# Patient Record
Sex: Male | Born: 1960
Health system: Southern US, Community
[De-identification: ages and names within clinical notes are randomized; demographics above are authoritative.]

## PROBLEM LIST (undated history)

## (undated) DIAGNOSIS — J449 Chronic obstructive pulmonary disease, unspecified: Secondary | ICD-10-CM

## (undated) DIAGNOSIS — I5032 Chronic diastolic (congestive) heart failure: Secondary | ICD-10-CM

## (undated) DIAGNOSIS — I219 Acute myocardial infarction, unspecified: Secondary | ICD-10-CM

## (undated) DIAGNOSIS — R519 Headache, unspecified: Secondary | ICD-10-CM

## (undated) DIAGNOSIS — R51 Headache: Secondary | ICD-10-CM

## (undated) DIAGNOSIS — I2510 Atherosclerotic heart disease of native coronary artery without angina pectoris: Secondary | ICD-10-CM

## (undated) DIAGNOSIS — M1990 Unspecified osteoarthritis, unspecified site: Secondary | ICD-10-CM

## (undated) DIAGNOSIS — R079 Chest pain, unspecified: Secondary | ICD-10-CM

## (undated) DIAGNOSIS — I82409 Acute embolism and thrombosis of unspecified deep veins of unspecified lower extremity: Secondary | ICD-10-CM

## (undated) DIAGNOSIS — C4492 Squamous cell carcinoma of skin, unspecified: Secondary | ICD-10-CM

## (undated) DIAGNOSIS — E7800 Pure hypercholesterolemia, unspecified: Secondary | ICD-10-CM

## (undated) DIAGNOSIS — B1920 Unspecified viral hepatitis C without hepatic coma: Secondary | ICD-10-CM

## (undated) DIAGNOSIS — F1410 Cocaine abuse, uncomplicated: Secondary | ICD-10-CM

## (undated) DIAGNOSIS — J189 Pneumonia, unspecified organism: Secondary | ICD-10-CM

## (undated) DIAGNOSIS — IMO0001 Reserved for inherently not codable concepts without codable children: Secondary | ICD-10-CM

## (undated) DIAGNOSIS — I10 Essential (primary) hypertension: Secondary | ICD-10-CM

## (undated) DIAGNOSIS — Z720 Tobacco use: Secondary | ICD-10-CM

## (undated) DIAGNOSIS — I2699 Other pulmonary embolism without acute cor pulmonale: Secondary | ICD-10-CM

## (undated) HISTORY — PX: VENA CAVA FILTER PLACEMENT: SUR1032

## (undated) HISTORY — DX: Squamous cell carcinoma of skin, unspecified: C44.92

## (undated) HISTORY — PX: FOOT SURGERY: SHX648

---

## 1999-10-18 ENCOUNTER — Emergency Department (HOSPITAL_COMMUNITY): Admission: EM | Admit: 1999-10-18 | Discharge: 1999-10-18 | Payer: Self-pay | Admitting: *Deleted

## 2000-07-03 ENCOUNTER — Inpatient Hospital Stay (HOSPITAL_COMMUNITY): Admission: EM | Admit: 2000-07-03 | Discharge: 2000-07-08 | Payer: Self-pay | Admitting: Emergency Medicine

## 2000-07-03 ENCOUNTER — Encounter (HOSPITAL_BASED_OUTPATIENT_CLINIC_OR_DEPARTMENT_OTHER): Payer: Self-pay | Admitting: Internal Medicine

## 2000-07-03 ENCOUNTER — Encounter: Payer: Self-pay | Admitting: Emergency Medicine

## 2000-07-04 ENCOUNTER — Encounter (HOSPITAL_BASED_OUTPATIENT_CLINIC_OR_DEPARTMENT_OTHER): Payer: Self-pay | Admitting: Internal Medicine

## 2000-10-04 ENCOUNTER — Encounter: Payer: Self-pay | Admitting: Internal Medicine

## 2000-10-04 ENCOUNTER — Inpatient Hospital Stay (HOSPITAL_COMMUNITY): Admission: EM | Admit: 2000-10-04 | Discharge: 2000-10-10 | Payer: Self-pay | Admitting: Internal Medicine

## 2000-10-04 ENCOUNTER — Encounter: Payer: Self-pay | Admitting: Emergency Medicine

## 2003-12-29 ENCOUNTER — Inpatient Hospital Stay (HOSPITAL_COMMUNITY): Admission: EM | Admit: 2003-12-29 | Discharge: 2003-12-31 | Payer: Self-pay | Admitting: Emergency Medicine

## 2004-06-23 ENCOUNTER — Emergency Department (HOSPITAL_COMMUNITY): Admission: EM | Admit: 2004-06-23 | Discharge: 2004-06-23 | Payer: Self-pay | Admitting: Emergency Medicine

## 2004-07-12 ENCOUNTER — Ambulatory Visit: Payer: Self-pay | Admitting: Internal Medicine

## 2004-07-12 ENCOUNTER — Ambulatory Visit: Payer: Self-pay | Admitting: Family Medicine

## 2004-07-13 ENCOUNTER — Ambulatory Visit: Payer: Self-pay | Admitting: *Deleted

## 2004-09-21 ENCOUNTER — Emergency Department (HOSPITAL_COMMUNITY): Admission: EM | Admit: 2004-09-21 | Discharge: 2004-09-21 | Payer: Self-pay | Admitting: Emergency Medicine

## 2004-09-28 ENCOUNTER — Ambulatory Visit: Payer: Self-pay | Admitting: Family Medicine

## 2004-09-28 ENCOUNTER — Inpatient Hospital Stay (HOSPITAL_COMMUNITY): Admission: EM | Admit: 2004-09-28 | Discharge: 2004-09-29 | Payer: Self-pay | Admitting: Emergency Medicine

## 2004-11-24 ENCOUNTER — Ambulatory Visit: Payer: Self-pay | Admitting: Family Medicine

## 2004-12-28 ENCOUNTER — Ambulatory Visit: Payer: Self-pay | Admitting: Family Medicine

## 2005-01-04 ENCOUNTER — Ambulatory Visit: Payer: Self-pay | Admitting: Family Medicine

## 2005-02-20 ENCOUNTER — Ambulatory Visit: Payer: Self-pay | Admitting: Family Medicine

## 2005-04-11 ENCOUNTER — Ambulatory Visit: Payer: Self-pay | Admitting: Internal Medicine

## 2005-08-18 ENCOUNTER — Ambulatory Visit: Payer: Self-pay | Admitting: Family Medicine

## 2006-02-07 ENCOUNTER — Ambulatory Visit: Payer: Self-pay | Admitting: Family Medicine

## 2006-05-28 ENCOUNTER — Ambulatory Visit: Payer: Self-pay | Admitting: Family Medicine

## 2006-09-30 ENCOUNTER — Emergency Department (HOSPITAL_COMMUNITY): Admission: EM | Admit: 2006-09-30 | Discharge: 2006-10-01 | Payer: Self-pay | Admitting: Emergency Medicine

## 2006-10-10 ENCOUNTER — Ambulatory Visit: Payer: Self-pay | Admitting: Family Medicine

## 2006-10-10 ENCOUNTER — Emergency Department (HOSPITAL_COMMUNITY): Admission: EM | Admit: 2006-10-10 | Discharge: 2006-10-11 | Payer: Self-pay | Admitting: Emergency Medicine

## 2006-10-15 ENCOUNTER — Ambulatory Visit: Payer: Self-pay | Admitting: Internal Medicine

## 2006-10-15 LAB — CONVERTED CEMR LAB
AST: 50 units/L — ABNORMAL HIGH (ref 0–37)
Albumin: 3.9 g/dL (ref 3.5–5.2)
Alkaline Phosphatase: 81 units/L (ref 39–117)
Basophils Absolute: 0 10*3/uL (ref 0.0–0.1)
CO2: 24 meq/L (ref 19–32)
Eosinophils Absolute: 0.4 10*3/uL (ref 0.0–0.7)
Eosinophils Relative: 8 % — ABNORMAL HIGH (ref 0–5)
Glucose, Bld: 80 mg/dL (ref 70–99)
Lymphocytes Relative: 28 % (ref 12–46)
Lymphs Abs: 1.4 10*3/uL (ref 0.7–3.3)
MCHC: 32.5 g/dL (ref 30.0–36.0)
Monocytes Absolute: 0.8 10*3/uL — ABNORMAL HIGH (ref 0.2–0.7)
Monocytes Relative: 17 % — ABNORMAL HIGH (ref 3–11)
Neutro Abs: 2.4 10*3/uL (ref 1.7–7.7)
Neutrophils Relative %: 48 % (ref 43–77)
Platelets: 152 10*3/uL (ref 150–400)
RDW: 14.8 % — ABNORMAL HIGH (ref 11.5–14.0)
Total Protein: 6.9 g/dL (ref 6.0–8.3)

## 2006-10-22 ENCOUNTER — Emergency Department (HOSPITAL_COMMUNITY): Admission: EM | Admit: 2006-10-22 | Discharge: 2006-10-22 | Payer: Self-pay | Admitting: Emergency Medicine

## 2006-10-22 ENCOUNTER — Ambulatory Visit: Payer: Self-pay | Admitting: Vascular Surgery

## 2006-10-29 ENCOUNTER — Emergency Department (HOSPITAL_COMMUNITY): Admission: EM | Admit: 2006-10-29 | Discharge: 2006-10-29 | Payer: Self-pay | Admitting: Emergency Medicine

## 2006-11-02 ENCOUNTER — Ambulatory Visit: Payer: Self-pay | Admitting: Family Medicine

## 2006-11-07 ENCOUNTER — Ambulatory Visit: Payer: Self-pay | Admitting: Internal Medicine

## 2006-11-13 ENCOUNTER — Ambulatory Visit: Payer: Self-pay | Admitting: Internal Medicine

## 2006-11-27 ENCOUNTER — Ambulatory Visit: Payer: Self-pay | Admitting: Internal Medicine

## 2006-11-28 ENCOUNTER — Encounter: Payer: Self-pay | Admitting: Internal Medicine

## 2006-11-28 ENCOUNTER — Ambulatory Visit: Payer: Self-pay | Admitting: Surgery

## 2006-11-28 ENCOUNTER — Emergency Department (HOSPITAL_COMMUNITY): Admission: EM | Admit: 2006-11-28 | Discharge: 2006-11-28 | Payer: Self-pay | Admitting: *Deleted

## 2006-11-28 ENCOUNTER — Ambulatory Visit (HOSPITAL_COMMUNITY): Admission: RE | Admit: 2006-11-28 | Discharge: 2006-11-28 | Payer: Self-pay | Admitting: Internal Medicine

## 2006-12-12 ENCOUNTER — Encounter (INDEPENDENT_AMBULATORY_CARE_PROVIDER_SITE_OTHER): Payer: Self-pay | Admitting: *Deleted

## 2007-01-10 ENCOUNTER — Ambulatory Visit: Payer: Self-pay | Admitting: Internal Medicine

## 2007-02-08 ENCOUNTER — Ambulatory Visit: Payer: Self-pay | Admitting: Internal Medicine

## 2007-04-04 ENCOUNTER — Encounter (INDEPENDENT_AMBULATORY_CARE_PROVIDER_SITE_OTHER): Payer: Self-pay | Admitting: Emergency Medicine

## 2007-04-04 ENCOUNTER — Ambulatory Visit: Payer: Self-pay | Admitting: *Deleted

## 2007-04-04 ENCOUNTER — Inpatient Hospital Stay (HOSPITAL_COMMUNITY): Admission: EM | Admit: 2007-04-04 | Discharge: 2007-04-09 | Payer: Self-pay | Admitting: Emergency Medicine

## 2007-04-30 ENCOUNTER — Ambulatory Visit: Payer: Self-pay | Admitting: Internal Medicine

## 2007-04-30 ENCOUNTER — Encounter: Payer: Self-pay | Admitting: Internal Medicine

## 2007-04-30 LAB — CONVERTED CEMR LAB
AST: 38 units/L — ABNORMAL HIGH (ref 0–37)
Albumin: 4 g/dL (ref 3.5–5.2)
BUN: 10 mg/dL (ref 6–23)
Basophils Absolute: 0 10*3/uL (ref 0.0–0.1)
Basophils Relative: 0 % (ref 0–1)
Calcium: 9.1 mg/dL (ref 8.4–10.5)
Chloride: 104 meq/L (ref 96–112)
Cholesterol: 242 mg/dL — ABNORMAL HIGH (ref 0–200)
Creatinine, Ser: 0.73 mg/dL (ref 0.40–1.50)
Eosinophils Relative: 6 % — ABNORMAL HIGH (ref 0–5)
Lymphocytes Relative: 24 % (ref 12–46)
MCV: 91.2 fL (ref 78.0–100.0)
Monocytes Absolute: 0.8 10*3/uL (ref 0.1–1.0)
Platelets: 151 10*3/uL (ref 150–400)
Total Bilirubin: 0.5 mg/dL (ref 0.3–1.2)
Total CHOL/HDL Ratio: 4.8
Triglycerides: 101 mg/dL (ref <150)
VLDL: 20 mg/dL (ref 0–40)
WBC: 3.3 10*3/uL — ABNORMAL LOW (ref 4.0–10.5)

## 2007-05-02 ENCOUNTER — Emergency Department (HOSPITAL_COMMUNITY): Admission: EM | Admit: 2007-05-02 | Discharge: 2007-05-02 | Payer: Self-pay | Admitting: Emergency Medicine

## 2007-06-04 ENCOUNTER — Emergency Department (HOSPITAL_COMMUNITY): Admission: EM | Admit: 2007-06-04 | Discharge: 2007-06-04 | Payer: Self-pay | Admitting: Cardiovascular Disease

## 2007-06-12 ENCOUNTER — Ambulatory Visit: Payer: Self-pay | Admitting: Family Medicine

## 2007-06-24 ENCOUNTER — Ambulatory Visit: Payer: Self-pay | Admitting: Internal Medicine

## 2007-07-01 ENCOUNTER — Ambulatory Visit: Payer: Self-pay | Admitting: Internal Medicine

## 2007-07-16 ENCOUNTER — Ambulatory Visit: Payer: Self-pay | Admitting: Internal Medicine

## 2007-08-12 ENCOUNTER — Ambulatory Visit: Payer: Self-pay | Admitting: Internal Medicine

## 2007-08-21 ENCOUNTER — Ambulatory Visit: Payer: Self-pay | Admitting: Internal Medicine

## 2007-08-24 ENCOUNTER — Emergency Department (HOSPITAL_COMMUNITY): Admission: EM | Admit: 2007-08-24 | Discharge: 2007-08-24 | Payer: Self-pay | Admitting: Emergency Medicine

## 2007-08-30 ENCOUNTER — Emergency Department (HOSPITAL_COMMUNITY): Admission: EM | Admit: 2007-08-30 | Discharge: 2007-08-31 | Payer: Self-pay | Admitting: Emergency Medicine

## 2007-10-20 ENCOUNTER — Emergency Department (HOSPITAL_COMMUNITY): Admission: EM | Admit: 2007-10-20 | Discharge: 2007-10-20 | Payer: Self-pay | Admitting: Emergency Medicine

## 2008-02-14 ENCOUNTER — Emergency Department (HOSPITAL_COMMUNITY): Admission: EM | Admit: 2008-02-14 | Discharge: 2008-02-14 | Payer: Self-pay | Admitting: Emergency Medicine

## 2008-03-08 ENCOUNTER — Emergency Department (HOSPITAL_COMMUNITY): Admission: EM | Admit: 2008-03-08 | Discharge: 2008-03-08 | Payer: Self-pay | Admitting: Emergency Medicine

## 2008-03-30 ENCOUNTER — Emergency Department (HOSPITAL_COMMUNITY): Admission: EM | Admit: 2008-03-30 | Discharge: 2008-03-30 | Payer: Self-pay | Admitting: Emergency Medicine

## 2008-04-27 ENCOUNTER — Emergency Department (HOSPITAL_COMMUNITY): Admission: EM | Admit: 2008-04-27 | Discharge: 2008-04-27 | Payer: Self-pay | Admitting: Emergency Medicine

## 2008-05-04 ENCOUNTER — Emergency Department (HOSPITAL_COMMUNITY): Admission: EM | Admit: 2008-05-04 | Discharge: 2008-05-04 | Payer: Self-pay | Admitting: Emergency Medicine

## 2008-05-10 ENCOUNTER — Emergency Department (HOSPITAL_COMMUNITY): Admission: EM | Admit: 2008-05-10 | Discharge: 2008-05-10 | Payer: Self-pay | Admitting: Emergency Medicine

## 2008-05-15 ENCOUNTER — Observation Stay (HOSPITAL_COMMUNITY): Admission: EM | Admit: 2008-05-15 | Discharge: 2008-05-15 | Payer: Self-pay | Admitting: Emergency Medicine

## 2008-07-12 ENCOUNTER — Observation Stay (HOSPITAL_COMMUNITY): Admission: EM | Admit: 2008-07-12 | Discharge: 2008-07-12 | Payer: Self-pay | Admitting: Emergency Medicine

## 2008-11-23 ENCOUNTER — Emergency Department (HOSPITAL_COMMUNITY): Admission: EM | Admit: 2008-11-23 | Discharge: 2008-11-23 | Payer: Self-pay | Admitting: Emergency Medicine

## 2008-12-03 ENCOUNTER — Inpatient Hospital Stay (HOSPITAL_COMMUNITY): Admission: EM | Admit: 2008-12-03 | Discharge: 2008-12-04 | Payer: Self-pay | Admitting: Emergency Medicine

## 2008-12-18 ENCOUNTER — Emergency Department (HOSPITAL_COMMUNITY): Admission: EM | Admit: 2008-12-18 | Discharge: 2008-12-18 | Payer: Self-pay | Admitting: Emergency Medicine

## 2008-12-27 ENCOUNTER — Emergency Department (HOSPITAL_COMMUNITY): Admission: EM | Admit: 2008-12-27 | Discharge: 2008-12-27 | Payer: Self-pay | Admitting: Emergency Medicine

## 2009-03-11 ENCOUNTER — Inpatient Hospital Stay (HOSPITAL_COMMUNITY): Admission: EM | Admit: 2009-03-11 | Discharge: 2009-03-15 | Payer: Self-pay | Admitting: Emergency Medicine

## 2009-05-20 ENCOUNTER — Emergency Department (HOSPITAL_COMMUNITY): Admission: EM | Admit: 2009-05-20 | Discharge: 2009-05-20 | Payer: Self-pay | Admitting: Emergency Medicine

## 2009-06-03 ENCOUNTER — Inpatient Hospital Stay (HOSPITAL_COMMUNITY): Admission: EM | Admit: 2009-06-03 | Discharge: 2009-06-09 | Payer: Self-pay | Admitting: Emergency Medicine

## 2009-07-05 ENCOUNTER — Inpatient Hospital Stay (HOSPITAL_COMMUNITY): Admission: EM | Admit: 2009-07-05 | Discharge: 2009-07-08 | Payer: Self-pay | Admitting: Emergency Medicine

## 2009-09-28 ENCOUNTER — Inpatient Hospital Stay (HOSPITAL_COMMUNITY): Admission: EM | Admit: 2009-09-28 | Discharge: 2009-10-02 | Payer: Self-pay | Admitting: Emergency Medicine

## 2009-12-15 ENCOUNTER — Ambulatory Visit: Payer: Self-pay | Admitting: Cardiology

## 2009-12-16 ENCOUNTER — Inpatient Hospital Stay (HOSPITAL_COMMUNITY): Admission: EM | Admit: 2009-12-16 | Discharge: 2009-12-19 | Payer: Self-pay | Admitting: Emergency Medicine

## 2010-01-10 ENCOUNTER — Emergency Department (HOSPITAL_COMMUNITY)
Admission: EM | Admit: 2010-01-10 | Discharge: 2010-01-10 | Payer: Self-pay | Source: Home / Self Care | Admitting: Emergency Medicine

## 2010-01-10 ENCOUNTER — Ambulatory Visit: Payer: Self-pay | Admitting: Vascular Surgery

## 2010-01-10 ENCOUNTER — Encounter (INDEPENDENT_AMBULATORY_CARE_PROVIDER_SITE_OTHER): Payer: Self-pay | Admitting: Emergency Medicine

## 2010-01-14 ENCOUNTER — Ambulatory Visit: Payer: Self-pay | Admitting: Internal Medicine

## 2010-01-17 ENCOUNTER — Encounter (INDEPENDENT_AMBULATORY_CARE_PROVIDER_SITE_OTHER): Payer: Self-pay | Admitting: Family Medicine

## 2010-01-17 LAB — CONVERTED CEMR LAB
INR: 1.22 (ref <1.50)
Prothrombin Time: 15.6 s — ABNORMAL HIGH (ref 11.6–15.2)

## 2010-02-05 ENCOUNTER — Emergency Department (HOSPITAL_COMMUNITY): Admission: EM | Admit: 2010-02-05 | Discharge: 2010-02-05 | Payer: Self-pay | Admitting: Emergency Medicine

## 2010-02-18 ENCOUNTER — Inpatient Hospital Stay (HOSPITAL_COMMUNITY): Admission: EM | Admit: 2010-02-18 | Discharge: 2010-02-20 | Payer: Self-pay | Admitting: Emergency Medicine

## 2010-03-24 ENCOUNTER — Inpatient Hospital Stay (HOSPITAL_COMMUNITY)
Admission: EM | Admit: 2010-03-24 | Discharge: 2010-03-29 | Payer: Self-pay | Source: Home / Self Care | Attending: Internal Medicine | Admitting: Internal Medicine

## 2010-03-24 ENCOUNTER — Encounter (INDEPENDENT_AMBULATORY_CARE_PROVIDER_SITE_OTHER): Payer: Self-pay | Admitting: Emergency Medicine

## 2010-04-24 ENCOUNTER — Inpatient Hospital Stay (HOSPITAL_COMMUNITY)
Admission: EM | Admit: 2010-04-24 | Discharge: 2010-04-28 | DRG: 192 | Disposition: A | Discharge status: Home / Self Care | Payer: Medicaid Other | Attending: Internal Medicine | Admitting: Internal Medicine

## 2010-04-24 DIAGNOSIS — R7309 Other abnormal glucose: Secondary | ICD-10-CM | POA: Diagnosis present

## 2010-04-24 DIAGNOSIS — Z86718 Personal history of other venous thrombosis and embolism: Secondary | ICD-10-CM

## 2010-04-24 DIAGNOSIS — F17200 Nicotine dependence, unspecified, uncomplicated: Secondary | ICD-10-CM | POA: Diagnosis present

## 2010-04-24 DIAGNOSIS — IMO0002 Reserved for concepts with insufficient information to code with codable children: Secondary | ICD-10-CM

## 2010-04-24 DIAGNOSIS — T380X5A Adverse effect of glucocorticoids and synthetic analogues, initial encounter: Secondary | ICD-10-CM | POA: Diagnosis present

## 2010-04-24 DIAGNOSIS — J471 Bronchiectasis with (acute) exacerbation: Principal | ICD-10-CM | POA: Diagnosis present

## 2010-04-24 DIAGNOSIS — Z79899 Other long term (current) drug therapy: Secondary | ICD-10-CM

## 2010-04-24 DIAGNOSIS — Y92009 Unspecified place in unspecified non-institutional (private) residence as the place of occurrence of the external cause: Secondary | ICD-10-CM

## 2010-04-24 DIAGNOSIS — Z7901 Long term (current) use of anticoagulants: Secondary | ICD-10-CM

## 2010-04-24 DIAGNOSIS — Z7982 Long term (current) use of aspirin: Secondary | ICD-10-CM

## 2010-04-24 DIAGNOSIS — Z9119 Patient's noncompliance with other medical treatment and regimen: Secondary | ICD-10-CM

## 2010-04-24 DIAGNOSIS — Z91199 Patient's noncompliance with other medical treatment and regimen due to unspecified reason: Secondary | ICD-10-CM

## 2010-04-24 LAB — BASIC METABOLIC PANEL
BUN: 9 mg/dL (ref 6–23)
CO2: 25 mEq/L (ref 19–32)
Calcium: 9.3 mg/dL (ref 8.4–10.5)
Chloride: 109 mEq/L (ref 96–112)
Creatinine, Ser: 1.09 mg/dL (ref 0.4–1.5)
GFR calc Af Amer: >60 mL/min (ref >60)
GFR calc non Af Amer: >60 mL/min (ref >60)
Glucose, Bld: 122 mg/dL — ABNORMAL HIGH (ref 70–99)
Potassium: 4 mEq/L (ref 3.5–5.1)
Sodium: 143 mEq/L (ref 135–145)

## 2010-04-24 LAB — CBC
HCT: 40.4 % (ref 39.0–52.0)
Platelets: 169 10*3/uL (ref 150–400)
RDW: 12.8 % (ref 11.5–15.5)
WBC: 4.3 10*3/uL (ref 4.0–10.5)

## 2010-04-24 LAB — APTT: aPTT: 25 seconds (ref 24–37)

## 2010-04-24 LAB — DIFFERENTIAL
Basophils Absolute: 0 10*3/uL (ref 0.0–0.1)
Eosinophils Relative: 10 % — ABNORMAL HIGH (ref 0–5)
Lymphocytes Relative: 36 % (ref 12–46)

## 2010-04-24 LAB — PROTIME-INR
INR: 0.94 (ref 0.00–1.49)
Prothrombin Time: 12.8 seconds (ref 11.6–15.2)

## 2010-04-25 LAB — COMPREHENSIVE METABOLIC PANEL
ALT: 33 U/L (ref 0–53)
AST: 46 U/L — ABNORMAL HIGH (ref 0–37)
Alkaline Phosphatase: 84 U/L (ref 39–117)
CO2: 20 mEq/L (ref 19–32)
Calcium: 9.1 mg/dL (ref 8.4–10.5)
Chloride: 106 mEq/L (ref 96–112)
GFR calc Af Amer: >60 mL/min (ref >60)
GFR calc non Af Amer: >60 mL/min (ref >60)
Potassium: 3.7 mEq/L (ref 3.5–5.1)
Sodium: 139 mEq/L (ref 135–145)

## 2010-04-25 LAB — BLOOD GAS, ARTERIAL
Allens test (pass/fail): POSITIVE — AB
Bicarbonate: 21.2 mEq/L (ref 20.0–24.0)
FIO2: 0.21 %
pH, Arterial: 7.397 (ref 7.350–7.450)
pO2, Arterial: 71.9 mmHg — ABNORMAL LOW (ref 80.0–100.0)

## 2010-04-25 LAB — CBC
Hemoglobin: 13.1 g/dL (ref 13.0–17.0)
MCHC: 33.4 g/dL (ref 30.0–36.0)
RBC: 4.37 MIL/uL (ref 4.22–5.81)
WBC: 3.3 10*3/uL — ABNORMAL LOW (ref 4.0–10.5)

## 2010-04-25 LAB — RAPID URINE DRUG SCREEN, HOSP PERFORMED: Benzodiazepines: NOT DETECTED

## 2010-04-26 LAB — PROTIME-INR
INR: 0.96 (ref 0.00–1.49)
Prothrombin Time: 13 seconds (ref 11.6–15.2)

## 2010-04-26 LAB — GLUCOSE, CAPILLARY: Glucose-Capillary: 231 mg/dL — ABNORMAL HIGH (ref 70–99)

## 2010-04-27 LAB — GLUCOSE, CAPILLARY
Glucose-Capillary: 214 mg/dL — ABNORMAL HIGH (ref 70–99)
Glucose-Capillary: 170 mg/dL — ABNORMAL HIGH (ref 70–99)

## 2010-04-28 LAB — GLUCOSE, CAPILLARY: Glucose-Capillary: 214 mg/dL — ABNORMAL HIGH (ref 70–99)

## 2010-04-28 LAB — PROTIME-INR: Prothrombin Time: 17.3 seconds — ABNORMAL HIGH (ref 11.6–15.2)

## 2010-04-30 NOTE — Discharge Summary (Signed)
[name], [name]             ACCOUNT NO.:  [id number]  MEDICAL RECORD NO.:  [id number]           PATIENT TYPE:  I  LOCATION:  5525                         FACILITY:  [facility]  PHYSICIAN:  Charlestine Massed, MDDATE OF BIRTH:  01/29/1961  DATE OF ADMISSION:  04/24/2010 DATE OF DISCHARGE:  04/28/2010                        DISCHARGE SUMMARY - REFERRING   PRIMARY CARE PHYSICIAN:  Dr. Clelia Croft at [facility].  CHIEF COMPLAINT:  Shortness of breath.  DISCHARGE DIAGNOSES: 1. Chronic obstructive pulmonary disease exacerbation - currently     stable. 2. History of recurrent DVT - totally noncompliant with Coumadin -     status post IVC filter. 3. Hyperglycemia due to steroid. 4. Bilateral basal bronchiectasis.  DISCHARGE MEDICATIONS: 1. Albuterol nebulizer 1 unit q.6 hourly p.r.n. along with Atrovent     nebulizer 1 unit q.6 hourly p.r.n. 2. Enteric-coated aspirin 81 mg p.o. daily. 3. Vantin 200 mg p.o. b.i.d. for 12 more days. 4. Ciprofloxacin 500 mg p.o. b.i.d. for 12 more days. 5. Lovenox 85 mg q.12 hourly for another 4 days with bridging with     Coumadin. 6. Coumadin 10 mg p.o. daily and to be taken until the dosage is     changed by the MD. 7. Mucinex 600 mg p.o. at b.i.d. 8. Prilosec 20 mg p.o. daily in a.m. 9. Prednisone tapering dose over the next few days to a basal dose of     10 mg per day. 10.Advair Diskus 250/50 one puff b.i.d. 11.Albuterol inhaler for outdoor use.  HOSPITAL COURSE:  [name] is a 50 year old gentleman who has been totally noncompliant with his Coumadin for his recurrent DVT.  He has been admitted for COPD exacerbation.  He has existing bilateral basal bronchiectasis.  Even though, he denies very much that he is not smoking anymore, but he has a significant past history of smoking too.  He was observed closely with nebulizers and steroids.  His condition has improved considerably.  He has been restarted on his medications  with steroids and nebulizers.  He has been started on Lovenox and Coumadin. He has agreed that he will take his medications regularly.  We have started him on ciprofloxacin for Pseudomonas coverage due to bronchiectasis and Vantin to be continued for a total of 14-day course to eliminate the infection due to the presence of bronchiectasis.  We have ensured that he will get home care with nebulizers supplied with treatment which will prevent further rehospitalization for the same issues.  He is currently doing a lot better and ambulating in the hallway without oxygen and he is found to be not desaturating and he is ready for discharge at this time.  DISPOSITION:  Discharged back home, followup.  OBJECTIVE:  GENERAL:  On exam today, not in any distress, feeling a lot better, afebrile.  The patient is awake and alert. VITAL SIGNS: Temperature 98.0, heart rate 69, respiration 20, blood pressure 149/97, O2 sat 98% on room air. HEAD AND NECK:  No JVD.  No bruit.  No thrush. CHEST:  Bilateral harsh breath sounds posteriorly and coarse rales on the right side, more on the left side.  No wheeze.  CARDIAC:  S1 and S2, regular.  No murmurs. ABDOMEN:  Soft, nontender.  No organomegaly. EXTREMITIES:  No pedal edema.  LABORATORY DATA:  INR today is 1.39.  Hemoglobin A1c is 5.9.  Urine drug screen at the time of admission negative for all drugs tested.  ASSESSMENT AND PLAN:  As dictated above.  A total of 40 minutes spent on the discharge.     Charlestine Massed, MD     UT/MEDQ  D:  04/28/2010  T:  04/28/2010  Job:  [id number]  cc:   Dr. Clelia Croft  Electronically Signed by Charlestine Massed MD on 04/30/2010 11:21:30 AM

## 2010-06-02 NOTE — H&P (Signed)
NAMEWILBUR, [name]             ACCOUNT NO.:  [id number]  MEDICAL RECORD NO.:  [id number]          PATIENT TYPE:  EMS  LOCATION:  MAJO                         FACILITY:  [facility]  PHYSICIAN:  Lonia Blood, M.D.      DATE OF BIRTH:  [birth year]  DATE OF ADMISSION:  04/24/2010 DATE OF DISCHARGE:                             HISTORY & PHYSICAL   PRIMARY CARE PHYSICIAN:  [name] goes to [facility]  PRESENTING COMPLAINT:  Shortness of breath.  HISTORY OF PRESENT ILLNESS:  The patient is a 50 year old gentleman with known history of asthma for the past 15 years who has had multiple hospitalizations almost every month.  His last hospitalization was from March 24, 2010, to March 30, 2010.  [name] came in today with worsening shortness of breath.  [name] apparently has been out of his medications.  [name] takes Coumadin also that [name] has been out of for 2 days.  The patient denied any recent cold.  Denied any fever.  [name] has cough associated withthe shortness of breath otherwise.  The patient also has had some dizziness or lightheadedness during his coughing spells.  No sick contacts.  PAST MEDICAL HISTORY:  Asthma and recurrent DVTs.  ALLERGIES:  No known drug allergies.  MEDICATIONS:  Coumadin which [name] ran out of, albuterol, and prednisone.  SOCIAL HISTORY:  The patient lives in [place].  [name] is disabled.  [name] denied tobacco.  Occasional alcohol.  Denied any IV drug use.  FAMILY HISTORY:  Denied any significant family history.  PAST SURGICAL HISTORY:  Placement of IVC filter.  REVIEW OF SYSTEMS:  All systems are reviewed and are negative except per HPI.  PHYSICAL EXAMINATION:  VITAL SIGNS:  Temperature 98.1, blood pressure 114/85 with a pulse of 86, respiratory rate 16, and sats 94% on 2 liters. GENERAL:  [name] is awake, alert, and oriented, in obvious respiratory distress, using extra muscle of respiration. HEENT:  PERRL.  EOMI.  No pallor.  No jaundice.  No rhinorrhea. NECK:  Supple.  No  JVD.  No lymphadenopathy. RESPIRATORY:  [name] has good air entry bilaterally.  No wheezes.  No rales. No crackles. CARDIOVASCULAR:  [name] has S1 and S2.  No audible murmur. ABDOMEN:  Soft, full, and nontender with positive bowel sounds. EXTREMITIES:  No edema, cyanosis, or clubbing. SKIN:  No rashes.  No ulcers. MUSCULOSKELETAL:  No joint swelling, tenderness, or deformity.  LABORATORY DATA:  Sodium of 143, potassium 4.0, chloride 109, CO2 of 25, glucose 122, BUN 9, creatinine 1.09, and calcium 9.3.  His PTT is 25, PT 12.8, and INR 0.94.  White count 4.3, hemoglobin 13.5, and platelet 169. His chest x-ray showed chronic bronchitic change without acute cardiopulmonary disease.  ASSESSMENT:  This is a 50 year old gentleman with known history of asthma presenting with what appears to be acute eczema exacerbation.  [name] is wheezing continuously despite 3 treatments with albuterol and Atrovent.  Our plan at this point will be: 1. Asthma exacerbation:  We will admit the patient and start him on IV     steroids and albuterol and Atrovent nebulizer.  Also put him on     some  Mucinex for cough suppression.  [name] will be on oxygen and     follow up closely.  For the first 24 hours, we will put him on tele     and subsequently regular.  The patient seems to be noncompliant     with his medication based on his history.  [name] will need to be     transitioned to oral steroids and also continued with Advair Diskus     which [name] has not been taking. 2. History of recurrent DVT:  Again, this is on and off.  [name] is off     his Coumadin and is subtherapeutic.  I will restart his Coumadin     and probably do some heparin as a backup for bridging.  Low-     molecular-weight heparin may be the best option here using Lovenox     and this is due to his multiple recurrent DVTs.  [name] has had at     least 5 episodes in the past. 3. Hyperglycemia, probably from prior steroid use: 4. History of bronchiectasis from prior CT  scan:  This may be     contributing to the patient's exacerbation.     Lonia Blood, M.D.     Verlin Grills  D:  04/25/2010  T:  04/25/2010  Job:  [id number]  Electronically Signed by Lonia Blood M.D. on 06/01/2010 04:15:48 PM

## 2010-06-06 LAB — GLUCOSE, CAPILLARY
Glucose-Capillary: 105 mg/dL — ABNORMAL HIGH (ref 70–99)
Glucose-Capillary: 105 mg/dL — ABNORMAL HIGH (ref 70–99)
Glucose-Capillary: 115 mg/dL — ABNORMAL HIGH (ref 70–99)
Glucose-Capillary: 119 mg/dL — ABNORMAL HIGH (ref 70–99)
Glucose-Capillary: 140 mg/dL — ABNORMAL HIGH (ref 70–99)
Glucose-Capillary: 158 mg/dL — ABNORMAL HIGH (ref 70–99)
Glucose-Capillary: 233 mg/dL — ABNORMAL HIGH (ref 70–99)
Glucose-Capillary: 91 mg/dL (ref 70–99)
Glucose-Capillary: 93 mg/dL (ref 70–99)
Glucose-Capillary: 94 mg/dL (ref 70–99)

## 2010-06-06 LAB — CBC
HCT: 40 % (ref 39.0–52.0)
MCH: 30.8 pg (ref 26.0–34.0)
MCHC: 33.3 g/dL (ref 30.0–36.0)
MCHC: 33.5 g/dL (ref 30.0–36.0)
MCV: 93.7 fL (ref 78.0–100.0)
Platelets: 186 10*3/uL (ref 150–400)
RDW: 13.9 % (ref 11.5–15.5)
RDW: 14.4 % (ref 11.5–15.5)

## 2010-06-06 LAB — COMPREHENSIVE METABOLIC PANEL
AST: 33 U/L (ref 0–37)
Albumin: 3.2 g/dL — ABNORMAL LOW (ref 3.5–5.2)
CO2: 23 mEq/L (ref 19–32)
Calcium: 9.3 mg/dL (ref 8.4–10.5)
Creatinine, Ser: 0.86 mg/dL (ref 0.4–1.5)
GFR calc Af Amer: >60 mL/min (ref >60)
GFR calc non Af Amer: >60 mL/min (ref >60)
Total Protein: 6.7 g/dL (ref 6.0–8.3)

## 2010-06-06 LAB — PROTIME-INR
INR: 0.95 (ref 0.00–1.49)
INR: 1.01 (ref 0.00–1.49)
INR: 1.14 (ref 0.00–1.49)
INR: 1.55 — ABNORMAL HIGH (ref 0.00–1.49)
Prothrombin Time: 14.8 seconds (ref 11.6–15.2)
Prothrombin Time: 20.2 seconds — ABNORMAL HIGH (ref 11.6–15.2)

## 2010-06-06 LAB — D-DIMER, QUANTITATIVE: D-Dimer, Quant: 1.74 ug/mL-FEU — ABNORMAL HIGH (ref 0.00–0.48)

## 2010-06-06 LAB — POCT I-STAT, CHEM 8
HCT: 48 % (ref 39.0–52.0)
Hemoglobin: 16.3 g/dL (ref 13.0–17.0)
Sodium: 139 mEq/L (ref 135–145)
TCO2: 25 mmol/L (ref 0–100)

## 2010-06-06 LAB — EXPECTORATED SPUTUM ASSESSMENT W GRAM STAIN, RFLX TO RESP C

## 2010-06-07 LAB — CBC
HCT: 38.3 % — ABNORMAL LOW (ref 39.0–52.0)
HCT: 41.7 % (ref 39.0–52.0)
HCT: 43.2 % (ref 39.0–52.0)
Hemoglobin: 12.6 g/dL — ABNORMAL LOW (ref 13.0–17.0)
Hemoglobin: 13.1 g/dL (ref 13.0–17.0)
Hemoglobin: 14.9 g/dL (ref 13.0–17.0)
MCH: 31.7 pg (ref 26.0–34.0)
MCH: 31.9 pg (ref 26.0–34.0)
MCH: 31.9 pg (ref 26.0–34.0)
MCHC: 34.2 g/dL (ref 30.0–36.0)
MCV: 92.3 fL (ref 78.0–100.0)
MCV: 93.1 fL (ref 78.0–100.0)
MCV: 93.5 fL (ref 78.0–100.0)
MCV: 94.1 fL (ref 78.0–100.0)
Platelets: 173 10*3/uL (ref 150–400)
Platelets: 196 10*3/uL (ref 150–400)
Platelets: 199 10*3/uL (ref 150–400)
RBC: 3.98 MIL/uL — ABNORMAL LOW (ref 4.22–5.81)
RBC: 4.46 MIL/uL (ref 4.22–5.81)
RBC: 4.68 MIL/uL (ref 4.22–5.81)
RDW: 13.9 % (ref 11.5–15.5)
RDW: 14.4 % (ref 11.5–15.5)
WBC: 3.9 10*3/uL — ABNORMAL LOW (ref 4.0–10.5)
WBC: 4.2 10*3/uL (ref 4.0–10.5)
WBC: 4.8 10*3/uL (ref 4.0–10.5)

## 2010-06-07 LAB — BASIC METABOLIC PANEL
BUN: 5 mg/dL — ABNORMAL LOW (ref 6–23)
CO2: 19 mEq/L (ref 19–32)
CO2: 23 mEq/L (ref 19–32)
CO2: 25 mEq/L (ref 19–32)
Chloride: 109 mEq/L (ref 96–112)
Chloride: 109 mEq/L (ref 96–112)
Chloride: 111 mEq/L (ref 96–112)
Creatinine, Ser: 0.98 mg/dL (ref 0.4–1.5)
Creatinine, Ser: 1.18 mg/dL (ref 0.4–1.5)
GFR calc Af Amer: >60 mL/min (ref >60)
GFR calc non Af Amer: >60 mL/min (ref >60)
Glucose, Bld: 211 mg/dL — ABNORMAL HIGH (ref 70–99)
Glucose, Bld: 93 mg/dL (ref 70–99)
Potassium: 4 mEq/L (ref 3.5–5.1)
Potassium: 4.4 mEq/L (ref 3.5–5.1)
Sodium: 140 mEq/L (ref 135–145)
Sodium: 142 mEq/L (ref 135–145)
Sodium: 143 mEq/L (ref 135–145)

## 2010-06-07 LAB — DIFFERENTIAL
Basophils Absolute: 0 10*3/uL (ref 0.0–0.1)
Eosinophils Absolute: 0.4 10*3/uL (ref 0.0–0.7)
Eosinophils Relative: 10 % — ABNORMAL HIGH (ref 0–5)
Eosinophils Relative: 19 % — ABNORMAL HIGH (ref 0–5)
Lymphocytes Relative: 21 % (ref 12–46)
Lymphocytes Relative: 30 % (ref 12–46)
Lymphs Abs: 1 10*3/uL (ref 0.7–4.0)
Lymphs Abs: 1.3 10*3/uL (ref 0.7–4.0)
Monocytes Relative: 17 % — ABNORMAL HIGH (ref 3–12)
Neutro Abs: 2.4 10*3/uL (ref 1.7–7.7)

## 2010-06-07 LAB — D-DIMER, QUANTITATIVE: D-Dimer, Quant: 0.66 ug/mL-FEU — ABNORMAL HIGH (ref 0.00–0.48)

## 2010-06-07 LAB — PROTEIN S, TOTAL: Protein S Ag, Total: 45 % — ABNORMAL LOW (ref 70–140)

## 2010-06-07 LAB — PROTIME-INR
INR: 1.1 (ref 0.00–1.49)
Prothrombin Time: 17 seconds — ABNORMAL HIGH (ref 11.6–15.2)

## 2010-06-07 LAB — POCT CARDIAC MARKERS: Myoglobin, poc: 219 ng/mL (ref 12–200)

## 2010-06-07 LAB — APC RESISTANCE: Activated Protein C Resistance: 4.5 ratio (ref >2.1)

## 2010-06-07 LAB — PROTHROMBIN GENE MUTATION

## 2010-06-08 LAB — BASIC METABOLIC PANEL
BUN: 12 mg/dL (ref 6–23)
CO2: 24 mEq/L (ref 19–32)
Chloride: 106 mEq/L (ref 96–112)
GFR calc Af Amer: >60 mL/min (ref >60)
Potassium: 4 mEq/L (ref 3.5–5.1)

## 2010-06-08 LAB — CBC
HCT: 40.3 % (ref 39.0–52.0)
MCH: 31 pg (ref 26.0–34.0)
MCV: 93.3 fL (ref 78.0–100.0)
RBC: 4.32 MIL/uL (ref 4.22–5.81)
WBC: 5.1 10*3/uL (ref 4.0–10.5)

## 2010-06-08 LAB — DIFFERENTIAL
Eosinophils Absolute: 0.2 10*3/uL (ref 0.0–0.7)
Eosinophils Relative: 4 % (ref 0–5)
Lymphocytes Relative: 30 % (ref 12–46)
Lymphs Abs: 1.5 10*3/uL (ref 0.7–4.0)
Monocytes Absolute: 0.7 10*3/uL (ref 0.1–1.0)

## 2010-06-09 LAB — D-DIMER, QUANTITATIVE: D-Dimer, Quant: 1.81 ug/mL-FEU — ABNORMAL HIGH (ref 0.00–0.48)

## 2010-06-09 LAB — DIFFERENTIAL
Basophils Absolute: 0 10*3/uL (ref 0.0–0.1)
Basophils Absolute: 0 10*3/uL (ref 0.0–0.1)
Lymphocytes Relative: 12 % (ref 12–46)
Lymphocytes Relative: 33 % (ref 12–46)
Monocytes Absolute: 0.6 10*3/uL (ref 0.1–1.0)
Monocytes Relative: 13 % — ABNORMAL HIGH (ref 3–12)
Neutro Abs: 1.8 10*3/uL (ref 1.7–7.7)
Neutro Abs: 2.6 10*3/uL (ref 1.7–7.7)
Neutrophils Relative %: 42 % — ABNORMAL LOW (ref 43–77)

## 2010-06-09 LAB — LIPID PANEL
HDL: 58 mg/dL (ref >39)
Total CHOL/HDL Ratio: 2.9 RATIO
VLDL: 8 mg/dL (ref 0–40)

## 2010-06-09 LAB — CBC
HCT: 38.4 % — ABNORMAL LOW (ref 39.0–52.0)
HCT: 44.2 % (ref 39.0–52.0)
Hemoglobin: 13.4 g/dL (ref 13.0–17.0)
MCH: 31.3 pg (ref 26.0–34.0)
MCH: 31.5 pg (ref 26.0–34.0)
MCHC: 33.7 g/dL (ref 30.0–36.0)
MCV: 92.7 fL (ref 78.0–100.0)
MCV: 93.1 fL (ref 78.0–100.0)
Platelets: 148 10*3/uL — ABNORMAL LOW (ref 150–400)
Platelets: 162 10*3/uL (ref 150–400)
RBC: 4.29 MIL/uL (ref 4.22–5.81)
RDW: 14.6 % (ref 11.5–15.5)
RDW: 14.8 % (ref 11.5–15.5)
WBC: 3 10*3/uL — ABNORMAL LOW (ref 4.0–10.5)

## 2010-06-09 LAB — TSH: TSH: 0.264 u[IU]/mL — ABNORMAL LOW (ref 0.350–4.500)

## 2010-06-09 LAB — CARDIAC PANEL(CRET KIN+CKTOT+MB+TROPI)
CK, MB: 11.3 ng/mL (ref 0.3–4.0)
CK, MB: 14.2 ng/mL (ref 0.3–4.0)
Relative Index: 2.8 — ABNORMAL HIGH (ref 0.0–2.5)
Total CK: 398 U/L — ABNORMAL HIGH (ref 7–232)
Total CK: 496 U/L — ABNORMAL HIGH (ref 7–232)
Troponin I: 0.01 ng/mL (ref 0.00–0.06)
Troponin I: 0.07 ng/mL — ABNORMAL HIGH (ref 0.00–0.06)

## 2010-06-09 LAB — COMPREHENSIVE METABOLIC PANEL
ALT: 43 U/L (ref 0–53)
AST: 57 U/L — ABNORMAL HIGH (ref 0–37)
Albumin: 3.4 g/dL — ABNORMAL LOW (ref 3.5–5.2)
Calcium: 9.3 mg/dL (ref 8.4–10.5)
GFR calc Af Amer: >60 mL/min (ref >60)
Sodium: 138 mEq/L (ref 135–145)
Total Protein: 7.1 g/dL (ref 6.0–8.3)

## 2010-06-09 LAB — BASIC METABOLIC PANEL
BUN: 10 mg/dL (ref 6–23)
CO2: 19 mEq/L (ref 19–32)
Chloride: 105 mEq/L (ref 96–112)
Creatinine, Ser: 0.83 mg/dL (ref 0.4–1.5)
Glucose, Bld: 74 mg/dL (ref 70–99)

## 2010-06-09 LAB — URINE DRUGS OF ABUSE SCREEN W ALC, ROUTINE (REF LAB)
Amphetamine Screen, Ur: NEGATIVE
Ethyl Alcohol: <10 mg/dL (ref <10)
Marijuana Metabolite: NEGATIVE
Propoxyphene: NEGATIVE

## 2010-06-09 LAB — COCAINE, URINE, CONFIRMATION: Benzoylecgonine GC/MS Conf: 7241 NG/ML — ABNORMAL HIGH

## 2010-06-09 LAB — HEPARIN LEVEL (UNFRACTIONATED): Heparin Unfractionated: 1.18 IU/mL — ABNORMAL HIGH (ref 0.30–0.70)

## 2010-06-09 LAB — HEMOGLOBIN A1C
Hgb A1c MFr Bld: 5.8 % — ABNORMAL HIGH (ref <5.7)
Mean Plasma Glucose: 120 mg/dL — ABNORMAL HIGH (ref <117)

## 2010-06-09 LAB — GLUCOSE, CAPILLARY
Glucose-Capillary: 143 mg/dL — ABNORMAL HIGH (ref 70–99)
Glucose-Capillary: 155 mg/dL — ABNORMAL HIGH (ref 70–99)
Glucose-Capillary: 159 mg/dL — ABNORMAL HIGH (ref 70–99)
Glucose-Capillary: 232 mg/dL — ABNORMAL HIGH (ref 70–99)

## 2010-06-12 LAB — DIFFERENTIAL
Basophils Absolute: 0 10*3/uL (ref 0.0–0.1)
Eosinophils Relative: 0 % (ref 0–5)
Lymphocytes Relative: 19 % (ref 12–46)
Lymphocytes Relative: 9 % — ABNORMAL LOW (ref 12–46)
Lymphs Abs: 0.4 10*3/uL — ABNORMAL LOW (ref 0.7–4.0)
Lymphs Abs: 0.8 10*3/uL (ref 0.7–4.0)
Monocytes Absolute: 0.2 10*3/uL (ref 0.1–1.0)
Monocytes Relative: 4 % (ref 3–12)
Neutrophils Relative %: 63 % (ref 43–77)

## 2010-06-12 LAB — CBC
HCT: 37.5 % — ABNORMAL LOW (ref 39.0–52.0)
Hemoglobin: 12.9 g/dL — ABNORMAL LOW (ref 13.0–17.0)
MCH: 31.3 pg (ref 26.0–34.0)
MCH: 31.6 pg (ref 26.0–34.0)
MCHC: 33.5 g/dL (ref 30.0–36.0)
MCV: 93.9 fL (ref 78.0–100.0)
MCV: 94.2 fL (ref 78.0–100.0)
MCV: 94.3 fL (ref 78.0–100.0)
Platelets: 165 10*3/uL (ref 150–400)
RBC: 4.13 MIL/uL — ABNORMAL LOW (ref 4.22–5.81)
RDW: 13.4 % (ref 11.5–15.5)
RDW: 13.6 % (ref 11.5–15.5)
WBC: 4.2 10*3/uL (ref 4.0–10.5)

## 2010-06-12 LAB — BASIC METABOLIC PANEL
BUN: 15 mg/dL (ref 6–23)
BUN: 15 mg/dL (ref 6–23)
CO2: 22 mEq/L (ref 19–32)
CO2: 29 mEq/L (ref 19–32)
Calcium: 9 mg/dL (ref 8.4–10.5)
Chloride: 105 mEq/L (ref 96–112)
Chloride: 108 mEq/L (ref 96–112)
Chloride: 111 mEq/L (ref 96–112)
Creatinine, Ser: 0.79 mg/dL (ref 0.4–1.5)
GFR calc Af Amer: >60 mL/min (ref >60)
Glucose, Bld: 150 mg/dL — ABNORMAL HIGH (ref 70–99)
Potassium: 3.6 mEq/L (ref 3.5–5.1)
Sodium: 139 mEq/L (ref 135–145)

## 2010-06-12 LAB — TROPONIN I
Troponin I: 0.01 ng/mL (ref 0.00–0.06)
Troponin I: 0.01 ng/mL (ref 0.00–0.06)
Troponin I: <0.01 ng/mL (ref 0.00–0.06)

## 2010-06-12 LAB — CULTURE, BLOOD (ROUTINE X 2)

## 2010-06-12 LAB — CK TOTAL AND CKMB (NOT AT ARMC)
Relative Index: 1.6 (ref 0.0–2.5)
Total CK: 401 U/L — ABNORMAL HIGH (ref 7–232)

## 2010-06-15 LAB — POCT I-STAT, CHEM 8
Calcium, Ion: 1.22 mmol/L (ref 1.12–1.32)
Glucose, Bld: 98 mg/dL (ref 70–99)
HCT: 42 % (ref 39.0–52.0)
TCO2: 26 mmol/L (ref 0–100)

## 2010-06-15 LAB — BASIC METABOLIC PANEL
BUN: 9 mg/dL (ref 6–23)
CO2: 24 mEq/L (ref 19–32)
Calcium: 9 mg/dL (ref 8.4–10.5)
Calcium: 9.7 mg/dL (ref 8.4–10.5)
Chloride: 108 mEq/L (ref 96–112)
Creatinine, Ser: 0.79 mg/dL (ref 0.4–1.5)
GFR calc Af Amer: >60 mL/min (ref >60)
GFR calc Af Amer: >60 mL/min (ref >60)
Glucose, Bld: 159 mg/dL — ABNORMAL HIGH (ref 70–99)
Potassium: 4.2 mEq/L (ref 3.5–5.1)
Sodium: 140 mEq/L (ref 135–145)

## 2010-06-15 LAB — CBC
HCT: 36 % — ABNORMAL LOW (ref 39.0–52.0)
Hemoglobin: 12.3 g/dL — ABNORMAL LOW (ref 13.0–17.0)
MCHC: 33.3 g/dL (ref 30.0–36.0)
MCHC: 34 g/dL (ref 30.0–36.0)
MCV: 93.3 fL (ref 78.0–100.0)
Platelets: 219 10*3/uL (ref 150–400)
RBC: 3.86 MIL/uL — ABNORMAL LOW (ref 4.22–5.81)
RBC: 4.19 MIL/uL — ABNORMAL LOW (ref 4.22–5.81)
RDW: 13.5 % (ref 11.5–15.5)
WBC: 5.3 10*3/uL (ref 4.0–10.5)

## 2010-06-15 LAB — DIFFERENTIAL
Basophils Absolute: 0 10*3/uL (ref 0.0–0.1)
Basophils Relative: 0 % (ref 0–1)
Eosinophils Absolute: 0 10*3/uL (ref 0.0–0.7)
Eosinophils Relative: 0 % (ref 0–5)
Monocytes Absolute: 0.2 10*3/uL (ref 0.1–1.0)
Monocytes Relative: 4 % (ref 3–12)
Neutro Abs: 4.1 10*3/uL (ref 1.7–7.7)

## 2010-06-15 LAB — OPIATE, QUANTITATIVE, URINE
Hydrocodone: 1647 NG/ML — ABNORMAL HIGH
Hydromorphone GC/MS Conf: NEGATIVE NG/ML
Morphine, Confirm: NEGATIVE NG/ML

## 2010-06-15 LAB — URINE DRUGS OF ABUSE SCREEN W ALC, ROUTINE (REF LAB)
Amphetamine Screen, Ur: NEGATIVE
Barbiturate Quant, Ur: NEGATIVE
Benzodiazepines.: NEGATIVE
Cocaine Metabolites: POSITIVE — AB
Marijuana Metabolite: NEGATIVE
Opiate Screen, Urine: POSITIVE — AB

## 2010-06-15 LAB — COCAINE, URINE, CONFIRMATION: Benzoylecgonine GC/MS Conf: 751 NG/ML — ABNORMAL HIGH

## 2010-06-19 LAB — DIFFERENTIAL
Basophils Absolute: 0 10*3/uL (ref 0.0–0.1)
Basophils Absolute: 0 10*3/uL (ref 0.0–0.1)
Basophils Absolute: 0 10*3/uL (ref 0.0–0.1)
Basophils Relative: 0 % (ref 0–1)
Basophils Relative: 1 % (ref 0–1)
Eosinophils Relative: 0 % (ref 0–5)
Lymphocytes Relative: 11 % — ABNORMAL LOW (ref 12–46)
Lymphocytes Relative: 13 % (ref 12–46)
Lymphocytes Relative: 26 % (ref 12–46)
Lymphs Abs: 0.6 10*3/uL — ABNORMAL LOW (ref 0.7–4.0)
Monocytes Absolute: 0.2 10*3/uL (ref 0.1–1.0)
Monocytes Absolute: 0.6 10*3/uL (ref 0.1–1.0)
Neutro Abs: 2.7 10*3/uL (ref 1.7–7.7)
Neutro Abs: 3.7 10*3/uL (ref 1.7–7.7)
Neutro Abs: 4.2 10*3/uL (ref 1.7–7.7)
Neutrophils Relative %: 85 % — ABNORMAL HIGH (ref 43–77)

## 2010-06-19 LAB — POCT I-STAT, CHEM 8
BUN: 13 mg/dL (ref 6–23)
Chloride: 114 mEq/L — ABNORMAL HIGH (ref 96–112)
Creatinine, Ser: 1 mg/dL (ref 0.4–1.5)
Potassium: 4 mEq/L (ref 3.5–5.1)
Sodium: 142 mEq/L (ref 135–145)
TCO2: 25 mmol/L (ref 0–100)

## 2010-06-19 LAB — COMPREHENSIVE METABOLIC PANEL
AST: 39 U/L — ABNORMAL HIGH (ref 0–37)
Albumin: 3.2 g/dL — ABNORMAL LOW (ref 3.5–5.2)
BUN: 11 mg/dL (ref 6–23)
CO2: 23 mEq/L (ref 19–32)
Calcium: 9.2 mg/dL (ref 8.4–10.5)
Creatinine, Ser: 0.85 mg/dL (ref 0.4–1.5)
GFR calc Af Amer: >60 mL/min (ref >60)
GFR calc non Af Amer: >60 mL/min (ref >60)
Total Bilirubin: 0.5 mg/dL (ref 0.3–1.2)

## 2010-06-19 LAB — CBC
Hemoglobin: 15.3 g/dL (ref 13.0–17.0)
MCHC: 33.7 g/dL (ref 30.0–36.0)
MCHC: 33.8 g/dL (ref 30.0–36.0)
Platelets: 143 10*3/uL — ABNORMAL LOW (ref 150–400)
Platelets: 152 10*3/uL (ref 150–400)
Platelets: 153 10*3/uL (ref 150–400)
RDW: 13.5 % (ref 11.5–15.5)
RDW: 13.8 % (ref 11.5–15.5)
RDW: 14.3 % (ref 11.5–15.5)
WBC: 4.3 10*3/uL (ref 4.0–10.5)

## 2010-06-19 LAB — CK TOTAL AND CKMB (NOT AT ARMC)
CK, MB: 5.6 ng/mL — ABNORMAL HIGH (ref 0.3–4.0)
CK, MB: 7.2 ng/mL (ref 0.3–4.0)
Relative Index: 1.8 (ref 0.0–2.5)
Relative Index: 2.2 (ref 0.0–2.5)
Total CK: 348 U/L — ABNORMAL HIGH (ref 7–232)
Total CK: 397 U/L — ABNORMAL HIGH (ref 7–232)
Total CK: 398 U/L — ABNORMAL HIGH (ref 7–232)

## 2010-06-19 LAB — BASIC METABOLIC PANEL
BUN: 15 mg/dL (ref 6–23)
CO2: 26 mEq/L (ref 19–32)
Calcium: 8.9 mg/dL (ref 8.4–10.5)
Creatinine, Ser: 0.67 mg/dL (ref 0.4–1.5)
GFR calc non Af Amer: >60 mL/min (ref >60)
Glucose, Bld: 137 mg/dL — ABNORMAL HIGH (ref 70–99)
Sodium: 138 mEq/L (ref 135–145)

## 2010-06-19 LAB — HEMOGLOBIN A1C
Hgb A1c MFr Bld: 5.4 % (ref 4.6–6.1)
Mean Plasma Glucose: 108 mg/dL

## 2010-06-19 LAB — LIPID PANEL
Total CHOL/HDL Ratio: 4.8 RATIO
VLDL: 26 mg/dL (ref 0–40)

## 2010-06-19 LAB — POCT CARDIAC MARKERS
CKMB, poc: 6.1 ng/mL (ref 1.0–8.0)
Myoglobin, poc: 478 ng/mL (ref 12–200)
Troponin i, poc: <0.05 ng/mL (ref 0.00–0.09)

## 2010-06-19 LAB — MAGNESIUM: Magnesium: 2 mg/dL (ref 1.5–2.5)

## 2010-06-19 LAB — TROPONIN I: Troponin I: 0.01 ng/mL (ref 0.00–0.06)

## 2010-06-27 LAB — POCT I-STAT 3, VENOUS BLOOD GAS (G3P V)
Acid-base deficit: 3 mmol/L — ABNORMAL HIGH (ref 0.0–2.0)
Bicarbonate: 23.3 mEq/L (ref 20.0–24.0)
TCO2: 25 mmol/L (ref 0–100)
pO2, Ven: 52 mmHg — ABNORMAL HIGH (ref 30.0–45.0)

## 2010-06-27 LAB — BASIC METABOLIC PANEL
BUN: 10 mg/dL (ref 6–23)
BUN: 12 mg/dL (ref 6–23)
CO2: 21 mEq/L (ref 19–32)
Calcium: 8.7 mg/dL (ref 8.4–10.5)
Chloride: 112 mEq/L (ref 96–112)
Creatinine, Ser: 0.6 mg/dL (ref 0.4–1.5)
Creatinine, Ser: 0.69 mg/dL (ref 0.4–1.5)
GFR calc non Af Amer: >60 mL/min (ref >60)

## 2010-06-27 LAB — DIFFERENTIAL
Basophils Absolute: 0 10*3/uL (ref 0.0–0.1)
Basophils Absolute: 0 10*3/uL (ref 0.0–0.1)
Basophils Relative: 0 % (ref 0–1)
Eosinophils Absolute: 0 10*3/uL (ref 0.0–0.7)
Eosinophils Relative: 0 % (ref 0–5)
Eosinophils Relative: 21 % — ABNORMAL HIGH (ref 0–5)
Lymphocytes Relative: 35 % (ref 12–46)
Lymphs Abs: 1.5 10*3/uL (ref 0.7–4.0)
Monocytes Absolute: 0.1 10*3/uL (ref 0.1–1.0)
Monocytes Absolute: 0.5 10*3/uL (ref 0.1–1.0)
Neutro Abs: 1.3 10*3/uL — ABNORMAL LOW (ref 1.7–7.7)

## 2010-06-27 LAB — CBC
HCT: 38.5 % — ABNORMAL LOW (ref 39.0–52.0)
HCT: 45.2 % (ref 39.0–52.0)
Hemoglobin: 12.9 g/dL — ABNORMAL LOW (ref 13.0–17.0)
Hemoglobin: 15.2 g/dL (ref 13.0–17.0)
MCHC: 33.5 g/dL (ref 30.0–36.0)
MCHC: 33.5 g/dL (ref 30.0–36.0)
MCHC: 33.9 g/dL (ref 30.0–36.0)
MCV: 93.1 fL (ref 78.0–100.0)
MCV: 93.5 fL (ref 78.0–100.0)
MCV: 93.7 fL (ref 78.0–100.0)
Platelets: 127 10*3/uL — ABNORMAL LOW (ref 150–400)
Platelets: 138 10*3/uL — ABNORMAL LOW (ref 150–400)
Platelets: 152 10*3/uL (ref 150–400)
RBC: 4.08 MIL/uL — ABNORMAL LOW (ref 4.22–5.81)
RBC: 4.85 MIL/uL (ref 4.22–5.81)
RDW: 13.8 % (ref 11.5–15.5)
RDW: 13.8 % (ref 11.5–15.5)
RDW: 14 % (ref 11.5–15.5)
WBC: 3 10*3/uL — ABNORMAL LOW (ref 4.0–10.5)
WBC: 3.5 10*3/uL — ABNORMAL LOW (ref 4.0–10.5)
WBC: 4.2 10*3/uL (ref 4.0–10.5)
WBC: 4.7 10*3/uL (ref 4.0–10.5)

## 2010-06-27 LAB — POCT I-STAT, CHEM 8
BUN: 14 mg/dL (ref 6–23)
Calcium, Ion: 1.09 mmol/L — ABNORMAL LOW (ref 1.12–1.32)
Chloride: 112 mEq/L (ref 96–112)
Glucose, Bld: 83 mg/dL (ref 70–99)
TCO2: 23 mmol/L (ref 0–100)

## 2010-06-27 LAB — PROTIME-INR
INR: 1.01 (ref 0.00–1.49)
INR: 1.21 (ref 0.00–1.49)
Prothrombin Time: 13.2 seconds (ref 11.6–15.2)
Prothrombin Time: 13.3 seconds (ref 11.6–15.2)
Prothrombin Time: 15.2 seconds (ref 11.6–15.2)

## 2010-06-29 ENCOUNTER — Encounter (HOSPITAL_COMMUNITY): Payer: Self-pay | Admitting: Radiology

## 2010-06-29 ENCOUNTER — Inpatient Hospital Stay (HOSPITAL_COMMUNITY)
Admission: EM | Admit: 2010-06-29 | Discharge: 2010-07-04 | DRG: 190 | Disposition: A | Discharge status: Home / Self Care | Payer: Medicaid Other | Attending: Internal Medicine | Admitting: Internal Medicine

## 2010-06-29 ENCOUNTER — Emergency Department (HOSPITAL_COMMUNITY): Payer: Medicaid Other

## 2010-06-29 DIAGNOSIS — R609 Edema, unspecified: Secondary | ICD-10-CM | POA: Diagnosis present

## 2010-06-29 DIAGNOSIS — J449 Chronic obstructive pulmonary disease, unspecified: Secondary | ICD-10-CM

## 2010-06-29 DIAGNOSIS — Z86718 Personal history of other venous thrombosis and embolism: Secondary | ICD-10-CM

## 2010-06-29 DIAGNOSIS — I10 Essential (primary) hypertension: Secondary | ICD-10-CM | POA: Diagnosis present

## 2010-06-29 DIAGNOSIS — I279 Pulmonary heart disease, unspecified: Secondary | ICD-10-CM | POA: Diagnosis present

## 2010-06-29 DIAGNOSIS — J441 Chronic obstructive pulmonary disease with (acute) exacerbation: Principal | ICD-10-CM | POA: Diagnosis present

## 2010-06-29 DIAGNOSIS — Z91199 Patient's noncompliance with other medical treatment and regimen due to unspecified reason: Secondary | ICD-10-CM

## 2010-06-29 DIAGNOSIS — R0902 Hypoxemia: Secondary | ICD-10-CM | POA: Diagnosis present

## 2010-06-29 DIAGNOSIS — Z9119 Patient's noncompliance with other medical treatment and regimen: Secondary | ICD-10-CM

## 2010-06-29 DIAGNOSIS — I2699 Other pulmonary embolism without acute cor pulmonale: Secondary | ICD-10-CM | POA: Diagnosis present

## 2010-06-29 HISTORY — DX: Acute embolism and thrombosis of unspecified deep veins of unspecified lower extremity: I82.409

## 2010-06-29 LAB — CBC
HCT: 42.5 % (ref 39.0–52.0)
Hemoglobin: 14.4 g/dL (ref 13.0–17.0)
MCH: 31.5 pg (ref 26.0–34.0)
MCHC: 33.9 g/dL (ref 30.0–36.0)
MCV: 93 fL (ref 78.0–100.0)
Platelets: 146 10*3/uL — ABNORMAL LOW (ref 150–400)
RBC: 4.57 MIL/uL (ref 4.22–5.81)
RDW: 14.5 % (ref 11.5–15.5)
WBC: 4.5 10*3/uL (ref 4.0–10.5)

## 2010-06-29 LAB — CARDIAC PANEL(CRET KIN+CKTOT+MB+TROPI)
CK, MB: 11.4 ng/mL (ref 0.3–4.0)
Relative Index: 1.9 (ref 0.0–2.5)
Relative Index: 2.1 (ref 0.0–2.5)
Troponin I: 0.01 ng/mL (ref 0.00–0.06)
Troponin I: 0.01 ng/mL (ref 0.00–0.06)

## 2010-06-29 LAB — DIFFERENTIAL
Basophils Absolute: 0 10*3/uL (ref 0.0–0.1)
Basophils Relative: 0 % (ref 0–1)
Eosinophils Absolute: 0.6 10*3/uL (ref 0.0–0.7)
Eosinophils Relative: 13 % — ABNORMAL HIGH (ref 0–5)
Lymphocytes Relative: 33 % (ref 12–46)
Lymphs Abs: 1.5 10*3/uL (ref 0.7–4.0)
Monocytes Absolute: 0.6 10*3/uL (ref 0.1–1.0)
Monocytes Relative: 13 % — ABNORMAL HIGH (ref 3–12)
Neutro Abs: 1.8 10*3/uL (ref 1.7–7.7)
Neutrophils Relative %: 40 % — ABNORMAL LOW (ref 43–77)

## 2010-06-29 LAB — BASIC METABOLIC PANEL
BUN: 4 mg/dL — ABNORMAL LOW (ref 6–23)
CO2: 22 mEq/L (ref 19–32)
Chloride: 109 mEq/L (ref 96–112)
Creatinine, Ser: 0.77 mg/dL (ref 0.4–1.5)
Potassium: 4 mEq/L (ref 3.5–5.1)

## 2010-06-29 LAB — GLUCOSE, CAPILLARY
Glucose-Capillary: 162 mg/dL — ABNORMAL HIGH (ref 70–99)
Glucose-Capillary: 180 mg/dL — ABNORMAL HIGH (ref 70–99)

## 2010-06-29 LAB — MRSA PCR SCREENING: MRSA by PCR: NEGATIVE

## 2010-06-29 LAB — POCT CARDIAC MARKERS
Myoglobin, poc: 332 ng/mL (ref 12–200)
Troponin i, poc: <0.05 ng/mL (ref 0.00–0.09)

## 2010-06-29 LAB — PROTIME-INR
INR: 1.04 (ref 0.00–1.49)
Prothrombin Time: 13.8 seconds (ref 11.6–15.2)

## 2010-06-29 LAB — HEPARIN LEVEL (UNFRACTIONATED): Heparin Unfractionated: 1.17 IU/mL — ABNORMAL HIGH (ref 0.30–0.70)

## 2010-06-29 MED ORDER — IOHEXOL 300 MG/ML  SOLN
100.0000 mL | Freq: Once | INTRAMUSCULAR | Status: AC | PRN
  Administered 2010-06-29: 100 mL via INTRAVENOUS

## 2010-06-29 NOTE — H&P (Signed)
[name], [name]             ACCOUNT NO.:  [id number]  MEDICAL RECORD NO.:  [id number]           PATIENT TYPE:  E  LOCATION:  WLED                         FACILITY:  [facility]  PHYSICIAN:  Talmage Nap, MD  DATE OF BIRTH:  [birth year]  DATE OF ADMISSION:  06/29/2010 DATE OF DISCHARGE:                             HISTORY & PHYSICAL   PRIMARY CARE PHYSICIAN:  Dr. Clelia Croft at [facility].  History obtainable from the patient.  CHIEF COMPLAINT:  Shortness of breath and cough of about three days' duration.  HISTORY:  The patient is a 50 year old African-American male with history of COPD and recurrent DVT status post IVC filter who was initially admitted to the hospital on April 25, 2010, and discharged on April 28, 2010.  The patient, however, represented today because for the past 2 months he has been persistently short of breath with progressive swelling of the lower extremities.  He also claimed that since discharge he has not been able to afford his medications and working very hard to get his Medicaid to come through.  However, three days prior to presenting to the emergency room, his shortness of breath got progressively worse with cough that is said to be productive of yellowish sputum with nonpleuritic chest pain.  He claimed he was mildly febrile, but denied any chills.  He denied any rigor.  He denied any history of nausea or vomiting.  He also denied any history of PND or orthopnea, but noticed that his swelling in his lower extremity was getting progressively worse.  Symptoms were said to have persisted, hence the patient presented to the hospital to be evaluated.  PAST MEDICAL HISTORY:  Positive for: 1. COPD. 2. History of recurrent DVT. 3. Chronic hypoxemia, not on home O2. 4. Noncompliant with medications because the patient could not afford     medications.  PAST SURGICAL HISTORY:  Recurrent DVT status post IVC filter.  MEDICATIONS:  Presently the  patient does not have any medications because he could not afford them, but discharge medications as of April 28, 2010, includes: 1. Warfarin 10 mg p.o. q.h.s. 2. Prilosec (omeprazole) 20 mg p.o. q.a.m. 3. Prednisone 10 mg four tablets daily with meals. 4. Atrovent inhaler one nebulizer q.6 hours p.r.n. 5. Guaifenesin 600 mg one p.o. b.i.d. 6. Aspirin 81 mg one p.o. daily. 7. Albuterol inhaler two puffs q.8 hours p.r.n. 8. Atrovent nebulizer one nebulizer q.6 p.r.n. 9. Advair Diskus (fluticasone/salmeterol) 250/50 one puff b.i.d.  ALLERGIES:  There are no known allergies.  SOCIAL HISTORY:  Negative for tobacco use.  Occasionally takes alcohol and is currently unemployed.  Trying to get his Medicaid.  FAMILY HISTORY:  Positive for coronary artery disease.  Father had a pacemaker for his cornonary artery disease.  REVIEW OF SYSTEMS:  The patient denies any history of headaches.  No blurred vision.  Complained of shortness of breath with cough that is productive of yellowish sputum with associated nonpleuritic chest pain. He had mild fever.  Denied any chills.  Denied any rigor.  No nausea or vomiting.  He also denied any history of PND or orthopnea.  No abdominal discomfort.  No  diarrhea or hematochezia.  No dysuria or hematuria.Patient has swelling of the lower extremity that is getting progressively worse. No intolerance to heat or cold and no neuropsychiatric disorder.  PHYSICAL EXAMINATION:  GENERAL:  A middle-aged man, very pleasant, in mild respiratory distress, evident by flaring of the ala nasi. VITAL SIGNS:  Blood pressure is 150/96, pulse is 109, respiratory rate is 30, temperature is 98.4. HEENT:  Mild pallor.  Pupils are reactive to light and extraocular muscles are intact. NECK:  No jugular venous distention.  No carotid bruit.  No lymphadenopathy. CHEST:  Showed inspiratory and expiratory rhonchi all over the lung fields.No rales CARDIOVASCULAR:  Heart sounds are  1 and 2, tachycardic. ABDOMEN:  Soft, nontender.  Liver, spleen and kidney are not palpable. Bowel sounds are positive. EXTREMITIES:  Show +1 pedal edema. NEUROLOGIC EXAMINATION:  Nonfocal. MUSCULOSKELETAL SYSTEM:  Unremarkable. SKIN:  Showed normal turgor.  LABORATORY DATA:  Initial complete blood count with differential showed WBC of 4.5, hemoglobin of 14.4, hematocrit of 42.5, MCV of 93.0 with a platelet count of 146, neutrophils 40%, lymphocyte 33% and absolute neutrophil count is 1.8.  Coagulation profile showed PT 30.8, INR 1.04. First set of cardiac markers troponin-I less than 0.05.  BNP less than 30.  Basic metabolic panel showed sodium of 139, potassium of 4.0, chloride of 109 with a bicarbonate of 22, glucose is unknown and BUN is 4, creatinine 0.77.  RADIOLOGIC:  EKG shows some sinus tachycardia with a rate of 117.  No acute ST-wave change noted.  Imaging studies done on the patient include CT angiogram with contrast and it showed tiny subsegmental chronic pulmonary embolism in the right lower lung posteriorly, but no evidence of any acute central emboli.  There is diffuse bronchiectasis with peribronchial wall thickening and increased mucus plugging throughout the lungs suggesting bronchitis.  Chest x-ray did not show any evidence of active cardiopulmonary disease.  ADMITTING IMPRESSION: 1. Chronic obstructive pulmonary disease exacerbation. 2. Pulmonary embolism right lung. 3. History of recurrent deep vein thrombosis status post inferior vena     cava filter. 4. Chronic hypoxemia.  Currently the patient is not on home oxygen. 5. Recurrent pedal edema, questionable secondary to cor pulmonale or     pulmonary hypertension. 6. Noncompliant with medications because the patient could not afford     them.  PLAN:  The plan is to admit the patient to step-down or telemetry.  The patient will be on O2 via nasal cannula 3 liters per minute.  He will be on heparin drip per  protocol to maintain aPTT two to three units normal i.e 70 to 80 seconds. 1. He will be on albuterol and Atrovent nebulizers q.4 hours. 2. Rocephin 1 gram IV q.24 followed by Zithromax 500 mg IV q.24.  The     patient will also be on Solu-Medrol 60 mg IV q.8 hours.  Since the     patient is on steroids, he will be on Accu-Cheks t.i.d. with     regular insulin sliding scale (moderate scale ) and he will also be on     Protonix 40 mg IV q.24 for GI prophylaxis.  Other medications to be     given to the patient will include Lasix 40 mg IV q.12, aspirin 325     mg p.o. daily, morphine 2 mg IV q.4 p.r.n. for chest pain,     Lopressor 50 mg p.o. b.i.d. and Diovan 160 mg p.o. daily.  Further     laboratories to  be ordered for the patient will include cardiac     enzymes q.6 x3, blood culture x2 before starting IV antibiotics, 2-     D echocardiogram, CBCD, CMP and magnesium repeated in the a.m.  The     patient will be followed and evaluated on a day-to-day basis.     Talmage Nap, MD     CN/MEDQ  D:  06/29/2010  T:  06/29/2010  Job:  [id number]  Electronically Signed by Talmage Nap  on 06/29/2010 03:01:36 PM

## 2010-06-30 LAB — DIFFERENTIAL
Lymphocytes Relative: 16 % (ref 12–46)
Lymphs Abs: 0.7 10*3/uL (ref 0.7–4.0)
Monocytes Absolute: 0.3 10*3/uL (ref 0.1–1.0)
Monocytes Relative: 6 % (ref 3–12)
Neutro Abs: 3.3 10*3/uL (ref 1.7–7.7)
Neutrophils Relative %: 78 % — ABNORMAL HIGH (ref 43–77)

## 2010-06-30 LAB — CBC
HCT: 43.8 % (ref 39.0–52.0)
Hemoglobin: 14.1 g/dL (ref 13.0–17.0)
MCH: 30.1 pg (ref 26.0–34.0)
MCHC: 32.2 g/dL (ref 30.0–36.0)
MCV: 93.6 fL (ref 78.0–100.0)
RBC: 4.68 MIL/uL (ref 4.22–5.81)

## 2010-06-30 LAB — COMPREHENSIVE METABOLIC PANEL
AST: 36 U/L (ref 0–37)
CO2: 25 mEq/L (ref 19–32)
Calcium: 9.2 mg/dL (ref 8.4–10.5)
Chloride: 102 mEq/L (ref 96–112)
Creatinine, Ser: 1.07 mg/dL (ref 0.4–1.5)
GFR calc Af Amer: >60 mL/min (ref >60)
GFR calc non Af Amer: >60 mL/min (ref >60)
Glucose, Bld: 202 mg/dL — ABNORMAL HIGH (ref 70–99)
Total Bilirubin: 0.6 mg/dL (ref 0.3–1.2)

## 2010-06-30 LAB — GLUCOSE, CAPILLARY: Glucose-Capillary: 213 mg/dL — ABNORMAL HIGH (ref 70–99)

## 2010-06-30 LAB — PROTIME-INR
INR: 1 (ref 0.00–1.49)
Prothrombin Time: 13.4 seconds (ref 11.6–15.2)

## 2010-06-30 LAB — MAGNESIUM: Magnesium: 2.4 mg/dL (ref 1.5–2.5)

## 2010-07-01 LAB — GLUCOSE, CAPILLARY
Glucose-Capillary: 147 mg/dL — ABNORMAL HIGH (ref 70–99)
Glucose-Capillary: 153 mg/dL — ABNORMAL HIGH (ref 70–99)
Glucose-Capillary: 157 mg/dL — ABNORMAL HIGH (ref 70–99)
Glucose-Capillary: 154 mg/dL — ABNORMAL HIGH (ref 70–99)
Glucose-Capillary: 196 mg/dL — ABNORMAL HIGH (ref 70–99)
Glucose-Capillary: 155 mg/dL — ABNORMAL HIGH (ref 70–99)

## 2010-07-01 LAB — DIFFERENTIAL
Basophils Absolute: 0 10*3/uL (ref 0.0–0.1)
Basophils Relative: 1 % (ref 0–1)
Eosinophils Absolute: 0 10*3/uL (ref 0.0–0.7)
Eosinophils Relative: 0 % (ref 0–5)
Lymphs Abs: 1.3 10*3/uL (ref 0.7–4.0)
Monocytes Relative: 19 % — ABNORMAL HIGH (ref 3–12)
Neutro Abs: 1.4 10*3/uL — ABNORMAL LOW (ref 1.7–7.7)
Neutrophils Relative %: 79 % — ABNORMAL HIGH (ref 43–77)
Neutrophils Relative %: 33 % — ABNORMAL LOW (ref 43–77)

## 2010-07-01 LAB — CULTURE, BLOOD (ROUTINE X 2): Culture: NO GROWTH

## 2010-07-01 LAB — BASIC METABOLIC PANEL
CO2: 24 mEq/L (ref 19–32)
CO2: 27 mEq/L (ref 19–32)
Chloride: 103 mEq/L (ref 96–112)
Chloride: 105 mEq/L (ref 96–112)
Creatinine, Ser: 0.89 mg/dL (ref 0.4–1.5)
Creatinine, Ser: 0.92 mg/dL (ref 0.4–1.5)
GFR calc Af Amer: >60 mL/min (ref >60)
GFR calc Af Amer: >60 mL/min (ref >60)
Potassium: 3.9 mEq/L (ref 3.5–5.1)

## 2010-07-01 LAB — CBC
HCT: 42 % (ref 39.0–52.0)
Hemoglobin: 14.2 g/dL (ref 13.0–17.0)
Hemoglobin: 16 g/dL (ref 13.0–17.0)
MCHC: 33.4 g/dL (ref 30.0–36.0)
MCHC: 33.7 g/dL (ref 30.0–36.0)
MCV: 95.2 fL (ref 78.0–100.0)
MCV: 95.5 fL (ref 78.0–100.0)
Platelets: 185 10*3/uL (ref 150–400)
RBC: 4.48 MIL/uL (ref 4.22–5.81)
RBC: 4.41 MIL/uL (ref 4.22–5.81)
RBC: 5.03 MIL/uL (ref 4.22–5.81)
RDW: 14.4 % (ref 11.5–15.5)
WBC: 3.2 10*3/uL — ABNORMAL LOW (ref 4.0–10.5)
WBC: 3.3 10*3/uL — ABNORMAL LOW (ref 4.0–10.5)

## 2010-07-01 LAB — COMPREHENSIVE METABOLIC PANEL
ALT: 28 U/L (ref 0–53)
AST: 25 U/L (ref 0–37)
Albumin: 3.1 g/dL — ABNORMAL LOW (ref 3.5–5.2)
Alkaline Phosphatase: 76 U/L (ref 39–117)
GFR calc Af Amer: >60 mL/min (ref >60)
Potassium: 3.8 mEq/L (ref 3.5–5.1)
Sodium: 138 mEq/L (ref 135–145)
Total Protein: 6.6 g/dL (ref 6.0–8.3)

## 2010-07-01 LAB — APTT: aPTT: 89 seconds — ABNORMAL HIGH (ref 24–37)

## 2010-07-01 LAB — TSH: TSH: 0.849 u[IU]/mL (ref 0.350–4.500)

## 2010-07-01 LAB — PROTIME-INR: Prothrombin Time: 16.3 seconds — ABNORMAL HIGH (ref 11.6–15.2)

## 2010-07-02 ENCOUNTER — Inpatient Hospital Stay (HOSPITAL_COMMUNITY): Payer: Medicaid Other

## 2010-07-02 LAB — DIFFERENTIAL
Eosinophils Absolute: 0 10*3/uL (ref 0.0–0.7)
Eosinophils Relative: 0 % (ref 0–5)
Lymphs Abs: 0.4 10*3/uL — ABNORMAL LOW (ref 0.7–4.0)
Monocytes Relative: 7 % (ref 3–12)
Neutrophils Relative %: 80 % — ABNORMAL HIGH (ref 43–77)

## 2010-07-02 LAB — GLUCOSE, CAPILLARY
Glucose-Capillary: 148 mg/dL — ABNORMAL HIGH (ref 70–99)
Glucose-Capillary: 210 mg/dL — ABNORMAL HIGH (ref 70–99)
Glucose-Capillary: 179 mg/dL — ABNORMAL HIGH (ref 70–99)

## 2010-07-02 LAB — COMPREHENSIVE METABOLIC PANEL
Albumin: 2.8 g/dL — ABNORMAL LOW (ref 3.5–5.2)
BUN: 18 mg/dL (ref 6–23)
Chloride: 108 mEq/L (ref 96–112)
Creatinine, Ser: 0.74 mg/dL (ref 0.4–1.5)
Total Bilirubin: 0.5 mg/dL (ref 0.3–1.2)
Total Protein: 6 g/dL (ref 6.0–8.3)

## 2010-07-02 LAB — HEPARIN LEVEL (UNFRACTIONATED): Heparin Unfractionated: 0.47 IU/mL (ref 0.30–0.70)

## 2010-07-02 LAB — CBC
MCH: 29.7 pg (ref 26.0–34.0)
MCV: 93.8 fL (ref 78.0–100.0)
Platelets: 158 10*3/uL (ref 150–400)
RDW: 14.2 % (ref 11.5–15.5)

## 2010-07-02 LAB — PROTIME-INR: Prothrombin Time: 20.9 seconds — ABNORMAL HIGH (ref 11.6–15.2)

## 2010-07-02 LAB — MAGNESIUM: Magnesium: 2.7 mg/dL — ABNORMAL HIGH (ref 1.5–2.5)

## 2010-07-03 LAB — MAGNESIUM: Magnesium: 3 mg/dL — ABNORMAL HIGH (ref 1.5–2.5)

## 2010-07-03 LAB — CBC
Platelets: 154 10*3/uL (ref 150–400)
RDW: 14.2 % (ref 11.5–15.5)
WBC: 2.5 10*3/uL — ABNORMAL LOW (ref 4.0–10.5)

## 2010-07-03 LAB — COMPREHENSIVE METABOLIC PANEL
Albumin: 2.8 g/dL — ABNORMAL LOW (ref 3.5–5.2)
BUN: 20 mg/dL (ref 6–23)
Creatinine, Ser: 0.91 mg/dL (ref 0.4–1.5)
Glucose, Bld: 143 mg/dL — ABNORMAL HIGH (ref 70–99)
Total Protein: 5.7 g/dL — ABNORMAL LOW (ref 6.0–8.3)

## 2010-07-03 LAB — PROTIME-INR
INR: 1.93 — ABNORMAL HIGH (ref 0.00–1.49)
Prothrombin Time: 22.2 seconds — ABNORMAL HIGH (ref 11.6–15.2)

## 2010-07-03 LAB — DIFFERENTIAL
Basophils Absolute: 0 10*3/uL (ref 0.0–0.1)
Basophils Relative: 0 % (ref 0–1)
Eosinophils Absolute: 0 10*3/uL (ref 0.0–0.7)
Eosinophils Relative: 0 % (ref 0–5)
Lymphocytes Relative: 11 % — ABNORMAL LOW (ref 12–46)

## 2010-07-03 LAB — HEPARIN LEVEL (UNFRACTIONATED): Heparin Unfractionated: <0.1 IU/mL — ABNORMAL LOW (ref 0.30–0.70)

## 2010-07-03 NOTE — Discharge Summary (Signed)
[name], [name]             ACCOUNT NO.:  [id number]  MEDICAL RECORD NO.:  [id number]           PATIENT TYPE:  I  LOCATION:  1414                         FACILITY[facility]  [facility]  PHYSICIAN:  Talmage Nap, MD  DATE OF BIRTH:  05-17-1960  DATE OF ADMISSION:  06/29/2010 DATE OF DISCHARGE:  07/04/2010                        DISCHARGE SUMMARY - REFERRING   DISCHARGE DIAGNOSES: 1. Chronic obstructive pulmonary disease exacerbation. 2. Acute pulmonary embolism, right lung. 3. History of recurrent deep venous thrombosis status post inferior     vena cava filter. 4. Hypertension. 5. Chronic hypoxemia.  The patient tolerating atmospheric oxygen.     Pulse ox in room air greater than 94%. 6. Recurrent pedal edema, resolved on discharge. 7. Noncompliant with medications - The patient could not afford     medications. 8.?Congestive Heart Failure(Cor-Pulmonale)  PRIMARY CARE PHYSICIAN:  Norberto Sorenson, M.D. At [facility].  The patient is a 50 year old African American male with history of COPD, recurrent DVT status post IVC filter, was admitted to the hospital on June 29, 2010 by Dr. Talmage Nap with 3 days' history of shortness of breath that was said to be getting progressively worse.  The cough was said to be productive of yellow sputum with associated nonpleuritic chest pain.  The patient claimed he was mildly febrile.  Denied any chills or rigor.  He denied any history of nausea or vomiting.  He denied any PND or orthopnea but noticed swelling in his lower extremity that was getting progressively worse.  The patient also denied any history of diarrhea or hematochezia and subsequently presented to the emergency room to be evaluated.  ADMISSION MEDICATIONS:  His admission meds from discharge in February include: 1. Warfarin 10 mg p.o. q.h.s. 2. Prilosec 20 mg p.o. q.a.m. 3. Prednisone 10 mg 4 tablets daily with meals. 4. Albuterol nebs q.6 h. p.r.n. 5. Guaifenesin 600 mg 1  p.o. b.i.d. 6. Aspirin 81 mg 1 p.o. daily. 7. Albuterol inhaler 2 puffs q.8 h. p.r.n. 8. Atrovent nebs q.6 h. p.r.n. 9. Advair Diskus (fluticasone and salmeterol) 250/50 one puff b.i.d.  ALLERGIES:  He has no known allergies.  PAST SURGICAL HISTORY:  Recurrent DVT status post IVC filter.  SOCIAL HISTORY:  Negative for tobacco use, occasionally takes alcohol and is currently unemployed.  Currently, the patient is trying to get his Medicaid approved.  FAMILY HISTORY:  Positive of coronary artery disease.  Father had pacemaker for his coronary artery disease.  REVIEW OF SYSTEMS:  Essentially documented in the initial history and physical.  PHYSICAL EXAMINATION:  GENERAL:  At the time the patient was seen by me, he was very pleasant, in mild respiratory distress evident by flaring of the ala nasi. VITAL SIGNS:  Blood pressure is 150/96, pulse 109, respiratory rate is 30, temperature is 98.4. HEENT:  Mild pallor.  Pupils are reactive to light and extraocular muscles are intact. NECK:  No jugular venous distention.  No carotid bruit.  No lymphadenopathy. Chest:  Showed inspiratory and expiratory rhonchi all over the lung fields.  No rales. CARDIAC:  Heart sounds are 1 and 2, tachycardic. ABDOMEN:  Soft and nontender.  Liver, spleen, kidney  not palpable. Bowel sounds are positive. EXTREMITIES:  Show +1 pedal edema. NEUROLOGIC:  Nonfocal. MUSCULOSKELETAL SYSTEM:  Unremarkable. SKIN:  Showed normal turgor.  LABORATORY DATA:  Initial complete blood count with differential showed WBC of 4.5, hemoglobin of 14.4, hematocrit of 42.5, MCV of 93.0 with a platelet count of 146, neutrophil 40% and absolute neutrophil count is 1.8.  Coagulation profile showed PT 13.8, INR 1.04.  Cardiac markers, troponin I less than 0.05, 0.01 and 0.01 respectively.  BNP less than 30.  Basic metabolic panel showed sodium of 139, potassium of 4.0, chloride of 109 with a bicarb of 22, glucose is 109, BUN is  4, creatinine 0.77.  Routine MRSA screening negative.  Repeat comprehensive metabolic panel done on June 30, 2010 showed sodium of 136, potassium of 4.1, chloride of 103, bicarb of 25, glucose is 202, BUN is 17, creatinine is 1.07.  LFT normal.  Magnesium level is 2.4 and a repeat complete blood count with differential showed WBC of 4.2, hemoglobin of 14.1, hematocrit of 43.8, MCV of 93.6 with a platelet count of 177, neutrophils 78%.  Repeat complete blood count with differential done on April 8 showed WBC of 2.5, hemoglobin of 13.4, hematocrit of 40.7, MCV of 94.2 with a platelet count of 154, neutrophils 83% and absolute neutrophil count is 2.1.  Coagulation profile showed heparin level less than 0.10, PT 22.2, INR 1.93.  Comprehensive metabolic panel showed sodium of 140, potassium of 4.1, chloride of 105 with a bicarb of 30, glucose is 143, BUN is 20, creatinine 0.91 and magnesium level 3.0. Imaging studies done include CT angiogram, showed tiny subsegmental chronic pulmonary embolism in the right lower segment posterior but there is no evidence of any acute central emboli.  There is diffuse bronchiectasis with peribronchial wall thickening and increasing mucus plugging throughout the lungs suggesting bronchitis.  Chest x-ray done on June 29, 2010 showed no evidence of acute cardiopulmonary disease and a repeat chest x-ray done on July 02, 2010 showed decreased interstitial prominence noted throughout both lungs.  No acute findings seen.  A 2-D echo done on April 4 showed left ventricular cavity to be normal.  There is increased pattern of mild LVH, EF of about 60% consistent with grade 1 diastolic dysfunction.  HOSPITAL COURSE:  The patient was admitted to step-down.  He was started on IV heparin drip protocol for his acute pulmonary embolism and dosing was done by pharmacy and at the same time was also started on Coumadin and again dosing was done by pharmacy.  The patient was  placed on O2 via nasal cannula 3 liters per minute and then was also placed on albuterol and Atrovent nebs q.4 hourly, and he was also placed on Solu-Medrol 60 mg IV q. 8 hours.  Since the patient was on steroids, he was started on Accu-Chek t.i.d. with Regular Insulin sliding scale.  He was also empirically started on Rocephin 1 gram IV q.24 h. and Zithromax 500 mg IV q.24 h. and Protonix 40 mg IV q.24 h. for GI prophylaxis; and since the patient was found to have edema in the lower extremities, he was started on Lasix 40 mg IV q. 12 hourly, given aspirin 325 mg p.o. daily and morphine 2 mg IV q.4 h. P.r.n.  He was also placed on Diovan 160 mg p.o. daily and Lopressor 50 mg p.o. b.i.d.  The patient was however stabilized in step-down and subsequently transferred to telemetry.  On tele, the patient was continued on anticoagulation.  Edema in the lower extremities showed remarkable resolution and subsequently Lasix was discontinued.  The patient was also given Robitussin with codeine 15 cc p.o. t.i.d.  During breathing treatment, the patient was given acapella which means chest tapping to enable patient to expectorate.  The patient however continued to experience shortness of breath and was wheezing and Lopressor was discontinued and subsequently the patient showed remarkable improvement.  He was however continued on albuterol and Atrovent nebs, and the dose of Solu-Medrol was increased to 125 mg IV daily.  The patient was followed and evaluated by me on daily basis, made remarkable improvement following the stoppage of Lopressor.  The patient was ambulated without oxygen and pulse ox on room air was more than 94%.  So far, he has remained clinically stable.  He was seen by me today, feels better.  Shortness of breath has improved remarkably and edema in the lower extremity has resolved.  His vital signs today, blood pressure is 154/98, temperature is 97.4, pulse 60, respiratory rate  16, medically stable.  Plan is for the patient to be discharged home on July 04, 2010 on activity as tolerated.  Low-sodium, low-cholesterol diet.Daily weigh and fluid restriction  He is to be followed up by his primary care physician in 1-2 weeks.  DISCHARGE MEDICATIONS:  Medications to be taken at home will include: 1. Albuterol nebulizer q.6 h. p.r.n. 2. Combivent inhaler 2 puffs q.4 h. p.r.n.  He also had nebulizer     machine. 3. Olmesartan 20 mg 1 p.o. daily. 4. Tapered doses of prednisone starting with 60 mg p.o. daily x1     followed by 50 mg p.o. daily x1, 40 mg p.o. daily x1, 30 mg p.o.     daily x1, 20 mg p.o. daily x1, 10 mg p.o. daily x1 and subsequently     discontinued. 5. He will also be on warfarin 8 mg 1 p.o. daily.  This I discussed     with the pharmacy on duty and 8 mg was her recommendation. 6. Advair Diskus 250/50 one puff b.i.d. 7. Aspirin 81 mg 1 p.o. daily. 8. Guaifenesin 600 mg 1 p.o. b.i.d. 9. Ipratropium nebs q.6 h. p.r.n. 10.Prilosec 20 mg 1 p.o. daily. 11.Lasix 40 mg p.o. daily.     Talmage Nap, MD     CN/MEDQ  D:  07/03/2010  T:  07/03/2010  Job:  [id number]  cc:   Norberto Sorenson, MD Fax: [phone]  Electronically Signed by Talmage Nap  on 07/03/2010 03:49:16 PM

## 2010-07-04 LAB — COMPREHENSIVE METABOLIC PANEL
Albumin: 2.7 g/dL — ABNORMAL LOW (ref 3.5–5.2)
BUN: 19 mg/dL (ref 6–23)
Chloride: 104 mEq/L (ref 96–112)
Creatinine, Ser: 0.72 mg/dL (ref 0.4–1.5)
GFR calc non Af Amer: >60 mL/min (ref >60)
Total Bilirubin: 0.5 mg/dL (ref 0.3–1.2)

## 2010-07-04 LAB — CBC
HCT: 40.2 % (ref 39.0–52.0)
Hemoglobin: 13.2 g/dL (ref 13.0–17.0)
MCH: 30 pg (ref 26.0–34.0)
MCHC: 32.8 g/dL (ref 30.0–36.0)
RBC: 4.4 MIL/uL (ref 4.22–5.81)

## 2010-07-04 LAB — DIFFERENTIAL
Lymphocytes Relative: 12 % (ref 12–46)
Monocytes Absolute: 0.1 10*3/uL (ref 0.1–1.0)
Monocytes Relative: 5 % (ref 3–12)
Neutro Abs: 2.4 10*3/uL (ref 1.7–7.7)
Neutrophils Relative %: 84 % — ABNORMAL HIGH (ref 43–77)

## 2010-07-04 LAB — GLUCOSE, CAPILLARY
Glucose-Capillary: 160 mg/dL — ABNORMAL HIGH (ref 70–99)
Glucose-Capillary: 218 mg/dL — ABNORMAL HIGH (ref 70–99)

## 2010-07-04 LAB — HEPARIN LEVEL (UNFRACTIONATED): Heparin Unfractionated: <0.1 IU/mL — ABNORMAL LOW (ref 0.30–0.70)

## 2010-07-04 LAB — PROTIME-INR: INR: 1.93 — ABNORMAL HIGH (ref 0.00–1.49)

## 2010-07-05 LAB — CULTURE, BLOOD (ROUTINE X 2)
Culture  Setup Time: 201204041314
Culture  Setup Time: 201204041314
Culture: NO GROWTH

## 2010-08-09 NOTE — Discharge Summary (Signed)
[name], [name]             ACCOUNT NO.:  [id number]   MEDICAL RECORD NO.:  [id number]          PATIENT TYPE:  INP   LOCATION:  1533                         FACILITY:  [facility]   PHYSICIAN:  Altha Harm, MDDATE OF BIRTH:  1961-02-28   DATE OF ADMISSION:  04/04/2007  DATE OF DISCHARGE:                               DISCHARGE SUMMARY   DISCHARGE DISPOSITION:  A shelter in [place]   FINAL DISCHARGE DIAGNOSES:  1. Recurrent deep vein thrombosis.  2. History of allergic rhinitis.  3. History of asthma.   DISCHARGE MEDICATIONS:  1. Coumadin 7.5 mg p.o. daily to be further titrated by outpatient      physician based upon INR.  2. Lovenox 130 mg subcu daily until Coumadin therapeutic.  3. AeroBid two puffs p.o. b.i.d.  4. Singulair 10 mg p.o. daily.   CONSULTANTS:  Interventional radiology.   PROCEDURES:  IVC filter placement.   DIAGNOSTIC STUDIES:  1. CT pulmonary angiogram which shows no evidence of pulmonary      embolus.  2. Doppler of the right lower extremity which showed DVT.   PRIMARY CARE PHYSICIAN:  [facility].   ALLERGIES:  No known drug allergies.   CHIEF COMPLAINT:  Swelling of the right lower extremity.   HISTORY OF PRESENT ILLNESS:  Please see the H&P dictated by Dr. Ashley Royalty  for details of the HPI.   HOSPITAL COURSE:  The patient was admitted and started on Lovenox.  Inpatient radiology was consulted after a discussion with the patient  and IVC filter was placed.  The patient has had seven DVTs in the past.  Has had a hypercoagulable workup in the past except for homocysteine,  which has not been done.  Hypercoagulable workup has been negative and  the homocysteine done at this time also within normal limits.   The patient was started on Coumadin which is not yet therapeutic and the  patient is being sent home on bridging therapy with Lovenox until  Coumadin is therapeutic.  The patient is being sent home on 7.5 mg to  bridge with  Lovenox 130 mg daily until therapeutic.  The patient is to  follow up with [facility] for monitoring of the INR.   The patient has demonstrated competence in administering Lovenox and is  well familiar with it.   DIETARY RESTRICTIONS:  The patient should be on a vitamin-K-deficient  diet.   PHYSICAL RESTRICTIONS:  None.   FOLLOWUP:  The patient should follow up with his primary care physician  in 3-5 days and certainly follow up tomorrow with the clinic for  checking his INR.      Altha Harm, MD  Electronically Signed     MAM/MEDQ  D:  04/08/2007  T:  04/08/2007  Job:  [id number]

## 2010-08-09 NOTE — H&P (Signed)
NAMEELMIN, WIEDERHOLT             ACCOUNT NO.:  [id number]   MEDICAL RECORD NO.:  [id number]          PATIENT TYPE:  EMS   LOCATION:  ED                           FACILITY[facility]  [facility]   PHYSICIAN:  Altha Harm, MDDATE OF BIRTH:  Aug 28, 1960   DATE OF ADMISSION:  04/04/2007  DATE OF DISCHARGE:                              HISTORY & PHYSICAL   CHIEF COMPLAINT:  Swelling of the right lower extremity and occasional  shortness of breath.   HISTORY OF PRESENT ILLNESS:  This is a 50 year old gentleman with  recurrent DVTs who presents to the emergency room with swelling of the  right lower extremity and occasional shortness of breath with  ambulation.   The patient states that he noticed the swelling approximately 1-2 weeks  ago, and also noted that he was having some shortness of breath at the  same time that the swelling started.  The patient has had multiple DVTs  in the past and has been placed on Coumadin.  The patient has been  noncompliant with his Coumadin since his last DVT and has not been  followed consistently for INR levels in the blood.  The patient feels  that despite having his Coumadin in the past, he still has had recurrent  DVTs.  This has not motivated him to be compliant with his Coumadin.  The patient denies any cough.  He denies any chest pain.  He denies any  fever or chills, any nausea, vomiting or diarrhea.   PAST MEDICAL HISTORY:  1. Recurrent DVTs.  2. Asthma.  3. Allergic rhinitis.   FAMILY HISTORY:  Noncontributory according to patient.   SOCIAL HISTORY:  Currently, the patient is unemployed.  He smokes a half  a pack per day x26 years.  He uses alcohol socially and very  occasionally, and there is no drug use.  The patient has declined to  disclose his living arrangements at this time.   CURRENT MEDICATIONS:  1. Singulair 10 mg p.o. daily.  2. AeroBid inhaler 4 puffs p.o. daily.  3. Coumadin 5 mg p.o. daily (the patient has been noncompliant  with      the use of Coumadin).   ALLERGIES:  No known drug allergies.   PRIMARY CARE Marelly Wehrman:  [facility].   REVIEW OF SYSTEMS:  The patient describes a rash on his left lower  extremity which causes itching.  Otherwise, systems are negative, except  as noted in the HPI.   STUDIES IN THE EMERGENCY ROOM:  Duplex ultrasound of the right lower  extremity reveals an acute DVT.  White blood cell count 4.1, hemoglobin  14.7, hematocrit 44, platelet count 130.  Sodium 143, potassium 4.0,  chloride 109, bicarbonate 29, BUN 6, creatinine 0.75, INR 0.9.   PHYSICAL EXAMINATION:  GENERAL:  The patient is laying in bed, resting  comfortably, in no acute distress.  VITAL SIGNS:  Temperature 98.3, blood pressure 134/87, heart rate 84,  respiratory rate 12, O2 saturations 99% on room air.  HEENT:  The patient is normocephalic and atraumatic.  Pupils equal,  round and reactive to light and accommodation.  Tympanic  membranes are  translucent with good landmarks.  Oropharynx is moist.  The patient has  patient poor dentition.  There is no acute erythema or lesions noted.  NECK:  Trachea is midline.  No masses, no thyromegaly, no JVD, no  carotid bruits.  RESPIRATORY:  The patient has normal respiratory effort.  No accessory  muscle use.  Good excursion bilaterally.  He is clear to auscultation.  No wheezing or rhonchi is noted.  CARDIOVASCULAR:  Normal S1 and S2.  No murmurs, rubs, or gallops.  PMI  is non-displaced.  No heaves or thrills on palpation.  ABDOMEN:  Obese, soft, nontender, nondistended.  No masses, no  hepatosplenomegaly noted.  SKIN:  The patient's skin is warm and dry.  On the left lower extremity,  the patient does have confluent dermatitic rash.  MUSCULOSKELETAL:  The patient has normal range of motion of the joints  of the upper and lower extremities.  No arthritic changes noted.  There  is no warmth, swelling or erythema around the joints.  NEUROLOGIC:  Cranial nerves  II-XII are grossly intact.  The patient has  no focal neurological deficits noted.  DTRs are 2+ bilaterally, upper  and lower extremities.  Sensation is intact to light touch, pinprick and  proprioception.  PSYCHIATRIC:  The patient is alert and oriented x2, has good insight and  cognition.  Good recent and remote recall.   ASSESSMENT AND PLAN:  This patient presents with recurrent deep vein  thrombosis.  The patient has had 7 deep vein thromboses in the past,  according to him.  He does not appear to have had a hypercoagulable work-  up; thus, one will be initiated.  However, given the fact that the  patient continues to have recurrent deep vein thromboses despite  compliance with Coumadin, the patient may be a candidate for an IVC  filter.  This issue has been raised with the patient, and he wants to  give it some thought before making a decision, in the meantime, will  start the patient on Lovenox, initiate a hypercoagulable work-up, and  make a decision regarding initiation of Coumadin based upon whether or  not the patient wants to have a [place] filter placed.  The patient  does have some shortness of breath without any evidence of any other  acute illness, and I will go ahead and rule the patient out for a  pulmonary embolism with a CT pulmonary angiogram.      Altha Harm, MD  Electronically Signed     MAM/MEDQ  D:  04/04/2007  T:  04/04/2007  Job:  [id number]

## 2010-08-12 NOTE — H&P (Signed)
[facility]  Patient:    [name], [name]                    MRN: [id number] Adm. Date:  [id number] Attending:  Garlan Fillers                         History and Physical  CHIEF COMPLAINT:  Left leg pain.  HISTORY OF PRESENT ILLNESS:  The patient is a 50 year old black male who complained of nine days of worsening aching pain in the left leg that is worse with walking.  By the day of admission, his pain was so severe, he was barely able to walk and had to crawl to the bathroom.  His symptoms are somewhat improved with elevating the left leg.  He denies symptoms of shortness of breath or chest discomfort.  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  He last worked on Friday, June 29, 2000.  There is no family history of deep venous thrombosis or pulmonary embolus or sudden, unexplained death.  PAST MEDICAL HISTORY:  Significant for asthma and seasonal allergic rhinitis.  MEDICATIONS:  Medications prior to admission included prednisone p.r.n. asthma flare, with none in the past year.  PAST SURGICAL HISTORY:  None.  ALLERGIES:  No known drug allergies.  FAMILY HISTORY:  Father is 63 years and alive and well.  Mother is age 67 years and has asthma.  He has three brothers and one sister, with no significant medical illnesses.  There is no family history of colon cancer, diabetes mellitus, or coronary artery disease.  SOCIAL HISTORY:  He is single and has no children.  He has ongoing tobacco use of one-half pack per day.  He has a small amount of alcohol use.  REVIEW OF SYSTEMS:  He has frequent symptoms of nasal congestion and complains of moderately severe left calf and leg pain, with some decreased appetite lately.  He has had no recent change in his weight and denies fever, nausea, constipation, diarrhea, hematochezia, dysuria, shortness of breath, or chest pain.  PHYSICAL EXAMINATION:  VITAL SIGNS:  Blood  pressure 113/86, pulse 97, respiratory rate 21, temperature 98.3.  Pulse oxygen saturation on room air 100%.  GENERAL:  He is a well-nourished, well-developed black male in no apparent distress, who had a dry cough.  He was alert and oriented x 4.  HEENT:  Within normal limits.  NECK:  Supple with a full range of motion.  There was no jugular venous distention or carotid bruit.  CHEST:  Clear to auscultation.  CARDIAC:  Regular rate and rhythm, S1, S2, without murmur, gallop, or rub.  ABDOMEN:  Normal bowel sounds, no hepatosplenomegaly or tenderness.  EXTREMITIES:  1+ left lower extremity edema.  There was no evidence of cyanosis or clubbing.  The calf circumferences at 5 cm below the tibial tuberosities were 35.5 cm on the left and 32.5 cm on the right.  RECTAL:  He refused a rectal or a prostate examination despite the description for the purpose of this test.  NEUROLOGIC:  Nonfocal.  SKIN:  His skin exam had multiple areas of hyperpigmentation, less than 1 cm in diameter on the back, of uncertain etiology.  LABORATORY DATA:  A bilateral lower extremity DVT ultrasound exam was normal on the right and on the left side had thrombus in the common femoral vein, superficial femoral vein, profunda femoris vein, and  popliteal veins.  White blood cell count was 4.7, hemoglobin 12.7, hematocrit 38.5, platelet count 219.  Serum sodium 136, potassium 4.2, chloride 102, CO2 29, BUN 8, creatinine 0.7, glucose 108.  Alkaline phosphatase 86, SGOT 26, SGPT 23, total protein 8.5, albumin 3.1, calcium 8.8.  PSA 0.68.  Urinalysis:  Specific gravity greater than 1.040, pH 7.0, blood negative, nitrite negative.  Chest x-ray showed no evidence of acute cardiopulmonary disease.  CT scan of the abdomen and pelvis was normal with the exception of nonocclusive thrombus in the left common femoral vein, superficial femoral vein, and profunda femoris veins. There was no extension of the thrombus to the  pelvic veins and no evidence of pelvic or retroperitoneal mass.  IMPRESSION AND PLAN: 1. Left lower extremity deep vein thrombosis.  This is most likely secondary    to stasis due to the type of work that he performs.  He has no evidence of    venous obstruction, and a hypercoagulable disorder is unlikely given that    he did have an occupation-related predisposing factor and there is no    family history of abnormal thrombosis.  He does not have any clinical    evidence of an underlying malignancy.  His case has been discussed with an    interventional radiologist, who felt that the risks of acute thrombolysis    of his large clot outweighed the possible benefit.     I plan to continue IV heparin with overlapping Coumadin treatment until the    INR is in the therapeutic range of 2-3.  We will check stool Hemoccults to    help exclude the unlikely possibility of an occult colonic malignancy.  2. Asthma.  Although he has a history of asthma, currently he is without    evidence of an acute flare.  I will discuss with him more optimal treatment    of asthma than periodic systemic corticosteroid treatment. 3. Rhinitis.  This has been a chronic issue for him and is likely due to    seasonal allergic rhinitis.  He will be given topical nasal steroids prior    to discharge. DD:  07/05/00 TD:  07/05/00 Job: [id number]

## 2010-08-12 NOTE — Discharge Summary (Signed)
NAMELYNTON, CRESCENZO             ACCOUNT NO.:  [id number]   MEDICAL RECORD NO.:  [id number]          PATIENT TYPE:  INP   LOCATION:  5033                         FACILITY:  [facility]   PHYSICIAN:  Leighton Roach McDiarmid, M.D.DATE OF BIRTH:  1960/06/23   DATE OF ADMISSION:  09/28/2004  DATE OF DISCHARGE:  09/29/2004                                 DISCHARGE SUMMARY   DISCHARGE DIAGNOSES:  1.  Acute asthma exacerbation.  2.  Tobacco abuse.   MEDICATIONS:  1.  Prednisone 40 mg p.o. daily x10 days.  2.  Albuterol metered dose inhaler 2 puffs 4 times a day, inhaled.  3.  Atrovent metered dose inhaler 2 puffs inhaled 4 times a day.  4.  Singulair 10 mg p.o. daily.  5.  Theophylline 125 mg p.o. twice a day.   PROCEDURES:  Chest x-ray on July 5 demonstrated mild COPD, no infiltrates or  pneumothorax.  Diffuse peribronchial thickening.   HOSPITAL COURSE:  Mr. Astorino was sent to the ER from [facility] with a  severe asthma exacerbation of wheezing and dyspnea that had escalated over  the past month.  This is due to the patient not having any of his asthma  medications for one whole month.  The patient's O2 saturation was 96% on 6  liters at the time of admission and work of breathing was significantly  increased.  He received IV Solu Medrol and continuous albuterol medications  and began improving much after that.  His oxygen was gradually weaned to  room air.  The following day, the patient had only slight wheezes and his  shortness of breath was very significantly improved and his oxygen  saturations were between 98 and 100%.  The patient was on p.o. steroids,  albuterol and Atrovent nebs, as well as Singulair.  Later that afternoon, on  day of discharge, a walking O2 test was done during which the patient  ambulated for 200-300 yards at a moderate walking pace and had no dyspnea on  exertion and had an oxygen saturation of 98-100% on room air the entire  period.  He was discharged home in  good condition and he was given albuterol  and Atrovent metered dose inhalers as well as a peak flow device to take  home with home with him and was instructed to go [facility] that day to fill  his prednisone and other prescriptions.  He was also instructed to measure  his peak flows on a daily basis and record these numbers and bring them to  [facility] in one week.  He will need to have his Theophylline level checked  at this time.  The patient was also strongly advised to stop smoking.  The  patient is still in the contemplative stage.   FOLLOW UP ITEMS/DISCHARGE INSTRUCTIONS:  1.  The patient is to take all medications as prescribed.  2.  The patient is to have his prescriptions filled at [facility] today.  3.  He is to make a followup appointment in one week at [facility].       KS/MEDQ  D:  09/29/2004  T:  09/29/2004  Job:  [id number]   cc:   Tomasa Rand

## 2010-08-12 NOTE — Discharge Summary (Signed)
[place]  Patient:    DONTRAE, MORINI Visit Number: [id number] MRN: [id number]          Service Type: MED Location: 3W [phone] Attending Physician:  Leilani Able D. Dictated by:   Leilani Able, M.D. Admit Date:  10/04/2000 Discharge Date: 10/10/2000                             Discharge Summary  DATE OF BIRTH:  1961-02-06  REASON FOR ADMISSION:  Left leg pain.  HISTORY OF PRESENT ILLNESS:  The patient is a 50 year old African-American male who was previously healthy, but had a recent history of left leg deep venous thrombosis as his admission on July 03, 2000, for left leg deep venous thrombosis.  He had finished a course of approximately six weeks of oral anticoagulants.  He presented again with a recent history of left leg pain and thigh pain.  He states that his pain was exacerbated by walking or bending his knee.  At the time, his pain was moderately severe.  LABORATORY DATA:  PT 13.9, INR 1.1, PTT 25.  His white count was 4.9.  Spiral CT of his chest was negative.  Venous Doppler did reveal a deep venous thrombosis in the popliteal vein with superficial venous profunda and CF veins.  HOSPITAL COURSE:  The patient was admitted for left leg deep venous thrombosis and anticoagulated with heparin IV.  That same evening he was started on 10 mg of Warfarin p.o.  The patient remained on heparin until his Coumadin kicked in with a responsive therapeutic INR.  The patient remained to have some left leg discomfort, however, that improved over the course of his hospitalization. His vital signs remained stable, as well as his clinical status.  He was discharged home on October 10, 2000.  DISCHARGE MEDICATIONS:  Coumadin 5 mg.  FOLLOWUP:  Dr. Pecola Leisure in two days at the [facility]. Dictated by:   Leilani Able, M.D. Attending Physician:  Leilani Able D. DD:  12/06/00 TD:  12/06/00 Job: [id number]

## 2010-08-12 NOTE — H&P (Signed)
[name], [name]             ACCOUNT NO.:  [id number]   MEDICAL RECORD NO.:  [id number]          PATIENT TYPE:  INP   LOCATION:  5033                         FACILITY:  [facility]   PHYSICIAN:  Leighton Roach McDiarmid, M.D.DATE OF BIRTH:  18-Nov-1960   DATE OF ADMISSION:  09/28/2004  DATE OF DISCHARGE:                                HISTORY & PHYSICAL   CHIEF COMPLAINT:  Asthma exacerbation.   HISTORY OF PRESENT ILLNESS:  The patient is a 50 year old male with  extensive asthma and tobacco history who ran out of his asthma meds one  month ago.  He reports he has been feeling ill for this entire month.  He  had a URI with cough about 2 weeks ago and also reports some fever back  then.  The symptoms became unbearable about 2 days ago with continued  wheezing, dyspnea, and cough.  He went to [facility] this morning and was  given albuterol neb x 1, Solu-Medrol IM, and then sent to the ER via  ambulance.  In the ED, he has been given continuous two-hour albuterol nebs  and is feeling better but still with severe cough and accessory muscle use,  and wheezing.  The patient reports asthma is under good control when he  takes and has his medicines regularly.  He has had no productive sputum and  no current fevers or sweats.   PAST MEDICAL HISTORY:  1.  Asthma.  He has never been intubated but has had multiple      hospitalizations.  2.  Seasonal allergies.  3.  The patient reports a history of blood clot in his legs x2 and states he      was on blood thinners for 3 weeks and then stopped taking them.   SURGICAL HISTORY:  None.   FAMILY HISTORY:  Noncontributory.   SOCIAL HISTORY:  The patient lives alone.  He works at a [employer].  He  obtains his medicines through [facility].  He has a positive tobacco  history of about 30 years at 5-10 cigarettes per day.  He drinks occasional  alcohol with his last beer yesterday.  He has never had any withdrawal,  shakes, tremors, or delirium  tremens.  He denies any current drug use but  has used marijuana in the past.   ALLERGIES:  None.   MEDICATIONS:  Mometasone inhaled, Singulair,  Allegra, albuterol as needed.   REVIEW OF SYSTEMS:  As above but are otherwise negative.   PHYSICAL EXAMINATION:  VITAL SIGNS:  Temperature 97.4, blood pressure  106/63, heart rate 79, respirations 23, oxygen saturation 96% on 6 liters  nasal cannula.  GENERAL:  He is awake, alert, and oriented x3, in mild distress, unable to  speak in full sentences due to coughing.  HEENT:  Pupils equally round and reactive to light.  Anicteric sclerae.  Non  injected conjunctivae.  Oropharynx is moist with no erythema.  NECK:  Supple. No thyromegaly.  No lymphadenopathy.  CHEST:  Bilateral coarse diffuse wheezes and accessory muscle use.  CARDIOVASCULAR:  Regular rate, tachy, normal S1 and S2.  No murmurs.  ABDOMEN:  Soft, nontender, nondistended.  Positive bowel sounds.  No  hepatosplenomegaly.  EXTREMITIES:  No edema.  Dorsalis pedis pulses +2 bilaterally.  NEURO:  Grossly intact.  Moves all four extremities equally.   IMAGING STUDIES:  Chest x-ray revealed diffuse peribronchial thickening,  stable mild COPD, and bronchitis.   LABS:  White blood cell count 3.8, hemoglobin 13.9, hematocrit 42, platelets  199, neutrophils 59%.  Sodium 138, potassium 3.6, chloride 111, bicarb 21,  BUN 6, creatinine 0.6, glucose 92, calcium 8.6.  AST 34, ALT 29, alk phos  75, total bilirubin 0.9, total protein 6.7, albumin 3.1.  Urinalysis:  Specific gravity 1.02, hemoglobin negative, nitrite negative, LE negative.   ASSESSMENT:  A 50 year old male with acute asthma exacerbation.   PLAN:  1.  Asthma.  He was given steroids today.  We will continue this as      prednisone p.o. starting tomorrow.  We will give albuterol q.4h. nebs      and then q.2h. as needed.  We will also start Atrovent as there is      likely a COPD component, given his long tobacco history.  Goal  is to      restart on home meds, when he has them.  He received them today at      [facility]  We do not plan to increase his therapy at this time, as      the exacerbation appears to be secondary to not having his medication.  2.  Leukopenia.  We will recheck a CBC in the morning.  This should increase      since he has had steroids, otherwise we may need to do a workup,      including HIV test.  3.  History of blood clots.  No current edema.  Will not check Doppler      studies, as he says they have been checked in the past and were      negative, but we will place on Lovenox for prophylaxis.   DISPOSITION:  Will likely have 2-3-day stay in hospital to control his  symptoms and restart his medicines       TV/MEDQ  D:  09/29/2004  T:  09/29/2004  Job:  [id number]

## 2010-08-12 NOTE — Discharge Summary (Signed)
[name], [name]             ACCOUNT NO.:  [id number]   MEDICAL RECORD NO.:  [id number]          PATIENT TYPE:  INP   LOCATION:  2017                         FACILITY:  [facility]   PHYSICIAN:  Elliot Cousin, M.D.    DATE OF BIRTH:  Mar 23, 1961   DATE OF ADMISSION:  12/29/2003  DATE OF DISCHARGE:  12/31/2003                                 DISCHARGE SUMMARY   DISCHARGE DIAGNOSES:  1.  Acute asthmatic bronchitis exacerbation.  2.  History of asthma diagnosed 10 years ago, on chronic prednisone therapy.  3.  Tobacco abuse.  4.  History of left lower extremity deep venous thrombosis diagnosed in      April of 2002, and July of 2002 (Coumadin started, but the patient was      admittedly noncompliant).  Left lower extremity venous Doppler study      during this admission was negative for deep venous thrombosis.  5.  History of alcohol abuse.  6.  Chronic rash consistent with possibly seborrheic dermatitis.   DISCHARGE MEDICATIONS:  1.  Azithromycin 25 mg p.o. daily for 5 more days.  2.  Prednisone dose taper, 10 mg 12-day course, take as directed.  When      complete, restart prednisone 10 mg daily.  3.  Combivent MDI 2 puffs every 4 hours as needed.  4.  Tussionex cough syrup one-half teaspoon every 4 hours as needed.  5.  Claritin 10 mg daily.  6.  Hydrocortisone cream or ointment, apply to rash twice daily.   DISCHARGE DISPOSITION:  The patient was discharged to home in improved and  stable condition on December 31, 2003.  The patient was advised to reapply for  [facility] in one week.   HISTORY OF PRESENT ILLNESS:  The patient is a 50 year old man with a past  medical history significant for asthma and left lower extremity DVT, who  presented to the emergency department with a one week history of progressive  shortness of breath and wheezing.  The patient does smoke; however, he had  been unable to smoke for approximately a week or two prior to hospital  admission.   He denied pleurisy.  He did have an associated cough with yellow  sputum.  He denied fever, chills, or night sweats.  The patient had been  using his Albuterol inhaler more frequently; however, he did not receive any  relief.  He had been on chronic prednisone therapy in the past; however, he  has been out of the prednisone for the past two weeks.  The patient has no  history of intubation or mechanical ventilation secondary to asthma  exacerbation.   HOSPITAL COURSE:  1.  ACUTE ASTHMATIC BRONCHITIS EXACERBATION:  The initial management and      treatment started in the emergency department when the patient was      placed on a continuous Albuterol nebulizer for approximately one hour.      He was given 125 mg of Solu-Medrol IV x1.  Treatment continued with Solu-      Medrol 80 mg IV every 6 hours, and Albuterol and Atrovent  nebulizers      q.4h.  The patient was treated symptomatically with Sondra Come and      Tussionex cough syrup.  Pepcid at 20 mg b.i.d. as well as Claritin 10 mg      daily were added to the treatment regimen because the patient appeared      to be atopic.  The chest x-ray on admission revealed bronchitic changes.      The patient was started on empiric treatment with Azithromycin and      Rocephin.  At the time of hospital admission, the patient was      oxygenating between 90 and 94%.  He did not appear to be in acute      respiratory distress at the time of my assessment in the emergency      department; however, he had been treated with nebulizers for      approximately one hour before my initial assessment.  Following      treatment over the hospital course, the wheezing subsided substantially.      He was much improved at the time of hospital discharge.  The patient's      white blood cell count remained within normal limits during the hospital      course.  He was completely afebrile during the hospital course as well.      The patient will be discharged to  home on a prednisone dose taper to be      completed as directed.  He was then advised to continue prednisone at 10      mg daily as previously directed by his primary care physician.  He was      also discharged to home on 5 more days of Azithromycin.  The patient did      receive help from the [facility] from [facility] for      medications until he is seen by his physician at [facility].  2.  HISTORY OF LEFT LOWER EXTREMITY DVT:  During the patient's history and      physical, he complained of intermittent left leg swelling.  He does have      a history of a DVT in 2002.  For further evaluation, a venous Doppler      study was ordered to rule out DVT during this admission.  The Doppler      study was negative.   DISCHARGE LABORATORIES:  Sodium 139, potassium 4.1, chloride 110, CO2 24,  glucose 150, BUN 16, creatinine 0.8, calcium 9.2.  WBC 4.5, hemoglobin 13.6,  hematocrit 39.8, platelets 201.       DF/MEDQ  D:  01/01/2004  T:  01/01/2004  Job:  [id number]   cc:   Cleophas Dunker [facility]
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
## 2010-08-12 NOTE — Discharge Summary (Signed)
[facility]  Patient:    [name], [name]                    MRN: [id number] Adm. Date:  [id number] Disc. Date: [id number] Attending:  Garlan Fillers                           Discharge Summary  PERTINENT FINDINGS:  Patient is a 50 year old black male with an uncomplicated past medical history who complained of nine days of worsening aching pain in the left leg.  The pain is worse with walking, and by the day of admission, his pain was so severe, he was barely able to walk and had to crawl to the bathroom.  His symptoms were somewhat improved with elevating the left leg. He denied symptoms of shortness of breath or chest discomfort.  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  He last worked on Friday, June 29, 2000.  There is no family history of deep venous thrombosis, pulmonary embolism, or sudden unexplained death.  PAST MEDICAL HISTORY:  Significant for asthma and seasonal allergic rhinitis.  MEDICATIONS PRIOR TO ADMISSION:  Prednisone as needed for asthma flares with none in the past year.  INITIAL PHYSICAL EXAMINATION:  VITAL SIGNS:  Blood pressure 113/86, pulse 97, respiratory rate 21, temperature 98.3, pulse oxygenation saturation on room air 100%.  GENERAL:  He is a well-nourished, well-developed black male in no apparent distress.  He had a dry cough.  He was alert and oriented x 4.  HEENT:  Exam was within normal limits.  NECK:  Supple with a full range of motion.  There was no jugular venous distention.  CHEST:  Clear to auscultation.  HEART:  Regular rate and rhythm, S1, S2, without murmur, gallop, or rub.  ABDOMEN:  Normal bowel sounds with no hepatosplenomegaly or tenderness.  RECTAL:  He refused a rectal exam.  EXTREMITIES:  He had 1+ left lower extremity edema with no evidence of cyanosis or clubbing.  The calf circumferences at 5 cm below the tibial tuberosities were 35.5 cm on the left and  32.5 cm on the right.  LABORATORY STUDIES:  White blood cell count 4.7, hemoglobin 12.7, hematocrit 38.5, platelet count 219.  Serum sodium 136, potassium 4.2, chloride 102, CO2 29, BUN 8, creatinine 0.7, glucose 108, alkaline phosphatase 86, SGOT 26, SGPT 23, total protein 8.5, albumin 3.1, calcium 8.8, PSA 0.68. Urinalysis:  Specific gravity greater than 1.040, pH 7.0, with no evidence of microhematuria.  A chest x-ray showed no evidence of acute cardiopulmonary disease.  A CT scan of the abdomen and pelvis showed nonocclusive thrombus in the left common femoral vein which did not extend into the pelvis.  There was no evidence of pelvic or a retroperitoneal mass.  A bilateral lower extremity DVT ultrasound exam was normal on the right, and on the left side showed extensive thrombus in the common femoral vein, superficial femoral vein, profunda femoris vein, and popliteal vein.  PROCEDURES:  Bilateral deep venous thrombosis ultrasound exam and computer tomography scan of the abdomen and pelvis.  COMPLICATIONS:  None.  CONDITION ON DISCHARGE:  The patients left leg pain is much improved and he is able to walk without significant difficulty.  He has had no dyspnea and no fever during his stay.  HOSPITAL COURSE:  Patient was admitted to a medical bed without telemetry.  He was started  on intravenous heparin followed by Coumadin with oversight by the hospital pharmacist.  He did well with gradual optimization of the INR.  The swelling in his left leg gradually decreased and he was able to walk with much less pain in the left calf.  DISCHARGE DIAGNOSES: 1. Left lower extremity deep venous thrombosis, presumed secondary to stasis. 2. History of asthma. 3. History of seasonal allergic rhinitis.  DISCHARGE MEDICATIONS: 1. Coumadin 10 mg p.o. once daily. 2. Percocet 5/325 one to two tablets p.o. q.6h. p.r.n. pain, #30. 3. OxyContin 10 mg p.o. q.12h., #20. 4. Miralax 17 g in beverage  of choice once daily. 5. Celebrex 200 mg p.o. once daily.  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  He was given verbal and written information requiring discussing vitamin K containing foods and advised to not make abrupt changes in his diet.  If he has any unusual bleeding, he was advised to call Dr. Eloise Harman at [phone].  He was advised to stop by our office on April 16 to have a INR test performed.  He will be seen in our office for a follow-up in approximately two to three weeks following discharge. DD:  07/18/00 TD:  07/18/00 Job: [id number]

## 2010-12-14 LAB — CBC
HCT: 42
HCT: 42.8
Hemoglobin: 14.3
Hemoglobin: 14.5
MCHC: 33.8
MCHC: 34.1
MCV: 90.9
MCV: 91.9
Platelets: 130 — ABNORMAL LOW
Platelets: 139 — ABNORMAL LOW
Platelets: 145 — ABNORMAL LOW
RBC: 4.63
RBC: 4.66
RDW: 13.5
RDW: 13.7
RDW: 14.1
WBC: 4
WBC: 4.2

## 2010-12-14 LAB — BETA-2-GLYCOPROTEIN I ABS, IGG/M/A: Beta-2-Glycoprotein I IgM: <4 U/mL (ref <10)

## 2010-12-14 LAB — PROTIME-INR
INR: 0.9
INR: 1
INR: 1.1
INR: 1.3
Prothrombin Time: 12.8
Prothrombin Time: 12.8
Prothrombin Time: 13.3
Prothrombin Time: 14.2
Prothrombin Time: 16.7 — ABNORMAL HIGH

## 2010-12-14 LAB — PROTEIN C, TOTAL: Protein C, Total: 73 % (ref 70–140)

## 2010-12-14 LAB — BASIC METABOLIC PANEL
BUN: 6
BUN: 6
CO2: 25
Calcium: 8.4
Calcium: 9
Chloride: 100
Creatinine, Ser: 0.74
GFR calc Af Amer: >60
GFR calc non Af Amer: >60
GFR calc non Af Amer: >60
Glucose, Bld: 63 — ABNORMAL LOW
Glucose, Bld: 93
Potassium: 4.2
Sodium: 139
Sodium: 143

## 2010-12-14 LAB — CARDIOLIPIN ANTIBODIES, IGG, IGM, IGA
Anticardiolipin IgG: <7 — ABNORMAL LOW (ref <11)
Anticardiolipin IgM: <7 — ABNORMAL LOW (ref <10)

## 2010-12-14 LAB — PSA: PSA: 0.42

## 2010-12-14 LAB — PROTHROMBIN GENE MUTATION

## 2010-12-14 LAB — DIFFERENTIAL
Basophils Absolute: 0
Lymphocytes Relative: 25
Neutro Abs: 2.2

## 2010-12-14 LAB — HOMOCYSTEINE: Homocysteine: 13.6

## 2010-12-14 LAB — LUPUS ANTICOAGULANT PANEL
DRVVT: 35.8 — ABNORMAL LOW (ref 36.1–47.0)
PTT Lupus Anticoagulant: 35.5 — ABNORMAL LOW (ref 36.3–48.8)

## 2010-12-16 LAB — PROTIME-INR
INR: 1.6 — ABNORMAL HIGH
Prothrombin Time: 19.6 — ABNORMAL HIGH

## 2010-12-22 LAB — PROTIME-INR: INR: 2.9 — ABNORMAL HIGH

## 2011-01-06 LAB — CBC
HCT: 41.2
Hemoglobin: 13.8
MCV: 93.3
RDW: 13.7

## 2011-01-06 LAB — I-STAT 8, (EC8 V) (CONVERTED LAB)
BUN: 13
Glucose, Bld: 101 — ABNORMAL HIGH
Hemoglobin: 15.3
Potassium: 4.3
Sodium: 141

## 2011-01-06 LAB — DIFFERENTIAL
Basophils Absolute: 0
Eosinophils Relative: 3
Lymphocytes Relative: 29
Monocytes Absolute: 0.5

## 2011-01-06 LAB — POCT I-STAT CREATININE: Creatinine, Ser: 1

## 2011-01-09 LAB — BASIC METABOLIC PANEL WITH GFR
CO2: 28
Chloride: 107
Creatinine, Ser: 0.85
GFR calc Af Amer: >60
Potassium: 4
Sodium: 142

## 2011-01-09 LAB — DIFFERENTIAL
Basophils Absolute: 0
Basophils Absolute: 0
Basophils Relative: 1
Basophils Relative: 0
Eosinophils Absolute: 0
Eosinophils Absolute: 0.2
Eosinophils Relative: 0
Eosinophils Relative: 6 — ABNORMAL HIGH
Lymphocytes Relative: 12
Lymphs Abs: 0.8
Monocytes Absolute: 0.4
Monocytes Absolute: 0.4
Monocytes Relative: 5
Neutro Abs: 5.4
Neutro Abs: 2.4
Neutrophils Relative %: 82 — ABNORMAL HIGH

## 2011-01-09 LAB — I-STAT 8, (EC8 V) (CONVERTED LAB)
BUN: 6
Bicarbonate: 22.5
Chloride: 108
Glucose, Bld: 120 — ABNORMAL HIGH
pCO2, Ven: 34.6 — ABNORMAL LOW
pH, Ven: 7.422 — ABNORMAL HIGH

## 2011-01-09 LAB — BASIC METABOLIC PANEL
BUN: 11
Calcium: 8.8
GFR calc non Af Amer: >60
Glucose, Bld: 179 — ABNORMAL HIGH

## 2011-01-09 LAB — CBC
HCT: 40.2
HCT: 39
Hemoglobin: 13.7
Hemoglobin: 13
MCHC: 34.1
MCHC: 33.4
MCV: 91.7
MCV: 92.4
Platelets: 163
RBC: 4.38
RDW: 14.4 — ABNORMAL HIGH
RDW: 14.7 — ABNORMAL HIGH
WBC: 6.6

## 2011-01-09 LAB — PROTIME-INR
INR: 1
Prothrombin Time: 16.4 — ABNORMAL HIGH
Prothrombin Time: 13

## 2011-01-09 LAB — APTT: aPTT: 23 — ABNORMAL LOW

## 2012-02-10 ENCOUNTER — Emergency Department (HOSPITAL_COMMUNITY): Payer: Medicaid Other

## 2012-02-10 ENCOUNTER — Inpatient Hospital Stay (HOSPITAL_COMMUNITY)
Admission: EM | Admit: 2012-02-10 | Discharge: 2012-02-14 | DRG: 192 | Disposition: A | Discharge status: Home / Self Care | Payer: Medicaid Other | Attending: Internal Medicine | Admitting: Internal Medicine

## 2012-02-10 ENCOUNTER — Encounter (HOSPITAL_COMMUNITY): Payer: Self-pay | Admitting: Emergency Medicine

## 2012-02-10 ENCOUNTER — Observation Stay (HOSPITAL_COMMUNITY): Payer: Medicaid Other

## 2012-02-10 DIAGNOSIS — J45901 Unspecified asthma with (acute) exacerbation: Secondary | ICD-10-CM | POA: Diagnosis present

## 2012-02-10 DIAGNOSIS — R0602 Shortness of breath: Secondary | ICD-10-CM

## 2012-02-10 DIAGNOSIS — F17200 Nicotine dependence, unspecified, uncomplicated: Secondary | ICD-10-CM | POA: Diagnosis present

## 2012-02-10 DIAGNOSIS — R109 Unspecified abdominal pain: Secondary | ICD-10-CM | POA: Diagnosis present

## 2012-02-10 DIAGNOSIS — R21 Rash and other nonspecific skin eruption: Secondary | ICD-10-CM | POA: Diagnosis present

## 2012-02-10 DIAGNOSIS — I10 Essential (primary) hypertension: Secondary | ICD-10-CM

## 2012-02-10 DIAGNOSIS — K219 Gastro-esophageal reflux disease without esophagitis: Secondary | ICD-10-CM | POA: Diagnosis present

## 2012-02-10 DIAGNOSIS — J310 Chronic rhinitis: Secondary | ICD-10-CM

## 2012-02-10 DIAGNOSIS — J441 Chronic obstructive pulmonary disease with (acute) exacerbation: Principal | ICD-10-CM | POA: Diagnosis present

## 2012-02-10 DIAGNOSIS — Z86711 Personal history of pulmonary embolism: Secondary | ICD-10-CM

## 2012-02-10 HISTORY — DX: Other pulmonary embolism without acute cor pulmonale: I26.99

## 2012-02-10 LAB — COMPREHENSIVE METABOLIC PANEL
ALT: 28 U/L (ref 0–53)
AST: 47 U/L — ABNORMAL HIGH (ref 0–37)
Alkaline Phosphatase: 83 U/L (ref 39–117)
CO2: 21 mEq/L (ref 19–32)
Calcium: 9.2 mg/dL (ref 8.4–10.5)
Chloride: 102 mEq/L (ref 96–112)
GFR calc Af Amer: >90 mL/min (ref >90)
GFR calc non Af Amer: >90 mL/min (ref >90)
Glucose, Bld: 190 mg/dL — ABNORMAL HIGH (ref 70–99)
Sodium: 135 mEq/L (ref 135–145)
Total Bilirubin: 0.3 mg/dL (ref 0.3–1.2)

## 2012-02-10 LAB — CBC WITH DIFFERENTIAL/PLATELET
Basophils Absolute: 0 10*3/uL (ref 0.0–0.1)
Eosinophils Absolute: 0 10*3/uL (ref 0.0–0.7)
Eosinophils Relative: 0 % (ref 0–5)
HCT: 40.5 % (ref 39.0–52.0)
MCH: 31.6 pg (ref 26.0–34.0)
MCHC: 35.1 g/dL (ref 30.0–36.0)
MCV: 90 fL (ref 78.0–100.0)
Monocytes Absolute: 0 10*3/uL — ABNORMAL LOW (ref 0.1–1.0)
Platelets: 154 10*3/uL (ref 150–400)
RDW: 12.6 % (ref 11.5–15.5)

## 2012-02-10 LAB — APTT: aPTT: 26 seconds (ref 24–37)

## 2012-02-10 LAB — POCT I-STAT TROPONIN I: Troponin i, poc: 0.01 ng/mL (ref 0.00–0.08)

## 2012-02-10 MED ORDER — PREDNISONE 20 MG PO TABS
60.0000 mg | ORAL_TABLET | Freq: Once | ORAL | Status: AC
  Administered 2012-02-10: 60 mg via ORAL
  Filled 2012-02-10: qty 3

## 2012-02-10 MED ORDER — ALBUTEROL SULFATE (5 MG/ML) 0.5% IN NEBU
2.5000 mg | INHALATION_SOLUTION | RESPIRATORY_TRACT | Status: DC | PRN
  Filled 2012-02-10 (×2): qty 0.5

## 2012-02-10 MED ORDER — IPRATROPIUM BROMIDE 0.02 % IN SOLN
0.5000 mg | RESPIRATORY_TRACT | Status: DC | PRN
  Filled 2012-02-10: qty 2.5

## 2012-02-10 MED ORDER — ACETAMINOPHEN 325 MG PO TABS: 650.0000 mg | ORAL_TABLET | Freq: Four times a day (QID) | ORAL | Status: DC | PRN

## 2012-02-10 MED ORDER — ALBUTEROL SULFATE (5 MG/ML) 0.5% IN NEBU
2.5000 mg | INHALATION_SOLUTION | RESPIRATORY_TRACT | Status: DC
  Administered 2012-02-10 – 2012-02-14 (×23): 2.5 mg via RESPIRATORY_TRACT
  Filled 2012-02-10 (×22): qty 0.5

## 2012-02-10 MED ORDER — ALBUTEROL (5 MG/ML) CONTINUOUS INHALATION SOLN
10.0000 mg/h | INHALATION_SOLUTION | RESPIRATORY_TRACT | Status: AC
  Administered 2012-02-10: 10 mg/h via RESPIRATORY_TRACT

## 2012-02-10 MED ORDER — METHYLPREDNISOLONE SODIUM SUCC 125 MG IJ SOLR
60.0000 mg | Freq: Two times a day (BID) | INTRAMUSCULAR | Status: DC
  Administered 2012-02-10 – 2012-02-12 (×4): 60 mg via INTRAVENOUS
  Filled 2012-02-10 (×6): qty 0.96

## 2012-02-10 MED ORDER — LEVOFLOXACIN IN D5W 500 MG/100ML IV SOLN
500.0000 mg | INTRAVENOUS | Status: DC
  Administered 2012-02-10 – 2012-02-11 (×2): 500 mg via INTRAVENOUS
  Filled 2012-02-10 (×3): qty 100

## 2012-02-10 MED ORDER — HYDROCODONE-ACETAMINOPHEN 5-325 MG PO TABS
1.0000 | ORAL_TABLET | ORAL | Status: DC | PRN
Start: 2012-02-10 — End: 2012-02-14
  Administered 2012-02-12 – 2012-02-13 (×3): 2 via ORAL
  Filled 2012-02-10 (×3): qty 2

## 2012-02-10 MED ORDER — SODIUM CHLORIDE 0.9 % IV SOLN
INTRAVENOUS | Status: AC
  Administered 2012-02-10: 21:00:00 via INTRAVENOUS

## 2012-02-10 MED ORDER — ACETAMINOPHEN 650 MG RE SUPP: 650.0000 mg | Freq: Four times a day (QID) | RECTAL | Status: DC | PRN

## 2012-02-10 MED ORDER — ENOXAPARIN SODIUM 40 MG/0.4ML ~~LOC~~ SOLN
40.0000 mg | SUBCUTANEOUS | Status: DC
  Administered 2012-02-10 – 2012-02-13 (×4): 40 mg via SUBCUTANEOUS
  Filled 2012-02-10 (×5): qty 0.4

## 2012-02-10 MED ORDER — ALBUTEROL SULFATE (5 MG/ML) 0.5% IN NEBU: 5.0000 mg | INHALATION_SOLUTION | Freq: Once | RESPIRATORY_TRACT | Status: DC

## 2012-02-10 MED ORDER — ALBUTEROL SULFATE (5 MG/ML) 0.5% IN NEBU
INHALATION_SOLUTION | RESPIRATORY_TRACT | Status: AC
  Filled 2012-02-10: qty 2

## 2012-02-10 MED ORDER — IPRATROPIUM BROMIDE 0.02 % IN SOLN
0.5000 mg | RESPIRATORY_TRACT | Status: DC
  Administered 2012-02-10 – 2012-02-14 (×23): 0.5 mg via RESPIRATORY_TRACT
  Filled 2012-02-10 (×23): qty 2.5

## 2012-02-10 MED ORDER — IOHEXOL 350 MG/ML SOLN
100.0000 mL | Freq: Once | INTRAVENOUS | Status: AC | PRN
  Administered 2012-02-10: 100 mL via INTRAVENOUS

## 2012-02-10 MED ORDER — SODIUM CHLORIDE 0.9 % IJ SOLN
3.0000 mL | Freq: Two times a day (BID) | INTRAMUSCULAR | Status: DC
  Administered 2012-02-11 – 2012-02-14 (×5): 3 mL via INTRAVENOUS

## 2012-02-10 MED ORDER — IPRATROPIUM BROMIDE 0.02 % IN SOLN
0.5000 mg | Freq: Once | RESPIRATORY_TRACT | Status: AC
  Administered 2012-02-10: 0.5 mg via RESPIRATORY_TRACT
  Filled 2012-02-10: qty 2.5

## 2012-02-10 MED ORDER — HYDROCOD POLST-CHLORPHEN POLST 10-8 MG/5ML PO LQCR
5.0000 mL | Freq: Once | ORAL | Status: AC
  Administered 2012-02-10: 5 mL via ORAL
  Filled 2012-02-10: qty 5

## 2012-02-10 NOTE — ED Provider Notes (Signed)
See prior note   Ward Givens, MD 02/10/12 1645

## 2012-02-10 NOTE — ED Notes (Signed)
Hx asthma- starting getting short of breath and wheezy last night. Was on prednisone until 8 months ago-- hx pulmonary embolism, DVTs- has had 6 "in each leg"

## 2012-02-10 NOTE — ED Provider Notes (Signed)
History     CSN: [id number]  Arrival date & time 02/10/12  1135   First MD Initiated Contact with Patient 02/10/12 1204      Chief Complaint  Patient presents with  . asthma attack     (Consider location/radiation/quality/duration/timing/severity/associated sxs/prior treatment) HPI Comments: 51 year old male with a history of asthma ,DVT and pulmonary embolism presents to the emergency department complaining of audible wheezing associated with productive cough rapidly worsening in the last 24 hours and unrelieved by albuterol and Combivent.   Patient is a 51 y.o. male presenting with wheezing. The history is provided by the patient.  Wheezing  The current episode started yesterday (1 mo ago, "but not bad"). Onset quality: gradual onset with sudden change. The problem occurs continuously. The problem has been gradually worsening. The problem is moderate. The symptoms are relieved by beta-agonist inhalers (only last 15-20 min, new inhaler used up in 3 days). The symptoms are aggravated by activity (smoking ). Associated symptoms include chest pressure (CP 4/10, hurts when cough, "like someone poking me", lasting seconds, no radiation), cough, shortness of breath and wheezing. Pertinent negatives include no orthopnea, no fever, no rhinorrhea, no sore throat and no stridor. The cough is productive and hacking. Color change with cough: yellow/green. Nothing relieves the cough. The cough is worsened by activity and smoke exposure. Steroid use: 1 year ago  He has had prior hospitalizations. He has had prior ICU admissions. He has had no prior intubations. There were no sick contacts. He has received no recent medical care.    Past Medical History  Diagnosis Date  . Asthma   . DVT (deep venous thrombosis)   . Pulmonary embolism     History reviewed. No pertinent past surgical history.  No family history on file.  History  Substance Use Topics  . Smoking status: Current Some Day Smoker --  0.2 packs/day  . Smokeless tobacco: Not on file  . Alcohol Use: Yes     Comment: occasionally      Review of Systems  Constitutional: Negative for fever.  HENT: Negative for sore throat and rhinorrhea.   Respiratory: Positive for cough, shortness of breath and wheezing. Negative for stridor.   Cardiovascular: Negative for orthopnea.    Allergies  Review of patient's allergies indicates no known allergies.  Home Medications   Current Outpatient Rx  Name  Route  Sig  Dispense  Refill  . ALBUTEROL SULFATE HFA 108 (90 BASE) MCG/ACT IN AERS   Inhalation   Inhale 2 puffs into the lungs every 6 (six) hours as needed. Shortness of breath         . IPRATROPIUM-ALBUTEROL 20-100 MCG/ACT IN AERS   Inhalation   Inhale 1 puff into the lungs every 6 (six) hours.           BP 114/91  Pulse 95  Temp 98.7 F (37.1 C) (Oral)  Resp 22  SpO2 100%  Physical Exam  Constitutional: He is oriented to person, place, and time. He appears well-developed and well-nourished. No distress.  HENT:  Head: Normocephalic and atraumatic.       Uvula midline, oropharynx clear and moist  Eyes: Conjunctivae normal and EOM are normal. Pupils are equal, round, and reactive to light.  Neck: Normal range of motion.       No stridor.  Neck supple with full range of motion.  Cardiovascular: Normal rate, regular rhythm, normal heart sounds and intact distal pulses.   Pulmonary/Chest: Effort normal. He has wheezes. He  exhibits tenderness.       Patient not in mild respiratory distress w audible wheezing. Mild accessory muscle use. No nasal flaring.  Patient able to speak in full sentences, but interrupted with coughing.  Airway intact.  Abdominal: Soft. There is no tenderness.  Musculoskeletal: Normal range of motion. He exhibits no edema and no tenderness.  Neurological: He is alert and oriented to person, place, and time.  Skin: Skin is warm and dry. No rash noted. He is not diaphoretic.  Psychiatric: He  has a normal mood and affect. His behavior is normal.    ED Course  Procedures (including critical care time)   Labs Reviewed  POCT I-STAT TROPONIN I   Dg Chest 2 View  02/10/2012  *RADIOLOGY REPORT*  Clinical Data: Cough, chest pain  CHEST - 2 VIEW  Comparison: 07/02/2010  Findings: Lungs are essentially clear.  No focal consolidation. No pleural effusion or pneumothorax.  Cardiomediastinal silhouette is within normal limits.  Degenerative changes of the visualized thoracolumbar spine.  IMPRESSION: No evidence of acute cardiopulmonary disease.   Original Report Authenticated By: Charline Bills, M.D.      No diagnosis found.  2:08 PM- receiving hour long neb   2:56 PM - neb completed, still audible wheezing. Will have nurse ambulate with pulse ox and try repeat neb. Dispositions pending re-assessment.   3:03 PM- pulse ox stayed > 95% w ambulation per nursing   4:19 PM- Pt still audibly wheezing   Date: 02/10/2012  Rate: 89  Rhythm: normal sinus rhythm  QRS Axis: normal  Intervals: borderline QT  ST/T Wave abnormalities: normal  Conduction Disutrbances:none  Narrative Interpretation:   Old EKG Reviewed: changes noted   MDM  51 yo M smoker w hx of asthma, DVT & PE presents to ER c/o worsening asthma over the last mo w rapid decline over the last 24 hours. Associated w productive cough. Pt denies any CP, hemoptysis, pleurisy or calf pain/swelling. Pt states that this feels like past asthma exacerbations and not like PE. Vitals, hx, & PE not consistent with pulmonary embolism. BP 112/88  Pulse 80  Temp 98.7 F (37.1 C) (Oral)  Resp 18  SpO2 99%. Will treat pt w nebulizer and re-evaluate. Case discussed with attending who agrees w plan.   Pt without significant improvement after hospital therapy. Would benefit from over night observation & continuous nebs.The patient appears reasonably stabilized for admission considering the current resources, flow, and capabilities  available in the ED at this time, and I doubt any other [facility] requiring further screening and/or treatment in the ED prior to admission.        Jaci Carrel, [place] 02/10/12 1625

## 2012-02-10 NOTE — Progress Notes (Signed)
VASCULAR LAB PRELIMINARY  PRELIMINARY  PRELIMINARY  PRELIMINARY  Bilateral lower extremity venous Dopplers completed.    Preliminary report:  There is no acute DVT or SVT noted in the bilateral lower extremities.  There appears to be chronic DVT in the femoral, popliteal and the posterior tibial veins in the left lower extremity.  [name], 02/10/2012, 7:23 PM

## 2012-02-10 NOTE — ED Notes (Signed)
RT called for treatment

## 2012-02-10 NOTE — H&P (Signed)
[facility] History and Physical  [name] WNU:[id number] DOB: 06/26/1960 DOA: 02/10/2012  Referring physician: ER physician PCP: No primary provider on file.   Chief Complaint: shortness of breath  HPI:  51 year old male with past medical history of asthma, COPD (active smoker), pulmonary embolism and DVT (was on anticoagulation but has stopped few months ago on his own) who now presents with worsening shortness of breath over past few days associated with cough productive of yellowish to greenish sputum. Patient reported subjective fevers at home only and occasional chills. Patient reports he experiences chest pain only when coughing otherwise no chest pain, no palpitations. No abdominal pain, no nausea or vomiting, no diarrhea or constipation. No blood in stool or urine.  Assessment and Plan:  Principal Problem:  *Asthma with COPD with exacerbation  COPD order set in place  We will continue bronchodilator treatments Q 2 hours PRN and Q 4 hours Scheduled  Start solumedrol 60 mg Q 12 hours  Start Levaquin for possible pneumonia  Oxygen support via nasal canula to keep O2 saturation above 90%  Smoking cessation counseling  Active Problems:  Personal history of pulmonary embolism  Patient has stopped anticoagulation on his own  We will repeat CT angio chest and DVT bilateral LE and assess the need for anticoagulation  Code Status: Full Family Communication: Pt at bedside Disposition Plan: Admit for further evaluation; PT evaluation; we need all the admission labs  Manson Passey, MD  [facility] Pager [phone]  If 7PM-7AM, please contact night-coverage [email or web address] Password [email or web address] 02/10/2012, 4:51 PM  Review of Systems:  Constitutional: Negative for fever, chills and malaise/fatigue. Negative for diaphoresis.  HENT: Negative for hearing loss, ear pain, nosebleeds, congestion, sore throat, neck pain, tinnitus and ear discharge.   Eyes: Negative for blurred  vision, double vision, photophobia, pain, discharge and redness.  Respiratory: per HPI.   Cardiovascular: Negative for chest pain, palpitations, orthopnea, claudication and leg swelling.  Gastrointestinal: Negative for nausea, vomiting and abdominal pain. Negative for heartburn, constipation, blood in stool and melena.  Genitourinary: Negative for dysuria, urgency, frequency, hematuria and flank pain.  Musculoskeletal: Negative for myalgias, back pain, joint pain and falls.  Skin: Negative for itching and rash.  Neurological: Negative for dizziness and weakness. Negative for tingling, tremors, sensory change, speech change, focal weakness, loss of consciousness and headaches.  Endo/Heme/Allergies: Negative for environmental allergies and polydipsia. Does not bruise/bleed easily.  Psychiatric/Behavioral: Negative for suicidal ideas. The patient is not nervous/anxious.      Past Medical History  Diagnosis Date  . Asthma   . DVT (deep venous thrombosis)   . Pulmonary embolism    History reviewed. No pertinent past surgical history. Social History:  reports that he has been smoking.  He does not have any smokeless tobacco history on file. He reports that he drinks alcohol. His drug history not on file.  No Known Allergies  Family History: heart disease in parents  Prior to Admission medications   Medication Sig Start Date End Date Taking? Authorizing Provider  albuterol (PROVENTIL HFA;VENTOLIN HFA) 108 (90 BASE) MCG/ACT inhaler Inhale 2 puffs into the lungs every 6 (six) hours as needed. Shortness of breath   Yes Historical Provider, MD  Ipratropium-Albuterol (COMBIVENT RESPIMAT) 20-100 MCG/ACT AERS respimat Inhale 1 puff into the lungs every 6 (six) hours.   Yes Historical Provider, MD   Physical Exam: Filed Vitals:   02/10/12 1144 02/10/12 1156 02/10/12 1419  BP: 130/107 114/91 112/88  Pulse: 100 95 80  Temp:  98.7 F (37.1 C)    TempSrc: Oral    Resp: 24 22 18   SpO2: 95% 100% 99%     Physical Exam  Constitutional: Appears well-developed and well-nourished. No distress.  HENT: Normocephalic. External right and left ear normal. Oropharynx is clear and moist.  Eyes: Conjunctivae and EOM are normal. PERRLA, no scleral icterus.  Neck: Normal ROM. Neck supple. No JVD. No tracheal deviation. No thyromegaly.  CVS: RRR, S1/S2 +, no murmurs, no gallops, no carotid bruit.  Pulmonary: wheezing in upper and mid lung lobes; basilar crackles.  Abdominal: Soft. BS +,  no distension, tenderness, rebound or guarding.  Musculoskeletal: Normal range of motion. No edema and no tenderness.  Lymphadenopathy: No lymphadenopathy noted, cervical, inguinal. Neuro: Alert. Normal reflexes, muscle tone coordination. No cranial nerve deficit. Skin: Skin is warm and dry. No rash noted. Not diaphoretic. No erythema. No pallor.  Psychiatric: Normal mood and affect. Behavior, judgment, thought content normal.   Labs on Admission:  Basic Metabolic Panel: No results found for this basename: NA:5,K:5,CL:5,CO2:5,GLUCOSE:5,BUN:5,CREATININE:5,CALCIUM:5,MG:5,PHOS:5 in the last 168 hours Liver Function Tests: No results found for this basename: AST:5,ALT:5,ALKPHOS:5,BILITOT:5,PROT:5,ALBUMIN:5 in the last 168 hours No results found for this basename: LIPASE:5,AMYLASE:5 in the last 168 hours No results found for this basename: AMMONIA:5 in the last 168 hours CBC: No results found for this basename: WBC:5,NEUTROABS:5,HGB:5,HCT:5,MCV:5,PLT:5 in the last 168 hours Cardiac Enzymes: No results found for this basename: CKTOTAL:5,CKMB:5,CKMBINDEX:5,TROPONINI:5 in the last 168 hours BNP: No components found with this basename: POCBNP:5 CBG: No results found for this basename: GLUCAP:5 in the last 168 hours  Radiological Exams on Admission: Dg Chest 2 View 02/10/2012  *  IMPRESSION: No evidence of acute cardiopulmonary disease.   Original Report Authenticated By: Charline Bills, M.D.     EKG: Normal sinus  rhythm, no ST/T wave changes  Time spent: 75 minutes

## 2012-02-10 NOTE — ED Notes (Signed)
Paz, PA at bedside.  

## 2012-02-10 NOTE — ED Notes (Signed)
Pt trasported to CT via stretcher with tech

## 2012-02-10 NOTE — ED Notes (Signed)
HQI:ON62<XB> Expected date:<BR> Expected time:<BR> Means of arrival:<BR> Comments:<BR> Triage pt w asthma

## 2012-02-10 NOTE — ED Notes (Signed)
Milks, RT at bedside

## 2012-02-10 NOTE — ED Notes (Signed)
RT at bedside.

## 2012-02-10 NOTE — ED Notes (Signed)
Pt. Walk well around the emergency room and his oxygen level was 96.

## 2012-02-10 NOTE — ED Provider Notes (Signed)
Pt has hx of RAD, states his breathing has been worse for the past month but got acutely a lot worse 2 days ago. Pt has hx of ICU admissions in past.  PT examined after a continuous nebulizer and he still has diffuse wheezing. Has mild retractions.    Medical screening examination/treatment/procedure(s) were conducted as a shared visit with non-physician practitioner(s) and myself.  I personally evaluated the patient during the encounter  Devoria Albe, MD, Franz Dell, MD 02/10/12 [phone]

## 2012-02-11 DIAGNOSIS — R0602 Shortness of breath: Secondary | ICD-10-CM

## 2012-02-11 DIAGNOSIS — J45901 Unspecified asthma with (acute) exacerbation: Secondary | ICD-10-CM

## 2012-02-11 LAB — COMPREHENSIVE METABOLIC PANEL
AST: 30 U/L (ref 0–37)
Albumin: 2.7 g/dL — ABNORMAL LOW (ref 3.5–5.2)
Calcium: 9.8 mg/dL (ref 8.4–10.5)
Chloride: 104 mEq/L (ref 96–112)
Creatinine, Ser: 0.58 mg/dL (ref 0.50–1.35)
Total Bilirubin: 0.2 mg/dL — ABNORMAL LOW (ref 0.3–1.2)
Total Protein: 6.4 g/dL (ref 6.0–8.3)

## 2012-02-11 LAB — TSH: TSH: 0.323 u[IU]/mL — ABNORMAL LOW (ref 0.350–4.500)

## 2012-02-11 LAB — CBC
HCT: 39.8 % (ref 39.0–52.0)
Hemoglobin: 13.6 g/dL (ref 13.0–17.0)
MCH: 31.2 pg (ref 26.0–34.0)
MCV: 91.3 fL (ref 78.0–100.0)
RBC: 4.36 MIL/uL (ref 4.22–5.81)

## 2012-02-11 LAB — HEMOGLOBIN A1C
Hgb A1c MFr Bld: 5.6 % (ref <5.7)
Mean Plasma Glucose: 114 mg/dL (ref <117)

## 2012-02-11 MED ORDER — INFLUENZA VIRUS VACC SPLIT PF IM SUSP
0.5000 mL | INTRAMUSCULAR | Status: AC
  Administered 2012-02-11: 0.5 mL via INTRAMUSCULAR
  Filled 2012-02-11: qty 0.5

## 2012-02-11 NOTE — Progress Notes (Signed)
*  PRELIMINARY RESULTS* Echocardiogram 2D Echocardiogram has been performed.  Hasan Douse 02/11/2012, 11:33 AM

## 2012-02-11 NOTE — Progress Notes (Signed)
UR Completed.  [name] Jane [phone] 02/11/2012  

## 2012-02-11 NOTE — Progress Notes (Signed)
PT Cancellation Note  Patient Details Name: [name] MRN: [id number] DOB: 03-29-1960   Cancelled Treatment:    Reason Eval/Treat Not Completed: Other (comment) (No need for PT evaluation; mobility at baseline)   WYNN,CYNDI 02/11/2012, 11:40 AM

## 2012-02-11 NOTE — Progress Notes (Signed)
[facility] PROGRESS NOTE  Alias [name]:[id number] DOB: 12/05/1960 DOA: 02/10/2012 PCP: No primary provider on file.  Assessment/Plan: Asthma with COPD with exacerbation  COPD order set in place  We will continue bronchodilator treatments Q 2 hours PRN and Q 4 hours Scheduled  Start solumedrol 60 mg Q 12 hours  Start Levaquin for possible pneumonia  Oxygen support via nasal canula to keep O2 saturation above 90%  Smoking cessation counseling  Code Status: full code Family Communication none at bedside Disposition Plan: pending.     Antibiotics:  levaquin 11/16>>>  HPI/Subjective: Comfortable.   Objective: Filed Vitals:   02/11/12 0526 02/11/12 0800 02/11/12 1402 02/11/12 1605  BP: 137/85  147/94   Pulse: 65  73   Temp: 97.5 F (36.4 C)  98 F (36.7 C)   TempSrc: Oral  Oral   Resp: 18  20   Height:      Weight: 80.2 kg (176 lb 12.9 oz)     SpO2: 99% 94% 99% 98%    Intake/Output Summary (Last 24 hours) at 02/11/12 1856 Last data filed at 02/11/12 1404  Gross per 24 hour  Intake 1244.5 ml  Output      0 ml  Net 1244.5 ml   Filed Weights   02/10/12 2023 02/11/12 0526  Weight: 79.6 kg (175 lb 7.8 oz) 80.2 kg (176 lb 12.9 oz)    Exam: Constitutional: Appears well-developed and well-nourished. No distress.  CVS: RRR, S1/S2 +, no murmurs, no gallops, no carotid bruit.  Pulmonary: wheezing in upper and mid lung lobes; basilar crackles.  Abdominal: Soft. BS +, no distension, tenderness, rebound or guarding.  Musculoskeletal: Normal range of motion. No edema and no tenderness.    Data Reviewed: Basic Metabolic Panel:  Lab 02/11/12 4401 02/10/12 1909  NA 137 135  K 4.1 3.8  CL 104 102  CO2 24 21  GLUCOSE 134* 190*  BUN 10 8  CREATININE 0.58 0.63  CALCIUM 9.8 9.2  MG -- 1.7  PHOS -- 3.5   Liver Function Tests:  Lab 02/11/12 0525 02/10/12 1909  AST 30 47*  ALT 23 28  ALKPHOS 78 83  BILITOT 0.2* 0.3  PROT 6.4 7.1  ALBUMIN 2.7* 3.1*    No results found for this basename: LIPASE:5,AMYLASE:5 in the last 168 hours No results found for this basename: AMMONIA:5 in the last 168 hours CBC:  Lab 02/11/12 0525 02/10/12 1909  WBC 3.5* 2.9*  NEUTROABS -- 2.5  HGB 13.6 14.2  HCT 39.8 40.5  MCV 91.3 90.0  PLT 146* 154   Cardiac Enzymes: No results found for this basename: CKTOTAL:5,CKMB:5,CKMBINDEX:5,TROPONINI:5 in the last 168 hours BNP (last 3 results) No results found for this basename: PROBNP:3 in the last 8760 hours CBG:  Lab 02/11/12 0711  GLUCAP 131*    No results found for this or any previous visit (from the past 240 hour(s)).   Studies: Dg Chest 2 View  02/10/2012  *RADIOLOGY REPORT*  Clinical Data: Cough, chest pain  CHEST - 2 VIEW  Comparison: 07/02/2010  Findings: Lungs are essentially clear.  No focal consolidation. No pleural effusion or pneumothorax.  Cardiomediastinal silhouette is within normal limits.  Degenerative changes of the visualized thoracolumbar spine.  IMPRESSION: No evidence of acute cardiopulmonary disease.   Original Report Authenticated By: Charline Bills, M.D.    Ct Angio Chest Pe W/cm &/or Wo Cm  02/10/2012  *RADIOLOGY REPORT*  Clinical Data: Cough and shortness of breath.  History of pulmonary embolism.  CT ANGIOGRAPHY CHEST  Technique:  Multidetector CT imaging of the chest using the standard protocol during bolus administration of intravenous contrast. Multiplanar reconstructed images including MIPs were obtained and reviewed to evaluate the vascular anatomy.  Contrast: OMNIPAQUE IOHEXOL 350 MG/ML SOLN  Comparison: CT of the thorax 06/29/2010.  Findings:  Mediastinum: There are no filling defects within the pulmonary arterial tree to suggest underlying pulmonary embolism. Heart size is normal. There is no significant pericardial fluid, thickening or pericardial calcification. There is atherosclerosis of the thoracic aorta, the great vessels of the mediastinum and the coronary  arteries, including calcified atherosclerotic plaque in the left anterior descending and ramus intermedius coronary arteries. No pathologically enlarged mediastinal or hilar lymph nodes. Numerous prominent but nonpathologically enlarged mediastinal lymph nodes are conspicuous in number rather than size.  Esophagus is unremarkable in appearance.  Lungs/Pleura: Mild diffuse bronchial wall thickening.  There are scattered areas of mucoid impaction within bronchi, most pronounced in the upper lobes of the lungs bilaterally (left greater than right).  No acute consolidative airspace disease.  No pleural effusions.  Upper Abdomen: Unremarkable.  Musculoskeletal: There are no aggressive appearing lytic or blastic lesions noted in the visualized portions of the skeleton.  IMPRESSION: 1.  No pulmonary embolism. 2.  Mild diffuse bronchial wall thickening with scattered areas of mucoid impaction.  This may suggest very mild bronchitis.  No signs to suggest bronchopneumonia at this time.   Original Report Authenticated By: Trudie Reed, M.D.     Scheduled Meds:   . [COMPLETED] sodium chloride   Intravenous STAT  . albuterol  2.5 mg Nebulization Q4H  . enoxaparin (LOVENOX) injection  40 mg Subcutaneous Q24H  . [COMPLETED] influenza  inactive virus vaccine  0.5 mL Intramuscular Tomorrow-1000  . ipratropium  0.5 mg Nebulization Q4H  . levofloxacin (LEVAQUIN) IV  500 mg Intravenous Q24H  . methylPREDNISolone (SOLU-MEDROL) injection  60 mg Intravenous Q12H  . sodium chloride  3 mL Intravenous Q12H   Continuous Infusions:   Principal Problem:  *Asthma with COPD with exacerbation Active Problems:  Personal history of pulmonary embolism        [name]  [facility] Pager [phone]. If 8PM-8AM, please contact night-coverage at [email or web address], password [facility] 02/11/2012, 6:56 PM  LOS: 1 day

## 2012-02-12 LAB — GLUCOSE, CAPILLARY

## 2012-02-12 MED ORDER — PREDNISONE 50 MG PO TABS
60.0000 mg | ORAL_TABLET | Freq: Every day | ORAL | Status: DC
  Administered 2012-02-13 – 2012-02-14 (×2): 60 mg via ORAL
  Filled 2012-02-12 (×3): qty 1

## 2012-02-12 MED ORDER — LEVOFLOXACIN 750 MG PO TABS
750.0000 mg | ORAL_TABLET | ORAL | Status: DC
  Administered 2012-02-12 – 2012-02-13 (×2): 750 mg via ORAL
  Filled 2012-02-12 (×3): qty 1

## 2012-02-12 MED ORDER — HYDROCORTISONE 1 % EX CREA
TOPICAL_CREAM | Freq: Two times a day (BID) | CUTANEOUS | Status: DC
  Administered 2012-02-13 – 2012-02-14 (×3): via TOPICAL
  Filled 2012-02-12: qty 28

## 2012-02-12 NOTE — Progress Notes (Signed)
[facility] PROGRESS NOTE  [name] ZOX:[id number] DOB: September 09, 1960 DOA: 02/10/2012 PCP: No primary provider on file.  Assessment/Plan: Asthma with COPD with exacerbation  COPD order set in place  We will continue bronchodilator treatments Q 2 hours PRN and Q 4 hours Scheduled  Started solumedrol 60 mg Q 12 hours, and he is on prednisone taper. Start Levaquin for possible pneumonia  Oxygen support via nasal canula to keep O2 saturation above 90%  Smoking cessation counseling  Rash on the back: since many months, associated with itching. Cortisone cream. Code Status: full code Family Communication none at bedside Disposition Plan: pending.     Antibiotics:  levaquin 11/16>>>  HPI/Subjective: Comfortable.   Objective: Filed Vitals:   02/12/12 0816 02/12/12 1445 02/12/12 1957 02/12/12 2021  BP:  158/90  150/87  Pulse:  65  63  Temp:  97.8 F (36.6 C)  98.2 F (36.8 C)  TempSrc:  Oral  Oral  Resp:  16  18  Height:      Weight:      SpO2: 95% 99% 98% 97%    Intake/Output Summary (Last 24 hours) at 02/12/12 2028 Last data filed at 02/12/12 1500  Gross per 24 hour  Intake    940 ml  Output      0 ml  Net    940 ml   Filed Weights   02/10/12 2023 02/11/12 0526  Weight: 79.6 kg (175 lb 7.8 oz) 80.2 kg (176 lb 12.9 oz)    Exam: Constitutional: Appears well-developed and well-nourished. No distress.  CVS: RRR, S1/S2 +, no murmurs, no gallops, no carotid bruit.  Pulmonary: wheezing improved.  Abdominal: Soft. BS +, no distension, tenderness, rebound or guarding.  Musculoskeletal: Normal range of motion. No edema and no tenderness.    Data Reviewed: Basic Metabolic Panel:  Lab 02/11/12 8119 02/10/12 1909  NA 137 135  K 4.1 3.8  CL 104 102  CO2 24 21  GLUCOSE 134* 190*  BUN 10 8  CREATININE 0.58 0.63  CALCIUM 9.8 9.2  MG -- 1.7  PHOS -- 3.5   Liver Function Tests:  Lab 02/11/12 0525 02/10/12 1909  AST 30 47*  ALT 23 28  ALKPHOS 78 83    BILITOT 0.2* 0.3  PROT 6.4 7.1  ALBUMIN 2.7* 3.1*   No results found for this basename: LIPASE:5,AMYLASE:5 in the last 168 hours No results found for this basename: AMMONIA:5 in the last 168 hours CBC:  Lab 02/11/12 0525 02/10/12 1909  WBC 3.5* 2.9*  NEUTROABS -- 2.5  HGB 13.6 14.2  HCT 39.8 40.5  MCV 91.3 90.0  PLT 146* 154   Cardiac Enzymes: No results found for this basename: CKTOTAL:5,CKMB:5,CKMBINDEX:5,TROPONINI:5 in the last 168 hours BNP (last 3 results) No results found for this basename: PROBNP:3 in the last 8760 hours CBG:  Lab 02/12/12 0725 02/11/12 0711  GLUCAP 162* 131*    No results found for this or any previous visit (from the past 240 hour(s)).   Studies: No results found.  Scheduled Meds:    . albuterol  2.5 mg Nebulization Q4H  . enoxaparin (LOVENOX) injection  40 mg Subcutaneous Q24H  . ipratropium  0.5 mg Nebulization Q4H  . levofloxacin  750 mg Oral Q24H  . predniSONE  60 mg Oral QAC breakfast  . sodium chloride  3 mL Intravenous Q12H  . [DISCONTINUED] levofloxacin (LEVAQUIN) IV  500 mg Intravenous Q24H  . [DISCONTINUED] methylPREDNISolone (SOLU-MEDROL) injection  60 mg Intravenous Q12H   Continuous  Infusions:   Principal Problem:  *Asthma with COPD with exacerbation Active Problems:  Personal history of pulmonary embolism        [name]  [facility] Pager [phone]. If 8PM-8AM, please contact night-coverage at [email or web address], password [facility] 02/12/2012, 8:28 PM  LOS: 2 days

## 2012-02-12 NOTE — Progress Notes (Signed)
OT Screen Note  Patient Details Name: Laiken Silos MRN: [id number] DOB: 1961-01-09   Cancelled Treatment:    Reason Eval/Treat Not Completed: Other (comment) (OT screen) Pt states he feels he is at baseline with ADL and doesn't feel he needs acute OT. States girlfriend is nearby for PRN assist also. Will sign off per pt request.  Lennox Laity [phone] 02/12/2012, 12:22 PM

## 2012-02-12 NOTE — Progress Notes (Signed)
Nutrition Brief Note  Patient identified on the Malnutrition Screening Tool (MST) Report and received consult.   Body mass index is 22.70 kg/(m^2). Pt meets criteria for normal healthy weight based on current BMI.   Current diet order is heart healthy, patient is consuming approximately 100% of meals at this time. Labs and medications reviewed. Pt reports eating 3 meals/day PTA with stable weight. Pt reports having excellent appetite.   No nutrition interventions warranted at this time. If nutrition issues arise, please consult RD.   Levon Hedger MS, RD, LDN [phone] Pager [phone] After Hours Pager

## 2012-02-12 NOTE — Care Management Note (Addendum)
    Page 1 of 1   02/14/2012     4:19:13 PM   CARE MANAGEMENT NOTE 02/14/2012  Patient:  [name],[name]   Account Number:  [id number]  Date Initiated:  02/12/2012  Documentation initiated by:  Lanier Clam  Subjective/Objective Assessment:   ADMITTED W/SOB.     Action/Plan:   FROM HOME W/SUPPORT.HAS PCP,PHARMACY.HAS HOME 02-[facility].   Anticipated DC Date:  02/14/2012   Anticipated DC Plan:  HOME/SELF CARE      DC Planning Services  CM consult      Choice offered to / List presented to:             Status of service:  Completed, signed off Medicare Important Message given?   (If response is "NO", the following Medicare IM given date fields will be blank) Date Medicare IM given:   Date Additional Medicare IM given:    Discharge Disposition:  HOME/SELF CARE  Per UR Regulation:  Reviewed for med. necessity/level of care/duration of stay  If discussed at Long Length of Stay Meetings, dates discussed:    Comments:  02/14/12 Abhijot Straughter RN,BSN NCM [phone] PATIENT ASKED ABOUT INHALER,& SINUS MED TO BE GIVEN SINCE UNABLE TO AFFORD $3 CO-PAY,& CHECK WILL BE GIVEN NEXT MONTH.PER MD PATIENT WAS NOT ON INHALER,& SINUS MED FOR 2 DAYS,BUT WILL WRITE SCRIPT.INDIGENT FUND ONLY AVAILABLE FOR PATIENT'S W/NO INSURANCE.PATIENT COMPREHENDED.ASKED IF HE COULD TALK TO FAMILY/FRIENDS FOR ASSISTANCE W/CO-PAY,HE WILL.  02/12/12 Anaisha Mago RN,BSN NCM [phone] PT/OT-NA.NO ANTICIPATED D/C NEEDS.

## 2012-02-12 NOTE — Progress Notes (Signed)
This patient is receiving the antibiotic(s) Levaquin by the intravenous route. Based on criteria approved by the Pharmacy and Therapeutics Committee, and the Infectious Disease Division, the antibiotic(s) is / are being converted to equivalent oral dose form(s). These criteria include: . Patient being treated for a respiratory tract infection, urinary tract infection, cellulitis, or Clostridium Difficile Associated Diarrhea . The patient is not neutropenic and does not exhibit a GI malabsorption state . The patient is eating (either orally or per tube) and/or has been taking other orally administered medications for at least 24 hours. . The patient is improving clinically (physician assessment and a 24-hour Tmax of < 100.5 F).  If you have questions about this conversion, please contact the pharmacy department. Thank you.  Clance Boll, PharmD, BCPS Pager: [phone] 02/12/2012 1:40 PM

## 2012-02-13 ENCOUNTER — Inpatient Hospital Stay (HOSPITAL_COMMUNITY): Payer: Medicaid Other

## 2012-02-13 DIAGNOSIS — R109 Unspecified abdominal pain: Secondary | ICD-10-CM | POA: Diagnosis present

## 2012-02-13 DIAGNOSIS — R21 Rash and other nonspecific skin eruption: Secondary | ICD-10-CM | POA: Diagnosis present

## 2012-02-13 LAB — BASIC METABOLIC PANEL
CO2: 26 mEq/L (ref 19–32)
Calcium: 9.5 mg/dL (ref 8.4–10.5)
Chloride: 101 mEq/L (ref 96–112)
GFR calc Af Amer: >90 mL/min (ref >90)
Sodium: 136 mEq/L (ref 135–145)

## 2012-02-13 LAB — CBC
Hemoglobin: 14.2 g/dL (ref 13.0–17.0)
MCHC: 34.6 g/dL (ref 30.0–36.0)
Platelets: 169 10*3/uL (ref 150–400)
RBC: 4.49 MIL/uL (ref 4.22–5.81)

## 2012-02-13 LAB — LIPASE, BLOOD: Lipase: 23 U/L (ref 11–59)

## 2012-02-13 MED ORDER — GI COCKTAIL ~~LOC~~
30.0000 mL | Freq: Once | ORAL | Status: AC
  Administered 2012-02-13: 30 mL via ORAL
  Filled 2012-02-13: qty 30

## 2012-02-13 MED ORDER — PANTOPRAZOLE SODIUM 40 MG PO TBEC
40.0000 mg | DELAYED_RELEASE_TABLET | Freq: Every day | ORAL | Status: DC
  Administered 2012-02-13 – 2012-02-14 (×2): 40 mg via ORAL
  Filled 2012-02-13 (×2): qty 1

## 2012-02-13 NOTE — Progress Notes (Signed)
[facility] PROGRESS NOTE  [name] WUJ:[id number] DOB: 03-01-1961 DOA: 02/10/2012 PCP: No primary provider on file.  Assessment/Plan: Asthma with COPD with exacerbation  COPD order set in place  We will continue bronchodilator treatments Q 2 hours PRN and Q 4 hours Scheduled  Started solumedrol 60 mg Q 12 hours, and he is on prednisone taper. His breathing has improved.  Start Levaquin for possible pneumonia  Oxygen support via nasal canula to keep O2 saturation above 90%  Smoking cessation counseling  Rash on the back: since many months, associated with itching. Cortisone cream.  Abdominal pain : abd films ordered, if negative for any acute issues, can be discharged home in am.   Code Status: full code Family Communication none at bedside Disposition Plan: pending.     Antibiotics:  levaquin 11/16>>>  HPI/Subjective: Comfortable.   Objective: Filed Vitals:   02/13/12 0809 02/13/12 1148 02/13/12 1408 02/13/12 1546  BP:   141/81   Pulse:   63   Temp:   98.2 F (36.8 C)   TempSrc:   Oral   Resp:   20   Height:      Weight:      SpO2: 99% 97% 98% 97%    Intake/Output Summary (Last 24 hours) at 02/13/12 1843 Last data filed at 02/13/12 1409  Gross per 24 hour  Intake    483 ml  Output      0 ml  Net    483 ml   Filed Weights   02/10/12 2023 02/11/12 0526 02/13/12 0517  Weight: 79.6 kg (175 lb 7.8 oz) 80.2 kg (176 lb 12.9 oz) 85.2 kg (187 lb 13.3 oz)    Exam: Constitutional: Appears well-developed and well-nourished. No distress.  CVS: RRR, S1/S2 +, no murmurs, no gallops, no carotid bruit.  Pulmonary: wheezing improved.  Abdominal: Soft. BS +, no distension, tenderness, rebound or guarding.  Musculoskeletal: Normal range of motion. No edema and no tenderness.    Data Reviewed: Basic Metabolic Panel:  Lab 02/13/12 9562 02/11/12 0525 02/10/12 1909  NA 136 137 135  K 4.4 4.1 3.8  CL 101 104 102  CO2 26 24 21   GLUCOSE 152* 134* 190*  BUN  19 10 8   CREATININE 0.81 0.58 0.63  CALCIUM 9.5 9.8 9.2  MG -- -- 1.7  PHOS -- -- 3.5   Liver Function Tests:  Lab 02/11/12 0525 02/10/12 1909  AST 30 47*  ALT 23 28  ALKPHOS 78 83  BILITOT 0.2* 0.3  PROT 6.4 7.1  ALBUMIN 2.7* 3.1*    Lab 02/13/12 1730  LIPASE 23  AMYLASE --   No results found for this basename: AMMONIA:5 in the last 168 hours CBC:  Lab 02/13/12 1730 02/11/12 0525 02/10/12 1909  WBC 4.1 3.5* 2.9*  NEUTROABS -- -- 2.5  HGB 14.2 13.6 14.2  HCT 41.0 39.8 40.5  MCV 91.3 91.3 90.0  PLT 169 146* 154   Cardiac Enzymes: No results found for this basename: CKTOTAL:5,CKMB:5,CKMBINDEX:5,TROPONINI:5 in the last 168 hours BNP (last 3 results) No results found for this basename: PROBNP:3 in the last 8760 hours CBG:  Lab 02/12/12 0725 02/11/12 0711  GLUCAP 162* 131*    No results found for this or any previous visit (from the past 240 hour(s)).   Studies: No results found.  Scheduled Meds:    . albuterol  2.5 mg Nebulization Q4H  . enoxaparin (LOVENOX) injection  40 mg Subcutaneous Q24H  . gi cocktail  30 mL Oral Once  .  hydrocortisone cream   Topical BID  . ipratropium  0.5 mg Nebulization Q4H  . levofloxacin  750 mg Oral Q24H  . pantoprazole  40 mg Oral Daily  . predniSONE  60 mg Oral QAC breakfast  . sodium chloride  3 mL Intravenous Q12H   Continuous Infusions:   Principal Problem:  *Asthma with COPD with exacerbation Active Problems:  Personal history of pulmonary embolism        [name]  [facility] Pager [phone]. If 8PM-8AM, please contact night-coverage at [email or web address], password [facility] 02/13/2012, 6:43 PM  LOS: 3 days

## 2012-02-14 DIAGNOSIS — J310 Chronic rhinitis: Secondary | ICD-10-CM

## 2012-02-14 LAB — GLUCOSE, CAPILLARY: Glucose-Capillary: 96 mg/dL (ref 70–99)

## 2012-02-14 LAB — T4, FREE: Free T4: 1.05 ng/dL (ref 0.80–1.80)

## 2012-02-14 MED ORDER — LORATADINE 10 MG PO TABS: 10.0000 mg | ORAL_TABLET | Freq: Every day | ORAL | Status: DC

## 2012-02-14 MED ORDER — PREDNISONE 20 MG PO TABS: ORAL_TABLET | ORAL | Status: DC

## 2012-02-14 MED ORDER — IPRATROPIUM-ALBUTEROL 20-100 MCG/ACT IN AERS: 1.0000 | INHALATION_SPRAY | Freq: Four times a day (QID) | RESPIRATORY_TRACT | Status: DC | PRN

## 2012-02-14 MED ORDER — PANTOPRAZOLE SODIUM 40 MG PO TBEC: 40.0000 mg | DELAYED_RELEASE_TABLET | Freq: Every day | ORAL | Status: DC

## 2012-02-14 MED ORDER — LEVOFLOXACIN 750 MG PO TABS: 750.0000 mg | ORAL_TABLET | ORAL | Status: AC

## 2012-02-14 MED ORDER — HYDROCORTISONE 1 % EX CREA: TOPICAL_CREAM | Freq: Two times a day (BID) | CUTANEOUS | Status: DC

## 2012-02-14 MED ORDER — FLUTICASONE-SALMETEROL 250-50 MCG/DOSE IN AEPB: 1.0000 | INHALATION_SPRAY | Freq: Two times a day (BID) | RESPIRATORY_TRACT | Status: DC

## 2012-02-14 NOTE — Discharge Summary (Signed)
Physician Discharge Summary  Lord Lancour ZOX:[id number] DOB: 1960-05-28 DOA: 02/10/2012  PCP: No primary provider on file.  Admit date: 02/10/2012 Discharge date: 02/14/2012  Time spent: >30 minutes  Recommendations for Outpatient Follow-up:  1. Follow up with PCP in 2 weeks 2. Stop smoking 3. Take medications as prescribed 4. Follow with pulmonology service for further evaluation and treatment of your COPD  Discharge Diagnoses:  Asthma with COPD with exacerbation Tobacco abuse Abdominal  Pain 2/2 GERD Rash of back   Discharge Condition: stable and improved; patient will start advair BID and use combivent as needed. Will follow with PCP and also with pulmonologist at discharge.  Diet recommendation: regular diet  Filed Weights   02/11/12 0526 02/13/12 0517 02/14/12 0519  Weight: 80.2 kg (176 lb 12.9 oz) 85.2 kg (187 lb 13.3 oz) 84.3 kg (185 lb 13.6 oz)    History of present illness:  51 year old male with past medical history of asthma, COPD (active smoker), pulmonary embolism and DVT (was on anticoagulation but has stopped few months ago on his own) who now presents with worsening shortness of breath over past few days associated with cough productive of yellowish to greenish sputum. Patient reported subjective fevers at home only and occasional chills. Patient reports he experiences chest pain only when coughing otherwise no chest pain, no palpitations. No abdominal pain, no nausea or vomiting, no diarrhea or constipation. No blood in stool or urine.   Hospital Course:  1-SOB due to asthma/COPD exacerbation: Improved and stable now to go home and finish antibiotic therapy and steroids tapering as an outpatient. Patient will follow with PCP and also with pulmonology service after discharge. He will also start using advair BID and will use combivent PRN for better control of his symptoms.  2-Tobacco abuse: cessation counseling provided and nicotine patch used while inside the  hospital.  3-GERD: started on protonix daily  4-Rhinitis: will start him on claritin daily  5-Back rash: improved with ongoing prednisone for his COPD and also the use of hydrocortisone cream; will follow with PCP to arrange visit with dermatologist.  Procedures:  See below  Consultations:  none  Discharge Exam: Filed Vitals:   02/13/12 2133 02/14/12 0014 02/14/12 0431 02/14/12 0519  BP: 131/84   129/88  Pulse: 63   61  Temp: 97.9 F (36.6 C)   97.8 F (36.6 C)  TempSrc: Oral   Oral  Resp: 18   18  Height:      Weight:    84.3 kg (185 lb 13.6 oz)  SpO2: 98% 99% 97% 98%    General: Afebrile, NAD; no wheezing. Cardiovascular: S1 and S2; no rubs or gallops Respiratory: good air movement, no wheezing and no crackles Abdomen: soft, NT, ND and positive BS Extremities: no cyanosis and no edema Neuro: non focal  Discharge Instructions  Discharge Orders    Future Orders Please Complete By Expires   Discharge instructions      Comments:   -Take medications as prescribed -Follow with PCP in 2 weeks       Medication List     As of 02/14/2012  1:11 PM    STOP taking these medications         albuterol 108 (90 BASE) MCG/ACT inhaler   Commonly known as: PROVENTIL HFA;VENTOLIN HFA      TAKE these medications         Fluticasone-Salmeterol 250-50 MCG/DOSE Aepb   Commonly known as: ADVAIR   Inhale 1 puff into the  lungs 2 (two) times daily.      hydrocortisone cream 1 %   Apply topically 2 (two) times daily. Back itching      Ipratropium-Albuterol 20-100 MCG/ACT Aers respimat   Commonly known as: COMBIVENT   Inhale 1 puff into the lungs every 6 (six) hours as needed for wheezing or shortness of breath.      levofloxacin 750 MG tablet   Commonly known as: LEVAQUIN   Take 1 tablet (750 mg total) by mouth daily.      loratadine 10 MG tablet   Commonly known as: CLARITIN   Take 1 tablet (10 mg total) by mouth daily.      pantoprazole 40 MG tablet   Commonly  known as: PROTONIX   Take 1 tablet (40 mg total) by mouth daily.      predniSONE 20 MG tablet   Commonly known as: DELTASONE   Take 3 tabs by mouth daily X 1 day; then take 2 tabs by mouth daily X 2 days; then 1 tab by mouth daily X 2 days; then 1/2 tablet by mouth daily X 2 days and stop prednisone.          The results of significant diagnostics from this hospitalization (including imaging, microbiology, ancillary and laboratory) are listed below for reference.    Significant Diagnostic Studies: Dg Chest 2 View  02/10/2012  *RADIOLOGY REPORT*  Clinical Data: Cough, chest pain  CHEST - 2 VIEW  Comparison: 07/02/2010  Findings: Lungs are essentially clear.  No focal consolidation. No pleural effusion or pneumothorax.  Cardiomediastinal silhouette is within normal limits.  Degenerative changes of the visualized thoracolumbar spine.  IMPRESSION: No evidence of acute cardiopulmonary disease.   Original Report Authenticated By: Charline Bills, M.D.    Ct Angio Chest Pe W/cm &/or Wo Cm  02/10/2012  *RADIOLOGY REPORT*  Clinical Data: Cough and shortness of breath.  History of pulmonary embolism.  CT ANGIOGRAPHY CHEST  Technique:  Multidetector CT imaging of the chest using the standard protocol during bolus administration of intravenous contrast. Multiplanar reconstructed images including MIPs were obtained and reviewed to evaluate the vascular anatomy.  Contrast: OMNIPAQUE IOHEXOL 350 MG/ML SOLN  Comparison: CT of the thorax 06/29/2010.  Findings:  Mediastinum: There are no filling defects within the pulmonary arterial tree to suggest underlying pulmonary embolism. Heart size is normal. There is no significant pericardial fluid, thickening or pericardial calcification. There is atherosclerosis of the thoracic aorta, the great vessels of the mediastinum and the coronary arteries, including calcified atherosclerotic plaque in the left anterior descending and ramus intermedius coronary arteries.  No pathologically enlarged mediastinal or hilar lymph nodes. Numerous prominent but nonpathologically enlarged mediastinal lymph nodes are conspicuous in number rather than size.  Esophagus is unremarkable in appearance.  Lungs/Pleura: Mild diffuse bronchial wall thickening.  There are scattered areas of mucoid impaction within bronchi, most pronounced in the upper lobes of the lungs bilaterally (left greater than right).  No acute consolidative airspace disease.  No pleural effusions.  Upper Abdomen: Unremarkable.  Musculoskeletal: There are no aggressive appearing lytic or blastic lesions noted in the visualized portions of the skeleton.  IMPRESSION: 1.  No pulmonary embolism. 2.  Mild diffuse bronchial wall thickening with scattered areas of mucoid impaction.  This may suggest very mild bronchitis.  No signs to suggest bronchopneumonia at this time.   Original Report Authenticated By: Trudie Reed, M.D.    Dg Abd 2 Views  02/13/2012  *RADIOLOGY REPORT*  Clinical Data: Abdominal  pain  ABDOMEN - 2 VIEW  Comparison: None.  Findings: IVC filter in place at the L3 level.  Retained stool is present in the colon of a mild to moderate degree.  No large or small bowel dilatation.  No free air.  Mild degenerative changes in the lumbar spine.  No kidney stones.  IMPRESSION: Mild constipation without bowel obstruction.   Original Report Authenticated By: Janeece Riggers, M.D.    Labs: Basic Metabolic Panel:  Lab 02/13/12 1610 02/11/12 0525 02/10/12 1909  NA 136 137 135  K 4.4 4.1 3.8  CL 101 104 102  CO2 26 24 21   GLUCOSE 152* 134* 190*  BUN 19 10 8   CREATININE 0.81 0.58 0.63  CALCIUM 9.5 9.8 9.2  MG -- -- 1.7  PHOS -- -- 3.5   Liver Function Tests:  Lab 02/11/12 0525 02/10/12 1909  AST 30 47*  ALT 23 28  ALKPHOS 78 83  BILITOT 0.2* 0.3  PROT 6.4 7.1  ALBUMIN 2.7* 3.1*    Lab 02/13/12 1730  LIPASE 23  AMYLASE --   CBC:  Lab 02/13/12 1730 02/11/12 0525 02/10/12 1909  WBC 4.1 3.5* 2.9*    NEUTROABS -- -- 2.5  HGB 14.2 13.6 14.2  HCT 41.0 39.8 40.5  MCV 91.3 91.3 90.0  PLT 169 146* 154   CBG:  Lab 02/14/12 0718 02/12/12 0725 02/11/12 0711  GLUCAP 96 162* 131*       Signed:  Khair Chasteen  [facility] 02/14/2012, 1:11 PM

## 2012-03-22 ENCOUNTER — Inpatient Hospital Stay (HOSPITAL_COMMUNITY)
Admission: EM | Admit: 2012-03-22 | Discharge: 2012-04-02 | DRG: 191 | Disposition: A | Discharge status: Home / Self Care | Payer: Medicaid Other | Attending: Internal Medicine | Admitting: Internal Medicine

## 2012-03-22 ENCOUNTER — Encounter (HOSPITAL_COMMUNITY): Payer: Self-pay

## 2012-03-22 ENCOUNTER — Emergency Department (HOSPITAL_COMMUNITY): Payer: Medicaid Other

## 2012-03-22 DIAGNOSIS — I824Y9 Acute embolism and thrombosis of unspecified deep veins of unspecified proximal lower extremity: Secondary | ICD-10-CM | POA: Diagnosis not present

## 2012-03-22 DIAGNOSIS — Z86711 Personal history of pulmonary embolism: Secondary | ICD-10-CM

## 2012-03-22 DIAGNOSIS — J479 Bronchiectasis, uncomplicated: Secondary | ICD-10-CM | POA: Diagnosis present

## 2012-03-22 DIAGNOSIS — R109 Unspecified abdominal pain: Secondary | ICD-10-CM

## 2012-03-22 DIAGNOSIS — R21 Rash and other nonspecific skin eruption: Secondary | ICD-10-CM

## 2012-03-22 DIAGNOSIS — I82409 Acute embolism and thrombosis of unspecified deep veins of unspecified lower extremity: Secondary | ICD-10-CM

## 2012-03-22 DIAGNOSIS — Z79899 Other long term (current) drug therapy: Secondary | ICD-10-CM

## 2012-03-22 DIAGNOSIS — J111 Influenza due to unidentified influenza virus with other respiratory manifestations: Secondary | ICD-10-CM | POA: Diagnosis present

## 2012-03-22 DIAGNOSIS — Z9119 Patient's noncompliance with other medical treatment and regimen: Secondary | ICD-10-CM

## 2012-03-22 DIAGNOSIS — J45901 Unspecified asthma with (acute) exacerbation: Secondary | ICD-10-CM

## 2012-03-22 DIAGNOSIS — D696 Thrombocytopenia, unspecified: Secondary | ICD-10-CM | POA: Diagnosis present

## 2012-03-22 DIAGNOSIS — F17200 Nicotine dependence, unspecified, uncomplicated: Secondary | ICD-10-CM | POA: Diagnosis present

## 2012-03-22 DIAGNOSIS — J101 Influenza due to other identified influenza virus with other respiratory manifestations: Secondary | ICD-10-CM

## 2012-03-22 DIAGNOSIS — J441 Chronic obstructive pulmonary disease with (acute) exacerbation: Principal | ICD-10-CM

## 2012-03-22 DIAGNOSIS — D72819 Decreased white blood cell count, unspecified: Secondary | ICD-10-CM

## 2012-03-22 DIAGNOSIS — L209 Atopic dermatitis, unspecified: Secondary | ICD-10-CM

## 2012-03-22 DIAGNOSIS — Z91199 Patient's noncompliance with other medical treatment and regimen due to unspecified reason: Secondary | ICD-10-CM

## 2012-03-22 LAB — CBC WITH DIFFERENTIAL/PLATELET
Basophils Absolute: 0 10*3/uL (ref 0.0–0.1)
Basophils Relative: 0 % (ref 0–1)
Eosinophils Absolute: 0 10*3/uL (ref 0.0–0.7)
Hemoglobin: 13.7 g/dL (ref 13.0–17.0)
Lymphocytes Relative: 13 % (ref 12–46)
MCH: 30.7 pg (ref 26.0–34.0)
MCHC: 34 g/dL (ref 30.0–36.0)
Monocytes Absolute: 0.5 10*3/uL (ref 0.1–1.0)
Neutrophils Relative %: 73 % (ref 43–77)
Platelets: UNDETERMINED 10*3/uL (ref 150–400)

## 2012-03-22 LAB — CBC
HCT: 37 % — ABNORMAL LOW (ref 39.0–52.0)
MCH: 31.1 pg (ref 26.0–34.0)
MCHC: 34.3 g/dL (ref 30.0–36.0)
MCV: 90.5 fL (ref 78.0–100.0)
RDW: 13 % (ref 11.5–15.5)

## 2012-03-22 LAB — BASIC METABOLIC PANEL
Calcium: 9.2 mg/dL (ref 8.4–10.5)
GFR calc Af Amer: >90 mL/min (ref >90)
GFR calc non Af Amer: >90 mL/min (ref >90)
Glucose, Bld: 109 mg/dL — ABNORMAL HIGH (ref 70–99)
Potassium: 3.8 mEq/L (ref 3.5–5.1)
Sodium: 135 mEq/L (ref 135–145)

## 2012-03-22 MED ORDER — OSELTAMIVIR PHOSPHATE 75 MG PO CAPS
75.0000 mg | ORAL_CAPSULE | Freq: Two times a day (BID) | ORAL | Status: AC
  Administered 2012-03-23 – 2012-03-27 (×9): 75 mg via ORAL
  Filled 2012-03-22 (×10): qty 1

## 2012-03-22 MED ORDER — HEPARIN SODIUM (PORCINE) 5000 UNIT/ML IJ SOLN
5000.0000 [IU] | Freq: Three times a day (TID) | INTRAMUSCULAR | Status: DC
  Administered 2012-03-22 – 2012-03-30 (×25): 5000 [IU] via SUBCUTANEOUS
  Filled 2012-03-22 (×29): qty 1

## 2012-03-22 MED ORDER — IPRATROPIUM BROMIDE 0.02 % IN SOLN
0.5000 mg | RESPIRATORY_TRACT | Status: DC
  Administered 2012-03-22 – 2012-03-24 (×8): 0.5 mg via RESPIRATORY_TRACT
  Filled 2012-03-22 (×8): qty 2.5

## 2012-03-22 MED ORDER — LORATADINE 10 MG PO TABS
10.0000 mg | ORAL_TABLET | Freq: Every day | ORAL | Status: DC
  Administered 2012-03-23 – 2012-04-02 (×11): 10 mg via ORAL
  Filled 2012-03-22 (×11): qty 1

## 2012-03-22 MED ORDER — PANTOPRAZOLE SODIUM 40 MG PO TBEC
40.0000 mg | DELAYED_RELEASE_TABLET | Freq: Every day | ORAL | Status: DC
  Administered 2012-03-23 – 2012-03-31 (×9): 40 mg via ORAL
  Filled 2012-03-22 (×7): qty 1

## 2012-03-22 MED ORDER — MAGNESIUM SULFATE 40 MG/ML IJ SOLN
2.0000 g | Freq: Once | INTRAMUSCULAR | Status: AC
  Administered 2012-03-22: 2 g via INTRAVENOUS
  Filled 2012-03-22: qty 50

## 2012-03-22 MED ORDER — METHYLPREDNISOLONE SODIUM SUCC 125 MG IJ SOLR
60.0000 mg | Freq: Four times a day (QID) | INTRAMUSCULAR | Status: DC
  Administered 2012-03-22: 23:00:00 via INTRAVENOUS
  Administered 2012-03-23 – 2012-03-25 (×11): 60 mg via INTRAVENOUS
  Filled 2012-03-22: qty 2
  Filled 2012-03-22 (×13): qty 0.96

## 2012-03-22 MED ORDER — MOMETASONE FURO-FORMOTEROL FUM 100-5 MCG/ACT IN AERO
2.0000 | INHALATION_SPRAY | Freq: Two times a day (BID) | RESPIRATORY_TRACT | Status: DC
  Administered 2012-03-23 – 2012-03-31 (×16): 2 via RESPIRATORY_TRACT
  Filled 2012-03-22 (×2): qty 8.8

## 2012-03-22 MED ORDER — ACETAMINOPHEN 325 MG PO TABS
650.0000 mg | ORAL_TABLET | Freq: Four times a day (QID) | ORAL | Status: DC | PRN
  Administered 2012-03-22 – 2012-03-31 (×3): 650 mg via ORAL
  Filled 2012-03-22 (×2): qty 2

## 2012-03-22 MED ORDER — ALBUTEROL SULFATE (5 MG/ML) 0.5% IN NEBU: 2.5000 mg | INHALATION_SOLUTION | RESPIRATORY_TRACT | Status: DC | PRN

## 2012-03-22 MED ORDER — OSELTAMIVIR PHOSPHATE 75 MG PO CAPS
75.0000 mg | ORAL_CAPSULE | Freq: Once | ORAL | Status: AC
  Administered 2012-03-22: 75 mg via ORAL
  Filled 2012-03-22: qty 1

## 2012-03-22 MED ORDER — ALBUTEROL SULFATE (5 MG/ML) 0.5% IN NEBU
2.5000 mg | INHALATION_SOLUTION | RESPIRATORY_TRACT | Status: DC
  Administered 2012-03-22 – 2012-03-24 (×11): 2.5 mg via RESPIRATORY_TRACT
  Filled 2012-03-22: qty 20
  Filled 2012-03-22: qty 0.5
  Filled 2012-03-22: qty 20
  Filled 2012-03-22: qty 0.5
  Filled 2012-03-22 (×2): qty 20
  Filled 2012-03-22 (×5): qty 0.5

## 2012-03-22 MED ORDER — OSELTAMIVIR PHOSPHATE 75 MG PO CAPS: 75.0000 mg | ORAL_CAPSULE | Freq: Once | ORAL | Status: DC

## 2012-03-22 MED ORDER — METHYLPREDNISOLONE SODIUM SUCC 125 MG IJ SOLR
125.0000 mg | Freq: Once | INTRAMUSCULAR | Status: AC
  Administered 2012-03-22: 125 mg via INTRAVENOUS
  Filled 2012-03-22: qty 2

## 2012-03-22 NOTE — ED Notes (Signed)
Admitting physician at bedside

## 2012-03-22 NOTE — ED Notes (Signed)
EMS reports pt with SOB since yesterday, used all inhaler yesterday

## 2012-03-22 NOTE — ED Notes (Signed)
RT at bedside.

## 2012-03-22 NOTE — H&P (Addendum)
[facility] History and Physical  Corbin Falck ZOX:[id number] DOB: [birth year] DOA: 03/22/2012  Referring physician: ED PCP: No primary provider on file.  Specialists: None  Chief Complaint: Asthma exacerbation  HPI: [name] is a 51 y.o. male who presents with c/o asthma attack.  Current episode onset yesterday with continuous symptoms.  It is unrelieved by his rescue inhaler despite taking numerous puffs of it (was well over 100 left on meter when he started and now down to 70).  As a result he presented to the ED for his asthma exacerbation.  In the ED he was initially treated empirically with tamiflu, work up however revealed no PNA despite his fever (and he also had gotten the flu shot earlier this year as well), he was treated with albuterol and 2gm magnesium, hospital ist has been asked to admit.  The patient confesses he hasnt been taking all of his prescribed preventative medications.  Specifically he starts by handing me 2 scripts (for prednisone and combivent) that were given to him at discharge last time that he hadnt gotten filled.  He also hasnt gotten the Advair prescription filled either (and hasnt been taking this).  Review of Systems: Positive for: fever, he has had fever in the past with asthma exacerbations, negative for: muscle aches, URI symptoms, headache, sore throat, abd pain, N/V/D, 12 systems reviewed and otherwise negative.  Past Medical History  Diagnosis Date  . Asthma   . DVT (deep venous thrombosis)   . Pulmonary embolism    History reviewed. No pertinent past surgical history. Social History:  reports that he has been smoking.  He does not have any smokeless tobacco history on file. He reports that he drinks alcohol. His drug history not on file. Has had flu shot earlier this year in October.  No Known Allergies  History reviewed. No pertinent family history.   Prior to Admission medications   Medication Sig Start Date End Date Taking?  Authorizing Provider  Fluticasone-Salmeterol (ADVAIR DISKUS) 250-50 MCG/DOSE AEPB Inhale 1 puff into the lungs 2 (two) times daily. 02/14/12  Yes Vassie Loll, MD  hydrocortisone cream 1 % Apply topically 2 (two) times daily. Back itching 02/14/12  Yes Vassie Loll, MD  Ipratropium-Albuterol (COMBIVENT RESPIMAT) 20-100 MCG/ACT AERS respimat Inhale 1 puff into the lungs every 6 (six) hours as needed for wheezing or shortness of breath. 02/14/12  Yes Vassie Loll, MD  loratadine (CLARITIN) 10 MG tablet Take 1 tablet (10 mg total) by mouth daily. 02/14/12  Yes Vassie Loll, MD  pantoprazole (PROTONIX) 40 MG tablet Take 1 tablet (40 mg total) by mouth daily. 02/14/12  Yes Vassie Loll, MD   Physical Exam: Filed Vitals:   03/22/12 2023 03/22/12 2050 03/22/12 2108 03/22/12 2124  BP:      Pulse:      Temp:    98.9 F (37.2 C)  TempSrc:    Oral  Resp:      SpO2: 98% 99% 98%     General:  NAD, resting comfortably in bed Eyes: PEERLA EOMI ENT: mucous membranes moist Neck: supple w/o JVD Cardiovascular: RRR w/o MRG Respiratory: very wheezy with decreased air movement bilaterally Abdomen: soft, nt, nd, bs+ Skin: no rash nor lesion Musculoskeletal: MAE, full ROM all 4 extremities Psychiatric: normal tone and affect Neurologic: AAOx3, grossly non-focal  Labs on Admission:  Basic Metabolic Panel:  Lab 03/22/12 8119  NA 135  K 3.8  CL 100  CO2 23  GLUCOSE 109*  BUN 8  CREATININE 0.75  CALCIUM 9.2  MG --  PHOS --   Liver Function Tests: No results found for this basename: AST:5,ALT:5,ALKPHOS:5,BILITOT:5,PROT:5,ALBUMIN:5 in the last 168 hours No results found for this basename: LIPASE:5,AMYLASE:5 in the last 168 hours No results found for this basename: AMMONIA:5 in the last 168 hours CBC:  Lab 03/22/12 1858  WBC 3.6*  NEUTROABS 2.6  HGB 13.7  HCT 40.3  MCV 90.4  PLT PLATELET CLUMPS NOTED ON SMEAR, UNABLE TO ESTIMATE   Cardiac Enzymes: No results found for this  basename: CKTOTAL:5,CKMB:5,CKMBINDEX:5,TROPONINI:5 in the last 168 hours  BNP (last 3 results) No results found for this basename: PROBNP:3 in the last 8760 hours CBG: No results found for this basename: GLUCAP:5 in the last 168 hours  Radiological Exams on Admission: Dg Chest 2 View  03/22/2012  *RADIOLOGY REPORT*  Clinical Data: Short of breath  CHEST - 2 VIEW  Comparison: 02/10/2012  Findings: Heart size is normal.  No pleural effusion or edema.  No airspace consolidation.  Chronic bronchitic changes are noted bilaterally.  Visualized osseous structures are unremarkable.  IMPRESSION:  1.  Chronic bronchitic changes. 2.  No pneumonia.   Original Report Authenticated By: Signa Kell, M.D.     EKG: Independently reviewed.  Assessment/Plan Active Problems:  Asthma with COPD with exacerbation   1. Asthma with COPD with exacerbation - Most likely due to non-compliance with preventative medications, he did not fill his prednisone nor combivent scripts provided to him after last visit, he has not been taking any Advair at home despite this being prescribed.  Had a long discussion regarding the use of and need for a daily preventative med.  Will continue breathing treatments as well as IV solumedrol 60q6 for now. 2. Fever - felt to be due to asthma exacerbation at this time, patient has history of fever including high fever to 103 he says with prior asthma exacerbations in the past though he does not always get fever with his exacerbations.  Got 1 dose of tamiflu in ED and will go ahead and continue this for now just in case, but doubtful that this is influenza given the vaccine he got and the fact that the current H1N1 flu going around this season and in the local [place] area appears thus far to be fairly well covered in the vaccine as well as the fact that he clinically lacks many of the typical signs and symptoms of flu other than fever and SOB.  No evidence to suggest bacterial PNA on  CXR.  No consults called  Code Status: Full Code (must indicate code status--if unknown or must be presumed, indicate so) Family Communication: no family in room (indicate person spoken with, if applicable, with phone number if by telephone) Disposition Plan: Admit to obs (indicate anticipated LOS)  Time spent: 50 min  GARDNER, JARED M. [facility] Pager [phone]  If 7PM-7AM, please contact night-coverage [email or web address] Password [facility] 03/22/2012, 10:25 PM

## 2012-03-22 NOTE — Progress Notes (Signed)
PT PLACED ON WHEEZE PROTOCOL FOR ADULTS.  ON FIRST PEAK FLOW PT BLEW 100/125//200. GAVE PT HIS FIRST TX OF ATROVENT/ALBUTEROL. AFTER TX PT STILL HAD AUDIBLE INSPIR AND EXPIR WHEEZES.  STARTED ON SECOND ALBUTEROL TX, PEAK FLOW 140/150/160.  SECOND ALBUTEROL TX STARTED, PEAK FLOW 180/190/220. THIRD TX STARTED, PEAK FKLOWS 200/190/200. PTS BS SOUND LESS AUDIBLE AND MORE OF EXPIR WHEEZES NOW.

## 2012-03-22 NOTE — ED Provider Notes (Signed)
History     CSN: [id number]  Arrival date & time 03/22/12  1408   First MD Initiated Contact with Patient 03/22/12 1829      Chief Complaint  Patient presents with  . Shortness of Breath    (Consider location/radiation/quality/duration/timing/severity/associated sxs/prior treatment) Patient is a 51 y.o. male presenting with general illness. The history is provided by the patient. No language interpreter was used.  Illness  The current episode started yesterday. The problem occurs continuously. The problem is severe. Nothing relieves the symptoms. Nothing aggravates the symptoms. Associated symptoms include a fever, cough and wheezing. Pertinent negatives include no abdominal pain, no constipation, no diarrhea, no nausea, no vomiting, no congestion, no headaches, no sore throat and no rash.    Past Medical History  Diagnosis Date  . Asthma   . DVT (deep venous thrombosis)   . Pulmonary embolism     History reviewed. No pertinent past surgical history.  History reviewed. No pertinent family history.  History  Substance Use Topics  . Smoking status: Current Some Day Smoker -- 0.2 packs/day  . Smokeless tobacco: Not on file  . Alcohol Use: Yes     Comment: occasionally      Review of Systems  Constitutional: Positive for fever. Negative for chills.  HENT: Negative for congestion and sore throat.   Respiratory: Positive for cough, shortness of breath and wheezing.   Cardiovascular: Negative for chest pain and leg swelling.  Gastrointestinal: Negative for nausea, vomiting, abdominal pain, diarrhea and constipation.  Genitourinary: Negative for dysuria and frequency.  Skin: Negative for color change and rash.  Neurological: Negative for dizziness and headaches.  Psychiatric/Behavioral: Negative for confusion and agitation.  All other systems reviewed and are negative.    Allergies  Review of patient's allergies indicates no known allergies.  Home Medications    Current Outpatient Rx  Name  Route  Sig  Dispense  Refill  . FLUTICASONE-SALMETEROL 250-50 MCG/DOSE IN AEPB   Inhalation   Inhale 1 puff into the lungs 2 (two) times daily.   60 each   1   . HYDROCORTISONE 1 % EX CREA   Topical   Apply topically 2 (two) times daily. Back itching   30 g   0   . IPRATROPIUM-ALBUTEROL 20-100 MCG/ACT IN AERS   Inhalation   Inhale 1 puff into the lungs every 6 (six) hours as needed for wheezing or shortness of breath.   1 Inhaler   1   . LORATADINE 10 MG PO TABS   Oral   Take 1 tablet (10 mg total) by mouth daily.   30 tablet   1   . PANTOPRAZOLE SODIUM 40 MG PO TBEC   Oral   Take 1 tablet (40 mg total) by mouth daily.   30 tablet   0     BP 126/88  Pulse 115  Temp 103.1 F (39.5 C) (Oral)  Resp 23  SpO2 99%  Physical Exam  Constitutional: He is oriented to person, place, and time. He appears well-developed and well-nourished. No distress.  HENT:  Head: Normocephalic and atraumatic.  Eyes: EOM are normal. Pupils are equal, round, and reactive to light.  Neck: Normal range of motion. Neck supple.  Cardiovascular: Regular rhythm.  Tachycardia present.   Pulmonary/Chest: Tachypnea noted. No respiratory distress. He has decreased breath sounds. He has wheezes. He has no rhonchi. He has no rales.  Abdominal: Soft. He exhibits no distension.  Musculoskeletal: Normal range of motion. He exhibits no edema.  Neurological: He is alert and oriented to person, place, and time.  Skin: Skin is warm and dry.  Psychiatric: He has a normal mood and affect. His behavior is normal.    ED Course  Procedures (including critical care time) Results for orders placed during the hospital encounter of 03/22/12  CBC WITH DIFFERENTIAL      Component Value Range   WBC 3.6 (*) 4.0 - 10.5 K/uL   RBC 4.46  4.22 - 5.81 MIL/uL   Hemoglobin 13.7  13.0 - 17.0 g/dL   HCT 16.1  09.6 - 04.5 %   MCV 90.4  78.0 - 100.0 fL   MCH 30.7  26.0 - 34.0 pg   MCHC  34.0  30.0 - 36.0 g/dL   RDW 40.9  81.1 - 91.4 %   Platelets PLATELET CLUMPS NOTED ON SMEAR, UNABLE TO ESTIMATE  150 - 400 K/uL   Neutrophils Relative 73  43 - 77 %   Lymphocytes Relative 13  12 - 46 %   Monocytes Relative 14 (*) 3 - 12 %   Eosinophils Relative 0  0 - 5 %   Basophils Relative 0  0 - 1 %   Neutro Abs 2.6  1.7 - 7.7 K/uL   Lymphs Abs 0.5 (*) 0.7 - 4.0 K/uL   Monocytes Absolute 0.5  0.1 - 1.0 K/uL   Eosinophils Absolute 0.0  0.0 - 0.7 K/uL   Basophils Absolute 0.0  0.0 - 0.1 K/uL   Smear Review FIBRIN STRANDS NOTED    BASIC METABOLIC PANEL      Component Value Range   Sodium 135  135 - 145 mEq/L   Potassium 3.8  3.5 - 5.1 mEq/L   Chloride 100  96 - 112 mEq/L   CO2 23  19 - 32 mEq/L   Glucose, Bld 109 (*) 70 - 99 mg/dL   BUN 8  6 - 23 mg/dL   Creatinine, Ser 7.82  0.50 - 1.35 mg/dL   Calcium 9.2  8.4 - 95.6 mg/dL   GFR calc non Af Amer >90  >90 mL/min   GFR calc Af Amer >90  >90 mL/min  CBC      Component Value Range   WBC 3.5 (*) 4.0 - 10.5 K/uL   RBC 4.09 (*) 4.22 - 5.81 MIL/uL   Hemoglobin 12.7 (*) 13.0 - 17.0 g/dL   HCT 21.3 (*) 08.6 - 57.8 %   MCV 90.5  78.0 - 100.0 fL   MCH 31.1  26.0 - 34.0 pg   MCHC 34.3  30.0 - 36.0 g/dL   RDW 46.9  62.9 - 52.8 %   Platelets 110 (*) 150 - 400 K/uL  CREATININE, SERUM      Component Value Range   Creatinine, Ser 0.65  0.50 - 1.35 mg/dL   GFR calc non Af Amer >90  >90 mL/min   GFR calc Af Amer >90  >90 mL/min   DG Chest 2 View (Final result)   Result time:03/22/12 1940    Final result by Rad Results In Interface (03/22/12 19:40:54)    Narrative:   *RADIOLOGY REPORT*  Clinical Data: Short of breath  CHEST - 2 VIEW  Comparison: 02/10/2012  Findings: Heart size is normal. No pleural effusion or edema. No airspace consolidation. Chronic bronchitic changes are noted bilaterally.  Visualized osseous structures are unremarkable.  IMPRESSION:  1. Chronic bronchitic changes. 2. No pneumonia.   Original  Report Authenticated By: Signa Kell, M.D.     Labs  Reviewed - No data to display No results found.   No diagnosis found.    MDM  Pt w/ PMHx of Asthma, DVT/PE now w/ 1 day hx of productive cough - white sputum, wheezing and fever. Has been using inhaler multiple times w/out relief. Denies recent travel or sick contacts. Denies chest pain. Received flu vaccine. On exam pt febrile to 102, tachycardic and tachypneic - not hypoxic, in moderate resp distress. Diffuse exp wheeze globally.  Concern for viral resp illness, asthma exacerbation. Doubt PE. Will give duoneb, tylenol and prednisone. Will check CXR, cbc, bmp.  Course: CXR - NACPF, labs unremarkable. Received duoneb and albuterol inhaler x 2 - total of 3 nebs w/out relief of sx. Given mag 2g and admitted to medicine. Given tamiflu.   1. Asthma exacerbation   2. Asthma with COPD with exacerbation           Audelia Hives, MD 03/23/12 [phone]

## 2012-03-23 DIAGNOSIS — R21 Rash and other nonspecific skin eruption: Secondary | ICD-10-CM

## 2012-03-23 LAB — INFLUENZA PANEL BY PCR (TYPE A & B)
H1N1 flu by pcr: DETECTED — AB
Influenza A By PCR: POSITIVE — AB
Influenza B By PCR: NEGATIVE

## 2012-03-23 MED ORDER — PNEUMOCOCCAL VAC POLYVALENT 25 MCG/0.5ML IJ INJ
0.5000 mL | INJECTION | INTRAMUSCULAR | Status: AC
  Administered 2012-03-24: 0.5 mL via INTRAMUSCULAR
  Filled 2012-03-23: qty 0.5

## 2012-03-23 MED ORDER — GUAIFENESIN-DM 100-10 MG/5ML PO SYRP
5.0000 mL | ORAL_SOLUTION | ORAL | Status: DC | PRN
  Administered 2012-03-23 – 2012-03-26 (×7): 5 mL via ORAL
  Filled 2012-03-23 (×7): qty 5

## 2012-03-23 NOTE — Progress Notes (Signed)
[facility] PROGRESS NOTE  [name] WUJ:[id number] DOB: August 27, 1960 DOA: 03/22/2012 PCP: No primary provider on file.  Assessment/Plan: 1. Asthma with COPD with exacerbation - Continue nebs, mometasone-formoterol, Solumedrol at 60 mg IV q 6 hours. At this juncture given his continued wheezes and cough will not plan on decreasing his solumedrol as patient is still having wheezes and cough.  With improvement next am will consider decreasing his current regimen. - Influenza panel pending, Continue tamiflu at this point.  If negative most likely cause of exacerbation likely due to coronaviridae - Chest x ray reported no pneumonia - No hemoptysis, tachycardia resolved in problem list patient has listed a personal history of PE.  Given wheezes and improvement on current regimen at this point index of suspicion more congruent with Asthma with COPD with exacerbation vs pe. - Patient initially placed on clear liquid diet.  Not sure why. He is requesting regular diet and given his improvement in condition will advance his diet today.  2. Rash on back - Patient reports having this rash for years.  Has appearance of rash associated with scabies but given that patient has had this for years not likely. - Would recommend dermatologist f/u once transitioned out of the hospital.     Code Status: full Family Communication: Spoke with patient at bedside, no family present Disposition Plan: Pending continued improvement in condition still with much wheeze and cough today 03/23/12   Consultants:  none  Procedures:  none  Antibiotics:  None  tamiflu started on admission  HPI/Subjective: Patient mentions that he feels better than when he first came in.  He is still having cough and wheeze.  No new complaints other than he would like his diet advanced.  Objective: Filed Vitals:   03/23/12 0003 03/23/12 0043 03/23/12 0419 03/23/12 0520  BP:  135/78 149/97   Pulse:  84 77   Temp: 99.2  F (37.3 C) 97.3 F (36.3 C) 97.4 F (36.3 C)   TempSrc: Oral Oral Oral   Resp:   20   Height:  6\' 2"  (1.88 m)    Weight:  79.425 kg (175 lb 1.6 oz)    SpO2:  100% 98% 96%    Intake/Output Summary (Last 24 hours) at 03/23/12 0955 Last data filed at 03/23/12 0005  Gross per 24 hour  Intake      0 ml  Output    600 ml  Net   -600 ml   Filed Weights   03/23/12 0043  Weight: 79.425 kg (175 lb 1.6 oz)    Exam:   General:  Pt in NAD, Alert and Oriented  Cardiovascular: RRR, No MRG  Respiratory: Coarse breath sounds diffusely, expiratory wheezes diffusely, speaking in full sentences  Abdomen: soft, NT, ND  Data Reviewed: Basic Metabolic Panel:  Lab 03/22/12 9562 03/22/12 1858  NA -- 135  K -- 3.8  CL -- 100  CO2 -- 23  GLUCOSE -- 109*  BUN -- 8  CREATININE 0.65 0.75  CALCIUM -- 9.2  MG -- --  PHOS -- --   Liver Function Tests: No results found for this basename: AST:5,ALT:5,ALKPHOS:5,BILITOT:5,PROT:5,ALBUMIN:5 in the last 168 hours No results found for this basename: LIPASE:5,AMYLASE:5 in the last 168 hours No results found for this basename: AMMONIA:5 in the last 168 hours CBC:  Lab 03/22/12 2229 03/22/12 1858  WBC 3.5* 3.6*  NEUTROABS -- 2.6  HGB 12.7* 13.7  HCT 37.0* 40.3  MCV 90.5 90.4  PLT 110* PLATELET CLUMPS NOTED ON  SMEAR, UNABLE TO ESTIMATE   Cardiac Enzymes: No results found for this basename: CKTOTAL:5,CKMB:5,CKMBINDEX:5,TROPONINI:5 in the last 168 hours BNP (last 3 results) No results found for this basename: PROBNP:3 in the last 8760 hours CBG: No results found for this basename: GLUCAP:5 in the last 168 hours  No results found for this or any previous visit (from the past 240 hour(s)).   Studies: Dg Chest 2 View  03/22/2012  *RADIOLOGY REPORT*  Clinical Data: Short of breath  CHEST - 2 VIEW  Comparison: 02/10/2012  Findings: Heart size is normal.  No pleural effusion or edema.  No airspace consolidation.  Chronic bronchitic changes are  noted bilaterally.  Visualized osseous structures are unremarkable.  IMPRESSION:  1.  Chronic bronchitic changes. 2.  No pneumonia.   Original Report Authenticated By: Signa Kell, M.D.     Scheduled Meds:   . ipratropium  0.5 mg Nebulization Q4H   And  . albuterol  2.5 mg Nebulization Q4H  . heparin  5,000 Units Subcutaneous Q8H  . loratadine  10 mg Oral Daily  . methylPREDNISolone (SOLU-MEDROL) injection  60 mg Intravenous Q6H  . mometasone-formoterol  2 puff Inhalation BID  . oseltamivir  75 mg Oral BID  . pantoprazole  40 mg Oral Daily  . pneumococcal 23 valent vaccine  0.5 mL Intramuscular Tomorrow-1000   Continuous Infusions:   Active Problems:  Asthma with COPD with exacerbation    Time spent: > 35 minutes    Penny Pia  [facility] Pager [phone] If 8PM-8AM, please contact night-coverage at [email or web address], password [facility] 03/23/2012, 9:55 AM  LOS: 1 day

## 2012-03-23 NOTE — Progress Notes (Signed)
Pt arrived to the floor via stretcher accompanied by ED staff. Transferred to the bed and admission assessment and history completed. Bed lowered to lowest position, wheels locked, and bed alarm turned on. Pt has no complaints of pain or shortness of breath. Will continue to assess pt periodically.   

## 2012-03-23 NOTE — ED Provider Notes (Signed)
I saw and evaluated the patient, reviewed the resident's note and I agree with the findings and plan.  CRITICAL CARE Performed by: Ethelda Chick   Total critical care time: 35  Critical care time was exclusive of separately billable procedures and treating other patients.  Critical care was necessary to treat or prevent imminent or life-threatening deterioration.  Critical care was time spent personally by me on the following activities: development of treatment plan with patient and/or surrogate as well as nursing, discussions with consultants, evaluation of patient's response to treatment, examination of patient, obtaining history from patient or surrogate, ordering and performing treatments and interventions, ordering and review of laboratory studies, ordering and review of radiographic studies, pulse oximetry and re-evaluation of patient's condition.  Pt presenting with diffuse expiratory wheezing despite multiple puffs of albuterol inhaler.  Also febrile.  No pneumonia on CXR.  Pt with continued wheezing after multiple nebs.  He has received steroids.  Will also give magnesium.  Check influenza and start tamiflu.  Pt to be admitted to medicine service for further management  Ethelda Chick, MD 03/23/12 1531

## 2012-03-24 DIAGNOSIS — J101 Influenza due to other identified influenza virus with other respiratory manifestations: Secondary | ICD-10-CM | POA: Diagnosis present

## 2012-03-24 DIAGNOSIS — L209 Atopic dermatitis, unspecified: Secondary | ICD-10-CM | POA: Diagnosis present

## 2012-03-24 DIAGNOSIS — L2089 Other atopic dermatitis: Secondary | ICD-10-CM

## 2012-03-24 DIAGNOSIS — Z9119 Patient's noncompliance with other medical treatment and regimen: Secondary | ICD-10-CM

## 2012-03-24 DIAGNOSIS — Z91199 Patient's noncompliance with other medical treatment and regimen due to unspecified reason: Secondary | ICD-10-CM

## 2012-03-24 DIAGNOSIS — J111 Influenza due to unidentified influenza virus with other respiratory manifestations: Secondary | ICD-10-CM

## 2012-03-24 LAB — CBC
HCT: 35.4 % — ABNORMAL LOW (ref 39.0–52.0)
Hemoglobin: 12.2 g/dL — ABNORMAL LOW (ref 13.0–17.0)
MCH: 31.4 pg (ref 26.0–34.0)
MCHC: 34.5 g/dL (ref 30.0–36.0)

## 2012-03-24 MED ORDER — CAMPHOR-MENTHOL 0.5-0.5 % EX LOTN: TOPICAL_LOTION | CUTANEOUS | Status: DC | PRN

## 2012-03-24 MED ORDER — IPRATROPIUM BROMIDE 0.02 % IN SOLN
0.5000 mg | Freq: Four times a day (QID) | RESPIRATORY_TRACT | Status: DC
  Administered 2012-03-24 – 2012-03-25 (×5): 0.5 mg via RESPIRATORY_TRACT
  Filled 2012-03-24 (×6): qty 2.5

## 2012-03-24 MED ORDER — ALBUTEROL SULFATE (5 MG/ML) 0.5% IN NEBU
2.5000 mg | INHALATION_SOLUTION | Freq: Four times a day (QID) | RESPIRATORY_TRACT | Status: DC
  Administered 2012-03-24 – 2012-03-25 (×5): 2.5 mg via RESPIRATORY_TRACT
  Filled 2012-03-24 (×6): qty 0.5

## 2012-03-24 NOTE — Progress Notes (Signed)
Utilization review completed.  

## 2012-03-24 NOTE — Progress Notes (Signed)
[facility] PROGRESS NOTE  [name] ZOX:[id number] DOB: 1960-09-17 DOA: 03/22/2012 PCP: No primary provider on file.  Assessment/Plan: COPD exacerbation -Continue intravenous steroids -Patient continues to have mild wheezing with cough- -expect slow recovery do to concomitant influenza -Continue albuterol and Atrovent nebulizer and Dulera Acute influenza -continue oseltamivir -Expect prolonged viral shedding do to concomitant steroids   tobacco abuse -Tobacco cessation discussed Atopic dermatitis -Sarna when necessary Leukopenia/thrombocytopenia -Appears to be chronic -Continue to monitor     Family Communication:   Pt at beside Disposition Plan:   Home when medically stable    Antibiotics:  Oseltamivir 03/22/2012>>>    Procedures/Studies: Dg Chest 2 View  03/22/2012  *RADIOLOGY REPORT*  Clinical Data: Short of breath  CHEST - 2 VIEW  Comparison: 02/10/2012  Findings: Heart size is normal.  No pleural effusion or edema.  No airspace consolidation.  Chronic bronchitic changes are noted bilaterally.  Visualized osseous structures are unremarkable.  IMPRESSION:  1.  Chronic bronchitic changes. 2.  No pneumonia.   Original Report Authenticated By: Signa Kell, M.D.          Subjective:  patient states that he is breathing 50% better, but continues to have dyspnea on exertion. Denies fevers, chills, chest pain, nausea, vomiting, diarrhea, abdominal pain, dysuria, hematuria.  Objective: Filed Vitals:   03/23/12 2144 03/24/12 0506 03/24/12 0912 03/24/12 1253  BP: 144/82 139/88 154/98 145/86  Pulse: 90 74 68 81  Temp: 98.1 F (36.7 C) 98.2 F (36.8 C) 97.8 F (36.6 C) 97.6 F (36.4 C)  TempSrc: Oral Oral Oral Oral  Resp: 18 18 18 18   Height: 6\' 2"  (1.88 m)     Weight: 81.149 kg (178 lb 14.4 oz)     SpO2: 97% 96% 97% 95%    Intake/Output Summary (Last 24 hours) at 03/24/12 1628 Last data filed at 03/24/12 0506  Gross per 24 hour  Intake     118 ml  Output      0 ml  Net    118 ml   Weight change: 1.724 kg (3 lb 12.8 oz) Exam:   General:  Pt is alert, follows commands appropriately, not in acute distress  HEENT: No icterus, No thrush, [place]/AT  Cardiovascular: RRR, S1/S2, no rubs, no gallops  Respiratory: bilateral scattered wheezing. Left basal crackles. No wheezes or rhonchi. Good air movement.   Abdomen: Soft/+BS, non tender, non distended, no guarding  Extremities: trace edema, No lymphangitis, No petechiae, No rashes, no synovitis  Data Reviewed: Basic Metabolic Panel:  Lab 03/22/12 8119 03/22/12 1858  NA -- 135  K -- 3.8  CL -- 100  CO2 -- 23  GLUCOSE -- 109*  BUN -- 8  CREATININE 0.65 0.75  CALCIUM -- 9.2  MG -- --  PHOS -- --   Liver Function Tests: No results found for this basename: AST:5,ALT:5,ALKPHOS:5,BILITOT:5,PROT:5,ALBUMIN:5 in the last 168 hours No results found for this basename: LIPASE:5,AMYLASE:5 in the last 168 hours No results found for this basename: AMMONIA:5 in the last 168 hours CBC:  Lab 03/24/12 1047 03/22/12 2229 03/22/12 1858  WBC 3.1* 3.5* 3.6*  NEUTROABS -- -- 2.6  HGB 12.2* 12.7* 13.7  HCT 35.4* 37.0* 40.3  MCV 91.0 90.5 90.4  PLT 113* 110* PLATELET CLUMPS NOTED ON SMEAR, UNABLE TO ESTIMATE   Cardiac Enzymes: No results found for this basename: CKTOTAL:5,CKMB:5,CKMBINDEX:5,TROPONINI:5 in the last 168 hours BNP: No components found with this basename: POCBNP:5 CBG: No results found for this basename: GLUCAP:5 in the last 168  hours  No results found for this or any previous visit (from the past 240 hour(s)).   Scheduled Meds:   . ipratropium  0.5 mg Nebulization Q6H   And  . albuterol  2.5 mg Nebulization Q6H  . heparin  5,000 Units Subcutaneous Q8H  . loratadine  10 mg Oral Daily  . methylPREDNISolone (SOLU-MEDROL) injection  60 mg Intravenous Q6H  . mometasone-formoterol  2 puff Inhalation BID  . oseltamivir  75 mg Oral BID  . pantoprazole  40 mg Oral Daily    Continuous Infusions:    Erlinda Solinger, DO  [facility] Pager [phone]  If 7PM-7AM, please contact night-coverage [email or web address] Password [email or web address] 03/24/2012, 4:28 PM   LOS: 2 days

## 2012-03-25 DIAGNOSIS — D72819 Decreased white blood cell count, unspecified: Secondary | ICD-10-CM

## 2012-03-25 LAB — CBC
Hemoglobin: 12.5 g/dL — ABNORMAL LOW (ref 13.0–17.0)
MCH: 30.9 pg (ref 26.0–34.0)
MCHC: 33.9 g/dL (ref 30.0–36.0)
MCV: 91.1 fL (ref 78.0–100.0)
Platelets: 130 10*3/uL — ABNORMAL LOW (ref 150–400)

## 2012-03-25 LAB — PHOSPHORUS: Phosphorus: 2.2 mg/dL — ABNORMAL LOW (ref 2.3–4.6)

## 2012-03-25 LAB — BASIC METABOLIC PANEL
BUN: 15 mg/dL (ref 6–23)
Calcium: 9.1 mg/dL (ref 8.4–10.5)
GFR calc non Af Amer: >90 mL/min (ref >90)
Glucose, Bld: 175 mg/dL — ABNORMAL HIGH (ref 70–99)

## 2012-03-25 LAB — TROPONIN I: Troponin I: <0.3 ng/mL (ref <0.30)

## 2012-03-25 MED ORDER — METHYLPREDNISOLONE SODIUM SUCC 125 MG IJ SOLR
125.0000 mg | Freq: Four times a day (QID) | INTRAMUSCULAR | Status: DC
  Administered 2012-03-26 – 2012-03-28 (×10): 125 mg via INTRAVENOUS
  Filled 2012-03-25 (×14): qty 2

## 2012-03-25 MED ORDER — ALBUTEROL SULFATE (5 MG/ML) 0.5% IN NEBU
2.5000 mg | INHALATION_SOLUTION | Freq: Four times a day (QID) | RESPIRATORY_TRACT | Status: DC
  Administered 2012-03-26 – 2012-03-29 (×15): 2.5 mg via RESPIRATORY_TRACT
  Filled 2012-03-25 (×13): qty 0.5

## 2012-03-25 MED ORDER — IPRATROPIUM BROMIDE 0.02 % IN SOLN
0.5000 mg | RESPIRATORY_TRACT | Status: DC
  Administered 2012-03-25: 0.5 mg via RESPIRATORY_TRACT
  Filled 2012-03-25: qty 2.5

## 2012-03-25 MED ORDER — ALBUTEROL SULFATE (5 MG/ML) 0.5% IN NEBU
2.5000 mg | INHALATION_SOLUTION | RESPIRATORY_TRACT | Status: DC
  Administered 2012-03-25: 2.5 mg via RESPIRATORY_TRACT
  Filled 2012-03-25: qty 0.5

## 2012-03-25 MED ORDER — IPRATROPIUM BROMIDE 0.02 % IN SOLN
0.5000 mg | Freq: Four times a day (QID) | RESPIRATORY_TRACT | Status: DC
  Administered 2012-03-26 – 2012-03-29 (×15): 0.5 mg via RESPIRATORY_TRACT
  Filled 2012-03-25 (×13): qty 2.5

## 2012-03-25 MED ORDER — K PHOS MONO-SOD PHOS DI & MONO 155-852-130 MG PO TABS
500.0000 mg | ORAL_TABLET | Freq: Two times a day (BID) | ORAL | Status: AC
  Administered 2012-03-25 – 2012-03-27 (×4): 500 mg via ORAL
  Filled 2012-03-25 (×4): qty 2

## 2012-03-25 NOTE — Progress Notes (Signed)
[facility] PROGRESS NOTE  Mayes Sangiovanni ZOX:[id number] DOB: 1960-12-19 DOA: 03/22/2012 PCP: No primary provider on file.  Assessment/Plan: COPD exacerbation  --Very slow improvement -Continue intravenous steroids, increased this to 125 mg every 6 hours -Patient continues to have mild wheezing with cough-  -expect slow recovery do to concomitant influenza  -Continue albuterol and Atrovent nebulizer and Dulera  -Increase nebulizers to every 4 hours Acute influenza  -continue oseltamivir  -Expect prolonged viral shedding do to concomitant steroids  tobacco abuse  -Tobacco cessation discussed  Atopic dermatitis  -Sarna when necessary  Leukopenia/thrombocytopenia  -Appears to be chronic  -Continue to monitor Hypophosphatemia -Replete Chest discomfort -Cycle troponins, check EKG     Disposition Plan:   Home when medically stable      Procedures/Studies: Dg Chest 2 View  03/22/2012  *RADIOLOGY REPORT*  Clinical Data: Short of breath  CHEST - 2 VIEW  Comparison: 02/10/2012  Findings: Heart size is normal.  No pleural effusion or edema.  No airspace consolidation.  Chronic bronchitic changes are noted bilaterally.  Visualized osseous structures are unremarkable.  IMPRESSION:  1.  Chronic bronchitic changes. 2.  No pneumonia.   Original Report Authenticated By: Signa Kell, M.D.          Subjective: Patient feels only subtly better than yesterday. Denies any dizziness, nausea, vomiting, diarrhea, abdominal pain. Has intermittent chest discomfort after meals. Denies fevers, chills, dysuria, hematuria   Patient  Objective: Filed Vitals:   03/24/12 2309 03/25/12 0428 03/25/12 1009 03/25/12 1523  BP: 147/89 151/99 163/97 159/85  Pulse: 73 69 71 71  Temp: 97.8 F (36.6 C) 97.9 F (36.6 C) 98 F (36.7 C) 97.9 F (36.6 C)  TempSrc: Oral Oral    Resp: 20 20 20 20   Height: 6\' 2"  (1.88 m)     Weight: 83.008 kg (183 lb)     SpO2: 93% 95% 96% 98%     Intake/Output Summary (Last 24 hours) at 03/25/12 1910 Last data filed at 03/25/12 1500  Gross per 24 hour  Intake    480 ml  Output      0 ml  Net    480 ml   Weight change: 1.86 kg (4 lb 1.6 oz) Exam:   General:  Pt is alert, follows commands appropriately, not in acute distress  HEENT: No icterus, No thrush, [place]/AT  Cardiovascular: RRR, S1/S2, no rubs, no gallops  Respiratory: Bilateral expiratory wheeze. No rhonchi. Good air movement.   Abdomen: Soft/+BS, non tender, non distended, no guarding  Extremities: No edema, No lymphangitis, No petechiae, No rashes, no synovitis  Data Reviewed: Basic Metabolic Panel:  Lab 03/25/12 8119 03/22/12 2229 03/22/12 1858  NA 141 -- 135  K 3.7 -- 3.8  CL 107 -- 100  CO2 20 -- 23  GLUCOSE 175* -- 109*  BUN 15 -- 8  CREATININE 0.60 0.65 0.75  CALCIUM 9.1 -- 9.2  MG -- -- --  PHOS 2.2* -- --   Liver Function Tests: No results found for this basename: AST:5,ALT:5,ALKPHOS:5,BILITOT:5,PROT:5,ALBUMIN:5 in the last 168 hours No results found for this basename: LIPASE:5,AMYLASE:5 in the last 168 hours No results found for this basename: AMMONIA:5 in the last 168 hours CBC:  Lab 03/25/12 0805 03/24/12 1047 03/22/12 2229 03/22/12 1858  WBC 2.9* 3.1* 3.5* 3.6*  NEUTROABS -- -- -- 2.6  HGB 12.5* 12.2* 12.7* 13.7  HCT 36.9* 35.4* 37.0* 40.3  MCV 91.1 91.0 90.5 90.4  PLT 130* 113* 110* PLATELET CLUMPS NOTED ON SMEAR, UNABLE  TO ESTIMATE   Cardiac Enzymes: No results found for this basename: CKTOTAL:5,CKMB:5,CKMBINDEX:5,TROPONINI:5 in the last 168 hours BNP: No components found with this basename: POCBNP:5 CBG: No results found for this basename: GLUCAP:5 in the last 168 hours  No results found for this or any previous visit (from the past 240 hour(s)).   Scheduled Meds:   . ipratropium  0.5 mg Nebulization Q4H   And  . albuterol  2.5 mg Nebulization Q4H  . heparin  5,000 Units Subcutaneous Q8H  . loratadine  10 mg Oral  Daily  . methylPREDNISolone (SOLU-MEDROL) injection  125 mg Intravenous Q6H  . mometasone-formoterol  2 puff Inhalation BID  . oseltamivir  75 mg Oral BID  . pantoprazole  40 mg Oral Daily  . phosphorus  500 mg Oral BID   Continuous Infusions:    Aundrea Higginbotham, DO  [facility] Pager [phone]  If 7PM-7AM, please contact night-coverage [email or web address] Password [email or web address] 03/25/2012, 7:10 PM   LOS: 3 days

## 2012-03-26 LAB — TROPONIN I: Troponin I: <0.3 ng/mL (ref <0.30)

## 2012-03-26 LAB — CBC
MCHC: 34.4 g/dL (ref 30.0–36.0)
MCV: 90.7 fL (ref 78.0–100.0)
Platelets: 111 10*3/uL — ABNORMAL LOW (ref 150–400)
RDW: 13 % (ref 11.5–15.5)
WBC: 2.7 10*3/uL — ABNORMAL LOW (ref 4.0–10.5)

## 2012-03-26 LAB — BASIC METABOLIC PANEL
BUN: 13 mg/dL (ref 6–23)
CO2: 25 mEq/L (ref 19–32)
Calcium: 8.8 mg/dL (ref 8.4–10.5)
Creatinine, Ser: 0.62 mg/dL (ref 0.50–1.35)

## 2012-03-26 NOTE — Progress Notes (Signed)
03/26/2012 patient c/o pain in mid chest at 1300,  which pain was a 7. Patient was given tylenol at 1307.Patient patient pain was reassess and it was a 4. MD was notified and orders were given for 12 Lead ekg and labs. Patient vitals at 1436 was 152/84, 97.3, 88, 18, 94%RA. Continue to monitor patient . Keisean Skowron Rn.

## 2012-03-26 NOTE — Progress Notes (Signed)
[facility] PROGRESS NOTE  [name] ZOX:[id number] DOB: May 12, 1960 DOA: 03/22/2012 PCP: No primary provider on file.  Assessment/Plan: COPD exacerbation  --Very slow improvement  -Continue intravenous steroids, increased this to 125 mg every 6 hours  -still wheezing but moving better air with increased steroid -Patient continues to have mild wheezing with cough-  -expect slow recovery do to concomitant influenza  -Continue albuterol and Atrovent nebulizer and Dulera  -Increased nebulizers to every 4 hours  Acute influenza  -continue oseltamivir  -Expect prolonged viral shedding do to concomitant steroids  tobacco abuse  -Tobacco cessation discussed  Atopic dermatitis  -Sarna when necessary  Leukopenia/thrombocytopenia  -Appears to be chronic  -Continue to monitor  Hypophosphatemia  -Replete  Chest discomfort  -Cycle troponins, check EKG--negative for ACS           Procedures/Studies: Dg Chest 2 View  03/22/2012  *RADIOLOGY REPORT*  Clinical Data: Short of breath  CHEST - 2 VIEW  Comparison: 02/10/2012  Findings: Heart size is normal.  No pleural effusion or edema.  No airspace consolidation.  Chronic bronchitic changes are noted bilaterally.  Visualized osseous structures are unremarkable.  IMPRESSION:  1.  Chronic bronchitic changes. 2.  No pneumonia.   Original Report Authenticated By: Signa Kell, M.D.          Subjective: Patient is breathing a little better than yesterday but continues to have dyspnea on exertion. Denies fevers, chills, chest pain, nausea, vomiting, diarrhea, abdominal pain, dysuria, hematuria. He does have some epigastric pain about 10 minutes after he eats. No odynophagia, no dysphagia  Objective: Filed Vitals:   03/26/12 0935 03/26/12 1306 03/26/12 1436 03/26/12 1648  BP: 142/81 143/84 152/84 165/89  Pulse: 72 87 88 73  Temp: 97.2 F (36.2 C) 97.9 F (36.6 C) 97.3 F (36.3 C) 97.8 F (36.6 C)  TempSrc:   Oral     Resp: 19 18 18 18   Height:      Weight:      SpO2: 98% 97% 94% 97%    Intake/Output Summary (Last 24 hours) at 03/26/12 1804 Last data filed at 03/26/12 1648  Gross per 24 hour  Intake    360 ml  Output    575 ml  Net   -215 ml   Weight change: 0.192 kg (6.8 oz) Exam:   General:  Pt is alert, follows commands appropriately, not in acute distress  HEENT: No icterus, No thrush, /AT  Cardiovascular: RRR, S1/S2, no rubs, no gallops  Respiratory: Bilateral expiratory wheeze. No rhonchi. Good air movement.  Abdomen: Soft/+BS, non tender, non distended, no guarding  Extremities: No edema, No lymphangitis, No petechiae, No rashes, no synovitis  Data Reviewed: Basic Metabolic Panel:  Lab 03/26/12 8119 03/25/12 0805 03/22/12 2229 03/22/12 1858  NA 139 141 -- 135  K 3.5 3.7 -- 3.8  CL 106 107 -- 100  CO2 25 20 -- 23  GLUCOSE 165* 175* -- 109*  BUN 13 15 -- 8  CREATININE 0.62 0.60 0.65 0.75  CALCIUM 8.8 9.1 -- 9.2  MG -- -- -- --  PHOS 2.2* 2.2* -- --   Liver Function Tests: No results found for this basename: AST:5,ALT:5,ALKPHOS:5,BILITOT:5,PROT:5,ALBUMIN:5 in the last 168 hours No results found for this basename: LIPASE:5,AMYLASE:5 in the last 168 hours No results found for this basename: AMMONIA:5 in the last 168 hours CBC:  Lab 03/26/12 0120 03/25/12 0805 03/24/12 1047 03/22/12 2229 03/22/12 1858  WBC 2.7* 2.9* 3.1* 3.5* 3.6*  NEUTROABS -- -- -- --  2.6  HGB 11.7* 12.5* 12.2* 12.7* 13.7  HCT 34.0* 36.9* 35.4* 37.0* 40.3  MCV 90.7 91.1 91.0 90.5 90.4  PLT 111* 130* 113* 110* PLATELET CLUMPS NOTED ON SMEAR, UNABLE TO ESTIMATE   Cardiac Enzymes:  Lab 03/26/12 1519 03/26/12 0120 03/25/12 2014  CKTOTAL -- -- --  CKMB -- -- --  CKMBINDEX -- -- --  TROPONINI <0.30 <0.30 <0.30   BNP: No components found with this basename: POCBNP:5 CBG: No results found for this basename: GLUCAP:5 in the last 168 hours  No results found for this or any previous visit (from  the past 240 hour(s)).   Scheduled Meds:   . ipratropium  0.5 mg Nebulization Q6H   And  . albuterol  2.5 mg Nebulization Q6H  . heparin  5,000 Units Subcutaneous Q8H  . loratadine  10 mg Oral Daily  . methylPREDNISolone (SOLU-MEDROL) injection  125 mg Intravenous Q6H  . mometasone-formoterol  2 puff Inhalation BID  . oseltamivir  75 mg Oral BID  . pantoprazole  40 mg Oral Daily  . phosphorus  500 mg Oral BID   Continuous Infusions:    Anisa Leanos, DO  [facility] Pager [phone]  If 7PM-7AM, please contact night-coverage [email or web address] Password [email or web address] 03/26/2012, 6:04 PM   LOS: 4 days

## 2012-03-27 LAB — BASIC METABOLIC PANEL
BUN: 16 mg/dL (ref 6–23)
CO2: 28 mEq/L (ref 19–32)
Chloride: 106 mEq/L (ref 96–112)
Creatinine, Ser: 0.52 mg/dL (ref 0.50–1.35)
Glucose, Bld: 179 mg/dL — ABNORMAL HIGH (ref 70–99)

## 2012-03-27 LAB — PHOSPHORUS: Phosphorus: 3 mg/dL (ref 2.3–4.6)

## 2012-03-27 MED ORDER — HYDRALAZINE HCL 20 MG/ML IJ SOLN
5.0000 mg | Freq: Once | INTRAMUSCULAR | Status: AC
  Administered 2012-03-27: 5 mg via INTRAVENOUS
  Filled 2012-03-27: qty 0.25

## 2012-03-27 MED ORDER — HYDROCODONE-ACETAMINOPHEN 5-325 MG PO TABS
1.0000 | ORAL_TABLET | ORAL | Status: DC | PRN
  Administered 2012-03-27 – 2012-03-28 (×5): 1 via ORAL
  Filled 2012-03-27 (×5): qty 1

## 2012-03-27 NOTE — Progress Notes (Signed)
[facility] PROGRESS NOTE  [name] ZOX:[id number] DOB: 03/30/1960 DOA: 03/22/2012 PCP: No primary provider on file.  Assessment/Plan: COPD exacerbation  --Very slow improvement--minimal wheeze today for first time  -Continue intravenous steroids, increased this to 125 mg every 6 hours  -still wheezing but moving better air with increased steroid  -Patient continues to have mild wheezing with cough-  -expect slow recovery do to concomitant influenza  -Continue albuterol and Atrovent nebulizer and Dulera  -Increased nebulizers to every 4 hours  -cut IV steroids by 1/2 today Acute influenza  -continue oseltamivir--finished Tamiflu yesterday -Expect prolonged viral shedding do to concomitant steroids  tobacco abuse  -Tobacco cessation discussed  Atopic dermatitis  -Sarna when necessary  Leukopenia/thrombocytopenia  -Appears to be chronic  -Continue to monitor  Hypophosphatemia  -Repleted Chest discomfort  -Cycle troponins, check EKG--negative for ACS      Family Communication:   Pt at beside Disposition Plan:   Home when medically stable     Procedures/Studies: Dg Chest 2 View  03/22/2012  *RADIOLOGY REPORT*  Clinical Data: Short of breath  CHEST - 2 VIEW  Comparison: 02/10/2012  Findings: Heart size is normal.  No pleural effusion or edema.  No airspace consolidation.  Chronic bronchitic changes are noted bilaterally.  Visualized osseous structures are unremarkable.  IMPRESSION:  1.  Chronic bronchitic changes. 2.  No pneumonia.   Original Report Authenticated By: Signa Kell, M.D.          Subjective: [name] much better today. Much less wheezing. Denies any chest pain, shortness breath, nausea, vomiting, diarrhea, abdominal pain. Still has some dyspnea on exertion. No rashes. No dysuria. Denies fevers or chills.  Objective: Filed Vitals:   03/27/12 0436 03/27/12 0947 03/27/12 1052 03/27/12 1329  BP: 164/87  155/92 161/92  Pulse: 63  77  68  Temp: 98.1 F (36.7 C)  98.1 F (36.7 C) 98.2 F (36.8 C)  TempSrc: Oral  Oral Oral  Resp: 16  16 16   Height:      Weight:      SpO2: 94% 98% 98% 98%    Intake/Output Summary (Last 24 hours) at 03/27/12 1902 Last data filed at 03/27/12 0437  Gross per 24 hour  Intake      0 ml  Output    450 ml  Net   -450 ml   Weight change:  Exam:   General:  Pt is alert, follows commands appropriately, not in acute distress  HEENT: No icterus, No thrush,  [place]/AT  Cardiovascular: RRR, S1/S2, no rubs, no gallops  Respiratory: Minimal basilar wheezing. Good air movement.  Abdomen: Soft/+BS, non tender, non distended, no guarding  Extremities: trace edema, No lymphangitis, No petechiae, No rashes, no synovitis  Data Reviewed: Basic Metabolic Panel:  Lab 03/27/12 8119 03/26/12 0120 03/25/12 0805 03/22/12 2229 03/22/12 1858  NA 142 139 141 -- 135  K 3.4* 3.5 3.7 -- 3.8  CL 106 106 107 -- 100  CO2 28 25 20  -- 23  GLUCOSE 179* 165* 175* -- 109*  BUN 16 13 15  -- 8  CREATININE 0.52 0.62 0.60 0.65 0.75  CALCIUM 8.6 8.8 9.1 -- 9.2  MG -- -- -- -- --  PHOS 3.0 2.2* 2.2* -- --   Liver Function Tests: No results found for this basename: AST:5,ALT:5,ALKPHOS:5,BILITOT:5,PROT:5,ALBUMIN:5 in the last 168 hours No results found for this basename: LIPASE:5,AMYLASE:5 in the last 168 hours No results found for this basename: AMMONIA:5 in the last 168 hours CBC:  Lab 03/26/12  0120 03/25/12 0805 03/24/12 1047 03/22/12 2229 03/22/12 1858  WBC 2.7* 2.9* 3.1* 3.5* 3.6*  NEUTROABS -- -- -- -- 2.6  HGB 11.7* 12.5* 12.2* 12.7* 13.7  HCT 34.0* 36.9* 35.4* 37.0* 40.3  MCV 90.7 91.1 91.0 90.5 90.4  PLT 111* 130* 113* 110* PLATELET CLUMPS NOTED ON SMEAR, UNABLE TO ESTIMATE   Cardiac Enzymes:  Lab 03/26/12 2042 03/26/12 1519 03/26/12 0120 03/25/12 2014  CKTOTAL -- -- -- --  CKMB -- -- -- --  CKMBINDEX -- -- -- --  TROPONINI <0.30 <0.30 <0.30 <0.30   BNP: No components found with this  basename: POCBNP:5 CBG: No results found for this basename: GLUCAP:5 in the last 168 hours  No results found for this or any previous visit (from the past 240 hour(s)).   Scheduled Meds:   . ipratropium  0.5 mg Nebulization Q6H   And  . albuterol  2.5 mg Nebulization Q6H  . heparin  5,000 Units Subcutaneous Q8H  . loratadine  10 mg Oral Daily  . methylPREDNISolone (SOLU-MEDROL) injection  125 mg Intravenous Q6H  . mometasone-formoterol  2 puff Inhalation BID  . pantoprazole  40 mg Oral Daily   Continuous Infusions:    Clorissa Gruenberg, DO  [facility] Pager [phone]  If 7PM-7AM, please contact night-coverage [email or web address] Password [email or web address] 03/27/2012, 7:02 PM   LOS: 5 days

## 2012-03-28 DIAGNOSIS — Z86711 Personal history of pulmonary embolism: Secondary | ICD-10-CM

## 2012-03-28 LAB — URINALYSIS, ROUTINE W REFLEX MICROSCOPIC
Bilirubin Urine: NEGATIVE
Glucose, UA: >1000 mg/dL — AB
Nitrite: NEGATIVE
Specific Gravity, Urine: 1.035 — ABNORMAL HIGH (ref 1.005–1.030)
pH: 6 (ref 5.0–8.0)

## 2012-03-28 LAB — GLUCOSE, CAPILLARY: Glucose-Capillary: 151 mg/dL — ABNORMAL HIGH (ref 70–99)

## 2012-03-28 LAB — URINE MICROSCOPIC-ADD ON

## 2012-03-28 MED ORDER — METHYLPREDNISOLONE SODIUM SUCC 125 MG IJ SOLR
60.0000 mg | Freq: Four times a day (QID) | INTRAMUSCULAR | Status: AC
  Administered 2012-03-28 (×2): 60 mg via INTRAVENOUS
  Filled 2012-03-28 (×2): qty 0.96

## 2012-03-28 MED ORDER — PREDNISONE 20 MG PO TABS
40.0000 mg | ORAL_TABLET | Freq: Every day | ORAL | Status: DC
  Administered 2012-03-29 – 2012-03-30 (×2): 40 mg via ORAL
  Filled 2012-03-28 (×3): qty 2

## 2012-03-28 MED ORDER — LEVOFLOXACIN 750 MG PO TABS
750.0000 mg | ORAL_TABLET | Freq: Every day | ORAL | Status: AC
  Administered 2012-03-28 – 2012-04-01 (×5): 750 mg via ORAL
  Filled 2012-03-28 (×5): qty 1

## 2012-03-28 NOTE — Progress Notes (Signed)
[facility] PROGRESS NOTE  [name] WUJ:[id number] DOB: Dec 30, 1960 DOA: 03/22/2012 PCP: No primary provider on file.  Assessment/Plan: COPD exacerbation  Very slow improvement, still having wheezing. Transition IV steroids to oral prednisone 40 mg today from IV solumedrol 125 mg q6h. Expect slow recovery do to concomitant influenza. Completed course of antiviral therapy. Continue albuterol and Atrovent nebulizer and Dulera. Continue nebulizers to every 4 hours. Start 5 day course of antibiotics, which may help with COPD exacerbation.  Acute influenza  Continue oseltamivir--finished Tamiflu on 03/27/2012. Expect prolonged viral shedding do to concomitant steroids.  Tobacco abuse  Tobacco cessation discussed again with the patient today.  Atopic dermatitis  Sarna when necessary   Leukopenia/thrombocytopenia  Appears to be chronic. Stable.   Hypophosphatemia  Repleted  Chest discomfort  Cycle troponins, check EKG--negative for ACS. Likely due to COPD exacerbation.  Family Communication:   Pt at beside Disposition Plan:   Home when medically stable. DC in 1-2 days depending on respiratory status.  Procedures/Studies: Dg Chest 2 View 03/22/2012  *RADIOLOGY REPORT*  Clinical Data: Short of breath  CHEST - 2 VIEW  Comparison: 02/10/2012  Findings: Heart size is normal.  No pleural effusion or edema.  No airspace consolidation.  Chronic bronchitic changes are noted bilaterally.  Visualized osseous structures are unremarkable.  IMPRESSION:  1.  Chronic bronchitic changes. 2.  No pneumonia.   Original Report Authenticated By: Signa Kell, M.D.      Subjective: Breathing better tdoay. No other specific concerns.   Objective: Filed Vitals:   03/28/12 0240 03/28/12 0529 03/28/12 0820 03/28/12 0944  BP:  160/89 174/90   Pulse:  66 62   Temp:  98.6 F (37 C) 98.1 F (36.7 C)   TempSrc:  Oral Oral   Resp:  18 18   Height:      Weight:      SpO2: 97% 97% 98% 95%     Intake/Output Summary (Last 24 hours) at 03/28/12 0959 Last data filed at 03/28/12 0700  Gross per 24 hour  Intake    960 ml  Output    425 ml  Net    535 ml   Weight change:  Exam: Physical Exam: General: Awake, Oriented, No acute distress. HEENT: EOMI. Neck: Supple CV: S1 and S2 Lungs: Expiratory wheezing, good air movement and respiratory effort. Abdomen: Soft, Nontender, Nondistended, +bowel sounds. Ext: Good pulses. Trace edema.  Data Reviewed: Basic Metabolic Panel:  Lab 03/27/12 9562 03/26/12 0120 03/25/12 0805 03/22/12 2229 03/22/12 1858  NA 142 139 141 -- 135  K 3.4* 3.5 3.7 -- 3.8  CL 106 106 107 -- 100  CO2 28 25 20  -- 23  GLUCOSE 179* 165* 175* -- 109*  BUN 16 13 15  -- 8  CREATININE 0.52 0.62 0.60 0.65 0.75  CALCIUM 8.6 8.8 9.1 -- 9.2  MG -- -- -- -- --  PHOS 3.0 2.2* 2.2* -- --   Liver Function Tests: No results found for this basename: AST:5,ALT:5,ALKPHOS:5,BILITOT:5,PROT:5,ALBUMIN:5 in the last 168 hours No results found for this basename: LIPASE:5,AMYLASE:5 in the last 168 hours No results found for this basename: AMMONIA:5 in the last 168 hours CBC:  Lab 03/26/12 0120 03/25/12 0805 03/24/12 1047 03/22/12 2229 03/22/12 1858  WBC 2.7* 2.9* 3.1* 3.5* 3.6*  NEUTROABS -- -- -- -- 2.6  HGB 11.7* 12.5* 12.2* 12.7* 13.7  HCT 34.0* 36.9* 35.4* 37.0* 40.3  MCV 90.7 91.1 91.0 90.5 90.4  PLT 111* 130* 113* 110* PLATELET CLUMPS NOTED ON SMEAR,  UNABLE TO ESTIMATE   Cardiac Enzymes:  Lab 03/26/12 2042 03/26/12 1519 03/26/12 0120 03/25/12 2014  CKTOTAL -- -- -- --  CKMB -- -- -- --  CKMBINDEX -- -- -- --  TROPONINI <0.30 <0.30 <0.30 <0.30   BNP: No components found with this basename: POCBNP:5 CBG:  Lab 03/28/12 0533  GLUCAP 151*    No results found for this or any previous visit (from the past 240 hour(s)).   Scheduled Meds:    . ipratropium  0.5 mg Nebulization Q6H   And  . albuterol  2.5 mg Nebulization Q6H  . heparin  5,000 Units  Subcutaneous Q8H  . levofloxacin  750 mg Oral Daily  . loratadine  10 mg Oral Daily  . methylPREDNISolone (SOLU-MEDROL) injection  60 mg Intravenous Q6H  . mometasone-formoterol  2 puff Inhalation BID  . pantoprazole  40 mg Oral Daily  . predniSONE  40 mg Oral Q breakfast   Continuous Infusions:    Teonna Coonan A, MD [facility] Pager [phone]  If 7PM-7AM, please contact night-coverage [email or web address] Password [email or web address] 03/28/2012, 9:59 AM   LOS: 6 days

## 2012-03-28 NOTE — Progress Notes (Signed)
At 2145, patient's B/P was 173/94.  He was complaining of headache and lower mid abdominal pain - rating as 7/10.  He complained of increasing urinary frequency and urgency and only being able to urinate a little bit at a time.  He does not feel like his bladder is being emptied.  Abdomen is tender to touch.  Called and made [facility] aware.  Received order for 5 mg IV Hydralazine x 1 which was given at 2330.  At 0030, B/P was 154/90.  UA was obtained and showed glucose greater than 1000.  CBG was checked at 0533 and was 151.  One Vicodin was given at 2329 for headache and abdominal pain.  He did feel some relief.  Another Vicodin was given at [phone] for abdominal pain - pain rated at 4/10.  Bladder scan done this morning and shows around 18 cc urine in bladder.  Urinary urgency and frequency persist.  [facility] aware.  Will continue to monitor patient.  Gracelynn Bircher RN, Justine Null

## 2012-03-29 LAB — BASIC METABOLIC PANEL
Calcium: 8.3 mg/dL — ABNORMAL LOW (ref 8.4–10.5)
GFR calc non Af Amer: >90 mL/min (ref >90)
Sodium: 138 mEq/L (ref 135–145)

## 2012-03-29 LAB — CBC
MCH: 30.2 pg (ref 26.0–34.0)
MCHC: 33.9 g/dL (ref 30.0–36.0)
Platelets: 124 10*3/uL — ABNORMAL LOW (ref 150–400)
RBC: 3.88 MIL/uL — ABNORMAL LOW (ref 4.22–5.81)

## 2012-03-29 MED ORDER — OXYCODONE HCL 5 MG PO TABS
5.0000 mg | ORAL_TABLET | ORAL | Status: DC | PRN
  Administered 2012-03-31: 5 mg via ORAL
  Filled 2012-03-29: qty 1

## 2012-03-29 MED ORDER — IPRATROPIUM BROMIDE 0.02 % IN SOLN
0.5000 mg | Freq: Four times a day (QID) | RESPIRATORY_TRACT | Status: DC
  Administered 2012-03-30 (×2): 0.5 mg via RESPIRATORY_TRACT
  Filled 2012-03-29 (×2): qty 2.5

## 2012-03-29 MED ORDER — ALBUTEROL SULFATE (5 MG/ML) 0.5% IN NEBU
2.5000 mg | INHALATION_SOLUTION | Freq: Four times a day (QID) | RESPIRATORY_TRACT | Status: DC
  Administered 2012-03-30 (×2): 2.5 mg via RESPIRATORY_TRACT
  Filled 2012-03-29 (×2): qty 0.5

## 2012-03-29 NOTE — Progress Notes (Signed)
Patient ID: [name]  male  ZOX:[id number]    DOB: [birth year]    DOA: 03/22/2012  PCP: No primary provider on file.  Assessment/Plan:  COPD exacerbation with influenza A+ - Slow improvement but progressing, still wheezy  - Will continue current management for now, hopefully stable enough for DC tomorrow.  - Expected slow recovery do to concomitant influenza. Completed course of antiviral therapy. Continue albuterol and Atrovent nebulizer and Dulera.  - Continue nebulizers to every 6 hours, recurrent   Acute influenza  Continue oseltamivir--finished Tamiflu on 03/27/2012. Expect prolonged viral shedding do to concomitant steroids.   Tobacco abuse  Counseled the patient for Tobacco cessation   Left leg pain: Ordered Doppler ultrasound of the lower extremity (patient states he has a history of PE and DVT) -Continue heparin subcutaneous  Atopic dermatitis  Sarna when necessary   Leukopenia/thrombocytopenia  Appears to be chronic. Stable.   Hypophosphatemia  Repleted   DVT Prophylaxis: Heparin subcutaneous  Code Status:  Disposition: Home  tomorrow    Subjective: Patient states that it hurts in his lower activities when he ambulates, still wheezy.  Objective: Weight change: 2 kg (4 lb 6.6 oz)  Intake/Output Summary (Last 24 hours) at 03/29/12 1501 Last data filed at 03/29/12 0900  Gross per 24 hour  Intake    480 ml  Output   1025 ml  Net   -545 ml   Blood pressure 134/75, pulse 74, temperature 97.8 F (36.6 C), temperature source Oral, resp. rate 18, height 6\' 2"  (1.88 m), weight 89.5 kg (197 lb 5 oz), SpO2 96.00%.  Physical Exam: General: Alert and awake, oriented x3, not in any acute distress. HEENT: anicteric sclera, pupils reactive to light and accommodation, EOMI CVS: S1-S2 clear, no murmur rubs or gallops Chest: Bilateral wheezing  Abdomen: soft nontender, nondistended, normal bowel sounds, no organomegaly Extremities: no cyanosis, clubbing. Trace  edema noted bilaterally   Lab Results: Basic Metabolic Panel:  Lab 03/29/12 8119 03/27/12 0530  NA 138 142  K 4.0 3.4*  CL 100 106  CO2 26 28  GLUCOSE 166* 179*  BUN 17 16  CREATININE 0.59 0.52  CALCIUM 8.3* 8.6  MG -- --  PHOS -- 3.0     Lab 03/29/12 0515 03/26/12 0120 03/22/12 1858  WBC 3.6* 2.7* --  NEUTROABS -- -- 2.6  HGB 11.7* 11.7* --  HCT 34.5* 34.0* --  MCV 88.9 90.7 --  PLT 124* 111* --   Cardiac Enzymes:  Lab 03/26/12 2042 03/26/12 1519 03/26/12 0120  CKTOTAL -- -- --  CKMB -- -- --  CKMBINDEX -- -- --  TROPONINI <0.30 <0.30 <0.30   BNP: No components found with this basename: POCBNP:2 CBG:  Lab 03/28/12 0533  GLUCAP 151*     Micro Results: No results found for this or any previous visit (from the past 240 hour(s)).  Studies/Results: Dg Chest 2 View  03/22/2012  *RADIOLOGY REPORT*  Clinical Data: Short of breath  CHEST - 2 VIEW  Comparison: 02/10/2012  Findings: Heart size is normal.  No pleural effusion or edema.  No airspace consolidation.  Chronic bronchitic changes are noted bilaterally.  Visualized osseous structures are unremarkable.  IMPRESSION:  1.  Chronic bronchitic changes. 2.  No pneumonia.   Original Report Authenticated By: Signa Kell, M.D.     Medications: Scheduled Meds:   . ipratropium  0.5 mg Nebulization Q6H   And  . albuterol  2.5 mg Nebulization Q6H  . heparin  5,000 Units Subcutaneous Q8H  .  levofloxacin  750 mg Oral Daily  . loratadine  10 mg Oral Daily  . mometasone-formoterol  2 puff Inhalation BID  . pantoprazole  40 mg Oral Daily  . predniSONE  40 mg Oral Q breakfast      LOS: 7 days   Gorman Safi M.D. [facility] 03/29/2012, 3:01 PM Pager: [phone]  If 7PM-7AM, please contact night-coverage [email or web address] Password [email or web address]

## 2012-03-30 DIAGNOSIS — M79609 Pain in unspecified limb: Secondary | ICD-10-CM

## 2012-03-30 MED ORDER — IPRATROPIUM BROMIDE 0.02 % IN SOLN
0.5000 mg | RESPIRATORY_TRACT | Status: DC
  Administered 2012-03-30 – 2012-03-31 (×4): 0.5 mg via RESPIRATORY_TRACT
  Filled 2012-03-30 (×4): qty 2.5

## 2012-03-30 MED ORDER — GUAIFENESIN ER 600 MG PO TB12
1200.0000 mg | ORAL_TABLET | Freq: Two times a day (BID) | ORAL | Status: DC
  Administered 2012-03-30 – 2012-03-31 (×3): 1200 mg via ORAL
  Filled 2012-03-30 (×5): qty 2

## 2012-03-30 MED ORDER — METHYLPREDNISOLONE SODIUM SUCC 125 MG IJ SOLR
60.0000 mg | Freq: Four times a day (QID) | INTRAMUSCULAR | Status: DC
  Administered 2012-03-30 – 2012-03-31 (×4): 60 mg via INTRAVENOUS
  Filled 2012-03-30 (×8): qty 0.96

## 2012-03-30 MED ORDER — ALBUTEROL SULFATE (5 MG/ML) 0.5% IN NEBU
2.5000 mg | INHALATION_SOLUTION | RESPIRATORY_TRACT | Status: DC
  Administered 2012-03-30 – 2012-03-31 (×4): 2.5 mg via RESPIRATORY_TRACT
  Filled 2012-03-30 (×4): qty 0.5

## 2012-03-30 NOTE — Progress Notes (Signed)
INITIAL NUTRITION ASSESSMENT  DOCUMENTATION CODES Per approved criteria  -Not Applicable   INTERVENTION: None needed at this time.  NUTRITION DIAGNOSIS: No nutrition diagnosis at this time.   Goal: Intake to meet >90% of estimated nutrition needs, met.  Monitor:  PO intake, weight trend.  Reason for Assessment: MD Consult  52 y.o. male  Admitting Dx: Asthma exacerbation  ASSESSMENT: Patient reports that he is eating very well, consuming 100% of meals.  Has not lost any weight.  Patient says that he has been eating well and gaining weight due to steroids.  Height: Ht Readings from Last 1 Encounters:  03/24/12 6\' 2"  (1.88 m)    Weight: Wt Readings from Last 1 Encounters:  03/29/12 197 lb 5 oz (89.5 kg)    Ideal Body Weight: 86.4 kg  % Ideal Body Weight: 104%  Wt Readings from Last 10 Encounters:  03/29/12 197 lb 5 oz (89.5 kg)  02/14/12 185 lb 13.6 oz (84.3 kg)    Usual Body Weight: 185 lb  % Usual Body Weight: 106%  BMI:  Body mass index is 25.33 kg/(m^2).  Estimated Nutritional Needs: Kcal: 2150-2350 Protein: 105-120 gm Fluid: 2.3 L  Skin: No problems noted  Diet Order: General  EDUCATION NEEDS: -No education needs identified at this time   Intake/Output Summary (Last 24 hours) at 03/30/12 1542 Last data filed at 03/29/12 2059  Gross per 24 hour  Intake    240 ml  Output    100 ml  Net    140 ml    Last BM: 1/2   Labs:   Lab 03/29/12 0515 03/27/12 0530 03/26/12 0120 03/25/12 0805  NA 138 142 139 --  K 4.0 3.4* 3.5 --  CL 100 106 106 --  CO2 26 28 25  --  BUN 17 16 13  --  CREATININE 0.59 0.52 0.62 --  CALCIUM 8.3* 8.6 8.8 --  MG -- -- -- --  PHOS -- 3.0 2.2* 2.2*  GLUCOSE 166* 179* 165* --    CBG (last 3)   Basename 03/28/12 0533  GLUCAP 151*    Scheduled Meds:   . ipratropium  0.5 mg Nebulization Q4H   And  . albuterol  2.5 mg Nebulization Q4H  . guaiFENesin  1,200 mg Oral BID  . heparin  5,000 Units Subcutaneous  Q8H  . levofloxacin  750 mg Oral Daily  . loratadine  10 mg Oral Daily  . methylPREDNISolone (SOLU-MEDROL) injection  60 mg Intravenous Q6H  . mometasone-formoterol  2 puff Inhalation BID  . pantoprazole  40 mg Oral Daily    Continuous Infusions:   Past Medical History  Diagnosis Date  . Asthma   . DVT (deep venous thrombosis)   . Pulmonary embolism     History reviewed. No pertinent past surgical history.  Joaquin Courts, RD, LDN, CNSC Pager# [phone] After Hours Pager# [phone]

## 2012-03-30 NOTE — Progress Notes (Addendum)
*  Preliminary Results* Bilateral lower extremity venous duplex completed. There is evidence of subacute deep vein thrombosis involving the left saphenofemoral junction and bilateral popliteal veins. There is evidence of chronic deep vein thrombosis involving bilateral femoral veins. Unable to visualize all segments of bilateral posterior tibial and peroneal veins, therefore unable to evaluate these veins for deep vein thrombosis. Preliminary results discussed with Fannie Knee, RN.  03/30/2012 2:15 PM Gertie Fey, RDMS, RDCS

## 2012-03-30 NOTE — Progress Notes (Signed)
Patient ID: [name]  male  ZOX:[id number]    DOB: [birth year]    DOA: 03/22/2012  PCP: No primary provider on file.  Assessment/Plan:  COPD exacerbation with influenza A+:   still very wheezy  - Inc nebs to q4hours, cont dulera, IV steroids today, pulmonary toiletting, Flutter valve, IS - placed on PPI and mucinex.  - Expected slow recovery due to concomitant influenza. Completed course of antiviral therapy.  - initiate COPD gold protocol  Acute influenza  Continue oseltamivir--finished Tamiflu on 03/27/2012. Expect prolonged viral shedding do to concomitant steroids.   Tobacco abuse  Counseled the patient for Tobacco cessation   Left leg pain: Ordered Doppler ultrasound of the lower extremity (patient states he has a history of PE and DVT) -Continue heparin subcutaneous  Atopic dermatitis  Sarna when necessary   Leukopenia/thrombocytopenia  Appears to be chronic. Stable.   DVT Prophylaxis: Heparin subcutaneous  Code Status:  Disposition: if no improvement tomorrow, will consult pulmonology  Subjective: Still very wheezy and coughing   Objective: Weight change: 0 kg (0 oz)  Intake/Output Summary (Last 24 hours) at 03/30/12 1404 Last data filed at 03/29/12 2059  Gross per 24 hour  Intake    240 ml  Output    100 ml  Net    140 ml   Blood pressure 126/78, pulse 70, temperature 97.2 F (36.2 C), temperature source Oral, resp. rate 18, height 6\' 2"  (1.88 m), weight 89.5 kg (197 lb 5 oz), SpO2 98.00%.  Physical Exam: General: Alert and awake, oriented x3, not in any acute distress. HEENT: anicteric sclera, pupils reactive to light and accommodation, EOMI CVS: S1-S2 clear, no murmur rubs or gallops Chest: Bilateral wheezing, no sig improvement from yesterday  Abdomen: soft nontender, nondistended, normal bowel sounds,  Extremities: no cyanosis, clubbing. Trace edema noted bilaterally   Lab Results: Basic Metabolic Panel:  Lab 03/29/12 8119 03/27/12 0530  NA  138 142  K 4.0 3.4*  CL 100 106  CO2 26 28  GLUCOSE 166* 179*  BUN 17 16  CREATININE 0.59 0.52  CALCIUM 8.3* 8.6  MG -- --  PHOS -- 3.0     Lab 03/29/12 0515 03/26/12 0120  WBC 3.6* 2.7*  NEUTROABS -- --  HGB 11.7* 11.7*  HCT 34.5* 34.0*  MCV 88.9 90.7  PLT 124* 111*   Cardiac Enzymes:  Lab 03/26/12 2042 03/26/12 1519 03/26/12 0120  CKTOTAL -- -- --  CKMB -- -- --  CKMBINDEX -- -- --  TROPONINI <0.30 <0.30 <0.30   BNP: No components found with this basename: POCBNP:2 CBG:  Lab 03/28/12 0533  GLUCAP 151*     Micro Results: No results found for this or any previous visit (from the past 240 hour(s)).  Studies/Results: Dg Chest 2 View  03/22/2012  *RADIOLOGY REPORT*  Clinical Data: Short of breath  CHEST - 2 VIEW  Comparison: 02/10/2012  Findings: Heart size is normal.  No pleural effusion or edema.  No airspace consolidation.  Chronic bronchitic changes are noted bilaterally.  Visualized osseous structures are unremarkable.  IMPRESSION:  1.  Chronic bronchitic changes. 2.  No pneumonia.   Original Report Authenticated By: Signa Kell, M.D.     Medications: Scheduled Meds:    . ipratropium  0.5 mg Nebulization Q4H   And  . albuterol  2.5 mg Nebulization Q4H  . heparin  5,000 Units Subcutaneous Q8H  . levofloxacin  750 mg Oral Daily  . loratadine  10 mg Oral Daily  . methylPREDNISolone (  SOLU-MEDROL) injection  60 mg Intravenous Q6H  . mometasone-formoterol  2 puff Inhalation BID  . pantoprazole  40 mg Oral Daily      LOS: 8 days   Amayra Kiedrowski M.D. [facility] 03/30/2012, 2:04 PM Pager: [phone]  If 7PM-7AM, please contact night-coverage [email or web address] Password [email or web address]

## 2012-03-31 ENCOUNTER — Inpatient Hospital Stay (HOSPITAL_COMMUNITY): Payer: Medicaid Other

## 2012-03-31 LAB — PROTIME-INR
INR: 0.85 (ref 0.00–1.49)
Prothrombin Time: 11.6 seconds (ref 11.6–15.2)

## 2012-03-31 MED ORDER — COUMADIN BOOK
Freq: Once | Status: DC
  Filled 2012-03-31: qty 1

## 2012-03-31 MED ORDER — IPRATROPIUM BROMIDE 0.02 % IN SOLN: 0.5000 mg | RESPIRATORY_TRACT | Status: DC

## 2012-03-31 MED ORDER — ALBUTEROL SULFATE (5 MG/ML) 0.5% IN NEBU
2.5000 mg | INHALATION_SOLUTION | Freq: Three times a day (TID) | RESPIRATORY_TRACT | Status: DC
  Administered 2012-03-31: 2.5 mg via RESPIRATORY_TRACT
  Filled 2012-03-31: qty 0.5

## 2012-03-31 MED ORDER — DM-GUAIFENESIN ER 30-600 MG PO TB12
2.0000 | ORAL_TABLET | Freq: Two times a day (BID) | ORAL | Status: DC
  Administered 2012-03-31 – 2012-04-02 (×4): 2 via ORAL
  Filled 2012-03-31 (×5): qty 2

## 2012-03-31 MED ORDER — WARFARIN VIDEO: Freq: Once | Status: DC

## 2012-03-31 MED ORDER — ENOXAPARIN SODIUM 100 MG/ML ~~LOC~~ SOLN
90.0000 mg | Freq: Two times a day (BID) | SUBCUTANEOUS | Status: DC
  Administered 2012-03-31: 90 mg via SUBCUTANEOUS
  Administered 2012-04-01: 06:00:00 via SUBCUTANEOUS
  Filled 2012-03-31 (×5): qty 1

## 2012-03-31 MED ORDER — BUDESONIDE-FORMOTEROL FUMARATE 160-4.5 MCG/ACT IN AERO
2.0000 | INHALATION_SPRAY | Freq: Two times a day (BID) | RESPIRATORY_TRACT | Status: DC
  Administered 2012-03-31 – 2012-04-02 (×4): 2 via RESPIRATORY_TRACT
  Filled 2012-03-31: qty 6

## 2012-03-31 MED ORDER — WARFARIN SODIUM 10 MG PO TABS
10.0000 mg | ORAL_TABLET | Freq: Once | ORAL | Status: AC
  Administered 2012-03-31: 10 mg via ORAL
  Filled 2012-03-31: qty 1

## 2012-03-31 MED ORDER — IPRATROPIUM BROMIDE 0.02 % IN SOLN
0.5000 mg | Freq: Three times a day (TID) | RESPIRATORY_TRACT | Status: DC
  Administered 2012-03-31: 0.5 mg via RESPIRATORY_TRACT
  Filled 2012-03-31: qty 2.5

## 2012-03-31 MED ORDER — ALBUTEROL SULFATE (5 MG/ML) 0.5% IN NEBU: 2.5000 mg | INHALATION_SOLUTION | Freq: Three times a day (TID) | RESPIRATORY_TRACT | Status: DC

## 2012-03-31 MED ORDER — ALBUTEROL SULFATE (5 MG/ML) 0.5% IN NEBU: 2.5000 mg | INHALATION_SOLUTION | RESPIRATORY_TRACT | Status: DC | PRN

## 2012-03-31 MED ORDER — PANTOPRAZOLE SODIUM 40 MG PO TBEC
40.0000 mg | DELAYED_RELEASE_TABLET | Freq: Two times a day (BID) | ORAL | Status: DC
  Administered 2012-03-31 – 2012-04-02 (×4): 40 mg via ORAL
  Filled 2012-03-31 (×2): qty 1
  Filled 2012-03-31: qty 2

## 2012-03-31 MED ORDER — WARFARIN - PHARMACIST DOSING INPATIENT: Freq: Every day | Status: DC

## 2012-03-31 MED ORDER — METHYLPREDNISOLONE SODIUM SUCC 125 MG IJ SOLR
80.0000 mg | Freq: Three times a day (TID) | INTRAMUSCULAR | Status: DC
  Administered 2012-03-31 – 2012-04-01 (×2): 80 mg via INTRAVENOUS
  Filled 2012-03-31 (×5): qty 1.28

## 2012-03-31 NOTE — Progress Notes (Signed)
ANTICOAGULATION CONSULT NOTE - Initial Consult  Pharmacy Consult for Coumadin/Lovenox Indication: VTE treatment  No Known Allergies  Patient Measurements: Height: 6\' 2"  (188 cm) Weight: 199 lb (90.266 kg) IBW/kg (Calculated) : 82.2   Vital Signs: Temp: 98.5 F (36.9 C) (01/05 1353) Temp src: Oral (01/05 1353) BP: 133/87 mmHg (01/05 1353) Pulse Rate: 64  (01/05 1353)  Labs:  Basename 03/29/12 0515  HGB 11.7*  HCT 34.5*  PLT 124*  APTT --  LABPROT --  INR --  HEPARINUNFRC --  CREATININE 0.59  CKTOTAL --  CKMB --  TROPONINI --    Estimated Creatinine Clearance: 127 ml/min (by C-G formula based on Cr of 0.59).   Medical History: Past Medical History  Diagnosis Date  . Asthma   . DVT (deep venous thrombosis)   . Pulmonary embolism     Medications:  Scheduled:    . ipratropium  0.5 mg Nebulization TID   And  . albuterol  2.5 mg Nebulization TID  . coumadin book   Does not apply Once  . guaiFENesin  1,200 mg Oral BID  . levofloxacin  750 mg Oral Daily  . loratadine  10 mg Oral Daily  . methylPREDNISolone (SOLU-MEDROL) injection  60 mg Intravenous Q6H  . mometasone-formoterol  2 puff Inhalation BID  . pantoprazole  40 mg Oral Daily  . warfarin  10 mg Oral ONCE-1800  . warfarin   Does not apply Once  . Warfarin - Pharmacist Dosing Inpatient   Does not apply q1800  . [DISCONTINUED] albuterol  2.5 mg Nebulization Q4H  . [DISCONTINUED] albuterol  2.5 mg Nebulization TID  . [DISCONTINUED] heparin  5,000 Units Subcutaneous Q8H  . [DISCONTINUED] ipratropium  0.5 mg Nebulization Q4H  . [DISCONTINUED] ipratropium  0.5 mg Nebulization Q4H    Assessment: 52 year old male with history of DVT/PE (on no anticoagulants PTA) now with subacute DVT to begin Coumadin and Lovenox. Patient has been receiving prophylactic heparin 5000 units every 8 hours during this admission with the last dose charted 03/30/12 around 2100. CBC is slightly low but stable with H/H of 11.7/34.5  and platelets of 124. STAT INR pending, INR on 02/10/12 was 0.94.   Patient's weight 90.3 kg. Creatinine 0.59; creatinine clearance >100 ml/min.   Goal of Therapy:  INR 2-3 Anti-Xa level 0.6-1.2 units/ml 4hrs after LMWH dose given Monitor platelets by anticoagulation protocol: Yes   Plan:  1. Lovenox 90 mg SQ every 12 hours 2. Coumadin 10mg  x1 tonight at 1800 3. F/u daily PT/INR 4. CBC q 3 days 5. Coumadin book/video ordered 6. Patient education  Lillia Pauls, PharmD Clinical Pharmacist Pager: [phone] Phone: [phone] 03/31/2012 3:02 PM

## 2012-03-31 NOTE — Consult Note (Signed)
Name: [name] MRN: [id number] DOB: 23-Jul-1960    LOS: 9  Referring Provider:  Dr Isidoro Donning, [facility] Reason for Referral:  Refractory asthma    PULMONARY / CRITICAL CARE MEDICINE  HPI:  42 yobm active smoker "fine for months" s rx until Fall 2013 with refractory cough and wheeze since then admit 12/27 with another flare in setting of Pos Influenza A by PCR while on advair though compliance is questionable at best.  Even as inpt however has not improved to point ready for discharge with sob across the room and congested cough on exp. No purulent sputum.  At baseline Sleeping ok without nocturnal  or early am exacerbation  of respiratory  c/o's or need for noct saba. Also denies any obvious fluctuation of symptoms with weather or environmental changes or other aggravating or alleviating factors except as outlined above   ROS  The following are not active complaints unless bolded sore throat, dysphagia, dental problems, itching, sneezing,  nasal congestion or excess/ purulent secretions, ear ache,   fever, chills, sweats, unintended wt loss, pleuritic or exertional cp, hemoptysis,  orthopnea pnd or leg swelling, presyncope, palpitations, heartburn, abdominal pain, anorexia, nausea, vomiting, diarrhea  or change in bowel or urinary habits, change in stools or urine, dysuria,hematuria,  rash, arthralgias, visual complaints, headache, numbness weakness or ataxia or problems with walking or coordination,  change in mood/affect or memory.      Past Medical History  Diagnosis Date  . Asthma   . DVT (deep venous thrombosis)   . Pulmonary embolism    History reviewed. No pertinent past surgical history. Prior to Admission medications   Medication Sig Start Date End Date Taking? Authorizing Provider  Fluticasone-Salmeterol (ADVAIR DISKUS) 250-50 MCG/DOSE AEPB Inhale 1 puff into the lungs 2 (two) times daily. 02/14/12  Yes Vassie Loll, MD  hydrocortisone cream 1 % Apply topically 2 (two) times  daily. Back itching 02/14/12  Yes Vassie Loll, MD  Ipratropium-Albuterol (COMBIVENT RESPIMAT) 20-100 MCG/ACT AERS respimat Inhale 1 puff into the lungs every 6 (six) hours as needed for wheezing or shortness of breath. 02/14/12  Yes Vassie Loll, MD  loratadine (CLARITIN) 10 MG tablet Take 1 tablet (10 mg total) by mouth daily. 02/14/12  Yes Vassie Loll, MD  pantoprazole (PROTONIX) 40 MG tablet Take 1 tablet (40 mg total) by mouth daily. 02/14/12  Yes Vassie Loll, MD   Allergies No Known Allergies  Family History History reviewed. No pertinent family history. Social History  reports that he has been smoking.  He does not have any smokeless tobacco history on file. He reports that he drinks alcohol. His drug history not on file.     Vital Signs: Temp:  [97.4 F (36.3 C)-98.5 F (36.9 C)] 98.5 F (36.9 C) (01/05 1353) Pulse Rate:  [64-86] 64  (01/05 1353) Resp:  [18] 18  (01/05 1353) BP: (133-159)/(75-91) 133/87 mmHg (01/05 1353) SpO2:  [95 %-100 %] 100 % (01/05 1428) Weight:  [199 lb (90.266 kg)] 199 lb (90.266 kg) (01/04 2123) FIO 2  RA  Physical Examination: General:  Friendly but anxious amb bm nad somewhat tremulous HEENT mild turbinate edema.  Oropharynx no thrush or excess pnd or cobblestoning.  No JVD or cervical adenopathy. Mild accessory muscle hypertrophy. Trachea midline, nl thryroid. Chest was hyperinflated by percussion with diminished breath sounds and moderate increased exp time with mid exp wheeze. Hoover sign positive at mid inspiration. Regular rate and rhythm without murmur gallop or rub or increase P2 or edema.  Abd: no hsm, nl excursion. Ext warm without cyanosis or clubbing.    Active Problems:  Asthma with COPD with exacerbation  Rash of back  Influenza A  History of noncompliance with medical treatment  Atopic dermatitis  Hypophosphatemia  Leukopenia    DDX of  difficult airways managment all start with A and  include Adherence, Ace Inhibitors,  Acid Reflux, Active Sinus Disease, Alpha 1 Antitripsin deficiency, Anxiety masquerading as Airways dz,  ABPA,  allergy(esp in young), Aspiration (esp in elderly), Adverse effects of DPI,  Active smokers, plus two Bs  = Bronchiectasis and Beta blocker use..and one C= CHF   Adherence is always the initial "prime suspect" and is a multilayered concern that requires a "trust but verify" approach in every patient - starting with knowing how to use medications, especially inhalers, correctly, keeping up with refills and understanding the fundamental difference between maintenance and prns vs those medications only taken for a very short course and then stopped and not refilled. The proper method of use, as well as anticipated side effects, of a metered-dose inhaler are discussed and demonstrated to the patient. Improved effectiveness after extensive coaching during this visit to a level of approximately  90% so should be ok with symbicort 160 2bid and tudorza or spiriva one daily plus tapering course of prednisone and f/u by me in office in 1week  ? Acid reflux > max rx needed on ppi bid ac  ? Active sinus dz >  Ordered  Active smoking > discussed with pt.  Allergies/ abpa > will check IgE at f/u ov off prednisone.  Bronchiectasis rulesd out by ct chest 02/10/12  ? CHF > check bnp to be complete   No need for further f/u as inpt by pulmonary but please call if questions    Sandrea Hughs, MD Pulmonary and Critical Care Medicine [facility] Cell [phone] After 5:30 PM or weekends, call [phone]      Sandrea Hughs, M.D. Pulmonary and Critical Care Medicine [facility] Pager: [phone]  03/31/2012, 3:42 PM

## 2012-03-31 NOTE — Progress Notes (Signed)
Physical Therapy Evaluation Patient Details Name: [name] MRN: [id number] DOB: Oct 08, 1960 Today's Date: 03/31/2012 Time: 4098-1191 PT Time Calculation (min): 9 min  PT Assessment / Plan / Recommendation Clinical Impression  52 yo male admitted with COPD exacerbation in setting of medical noncompliance: Presents to PT moving and ambulating independently; No PT needs identified at this time    PT Assessment  Patent does not need any further PT services    Follow Up Recommendations  No PT follow up;Supervision - Intermittent    Does the patient have the potential to tolerate intense rehabilitation      Barriers to Discharge        Equipment Recommendations  None recommended by PT    Recommendations for Other Services     Frequency      Precautions / Restrictions Precautions Precautions: None Precaution Comments: Cued pt to self-monitor for activity tol   Pertinent Vitals/Pain Session conducted on Room Air; O2 sats 98-99%, HR 107      Mobility  Bed Mobility Bed Mobility: Supine to Sit Supine to Sit: 7: Independent Transfers Transfers: Sit to Stand;Stand to Sit Sit to Stand: 7: Independent Stand to Sit: 7: Independent Details for Transfer Assistance: Smooth transition Ambulation/Gait Ambulation/Gait Assistance: 7: Independent Ambulation Distance (Feet): 200 Feet Assistive device: None Ambulation/Gait Assistance Details: Gait grossly WNL, without noted loss of balance; Occasional cough; Pt was able to self-monitor for activity tolerance Gait Pattern: Within Functional Limits Gait velocity: WFL Stairs: Yes Stairs Assistance: 5: Supervision Stairs Assistance Details (indicate cue type and reason): Mostly cues simply to self-monitor for activity tol; managed 3 steps to enter well Stair Management Technique: One rail Right Number of Stairs: 3     Shoulder Instructions     Exercises     PT Diagnosis:    PT Problem List:   PT Treatment Interventions:      PT Goals    Visit Information  Last PT Received On: 03/31/12 Assistance Needed: +1    Subjective Data  Subjective: Agreeable to amb Patient Stated Goal: feel better   Prior Functioning  Home Living Lives With: Significant other Available Help at Discharge: Available PRN/intermittently Type of Home: House Home Access: Stairs to enter Secretary/administrator of Steps: 3 Entrance Stairs-Rails: None Home Layout: One level Prior Function Level of Independence: Independent Able to Take Stairs?: Yes Driving: Yes Communication Communication: No difficulties    Cognition  Overall Cognitive Status: Appears within functional limits for tasks assessed/performed Arousal/Alertness: Awake/alert Orientation Level: Appears intact for tasks assessed Behavior During Session: [facility] for tasks performed    Extremity/Trunk Assessment Right Upper Extremity Assessment RUE ROM/Strength/Tone: Within functional levels Left Upper Extremity Assessment LUE ROM/Strength/Tone: Within functional levels Right Lower Extremity Assessment RLE ROM/Strength/Tone: Within functional levels Left Lower Extremity Assessment LLE ROM/Strength/Tone: Within functional levels Trunk Assessment Trunk Assessment: Normal   Balance    End of Session PT - End of Session Activity Tolerance: Patient tolerated treatment well Patient left: Other (comment) (moving independently in room) Nurse Communication: Mobility status  GP     [name] [facility], [place] [phone]  03/31/2012, 11:19 AM

## 2012-03-31 NOTE — Progress Notes (Signed)
Patient ID: [name]  male  UJW:[id number]    DOB: 1961/01/28    DOA: 03/22/2012  PCP: No primary provider on file.  Assessment/Plan:  Bronchiectasis with COPD exacerbation with influenza A+:  still very wheezy, no significant improvement despite maximizing therapy  - On nebs q4hours, cont dulera, IV steroids, pulmonary toiletting, Flutter valve, IS - placed on PPI and mucinex.  - Expected slow recovery due to concomitant influenza. Completed course of antiviral therapy.  - initiate COPD gold protocol. Will call pulmonology consult for recommendations, d/w Dr Bary Richard   Acute influenza  finished Tamiflu on 03/27/2012. Expect prolonged viral shedding do to concomitant steroids.   Tobacco abuse  Counseled the patient for Tobacco cessation   Left leg pain with B/L subacute DVT:   - place on full dose lovenox  - pharmacy consult for coumadin, (patient states he has a history of PE and DVT)  Atopic dermatitis  Sarna when necessary   Leukopenia/thrombocytopenia  Appears to be chronic. Stable.   Code Status: FC  Disposition:   Subjective: Still very wheezy   Objective: Weight change: 0.765 kg (1 lb 11 oz)  Intake/Output Summary (Last 24 hours) at 03/31/12 1309 Last data filed at 03/31/12 0300  Gross per 24 hour  Intake      0 ml  Output    950 ml  Net   -950 ml   Blood pressure 159/91, pulse 65, temperature 97.4 F (36.3 C), temperature source Oral, resp. rate 18, height 6\' 2"  (1.88 m), weight 90.266 kg (199 lb), SpO2 96.00%.  Physical Exam: General: Alert and awake, oriented x3, not in any acute distress. HEENT: anicteric sclera, pupils reactive to light and accommodation, EOMI CVS: S1-S2 clear, no murmur rubs or gallops Chest: Bilateral wheezing, no sig improvement from yesterday  Abdomen: soft NT, ND, NBS  Extremities: no cyanosis, clubbing. Trace edema noted bilaterally   Lab Results: Basic Metabolic Panel:  Lab 03/29/12 5621 03/27/12 0530  NA 138 142  K  4.0 3.4*  CL 100 106  CO2 26 28  GLUCOSE 166* 179*  BUN 17 16  CREATININE 0.59 0.52  CALCIUM 8.3* 8.6  MG -- --  PHOS -- 3.0     Lab 03/29/12 0515 03/26/12 0120  WBC 3.6* 2.7*  NEUTROABS -- --  HGB 11.7* 11.7*  HCT 34.5* 34.0*  MCV 88.9 90.7  PLT 124* 111*   Cardiac Enzymes:  Lab 03/26/12 2042 03/26/12 1519 03/26/12 0120  CKTOTAL -- -- --  CKMB -- -- --  CKMBINDEX -- -- --  TROPONINI <0.30 <0.30 <0.30   BNP: No components found with this basename: POCBNP:2 CBG:  Lab 03/28/12 0533  GLUCAP 151*     Micro Results: No results found for this or any previous visit (from the past 240 hour(s)).  Studies/Results: Dg Chest 2 View  03/22/2012  *RADIOLOGY REPORT*  Clinical Data: Short of breath  CHEST - 2 VIEW  Comparison: 02/10/2012  Findings: Heart size is normal.  No pleural effusion or edema.  No airspace consolidation.  Chronic bronchitic changes are noted bilaterally.  Visualized osseous structures are unremarkable.  IMPRESSION:  1.  Chronic bronchitic changes. 2.  No pneumonia.   Original Report Authenticated By: Signa Kell, M.D.     Medications: Scheduled Meds:    . ipratropium  0.5 mg Nebulization TID   And  . albuterol  2.5 mg Nebulization TID  . guaiFENesin  1,200 mg Oral BID  . heparin  5,000 Units Subcutaneous Q8H  .  levofloxacin  750 mg Oral Daily  . loratadine  10 mg Oral Daily  . methylPREDNISolone (SOLU-MEDROL) injection  60 mg Intravenous Q6H  . mometasone-formoterol  2 puff Inhalation BID  . pantoprazole  40 mg Oral Daily      LOS: 9 days   Dwaine Pringle M.D. [facility] 03/31/2012, 1:08 PM Pager: [phone]  If 7PM-7AM, please contact night-coverage [email or web address] Password [email or web address]

## 2012-04-01 ENCOUNTER — Inpatient Hospital Stay (HOSPITAL_COMMUNITY): Payer: Medicaid Other

## 2012-04-01 DIAGNOSIS — R109 Unspecified abdominal pain: Secondary | ICD-10-CM

## 2012-04-01 LAB — CBC
HCT: 35.5 % — ABNORMAL LOW (ref 39.0–52.0)
MCHC: 35.2 g/dL (ref 30.0–36.0)
Platelets: DECREASED 10*3/uL (ref 150–400)
RDW: 13.6 % (ref 11.5–15.5)
WBC: 4.5 10*3/uL (ref 4.0–10.5)

## 2012-04-01 LAB — PROTIME-INR
INR: 0.91 (ref 0.00–1.49)
Prothrombin Time: 12.2 seconds (ref 11.6–15.2)

## 2012-04-01 LAB — PRO B NATRIURETIC PEPTIDE: Pro B Natriuretic peptide (BNP): 337.3 pg/mL — ABNORMAL HIGH (ref 0–125)

## 2012-04-01 MED ORDER — METHYLPREDNISOLONE SODIUM SUCC 125 MG IJ SOLR
60.0000 mg | Freq: Three times a day (TID) | INTRAMUSCULAR | Status: DC
  Administered 2012-04-01 – 2012-04-02 (×3): 60 mg via INTRAVENOUS
  Filled 2012-04-01 (×6): qty 0.96

## 2012-04-01 MED ORDER — RIVAROXABAN 15 MG PO TABS
15.0000 mg | ORAL_TABLET | Freq: Two times a day (BID) | ORAL | Status: DC
  Administered 2012-04-01 – 2012-04-02 (×2): 15 mg via ORAL
  Filled 2012-04-01 (×4): qty 1

## 2012-04-01 MED ORDER — RIVAROXABAN 20 MG PO TABS: 20.0000 mg | ORAL_TABLET | Freq: Every day | ORAL | Status: DC

## 2012-04-01 MED ORDER — TIOTROPIUM BROMIDE MONOHYDRATE 18 MCG IN CAPS
18.0000 ug | ORAL_CAPSULE | Freq: Every day | RESPIRATORY_TRACT | Status: DC
  Administered 2012-04-01 – 2012-04-02 (×2): 18 ug via RESPIRATORY_TRACT
  Filled 2012-04-01: qty 5

## 2012-04-01 MED ORDER — ALBUTEROL SULFATE (5 MG/ML) 0.5% IN NEBU
2.5000 mg | INHALATION_SOLUTION | Freq: Three times a day (TID) | RESPIRATORY_TRACT | Status: DC
  Administered 2012-04-01 – 2012-04-02 (×3): 2.5 mg via RESPIRATORY_TRACT
  Filled 2012-04-01 (×3): qty 0.5

## 2012-04-01 NOTE — Care Management Note (Signed)
   CARE MANAGEMENT NOTE 04/01/2012  Patient:  [name]   Account Number:  [id number]  Date Initiated:  03/25/2012  Documentation initiated by:  [name]  Subjective/Objective Assessment:   04/01/2012 Order for "Gold Protocol" for this pt.     Action/Plan:   Per CM dept assist director, [name] this is not in effect at [facility], however an order for disease management would be appropriate. Met with pt who declined all HH for disease management or support.   Anticipated DC Date:  04/01/2012   Anticipated DC Plan:  HOME/SELF CARE         Choice offered to / List presented to:             Status of service:  Completed, signed off Medicare Important Message given?   (If response is "NO", the following Medicare IM given date fields will be blank) Date Medicare IM given:   Date Additional Medicare IM given:    Discharge Disposition:  HOME/SELF CARE  Per UR Regulation:    If discussed at Long Length of Stay Meetings, dates discussed:    Comments:

## 2012-04-01 NOTE — Progress Notes (Signed)
OT Cancellation Note  Patient Details Name: [name] MRN: [id number] DOB: July 20, 1960   Cancelled Treatment:    Reason Eval/Treat Not Completed: Other (comment) (Pt with no OT needs per his report.) Per PT eval pt was independent with mobility.  Went in and discussed current performance for basic selfcare tasks and he reports other than getting a little tired he has not OT needs.  Will sign off.  Harjas Biggins OTR/L Pager number [phone] 04/01/2012, 8:57 AM

## 2012-04-01 NOTE — Progress Notes (Signed)
Patient ID: [name]  male  WUJ:[id number]    DOB: 19-Jun-1960    DOA: 03/22/2012  PCP: No primary provider on file.  Assessment/Plan:  Bronchiectasis with COPD exacerbation with influenza A+:  ; somewhat improved from yesterday - Appreciate Pulmonology recommendations, started spiriva, alb Nebs, symbicort 160 2BID - Tapered IV steroids to 60mg  IVQ8, transition to oral prednisone with taper tomorrow - cont pulmonary toiletting, Flutter valve, IS - placed on PPI BID and mucinex. He does have sinusitis on CT, on claritin  - Expected slow recovery due to concomitant influenza. Completed course of antiviral therapy.   Acute influenza  finished Tamiflu on 03/27/2012. Expect prolonged viral shedding do to concomitant steroids.   Tobacco abuse  Counseled the patient for Tobacco cessation   Left leg pain with B/L subacute DVT involving the left saphenofemoral junction and bilateral popliteal veins: Recurrent, per patient had prior DVT and PE - placed on full dose lovenox, coumadin - Asked case management for xarelto approval. He will likely need long-tern anticoagulation or life long with recurrent hx    Atopic dermatitis  Sarna when necessary   Leukopenia/thrombocytopenia  Appears to be chronic. Stable.   Code Status: FC  Disposition: tomorrow, will need F/U appt with Dr Sherene Sires in 1 week  Subjective: Somewhat improved today, SOB and wheezing improving   Objective: Weight change: 0.454 kg (1 lb)  Intake/Output Summary (Last 24 hours) at 04/01/12 1359 Last data filed at 04/01/12 0900  Gross per 24 hour  Intake    240 ml  Output      0 ml  Net    240 ml   Blood pressure 127/84, pulse 92, temperature 98.4 F (36.9 C), temperature source Oral, resp. rate 18, height 6\' 2"  (1.88 m), weight 90.719 kg (200 lb), SpO2 95.00%.  Physical Exam: General: Alert and awake, oriented x3, not in any acute distress. HEENT: anicteric sclera, pupils reactive to light and accommodation,  EOMI CVS: S1-S2 clear, no murmur rubs or gallops Chest: mild scattered wheezing improving  Abdomen: soft NT, ND, NBS  Extremities: no cyanosis, clubbing. Trace edema noted bilaterally   Lab Results: Basic Metabolic Panel:  Lab 03/29/12 9562 03/27/12 0530  NA 138 142  K 4.0 3.4*  CL 100 106  CO2 26 28  GLUCOSE 166* 179*  BUN 17 16  CREATININE 0.59 0.52  CALCIUM 8.3* 8.6  MG -- --  PHOS -- 3.0     Lab 04/01/12 0853 03/29/12 0515  WBC 4.5 3.6*  NEUTROABS -- --  HGB 12.5* 11.7*  HCT 35.5* 34.5*  MCV 90.1 88.9  PLT PLATELET CLUMPS NOTED ON SMEAR, COUNT APPEARS DECREASED 124*   Cardiac Enzymes:  Lab 03/26/12 2042 03/26/12 1519 03/26/12 0120  CKTOTAL -- -- --  CKMB -- -- --  CKMBINDEX -- -- --  TROPONINI <0.30 <0.30 <0.30   BNP: No components found with this basename: POCBNP:2 CBG:  Lab 03/28/12 0533  GLUCAP 151*     Micro Results: No results found for this or any previous visit (from the past 240 hour(s)).  Studies/Results: Dg Chest 2 View  03/22/2012  *RADIOLOGY REPORT*  Clinical Data: Short of breath  CHEST - 2 VIEW  Comparison: 02/10/2012  Findings: Heart size is normal.  No pleural effusion or edema.  No airspace consolidation.  Chronic bronchitic changes are noted bilaterally.  Visualized osseous structures are unremarkable.  IMPRESSION:  1.  Chronic bronchitic changes. 2.  No pneumonia.   Original Report Authenticated By: Signa Kell, M.D.  Medications: Scheduled Meds:    . budesonide-formoterol  2 puff Inhalation BID  . coumadin book   Does not apply Once  . dextromethorphan-guaiFENesin  2 tablet Oral BID  . enoxaparin (LOVENOX) injection  90 mg Subcutaneous Q12H  . loratadine  10 mg Oral Daily  . methylPREDNISolone (SOLU-MEDROL) injection  60 mg Intravenous Q8H  . pantoprazole  40 mg Oral BID AC  . warfarin   Does not apply Once  . Warfarin - Pharmacist Dosing Inpatient   Does not apply q1800      LOS: 10 days   Amahd Morino  M.D. [facility] 04/01/2012, 1:59 PM Pager: [phone]  If 7PM-7AM, please contact night-coverage [email or web address] Password [email or web address]

## 2012-04-02 DIAGNOSIS — I82409 Acute embolism and thrombosis of unspecified deep veins of unspecified lower extremity: Secondary | ICD-10-CM

## 2012-04-02 MED ORDER — IPRATROPIUM-ALBUTEROL 18-103 MCG/ACT IN AERO: 2.0000 | INHALATION_SPRAY | Freq: Four times a day (QID) | RESPIRATORY_TRACT | Status: DC | PRN

## 2012-04-02 MED ORDER — PANTOPRAZOLE SODIUM 40 MG PO TBEC: 40.0000 mg | DELAYED_RELEASE_TABLET | Freq: Two times a day (BID) | ORAL | Status: DC

## 2012-04-02 MED ORDER — RIVAROXABAN 20 MG PO TABS: 20.0000 mg | ORAL_TABLET | Freq: Every day | ORAL | Status: DC

## 2012-04-02 MED ORDER — PREDNISONE 10 MG PO TABS: ORAL_TABLET | ORAL | Status: DC

## 2012-04-02 MED ORDER — RIVAROXABAN 15 MG PO TABS: 15.0000 mg | ORAL_TABLET | Freq: Two times a day (BID) | ORAL | Status: DC

## 2012-04-02 MED ORDER — TIOTROPIUM BROMIDE MONOHYDRATE 18 MCG IN CAPS: 18.0000 ug | ORAL_CAPSULE | Freq: Every day | RESPIRATORY_TRACT | Status: DC

## 2012-04-02 MED ORDER — BUDESONIDE-FORMOTEROL FUMARATE 160-4.5 MCG/ACT IN AERO: 2.0000 | INHALATION_SPRAY | Freq: Two times a day (BID) | RESPIRATORY_TRACT | Status: DC

## 2012-04-02 NOTE — Discharge Summary (Signed)
Physician Discharge Summary  Patient ID: [name] MRN: [id number] DOB/AGE: [birth year] 52 y.o.  Admit date: 03/22/2012 Discharge date: 04/02/2012  Discharge Diagnoses:  Active Problems:  Acute DVT (deep venous thrombosis)  Asthma with COPD with exacerbation  Rash of back  Influenza A  History of noncompliance with medical treatment  Atopic dermatitis  Hypophosphatemia  Leukopenia     Medication List     As of 04/02/2012 11:46 AM    TAKE these medications         budesonide-formoterol 160-4.5 MCG/ACT inhaler   Commonly known as: SYMBICORT   Inhale 2 puffs into the lungs 2 (two) times daily.      Fluticasone-Salmeterol 250-50 MCG/DOSE Aepb   Commonly known as: ADVAIR   Inhale 1 puff into the lungs 2 (two) times daily.      hydrocortisone cream 1 %   Apply topically 2 (two) times daily. Back itching      Ipratropium-Albuterol 20-100 MCG/ACT Aers respimat   Commonly known as: COMBIVENT   Inhale 1 puff into the lungs every 6 (six) hours as needed for wheezing or shortness of breath.      loratadine 10 MG tablet   Commonly known as: CLARITIN   Take 1 tablet (10 mg total) by mouth daily.      pantoprazole 40 MG tablet   Commonly known as: PROTONIX   Take 1 tablet (40 mg total) by mouth 2 (two) times daily.      predniSONE 10 MG tablet   Commonly known as: DELTASONE   4 tablets daily for 2 days, then decrease by 1 tablet every 2 days until off      Rivaroxaban 20 MG Tabs   Commonly known as: XARELTO   Take 1 tablet (20 mg total) by mouth daily with supper. Start after completion of 15 mg tablets (in 3 weeks)      Rivaroxaban 15 MG Tabs tablet   Commonly known as: XARELTO   Take 1 tablet (15 mg total) by mouth 2 (two) times daily with a meal. Until gone, then start 20 mg daily      tiotropium 18 MCG inhalation capsule   Commonly known as: SPIRIVA   Place 1 capsule (18 mcg total) into inhaler and inhale daily.            Discharge Orders    Future  Appointments: Provider: Department: Dept Phone: Center:   04/09/2012 9:00 AM Julio Sicks, NP [facility] [phone] None     Future Orders Please Complete By Expires   Diet general      Increase activity slowly      Discharge instructions      Comments:   Quit smoking      Follow-up Information    Follow up with Sandrea Hughs, MD. On 04/09/2012. (9:00 am (hospital follow-up with Bronwen Betters, [facility]))    Contact information:   [address] [place] [address] [phone]          Disposition: 01-Home or Self Care  Discharged Condition: stable  Consults:  none  Labs:   Results for orders placed during the hospital encounter of 03/22/12 (from the past 48 hour(s))  PROTIME-INR     Status: Normal   Collection Time   03/31/12  2:25 PM      Component Value Range Comment   Prothrombin Time 11.6  11.6 - 15.2 seconds    INR 0.85  0.00 - 1.49  PROTIME-INR     Status: Normal   Collection Time   04/01/12  8:53 AM      Component Value Range Comment   Prothrombin Time 12.2  11.6 - 15.2 seconds    INR 0.91  0.00 - 1.49   CBC     Status: Abnormal   Collection Time   04/01/12  8:53 AM      Component Value Range Comment   WBC 4.5  4.0 - 10.5 K/uL    RBC 3.94 (*) 4.22 - 5.81 MIL/uL    Hemoglobin 12.5 (*) 13.0 - 17.0 g/dL    HCT 96.0 (*) 45.4 - 52.0 %    MCV 90.1  78.0 - 100.0 fL    MCH 31.7  26.0 - 34.0 pg    MCHC 35.2  30.0 - 36.0 g/dL    RDW 09.8  11.9 - 14.7 %    Platelets    150 - 400 K/uL    Value: PLATELET CLUMPS NOTED ON SMEAR, COUNT APPEARS DECREASED  PRO B NATRIURETIC PEPTIDE     Status: Abnormal   Collection Time   04/01/12  8:53 AM      Component Value Range Comment   Pro B Natriuretic peptide (BNP) 337.3 (*) 0 - 125 pg/mL     Diagnostics:  Dg Chest 2 View  04/01/2012  *RADIOLOGY REPORT*  Clinical Data: Cough and dyspnea.  CHEST - 2 VIEW  Comparison: 03/22/2012  Findings: Minimal scarring/atelectasis present at both lung  bases. No evidence of edema, consolidation, nodule or pleural fluid. Cardiac and mediastinal contours are within normal limits.  The bony thorax shows no significant abnormalities.  IMPRESSION: Mild bibasilar scarring/atelectasis.  No active disease.   Original Report Authenticated By: Irish Lack, M.D.    Dg Chest 2 View  03/22/2012  *RADIOLOGY REPORT*  Clinical Data: Short of breath  CHEST - 2 VIEW  Comparison: 02/10/2012  Findings: Heart size is normal.  No pleural effusion or edema.  No airspace consolidation.  Chronic bronchitic changes are noted bilaterally.  Visualized osseous structures are unremarkable.  IMPRESSION:  1.  Chronic bronchitic changes. 2.  No pneumonia.   Original Report Authenticated By: Signa Kell, M.D.    Ct Maxillofacial  Ltd Wo Cm  03/31/2012  *RADIOLOGY REPORT*  Clinical Data:  Cough.  CT PARANASAL SINUS LIMITED WITHOUT CONTRAST  Technique:  Multidetector CT images of the paranasal sinuses were obtained in a single plane without contrast.  Comparison:  06/06/P 1009  Findings:  Mild polypoid mucoperiosteal thickening noted in the left maxillary sinus.  Mild chronic right maxillary mucoperiosteal thickening and minimal mucosal thickening in the ethmoid air cells. Bilateral middle turbinate concha bullosa.  Mild chronic sphenoid sinusitis is observed.  Mastoid air cells clear.  Middle ears clear.  IMPRESSION:  1.  Mild chronic maxillary, ethmoid, and sphenoid sinusitis.   Original Report Authenticated By: Gaylyn Rong, M.D.     Hospital Course: see h&p for complete admission details.  The patient is a 52 y.o. Male who presented with fever, cough, wheezing.  He has a history of asthma.  CXR showed no infiltrate.  He was found to have influenza A, H1N1 with COPD exacerbation.  He was admitted, started on tamiflu, bronchodilators, steroids.  He was slow to improve.  Also diagnosed with DVT.  Started on Xarelto.  Encouraged to quit smoking.  Discharge Exam:  Blood pressure  143/84, pulse 79, temperature 98.5 F (36.9 C), temperature source Oral, resp. rate 18, height 6\' 2"  (1.88  m), weight 90.901 kg (200 lb 6.4 oz), SpO2 95.00%.  LUNGS CTA CV RRR ABD S, NT, ND EXT NO CCE  Signed: Janaia Kozel L 04/02/2012, 11:46 AM

## 2012-04-02 NOTE — Progress Notes (Signed)
Patient was discharged home with discharge instructions and prescriptions. Patient was told to call doctor with questions and concerns or go to the hospital in case of an emergency. Patient was stable upon discharge.

## 2012-04-09 ENCOUNTER — Inpatient Hospital Stay: Payer: Medicaid Other | Admitting: Adult Health

## 2012-05-27 ENCOUNTER — Emergency Department (HOSPITAL_COMMUNITY): Payer: Medicaid Other

## 2012-05-27 ENCOUNTER — Encounter (HOSPITAL_COMMUNITY): Payer: Self-pay | Admitting: Radiology

## 2012-05-27 ENCOUNTER — Emergency Department (HOSPITAL_COMMUNITY)
Admission: EM | Admit: 2012-05-27 | Discharge: 2012-05-27 | Disposition: A | Discharge status: Home / Self Care | Payer: Medicaid Other | Attending: Emergency Medicine | Admitting: Emergency Medicine

## 2012-05-27 DIAGNOSIS — R55 Syncope and collapse: Secondary | ICD-10-CM | POA: Insufficient documentation

## 2012-05-27 DIAGNOSIS — Z86718 Personal history of other venous thrombosis and embolism: Secondary | ICD-10-CM | POA: Insufficient documentation

## 2012-05-27 DIAGNOSIS — Z86711 Personal history of pulmonary embolism: Secondary | ICD-10-CM | POA: Insufficient documentation

## 2012-05-27 DIAGNOSIS — F17200 Nicotine dependence, unspecified, uncomplicated: Secondary | ICD-10-CM | POA: Insufficient documentation

## 2012-05-27 DIAGNOSIS — J45909 Unspecified asthma, uncomplicated: Secondary | ICD-10-CM | POA: Insufficient documentation

## 2012-05-27 DIAGNOSIS — Z79899 Other long term (current) drug therapy: Secondary | ICD-10-CM | POA: Insufficient documentation

## 2012-05-27 LAB — CBC WITH DIFFERENTIAL/PLATELET
Basophils Absolute: 0 10*3/uL (ref 0.0–0.1)
Basophils Relative: 0 % (ref 0–1)
Eosinophils Absolute: 0.2 10*3/uL (ref 0.0–0.7)
Eosinophils Relative: 4 % (ref 0–5)
HCT: 39.2 % (ref 39.0–52.0)
Hemoglobin: 13.7 g/dL (ref 13.0–17.0)
Lymphocytes Relative: 27 % (ref 12–46)
Lymphs Abs: 1.2 10*3/uL (ref 0.7–4.0)
MCH: 31.6 pg (ref 26.0–34.0)
MCHC: 34.9 g/dL (ref 30.0–36.0)
MCV: 90.3 fL (ref 78.0–100.0)
Monocytes Absolute: 0.6 10*3/uL (ref 0.1–1.0)
Monocytes Relative: 13 % — ABNORMAL HIGH (ref 3–12)
Neutro Abs: 2.5 10*3/uL (ref 1.7–7.7)
Neutrophils Relative %: 56 % (ref 43–77)
Platelets: 127 10*3/uL — ABNORMAL LOW (ref 150–400)
RBC: 4.34 MIL/uL (ref 4.22–5.81)
RDW: 13.8 % (ref 11.5–15.5)
WBC: 4.5 10*3/uL (ref 4.0–10.5)

## 2012-05-27 LAB — BASIC METABOLIC PANEL
BUN: 11 mg/dL (ref 6–23)
CO2: 22 mEq/L (ref 19–32)
Calcium: 9.6 mg/dL (ref 8.4–10.5)
Chloride: 106 mEq/L (ref 96–112)
Creatinine, Ser: 0.68 mg/dL (ref 0.50–1.35)
GFR calc Af Amer: >90 mL/min (ref >90)
GFR calc non Af Amer: >90 mL/min (ref >90)
Glucose, Bld: 94 mg/dL (ref 70–99)
Potassium: 3.7 mEq/L (ref 3.5–5.1)
Sodium: 141 mEq/L (ref 135–145)

## 2012-05-27 LAB — POCT I-STAT TROPONIN I: Troponin i, poc: 0.01 ng/mL (ref 0.00–0.08)

## 2012-05-27 MED ORDER — SODIUM CHLORIDE 0.9 % IV BOLUS (SEPSIS)
1000.0000 mL | Freq: Once | INTRAVENOUS | Status: AC
  Administered 2012-05-27: 1000 mL via INTRAVENOUS

## 2012-05-27 MED ORDER — ALBUTEROL SULFATE HFA 108 (90 BASE) MCG/ACT IN AERS
2.0000 | INHALATION_SPRAY | Freq: Once | RESPIRATORY_TRACT | Status: DC
  Filled 2012-05-27: qty 6.7

## 2012-05-27 MED ORDER — AZITHROMYCIN 250 MG PO TABS: 250.0000 mg | ORAL_TABLET | Freq: Every day | ORAL | Status: DC

## 2012-05-27 MED ORDER — IOHEXOL 350 MG/ML SOLN
100.0000 mL | Freq: Once | INTRAVENOUS | Status: AC | PRN
  Administered 2012-05-27: 60 mL via INTRAVENOUS

## 2012-05-27 MED ORDER — PREDNISONE 20 MG PO TABS: 40.0000 mg | ORAL_TABLET | Freq: Every day | ORAL | Status: DC

## 2012-05-27 NOTE — ED Notes (Signed)
Patient arrived via [facility] post syncopal episode. Patient had complaints of chest pain enroute to Hospital 6/10. Patient received NTG SL x1 and Asprin 324mg  per EMS. EKG NSR. Patient states he thinks he hit his head nothing showing.

## 2012-05-27 NOTE — ED Provider Notes (Signed)
Pt care assumed at shift change from C. Lawyer, PA-C. Patient's vitals have remained stable and he is in NAD, well appearing, and nontoxic. CT angio chest significant for focal mucoid impaction in the lobe of the left upper lung. No evidence of PE. All other imaging negative. Findings consistent with possible bronchitis. Patient to d/c with azithromycin, prednisone, and an albuterol inhaler as well as PCP and pulmonology follow up. Patient verbalizes comfort and understanding with this plan. Patient discussed with Dr. Effie Shy who is in agreement.  Results for orders placed during the hospital encounter of 05/27/12  BASIC METABOLIC PANEL      Result Value Range   Sodium 141  135 - 145 mEq/L   Potassium 3.7  3.5 - 5.1 mEq/L   Chloride 106  96 - 112 mEq/L   CO2 22  19 - 32 mEq/L   Glucose, Bld 94  70 - 99 mg/dL   BUN 11  6 - 23 mg/dL   Creatinine, Ser 9.81  0.50 - 1.35 mg/dL   Calcium 9.6  8.4 - 19.1 mg/dL   GFR calc non Af Amer >90  >90 mL/min   GFR calc Af Amer >90  >90 mL/min  CBC WITH DIFFERENTIAL      Result Value Range   WBC 4.5  4.0 - 10.5 K/uL   RBC 4.34  4.22 - 5.81 MIL/uL   Hemoglobin 13.7  13.0 - 17.0 g/dL   HCT 47.8  29.5 - 62.1 %   MCV 90.3  78.0 - 100.0 fL   MCH 31.6  26.0 - 34.0 pg   MCHC 34.9  30.0 - 36.0 g/dL   RDW 30.8  65.7 - 84.6 %   Platelets 127 (*) 150 - 400 K/uL   Neutrophils Relative 56  43 - 77 %   Neutro Abs 2.5  1.7 - 7.7 K/uL   Lymphocytes Relative 27  12 - 46 %   Lymphs Abs 1.2  0.7 - 4.0 K/uL   Monocytes Relative 13 (*) 3 - 12 %   Monocytes Absolute 0.6  0.1 - 1.0 K/uL   Eosinophils Relative 4  0 - 5 %   Eosinophils Absolute 0.2  0.0 - 0.7 K/uL   Basophils Relative 0  0 - 1 %   Basophils Absolute 0.0  0.0 - 0.1 K/uL  POCT I-STAT TROPONIN I      Result Value Range   Troponin i, poc 0.01  0.00 - 0.08 ng/mL   Comment 3            Ct Head Wo Contrast  05/27/2012  *RADIOLOGY REPORT*  Clinical Data:  Larey Seat and hit head.  Headache.  CT HEAD WITHOUT CONTRAST  CT CERVICAL SPINE WITHOUT CONTRAST  Technique:  Multidetector CT imaging of the head and cervical spine was performed following the standard protocol without intravenous contrast.  Multiplanar CT image reconstructions of the cervical spine were also generated.  Comparison:  CT head 08/31/2007  CT HEAD  Findings: Ventricle size is normal.  Negative for intracranial hemorrhage.  Mild chronic microvascular ischemia in the white matter.  No acute infarct or mass lesion.  No shift of the midline structures.  Negative for skull fracture.  IMPRESSION: No acute intracranial abnormality.  CT CERVICAL SPINE  Findings: Normal alignment and no fracture.  No focal bony mass lesion.  Central disc protrusions at C3-4 and C4-5 causing mild spinal stenosis.  IMPRESSION: Negative for fracture.   Original Report Authenticated By: Janeece Riggers, M.D.  Ct Angio Chest Pe W/cm &/or Wo Cm  05/28/2012  **ADDENDUM** CREATED: 05/28/2012 08:44:12  The impression should read the following,  "No evidence of acute pulmonary thromboembolism.  Mucoid impaction in the left upper lobe as described."  **END ADDENDUM** SIGNED BY: Marlowe Aschoff. Hoss, M.D.   05/27/2012  *RADIOLOGY REPORT*  Clinical Data: Syncopal episode  CT ANGIOGRAPHY CHEST  Technique:  Multidetector CT imaging of the chest using the standard protocol during bolus administration of intravenous contrast. Multiplanar reconstructed images including MIPs were obtained and reviewed to evaluate the vascular anatomy.  Contrast: 60mL OMNIPAQUE IOHEXOL 350 MG/ML SOLN  Comparison: 02/10/2012  Findings: There are no filling defects in the pulmonary arterial tree to suggest acute pulmonary thromboembolism.  Minimal retained secretions are present in the posterior trachea at the thoracic inlet.  No obvious aortic dissection.  Atherosclerotic calcifications are present in the right coronary artery, circumflex coronary artery, and left anterior descending coronary.  No pneumothorax.  No pleural effusion.   Dependent atelectasis bilaterally is present.  There is mucoid impaction within the segmental airways of the apico-posterior segment of the left upper lobe.  Mild bronchial wall thickening is seen throughout the lungs.  No destructive bone lesion.  Upper abdomen is benign.  IMPRESSION: No evidence of acute pulmonary thromboembolism.  Fecal impaction in the left upper lobe as described.   Original Report Authenticated By: Jolaine Click, M.D.    Ct Cervical Spine Wo Contrast  05/27/2012  *RADIOLOGY REPORT*  Clinical Data:  Larey Seat and hit head.  Headache.  CT HEAD WITHOUT CONTRAST CT CERVICAL SPINE WITHOUT CONTRAST  Technique:  Multidetector CT imaging of the head and cervical spine was performed following the standard protocol without intravenous contrast.  Multiplanar CT image reconstructions of the cervical spine were also generated.  Comparison:  CT head 08/31/2007  CT HEAD  Findings: Ventricle size is normal.  Negative for intracranial hemorrhage.  Mild chronic microvascular ischemia in the white matter.  No acute infarct or mass lesion.  No shift of the midline structures.  Negative for skull fracture.  IMPRESSION: No acute intracranial abnormality.  CT CERVICAL SPINE  Findings: Normal alignment and no fracture.  No focal bony mass lesion.  Central disc protrusions at C3-4 and C4-5 causing mild spinal stenosis.  IMPRESSION: Negative for fracture.   Original Report Authenticated By: Janeece Riggers, M.D.       Antony Madura, PA-C 05/28/12 [phone]

## 2012-05-27 NOTE — ED Provider Notes (Signed)
History     CSN: [id number]  Arrival date & time 05/27/12  1157   First MD Initiated Contact with Patient 05/27/12 1208      Chief Complaint  Patient presents with  . Loss of Consciousness    (Consider location/radiation/quality/duration/timing/severity/associated sxs/prior treatment) HPI  patient presents to the emergency department following a syncopal event.patient states, that it happened, just prior to arrival. patient states, that he was standing in line at the bank and  was very long. Patient states she's had syncope in the past following this type of issue. patient states he's had chest pain, chronically for quite a while. patient states, that he does not have any shortness of breath, nausea, vomiting, weakness, headache, blurred vision, fever, cough, back pain, or numbness. patient states, that he has had this in the past as well. Past Medical History  Diagnosis Date  . Asthma   . DVT (deep venous thrombosis)   . Pulmonary embolism     No past surgical history on file.  No family history on file.  History  Substance Use Topics  . Smoking status: Current Some Day Smoker -- 0.20 packs/day  . Smokeless tobacco: Not on file  . Alcohol Use: Yes     Comment: occasionally      Review of Systems All other systems negative except as documented in the HPI. All pertinent positives and negatives as reviewed in the HPI.  Allergies  Review of patient's allergies indicates no known allergies.  Home Medications   Current Outpatient Rx  Name  Route  Sig  Dispense  Refill  . budesonide-formoterol (SYMBICORT) 160-4.5 MCG/ACT inhaler   Inhalation   Inhale 2 puffs into the lungs 2 (two) times daily.   1 Inhaler   0   . Fluticasone-Salmeterol (ADVAIR DISKUS) 250-50 MCG/DOSE AEPB   Inhalation   Inhale 1 puff into the lungs 2 (two) times daily.   60 each   1   . Ipratropium-Albuterol (COMBIVENT RESPIMAT) 20-100 MCG/ACT AERS respimat   Inhalation   Inhale 1 puff into the  lungs every 6 (six) hours as needed for wheezing or shortness of breath.   1 Inhaler   1   . tiotropium (SPIRIVA) 18 MCG inhalation capsule   Inhalation   Place 1 capsule (18 mcg total) into inhaler and inhale daily.   30 capsule   0     BP 126/86  Pulse 73  Temp(Src) 98.2 F (36.8 C) (Oral)  Resp 13  SpO2 98%  Physical Exam  Nursing note and vitals reviewed. Constitutional: He is oriented to person, place, and time. He appears well-developed and well-nourished. No distress.  HENT:  Head: Normocephalic.  Mouth/Throat: Oropharynx is clear and moist. No oropharyngeal exudate.  Eyes: Pupils are equal, round, and reactive to light.  Neck: Normal range of motion. Neck supple.  Cardiovascular: Normal rate, regular rhythm and normal heart sounds.  Exam reveals no gallop and no friction rub.   No murmur heard. Pulmonary/Chest: Effort normal and breath sounds normal. No respiratory distress.  Abdominal: Soft. Bowel sounds are normal. There is no tenderness.  Neurological: He is alert and oriented to person, place, and time. He exhibits normal muscle tone. Coordination normal.  Skin: Skin is warm and dry. No rash noted.    ED Course  Procedures (including critical care time)  Labs Reviewed  CBC WITH DIFFERENTIAL - Abnormal; Notable for the following:    Platelets 127 (*)    Monocytes Relative 13 (*)    All  other components within normal limits  BASIC METABOLIC PANEL  URINALYSIS, ROUTINE W REFLEX MICROSCOPIC  POCT I-STAT TROPONIN I  with a history of the patient having syncope in the past.  We will assess for PE and other causes, but this seems less likely to be the case at this point due to his previous history   MDM  MDM Reviewed: vitals and nursing note Interpretation: labs, ECG and CT scan   Date: 05/27/2012  Rate: 99  Rhythm: normal sinus rhythm  QRS Axis: normal  Intervals: normal  ST/T Wave abnormalities: normal  Conduction Disutrbances:none  Narrative  Interpretation:   Old EKG Reviewed: unchanged            Carlyle Dolly, PA-C 05/27/12 1614

## 2012-05-27 NOTE — ED Notes (Signed)
Pt stated he can not urinate at this time for urinalysis

## 2012-05-28 ENCOUNTER — Ambulatory Visit (INDEPENDENT_AMBULATORY_CARE_PROVIDER_SITE_OTHER): Payer: Medicaid Other | Admitting: Pulmonary Disease

## 2012-05-28 ENCOUNTER — Encounter: Payer: Self-pay | Admitting: Pulmonary Disease

## 2012-05-28 VITALS — BP 118/80 | HR 112 | Temp 96.8°F | Ht 74.0 in | Wt 185.2 lb

## 2012-05-28 DIAGNOSIS — IMO0002 Reserved for concepts with insufficient information to code with codable children: Secondary | ICD-10-CM

## 2012-05-28 DIAGNOSIS — J449 Chronic obstructive pulmonary disease, unspecified: Secondary | ICD-10-CM

## 2012-05-28 NOTE — Patient Instructions (Signed)
You have to quit smoking Complete course of prednisone Stay on blood thinner  XARELTO - pl call us back & confirm medication & dose

## 2012-05-28 NOTE — Progress Notes (Signed)
  Subjective:    Patient ID: [name], male    DOB: 13-Jul-1960, 52 y.o.   MRN: [id number]  HPI PCP - Lerry Liner  52 y.o. Smoker referred from the ER for dyspnea. He reports a history of asthma for many yrs. He reports chronic bilateral DVTs x 20 yrs on & off anticoagulation. He smokes about a pack every 2-4 days x 30 yrs.  He was admitted 11/13 for 'COPD flare' & again in  1/14 for influenza A, H1N1 with COPD exacerbation. He was treated with tamiflu, bronchodilators, steroids. Duplex showed subacute DVT involving the right popliteal vein, left popliteal vein and left saphenofemoral junction. &chronic deep vein thrombosis involving the right femoral vein and left femoral vein. Dopplers in 11/13 & as far back as 9/08 also show DVTs. Started on Xarelto _ he has been on anticoagulation on & off.  Echo 11/13 showed nml LVEF & no valvular issues, RVSP nml. 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 Asthma   . DVT (deep venous thrombosis)   . Pulmonary embolism     No past surgical history on file.  No Known Allergies History   Social History  . Marital Status: Single    Spouse Name: N/A    Number of Children: N/A  . Years of Education: N/A   Occupational History  . unemployed    Social History Main Topics  . Smoking status: Current Some Day Smoker -- 0.20 packs/day for 30 years  . Smokeless tobacco: Not on file  . Alcohol Use: Yes     Comment: occasionally on football days  . Drug Use: No  . Sexually Active: Not on file   Other Topics Concern  . Not on file   Social History Narrative  . No narrative on file     Review of Systems  Constitutional: Positive for unexpected weight change. Negative for appetite change.   HENT: Positive for sneezing. Negative for ear pain, congestion, sore throat, trouble swallowing and dental problem.   Respiratory: Positive for cough and shortness of breath.   Cardiovascular: Positive for chest pain. Negative for palpitations and leg swelling.  Gastrointestinal: Negative for abdominal pain.  Musculoskeletal: Negative for joint swelling.  Skin: Negative for rash.  Neurological: Positive for headaches.  Psychiatric/Behavioral: Negative for dysphoric mood. The patient is not nervous/anxious.        Objective:   Physical Exam  Gen. Pleasant, well-nourished, in no distress, normal affect ENT - no lesions, no post nasal drip Neck: No JVD, no thyromegaly, no carotid bruits Lungs: no use of accessory muscles, no dullness to percussion, decreased without rales or rhonchi  Cardiovascular: Rhythm regular, heart sounds  normal, no murmurs or gallops, no peripheral edema Abdomen: soft and non-tender, no hepatosplenomegaly, BS normal. Musculoskeletal: No deformities, no cyanosis or clubbing Neuro:  alert, non focal       Assessment & Plan:

## 2012-05-28 NOTE — Assessment & Plan Note (Signed)
Unclear etio, out of proportion to airway obstruction No acute PE on CT angio, no PH on echo Obtian bubble study to r/o PFO/ ASD

## 2012-05-28 NOTE — ED Provider Notes (Signed)
Medical screening examination/treatment/procedure(s) were performed by non-physician practitioner and as supervising physician I was immediately available for consultation/collaboration.   Anaaya Fuster L Jodeci Rini, MD 05/28/12 2256 

## 2012-05-28 NOTE — Assessment & Plan Note (Addendum)
Ct symbicort Dc spiriva - no COPD Smoking cessation paramount Short course of prednisone ok

## 2012-05-28 NOTE — Assessment & Plan Note (Signed)
Ct xarelto prob lifelong

## 2012-05-30 NOTE — ED Provider Notes (Signed)
Medical screening examination/treatment/procedure(s) were performed by non-physician practitioner and as supervising physician I was immediately available for consultation/collaboration.   David H Yao, MD 05/30/12 0859 

## 2012-06-05 ENCOUNTER — Other Ambulatory Visit (HOSPITAL_COMMUNITY): Payer: Medicaid Other

## 2012-08-08 ENCOUNTER — Inpatient Hospital Stay (HOSPITAL_COMMUNITY)
Admission: EM | Admit: 2012-08-08 | Discharge: 2012-08-11 | DRG: 301 | Disposition: A | Discharge status: Home / Self Care | Payer: Medicaid Other | Attending: Internal Medicine | Admitting: Internal Medicine

## 2012-08-08 ENCOUNTER — Emergency Department (HOSPITAL_COMMUNITY): Payer: Medicaid Other

## 2012-08-08 ENCOUNTER — Encounter (HOSPITAL_COMMUNITY): Payer: Self-pay | Admitting: Emergency Medicine

## 2012-08-08 DIAGNOSIS — I2510 Atherosclerotic heart disease of native coronary artery without angina pectoris: Secondary | ICD-10-CM | POA: Diagnosis present

## 2012-08-08 DIAGNOSIS — Z86711 Personal history of pulmonary embolism: Secondary | ICD-10-CM

## 2012-08-08 DIAGNOSIS — I509 Heart failure, unspecified: Secondary | ICD-10-CM

## 2012-08-08 DIAGNOSIS — Z79899 Other long term (current) drug therapy: Secondary | ICD-10-CM

## 2012-08-08 DIAGNOSIS — I82401 Acute embolism and thrombosis of unspecified deep veins of right lower extremity: Secondary | ICD-10-CM

## 2012-08-08 DIAGNOSIS — R109 Unspecified abdominal pain: Secondary | ICD-10-CM

## 2012-08-08 DIAGNOSIS — I82509 Chronic embolism and thrombosis of unspecified deep veins of unspecified lower extremity: Secondary | ICD-10-CM | POA: Diagnosis present

## 2012-08-08 DIAGNOSIS — R0902 Hypoxemia: Secondary | ICD-10-CM

## 2012-08-08 DIAGNOSIS — J4489 Other specified chronic obstructive pulmonary disease: Secondary | ICD-10-CM | POA: Diagnosis present

## 2012-08-08 DIAGNOSIS — D696 Thrombocytopenia, unspecified: Secondary | ICD-10-CM | POA: Diagnosis present

## 2012-08-08 DIAGNOSIS — R0602 Shortness of breath: Secondary | ICD-10-CM

## 2012-08-08 DIAGNOSIS — L209 Atopic dermatitis, unspecified: Secondary | ICD-10-CM

## 2012-08-08 DIAGNOSIS — IMO0002 Reserved for concepts with insufficient information to code with codable children: Secondary | ICD-10-CM

## 2012-08-08 DIAGNOSIS — Z91199 Patient's noncompliance with other medical treatment and regimen due to unspecified reason: Secondary | ICD-10-CM

## 2012-08-08 DIAGNOSIS — I82409 Acute embolism and thrombosis of unspecified deep veins of unspecified lower extremity: Principal | ICD-10-CM | POA: Diagnosis present

## 2012-08-08 DIAGNOSIS — J449 Chronic obstructive pulmonary disease, unspecified: Secondary | ICD-10-CM | POA: Diagnosis present

## 2012-08-08 DIAGNOSIS — I82403 Acute embolism and thrombosis of unspecified deep veins of lower extremity, bilateral: Secondary | ICD-10-CM

## 2012-08-08 DIAGNOSIS — J45901 Unspecified asthma with (acute) exacerbation: Secondary | ICD-10-CM | POA: Diagnosis present

## 2012-08-08 DIAGNOSIS — D72819 Decreased white blood cell count, unspecified: Secondary | ICD-10-CM

## 2012-08-08 DIAGNOSIS — F17200 Nicotine dependence, unspecified, uncomplicated: Secondary | ICD-10-CM | POA: Diagnosis present

## 2012-08-08 DIAGNOSIS — Z9119 Patient's noncompliance with other medical treatment and regimen: Secondary | ICD-10-CM

## 2012-08-08 DIAGNOSIS — Z720 Tobacco use: Secondary | ICD-10-CM | POA: Diagnosis present

## 2012-08-08 DIAGNOSIS — R21 Rash and other nonspecific skin eruption: Secondary | ICD-10-CM

## 2012-08-08 LAB — CBC WITH DIFFERENTIAL/PLATELET
Basophils Absolute: 0 10*3/uL (ref 0.0–0.1)
HCT: 37.5 % — ABNORMAL LOW (ref 39.0–52.0)
Hemoglobin: 12.8 g/dL — ABNORMAL LOW (ref 13.0–17.0)
Lymphocytes Relative: 20 % (ref 12–46)
Lymphs Abs: 0.9 10*3/uL (ref 0.7–4.0)
MCV: 91.2 fL (ref 78.0–100.0)
Monocytes Absolute: 0.7 10*3/uL (ref 0.1–1.0)
Monocytes Relative: 16 % — ABNORMAL HIGH (ref 3–12)
Neutro Abs: 2.8 10*3/uL (ref 1.7–7.7)
RBC: 4.11 MIL/uL — ABNORMAL LOW (ref 4.22–5.81)
WBC: 4.7 10*3/uL (ref 4.0–10.5)

## 2012-08-08 LAB — BASIC METABOLIC PANEL
BUN: 14 mg/dL (ref 6–23)
CO2: 24 mEq/L (ref 19–32)
Chloride: 102 mEq/L (ref 96–112)
Creatinine, Ser: 0.77 mg/dL (ref 0.50–1.35)
Glucose, Bld: 88 mg/dL (ref 70–99)

## 2012-08-08 LAB — PROTIME-INR: Prothrombin Time: 13.6 seconds (ref 11.6–15.2)

## 2012-08-08 MED ORDER — SODIUM CHLORIDE 0.9 % IJ SOLN
3.0000 mL | INTRAMUSCULAR | Status: DC | PRN
  Administered 2012-08-11: 3 mL via INTRAVENOUS

## 2012-08-08 MED ORDER — ONDANSETRON HCL 4 MG/2ML IJ SOLN
4.0000 mg | Freq: Once | INTRAMUSCULAR | Status: AC
  Administered 2012-08-08: 4 mg via INTRAVENOUS
  Filled 2012-08-08: qty 2

## 2012-08-08 MED ORDER — HYDROMORPHONE HCL PF 1 MG/ML IJ SOLN
1.0000 mg | Freq: Once | INTRAMUSCULAR | Status: AC
  Administered 2012-08-08: 1 mg via INTRAVENOUS
  Filled 2012-08-08: qty 1

## 2012-08-08 MED ORDER — MOMETASONE FURO-FORMOTEROL FUM 100-5 MCG/ACT IN AERO
2.0000 | INHALATION_SPRAY | Freq: Two times a day (BID) | RESPIRATORY_TRACT | Status: DC
  Filled 2012-08-08: qty 8.8

## 2012-08-08 MED ORDER — IOHEXOL 350 MG/ML SOLN
100.0000 mL | Freq: Once | INTRAVENOUS | Status: AC | PRN
  Administered 2012-08-08: 100 mL via INTRAVENOUS

## 2012-08-08 MED ORDER — SODIUM CHLORIDE 0.9 % IJ SOLN
3.0000 mL | Freq: Two times a day (BID) | INTRAMUSCULAR | Status: DC
  Administered 2012-08-10: 3 mL via INTRAVENOUS

## 2012-08-08 MED ORDER — IPRATROPIUM-ALBUTEROL 20-100 MCG/ACT IN AERS
1.0000 | INHALATION_SPRAY | Freq: Four times a day (QID) | RESPIRATORY_TRACT | Status: DC | PRN
Start: 2012-08-08 — End: 2012-08-11
  Filled 2012-08-08: qty 4

## 2012-08-08 MED ORDER — ONDANSETRON HCL 4 MG PO TABS: 4.0000 mg | ORAL_TABLET | Freq: Four times a day (QID) | ORAL | Status: DC | PRN

## 2012-08-08 MED ORDER — MOMETASONE FURO-FORMOTEROL FUM 100-5 MCG/ACT IN AERO
2.0000 | INHALATION_SPRAY | Freq: Two times a day (BID) | RESPIRATORY_TRACT | Status: DC
  Administered 2012-08-08 – 2012-08-11 (×6): 2 via RESPIRATORY_TRACT
  Filled 2012-08-08: qty 8.8

## 2012-08-08 MED ORDER — ALBUTEROL SULFATE (5 MG/ML) 0.5% IN NEBU: 2.5000 mg | INHALATION_SOLUTION | RESPIRATORY_TRACT | Status: DC | PRN

## 2012-08-08 MED ORDER — SODIUM CHLORIDE 0.9 % IV SOLN
INTRAVENOUS | Status: DC
  Administered 2012-08-08: 10 mL/h via INTRAVENOUS

## 2012-08-08 MED ORDER — HYDROCODONE-ACETAMINOPHEN 5-325 MG PO TABS
1.0000 | ORAL_TABLET | ORAL | Status: DC | PRN
  Administered 2012-08-08 – 2012-08-11 (×11): 2 via ORAL
  Filled 2012-08-08 (×11): qty 2

## 2012-08-08 MED ORDER — ENOXAPARIN SODIUM 80 MG/0.8ML ~~LOC~~ SOLN
1.0000 mg/kg | Freq: Two times a day (BID) | SUBCUTANEOUS | Status: DC
  Administered 2012-08-09: 80 mg via SUBCUTANEOUS
  Filled 2012-08-08 (×3): qty 0.8

## 2012-08-08 MED ORDER — ASPIRIN 325 MG PO TABS
325.0000 mg | ORAL_TABLET | ORAL | Status: DC
  Administered 2012-08-08 – 2012-08-10 (×2): 325 mg via ORAL
  Filled 2012-08-08 (×2): qty 1

## 2012-08-08 MED ORDER — HYDROMORPHONE HCL PF 1 MG/ML IJ SOLN
1.0000 mg | INTRAMUSCULAR | Status: DC | PRN
  Administered 2012-08-08 – 2012-08-10 (×10): 2 mg via INTRAVENOUS
  Filled 2012-08-08 (×10): qty 2

## 2012-08-08 MED ORDER — ENOXAPARIN SODIUM 80 MG/0.8ML ~~LOC~~ SOLN
1.0000 mg/kg | Freq: Once | SUBCUTANEOUS | Status: AC
  Administered 2012-08-08: 80 mg via SUBCUTANEOUS
  Filled 2012-08-08: qty 0.8

## 2012-08-08 MED ORDER — SODIUM CHLORIDE 0.9 % IV SOLN: 250.0000 mL | INTRAVENOUS | Status: DC | PRN

## 2012-08-08 MED ORDER — ONDANSETRON HCL 4 MG/2ML IJ SOLN: 4.0000 mg | Freq: Four times a day (QID) | INTRAMUSCULAR | Status: DC | PRN

## 2012-08-08 MED ORDER — BUDESONIDE-FORMOTEROL FUMARATE 160-4.5 MCG/ACT IN AERO
2.0000 | INHALATION_SPRAY | Freq: Every day | RESPIRATORY_TRACT | Status: DC | PRN
Start: 2012-08-08 — End: 2012-08-08

## 2012-08-08 NOTE — ED Notes (Signed)
Pt c/o right groin pain with swelling for the past week and SOB with exertion for the past two weeks.  Pt has hx of 15 blood clots.  Stopped taking Coumadin one month ago. Denies chest pain.

## 2012-08-08 NOTE — ED Notes (Signed)
[id number]> Expected date:<BR> Expected time:<BR> Means of arrival:<BR> Comments:<BR> ems- elderly, possibly blood clot, hx of

## 2012-08-08 NOTE — ED Provider Notes (Signed)
History     CSN: [id number]  Arrival date & time 08/08/12  4098   First MD Initiated Contact with Patient 08/08/12 [phone]      Chief Complaint  Patient presents with  . Groin Pain  . Shortness of Breath    (Consider location/radiation/quality/duration/timing/severity/associated sxs/prior treatment) Patient is a 52 y.o. male presenting with groin pain and shortness of breath. The history is provided by the patient.  Groin Pain Associated symptoms include shortness of breath.  Shortness of Breath pt here with sob x 1 week and groin pain worse with exertion--no fever or chills, denies pleuritic chest pain or cough--h/o dvts and PEs and should be on blood thinners but has been non-compliant--RLE pain is described as sharp and constant--no tx used for this pta--was coumadin but stopped taking it--denies syncope  Past Medical History  Diagnosis Date  . Asthma   . DVT (deep venous thrombosis)   . Pulmonary embolism     History reviewed. No pertinent past surgical history.  Family History  Problem Relation Age of Onset  . Asthma Mother   . Allergies Mother   . Allergies Sister   . Allergies Brother     History  Substance Use Topics  . Smoking status: Current Some Day Smoker -- 0.20 packs/day for 30 years  . Smokeless tobacco: Not on file  . Alcohol Use: Yes     Comment: occasionally on football days      Review of Systems  Respiratory: Positive for shortness of breath.   All other systems reviewed and are negative.    Allergies  Review of patient's allergies indicates no known allergies.  Home Medications   Current Outpatient Rx  Name  Route  Sig  Dispense  Refill  . budesonide-formoterol (SYMBICORT) 160-4.5 MCG/ACT inhaler   Inhalation   Inhale 2 puffs into the lungs 2 (two) times daily.   1 Inhaler   0   . Fluticasone-Salmeterol (ADVAIR DISKUS) 250-50 MCG/DOSE AEPB   Inhalation   Inhale 1 puff into the lungs 2 (two) times daily.   60 each   1   .  Ipratropium-Albuterol (COMBIVENT RESPIMAT) 20-100 MCG/ACT AERS respimat   Inhalation   Inhale 1 puff into the lungs every 6 (six) hours as needed for wheezing or shortness of breath.   1 Inhaler   1   . predniSONE (DELTASONE) 20 MG tablet   Oral   Take 10 mg by mouth daily.         Marland Kitchen tiotropium (SPIRIVA) 18 MCG inhalation capsule   Inhalation   Place 1 capsule (18 mcg total) into inhaler and inhale daily.   30 capsule   0     BP 134/90  Pulse 87  Temp(Src) 99.1 F (37.3 C) (Oral)  Resp 18  Ht 6\' 2"  (1.88 m)  Wt 180 lb (81.647 kg)  BMI 23.1 kg/m2  SpO2 100%  Physical Exam  Nursing note and vitals reviewed. Constitutional: He is oriented to person, place, and time. He appears well-developed and well-nourished.  Non-toxic appearance. No distress.  HENT:  Head: Normocephalic and atraumatic.  Eyes: Conjunctivae, EOM and lids are normal. Pupils are equal, round, and reactive to light.  Neck: Normal range of motion. Neck supple. No tracheal deviation present. No mass present.  Cardiovascular: Normal rate, regular rhythm and normal heart sounds.  Exam reveals no gallop.   No murmur heard. Pulmonary/Chest: Effort normal and breath sounds normal. No stridor. No respiratory distress. He has no decreased breath sounds. He  has no wheezes. He has no rhonchi. He has no rales.  Abdominal: Soft. Normal appearance and bowel sounds are normal. He exhibits no distension. There is no tenderness. There is no rebound and no CVA tenderness.  Musculoskeletal: Normal range of motion. He exhibits no edema and no tenderness.       Right hip: He exhibits tenderness. He exhibits normal range of motion.  1 plus RLE edema  Neurological: He is alert and oriented to person, place, and time. He has normal strength. No cranial nerve deficit or sensory deficit. GCS eye subscore is 4. GCS verbal subscore is 5. GCS motor subscore is 6.  Skin: Skin is warm and dry. No abrasion and no rash noted.  Psychiatric:  He has a normal mood and affect. His speech is normal and behavior is normal.    ED Course  Procedures (including critical care time)  Labs Reviewed  CBC WITH DIFFERENTIAL  BASIC METABOLIC PANEL  PROTIME-INR  APTT   No results found.   No diagnosis found.    MDM  Pt to be treated for DVT and admitted        Toy Baker, MD 08/08/12 1128

## 2012-08-08 NOTE — Care Management Note (Addendum)
    Page 1 of 2   08/11/2012     10:52:33 AM   CARE MANAGEMENT NOTE 08/11/2012  Patient:  [name],[name]   Account Number:  [id number]  Date Initiated:  08/08/2012  Documentation initiated by:  Lanier Clam  Subjective/Objective Assessment:   ADMITTED W/SOB.ACUTE PE.HX:DVT'S,PE,CHRONIC L DVT.NOTED NON-COMPLIANCE W/MED REGIMEN.     Action/Plan:   FROM HOME.HAS PCP,PHARMACY.   Anticipated DC Date:  08/11/2012   Anticipated DC Plan:  HOME/SELF CARE      DC Planning Services  CM consult      Choice offered to / List presented to:             Status of service:  Completed, signed off Medicare Important Message given?   (If response is "NO", the following Medicare IM given date fields will be blank) Date Medicare IM given:   Date Additional Medicare IM given:    Discharge Disposition:  HOME/SELF CARE  Per UR Regulation:  Reviewed for med. necessity/level of care/duration of stay  If discussed at Long Length of Stay Meetings, dates discussed:    Comments:  08/11/12 Hasina Kreager RN,BSN NCM [phone] MD WANTED CONFIRMATION ON OTPT PHARMACY WHETHER XARELTO NEED PRIOR AUTH.TC [facility] [address] TEL#[phone] SPOKE TO MEGHAN(PHARMACIST) ABOUT PRIOR AUTH.SHE HAD RECEIVED FAXED SCRIPT OF XARELTO 15MG  PO,& WHEN SHE PUT IT THROUGH THERE WAS NO PRIOR AUTH NEEDED.ALSO INFORMED HER TO PER MD,PLEASE IGNORE THIS SCRIPT.TC TO PATIENT TEL#[phone],& INFORMED HIM THAT NO PRIOR AUTH NEEDED,& HE CAN TAKE SCRIPTS TO THE PHARMACY.HE SAID HE WILL GET THEM FILLED.PRIOR TO THIS OUTCOME: PATIENT HAD STATED THAT HE WAS NOT GOING TO FILL HIS SCRIPTS UNTIL TOMORROW,& WAS ANXIOUS TO DC BEFORE I COULD CALL THE PHARMACY WHICH DIDN'T OPEN UNTIL 10AM.MD/NURSE UPDATED.   08/08/12 Namiko Pritts RN,BSN NCM [phone]

## 2012-08-08 NOTE — H&P (Signed)
[facility] History and Physical  Kelyn Koskela MWU:[id number] DOB: 1960/04/21 DOA: 08/08/2012  Referring physician: ED physician PCP: Jearld Lesch, MD   Chief Complaint: right lower extremity pain   HPI:  52 year old male with history of DVT and PE, was supposed to be on blood thinners but explains he has not been taking any medicines, now presenting to [facility] emergency department with main concern of right lower extremity dull pain, constant, 7/10 in severity, worse with ambulation and with no specific alleviating factors, denies similar events in the past. Patient explains the pain is radiating towards his right knee. He denies fevers and chills, no specific neurological symptoms, no numbness or tingling, no chest pain or shortness of breath, no specific abdominal or urinary concerns.  In emergency department, preliminary findings of vascular ultrasound of the right lower extremity consistent with acute DVT involving the common femoral, femoral, profunda femoral, popliteal, posterior tibial, and peroneal veins. Left = Chronic DVT involving the common femoral, femoral, and posterior tibial veins. Subacute to chronic DVT involving the popliteal vein. [facility] asked to admit for further management of DVT.  Assessment and Plan:  Acute right lower extremity DVT, chronic left lower extremity DVT - Secondary to medical noncompliance, will admit patient to telemetry unit for further management - Will start patient on Lovenox for now, we'll plan on bridging to Coumadin - Medical compliance discussed in detail and I've explained to patient that if Coumadin is considered he also has to demonstrate compliance with outpatient visit -  Provide supportive care with analgesia as needed  COPD/Emphysema  - this appears to be clinically stable medical issue - Continue inhalers per home medical regimen - Patient currently maintaining oxygen saturations at target range, no wheezing on exam noted,  provide oxygen if needed Thrombocytopenia  - Review of records indicate this is chronic in etiology, no active signs of bleeding - Continue Lovenox per pharmacy and monitor CBC daily   Code Status: Full Family Communication: Pt at bedside Disposition Plan: Admit to telemetry floor  Review of Systems:  Constitutional: Negative for fever, chills and malaise/fatigue. Negative for diaphoresis.  HENT: Negative for hearing loss, ear pain, nosebleeds, congestion, sore throat, neck pain, tinnitus and ear discharge.   Eyes: Negative for blurred vision, double vision, photophobia, pain, discharge and redness.  Respiratory: Negative for cough, hemoptysis, sputum production, shortness of breath, wheezing and stridor.   Cardiovascular: Negative for chest pain, palpitations, orthopnea, claudication and leg swelling.  Gastrointestinal: Negative for nausea, vomiting and abdominal pain. Negative for heartburn, constipation, blood in stool and melena.  Genitourinary: Negative for dysuria, urgency, frequency, hematuria and flank pain.  Musculoskeletal: Negative for myalgias, back pain, joint pain and falls.  Skin: Negative for itching and rash.  Neurological: Negative for dizziness and weakness. Negative for tingling, tremors, sensory change, speech change, focal weakness, loss of consciousness and headaches.  Endo/Heme/Allergies: Negative for environmental allergies and polydipsia. Does not bruise/bleed easily.  Psychiatric/Behavioral: Negative for suicidal ideas. The patient is not nervous/anxious.      Past Medical History  Diagnosis Date  . Asthma   . DVT (deep venous thrombosis)   . Pulmonary embolism     History reviewed. No pertinent past surgical history.  Social History:  reports that he has been smoking.  He does not have any smokeless tobacco history on file. He reports that  drinks alcohol. He reports that he does not use illicit drugs.  No Known Allergies  Family History  Problem  Relation Age of Onset  .  Asthma Mother   . Allergies Mother   . Allergies Sister   . Allergies Brother     Prior to Admission medications   Medication Sig Start Date End Date Taking? Authorizing Provider  aspirin 325 MG tablet Take 325 mg by mouth every other day.   Yes Historical Provider, MD  budesonide-formoterol (SYMBICORT) 160-4.5 MCG/ACT inhaler Inhale 2 puffs into the lungs daily as needed. 04/02/12  Yes Christiane Ha, MD  Fluticasone-Salmeterol (ADVAIR) 250-50 MCG/DOSE AEPB Inhale 1 puff into the lungs 2 (two) times daily as needed. 02/14/12  Yes Vassie Loll, MD  Ipratropium-Albuterol (COMBIVENT RESPIMAT) 20-100 MCG/ACT AERS respimat Inhale 1 puff into the lungs every 6 (six) hours as needed for wheezing or shortness of breath. 02/14/12  Yes Vassie Loll, MD    Physical Exam: Filed Vitals:   08/08/12 0849 08/08/12 0927 08/08/12 1057  BP: 134/90 120/96 133/81  Pulse: 87 83 73  Temp: 99.1 F (37.3 C)    TempSrc: Oral    Resp: 18 14   Height: 6\' 2"  (1.88 m)    Weight: 81.647 kg (180 lb)    SpO2: 100% 98% 100%    Physical Exam  Constitutional: Appears well-developed and well-nourished. No distress.  HENT: Normocephalic. External right and left ear normal. Oropharynx is clear and moist.  Eyes: Conjunctivae and EOM are normal. PERRLA, no scleral icterus.  Neck: Normal ROM. Neck supple. No JVD. No tracheal deviation. No thyromegaly.  CVS: RRR, S1/S2 +, no murmurs, no gallops, no carotid bruit.  Pulmonary: Effort and breath sounds normal, no stridor, rhonchi, wheezes, rales.  Abdominal: Soft. BS +,  no distension, tenderness, rebound or guarding.  Musculoskeletal: Normal range of motion. No edema and no tenderness.  Lymphadenopathy: No lymphadenopathy noted, cervical, inguinal. Neuro: Alert. Normal reflexes, muscle tone coordination. No cranial nerve deficit. Skin: Skin is warm and dry. No rash noted. Not diaphoretic. No erythema. No pallor.  Psychiatric: Normal mood  and affect. Behavior, judgment, thought content normal.   Labs on Admission:  Basic Metabolic Panel:  Recent Labs Lab 08/08/12 1025  NA 136  K 3.8  CL 102  CO2 24  GLUCOSE 88  BUN 14  CREATININE 0.77  CALCIUM 9.1   CBC:  Recent Labs Lab 08/08/12 1025  WBC 4.7  NEUTROABS 2.8  HGB 12.8*  HCT 37.5*  MCV 91.2  PLT 112*   Radiological Exams on Admission: Ct Angio Chest Pe W/cm &/or Wo Cm 08/08/2012    1.  Smooth airway thickening, favoring bronchitis or reactive airways disease, with chronic mild left upper lobe airway plugging - most likely from chronic mucoid impaction rather than tumor.  2.  No embolus observed.  3.  Coronary artery atherosclerosis.  4.  Emphysema.    EKG: Normal sinus rhythm, no ST/T wave changes  Debbora Presto, MD  [facility] Pager [phone]  If 7PM-7AM, please contact night-coverage [email or web address] Password [facility] 08/08/2012, 11:26 AM

## 2012-08-08 NOTE — Progress Notes (Signed)
ANTICOAGULATION CONSULT NOTE - Initial Consult  Pharmacy Consult for Lovenox Indication: Acute Right DVT, Chronic Left DVT  No Known Allergies  Patient Measurements: Height: 6\' 2"  (188 cm) Weight: 180 lb (81.647 kg) IBW/kg (Calculated) : 82.2  Vital Signs: Temp: 98.2 F (36.8 C) (05/15 1258) Temp src: Oral (05/15 1258) BP: 127/85 mmHg (05/15 1258) Pulse Rate: 95 (05/15 1258)  Labs:  Recent Labs  08/08/12 1025  HGB 12.8*  HCT 37.5*  PLT 112*  APTT 25  LABPROT 13.6  INR 1.05  CREATININE 0.77    Estimated Creatinine Clearance: 124.7 ml/min (by C-G formula based on Cr of 0.77).   Medical History: Past Medical History  Diagnosis Date  . Asthma   . DVT (deep venous thrombosis)   . Pulmonary embolism     Medications:  Scheduled:  . aspirin  325 mg Oral QODAY  . mometasone-formoterol  2 puff Inhalation BID  . sodium chloride  3 mL Intravenous Q12H  . sodium chloride  3 mL Intravenous Q12H    Assessment: 52 yo M with hx of DVTs and PEs, noncompliant with blood thinners as an outpatient, who is admitted 08/08/12 with new RLE DVT. Doppler also shows chronic LLE DVT. Chest CT today was (-) for PE. Baseline PT/INR and aPTT wnl. MD aware plt=112 at baseline. Plan is to start full-dose Lovenox. Wt=81.6kg, CrCl >147ml/min.  Goal of Therapy:  Anti-Xa level 0.6-1.2 units/ml 4hrs after LMWH dose given Monitor platelets by anticoagulation protocol: Yes   Plan:  1) Lovenox 1mg /kg sq q12h 2) F/U long term anticoag plans vs compliance.  Darrol Angel, PharmD Pager: [phone] 08/08/2012,1:13 PM

## 2012-08-08 NOTE — Progress Notes (Signed)
*  PRELIMINARY RESULTS* Vascular Ultrasound Lower extremity venous duplex has been completed.  Preliminary findings: Right = Acute DVT involving the common femoral, femoral, profunda femoral, popliteal, posterior tibial, and peroneal veins. Left = Chronic DVT involving the common femoral, femoral, and posterior tibial veins. Subacute to chronic DVT involving the popliteal vein.  Farrel Demark, RDMS, RVT  08/08/2012, 9:38 AM

## 2012-08-09 DIAGNOSIS — J45909 Unspecified asthma, uncomplicated: Secondary | ICD-10-CM

## 2012-08-09 DIAGNOSIS — F17200 Nicotine dependence, unspecified, uncomplicated: Secondary | ICD-10-CM

## 2012-08-09 DIAGNOSIS — Z720 Tobacco use: Secondary | ICD-10-CM | POA: Diagnosis present

## 2012-08-09 DIAGNOSIS — D696 Thrombocytopenia, unspecified: Secondary | ICD-10-CM

## 2012-08-09 LAB — CBC
HCT: 36.9 % — ABNORMAL LOW (ref 39.0–52.0)
RDW: 13.7 % (ref 11.5–15.5)
WBC: 5.4 10*3/uL (ref 4.0–10.5)

## 2012-08-09 LAB — BASIC METABOLIC PANEL
Chloride: 98 mEq/L (ref 96–112)
Creatinine, Ser: 0.62 mg/dL (ref 0.50–1.35)
GFR calc Af Amer: >90 mL/min (ref >90)
GFR calc non Af Amer: >90 mL/min (ref >90)
Potassium: 4.3 mEq/L (ref 3.5–5.1)

## 2012-08-09 MED ORDER — RIVAROXABAN 15 MG PO TABS
15.0000 mg | ORAL_TABLET | Freq: Two times a day (BID) | ORAL | Status: DC
  Administered 2012-08-09 – 2012-08-11 (×4): 15 mg via ORAL
  Filled 2012-08-09 (×6): qty 1

## 2012-08-09 NOTE — Progress Notes (Signed)
  Pharmacy Note (Brief) - Lovenox --> Xarelto Indication: Acute Right DVT, Chronic Left DVT  Patient told MD he was previous on Xarelto and would be willing to take it again. Per v.o. Dr. Waymon Amato, will transition patient from Lovenox 1mg /kg q12h to Xarelto 15 mg PO twice daily with food for 21 days, followed by 20 mg PO once daily with food. SCr = 0.62, CrCl>112ml/min. Pateint has received 2 doses of Lovenox so far. Xarelto will start 0-2 hours before the time of the next Lovenox dose would have been due.  Plan: 1) Xarelto 15mg  PO BID with food x21 days (start at 5pm tonight), then 2) Xarelto 20mg  PO daily with food (will let MD write this order at discharge) 3) Pharmacy will sign off.  Darrol Angel, PharmD Pager: [phone] 08/09/2012 10:48 AM

## 2012-08-09 NOTE — Progress Notes (Signed)
[facility] PROGRESS NOTE  [name] ZOX:[id number] DOB: November 17, 1960 DOA: 08/08/2012 PCP: Jearld Lesch, MD  Brief narrative 52 year old male patient with history of recurrent VTE-20 year history, on and off anticoagulants, asthma (no COPD), tobacco abuse, stopped taking Xarelto a month ago for no particular reason ("I don't know") and started himself on aspirin. He now presented to the hospital on 08/08/12 with pain and swelling of right lower extremity. Lower extremity venous Dopplers confirmed extensive acute right lower extremity DVT along with chronic left lower extremity DVT. CTA chest negative for PE. He was admitted for further management.  Assessment/Plan: 1. Recurrent DVT (acute right lower extremity DVT and chronic left lower extremity DVT): Patient seems to have history of noncompliance. Counseled extensively regarding compliance with medications otherwise risk for deterioration and even death. He verbalizes understanding. He is willing to resume Xarelto. Patient would benefit from hematology consultation as outpatient-claims to not have seen once ever. 2. Asthma: Stable. Outpatient followup with pulmonology. 3. Thrombocytopenia, chronic: Stable 4. Tobacco abuse: Cessation counseled. 5. ? BPH: May consider Flomax if persisting symptoms. 6. Medication noncompliance  Code Status: Full Family Communication: Discussed with patient Disposition Plan: Home? 5/17   Consultants:  None  Procedures:  None  Antibiotics:  None   HPI/Subjective: Intermittent right lower extremity pain. No chest pain or dyspnea. Generally feels tired. Urinary difficulty-problems initiating stream this morning.  Objective: Filed Vitals:   08/09/12 0941 08/09/12 0942 08/09/12 0943 08/09/12 1450  BP: 117/76 114/78 98/68 126/89  Pulse: 80 86 101 75  Temp:    98.2 F (36.8 C)  TempSrc:    Oral  Resp:    18  Height:      Weight:      SpO2:    100%    Intake/Output Summary (Last  24 hours) at 08/09/12 1802 Last data filed at 08/09/12 1617  Gross per 24 hour  Intake    360 ml  Output    375 ml  Net    -15 ml   Filed Weights   08/08/12 0849 08/08/12 1258  Weight: 81.647 kg (180 lb) 81.647 kg (180 lb)    Exam:   General exam: Comfortable. Frail.  Respiratory system: Clear. No increased work of breathing.  Cardiovascular system: S1 & S2 heard, RRR. No JVD, murmurs, gallops, clicks or pedal edema.  Gastrointestinal system: Abdomen is nondistended, soft and nontender. Normal bowel sounds heard.  Central nervous system: Alert and oriented. No focal neurological deficits.  Extremities: Symmetric 5 x 5 power. Right lower extremity diffusely swollen from toes to groin but no other acute findings.   Data Reviewed: Basic Metabolic Panel:  Recent Labs Lab 08/08/12 1025 08/09/12 0423  NA 136 134*  K 3.8 4.3  CL 102 98  CO2 24 23  GLUCOSE 88 119*  BUN 14 13  CREATININE 0.77 0.62  CALCIUM 9.1 8.7   Liver Function Tests: No results found for this basename: AST, ALT, ALKPHOS, BILITOT, PROT, ALBUMIN,  in the last 168 hours No results found for this basename: LIPASE, AMYLASE,  in the last 168 hours No results found for this basename: AMMONIA,  in the last 168 hours CBC:  Recent Labs Lab 08/08/12 1025 08/09/12 0423  WBC 4.7 5.4  NEUTROABS 2.8  --   HGB 12.8* 12.4*  HCT 37.5* 36.9*  MCV 91.2 91.1  PLT 112* 118*   Cardiac Enzymes: No results found for this basename: CKTOTAL, CKMB, CKMBINDEX, TROPONINI,  in the last 168 hours BNP (last 3  results)  Recent Labs  04/01/12 0853  PROBNP 337.3*   CBG: No results found for this basename: GLUCAP,  in the last 168 hours  No results found for this or any previous visit (from the past 240 hour(s)).   Studies: Ct Angio Chest Pe W/cm &/or Wo Cm  08/08/2012   *RADIOLOGY REPORT*  Clinical Data: Shortness of breath.  Right groin pain.  History pulmonary emboli and deep vein thrombosis.  CT ANGIOGRAPHY  CHEST  Technique:  Multidetector CT imaging of the chest using the standard protocol during bolus administration of intravenous contrast. Multiplanar reconstructed images including MIPs were obtained and reviewed to evaluate the vascular anatomy.  Contrast: OMNIPAQUE IOHEXOL 350 MG/ML SOLN  Comparison: 05/27/2012  Findings: No filling defect is identified in the pulmonary arterial tree to suggest pulmonary embolus.  Coronary artery atherosclerosis noted.  No interventricular septal reversal.  Bilateral smooth airway thickening noted.  Airway plugging observed in the left upper lobe.  Emphysema noted.  Thoracic spondylosis observed. No additional significant findings.  IMPRESSION:  1.  Smooth airway thickening, favoring bronchitis or reactive airways disease, with chronic mild left upper lobe airway plugging - most likely from chronic mucoid impaction rather than tumor. 2.  No embolus observed. 3.  Coronary artery atherosclerosis. 4.  Emphysema.   Original Report Authenticated By: Gaylyn Rong, M.D.     Additional labs:   Scheduled Meds: . aspirin  325 mg Oral QODAY  . mometasone-formoterol  2 puff Inhalation BID  . rivaroxaban  15 mg Oral BID WC  . sodium chloride  3 mL Intravenous Q12H  . sodium chloride  3 mL Intravenous Q12H   Continuous Infusions:   Principal Problem:   Acute DVT (deep venous thrombosis) Active Problems:   Asthma, persistent   Thrombocytopenia, unspecified   Tobacco abuse    Time spent: 30 minutes.    [facility]  [facility] Pager [phone].   If 8PM-8AM, please contact night-coverage at [email or web address], password [facility] 08/09/2012, 6:02 PM  LOS: 1 day

## 2012-08-10 MED ORDER — ACETAMINOPHEN 325 MG PO TABS: 650.0000 mg | ORAL_TABLET | ORAL | Status: DC | PRN

## 2012-08-10 MED ORDER — HYDROMORPHONE HCL PF 1 MG/ML IJ SOLN: 1.0000 mg | INTRAMUSCULAR | Status: DC | PRN

## 2012-08-10 MED ORDER — RIVAROXABAN 20 MG PO TABS: 20.0000 mg | ORAL_TABLET | Freq: Every day | ORAL | Status: DC

## 2012-08-10 NOTE — Progress Notes (Signed)
[facility] PROGRESS NOTE  [name] ZOX:[id number] DOB: [birth year] DOA: 08/08/2012 PCP: Jearld Lesch, MD  Brief narrative 52 year old male patient with history of recurrent VTE-20 year history, on and off anticoagulants, asthma (no COPD), tobacco abuse, stopped taking Xarelto a month ago for no particular reason ("I don't know") and started himself on aspirin. He now presented to the hospital on 08/08/12 with pain and swelling of right lower extremity. Lower extremity venous Dopplers confirmed extensive acute right lower extremity DVT along with chronic left lower extremity DVT. CTA chest negative for PE. He was admitted for further management.  Assessment/Plan: 1. Recurrent DVT (acute right lower extremity DVT and chronic left lower extremity DVT): Patient seems to have history of noncompliance. Counseled extensively regarding compliance with medications otherwise risk for deterioration and even death. He verbalizes understanding. Resumed Xarelto. Patient would benefit from hematology consultation as outpatient-claims to not have seen once ever. Complains of right lower extremity pain on movement of standing. Adjust pain medications. PT evaluation. 2. Asthma: Stable. Outpatient followup with pulmonology. 3. Thrombocytopenia, chronic: Stable 4. Tobacco abuse: Cessation counseled. 5. ? BPH: May consider Flomax if persisting symptoms. 6. Medication noncompliance  Code Status: Full Family Communication: Discussed with patient Disposition Plan: Home? 5/18   Consultants:  None  Procedures:  None  Antibiotics:  None   HPI/Subjective: Right lower extremity pain, worse with movement or standing. No bleeding. Does not complain of urinary symptoms today.  Objective: Filed Vitals:   08/09/12 2041 08/09/12 2113 08/10/12 0441 08/10/12 0849  BP:  117/81 114/69   Pulse:  75 82   Temp:  98.8 F (37.1 C) 98.3 F (36.8 C)   TempSrc:  Oral Oral   Resp:  18 18   Height:       Weight:      SpO2: 99% 100% 99% 99%    Intake/Output Summary (Last 24 hours) at 08/10/12 1231 Last data filed at 08/10/12 1004  Gross per 24 hour  Intake    480 ml  Output    500 ml  Net    -20 ml   Filed Weights   08/08/12 0849 08/08/12 1258  Weight: 81.647 kg (180 lb) 81.647 kg (180 lb)    Exam:   General exam: Comfortable. Frail.  Respiratory system: Clear. No increased work of breathing.  Cardiovascular system: S1 & S2 heard, RRR. No JVD, murmurs, gallops, clicks. 2+ pitting right lower extremity edema. Telemetry: Sinus rhythm.  Gastrointestinal system: Abdomen is nondistended, soft and nontender. Normal bowel sounds heard.  Central nervous system: Alert and oriented. No focal neurological deficits.  Extremities: Symmetric 5 x 5 power. Right lower extremity diffusely swollen from toes to groin but no other acute findings. Peripheral pulses symmetrically felt.   Data Reviewed: Basic Metabolic Panel:  Recent Labs Lab 08/08/12 1025 08/09/12 0423  NA 136 134*  K 3.8 4.3  CL 102 98  CO2 24 23  GLUCOSE 88 119*  BUN 14 13  CREATININE 0.77 0.62  CALCIUM 9.1 8.7   Liver Function Tests: No results found for this basename: AST, ALT, ALKPHOS, BILITOT, PROT, ALBUMIN,  in the last 168 hours No results found for this basename: LIPASE, AMYLASE,  in the last 168 hours No results found for this basename: AMMONIA,  in the last 168 hours CBC:  Recent Labs Lab 08/08/12 1025 08/09/12 0423  WBC 4.7 5.4  NEUTROABS 2.8  --   HGB 12.8* 12.4*  HCT 37.5* 36.9*  MCV 91.2 91.1  PLT 112* 118*  Cardiac Enzymes: No results found for this basename: CKTOTAL, CKMB, CKMBINDEX, TROPONINI,  in the last 168 hours BNP (last 3 results)  Recent Labs  04/01/12 0853  PROBNP 337.3*   CBG: No results found for this basename: GLUCAP,  in the last 168 hours  No results found for this or any previous visit (from the past 240 hour(s)).   Studies: No results found.   Additional  labs:   Scheduled Meds: . aspirin  325 mg Oral QODAY  . mometasone-formoterol  2 puff Inhalation BID  . [START ON 08/31/2012] rivaroxaban  20 mg Oral Q breakfast  . rivaroxaban  15 mg Oral BID WC  . sodium chloride  3 mL Intravenous Q12H  . sodium chloride  3 mL Intravenous Q12H   Continuous Infusions:   Principal Problem:   Acute DVT (deep venous thrombosis) Active Problems:   Asthma, persistent   Thrombocytopenia, unspecified   Tobacco abuse    Time spent: 30 minutes.    [facility]  [facility] Pager [phone].   If 8PM-8AM, please contact night-coverage at [email or web address], password [facility] 08/10/2012, 12:31 PM  LOS: 2 days

## 2012-08-11 MED ORDER — RIVAROXABAN 15 MG PO TABS: 15.0000 mg | ORAL_TABLET | Freq: Two times a day (BID) | ORAL | Status: DC

## 2012-08-11 MED ORDER — HYDROCODONE-ACETAMINOPHEN 5-325 MG PO TABS: 1.0000 | ORAL_TABLET | Freq: Four times a day (QID) | ORAL | Status: DC | PRN

## 2012-08-11 MED ORDER — RIVAROXABAN 20 MG PO TABS: 20.0000 mg | ORAL_TABLET | Freq: Every day | ORAL | Status: DC

## 2012-08-11 MED ORDER — SODIUM CHLORIDE 0.9 % IJ SOLN: 3.0000 mL | Freq: Two times a day (BID) | INTRAMUSCULAR | Status: DC

## 2012-08-11 NOTE — Progress Notes (Signed)
Spoke with [name], per MD request regarding preauth for Xarelto. She states pt will need to take his Rx to the pharmacy and the pharmacist will call Dr. Waymon Amato if necessary; pt verbalizes understanding of discharge instructions.

## 2012-08-11 NOTE — Discharge Summary (Addendum)
Physician Discharge Summary  [name] HYQ:[id number] DOB: [birth year] DOA: 08/08/2012  PCP: Jearld Lesch, MD  Admit date: 08/08/2012 Discharge date: 08/11/2012  Time spent: Less than 30 minutes  Recommendations for Outpatient Follow-up:  1. Dr. Lerry Liner, PCP in 3 days. 2. Consider OP Hematology consultation to evaluate for recurrent VTE.  Discharge Diagnoses:  Principal Problem:   Acute DVT (deep venous thrombosis) Active Problems:   Asthma, persistent   Thrombocytopenia, unspecified   Tobacco abuse   Discharge Condition: Improved & Stable  Diet recommendation: Heart Healthy diet.  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 He now presented to the hospital on 08/08/12 with pain and swelling of right lower extremity. Lower extremity venous Dopplers confirmed extensive acute right lower extremity DVT along with chronic left lower extremity DVT. CTA chest negative for PE. He was admitted for further management.  Hospital Course:  1. Recurrent DVT (acute right lower extremity DVT and chronic left lower extremity DVT): Patient seems to have history of noncompliance. Counseled extensively regarding compliance with medications otherwise risk for deterioration and even death. He verbalized understanding. Resumed Xarelto. Patient would benefit from hematology consultation as outpatient-claims to not have seen once ever. Patient continued to complain of significant pain on day of discharge, however he did not appear to be in pain or discomfort even with ambulation. He claimed that the pain medications that were being given were not helping him. When he was advised  that the pain and swelling may gradually come down and that he was already on pain medications the doses of which would not be increased, he abruptly demanded discharge and did not want to wait for PT evaluation or checking with his pharmacy if his medications required pre authorization. He was counseled to pick up his medications from the pharmacy and importance of compliance and possible dangerous consequences to his health if he did not.  2. Asthma: Stable. Outpatient followup with pulmonology. 3. Thrombocytopenia, chronic: Stable 4. Tobacco abuse: Cessation counseled. 5. ? BPH: Complained of difficulty initiating urination on first day and then did not complain. 6. Medication noncompliance: as above.  Procedures:  None   Consultations:  None  Discharge Exam:  Complaints: RLE swelling better, but still complains of pain. Seen ambulating in room without significant discomfort. Feels tired- chronic symptom  Filed Vitals:   08/10/12 1936 08/10/12 2101 08/11/12 0536 08/11/12 0839  BP:  120/77 137/80   Pulse:  78 66   Temp:  98.4 F (36.9 C) 97.6 F (36.4 C)   TempSrc:  Oral Oral   Resp:  18 18   Height:      Weight:      SpO2: 98% 99% 100% 96%    General exam: Comfortable. Frail.  Respiratory system: Clear. No increased work of breathing.  Cardiovascular system: S1 & S2 heard, RRR. No JVD, murmurs, gallops, clicks. 2+ pitting right lower extremity edema. Telemetry: Sinus rhythm.  Gastrointestinal system: Abdomen is nondistended, soft and nontender. Normal bowel sounds heard.  Central nervous system: Alert and oriented. No focal neurological deficits.  Extremities: Symmetric 5 x 5 power. Right lower extremity diffusely swollen from toes to groin(decreasing) but no other acute findings.  Peripheral pulses symmetrically felt.   Discharge Instructions      Discharge Orders   Future Orders Complete By Expires     Call MD for:  difficulty breathing, headache or visual  disturbances  As directed     Call MD for:  redness, tenderness, or signs of infection (pain, swelling, redness, odor or green/yellow discharge around incision site)  As directed     Call MD for:  severe uncontrolled pain  As directed     Call MD for:  temperature >100.4  As directed     Diet - low sodium heart healthy  As directed     Increase activity slowly  As directed         Medication List    TAKE these medications       aspirin 325 MG tablet  Take 325 mg by mouth every other day.     budesonide-formoterol 160-4.5 MCG/ACT inhaler  Commonly known as:  SYMBICORT  Inhale 2 puffs into the lungs daily as needed.     Fluticasone-Salmeterol 250-50 MCG/DOSE Aepb  Commonly known as:  ADVAIR  Inhale 1 puff into the lungs 2 (two) times daily as needed.     HYDROcodone-acetaminophen 5-325 MG per tablet  Commonly known as:  NORCO/VICODIN  Take 1 tablet by mouth every 6 (six) hours as needed for pain.     Ipratropium-Albuterol 20-100 MCG/ACT Aers respimat  Commonly known as:  COMBIVENT RESPIMAT  Inhale 1 puff into the lungs every 6 (six) hours as needed for wheezing or shortness of breath.     Rivaroxaban 15 MG Tabs tablet  Commonly known as:  XARELTO  Take 1 tablet (15 mg total) by mouth 2 (two) times daily with a meal. Stop after 08/30/12 doses.     Rivaroxaban 20 MG Tabs  Commonly known as:  XARELTO  Take 1 tablet (20 mg total) by mouth daily with breakfast. First dose on Sat 08/31/12 at 0800  Start taking on:  08/31/2012       Follow-up Information   Follow up with Jearld Lesch, MD. Schedule an appointment as soon as possible for a visit in 3 days.   Contact information:   [address] [place] [address] [phone]        The results of significant diagnostics from this hospitalization (including imaging, microbiology, ancillary and laboratory) are listed below for reference.    Significant Diagnostic Studies: Ct Angio Chest Pe W/cm &/or Wo  Cm  08/08/2012   *RADIOLOGY REPORT*  Clinical Data: Shortness of breath.  Right groin pain.  History pulmonary emboli and deep vein thrombosis.  CT ANGIOGRAPHY CHEST  Technique:  Multidetector CT imaging of the chest using the standard protocol during bolus administration of intravenous contrast. Multiplanar reconstructed images including MIPs were obtained and reviewed to evaluate the vascular anatomy.  Contrast: OMNIPAQUE IOHEXOL 350 MG/ML SOLN  Comparison: 05/27/2012  Findings: No filling defect is identified in the pulmonary arterial tree to suggest pulmonary embolus.  Coronary artery atherosclerosis noted.  No interventricular septal reversal.  Bilateral smooth airway thickening noted.  Airway plugging observed in the left upper lobe.  Emphysema noted.  Thoracic spondylosis observed. No additional significant findings.  IMPRESSION:  1.  Smooth airway thickening, favoring bronchitis or reactive airways disease, with chronic mild left upper lobe airway plugging - most likely from chronic mucoid impaction rather than tumor. 2.  No embolus observed. 3.  Coronary artery atherosclerosis. 4.  Emphysema.   Original Report Authenticated By:  Gaylyn Rong, M.D.   Bilateral Lower extremity Venous doppler Summary:  - Findings consistent with acute deep vein thrombosis involving the right common femoral vein, right profunda femoris vein, right femoral vein, right popliteal vein, right posterial tibial vein, and right peroneal vein. - Findings consistent with subacute versus chronic deep vein thrombosis involving the left common femoral vein, left profunda femoris vein, left femoral vein, left popliteal vein, left posterial tibial vein, and left peroneal vein. - Very minimal venousflow noted in the rightside that appears to be continuous, as well as the venous flow of the left is continuous. This is indicative of a proximal obstruction, possibly in the iliac veins or IVC.    Microbiology: No results  found for this or any previous visit (from the past 240 hour(s)).   Labs: Basic Metabolic Panel:  Recent Labs Lab 08/08/12 1025 08/09/12 0423  NA 136 134*  K 3.8 4.3  CL 102 98  CO2 24 23  GLUCOSE 88 119*  BUN 14 13  CREATININE 0.77 0.62  CALCIUM 9.1 8.7   Liver Function Tests: No results found for this basename: AST, ALT, ALKPHOS, BILITOT, PROT, ALBUMIN,  in the last 168 hours No results found for this basename: LIPASE, AMYLASE,  in the last 168 hours No results found for this basename: AMMONIA,  in the last 168 hours CBC:  Recent Labs Lab 08/08/12 1025 08/09/12 0423  WBC 4.7 5.4  NEUTROABS 2.8  --   HGB 12.8* 12.4*  HCT 37.5* 36.9*  MCV 91.2 91.1  PLT 112* 118*   Cardiac Enzymes: No results found for this basename: CKTOTAL, CKMB, CKMBINDEX, TROPONINI,  in the last 168 hours BNP: BNP (last 3 results)  Recent Labs  04/01/12 0853  PROBNP 337.3*   CBG: No results found for this basename: GLUCAP,  in the last 168 hours  Additional labs:    Signed:  HONGALGI,ANAND  [facility] 08/11/2012, 10:39 PM

## 2013-02-03 ENCOUNTER — Emergency Department (HOSPITAL_COMMUNITY)
Admission: EM | Admit: 2013-02-03 | Discharge: 2013-02-04 | Disposition: A | Discharge status: Home / Self Care | Payer: Medicare Other | Attending: Emergency Medicine | Admitting: Emergency Medicine

## 2013-02-03 ENCOUNTER — Emergency Department (HOSPITAL_COMMUNITY): Payer: Medicare Other

## 2013-02-03 ENCOUNTER — Encounter (HOSPITAL_COMMUNITY): Payer: Self-pay | Admitting: Emergency Medicine

## 2013-02-03 DIAGNOSIS — M549 Dorsalgia, unspecified: Secondary | ICD-10-CM | POA: Insufficient documentation

## 2013-02-03 DIAGNOSIS — Y939 Activity, unspecified: Secondary | ICD-10-CM | POA: Insufficient documentation

## 2013-02-03 DIAGNOSIS — F919 Conduct disorder, unspecified: Secondary | ICD-10-CM | POA: Insufficient documentation

## 2013-02-03 DIAGNOSIS — W2203XA Walked into furniture, initial encounter: Secondary | ICD-10-CM | POA: Insufficient documentation

## 2013-02-03 DIAGNOSIS — J45909 Unspecified asthma, uncomplicated: Secondary | ICD-10-CM | POA: Insufficient documentation

## 2013-02-03 DIAGNOSIS — S0990XA Unspecified injury of head, initial encounter: Secondary | ICD-10-CM | POA: Insufficient documentation

## 2013-02-03 DIAGNOSIS — F17200 Nicotine dependence, unspecified, uncomplicated: Secondary | ICD-10-CM | POA: Insufficient documentation

## 2013-02-03 DIAGNOSIS — Z79899 Other long term (current) drug therapy: Secondary | ICD-10-CM | POA: Insufficient documentation

## 2013-02-03 DIAGNOSIS — Z86718 Personal history of other venous thrombosis and embolism: Secondary | ICD-10-CM | POA: Insufficient documentation

## 2013-02-03 DIAGNOSIS — Y929 Unspecified place or not applicable: Secondary | ICD-10-CM | POA: Insufficient documentation

## 2013-02-03 DIAGNOSIS — F1010 Alcohol abuse, uncomplicated: Secondary | ICD-10-CM | POA: Insufficient documentation

## 2013-02-03 DIAGNOSIS — Z7901 Long term (current) use of anticoagulants: Secondary | ICD-10-CM | POA: Insufficient documentation

## 2013-02-03 DIAGNOSIS — F10929 Alcohol use, unspecified with intoxication, unspecified: Secondary | ICD-10-CM

## 2013-02-03 DIAGNOSIS — R55 Syncope and collapse: Secondary | ICD-10-CM

## 2013-02-03 DIAGNOSIS — M25569 Pain in unspecified knee: Secondary | ICD-10-CM | POA: Insufficient documentation

## 2013-02-03 DIAGNOSIS — W19XXXA Unspecified fall, initial encounter: Secondary | ICD-10-CM | POA: Insufficient documentation

## 2013-02-03 DIAGNOSIS — G8929 Other chronic pain: Secondary | ICD-10-CM | POA: Insufficient documentation

## 2013-02-03 DIAGNOSIS — Z86711 Personal history of pulmonary embolism: Secondary | ICD-10-CM | POA: Insufficient documentation

## 2013-02-03 DIAGNOSIS — R51 Headache: Secondary | ICD-10-CM | POA: Insufficient documentation

## 2013-02-03 LAB — COMPREHENSIVE METABOLIC PANEL
Albumin: 3.3 g/dL — ABNORMAL LOW (ref 3.5–5.2)
Alkaline Phosphatase: 89 U/L (ref 39–117)
BUN: 13 mg/dL (ref 6–23)
Calcium: 9.3 mg/dL (ref 8.4–10.5)
GFR calc Af Amer: >90 mL/min (ref >90)
Glucose, Bld: 119 mg/dL — ABNORMAL HIGH (ref 70–99)
Potassium: 3.6 mEq/L (ref 3.5–5.1)
Total Protein: 7.6 g/dL (ref 6.0–8.3)

## 2013-02-03 LAB — RAPID URINE DRUG SCREEN, HOSP PERFORMED
Amphetamines: NOT DETECTED
Benzodiazepines: NOT DETECTED
Cocaine: NOT DETECTED
Opiates: NOT DETECTED

## 2013-02-03 LAB — CBC WITH DIFFERENTIAL/PLATELET
Basophils Relative: 0 % (ref 0–1)
Eosinophils Absolute: 0.2 10*3/uL (ref 0.0–0.7)
Eosinophils Relative: 6 % — ABNORMAL HIGH (ref 0–5)
Lymphs Abs: 1.8 10*3/uL (ref 0.7–4.0)
MCH: 32.6 pg (ref 26.0–34.0)
MCHC: 35.6 g/dL (ref 30.0–36.0)
MCV: 91.6 fL (ref 78.0–100.0)
Monocytes Relative: 13 % — ABNORMAL HIGH (ref 3–12)
Neutrophils Relative %: 30 % — ABNORMAL LOW (ref 43–77)
Platelets: 139 10*3/uL — ABNORMAL LOW (ref 150–400)

## 2013-02-03 LAB — ETHANOL: Alcohol, Ethyl (B): 252 mg/dL — ABNORMAL HIGH (ref 0–11)

## 2013-02-03 MED ORDER — MORPHINE SULFATE 4 MG/ML IJ SOLN
4.0000 mg | Freq: Once | INTRAMUSCULAR | Status: AC
  Administered 2013-02-03: 4 mg via INTRAVENOUS
  Filled 2013-02-03: qty 1

## 2013-02-03 NOTE — ED Provider Notes (Signed)
CSN: [id number]     Arrival date & time 02/03/13  1844 History   First MD Initiated Contact with Patient 02/03/13 1847    Level V caveat due to intoxication Chief Complaint  Patient presents with  . Loss of Consciousness   (Consider location/radiation/quality/duration/timing/severity/associated sxs/prior Treatment) Patient is a 52 y.o. male presenting with syncope.  Loss of Consciousness Associated symptoms: no chest pain, no shortness of breath and no vomiting    patient presents after syncope and fall. He states his been drinking but has not drunk. He states he does not know exactly what happened. He fell and hit his head on a table. He states that the table was not supposed to be there. Complaining of mild headache now. He was reportedly somewhat belligerent and tried to get out of the ambulance. He is on Xarelto  he is also complaining of pain in his right knee and back. He states he always has pain here. CT is chronic pain in his back and both legs due to his DVTs. for PE and DVT.  Past Medical History  Diagnosis Date  . Asthma   . DVT (deep venous thrombosis)   . Pulmonary embolism    History reviewed. No pertinent past surgical history. Family History  Problem Relation Age of Onset  . Asthma Mother   . Allergies Mother   . Allergies Sister   . Allergies Brother    History  Substance Use Topics  . Smoking status: Current Some Day Smoker -- 0.20 packs/day for 30 years    Types: Cigarettes  . Smokeless tobacco: Never Used  . Alcohol Use: Yes     Comment: occasionally on football days    Review of Systems  Unable to perform ROS Eyes: Negative for pain.  Respiratory: Negative for chest tightness and shortness of breath.   Cardiovascular: Positive for syncope. Negative for chest pain and leg swelling.  Gastrointestinal: Negative for vomiting.  Genitourinary: Negative for flank pain.  Musculoskeletal: Positive for back pain. Negative for neck pain and neck stiffness.   Psychiatric/Behavioral: Positive for behavioral problems.    Allergies  Review of patient's allergies indicates no known allergies.  Home Medications   Current Outpatient Rx  Name  Route  Sig  Dispense  Refill  . budesonide-formoterol (SYMBICORT) 160-4.5 MCG/ACT inhaler   Inhalation   Inhale 2 puffs into the lungs daily as needed.         . traMADol (ULTRAM) 50 MG tablet   Oral   Take 1 tablet (50 mg total) by mouth every 6 (six) hours as needed.   15 tablet   0    BP 107/69  Pulse 82  Temp(Src) 97.5 F (36.4 C) (Oral)  Resp 16  SpO2 98% Physical Exam  Nursing note and vitals reviewed. Constitutional: He is oriented to person, place, and time. He appears well-developed and well-nourished.  HENT:  Head: Normocephalic and atraumatic.  Eyes: EOM are normal. Pupils are equal, round, and reactive to light.  Neck: Normal range of motion. Neck supple.  Cardiovascular: Normal rate, regular rhythm and normal heart sounds.   No murmur heard. Pulmonary/Chest: Effort normal and breath sounds normal.  Abdominal: Soft. Bowel sounds are normal. He exhibits no distension and no mass. There is no tenderness. There is no rebound and no guarding.  Musculoskeletal: Normal range of motion. He exhibits tenderness. He exhibits no edema.  Mild tenderness to right knee. ROM intact.  Neurological: He is alert and oriented to person, place, and time.  No cranial nerve deficit.  Patient appears intoxicated.  Skin: Skin is warm and dry.  Psychiatric: He has a normal mood and affect.    ED Course  Procedures (including critical care time) Labs Review Labs Reviewed  CBC WITH DIFFERENTIAL - Abnormal; Notable for the following:    WBC 3.6 (*)    Platelets 139 (*)    Neutrophils Relative % 30 (*)    Neutro Abs 1.1 (*)    Lymphocytes Relative 51 (*)    Monocytes Relative 13 (*)    Eosinophils Relative 6 (*)    All other components within normal limits  COMPREHENSIVE METABOLIC PANEL -  Abnormal; Notable for the following:    Glucose, Bld 119 (*)    Albumin 3.3 (*)    AST 45 (*)    All other components within normal limits  ETHANOL - Abnormal; Notable for the following:    Alcohol, Ethyl (B) 252 (*)    All other components within normal limits  URINE RAPID DRUG SCREEN (HOSP PERFORMED)  POCT I-STAT TROPONIN I   Imaging Review Ct Head Wo Contrast  02/03/2013   CLINICAL DATA:  Fall. Trauma to the head and neck.  EXAM: CT HEAD WITHOUT CONTRAST  CT CERVICAL SPINE WITHOUT CONTRAST  TECHNIQUE: Multidetector CT imaging of the head and cervical spine was performed following the standard protocol without intravenous contrast. Multiplanar CT image reconstructions of the cervical spine were also generated.  COMPARISON:  05/27/2012  FINDINGS: CT HEAD FINDINGS  No sign of acute infarction, mass lesion, hemorrhage, hydrocephalus or extra-axial collection. A right frontal white matter low density is unchanged since previous studies. The calvarium is unremarkable. There are inflammatory changes of the maxillary and ethmoid sinuses.  CT CERVICAL SPINE FINDINGS  Alignment is normal. No fracture. No significant degenerative changes. Soft tissues of the neck appear unremarkable.  IMPRESSION: Normal CT scan of the cervical spine.  No acute intracranial finding. Chronic white matter infarction in the right frontal lobe.   Electronically Signed   By: Paulina Fusi M.D.   On: 02/03/2013 20:28   Ct Cervical Spine Wo Contrast  02/03/2013   CLINICAL DATA:  Fall. Trauma to the head and neck.  EXAM: CT HEAD WITHOUT CONTRAST  CT CERVICAL SPINE WITHOUT CONTRAST  TECHNIQUE: Multidetector CT imaging of the head and cervical spine was performed following the standard protocol without intravenous contrast. Multiplanar CT image reconstructions of the cervical spine were also generated.  COMPARISON:  05/27/2012  FINDINGS: CT HEAD FINDINGS  No sign of acute infarction, mass lesion, hemorrhage, hydrocephalus or  extra-axial collection. A right frontal white matter low density is unchanged since previous studies. The calvarium is unremarkable. There are inflammatory changes of the maxillary and ethmoid sinuses.  CT CERVICAL SPINE FINDINGS  Alignment is normal. No fracture. No significant degenerative changes. Soft tissues of the neck appear unremarkable.  IMPRESSION: Normal CT scan of the cervical spine.  No acute intracranial finding. Chronic white matter infarction in the right frontal lobe.   Electronically Signed   By: Paulina Fusi M.D.   On: 02/03/2013 20:28    EKG Interpretation   None      Date: 02/03/2013  Rate: 77  Rhythm: normal sinus rhythm  QRS Axis: normal  Intervals: normal  ST/T Wave abnormalities: early repolarization  Conduction Disutrbances:none  Narrative Interpretation:   Old EKG Reviewed: unchanged    MDM   1. Syncope   2. Alcohol intoxication   3. Minor head injury, initial encounter  Patient with syncopal episode and hit his head. Likely related to his alcohol. No arrhythmia here. EKG reassuring. Head CT does not show intracranial hemorrhage. He has a history of DVT pulmonary embolism, however I doubt that is the cause of this. Patient's had previous syncope workup had will discharge home    Juliet Rude. Rubin Payor, MD 02/04/13 [phone]

## 2013-02-03 NOTE — ED Notes (Signed)
[facility] presents with a 52 yo male from home with a witnessed syncopal episode.  Girlfriend/friend witnessed pt fall and hit hit head on kitchen table.  Pt is alert and slightly confused possibly due to ETOH.  Pt admits to drinking earlier.  Pt has been acting erratic including trying to jump out of ambulance. Pt c/o lightheadedness and headache.

## 2013-02-04 MED ORDER — TRAMADOL HCL 50 MG PO TABS: 50.0000 mg | ORAL_TABLET | Freq: Four times a day (QID) | ORAL | Status: DC | PRN

## 2013-02-21 ENCOUNTER — Inpatient Hospital Stay (HOSPITAL_COMMUNITY)
Admission: EM | Admit: 2013-02-21 | Discharge: 2013-02-24 | DRG: 192 | Disposition: A | Discharge status: Home / Self Care | Payer: Medicare Other | Attending: Family Medicine | Admitting: Family Medicine

## 2013-02-21 ENCOUNTER — Emergency Department (HOSPITAL_COMMUNITY): Payer: Medicare Other

## 2013-02-21 ENCOUNTER — Encounter (HOSPITAL_COMMUNITY): Payer: Self-pay | Admitting: Emergency Medicine

## 2013-02-21 DIAGNOSIS — IMO0002 Reserved for concepts with insufficient information to code with codable children: Secondary | ICD-10-CM

## 2013-02-21 DIAGNOSIS — D72819 Decreased white blood cell count, unspecified: Secondary | ICD-10-CM

## 2013-02-21 DIAGNOSIS — Z86711 Personal history of pulmonary embolism: Secondary | ICD-10-CM

## 2013-02-21 DIAGNOSIS — R0902 Hypoxemia: Secondary | ICD-10-CM | POA: Diagnosis present

## 2013-02-21 DIAGNOSIS — Z825 Family history of asthma and other chronic lower respiratory diseases: Secondary | ICD-10-CM

## 2013-02-21 DIAGNOSIS — F17200 Nicotine dependence, unspecified, uncomplicated: Secondary | ICD-10-CM | POA: Diagnosis present

## 2013-02-21 DIAGNOSIS — R109 Unspecified abdominal pain: Secondary | ICD-10-CM

## 2013-02-21 DIAGNOSIS — J45901 Unspecified asthma with (acute) exacerbation: Secondary | ICD-10-CM | POA: Diagnosis present

## 2013-02-21 DIAGNOSIS — J441 Chronic obstructive pulmonary disease with (acute) exacerbation: Principal | ICD-10-CM | POA: Diagnosis present

## 2013-02-21 DIAGNOSIS — Z720 Tobacco use: Secondary | ICD-10-CM | POA: Diagnosis present

## 2013-02-21 DIAGNOSIS — E876 Hypokalemia: Secondary | ICD-10-CM

## 2013-02-21 DIAGNOSIS — I82409 Acute embolism and thrombosis of unspecified deep veins of unspecified lower extremity: Secondary | ICD-10-CM

## 2013-02-21 DIAGNOSIS — I82403 Acute embolism and thrombosis of unspecified deep veins of lower extremity, bilateral: Secondary | ICD-10-CM

## 2013-02-21 DIAGNOSIS — Z86718 Personal history of other venous thrombosis and embolism: Secondary | ICD-10-CM | POA: Diagnosis present

## 2013-02-21 LAB — POCT I-STAT, CHEM 8
BUN: 7 mg/dL (ref 6–23)
Calcium, Ion: 1.21 mmol/L (ref 1.12–1.23)
Chloride: 105 meq/L (ref 96–112)
Creatinine, Ser: 0.8 mg/dL (ref 0.50–1.35)
Glucose, Bld: 156 mg/dL — ABNORMAL HIGH (ref 70–99)
HCT: 48 % (ref 39.0–52.0)
Hemoglobin: 16.3 g/dL (ref 13.0–17.0)
Potassium: 3.2 meq/L — ABNORMAL LOW (ref 3.5–5.1)
Sodium: 143 meq/L (ref 135–145)
TCO2: 20 mmol/L (ref 0–100)

## 2013-02-21 LAB — CBC WITH DIFFERENTIAL/PLATELET
Basophils Relative: 0 % (ref 0–1)
Eosinophils Absolute: 0 10*3/uL (ref 0.0–0.7)
Eosinophils Relative: 1 % (ref 0–5)
HCT: 40.6 % (ref 39.0–52.0)
Hemoglobin: 13.8 g/dL (ref 13.0–17.0)
Lymphs Abs: 0.5 10*3/uL — ABNORMAL LOW (ref 0.7–4.0)
MCH: 31.9 pg (ref 26.0–34.0)
MCHC: 34 g/dL (ref 30.0–36.0)
MCV: 93.8 fL (ref 78.0–100.0)
Monocytes Absolute: 0.1 10*3/uL (ref 0.1–1.0)
Neutro Abs: 2.5 10*3/uL (ref 1.7–7.7)
Neutrophils Relative %: 80 % — ABNORMAL HIGH (ref 43–77)
RBC: 4.33 MIL/uL (ref 4.22–5.81)

## 2013-02-21 LAB — POCT I-STAT TROPONIN I: Troponin i, poc: 0 ng/mL (ref 0.00–0.08)

## 2013-02-21 MED ORDER — IPRATROPIUM BROMIDE 0.02 % IN SOLN
0.5000 mg | RESPIRATORY_TRACT | Status: DC | PRN
  Administered 2013-02-21: 0.5 mg via RESPIRATORY_TRACT
  Filled 2013-02-21: qty 2.5

## 2013-02-21 MED ORDER — ACETAMINOPHEN 325 MG PO TABS: 650.0000 mg | ORAL_TABLET | Freq: Four times a day (QID) | ORAL | Status: DC | PRN

## 2013-02-21 MED ORDER — ALBUTEROL SULFATE (5 MG/ML) 0.5% IN NEBU
2.5000 mg | INHALATION_SOLUTION | Freq: Four times a day (QID) | RESPIRATORY_TRACT | Status: DC
  Administered 2013-02-22 (×4): 2.5 mg via RESPIRATORY_TRACT
  Filled 2013-02-21 (×4): qty 0.5

## 2013-02-21 MED ORDER — POTASSIUM CHLORIDE CRYS ER 20 MEQ PO TBCR
40.0000 meq | EXTENDED_RELEASE_TABLET | Freq: Once | ORAL | Status: AC
  Administered 2013-02-21: 40 meq via ORAL
  Filled 2013-02-21: qty 2

## 2013-02-21 MED ORDER — ACETAMINOPHEN 650 MG RE SUPP: 650.0000 mg | Freq: Four times a day (QID) | RECTAL | Status: DC | PRN

## 2013-02-21 MED ORDER — ALBUTEROL SULFATE (5 MG/ML) 0.5% IN NEBU
2.5000 mg | INHALATION_SOLUTION | RESPIRATORY_TRACT | Status: DC | PRN
  Administered 2013-02-21: 2.5 mg via RESPIRATORY_TRACT
  Filled 2013-02-21: qty 0.5

## 2013-02-21 MED ORDER — ALBUTEROL SULFATE (5 MG/ML) 0.5% IN NEBU
10.0000 mg | INHALATION_SOLUTION | Freq: Once | RESPIRATORY_TRACT | Status: AC
  Administered 2013-02-21: 10 mg via RESPIRATORY_TRACT
  Filled 2013-02-21: qty 2

## 2013-02-21 MED ORDER — ONDANSETRON HCL 4 MG/2ML IJ SOLN: 4.0000 mg | Freq: Four times a day (QID) | INTRAMUSCULAR | Status: DC | PRN

## 2013-02-21 MED ORDER — ALBUTEROL SULFATE (5 MG/ML) 0.5% IN NEBU: 5.0000 mg | INHALATION_SOLUTION | RESPIRATORY_TRACT | Status: DC | PRN

## 2013-02-21 MED ORDER — PREDNISONE 20 MG PO TABS
60.0000 mg | ORAL_TABLET | Freq: Once | ORAL | Status: AC
  Administered 2013-02-21: 60 mg via ORAL
  Filled 2013-02-21: qty 3

## 2013-02-21 MED ORDER — LEVOFLOXACIN IN D5W 750 MG/150ML IV SOLN
750.0000 mg | INTRAVENOUS | Status: DC
  Administered 2013-02-21 – 2013-02-22 (×2): 750 mg via INTRAVENOUS
  Filled 2013-02-21 (×3): qty 150

## 2013-02-21 MED ORDER — METHYLPREDNISOLONE SODIUM SUCC 125 MG IJ SOLR
60.0000 mg | Freq: Two times a day (BID) | INTRAMUSCULAR | Status: DC
  Administered 2013-02-21 – 2013-02-23 (×4): 60 mg via INTRAVENOUS
  Filled 2013-02-21 (×6): qty 0.96

## 2013-02-21 MED ORDER — IPRATROPIUM BROMIDE 0.02 % IN SOLN
0.5000 mg | Freq: Once | RESPIRATORY_TRACT | Status: AC
  Administered 2013-02-21: 0.5 mg via RESPIRATORY_TRACT
  Filled 2013-02-21: qty 2.5

## 2013-02-21 MED ORDER — TRAMADOL HCL 50 MG PO TABS: 50.0000 mg | ORAL_TABLET | Freq: Four times a day (QID) | ORAL | Status: DC | PRN

## 2013-02-21 MED ORDER — ONDANSETRON HCL 4 MG PO TABS: 4.0000 mg | ORAL_TABLET | Freq: Four times a day (QID) | ORAL | Status: DC | PRN

## 2013-02-21 MED ORDER — IPRATROPIUM BROMIDE 0.02 % IN SOLN
0.5000 mg | Freq: Four times a day (QID) | RESPIRATORY_TRACT | Status: DC
  Administered 2013-02-22 (×4): 0.5 mg via RESPIRATORY_TRACT
  Filled 2013-02-21 (×4): qty 2.5

## 2013-02-21 MED ORDER — ALBUTEROL SULFATE (5 MG/ML) 0.5% IN NEBU: 2.5000 mg | INHALATION_SOLUTION | RESPIRATORY_TRACT | Status: DC | PRN

## 2013-02-21 MED ORDER — RIVAROXABAN 15 MG PO TABS
15.0000 mg | ORAL_TABLET | Freq: Two times a day (BID) | ORAL | Status: DC
  Administered 2013-02-21 – 2013-02-24 (×6): 15 mg via ORAL
  Filled 2013-02-21 (×8): qty 1

## 2013-02-21 MED ORDER — PNEUMOCOCCAL VAC POLYVALENT 25 MCG/0.5ML IJ INJ
0.5000 mL | INJECTION | INTRAMUSCULAR | Status: DC
  Filled 2013-02-21 (×2): qty 0.5

## 2013-02-21 NOTE — ED Notes (Signed)
Pt states he has been sob with ambulation for past week. HX asthma, had flare-up same time last year. Pt NAD. Pt has productive cough with white sputum. Also has nasal congestion. Pt ambulatory to room.

## 2013-02-21 NOTE — ED Provider Notes (Signed)
Patient was continued dyspnea. Will admit to internal medicine.  [name]. Rubin Payor, MD 02/21/13 [phone]

## 2013-02-21 NOTE — H&P (Signed)
[facility] History and Physical  Rector Devonshire WGN:[id number] DOB: 1961/01/05 DOA: 02/21/2013  Referring physician:  PCP: Jearld Lesch, MD  Specialists:   Chief Complaint: SOB, cough   HPI: [name] is a 52 y.o. male with PMH of asthma/copd, h/o DVTs presented with worsening SOB, productive cough for few days; denies chest pain, no fever, no nausea, vomiting or diarrhea; no focal neuro symptoms   Review of Systems: The patient denies anorexia, fever, weight loss,, vision loss, decreased hearing, hoarseness, chest pain, syncope, , balance deficits, hemoptysis, abdominal pain, melena, hematochezia, severe indigestion/heartburn, hematuria, incontinence, genital sores, muscle weakness, suspicious skin lesions, transient blindness, difficulty walking, depression, unusual weight change, abnormal bleeding, enlarged lymph nodes, angioedema, and breast masses.    Past Medical History  Diagnosis Date  . Asthma   . DVT (deep venous thrombosis)   . Pulmonary embolism    History reviewed. No pertinent past surgical history. Social History:  reports that he has been smoking Cigarettes.  He has a 6 pack-year smoking history. He has never used smokeless tobacco. He reports that he drinks alcohol. He reports that he does not use illicit drugs. Home: where does patient live--home, ALF, SNF? and with whom if at home? Yes:  Can patient participate in ADLs?  No Known Allergies  Family History  Problem Relation Age of Onset  . Asthma Mother   . Allergies Mother   . Allergies Sister   . Allergies Brother     (be sure to complete)  Prior to Admission medications   Medication Sig Start Date End Date Taking? Authorizing Provider  budesonide-formoterol (SYMBICORT) 160-4.5 MCG/ACT inhaler Inhale 2 puffs into the lungs daily as needed. 04/02/12  Yes Christiane Ha, MD  traMADol (ULTRAM) 50 MG tablet Take 1 tablet (50 mg total) by mouth every 6 (six) hours as needed. 02/04/13   Juliet Rude. Rubin Payor, MD   Physical Exam: Filed Vitals:   02/21/13 1639  BP: 135/77  Pulse: 95  Temp: 98.7 F (37.1 C)  Resp: 21     General:  alert  Eyes: eom-i  ENT: no oral ulcers  Neck: supple   Cardiovascular: s1,s2 rrr  Respiratory: few rhonchi ll   Abdomen: soft, nt, nd   Skin: some petechia   Musculoskeletal: no edema   Psychiatric: no hallucinations   Neurologic: CN 2-12 intact   Labs on Admission:  Basic Metabolic Panel:  Recent Labs Lab 02/21/13 1616  NA 143  K 3.2*  CL 105  GLUCOSE 156*  BUN 7  CREATININE 0.80   Liver Function Tests: No results found for this basename: AST, ALT, ALKPHOS, BILITOT, PROT, ALBUMIN,  in the last 168 hours No results found for this basename: LIPASE, AMYLASE,  in the last 168 hours No results found for this basename: AMMONIA,  in the last 168 hours CBC:  Recent Labs Lab 02/21/13 1605 02/21/13 1616  WBC 3.1*  --   NEUTROABS 2.5  --   HGB 13.8 16.3  HCT 40.6 48.0  MCV 93.8  --   PLT 111*  --    Cardiac Enzymes: No results found for this basename: CKTOTAL, CKMB, CKMBINDEX, TROPONINI,  in the last 168 hours  BNP (last 3 results)  Recent Labs  04/01/12 0853  PROBNP 337.3*   CBG: No results found for this basename: GLUCAP,  in the last 168 hours  Radiological Exams on Admission: Dg Chest 2 View  02/21/2013   CLINICAL DATA:  Shortness of breath.  Chest pain.  EXAM: CHEST  2 VIEW  COMPARISON:  Chest CT 08/08/2012.  FINDINGS: Minimal interstitial prominence noted in both lung fields. This is slightly more prominent than noted on prior chest CT. Mild pneumonitis cannot be excluded. Mediastinum and hilar structures normal. Heart size normal. No pleural effusion. No acute osseous abnormality.  IMPRESSION: Mild interstitial prominence bilaterally. Mild pneumonitis cannot be excluded.   Electronically Signed   By: Maisie Fus  Register   On: 02/21/2013 15:58    EKG: Independently reviewed. NSR, no acute st/t changes    Assessment/Plan Principal Problem:   Asthma exacerbation Active Problems:   DVT (deep venous thrombosis)  52 y.o. male with PMH of asthma/copd, h/o DVTs presented with worsening SOB, productive cough is admitted with asthma exacerbation   1. Asthma/copd exacerbation; active smoker; CXR: no clear infiltrates; patient is not taking any meds at home  -started IV steroids, atx, bronchodilators, oxygen; d/w: stop smoking  2. H/o recurrent DVT/PE; patient intermittently takes meds;  -restart xarelto; reemphasized medication compliance  3. Hypo K; replace    None:  if consultant consulted, please document name and whether formally or informally consulted  Code Status: full (must indicate code status--if unknown or must be presumed, indicate so) Family Communication: none at the bedside (indicate person spoken with, if applicable, with phone number if by telephone) Disposition Plan: home 24-48 hours (indicate anticipated LOS)  Time spent: >35 minutes  Esperanza Sheets [facility] Pager [phone]  If 7PM-7AM, please contact night-coverage [email or web address] Password [facility] 02/21/2013, 6:41 PM

## 2013-02-21 NOTE — ED Provider Notes (Signed)
CSN: [id number]     Arrival date & time 02/21/13  1113 History   First MD Initiated Contact with Patient 02/21/13 1117     Chief Complaint  Patient presents with  . Shortness of Breath   (Consider location/radiation/quality/duration/timing/severity/associated sxs/prior Treatment) HPI This 52 year old male has a history of asthma as well as continued smoking (he was told to discontinue smoking), for last 3-4 days he has cough shortness breath wheezing, used his albuterol inhaler which helped somewhat but then he ran out of his inhaler, his breathing is gradually getting worse over last few days, he is no fever no confusion no chest pain no abdominal pain no vomiting no other concerns, he is taking Xarelto for DVT/PE. He is moderately severe shortness of breath and would like to be discharged if possible. Past Medical History  Diagnosis Date  . Asthma   . DVT (deep venous thrombosis)   . Pulmonary embolism    History reviewed. No pertinent past surgical history. Family History  Problem Relation Age of Onset  . Asthma Mother   . Allergies Mother   . Allergies Sister   . Allergies Brother    History  Substance Use Topics  . Smoking status: Current Some Day Smoker -- 0.20 packs/day for 30 years    Types: Cigarettes  . Smokeless tobacco: Never Used  . Alcohol Use: Yes     Comment: occasionally on football days    Review of Systems 10 Systems reviewed and are negative for acute change except as noted in the HPI. Allergies  Review of patient's allergies indicates no known allergies.  Home Medications   No current outpatient prescriptions on file. BP 144/87  Pulse 96  Temp(Src) 98.3 F (36.8 C) (Oral)  Resp 18  SpO2 98% Physical Exam  Nursing note and vitals reviewed. Constitutional:  Awake, alert, nontoxic appearance.  HENT:  Head: Atraumatic.  Eyes: Right eye exhibits no discharge. Left eye exhibits no discharge.  Neck: Neck supple.  Cardiovascular: Normal rate and  regular rhythm.   No murmur heard. Pulmonary/Chest: He is in respiratory distress. He has wheezes. He has no rales. He exhibits no tenderness.  Moderate respiratory distress, speaks short sentences, diffuse wheezes, mild retractions mild accessory muscle usage, pulse oximetry normal room air 97%  Abdominal: Soft. Bowel sounds are normal. He exhibits no distension and no mass. There is no tenderness. There is no rebound and no guarding.  Musculoskeletal: He exhibits no edema and no tenderness.  Baseline ROM, no obvious new focal weakness.  Neurological:  Mental status and motor strength appears baseline for patient and situation.  Skin: No rash noted.  Psychiatric: He has a normal mood and affect.    ED Course  Procedures (including critical care time) Still diffuse wheezing and dyspnea during 2nd continuous neb, so CXR and labs ordered will endorse care to Dr. Rubin Payor anticipate admit when CXR/labs back. 1550 Labs Review Labs Reviewed  CBC WITH DIFFERENTIAL - Abnormal; Notable for the following:    WBC 3.1 (*)    Platelets 111 (*)    Neutrophils Relative % 80 (*)    Monocytes Relative 2 (*)    Lymphs Abs 0.5 (*)    All other components within normal limits  POCT I-STAT, CHEM 8 - Abnormal; Notable for the following:    Potassium 3.2 (*)    Glucose, Bld 156 (*)    All other components within normal limits  BASIC METABOLIC PANEL  POCT I-STAT TROPONIN I   Imaging Review Dg  Chest 2 View  02/21/2013   CLINICAL DATA:  Shortness of breath.  Chest pain.  EXAM: CHEST  2 VIEW  COMPARISON:  Chest CT 08/08/2012.  FINDINGS: Minimal interstitial prominence noted in both lung fields. This is slightly more prominent than noted on prior chest CT. Mild pneumonitis cannot be excluded. Mediastinum and hilar structures normal. Heart size normal. No pleural effusion. No acute osseous abnormality.  IMPRESSION: Mild interstitial prominence bilaterally. Mild pneumonitis cannot be excluded.    Electronically Signed   By: Maisie Fus  Register   On: 02/21/2013 15:58    EKG Interpretation    Date/Time:  Friday February 21 2013 16:38:12 EST Ventricular Rate:  96 PR Interval:  186 QRS Duration: 95 QT Interval:  411 QTC Calculation: 519 R Axis:     Text Interpretation:  Sinus rhythm Biatrial enlargement Prolonged QT interval Confirmed by Rubin Payor  MD, NATHAN ([id number]) on 02/21/2013 5:30:52 PM            MDM   1. Asthma exacerbation   2. DVT (deep venous thrombosis), bilateral   3. Tobacco abuse   4. Hypokalemia    Anticipate admit.    Hurman Horn, MD 02/21/13 2017

## 2013-02-21 NOTE — Progress Notes (Signed)
   CARE MANAGEMENT ED NOTE 02/21/2013  Patient:  [name],[name]   Account Number:  [id number]  Date Initiated:  02/21/2013  Documentation initiated by:  Edd Arbour  Subjective/Objective Assessment:   52 yr old male medicare parts A & B, pt sob with ambulation for past week. HX asthma, had flare-up same time last year. Pt NAD. Pt has productive cough with white sputum, nasal congestion. Pt ambulatory to room.     Subjective/Objective Assessment Detail:   Consult from ED RN, Fleet Contras for pt questions about medicaid pcp dwight williams [facility]SS given duoneb tx and prednisone 60 mg po in [facility] ED  Pt voiced interest in finding a medicare accepting dentist or getting medicaid so he can see a medicaid dentist  all dental offices closed until 02/24/13 per automated voice messages pt updated     Action/Plan:   CM spoke with ED RN, Reviewed EPIC notes & spoke with pt Discussed with pt the differences between medicare parts A, B, D & medicaid, the need to have DSS assist him if he is interested in filing for medicaid   Action/Plan Detail:   [facility] financial counselors assist only if admitted to [facility] but still refer pts to DSS Pt given written list of medicare oral surgery & Maxillofacial Surgery providers within a 50 mile radius of his zip code [address] CM attempted to call officesx3   Anticipated DC Date:  02/21/2013     Status Recommendation to Physician:   Result of Recommendation:    Other ED Services  Consult Working Plan    DC Planning Services  Other  Outpatient Services - Pt will follow up    Choice offered to / List presented to:            Status of service:  Completed, signed off  ED Comments:  Provided pt with a list of medicaid [place] pcps, DSS contact number & address Pt voiced understanding of resources provided  ED Comments Detail:

## 2013-02-22 DIAGNOSIS — R109 Unspecified abdominal pain: Secondary | ICD-10-CM

## 2013-02-22 LAB — BASIC METABOLIC PANEL
BUN: 13 mg/dL (ref 6–23)
CO2: 22 mEq/L (ref 19–32)
Chloride: 104 mEq/L (ref 96–112)
Glucose, Bld: 123 mg/dL — ABNORMAL HIGH (ref 70–99)
Potassium: 4.3 mEq/L (ref 3.5–5.1)
Sodium: 136 mEq/L (ref 135–145)

## 2013-02-22 MED ORDER — LORATADINE 10 MG PO TABS
10.0000 mg | ORAL_TABLET | Freq: Every day | ORAL | Status: DC | PRN
  Administered 2013-02-22 – 2013-02-23 (×2): 10 mg via ORAL
  Filled 2013-02-22 (×2): qty 1

## 2013-02-22 MED ORDER — RIVAROXABAN (XARELTO) EDUCATION KIT FOR DVT/PE PATIENTS
PACK | Freq: Once | Status: AC
  Administered 2013-02-22: 11:00:00
  Filled 2013-02-22: qty 1

## 2013-02-22 MED ORDER — INFLUENZA VAC SPLIT QUAD 0.5 ML IM SUSP
0.5000 mL | INTRAMUSCULAR | Status: AC
  Administered 2013-02-22: 0.5 mL via INTRAMUSCULAR
  Filled 2013-02-22: qty 0.5

## 2013-02-22 NOTE — Progress Notes (Signed)
Utilization Review completed.  

## 2013-02-22 NOTE — Progress Notes (Signed)
[facility] PROGRESS NOTE  [name] ZOX:[id number] DOB: Oct 22, 1960 DOA: 02/21/2013 PCP: Jearld Lesch, MD  Assessment/Plan: 1. Asthma exacerbation - Patient improving on IV Solu-Medrol will continue at current dose - Continue Levaquin given history of COPD - Continue bronchodilators  2. hypoxia -At this point resolved and patient saturating well on room air - Continue above medications  3. history of DVT - Stable currently, we'll continue xarelto - We'll plan on reinforcing significance of compliance with this medication  4. tobacco abuse -Have reinforced the importance of tobacco cessation. Patient verbalizes understanding and agreement   Code Status: Full Family Communication: No family at bedside  Disposition Plan: Discharge with improvement in respiratory status, most likely tomorrow 02/23/2013   Consultants:  None  Procedures:  None  Antibiotics:  As listed above  HPI/Subjective: No new complaints. Patient still wheezing with cough  Objective: Filed Vitals:   02/22/13 0620  BP: 143/78  Pulse: 84  Temp: 98.2 F (36.8 C)  Resp: 18    Intake/Output Summary (Last 24 hours) at 02/22/13 1207 Last data filed at 02/22/13 8119  Gross per 24 hour  Intake      0 ml  Output    500 ml  Net   -500 ml   Filed Weights   02/22/13 0620  Weight: 72.4 kg (159 lb 9.8 oz)    Exam:   General:   Patient in no acute distress, alert and awake  Cardiovascular: Regular rate and rhythm, no murmurs rubs or gallops  Respiratory: Rales auscultated diffuse, expiratory wheezes, breathing comfortably on room air  Abdomen: Soft, nontender, nondistended  Musculoskeletal: No cyanosis, no clubbing  Data Reviewed: Basic Metabolic Panel:  Recent Labs Lab 02/21/13 1616 02/22/13 0455  NA 143 136  K 3.2* 4.3  CL 105 104  CO2  --  22  GLUCOSE 156* 123*  BUN 7 13  CREATININE 0.80 0.71  CALCIUM  --  9.2   Liver Function Tests: No results found for  this basename: AST, ALT, ALKPHOS, BILITOT, PROT, ALBUMIN,  in the last 168 hours No results found for this basename: LIPASE, AMYLASE,  in the last 168 hours No results found for this basename: AMMONIA,  in the last 168 hours CBC:  Recent Labs Lab 02/21/13 1605 02/21/13 1616  WBC 3.1*  --   NEUTROABS 2.5  --   HGB 13.8 16.3  HCT 40.6 48.0  MCV 93.8  --   PLT 111*  --    Cardiac Enzymes: No results found for this basename: CKTOTAL, CKMB, CKMBINDEX, TROPONINI,  in the last 168 hours BNP (last 3 results)  Recent Labs  04/01/12 0853  PROBNP 337.3*   CBG: No results found for this basename: GLUCAP,  in the last 168 hours  No results found for this or any previous visit (from the past 240 hour(s)).   Studies: Dg Chest 2 View  02/21/2013   CLINICAL DATA:  Shortness of breath.  Chest pain.  EXAM: CHEST  2 VIEW  COMPARISON:  Chest CT 08/08/2012.  FINDINGS: Minimal interstitial prominence noted in both lung fields. This is slightly more prominent than noted on prior chest CT. Mild pneumonitis cannot be excluded. Mediastinum and hilar structures normal. Heart size normal. No pleural effusion. No acute osseous abnormality.  IMPRESSION: Mild interstitial prominence bilaterally. Mild pneumonitis cannot be excluded.   Electronically Signed   By: Maisie Fus  Register   On: 02/21/2013 15:58    Scheduled Meds: . albuterol  2.5 mg Nebulization Q6H  .  ipratropium  0.5 mg Nebulization Q6H  . levofloxacin (LEVAQUIN) IV  750 mg Intravenous Q24H  . methylPREDNISolone (SOLU-MEDROL) injection  60 mg Intravenous Q12H  . pneumococcal 23 valent vaccine  0.5 mL Intramuscular Tomorrow-1000  . Rivaroxaban  15 mg Oral BID WC   Continuous Infusions:   Principal Problem:   Asthma exacerbation Active Problems:   DVT (deep venous thrombosis)    Time spent: > 35 minutes    Penny Pia  [facility] Pager [phone] If 7PM-7AM, please contact night-coverage at [email or web address], password  [facility] 02/22/2013, 12:07 PM  LOS: 1 day

## 2013-02-23 MED ORDER — IPRATROPIUM BROMIDE 0.02 % IN SOLN
0.5000 mg | Freq: Four times a day (QID) | RESPIRATORY_TRACT | Status: DC
  Administered 2013-02-23 – 2013-02-24 (×6): 0.5 mg via RESPIRATORY_TRACT
  Filled 2013-02-23 (×6): qty 2.5

## 2013-02-23 MED ORDER — ALBUTEROL SULFATE (5 MG/ML) 0.5% IN NEBU: 2.5000 mg | INHALATION_SOLUTION | RESPIRATORY_TRACT | Status: DC | PRN

## 2013-02-23 MED ORDER — ZOLPIDEM TARTRATE 5 MG PO TABS
5.0000 mg | ORAL_TABLET | Freq: Every day | ORAL | Status: DC
  Administered 2013-02-23: 5 mg via ORAL
  Filled 2013-02-23: qty 1

## 2013-02-23 MED ORDER — ALBUTEROL SULFATE (5 MG/ML) 0.5% IN NEBU
2.5000 mg | INHALATION_SOLUTION | Freq: Four times a day (QID) | RESPIRATORY_TRACT | Status: DC
  Administered 2013-02-23 – 2013-02-24 (×6): 2.5 mg via RESPIRATORY_TRACT
  Filled 2013-02-23 (×6): qty 0.5

## 2013-02-23 MED ORDER — OXYCODONE HCL 5 MG PO TABS: 5.0000 mg | ORAL_TABLET | Freq: Four times a day (QID) | ORAL | Status: DC | PRN

## 2013-02-23 MED ORDER — GUAIFENESIN-DM 100-10 MG/5ML PO SYRP: 5.0000 mL | ORAL_SOLUTION | ORAL | Status: DC | PRN

## 2013-02-23 MED ORDER — LEVOFLOXACIN 750 MG PO TABS
750.0000 mg | ORAL_TABLET | Freq: Every day | ORAL | Status: DC
  Administered 2013-02-23 – 2013-02-24 (×2): 750 mg via ORAL
  Filled 2013-02-23 (×2): qty 1

## 2013-02-23 MED ORDER — METHYLPREDNISOLONE SODIUM SUCC 125 MG IJ SOLR
60.0000 mg | Freq: Three times a day (TID) | INTRAMUSCULAR | Status: DC
  Administered 2013-02-23 – 2013-02-24 (×3): 60 mg via INTRAVENOUS
  Filled 2013-02-23 (×6): qty 0.96

## 2013-02-23 NOTE — Progress Notes (Signed)
[facility] PROGRESS NOTE  [name] ZOX:[id number] DOB: [birth year] DOA: 02/21/2013 PCP: Jearld Lesch, MD  Assessment/Plan: 1. Asthma exacerbation Not much difference from prior, will increase Solu-Medrol to every 8 hours - Continue Levaquin given history of COPD - Continue bronchodilators  2. hypoxia -At this point resolved and patient saturating well on room air - Continue above medications  3. history of DVT - Stable currently, we'll continue xarelto - We'll plan on reinforcing significance of compliance with this medication  4. tobacco abuse -Have reinforced the importance of tobacco cessation. Patient verbalizes understanding and agreement   Code Status: Full Family Communication: Message left with family Disposition Plan: Hopefully if improvement, discharged the next day or 2   Consultants:  None  Procedures:  None  Antibiotics:  Levaquin 11/28- present  HPI/Subjective: Patient still having significant amount of wheezing with nonproductive cough, mild shortness of breath. No improvement from yesterday  Objective: Filed Vitals:   02/23/13 1504  BP: 142/84  Pulse: 77  Temp: 97.9 F (36.6 C)  Resp: 18    Intake/Output Summary (Last 24 hours) at 02/23/13 1557 Last data filed at 02/23/13 8119  Gross per 24 hour  Intake    240 ml  Output    100 ml  Net    140 ml   Filed Weights   02/22/13 0620  Weight: 72.4 kg (159 lb 9.8 oz)    Exam:   General:   Patient in no acute distress, alert and awake  Cardiovascular: Regular rate and rhythm, no murmurs rubs or gallops  Respiratory: Bilateral expiratory wheezing  Abdomen: Soft, nontender, nondistended  Musculoskeletal: No cyanosis, no clubbing  Data Reviewed: Basic Metabolic Panel:  Recent Labs Lab 02/21/13 1616 02/22/13 0455  NA 143 136  K 3.2* 4.3  CL 105 104  CO2  --  22  GLUCOSE 156* 123*  BUN 7 13  CREATININE 0.80 0.71  CALCIUM  --  9.2   Liver Function  Tests: No results found for this basename: AST, ALT, ALKPHOS, BILITOT, PROT, ALBUMIN,  in the last 168 hours No results found for this basename: LIPASE, AMYLASE,  in the last 168 hours No results found for this basename: AMMONIA,  in the last 168 hours CBC:  Recent Labs Lab 02/21/13 1605 02/21/13 1616  WBC 3.1*  --   NEUTROABS 2.5  --   HGB 13.8 16.3  HCT 40.6 48.0  MCV 93.8  --   PLT 111*  --    Cardiac Enzymes: No results found for this basename: CKTOTAL, CKMB, CKMBINDEX, TROPONINI,  in the last 168 hours BNP (last 3 results)  Recent Labs  04/01/12 0853  PROBNP 337.3*   CBG: No results found for this basename: GLUCAP,  in the last 168 hours  No results found for this or any previous visit (from the past 240 hour(s)).   Studies: No results found.  Scheduled Meds: . albuterol  2.5 mg Nebulization Q6H WA  . ipratropium  0.5 mg Nebulization Q6H WA  . levofloxacin  750 mg Oral Daily  . methylPREDNISolone (SOLU-MEDROL) injection  60 mg Intravenous Q8H  . pneumococcal 23 valent vaccine  0.5 mL Intramuscular Tomorrow-1000  . Rivaroxaban  15 mg Oral BID WC  . zolpidem  5 mg Oral QHS   Continuous Infusions:   Principal Problem:   Asthma exacerbation Active Problems:   Hypoxia   Tobacco abuse   DVT (deep venous thrombosis)    Time spent: 25 minutes    Gentry Pilson K  [facility] Pager [phone] If 7PM-7AM, please contact night-coverage at [email or web address], password [facility] 02/23/2013, 3:57 PM  LOS: 2 days

## 2013-02-23 NOTE — Progress Notes (Signed)
This patient is receiving IV Levaquin Based on criteria approved by the Pharmacy and Therapeutics Committee, this medication is being converted to the equivalent oral dose form. These criteria include:   . The patient is eating (either orally or per tube) and/or has been taking other orally administered medications for at least 24 hours.  . This patient has no evidence of active gastrointestinal bleeding or impaired GI absorption (gastrectomy, short bowel, patient on TNA or NPO).  >24 hours OR taking other scheduled oral medications for >24 hours).  Expected plan for continued treatment for at least 1 day.  Has documented clinical improvement; the patient must demonstrate:  24-hour maximum temperature of ?< 100.5? F  MD/patient assessment of improvement   If you have questions about this conversion, please contact the pharmacy department  Malaak Stach, Loma Messing PharmD Pager #: [phone] 12:54 PM 02/23/2013    .

## 2013-02-24 DIAGNOSIS — R0902 Hypoxemia: Secondary | ICD-10-CM

## 2013-02-24 DIAGNOSIS — D72819 Decreased white blood cell count, unspecified: Secondary | ICD-10-CM

## 2013-02-24 DIAGNOSIS — J45909 Unspecified asthma, uncomplicated: Secondary | ICD-10-CM

## 2013-02-24 LAB — CBC
MCH: 31.8 pg (ref 26.0–34.0)
MCHC: 33.4 g/dL (ref 30.0–36.0)
MCV: 95.1 fL (ref 78.0–100.0)
Platelets: 122 10*3/uL — ABNORMAL LOW (ref 150–400)
RBC: 3.65 MIL/uL — ABNORMAL LOW (ref 4.22–5.81)
RDW: 14.7 % (ref 11.5–15.5)

## 2013-02-24 LAB — BASIC METABOLIC PANEL
CO2: 23 mEq/L (ref 19–32)
Calcium: 8.8 mg/dL (ref 8.4–10.5)
Chloride: 106 mEq/L (ref 96–112)
Creatinine, Ser: 0.71 mg/dL (ref 0.50–1.35)
GFR calc Af Amer: >90 mL/min (ref >90)
GFR calc non Af Amer: >90 mL/min (ref >90)
Sodium: 138 mEq/L (ref 135–145)

## 2013-02-24 MED ORDER — ALBUTEROL SULFATE HFA 108 (90 BASE) MCG/ACT IN AERS: 2.0000 | INHALATION_SPRAY | Freq: Four times a day (QID) | RESPIRATORY_TRACT | Status: DC | PRN

## 2013-02-24 MED ORDER — LEVOFLOXACIN 750 MG PO TABS: 750.0000 mg | ORAL_TABLET | Freq: Every day | ORAL | Status: DC

## 2013-02-24 MED ORDER — PREDNISONE 50 MG PO TABS: 50.0000 mg | ORAL_TABLET | Freq: Every day | ORAL | Status: DC

## 2013-02-24 MED ORDER — RIVAROXABAN 15 MG PO TABS: 15.0000 mg | ORAL_TABLET | Freq: Two times a day (BID) | ORAL | Status: DC

## 2013-02-24 NOTE — Discharge Summary (Signed)
Physician Discharge Summary  [name] WGN:[id number] DOB: 08/29/1960 DOA: 02/21/2013  PCP: Jearld Lesch, MD  Admit date: 02/21/2013 Discharge date: 02/24/2013  Time spent: > 35 minutes  Recommendations for Outpatient Follow-up:  1. Please continue to encourage tobacco cessation 2. Repeat H&H as outpatient 3. Patient restarted back on xarelto, continue to reinforce significance of being on anticoagulant given his history of DVT  Discharge Diagnoses:  Principal Problem:   Asthma exacerbation Active Problems:   Hypoxia   Tobacco abuse   DVT (deep venous thrombosis)   Discharge Condition: Stable  Diet recommendation: Regular  Filed Weights   02/22/13 0620  Weight: 72.4 kg (159 lb 9.8 oz)    History of present illness:  Patient is a 52 year old African American male with history of COPD who presented to the ED complaining of shortness of breath.  Hospital Course:   1. Asthma exacerbation Not much difference from prior, will increase Solu-Medrol to every 8 hours  - Continue Levaquin for 2 more days - Will discharge with prescription for albuterol, a shunt to continue home dose of Symbicort  2. hypoxia  -At this point resolved and patient saturating well on room air  - was secondary to #1  3. history of DVT  - Stable currently, we'll continue xarelto  - Will have patient followup with primary care physician for further evaluation and recommendations  4. tobacco abuse  -Have reinforced the importance of tobacco cessation. Patient verbalizes understanding and agreement      Procedures:  None  Consultations:  None  Discharge Exam: Filed Vitals:   02/24/13 0656  BP: 153/88  Pulse: 64  Temp: 97.6 F (36.4 C)  Resp: 18    General: Pt in NAD, alert and awake Cardiovascular: RRR, no MRG Respiratory: CTA BL, no wheezes, Breathing comfortably on room air.  Discharge Instructions     Medication List         albuterol 108 (90 BASE) MCG/ACT  inhaler  Commonly known as:  PROVENTIL HFA;VENTOLIN HFA  Inhale 2 puffs into the lungs every 6 (six) hours as needed for wheezing or shortness of breath.     budesonide-formoterol 160-4.5 MCG/ACT inhaler  Commonly known as:  SYMBICORT  Inhale 2 puffs into the lungs daily as needed.     levofloxacin 750 MG tablet  Commonly known as:  LEVAQUIN  Take 1 tablet (750 mg total) by mouth daily.     predniSONE 50 MG tablet  Commonly known as:  DELTASONE  Take 1 tablet (50 mg total) by mouth daily with breakfast.     Rivaroxaban 15 MG Tabs tablet  Commonly known as:  XARELTO  Take 1 tablet (15 mg total) by mouth 2 (two) times daily with a meal.     traMADol 50 MG tablet  Commonly known as:  ULTRAM  Take 1 tablet (50 mg total) by mouth every 6 (six) hours as needed.       No Known Allergies    The results of significant diagnostics from this hospitalization (including imaging, microbiology, ancillary and laboratory) are listed below for reference.    Significant Diagnostic Studies: Dg Chest 2 View  02/21/2013   CLINICAL DATA:  Shortness of breath.  Chest pain.  EXAM: CHEST  2 VIEW  COMPARISON:  Chest CT 08/08/2012.  FINDINGS: Minimal interstitial prominence noted in both lung fields. This is slightly more prominent than noted on prior chest CT. Mild pneumonitis cannot be excluded. Mediastinum and hilar structures normal. Heart size normal. No pleural effusion.  No acute osseous abnormality.  IMPRESSION: Mild interstitial prominence bilaterally. Mild pneumonitis cannot be excluded.   Electronically Signed   By: Maisie Fus  Register   On: 02/21/2013 15:58   Ct Head Wo Contrast  02/03/2013   CLINICAL DATA:  Fall. Trauma to the head and neck.  EXAM: CT HEAD WITHOUT CONTRAST  CT CERVICAL SPINE WITHOUT CONTRAST  TECHNIQUE: Multidetector CT imaging of the head and cervical spine was performed following the standard protocol without intravenous contrast. Multiplanar CT image reconstructions of the  cervical spine were also generated.  COMPARISON:  05/27/2012  FINDINGS: CT HEAD FINDINGS  No sign of acute infarction, mass lesion, hemorrhage, hydrocephalus or extra-axial collection. A right frontal white matter low density is unchanged since previous studies. The calvarium is unremarkable. There are inflammatory changes of the maxillary and ethmoid sinuses.  CT CERVICAL SPINE FINDINGS  Alignment is normal. No fracture. No significant degenerative changes. Soft tissues of the neck appear unremarkable.  IMPRESSION: Normal CT scan of the cervical spine.  No acute intracranial finding. Chronic white matter infarction in the right frontal lobe.   Electronically Signed   By: Paulina Fusi M.D.   On: 02/03/2013 20:28   Ct Cervical Spine Wo Contrast  02/03/2013   CLINICAL DATA:  Fall. Trauma to the head and neck.  EXAM: CT HEAD WITHOUT CONTRAST  CT CERVICAL SPINE WITHOUT CONTRAST  TECHNIQUE: Multidetector CT imaging of the head and cervical spine was performed following the standard protocol without intravenous contrast. Multiplanar CT image reconstructions of the cervical spine were also generated.  COMPARISON:  05/27/2012  FINDINGS: CT HEAD FINDINGS  No sign of acute infarction, mass lesion, hemorrhage, hydrocephalus or extra-axial collection. A right frontal white matter low density is unchanged since previous studies. The calvarium is unremarkable. There are inflammatory changes of the maxillary and ethmoid sinuses.  CT CERVICAL SPINE FINDINGS  Alignment is normal. No fracture. No significant degenerative changes. Soft tissues of the neck appear unremarkable.  IMPRESSION: Normal CT scan of the cervical spine.  No acute intracranial finding. Chronic white matter infarction in the right frontal lobe.   Electronically Signed   By: Paulina Fusi M.D.   On: 02/03/2013 20:28    Microbiology: No results found for this or any previous visit (from the past 240 hour(s)).   Labs: Basic Metabolic Panel:  Recent  Labs Lab 02/21/13 1616 02/22/13 0455 02/24/13 0505  NA 143 136 138  K 3.2* 4.3 4.1  CL 105 104 106  CO2  --  22 23  GLUCOSE 156* 123* 178*  BUN 7 13 21   CREATININE 0.80 0.71 0.71  CALCIUM  --  9.2 8.8   Liver Function Tests: No results found for this basename: AST, ALT, ALKPHOS, BILITOT, PROT, ALBUMIN,  in the last 168 hours No results found for this basename: LIPASE, AMYLASE,  in the last 168 hours No results found for this basename: AMMONIA,  in the last 168 hours CBC:  Recent Labs Lab 02/21/13 1605 02/21/13 1616 02/24/13 0505  WBC 3.1*  --  3.2*  NEUTROABS 2.5  --   --   HGB 13.8 16.3 11.6*  HCT 40.6 48.0 34.7*  MCV 93.8  --  95.1  PLT 111*  --  122*   Cardiac Enzymes: No results found for this basename: CKTOTAL, CKMB, CKMBINDEX, TROPONINI,  in the last 168 hours BNP: BNP (last 3 results)  Recent Labs  04/01/12 0853  PROBNP 337.3*   CBG: No results found for this basename: GLUCAP,  in the last 168 hours     Signed:  Penny Pia  [facility] 02/24/2013, 11:37 AM

## 2013-02-24 NOTE — Progress Notes (Signed)
Pt refused being set up on [email or web address]

## 2013-03-25 ENCOUNTER — Inpatient Hospital Stay (HOSPITAL_COMMUNITY)
Admission: EM | Admit: 2013-03-25 | Discharge: 2013-03-28 | DRG: 190 | Disposition: A | Discharge status: Home / Self Care | Payer: Medicare Other | Attending: Internal Medicine | Admitting: Internal Medicine

## 2013-03-25 ENCOUNTER — Encounter (HOSPITAL_COMMUNITY): Payer: Self-pay | Admitting: Emergency Medicine

## 2013-03-25 ENCOUNTER — Emergency Department (HOSPITAL_COMMUNITY): Payer: Medicare Other

## 2013-03-25 DIAGNOSIS — D696 Thrombocytopenia, unspecified: Secondary | ICD-10-CM | POA: Diagnosis present

## 2013-03-25 DIAGNOSIS — Z91199 Patient's noncompliance with other medical treatment and regimen due to unspecified reason: Secondary | ICD-10-CM

## 2013-03-25 DIAGNOSIS — Z79899 Other long term (current) drug therapy: Secondary | ICD-10-CM

## 2013-03-25 DIAGNOSIS — Z825 Family history of asthma and other chronic lower respiratory diseases: Secondary | ICD-10-CM

## 2013-03-25 DIAGNOSIS — F1020 Alcohol dependence, uncomplicated: Secondary | ICD-10-CM | POA: Diagnosis present

## 2013-03-25 DIAGNOSIS — I82409 Acute embolism and thrombosis of unspecified deep veins of unspecified lower extremity: Secondary | ICD-10-CM | POA: Diagnosis present

## 2013-03-25 DIAGNOSIS — Z9119 Patient's noncompliance with other medical treatment and regimen: Secondary | ICD-10-CM

## 2013-03-25 DIAGNOSIS — J441 Chronic obstructive pulmonary disease with (acute) exacerbation: Principal | ICD-10-CM | POA: Diagnosis present

## 2013-03-25 DIAGNOSIS — Z86711 Personal history of pulmonary embolism: Secondary | ICD-10-CM

## 2013-03-25 DIAGNOSIS — I82401 Acute embolism and thrombosis of unspecified deep veins of right lower extremity: Secondary | ICD-10-CM

## 2013-03-25 DIAGNOSIS — Z86718 Personal history of other venous thrombosis and embolism: Secondary | ICD-10-CM | POA: Diagnosis present

## 2013-03-25 DIAGNOSIS — J45909 Unspecified asthma, uncomplicated: Secondary | ICD-10-CM

## 2013-03-25 DIAGNOSIS — J9600 Acute respiratory failure, unspecified whether with hypoxia or hypercapnia: Secondary | ICD-10-CM

## 2013-03-25 DIAGNOSIS — R0902 Hypoxemia: Secondary | ICD-10-CM

## 2013-03-25 DIAGNOSIS — F17200 Nicotine dependence, unspecified, uncomplicated: Secondary | ICD-10-CM | POA: Diagnosis present

## 2013-03-25 DIAGNOSIS — Z720 Tobacco use: Secondary | ICD-10-CM | POA: Diagnosis present

## 2013-03-25 DIAGNOSIS — D72819 Decreased white blood cell count, unspecified: Secondary | ICD-10-CM | POA: Diagnosis present

## 2013-03-25 DIAGNOSIS — R0789 Other chest pain: Secondary | ICD-10-CM | POA: Diagnosis present

## 2013-03-25 LAB — BASIC METABOLIC PANEL
BUN: 9 mg/dL (ref 6–23)
CO2: 19 mEq/L (ref 19–32)
Calcium: 9.8 mg/dL (ref 8.4–10.5)
Creatinine, Ser: 0.7 mg/dL (ref 0.50–1.35)
GFR calc Af Amer: >90 mL/min (ref >90)
GFR calc non Af Amer: >90 mL/min (ref >90)
Glucose, Bld: 102 mg/dL — ABNORMAL HIGH (ref 70–99)

## 2013-03-25 LAB — TROPONIN I: Troponin I: <0.3 ng/mL (ref <0.30)

## 2013-03-25 LAB — CBC WITH DIFFERENTIAL/PLATELET
Basophils Absolute: 0 10*3/uL (ref 0.0–0.1)
Basophils Relative: 0 % (ref 0–1)
Eosinophils Absolute: 0.2 10*3/uL (ref 0.0–0.7)
HCT: 42.9 % (ref 39.0–52.0)
Hemoglobin: 14.6 g/dL (ref 13.0–17.0)
Lymphs Abs: 1.6 10*3/uL (ref 0.7–4.0)
MCH: 31.4 pg (ref 26.0–34.0)
MCHC: 34 g/dL (ref 30.0–36.0)
MCV: 92.3 fL (ref 78.0–100.0)
Monocytes Absolute: 0.4 10*3/uL (ref 0.1–1.0)
Neutro Abs: 0.8 10*3/uL — ABNORMAL LOW (ref 1.7–7.7)
RDW: 12.9 % (ref 11.5–15.5)

## 2013-03-25 LAB — PROTIME-INR
INR: 0.94 (ref 0.00–1.49)
Prothrombin Time: 12.4 seconds (ref 11.6–15.2)

## 2013-03-25 LAB — MAGNESIUM: Magnesium: 1.9 mg/dL (ref 1.5–2.5)

## 2013-03-25 MED ORDER — ACETAMINOPHEN 325 MG PO TABS: 650.0000 mg | ORAL_TABLET | Freq: Four times a day (QID) | ORAL | Status: DC | PRN

## 2013-03-25 MED ORDER — METHYLPREDNISOLONE SODIUM SUCC 125 MG IJ SOLR
60.0000 mg | Freq: Four times a day (QID) | INTRAMUSCULAR | Status: AC
  Administered 2013-03-25 – 2013-03-27 (×8): 60 mg via INTRAVENOUS
  Administered 2013-03-27: 18:00:00 via INTRAVENOUS
  Filled 2013-03-25 (×11): qty 0.96

## 2013-03-25 MED ORDER — ONDANSETRON HCL 4 MG PO TABS: 4.0000 mg | ORAL_TABLET | Freq: Four times a day (QID) | ORAL | Status: DC | PRN

## 2013-03-25 MED ORDER — ONDANSETRON HCL 4 MG/2ML IJ SOLN: 4.0000 mg | Freq: Four times a day (QID) | INTRAMUSCULAR | Status: DC | PRN

## 2013-03-25 MED ORDER — METHYLPREDNISOLONE SODIUM SUCC 125 MG IJ SOLR
125.0000 mg | Freq: Once | INTRAMUSCULAR | Status: AC
  Administered 2013-03-25: 125 mg via INTRAVENOUS
  Filled 2013-03-25: qty 2

## 2013-03-25 MED ORDER — THIAMINE HCL 100 MG/ML IJ SOLN
100.0000 mg | Freq: Every day | INTRAMUSCULAR | Status: DC
Start: 2013-03-25 — End: 2013-03-28
  Filled 2013-03-25 (×4): qty 1

## 2013-03-25 MED ORDER — ADULT MULTIVITAMIN W/MINERALS CH
1.0000 | ORAL_TABLET | Freq: Every day | ORAL | Status: DC
Start: 2013-03-25 — End: 2013-03-28
  Administered 2013-03-25 – 2013-03-27 (×3): 1 via ORAL
  Filled 2013-03-25 (×4): qty 1

## 2013-03-25 MED ORDER — FOLIC ACID 1 MG PO TABS
1.0000 mg | ORAL_TABLET | Freq: Every day | ORAL | Status: DC
  Administered 2013-03-25 – 2013-03-27 (×3): 1 mg via ORAL
  Filled 2013-03-25 (×4): qty 1

## 2013-03-25 MED ORDER — IPRATROPIUM-ALBUTEROL 0.5-2.5 (3) MG/3ML IN SOLN
3.0000 mL | RESPIRATORY_TRACT | Status: DC
  Administered 2013-03-25 – 2013-03-27 (×12): 3 mL via RESPIRATORY_TRACT
  Filled 2013-03-25 (×54): qty 3

## 2013-03-25 MED ORDER — HYDROCODONE-ACETAMINOPHEN 5-325 MG PO TABS
2.0000 | ORAL_TABLET | Freq: Once | ORAL | Status: AC
  Administered 2013-03-25: 2 via ORAL
  Filled 2013-03-25: qty 2

## 2013-03-25 MED ORDER — ALBUTEROL SULFATE (5 MG/ML) 0.5% IN NEBU
5.0000 mg | INHALATION_SOLUTION | Freq: Once | RESPIRATORY_TRACT | Status: AC
  Administered 2013-03-25: 5 mg via RESPIRATORY_TRACT
  Filled 2013-03-25: qty 6

## 2013-03-25 MED ORDER — ACETAMINOPHEN 650 MG RE SUPP: 650.0000 mg | Freq: Four times a day (QID) | RECTAL | Status: DC | PRN

## 2013-03-25 MED ORDER — VITAMIN B-1 100 MG PO TABS
100.0000 mg | ORAL_TABLET | Freq: Every day | ORAL | Status: DC
  Administered 2013-03-25 – 2013-03-27 (×3): 100 mg via ORAL
  Filled 2013-03-25 (×4): qty 1

## 2013-03-25 MED ORDER — LEVOFLOXACIN 750 MG PO TABS
750.0000 mg | ORAL_TABLET | Freq: Every day | ORAL | Status: DC
  Administered 2013-03-25 – 2013-03-27 (×3): 750 mg via ORAL
  Filled 2013-03-25 (×4): qty 1

## 2013-03-25 MED ORDER — LORAZEPAM 2 MG/ML IJ SOLN
1.0000 mg | Freq: Four times a day (QID) | INTRAMUSCULAR | Status: DC | PRN
  Administered 2013-03-25 – 2013-03-26 (×2): 1 mg via INTRAVENOUS
  Filled 2013-03-25 (×2): qty 1

## 2013-03-25 MED ORDER — HYDROCODONE-ACETAMINOPHEN 5-325 MG PO TABS
1.0000 | ORAL_TABLET | ORAL | Status: DC | PRN
  Administered 2013-03-25 – 2013-03-27 (×5): 1 via ORAL
  Filled 2013-03-25 (×5): qty 1

## 2013-03-25 MED ORDER — SODIUM CHLORIDE 0.9 % IJ SOLN
3.0000 mL | Freq: Two times a day (BID) | INTRAMUSCULAR | Status: DC
  Administered 2013-03-25 – 2013-03-27 (×4): 3 mL via INTRAVENOUS

## 2013-03-25 MED ORDER — RIVAROXABAN 20 MG PO TABS
20.0000 mg | ORAL_TABLET | Freq: Every day | ORAL | Status: DC
  Administered 2013-03-25 – 2013-03-27 (×3): 20 mg via ORAL
  Filled 2013-03-25 (×4): qty 1

## 2013-03-25 MED ORDER — LORAZEPAM 1 MG PO TABS
1.0000 mg | ORAL_TABLET | Freq: Four times a day (QID) | ORAL | Status: DC | PRN
  Administered 2013-03-26 – 2013-03-27 (×6): 1 mg via ORAL
  Filled 2013-03-25 (×6): qty 1

## 2013-03-25 MED ORDER — ALBUTEROL (5 MG/ML) CONTINUOUS INHALATION SOLN
10.0000 mg/h | INHALATION_SOLUTION | RESPIRATORY_TRACT | Status: AC
  Administered 2013-03-25: 10 mg/h via RESPIRATORY_TRACT

## 2013-03-25 NOTE — ED Provider Notes (Signed)
Medical screening examination/treatment/procedure(s) were performed by non-physician practitioner and as supervising physician I was immediately available for consultation/collaboration.  EKG Interpretation    Date/Time:    Ventricular Rate:    PR Interval:    QRS Duration:   QT Interval:    QTC Calculation:   R Axis:     Text Interpretation:                Layla Maw Paije Goodhart, DO 03/25/13 1625

## 2013-03-25 NOTE — ED Notes (Signed)
Pt from home reports asthma with cough x2 weeks. Pt has been using albuterol inhaler at home w/o relief. Pt denies N/V/D. Pt adds that he has bilateral leg pain from hx of blood clots. Pt takes Xarelto daily. Pt is A&O and in NAD.

## 2013-03-25 NOTE — H&P (Signed)
[facility] History and Physical  [name] WUJ:[id number] DOB: 12/04/1960 DOA: 03/25/2013   PCP: Jearld Lesch, MD   Chief Complaint: Shortness of breath and coughing  HPI:  52 year old male with a history of recurrent DVT, PE with IVC filter on xarelto, COPD with continued tobacco use and alcohol use presents with one-week history of worsening shortness of breath. The patient tried to use multiple doses of his albuterol MDI which did not help his shortness of breath. X-rays are taken to the ED for further evaluation. He complains of increasing cough with yellow sputum. He denies any fevers, chills, hemoptysis, nausea, vomiting, diarrhea, abdominal pain, dysuria, hematuria, hematochezia. He complains of chest discomfort with coughing. The patient was discharged from the hospital on 02/24/2013 after an admission for COPD exacerbation. The patient never picked up his prescription for prednisone or levofloxacin. The patient endorses compliance with rivaroxaban. In the ED, the patient received intravenous IV solu-Medrol and continuous nebulizers. However the patient remained hypoxemic into the 70s with ambulation. As a result admission was advised. Assessment/Plan: Acute respiratory failure -Secondary to COPD exacerbation COPD exacerbation -Continue intravenous steroids -Levofloxacin 750 mg daily - Aerosolized albuterol and Atrovent Tobacco and alcohol dependence and use -Tobacco cessation discussed -Placed on alcohol withdrawal protocol -Last drink was 2 days ago -States that he drinks 1 pint of liquor weekly and 2x40ounces beer/wk Atypical chest pain -Likely related to the patient's coughing and COPD exacerbation -Cycle troponins -Check EKG History of recurrent DVT and PE -Patient endorses compliance with rivaroxaban -Continue rivaroxaban Thrombocytopenia -Hemoglobin is stable -Appears to be chronic -Continue to monitor      Past Medical History  Diagnosis Date   . Asthma   . DVT (deep venous thrombosis)   . Pulmonary embolism    Past Surgical History  Procedure Laterality Date  . Ivc filter     Social History:  reports that he has been smoking Cigarettes.  He has a 6 pack-year smoking history. He has never used smokeless tobacco. He reports that he drinks alcohol. He reports that he does not use illicit drugs.   Family History  Problem Relation Age of Onset  . Asthma Mother   . Allergies Mother   . Allergies Sister   . Allergies Brother      No Known Allergies    Prior to Admission medications   Medication Sig Start Date End Date Taking? Authorizing Provider  Rivaroxaban (XARELTO) 15 MG TABS tablet Take 1 tablet (15 mg total) by mouth 2 (two) times daily with a meal. 02/24/13  Yes Penny Pia, MD  albuterol (PROVENTIL HFA;VENTOLIN HFA) 108 (90 BASE) MCG/ACT inhaler Inhale 2 puffs into the lungs every 6 (six) hours as needed for wheezing or shortness of breath. 02/24/13   Penny Pia, MD  levofloxacin (LEVAQUIN) 750 MG tablet Take 1 tablet (750 mg total) by mouth daily. 02/24/13   Penny Pia, MD  predniSONE (DELTASONE) 50 MG tablet Take 1 tablet (50 mg total) by mouth daily with breakfast. 02/24/13   Penny Pia, MD    Review of Systems:  Constitutional:  No weight loss, night sweats, Fevers, chills, fatigue.  Head&Eyes: No headache.  No vision loss.  No eye pain or scotoma ENT:  No Difficulty swallowing,Tooth/dental problems,Sore throat,    Cardio-vascular:  NoOrthopnea, PND, swelling in lower extremities,  dizziness, palpitations  GI:  No  abdominal pain, nausea, vomiting, diarrhea, loss of appetite, hematochezia, melena, heartburn, indigestion, Resp:  Complains of coughing, wheezing, sputum production. Skin:  Multiple boils on  his back GU:  no dysuria, change in color of urine, no urgency or frequency. No flank pain.  Musculoskeletal:  No joint pain or swelling. No decreased range of motion. No back pain.  Psych:  No  change in mood or affect. No depression or anxiety. Neurologic: No headache, no dysesthesia, no focal weakness, no vision loss. No syncope  Physical Exam: Filed Vitals:   03/25/13 0957 03/25/13 1416  BP: 122/74   Pulse: 115   Temp: 98.3 F (36.8 C)   TempSrc: Oral   Resp: 18   SpO2: 99% 77%   General:  A&O x 3, NAD, nontoxic, pleasant/cooperative Head/Eye: No conjunctival hemorrhage, no icterus, [place]/AT, No nystagmus ENT:  No icterus,  No thrush, edentulous, no pharyngeal exudate Neck:  No masses, no lymphadenpathy, no bruits CV:  RRR, no rub, no gallop, no S3 Lung:  Bilateral scattered rhonchi and wheezing. Abdomen: soft/NT, +BS, nondistended, no peritoneal signs Ext: No cyanosis, No rashes, No petechiae, No lymphangitis, 1+LE edema   Labs on Admission:  Basic Metabolic Panel:  Recent Labs Lab 03/25/13 1155  NA 142  K 4.4  CL 102  CO2 19  GLUCOSE 102*  BUN 9  CREATININE 0.70  CALCIUM 9.8   Liver Function Tests: No results found for this basename: AST, ALT, ALKPHOS, BILITOT, PROT, ALBUMIN,  in the last 168 hours No results found for this basename: LIPASE, AMYLASE,  in the last 168 hours No results found for this basename: AMMONIA,  in the last 168 hours CBC:  Recent Labs Lab 03/25/13 1155  WBC 3.0*  NEUTROABS 0.8*  HGB 14.6  HCT 42.9  MCV 92.3  PLT 114*   Cardiac Enzymes: No results found for this basename: CKTOTAL, CKMB, CKMBINDEX, TROPONINI,  in the last 168 hours BNP: No components found with this basename: POCBNP,  CBG: No results found for this basename: GLUCAP,  in the last 168 hours  Radiological Exams on Admission: Dg Chest 2 View (if Patient Has Fever And/or Copd)  03/25/2013   CLINICAL DATA:  Chest pain  EXAM: CHEST  2 VIEW  COMPARISON:  02/21/2013  FINDINGS: Cardiomediastinal silhouette is stable. No acute infiltrate or pleural effusion. Mild perihilar interstitial prominence without pulmonary edema.  IMPRESSION: No acute infiltrate or  pleural effusion. Mild perihilar interstitial prominence without pulmonary edema.   Electronically Signed   By: Natasha Mead M.D.   On: 03/25/2013 10:37    EKG: Independently reviewed. pending    Time spent:50 minutes Code Status:   FULL Family Communication:   No Family at bedside   Pattiann Solanki, DO  [facility] Pager [phone]  If 7PM-7AM, please contact night-coverage [email or web address] Password [email or web address] 03/25/2013, 2:48 PM

## 2013-03-25 NOTE — ED Provider Notes (Signed)
CSN: [id number]     Arrival date & time 03/25/13  4098 History   First MD Initiated Contact with Patient 03/25/13 1000     Chief Complaint  Patient presents with  . Asthma   (Consider location/radiation/quality/duration/timing/severity/associated sxs/prior Treatment) HPI Comments: Patient presents to the emergency department with chief complaint of asthma and cough x2 weeks. He states that his symptoms have been progressively worsening. He is been trying to use his inhaler with some relief. States that he has not come in earlier, because he did not want to be admitted or Christmas. He states that he has a history of DVT and PE. He is taking Xeralto daily.  He denies fever, chills, hemoptosis, n/v/d.  The history is provided by the patient. No language interpreter was used.    Past Medical History  Diagnosis Date  . Asthma   . DVT (deep venous thrombosis)   . Pulmonary embolism    History reviewed. No pertinent past surgical history. Family History  Problem Relation Age of Onset  . Asthma Mother   . Allergies Mother   . Allergies Sister   . Allergies Brother    History  Substance Use Topics  . Smoking status: Current Some Day Smoker -- 0.20 packs/day for 30 years    Types: Cigarettes  . Smokeless tobacco: Never Used  . Alcohol Use: Yes     Comment: occasionally on football days    Review of Systems  All other systems reviewed and are negative.    Allergies  Review of patient's allergies indicates no known allergies.  Home Medications   Current Outpatient Rx  Name  Route  Sig  Dispense  Refill  . albuterol (PROVENTIL HFA;VENTOLIN HFA) 108 (90 BASE) MCG/ACT inhaler   Inhalation   Inhale 2 puffs into the lungs every 6 (six) hours as needed for wheezing or shortness of breath.   1 Inhaler   0   . budesonide-formoterol (SYMBICORT) 160-4.5 MCG/ACT inhaler   Inhalation   Inhale 2 puffs into the lungs daily as needed.         Marland Kitchen levofloxacin (LEVAQUIN) 750 MG  tablet   Oral   Take 1 tablet (750 mg total) by mouth daily.   2 tablet   0   . predniSONE (DELTASONE) 50 MG tablet   Oral   Take 1 tablet (50 mg total) by mouth daily with breakfast.   2 tablet   0   . Rivaroxaban (XARELTO) 15 MG TABS tablet   Oral   Take 1 tablet (15 mg total) by mouth 2 (two) times daily with a meal.   30 tablet   0   . traMADol (ULTRAM) 50 MG tablet   Oral   Take 1 tablet (50 mg total) by mouth every 6 (six) hours as needed.   15 tablet   0    BP 122/74  Pulse 115  Temp(Src) 98.3 F (36.8 C) (Oral)  Resp 18  SpO2 99% Physical Exam  Nursing note and vitals reviewed. Constitutional: He is oriented to person, place, and time. He appears well-developed and well-nourished.  HENT:  Head: Normocephalic and atraumatic.  Eyes: Conjunctivae and EOM are normal. Pupils are equal, round, and reactive to light. Right eye exhibits no discharge. Left eye exhibits no discharge. No scleral icterus.  Neck: Normal range of motion. Neck supple. No JVD present.  Cardiovascular: Normal rate, regular rhythm, normal heart sounds and intact distal pulses.  Exam reveals no gallop and no friction rub.  No murmur heard. Pulmonary/Chest: Effort normal. No respiratory distress. He has wheezes. He has no rales. He exhibits no tenderness.  Bilateral wheezes  Abdominal: Soft. He exhibits no distension. There is no tenderness.  Musculoskeletal: Normal range of motion. He exhibits no edema and no tenderness.  Neurological: He is alert and oriented to person, place, and time. He has normal reflexes.  CN 3-12 intact  Skin: Skin is warm and dry.  Psychiatric: He has a normal mood and affect. His behavior is normal. Judgment and thought content normal.    ED Course  Procedures (including critical care time) Labs Review Labs Reviewed - No data to display Imaging Review Dg Chest 2 View (if Patient Has Fever And/or Copd)  03/25/2013   CLINICAL DATA:  Chest pain  EXAM: CHEST  2  VIEW  COMPARISON:  02/21/2013  FINDINGS: Cardiomediastinal silhouette is stable. No acute infiltrate or pleural effusion. Mild perihilar interstitial prominence without pulmonary edema.  IMPRESSION: No acute infiltrate or pleural effusion. Mild perihilar interstitial prominence without pulmonary edema.   Electronically Signed   By: Natasha Mead M.D.   On: 03/25/2013 10:37    EKG Interpretation   None       MDM   1. Asthma    Patient ambulated in ED with O2 saturations never increased above 80%, still has moderate increased work of breathing.  Solumedrol in the ED with 2 nebs and continuous neb. Will admit for asthma exacerbation.    Patient is taking xeralto for bilateral DVTs in LEs and PE.  He states that he has been 100% compliant.    Discussed the patient with Dr. Arbutus Leas, who will admit the patient.    Roxy Horseman, PA-C 03/25/13 [phone]

## 2013-03-25 NOTE — Progress Notes (Signed)
Utilization Review completed.  Teiana Hajduk RN CM  

## 2013-03-25 NOTE — Progress Notes (Signed)
   CARE MANAGEMENT ED NOTE 03/25/2013  Patient:  [name],[name]   Account Number:  [id number]  Date Initiated:  03/25/2013  Documentation initiated by:  Radford Pax  Subjective/Objective Assessment:   Patient presents to Ed with worsening breath for one week chest discomfort while coughing.     Subjective/Objective Assessment Detail:   Patient with pmhx of COPD.     Action/Plan:   Continuous nebs and solumedrol IV given to patient in ED   Action/Plan Detail:   Anticipated DC Date:       Status Recommendation to Physician:   Result of Recommendation:    Other ED Services  Consult Working Plan    DC Planning Services  CM consult  Other  Medication Assistance    Choice offered to / List presented to:            Status of service:  Completed, signed off  ED Comments:   ED Comments Detail:  [facility] consulted for medication needs.  Patient reports he does not have difficulty paying for his medications.  He reports, "The Medicare insurance is taking a hundred dollars out of my disbility check." Patient reports he is currently in the process of canceling his Medicare insurance.  He reports he applied for Medicaid and is waiting for his card.  Patient buys his medications from [facility].  EDCM explained to patient that he can have Medicare and Medicaid insurance but patient stated, "No! They are taking money out of my disability check!"  Patient reports that he does not have his inhaler.  "If You can give me an inhaler, that will hold me until I get my Medicaid card."  [facility] provided patient with a list of financial assistance in the coomunity, and websites needymeds.org and Good https://figueroa.info/ for medicaiton assistance. Patient may benefit from using nebulizer at home.  EDCM asked patient's RN to consult financial counselor per patient's request.  No further CM needs at this time.

## 2013-03-26 DIAGNOSIS — R0902 Hypoxemia: Secondary | ICD-10-CM

## 2013-03-26 LAB — TROPONIN I: Troponin I: <0.3 ng/mL (ref <0.30)

## 2013-03-26 LAB — BASIC METABOLIC PANEL
BUN: 10 mg/dL (ref 6–23)
CO2: 22 mEq/L (ref 19–32)
Calcium: 9.4 mg/dL (ref 8.4–10.5)
Creatinine, Ser: 0.67 mg/dL (ref 0.50–1.35)
GFR calc non Af Amer: >90 mL/min (ref >90)
Glucose, Bld: 280 mg/dL — ABNORMAL HIGH (ref 70–99)

## 2013-03-26 NOTE — Care Management Note (Addendum)
    Page 1 of 2   03/28/2013     10:20:36 AM   CARE MANAGEMENT NOTE 03/28/2013  Patient:  [name],[name]   Account Number:  [id number]  Date Initiated:  03/26/2013  Documentation initiated by:  Lanier Clam  Subjective/Objective Assessment:   52 Y/O M ADMITTED W/COPD EXAC.     Action/Plan:   FROM HOME.HAS PCP,PHARMACY-[facility].   Anticipated DC Date:  03/28/2013   Anticipated DC Plan:  HOME/SELF CARE      DC Planning Services  CM consult  MATCH Program      Choice offered to / List presented to:             Status of service:  Completed, signed off Medicare Important Message given?   (If response is "NO", the following Medicare IM given date fields will be blank) Date Medicare IM given:   Date Additional Medicare IM given:    Discharge Disposition:  HOME/SELF CARE  Per UR Regulation:  Reviewed for med. necessity/level of care/duration of stay  If discussed at Long Length of Stay Meetings, dates discussed:    Comments:  03/28/13 Norleen Xie RN,BSN NCM [phone] PATIENT HAD ALREADY D/C PRIOR TO AM ROUNDS. CM UNABLE TO SET UP ANY ASSISTANCE FOR MEDS.MD AWARE.  03/26/13 Lanier Clam RN BSN NCM [phone] AFTER THE RESEARCH INTO PATIENT 'S INSURANCE.HE DOES NOT HAVE MEDICARE PART D SCRIPT COVERAGE,& MEDICAID NOT EFFECTIVE.CURRENTLY WILL QUALIFY FOR MATCH PROGRAM IF D/C BY 03/27/13.WILL CHECK INSURANCE ON 03/28/13.PATIENT STATES WILL HAVE CO PAY $3 FOR EACH SCRIPT.POLICY:1XUSE/WITHIN 1 CALENDAR YEAR/NO NARCOTICS/SELECT PHARMACIES.PATIENT VOICED UNDERSTANDING. PATIENT HAS CONCERNS ABOUT HIS MEDICARE,MEDICAID, & SCRIPT COVERAGE.PATIENT STATES HIS CO PAY IS $3.I HAVE ASKED ADMITTING TO LET ME KNOW IF PATIENT HAS MEDICAID SINCE NOT SHOWING UP UNDER CURRENT INSURANCE.UNABLE TO GATHER ENOUGH INFO FOR SCRIPT COVERAGE UNDER MEDICARE BENEFIT PER CMA.TC PATIENT'S PHARMACY [facility][phone],SPOKE TO [place] PATIENT HAS NOT USED THEIR PHARMACY SINCE MAY 2014,BUT IS  UNDER HIS MEDICAID BENEFIT.THEY WOULD HAVE TO SWIPE HIS INSURANCE CARD TO CONFIRM IF ACTIVE.PATIENT DOES NOT HAVE ANY INSURANCE CARDS HERE.[facility] STATES THEY STILL CHECK AS LONG AS HE KNOWS HIS SS#, & SINCE HE IS A CURRENT PATIENT OF THEIRS.STATES HE IS WAITING FOR HIS CARDS IN THE MAIL.EXPLAINED THAT HE SHOULD RETURN TO [facility] @ D/C W/SCRIPTS TO BE FILLED SINCE THEY ARE FAMILAR WITH HIM.PATIENT VOICED UNDERSTANDING.

## 2013-03-26 NOTE — Progress Notes (Signed)
[facility] PROGRESS NOTE  [name] ZOX:[id number] DOB: [birth year] DOA: 03/25/2013 PCP: Jearld Lesch, MD  Assessment/Plan: Acute respiratory failure  -Secondary to COPD exacerbation  COPD exacerbation  -some improvement today -Continue intravenous steroids  -Levofloxacin 750 mg daily  - Aerosolized albuterol and Atrovent  Tobacco and alcohol dependence and use  -Tobacco cessation discussed  -Placed on alcohol withdrawal protocol  -Last drink was 2 prior to admission -States that he drinks 1 pint of liquor weekly and 2x40ounces beer/wk  Atypical chest pain  -Likely related to the patient's coughing and COPD exacerbation  -troponins-negative x3 -Check EKG  History of recurrent DVT and PE  -Patient endorses compliance with rivaroxaban  -Continue rivaroxaban  Thrombocytopenia  -Hemoglobin is stable  -Appears to be chronic  -Continue to monitor Leukopenia -appears chronic  Family Communication:   Solely patient Disposition Plan:   Home when medically stable  Antibiotics:  Levofloxacin 03/25/2013>>>       Procedures/Studies: Dg Chest 2 View (if Patient Has Fever And/or Copd)  03/25/2013   CLINICAL DATA:  Chest pain  EXAM: CHEST  2 VIEW  COMPARISON:  02/21/2013  FINDINGS: Cardiomediastinal silhouette is stable. No acute infiltrate or pleural effusion. Mild perihilar interstitial prominence without pulmonary edema.  IMPRESSION: No acute infiltrate or pleural effusion. Mild perihilar interstitial prominence without pulmonary edema.   Electronically Signed   By: Natasha Mead M.D.   On: 03/25/2013 10:37         Subjective: Patient denies fevers, chills, chest discomfort. His breathing is about 50% better. Denies any nausea, vomiting, diarrhea, abdominal pain.   Objective: Filed Vitals:   03/26/13 0811 03/26/13 1139 03/26/13 1427 03/26/13 1554  BP:  152/90 135/82   Pulse:  98 87   Temp:   98.1 F (36.7 C)   TempSrc:   Oral   Resp:   20   Height:       Weight:      SpO2: 97%  97% 97%    Intake/Output Summary (Last 24 hours) at 03/26/13 1654 Last data filed at 03/26/13 1427  Gross per 24 hour  Intake    480 ml  Output      0 ml  Net    480 ml   Weight change:  Exam:   General:  Pt is alert, follows commands appropriately, not in acute distress  HEENT: No icterus, No thrush,  [place]/AT  Cardiovascular: RRR, S1/S2, no rubs, no gallops  Respiratory: Bilateral expiratory wheeze. Scattered bibasilar rales. Good air movement.   Abdomen: Soft/+BS, non tender, non distended, no guarding  Extremities: 1+ edema R>L leg, No lymphangitis, No petechiae, No rashes, no synovitis  Data Reviewed: Basic Metabolic Panel:  Recent Labs Lab 03/25/13 1155 03/25/13 1750 03/26/13 0520  NA 142  --  138  K 4.4  --  3.6*  CL 102  --  100  CO2 19  --  22  GLUCOSE 102*  --  280*  BUN 9  --  10  CREATININE 0.70  --  0.67  CALCIUM 9.8  --  9.4  MG  --  1.9  --    Liver Function Tests: No results found for this basename: AST, ALT, ALKPHOS, BILITOT, PROT, ALBUMIN,  in the last 168 hours No results found for this basename: LIPASE, AMYLASE,  in the last 168 hours No results found for this basename: AMMONIA,  in the last 168 hours CBC:  Recent Labs Lab 03/25/13 1155  WBC 3.0*  NEUTROABS 0.8*  HGB  14.6  HCT 42.9  MCV 92.3  PLT 114*   Cardiac Enzymes:  Recent Labs Lab 03/25/13 1750 03/26/13 03/26/13 0520  TROPONINI <0.30 <0.30 <0.30   BNP: No components found with this basename: POCBNP,  CBG: No results found for this basename: GLUCAP,  in the last 168 hours  No results found for this or any previous visit (from the past 240 hour(s)).   Scheduled Meds: . folic acid  1 mg Oral Daily  . ipratropium-albuterol  3 mL Nebulization Q4H  . levofloxacin  750 mg Oral Daily  . methylPREDNISolone (SOLU-MEDROL) injection  60 mg Intravenous Q6H  . multivitamin with minerals  1 tablet Oral Daily  . rivaroxaban  20 mg Oral Q supper  .  sodium chloride  3 mL Intravenous Q12H  . thiamine  100 mg Oral Daily   Or  . thiamine  100 mg Intravenous Daily   Continuous Infusions:    Jasmene Goswami, DO  [facility] Pager [phone]  If 7PM-7AM, please contact night-coverage [email or web address] Password [email or web address] 03/26/2013, 4:54 PM   LOS: 1 day

## 2013-03-26 NOTE — Progress Notes (Signed)
Inpatient Diabetes Program Recommendations  AACE/ADA: New Consensus Statement on Inpatient Glycemic Control (2013)  Target Ranges:  Prepandial:   less than 140 mg/dL      Peak postprandial:   less than 180 mg/dL (1-2 hours)      Critically ill patients:  140 - 180 mg/dL     Results for GREYDEN, BESECKER (MRN [id number]) as of 03/26/2013 09:28  Ref. Range 03/26/2013 05:20  Glucose Latest Range: 70-99 mg/dL 409 (H)    **Lab glucose elevated this morning.  Noted patient receiving IV steroids (Solumedrol 60 mg Q6 hours).   **MD- Please consider order for CBG checks tid ac + HS while patient on IV steroids.  If elevated, please consider Novolog Moderate SSI.    Will follow. Ambrose Finland RN, MSN, CDE Diabetes Coordinator Inpatient Diabetes Program Team Pager: [phone] (8a-10p)

## 2013-03-27 DIAGNOSIS — D696 Thrombocytopenia, unspecified: Secondary | ICD-10-CM

## 2013-03-27 DIAGNOSIS — Z91199 Patient's noncompliance with other medical treatment and regimen due to unspecified reason: Secondary | ICD-10-CM | POA: Diagnosis not present

## 2013-03-27 DIAGNOSIS — J9600 Acute respiratory failure, unspecified whether with hypoxia or hypercapnia: Secondary | ICD-10-CM | POA: Diagnosis not present

## 2013-03-27 DIAGNOSIS — Z9119 Patient's noncompliance with other medical treatment and regimen: Secondary | ICD-10-CM | POA: Diagnosis not present

## 2013-03-27 DIAGNOSIS — J441 Chronic obstructive pulmonary disease with (acute) exacerbation: Secondary | ICD-10-CM | POA: Diagnosis not present

## 2013-03-27 LAB — BASIC METABOLIC PANEL
BUN: 15 mg/dL (ref 6–23)
CO2: 18 mEq/L — ABNORMAL LOW (ref 19–32)
Calcium: 9.7 mg/dL (ref 8.4–10.5)
Chloride: 106 mEq/L (ref 96–112)
Creatinine, Ser: 0.7 mg/dL (ref 0.50–1.35)
GFR calc Af Amer: >90 mL/min (ref >90)
GFR calc non Af Amer: >90 mL/min (ref >90)
Glucose, Bld: 189 mg/dL — ABNORMAL HIGH (ref 70–99)
Potassium: 5.5 mEq/L — ABNORMAL HIGH (ref 3.7–5.3)
Sodium: 143 mEq/L (ref 137–147)

## 2013-03-27 LAB — CBC
HCT: 36.7 % — ABNORMAL LOW (ref 39.0–52.0)
Hemoglobin: 12.5 g/dL — ABNORMAL LOW (ref 13.0–17.0)
MCH: 31.6 pg (ref 26.0–34.0)
MCHC: 34.1 g/dL (ref 30.0–36.0)
MCV: 92.7 fL (ref 78.0–100.0)
Platelets: 107 10*3/uL — ABNORMAL LOW (ref 150–400)
RBC: 3.96 MIL/uL — ABNORMAL LOW (ref 4.22–5.81)
RDW: 13 % (ref 11.5–15.5)
WBC: 5.3 10*3/uL (ref 4.0–10.5)

## 2013-03-27 MED ORDER — IPRATROPIUM-ALBUTEROL 0.5-2.5 (3) MG/3ML IN SOLN
3.0000 mL | Freq: Three times a day (TID) | RESPIRATORY_TRACT | Status: DC
  Administered 2013-03-27 – 2013-03-28 (×2): 3 mL via RESPIRATORY_TRACT
  Filled 2013-03-27 (×2): qty 3

## 2013-03-27 MED ORDER — SODIUM CHLORIDE 0.9 % IV SOLN
INTRAVENOUS | Status: DC
  Administered 2013-03-27: 19:00:00 1000 mL via INTRAVENOUS
  Administered 2013-03-28: 05:00:00 via INTRAVENOUS

## 2013-03-27 MED ORDER — PREDNISONE 50 MG PO TABS
60.0000 mg | ORAL_TABLET | Freq: Every day | ORAL | Status: DC
  Administered 2013-03-28: 60 mg via ORAL
  Filled 2013-03-27 (×2): qty 1

## 2013-03-27 NOTE — Progress Notes (Signed)
[facility] PROGRESS NOTE  [name] FAO:[id number] DOB: 1960/09/16 DOA: 03/25/2013 PCP: [name], MD  Assessment/Plan: Acute respiratory failure  -Secondary to COPD exacerbation  COPD exacerbation  -some improvement today  -Continue intravenous steroids  -switch to po steroids am 03/28/13 -Levofloxacin 750 mg daily  - Aerosolized albuterol and Atrovent  Tobacco and alcohol dependence and use  -Tobacco cessation discussed  -Placed on alcohol withdrawal protocol  -Last drink was 2 prior to admission  -States that he drinks 1 pint of liquor weekly and 2x40ounces beer/wk  Atypical chest pain  -Likely related to the patient's coughing and COPD exacerbation  -troponins-negative x3  -Check EKG  History of recurrent DVT and PE  -Patient endorses compliance with rivaroxaban  -Continue rivaroxaban  Thrombocytopenia  -Hemoglobin is stable  -Appears to be chronic  -Continue to monitor  Leukopenia  -appears chronic   Family Communication: Solely patient  Disposition Plan: Home 03/28/13 Antibiotics:  Levofloxacin 03/25/2013>>>      Procedures/Studies: Dg Chest 2 View (if Patient Has Fever And/or Copd)  03/25/2013   CLINICAL DATA:  Chest pain  EXAM: CHEST  2 VIEW  COMPARISON:  02/21/2013  FINDINGS: Cardiomediastinal silhouette is stable. No acute infiltrate or pleural effusion. Mild perihilar interstitial prominence without pulmonary edema.  IMPRESSION: No acute infiltrate or pleural effusion. Mild perihilar interstitial prominence without pulmonary edema.   Electronically Signed   By: Lahoma Crocker M.D.   On: 03/25/2013 10:37         Subjective:   Objective: Filed Vitals:   03/27/13 0542 03/27/13 0803 03/27/13 1258 03/27/13 1449  BP: 140/86   143/82  Pulse: 83   82  Temp: 98.2 F (36.8 C)   98.1 F (36.7 C)  TempSrc: Oral   Oral  Resp: 18   20  Height:      Weight:      SpO2:  99% 100% 98%    Intake/Output Summary (Last 24 hours) at 03/27/13  1847 Last data filed at 03/27/13 1449  Gross per 24 hour  Intake    723 ml  Output      0 ml  Net    723 ml   Weight change:  Exam:   General:  Pt is alert, follows commands appropriately, not in acute distress  HEENT: No icterus, No thrush,, [place]/AT  Cardiovascular: RRR, S1/S2, no rubs, no gallops  Respiratory: Bibasilar rales. Minimal basilar wheezing. Good air movement.  Abdomen: Soft/+BS, non tender, non distended, no guarding  Extremities: trace LE edema, No lymphangitis, No petechiae, No rashes, no synovitis  Data Reviewed: Basic Metabolic Panel:  Recent Labs Lab 03/25/13 1155 03/25/13 1750 03/26/13 0520 03/27/13 0607  NA 142  --  138 143  K 4.4  --  3.6* 5.5*  CL 102  --  100 106  CO2 19  --  22 18*  GLUCOSE 102*  --  280* 189*  BUN 9  --  10 15  CREATININE 0.70  --  0.67 0.70  CALCIUM 9.8  --  9.4 9.7  MG  --  1.9  --   --    Liver Function Tests: No results found for this basename: AST, ALT, ALKPHOS, BILITOT, PROT, ALBUMIN,  in the last 168 hours No results found for this basename: LIPASE, AMYLASE,  in the last 168 hours No results found for this basename: AMMONIA,  in the last 168 hours CBC:  Recent Labs Lab 03/25/13 1155 03/27/13 0607  WBC 3.0* 5.3  NEUTROABS 0.8*  --  HGB 14.6 12.5*  HCT 42.9 36.7*  MCV 92.3 92.7  PLT 114* 107*   Cardiac Enzymes:  Recent Labs Lab 03/25/13 1750 03/26/13 03/26/13 0520  TROPONINI <0.30 <0.30 <0.30   BNP: No components found with this basename: POCBNP,  CBG: No results found for this basename: GLUCAP,  in the last 168 hours  No results found for this or any previous visit (from the past 240 hour(s)).   Scheduled Meds: . folic acid  1 mg Oral Daily  . ipratropium-albuterol  3 mL Nebulization TID  . levofloxacin  750 mg Oral Daily  . methylPREDNISolone (SOLU-MEDROL) injection  60 mg Intravenous Q6H  . multivitamin with minerals  1 tablet Oral Daily  . rivaroxaban  20 mg Oral Q supper  . sodium  chloride  3 mL Intravenous Q12H  . thiamine  100 mg Oral Daily   Or  . thiamine  100 mg Intravenous Daily   Continuous Infusions:    Glendora Clouatre, DO  [facility] Pager [phone]  If 7PM-7AM, please contact night-coverage [email or web address] Password [facility] 03/27/2013, 6:47 PM   LOS: 2 days

## 2013-03-28 DIAGNOSIS — Z91199 Patient's noncompliance with other medical treatment and regimen due to unspecified reason: Secondary | ICD-10-CM | POA: Diagnosis not present

## 2013-03-28 DIAGNOSIS — Z9119 Patient's noncompliance with other medical treatment and regimen: Secondary | ICD-10-CM | POA: Diagnosis not present

## 2013-03-28 DIAGNOSIS — J9600 Acute respiratory failure, unspecified whether with hypoxia or hypercapnia: Secondary | ICD-10-CM | POA: Diagnosis not present

## 2013-03-28 DIAGNOSIS — I82409 Acute embolism and thrombosis of unspecified deep veins of unspecified lower extremity: Secondary | ICD-10-CM | POA: Diagnosis not present

## 2013-03-28 DIAGNOSIS — D72819 Decreased white blood cell count, unspecified: Secondary | ICD-10-CM | POA: Diagnosis not present

## 2013-03-28 LAB — BASIC METABOLIC PANEL
BUN: 18 mg/dL (ref 6–23)
CHLORIDE: 108 meq/L (ref 96–112)
CO2: 23 mEq/L (ref 19–32)
Calcium: 8.8 mg/dL (ref 8.4–10.5)
Creatinine, Ser: 0.67 mg/dL (ref 0.50–1.35)
GFR calc Af Amer: >90 mL/min (ref >90)
GFR calc non Af Amer: >90 mL/min (ref >90)
Glucose, Bld: 140 mg/dL — ABNORMAL HIGH (ref 70–99)
POTASSIUM: 4.5 meq/L (ref 3.7–5.3)
Sodium: 141 mEq/L (ref 137–147)

## 2013-03-28 MED ORDER — PREDNISONE 10 MG PO TABS: 50.0000 mg | ORAL_TABLET | Freq: Every day | ORAL | Status: DC

## 2013-03-28 MED ORDER — ALBUTEROL SULFATE HFA 108 (90 BASE) MCG/ACT IN AERS: 2.0000 | INHALATION_SPRAY | Freq: Four times a day (QID) | RESPIRATORY_TRACT | Status: DC | PRN

## 2013-03-28 MED ORDER — LEVOFLOXACIN 750 MG PO TABS: 750.0000 mg | ORAL_TABLET | Freq: Every day | ORAL | Status: DC

## 2013-03-28 MED ORDER — RIVAROXABAN 20 MG PO TABS: 20.0000 mg | ORAL_TABLET | Freq: Every day | ORAL | Status: DC

## 2013-03-28 MED ORDER — MOMETASONE FURO-FORMOTEROL FUM 200-5 MCG/ACT IN AERO: 2.0000 | INHALATION_SPRAY | Freq: Two times a day (BID) | RESPIRATORY_TRACT | Status: DC

## 2013-03-28 NOTE — Discharge Summary (Signed)
Physician Discharge Summary  [name] WRU:[id number] DOB: [birth year] DOA: 03/25/2013  PCP: [name], MD  Admit date: 03/25/2013 Discharge date: 03/28/2013  Recommendations for Outpatient Follow-up:  1. Pt will need to follow up with PCP in 2 weeks post discharge 2. Please obtain BMP to evaluate electrolytes and kidney function 3. Please also check CBC to evaluate Hg and Hct levels   Discharge Diagnoses:  Active Problems:   History of noncompliance with medical treatment   Leukopenia   Tobacco abuse   DVT (deep venous thrombosis)   COPD exacerbation   Acute respiratory failure Acute respiratory failure  -Secondary to COPD exacerbation  COPD exacerbation  -some improvement today  -started on intravenous steroids  -switch to po steroids am 03/28/13--prednisone 60 mg, decrease by 10 mg daily  -Levofloxacin 750 mg daily x 3 additional days, completing 7 days of therapy - Aerosolized albuterol and Atrovent  -albuterol MDI Rx, #1, given at d/c; Dulera MDI Rx, #1 given at D/C Tobacco and alcohol dependence and use  -Tobacco cessation discussed  -Placed on alcohol withdrawal protocol  -Last drink was 2 prior to admission  -States that he drinks 1 pint of liquor weekly and 2x40ounces beer/wk  -CIWA score trended down, 2--on day of d/c Atypical chest pain  -Likely related to the patient's coughing and COPD exacerbation  -troponins-negative x3  History of recurrent DVT and PE  -Patient endorses compliance with rivaroxaban  -Continue rivaroxaban 20 mg daily Thrombocytopenia  -Hemoglobin is stable  -Appears to be chronic  -Continue to monitor  Leukopenia  -appears chronic   Discharge Condition: Stable  Disposition:  home  Diet: Regular Wt Readings from Last 3 Encounters:  03/25/13 77.111 kg (170 lb)  02/22/13 72.4 kg (159 lb 9.8 oz)  08/08/12 81.647 kg (180 lb)    History of present illness:  53 year old male with a history of recurrent DVT, PE with IVC  filter on xarelto, COPD with continued tobacco use and alcohol use presents with one-week history of worsening shortness of breath. The patient tried to use multiple doses of his albuterol MDI which did not help his shortness of breath. X-rays are taken to the ED for further evaluation. He complains of increasing cough with yellow sputum. He denies any fevers, chills, hemoptysis, nausea, vomiting, diarrhea, abdominal pain, dysuria, hematuria, hematochezia. He complains of chest discomfort with coughing. The patient was discharged from the hospital on 02/24/2013 after an admission for COPD exacerbation. The patient never picked up his prescription for prednisone or levofloxacin. The patient endorses compliance with rivaroxaban. In the ED, the patient received intravenous IV solu-Medrol and continuous nebulizers. However the patient remained hypoxemic into the 70s with ambulation.     Discharge Exam: Filed Vitals:   03/28/13 0530  BP: 147/87  Pulse: 75  Temp: 98.2 F (36.8 C)  Resp: 18   Filed Vitals:   03/27/13 1449 03/27/13 1941 03/27/13 2036 03/28/13 0530  BP: 143/82  135/79 147/87  Pulse: 82  99 75  Temp: 98.1 F (36.7 C)  98.1 F (36.7 C) 98.2 F (36.8 C)  TempSrc: Oral  Oral Oral  Resp: 20  19 18   Height:      Weight:      SpO2: 98% 94% 97% 97%   General: A&O x 3, NAD, pleasant, cooperative Cardiovascular: RRR, no rub, no gallop, no S3 Respiratory: Scattered bibasilar rales. No wheezing. Good air movement. Abdomen:soft, nontender, nondistended, positive bowel sounds Extremities: No edema, No lymphangitis, no petechiae  Discharge Instructions  Medication List         albuterol 108 (90 BASE) MCG/ACT inhaler  Commonly known as:  PROVENTIL HFA;VENTOLIN HFA  Inhale 2 puffs into the lungs every 6 (six) hours as needed for wheezing or shortness of breath.     levofloxacin 750 MG tablet  Commonly known as:  LEVAQUIN  Take 1 tablet (750 mg total) by mouth daily.      mometasone-formoterol 200-5 MCG/ACT Aero  Commonly known as:  DULERA  Inhale 2 puffs into the lungs 2 (two) times daily.     predniSONE 10 MG tablet  Commonly known as:  DELTASONE  Take 5 tablets (50 mg total) by mouth daily with breakfast. Starting 03/29/13.  Decrease by one tablet daily until gone     Rivaroxaban 20 MG Tabs tablet  Commonly known as:  XARELTO  Take 1 tablet (20 mg total) by mouth daily with supper.         The results of significant diagnostics from this hospitalization (including imaging, microbiology, ancillary and laboratory) are listed below for reference.    Significant Diagnostic Studies: Dg Chest 2 View (if Patient Has Fever And/or Copd)  03/25/2013   CLINICAL DATA:  Chest pain  EXAM: CHEST  2 VIEW  COMPARISON:  02/21/2013  FINDINGS: Cardiomediastinal silhouette is stable. No acute infiltrate or pleural effusion. Mild perihilar interstitial prominence without pulmonary edema.  IMPRESSION: No acute infiltrate or pleural effusion. Mild perihilar interstitial prominence without pulmonary edema.   Electronically Signed   By: Lahoma Crocker M.D.   On: 03/25/2013 10:37     Microbiology: No results found for this or any previous visit (from the past 240 hour(s)).   Labs: Basic Metabolic Panel:  Recent Labs Lab 03/25/13 1155 03/25/13 1750 03/26/13 0520 03/27/13 0607 03/28/13 0520  NA 142  --  138 143 141  K 4.4  --  3.6* 5.5* 4.5  CL 102  --  100 106 108  CO2 19  --  22 18* 23  GLUCOSE 102*  --  280* 189* 140*  BUN 9  --  10 15 18   CREATININE 0.70  --  0.67 0.70 0.67  CALCIUM 9.8  --  9.4 9.7 8.8  MG  --  1.9  --   --   --    Liver Function Tests: No results found for this basename: AST, ALT, ALKPHOS, BILITOT, PROT, ALBUMIN,  in the last 168 hours No results found for this basename: LIPASE, AMYLASE,  in the last 168 hours No results found for this basename: AMMONIA,  in the last 168 hours CBC:  Recent Labs Lab 03/25/13 1155 03/27/13 0607  WBC 3.0*  5.3  NEUTROABS 0.8*  --   HGB 14.6 12.5*  HCT 42.9 36.7*  MCV 92.3 92.7  PLT 114* 107*   Cardiac Enzymes:  Recent Labs Lab 03/25/13 1750 03/26/13 03/26/13 0520  TROPONINI <0.30 <0.30 <0.30   BNP: No components found with this basename: POCBNP,  CBG: No results found for this basename: GLUCAP,  in the last 168 hours  Time coordinating discharge:  Greater than 30 minutes  Signed:  Jonnell Hentges, DO [facility] Pager: [phone] 03/28/2013, 7:44 AM

## 2013-06-03 ENCOUNTER — Emergency Department (HOSPITAL_COMMUNITY): Payer: Medicare Other

## 2013-06-03 ENCOUNTER — Emergency Department (HOSPITAL_COMMUNITY)
Admission: EM | Admit: 2013-06-03 | Discharge: 2013-06-03 | Disposition: A | Discharge status: Home / Self Care | Payer: Medicare Other | Attending: Emergency Medicine | Admitting: Emergency Medicine

## 2013-06-03 ENCOUNTER — Encounter (HOSPITAL_COMMUNITY): Payer: Self-pay | Admitting: Emergency Medicine

## 2013-06-03 DIAGNOSIS — Z86711 Personal history of pulmonary embolism: Secondary | ICD-10-CM | POA: Diagnosis not present

## 2013-06-03 DIAGNOSIS — Z86718 Personal history of other venous thrombosis and embolism: Secondary | ICD-10-CM | POA: Insufficient documentation

## 2013-06-03 DIAGNOSIS — Z79899 Other long term (current) drug therapy: Secondary | ICD-10-CM | POA: Insufficient documentation

## 2013-06-03 DIAGNOSIS — R0602 Shortness of breath: Secondary | ICD-10-CM | POA: Diagnosis not present

## 2013-06-03 DIAGNOSIS — J441 Chronic obstructive pulmonary disease with (acute) exacerbation: Secondary | ICD-10-CM | POA: Insufficient documentation

## 2013-06-03 DIAGNOSIS — IMO0002 Reserved for concepts with insufficient information to code with codable children: Secondary | ICD-10-CM | POA: Diagnosis not present

## 2013-06-03 DIAGNOSIS — F17200 Nicotine dependence, unspecified, uncomplicated: Secondary | ICD-10-CM | POA: Diagnosis not present

## 2013-06-03 DIAGNOSIS — J45901 Unspecified asthma with (acute) exacerbation: Principal | ICD-10-CM

## 2013-06-03 LAB — BASIC METABOLIC PANEL
BUN: 11 mg/dL (ref 6–23)
CALCIUM: 9.3 mg/dL (ref 8.4–10.5)
CHLORIDE: 103 meq/L (ref 96–112)
CO2: 26 meq/L (ref 19–32)
Creatinine, Ser: 0.79 mg/dL (ref 0.50–1.35)
GFR calc Af Amer: >90 mL/min (ref >90)
GFR calc non Af Amer: >90 mL/min (ref >90)
GLUCOSE: 145 mg/dL — AB (ref 70–99)
POTASSIUM: 3.1 meq/L — AB (ref 3.7–5.3)
SODIUM: 144 meq/L (ref 137–147)

## 2013-06-03 LAB — CBC
HEMATOCRIT: 39.3 % (ref 39.0–52.0)
Hemoglobin: 13.5 g/dL (ref 13.0–17.0)
MCH: 32.3 pg (ref 26.0–34.0)
MCHC: 34.4 g/dL (ref 30.0–36.0)
MCV: 94 fL (ref 78.0–100.0)
Platelets: 145 10*3/uL — ABNORMAL LOW (ref 150–400)
RBC: 4.18 MIL/uL — AB (ref 4.22–5.81)
RDW: 13.9 % (ref 11.5–15.5)
WBC: 4.7 10*3/uL (ref 4.0–10.5)

## 2013-06-03 LAB — I-STAT TROPONIN, ED: Troponin i, poc: 0.01 ng/mL (ref 0.00–0.08)

## 2013-06-03 MED ORDER — ALBUTEROL SULFATE HFA 108 (90 BASE) MCG/ACT IN AERS
2.0000 | INHALATION_SPRAY | Freq: Once | RESPIRATORY_TRACT | Status: AC
  Administered 2013-06-03: 2 via RESPIRATORY_TRACT
  Filled 2013-06-03: qty 6.7

## 2013-06-03 MED ORDER — POTASSIUM CHLORIDE CRYS ER 20 MEQ PO TBCR
40.0000 meq | EXTENDED_RELEASE_TABLET | Freq: Once | ORAL | Status: AC
  Administered 2013-06-03: 40 meq via ORAL
  Filled 2013-06-03: qty 2

## 2013-06-03 MED ORDER — LEVOFLOXACIN 500 MG PO TABS: 500.0000 mg | ORAL_TABLET | Freq: Every day | ORAL | Status: DC

## 2013-06-03 MED ORDER — PREDNISONE 20 MG PO TABS: 40.0000 mg | ORAL_TABLET | Freq: Every day | ORAL | Status: DC

## 2013-06-03 NOTE — ED Provider Notes (Signed)
CSN: [id number]     Arrival date & time 06/03/13  1304 History   First MD Initiated Contact with Patient 06/03/13 1317     Chief Complaint  Patient presents with  . Shortness of Breath     (Consider location/radiation/quality/duration/timing/severity/associated sxs/prior Treatment) HPI Comments: Patient is a 53 year old male with a past medical history of asthma, COPD, asthma, PE, medication non compliance and tobacco abuse who presents with SOB that started today. Symptoms started gradually and progressively worsened since the onset. Patient did not try anything for symptoms relief. Patient reports that this feels like his asthma exacerbation. 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 No aggravating/alleviating factors.   Patient is a 53 y.o. male presenting with shortness of breath.  Shortness of Breath Associated symptoms: cough   Associated symptoms: no abdominal pain, no chest pain, no fever, no neck pain and no vomiting     Past Medical History  Diagnosis Date  . Asthma   . DVT (deep venous thrombosis)   . Pulmonary embolism    Past Surgical History  Procedure Laterality Date  . Ivc filter     Family History  Problem Relation Age of Onset  . Asthma Mother   . Allergies Mother   . Allergies Sister   . Allergies Brother    History  Substance Use Topics  . Smoking status: Current Some Day Smoker -- 0.20 packs/day for 30 years    Types: Cigarettes  . Smokeless tobacco: Never Used  . Alcohol Use: 0.0 oz/week     Comment: drinks 2 40oz beers weekly, drinks about a pint of liquor    Review of Systems  Constitutional: Negative for fever, chills and fatigue.  HENT: Negative for trouble swallowing.   Eyes: Negative for visual disturbance.  Respiratory: Positive for cough and shortness of breath.   Cardiovascular: Negative for chest pain and palpitations.  Gastrointestinal: Negative for nausea, vomiting, abdominal pain  and diarrhea.  Genitourinary: Negative for dysuria and difficulty urinating.  Musculoskeletal: Negative for arthralgias and neck pain.  Skin: Negative for color change.  Neurological: Negative for dizziness and weakness.  Psychiatric/Behavioral: Negative for dysphoric mood.      Allergies  Review of patient's allergies indicates no known allergies.  Home Medications   Current Outpatient Rx  Name  Route  Sig  Dispense  Refill  . albuterol (PROVENTIL HFA;VENTOLIN HFA) 108 (90 BASE) MCG/ACT inhaler   Inhalation   Inhale 2 puffs into the lungs every 6 (six) hours as needed for wheezing or shortness of breath.   1 Inhaler   0   . mometasone-formoterol (DULERA) 200-5 MCG/ACT AERO   Inhalation   Inhale 2 puffs into the lungs 2 (two) times daily.   1 Inhaler   0    BP 119/77  Pulse 89  Temp(Src) 97.5 F (36.4 C) (Oral)  Resp 18  Ht 6\' 2"  (1.88 m)  Wt 160 lb (72.576 kg)  BMI 20.53 kg/m2  SpO2 98% Physical Exam  Nursing note and vitals reviewed. Constitutional: He is oriented to person, place, and time. He appears well-developed and well-nourished. No distress.  HENT:  Head: Normocephalic and atraumatic.  Eyes: Conjunctivae and EOM are normal.  Neck: Normal range of motion.  Cardiovascular: Normal rate and regular rhythm.  Exam reveals no gallop and no friction rub.   No murmur heard. Pulmonary/Chest: Effort normal. He has no wheezes. He has no rales. He exhibits no tenderness.  Rhonchi noted in all lung fields bilaterally.   Abdominal: Soft. He exhibits no distension. There is no tenderness. There is no rebound and no guarding.  Musculoskeletal: Normal range of motion.  Neurological: He is alert and oriented to person, place, and time. Coordination normal.  Speech is goal-oriented. Moves limbs without ataxia.   Skin: Skin is warm and dry.  Psychiatric: He has a normal mood and affect. His behavior is normal.    ED Course  Procedures (including critical care  time) Labs Review Labs Reviewed  CBC - Abnormal; Notable for the following:    RBC 4.18 (*)    Platelets 145 (*)    All other components within normal limits  BASIC METABOLIC PANEL - Abnormal; Notable for the following:    Potassium 3.1 (*)    Glucose, Bld 145 (*)    All other components within normal limits  I-STAT TROPOININ, ED   Imaging Review Dg Chest 2 View (if Patient Has Fever And/or Copd)  06/03/2013   CLINICAL DATA Shortness of breath.  EXAM CHEST  2 VIEW  COMPARISON 03/25/2013  FINDINGS Pulmonary hyperinflation. No infiltrate or edema. No effusion or pneumothorax. Normal heart size. IVC filter partly imaged.  IMPRESSION No active cardiopulmonary disease.  SIGNATURE  Electronically Signed   By: Jorje Guild M.D.   On: 06/03/2013 13:54     EKG Interpretation   Date/Time:  Tuesday June 03 2013 13:07:36 EDT Ventricular Rate:  97 PR Interval:  171 QRS Duration: 86 QT Interval:  371 QTC Calculation: 471 R Axis:   17 Text Interpretation:  Sinus rhythm Probable left atrial enlargement  Abnormal R-wave progression, early transition ST elevation, consider  inferior injury since last tracing no significant change Confirmed by  Eulis Foster  MD, ELLIOTT ([id number]) on 06/03/2013 1:26:46 PM      MDM   Final diagnoses:  COPD exacerbation    3:13 PM Labs, chest xray and EKG unremarkable for acute changes. Patient feeling better after nebulizer treatment. I will treat patient for COPD exacerbation. Vitals stable and patient afebrile. Patient agreeable to plan and instructed to return with worsening or concerning symptoms.     Alvina Chou, PA-C 06/03/13 1526

## 2013-06-03 NOTE — ED Provider Notes (Signed)
Medical screening examination/treatment/procedure(s) were performed by non-physician practitioner and as supervising physician I was immediately available for consultation/collaboration.  Richarda Blade, MD 06/03/13 [phone]

## 2013-06-03 NOTE — Discharge Instructions (Signed)
Take Levaquin as directed until gone. Take Prednisone as directed until gone. Refer to attached documents for more information. Return to the ED with worsening or concerning symptoms.

## 2013-06-03 NOTE — ED Notes (Signed)
Per [facility], pt c/o SOB for 2-3 weeks ago but worse today. Hx of asthma, COPD, and PE. Noncompliant with medications. Given duoneb treatment by EMS, with 10 mg albuterol and 0.5 atrovent. And 125 mg solumedrol. Has a productive cough with white and gray sputum. Pain in right side for 2 months with numb feeling. Rhonchi bilaterally. 22g to right hand.

## 2013-06-18 ENCOUNTER — Emergency Department (HOSPITAL_COMMUNITY): Payer: Medicare Other

## 2013-06-18 ENCOUNTER — Encounter (HOSPITAL_COMMUNITY): Payer: Self-pay | Admitting: Emergency Medicine

## 2013-06-18 ENCOUNTER — Inpatient Hospital Stay (HOSPITAL_COMMUNITY)
Admission: EM | Admit: 2013-06-18 | Discharge: 2013-06-20 | DRG: 189 | Disposition: A | Discharge status: Home / Self Care | Payer: Medicare Other | Attending: Internal Medicine | Admitting: Internal Medicine

## 2013-06-18 DIAGNOSIS — D649 Anemia, unspecified: Secondary | ICD-10-CM | POA: Diagnosis present

## 2013-06-18 DIAGNOSIS — F17200 Nicotine dependence, unspecified, uncomplicated: Secondary | ICD-10-CM | POA: Diagnosis present

## 2013-06-18 DIAGNOSIS — E440 Moderate protein-calorie malnutrition: Secondary | ICD-10-CM | POA: Diagnosis present

## 2013-06-18 DIAGNOSIS — D696 Thrombocytopenia, unspecified: Secondary | ICD-10-CM

## 2013-06-18 DIAGNOSIS — J9600 Acute respiratory failure, unspecified whether with hypoxia or hypercapnia: Principal | ICD-10-CM | POA: Diagnosis present

## 2013-06-18 DIAGNOSIS — R079 Chest pain, unspecified: Secondary | ICD-10-CM | POA: Diagnosis not present

## 2013-06-18 DIAGNOSIS — J45901 Unspecified asthma with (acute) exacerbation: Secondary | ICD-10-CM | POA: Diagnosis not present

## 2013-06-18 DIAGNOSIS — Z825 Family history of asthma and other chronic lower respiratory diseases: Secondary | ICD-10-CM

## 2013-06-18 DIAGNOSIS — F1010 Alcohol abuse, uncomplicated: Secondary | ICD-10-CM | POA: Diagnosis present

## 2013-06-18 DIAGNOSIS — J45909 Unspecified asthma, uncomplicated: Secondary | ICD-10-CM | POA: Diagnosis present

## 2013-06-18 DIAGNOSIS — E46 Unspecified protein-calorie malnutrition: Secondary | ICD-10-CM | POA: Diagnosis present

## 2013-06-18 DIAGNOSIS — R5383 Other fatigue: Secondary | ICD-10-CM

## 2013-06-18 DIAGNOSIS — I2782 Chronic pulmonary embolism: Secondary | ICD-10-CM | POA: Diagnosis present

## 2013-06-18 DIAGNOSIS — R5381 Other malaise: Secondary | ICD-10-CM | POA: Diagnosis present

## 2013-06-18 DIAGNOSIS — R42 Dizziness and giddiness: Secondary | ICD-10-CM | POA: Diagnosis present

## 2013-06-18 DIAGNOSIS — IMO0002 Reserved for concepts with insufficient information to code with codable children: Secondary | ICD-10-CM

## 2013-06-18 DIAGNOSIS — Z789 Other specified health status: Secondary | ICD-10-CM | POA: Diagnosis present

## 2013-06-18 DIAGNOSIS — Z7901 Long term (current) use of anticoagulants: Secondary | ICD-10-CM

## 2013-06-18 DIAGNOSIS — Z86711 Personal history of pulmonary embolism: Secondary | ICD-10-CM | POA: Diagnosis present

## 2013-06-18 DIAGNOSIS — D61818 Other pancytopenia: Secondary | ICD-10-CM | POA: Diagnosis present

## 2013-06-18 DIAGNOSIS — R0602 Shortness of breath: Secondary | ICD-10-CM | POA: Diagnosis not present

## 2013-06-18 DIAGNOSIS — I82509 Chronic embolism and thrombosis of unspecified deep veins of unspecified lower extremity: Secondary | ICD-10-CM | POA: Diagnosis present

## 2013-06-18 DIAGNOSIS — J441 Chronic obstructive pulmonary disease with (acute) exacerbation: Secondary | ICD-10-CM | POA: Diagnosis present

## 2013-06-18 DIAGNOSIS — Z720 Tobacco use: Secondary | ICD-10-CM | POA: Diagnosis present

## 2013-06-18 LAB — CBC WITH DIFFERENTIAL/PLATELET
Basophils Absolute: 0 10*3/uL (ref 0.0–0.1)
Basophils Relative: 0 % (ref 0–1)
EOS PCT: 0 % (ref 0–5)
Eosinophils Absolute: 0 10*3/uL (ref 0.0–0.7)
HEMATOCRIT: 41.9 % (ref 39.0–52.0)
HEMOGLOBIN: 14.1 g/dL (ref 13.0–17.0)
LYMPHS PCT: 18 % (ref 12–46)
Lymphs Abs: 1.3 10*3/uL (ref 0.7–4.0)
MCH: 31.4 pg (ref 26.0–34.0)
MCHC: 33.7 g/dL (ref 30.0–36.0)
MCV: 93.3 fL (ref 78.0–100.0)
MONOS PCT: 9 % (ref 3–12)
Monocytes Absolute: 0.6 10*3/uL (ref 0.1–1.0)
Neutro Abs: 5 10*3/uL (ref 1.7–7.7)
Neutrophils Relative %: 73 % (ref 43–77)
PLATELETS: 113 10*3/uL — AB (ref 150–400)
RBC: 4.49 MIL/uL (ref 4.22–5.81)
RDW: 13.9 % (ref 11.5–15.5)
WBC: 6.9 10*3/uL (ref 4.0–10.5)

## 2013-06-18 LAB — I-STAT CHEM 8, ED
BUN: 27 mg/dL — AB (ref 6–23)
Calcium, Ion: 1.14 mmol/L (ref 1.12–1.23)
Chloride: 103 mEq/L (ref 96–112)
Creatinine, Ser: 0.9 mg/dL (ref 0.50–1.35)
GLUCOSE: 103 mg/dL — AB (ref 70–99)
HCT: 47 % (ref 39.0–52.0)
Hemoglobin: 16 g/dL (ref 13.0–17.0)
Potassium: 4 mEq/L (ref 3.7–5.3)
SODIUM: 138 meq/L (ref 137–147)
TCO2: 24 mmol/L (ref 0–100)

## 2013-06-18 MED ORDER — ALBUTEROL SULFATE (2.5 MG/3ML) 0.083% IN NEBU
5.0000 mg | INHALATION_SOLUTION | Freq: Once | RESPIRATORY_TRACT | Status: AC
  Administered 2013-06-18: 5 mg via RESPIRATORY_TRACT
  Filled 2013-06-18: qty 6

## 2013-06-18 MED ORDER — METHYLPREDNISOLONE SODIUM SUCC 125 MG IJ SOLR
60.0000 mg | Freq: Three times a day (TID) | INTRAMUSCULAR | Status: DC
  Administered 2013-06-18 – 2013-06-20 (×5): 60 mg via INTRAVENOUS
  Filled 2013-06-18 (×8): qty 0.96

## 2013-06-18 MED ORDER — SODIUM CHLORIDE 0.9 % IV SOLN
INTRAVENOUS | Status: DC
  Administered 2013-06-18: 1000 mL via INTRAVENOUS
  Administered 2013-06-19: 10:00:00 via INTRAVENOUS

## 2013-06-18 MED ORDER — LEVOFLOXACIN IN D5W 750 MG/150ML IV SOLN
750.0000 mg | INTRAVENOUS | Status: DC
  Administered 2013-06-18 – 2013-06-19 (×2): 750 mg via INTRAVENOUS
  Filled 2013-06-18 (×3): qty 150

## 2013-06-18 MED ORDER — ALBUTEROL SULFATE (2.5 MG/3ML) 0.083% IN NEBU: 2.5000 mg | INHALATION_SOLUTION | RESPIRATORY_TRACT | Status: DC | PRN

## 2013-06-18 MED ORDER — ACETAMINOPHEN 650 MG RE SUPP
650.0000 mg | Freq: Four times a day (QID) | RECTAL | Status: DC | PRN
Start: 2013-06-18 — End: 2013-06-20

## 2013-06-18 MED ORDER — GUAIFENESIN-DM 100-10 MG/5ML PO SYRP: 5.0000 mL | ORAL_SOLUTION | ORAL | Status: DC | PRN

## 2013-06-18 MED ORDER — ACETAMINOPHEN 325 MG PO TABS
650.0000 mg | ORAL_TABLET | Freq: Four times a day (QID) | ORAL | Status: DC | PRN
Start: 2013-06-18 — End: 2013-06-20

## 2013-06-18 MED ORDER — SODIUM CHLORIDE 0.9 % IV BOLUS (SEPSIS)
500.0000 mL | Freq: Once | INTRAVENOUS | Status: AC
  Administered 2013-06-18: 500 mL via INTRAVENOUS

## 2013-06-18 MED ORDER — PREDNISONE 20 MG PO TABS
60.0000 mg | ORAL_TABLET | Freq: Once | ORAL | Status: AC
  Administered 2013-06-18: 60 mg via ORAL
  Filled 2013-06-18: qty 3

## 2013-06-18 MED ORDER — ALBUTEROL SULFATE HFA 108 (90 BASE) MCG/ACT IN AERS: 2.0000 | INHALATION_SPRAY | Freq: Four times a day (QID) | RESPIRATORY_TRACT | Status: DC | PRN

## 2013-06-18 MED ORDER — IPRATROPIUM-ALBUTEROL 0.5-2.5 (3) MG/3ML IN SOLN
3.0000 mL | RESPIRATORY_TRACT | Status: DC
  Administered 2013-06-18: 3 mL via RESPIRATORY_TRACT
  Filled 2013-06-18: qty 3

## 2013-06-18 MED ORDER — RIVAROXABAN 15 MG PO TABS
15.0000 mg | ORAL_TABLET | Freq: Two times a day (BID) | ORAL | Status: DC
  Administered 2013-06-18 – 2013-06-19 (×2): 15 mg via ORAL
  Filled 2013-06-18 (×4): qty 1

## 2013-06-18 NOTE — ED Provider Notes (Signed)
CSN: [id number]     Arrival date & time 06/18/13  0903 History   First MD Initiated Contact with Patient 06/18/13 [phone]     Chief Complaint  Patient presents with  . Asthma     (Consider location/radiation/quality/duration/timing/severity/associated sxs/prior Treatment) HPI Comments: Patient with a history of asthma and COPD presents with wheezing shortness of breath. He was seen here on March 10 and was prescribed a seven-day course of Levaquin as well as a prednisone dose pack. T2 is feeling better and until this past Saturday when he started noticing an increase in his wheezing and shortness of breath. He states he's been coughing up yellow sputum. He some pain in the Center of his chest which she tripped to the coughing. He denies any fevers or chills. He does have a history of DVT/PE. He had an IVC filter in place and is on Xarelto.  He has some intermittent cramping in his calves but denies any current pain or swelling.  Patient is a 53 y.o. male presenting with asthma.  Asthma Associated symptoms include chest pain and shortness of breath. Pertinent negatives include no abdominal pain and no headaches.    Past Medical History  Diagnosis Date  . Asthma   . DVT (deep venous thrombosis)   . Pulmonary embolism    Past Surgical History  Procedure Laterality Date  . Ivc filter     Family History  Problem Relation Age of Onset  . Asthma Mother   . Allergies Mother   . Allergies Sister   . Allergies Brother    History  Substance Use Topics  . Smoking status: Current Some Day Smoker -- 0.20 packs/day for 30 years    Types: Cigarettes  . Smokeless tobacco: Never Used  . Alcohol Use: 0.0 oz/week     Comment: drinks 2 40oz beers weekly, drinks about a pint of liquor    Review of Systems  Constitutional: Positive for fatigue. Negative for fever, chills and diaphoresis.  HENT: Negative for congestion, rhinorrhea and sneezing.   Eyes: Negative.   Respiratory: Positive for cough  and shortness of breath. Negative for chest tightness.   Cardiovascular: Positive for chest pain. Negative for leg swelling.  Gastrointestinal: Negative for nausea, vomiting, abdominal pain, diarrhea and blood in stool.  Genitourinary: Negative for frequency, hematuria, flank pain and difficulty urinating.  Musculoskeletal: Negative for arthralgias and back pain.  Skin: Negative for rash.  Neurological: Negative for dizziness, speech difficulty, weakness, numbness and headaches.      Allergies  Review of patient's allergies indicates no known allergies.  Home Medications   Current Outpatient Rx  Name  Route  Sig  Dispense  Refill  . albuterol (PROVENTIL HFA;VENTOLIN HFA) 108 (90 BASE) MCG/ACT inhaler   Inhalation   Inhale 2 puffs into the lungs every 6 (six) hours as needed for wheezing or shortness of breath.   1 Inhaler   0   . predniSONE (DELTASONE) 20 MG tablet   Oral   Take 2 tablets (40 mg total) by mouth daily. Take 40 mg by mouth daily for 3 days, then 20mg  by mouth daily for 3 days, then 10mg  daily for 3 days   12 tablet   0   . Rivaroxaban (XARELTO) 15 MG TABS tablet   Oral   Take 15 mg by mouth 2 (two) times daily with a meal.          BP 114/97  Pulse 85  Temp(Src) 97.8 F (36.6 C) (Oral)  Resp  20  SpO2 94% Physical Exam  Constitutional: He is oriented to person, place, and time. He appears well-developed and well-nourished. He appears distressed.  Increased work of breathing, talking in short sentences  HENT:  Head: Normocephalic and atraumatic.  Eyes: Pupils are equal, round, and reactive to light.  Neck: Normal range of motion. Neck supple.  Cardiovascular: Normal rate, regular rhythm and normal heart sounds.   Pulmonary/Chest: Effort normal. No respiratory distress. He has wheezes. He has no rales. He exhibits no tenderness.  Patient has tachypnea. He has diffuse rhonchi and expiratory wheezing.  Abdominal: Soft. Bowel sounds are normal. There is  no tenderness. There is no rebound and no guarding.  Musculoskeletal: Normal range of motion. He exhibits no edema.  No leg swelling or calf tenderness  Lymphadenopathy:    He has no cervical adenopathy.  Neurological: He is alert and oriented to person, place, and time.  Skin: Skin is warm and dry. No rash noted.  Psychiatric: He has a normal mood and affect.    ED Course  Procedures (including critical care time) Labs Review Labs Reviewed  CBC WITH DIFFERENTIAL - Abnormal; Notable for the following:    Platelets 113 (*)    All other components within normal limits  I-STAT CHEM 8, ED - Abnormal; Notable for the following:    BUN 27 (*)    Glucose, Bld 103 (*)    All other components within normal limits   Imaging Review Dg Chest 2 View  06/18/2013   CLINICAL DATA:  Shortness of breath.  History of asthma.  EXAM: CHEST  2 VIEW  COMPARISON:  06/03/2013.  FINDINGS: Moderate hyperinflation. No infiltrates or failure. No effusion or pneumothorax. Normal cardiomediastinal silhouette. Negative osseous structures. Stable appearance from priors.  IMPRESSION: Moderate hyperinflation.  No active disease.   Electronically Signed   By: Rolla Flatten M.D.   On: 06/18/2013 10:43     EKG Interpretation   Date/Time:  Wednesday June 18 2013 09:25:05 EDT Ventricular Rate:  105 PR Interval:  151 QRS Duration: 83 QT Interval:  336 QTC Calculation: 444 R Axis:   -5 Text Interpretation:  Sinus tachycardia Probable left atrial enlargement  ST elev, probable normal early repol pattern since last tracing no  significant change Confirmed by Krishang Reading  MD, Rohnan Bartleson ([id number]) on 06/18/2013  9:28:29 AM      MDM   Final diagnoses:  Asthma    Patient has received to get under treatment by EMS and one in the ED. He was also given steroids. He still little tachypneic and a little short of breath and request that he be admitted for observation. His oxygen saturations are okay but he does have a little bit  increased work of breathing. He was admitted to the hospitalist service.    Malvin Johns, MD 06/18/13 [phone]

## 2013-06-18 NOTE — H&P (Signed)
[facility] History and Physical  Zackeriah Kissler RAQ:[id number] DOB: 01-22-1961 DOA: 06/18/2013  Referring physician: Dr Tamera Punt PCP: [name], MD   Chief Complaint:  Shortness of breath with wheezing for past 2-3 days  HPI:  53 year old male with history of asthma, COPD, DVT and PE on xarelto, tobacco and alcohol abuse who presented to the hospital with persistent shortness of breath and wheezing for past 2-3 days not relieved with albuterol inhalers at home. Patient reports feeling tired and dizzy as well. He reports having productive cough with yellowish  phlegm. He denies any recent travel or sick contact. Patient denies headache, dizziness, fever, chills, nausea , vomiting, chest pain, palpitations,  abdominal pain, bowel or urinary symptoms. Denies change in weight or appetite.  Course in the ED Patient appeared short of breath and wheezing on exam. He was given IV normal saline, oral prednisone and 1 round of  nebulizer without much improvement. [facility] called for admission and observation  Review of Systems:  Constitutional: Denies fever, chills, diaphoresis, appetite change.  Fatigue+.  HEENT: Denies  congestion, sore throat, rhinorrhea, sneezing,  trouble swallowing, neck pain, Respiratory: SOB, DOE, cough, chest tightness,  and wheezing.   Cardiovascular: Denies chest pain, palpitations and leg swelling.  Gastrointestinal: Denies nausea, vomiting, abdominal pain, diarrhea, constipation, blood in stool and abdominal distention.  Genitourinary: Denies dysuria, urgency, frequency, hematuria, flank pain and difficulty urinating.  Endocrine: Denies hot or cold intolerance, polyuria, polydipsia. Musculoskeletal: Denies myalgias, back pain. Skin: Denies pallor, rash and wound.  Neurological: Denies dizziness, seizures, syncope, weakness, light-headedness, numbness and headaches.     Past Medical History  Diagnosis Date  . Asthma   . DVT (deep venous  thrombosis)   . Pulmonary embolism    Past Surgical History  Procedure Laterality Date  . Ivc filter     Social History:  reports that he has been smoking Cigarettes.  He has a 6 pack-year smoking history. He has never used smokeless tobacco. He reports that he drinks alcohol. He reports that he does not use illicit drugs.  No Known Allergies  Family History  Problem Relation Age of Onset  . Asthma Mother   . Allergies Mother   . Allergies Sister   . Allergies Brother     Prior to Admission medications   Medication Sig Start Date End Date Taking? Authorizing Provider  albuterol (PROVENTIL HFA;VENTOLIN HFA) 108 (90 BASE) MCG/ACT inhaler Inhale 2 puffs into the lungs every 6 (six) hours as needed for wheezing or shortness of breath. 03/28/13  Yes Orson Eva, MD  predniSONE (DELTASONE) 20 MG tablet Take 2 tablets (40 mg total) by mouth daily. Take 40 mg by mouth daily for 3 days, then 20mg  by mouth daily for 3 days, then 10mg  daily for 3 days 06/03/13  Yes Alvina Chou, PA-C  Rivaroxaban (XARELTO) 15 MG TABS tablet Take 15 mg by mouth 2 (two) times daily with a meal.   Yes Historical Provider, MD     Physical Exam:  Filed Vitals:   06/18/13 0906 06/18/13 1128 06/18/13 1325  BP: 117/84  114/97  Pulse: 114  85  Temp: 99.6 F (37.6 C)  97.8 F (36.6 C)  TempSrc: Oral  Oral  Resp:   20  SpO2: 100% 94% 94%    Constitutional: Vital signs reviewed.  Patient is thin built male in no acute distress. HEENT: temporal wasting,  no pallor, no icterus, moist oral mucosa, no cervical lymphadenopathy Cardiovascular: RRR, S1 normal, S2 normal, no MRG  Chest: Diminished breath sounds bilaterally, bilateral rhonchi and wheezes diffusely Abdominal: Soft. Non-tender, non-distended, bowel sounds are normal,  Ext: warm, no edema Neurological: A&O x3, non focal  Labs on Admission:  Basic Metabolic Panel:  Recent Labs Lab 06/18/13 1004  NA 138  K 4.0  CL 103  GLUCOSE 103*  BUN 27*   CREATININE 0.90   Liver Function Tests: No results found for this basename: AST, ALT, ALKPHOS, BILITOT, PROT, ALBUMIN,  in the last 168 hours No results found for this basename: LIPASE, AMYLASE,  in the last 168 hours No results found for this basename: AMMONIA,  in the last 168 hours CBC:  Recent Labs Lab 06/18/13 1000 06/18/13 1004  WBC 6.9  --   NEUTROABS 5.0  --   HGB 14.1 16.0  HCT 41.9 47.0  MCV 93.3  --   PLT 113*  --    Cardiac Enzymes: No results found for this basename: CKTOTAL, CKMB, CKMBINDEX, TROPONINI,  in the last 168 hours BNP: No components found with this basename: POCBNP,  CBG: No results found for this basename: GLUCAP,  in the last 168 hours  Radiological Exams on Admission: Dg Chest 2 View  06/18/2013   CLINICAL DATA:  Shortness of breath.  History of asthma.  EXAM: CHEST  2 VIEW  COMPARISON:  06/03/2013.  FINDINGS: Moderate hyperinflation. No infiltrates or failure. No effusion or pneumothorax. Normal cardiomediastinal silhouette. Negative osseous structures. Stable appearance from priors.  IMPRESSION: Moderate hyperinflation.  No active disease.   Electronically Signed   By: Rolla Flatten M.D.   On: 06/18/2013 10:43     Assessment/Plan  Principal Problem: Acute respiratory failure secondary to asthma and COPD exacerbation Admit under observation to med surg I will start him on IV Solu Medrol 60 mg daily 8 hours, duonebs every 4 hours, albuterol nebs q 2 hours PRN. maintaing o2 sat on RA at present -Empiric Levaquin -supportive care with IV fluids and antitussives. -Patient is only on albuterol inhaler as outpatient. He will need spiriva and steroid inhaler in addition upon discharge.  Active Problems:   Personal history of pulmonary embolism and DVT Continue xarelto. Patient reports being compliant to medication.    Thrombocytopenia, unspecified Mild. Possibly related to alcohol abuse.  monitor    Tobacco abuse Reports smoking 1 cigarette  every other day. Counseled on smoking cessation     ETOH abuse Denies recent etoh use. Will monitor    Malnutrition Nutrition consult in am   Diet:regular  DVT prophylaxis: on xarelto for chr DVT and PE   Code Status: full code Family Communication: discussed with patient at bedside Disposition Plan: home tomorrow if improving  Louellen Molder [facility] Pager [phone]  Total time spent on admission :50 minutes  If 7PM-7AM, please contact night-coverage [email or web address] Password [facility] 06/18/2013, 3:31 PM

## 2013-06-18 NOTE — ED Notes (Signed)
Bed: ZS82 Expected date:  Expected time:  Means of arrival:  Comments: asthma

## 2013-06-18 NOTE — ED Notes (Addendum)
Pt. Walked around the emergency room well. He stated that he felt a little SOB and light headed while ambulating. His oxygen stats were 95 to 97 percent throughout the entire walk. His heart rate was around 120 to 127 through the entire walk.

## 2013-06-18 NOTE — ED Notes (Addendum)
Per EMS: pt c/o of asthma since Sunday off and on. Tx with inhaler states tired of asthma today cannot get rid of it. Pt very rhonchi. 125 solumedrol .5 Atrovent and 5 albuterol given in route. NAD

## 2013-06-19 DIAGNOSIS — D696 Thrombocytopenia, unspecified: Secondary | ICD-10-CM

## 2013-06-19 DIAGNOSIS — J45901 Unspecified asthma with (acute) exacerbation: Secondary | ICD-10-CM | POA: Diagnosis not present

## 2013-06-19 DIAGNOSIS — J441 Chronic obstructive pulmonary disease with (acute) exacerbation: Secondary | ICD-10-CM | POA: Diagnosis not present

## 2013-06-19 LAB — BASIC METABOLIC PANEL
BUN: 20 mg/dL (ref 6–23)
CO2: 22 mEq/L (ref 19–32)
CREATININE: 0.65 mg/dL (ref 0.50–1.35)
Calcium: 9.1 mg/dL (ref 8.4–10.5)
Chloride: 103 mEq/L (ref 96–112)
GFR calc Af Amer: >90 mL/min (ref >90)
GFR calc non Af Amer: >90 mL/min (ref >90)
GLUCOSE: 135 mg/dL — AB (ref 70–99)
POTASSIUM: 4.4 meq/L (ref 3.7–5.3)
Sodium: 136 mEq/L — ABNORMAL LOW (ref 137–147)

## 2013-06-19 LAB — CBC
HCT: 36.2 % — ABNORMAL LOW (ref 39.0–52.0)
HEMOGLOBIN: 12.2 g/dL — AB (ref 13.0–17.0)
MCH: 31.5 pg (ref 26.0–34.0)
MCHC: 33.7 g/dL (ref 30.0–36.0)
MCV: 93.5 fL (ref 78.0–100.0)
Platelets: 101 10*3/uL — ABNORMAL LOW (ref 150–400)
RBC: 3.87 MIL/uL — ABNORMAL LOW (ref 4.22–5.81)
RDW: 13.8 % (ref 11.5–15.5)
WBC: 6.4 10*3/uL (ref 4.0–10.5)

## 2013-06-19 MED ORDER — ADULT MULTIVITAMIN LIQUID CH
5.0000 mL | Freq: Every day | ORAL | Status: DC
  Administered 2013-06-19 – 2013-06-20 (×2): 5 mL via ORAL
  Filled 2013-06-19 (×2): qty 5

## 2013-06-19 MED ORDER — IPRATROPIUM-ALBUTEROL 0.5-2.5 (3) MG/3ML IN SOLN
3.0000 mL | Freq: Four times a day (QID) | RESPIRATORY_TRACT | Status: DC
  Administered 2013-06-19 – 2013-06-20 (×5): 3 mL via RESPIRATORY_TRACT
  Filled 2013-06-19 (×5): qty 3

## 2013-06-19 MED ORDER — ALBUTEROL SULFATE (2.5 MG/3ML) 0.083% IN NEBU
2.5000 mg | INHALATION_SOLUTION | RESPIRATORY_TRACT | Status: DC | PRN
Start: 2013-06-19 — End: 2013-06-20
  Administered 2013-06-19: 2.5 mg via RESPIRATORY_TRACT
  Filled 2013-06-19: qty 3

## 2013-06-19 MED ORDER — RIVAROXABAN 20 MG PO TABS
20.0000 mg | ORAL_TABLET | Freq: Every day | ORAL | Status: DC
  Administered 2013-06-20: 20 mg via ORAL
  Filled 2013-06-19 (×2): qty 1

## 2013-06-19 MED ORDER — FAMOTIDINE 20 MG PO TABS
20.0000 mg | ORAL_TABLET | Freq: Two times a day (BID) | ORAL | Status: DC | PRN
  Administered 2013-06-19: 20 mg via ORAL
  Filled 2013-06-19: qty 1

## 2013-06-19 NOTE — Progress Notes (Signed)
INITIAL NUTRITION ASSESSMENT  DOCUMENTATION CODES Per approved criteria  -Non-severe (moderate) malnutrition in the context of social or environmental circumstances  Pt meets criteria for moderate MALNUTRITION in the context of social circumstance as evidenced by PO intake <75% for > 3 months, moderate muscle wasting in temporal, anterior thigh and scapular region.   INTERVENTION: -Recommend Carn[employer] Instant Breakfast once daily -Discussed ways to increase kcal/protein intake -Encouraged healthy, balanced meals -Recommend addition of multivitamins  NUTRITION DIAGNOSIS: Unintentional weight loss related to inadequate energy intake as evidenced by 29 lbs weight loss in one year.   Goal: Pt to meet >/= 90% of their estimated nutrition needs    Monitor:  Total protein/energy intake, labs, weights  Reason for Assessment: Consult to Assess  53 y.o. male  Admitting Dx: Asthma exacerbation  ASSESSMENT: 53 year old male with history of asthma, COPD, DVT and PE on xarelto, tobacco and alcohol abuse who presented to the hospital with persistent shortness of breath and wheezing for past 2-3 days not relieved with albuterol inhalers at home. Patient reports feeling tired and dizzy as well. He reports having productive cough with yellowish phlegm  -Pt reported poor diet quality for 6-7 years pta -Diet recall indicated pt consuming 1-2 meals of fast food or processed convienence foods. Snacks include high fat/salt foods-snack pies, chips, soda -Pt endorsed unintentional wt loss over past 6-7 years. Weighed 220 lbs. Weight has been declining since. Has been able to stabilize weight around 160 lbs for past 6 months or so per medical records -Pt indicated he is unable to cook at home.  -Discussed addition of snacks/supplement to assist in weight maintenance -Pt reported trying Ensure supplement previously,and did not like taste and was unable to afford them on a consistent basis -Was willing to  try [employer] as he tolerates them better than other supplement alternatives -Encouraged pt to increase fruit/vegetable intake. Poor skin quality and diet recall indicates pt likely vitamin/mineral deficient. Will order MVI -Current appetite is good, eating 100% of meals -Pt seemed eager and willing to begin making small changes to improve diet quality -Nutrition Focused Physical Exam:  Subcutaneous Fat:  Orbital Region: WDL Upper Arm Region: moderate Thoracic and Lumbar Region: WDL  Muscle:  Temple Region: moderate Clavicle Bone Region: WDL Clavicle and Acromion Bone Region: WDL Scapular Bone Region: moderate Dorsal Hand: WDL Patellar Region: WDL Anterior Thigh Region: moderate Posterior Calf Region: WDL  Edema: none    Height: Ht Readings from Last 1 Encounters:  06/18/13 6\' 2"  (1.88 m)    Weight: Wt Readings from Last 1 Encounters:  06/18/13 156 lb 15.5 oz (71.2 kg)    Ideal Body Weight: 190 lbs  % Ideal Body Weight: 82%  Wt Readings from Last 10 Encounters:  06/18/13 156 lb 15.5 oz (71.2 kg)  06/03/13 160 lb (72.576 kg)  03/25/13 170 lb (77.111 kg)  02/22/13 159 lb 9.8 oz (72.4 kg)  08/08/12 180 lb (81.647 kg)  05/28/12 185 lb 3.2 oz (84.006 kg)  04/01/12 200 lb 6.4 oz (90.901 kg)  02/14/12 185 lb 13.6 oz (84.3 kg)    Usual Body Weight: 160 lbs  % Usual Body Weight: 98%  BMI:  Body mass index is 20.14 kg/(m^2).  Estimated Nutritional Needs: Kcal: 1800-2000 Protein: 75-85 gram Fluid: >/=2100 ml/daily  Skin: WDL  Diet Order: General  EDUCATION NEEDS: -Education needs addressed   Intake/Output Summary (Last 24 hours) at 06/19/13 1355 Last data filed at 06/19/13 1300  Gross per 24 hour  Intake  1665 ml  Output      0 ml  Net   1665 ml    Last BM: 3/25   Labs:   Recent Labs Lab 06/18/13 1004 06/19/13 0455  NA 138 136*  K 4.0 4.4  CL 103 103  CO2  --  22  BUN 27* 20  CREATININE 0.90 0.65  CALCIUM  --  9.1   GLUCOSE 103* 135*    CBG (last 3)  No results found for this basename: GLUCAP,  in the last 72 hours  Scheduled Meds: . ipratropium-albuterol  3 mL Nebulization Q6H  . levofloxacin (LEVAQUIN) IV  750 mg Intravenous Q24H  . methylPREDNISolone (SOLU-MEDROL) injection  60 mg Intravenous 3 times per day  . multivitamin  5 mL Oral Daily  . [START ON 06/20/2013] rivaroxaban  20 mg Oral QAC breakfast    Continuous Infusions: . sodium chloride 75 mL/hr at 06/19/13 1019    Past Medical History  Diagnosis Date  . Asthma   . DVT (deep venous thrombosis)   . Pulmonary embolism     Past Surgical History  Procedure Laterality Date  . Ivc filter      Atlee Abide MS RD LDN Clinical Dietitian [phone]

## 2013-06-19 NOTE — Progress Notes (Signed)
[facility] PROGRESS NOTE  [name] [id number]:[id number] DOB: Apr 02, 1960 DOA: 06/18/2013 PCP: [name] Junior, MD   Brief narrative 53 year old male with history of asthma, COPD, DVT and PE on xarelto, tobacco and alcohol abuse who presented to the hospital with persistent shortness of breath and wheezing for past 2-3 days not relieved with albuterol inhalers at home. Patient reports feeling tired and dizzy as well. He reports having productive cough with yellowish phlegm.  Assessment/Plan: Acute respiratory failure secondary to asthma and COPD exacerbation  Still wheezy this am -continue  IV Solu Medrol 60 mg daily 8 hours, duonebs every 4 hours, albuterol nebs q 2 hours PRN.  maintaing o2 sat on RA at present  -Empiric Levaquin  -supportive care with antitussives.  -Patient is only on albuterol inhaler as outpatient. He will need spiriva and steroid inhaler in addition upon discharge.   Active Problems:   Personal history of pulmonary embolism and DVT  Continue xarelto. Patient reports being compliant to medication.   Thrombocytopenia, unspecified  Mild. Possibly related to alcohol abuse. monitor   Tobacco abuse  Reports smoking 1 cigarette every other day. Counseled on smoking cessation   ETOH abuse  Denies recent etoh use. Will monitor   Malnutrition  Nutrition consult  Diet:regular   DVT prophylaxis: on xarelto for chronic DVT and PE   Code Status: full code   Family Communication: discussed with patient at bedside   Disposition Plan: home tomorrow if improves   HPI/Subjective: Still has SOB and wheezing  Objective: Filed Vitals:   06/19/13 0600  BP: 146/90  Pulse: 71  Temp: 98 F (36.7 C)  Resp: 18    Intake/Output Summary (Last 24 hours) at 06/19/13 1325 Last data filed at 06/19/13 0900  Gross per 24 hour  Intake    240 ml  Output      0 ml  Net    240 ml   Filed Weights   06/18/13 1839  Weight: 71.2 kg (156 lb 15.5 oz)     Exam: FGH:[id number] is thin built male in no acute distress.  HEENT: temporal wasting, no pallor, no icterus, moist oral mucosa,  Cardiovascular: RRR, S1 normal, S2 normal, no MRG  Chest: Diminished breath sounds bilaterally, bilateral rhonchi and wheezes diffusely  Abdominal: Soft. Non-tender, non-distended, bowel sounds are normal,  Ext: warm, no edema Neurological: A&O x3, non focal   Data Reviewed: Basic Metabolic Panel:  Recent Labs Lab 06/18/13 1004 06/19/13 0455  NA 138 136*  K 4.0 4.4  CL 103 103  CO2  --  22  GLUCOSE 103* 135*  BUN 27* 20  CREATININE 0.90 0.65  CALCIUM  --  9.1   Liver Function Tests: No results found for this basename: AST, ALT, ALKPHOS, BILITOT, PROT, ALBUMIN,  in the last 168 hours No results found for this basename: LIPASE, AMYLASE,  in the last 168 hours No results found for this basename: AMMONIA,  in the last 168 hours CBC:  Recent Labs Lab 06/18/13 1000 06/18/13 1004 06/19/13 0455  WBC 6.9  --  6.4  NEUTROABS 5.0  --   --   HGB 14.1 16.0 12.2*  HCT 41.9 47.0 36.2*  MCV 93.3  --  93.5  PLT 113*  --  101*   Cardiac Enzymes: No results found for this basename: CKTOTAL, CKMB, CKMBINDEX, TROPONINI,  in the last 168 hours BNP (last 3 results) No results found for this basename: PROBNP,  in the last 8760 hours CBG: No results found for  this basename: GLUCAP,  in the last 168 hours  No results found for this or any previous visit (from the past 240 hour(s)).   Studies: Dg Chest 2 View  06/18/2013   CLINICAL DATA:  Shortness of breath.  History of asthma.  EXAM: CHEST  2 VIEW  COMPARISON:  06/03/2013.  FINDINGS: Moderate hyperinflation. No infiltrates or failure. No effusion or pneumothorax. Normal cardiomediastinal silhouette. Negative osseous structures. Stable appearance from priors.  IMPRESSION: Moderate hyperinflation.  No active disease.   Electronically Signed   By: [name] M.D.   On: 06/18/2013 10:43    Scheduled  Meds: . ipratropium-albuterol  3 mL Nebulization Q6H  . levofloxacin (LEVAQUIN) IV  750 mg Intravenous Q24H  . methylPREDNISolone (SOLU-MEDROL) injection  60 mg Intravenous 3 times per day  . [START ON 06/20/2013] rivaroxaban  20 mg Oral QAC breakfast   Continuous Infusions: . sodium chloride 75 mL/hr at 06/19/13 1019      Time spent: 25 minutes    [name], [facility] Pager [phone] If 7PM-7AM, please contact night-coverage at [email or web address], password [facility] 06/19/2013, 1:25 PM  LOS: 1 day

## 2013-06-19 NOTE — Progress Notes (Signed)
UR completed. Patient changed to inpatient- requiring IVF @ 75cc/hr and IV solumedrol

## 2013-06-20 DIAGNOSIS — D649 Anemia, unspecified: Secondary | ICD-10-CM | POA: Diagnosis not present

## 2013-06-20 DIAGNOSIS — J45901 Unspecified asthma with (acute) exacerbation: Secondary | ICD-10-CM | POA: Diagnosis not present

## 2013-06-20 DIAGNOSIS — J9600 Acute respiratory failure, unspecified whether with hypoxia or hypercapnia: Secondary | ICD-10-CM | POA: Diagnosis not present

## 2013-06-20 DIAGNOSIS — D61818 Other pancytopenia: Secondary | ICD-10-CM | POA: Diagnosis present

## 2013-06-20 DIAGNOSIS — E440 Moderate protein-calorie malnutrition: Secondary | ICD-10-CM | POA: Diagnosis present

## 2013-06-20 DIAGNOSIS — J441 Chronic obstructive pulmonary disease with (acute) exacerbation: Secondary | ICD-10-CM | POA: Diagnosis not present

## 2013-06-20 LAB — SAVE SMEAR

## 2013-06-20 LAB — CBC
HEMATOCRIT: 32.9 % — AB (ref 39.0–52.0)
HEMOGLOBIN: 11.2 g/dL — AB (ref 13.0–17.0)
MCH: 31.2 pg (ref 26.0–34.0)
MCHC: 34 g/dL (ref 30.0–36.0)
MCV: 91.6 fL (ref 78.0–100.0)
Platelets: 88 10*3/uL — ABNORMAL LOW (ref 150–400)
RBC: 3.59 MIL/uL — ABNORMAL LOW (ref 4.22–5.81)
RDW: 13.4 % (ref 11.5–15.5)
WBC: 2.9 10*3/uL — ABNORMAL LOW (ref 4.0–10.5)

## 2013-06-20 LAB — OCCULT BLOOD X 1 CARD TO LAB, STOOL: FECAL OCCULT BLD: NEGATIVE

## 2013-06-20 LAB — HIV ANTIBODY (ROUTINE TESTING W REFLEX): HIV: NONREACTIVE

## 2013-06-20 LAB — VITAMIN B12: Vitamin B-12: 483 pg/mL (ref 211–911)

## 2013-06-20 LAB — IRON AND TIBC
Iron: <10 ug/dL — ABNORMAL LOW (ref 42–135)
UIBC: 175 ug/dL (ref 125–400)

## 2013-06-20 LAB — TSH: TSH: 0.16 u[IU]/mL — ABNORMAL LOW (ref 0.350–4.500)

## 2013-06-20 MED ORDER — FLUTICASONE-SALMETEROL 250-50 MCG/DOSE IN AEPB: 1.0000 | INHALATION_SPRAY | Freq: Two times a day (BID) | RESPIRATORY_TRACT | Status: DC

## 2013-06-20 MED ORDER — LEVOFLOXACIN 750 MG PO TABS: 750.0000 mg | ORAL_TABLET | Freq: Every day | ORAL | Status: AC

## 2013-06-20 MED ORDER — GUAIFENESIN-DM 100-10 MG/5ML PO SYRP
5.0000 mL | ORAL_SOLUTION | ORAL | Status: DC | PRN
Start: 2013-06-20 — End: 2013-10-21

## 2013-06-20 MED ORDER — PREDNISONE 20 MG PO TABS: 40.0000 mg | ORAL_TABLET | Freq: Every day | ORAL | Status: DC

## 2013-06-20 MED ORDER — TIOTROPIUM BROMIDE MONOHYDRATE 18 MCG IN CAPS: 18.0000 ug | ORAL_CAPSULE | Freq: Every day | RESPIRATORY_TRACT | Status: DC

## 2013-06-20 MED ORDER — RIVAROXABAN 20 MG PO TABS: 20.0000 mg | ORAL_TABLET | Freq: Two times a day (BID) | ORAL | Status: DC

## 2013-06-20 MED ORDER — RIVAROXABAN 20 MG PO TABS: 20.0000 mg | ORAL_TABLET | Freq: Every day | ORAL | Status: DC

## 2013-06-20 NOTE — Discharge Summary (Addendum)
Physician Discharge Summary  [name] NKN:[id number] DOB: 1960/08/15 DOA: 06/18/2013  PCP: [name], MD  Admit date: 06/18/2013 Discharge date: 06/20/2013  Time spent: 40  minutes  Recommendations for Outpatient Follow-up:  1. Home with outpt PCP follow up 2. Please follow up repeat CBC ( for anemia and pancytopenia as outpt).  3. Please follow up results for TSH, B12, HIV ab, stool for occult blood and iron panel.  4. Patient will need referral to outpatient GI given anemia and unintentional weight loss of almost 25-30 lbs over past 1 year.  Discharge Diagnoses:  Principal Problem:   Asthma exacerbation  Active Problems:   COPD exacerbation   Asthma   Personal history of pulmonary embolism   Thrombocytopenia, unspecified   Tobacco abuse   ETOH abuse   Malnutrition   Malnutrition of moderate degree   Anemia   Other pancytopenia   Discharge Condition: fair  Diet recommendation: regular  Filed Weights   06/18/13 1839  Weight: 71.2 kg (156 lb 15.5 oz)    History of present illness:  Please refer to admission H&P for details, but in brief, 53 year old male with history of asthma, COPD, DVT and PE on xarelto, tobacco and alcohol abuse who presented to the hospital with persistent shortness of breath and wheezing for past 2-3 days not relieved with albuterol inhalers at home. Patient reports feeling tired and dizzy as well. He reports having productive cough with yellowish phlegm. Patient admitted for asthma exacerbation.   Hospital Course:   Acute respiratory failure secondary to asthma and COPD exacerbation  -placed on  IV Solu Medrol 60 mg daily 8 hours, duonebs every 4 hours, albuterol nebs q 2 hours PRN.  maintaing o2 sat on RA -Empiric Levaquin  -supportive care with antitussives.  -Patient is only on albuterol inhaler as outpatient. Will add  Daily  spiriva and bid advair  inhaler in addition upon discharge.  -Symptoms much improved.  - will  discharge on a short prednisone taper. complete 5 day of antibiotics with levaquin.   Active Problems:  Personal history of pulmonary embolism and DVT  Continue xarelto. Patient reports being compliant to medication.   Thrombocytopenia,  with pancytopenia  On reviewing labs this seems chronic and runs in low 100s, drooped to  8 today. This is likely in the setting of etoh abuse and possibly superimposed viral bronchitis this admission.  Possibly related to alcohol abuse.  Has chronic leucopenia as well.  Patient noted to have drop in H&H this am. Check labs for TSH, B12 and iron panel. Check HIV ab Stool for occult blood ordered and needs to be followed as outpt  patient reports no hx of colonoscopy. He has had almost 25-30 lbs of unintentional wt loss over past 1 year. Needs referral to outpt GI.  Tobacco abuse  Reports smoking 1 cigarette every other day. Counseled on smoking cessation   ETOH abuse  Denies recent etoh use. No signs of withdrawal  Moderate Malnutrition  Nutrition consult appreciated. Arranged for carnation breakfast supplement daily. Needs outpt GI referral for weight loss.  Diet:regular   Code Status: full code  Family Communication: discussed with patient at bedside  Disposition Plan: home with outpt follow up  Procedures:  none  Consultations:  none  Discharge Exam: Filed Vitals:   06/20/13 0545  BP: 159/93  Pulse: 71  Temp: 98.1 F (36.7 C)  Resp: 20    FXT:[id number] is thin built male in no acute distress.  HEENT: temporal wasting, no  pallor, no icterus, moist oral mucosa,  Cardiovascular: RRR, S1 normal, S2 normal, no MRG  Chest: clear b/l, few scattered rhonchi Abdominal: Soft. Non-tender, non-distended, bowel sounds are normal,  Ext: warm, no edema Neurological: A&O x3, non focal  Discharge Instructions     Medication List         albuterol 108 (90 BASE) MCG/ACT inhaler  Commonly known as:  PROVENTIL HFA;VENTOLIN HFA  Inhale 2  puffs into the lungs every 6 (six) hours as needed for wheezing or shortness of breath.     Fluticasone-Salmeterol 250-50 MCG/DOSE Aepb  Commonly known as:  ADVAIR DISKUS  Inhale 1 puff into the lungs 2 (two) times daily.     guaiFENesin-dextromethorphan 100-10 MG/5ML syrup  Commonly known as:  ROBITUSSIN DM  Take 5 mLs by mouth every 4 (four) hours as needed for cough.     levofloxacin 750 MG tablet  Commonly known as:  LEVAQUIN  Take 1 tablet (750 mg total) by mouth daily.  Start taking on:  06/21/2013 for 3 days     predniSONE 20 MG tablet  Commonly known as:  DELTASONE  Take 2 tablets (40 mg total) by mouth daily. Take 40 mg by mouth daily for 3 days, then 30mg  by mouth daily for 3 days, then 20mg  daily for 3 days, then 10 mg daily for next 3 days then stop     Rivaroxaban 20 MG Tabs tablet  Commonly known as:  XARELTO  Take 1 tablet (20 mg total)  daily with a meal.     tiotropium 18 MCG inhalation capsule  Commonly known as:  SPIRIVA HANDIHALER  Place 1 capsule (18 mcg total) into inhaler and inhale daily.       No Known Allergies     Follow-up Information   Follow up with [name], MD In 1 week. (please follow CBC, b12 , tsh and iron )    Specialty:  Specialist   Contact information:   [address] [place] [phone]        The results of significant diagnostics from this hospitalization (including imaging, microbiology, ancillary and laboratory) are listed below for reference.    Significant Diagnostic Studies: Dg Chest 2 View  06/18/2013   CLINICAL DATA:  Shortness of breath.  History of asthma.  EXAM: CHEST  2 VIEW  COMPARISON:  06/03/2013.  FINDINGS: Moderate hyperinflation. No infiltrates or failure. No effusion or pneumothorax. Normal cardiomediastinal silhouette. Negative osseous structures. Stable appearance from priors.  IMPRESSION: Moderate hyperinflation.  No active disease.   Electronically Signed   By: Rolla Flatten M.D.    On: 06/18/2013 10:43   Dg Chest 2 View (if Patient Has Fever And/or Copd)  06/03/2013   CLINICAL DATA Shortness of breath.  EXAM CHEST  2 VIEW  COMPARISON 03/25/2013  FINDINGS Pulmonary hyperinflation. No infiltrate or edema. No effusion or pneumothorax. Normal heart size. IVC filter partly imaged.  IMPRESSION No active cardiopulmonary disease.  SIGNATURE  Electronically Signed   By: Jorje Guild M.D.   On: 06/03/2013 13:54    Microbiology: No results found for this or any previous visit (from the past 240 hour(s)).   Labs: Basic Metabolic Panel:  Recent Labs Lab 06/18/13 1004 06/19/13 0455  NA 138 136*  K 4.0 4.4  CL 103 103  CO2  --  22  GLUCOSE 103* 135*  BUN 27* 20  CREATININE 0.90 0.65  CALCIUM  --  9.1   Liver Function Tests: No results  found for this basename: AST, ALT, ALKPHOS, BILITOT, PROT, ALBUMIN,  in the last 168 hours No results found for this basename: LIPASE, AMYLASE,  in the last 168 hours No results found for this basename: AMMONIA,  in the last 168 hours CBC:  Recent Labs Lab 06/18/13 1000 06/18/13 1004 06/19/13 0455 06/20/13 0425  WBC 6.9  --  6.4 2.9*  NEUTROABS 5.0  --   --   --   HGB 14.1 16.0 12.2* 11.2*  HCT 41.9 47.0 36.2* 32.9*  MCV 93.3  --  93.5 91.6  PLT 113*  --  101* 88*   Cardiac Enzymes: No results found for this basename: CKTOTAL, CKMB, CKMBINDEX, TROPONINI,  in the last 168 hours BNP: BNP (last 3 results) No results found for this basename: PROBNP,  in the last 8760 hours CBG: No results found for this basename: GLUCAP,  in the last 168 hours     Signed:  Chandy Tarman  [facility] 06/20/2013, 11:20 AM

## 2013-06-20 NOTE — Progress Notes (Signed)
06/20/13 1215  Reviewed discharge instructions with patient.  Patient verbalized understanding of discharge instructions. Copy of discharge papers and prescriptions given to patient.

## 2013-06-20 NOTE — Discharge Instructions (Signed)

## 2013-10-20 ENCOUNTER — Inpatient Hospital Stay (HOSPITAL_COMMUNITY)
Admission: EM | Admit: 2013-10-20 | Discharge: 2013-10-27 | DRG: 248 | Disposition: A | Discharge status: Home / Self Care | Payer: Medicare Other | Attending: Internal Medicine | Admitting: Internal Medicine

## 2013-10-20 ENCOUNTER — Emergency Department (HOSPITAL_COMMUNITY): Payer: Medicare Other

## 2013-10-20 ENCOUNTER — Encounter (HOSPITAL_COMMUNITY): Payer: Self-pay | Admitting: Emergency Medicine

## 2013-10-20 DIAGNOSIS — J45909 Unspecified asthma, uncomplicated: Secondary | ICD-10-CM | POA: Diagnosis present

## 2013-10-20 DIAGNOSIS — I5020 Unspecified systolic (congestive) heart failure: Secondary | ICD-10-CM

## 2013-10-20 DIAGNOSIS — I25110 Atherosclerotic heart disease of native coronary artery with unstable angina pectoris: Secondary | ICD-10-CM

## 2013-10-20 DIAGNOSIS — Z825 Family history of asthma and other chronic lower respiratory diseases: Secondary | ICD-10-CM

## 2013-10-20 DIAGNOSIS — I2510 Atherosclerotic heart disease of native coronary artery without angina pectoris: Secondary | ICD-10-CM | POA: Diagnosis present

## 2013-10-20 DIAGNOSIS — Z7902 Long term (current) use of antithrombotics/antiplatelets: Secondary | ICD-10-CM

## 2013-10-20 DIAGNOSIS — I5021 Acute systolic (congestive) heart failure: Secondary | ICD-10-CM

## 2013-10-20 DIAGNOSIS — IMO0002 Reserved for concepts with insufficient information to code with codable children: Secondary | ICD-10-CM

## 2013-10-20 DIAGNOSIS — R079 Chest pain, unspecified: Secondary | ICD-10-CM | POA: Diagnosis not present

## 2013-10-20 DIAGNOSIS — I5022 Chronic systolic (congestive) heart failure: Secondary | ICD-10-CM | POA: Diagnosis present

## 2013-10-20 DIAGNOSIS — E43 Unspecified severe protein-calorie malnutrition: Secondary | ICD-10-CM | POA: Insufficient documentation

## 2013-10-20 DIAGNOSIS — I2582 Chronic total occlusion of coronary artery: Secondary | ICD-10-CM | POA: Diagnosis present

## 2013-10-20 DIAGNOSIS — L209 Atopic dermatitis, unspecified: Secondary | ICD-10-CM

## 2013-10-20 DIAGNOSIS — J441 Chronic obstructive pulmonary disease with (acute) exacerbation: Secondary | ICD-10-CM

## 2013-10-20 DIAGNOSIS — I959 Hypotension, unspecified: Secondary | ICD-10-CM | POA: Diagnosis not present

## 2013-10-20 DIAGNOSIS — Z7901 Long term (current) use of anticoagulants: Secondary | ICD-10-CM

## 2013-10-20 DIAGNOSIS — I214 Non-ST elevation (NSTEMI) myocardial infarction: Secondary | ICD-10-CM | POA: Diagnosis not present

## 2013-10-20 DIAGNOSIS — J449 Chronic obstructive pulmonary disease, unspecified: Secondary | ICD-10-CM | POA: Diagnosis present

## 2013-10-20 DIAGNOSIS — D696 Thrombocytopenia, unspecified: Secondary | ICD-10-CM

## 2013-10-20 DIAGNOSIS — F1010 Alcohol abuse, uncomplicated: Secondary | ICD-10-CM

## 2013-10-20 DIAGNOSIS — I25709 Atherosclerosis of coronary artery bypass graft(s), unspecified, with unspecified angina pectoris: Secondary | ICD-10-CM

## 2013-10-20 DIAGNOSIS — R52 Pain, unspecified: Secondary | ICD-10-CM | POA: Diagnosis not present

## 2013-10-20 DIAGNOSIS — I509 Heart failure, unspecified: Secondary | ICD-10-CM | POA: Diagnosis present

## 2013-10-20 DIAGNOSIS — J4531 Mild persistent asthma with (acute) exacerbation: Secondary | ICD-10-CM

## 2013-10-20 DIAGNOSIS — Z91199 Patient's noncompliance with other medical treatment and regimen due to unspecified reason: Secondary | ICD-10-CM

## 2013-10-20 DIAGNOSIS — R0602 Shortness of breath: Secondary | ICD-10-CM | POA: Diagnosis not present

## 2013-10-20 DIAGNOSIS — I82409 Acute embolism and thrombosis of unspecified deep veins of unspecified lower extremity: Secondary | ICD-10-CM

## 2013-10-20 DIAGNOSIS — I2583 Coronary atherosclerosis due to lipid rich plaque: Secondary | ICD-10-CM

## 2013-10-20 DIAGNOSIS — Z9119 Patient's noncompliance with other medical treatment and regimen: Secondary | ICD-10-CM

## 2013-10-20 DIAGNOSIS — Z789 Other specified health status: Secondary | ICD-10-CM

## 2013-10-20 DIAGNOSIS — J45901 Unspecified asthma with (acute) exacerbation: Secondary | ICD-10-CM

## 2013-10-20 DIAGNOSIS — J42 Unspecified chronic bronchitis: Secondary | ICD-10-CM | POA: Diagnosis not present

## 2013-10-20 DIAGNOSIS — J410 Simple chronic bronchitis: Secondary | ICD-10-CM

## 2013-10-20 DIAGNOSIS — Z79899 Other long term (current) drug therapy: Secondary | ICD-10-CM

## 2013-10-20 DIAGNOSIS — R51 Headache: Secondary | ICD-10-CM | POA: Diagnosis not present

## 2013-10-20 DIAGNOSIS — R0600 Dyspnea, unspecified: Secondary | ICD-10-CM

## 2013-10-20 DIAGNOSIS — Z955 Presence of coronary angioplasty implant and graft: Secondary | ICD-10-CM

## 2013-10-20 DIAGNOSIS — I82403 Acute embolism and thrombosis of unspecified deep veins of lower extremity, bilateral: Secondary | ICD-10-CM

## 2013-10-20 DIAGNOSIS — I10 Essential (primary) hypertension: Secondary | ICD-10-CM

## 2013-10-20 DIAGNOSIS — Z720 Tobacco use: Secondary | ICD-10-CM | POA: Diagnosis present

## 2013-10-20 DIAGNOSIS — Z86718 Personal history of other venous thrombosis and embolism: Secondary | ICD-10-CM

## 2013-10-20 DIAGNOSIS — K219 Gastro-esophageal reflux disease without esophagitis: Secondary | ICD-10-CM | POA: Diagnosis present

## 2013-10-20 DIAGNOSIS — F17200 Nicotine dependence, unspecified, uncomplicated: Secondary | ICD-10-CM | POA: Diagnosis present

## 2013-10-20 DIAGNOSIS — Z86711 Personal history of pulmonary embolism: Secondary | ICD-10-CM | POA: Diagnosis present

## 2013-10-20 DIAGNOSIS — J438 Other emphysema: Secondary | ICD-10-CM | POA: Diagnosis not present

## 2013-10-20 LAB — CBC WITH DIFFERENTIAL/PLATELET
BASOS ABS: 0 10*3/uL (ref 0.0–0.1)
Basophils Relative: 0 % (ref 0–1)
Eosinophils Absolute: 0.3 10*3/uL (ref 0.0–0.7)
Eosinophils Relative: 6 % — ABNORMAL HIGH (ref 0–5)
HCT: 43.5 % (ref 39.0–52.0)
Hemoglobin: 14.7 g/dL (ref 13.0–17.0)
LYMPHS ABS: 1.7 10*3/uL (ref 0.7–4.0)
Lymphocytes Relative: 32 % (ref 12–46)
MCH: 32.3 pg (ref 26.0–34.0)
MCHC: 33.8 g/dL (ref 30.0–36.0)
MCV: 95.6 fL (ref 78.0–100.0)
MONO ABS: 0.5 10*3/uL (ref 0.1–1.0)
Monocytes Relative: 9 % (ref 3–12)
Neutro Abs: 2.7 10*3/uL (ref 1.7–7.7)
Neutrophils Relative %: 53 % (ref 43–77)
Platelets: 158 10*3/uL (ref 150–400)
RBC: 4.55 MIL/uL (ref 4.22–5.81)
RDW: 14.3 % (ref 11.5–15.5)
WBC: 5.1 10*3/uL (ref 4.0–10.5)

## 2013-10-20 LAB — COMPREHENSIVE METABOLIC PANEL
ALT: 26 U/L (ref 0–53)
ANION GAP: 18 — AB (ref 5–15)
AST: 39 U/L — ABNORMAL HIGH (ref 0–37)
Albumin: 3.9 g/dL (ref 3.5–5.2)
Alkaline Phosphatase: 93 U/L (ref 39–117)
BUN: 15 mg/dL (ref 6–23)
CALCIUM: 10.1 mg/dL (ref 8.4–10.5)
CO2: 19 mEq/L (ref 19–32)
CREATININE: 1.21 mg/dL (ref 0.50–1.35)
Chloride: 104 mEq/L (ref 96–112)
GFR calc non Af Amer: 67 mL/min — ABNORMAL LOW (ref >90)
GFR, EST AFRICAN AMERICAN: 77 mL/min — AB (ref >90)
Glucose, Bld: 121 mg/dL — ABNORMAL HIGH (ref 70–99)
Potassium: 3.9 mEq/L (ref 3.7–5.3)
Sodium: 141 mEq/L (ref 137–147)
Total Bilirubin: 0.5 mg/dL (ref 0.3–1.2)
Total Protein: 8 g/dL (ref 6.0–8.3)

## 2013-10-20 LAB — I-STAT TROPONIN, ED: Troponin i, poc: 0 ng/mL (ref 0.00–0.08)

## 2013-10-20 MED ORDER — ALBUTEROL SULFATE (2.5 MG/3ML) 0.083% IN NEBU
5.0000 mg | INHALATION_SOLUTION | Freq: Once | RESPIRATORY_TRACT | Status: AC
  Administered 2013-10-20: 5 mg via RESPIRATORY_TRACT
  Filled 2013-10-20: qty 6

## 2013-10-20 MED ORDER — NITROGLYCERIN 0.4 MG SL SUBL
0.4000 mg | SUBLINGUAL_TABLET | SUBLINGUAL | Status: DC | PRN
  Administered 2013-10-20: 0.4 mg via SUBLINGUAL

## 2013-10-20 MED ORDER — SODIUM CHLORIDE 0.9 % IV BOLUS (SEPSIS)
1000.0000 mL | Freq: Once | INTRAVENOUS | Status: AC
  Administered 2013-10-20: 1000 mL via INTRAVENOUS

## 2013-10-20 MED ORDER — MORPHINE SULFATE 4 MG/ML IJ SOLN
6.0000 mg | Freq: Once | INTRAMUSCULAR | Status: AC
  Administered 2013-10-20: 6 mg via INTRAVENOUS
  Filled 2013-10-20: qty 2

## 2013-10-20 MED ORDER — IOHEXOL 350 MG/ML SOLN
100.0000 mL | Freq: Once | INTRAVENOUS | Status: AC | PRN
  Administered 2013-10-21: 100 mL via INTRAVENOUS

## 2013-10-20 MED ORDER — PREDNISONE 20 MG PO TABS
60.0000 mg | ORAL_TABLET | Freq: Once | ORAL | Status: AC
  Administered 2013-10-20: 60 mg via ORAL
  Filled 2013-10-20: qty 3

## 2013-10-20 NOTE — ED Notes (Signed)
Pt to ED via [facility]- pt was standing denies exerting himself when he experienced a sudden onset of midsternal chest pain that began about 45 minutes ago- shortness of breath and diaphoresis associated with symptoms.  Pt has had 324 ASA and 2 Nitro prior to arrival, pain 8/10 upon arrival to ED.  Denies radiation of pain- sinus rhythm noted on monitor.

## 2013-10-21 ENCOUNTER — Observation Stay (HOSPITAL_COMMUNITY): Payer: Medicare Other

## 2013-10-21 ENCOUNTER — Encounter (HOSPITAL_COMMUNITY): Payer: Self-pay | Admitting: Cardiology

## 2013-10-21 ENCOUNTER — Encounter (HOSPITAL_COMMUNITY)
Admission: EM | Disposition: A | Discharge status: Home / Self Care | Payer: Medicare Other | Source: Home / Self Care | Attending: Internal Medicine

## 2013-10-21 ENCOUNTER — Encounter (HOSPITAL_COMMUNITY)
Admission: EM | Disposition: A | Discharge status: Home / Self Care | Payer: Self-pay | Source: Home / Self Care | Attending: Internal Medicine

## 2013-10-21 DIAGNOSIS — R51 Headache: Secondary | ICD-10-CM | POA: Diagnosis not present

## 2013-10-21 DIAGNOSIS — Z7901 Long term (current) use of anticoagulants: Secondary | ICD-10-CM | POA: Diagnosis not present

## 2013-10-21 DIAGNOSIS — J45909 Unspecified asthma, uncomplicated: Secondary | ICD-10-CM | POA: Diagnosis not present

## 2013-10-21 DIAGNOSIS — I10 Essential (primary) hypertension: Secondary | ICD-10-CM | POA: Diagnosis present

## 2013-10-21 DIAGNOSIS — I2510 Atherosclerotic heart disease of native coronary artery without angina pectoris: Secondary | ICD-10-CM

## 2013-10-21 DIAGNOSIS — R072 Precordial pain: Secondary | ICD-10-CM | POA: Diagnosis not present

## 2013-10-21 DIAGNOSIS — Z86711 Personal history of pulmonary embolism: Secondary | ICD-10-CM | POA: Diagnosis not present

## 2013-10-21 DIAGNOSIS — I82409 Acute embolism and thrombosis of unspecified deep veins of unspecified lower extremity: Secondary | ICD-10-CM

## 2013-10-21 DIAGNOSIS — E43 Unspecified severe protein-calorie malnutrition: Secondary | ICD-10-CM | POA: Diagnosis not present

## 2013-10-21 DIAGNOSIS — I5020 Unspecified systolic (congestive) heart failure: Secondary | ICD-10-CM

## 2013-10-21 DIAGNOSIS — I214 Non-ST elevation (NSTEMI) myocardial infarction: Secondary | ICD-10-CM | POA: Diagnosis present

## 2013-10-21 DIAGNOSIS — I959 Hypotension, unspecified: Secondary | ICD-10-CM | POA: Diagnosis not present

## 2013-10-21 DIAGNOSIS — I2582 Chronic total occlusion of coronary artery: Secondary | ICD-10-CM | POA: Diagnosis not present

## 2013-10-21 DIAGNOSIS — R0602 Shortness of breath: Secondary | ICD-10-CM | POA: Diagnosis not present

## 2013-10-21 DIAGNOSIS — J449 Chronic obstructive pulmonary disease, unspecified: Secondary | ICD-10-CM | POA: Diagnosis present

## 2013-10-21 DIAGNOSIS — J441 Chronic obstructive pulmonary disease with (acute) exacerbation: Secondary | ICD-10-CM

## 2013-10-21 DIAGNOSIS — F1010 Alcohol abuse, uncomplicated: Secondary | ICD-10-CM | POA: Diagnosis present

## 2013-10-21 DIAGNOSIS — R079 Chest pain, unspecified: Secondary | ICD-10-CM | POA: Diagnosis not present

## 2013-10-21 DIAGNOSIS — F17200 Nicotine dependence, unspecified, uncomplicated: Secondary | ICD-10-CM | POA: Diagnosis present

## 2013-10-21 DIAGNOSIS — Z86718 Personal history of other venous thrombosis and embolism: Secondary | ICD-10-CM | POA: Diagnosis not present

## 2013-10-21 DIAGNOSIS — R109 Unspecified abdominal pain: Secondary | ICD-10-CM | POA: Diagnosis not present

## 2013-10-21 DIAGNOSIS — Z7902 Long term (current) use of antithrombotics/antiplatelets: Secondary | ICD-10-CM | POA: Diagnosis not present

## 2013-10-21 DIAGNOSIS — R112 Nausea with vomiting, unspecified: Secondary | ICD-10-CM | POA: Diagnosis not present

## 2013-10-21 DIAGNOSIS — R0609 Other forms of dyspnea: Secondary | ICD-10-CM

## 2013-10-21 DIAGNOSIS — I509 Heart failure, unspecified: Secondary | ICD-10-CM | POA: Diagnosis present

## 2013-10-21 DIAGNOSIS — R0989 Other specified symptoms and signs involving the circulatory and respiratory systems: Secondary | ICD-10-CM | POA: Diagnosis not present

## 2013-10-21 DIAGNOSIS — I5021 Acute systolic (congestive) heart failure: Secondary | ICD-10-CM | POA: Diagnosis not present

## 2013-10-21 DIAGNOSIS — Z79899 Other long term (current) drug therapy: Secondary | ICD-10-CM | POA: Diagnosis not present

## 2013-10-21 DIAGNOSIS — I25810 Atherosclerosis of coronary artery bypass graft(s) without angina pectoris: Secondary | ICD-10-CM | POA: Diagnosis not present

## 2013-10-21 DIAGNOSIS — IMO0002 Reserved for concepts with insufficient information to code with codable children: Secondary | ICD-10-CM | POA: Diagnosis not present

## 2013-10-21 DIAGNOSIS — I5022 Chronic systolic (congestive) heart failure: Secondary | ICD-10-CM | POA: Diagnosis present

## 2013-10-21 DIAGNOSIS — Z825 Family history of asthma and other chronic lower respiratory diseases: Secondary | ICD-10-CM | POA: Diagnosis not present

## 2013-10-21 DIAGNOSIS — K219 Gastro-esophageal reflux disease without esophagitis: Secondary | ICD-10-CM | POA: Diagnosis present

## 2013-10-21 HISTORY — PX: LEFT HEART CATHETERIZATION WITH CORONARY ANGIOGRAM: SHX5451

## 2013-10-21 HISTORY — PX: CORONARY ANGIOPLASTY WITH STENT PLACEMENT: SHX49

## 2013-10-21 LAB — COMPREHENSIVE METABOLIC PANEL
ALT: 22 U/L (ref 0–53)
AST: 41 U/L — ABNORMAL HIGH (ref 0–37)
Albumin: 3.2 g/dL — ABNORMAL LOW (ref 3.5–5.2)
Alkaline Phosphatase: 80 U/L (ref 39–117)
Anion gap: 13 (ref 5–15)
BUN: 12 mg/dL (ref 6–23)
CALCIUM: 9 mg/dL (ref 8.4–10.5)
CO2: 21 meq/L (ref 19–32)
Chloride: 104 mEq/L (ref 96–112)
Creatinine, Ser: 0.89 mg/dL (ref 0.50–1.35)
GFR calc Af Amer: >90 mL/min (ref >90)
GFR calc non Af Amer: >90 mL/min (ref >90)
Glucose, Bld: 128 mg/dL — ABNORMAL HIGH (ref 70–99)
Potassium: 3.7 mEq/L (ref 3.7–5.3)
SODIUM: 138 meq/L (ref 137–147)
TOTAL PROTEIN: 6.8 g/dL (ref 6.0–8.3)
Total Bilirubin: 0.4 mg/dL (ref 0.3–1.2)

## 2013-10-21 LAB — PLATELET COUNT: Platelets: 277 10*3/uL (ref 150–400)

## 2013-10-21 LAB — CBC
HCT: 36.7 % — ABNORMAL LOW (ref 39.0–52.0)
HEMOGLOBIN: 12.2 g/dL — AB (ref 13.0–17.0)
MCH: 31.6 pg (ref 26.0–34.0)
MCHC: 33.2 g/dL (ref 30.0–36.0)
MCV: 95.1 fL (ref 78.0–100.0)
Platelets: 146 10*3/uL — ABNORMAL LOW (ref 150–400)
RBC: 3.86 MIL/uL — AB (ref 4.22–5.81)
RDW: 14.4 % (ref 11.5–15.5)
WBC: 4.9 10*3/uL (ref 4.0–10.5)

## 2013-10-21 LAB — TROPONIN I
TROPONIN I: 4.06 ng/mL — AB (ref <0.30)
Troponin I: 0.43 ng/mL (ref <0.30)
Troponin I: 2.24 ng/mL (ref <0.30)
Troponin I: 7.06 ng/mL (ref <0.30)

## 2013-10-21 LAB — MRSA PCR SCREENING: MRSA by PCR: NEGATIVE

## 2013-10-21 LAB — POCT ACTIVATED CLOTTING TIME
Activated Clotting Time: 152 seconds
Activated Clotting Time: 236 seconds
Activated Clotting Time: 270 seconds

## 2013-10-21 LAB — LIPASE, BLOOD: Lipase: 15 U/L (ref 11–59)

## 2013-10-21 LAB — APTT: aPTT: 25 seconds (ref 24–37)

## 2013-10-21 LAB — HEPARIN LEVEL (UNFRACTIONATED)

## 2013-10-21 SURGERY — LEFT HEART CATHETERIZATION WITH CORONARY ANGIOGRAM
Anesthesia: LOCAL

## 2013-10-21 MED ORDER — MORPHINE SULFATE 2 MG/ML IJ SOLN
2.0000 mg | INTRAMUSCULAR | Status: DC | PRN
  Administered 2013-10-21 – 2013-10-23 (×9): 2 mg via INTRAVENOUS
  Filled 2013-10-21 (×9): qty 1

## 2013-10-21 MED ORDER — ATORVASTATIN CALCIUM 40 MG PO TABS: 40.0000 mg | ORAL_TABLET | Freq: Every day | ORAL | Status: DC

## 2013-10-21 MED ORDER — TIROFIBAN HCL IV 5 MG/100ML
0.1500 ug/kg/min | INTRAVENOUS | Status: AC
  Administered 2013-10-21: 0.15 ug/kg/min via INTRAVENOUS
  Filled 2013-10-21: qty 100

## 2013-10-21 MED ORDER — HEPARIN SODIUM (PORCINE) 1000 UNIT/ML IJ SOLN
INTRAMUSCULAR | Status: AC
  Filled 2013-10-21: qty 1

## 2013-10-21 MED ORDER — SODIUM CHLORIDE 0.9 % IJ SOLN: 3.0000 mL | INTRAMUSCULAR | Status: DC | PRN

## 2013-10-21 MED ORDER — VERAPAMIL HCL 2.5 MG/ML IV SOLN
INTRAVENOUS | Status: AC
  Filled 2013-10-21: qty 2

## 2013-10-21 MED ORDER — SODIUM CHLORIDE 0.9 % IV SOLN: 250.0000 mL | INTRAVENOUS | Status: DC | PRN

## 2013-10-21 MED ORDER — LISINOPRIL 5 MG PO TABS
5.0000 mg | ORAL_TABLET | Freq: Every day | ORAL | Status: DC
  Administered 2013-10-21 – 2013-10-24 (×4): 5 mg via ORAL
  Filled 2013-10-21 (×4): qty 1

## 2013-10-21 MED ORDER — RIVAROXABAN 20 MG PO TABS: 20.0000 mg | ORAL_TABLET | Freq: Every day | ORAL | Status: DC

## 2013-10-21 MED ORDER — SODIUM CHLORIDE 0.9 % IJ SOLN
3.0000 mL | INTRAMUSCULAR | Status: DC | PRN
Start: 2013-10-21 — End: 2013-10-21

## 2013-10-21 MED ORDER — ACETAMINOPHEN 325 MG PO TABS
650.0000 mg | ORAL_TABLET | Freq: Four times a day (QID) | ORAL | Status: DC | PRN
  Administered 2013-10-22 – 2013-10-24 (×3): 650 mg via ORAL
  Filled 2013-10-21 (×3): qty 2

## 2013-10-21 MED ORDER — HEPARIN BOLUS VIA INFUSION
2000.0000 [IU] | Freq: Once | INTRAVENOUS | Status: AC
  Administered 2013-10-21: 2000 [IU] via INTRAVENOUS
  Filled 2013-10-21: qty 2000

## 2013-10-21 MED ORDER — SODIUM CHLORIDE 0.9 % IV SOLN: 1.0000 mL/kg/h | INTRAVENOUS | Status: AC

## 2013-10-21 MED ORDER — HEPARIN (PORCINE) IN NACL 100-0.45 UNIT/ML-% IJ SOLN
1100.0000 [IU]/h | INTRAMUSCULAR | Status: DC
  Administered 2013-10-21: 1100 [IU]/h via INTRAVENOUS
  Filled 2013-10-21: qty 250

## 2013-10-21 MED ORDER — SODIUM CHLORIDE 0.9 % IV SOLN
1.0000 mL/kg/h | INTRAVENOUS | Status: DC
  Administered 2013-10-21: 1 mL/kg/h via INTRAVENOUS

## 2013-10-21 MED ORDER — ASPIRIN 81 MG PO CHEW
81.0000 mg | CHEWABLE_TABLET | Freq: Every day | ORAL | Status: DC
  Administered 2013-10-22: 81 mg via ORAL

## 2013-10-21 MED ORDER — SODIUM CHLORIDE 0.9 % IJ SOLN: 3.0000 mL | Freq: Two times a day (BID) | INTRAMUSCULAR | Status: DC

## 2013-10-21 MED ORDER — METOPROLOL TARTRATE 12.5 MG HALF TABLET
12.5000 mg | ORAL_TABLET | Freq: Two times a day (BID) | ORAL | Status: DC
  Administered 2013-10-21 – 2013-10-22 (×3): 12.5 mg via ORAL
  Filled 2013-10-21 (×4): qty 1

## 2013-10-21 MED ORDER — ASPIRIN 325 MG PO TABS: 325.0000 mg | ORAL_TABLET | Freq: Every day | ORAL | Status: DC

## 2013-10-21 MED ORDER — BIVALIRUDIN 250 MG IV SOLR
INTRAVENOUS | Status: AC
  Filled 2013-10-21: qty 250

## 2013-10-21 MED ORDER — SODIUM CHLORIDE 0.9 % IV SOLN
INTRAVENOUS | Status: DC
  Administered 2013-10-21: 10 mL/h via INTRAVENOUS
  Administered 2013-10-22: 10 mL via INTRAVENOUS

## 2013-10-21 MED ORDER — ONDANSETRON HCL 4 MG/2ML IJ SOLN: 4.0000 mg | Freq: Four times a day (QID) | INTRAMUSCULAR | Status: DC | PRN

## 2013-10-21 MED ORDER — ALBUTEROL SULFATE (2.5 MG/3ML) 0.083% IN NEBU: 5.0000 mg | INHALATION_SOLUTION | RESPIRATORY_TRACT | Status: DC | PRN

## 2013-10-21 MED ORDER — FENTANYL CITRATE 0.05 MG/ML IJ SOLN
INTRAMUSCULAR | Status: AC
  Filled 2013-10-21: qty 2

## 2013-10-21 MED ORDER — ATORVASTATIN CALCIUM 80 MG PO TABS
80.0000 mg | ORAL_TABLET | Freq: Every day | ORAL | Status: DC
  Administered 2013-10-21 – 2013-10-27 (×7): 80 mg via ORAL
  Filled 2013-10-21 (×7): qty 1

## 2013-10-21 MED ORDER — LIDOCAINE HCL (PF) 1 % IJ SOLN
INTRAMUSCULAR | Status: AC
  Filled 2013-10-21: qty 30

## 2013-10-21 MED ORDER — HEPARIN (PORCINE) IN NACL 2-0.9 UNIT/ML-% IJ SOLN
INTRAMUSCULAR | Status: AC
  Filled 2013-10-21: qty 1000

## 2013-10-21 MED ORDER — NITROGLYCERIN IN D5W 200-5 MCG/ML-% IV SOLN
2.0000 ug/min | INTRAVENOUS | Status: DC
  Administered 2013-10-21: 5 ug/min via INTRAVENOUS
  Filled 2013-10-21: qty 250

## 2013-10-21 MED ORDER — NITROGLYCERIN 1 MG/10 ML FOR IR/CATH LAB
INTRA_ARTERIAL | Status: AC
  Filled 2013-10-21: qty 10

## 2013-10-21 MED ORDER — SODIUM CHLORIDE 0.9 % IJ SOLN
3.0000 mL | Freq: Two times a day (BID) | INTRAMUSCULAR | Status: DC
  Administered 2013-10-21 – 2013-10-22 (×2): 3 mL via INTRAVENOUS

## 2013-10-21 MED ORDER — HEPARIN (PORCINE) IN NACL 100-0.45 UNIT/ML-% IJ SOLN
1100.0000 [IU]/h | INTRAMUSCULAR | Status: AC
  Administered 2013-10-22: 1100 [IU]/h via INTRAVENOUS
  Filled 2013-10-21 (×2): qty 250

## 2013-10-21 MED ORDER — PRASUGREL HCL 10 MG PO TABS
10.0000 mg | ORAL_TABLET | Freq: Every day | ORAL | Status: DC
  Administered 2013-10-22 – 2013-10-27 (×6): 10 mg via ORAL
  Filled 2013-10-21 (×6): qty 1

## 2013-10-21 MED ORDER — MOMETASONE FURO-FORMOTEROL FUM 100-5 MCG/ACT IN AERO
2.0000 | INHALATION_SPRAY | Freq: Two times a day (BID) | RESPIRATORY_TRACT | Status: DC
  Administered 2013-10-21 – 2013-10-27 (×10): 2 via RESPIRATORY_TRACT
  Filled 2013-10-21 (×2): qty 8.8

## 2013-10-21 MED ORDER — NITROGLYCERIN IN D5W 200-5 MCG/ML-% IV SOLN
2.0000 ug/min | INTRAVENOUS | Status: DC
  Administered 2013-10-21: 30 ug/min via INTRAVENOUS
  Filled 2013-10-21: qty 250

## 2013-10-21 MED ORDER — TIROFIBAN HCL IV 5 MG/100ML
INTRAVENOUS | Status: AC
  Filled 2013-10-21: qty 100

## 2013-10-21 MED ORDER — ASPIRIN 81 MG PO CHEW
81.0000 mg | CHEWABLE_TABLET | ORAL | Status: AC
  Administered 2013-10-21: 81 mg via ORAL
  Filled 2013-10-21: qty 1

## 2013-10-21 MED ORDER — PRASUGREL HCL 10 MG PO TABS
ORAL_TABLET | ORAL | Status: AC
  Filled 2013-10-21: qty 1

## 2013-10-21 MED ORDER — MIDAZOLAM HCL 2 MG/2ML IJ SOLN
INTRAMUSCULAR | Status: AC
  Filled 2013-10-21: qty 2

## 2013-10-21 MED ORDER — HEPARIN (PORCINE) IN NACL 100-0.45 UNIT/ML-% IJ SOLN
900.0000 [IU]/h | INTRAMUSCULAR | Status: DC
  Administered 2013-10-21: 900 [IU]/h via INTRAVENOUS
  Filled 2013-10-21: qty 250

## 2013-10-21 MED ORDER — ONDANSETRON HCL 4 MG/2ML IJ SOLN: 4.0000 mg | Freq: Three times a day (TID) | INTRAMUSCULAR | Status: DC | PRN

## 2013-10-21 MED ORDER — ACETAMINOPHEN 650 MG RE SUPP: 650.0000 mg | Freq: Four times a day (QID) | RECTAL | Status: DC | PRN

## 2013-10-21 NOTE — ED Provider Notes (Signed)
CSN: [id number]     Arrival date & time 10/20/13  2228 History   First MD Initiated Contact with Patient 10/20/13 2300     Chief Complaint  Patient presents with  . Chest Pain     (Consider location/radiation/quality/duration/timing/severity/associated sxs/prior Treatment) HPI Comments: Is a 53 year old male with history of asthma, pulmonary embolism, multiple DVTs, from cefepime you come on Xarelto, alcohol abuse presents with chest pressure and 10:15 today while standing at rest. Patient said a sudden onset he does not recall having symptoms like this in the past. Patient had nausea, shortness of breath and diaphoresis at the time. Patient aspirin nitroglycerin prior to arrival and symptoms are moderate at this time. Patient has had mild exertional shortness of breath for the past week. Patient has asthma however this is more severe than his normal asthma. Patient denies any cardiac history, recent surgeries and overall takes his medicines regularly however occasionally misses his or altered dose. Patient is unsure why his had recurrent blood clots.  Patient is a 53 y.o. male presenting with chest pain. The history is provided by the patient.  Chest Pain Associated symptoms: diaphoresis and shortness of breath   Associated symptoms: no abdominal pain, no back pain, no cough, no fever, no headache and not vomiting     Past Medical History  Diagnosis Date  . Asthma   . DVT (deep venous thrombosis)   . Pulmonary embolism    Past Surgical History  Procedure Laterality Date  . Ivc filter     Family History  Problem Relation Age of Onset  . Asthma Mother   . Allergies Mother   . Allergies Sister   . Allergies Brother    History  Substance Use Topics  . Smoking status: Current Every Day Smoker -- 0.20 packs/day for 30 years    Types: Cigarettes  . Smokeless tobacco: Never Used  . Alcohol Use: 0.0 oz/week     Comment: drinks 2 40oz beers weekly, drinks about a pint of liquor     Review of Systems  Constitutional: Positive for diaphoresis and appetite change. Negative for fever and chills.  HENT: Negative for congestion.   Eyes: Negative for visual disturbance.  Respiratory: Positive for shortness of breath. Negative for cough.   Cardiovascular: Positive for chest pain.  Gastrointestinal: Negative for vomiting and abdominal pain.  Genitourinary: Negative for dysuria and flank pain.  Musculoskeletal: Negative for back pain, neck pain and neck stiffness.  Skin: Negative for rash.  Neurological: Positive for light-headedness. Negative for headaches.      Allergies  Review of patient's allergies indicates no known allergies.  Home Medications   Prior to Admission medications   Medication Sig Start Date End Date Taking? Authorizing Provider  albuterol (PROVENTIL HFA;VENTOLIN HFA) 108 (90 BASE) MCG/ACT inhaler Inhale 2 puffs into the lungs every 6 (six) hours as needed for wheezing or shortness of breath. 03/28/13   Orson Eva, MD  Fluticasone-Salmeterol (ADVAIR DISKUS) 250-50 MCG/DOSE AEPB Inhale 1 puff into the lungs 2 (two) times daily. 06/20/13   Nishant Dhungel, MD  guaiFENesin-dextromethorphan (ROBITUSSIN DM) 100-10 MG/5ML syrup Take 5 mLs by mouth every 4 (four) hours as needed for cough. 06/20/13   Nishant Dhungel, MD  predniSONE (DELTASONE) 20 MG tablet Take 2 tablets (40 mg total) by mouth daily. Take 40 mg by mouth daily for 3 days, then 300mg  by mouth daily for 3 days, then 20mg  daily for 3 days, then 10 mg daily for next 3 days then stop 06/20/13  Nishant Dhungel, MD  Rivaroxaban (XARELTO) 20 MG TABS tablet Take 1 tablet (20 mg total) by mouth daily with supper. 06/20/13   Nishant Dhungel, MD  tiotropium (SPIRIVA HANDIHALER) 18 MCG inhalation capsule Place 1 capsule (18 mcg total) into inhaler and inhale daily. 06/20/13   Nishant Dhungel, MD   BP 147/99  Pulse 88  Temp(Src) 97.9 F (36.6 C)  Resp 21  Ht 6\' 2"  (1.88 m)  Wt 160 lb (72.576 kg)  BMI  20.53 kg/m2  SpO2 98% Physical Exam  Nursing note and vitals reviewed. Constitutional: He is oriented to person, place, and time. He appears well-developed and well-nourished.  HENT:  Head: Normocephalic and atraumatic.  Eyes: Conjunctivae are normal. Right eye exhibits no discharge. Left eye exhibits no discharge.  Neck: Normal range of motion. Neck supple. No tracheal deviation present.  Cardiovascular: Normal rate, regular rhythm and intact distal pulses.   Pulmonary/Chest: Effort normal and breath sounds normal.  Abdominal: Soft. He exhibits no distension. There is no tenderness. There is no guarding.  Musculoskeletal: He exhibits no edema.  Neurological: He is alert and oriented to person, place, and time.  Skin: Skin is warm. No rash noted.  Psychiatric: He has a normal mood and affect.    ED Course  Procedures (including critical care time) CRITICAL CARE Performed by: Mariea Clonts   Total critical care time: 30 min  Critical care time was exclusive of separately billable procedures and treating other patients.  Critical care was necessary to treat or prevent imminent or life-threatening deterioration.  Critical care was time spent personally by me on the following activities: development of treatment plan with patient and/or surrogate as well as nursing, discussions with consultants, evaluation of patient's response to treatment, examination of patient, obtaining history from patient or surrogate, ordering and performing treatments and interventions, ordering and review of laboratory studies, ordering and review of radiographic studies, pulse oximetry and re-evaluation of patient's condition.  Labs Review Labs Reviewed  CBC WITH DIFFERENTIAL - Abnormal; Notable for the following:    Eosinophils Relative 6 (*)    All other components within normal limits  COMPREHENSIVE METABOLIC PANEL - Abnormal; Notable for the following:    Glucose, Bld 121 (*)    AST 39 (*)    GFR  calc non Af Amer 67 (*)    GFR calc Af Amer 77 (*)    Anion gap 18 (*)    All other components within normal limits  TROPONIN I - Abnormal; Notable for the following:    Troponin I 0.43 (*)    All other components within normal limits  HEPARIN LEVEL (UNFRACTIONATED) - Abnormal; Notable for the following:    Heparin Unfractionated <0.10 (*)    All other components within normal limits  TROPONIN I - Abnormal; Notable for the following:    Troponin I 2.24 (*)    All other components within normal limits  TROPONIN I - Abnormal; Notable for the following:    Troponin I 4.06 (*)    All other components within normal limits  TROPONIN I - Abnormal; Notable for the following:    Troponin I 7.06 (*)    All other components within normal limits  CBC - Abnormal; Notable for the following:    RBC 3.86 (*)    Hemoglobin 12.2 (*)    HCT 36.7 (*)    Platelets 146 (*)    All other components within normal limits  COMPREHENSIVE METABOLIC PANEL - Abnormal; Notable for the  following:    Glucose, Bld 128 (*)    Albumin 3.2 (*)    AST 41 (*)    All other components within normal limits  MRSA PCR SCREENING  LIPASE, BLOOD  APTT  PLATELET COUNT  BASIC METABOLIC PANEL  CBC  HEPARIN LEVEL (UNFRACTIONATED)  I-STAT TROPOININ, ED  POCT ACTIVATED CLOTTING TIME  POCT ACTIVATED CLOTTING TIME  POCT ACTIVATED CLOTTING TIME    Imaging Review Dg Chest 2 View  10/20/2013   CLINICAL DATA:  Centralized chest pain, cough, congestion, and sneezing. Thirty year smoker.  EXAM: CHEST  2 VIEW  COMPARISON:  06/18/2013  FINDINGS: Normal heart size and pulmonary vascularity. Mild hyperinflation and central interstitial changes suggesting emphysema and chronic bronchitis. No focal airspace disease or consolidation in the lungs. No blunting of costophrenic angles. No pneumothorax. Degenerative changes in the thoracic spine.  IMPRESSION: Emphysematous changes and chronic bronchitic changes in the lungs. No evidence of  active pulmonary disease.   Electronically Signed   By: Lucienne Capers M.D.   On: 10/20/2013 23:35     EKG Interpretation   Date/Time:  Monday October 20 2013 22:32:40 EDT Ventricular Rate:  76 PR Interval:  185 QRS Duration: 91 QT Interval:  436 QTC Calculation: 490 R Axis:   15 Text Interpretation:  Sinus rhythm Abnormal R-wave progression, early  transition Borderline prolonged QT interval No significant change since  last tracing Confirmed by KNAPP  MD-J, JON ([id number]) on 10/20/2013 10:46:08  PM      MDM   Final diagnoses:  Acute dyspnea  Acute chest pain  Acute asthma exacerbation, mild persistent  NSTEMI (non-ST elevated myocardial infarction)   Patient presents after significant observe chest pressure and diaphoresis discussed differential including pulmonary embolism versus cardiac versus other. Plan for cardiac workup and CT angina the chest this patient is high risk. Nitroglycerin morphine for pain. Patient had aspirin.  On rechecks chest pain persistent. CT angina negative for blood clot. Discussed with medicine for admission for further evaluation. Second troponin came back positive, patient still having chest pain. Medicine consult cardiology and nitro drip applied for pain control. Cardiology evaluated in ER.  The patients results and plan were reviewed and discussed.   Any x-rays performed were personally reviewed by myself.   Differential diagnosis were considered with the presenting HPI.  Medications  nitroGLYCERIN (NITROSTAT) SL tablet 0.4 mg (0.4 mg Sublingual Given 10/20/13 2325)  albuterol (PROVENTIL) (2.5 MG/3ML) 0.083% nebulizer solution 5 mg (not administered)  ondansetron (ZOFRAN) injection 4 mg (not administered)  morphine 2 MG/ML injection 2 mg (2 mg Intravenous Given 10/22/13 0258)  mometasone-formoterol (DULERA) 100-5 MCG/ACT inhaler 2 puff (2 puffs Inhalation Given 10/21/13 2107)  acetaminophen (TYLENOL) tablet 650 mg (not administered)    Or   acetaminophen (TYLENOL) suppository 650 mg (not administered)  metoprolol tartrate (LOPRESSOR) tablet 12.5 mg (12.5 mg Oral Given 10/21/13 2116)  atorvastatin (LIPITOR) tablet 80 mg (80 mg Oral Given 10/21/13 1705)  0.9 %  sodium chloride infusion (10 mLs Intravenous New Bag/Given 10/22/13 0301)  lisinopril (PRINIVIL,ZESTRIL) tablet 5 mg (5 mg Oral Given 10/21/13 0956)  nitroGLYCERIN 0.2 mg/mL in dextrose 5 % infusion (30 mcg/min Intravenous Rate/Dose Verify 10/22/13 0300)  0.9 %  sodium chloride infusion (1 mL/kg/hr  75.7 kg Intravenous Rate/Dose Change 10/21/13 1330)  sodium chloride 0.9 % injection 3 mL (3 mLs Intravenous Given 10/21/13 2117)  sodium chloride 0.9 % injection 3 mL (not administered)  0.9 %  sodium chloride infusion (250 mLs Intravenous Rate/Dose  Verify 10/22/13 0300)  aspirin chewable tablet 81 mg (not administered)  prasugrel (EFFIENT) tablet 10 mg (not administered)  tirofiban (AGGRASTAT) infusion 50 mcg/mL 100 mL (0 mcg/kg/min  75.7 kg Intravenous Stopped 10/22/13 0000)  heparin ADULT infusion 100 units/mL (25000 units/250 mL) (1,100 Units/hr Intravenous Rate/Dose Verify 10/22/13 0300)  morphine 4 MG/ML injection 6 mg (6 mg Intravenous Given 10/20/13 2325)  sodium chloride 0.9 % bolus 1,000 mL (0 mLs Intravenous Stopped 10/21/13 0106)  albuterol (PROVENTIL) (2.5 MG/3ML) 0.083% nebulizer solution 5 mg (5 mg Nebulization Given 10/20/13 2326)  predniSONE (DELTASONE) tablet 60 mg (60 mg Oral Given 10/20/13 2324)  iohexol (OMNIPAQUE) 350 MG/ML injection 100 mL (100 mLs Intravenous Contrast Given 10/21/13 0003)  heparin bolus via infusion 2,000 Units (2,000 Units Intravenous Given 10/21/13 0500)  aspirin chewable tablet 81 mg (81 mg Oral Given 10/21/13 0957)  nitroGLYCERIN 100 MCG/ML intra-arterial injection (not administered)  lidocaine (PF) (XYLOCAINE) 1 % injection (not administered)  heparin 2-0.9 UNIT/ML-% infusion (not administered)  fentaNYL (SUBLIMAZE) 0.05 MG/ML injection (not  administered)  midazolam (VERSED) 2 MG/2ML injection (not administered)  verapamil (ISOPTIN) 2.5 MG/ML injection (not administered)  heparin 1000 UNIT/ML injection (not administered)  bivalirudin (ANGIOMAX) 250 MG injection (not administered)  prasugrel (EFFIENT) 10 MG tablet (not administered)  verapamil (ISOPTIN) 2.5 MG/ML injection (not administered)  tirofiban (AGGRASTAT) 5-0.9 MG/100ML-% injection (not administered)      Filed Vitals:   10/22/13 0100 10/22/13 0200 10/22/13 0300 10/22/13 0400  BP: 112/76 114/69 118/78 127/82  Pulse: 65 69 70 65  Temp:   97.4 F (36.3 C)   TempSrc:   Oral   Resp: 13 19 19 13   Height:      Weight:      SpO2: 96% 95% 97% 97%    Admission/ observation were discussed with the admitting physician, patient and/or family and they are comfortable with the plan.       Mariea Clonts, MD 10/22/13 [phone]

## 2013-10-21 NOTE — H&P (View-Only) (Signed)
Patient ID: Samyak Sackmann, male   DOB: [birth year], 53 y.o.   MRN: [id number]   SUBJECTIVE: Patient still having chest pain, troponin up to 2.24.    Scheduled Meds: . aspirin  325 mg Oral Daily  . atorvastatin  80 mg Oral q1800  . lisinopril  5 mg Oral Daily  . metoprolol tartrate  12.5 mg Oral BID  . mometasone-formoterol  2 puff Inhalation BID   Continuous Infusions: . sodium chloride 10 mL/hr (10/21/13 0451)  . heparin 900 Units/hr (10/21/13 0539)  . nitroGLYCERIN 60 mcg/min (10/21/13 0900)   PRN Meds:.acetaminophen, acetaminophen, albuterol, morphine injection, nitroGLYCERIN, ondansetron (ZOFRAN) IV    Filed Vitals:   10/21/13 0615 10/21/13 0630 10/21/13 0700 10/21/13 0800  BP: 136/95 123/101 134/95 130/89  Pulse: 86 74 68 64  Temp:   97.4 F (36.3 C)   TempSrc:   Oral   Resp: 19 18 9 14   Height:      Weight:      SpO2: 99% 100% 100% 100%    Intake/Output Summary (Last 24 hours) at 10/21/13 0929 Last data filed at 10/21/13 0800  Gross per 24 hour  Intake 109.58 ml  Output      0 ml  Net 109.58 ml    LABS: Basic Metabolic Panel:  Recent Labs  10/20/13 2245 10/21/13 0537  NA 141 138  K 3.9 3.7  CL 104 104  CO2 19 21  GLUCOSE 121* 128*  BUN 15 12  CREATININE 1.21 0.89  CALCIUM 10.1 9.0   Liver Function Tests:  Recent Labs  10/20/13 2245 10/21/13 0537  AST 39* 41*  ALT 26 22  ALKPHOS 93 80  BILITOT 0.5 0.4  PROT 8.0 6.8  ALBUMIN 3.9 3.2*    Recent Labs  10/21/13 0200  LIPASE 15   CBC:  Recent Labs  10/20/13 2245 10/21/13 0537  WBC 5.1 4.9  NEUTROABS 2.7  --   HGB 14.7 12.2*  HCT 43.5 36.7*  MCV 95.6 95.1  PLT 158 146*   Cardiac Enzymes:  Recent Labs  10/21/13 0200 10/21/13 0537  TROPONINI 0.43* 2.24*   BNP: No components found with this basename: POCBNP,  D-Dimer: No results found for this basename: DDIMER,  in the last 72 hours Hemoglobin A1C: No results found for this basename: HGBA1C,  in the last 72  hours Fasting Lipid Panel: No results found for this basename: CHOL, HDL, LDLCALC, TRIG, CHOLHDL, LDLDIRECT,  in the last 72 hours Thyroid Function Tests: No results found for this basename: TSH, T4TOTAL, FREET3, T3FREE, THYROIDAB,  in the last 72 hours Anemia Panel: No results found for this basename: VITAMINB12, FOLATE, FERRITIN, TIBC, IRON, RETICCTPCT,  in the last 72 hours  RADIOLOGY: Dg Chest 2 View  10/20/2013   CLINICAL DATA:  Centralized chest pain, cough, congestion, and sneezing. Thirty year smoker.  EXAM: CHEST  2 VIEW  COMPARISON:  06/18/2013  FINDINGS: Normal heart size and pulmonary vascularity. Mild hyperinflation and central interstitial changes suggesting emphysema and chronic bronchitis. No focal airspace disease or consolidation in the lungs. No blunting of costophrenic angles. No pneumothorax. Degenerative changes in the thoracic spine.  IMPRESSION: Emphysematous changes and chronic bronchitic changes in the lungs. No evidence of active pulmonary disease.   Electronically Signed   By: Lucienne Capers M.D.   On: 10/20/2013 23:35   Ct Angio Chest Pe W/cm &/or Wo Cm  10/21/2013   CLINICAL DATA:  Sudden onset midsternal chest pain and shortness of breath. History of DVT.  EXAM: CT ANGIOGRAPHY CHEST WITH CONTRAST  TECHNIQUE: Multidetector CT imaging of the chest was performed using the standard protocol during bolus administration of intravenous contrast. Multiplanar CT image reconstructions and MIPs were obtained to evaluate the vascular anatomy.  CONTRAST:  100 mL Omnipaque 350  COMPARISON:  08/08/2012  FINDINGS: Technically adequate study with good opacification of the central and segmental pulmonary arteries. No focal filling defects are demonstrated. No evidence of significant pulmonary embolus.  Normal heart size. Normal caliber thoracic aorta. Calcification in the coronary arteries. Esophagus is mostly decompressed. No significant lymphadenopathy in the chest. Emphysematous  changes and scattered interstitial fibrosis in the lungs. Basilar dependent atelectasis. Bronchial wall thickening with peribronchial infiltration consistent with chronic bronchitis/ bronchiolitis. No focal airspace disease or consolidation in the lungs. No pneumothorax. No effusion.  Visualized portions of the upper abdominal organs are grossly unremarkable. Degenerative changes in the thoracic spine. No destructive bone lesions appreciated.  Review of the MIP images confirms the above findings.  IMPRESSION: No evidence of significant pulmonary embolus. Emphysematous and bronchitic changes in the lungs.   Electronically Signed   By: Lucienne Capers M.D.   On: 10/21/2013 00:25   US Abdomen Complete  10/21/2013   CLINICAL DATA:  Abdominal pain, nausea and vomiting.  EXAM: ULTRASOUND ABDOMEN COMPLETE  COMPARISON:  None.  FINDINGS: Gallbladder:  No gallstones or wall thickening visualized. No sonographic Murphy sign noted.  Common bile duct:  Diameter: 3.8 mm, normal  Liver:  No focal lesion identified. Within normal limits in parenchymal echogenicity.  IVC:  No abnormality visualized.  Pancreas:  Visualized portion unremarkable.  Spleen:  Size and appearance within normal limits.  Right Kidney:  Length: 11.1 cm. Echogenicity within normal limits. No mass or hydronephrosis visualized.  Left Kidney:  Length: 12.2 cm. Echogenicity within normal limits. No mass or hydronephrosis visualized.  Abdominal aorta:  Limited visualization.  Overlying bowel gas.  Other findings:  None.  IMPRESSION: No acute abnormalities demonstrated.   Electronically Signed   By: Lucienne Capers M.D.   On: 10/21/2013 03:20    PHYSICAL EXAM General: NAD Neck: No JVD, no thyromegaly or thyroid nodule.  Lungs: Clear to auscultation bilaterally with normal respiratory effort. CV: Nondisplaced PMI.  Heart regular S1/S2, no S3/S4, no murmur.  No peripheral edema.  Abdomen: Soft, nontender, no hepatosplenomegaly, no distention.   Neurologic: Alert and oriented x 3.  Psych: Normal affect. Extremities: No clubbing or cyanosis.   TELEMETRY: Reviewed telemetry pt in NSR  ASSESSMENT AND PLAN: 53 yo smoker with history of multiple DVTs and PEs presented with chest pain and elevated troponin.   1. Chest pain: TnI 2.24, CTA chest negative for PE and he has an IVC filter.  He did not have chest pain with prior PE.  I am concerned for NSTEMI.  - LHC today (last took Xarelto on Sunday).  - Continue heparin gtt, NTG gtt, metoprolol, ASA, statin.  - Echocardiogram 2. H/o PE/DVT: Has IVC filter but not totally compliant with Xarelto (last dose on Sunday).  If needs PCI, will need to be aware that he will need anticoagulation long-term as well.   Loralie Champagne 10/21/2013 9:33 AM

## 2013-10-21 NOTE — Progress Notes (Signed)
ANTICOAGULATION CONSULT NOTE  Pharmacy Consult for Heparin Indication: CAD/hx DVT  No Known Allergies  Patient Measurements: Height: 6\' 2"  (188 cm) Weight: 166 lb 14.2 oz (75.7 kg) IBW/kg (Calculated) : 82.2  Vital Signs: Temp: 97.4 F (36.3 C) (07/28 0700) Temp src: Oral (07/28 0700) BP: 111/75 mmHg (07/28 1430) Pulse Rate: 57 (07/28 1430)  Labs:  Recent Labs  10/20/13 2245 10/21/13 0200 10/21/13 0537 10/21/13 0956  HGB 14.7  --  12.2*  --   HCT 43.5  --  36.7*  --   PLT 158  --  146*  --   APTT  --   --  25  --   HEPARINUNFRC  --   --  <0.10*  --   CREATININE 1.21  --  0.89  --   TROPONINI  --  0.43* 2.24* 4.06*    Estimated Creatinine Clearance: 102.8 ml/min (by C-G formula based on Cr of 0.89).   Medical History: Past Medical History  Diagnosis Date  . Asthma   . DVT (deep venous thrombosis)   . Pulmonary embolism     Medications:  Albuterol  Advair  Xarelto  Assessment: 53 yo male with chest pain, Xarelto on hold but plans are to restart tomorrow night. Orders to continue aggrastat x12 h post cath and restart heparin tonight after TR band removal. No bleeding issues or hematoma noted post cath.   HL this am prior to cath was undetectable, will restart at slightly higher rate.  Goal of Therapy:  Heparin level 0.3-0.7 units/ml Monitor platelets by anticoagulation protocol: Yes   Plan:  Restart heparin tonight at 1100/hr - no bolus given recent cath 6 hour heparin level Continue tirofiban at 0.15 mcg/kg/min for 12 hours Follow up orders for xarelto to restart tomorrow  Redmond Pulling, Marlaine Hind 10/21/2013,2:37 PM

## 2013-10-21 NOTE — Progress Notes (Signed)
Cardiologist made aware about pt's constant chest pain, with order for cardiac cath.

## 2013-10-21 NOTE — Consult Note (Addendum)
CARDIOLOGY CONSULT NOTE   Patient ID: [name] MRN: [id number], DOB/AGE: 1960/05/08   Admit date: 10/20/2013 Date of Consult: 10/21/2013   Primary Physician: [name] Junior, MD Primary Cardiologist: None  Pt. Profile  73M with recurrent DVT/PE s/p IVC filter and poorly controlled asthma who p/w NSTEMI.    Problem List  Past Medical History  Diagnosis Date  . Asthma   . DVT (deep venous thrombosis)   . Pulmonary embolism     Past Surgical History  Procedure Laterality Date  . Ivc filter       Allergies  No Known Allergies  HPI   73M with recurrent DVT/PE s/p IVC filter and poorly controlled asthma who p/w NSTEMI.    [name] was in his USOH until 10:15pm the day of presentation when he was volunteering doing concessions work at the [employer]. He developed acute onset central chest pain with radiation to his abdomen. Pain was 10/10 in severity at onset and felt like "somebody was stepping on me." Denies radiation to the arm, jaw, or back. He noted associated weakness, sweats, dizziness. No palpitations, syncope, pre-syncope.  He endorses chronic LE swelling due to recurrent DVTs and states this is unchanged. No dyspnea.   EMS gave the patient SL NTG x3 without effect. IV morphine decreased the CP from 10/10 to 7/10 in severity. Vital signs normal on arrival. A CT angiogram was performed demonstrating "no evidence of significant pulmonary embolus. Emphysematous and bronchitic changes in the lungs. Calcification of coronary arteries noted." POC Tn was 0.00 on arrival. TnI 3 hours later returned positive at 0.43.   Cardiology was consulted in setting of NSTEMI.   [name] has no prior history of CAD. No FH of CAD. He reports 2 brothers with recurrent DVTs. A 3rd brother and a sister have had no such issues. Mother died of asthma in 6s. Father had some sort of heart issues and what sounds like a pacemaker and died in his 41s.   He has never undergone w/u  for his (what sounds like inherited) clotting disorder. His PCP prescripes him Xarelto which he takes intermittently. Last dose was a few days ago.  He has had no issues with bleeding on Xarelto. IVC filter placed 6-7 years ago per patient.   Inpatient Medications  . aspirin  325 mg Oral Daily  . atorvastatin  80 mg Oral q1800  . metoprolol tartrate  12.5 mg Oral BID    Family History Family History  Problem Relation Age of Onset  . Asthma Mother   . Allergies Mother   . Allergies Sister   . Allergies Brother      Social History History   Social History  . Marital Status: Single    Spouse Name: N/A    Number of Children: N/A  . Years of Education: N/A   Occupational History  . unemployed    Social History Main Topics  . Smoking status: Current Every Day Smoker -- 0.20 packs/day for 30 years    Types: Cigarettes  . Smokeless tobacco: Never Used  . Alcohol Use: 0.0 oz/week     Comment: drinks 2 40oz beers weekly, drinks about a pint of liquor  . Drug Use: No  . Sexual Activity: No   Other Topics Concern  . Not on file   Social History Narrative  . No narrative on file     Review of Systems  General:  No chills, fever, night sweats or weight changes.  Cardiovascular:  See HPO Dermatological: No rash, lesions/masses Respiratory: No cough, dyspnea Urologic: No hematuria, dysuria Abdominal:   No nausea, vomiting, diarrhea, bright red blood per rectum, melena, or hematemesis Neurologic:  No visual changes, wkns, changes in mental status. All other systems reviewed and are otherwise negative except as noted above.  Physical Exam  Blood pressure 171/101, pulse 77, temperature 97.9 F (36.6 C), resp. rate 15, height 6\' 2"  (1.88 m), weight 72.576 kg (160 lb), SpO2 99.00%.  General: Pleasant, in mild distress Psych: Normal affect. Neuro: Alert and oriented X 3. Moves all extremities spontaneously. HEENT: Normal  Neck: Supple without bruits or JVD. Lungs:  Resp  regular and unlabored. Wheezes noted with deep inspirations.  Heart: RRR no s3, s4, or murmurs. Abdomen: Soft, non-tender, non-distended, BS + x 4.  Extremities: No clubbing, cyanosis or edema. DP/PT/Radials 2+ and equal bilaterally.  Labs   Recent Labs  10/21/13 0200  TROPONINI 0.43*   Lab Results  Component Value Date   WBC 5.1 10/20/2013   HGB 14.7 10/20/2013   HCT 43.5 10/20/2013   MCV 95.6 10/20/2013   PLT 158 10/20/2013    Recent Labs Lab 10/20/13 2245  NA 141  K 3.9  CL 104  CO2 19  BUN 15  CREATININE 1.21  CALCIUM 10.1  PROT 8.0  BILITOT 0.5  ALKPHOS 93  ALT 26  AST 39*  GLUCOSE 121*   Lab Results  Component Value Date   CHOL  Value: 169        ATP III CLASSIFICATION:  <200     mg/dL   Desirable  200-239  mg/dL   Borderline High  >=240    mg/dL   High        12/17/2009   HDL 58 12/17/2009   LDLCALC  Value: 103        Total Cholesterol/HDL:CHD Risk Coronary Heart Disease Risk Table                     Men   Women  1/2 Average Risk   3.4   3.3  Average Risk       5.0   4.4  2 X Average Risk   9.6   7.1  3 X Average Risk  23.4   11.0        Use the calculated Patient Ratio above and the CHD Risk Table to determine the patient's CHD Risk.        ATP III CLASSIFICATION (LDL):  <100     mg/dL   Optimal  100-129  mg/dL   Near or Above                    Optimal  130-159  mg/dL   Borderline  160-189  mg/dL   High  >190     mg/dL   Very High* 12/17/2009   TRIG 40 12/17/2009   Lab Results  Component Value Date   DDIMER  Value: 1.74        AT THE INHOUSE ESTABLISHED CUTOFF VALUE OF 0.48 ug/mL FEU, THIS ASSAY HAS BEEN DOCUMENTED IN THE LITERATURE TO HAVE A SENSITIVITY AND NEGATIVE PREDICTIVE VALUE OF AT LEAST 98 TO 99%.  THE TEST RESULT SHOULD BE CORRELATED WITH AN ASSESSMENT OF THE CLINICAL PROBABILITY OF DVT / VTE.* 03/24/2010    Radiology/Studies  Dg Chest 2 View  10/20/2013   CLINICAL DATA:  Centralized chest pain, cough, congestion, and sneezing. Thirty year smoker.   EXAM: CHEST  2 VIEW  COMPARISON:  06/18/2013  FINDINGS: Normal heart size and pulmonary vascularity. Mild hyperinflation and central interstitial changes suggesting emphysema and chronic bronchitis. No focal airspace disease or consolidation in the lungs. No blunting of costophrenic angles. No pneumothorax. Degenerative changes in the thoracic spine.  IMPRESSION: Emphysematous changes and chronic bronchitic changes in the lungs. No evidence of active pulmonary disease.   Electronically Signed   By: Lucienne Capers M.D.   On: 10/20/2013 23:35   Ct Angio Chest Pe W/cm &/or Wo Cm  10/21/2013   CLINICAL DATA:  Sudden onset midsternal chest pain and shortness of breath. History of DVT.  EXAM: CT ANGIOGRAPHY CHEST WITH CONTRAST  TECHNIQUE: Multidetector CT imaging of the chest was performed using the standard protocol during bolus administration of intravenous contrast. Multiplanar CT image reconstructions and MIPs were obtained to evaluate the vascular anatomy.  CONTRAST:  100 mL Omnipaque 350  COMPARISON:  08/08/2012  FINDINGS: Technically adequate study with good opacification of the central and segmental pulmonary arteries. No focal filling defects are demonstrated. No evidence of significant pulmonary embolus.  Normal heart size. Normal caliber thoracic aorta. Calcification in the coronary arteries. Esophagus is mostly decompressed. No significant lymphadenopathy in the chest. Emphysematous changes and scattered interstitial fibrosis in the lungs. Basilar dependent atelectasis. Bronchial wall thickening with peribronchial infiltration consistent with chronic bronchitis/ bronchiolitis. No focal airspace disease or consolidation in the lungs. No pneumothorax. No effusion.  Visualized portions of the upper abdominal organs are grossly unremarkable. Degenerative changes in the thoracic spine. No destructive bone lesions appreciated.  Review of the MIP images confirms the above findings.  IMPRESSION: No evidence of  significant pulmonary embolus. Emphysematous and bronchitic changes in the lungs.   Electronically Signed   By: Lucienne Capers M.D.   On: 10/21/2013 00:25   02/11/12 TTE Left ventricle: The cavity size was normal. Wall thickness was increased in a pattern of mild LVH. Systolic function was normal. The estimated ejection fraction was in the range of 55% to 60%. Wall motion was normal; there were no regional wall motion abnormalities. Left ventricular diastolic function parameters were normal.   ECG NSR, LAE, NSSTTWC  ASSESSMENT AND PLAN 5M with recurrent DVT/PE s/p IVC filter and poorly controlled asthma who p/w NSTEMI.  Risk factors include age, sex, smoking history, and apparent thrombophilia.   Would recommend: - 81mg  ASA - Atorvastatin 80mg  - IV unfractionated heparin - Reasonable to try metoprolol 6.25mg  PO BID given ongoing chest pain; will need to be gentle re: titration given          asthma history - Continue IV NTG for BP and CP control - PRN IV morphine for CP - Check hemobglobin A1c and lipid panel - Hold rivaroxaban  - smoking cessation counseling - NPO for probable cath; may need sooner than later depending on if we can get him CP free  Signed, Lamar Sprinkles, MD 10/21/2013, 3:08 AM

## 2013-10-21 NOTE — Progress Notes (Signed)
INITIAL NUTRITION ASSESSMENT  DOCUMENTATION CODES Per approved criteria  -Severe malnutrition in the context of chronic illness   INTERVENTION: Carn[employer] Instant Breakfast TID between meals.   NUTRITION DIAGNOSIS: Malnutrition related to chronic illness as evidenced by severe muscle depletion and intake of </= 75% of his needs for >/= 1 month.   Goal: Pt to meet >/= 90% of their estimated nutrition needs   Monitor:  PO intake, supplement acceptance, weight trend, labs  Reason for Assessment: Pt identified as at nutrition risk on the Malnutrition Screen Tool  53 y.o. male  Admitting Dx: <principal problem not specified>  ASSESSMENT: Pt admitted with chest pain. Pt with NSTEMI, pt had heart cath 7/28.  Pt just back from cath lab. Per documentation pt noncompliant with treatments and may have alcohol abuse.  Pt reports unintentional weight loss over the last year. Per chart review pt has lost 17% of his body weight in the last six months.  Pt reports living in a garage and does not have access to a kitchen. He states that the "girls" that live in the house do the cooking but they don't cook much. He relies on fast food and snacks that he keeps in his living space for food.  Pt does not like ensure but has drank [employer] before and likes them. Pt has not drank any oral nutrition supplements recently.   Nutrition Focused Physical Exam:  Subcutaneous Fat:  Orbital Region: WDL Upper Arm Region: mild/moderate depletion  Thoracic and Lumbar Region: WDL  Muscle:  Temple Region: severe depletion  Clavicle Bone Region: severe depletion  Clavicle and Acromion Bone Region: severe depletion  Scapular Bone Region: NA Dorsal Hand: WDL Patellar Region: WDL Anterior Thigh Region: WDL Posterior Calf Region: WDL  Edema: not present   Height: Ht Readings from Last 1 Encounters:  10/21/13 6\' 2"  (1.88 m)    Weight: Wt Readings from Last 1 Encounters:  10/21/13 166  lb 14.2 oz (75.7 kg)    Ideal Body Weight: 83.6 kg  % Ideal Body Weight: 91%  Wt Readings from Last 10 Encounters:  10/21/13 166 lb 14.2 oz (75.7 kg)  10/21/13 166 lb 14.2 oz (75.7 kg)  10/21/13 166 lb 14.2 oz (75.7 kg)  06/18/13 156 lb 15.5 oz (71.2 kg)  06/03/13 160 lb (72.576 kg)  03/25/13 170 lb (77.111 kg)  02/22/13 159 lb 9.8 oz (72.4 kg)  08/08/12 180 lb (81.647 kg)  05/28/12 185 lb 3.2 oz (84.006 kg)  04/01/12 200 lb 6.4 oz (90.901 kg)    Usual Body Weight: 200 lb   % Usual Body Weight: 83%  BMI:  Body mass index is 21.42 kg/(m^2).  Estimated Nutritional Needs: Kcal: 2000-2200 Protein: 95-115 grams Fluid: > 1.9 L/day  Skin: WDL  Diet Order: Cardiac  EDUCATION NEEDS: -No education needs identified at this time   Intake/Output Summary (Last 24 hours) at 10/21/13 1628 Last data filed at 10/21/13 1600  Gross per 24 hour  Intake 698.78 ml  Output    100 ml  Net 598.78 ml    Last BM: 7/27   Labs:   Recent Labs Lab 10/20/13 2245 10/21/13 0537  NA 141 138  K 3.9 3.7  CL 104 104  CO2 19 21  BUN 15 12  CREATININE 1.21 0.89  CALCIUM 10.1 9.0  GLUCOSE 121* 128*    CBG (last 3)  No results found for this basename: GLUCAP,  in the last 72 hours  Scheduled Meds: . [START ON  10/22/2013] aspirin  81 mg Oral Daily  . atorvastatin  80 mg Oral q1800  . lisinopril  5 mg Oral Daily  . metoprolol tartrate  12.5 mg Oral BID  . mometasone-formoterol  2 puff Inhalation BID  . [START ON 10/22/2013] prasugrel  10 mg Oral Daily  . [START ON 10/22/2013] sodium chloride  3 mL Intravenous Q12H    Continuous Infusions: . sodium chloride 10 mL/hr (10/21/13 0451)  . sodium chloride 1 mL/kg/hr (10/21/13 1330)  . heparin    . nitroGLYCERIN 40 mcg/min (10/21/13 1330)  . tirofiban 0.15 mcg/kg/min (10/21/13 1604)    Past Medical History  Diagnosis Date  . Asthma   . DVT (deep venous thrombosis)   . Pulmonary embolism     Past Surgical History  Procedure  Laterality Date  . Ivc filter      [place], [place], [place] Pager [phone] After Hours Pager

## 2013-10-21 NOTE — ED Notes (Signed)
Dr. Broadus John notified of critical troponin 0.43, states will order heparin, nitro drip and stepdown admission.

## 2013-10-21 NOTE — Brief Op Note (Signed)
   NAME:  [name]   MRN: [id number] DOB:  November 27, 1960   ADMIT DATE: 10/20/2013  Brief Cardiac Catheterization Note: Glenetta Hew MD  Indication: 1. NSTEMI 2. H/o multiple DVT-PE on lifelong full Anticoagulation  Procedures: 1. Left Heart Catheterization with Native Coroanry Angiography via Right Radial Artery access 6 Fr sheath  TIG 4.0 cath & Angled Pigtail 2. Percutaneous Coronary Intervention of 100% thrombotically occluded Proximal to mid D1 - MultiLink Vision  2.25 mm x 28 mm (2.5 mm) with PTCA distally restoring only TIMI 2 flow distally with distal D1 remaining 100% occluded  XB LAD Guide - ProWater Guidewire  Emerge 2.0 mm x 12 mm Pre-dilation: multiple inflations 8 Atm x 30 Sec  MultiLink Vision BMS 2.25 mm x 28 mm - 16 Atm x 45 Sec  Balloon used to dilate the distal vessel  IC NTG & Verapamil, but only TIMI 2 flow restored with distal D1 100% occluded in < 2 mm vessel.  Medications:  2 mg Versed IV; 75 mcg Fentanyl IV Radial Cocktail: 5 mg Verapamil, 400 mcg NTG, 2 ml 2% Lidocaine in 10 ml NS IV Heparin 4000 Units x 2 & 2000 Units -- ACT 270 Sec IV Aggrestat bolus & gtt Effient 60 mg IC NTG 400 mcg; IC Verapamil 400 mcg  Impression:  Severe single branch vessel with moderate to severe additional 3 Vessel CAD  100% D1 @ 40-50% eccentric Mid Wraparound LAD (TIMI 2 flow in LAD)  Large Ramus - proximal ~60-70%, non-flow limiting  Moderate prox & mid RCA (dominant) 40% & 50-60%  Successful revascularization of the proximal & mid D1 - Moderate Caliber 100% occluded D1 - reduced to 0% but only TIMI 2 flow in distal vessel with occluded terminal branch  Mildly reduced LVEF ~45-50% with Anterolateral Hypokinesis  Hemodynamics:   Central AoP: 110/74/94 mmHg  LVP/EDP: 106/8/17 mmHg  Recommendations:  Post Radial Cath Care  -  TR Band 15 mL air, 1220  IV Aggrestat x 12 hrs, restart IV Heparin 6 hr post TR band removal (run until tomorrow's scheduled  Xarelto  DAPT - Effient + ASA x 1 month then OK to convert to Plavix alone in setting of Xarelto  2d Echo   Would monitor @ least 2 days post cath.  Full note to follow  Leonie Man, M.D., M.S. Interventional Cardiologist   Pager # [phone] 10/21/2013   10/21/2013 12:37 PM

## 2013-10-21 NOTE — CV Procedure (Signed)
CARDIAC CATHETERIZATION AND PERCUTANEOUS CORONARY INTERVENTION REPORT  NAME:  [name]   MRN: [id number] DOB:  1961/03/08   ADMIT DATE: 10/20/2013 Procedure Date: 10/21/2013  INTERVENTIONAL CARDIOLOGIST: Leonie Man, M.D., MS PRIMARY CARE PROVIDER: [name], MD PRIMARY CARDIOLOGIST: Loralie Champagne, MD  PATIENT:  [name] is a 53 y.o. male with a history of multiple DVT/PEs on long-standing Xarelto for anticoagulation.  He presented to [facility] with signs and symptoms concerning for acute coronary syndrome and ruled in for a non-ST elevation MI. He is now referred for invasive evaluation cardiac catheterization +/- PCI.  Due to his chronic indication for full anticoagulation & questionable medical adherence, it was felt best to use a BMS stent if indicated.  PRE-OPERATIVE DIAGNOSIS:    Non-STEMI  PROCEDURES PERFORMED:    Left Heart Catheterization with Native Coronary Angiography  via Right Radial Artery   Left Ventriculography  Successful reperfusion of the proximal and mid D1 100% thrombotic occlusion but with only TIMI 2 flow distally and a occluded terminal branch - 2 25 mm x 18 mm MultiLink Vision BMS was postdilated to 2.5 mm  PROCEDURE: The patient was brought to the 2nd [place] [facility]iac Catheterization Lab in the fasting state and prepped and draped in the usual sterile fashion for Right Radial  artery access. A modified Allen's test was performed on the right wrist demonstrating excellent collateral flow for radial access.   Sterile technique was used including antiseptics, cap, gloves, gown, hand hygiene, mask and sheet. Skin prep: Chlorhexidine.   Consent: Risks of procedure as well as the alternatives and risks of each were explained to the (patient/caregiver). Consent for procedure obtained.   Time Out: Verified patient identification, verified procedure, site/side was marked, verified correct patient position, special equipment/implants  available, medications/allergies/relevent history reviewed, required imaging and test results available. Performed.  Access:   Right Radial  Artery: 6 Fr Sheath -  Seldinger Technique (Angiocath Micropuncture Kit)  Radial Cocktail - 10 mL; IV Heparin 4000 Units   Left Heart Catheterization: 5 Fr Catheters advanced or exchanged over a Long Exchange Safety J-wire; JR 4 catheter advanced first.  Left Coronary Artery Cineangiography: JL4 Catheter  Right Coronary Artery Cineangiography: JR 4 Catheter   LV Hemodynamics (LV Gram): Angled pigtail  Sheath removed in the Cath Lab with TR band placement for hemostasis.  TR Band: 1220  Hours; 50 mL air  FINDINGS:  Hemodynamics:   Central Aortic Pressure / Mean: 110/74/94 mmHg  Left Ventricular Pressure / LVEDP: 106/8/17 mmHg  Left Ventriculography:  EF: 45-50 %  Wall Motion: Anterolateral hypokinesis  Coronary Anatomy:  Dominance: Right  Left Main: Large-caliber, short vessel it trifurcates into the LAD, Ramus Intermedius, and Circumflex; angiographically normal LAD: Large-caliber vessel that has a relatively proximal moderate caliber diagonal branch appears to 100% occluded after giving off a very small branch. The takeoff of the D1 is from a roughly 40-50% acentric stenosis of the LAD. Beyond this the LAD down to wrap the apex perfusing the distal half to one quarter of the inferior apex.  Unlike the other vessels, flow in the mid to distal LAD was "sluggish" TIMI 2 flow suggesting that the LAD or daughter vessel was the culprit.  Left Circumflex: Moderate caliber vessel that courses as a lateral OM with 2 major ones smaller branch. Minimal luminal irregularities.  Ramus intermedius: Large-caliber major branch of a long eccentric 60-7 doses followed by 30% stenosis. The vessel then normalizes and bifurcates in the distal  mid vessel and both branches reach almost to the apex..    RCA: Moderate to large caliber mostly dominant vessel  with proximal and mid vessel focal 40-50% and 50-60s percent lesions. The vessel and bifurcates distally into the Right Posterior Descending Artery (RPDA) and the  Right Posterior AV Groove Branch (RPAV).    RPDA:  2 small to moderate caliber somewhat tortuous vessel it reaches about two thirds the way to the apex. Minimal luminal irregularities   RPL Sysytem:The RPAV  begins is a small moderate caliber vessel it gives off 2 small posterolateral branches. Minimal luminal irregularities.   After reviewing the initial angiography, the culprit lesion was thought to be  apparent 100% occluded diagonal branch (D1).  Preparation were made to proceed with PCI on this lesion.  If the Diagonal is not occluded as expected, IVUS of the MID LAD lesion would be warranted.  Percutaneous Coronary Intervention:  Sheath exchanged for 6 Fr  Lesion Data: 100% Proximal D1 --> reduced to 0% at occlusion & mid vessel,   TIMI 0 flow pre; TIMI 2 flow to distal vessel & occluded distal vessel post PCI.  Guide: 6 Fr   XBLAD  Guidewire:  [place]  Predilation Balloon: Emerge 2.0 mm x  12 mm;    multiple inflations at 6-8  Atm x 20-30  Sec - inflations made from near ostial stenosis the distal vessel.  After PTCA - TIMI 1 flow restored with a long proximal to mid thrombotic lesion with a possible focal dissection was noted -- this segment would be covered by the stent.  Stent: MultiLink Vision 2.25 BMS  mm x 28 mm; covers initial occlusion site & mid vessel"thrombotic dissection.  Max Inflation 16 Atm x 45 Sec,   Balloon inflated in distal vessel: 6 Atm x 30 Sec  Final Diameter 2.5 mm Post stent placement, TIMI2 flow noted with occluded distal vessel  Additional inflations of the Emerge 2.0 mm x  12 mm up to 4-6 Atm x ~30 Sec were performed in the very distal D1 in an attempt to improve distal flow.  IV Aggrestat bolus administered & infusion started  Post deployment angiography in multiple views, with and  without guidewire in place revealed excellent stent deployment and lesion coverage.  There was no evidence of dissection or perforation.  Unfortunately, despite extensive balloon inflations & Intra Coronary NTG & Verapamil injections, the distal D1 remains thromboticaly occluded (~1.5 mm vessel at this point), but collateral flow reveals reconstitution beyond the occlusion.  MEDICATIONS:  Anesthesia:  Local Lidocaine 2 ml  Sedation:  2 mg IV Versed, 75 mcg IV fentanyl ;   Omnipaque Contrast: 260 ml  Anticoagulation:  IV Heparin 4000 Units x 2 & an additional 2000 Units for ACT> 250 Sec   Anti-Platelet Agent:  Effient 60 mg PO  Radial Cocktail: 5 mg Verapamil, 400 mcg NTG, 2 ml 2% Lidocaine in 10 ml NS IC NTG 400 mcg total IC Verapamil 400 mcg total  PATIENT DISPOSITION:    The patient was transferred to the PACU holding area in a hemodynamicaly stable, chest pain free condition.  The patient tolerated the procedure well, and there were no complications.  EBL:   < 10 ml  The patient was stable before, during, and after the procedure.  POST-OPERATIVE DIAGNOSIS:    Severe single branch vessel with moderate to severe additional 3 Vessel CAD:   100% D1 @ 40-50% eccentric Mid Wraparound LAD (TIMI 2 flow in LAD)  Large Ramus -  proximal ~60-70%, non-flow limiting   Moderate prox & mid RCA (dominant) 40% & 50-60%  Borderline Successful revascularization of the proximal & mid D1 - Moderate Caliber 100% occluded D1 - reduced to 0% but only TIMI 2 flow in distal vessel with occluded terminal branch -- a 2.25 mm x 28 MultiLink Vision BMS was deployed to 2.5 mm.  Mildly reduced LVEF ~45-50% with Anterolateral Hypokinesis with moderately elevated LVEDP   PLAN OF CARE:  Post Radial Cath Care - TR Band 15 mL air, 1220   IV Aggrestat x 12 hrs, restart IV Heparin 6 hr post TR band removal (run until tomorrow's scheduled Xarelto   DAPT - Effient + ASA x 1 month then OK to convert to Plavix  alone in setting of Xarelto 2d Echo   Would monitor @ least 2 days post cath.  If recurrent angina - consider FFR vs. IVUS guided PCI of mid LAd vs. Ramus Intermedius.  Leonie Man, M.D., M.S. [facility] [address] [place], [place]  [address]  [phone]  10/21/2013 12:36 PM

## 2013-10-21 NOTE — Progress Notes (Signed)
  Echocardiogram 2D Echocardiogram has been performed.  Mauricio Po 10/21/2013, 3:37 PM

## 2013-10-21 NOTE — Progress Notes (Signed)
Patient ID: [name], male   DOB: October 01, 1960, 53 y.o.   MRN: [id number]   SUBJECTIVE: Patient still having chest pain, troponin up to 2.24.    Scheduled Meds: . aspirin  325 mg Oral Daily  . atorvastatin  80 mg Oral q1800  . lisinopril  5 mg Oral Daily  . metoprolol tartrate  12.5 mg Oral BID  . mometasone-formoterol  2 puff Inhalation BID   Continuous Infusions: . sodium chloride 10 mL/hr (10/21/13 0451)  . heparin 900 Units/hr (10/21/13 0539)  . nitroGLYCERIN 60 mcg/min (10/21/13 0900)   PRN Meds:.acetaminophen, acetaminophen, albuterol, morphine injection, nitroGLYCERIN, ondansetron (ZOFRAN) IV    Filed Vitals:   10/21/13 0615 10/21/13 0630 10/21/13 0700 10/21/13 0800  BP: 136/95 123/101 134/95 130/89  Pulse: 86 74 68 64  Temp:   97.4 F (36.3 C)   TempSrc:   Oral   Resp: 19 18 9 14   Height:      Weight:      SpO2: 99% 100% 100% 100%    Intake/Output Summary (Last 24 hours) at 10/21/13 0929 Last data filed at 10/21/13 0800  Gross per 24 hour  Intake 109.58 ml  Output      0 ml  Net 109.58 ml    LABS: Basic Metabolic Panel:  Recent Labs  10/20/13 2245 10/21/13 0537  NA 141 138  K 3.9 3.7  CL 104 104  CO2 19 21  GLUCOSE 121* 128*  BUN 15 12  CREATININE 1.21 0.89  CALCIUM 10.1 9.0   Liver Function Tests:  Recent Labs  10/20/13 2245 10/21/13 0537  AST 39* 41*  ALT 26 22  ALKPHOS 93 80  BILITOT 0.5 0.4  PROT 8.0 6.8  ALBUMIN 3.9 3.2*    Recent Labs  10/21/13 0200  LIPASE 15   CBC:  Recent Labs  10/20/13 2245 10/21/13 0537  WBC 5.1 4.9  NEUTROABS 2.7  --   HGB 14.7 12.2*  HCT 43.5 36.7*  MCV 95.6 95.1  PLT 158 146*   Cardiac Enzymes:  Recent Labs  10/21/13 0200 10/21/13 0537  TROPONINI 0.43* 2.24*   BNP: No components found with this basename: POCBNP,  D-Dimer: No results found for this basename: DDIMER,  in the last 72 hours Hemoglobin A1C: No results found for this basename: HGBA1C,  in the last 72  hours Fasting Lipid Panel: No results found for this basename: CHOL, HDL, LDLCALC, TRIG, CHOLHDL, LDLDIRECT,  in the last 72 hours Thyroid Function Tests: No results found for this basename: TSH, T4TOTAL, FREET3, T3FREE, THYROIDAB,  in the last 72 hours Anemia Panel: No results found for this basename: VITAMINB12, FOLATE, FERRITIN, TIBC, IRON, RETICCTPCT,  in the last 72 hours  RADIOLOGY: Dg Chest 2 View  10/20/2013   CLINICAL DATA:  Centralized chest pain, cough, congestion, and sneezing. Thirty year smoker.  EXAM: CHEST  2 VIEW  COMPARISON:  06/18/2013  FINDINGS: Normal heart size and pulmonary vascularity. Mild hyperinflation and central interstitial changes suggesting emphysema and chronic bronchitis. No focal airspace disease or consolidation in the lungs. No blunting of costophrenic angles. No pneumothorax. Degenerative changes in the thoracic spine.  IMPRESSION: Emphysematous changes and chronic bronchitic changes in the lungs. No evidence of active pulmonary disease.   Electronically Signed   By: Lucienne Capers M.D.   On: 10/20/2013 23:35   Ct Angio Chest Pe W/cm &/or Wo Cm  10/21/2013   CLINICAL DATA:  Sudden onset midsternal chest pain and shortness of breath. History of DVT.  EXAM: CT ANGIOGRAPHY CHEST WITH CONTRAST  TECHNIQUE: Multidetector CT imaging of the chest was performed using the standard protocol during bolus administration of intravenous contrast. Multiplanar CT image reconstructions and MIPs were obtained to evaluate the vascular anatomy.  CONTRAST:  100 mL Omnipaque 350  COMPARISON:  08/08/2012  FINDINGS: Technically adequate study with good opacification of the central and segmental pulmonary arteries. No focal filling defects are demonstrated. No evidence of significant pulmonary embolus.  Normal heart size. Normal caliber thoracic aorta. Calcification in the coronary arteries. Esophagus is mostly decompressed. No significant lymphadenopathy in the chest. Emphysematous  changes and scattered interstitial fibrosis in the lungs. Basilar dependent atelectasis. Bronchial wall thickening with peribronchial infiltration consistent with chronic bronchitis/ bronchiolitis. No focal airspace disease or consolidation in the lungs. No pneumothorax. No effusion.  Visualized portions of the upper abdominal organs are grossly unremarkable. Degenerative changes in the thoracic spine. No destructive bone lesions appreciated.  Review of the MIP images confirms the above findings.  IMPRESSION: No evidence of significant pulmonary embolus. Emphysematous and bronchitic changes in the lungs.   Electronically Signed   By: Lucienne Capers M.D.   On: 10/21/2013 00:25   US Abdomen Complete  10/21/2013   CLINICAL DATA:  Abdominal pain, nausea and vomiting.  EXAM: ULTRASOUND ABDOMEN COMPLETE  COMPARISON:  None.  FINDINGS: Gallbladder:  No gallstones or wall thickening visualized. No sonographic Murphy sign noted.  Common bile duct:  Diameter: 3.8 mm, normal  Liver:  No focal lesion identified. Within normal limits in parenchymal echogenicity.  IVC:  No abnormality visualized.  Pancreas:  Visualized portion unremarkable.  Spleen:  Size and appearance within normal limits.  Right Kidney:  Length: 11.1 cm. Echogenicity within normal limits. No mass or hydronephrosis visualized.  Left Kidney:  Length: 12.2 cm. Echogenicity within normal limits. No mass or hydronephrosis visualized.  Abdominal aorta:  Limited visualization.  Overlying bowel gas.  Other findings:  None.  IMPRESSION: No acute abnormalities demonstrated.   Electronically Signed   By: Lucienne Capers M.D.   On: 10/21/2013 03:20    PHYSICAL EXAM General: NAD Neck: No JVD, no thyromegaly or thyroid nodule.  Lungs: Clear to auscultation bilaterally with normal respiratory effort. CV: Nondisplaced PMI.  Heart regular S1/S2, no S3/S4, no murmur.  No peripheral edema.  Abdomen: Soft, nontender, no hepatosplenomegaly, no distention.   Neurologic: Alert and oriented x 3.  Psych: Normal affect. Extremities: No clubbing or cyanosis.   TELEMETRY: Reviewed telemetry pt in NSR  ASSESSMENT AND PLAN: 53 yo smoker with history of multiple DVTs and PEs presented with chest pain and elevated troponin.   1. Chest pain: TnI 2.24, CTA chest negative for PE and he has an IVC filter.  He did not have chest pain with prior PE.  I am concerned for NSTEMI.  - LHC today (last took Xarelto on Sunday).  - Continue heparin gtt, NTG gtt, metoprolol, ASA, statin.  - Echocardiogram 2. H/o PE/DVT: Has IVC filter but not totally compliant with Xarelto (last dose on Sunday).  If needs PCI, will need to be aware that he will need anticoagulation long-term as well.   Loralie Champagne 10/21/2013 9:33 AM

## 2013-10-21 NOTE — Care Management Note (Addendum)
    Page 1 of 1   10/27/2013     5:05:01 PM CARE MANAGEMENT NOTE 10/27/2013  Patient:  [name],[name]   Account Number:  [id number]  Date Initiated:  10/21/2013  Documentation initiated by:  Elissa Hefty  Subjective/Objective Assessment:   adm w ch pain, pos troponin     Action/Plan:   lives w fam, pcp dr Orpah Greek williams   Anticipated DC Date:  10/27/2013   Anticipated DC Plan:  [place]  CM consult  Medication Assistance      Choice offered to / List presented to:             Status of service:  Completed, signed off Medicare Important Message given?  YES (If response is "NO", the following Medicare IM given date fields will be blank) Date Medicare IM given:  10/23/2013 Medicare IM given by:  [facility] Date Additional Medicare IM given:  10/27/2013 Additional Medicare IM given by:  Delailah Spieth  Discharge Disposition:  HOME/SELF CARE  Per UR Regulation:  Reviewed for med. necessity/level of care/duration of stay  If discussed at [place] of Stay Meetings, dates discussed:    Comments:  10/27/13 Ellan Lambert, RN, BSN [phone] Pt given free trial cards for Effient and Eliquis.  10-23-13 11:50am Luz Lex, [place] [phone] Lives with friend.  Independent - plan for discharge back to same level.  10/22/13 [place] MSN BSN CCM Eliquis is on medicaid preferred list - pt will have $3.00 copay.  Provided card for free 30-day trial.  7/28 1359 debbie dowell rn,bsn gave pt effeint 30day free card. pt has medicaid and effient on preferred list so has 3.00 copay .

## 2013-10-21 NOTE — Progress Notes (Signed)
[facility]  [name] EZM:[id number] DOB: 02/05/1961 DOA: 10/20/2013 PCP: [name], MD   Brief narrative: 53 y.o. BM PMHx DVT/PE on Xarelto, asthma, tobacco abuse, alcohol use who  presented to the ER with chest pressure. 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 This was associated with sweating and some nausea. EMS was called and he was brought to the ER. En route he was given 3 sublingual nitroglycerin with only minimal benefit.   In the ER EKG, troponin x1 unremarkable. Underwent a CTA chest which was negative for PE. He was still experiencing CP and due to multiple risk factors for CAD he was admitted  HPI/Subjective: Continues with intermittEnt substernal CP not relieved by IV morphine  Assessment/Plan: Active Problems:   CAD/NSTEMI (non-ST elevated myocardial infarction) -continued with CP this am despite morphine and IV NTG -Cards consulted over night and re-evaluated this am -subsequently taken to the cath lab 7/28: radial approach-severe 3V CAD s/p PTCA proximal and mid D1 -started on Effient and ASA x 1 month then OK to convert to Plavix alone in setting of preadmit Xarelto per Cards -recommend post cath obs x 48 hrs -cont Beta blocker-ACE added today   Mild systolic dysfunction -EF 50% on cath -ACE added this admit   Uncontrolled HTN -as above    ETOH abuse -monitor closely-may need CIWA    COPD  -compensated    Personal history of pulmonary embolism (2014)/  History of DVT -? Dc Xarelto soon     Tobacco abuse -Patient counseled on need to absolutely stop smoking now. -Prior to discharge will discuss treatment options Wellbutrin vs nicotine patch     DVT prophylaxis: IV heparin Code Status: Full Family Communication: Patient only no family at bedside Disposition Plan/Expected LOS:  Stepdown   Consultants: Cardiology  Procedures: Left heart catheterization  7/28  Cultures: None  Antibiotics: None  Objective: Blood pressure 131/70, pulse 101, temperature 97.4 F (36.3 C), temperature source Oral, resp. rate 29, height 6\' 2"  (1.88 m), weight 166 lb 14.2 oz (75.7 kg), SpO2 99.00%.  Intake/Output Summary (Last 24 hours) at 10/21/13 1316 Last data filed at 10/21/13 1000  Gross per 24 hour  Intake 230.28 ml  Output    100 ml  Net 130.28 ml     Exam: Patient admitted at 2:17 AM today; followup exam completed  Scheduled Meds:  Scheduled Meds: . aspirin  325 mg Oral Daily  . atorvastatin  80 mg Oral q1800  . lisinopril  5 mg Oral Daily  . metoprolol tartrate  12.5 mg Oral BID  . mometasone-formoterol  2 puff Inhalation BID  . sodium chloride  3 mL Intravenous Q12H   Continuous Infusions: . sodium chloride 10 mL/hr (10/21/13 0451)  . [START ON 10/22/2013] sodium chloride 1 mL/kg/hr (10/21/13 0944)  . heparin 900 Units/hr (10/21/13 0539)  . nitroGLYCERIN 60 mcg/min (10/21/13 0900)    Data Reviewed: Basic Metabolic Panel:  Recent Labs Lab 10/20/13 2245 10/21/13 0537  NA 141 138  K 3.9 3.7  CL 104 104  CO2 19 21  GLUCOSE 121* 128*  BUN 15 12  CREATININE 1.21 0.89  CALCIUM 10.1 9.0   Liver Function Tests:  Recent Labs Lab 10/20/13 2245 10/21/13 0537  AST 39* 41*  ALT 26 22  ALKPHOS 93 80  BILITOT 0.5 0.4  PROT 8.0 6.8  ALBUMIN  3.9 3.2*    Recent Labs Lab 10/21/13 0200  LIPASE 15   No results found for this basename: AMMONIA,  in the last 168 hours CBC:  Recent Labs Lab 10/20/13 2245 10/21/13 0537  WBC 5.1 4.9  NEUTROABS 2.7  --   HGB 14.7 12.2*  HCT 43.5 36.7*  MCV 95.6 95.1  PLT 158 146*   Cardiac Enzymes:  Recent Labs Lab 10/21/13 0200 10/21/13 0537 10/21/13 0956  TROPONINI 0.43* 2.24* 4.06*   BNP (last 3 results) No results found for this basename: PROBNP,  in the last 8760 hours CBG: No results  found for this basename: GLUCAP,  in the last 168 hours  Recent Results (from the past 240 hour(s))  MRSA PCR SCREENING     Status: None   Collection Time    10/21/13  4:43 AM      Result Value Ref Range Status   MRSA by PCR NEGATIVE  NEGATIVE Final   Comment:            The GeneXpert MRSA Assay (FDA     approved for NASAL specimens     only), is one component of a     comprehensive MRSA colonization     surveillance program. It is not     intended to diagnose MRSA     infection nor to guide or     monitor treatment for     MRSA infections.     Studies:  Recent x-ray studies have been reviewed in detail by the Attending Physician  Time spent :      Erin Hearing, [place] [facility] Office  [phone] Pager [phone]   **If unable to reach the above provider after paging please contact the [place] @ [phone]  On-Call/Text Page:      [email or web address]      password [email or web address]  If 7PM-7AM, please contact night-coverage [email or web address] Password [email or web address] 10/21/2013, 1:16 PM   LOS: 1 day    Examined patient and discussed assessment and plan with ANP Ebony Hail Patient with multiple complex medical problems> 40 minutes used in direct patient care

## 2013-10-21 NOTE — ED Notes (Signed)
Cardiology consult at bedside.

## 2013-10-21 NOTE — Progress Notes (Signed)
Back from the cath, not following instructions, so manipulative that explanations given to avoid bleeding. from th right arm.

## 2013-10-21 NOTE — H&P (Addendum)
[facility] History and Physical  Adison Jerger PYK:[id number] DOB: [birth year] DOA: 10/20/2013  Referring physician: EDP PCP: [name], MD   Chief Complaint: chest pressure  HPI: [name] is a 53 y.o. male history of DVT/PE on Xarelto, asthma, tobacco abuse, alcohol use presents to the ER with the above complaints. 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 Was given 3 sublingual nitroglycerin with only minimal benefit. In the ER EKG, troponin x1 unremarkable. Underwent a CTA chest which was negative for PE. At this time also complains of epigastric pain which is relatively more severe than his substernal chest pain/pressure, he denies any heartburn-like symptoms. Denies any hematemesis or melena. Currently he smokes 5-6 cigarettes a day, drinks about one to 2 beers daily and denies any cocaine or illicit drug use.     Review of Systems: positives bolded Constitutional:  No weight loss, night sweats, Fevers, chills, fatigue.  HEENT:  No headaches, Difficulty swallowing,Tooth/dental problems,Sore throat,  No sneezing, itching, ear ache, nasal congestion, post nasal drip,  Cardio-vascular:  chest pain, Orthopnea, PND, swelling in lower extremities, anasarca, dizziness, palpitations  GI:  No heartburn, indigestion, abdominal pain, nausea, vomiting, diarrhea, change in bowel habits, loss of appetite  Resp:   shortness of breath with exertion or at rest. No excess mucus, no productive cough, No non-productive cough, No coughing up of blood.No change in color of mucus.No wheezing.No chest wall deformity  Skin:  no rash or lesions.  GU:  no dysuria, change in color of urine, no urgency or frequency. No flank pain.  Musculoskeletal:  No joint pain or swelling. No  decreased range of motion. No back pain.  Psych:  No change in mood or affect. No depression or anxiety. No memory loss.   Past Medical History  Diagnosis Date  . Asthma   . DVT (deep venous thrombosis)   . Pulmonary embolism    Past Surgical History  Procedure Laterality Date  . Ivc filter     Social History:  reports that he has been smoking Cigarettes.  He has a 6 pack-year smoking history. He has never used smokeless tobacco. He reports that he drinks alcohol. He reports that he does not use illicit drugs.  No Known Allergies  Family History  Problem Relation Age of Onset  . Asthma Mother   . Allergies Mother   . Allergies Sister   . Allergies Brother      Prior to Admission medications   Medication Sig Start Date End Date Taking? Authorizing Provider  Fluticasone-Salmeterol (ADVAIR DISKUS) 250-50 MCG/DOSE AEPB Inhale 1 puff into the lungs 2 (two) times daily. 06/20/13  Yes Nishant Dhungel, MD  Rivaroxaban (XARELTO) 20 MG TABS tablet Take 1 tablet (20 mg total) by mouth daily with supper. 06/20/13  Yes Nishant Dhungel, MD  albuterol (PROVENTIL HFA;VENTOLIN HFA) 108 (90 BASE) MCG/ACT inhaler Inhale 2 puffs into the lungs every 6 (six) hours as needed for wheezing or shortness of breath. 03/28/13   Orson Eva, MD   Physical Exam: Filed Vitals:   10/21/13 0030 10/21/13 0100 10/21/13 0130 10/21/13 0200  BP: 172/102 172/107 170/105 171/101  Pulse: 82 80 79 77  Temp:      Resp: 14 13 17 15   Height:      Weight:      SpO2: 97% 98% 99%  99%    Wt Readings from Last 3 Encounters:  10/20/13 72.576 kg (160 lb)  06/18/13 71.2 kg (156 lb 15.5 oz)  06/03/13 72.576 kg (160 lb)    General:  Appears calm and comfortable, thinly built, no distress Eyes: PERRL, normal lids, irises & conjunctiva ENT: grossly normal hearing, lips & tongue Neck: no LAD, masses or thyromegaly Cardiovascular: RRR, no m/r/g. No LE edema. Respiratory: CTA bilaterally, poor air movement no w/r/r. Normal  respiratory effort. Abdomen: soft, mild epigastric tenderness, no rigidity or rebound, BS present Skin: no rash or induration seen on limited exam Musculoskeletal: grossly normal tone BUE/BLE Psychiatric: grossly normal mood and affect, speech fluent and appropriate Neurologic: grossly non-focal.          Labs on Admission:  Basic Metabolic Panel:  Recent Labs Lab 10/20/13 2245  NA 141  K 3.9  CL 104  CO2 19  GLUCOSE 121*  BUN 15  CREATININE 1.21  CALCIUM 10.1   Liver Function Tests:  Recent Labs Lab 10/20/13 2245  AST 39*  ALT 26  ALKPHOS 93  BILITOT 0.5  PROT 8.0  ALBUMIN 3.9   No results found for this basename: LIPASE, AMYLASE,  in the last 168 hours No results found for this basename: AMMONIA,  in the last 168 hours CBC:  Recent Labs Lab 10/20/13 2245  WBC 5.1  NEUTROABS 2.7  HGB 14.7  HCT 43.5  MCV 95.6  PLT 158   Cardiac Enzymes: No results found for this basename: CKTOTAL, CKMB, CKMBINDEX, TROPONINI,  in the last 168 hours  BNP (last 3 results) No results found for this basename: PROBNP,  in the last 8760 hours CBG: No results found for this basename: GLUCAP,  in the last 168 hours  Radiological Exams on Admission: Dg Chest 2 View  10/20/2013   CLINICAL DATA:  Centralized chest pain, cough, congestion, and sneezing. Thirty year smoker.  EXAM: CHEST  2 VIEW  COMPARISON:  06/18/2013  FINDINGS: Normal heart size and pulmonary vascularity. Mild hyperinflation and central interstitial changes suggesting emphysema and chronic bronchitis. No focal airspace disease or consolidation in the lungs. No blunting of costophrenic angles. No pneumothorax. Degenerative changes in the thoracic spine.  IMPRESSION: Emphysematous changes and chronic bronchitic changes in the lungs. No evidence of active pulmonary disease.   Electronically Signed   By: Lucienne Capers M.D.   On: 10/20/2013 23:35   Ct Angio Chest Pe W/cm &/or Wo Cm  10/21/2013   CLINICAL DATA:  Sudden  onset midsternal chest pain and shortness of breath. History of DVT.  EXAM: CT ANGIOGRAPHY CHEST WITH CONTRAST  TECHNIQUE: Multidetector CT imaging of the chest was performed using the standard protocol during bolus administration of intravenous contrast. Multiplanar CT image reconstructions and MIPs were obtained to evaluate the vascular anatomy.  CONTRAST:  100 mL Omnipaque 350  COMPARISON:  08/08/2012  FINDINGS: Technically adequate study with good opacification of the central and segmental pulmonary arteries. No focal filling defects are demonstrated. No evidence of significant pulmonary embolus.  Normal heart size. Normal caliber thoracic aorta. Calcification in the coronary arteries. Esophagus is mostly decompressed. No significant lymphadenopathy in the chest. Emphysematous changes and scattered interstitial fibrosis in the lungs. Basilar dependent atelectasis. Bronchial wall thickening with peribronchial infiltration consistent with chronic bronchitis/ bronchiolitis. No focal airspace disease or consolidation in the lungs. No pneumothorax. No effusion.  Visualized portions of the upper abdominal organs are grossly unremarkable. Degenerative changes in the thoracic spine. No destructive bone lesions appreciated.  Review of the MIP images confirms the above findings.  IMPRESSION: No evidence of significant pulmonary embolus. Emphysematous and bronchitic changes in the lungs.   Electronically Signed   By: Lucienne Capers M.D.   On: 10/21/2013 00:25    EKG: Independently reviewed. NSR, poor  R wave progression, no acute ST T wave changes  Assessment/Plan Chest/EPigastric pain -started as substernal chest pressure, now with epigastric pain too EKG,  troponin x1 unremarkable, initial symptoms concerning for ACS -CTA negative for PE -will cycle cardiac enzymes -check ECHO -given abd pain, will check lipase, check Abd [place] to r/o gall bladder disease    Tobacco abuse -counseled    H/o DVT (deep  venous thrombosis) -continue xarelto    Asthma/COPD -stable, continue symbicort, albuterol PRN    ETOH abuse -counseled, thiamine  Code Status: Full DVT Prophylaxis: on xarelto Family Communication: none at bedside Disposition Plan: pending workup  Time spent: 40min  Eisley Barber [facility] Pager [phone]  **Disclaimer: This note may have been dictated with voice recognition software. Similar sounding words can inadvertently be transcribed and this note may contain transcription errors which may not have been corrected upon publication of note.**

## 2013-10-21 NOTE — Progress Notes (Signed)
TR band removed, gauze 2x2 and tegaderm applied, no bleeding noted. Continue to monitor.

## 2013-10-21 NOTE — Interval H&P Note (Signed)
History and Physical Interval Note:  10/21/2013 10:49 AM  [name]  has presented today for surgery, with the diagnosis of NSTEMI  The various methods of treatment have been discussed with the patient and family. After consideration of risks, benefits and other options for treatment, the patient has consented to  Procedure(s): LEFT HEART CATHETERIZATION WITH CORONARY ANGIOGRAM (N/A) as a surgical intervention .  The patient's history has been reviewed, patient examined, no change in status, stable for surgery.  I have reviewed the patient's chart and labs.  Questions were answered to the patient's satisfaction.     Eiley Mcginnity W  Cath Lab Visit (complete for each Cath Lab visit)  Clinical Evaluation Leading to the Procedure:   ACS: Yes.    Non-ACS:    Anginal Classification: CCS III  Anti-ischemic medical therapy: Maximal Therapy (2 or more classes of medications)  Non-Invasive Test Results: No non-invasive testing performed  Prior CABG: No previous CABG

## 2013-10-21 NOTE — Progress Notes (Signed)
ANTICOAGULATION CONSULT NOTE - Initial Consult  Pharmacy Consult for Heparin Indication: chest pain/ACS  No Known Allergies  Patient Measurements: Height: 6\' 2"  (188 cm) Weight: 160 lb (72.576 kg) IBW/kg (Calculated) : 82.2  Vital Signs: Temp: 97.9 F (36.6 C) (07/27 2235) BP: 152/98 mmHg (07/28 0330) Pulse Rate: 70 (07/28 0330)  Labs:  Recent Labs  10/20/13 2245 10/21/13 0200  HGB 14.7  --   HCT 43.5  --   PLT 158  --   CREATININE 1.21  --   TROPONINI  --  0.43*    Estimated Creatinine Clearance: 72.5 ml/min (by C-G formula based on Cr of 1.21).   Medical History: Past Medical History  Diagnosis Date  . Asthma   . DVT (deep venous thrombosis)   . Pulmonary embolism     Medications:  Albuterol  Advair  Xarelto  Assessment: 53 yo male with chest pain, Xarelto on hold, for heparin.  Last dose of Xarelto 7/26 ~ 4 pm  Goal of Therapy:  APTT 66-102 Heparin level 0.3-0.7 units/ml Monitor platelets by anticoagulation protocol: Yes   Plan:  Obtain baseline Heparin level and aPTT to assess Xarelto influence on heparin level, then start  heparin 2000 units IV bolus, then 900 units/hr  Check heparin level/aPTT in 6 hours.   Caryl Pina 10/21/2013,4:03 AM

## 2013-10-21 NOTE — Progress Notes (Signed)
Refused right groin prep. Claimed if they do not do the arm then nothing. Md aware.

## 2013-10-22 DIAGNOSIS — I200 Unstable angina: Secondary | ICD-10-CM

## 2013-10-22 LAB — BASIC METABOLIC PANEL
Anion gap: 13 (ref 5–15)
BUN: 11 mg/dL (ref 6–23)
CALCIUM: 8.6 mg/dL (ref 8.4–10.5)
CO2: 23 mEq/L (ref 19–32)
CREATININE: 0.69 mg/dL (ref 0.50–1.35)
Chloride: 104 mEq/L (ref 96–112)
GFR calc non Af Amer: >90 mL/min (ref >90)
Glucose, Bld: 112 mg/dL — ABNORMAL HIGH (ref 70–99)
Potassium: 4.3 mEq/L (ref 3.7–5.3)
Sodium: 140 mEq/L (ref 137–147)

## 2013-10-22 LAB — CBC
HCT: 33.1 % — ABNORMAL LOW (ref 39.0–52.0)
HEMOGLOBIN: 10.9 g/dL — AB (ref 13.0–17.0)
MCH: 31.5 pg (ref 26.0–34.0)
MCHC: 32.9 g/dL (ref 30.0–36.0)
MCV: 95.7 fL (ref 78.0–100.0)
Platelets: 135 10*3/uL — ABNORMAL LOW (ref 150–400)
RBC: 3.46 MIL/uL — ABNORMAL LOW (ref 4.22–5.81)
RDW: 14.3 % (ref 11.5–15.5)
WBC: 4.5 10*3/uL (ref 4.0–10.5)

## 2013-10-22 LAB — HEPARIN LEVEL (UNFRACTIONATED)
HEPARIN UNFRACTIONATED: 0.55 [IU]/mL (ref 0.30–0.70)
Heparin Unfractionated: 0.59 IU/mL (ref 0.30–0.70)

## 2013-10-22 LAB — LIPASE, BLOOD: Lipase: 20 U/L (ref 11–59)

## 2013-10-22 LAB — TROPONIN I: TROPONIN I: 4.23 ng/mL — AB (ref <0.30)

## 2013-10-22 MED ORDER — METOPROLOL TARTRATE 25 MG PO TABS
25.0000 mg | ORAL_TABLET | Freq: Two times a day (BID) | ORAL | Status: DC
  Administered 2013-10-22 – 2013-10-25 (×6): 25 mg via ORAL
  Filled 2013-10-22 (×8): qty 1

## 2013-10-22 MED ORDER — ISOSORBIDE MONONITRATE ER 30 MG PO TB24
30.0000 mg | ORAL_TABLET | Freq: Every day | ORAL | Status: DC
  Administered 2013-10-22: 30 mg via ORAL
  Filled 2013-10-22 (×2): qty 1

## 2013-10-22 MED ORDER — PANTOPRAZOLE SODIUM 40 MG PO TBEC
40.0000 mg | DELAYED_RELEASE_TABLET | Freq: Every day | ORAL | Status: DC
Start: 2013-10-22 — End: 2013-10-23
  Administered 2013-10-22 – 2013-10-23 (×2): 40 mg via ORAL
  Filled 2013-10-22 (×2): qty 1

## 2013-10-22 MED ORDER — METOPROLOL TARTRATE 12.5 MG HALF TABLET
12.5000 mg | ORAL_TABLET | Freq: Once | ORAL | Status: AC
  Administered 2013-10-22: 12.5 mg via ORAL
  Filled 2013-10-22: qty 1

## 2013-10-22 MED FILL — Sodium Chloride IV Soln 0.9%: INTRAVENOUS | Qty: 50 | Status: AC

## 2013-10-22 NOTE — Progress Notes (Signed)
ANTICOAGULATION CONSULT NOTE  Pharmacy Consult for Heparin Indication: CAD/hx DVT  No Known Allergies  Patient Measurements: Height: 6\' 2"  (188 cm) Weight: 166 lb 14.2 oz (75.7 kg) IBW/kg (Calculated) : 82.2  Vital Signs: Temp: 98.4 F (36.9 C) (07/29 1600) Temp src: Oral (07/29 1600) BP: 134/79 mmHg (07/29 1600) Pulse Rate: 69 (07/29 1500)  Labs:  Recent Labs  10/20/13 2245  10/21/13 0537 10/21/13 0956 10/21/13 1545 10/21/13 1900 10/22/13 0900 10/22/13 1000 10/22/13 1250 10/22/13 1838  HGB 14.7  --  12.2*  --   --   --  10.9*  --   --   --   HCT 43.5  --  36.7*  --   --   --  33.1*  --   --   --   PLT 158  --  146*  --   --  277 135*  --   --   --   APTT  --   --  25  --   --   --   --   --   --   --   HEPARINUNFRC  --   --  <0.10*  --   --   --   --  0.59  --  0.55  CREATININE 1.21  --  0.89  --   --   --  0.69  --   --   --   TROPONINI  --   < > 2.24* 4.06* 7.06*  --   --   --  4.23*  --   < > = values in this interval not displayed.  Estimated Creatinine Clearance: 114.3 ml/min (by C-G formula based on Cr of 0.69).   Medical History: Past Medical History  Diagnosis Date  . Asthma   . DVT (deep venous thrombosis)   . Pulmonary embolism     Medications:  Albuterol  Advair  Xarelto  Assessment: 53 yo male admitted with chest pain, Xarelto placed on hold. Patient is now post cath with BMS and started on effient. Heparin was restarted post cath and will continue for now d/t ongoing chest pain. If pain resolves tomorrow current plan is to continue effient d/t high clot burden and change anticoagulation to low dose apixaban.  HL confirmed therapeutic on 1100 units/hr. Hgb trending down but no bleeding has been noted.  Goal of Therapy:  Heparin level 0.3-0.7 units/ml Monitor platelets by anticoagulation protocol: Yes   Plan:  Continue heparin at 1100/hr Follow up orders for apixaban tomorrow  Thank you for allowing pharmacy to be a part of this patients  care team.  Rowe Robert Pharm.D., BCPS, AQ-Cardiology Clinical Pharmacist 10/22/2013 8:24 PM Pager: [phone] Phone: [phone]

## 2013-10-22 NOTE — Progress Notes (Signed)
ANTICOAGULATION CONSULT NOTE  Pharmacy Consult for Heparin Indication: CAD/hx DVT  No Known Allergies  Patient Measurements: Height: 6\' 2"  (188 cm) Weight: 166 lb 14.2 oz (75.7 kg) IBW/kg (Calculated) : 82.2  Vital Signs: Temp: 97.4 F (36.3 C) (07/29 1200) Temp src: Oral (07/29 1200) BP: 148/86 mmHg (07/29 1100) Pulse Rate: 71 (07/29 1100)  Labs:  Recent Labs  10/20/13 2245  10/21/13 0537 10/21/13 0956 10/21/13 1545 10/21/13 1900 10/22/13 0900 10/22/13 1000  HGB 14.7  --  12.2*  --   --   --  10.9*  --   HCT 43.5  --  36.7*  --   --   --  33.1*  --   PLT 158  --  146*  --   --  277 135*  --   APTT  --   --  25  --   --   --   --   --   HEPARINUNFRC  --   --  <0.10*  --   --   --   --  0.59  CREATININE 1.21  --  0.89  --   --   --  0.69  --   TROPONINI  --   < > 2.24* 4.06* 7.06*  --   --   --   < > = values in this interval not displayed.  Estimated Creatinine Clearance: 114.3 ml/min (by C-G formula based on Cr of 0.69).   Medical History: Past Medical History  Diagnosis Date  . Asthma   . DVT (deep venous thrombosis)   . Pulmonary embolism     Medications:  Albuterol  Advair  Xarelto  Assessment: 53 yo male admitted with chest pain, Xarelto placed on hold. Patient is now post cath with BMS and started on effient. Heparin was restarted post cath and will continue for now d/t ongoing chest pain. If pain resolves tomorrow current plan is to continue effient d/t high clot burden and change anticoagulation to low dose apixaban.  HL this am was therapeutic at 0.59 on 1100 units/hr, will check confirmatory level this afternoon. Hgb trending down but no bleeding has been noted.  Goal of Therapy:  Heparin level 0.3-0.7 units/ml Monitor platelets by anticoagulation protocol: Yes   Plan:  Continue heparin at 1100/hr Confirmatory heparin level this afternoon Follow up orders for apixaban tomorrow  Erin Hearing PharmD., BCPS Clinical Pharmacist Pager  [phone] 10/22/2013 1:11 PM

## 2013-10-22 NOTE — Progress Notes (Signed)
[facility]  [name] OMB:[id number] DOB: [birth year] DOA: 10/20/2013 PCP: [name], MD   Brief narrative: 53 y.o. M PMHx recurrent DVT/PE on Xarelto, asthma, tobacco abuse, alcohol use who  presented to the ER with chest pressure. 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 This was associated with sweating and some nausea. EMS was called and he was brought to the ER. En route he was given 3 sublingual nitroglycerin with only minimal benefit.   In the ER EKG, troponin x1 unremarkable. Underwent a CTA chest which was negative for PE. He was still experiencing CP and due to multiple risk factors for CAD he was admitted  HPI/Subjective: Now with epigastric pain after eating- no CP similar to presentation now that post intervention- denied nausea with the epigastric pain  Assessment/Plan:    CAD/NSTEMI (non-ST elevated myocardial infarction)-post PCI/BMS -Cards consulting -subsequently taken to the cath lab 7/28: radial approach-severe 3V CAD s/p PTCA proximal and mid D1 but Cards also documents post intervention with sluggish flow -started on Effient and ASA x 1 month then OK to convert to Plavix alone in setting of preadmit Xarelto per Cards -per CM: given 30 day free card for the Effient and since on preferred list for Medicaid co pay thereafter will be $3 -Cards transitioning IV NTG to Imdur -cont Beta blocker and statin-ACE added 4/53   Mild systolic dysfunction -EF 64% on cath -ACE added this admit   Uncontrolled HTN -as above   Epigastric pain -seems GI and not cardiac -begin PPI -Lipase normal -check serum H Pylori  ? ETOH abuse - NOT ACCURATE -actually drinks sporadically and primarily <3 beers while watching sports therefore DOES NOT fit criteria for abuse (averages 3-5 drinks per WEEK at max)    COPD   -compensated    Personal history of pulmonary embolism (2014)/  History of DVT -recurrent DVT documented > lifelong Xarelto    Tobacco abuse -Patient counseled on need to absolutely stop smoking now. -Prior to discharge plan to discuss treatment options (Wellbutrin vs nicotine patch)   DVT prophylaxis: IV heparin Code Status: Full Family Communication: Patient only no family at bedside Disposition Plan/Expected LOS: Stepdown  Consultants: Cardiology  Procedures: Left heart catheterization  7/28  Cultures: None  Antibiotics: None  Objective: Blood pressure 148/86, pulse 71, temperature 97.4 F (36.3 C), temperature source Oral, resp. rate 15, height 6\' 2"  (1.88 m), weight 166 lb 14.2 oz (75.7 kg), SpO2 98.00%.  Intake/Output Summary (Last 24 hours) at 10/22/13 1343 Last data filed at 10/22/13 1100  Gross per 24 hour  Intake 1546.6 ml  Output    826 ml  Net  720.6 ml   Exam: Gen: No acute respiratory distress Chest: Clear to auscultation bilaterally without wheezes, rhonchi or crackles, room air Cardiac: Regular rate and rhythm, S1-S2, no rubs murmurs or gallops, no peripheral edema Abdomen: Soft nontender nondistended without obvious hepatosplenomegaly, no ascites Extremities: Symmetrical in appearance without cyanosis, clubbing or effusion  Scheduled Meds:  Scheduled Meds: . aspirin  81 mg Oral Daily  . atorvastatin  80 mg Oral q1800  . isosorbide mononitrate  30 mg Oral Daily  . lisinopril  5 mg Oral Daily  . metoprolol tartrate  25 mg Oral BID  . mometasone-formoterol  2 puff Inhalation BID  . pantoprazole  40 mg Oral Daily  . prasugrel  10  mg Oral Daily  . sodium chloride  3 mL Intravenous Q12H   Continuous Infusions: . sodium chloride 10 mL (10/22/13 0301)  . heparin 1,100 Units/hr (10/22/13 0300)  . nitroGLYCERIN 10 mcg/min (10/22/13 1325)    Data Reviewed: Basic Metabolic Panel:  Recent Labs Lab 10/20/13 2245 10/21/13 0537 10/22/13 0900    NA 141 138 140  K 3.9 3.7 4.3  CL 104 104 104  CO2 19 21 23   GLUCOSE 121* 128* 112*  BUN 15 12 11   CREATININE 1.21 0.89 0.69  CALCIUM 10.1 9.0 8.6   Liver Function Tests:  Recent Labs Lab 10/20/13 2245 10/21/13 0537  AST 39* 41*  ALT 26 22  ALKPHOS 93 80  BILITOT 0.5 0.4  PROT 8.0 6.8  ALBUMIN 3.9 3.2*    Recent Labs Lab 10/21/13 0200 10/22/13 1250  LIPASE 15 20   CBC:  Recent Labs Lab 10/20/13 2245 10/21/13 0537 10/21/13 1900 10/22/13 0900  WBC 5.1 4.9  --  4.5  NEUTROABS 2.7  --   --   --   HGB 14.7 12.2*  --  10.9*  HCT 43.5 36.7*  --  33.1*  MCV 95.6 95.1  --  95.7  PLT 158 146* 277 135*   Cardiac Enzymes:  Recent Labs Lab 10/21/13 0200 10/21/13 0537 10/21/13 0956 10/21/13 1545 10/22/13 1250  TROPONINI 0.43* 2.24* 4.06* 7.06* 4.23*    Recent Results (from the past 240 hour(s))  MRSA PCR SCREENING     Status: None   Collection Time    10/21/13  4:43 AM      Result Value Ref Range Status   MRSA by PCR NEGATIVE  NEGATIVE Final   Comment:            The GeneXpert MRSA Assay (FDA     approved for NASAL specimens     only), is one component of a     comprehensive MRSA colonization     surveillance program. It is not     intended to diagnose MRSA     infection nor to guide or     monitor treatment for     MRSA infections.     Studies:  Recent x-ray studies have been reviewed in detail by the Attending Physician  Time spent :  [place], ANP [facility] Office  [phone] Pager [phone]   **If unable to reach the above provider after paging please contact the [facility] @ [phone]  On-Call/Text Page:      [email or web address]      password [email or web address]  If 7PM-7AM, please contact night-coverage [email or web address] Password [email or web address] 10/22/2013, 1:43 PM   LOS: 2 days   I have personally examined this patient and reviewed the entire database. I have reviewed the above note, made any necessary editorial changes, and  agree with its content.  Cherene Altes, MD [facility]

## 2013-10-22 NOTE — Progress Notes (Signed)
CARDIAC REHAB PHASE I   PRE:  Rate/Rhythm: 68 SR  BP:  Supine: 134/77  Sitting:   Standing:    SaO2: 99%RA  MODE:  Ambulation: 700 ft   POST:  Rate/Rhythm: 84 SR  BP:  Supine:   Sitting: 133/57  Standing:    SaO2: 97%RA 1430-1525 Pt walked 700 ft on RA with steady gait. Tolerated well. Still continues to have chest soreness that he states he has had all day. Began MI ed . Pt states cannot believe he had MI. Gave stent booklet, MI booklet and discussed effient and NTG use. Gave smoking cessation handout and encouraged pt to [email or web address] as needed. To recliner after walk. Will follow up tomorrow. Chest soreness did not worsen with walk.   Graylon Good, RN BSN  10/22/2013 3:23 PM

## 2013-10-22 NOTE — Progress Notes (Signed)
Patient ID: [name], male   DOB: 07/12/1960, 53 y.o.   MRN: [id number]   SUBJECTIVE:   Underwent cath yesterday with occluded D1 and otherwise moderate non-obstructive CAD. EF 45-50%. Underwent PCI of LAD with BMS but still with sluggish flow in D1 and distal LAD.   Still having intermittent CP. Remains on NTG.  Trop 7.06 yesterday. None drawn today. ECG with ? Minimal ST elevation in I  Scheduled Meds: . aspirin  81 mg Oral Daily  . atorvastatin  80 mg Oral q1800  . lisinopril  5 mg Oral Daily  . metoprolol tartrate  12.5 mg Oral BID  . mometasone-formoterol  2 puff Inhalation BID  . pantoprazole  40 mg Oral Daily  . prasugrel  10 mg Oral Daily  . sodium chloride  3 mL Intravenous Q12H   Continuous Infusions: . sodium chloride 10 mL (10/22/13 0301)  . heparin 1,100 Units/hr (10/22/13 0300)  . nitroGLYCERIN 20 mcg/min (10/22/13 0926)   PRN Meds:.sodium chloride, acetaminophen, acetaminophen, albuterol, morphine injection, nitroGLYCERIN, ondansetron (ZOFRAN) IV, sodium chloride    Filed Vitals:   10/22/13 0800 10/22/13 0900 10/22/13 1041 10/22/13 1100  BP: 135/89 142/81  148/86  Pulse:  71  71  Temp: 98 F (36.7 C)     TempSrc: Oral     Resp: 13   15  Height:      Weight:      SpO2: 98% 98% 98% 98%    Intake/Output Summary (Last 24 hours) at 10/22/13 1149 Last data filed at 10/22/13 1100  Gross per 24 hour  Intake 1923.5 ml  Output    826 ml  Net 1097.5 ml    LABS: Basic Metabolic Panel:  Recent Labs  10/21/13 0537 10/22/13 0900  NA 138 140  K 3.7 4.3  CL 104 104  CO2 21 23  GLUCOSE 128* 112*  BUN 12 11  CREATININE 0.89 0.69  CALCIUM 9.0 8.6   Liver Function Tests:  Recent Labs  10/20/13 2245 10/21/13 0537  AST 39* 41*  ALT 26 22  ALKPHOS 93 80  BILITOT 0.5 0.4  PROT 8.0 6.8  ALBUMIN 3.9 3.2*    Recent Labs  10/21/13 0200  LIPASE 15   CBC:  Recent Labs  10/20/13 2245 10/21/13 0537 10/21/13 1900 10/22/13 0900  WBC 5.1 4.9   --  4.5  NEUTROABS 2.7  --   --   --   HGB 14.7 12.2*  --  10.9*  HCT 43.5 36.7*  --  33.1*  MCV 95.6 95.1  --  95.7  PLT 158 146* 277 135*   Cardiac Enzymes:  Recent Labs  10/21/13 0537 10/21/13 0956 10/21/13 1545  TROPONINI 2.24* 4.06* 7.06*   BNP: No components found with this basename: POCBNP,  D-Dimer: No results found for this basename: DDIMER,  in the last 72 hours Hemoglobin A1C: No results found for this basename: HGBA1C,  in the last 72 hours Fasting Lipid Panel: No results found for this basename: CHOL, HDL, LDLCALC, TRIG, CHOLHDL, LDLDIRECT,  in the last 72 hours Thyroid Function Tests: No results found for this basename: TSH, T4TOTAL, FREET3, T3FREE, THYROIDAB,  in the last 72 hours Anemia Panel: No results found for this basename: VITAMINB12, FOLATE, FERRITIN, TIBC, IRON, RETICCTPCT,  in the last 72 hours  RADIOLOGY: Dg Chest 2 View  10/20/2013   CLINICAL DATA:  Centralized chest pain, cough, congestion, and sneezing. Thirty year smoker.  EXAM: CHEST  2 VIEW  COMPARISON:  06/18/2013  FINDINGS: Normal heart size and pulmonary vascularity. Mild hyperinflation and central interstitial changes suggesting emphysema and chronic bronchitis. No focal airspace disease or consolidation in the lungs. No blunting of costophrenic angles. No pneumothorax. Degenerative changes in the thoracic spine.  IMPRESSION: Emphysematous changes and chronic bronchitic changes in the lungs. No evidence of active pulmonary disease.   Electronically Signed   By: Lucienne Capers M.D.   On: 10/20/2013 23:35   Ct Angio Chest Pe W/cm &/or Wo Cm  10/21/2013   CLINICAL DATA:  Sudden onset midsternal chest pain and shortness of breath. History of DVT.  EXAM: CT ANGIOGRAPHY CHEST WITH CONTRAST  TECHNIQUE: Multidetector CT imaging of the chest was performed using the standard protocol during bolus administration of intravenous contrast. Multiplanar CT image reconstructions and MIPs were obtained to  evaluate the vascular anatomy.  CONTRAST:  100 mL Omnipaque 350  COMPARISON:  08/08/2012  FINDINGS: Technically adequate study with good opacification of the central and segmental pulmonary arteries. No focal filling defects are demonstrated. No evidence of significant pulmonary embolus.  Normal heart size. Normal caliber thoracic aorta. Calcification in the coronary arteries. Esophagus is mostly decompressed. No significant lymphadenopathy in the chest. Emphysematous changes and scattered interstitial fibrosis in the lungs. Basilar dependent atelectasis. Bronchial wall thickening with peribronchial infiltration consistent with chronic bronchitis/ bronchiolitis. No focal airspace disease or consolidation in the lungs. No pneumothorax. No effusion.  Visualized portions of the upper abdominal organs are grossly unremarkable. Degenerative changes in the thoracic spine. No destructive bone lesions appreciated.  Review of the MIP images confirms the above findings.  IMPRESSION: No evidence of significant pulmonary embolus. Emphysematous and bronchitic changes in the lungs.   Electronically Signed   By: Lucienne Capers M.D.   On: 10/21/2013 00:25   US Abdomen Complete  10/21/2013   CLINICAL DATA:  Abdominal pain, nausea and vomiting.  EXAM: ULTRASOUND ABDOMEN COMPLETE  COMPARISON:  None.  FINDINGS: Gallbladder:  No gallstones or wall thickening visualized. No sonographic Murphy sign noted.  Common bile duct:  Diameter: 3.8 mm, normal  Liver:  No focal lesion identified. Within normal limits in parenchymal echogenicity.  IVC:  No abnormality visualized.  Pancreas:  Visualized portion unremarkable.  Spleen:  Size and appearance within normal limits.  Right Kidney:  Length: 11.1 cm. Echogenicity within normal limits. No mass or hydronephrosis visualized.  Left Kidney:  Length: 12.2 cm. Echogenicity within normal limits. No mass or hydronephrosis visualized.  Abdominal aorta:  Limited visualization.  Overlying bowel gas.   Other findings:  None.  IMPRESSION: No acute abnormalities demonstrated.   Electronically Signed   By: Lucienne Capers M.D.   On: 10/21/2013 03:20    PHYSICAL EXAM General: NAD Neck: No JVD, no thyromegaly or thyroid nodule.  Lungs: Clear to auscultation bilaterally with normal respiratory effort. CV: Nondisplaced PMI.  Heart regular S1/S2, no S3/S4, no murmur.  No peripheral edema.  Abdomen: Soft, nontender, no hepatosplenomegaly, no distention.  Neurologic: Alert and oriented x 3.  Psych: Normal affect. Extremities: No clubbing or cyanosis.   TELEMETRY: Reviewed telemetry pt in NSR  ASSESSMENT AND PLAN: 53 yo smoker with history of multiple DVTs and PEs presented with chest pain and elevated troponin.   1. NSTEMI with mild ongoing CP - s/p PCI D1 with BMS with incomplete revascularization - EF 55% by echo 45-50% by cath 2. H/o PE/DVT in 2014 : Has IVC filter but not totally compliant with Xarelto (last dose on Sunday) now on heparin  3. Tobacco  abuse   Suspect ongoing CP is related to residual clot in D1. Would keep on heparin one more day. Continue Effient and ASA. Watch in SDU one more day. Start po IMDUB and increase metoprolol.  Will need ongoing anti-coagulation for previous DVT/PE. Given clot burden in D1 I feel he needs to continue Effient for at least 1 month (rather than Plavix). With increased risk of bleeding with triple therapy my preference would be that once heparin is off we treat with Effient and reduced dose apixaban 2.5 bid. Drop ASA. (WOEST trial). Also Amplify-EXT shows apixaban 2.5 bid is equivalent to full dose for preventing recurrent DVT/PE in patient who have already been treated for 6 months or more.   In short. Continue heparin today. If pain free tomorrow change to Effient 10 and apixaban 2.5 bid. Will need case manager to help get apixaban.   D/w Dr. Ellyn Hack.   Quillian Quince BensimhonMD 10/22/2013 11:49 AM

## 2013-10-23 LAB — BASIC METABOLIC PANEL
ANION GAP: 12 (ref 5–15)
BUN: 6 mg/dL (ref 6–23)
CO2: 22 meq/L (ref 19–32)
Calcium: 8.5 mg/dL (ref 8.4–10.5)
Chloride: 105 mEq/L (ref 96–112)
Creatinine, Ser: 0.63 mg/dL (ref 0.50–1.35)
GFR calc Af Amer: >90 mL/min (ref >90)
GFR calc non Af Amer: >90 mL/min (ref >90)
Glucose, Bld: 99 mg/dL (ref 70–99)
POTASSIUM: 4 meq/L (ref 3.7–5.3)
SODIUM: 139 meq/L (ref 137–147)

## 2013-10-23 LAB — CBC
HCT: 31.5 % — ABNORMAL LOW (ref 39.0–52.0)
Hemoglobin: 10.4 g/dL — ABNORMAL LOW (ref 13.0–17.0)
MCH: 31.3 pg (ref 26.0–34.0)
MCHC: 33 g/dL (ref 30.0–36.0)
MCV: 94.9 fL (ref 78.0–100.0)
Platelets: 121 10*3/uL — ABNORMAL LOW (ref 150–400)
RBC: 3.32 MIL/uL — ABNORMAL LOW (ref 4.22–5.81)
RDW: 14.4 % (ref 11.5–15.5)
WBC: 3.2 10*3/uL — ABNORMAL LOW (ref 4.0–10.5)

## 2013-10-23 LAB — HEPARIN LEVEL (UNFRACTIONATED)

## 2013-10-23 LAB — TROPONIN I: TROPONIN I: 2.6 ng/mL — AB (ref <0.30)

## 2013-10-23 MED ORDER — APIXABAN 2.5 MG PO TABS
2.5000 mg | ORAL_TABLET | Freq: Two times a day (BID) | ORAL | Status: DC
  Administered 2013-10-23 – 2013-10-27 (×9): 2.5 mg via ORAL
  Filled 2013-10-23 (×11): qty 1

## 2013-10-23 MED ORDER — SODIUM CHLORIDE 0.9 % IV SOLN
INTRAVENOUS | Status: DC
  Administered 2013-10-22: 19:00:00 via INTRAVENOUS

## 2013-10-23 MED ORDER — PANTOPRAZOLE SODIUM 40 MG PO TBEC
40.0000 mg | DELAYED_RELEASE_TABLET | Freq: Two times a day (BID) | ORAL | Status: DC
Start: 2013-10-23 — End: 2013-10-27
  Administered 2013-10-23 – 2013-10-27 (×8): 40 mg via ORAL
  Filled 2013-10-23 (×7): qty 1

## 2013-10-23 MED ORDER — OXYCODONE-ACETAMINOPHEN 5-325 MG PO TABS
1.0000 | ORAL_TABLET | ORAL | Status: DC | PRN
  Administered 2013-10-23 – 2013-10-26 (×9): 2 via ORAL
  Filled 2013-10-23 (×9): qty 2

## 2013-10-23 MED ORDER — ISOSORBIDE MONONITRATE ER 60 MG PO TB24
60.0000 mg | ORAL_TABLET | Freq: Every day | ORAL | Status: DC
  Administered 2013-10-23 – 2013-10-24 (×2): 60 mg via ORAL
  Filled 2013-10-23 (×2): qty 1

## 2013-10-23 MED ORDER — ALPRAZOLAM 0.25 MG PO TABS
0.2500 mg | ORAL_TABLET | Freq: Every day | ORAL | Status: DC | PRN
  Administered 2013-10-23 – 2013-10-25 (×2): 0.25 mg via ORAL
  Filled 2013-10-23 (×2): qty 1

## 2013-10-23 MED ORDER — MORPHINE SULFATE 2 MG/ML IJ SOLN: 1.0000 mg | INTRAMUSCULAR | Status: DC | PRN

## 2013-10-23 MED ORDER — GI COCKTAIL ~~LOC~~
30.0000 mL | Freq: Once | ORAL | Status: AC
  Administered 2013-10-23: 30 mL via ORAL
  Filled 2013-10-23: qty 30

## 2013-10-23 NOTE — Progress Notes (Signed)
[place] for Apixaban Indication:hx DVT/PE  No Known Allergies  Patient Measurements: Height: 6\' 2"  (188 cm) Weight: 166 lb 14.2 oz (75.7 kg) IBW/kg (Calculated) : 82.2  Vital Signs: Temp: 98 F (36.7 C) (07/30 0813) Temp src: Oral (07/30 0813) BP: 127/85 mmHg (07/30 0813) Pulse Rate: 68 (07/29 2325)  Labs:  Recent Labs  10/21/13 0537 10/21/13 0956 10/21/13 1545 10/21/13 1900 10/22/13 0900 10/22/13 1000 10/22/13 1250 10/22/13 1838 10/23/13 0326  HGB 12.2*  --   --   --  10.9*  --   --   --  10.4*  HCT 36.7*  --   --   --  33.1*  --   --   --  31.5*  PLT 146*  --   --  277 135*  --   --   --  121*  APTT 25  --   --   --   --   --   --   --   --   HEPARINUNFRC <0.10*  --   --   --   --  0.59  --  0.55 <0.10*  CREATININE 0.89  --   --   --  0.69  --   --   --  0.63  TROPONINI 2.24* 4.06* 7.06*  --   --   --  4.23*  --   --     Estimated Creatinine Clearance: 114.3 ml/min (by C-G formula based on Cr of 0.63).   Medical History: Past Medical History  Diagnosis Date  . Asthma   . DVT (deep venous thrombosis)   . Pulmonary embolism     Medications:  Albuterol  Advair  Xarelto  Assessment: 53 yo male admitted with chest pain, Xarelto placed on hold. Patient is now post cath with BMS and started on effient. Heparin was restarted post cath and continued yesterday d/t chest pain. New orders to transition patient to low dose apixaban for his previous DVT based on Einstein trial data.  Goal of Therapy:  Heparin level 0.3-0.7 units/ml Monitor platelets by anticoagulation protocol: Yes   Plan:  Stop Heparin Apixaban 2.5mg  bid  Thank you for allowing pharmacy to be a part of this patients care team.  Erin Hearing PharmD., BCPS Clinical Pharmacist Pager [phone] 10/23/2013 9:52 AM

## 2013-10-23 NOTE — Progress Notes (Signed)
[facility]  Hue Frick YNW:[id number] DOB: [birth year] DOA: 10/20/2013 PCP: [name], MD   Brief narrative: 53 y.o. M PMHx recurrent DVT/PE on Xarelto, asthma, tobacco abuse, alcohol use who  presented to the ER with chest pressure. 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 This was associated with sweating and some nausea. EMS was called and he was brought to the ER. En route he was given 3 sublingual nitroglycerin with only minimal benefit.   In the ER EKG, troponin x1 unremarkable. Underwent a CTA chest which was negative for PE. He was still experiencing CP and due to multiple risk factors for CAD he was admitted  HPI/Subjective: Having less epigastric pain but did have episodes with eating and without eating  Assessment/Plan:    CAD/NSTEMI (non-ST elevated myocardial infarction)-post PCI/BMS -Cards consulting -subsequently taken to the cath lab 7/28: radial approach-severe 3V CAD s/p PTCA proximal and mid D1 but Cards also documents post intervention with sluggish flow -started on Effient and ASA x 1 month (due to clot burden in vessel) then OK to convert to Plavix alone in setting of preadmit Xarelto per Cards -due to increased bleeding risk with triple therapy (ASA, Eliquis and Effient) Cards recommended to use lower dose of Effient and decrease ASA dose -per CM: given 30 day free card for the Effient and since on preferred list for Medicaid co pay thereafter will be $3 -Cards transitioning IV NTG to Imdur -cont Beta blocker and statin-ACE added 7/29 -Cards rec keeping on SDU due tom ongoing CP   Mild systolic dysfunction -EF 65% on cath -ACE added this admit   Uncontrolled HTN -as above   Epigastric pain -seems to have a GI component as well as a cardiac component -began PPI 7/29 with some improvement in  sx's -Lipase normal -check serum H Pylori but preliminary ISR <0.40 so doubt H pylori positive  ? ETOH abuse - NOT ACCURATE -actually drinks sporadically and primarily <3 beers while watching sports therefore DOES NOT fit criteria for abuse (averages 3-5 drinks per WEEK at max)    COPD  -compensated    Personal history of pulmonary embolism (2014)/  History of DVT -recurrent DVT documented > lifelong Xarelto    Tobacco abuse -Patient counseled on need to absolutely stop smoking now. -Prior to discharge plan to discuss treatment options (Wellbutrin vs nicotine patch)   DVT prophylaxis: IV heparin Code Status: Full Family Communication: Patient only no family at bedside Disposition Plan/Expected LOS: Stepdown  Consultants: Cardiology  Procedures: Left heart catheterization  7/28  Cultures: None  Antibiotics: None  Objective: Blood pressure 130/91, pulse 68, temperature 98 F (36.7 C), temperature source Oral, resp. rate 17, height 6\' 2"  (1.88 m), weight 166 lb 14.2 oz (75.7 kg), SpO2 99.00%.  Intake/Output Summary (Last 24 hours) at 10/23/13 1407 Last data filed at 10/23/13 1300  Gross per 24 hour  Intake  679.5 ml  Output      0 ml  Net  679.5 ml   Exam: Gen: No acute respiratory distress Chest: Clear to auscultation bilaterally without wheezes, rhonchi or crackles, room air Cardiac: Regular rate and rhythm, S1-S2, no rubs murmurs or gallops, no peripheral edema Abdomen: Soft nontender nondistended without obvious hepatosplenomegaly, no ascites Extremities: Symmetrical in appearance without cyanosis, clubbing or effusion  Scheduled Meds:  Scheduled Meds: . apixaban  2.5 mg Oral  BID  . atorvastatin  80 mg Oral q1800  . isosorbide mononitrate  60 mg Oral Daily  . lisinopril  5 mg Oral Daily  . metoprolol tartrate  25 mg Oral BID  . mometasone-formoterol  2 puff Inhalation BID  . pantoprazole  40 mg Oral Daily  . prasugrel  10 mg Oral Daily   Continuous  Infusions: . sodium chloride 10 mL (10/22/13 0301)  . sodium chloride 10 mL/hr at 10/22/13 1900    Data Reviewed: Basic Metabolic Panel:  Recent Labs Lab 10/20/13 2245 10/21/13 0537 10/22/13 0900 10/23/13 0326  NA 141 138 140 139  K 3.9 3.7 4.3 4.0  CL 104 104 104 105  CO2 19 21 23 22   GLUCOSE 121* 128* 112* 99  BUN 15 12 11 6   CREATININE 1.21 0.89 0.69 0.63  CALCIUM 10.1 9.0 8.6 8.5   Liver Function Tests:  Recent Labs Lab 10/20/13 2245 10/21/13 0537  AST 39* 41*  ALT 26 22  ALKPHOS 93 80  BILITOT 0.5 0.4  PROT 8.0 6.8  ALBUMIN 3.9 3.2*    Recent Labs Lab 10/21/13 0200 10/22/13 1250  LIPASE 15 20   CBC:  Recent Labs Lab 10/20/13 2245 10/21/13 0537 10/21/13 1900 10/22/13 0900 10/23/13 0326  WBC 5.1 4.9  --  4.5 3.2*  NEUTROABS 2.7  --   --   --   --   HGB 14.7 12.2*  --  10.9* 10.4*  HCT 43.5 36.7*  --  33.1* 31.5*  MCV 95.6 95.1  --  95.7 94.9  PLT 158 146* 277 135* 121*   Cardiac Enzymes:  Recent Labs Lab 10/21/13 0537 10/21/13 0956 10/21/13 1545 10/22/13 1250 10/23/13 1103  TROPONINI 2.24* 4.06* 7.06* 4.23* 2.60*    Recent Results (from the past 240 hour(s))  MRSA PCR SCREENING     Status: None   Collection Time    10/21/13  4:43 AM      Result Value Ref Range Status   MRSA by PCR NEGATIVE  NEGATIVE Final   Comment:            The GeneXpert MRSA Assay (FDA     approved for NASAL specimens     only), is one component of a     comprehensive MRSA colonization     surveillance program. It is not     intended to diagnose MRSA     infection nor to guide or     monitor treatment for     MRSA infections.     Studies:  Recent x-ray studies have been reviewed in detail by the Attending Physician  Time spent :  [place], ANP [facility] Office  [phone] Pager [phone]   **If unable to reach the above provider after paging please contact the [facility] @ [phone]  On-Call/Text Page:       [email or web address]      password [email or web address]  If 7PM-7AM, please contact night-coverage [email or web address] Password [email or web address] 10/23/2013, 2:07 PM   LOS: 3 days   Patient seen and examined. Agree with the above assessment and plan.  Patient reports worsening burning sensation substernally after eating. Will increased PPI frequency.  Hosie Poisson, MD [phone]

## 2013-10-23 NOTE — Progress Notes (Signed)
Morphine 2 mg given IV for c/o chest discomfort, 7-8, "pressure", thought might be heartburn or gas according to patient but not getting better, present x approx 1 hour, medicated and instructed pt to call sooner if having pain, Hazle Nordmann RN

## 2013-10-23 NOTE — Progress Notes (Signed)
CARDIAC REHAB PHASE I   PRE:  Rate/Rhythm: 74 SR  BP:  Supine: 120/84  Sitting:   Standing:    SaO2:   MODE:  Ambulation: 1050 ft   POST:  Rate/Rhythm: 72 SR  BP:  Supine:   Sitting: 128/70  Standing:    SaO2:  1340-1410 Pt stated CP earlier at 8 and he got morphine. Stated it was a 4 now and did not worsen with walk. Pt walked 1050 ft with steady gait. Talked and laughed during walk. Completed ed except diet. Gave ex ed and discussed CRP 2. Pt gave permission for referral to [facility]. Will follow up tomorrow.   Graylon Good, RN BSN  10/23/2013 2:06 PM

## 2013-10-23 NOTE — Discharge Instructions (Addendum)
Follow with Primary MD [name], MD in 7 days   Get CBC, CMP, 2 view Chest X ray checked  by Primary MD next visit.    Activity: As tolerated with Full fall precautions use walker/cane & assistance as needed   Disposition Home     Diet: Heart Healthy  Check your Weight same time everyday, if you gain over 2 pounds, or you develop in leg swelling, experience more shortness of breath or chest pain, call your Primary MD immediately. Follow Cardiac Low Salt Diet and 1.8 lit/day fluid restriction.   On your next visit with her primary care physician please Get Medicines reviewed and adjusted.  Please request your Prim.MD to go over all Hospital Tests and Procedure/Radiological results at the follow up, please get all Hospital records sent to your Prim MD by signing hospital release before you go home.   If you experience worsening of your admission symptoms, develop shortness of breath, life threatening emergency, suicidal or homicidal thoughts you must seek medical attention immediately by calling 911 or calling your MD immediately  if symptoms less severe.  You Must read complete instructions/literature along with all the possible adverse reactions/side effects for all the Medicines you take and that have been prescribed to you. Take any new Medicines after you have completely understood and accpet all the possible adverse reactions/side effects.   Do not drive, operating heavy machinery, perform activities at heights, swimming or participation in water activities or provide baby sitting services if your were admitted for syncope or siezures until you have seen by Primary MD or a Neurologist and advised to do so again.  Do not drive when taking Pain medications.    Do not take more than prescribed Pain, Sleep and Anxiety Medications  Special Instructions: If you have smoked or chewed Tobacco  in the last 2 yrs please stop smoking, stop any regular Alcohol  and or any Recreational  drug use.  Wear Seat belts while driving.   Please note  You were cared for by a hospitalist during your hospital stay. If you have any questions about your discharge medications or the care you received while you were in the hospital after you are discharged, you can call the unit and asked to speak with the hospitalist on call if the hospitalist that took care of you is not available. Once you are discharged, your primary care physician will handle any further medical issues. Please note that NO REFILLS for any discharge medications will be authorized once you are discharged, as it is imperative that you return to your primary care physician (or establish a relationship with a primary care physician if you do not have one) for your aftercare needs so that they can reassess your need for medications and monitor your lab values.      Information on my medicine - ELIQUIS (apixaban)  This medication education was reviewed with me or my healthcare representative as part of my discharge preparation.  The pharmacist that spoke with me during my hospital stay was:  Georgina Peer, [facility]  Why was Eliquis prescribed for you? Eliquis was prescribed for you to reduce the risk of forming blood clots that can cause a stroke.  What do You need to know about Eliquis ? Take your Eliquis TWICE DAILY - one tablet in the morning and one tablet in the evening with or without food.  It would be best to take the doses about the same time each day.  If you  have difficulty swallowing the tablet whole please discuss with your pharmacist how to take the medication safely.  Take Eliquis exactly as prescribed by your doctor and DO NOT stop taking Eliquis without talking to the doctor who prescribed the medication.  Stopping may increase your risk of developing a new clot or stroke.  Refill your prescription before you run out.  After discharge, you should have regular check-up appointments with your  healthcare provider that is prescribing your Eliquis.  In the future your dose may need to be changed if your kidney function or weight changes by a significant amount or as you get older.  What do you do if you miss a dose? If you miss a dose, take it as soon as you remember on the same day and resume taking twice daily.  Do not take more than one dose of ELIQUIS at the same time.  Important Safety Information A possible side effect of Eliquis is bleeding. You should call your healthcare provider right away if you experience any of the following:   Bleeding from an injury or your nose that does not stop.   Unusual colored urine (red or dark brown) or unusual colored stools (red or black).   Unusual bruising for unknown reasons.   A serious fall or if you hit your head (even if there is no bleeding).  Some medicines may interact with Eliquis and might increase your risk of bleeding or clotting while on Eliquis. To help avoid this, consult your healthcare provider or pharmacist prior to using any new prescription or non-prescription medications, including herbals, vitamins, non-steroidal anti-inflammatory drugs (NSAIDs) and supplements.  This website has more information on Eliquis (apixaban): [email or web address].

## 2013-10-23 NOTE — Progress Notes (Signed)
Patient ID: [name], male   DOB: [birth year], 53 y.o.   MRN: [id number]   SUBJECTIVE:   Underwent cath Tuesday with occluded D1 and otherwise moderate non-obstructive CAD. EF 45-50%. Underwent PCI of D1 with BMS but still with sluggish flow in D1 and distal LAD.   Troponin trending down yesterday.  Had chest pain early this morning for about an hour.  ECG was normal.  No chest pain now.    Scheduled Meds: . apixaban  2.5 mg Oral BID  . atorvastatin  80 mg Oral q1800  . isosorbide mononitrate  60 mg Oral Daily  . lisinopril  5 mg Oral Daily  . metoprolol tartrate  25 mg Oral BID  . mometasone-formoterol  2 puff Inhalation BID  . pantoprazole  40 mg Oral Daily  . prasugrel  10 mg Oral Daily   Continuous Infusions: . sodium chloride 10 mL (10/22/13 0301)  . sodium chloride 10 mL/hr at 10/22/13 1900   PRN Meds:.acetaminophen, acetaminophen, albuterol, morphine injection, nitroGLYCERIN, ondansetron (ZOFRAN) IV    Filed Vitals:   10/22/13 2037 10/22/13 2325 10/23/13 0429 10/23/13 0813  BP: 124/75 113/68 136/94 127/85  Pulse: 62 68    Temp: 98 F (36.7 C) 98.3 F (36.8 C) 98.3 F (36.8 C) 98 F (36.7 C)  TempSrc: Oral Oral  Oral  Resp:  20  19  Height:      Weight:      SpO2: 96% 98%  99%    Intake/Output Summary (Last 24 hours) at 10/23/13 0936 Last data filed at 10/23/13 0900  Gross per 24 hour  Intake 1051.9 ml  Output    200 ml  Net  851.9 ml    LABS: Basic Metabolic Panel:  Recent Labs  10/22/13 0900 10/23/13 0326  NA 140 139  K 4.3 4.0  CL 104 105  CO2 23 22  GLUCOSE 112* 99  BUN 11 6  CREATININE 0.69 0.63  CALCIUM 8.6 8.5   Liver Function Tests:  Recent Labs  10/20/13 2245 10/21/13 0537  AST 39* 41*  ALT 26 22  ALKPHOS 93 80  BILITOT 0.5 0.4  PROT 8.0 6.8  ALBUMIN 3.9 3.2*    Recent Labs  10/21/13 0200 10/22/13 1250  LIPASE 15 20   CBC:  Recent Labs  10/20/13 2245  10/22/13 0900 10/23/13 0326  WBC 5.1  < > 4.5 3.2*    NEUTROABS 2.7  --   --   --   HGB 14.7  < > 10.9* 10.4*  HCT 43.5  < > 33.1* 31.5*  MCV 95.6  < > 95.7 94.9  PLT 158  < > 135* 121*  < > = values in this interval not displayed. Cardiac Enzymes:  Recent Labs  10/21/13 0956 10/21/13 1545 10/22/13 1250  TROPONINI 4.06* 7.06* 4.23*   BNP: No components found with this basename: POCBNP,  D-Dimer: No results found for this basename: DDIMER,  in the last 72 hours Hemoglobin A1C: No results found for this basename: HGBA1C,  in the last 72 hours Fasting Lipid Panel: No results found for this basename: CHOL, HDL, LDLCALC, TRIG, CHOLHDL, LDLDIRECT,  in the last 72 hours Thyroid Function Tests: No results found for this basename: TSH, T4TOTAL, FREET3, T3FREE, THYROIDAB,  in the last 72 hours Anemia Panel: No results found for this basename: VITAMINB12, FOLATE, FERRITIN, TIBC, IRON, RETICCTPCT,  in the last 72 hours  RADIOLOGY: Dg Chest 2 View  10/20/2013   CLINICAL DATA:  Centralized chest pain,  cough, congestion, and sneezing. Thirty year smoker.  EXAM: CHEST  2 VIEW  COMPARISON:  06/18/2013  FINDINGS: Normal heart size and pulmonary vascularity. Mild hyperinflation and central interstitial changes suggesting emphysema and chronic bronchitis. No focal airspace disease or consolidation in the lungs. No blunting of costophrenic angles. No pneumothorax. Degenerative changes in the thoracic spine.  IMPRESSION: Emphysematous changes and chronic bronchitic changes in the lungs. No evidence of active pulmonary disease.   Electronically Signed   By: Lucienne Capers M.D.   On: 10/20/2013 23:35   Ct Angio Chest Pe W/cm &/or Wo Cm  10/21/2013   CLINICAL DATA:  Sudden onset midsternal chest pain and shortness of breath. History of DVT.  EXAM: CT ANGIOGRAPHY CHEST WITH CONTRAST  TECHNIQUE: Multidetector CT imaging of the chest was performed using the standard protocol during bolus administration of intravenous contrast. Multiplanar CT image  reconstructions and MIPs were obtained to evaluate the vascular anatomy.  CONTRAST:  100 mL Omnipaque 350  COMPARISON:  08/08/2012  FINDINGS: Technically adequate study with good opacification of the central and segmental pulmonary arteries. No focal filling defects are demonstrated. No evidence of significant pulmonary embolus.  Normal heart size. Normal caliber thoracic aorta. Calcification in the coronary arteries. Esophagus is mostly decompressed. No significant lymphadenopathy in the chest. Emphysematous changes and scattered interstitial fibrosis in the lungs. Basilar dependent atelectasis. Bronchial wall thickening with peribronchial infiltration consistent with chronic bronchitis/ bronchiolitis. No focal airspace disease or consolidation in the lungs. No pneumothorax. No effusion.  Visualized portions of the upper abdominal organs are grossly unremarkable. Degenerative changes in the thoracic spine. No destructive bone lesions appreciated.  Review of the MIP images confirms the above findings.  IMPRESSION: No evidence of significant pulmonary embolus. Emphysematous and bronchitic changes in the lungs.   Electronically Signed   By: Lucienne Capers M.D.   On: 10/21/2013 00:25   US Abdomen Complete  10/21/2013   CLINICAL DATA:  Abdominal pain, nausea and vomiting.  EXAM: ULTRASOUND ABDOMEN COMPLETE  COMPARISON:  None.  FINDINGS: Gallbladder:  No gallstones or wall thickening visualized. No sonographic Murphy sign noted.  Common bile duct:  Diameter: 3.8 mm, normal  Liver:  No focal lesion identified. Within normal limits in parenchymal echogenicity.  IVC:  No abnormality visualized.  Pancreas:  Visualized portion unremarkable.  Spleen:  Size and appearance within normal limits.  Right Kidney:  Length: 11.1 cm. Echogenicity within normal limits. No mass or hydronephrosis visualized.  Left Kidney:  Length: 12.2 cm. Echogenicity within normal limits. No mass or hydronephrosis visualized.  Abdominal aorta:   Limited visualization.  Overlying bowel gas.  Other findings:  None.  IMPRESSION: No acute abnormalities demonstrated.   Electronically Signed   By: Lucienne Capers M.D.   On: 10/21/2013 03:20    PHYSICAL EXAM General: NAD Neck: No JVD, no thyromegaly or thyroid nodule.  Lungs: Clear to auscultation bilaterally with normal respiratory effort. CV: Nondisplaced PMI.  Heart regular S1/S2, no S3/S4, no murmur.  No peripheral edema.  Abdomen: Soft, nontender, no hepatosplenomegaly, no distention.  Neurologic: Alert and oriented x 3.  Psych: Normal affect. Extremities: No clubbing or cyanosis.   TELEMETRY: Reviewed telemetry pt in NSR  ASSESSMENT AND PLAN: 53 yo smoker with history of multiple DVTs and PEs presented with chest pain and elevated troponin.   1. NSTEMI with an episode of chest pain this morning.  - s/p PCI D1 with BMS with incomplete revascularization - EF 55% by echo 45-50% by cath 2. H/o  PE/DVT in 2014 - Has IVC filter but not totally compliant with Xarelto   - CTA this admission did not show PE.  3. Tobacco abuse   Suspect episodes of CP post-PCI are related to residual clot in D1, currently CP-free. ECG unchanged.  Will repeat troponin today.  Increase Imdur to 60 mg daily and stop NTG gtt (on very low dose).  Will need ongoing anti-coagulation for previous DVT/PE. Given clot burden in D1 I feel he needs to continue Effient for at least 1 month (rather than Plavix). With increased risk of bleeding with triple therapy we plan to treat with Effient + Eliquis (Eliquis begun today). Drop ASA. (WOEST trial). Also Amplify-EXT shows apixaban 2.5 bid is equivalent to full dose for preventing recurrent DVT/PE in patient who have already been treated for 6 months or more.   Watch in step-down today.   Reonna Finlayson [facility] 10/23/2013 9:36 AM

## 2013-10-24 DIAGNOSIS — I25810 Atherosclerosis of coronary artery bypass graft(s) without angina pectoris: Secondary | ICD-10-CM

## 2013-10-24 DIAGNOSIS — Z86718 Personal history of other venous thrombosis and embolism: Secondary | ICD-10-CM

## 2013-10-24 DIAGNOSIS — I5021 Acute systolic (congestive) heart failure: Secondary | ICD-10-CM

## 2013-10-24 DIAGNOSIS — I509 Heart failure, unspecified: Secondary | ICD-10-CM

## 2013-10-24 DIAGNOSIS — I209 Angina pectoris, unspecified: Secondary | ICD-10-CM

## 2013-10-24 LAB — CBC
HEMATOCRIT: 32 % — AB (ref 39.0–52.0)
Hemoglobin: 10.6 g/dL — ABNORMAL LOW (ref 13.0–17.0)
MCH: 31.3 pg (ref 26.0–34.0)
MCHC: 33.1 g/dL (ref 30.0–36.0)
MCV: 94.4 fL (ref 78.0–100.0)
PLATELETS: 130 10*3/uL — AB (ref 150–400)
RBC: 3.39 MIL/uL — ABNORMAL LOW (ref 4.22–5.81)
RDW: 14.1 % (ref 11.5–15.5)
WBC: 3.5 10*3/uL — ABNORMAL LOW (ref 4.0–10.5)

## 2013-10-24 LAB — BASIC METABOLIC PANEL
ANION GAP: 12 (ref 5–15)
BUN: 7 mg/dL (ref 6–23)
CALCIUM: 8.9 mg/dL (ref 8.4–10.5)
CO2: 25 meq/L (ref 19–32)
Chloride: 103 mEq/L (ref 96–112)
Creatinine, Ser: 0.78 mg/dL (ref 0.50–1.35)
GFR calc Af Amer: >90 mL/min (ref >90)
Glucose, Bld: 105 mg/dL — ABNORMAL HIGH (ref 70–99)
Potassium: 4.3 mEq/L (ref 3.7–5.3)
SODIUM: 140 meq/L (ref 137–147)

## 2013-10-24 LAB — HELICOBACTER PYLORI ABS-IGG+IGA, BLD
H Pylori IgA: 5 U/mL (ref <9.0)
H Pylori IgG: <0.4 {ISR}

## 2013-10-24 MED ORDER — ISOSORBIDE MONONITRATE ER 30 MG PO TB24
30.0000 mg | ORAL_TABLET | Freq: Every day | ORAL | Status: DC
  Administered 2013-10-25 – 2013-10-27 (×3): 30 mg via ORAL
  Filled 2013-10-24 (×3): qty 1

## 2013-10-24 NOTE — Progress Notes (Addendum)
Patient ID: [name], male   DOB: 04-28-1960, 53 y.o.   MRN: [id number]   SUBJECTIVE:   Underwent cath Tuesday with occluded D1 and otherwise moderate non-obstructive CAD. EF 45-50%. Had PCI of D1 with BMS but still with sluggish flow in D1 and distal LAD.   Troponin trending down yesterday.    Chest pain resolved. Complaining of headache.   BP soft and mildly orthostatic.   Scheduled Meds: . apixaban  2.5 mg Oral BID  . atorvastatin  80 mg Oral q1800  . isosorbide mononitrate  60 mg Oral Daily  . lisinopril  5 mg Oral Daily  . metoprolol tartrate  25 mg Oral BID  . mometasone-formoterol  2 puff Inhalation BID  . pantoprazole  40 mg Oral BID  . prasugrel  10 mg Oral Daily   Continuous Infusions: . sodium chloride 10 mL (10/22/13 0301)  . sodium chloride 10 mL/hr at 10/22/13 1900   PRN Meds:.acetaminophen, acetaminophen, albuterol, ALPRAZolam, morphine injection, nitroGLYCERIN, ondansetron (ZOFRAN) IV, oxyCODONE-acetaminophen    Filed Vitals:   10/24/13 0342 10/24/13 0819 10/24/13 0850 10/24/13 1200  BP: 113/68 93/68 96/57  88/51  Pulse:      Temp: 97.8 F (36.6 C) 97.8 F (36.6 C)  97.8 F (36.6 C)  TempSrc: Oral Oral  Oral  Resp:  18  20  Height:      Weight:      SpO2: 99% 98%  99%   No intake or output data in the 24 hours ending 10/24/13 1543  LABS: Basic Metabolic Panel:  Recent Labs  10/23/13 0326 10/24/13 0251  NA 139 140  K 4.0 4.3  CL 105 103  CO2 22 25  GLUCOSE 99 105*  BUN 6 7  CREATININE 0.63 0.78  CALCIUM 8.5 8.9   Liver Function Tests: No results found for this basename: AST, ALT, ALKPHOS, BILITOT, PROT, ALBUMIN,  in the last 72 hours  Recent Labs  10/22/13 1250  LIPASE 20   CBC:  Recent Labs  10/23/13 0326 10/24/13 0251  WBC 3.2* 3.5*  HGB 10.4* 10.6*  HCT 31.5* 32.0*  MCV 94.9 94.4  PLT 121* 130*   Cardiac Enzymes:  Recent Labs  10/21/13 1545 10/22/13 1250 10/23/13 1103  TROPONINI 7.06* 4.23* 2.60*    BNP: No components found with this basename: POCBNP,  D-Dimer: No results found for this basename: DDIMER,  in the last 72 hours Hemoglobin A1C: No results found for this basename: HGBA1C,  in the last 72 hours Fasting Lipid Panel: No results found for this basename: CHOL, HDL, LDLCALC, TRIG, CHOLHDL, LDLDIRECT,  in the last 72 hours Thyroid Function Tests: No results found for this basename: TSH, T4TOTAL, FREET3, T3FREE, THYROIDAB,  in the last 72 hours Anemia Panel: No results found for this basename: VITAMINB12, FOLATE, FERRITIN, TIBC, IRON, RETICCTPCT,  in the last 72 hours  RADIOLOGY: Dg Chest 2 View  10/20/2013   CLINICAL DATA:  Centralized chest pain, cough, congestion, and sneezing. Thirty year smoker.  EXAM: CHEST  2 VIEW  COMPARISON:  06/18/2013  FINDINGS: Normal heart size and pulmonary vascularity. Mild hyperinflation and central interstitial changes suggesting emphysema and chronic bronchitis. No focal airspace disease or consolidation in the lungs. No blunting of costophrenic angles. No pneumothorax. Degenerative changes in the thoracic spine.  IMPRESSION: Emphysematous changes and chronic bronchitic changes in the lungs. No evidence of active pulmonary disease.   Electronically Signed   By: Lucienne Capers M.D.   On: 10/20/2013 23:35   Ct Angio  Chest Pe W/cm &/or Wo Cm  10/21/2013   CLINICAL DATA:  Sudden onset midsternal chest pain and shortness of breath. History of DVT.  EXAM: CT ANGIOGRAPHY CHEST WITH CONTRAST  TECHNIQUE: Multidetector CT imaging of the chest was performed using the standard protocol during bolus administration of intravenous contrast. Multiplanar CT image reconstructions and MIPs were obtained to evaluate the vascular anatomy.  CONTRAST:  100 mL Omnipaque 350  COMPARISON:  08/08/2012  FINDINGS: Technically adequate study with good opacification of the central and segmental pulmonary arteries. No focal filling defects are demonstrated. No evidence of  significant pulmonary embolus.  Normal heart size. Normal caliber thoracic aorta. Calcification in the coronary arteries. Esophagus is mostly decompressed. No significant lymphadenopathy in the chest. Emphysematous changes and scattered interstitial fibrosis in the lungs. Basilar dependent atelectasis. Bronchial wall thickening with peribronchial infiltration consistent with chronic bronchitis/ bronchiolitis. No focal airspace disease or consolidation in the lungs. No pneumothorax. No effusion.  Visualized portions of the upper abdominal organs are grossly unremarkable. Degenerative changes in the thoracic spine. No destructive bone lesions appreciated.  Review of the MIP images confirms the above findings.  IMPRESSION: No evidence of significant pulmonary embolus. Emphysematous and bronchitic changes in the lungs.   Electronically Signed   By: Lucienne Capers M.D.   On: 10/21/2013 00:25   US Abdomen Complete  10/21/2013   CLINICAL DATA:  Abdominal pain, nausea and vomiting.  EXAM: ULTRASOUND ABDOMEN COMPLETE  COMPARISON:  None.  FINDINGS: Gallbladder:  No gallstones or wall thickening visualized. No sonographic Murphy sign noted.  Common bile duct:  Diameter: 3.8 mm, normal  Liver:  No focal lesion identified. Within normal limits in parenchymal echogenicity.  IVC:  No abnormality visualized.  Pancreas:  Visualized portion unremarkable.  Spleen:  Size and appearance within normal limits.  Right Kidney:  Length: 11.1 cm. Echogenicity within normal limits. No mass or hydronephrosis visualized.  Left Kidney:  Length: 12.2 cm. Echogenicity within normal limits. No mass or hydronephrosis visualized.  Abdominal aorta:  Limited visualization.  Overlying bowel gas.  Other findings:  None.  IMPRESSION: No acute abnormalities demonstrated.   Electronically Signed   By: Lucienne Capers M.D.   On: 10/21/2013 03:20    PHYSICAL EXAM General: NAD Neck: No JVD, no thyromegaly or thyroid nodule.  Lungs: Clear to  auscultation bilaterally with normal respiratory effort. CV: Nondisplaced PMI.  Heart regular S1/S2, no S3/S4, no murmur.  No peripheral edema.  Abdomen: Soft, nontender, no hepatosplenomegaly, no distention.  Neurologic: Alert and oriented x 3.  Psych: Normal affect. Extremities: No clubbing or cyanosis.   TELEMETRY: Reviewed telemetry pt in NSR  ASSESSMENT AND PLAN: 53 yo smoker with history of multiple DVTs and PEs presented with chest pain and elevated troponin.   1. NSTEMI with an episode of chest pain this morning.  - s/p PCI D1 with BMS with incomplete revascularization - EF 55% by echo 45-50% by cath 2. H/o PE/DVT in 2014 - Has IVC filter but not totally compliant with Xarelto   - CTA this admission did not show PE.  3. Tobacco abuse   CP resolved.  BP soft and mildly orthostatic. Cut Imdur to 30 and d/c lisinopril.    Will need ongoing anti-coagulation for previous DVT/PE. Given clot burden in D1 I feel he needs to continue Effient for at least 1 month (rather than Plavix). With increased risk of bleeding with triple therapy we plan to treat with Effient + Eliquis (Eliquis begun today). Drop ASA. (  WOEST trial). Also Amplify-EXT shows apixaban 2.5 bid is equivalent to full dose for preventing recurrent DVT/PE in patient who have already been treated for 6 months or more.   Can go to tele. Needs go to CR.   Kynlea Blackston,MD 3:45 PM

## 2013-10-24 NOTE — Progress Notes (Signed)
CARDIAC REHAB PHASE I   PRE:  Rate/Rhythm: 60 SR  BP:  Supine:   Sitting: 106/66, 105/64  Standing:    SaO2:   MODE:  Ambulation: 700 ft   POST:  Rate/Rhythm: 85 SR  BP:  Supine:   Sitting:   Standing: 88/51   SaO2:  1035-114 Completed diet ed with pt who voiced understanding but overwhelmed by changes he needs to make. Pt c/o feeling lightheaded when walking but no CP. BP decreased after walk as documented. Assisted to bed with call bell.   Graylon Good, RN BSN  10/24/2013 11:10 AM

## 2013-10-24 NOTE — Progress Notes (Signed)
[facility]  [name] QIW:[id number] DOB: 1960/07/25 DOA: 10/20/2013 PCP: [name], MD   Brief narrative: 53 y.o. BM PMHx recurrent DVT/PE on Xarelto, asthma, tobacco abuse, alcohol use who  presented to the ER with chest pressure. 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 This was associated with sweating and some nausea. EMS was called and he was brought to the ER. En route he was given 3 sublingual nitroglycerin with only minimal benefit.   In the ER EKG, troponin x1 unremarkable. Underwent a CTA chest which was negative for PE. He was still experiencing CP and due to multiple risk factors for CAD he was admitted  HPI/Subjective: Sitting up in chair. Still having chest pain that occurs at rest and with activity although less than has been over the previous days. No definitive GI related chest discomfort.  Assessment/Plan:    CAD/NSTEMI (non-ST elevated myocardial infarction)-post PCI/BMS -Cards consulting -subsequently taken to the cath lab 7/28: radial approach-severe 3V CAD s/p PTCA proximal and mid D1 but Cards also documents post intervention with sluggish flow -started on Effient and ASA x 1 month (due to clot burden in vessel) then OK to convert to Plavix alone in setting of preadmit Xarelto per Cards -due to increased bleeding risk with triple therapy (ASA, Eliquis and Effient) Cards recommended to use lower dose of Effient and decrease ASA dose -per CM: given 30 day free card for the Effient and since on preferred list for Medicaid co pay thereafter will be $3 -Cards transitioned IV NTG to Imdur -cont Beta blocker and statin-ACE added 7/29 -mildly orthostatic 7/31 so Cards so Imdur decreased to 30 and Lisinopril dc'd -Cards kept the patient in the SDU due to ongoing CP 4/17   Mild systolic dysfunction -EF  45% on cath -ACE added this admit   Uncontrolled HTN -as above   Epigastric pain -seemed to have a GI component as well as a cardiac component but at this point suspect more cardiac etiology -began PPI 7/29 with some improvement in sx's -Lipase normal -checked serum H Pylori  ISR <0.40 and IgA 5 so doubt H pylori   ETOH abuse  -actually drinks sporadically and primarily <3 beers while watching sports therefore DOES NOT fit criteria for abuse (averages 3-5 drinks per WEEK at max)    COPD  -compensated    Personal history of pulmonary embolism (2014)/  History of DVT -recurrent DVT documented > lifelong Xarelto    Tobacco abuse -Patient counseled on need to absolutely stop smoking now. -Prior to discharge plan to discuss treatment options (Wellbutrin vs nicotine patch)      DVT prophylaxis: IV heparin Code Status: Full Family Communication: Patient only no family at bedside Disposition Plan/Expected LOS: OK per CARDS TO [place]  Consultants: Cardiology  Procedures: Left heart catheterization  7/28  Cultures: None  Antibiotics: None  Objective: Blood pressure 88/51, pulse 67, temperature 97.8 F (36.6 C), temperature source Oral, resp. rate 20, height 6\' 2"  (1.88 m), weight 166 lb 14.2 oz (75.7 kg), SpO2 99.00%. No intake or output data in the 24 hours ending 10/24/13 1520 Exam: Gen: No acute respiratory distress Chest: Clear to auscultation bilaterally, room air Cardiac: Regular rate and rhythm, S1-S2, no rubs murmurs or gallops, no peripheral edema Abdomen: Soft nontender nondistended without obvious hepatosplenomegaly, no ascites Extremities: Symmetrical in appearance without cyanosis,  clubbing or effusion  Scheduled Meds:  Scheduled Meds: . apixaban  2.5 mg Oral BID  . atorvastatin  80 mg Oral q1800  . isosorbide mononitrate  60 mg Oral Daily  . lisinopril  5 mg Oral Daily  . metoprolol tartrate  25 mg Oral BID  . mometasone-formoterol  2 puff  Inhalation BID  . pantoprazole  40 mg Oral BID  . prasugrel  10 mg Oral Daily   Continuous Infusions: . sodium chloride 10 mL (10/22/13 0301)  . sodium chloride 10 mL/hr at 10/22/13 1900    Data Reviewed: Basic Metabolic Panel:  Recent Labs Lab 10/20/13 2245 10/21/13 0537 10/22/13 0900 10/23/13 0326 10/24/13 0251  NA 141 138 140 139 140  K 3.9 3.7 4.3 4.0 4.3  CL 104 104 104 105 103  CO2 19 21 23 22 25   GLUCOSE 121* 128* 112* 99 105*  BUN 15 12 11 6 7   CREATININE 1.21 0.89 0.69 0.63 0.78  CALCIUM 10.1 9.0 8.6 8.5 8.9   Liver Function Tests:  Recent Labs Lab 10/20/13 2245 10/21/13 0537  AST 39* 41*  ALT 26 22  ALKPHOS 93 80  BILITOT 0.5 0.4  PROT 8.0 6.8  ALBUMIN 3.9 3.2*    Recent Labs Lab 10/21/13 0200 10/22/13 1250  LIPASE 15 20   CBC:  Recent Labs Lab 10/20/13 2245 10/21/13 0537 10/21/13 1900 10/22/13 0900 10/23/13 0326 10/24/13 0251  WBC 5.1 4.9  --  4.5 3.2* 3.5*  NEUTROABS 2.7  --   --   --   --   --   HGB 14.7 12.2*  --  10.9* 10.4* 10.6*  HCT 43.5 36.7*  --  33.1* 31.5* 32.0*  MCV 95.6 95.1  --  95.7 94.9 94.4  PLT 158 146* 277 135* 121* 130*   Cardiac Enzymes:  Recent Labs Lab 10/21/13 0537 10/21/13 0956 10/21/13 1545 10/22/13 1250 10/23/13 1103  TROPONINI 2.24* 4.06* 7.06* 4.23* 2.60*    Recent Results (from the past 240 hour(s))  MRSA PCR SCREENING     Status: None   Collection Time    10/21/13  4:43 AM      Result Value Ref Range Status   MRSA by PCR NEGATIVE  NEGATIVE Final   Comment:            The GeneXpert MRSA Assay (FDA     approved for NASAL specimens     only), is one component of a     comprehensive MRSA colonization     surveillance program. It is not     intended to diagnose MRSA     infection nor to guide or     monitor treatment for     MRSA infections.     Studies:  Recent x-ray studies have been reviewed in detail by the Attending Physician  Time spent :  [place],  ANP [facility] Office  [phone] Pager [phone]   **If unable to reach the above provider after paging please contact the [place] @ [phone]  On-Call/Text Page:      [email or web address]      password [email or web address]  If 7PM-7AM, please contact night-coverage [email or web address] Password [email or web address] 10/24/2013, 3:20 PM   LOS: 4 days    Examined patient with ANP Ebony Hail, discussed assessment and plan and agree with the above plan  patient with multiple complex medical issues> 40 min on direct patient care

## 2013-10-25 MED ORDER — METOPROLOL TARTRATE 12.5 MG HALF TABLET
12.5000 mg | ORAL_TABLET | Freq: Two times a day (BID) | ORAL | Status: DC
  Administered 2013-10-26 – 2013-10-27 (×3): 12.5 mg via ORAL
  Filled 2013-10-25 (×5): qty 1

## 2013-10-25 MED ORDER — SODIUM CHLORIDE 0.9 % IV BOLUS (SEPSIS)
250.0000 mL | Freq: Once | INTRAVENOUS | Status: AC
  Administered 2013-10-25: 250 mL via INTRAVENOUS

## 2013-10-25 NOTE — Progress Notes (Signed)
Patient Demographics  [name], is a 53 y.o. male, DOB - 12-Mar-1961, RCV:[id number]  Admit date - 10/20/2013   Admitting Physician Domenic Polite, MD  Outpatient Primary MD for the patient is [name], MD  LOS - 5   Chief Complaint  Patient presents with  . Chest Pain      Brief narrative:   53 y.o. BM PMHx recurrent DVT/PE on Xarelto, asthma, tobacco abuse, alcohol use who presented to the ER with chest pressure. 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 This was associated with sweating and some nausea. EMS was called and he was brought to the ER. En route he was given 3 sublingual nitroglycerin with only minimal benefit.   In the ER EKG, troponin x1 unremarkable. Underwent a CTA chest which was negative for PE. He was still experiencing CP and due to multiple risk factors for CAD he was admitted     Subjective:   [name] today has, No headache, No chest pain, No abdominal pain - No Nausea, No new weakness tingling or numbness, No Cough - SOB.    Assessment & Plan    1.NSTEMI - status post left heart cath and PCI D1 with BMS with incomplete revascularization  EF 55% by echo 45-50% by cath - currently chest pain-free, on Effient and Eliquis but no aspirin per cardiology, continue beta blocker, statin and Imdur,  ncrease activity and monitor.   2. Chronic systolic CHF EF around 10-25%. Compensated from the standpoint, on Imdur and beta blocker, blood pressure too low to tolerate ACE/ARB.   3. GERD mild epigastric abdominal pain. Pain completely resolved, Continue PPI    4. History of recurrent DVT PE. On Eliquis dosed by cardiology and  pharmacy.    5. COPD at baseline no wheezing. Supportive care only at this time.    6. Tobacco abuse and intermittent alcohol abuse. Counseled to quit. No signs of DTs.    7. Hypertension. Blood pressure soft continue present dose beta blocker and Imdur and monitor.     Code Status: Full  Family Communication: none present  Disposition Plan: Home   Procedures left heart catheterization 10/21/2013  POST-OPERATIVE DIAGNOSIS:   Severe single branch vessel with moderate to severe additional 3 Vessel CAD:  100% D1 @ 40-50% eccentric Mid Wraparound LAD (TIMI 2 flow in LAD)  Large Ramus - proximal ~60-70%, non-flow limiting  Moderate prox & mid RCA (dominant) 40% & 50-60% Borderline Successful revascularization of the proximal & mid D1 - Moderate Caliber 100% occluded D1 - reduced to 0% but only TIMI 2 flow in distal vessel with occluded terminal branch -- a 2.25 mm x 28 MultiLink Vision BMS was deployed to 2.5 mm. Mildly reduced LVEF ~45-50% with Anterolateral Hypokinesis with moderately elevated LVEDP    PLAN OF CARE:   Post Radial Cath Care - TR Band 15 mL air, 1220  IV Aggrestat x 12 hrs, restart IV Heparin 6 hr post TR band removal (run until tomorrow's scheduled Xarelto  DAPT - Effient + ASA x 1 month then OK to convert to Plavix alone in setting of Xarelto 2d Echo  Would monitor @ least  2 days post cath.  If recurrent angina - consider FFR vs. IVUS guided PCI of mid LAd vs. Ramus Intermedius.    Leonie Man, M.D., M.S.  [facility]  [address] [place], [place] [address]  [phone]    Consults  Cards   Medications  Scheduled Meds: . apixaban  2.5 mg Oral BID  . atorvastatin  80 mg Oral q1800  . isosorbide mononitrate  30 mg Oral Daily  . metoprolol tartrate  25 mg Oral BID  . mometasone-formoterol  2 puff Inhalation BID  . pantoprazole  40 mg Oral BID  . prasugrel  10 mg Oral Daily   Continuous  Infusions: . sodium chloride Stopped (10/24/13 0756)   PRN Meds:.acetaminophen, albuterol, ALPRAZolam, morphine injection, nitroGLYCERIN, ondansetron (ZOFRAN) IV, oxyCODONE-acetaminophen  DVT Prophylaxis  Eliquis  Lab Results  Component Value Date   PLT 130* 10/24/2013    Antibiotics     Anti-infectives   None          Objective:   Filed Vitals:   10/24/13 1731 10/24/13 2017 10/24/13 2117 10/25/13 0535  BP: 95/52  100/58 103/66  Pulse: 59  61 51  Temp: 98.2 F (36.8 C)  97.9 F (36.6 C) 97.5 F (36.4 C)  TempSrc: Oral  Oral Oral  Resp: 17  16 16   Height:      Weight:      SpO2: 99% 96% 98% 99%    Wt Readings from Last 3 Encounters:  10/21/13 75.7 kg (166 lb 14.2 oz)  10/21/13 75.7 kg (166 lb 14.2 oz)  10/21/13 75.7 kg (166 lb 14.2 oz)     Intake/Output Summary (Last 24 hours) at 10/25/13 1031 Last data filed at 10/25/13 0744  Gross per 24 hour  Intake    240 ml  Output      0 ml  Net    240 ml     Physical Exam  Awake Alert, Oriented X 3, No new F.N deficits, Normal affect [place].AT,PERRAL Supple Neck,No JVD, No cervical lymphadenopathy appriciated.  Symmetrical Chest wall movement, Good air movement bilaterally, CTAB RRR,No Gallops,Rubs or new Murmurs, No Parasternal Heave +ve B.Sounds, Abd Soft, No tenderness, No organomegaly appriciated, No rebound - guarding or rigidity. No Cyanosis, Clubbing or edema, No new Rash or bruise     Data Review   Micro Results Recent Results (from the past 240 hour(s))  MRSA PCR SCREENING     Status: None   Collection Time    10/21/13  4:43 AM      Result Value Ref Range Status   MRSA by PCR NEGATIVE  NEGATIVE Final   Comment:            The GeneXpert MRSA Assay (FDA     approved for NASAL specimens     only), is one component of a     comprehensive MRSA colonization     surveillance program. It is not     intended to diagnose MRSA     infection nor to guide or     monitor treatment for     MRSA  infections.    Radiology Reports Dg Chest 2 View  10/20/2013   CLINICAL DATA:  Centralized chest pain, cough, congestion, and sneezing. Thirty year smoker.  EXAM: CHEST  2 VIEW  COMPARISON:  06/18/2013  FINDINGS: Normal heart size and pulmonary vascularity. Mild hyperinflation and central interstitial changes suggesting emphysema and chronic bronchitis. No focal airspace disease or consolidation in  the lungs. No blunting of costophrenic angles. No pneumothorax. Degenerative changes in the thoracic spine.  IMPRESSION: Emphysematous changes and chronic bronchitic changes in the lungs. No evidence of active pulmonary disease.   Electronically Signed   By: Lucienne Capers M.D.   On: 10/20/2013 23:35   Ct Angio Chest Pe W/cm &/or Wo Cm  10/21/2013   CLINICAL DATA:  Sudden onset midsternal chest pain and shortness of breath. History of DVT.  EXAM: CT ANGIOGRAPHY CHEST WITH CONTRAST  TECHNIQUE: Multidetector CT imaging of the chest was performed using the standard protocol during bolus administration of intravenous contrast. Multiplanar CT image reconstructions and MIPs were obtained to evaluate the vascular anatomy.  CONTRAST:  100 mL Omnipaque 350  COMPARISON:  08/08/2012  FINDINGS: Technically adequate study with good opacification of the central and segmental pulmonary arteries. No focal filling defects are demonstrated. No evidence of significant pulmonary embolus.  Normal heart size. Normal caliber thoracic aorta. Calcification in the coronary arteries. Esophagus is mostly decompressed. No significant lymphadenopathy in the chest. Emphysematous changes and scattered interstitial fibrosis in the lungs. Basilar dependent atelectasis. Bronchial wall thickening with peribronchial infiltration consistent with chronic bronchitis/ bronchiolitis. No focal airspace disease or consolidation in the lungs. No pneumothorax. No effusion.  Visualized portions of the upper abdominal organs are grossly unremarkable.  Degenerative changes in the thoracic spine. No destructive bone lesions appreciated.  Review of the MIP images confirms the above findings.  IMPRESSION: No evidence of significant pulmonary embolus. Emphysematous and bronchitic changes in the lungs.   Electronically Signed   By: Lucienne Capers M.D.   On: 10/21/2013 00:25   US Abdomen Complete  10/21/2013   CLINICAL DATA:  Abdominal pain, nausea and vomiting.  EXAM: ULTRASOUND ABDOMEN COMPLETE  COMPARISON:  None.  FINDINGS: Gallbladder:  No gallstones or wall thickening visualized. No sonographic Murphy sign noted.  Common bile duct:  Diameter: 3.8 mm, normal  Liver:  No focal lesion identified. Within normal limits in parenchymal echogenicity.  IVC:  No abnormality visualized.  Pancreas:  Visualized portion unremarkable.  Spleen:  Size and appearance within normal limits.  Right Kidney:  Length: 11.1 cm. Echogenicity within normal limits. No mass or hydronephrosis visualized.  Left Kidney:  Length: 12.2 cm. Echogenicity within normal limits. No mass or hydronephrosis visualized.  Abdominal aorta:  Limited visualization.  Overlying bowel gas.  Other findings:  None.  IMPRESSION: No acute abnormalities demonstrated.   Electronically Signed   By: Lucienne Capers M.D.   On: 10/21/2013 03:20    CBC  Recent Labs Lab 10/20/13 2245 10/21/13 0537 10/21/13 1900 10/22/13 0900 10/23/13 0326 10/24/13 0251  WBC 5.1 4.9  --  4.5 3.2* 3.5*  HGB 14.7 12.2*  --  10.9* 10.4* 10.6*  HCT 43.5 36.7*  --  33.1* 31.5* 32.0*  PLT 158 146* 277 135* 121* 130*  MCV 95.6 95.1  --  95.7 94.9 94.4  MCH 32.3 31.6  --  31.5 31.3 31.3  MCHC 33.8 33.2  --  32.9 33.0 33.1  RDW 14.3 14.4  --  14.3 14.4 14.1  LYMPHSABS 1.7  --   --   --   --   --   MONOABS 0.5  --   --   --   --   --   EOSABS 0.3  --   --   --   --   --   BASOSABS 0.0  --   --   --   --   --  Chemistries   Recent Labs Lab 10/20/13 2245 10/21/13 0537 10/22/13 0900 10/23/13 0326 10/24/13 0251   NA 141 138 140 139 140  K 3.9 3.7 4.3 4.0 4.3  CL 104 104 104 105 103  CO2 19 21 23 22 25   GLUCOSE 121* 128* 112* 99 105*  BUN 15 12 11 6 7   CREATININE 1.21 0.89 0.69 0.63 0.78  CALCIUM 10.1 9.0 8.6 8.5 8.9  AST 39* 41*  --   --   --   ALT 26 22  --   --   --   ALKPHOS 93 80  --   --   --   BILITOT 0.5 0.4  --   --   --    ------------------------------------------------------------------------------------------------------------------ estimated creatinine clearance is 114.3 ml/min (by C-G formula based on Cr of 0.78). ------------------------------------------------------------------------------------------------------------------ No results found for this basename: HGBA1C,  in the last 72 hours ------------------------------------------------------------------------------------------------------------------ No results found for this basename: CHOL, HDL, LDLCALC, TRIG, CHOLHDL, LDLDIRECT,  in the last 72 hours ------------------------------------------------------------------------------------------------------------------ No results found for this basename: TSH, T4TOTAL, FREET3, T3FREE, THYROIDAB,  in the last 72 hours ------------------------------------------------------------------------------------------------------------------ No results found for this basename: VITAMINB12, FOLATE, FERRITIN, TIBC, IRON, RETICCTPCT,  in the last 72 hours  Coagulation profile No results found for this basename: INR, PROTIME,  in the last 168 hours  No results found for this basename: DDIMER,  in the last 72 hours  Cardiac Enzymes  Recent Labs Lab 10/21/13 1545 10/22/13 1250 10/23/13 1103  TROPONINI 7.06* 4.23* 2.60*   ------------------------------------------------------------------------------------------------------------------ No components found with this basename: POCBNP,      Time Spent in minutes  35   Javaya [place] K M.D on 10/25/2013 at 10:31 AM  Between 7am to 7pm -  Pager - [phone]  After 7pm go to [email or web address] - password [email or web address]  And look for the night coverage person covering for me after hours  [facility] Office  [phone]   **Disclaimer: This note may have been dictated with voice recognition software. Similar sounding words can inadvertently be transcribed and this note may contain transcription errors which may not have been corrected upon publication of note.**

## 2013-10-25 NOTE — Progress Notes (Signed)
Patient ID: [name], male   DOB: Jan 03, 1961, 53 y.o.   MRN: [id number]   SUBJECTIVE:   6/10 chest pain this am at rest, then walked with PT with no pain, feeling well right now.   Scheduled Meds: . apixaban  2.5 mg Oral BID  . atorvastatin  80 mg Oral q1800  . isosorbide mononitrate  30 mg Oral Daily  . metoprolol tartrate  25 mg Oral BID  . mometasone-formoterol  2 puff Inhalation BID  . pantoprazole  40 mg Oral BID  . prasugrel  10 mg Oral Daily   Continuous Infusions: . sodium chloride Stopped (10/24/13 0756)   PRN Meds:.acetaminophen, albuterol, ALPRAZolam, morphine injection, nitroGLYCERIN, ondansetron (ZOFRAN) IV, oxyCODONE-acetaminophen  Filed Vitals:   10/24/13 1731 10/24/13 2017 10/24/13 2117 10/25/13 0535  BP: 95/52  100/58 103/66  Pulse: 59  61 51  Temp: 98.2 F (36.8 C)  97.9 F (36.6 C) 97.5 F (36.4 C)  TempSrc: Oral  Oral Oral  Resp: 17  16 16   Height:      Weight:      SpO2: 99% 96% 98% 99%    Intake/Output Summary (Last 24 hours) at 10/25/13 1006 Last data filed at 10/25/13 0744  Gross per 24 hour  Intake    240 ml  Output      0 ml  Net    240 ml    LABS: Basic Metabolic Panel:  Recent Labs  10/23/13 0326 10/24/13 0251  NA 139 140  K 4.0 4.3  CL 105 103  CO2 22 25  GLUCOSE 99 105*  BUN 6 7  CREATININE 0.63 0.78  CALCIUM 8.5 8.9   Liver Function Tests: No results found for this basename: AST, ALT, ALKPHOS, BILITOT, PROT, ALBUMIN,  in the last 72 hours  Recent Labs  10/22/13 1250  LIPASE 20   CBC:  Recent Labs  10/23/13 0326 10/24/13 0251  WBC 3.2* 3.5*  HGB 10.4* 10.6*  HCT 31.5* 32.0*  MCV 94.9 94.4  PLT 121* 130*   Cardiac Enzymes:  Recent Labs  10/22/13 1250 10/23/13 1103  TROPONINI 4.23* 2.60*   Dg Chest 2 View  10/20/2013   CLINICAL DATA:  Centralized chest pain, cough, congestion, and sneezing. Thirty year smoker.  EXAM: CHEST  2 VIEW  COMPARISON:  06/18/2013  FINDINGS: Normal heart size and  pulmonary vascularity. Mild hyperinflation and central interstitial changes suggesting emphysema and chronic bronchitis. No focal airspace disease or consolidation in the lungs. No blunting of costophrenic angles. No pneumothorax. Degenerative changes in the thoracic spine.  IMPRESSION: Emphysematous changes and chronic bronchitic changes in the lungs. No evidence of active pulmonary disease.   Electronically Signed   By: Lucienne Capers M.D.   On: 10/20/2013 23:35   Ct Angio Chest Pe W/cm &/or Wo Cm  10/21/2013   CLINICAL DATA:  Sudden onset midsternal chest pain and shortness of breath. History of DVT.   IMPRESSION: No evidence of significant pulmonary embolus. Emphysematous and bronchitic changes in the lungs.   Electronically Signed   By: Lucienne Capers M.D.   On:   PHYSICAL EXAM General: NAD Neck: No JVD, no thyromegaly or thyroid nodule.  Lungs: Clear to auscultation bilaterally with normal respiratory effort. CV: Nondisplaced PMI.  Heart regular S1/S2, no S3/S4, no murmur.  No peripheral edema.  Abdomen: Soft, nontender, no hepatosplenomegaly, no distention.  Neurologic: Alert and oriented x 3.  Psych: Normal affect. Extremities: No clubbing or cyanosis.   TELEMETRY: Reviewed telemetry pt in  NSR  ASSESSMENT AND PLAN:  53 yo smoker with history of multiple DVTs and PEs presented with chest pain and elevated troponin.   1. NSTEMI with an episode of chest pain this morning.  - s/p PCI D1 with BMS with incomplete revascularization - EF 55% by echo 45-50% by cath 2. H/o PE/DVT in 2014 - Has IVC filter but not totally compliant with Xarelto   - CTA this admission did not show PE.  3. Tobacco abuse   Underwent cath Tuesday with occluded D1 and otherwise moderate non-obstructive CAD. EF 45-50%. Had PCI of D1 with BMS but still with sluggish flow in D1 and distal LAD.   Troponin trending down on 7/30. Will need ongoing anti-coagulation for previous DVT/PE. Given clot burden in D1 I feel  he needs to continue Effient for at least 1 month (rather than Plavix). With increased risk of bleeding with triple therapy we plan to treat with Effient + Eliquis (Eliquis begun today). Drop ASA. (WOEST trial). Also Amplify-EXT shows apixaban 2.5 bid is equivalent to full dose for preventing recurrent DVT/PE in patient who have already been treated for 6 months or more.   BP borderline, but no orthostasis anymore. Hold lisinopril, continue lower dose of Imdur.  Plan on discharging tomorrow if stable. Encouraged to walk.   [name] H,MD 10:06 AM

## 2013-10-25 NOTE — Evaluation (Signed)
Physical Therapy Evaluation Patient Details Name: [name] MRN: [id number] DOB: 12-27-1960 Today's Date: 10/25/2013   History of Present Illness  Oluwatobi Visser is a 53 y.o. male history of DVT/PE on Xarelto, asthma, tobacco abuse, alcohol use presents to the ER 7/28  with the above complaints. 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  Clinical Impression  Pt functioning at a supervision level however still experiencing dizziness/whooziness with mobility. Tested for posterior BPPV via dix-hallpike maneuver however neg for nystagmus and pt asymptomatic. Pt BP s/p amb was 97/61 with HR of 49. RN made aware. Suspect pt will progress well enough from mobility stand point to d/c home once medically stable. Acute PT to follow.    Follow Up Recommendations No PT follow up;Supervision - Intermittent    Equipment Recommendations  None recommended by PT    Recommendations for Other Services       Precautions / Restrictions Precautions Precautions: Fall Restrictions Weight Bearing Restrictions: No      Mobility  Bed Mobility Overal bed mobility: Modified Independent             General bed mobility comments: pt used UEs but demo'd safe technique  Transfers Overall transfer level: Needs assistance Equipment used: None Transfers: Sit to/from Stand Sit to Stand: Supervision         General transfer comment: supervision due to reported "whooziness"  Ambulation/Gait Ambulation/Gait assistance: Supervision Ambulation Distance (Feet): 500 Feet Assistive device: None Gait Pattern/deviations: Step-through pattern Gait velocity: decreased   General Gait Details: pt with no episodes of LOB but occasionally unsteady during an episode of "whooziness"  Stairs Stairs: Yes Stairs assistance: Min guard Stair Management: One rail  Right Number of Stairs: 10 General stair comments: pt required to hold onto hand rail  Wheelchair Mobility    Modified Rankin (Stroke Patients Only)       Balance                                 Standardized Balance Assessment Standardized Balance Assessment : Dynamic Gait Index   Dynamic Gait Index Level Surface: Normal Change in Gait Speed: Normal Gait with Horizontal Head Turns: Normal Gait with Vertical Head Turns: Normal Gait and Pivot Turn: Normal Step Over Obstacle: Mild Impairment Step Around Obstacles: Mild Impairment Steps: Mild Impairment Total Score: 21       Pertinent Vitals/Pain Denies pain    Home Living Family/patient expects to be discharged to:: Private residence Living Arrangements: Non-relatives/Friends Available Help at Discharge: Family;Available 24 hours/day Type of Home: House Home Access: Stairs to enter Entrance Stairs-Rails: None Entrance Stairs-Number of Steps: 5 Home Layout: One level Home Equipment: None      Prior Function Level of Independence: Independent         Comments: volunteers at the [employer]     Hand Dominance   Dominant Hand: Right    Extremity/Trunk Assessment   Upper Extremity Assessment: Overall WFL for tasks assessed           Lower Extremity Assessment: Overall WFL for tasks assessed      Cervical / Trunk Assessment: Normal  Communication   Communication: No difficulties  Cognition Arousal/Alertness: Awake/alert Behavior During Therapy: WFL for tasks assessed/performed Overall Cognitive Status: Within Functional Limits for tasks assessed  General Comments      Exercises        Assessment/Plan    PT Assessment Patient needs continued PT services  PT Diagnosis Generalized weakness   PT Problem List Decreased activity tolerance;Decreased balance;Decreased strength;Decreased mobility  PT Treatment Interventions DME instruction;Gait  training;Stair training;Functional mobility training;Therapeutic activities;Therapeutic exercise   PT Goals (Current goals can be found in the Care Plan section) Acute Rehab PT Goals Patient Stated Goal: stop this "whooziness" PT Goal Formulation: With patient Time For Goal Achievement: 11/01/13 Potential to Achieve Goals: Good Additional Goals Additional Goal #1: Pt to score > 52 on Berg Balance Test to indiciate minimal falls risk.    Frequency Min 2X/week   Barriers to discharge        Co-evaluation               End of Session Equipment Utilized During Treatment: Gait belt Activity Tolerance: Patient tolerated treatment well Patient left: in bed;with call bell/phone within reach Nurse Communication: Mobility status (low BP after ambulation)         Time: 8921-1941 PT Time Calculation (min): 25 min   Charges:   PT Evaluation $Initial PT Evaluation Tier I: 1 Procedure PT Treatments $Gait Training: 8-22 mins   PT G CodesKingsley Callander 10/25/2013, 3:14 PM   Kittie Plater, PT, DPT Pager #: [phone] Office #: [phone]

## 2013-10-25 NOTE — Progress Notes (Signed)
CARDIAC REHAB PHASE I   PRE:  Rate/Rhythm: 43 SR  BP:  Sitting: 90/64 asymptomatic    MODE:  Ambulation: 700 ft   POST:  Rate/Rhythm: 71 SR  BP:  Sitting: 94/60  Pt walked 700 ft independently with no SOB or CP.  Pt c/o mild dizziness with ambulation.  Returned pt to room and bed.  Pt dizziness resolved with sitting and BP stable.  Left pt with phone and call button in reach. [phone]  Lillia Dallas MS, ACSM RCEP 10/25/2013

## 2013-10-26 DIAGNOSIS — Z9861 Coronary angioplasty status: Secondary | ICD-10-CM

## 2013-10-26 MED ORDER — LORATADINE 10 MG PO TABS
10.0000 mg | ORAL_TABLET | Freq: Every day | ORAL | Status: DC
  Administered 2013-10-26 – 2013-10-27 (×2): 10 mg via ORAL
  Filled 2013-10-26 (×2): qty 1

## 2013-10-26 NOTE — Progress Notes (Signed)
Patient Demographics  [name], is a 53 y.o. male, DOB - May 22, 1960, TDV:[id number]  Admit date - 10/20/2013   Admitting Physician Domenic Polite, MD  Outpatient Primary MD for the patient is [name], MD  LOS - 6   Chief Complaint  Patient presents with  . Chest Pain      Brief narrative:   53 y.o. BM PMHx recurrent DVT/PE on Xarelto, asthma, tobacco abuse, alcohol use who presented to the ER with chest pressure. 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 This was associated with sweating and some nausea. EMS was called and he was brought to the ER. En route he was given 3 sublingual nitroglycerin with only minimal benefit.   In the ER EKG, troponin x1 unremarkable. Underwent a CTA chest which was negative for PE. He was still experiencing CP and due to multiple risk factors for CAD he was admitted     Subjective:   [name] today has, No headache, No chest pain, No abdominal pain - No Nausea, No new weakness tingling or numbness, No Cough - SOB.    Assessment & Plan    1.NSTEMI - status post left heart cath and PCI D1 with BMS with incomplete revascularization  EF 55% by echo 45-50% by cath - currently chest pain-free, on Effient and Eliquis but no aspirin per cardiology, continue beta blocker, statin and Imdur,  ncrease activity and monitor.    2. Chronic systolic CHF EF around 06-26%. Compensated from the standpoint, on Imdur and beta blocker, blood pressure too low to tolerate ACE/ARB.    3. GERD mild epigastric abdominal pain. Pain completely resolved, Continue PPI    4. History of recurrent DVT PE. On Eliquis dosed by cardiology and  pharmacy.    5. COPD at baseline no wheezing. Supportive care only at this time.    6. Tobacco abuse and intermittent alcohol abuse. Counseled to quit. No signs of DTs.    7. Hypertension. Blood pressure soft continue present dose beta blocker and Imdur and monitor. Note is chronically dizzy upon standing up, we'll give him a trial of TED stockings, have him sit in chair and daytime and Ambien multiple times in the hallway.     Code Status: Full  Family Communication: none present  Disposition Plan: Home   Procedures left heart catheterization 10/21/2013  POST-OPERATIVE DIAGNOSIS:   Severe single branch vessel with moderate to severe additional 3 Vessel CAD:  100% D1 @ 40-50% eccentric Mid Wraparound LAD (TIMI 2 flow in LAD)  Large Ramus - proximal ~60-70%, non-flow limiting  Moderate prox & mid RCA (dominant) 40% & 50-60% Borderline Successful revascularization of the proximal & mid D1 - Moderate Caliber 100% occluded D1 - reduced to 0% but only TIMI 2 flow in distal vessel with occluded terminal branch -- a 2.25 mm x 28 MultiLink Vision BMS was deployed to 2.5 mm. Mildly reduced LVEF ~45-50% with Anterolateral Hypokinesis with moderately elevated LVEDP    PLAN OF CARE:   Post Radial Cath Care - TR Band 15 mL air, 1220  IV Aggrestat x 12 hrs, restart IV Heparin 6 hr post TR band removal (run  until tomorrow's scheduled Xarelto  DAPT - Effient + ASA x 1 month then OK to convert to Plavix alone in setting of Xarelto 2d Echo  Would monitor @ least 2 days post cath.  If recurrent angina - consider FFR vs. IVUS guided PCI of mid LAd vs. Ramus Intermedius.    Leonie Man, M.D., M.S.  [facility]  [address] [place], [place] [address]  [phone]    Consults  Cards   Medications  Scheduled Meds: . apixaban  2.5 mg Oral BID  . atorvastatin  80 mg Oral q1800  . isosorbide mononitrate  30 mg Oral Daily  . loratadine  10  mg Oral Daily  . metoprolol tartrate  12.5 mg Oral BID  . mometasone-formoterol  2 puff Inhalation BID  . pantoprazole  40 mg Oral BID  . prasugrel  10 mg Oral Daily   Continuous Infusions: . sodium chloride Stopped (10/24/13 0756)   PRN Meds:.acetaminophen, albuterol, ALPRAZolam, morphine injection, nitroGLYCERIN, ondansetron (ZOFRAN) IV, oxyCODONE-acetaminophen  DVT Prophylaxis  Eliquis  Lab Results  Component Value Date   PLT 130* 10/24/2013    Antibiotics     Anti-infectives   None          Objective:   Filed Vitals:   10/25/13 2100 10/25/13 2102 10/25/13 2115 10/26/13 0439  BP: 100/51 100/61  104/64  Pulse: 52 47 54 59  Temp:  98 F (36.7 C)  97.7 F (36.5 C)  TempSrc:  Oral  Oral  Resp: 18 18 18 18   Height:      Weight:      SpO2: 100% 98%      Wt Readings from Last 3 Encounters:  10/21/13 75.7 kg (166 lb 14.2 oz)  10/21/13 75.7 kg (166 lb 14.2 oz)  10/21/13 75.7 kg (166 lb 14.2 oz)     Intake/Output Summary (Last 24 hours) at 10/26/13 0915 Last data filed at 10/25/13 1853  Gross per 24 hour  Intake    480 ml  Output      0 ml  Net    480 ml     Physical Exam  Awake Alert, Oriented X 3, No new F.N deficits, Normal affect [place].AT,PERRAL Supple Neck,No JVD, No cervical lymphadenopathy appriciated.  Symmetrical Chest wall movement, Good air movement bilaterally, CTAB RRR,No Gallops,Rubs or new Murmurs, No Parasternal Heave +ve B.Sounds, Abd Soft, No tenderness, No organomegaly appriciated, No rebound - guarding or rigidity. No Cyanosis, Clubbing or edema, No new Rash or bruise     Data Review   Micro Results Recent Results (from the past 240 hour(s))  MRSA PCR SCREENING     Status: None   Collection Time    10/21/13  4:43 AM      Result Value Ref Range Status   MRSA by PCR NEGATIVE  NEGATIVE Final   Comment:            The GeneXpert MRSA Assay (FDA     approved for NASAL specimens     only), is one component of a     comprehensive  MRSA colonization     surveillance program. It is not     intended to diagnose MRSA     infection nor to guide or     monitor treatment for     MRSA infections.    Radiology Reports Dg Chest 2 View  10/20/2013   CLINICAL DATA:  Centralized chest pain, cough, congestion, and sneezing. Thirty year smoker.  EXAM: CHEST  2 VIEW  COMPARISON:  06/18/2013  FINDINGS: Normal heart size and pulmonary vascularity. Mild hyperinflation and central interstitial changes suggesting emphysema and chronic bronchitis. No focal airspace disease or consolidation in the lungs. No blunting of costophrenic angles. No pneumothorax. Degenerative changes in the thoracic spine.  IMPRESSION: Emphysematous changes and chronic bronchitic changes in the lungs. No evidence of active pulmonary disease.   Electronically Signed   By: Lucienne Capers M.D.   On: 10/20/2013 23:35   Ct Angio Chest Pe W/cm &/or Wo Cm  10/21/2013   CLINICAL DATA:  Sudden onset midsternal chest pain and shortness of breath. History of DVT.  EXAM: CT ANGIOGRAPHY CHEST WITH CONTRAST  TECHNIQUE: Multidetector CT imaging of the chest was performed using the standard protocol during bolus administration of intravenous contrast. Multiplanar CT image reconstructions and MIPs were obtained to evaluate the vascular anatomy.  CONTRAST:  100 mL Omnipaque 350  COMPARISON:  08/08/2012  FINDINGS: Technically adequate study with good opacification of the central and segmental pulmonary arteries. No focal filling defects are demonstrated. No evidence of significant pulmonary embolus.  Normal heart size. Normal caliber thoracic aorta. Calcification in the coronary arteries. Esophagus is mostly decompressed. No significant lymphadenopathy in the chest. Emphysematous changes and scattered interstitial fibrosis in the lungs. Basilar dependent atelectasis. Bronchial wall thickening with peribronchial infiltration consistent with chronic bronchitis/ bronchiolitis. No focal airspace  disease or consolidation in the lungs. No pneumothorax. No effusion.  Visualized portions of the upper abdominal organs are grossly unremarkable. Degenerative changes in the thoracic spine. No destructive bone lesions appreciated.  Review of the MIP images confirms the above findings.  IMPRESSION: No evidence of significant pulmonary embolus. Emphysematous and bronchitic changes in the lungs.   Electronically Signed   By: Lucienne Capers M.D.   On: 10/21/2013 00:25   US Abdomen Complete  10/21/2013   CLINICAL DATA:  Abdominal pain, nausea and vomiting.  EXAM: ULTRASOUND ABDOMEN COMPLETE  COMPARISON:  None.  FINDINGS: Gallbladder:  No gallstones or wall thickening visualized. No sonographic Murphy sign noted.  Common bile duct:  Diameter: 3.8 mm, normal  Liver:  No focal lesion identified. Within normal limits in parenchymal echogenicity.  IVC:  No abnormality visualized.  Pancreas:  Visualized portion unremarkable.  Spleen:  Size and appearance within normal limits.  Right Kidney:  Length: 11.1 cm. Echogenicity within normal limits. No mass or hydronephrosis visualized.  Left Kidney:  Length: 12.2 cm. Echogenicity within normal limits. No mass or hydronephrosis visualized.  Abdominal aorta:  Limited visualization.  Overlying bowel gas.  Other findings:  None.  IMPRESSION: No acute abnormalities demonstrated.   Electronically Signed   By: Lucienne Capers M.D.   On: 10/21/2013 03:20    CBC  Recent Labs Lab 10/20/13 2245 10/21/13 0537 10/21/13 1900 10/22/13 0900 10/23/13 0326 10/24/13 0251  WBC 5.1 4.9  --  4.5 3.2* 3.5*  HGB 14.7 12.2*  --  10.9* 10.4* 10.6*  HCT 43.5 36.7*  --  33.1* 31.5* 32.0*  PLT 158 146* 277 135* 121* 130*  MCV 95.6 95.1  --  95.7 94.9 94.4  MCH 32.3 31.6  --  31.5 31.3 31.3  MCHC 33.8 33.2  --  32.9 33.0 33.1  RDW 14.3 14.4  --  14.3 14.4 14.1  LYMPHSABS 1.7  --   --   --   --   --   MONOABS 0.5  --   --   --   --   --  EOSABS 0.3  --   --   --   --   --   BASOSABS  0.0  --   --   --   --   --     Chemistries   Recent Labs Lab 10/20/13 2245 10/21/13 0537 10/22/13 0900 10/23/13 0326 10/24/13 0251  NA 141 138 140 139 140  K 3.9 3.7 4.3 4.0 4.3  CL 104 104 104 105 103  CO2 19 21 23 22 25   GLUCOSE 121* 128* 112* 99 105*  BUN 15 12 11 6 7   CREATININE 1.21 0.89 0.69 0.63 0.78  CALCIUM 10.1 9.0 8.6 8.5 8.9  AST 39* 41*  --   --   --   ALT 26 22  --   --   --   ALKPHOS 93 80  --   --   --   BILITOT 0.5 0.4  --   --   --    ------------------------------------------------------------------------------------------------------------------ estimated creatinine clearance is 114.3 ml/min (by C-G formula based on Cr of 0.78). ------------------------------------------------------------------------------------------------------------------ No results found for this basename: HGBA1C,  in the last 72 hours ------------------------------------------------------------------------------------------------------------------ No results found for this basename: CHOL, HDL, LDLCALC, TRIG, CHOLHDL, LDLDIRECT,  in the last 72 hours ------------------------------------------------------------------------------------------------------------------ No results found for this basename: TSH, T4TOTAL, FREET3, T3FREE, THYROIDAB,  in the last 72 hours ------------------------------------------------------------------------------------------------------------------ No results found for this basename: VITAMINB12, FOLATE, FERRITIN, TIBC, IRON, RETICCTPCT,  in the last 72 hours  Coagulation profile No results found for this basename: INR, PROTIME,  in the last 168 hours  No results found for this basename: DDIMER,  in the last 72 hours  Cardiac Enzymes  Recent Labs Lab 10/21/13 1545 10/22/13 1250 10/23/13 1103  TROPONINI 7.06* 4.23* 2.60*   ------------------------------------------------------------------------------------------------------------------ No components  found with this basename: POCBNP,      Time Spent in minutes  35   SINGH,PRASHANT K M.D on 10/26/2013 at 9:15 AM  Between 7am to 7pm - Pager - [phone]  After 7pm go to [email or web address] - password [email or web address]  And look for the night coverage person covering for me after hours  [facility] Office  [phone]   **Disclaimer: This note may have been dictated with voice recognition software. Similar sounding words can inadvertently be transcribed and this note may contain transcription errors which may not have been corrected upon publication of note.**

## 2013-10-26 NOTE — Progress Notes (Signed)
Pt ambulated 3 times in hallway today approx. 585ft each time. No s/s of distress no voiced complaints. Monitoring will continue.

## 2013-10-26 NOTE — Progress Notes (Signed)
Patient ID: [name], male   DOB: June 27, 1960, 53 y.o.   MRN: [id number]    SUBJECTIVE:  5/10 chest pain this am at rest, slightly worse while walking.   Scheduled Meds: . apixaban  2.5 mg Oral BID  . atorvastatin  80 mg Oral q1800  . isosorbide mononitrate  30 mg Oral Daily  . loratadine  10 mg Oral Daily  . metoprolol tartrate  12.5 mg Oral BID  . mometasone-formoterol  2 puff Inhalation BID  . pantoprazole  40 mg Oral BID  . prasugrel  10 mg Oral Daily   Continuous Infusions: . sodium chloride Stopped (10/24/13 0756)   PRN Meds:.acetaminophen, albuterol, ALPRAZolam, morphine injection, nitroGLYCERIN, ondansetron (ZOFRAN) IV, oxyCODONE-acetaminophen  Filed Vitals:   10/25/13 2100 10/25/13 2102 10/25/13 2115 10/26/13 0439  BP: 100/51 100/61  104/64  Pulse: 52 47 54 59  Temp:  98 F (36.7 C)  97.7 F (36.5 C)  TempSrc:  Oral  Oral  Resp: 18 18 18 18   Height:      Weight:      SpO2: 100% 98%      Intake/Output Summary (Last 24 hours) at 10/26/13 9628 Last data filed at 10/25/13 1853  Gross per 24 hour  Intake    480 ml  Output      0 ml  Net    480 ml   LABS: Basic Metabolic Panel:  Recent Labs  10/24/13 0251  NA 140  K 4.3  CL 103  CO2 25  GLUCOSE 105*  BUN 7  CREATININE 0.78  CALCIUM 8.9   Liver Function Tests: No results found for this basename: AST, ALT, ALKPHOS, BILITOT, PROT, ALBUMIN,  in the last 72 hours No results found for this basename: LIPASE, AMYLASE,  in the last 72 hours CBC:  Recent Labs  10/24/13 0251  WBC 3.5*  HGB 10.6*  HCT 32.0*  MCV 94.4  PLT 130*   Cardiac Enzymes:  Recent Labs  10/23/13 1103  TROPONINI 2.60*   Dg Chest 2 View  10/20/2013   CLINICAL DATA:  Centralized chest pain, cough, congestion, and sneezing. Thirty year smoker.  EXAM: CHEST  2 VIEW  COMPARISON:  06/18/2013  FINDINGS: Normal heart size and pulmonary vascularity. Mild hyperinflation and central interstitial changes suggesting emphysema and  chronic bronchitis. No focal airspace disease or consolidation in the lungs. No blunting of costophrenic angles. No pneumothorax. Degenerative changes in the thoracic spine.  IMPRESSION: Emphysematous changes and chronic bronchitic changes in the lungs. No evidence of active pulmonary disease.   Electronically Signed   By: Lucienne Capers M.D.   On: 10/20/2013 23:35   Ct Angio Chest Pe W/cm &/or Wo Cm  10/21/2013   CLINICAL DATA:  Sudden onset midsternal chest pain and shortness of breath. History of DVT.   IMPRESSION: No evidence of significant pulmonary embolus. Emphysematous and bronchitic changes in the lungs.   Electronically Signed   By: Lucienne Capers M.D.   On:   PHYSICAL EXAM General: NAD Neck: No JVD, no thyromegaly or thyroid nodule.  Lungs: Clear to auscultation bilaterally with normal respiratory effort. CV: Nondisplaced PMI.  Heart regular S1/S2, no S3/S4, no murmur.  No peripheral edema.  Abdomen: Soft, nontender, no hepatosplenomegaly, no distention.  Neurologic: Alert and oriented x 3.  Psych: Normal affect. Extremities: No clubbing or cyanosis.   TELEMETRY: Reviewed telemetry pt in NSR    ASSESSMENT AND PLAN:  53 yo smoker with history of multiple DVTs and PEs presented with  chest pain and elevated troponin.    1. NSTEMI  - s/p PCI D1 with BMS with incomplete revascularization - EF 55% by echo 45-50% by cath  2. H/o PE/DVT in 2014 - Has IVC filter but not totally compliant with Xarelto   - CTA this admission did not show PE.   3. Tobacco abuse   Underwent cath Tuesday with occluded D1 and otherwise moderate non-obstructive CAD. EF 45-50%. Had PCI of D1 with BMS but still with sluggish flow in D1 and distal LAD.   Troponin trending down on 7/30. Will need ongoing anti-coagulation for previous DVT/PE. Given clot burden in D1 I feel he needs to continue Effient for at least 1 month (rather than Plavix). With increased risk of bleeding with triple therapy we plan to  treat with Effient + Eliquis (Eliquis begun today). Drop ASA. (WOEST trial). Also Amplify-EXT shows apixaban 2.5 bid is equivalent to full dose for preventing recurrent DVT/PE in patient who have already been treated for 6 months or more.   BP borderline, but no orthostasis anymore. Hold lisinopril, continue lower dose of Imdur.  Plan on discharging today with follow up in our clinic.   Ena Dawley H,MD 9:22 AM

## 2013-10-27 MED ORDER — ISOSORBIDE MONONITRATE ER 30 MG PO TB24: 30.0000 mg | ORAL_TABLET | Freq: Every day | ORAL | Status: DC

## 2013-10-27 MED ORDER — NITROGLYCERIN 0.4 MG SL SUBL: 0.4000 mg | SUBLINGUAL_TABLET | SUBLINGUAL | Status: DC | PRN

## 2013-10-27 MED ORDER — PRASUGREL HCL 10 MG PO TABS: 10.0000 mg | ORAL_TABLET | Freq: Every day | ORAL | Status: DC

## 2013-10-27 MED ORDER — METOPROLOL TARTRATE 12.5 MG HALF TABLET: 12.5000 mg | ORAL_TABLET | Freq: Two times a day (BID) | ORAL | Status: DC

## 2013-10-27 MED ORDER — ATORVASTATIN CALCIUM 80 MG PO TABS: 80.0000 mg | ORAL_TABLET | Freq: Every day | ORAL | Status: DC

## 2013-10-27 MED ORDER — APIXABAN 2.5 MG PO TABS: 2.5000 mg | ORAL_TABLET | Freq: Two times a day (BID) | ORAL | Status: DC

## 2013-10-27 NOTE — Progress Notes (Signed)
Subjective: Nervous.  No dizziness, CP, or SOB   Objective: Vital signs in last 24 hours: Temp:  [97.7 F (36.5 C)-98.4 F (36.9 C)] 97.7 F (36.5 C) (08/03 0544) Pulse Rate:  [56-86] 75 (08/03 0752) Resp:  [16-18] 18 (08/03 0752) BP: (84-107)/(48-61) 92/61 mmHg (08/03 0544) SpO2:  [97 %-100 %] 97 % (08/03 0752) Last BM Date: 10/26/13  Intake/Output from previous day:   Intake/Output this shift:    Medications Current Facility-Administered Medications  Medication Dose Route Frequency Provider Last Rate Last Dose  . 0.9 %  sodium chloride infusion   Intravenous Continuous Domenic Polite, MD   10 mL at 10/22/13 0301  . acetaminophen (TYLENOL) tablet 650 mg  650 mg Oral Q6H PRN Domenic Polite, MD   650 mg at 10/24/13 1246  . albuterol (PROVENTIL) (2.5 MG/3ML) 0.083% nebulizer solution 5 mg  5 mg Nebulization Q4H PRN Domenic Polite, MD      . ALPRAZolam Duanne Moron) tablet 0.25 mg  0.25 mg Oral Daily PRN Hosie Poisson, MD   0.25 mg at 10/25/13 2137  . apixaban (ELIQUIS) tablet 2.5 mg  2.5 mg Oral BID Larey Dresser, MD   2.5 mg at 10/26/13 2215  . atorvastatin (LIPITOR) tablet 80 mg  80 mg Oral q1800 Domenic Polite, MD   80 mg at 10/26/13 1705  . isosorbide mononitrate (IMDUR) 24 hr tablet 30 mg  30 mg Oral Daily Amy D Clegg, NP   30 mg at 10/26/13 1126  . loratadine (CLARITIN) tablet 10 mg  10 mg Oral Daily Thurnell Lose, MD   10 mg at 10/26/13 1126  . metoprolol tartrate (LOPRESSOR) tablet 12.5 mg  12.5 mg Oral BID Thurnell Lose, MD   12.5 mg at 10/26/13 2215  . mometasone-formoterol (DULERA) 100-5 MCG/ACT inhaler 2 puff  2 puff Inhalation BID Domenic Polite, MD   2 puff at 10/27/13 [phone]  . morphine 2 MG/ML injection 1-2 mg  1-2 mg Intravenous Q4H PRN Hosie Poisson, MD      . nitroGLYCERIN (NITROSTAT) SL tablet 0.4 mg  0.4 mg Sublingual Q5 min PRN Mariea Clonts, MD   0.4 mg at 10/20/13 2325  . ondansetron (ZOFRAN) injection 4 mg  4 mg Intravenous Q6H PRN Domenic Polite, MD       . oxyCODONE-acetaminophen (PERCOCET/ROXICET) 5-325 MG per tablet 1-2 tablet  1-2 tablet Oral Q4H PRN Hosie Poisson, MD   2 tablet at 10/26/13 2226  . pantoprazole (PROTONIX) EC tablet 40 mg  40 mg Oral BID Hosie Poisson, MD   40 mg at 10/26/13 2226  . prasugrel (EFFIENT) tablet 10 mg  10 mg Oral Daily Leonie Man, MD   10 mg at 10/26/13 1125    PE: General appearance: alert, cooperative and no distress Lungs: clear to auscultation bilaterally Heart: regular rate and rhythm, S1, S2 normal, no murmur, click, rub or gallop Extremities: No LEE Pulses: 2+ radials Skin: Warm and dry Neurologic: Grossly normal     Assessment/Plan     Active Problems:   NSTEMI (non-ST elevated myocardial infarction) - s/p PCI D1 with BMS with incomplete revascularization  - EF 55% by echo, 45-50% by cath  - On Effient and eliquis(DVT), lopressor 12.5mg  BID, lipitor    Hypotension  No dizziness.  Ambulating without difficulty.    Personal history of pulmonary embolism (2014)   Tobacco abuse   History of DVT (deep vein thrombosis) - Has IVC filter but not totally compliant with Xarelto  -  CTA this admission did not show PE.     Regular alcohol consumption   CAD (coronary artery disease)   COPD    Protein-calorie malnutrition, severe  Disposition:  DC home today.      LOS: 7 days    [name], BRYAN PA-C 10/27/2013 9:43 AM

## 2013-10-27 NOTE — Progress Notes (Signed)
CARDIAC REHAB PHASE I   PRE:  Rate/Rhythm: 58 SB  BP:  Supine: 90/40  Sitting:   Standing:    SaO2: 99%RA  MODE:  Ambulation: 550 ft   POST:  Rate/Rhythm: 65  BP:  Supine:   Sitting: 100/70  Standing:    SaO2: 100%RA [id number] Pt walked 550 ft on RA with steady gait. No CP. Tolerated well. Wants to go home.   Graylon Good, RN BSN  10/27/2013 9:13 AM

## 2013-10-27 NOTE — Progress Notes (Signed)
IV and tele monitor d/c at this time; pt given d/c instructions and prescriptions; pt verbalized understanding; will cont. To monitor.

## 2013-10-27 NOTE — Discharge Summary (Signed)
[name], is a 53 y.o. male  DOB 1960/12/25  MRN [id number].  Admission date:  10/20/2013  Admitting Physician  Domenic Polite, MD  Discharge Date:  10/27/2013   Primary MD  [name], MD  Recommendations for primary care physician for things to follow:   Monitor blood pressure and second risk factors for CAD closely.   Admission Diagnosis  Acute chest pain [786.50] NSTEMI (non-ST elevated myocardial infarction) [410.70] Asthma, persistent [493.90] DVT (deep venous thrombosis), unspecified laterality [453.40] Acute dyspnea [786.09] Acute asthma exacerbation, mild persistent [493.92]   Discharge Diagnosis  Acute chest pain [786.50] NSTEMI (non-ST elevated myocardial infarction) [410.70] Asthma, persistent [493.90] DVT (deep venous thrombosis), unspecified laterality [453.40] Acute dyspnea [786.09] Acute asthma exacerbation, mild persistent [493.92]     Principal Problem:   NSTEMI (non-ST elevated myocardial infarction) Active Problems:   Personal history of pulmonary embolism (2014)   Tobacco abuse   History of DVT (deep vein thrombosis)   Regular alcohol consumption   CAD (coronary artery disease)   COPD    Protein-calorie malnutrition, severe      Past Medical History  Diagnosis Date  . Asthma   . DVT (deep venous thrombosis)   . Pulmonary embolism     Past Surgical History  Procedure Laterality Date  . Ivc filter         History of present illness and  Hospital Course:     Kindly see H&P for history of present illness and admission details, please review complete Labs, Consult reports and Test reports for all details in brief  HPI  from the history and physical done on the day of admission    53 y.o. BM PMHx recurrent DVT/PE on Xarelto, asthma, tobacco abuse, alcohol use who presented  to the ER with chest pressure. 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 This was associated with sweating and some nausea. EMS was called and he was brought to the ER. En route he was given 3 sublingual nitroglycerin with only minimal benefit.  In the ER EKG, troponin x1 unremarkable. Underwent a CTA chest which was negative for PE. He was still experiencing CP and due to multiple risk factors for CAD he was admitted   Hospital Course     1.NSTEMI - status post left heart cath and PCI D1 with BMS with incomplete revascularization EF 55% by echo 45-50% by cath - currently chest pain-free, on Effient and Eliquis but no aspirin per cardiology, continue beta blocker, statin and Imdur, inactivity and symptom-free upon ambulation, tolerating low blood pressures well, discharged home. Cleared by cardiology.    2. Chronic mild systolic CHF EF around 32-12% (by cath, > 50% by TTE) . Compensated from the standpoint, on Imdur and beta blocker, blood pressure too low to tolerate ACE/ARB.     3. GERD mild epigastric abdominal pain. Pain completely resolved, Continue PPI     4. History of recurrent DVT PE. On  Eliquis dosed by cardiology and pharmacy.     5. COPD at baseline no wheezing. Supportive care only at this time.     6. Tobacco abuse and intermittent alcohol abuse. Counseled to quit. No signs of DTs.     7. Hypertension. Blood pressure soft continue present dose beta blocker and Imdur and monitor her PCP closely. Note is chronically dizzy upon standing up for 2-3 years likely has chronically low blood pressures, we'll give him a trial of TED stockings, ambulated 3 times in the hallway well without any symptoms, discharge on present medications.      Discharge Condition: stable   Follow UP  Follow-up Information   Follow up with Melina Copa, PA-C On 11/05/2013.  (12:00 PM - Dr. Claris Gladden PA)    Specialty:  Cardiology   Contact information:   [address] [place] [address] [phone]       Follow up with [name], MD. Schedule an appointment as soon as possible for a visit in 1 week.   Specialty:  Specialist   Contact information:   [place] [address] [phone]         Discharge Instructions  and  Discharge Medications      Discharge Instructions   Amb Referral to Cardiac Rehabilitation    Complete by:  As directed      Discharge instructions    Complete by:  As directed   Follow with Primary MD [name], MD in 7 days   Get CBC, CMP, 2 view Chest X ray checked  by Primary MD next visit.    Activity: As tolerated with Full fall precautions use walker/cane & assistance as needed   Disposition Home     Diet: Heart Healthy  Check your Weight same time everyday, if you gain over 2 pounds, or you develop in leg swelling, experience more shortness of breath or chest pain, call your Primary MD immediately. Follow Cardiac Low Salt Diet and 1.8 lit/day fluid restriction.   On your next visit with her primary care physician please Get Medicines reviewed and adjusted.  Please request your Prim.MD to go over all Hospital Tests and Procedure/Radiological results at the follow up, please get all Hospital records sent to your Prim MD by signing hospital release before you go home.   If you experience worsening of your admission symptoms, develop shortness of breath, life threatening emergency, suicidal or homicidal thoughts you must seek medical attention immediately by calling 911 or calling your MD immediately  if symptoms less severe.  You Must read complete instructions/literature along with all the possible adverse reactions/side effects for all the Medicines you take and that have been prescribed to you. Take any new Medicines after you have completely understood and  accpet all the possible adverse reactions/side effects.   Do not drive, operating heavy machinery, perform activities at heights, swimming or participation in water activities or provide baby sitting services if your were admitted for syncope or siezures until you have seen by Primary MD or a Neurologist and advised to do so again.  Do not drive when taking Pain medications.    Do not take more than prescribed Pain, Sleep and Anxiety Medications  Special Instructions: If you have smoked or chewed Tobacco  in the last 2 yrs please stop smoking, stop any regular Alcohol  and or any Recreational drug use.  Wear Seat belts while driving.   Please note  You were cared for by  a hospitalist during your hospital stay. If you have any questions about your discharge medications or the care you received while you were in the hospital after you are discharged, you can call the unit and asked to speak with the hospitalist on call if the hospitalist that took care of you is not available. Once you are discharged, your primary care physician will handle any further medical issues. Please note that NO REFILLS for any discharge medications will be authorized once you are discharged, as it is imperative that you return to your primary care physician (or establish a relationship with a primary care physician if you do not have one) for your aftercare needs so that they can reassess your need for medications and monitor your lab values.     Increase activity slowly    Complete by:  As directed             Medication List    STOP taking these medications       rivaroxaban 20 MG Tabs tablet  Commonly known as:  XARELTO      TAKE these medications       albuterol 108 (90 BASE) MCG/ACT inhaler  Commonly known as:  PROVENTIL HFA;VENTOLIN HFA  Inhale 2 puffs into the lungs every 6 (six) hours as needed for wheezing or shortness of breath.     apixaban 2.5 MG Tabs tablet  Commonly known as:  ELIQUIS    Take 1 tablet (2.5 mg total) by mouth 2 (two) times daily.     atorvastatin 80 MG tablet  Commonly known as:  LIPITOR  Take 1 tablet (80 mg total) by mouth daily at 6 PM.     Fluticasone-Salmeterol 250-50 MCG/DOSE Aepb  Commonly known as:  ADVAIR DISKUS  Inhale 1 puff into the lungs 2 (two) times daily.     isosorbide mononitrate 30 MG 24 hr tablet  Commonly known as:  IMDUR  Take 1 tablet (30 mg total) by mouth daily.     metoprolol tartrate 12.5 mg Tabs tablet  Commonly known as:  LOPRESSOR  Take 0.5 tablets (12.5 mg total) by mouth 2 (two) times daily.     nitroGLYCERIN 0.4 MG SL tablet  Commonly known as:  NITROSTAT  Place 1 tablet (0.4 mg total) under the tongue every 5 (five) minutes as needed for chest pain (CP or SOB).     prasugrel 10 MG Tabs tablet  Commonly known as:  EFFIENT  Take 1 tablet (10 mg total) by mouth daily.          Diet and Activity recommendation: See Discharge Instructions above   Consults obtained - Cards   Major procedures and Radiology Reports - PLEASE review detailed and final reports for all details, in brief -     Procedures left heart catheterization 10/21/2013    POST-OPERATIVE DIAGNOSIS:  Severe single branch vessel with moderate to severe additional 3 Vessel CAD:  100% D1 @ 40-50% eccentric Mid Wraparound LAD (TIMI 2 flow in LAD)  Large Ramus - proximal ~60-70%, non-flow limiting  Moderate prox & mid RCA (dominant) 40% & 50-60%  Borderline Successful revascularization of the proximal & mid D1 - Moderate Caliber 100% occluded D1 - reduced to 0% but only TIMI 2 flow in distal vessel with occluded terminal branch -- a 2.25 mm x 28 MultiLink Vision BMS was deployed to 2.5 mm.  Mildly reduced LVEF ~45-50% with Anterolateral Hypokinesis with moderately elevated LVEDP    PLAN OF CARE:  Post Radial  Cath Care - TR Band 15 mL air, 1220  IV Aggrestat x 12 hrs, restart IV Heparin 6 hr post TR band removal (run until tomorrow's  scheduled Xarelto  DAPT - Effient + ASA x 1 month then OK to convert to Plavix alone in setting of Xarelto 2d Echo  Would monitor @ least 2 days post cath.  If recurrent angina - consider FFR vs. IVUS guided PCI of mid LAd vs. Ramus Intermedius.   Leonie Man, M.D., M.S.  [facility]  [address] [place], [place] [address]  [phone]     TTE  - Left ventricle: The cavity size was normal. Wall thickness was normal. The estimated ejection fraction was 55%. Wall motion was normal; there were no regional wall motion abnormalities. - Right ventricle: The cavity size was normal. Systolic function was normal.      Dg Chest 2 View  10/20/2013   CLINICAL DATA:  Centralized chest pain, cough, congestion, and sneezing. Thirty year smoker.  EXAM: CHEST  2 VIEW  COMPARISON:  06/18/2013  FINDINGS: Normal heart size and pulmonary vascularity. Mild hyperinflation and central interstitial changes suggesting emphysema and chronic bronchitis. No focal airspace disease or consolidation in the lungs. No blunting of costophrenic angles. No pneumothorax. Degenerative changes in the thoracic spine.  IMPRESSION: Emphysematous changes and chronic bronchitic changes in the lungs. No evidence of active pulmonary disease.   Electronically Signed   By: Lucienne Capers M.D.   On: 10/20/2013 23:35   Ct Angio Chest Pe W/cm &/or Wo Cm  10/21/2013   CLINICAL DATA:  Sudden onset midsternal chest pain and shortness of breath. History of DVT.  EXAM: CT ANGIOGRAPHY CHEST WITH CONTRAST  TECHNIQUE: Multidetector CT imaging of the chest was performed using the standard protocol during bolus administration of intravenous contrast. Multiplanar CT image reconstructions and MIPs were obtained to evaluate the vascular anatomy.  CONTRAST:  100 mL Omnipaque 350  COMPARISON:  08/08/2012  FINDINGS: Technically adequate study with good opacification of the central and segmental pulmonary  arteries. No focal filling defects are demonstrated. No evidence of significant pulmonary embolus.  Normal heart size. Normal caliber thoracic aorta. Calcification in the coronary arteries. Esophagus is mostly decompressed. No significant lymphadenopathy in the chest. Emphysematous changes and scattered interstitial fibrosis in the lungs. Basilar dependent atelectasis. Bronchial wall thickening with peribronchial infiltration consistent with chronic bronchitis/ bronchiolitis. No focal airspace disease or consolidation in the lungs. No pneumothorax. No effusion.  Visualized portions of the upper abdominal organs are grossly unremarkable. Degenerative changes in the thoracic spine. No destructive bone lesions appreciated.  Review of the MIP images confirms the above findings.  IMPRESSION: No evidence of significant pulmonary embolus. Emphysematous and bronchitic changes in the lungs.   Electronically Signed   By: Lucienne Capers M.D.   On: 10/21/2013 00:25   US Abdomen Complete  10/21/2013   CLINICAL DATA:  Abdominal pain, nausea and vomiting.  EXAM: ULTRASOUND ABDOMEN COMPLETE  COMPARISON:  None.  FINDINGS: Gallbladder:  No gallstones or wall thickening visualized. No sonographic Murphy sign noted.  Common bile duct:  Diameter: 3.8 mm, normal  Liver:  No focal lesion identified. Within normal limits in parenchymal echogenicity.  IVC:  No abnormality visualized.  Pancreas:  Visualized portion unremarkable.  Spleen:  Size and appearance within normal limits.  Right Kidney:  Length: 11.1 cm. Echogenicity within normal limits. No mass or hydronephrosis visualized.  Left Kidney:  Length: 12.2 cm. Echogenicity within normal limits.  No mass or hydronephrosis visualized.  Abdominal aorta:  Limited visualization.  Overlying bowel gas.  Other findings:  None.  IMPRESSION: No acute abnormalities demonstrated.   Electronically Signed   By: Lucienne Capers M.D.   On: 10/21/2013 03:20    Micro Results      Recent  Results (from the past 240 hour(s))  MRSA PCR SCREENING     Status: None   Collection Time    10/21/13  4:43 AM      Result Value Ref Range Status   MRSA by PCR NEGATIVE  NEGATIVE Final   Comment:            The GeneXpert MRSA Assay (FDA     approved for NASAL specimens     only), is one component of a     comprehensive MRSA colonization     surveillance program. It is not     intended to diagnose MRSA     infection nor to guide or     monitor treatment for     MRSA infections.       Today   Subjective:   [name] today has no headache,no chest abdominal pain,no new weakness tingling or numbness, feels much better wants to go home today.   Objective:   Blood pressure 92/61, pulse 75, temperature 97.7 F (36.5 C), temperature source Oral, resp. rate 18, height 6\' 2"  (1.88 m), weight 75.7 kg (166 lb 14.2 oz), SpO2 97.00%.  No intake or output data in the 24 hours ending 10/27/13 1036  Exam Awake Alert, Oriented x 3, No new F.N deficits, Normal affect [place].AT,PERRAL Supple Neck,No JVD, No cervical lymphadenopathy appriciated.  Symmetrical Chest wall movement, Good air movement bilaterally, CTAB RRR,No Gallops,Rubs or new Murmurs, No Parasternal Heave +ve B.Sounds, Abd Soft, Non tender, No organomegaly appriciated, No rebound -guarding or rigidity. No Cyanosis, Clubbing or edema, No new Rash or bruise  Data Review   CBC w Diff:  Lab Results  Component Value Date   WBC 3.5* 10/24/2013   HGB 10.6* 10/24/2013   HCT 32.0* 10/24/2013   PLT 130* 10/24/2013   LYMPHOPCT 32 10/20/2013   MONOPCT 9 10/20/2013   EOSPCT 6* 10/20/2013   BASOPCT 0 10/20/2013    CMP:  Lab Results  Component Value Date   NA 140 10/24/2013   K 4.3 10/24/2013   CL 103 10/24/2013   CO2 25 10/24/2013   BUN 7 10/24/2013   CREATININE 0.78 10/24/2013   PROT 6.8 10/21/2013   ALBUMIN 3.2* 10/21/2013   BILITOT 0.4 10/21/2013   ALKPHOS 80 10/21/2013   AST 41* 10/21/2013   ALT 22 10/21/2013  .   Total  Time in preparing paper work, data evaluation and todays exam - 35 minutes  Thurnell Lose M.D on 10/27/2013 at 10:36 AM  [facility] Office  [phone]   **Disclaimer: This note may have been dictated with voice recognition software. Similar sounding words can inadvertently be transcribed and this note may contain transcription errors which may not have been corrected upon publication of note.**

## 2013-10-30 ENCOUNTER — Encounter: Payer: Self-pay | Admitting: Physician Assistant

## 2013-11-05 ENCOUNTER — Encounter: Payer: Medicare Other | Admitting: Physician Assistant

## 2013-11-28 DIAGNOSIS — R5383 Other fatigue: Secondary | ICD-10-CM | POA: Diagnosis not present

## 2013-11-28 DIAGNOSIS — E7800 Pure hypercholesterolemia, unspecified: Secondary | ICD-10-CM | POA: Diagnosis not present

## 2013-11-28 DIAGNOSIS — J019 Acute sinusitis, unspecified: Secondary | ICD-10-CM | POA: Diagnosis not present

## 2013-11-28 DIAGNOSIS — J45909 Unspecified asthma, uncomplicated: Secondary | ICD-10-CM | POA: Diagnosis not present

## 2013-11-28 DIAGNOSIS — E291 Testicular hypofunction: Secondary | ICD-10-CM | POA: Diagnosis not present

## 2013-11-28 DIAGNOSIS — R5381 Other malaise: Secondary | ICD-10-CM | POA: Diagnosis not present

## 2013-11-28 DIAGNOSIS — E290 Testicular hyperfunction: Secondary | ICD-10-CM | POA: Diagnosis not present

## 2013-11-28 DIAGNOSIS — I80299 Phlebitis and thrombophlebitis of other deep vessels of unspecified lower extremity: Secondary | ICD-10-CM | POA: Diagnosis not present

## 2014-01-26 ENCOUNTER — Encounter (HOSPITAL_COMMUNITY): Payer: Self-pay | Admitting: Emergency Medicine

## 2014-01-26 ENCOUNTER — Inpatient Hospital Stay (HOSPITAL_COMMUNITY)
Admission: EM | Admit: 2014-01-26 | Discharge: 2014-01-28 | DRG: 190 | Disposition: A | Discharge status: Home / Self Care | Payer: Medicare Other | Attending: Internal Medicine | Admitting: Internal Medicine

## 2014-01-26 ENCOUNTER — Emergency Department (HOSPITAL_COMMUNITY): Payer: Medicare Other

## 2014-01-26 DIAGNOSIS — F17210 Nicotine dependence, cigarettes, uncomplicated: Secondary | ICD-10-CM | POA: Diagnosis present

## 2014-01-26 DIAGNOSIS — R0602 Shortness of breath: Secondary | ICD-10-CM | POA: Diagnosis not present

## 2014-01-26 DIAGNOSIS — J45909 Unspecified asthma, uncomplicated: Secondary | ICD-10-CM | POA: Diagnosis present

## 2014-01-26 DIAGNOSIS — J45901 Unspecified asthma with (acute) exacerbation: Secondary | ICD-10-CM

## 2014-01-26 DIAGNOSIS — R0789 Other chest pain: Secondary | ICD-10-CM | POA: Diagnosis not present

## 2014-01-26 DIAGNOSIS — I509 Heart failure, unspecified: Secondary | ICD-10-CM | POA: Insufficient documentation

## 2014-01-26 DIAGNOSIS — J9801 Acute bronchospasm: Secondary | ICD-10-CM | POA: Diagnosis present

## 2014-01-26 DIAGNOSIS — J441 Chronic obstructive pulmonary disease with (acute) exacerbation: Principal | ICD-10-CM | POA: Diagnosis present

## 2014-01-26 DIAGNOSIS — J9601 Acute respiratory failure with hypoxia: Secondary | ICD-10-CM

## 2014-01-26 DIAGNOSIS — I5041 Acute combined systolic (congestive) and diastolic (congestive) heart failure: Secondary | ICD-10-CM | POA: Diagnosis present

## 2014-01-26 DIAGNOSIS — R071 Chest pain on breathing: Secondary | ICD-10-CM | POA: Diagnosis not present

## 2014-01-26 DIAGNOSIS — Z720 Tobacco use: Secondary | ICD-10-CM | POA: Diagnosis not present

## 2014-01-26 DIAGNOSIS — Z86711 Personal history of pulmonary embolism: Secondary | ICD-10-CM

## 2014-01-26 DIAGNOSIS — R069 Unspecified abnormalities of breathing: Secondary | ICD-10-CM | POA: Diagnosis not present

## 2014-01-26 DIAGNOSIS — I252 Old myocardial infarction: Secondary | ICD-10-CM

## 2014-01-26 DIAGNOSIS — F1010 Alcohol abuse, uncomplicated: Secondary | ICD-10-CM | POA: Diagnosis present

## 2014-01-26 DIAGNOSIS — R079 Chest pain, unspecified: Secondary | ICD-10-CM | POA: Diagnosis not present

## 2014-01-26 DIAGNOSIS — I2510 Atherosclerotic heart disease of native coronary artery without angina pectoris: Secondary | ICD-10-CM | POA: Diagnosis present

## 2014-01-26 DIAGNOSIS — Z86718 Personal history of other venous thrombosis and embolism: Secondary | ICD-10-CM

## 2014-01-26 HISTORY — DX: Acute myocardial infarction, unspecified: I21.9

## 2014-01-26 HISTORY — DX: Chronic obstructive pulmonary disease, unspecified: J44.9

## 2014-01-26 HISTORY — DX: Headache: R51

## 2014-01-26 HISTORY — DX: Pneumonia, unspecified organism: J18.9

## 2014-01-26 HISTORY — DX: Headache, unspecified: R51.9

## 2014-01-26 HISTORY — DX: Atherosclerotic heart disease of native coronary artery without angina pectoris: I25.10

## 2014-01-26 LAB — BASIC METABOLIC PANEL
ANION GAP: 13 (ref 5–15)
BUN: 10 mg/dL (ref 6–23)
CO2: 23 mEq/L (ref 19–32)
Calcium: 9.4 mg/dL (ref 8.4–10.5)
Chloride: 103 mEq/L (ref 96–112)
Creatinine, Ser: 0.63 mg/dL (ref 0.50–1.35)
GFR calc Af Amer: >90 mL/min (ref >90)
GFR calc non Af Amer: >90 mL/min (ref >90)
Glucose, Bld: 103 mg/dL — ABNORMAL HIGH (ref 70–99)
Potassium: 3.9 mEq/L (ref 3.7–5.3)
SODIUM: 139 meq/L (ref 137–147)

## 2014-01-26 LAB — PRO B NATRIURETIC PEPTIDE: Pro B Natriuretic peptide (BNP): 1619 pg/mL — ABNORMAL HIGH (ref 0–125)

## 2014-01-26 LAB — CBC
HCT: 35.9 % — ABNORMAL LOW (ref 39.0–52.0)
Hemoglobin: 12.1 g/dL — ABNORMAL LOW (ref 13.0–17.0)
MCH: 31.3 pg (ref 26.0–34.0)
MCHC: 33.7 g/dL (ref 30.0–36.0)
MCV: 93 fL (ref 78.0–100.0)
PLATELETS: 127 10*3/uL — AB (ref 150–400)
RBC: 3.86 MIL/uL — AB (ref 4.22–5.81)
RDW: 13.2 % (ref 11.5–15.5)
WBC: 3.6 10*3/uL — ABNORMAL LOW (ref 4.0–10.5)

## 2014-01-26 LAB — I-STAT TROPONIN, ED: TROPONIN I, POC: 0.02 ng/mL (ref 0.00–0.08)

## 2014-01-26 LAB — TROPONIN I: Troponin I: <0.3 ng/mL (ref <0.30)

## 2014-01-26 MED ORDER — INFLUENZA VAC SPLIT QUAD 0.5 ML IM SUSY
0.5000 mL | PREFILLED_SYRINGE | INTRAMUSCULAR | Status: AC
  Administered 2014-01-27: 0.5 mL via INTRAMUSCULAR
  Filled 2014-01-26: qty 0.5

## 2014-01-26 MED ORDER — APIXABAN 2.5 MG PO TABS
2.5000 mg | ORAL_TABLET | Freq: Two times a day (BID) | ORAL | Status: DC
  Administered 2014-01-26 – 2014-01-28 (×4): 2.5 mg via ORAL
  Filled 2014-01-26 (×5): qty 1

## 2014-01-26 MED ORDER — ATORVASTATIN CALCIUM 80 MG PO TABS
80.0000 mg | ORAL_TABLET | Freq: Every day | ORAL | Status: DC
  Administered 2014-01-26 – 2014-01-27 (×2): 80 mg via ORAL
  Filled 2014-01-26 (×3): qty 1

## 2014-01-26 MED ORDER — ALBUTEROL (5 MG/ML) CONTINUOUS INHALATION SOLN
10.0000 mg/h | INHALATION_SOLUTION | Freq: Once | RESPIRATORY_TRACT | Status: AC
  Administered 2014-01-26: 10 mg/h via RESPIRATORY_TRACT
  Filled 2014-01-26: qty 20

## 2014-01-26 MED ORDER — ASPIRIN 81 MG PO CHEW: 324.0000 mg | CHEWABLE_TABLET | Freq: Once | ORAL | Status: DC

## 2014-01-26 MED ORDER — ALUM & MAG HYDROXIDE-SIMETH 200-200-20 MG/5ML PO SUSP: 30.0000 mL | Freq: Four times a day (QID) | ORAL | Status: DC | PRN

## 2014-01-26 MED ORDER — IPRATROPIUM BROMIDE 0.02 % IN SOLN
0.5000 mg | Freq: Once | RESPIRATORY_TRACT | Status: AC
  Administered 2014-01-26: 0.5 mg via RESPIRATORY_TRACT
  Filled 2014-01-26: qty 2.5

## 2014-01-26 MED ORDER — NITROGLYCERIN 0.4 MG SL SUBL: 0.4000 mg | SUBLINGUAL_TABLET | SUBLINGUAL | Status: DC | PRN

## 2014-01-26 MED ORDER — IPRATROPIUM-ALBUTEROL 0.5-2.5 (3) MG/3ML IN SOLN
3.0000 mL | RESPIRATORY_TRACT | Status: DC
  Administered 2014-01-26: 3 mL via RESPIRATORY_TRACT
  Filled 2014-01-26: qty 3

## 2014-01-26 MED ORDER — ACETAMINOPHEN 650 MG RE SUPP: 650.0000 mg | Freq: Four times a day (QID) | RECTAL | Status: DC | PRN

## 2014-01-26 MED ORDER — SODIUM CHLORIDE 0.9 % IJ SOLN: 3.0000 mL | INTRAMUSCULAR | Status: DC | PRN

## 2014-01-26 MED ORDER — METHYLPREDNISOLONE SODIUM SUCC 125 MG IJ SOLR
125.0000 mg | Freq: Once | INTRAMUSCULAR | Status: AC
  Administered 2014-01-26: 125 mg via INTRAVENOUS
  Filled 2014-01-26: qty 2

## 2014-01-26 MED ORDER — ISOSORBIDE MONONITRATE ER 30 MG PO TB24
30.0000 mg | ORAL_TABLET | Freq: Every day | ORAL | Status: DC
  Administered 2014-01-26 – 2014-01-28 (×3): 30 mg via ORAL
  Filled 2014-01-26 (×3): qty 1

## 2014-01-26 MED ORDER — TIOTROPIUM BROMIDE MONOHYDRATE 18 MCG IN CAPS
18.0000 ug | ORAL_CAPSULE | Freq: Every day | RESPIRATORY_TRACT | Status: DC
Start: 2014-01-26 — End: 2014-01-26

## 2014-01-26 MED ORDER — PRASUGREL HCL 10 MG PO TABS
10.0000 mg | ORAL_TABLET | Freq: Every day | ORAL | Status: DC
  Administered 2014-01-26 – 2014-01-28 (×3): 10 mg via ORAL
  Filled 2014-01-26 (×3): qty 1

## 2014-01-26 MED ORDER — METHYLPREDNISOLONE SODIUM SUCC 125 MG IJ SOLR
60.0000 mg | Freq: Four times a day (QID) | INTRAMUSCULAR | Status: DC
  Administered 2014-01-26 – 2014-01-28 (×7): 60 mg via INTRAVENOUS
  Filled 2014-01-26 (×8): qty 0.96
  Filled 2014-01-26: qty 2
  Filled 2014-01-26 (×3): qty 0.96

## 2014-01-26 MED ORDER — ONDANSETRON HCL 4 MG PO TABS: 4.0000 mg | ORAL_TABLET | Freq: Four times a day (QID) | ORAL | Status: DC | PRN

## 2014-01-26 MED ORDER — LEVOFLOXACIN IN D5W 750 MG/150ML IV SOLN
750.0000 mg | INTRAVENOUS | Status: DC
  Administered 2014-01-26: 750 mg via INTRAVENOUS
  Filled 2014-01-26: qty 150

## 2014-01-26 MED ORDER — FUROSEMIDE 10 MG/ML IJ SOLN
40.0000 mg | Freq: Once | INTRAMUSCULAR | Status: AC
  Administered 2014-01-26: 40 mg via INTRAVENOUS
  Filled 2014-01-26: qty 4

## 2014-01-26 MED ORDER — OXYCODONE HCL 5 MG PO TABS
5.0000 mg | ORAL_TABLET | ORAL | Status: DC | PRN
  Administered 2014-01-27 – 2014-01-28 (×4): 5 mg via ORAL
  Filled 2014-01-26 (×4): qty 1

## 2014-01-26 MED ORDER — SODIUM CHLORIDE 0.9 % IV SOLN: 250.0000 mL | INTRAVENOUS | Status: DC | PRN

## 2014-01-26 MED ORDER — MAGNESIUM SULFATE 2 GM/50ML IV SOLN
2.0000 g | Freq: Once | INTRAVENOUS | Status: AC
  Administered 2014-01-26: 2 g via INTRAVENOUS
  Filled 2014-01-26: qty 50

## 2014-01-26 MED ORDER — ACETAMINOPHEN 325 MG PO TABS: 650.0000 mg | ORAL_TABLET | Freq: Four times a day (QID) | ORAL | Status: DC | PRN

## 2014-01-26 MED ORDER — IPRATROPIUM-ALBUTEROL 0.5-2.5 (3) MG/3ML IN SOLN: 3.0000 mL | Freq: Three times a day (TID) | RESPIRATORY_TRACT | Status: DC

## 2014-01-26 MED ORDER — ONDANSETRON HCL 4 MG/2ML IJ SOLN: 4.0000 mg | Freq: Four times a day (QID) | INTRAMUSCULAR | Status: DC | PRN

## 2014-01-26 MED ORDER — SODIUM CHLORIDE 0.9 % IJ SOLN
3.0000 mL | Freq: Two times a day (BID) | INTRAMUSCULAR | Status: DC
  Administered 2014-01-26: 3 mL via INTRAVENOUS

## 2014-01-26 MED ORDER — MOMETASONE FURO-FORMOTEROL FUM 100-5 MCG/ACT IN AERO
2.0000 | INHALATION_SPRAY | Freq: Two times a day (BID) | RESPIRATORY_TRACT | Status: DC
  Administered 2014-01-26 – 2014-01-28 (×3): 2 via RESPIRATORY_TRACT
  Filled 2014-01-26 (×2): qty 8.8

## 2014-01-26 NOTE — ED Provider Notes (Signed)
CSN: [id number]     Arrival date & time 01/26/14  8144 History   First MD Initiated Contact with Patient 01/26/14 [phone]     No chief complaint on file.    (Consider location/radiation/quality/duration/timing/severity/associated sxs/prior Treatment) Patient is a 53 y.o. male presenting with shortness of breath. The history is provided by the patient.  Shortness of Breath Severity:  Moderate Onset quality:  Gradual Timing:  Constant Progression:  Worsening Chronicity:  New Context: URI   Relieved by:  Nothing Worsened by:  Nothing tried Ineffective treatments:  Inhaler Associated symptoms: chest pain (pressure like, central, 3/10) and cough (productive of clear phlegm)   Associated symptoms: no abdominal pain, no fever (but is having chills), no neck pain and no vomiting     Past Medical History  Diagnosis Date  . Asthma   . DVT (deep venous thrombosis)   . Pulmonary embolism    Past Surgical History  Procedure Laterality Date  . Ivc filter     Family History  Problem Relation Age of Onset  . Asthma Mother   . Allergies Mother   . Allergies Sister   . Allergies Brother   . Deep vein thrombosis Brother     two brothers with recurrent DVT   History  Substance Use Topics  . Smoking status: Current Every Day Smoker -- 0.20 packs/day for 35 years    Types: Cigarettes  . Smokeless tobacco: Never Used  . Alcohol Use: 0.0 oz/week     Comment: drinks 2 40oz beers weekly, drinks about a pint of liquor    Review of Systems  Constitutional: Negative for fever (but is having chills).  Respiratory: Positive for cough (productive of clear phlegm) and shortness of breath.   Cardiovascular: Positive for chest pain (pressure like, central, 3/10).  Gastrointestinal: Negative for vomiting and abdominal pain.  Musculoskeletal: Negative for neck pain.  All other systems reviewed and are negative.     Allergies  Review of patient's allergies indicates no known allergies.  Home  Medications   Prior to Admission medications   Medication Sig Start Date End Date Taking? Authorizing Provider  albuterol (PROVENTIL HFA;VENTOLIN HFA) 108 (90 BASE) MCG/ACT inhaler Inhale 2 puffs into the lungs every 6 (six) hours as needed for wheezing or shortness of breath. 03/28/13   Orson Eva, MD  apixaban (ELIQUIS) 2.5 MG TABS tablet Take 1 tablet (2.5 mg total) by mouth 2 (two) times daily. 10/27/13   Thurnell Lose, MD  atorvastatin (LIPITOR) 80 MG tablet Take 1 tablet (80 mg total) by mouth daily at 6 PM. 10/27/13   Thurnell Lose, MD  Fluticasone-Salmeterol (ADVAIR DISKUS) 250-50 MCG/DOSE AEPB Inhale 1 puff into the lungs 2 (two) times daily. 06/20/13   Nishant Dhungel, MD  isosorbide mononitrate (IMDUR) 30 MG 24 hr tablet Take 1 tablet (30 mg total) by mouth daily. 10/27/13   Thurnell Lose, MD  metoprolol tartrate (LOPRESSOR) 12.5 mg TABS tablet Take 0.5 tablets (12.5 mg total) by mouth 2 (two) times daily. 10/27/13   Thurnell Lose, MD  nitroGLYCERIN (NITROSTAT) 0.4 MG SL tablet Place 1 tablet (0.4 mg total) under the tongue every 5 (five) minutes as needed for chest pain (CP or SOB). 10/27/13   Thurnell Lose, MD  prasugrel (EFFIENT) 10 MG TABS tablet Take 1 tablet (10 mg total) by mouth daily. 10/27/13   Thurnell Lose, MD   There were no vitals taken for this visit. Physical Exam  Constitutional: He is oriented to  person, place, and time. He appears well-developed and well-nourished. He appears distressed.  HENT:  Head: Normocephalic and atraumatic.  Mouth/Throat: No oropharyngeal exudate.  Eyes: EOM are normal. Pupils are equal, round, and reactive to light.  Neck: Normal range of motion. Neck supple.  Cardiovascular: Normal rate and regular rhythm.  Exam reveals no friction rub.   No murmur heard. Pulmonary/Chest: He is in respiratory distress (moderate). He has wheezes (diffuse, moderate). He has no rales.  Abdominal: He exhibits no distension. There is no tenderness. There is  no rebound.  Musculoskeletal: Normal range of motion. He exhibits no edema.  Neurological: He is alert and oriented to person, place, and time.  Skin: He is not diaphoretic.  Nursing note and vitals reviewed.   ED Course  Procedures (including critical care time) Labs Review Labs Reviewed  CBC - Abnormal; Notable for the following:    WBC 3.6 (*)    RBC 3.86 (*)    Hemoglobin 12.1 (*)    HCT 35.9 (*)    Platelets 127 (*)    All other components within normal limits  BASIC METABOLIC PANEL - Abnormal; Notable for the following:    Glucose, Bld 103 (*)    All other components within normal limits  PRO B NATRIURETIC PEPTIDE - Abnormal; Notable for the following:    Pro B Natriuretic peptide (BNP) 1619.0 (*)    All other components within normal limits  I-STAT TROPOININ, ED    Imaging Review Dg Chest Portable 1 View  01/26/2014   CLINICAL DATA:  Shortness of breath; history of asthma and previous pulmonary embolism ; history of coronary artery disease.  EXAM: PORTABLE CHEST - 1 VIEW  COMPARISON:  PA and lateral chest x-ray of October 20, 2013.  FINDINGS: The lungs are adequately inflated and clear. The heart and mediastinal structures are normal. The pulmonary vascularity is not engorged. There is no pleural effusion or pneumothorax. The bony thorax is unremarkable.  IMPRESSION: There is no acute cardiopulmonary abnormality.   Electronically Signed   By: David  Martinique   On: 01/26/2014 09:52     EKG Interpretation   Date/Time:  Monday January 26 2014 09:36:04 EST Ventricular Rate:  84 PR Interval:  185 QRS Duration: 83 QT Interval:  403 QTC Calculation: 476 R Axis:   -8 Text Interpretation:  Sinus rhythm Probable left atrial enlargement  Probable anteroseptal infarct, old Similar to prior Confirmed by [facility]   MD, [place] ([id number]) on 01/26/2014 4:40:55 PM      MDM   Final diagnoses:  SOB (shortness of breath)  Congestive Heart Failure, acute, unspecified systolic or  diastolic   67E presents with SOB. Hx of asthma. Recent cough, productive of clear phlegm. Has used his albuterol inhaler without relief. Persistent SOB with wheezing. Also having CP - pressure like. Had MI 3 months ago, feels different than that episode of chest pain. Had BMS stent placed during cath about 3 months ago. Here moderate respiratory distress with diffuse wheezing. Will give solumedrol, check cardiac labs, CXR. Continuous albuterol/ipratropium and magnesium given. Labs show new heart failure. Little to no relief with the albuterol. Lasix given. Admitted to medicine.   Evelina Bucy, MD 01/26/14 [phone]

## 2014-01-26 NOTE — Progress Notes (Signed)
Utilization review complete. Evlyn Amason RN CCM Case Mgmt phone [phone] 

## 2014-01-26 NOTE — Progress Notes (Signed)
Pt reported off to [facility].  Pt to be transferred shortly

## 2014-01-26 NOTE — ED Notes (Signed)
Pt reports 1 week hx of cough, over the weekend developed SOB. Pt reports using inhaler until it ran out. Pt reports chills not fever. Began having increased SOB this AM. Pt also reports substernal CP and pressure which developed 2 days ago. Pt with dry non productive cough. Pt with 20g IV left hand, 324mg  ASA, 10mg  albuterol and 0.5mg  atrovent, 1 sl nitro with chest pain relief from 5 to 2. Pt in moderate distress at present.

## 2014-01-26 NOTE — Consult Note (Signed)
Reason for Consult:  CP  Referring Physician: Dr. Coralyn Pear [name] is an 53 y.o. male.  HPI:   The patient is a 53 yo male with a history of CAD, recent NSTEMI in July with revascularization of the proximal & mid D1 with BMS.  DCd on Effient.  His history also includes DVT/PE for which he now takes Eliquis.  IVC filter in place.  History of noncompliance with coumadin prior to 2012   Echo in July revealed an EF of 55% with normal wall motion.   The patient reports developing SOB and wheezing in the last week.  He has had some CP which feels like heaviness and is worse with deep breath.  He has missed two doses of Effient in the last two days because he ran out.  The patient currently denies nausea, vomiting, fever, orthopnea, dizziness, PND, abdominal pain, hematochezia, melena, lower extremity edema.   Past Medical History  Diagnosis Date  . Asthma   . DVT (deep venous thrombosis)   . Pulmonary embolism     Past Surgical History  Procedure Laterality Date  . Ivc filter      Family History  Problem Relation Age of Onset  . Asthma Mother   . Allergies Mother   . Allergies Sister   . Allergies Brother   . Deep vein thrombosis Brother     two brothers with recurrent DVT    Social History:  reports that he has been smoking Cigarettes.  He has a 7 pack-year smoking history. He has never used smokeless tobacco. He reports that he drinks alcohol. He reports that he does not use illicit drugs.  Allergies: No Known Allergies  Medications:  Scheduled Meds: . aspirin  324 mg Oral Once   Continuous Infusions:  PRN Meds:.nitroGLYCERIN   Results for orders placed or performed during the hospital encounter of 01/26/14 (from the past 48 hour(s))  CBC     Status: Abnormal   Collection Time: 01/26/14  9:58 AM  Result Value Ref Range   WBC 3.6 (L) 4.0 - 10.5 K/uL   RBC 3.86 (L) 4.22 - 5.81 MIL/uL   Hemoglobin 12.1 (L) 13.0 - 17.0 g/dL   HCT 35.9 (L) 39.0 - 52.0 %   MCV  93.0 78.0 - 100.0 fL   MCH 31.3 26.0 - 34.0 pg   MCHC 33.7 30.0 - 36.0 g/dL   RDW 13.2 11.5 - 15.5 %   Platelets 127 (L) 150 - 400 K/uL  Basic metabolic panel     Status: Abnormal   Collection Time: 01/26/14  9:58 AM  Result Value Ref Range   Sodium 139 137 - 147 mEq/L   Potassium 3.9 3.7 - 5.3 mEq/L   Chloride 103 96 - 112 mEq/L   CO2 23 19 - 32 mEq/L   Glucose, Bld 103 (H) 70 - 99 mg/dL   BUN 10 6 - 23 mg/dL   Creatinine, Ser 0.63 0.50 - 1.35 mg/dL   Calcium 9.4 8.4 - 10.5 mg/dL   GFR calc non Af Amer >90 >90 mL/min   GFR calc Af Amer >90 >90 mL/min    Comment: (NOTE) The eGFR has been calculated using the CKD EPI equation. This calculation has not been validated in all clinical situations. eGFR's persistently <90 mL/min signify possible Chronic Kidney Disease.    Anion gap 13 5 - 15  Pro b natriuretic peptide (BNP)     Status: Abnormal   Collection Time: 01/26/14  9:58  AM  Result Value Ref Range   Pro B Natriuretic peptide (BNP) 1619.0 (H) 0 - 125 pg/mL  I-stat troponin, ED     Status: None   Collection Time: 01/26/14 10:01 AM  Result Value Ref Range   Troponin i, poc 0.02 0.00 - 0.08 ng/mL   Comment 3            Comment: Due to the release kinetics of cTnI, a negative result within the first hours of the onset of symptoms does not rule out myocardial infarction with certainty. If myocardial infarction is still suspected, repeat the test at appropriate intervals.     Dg Chest Portable 1 View  01/26/2014   CLINICAL DATA:  Shortness of breath; history of asthma and previous pulmonary embolism ; history of coronary artery disease.  EXAM: PORTABLE CHEST - 1 VIEW  COMPARISON:  PA and lateral chest x-ray of October 20, 2013.  FINDINGS: The lungs are adequately inflated and clear. The heart and mediastinal structures are normal. The pulmonary vascularity is not engorged. There is no pleural effusion or pneumothorax. The bony thorax is unremarkable.  IMPRESSION: There is no acute  cardiopulmonary abnormality.   Electronically Signed   By: David  Martinique   On: 01/26/2014 09:52    Review of Systems  Constitutional: Negative for fever and diaphoresis.  HENT: Positive for congestion.   Respiratory: Positive for cough, shortness of breath and wheezing.   Cardiovascular: Positive for chest pain. Negative for orthopnea and leg swelling.  Gastrointestinal: Negative for nausea and vomiting.  Neurological: Negative for dizziness.  All other systems reviewed and are negative.  Blood pressure 136/84, pulse 81, temperature 98.4 F (36.9 C), temperature source Oral, resp. rate 19, height _0  (1.88 m), weight 175 lb (79.379 kg), SpO2 96 %. Physical Exam  Nursing note and vitals reviewed. Constitutional: He is oriented to person, place, and time. He appears well-developed and well-nourished. No distress.  HENT:  Head: Normocephalic and atraumatic.  Eyes: EOM are normal. Pupils are equal, round, and reactive to light.  Neck: Normal range of motion. Neck supple. No JVD present.  Cardiovascular: Normal rate, regular rhythm, S1 normal and S2 normal.   No murmur heard. Pulses:      Radial pulses are 2+ on the right side, and 2+ on the left side.       Dorsalis pedis pulses are 2+ on the right side, and 2+ on the left side.  No carotid bruit  Respiratory: Effort normal. He has wheezes. He has no rales.  GI: Bowel sounds are normal. He exhibits no distension.  Musculoskeletal: He exhibits no edema.  Neurological: He is alert and oriented to person, place, and time. He exhibits normal muscle tone.  Skin: Skin is warm and dry.  Psychiatric: He has a normal mood and affect.    Assessment/Plan:  See below  [name], [name] 01/26/2014, 2:53 PM   Patient seen with PA, agree with the above note.   1. Dyspnea: Patient is wheezing diffusely on exam and coughing.  He has chest pain with inspiration primarily.  I suspect COPD/asthma exacerbation.  He does not appear to be significantly  volume overloaded on exam and no CHF on CXR.  He is still smoking occasionally.  - I would treat for COPD exacerbation with nebs, steroids, antibiotics.  - Would hold off on diuresis at this time.  - Needs to quit smoking altogether.  2. CAD: Pleuritic chest pain, suspect due to bronchospasm and not ischemia.  -  Would cycle cardiac enzymes, no heparin gtt unless troponin positive.  - Continue Effient and apixaban, continue statin.  - Would get an echo post-intervention (PCI with NSTEMI in 7/15) to make sure that EF remains normal.  3. PE/DVT: Patient is getting long-term treatment with apixaban 2.5 mg bid after multiple PE/DVTs.  He has an IVC filter.   Loralie Champagne 01/26/2014 3:25 PM

## 2014-01-26 NOTE — Progress Notes (Signed)
Nutrition Brief Note  Patient identified on the Malnutrition Screening Tool (MST) Report  Wt Readings from Last 15 Encounters:  01/26/14 175 lb (79.379 kg)  10/21/13 166 lb 14.2 oz (75.7 kg)  06/18/13 156 lb 15.5 oz (71.2 kg)  06/03/13 160 lb (72.576 kg)  03/25/13 170 lb (77.111 kg)  02/22/13 159 lb 9.8 oz (72.4 kg)  08/08/12 180 lb (81.647 kg)  05/28/12 185 lb 3.2 oz (84.006 kg)  04/01/12 200 lb 6.4 oz (90.901 kg)  02/14/12 185 lb 13.6 oz (84.3 kg)   [name] is a 53 y.o. male with a past medical history non-ST segment elevation myocardial infarction undergoing left heart catheterization on 10/21/2013, found to have severe single branch vessel with moderate to severe additional three-vessel coronary artery disease, 100% D1 at 40-50% eccentric mid wraparound LAD, status post successful revascularization of the proximal and mid D1.  Body mass index is 22.46 kg/(m^2). Patient meets criteria for normal weight based on current BMI.   Current diet order is Heart Healthy, patient is consuming approximately n/a% of meals at this time. Labs and medications reviewed.   No nutrition interventions warranted at this time. If nutrition issues arise, please consult RD.   Daivion Pape A. Jimmye Norman, RD, LDN Pager: [phone] After hours Pager: [phone]

## 2014-01-26 NOTE — ED Notes (Signed)
Attempted report 

## 2014-01-26 NOTE — H&P (Signed)
[facility] History and Physical  Micaiah Litle SLH:[id number] DOB: 11-10-1960 DOA: 01/26/2014  Referring physician:  PCP: [name], MD   Chief Complaint: Chest Pain  HPI: [name] is a 53 y.o. male with a past medical history non-ST segment elevation myocardial infarction undergoing left heart catheterization on 10/21/2013, found to have severe single branch vessel with moderate to severe additional three-vessel coronary artery disease, 100% D1 at 40-50% eccentric mid wraparound LAD, status post successful revascularization of the proximal and mid D1. He was discharged on Effient and Eliquis therapy. Patient presenting to the emergency department with complaints of shortness of breath, wheezing and chest pain. He complains of left parasternal chest pain characterized as heavy/pressure like that is similar to presentation in July 2015. He denies palpitations, dizziness, lightheadedness, syncope, presyncope. He also complains of increasing wheezing, sputum production, cough which she attributes to asthma. In the emergency department had troponin within normal range, EKG did not show ischemic changes, chest x-ray within normal limits. He was administered nitroglycerin in the emergency department after which his chest pain symptoms improved. Cardiology consulted.                                                                                                                                                                                                                                                Review of Systems:  Constitutional:  No weight loss, night sweats, Fevers, chills, fatigue.  HEENT:  No headaches, Difficulty swallowing,Tooth/dental problems,Sore throat,  No sneezing, itching, ear ache, nasal congestion, post nasal drip,  Cardio-vascular:  Positive for chest pain, Orthopnea, PND, swelling in lower extremities, anasarca, dizziness, palpitations  GI:  No  heartburn, indigestion, abdominal pain, nausea, vomiting, diarrhea, change in bowel habits, loss of appetite  Resp:  Positive forshortness of breath with exertion or at rest. No excess mucus, no productive cough, No non-productive cough, No coughing up of blood.No change in color of mucus. Positive for wheezing.No chest wall deformity  Skin:  no rash or lesions.  GU:  no dysuria, change in color of urine, no urgency or frequency. No flank pain.  Musculoskeletal:  No joint pain or swelling. No decreased range of motion. No back pain.  Psych:  No change in mood or affect. No depression or anxiety. No memory loss.   Past Medical History  Diagnosis Date  . Asthma   .  DVT (deep venous thrombosis)   . Pulmonary embolism    Past Surgical History  Procedure Laterality Date  . Ivc filter     Social History:  reports that he has been smoking Cigarettes.  He has a 7 pack-year smoking history. He has never used smokeless tobacco. He reports that he drinks alcohol. He reports that he does not use illicit drugs.  No Known Allergies  Family History  Problem Relation Age of Onset  . Asthma Mother   . Allergies Mother   . Allergies Sister   . Allergies Brother   . Deep vein thrombosis Brother     two brothers with recurrent DVT     Prior to Admission medications   Medication Sig Start Date End Date Taking? Authorizing Provider  albuterol (PROVENTIL HFA;VENTOLIN HFA) 108 (90 BASE) MCG/ACT inhaler Inhale 2 puffs into the lungs every 6 (six) hours as needed for wheezing or shortness of breath. 03/28/13  Yes Orson Eva, MD  apixaban (ELIQUIS) 2.5 MG TABS tablet Take 1 tablet (2.5 mg total) by mouth 2 (two) times daily. 10/27/13  Yes Thurnell Lose, MD  atorvastatin (LIPITOR) 80 MG tablet Take 1 tablet (80 mg total) by mouth daily at 6 PM. 10/27/13  Yes Thurnell Lose, MD  Fluticasone-Salmeterol (ADVAIR DISKUS) 250-50 MCG/DOSE AEPB Inhale 1 puff into the lungs 2 (two) times daily. 06/20/13  Yes  Nishant Dhungel, MD  isosorbide mononitrate (IMDUR) 30 MG 24 hr tablet Take 1 tablet (30 mg total) by mouth daily. 10/27/13  Yes Thurnell Lose, MD  metoprolol tartrate (LOPRESSOR) 12.5 mg TABS tablet Take 0.5 tablets (12.5 mg total) by mouth 2 (two) times daily. 10/27/13  Yes Thurnell Lose, MD  prasugrel (EFFIENT) 10 MG TABS tablet Take 1 tablet (10 mg total) by mouth daily. 10/27/13  Yes Thurnell Lose, MD  tiotropium (SPIRIVA) 18 MCG inhalation capsule Place 18 mcg into inhaler and inhale daily.   Yes Historical Provider, MD  nitroGLYCERIN (NITROSTAT) 0.4 MG SL tablet Place 1 tablet (0.4 mg total) under the tongue every 5 (five) minutes as needed for chest pain (CP or SOB). 10/27/13   Thurnell Lose, MD   Physical Exam: Filed Vitals:   01/26/14 1130 01/26/14 1145 01/26/14 1200 01/26/14 1215  BP: 139/77 138/77 155/86 136/84  Pulse: 82 78 74 81  Temp:      TempSrc:      Resp: 23 15 21 19   Height:      Weight:      SpO2: 95% 96% 96% 96%    Wt Readings from Last 3 Encounters:  01/26/14 79.379 kg (175 lb)  10/21/13 75.7 kg (166 lb 14.2 oz)  06/18/13 71.2 kg (156 lb 15.5 oz)    General:  Appears calm and comfortable, no acute distress, does not require supplemental oxygen. Eyes: PERRL, normal lids, irises & conjunctiva ENT: grossly normal hearing, lips & tongue Neck: no LAD, masses or thyromegaly Cardiovascular: RRR, no m/r/g. No LE edema. Telemetry: SR, no arrhythmias  Respiratory: He has diminished breath sounds bilaterally, bilateral expiratory wheezing, positive rhonchi Abdomen: soft, ntnd Skin: no rash or induration seen on limited exam Musculoskeletal: grossly normal tone BUE/BLE Psychiatric: grossly normal mood and affect, speech fluent and appropriate Neurologic: grossly non-focal.          Labs on Admission:  Basic Metabolic Panel:  Recent Labs Lab 01/26/14 0958  NA 139  K 3.9  CL 103  CO2 23  GLUCOSE 103*  BUN 10  CREATININE 0.63  CALCIUM 9.4   Liver  Function Tests: No results for input(s): AST, ALT, ALKPHOS, BILITOT, PROT, ALBUMIN in the last 168 hours. No results for input(s): LIPASE, AMYLASE in the last 168 hours. No results for input(s): AMMONIA in the last 168 hours. CBC:  Recent Labs Lab 01/26/14 0958  WBC 3.6*  HGB 12.1*  HCT 35.9*  MCV 93.0  PLT 127*   Cardiac Enzymes: No results for input(s): CKTOTAL, CKMB, CKMBINDEX, TROPONINI in the last 168 hours.  BNP (last 3 results)  Recent Labs  01/26/14 0958  PROBNP 1619.0*   CBG: No results for input(s): GLUCAP in the last 168 hours.  Radiological Exams on Admission: Dg Chest Portable 1 View  01/26/2014   CLINICAL DATA:  Shortness of breath; history of asthma and previous pulmonary embolism ; history of coronary artery disease.  EXAM: PORTABLE CHEST - 1 VIEW  COMPARISON:  PA and lateral chest x-ray of October 20, 2013.  FINDINGS: The lungs are adequately inflated and clear. The heart and mediastinal structures are normal. The pulmonary vascularity is not engorged. There is no pleural effusion or pneumothorax. The bony thorax is unremarkable.  IMPRESSION: There is no acute cardiopulmonary abnormality.   Electronically Signed   By: David  Martinique   On: 01/26/2014 09:52    EKG: Independently reviewed. EKG not revealing ischemic changes  Assessment/Plan Principal Problem:   Chest pain Active Problems:   COPD exacerbation   Tobacco abuse   CAD (coronary artery disease)   Shortness of breath   1. Chest pain. Patient with established history of coronary artery disease undergoing left heart cath in July 2015 with successful revascularization of the proximal and mid D1 lesion. Patient's chest pain symptoms improving in the emergency department after the administration of nitroglycerin. He had a negative troponin, EKG did not reveal ischemic changes. Will admit patient to telemetry, cycle troponin, repeat limited echo, consult cardiology. Will continue his home regimen with  Effient 10 mg by mouth daily, Eliquis 2.5 mg by mouth twice a day, metoprolol 12.5 mg by mouth twice a day, Imdur 30 mg by mouth daily, Lipitor 80 mg by mouth daily.  2. Suspected COPD exacerbation. Issue presenting with complaints of cough, green to brown sputum production, shortness of breath having extensive wheezes on lung exam. Will provide IV steroids, scheduled duo nebs, empiric antibiotic therapy with Levaquin. Repeat chest x-ray in a.m. 3. Hypertension. Blood pressure stable, continue Imdur and Lopressor 4. Dyslipidemia. Continue Lipitor 80 mg by mouth daily   Code Status: Full code DVT Prophylaxis: Patient on Eliquis Family Communication: Family not present Disposition Plan: Anticipate he will not require greater than 2 nights hospitalization will place him in 24 hour observation  Time spent: 50 min  Kelvin Cellar [facility] Pager [phone]

## 2014-01-27 DIAGNOSIS — I252 Old myocardial infarction: Secondary | ICD-10-CM | POA: Diagnosis not present

## 2014-01-27 DIAGNOSIS — J441 Chronic obstructive pulmonary disease with (acute) exacerbation: Secondary | ICD-10-CM | POA: Diagnosis not present

## 2014-01-27 DIAGNOSIS — Z86711 Personal history of pulmonary embolism: Secondary | ICD-10-CM | POA: Diagnosis not present

## 2014-01-27 DIAGNOSIS — R0602 Shortness of breath: Secondary | ICD-10-CM | POA: Diagnosis present

## 2014-01-27 DIAGNOSIS — I5041 Acute combined systolic (congestive) and diastolic (congestive) heart failure: Secondary | ICD-10-CM | POA: Diagnosis present

## 2014-01-27 DIAGNOSIS — Z720 Tobacco use: Secondary | ICD-10-CM | POA: Diagnosis not present

## 2014-01-27 DIAGNOSIS — R079 Chest pain, unspecified: Secondary | ICD-10-CM

## 2014-01-27 DIAGNOSIS — J45909 Unspecified asthma, uncomplicated: Secondary | ICD-10-CM | POA: Diagnosis present

## 2014-01-27 DIAGNOSIS — J9601 Acute respiratory failure with hypoxia: Secondary | ICD-10-CM

## 2014-01-27 DIAGNOSIS — F17210 Nicotine dependence, cigarettes, uncomplicated: Secondary | ICD-10-CM | POA: Diagnosis present

## 2014-01-27 DIAGNOSIS — F1010 Alcohol abuse, uncomplicated: Secondary | ICD-10-CM | POA: Diagnosis present

## 2014-01-27 DIAGNOSIS — R072 Precordial pain: Secondary | ICD-10-CM | POA: Diagnosis not present

## 2014-01-27 DIAGNOSIS — I519 Heart disease, unspecified: Secondary | ICD-10-CM | POA: Diagnosis not present

## 2014-01-27 DIAGNOSIS — Z86718 Personal history of other venous thrombosis and embolism: Secondary | ICD-10-CM | POA: Diagnosis not present

## 2014-01-27 DIAGNOSIS — I2510 Atherosclerotic heart disease of native coronary artery without angina pectoris: Secondary | ICD-10-CM | POA: Diagnosis present

## 2014-01-27 DIAGNOSIS — R071 Chest pain on breathing: Secondary | ICD-10-CM | POA: Diagnosis not present

## 2014-01-27 DIAGNOSIS — J9801 Acute bronchospasm: Secondary | ICD-10-CM | POA: Diagnosis present

## 2014-01-27 LAB — BASIC METABOLIC PANEL
ANION GAP: 15 (ref 5–15)
BUN: 18 mg/dL (ref 6–23)
CHLORIDE: 100 meq/L (ref 96–112)
CO2: 24 mEq/L (ref 19–32)
Calcium: 9.4 mg/dL (ref 8.4–10.5)
Creatinine, Ser: 0.75 mg/dL (ref 0.50–1.35)
GFR calc Af Amer: >90 mL/min (ref >90)
Glucose, Bld: 140 mg/dL — ABNORMAL HIGH (ref 70–99)
POTASSIUM: 4.2 meq/L (ref 3.7–5.3)
SODIUM: 139 meq/L (ref 137–147)

## 2014-01-27 LAB — CBC
HCT: 32.8 % — ABNORMAL LOW (ref 39.0–52.0)
HEMOGLOBIN: 11.3 g/dL — AB (ref 13.0–17.0)
MCH: 31.9 pg (ref 26.0–34.0)
MCHC: 34.5 g/dL (ref 30.0–36.0)
MCV: 92.7 fL (ref 78.0–100.0)
Platelets: 169 10*3/uL (ref 150–400)
RBC: 3.54 MIL/uL — AB (ref 4.22–5.81)
RDW: 13.2 % (ref 11.5–15.5)
WBC: 3.5 10*3/uL — AB (ref 4.0–10.5)

## 2014-01-27 LAB — TROPONIN I: Troponin I: <0.3 ng/mL (ref <0.30)

## 2014-01-27 MED ORDER — ALBUTEROL SULFATE (2.5 MG/3ML) 0.083% IN NEBU
2.5000 mg | INHALATION_SOLUTION | RESPIRATORY_TRACT | Status: DC | PRN
  Administered 2014-01-27: 2.5 mg via RESPIRATORY_TRACT
  Filled 2014-01-27: qty 3

## 2014-01-27 MED ORDER — IPRATROPIUM-ALBUTEROL 0.5-2.5 (3) MG/3ML IN SOLN
3.0000 mL | Freq: Four times a day (QID) | RESPIRATORY_TRACT | Status: DC
  Administered 2014-01-27 – 2014-01-28 (×6): 3 mL via RESPIRATORY_TRACT
  Filled 2014-01-27 (×6): qty 3

## 2014-01-27 MED ORDER — AZITHROMYCIN 500 MG PO TABS
500.0000 mg | ORAL_TABLET | Freq: Every day | ORAL | Status: DC
  Administered 2014-01-27 – 2014-01-28 (×2): 500 mg via ORAL
  Filled 2014-01-27 (×2): qty 1

## 2014-01-27 NOTE — Progress Notes (Signed)
Patient ID: [name], male   DOB: [birth year], 53 y.o.   MRN: [id number]    Subjective:  STill coughing and dyspnic no chest pain   Objective:  Filed Vitals:   01/27/14 0153 01/27/14 0539 01/27/14 0629 01/27/14 0743  BP: 135/83  132/83   Pulse: 88  88   Temp: 97.9 F (36.6 C)  98.1 F (36.7 C)   TempSrc: Oral  Oral   Resp: 18  17   Height:      Weight:   75.5 kg (166 lb 7.2 oz)   SpO2: 97% 98% 100% 96%    Intake/Output from previous day:  Intake/Output Summary (Last 24 hours) at 01/27/14 1140 Last data filed at 01/27/14 0955  Gross per 24 hour  Intake   1200 ml  Output   1141 ml  Net     59 ml    Physical Exam: Affect appropriate Thin black male  HEENT: normal Neck supple with no adenopathy JVP normal no bruits no thyromegaly Lungs diffuse rhonchi and  wheezing and good diaphragmatic motion Heart:  S1/S2 no murmur, no rub, gallop or click PMI normal Abdomen: benighn, BS positve, no tenderness, no AAA no bruit.  No HSM or HJR Distal pulses intact with no bruits No edema Neuro non-focal Skin warm and dry No muscular weakness   Lab Results: Basic Metabolic Panel:  Recent Labs  01/26/14 0958 01/27/14 0527  NA 139 139  K 3.9 4.2  CL 103 100  CO2 23 24  GLUCOSE 103* 140*  BUN 10 18  CREATININE 0.63 0.75  CALCIUM 9.4 9.4   CBC:  Recent Labs  01/26/14 0958 01/27/14 0527  WBC 3.6* 3.5*  HGB 12.1* 11.3*  HCT 35.9* 32.8*  MCV 93.0 92.7  PLT 127* 169   Cardiac Enzymes:  Recent Labs  01/26/14 2214 01/26/14 2351 01/27/14 0527  TROPONINI <0.30 <0.30 <0.30    Imaging: Dg Chest Portable 1 View  01/26/2014   CLINICAL DATA:  Shortness of breath; history of asthma and previous pulmonary embolism ; history of coronary artery disease.  EXAM: PORTABLE CHEST - 1 VIEW  COMPARISON:  PA and lateral chest x-ray of October 20, 2013.  FINDINGS: The lungs are adequately inflated and clear. The heart and mediastinal structures are normal. The pulmonary  vascularity is not engorged. There is no pleural effusion or pneumothorax. The bony thorax is unremarkable.  IMPRESSION: There is no acute cardiopulmonary abnormality.   Electronically Signed   By: David  Martinique   On: 01/26/2014 09:52    Cardiac Studies:  ECG:   Telemetry:  NSR no arrhythmia   Echo:  Pending   Medications:   . apixaban  2.5 mg Oral BID  . atorvastatin  80 mg Oral q1800  . azithromycin  500 mg Oral Daily  . ipratropium-albuterol  3 mL Nebulization QID  . isosorbide mononitrate  30 mg Oral Daily  . methylPREDNISolone (SOLU-MEDROL) injection  60 mg Intravenous Q6H  . mometasone-formoterol  2 puff Inhalation BID  . prasugrel  10 mg Oral Daily       Assessment/Plan:  CAD:  Stable pleuritic pain from COPD exacerbation.  Recent NSTEMI in July with revascularization of the proximal & mid D1 with BMS Continue Effient and nitrates  R/O no acute ECG changes  Echo pending for LV function  COPD:  Primary issue Lungs still sound bad  Continue steroids, antibiotics and nebs  DVT/PE:  With IVC filter on apixaban   Chol:  Continue statin  Jenkins Rouge 01/27/2014, 11:40 AM

## 2014-01-27 NOTE — Progress Notes (Signed)
[facility] PROGRESS NOTE  Assessment/Plan: Chest pain - consulted cardiology, ECHo pending to monitor EF. - cardiac markers negative x 3. - Continue Effient and apixaban, continue statin. -  Acute respiratory failure with hypoxia due toCOPD exacerbation - IV steroids, antibiotics and inhalers. - PT consult  CAD (coronary artery disease) - See above.  Tobacco abuse - counseling.    Code Status: full Family Communication: none  Disposition Plan: inpatient   Consultants:  cardiology  Procedures:  echo  Antibiotics:  azithro  HPI/Subjective: Relates SOB is better.  Objective: Filed Vitals:   01/27/14 0153 01/27/14 0539 01/27/14 0629 01/27/14 0743  BP: 135/83  132/83   Pulse: 88  88   Temp: 97.9 F (36.6 C)  98.1 F (36.7 C)   TempSrc: Oral  Oral   Resp: 18  17   Height:      Weight:   75.5 kg (166 lb 7.2 oz)   SpO2: 97% 98% 100% 96%    Intake/Output Summary (Last 24 hours) at 01/27/14 0802 Last data filed at 01/27/14 0600  Gross per 24 hour  Intake    890 ml  Output   1016 ml  Net   -126 ml   Filed Weights   01/26/14 0945 01/26/14 1745 01/27/14 0629  Weight: 79.379 kg (175 lb) 74.3 kg (163 lb 12.8 oz) 75.5 kg (166 lb 7.2 oz)    Exam:  General: Alert, awake, oriented x3, in no acute distress.  HEENT: No bruits, no goiter.  Heart: Regular rate and rhythm. Lungs: Good air movement, clear Abdomen: Soft, nontender, nondistended, positive bowel sounds.    Data Reviewed: Basic Metabolic Panel:  Recent Labs Lab 01/26/14 0958 01/27/14 0527  NA 139 139  K 3.9 4.2  CL 103 100  CO2 23 24  GLUCOSE 103* 140*  BUN 10 18  CREATININE 0.63 0.75  CALCIUM 9.4 9.4   Liver Function Tests: No results for input(s): AST, ALT, ALKPHOS, BILITOT, PROT, ALBUMIN in the last 168 hours. No results for input(s): LIPASE, AMYLASE in the last 168 hours. No results for input(s): AMMONIA in the last 168 hours. CBC:  Recent Labs Lab 01/26/14 0958  01/27/14 0527  WBC 3.6* 3.5*  HGB 12.1* 11.3*  HCT 35.9* 32.8*  MCV 93.0 92.7  PLT 127* 169   Cardiac Enzymes:  Recent Labs Lab 01/26/14 2214 01/26/14 2351 01/27/14 0527  TROPONINI <0.30 <0.30 <0.30   BNP (last 3 results)  Recent Labs  01/26/14 0958  PROBNP 1619.0*   CBG: No results for input(s): GLUCAP in the last 168 hours.  No results found for this or any previous visit (from the past 240 hour(s)).   Studies: Dg Chest Portable 1 View  01/26/2014   CLINICAL DATA:  Shortness of breath; history of asthma and previous pulmonary embolism ; history of coronary artery disease.  EXAM: PORTABLE CHEST - 1 VIEW  COMPARISON:  PA and lateral chest x-ray of October 20, 2013.  FINDINGS: The lungs are adequately inflated and clear. The heart and mediastinal structures are normal. The pulmonary vascularity is not engorged. There is no pleural effusion or pneumothorax. The bony thorax is unremarkable.  IMPRESSION: There is no acute cardiopulmonary abnormality.   Electronically Signed   By: David  Martinique   On: 01/26/2014 09:52    Scheduled Meds: . apixaban  2.5 mg Oral BID  . atorvastatin  80 mg Oral q1800  . Influenza vac split quadrivalent PF  0.5 mL Intramuscular Tomorrow-1000  . ipratropium-albuterol  3 mL Nebulization QID  . isosorbide mononitrate  30 mg Oral Daily  . levofloxacin (LEVAQUIN) IV  750 mg Intravenous Q24H  . methylPREDNISolone (SOLU-MEDROL) injection  60 mg Intravenous Q6H  . mometasone-formoterol  2 puff Inhalation BID  . prasugrel  10 mg Oral Daily  . sodium chloride  3 mL Intravenous Q12H   Continuous Infusions:    Charlynne Cousins  [facility] Pager [phone]. If 8PM-8AM, please contact night-coverage at [email or web address], password [facility] 01/27/2014, 8:02 AM  LOS: 1 day

## 2014-01-27 NOTE — Plan of Care (Signed)
Problem: Phase I Progression Outcomes Goal: Pain controlled with appropriate interventions Outcome: Completed/Met Date Met:  01/27/14     

## 2014-01-28 DIAGNOSIS — R079 Chest pain, unspecified: Secondary | ICD-10-CM

## 2014-01-28 MED ORDER — AZITHROMYCIN 500 MG PO TABS
500.0000 mg | ORAL_TABLET | Freq: Every day | ORAL | Status: DC
Start: 2014-01-28 — End: 2014-05-01

## 2014-01-28 MED ORDER — IPRATROPIUM-ALBUTEROL 0.5-2.5 (3) MG/3ML IN SOLN: 3.0000 mL | Freq: Three times a day (TID) | RESPIRATORY_TRACT | Status: DC

## 2014-01-28 MED ORDER — ALBUTEROL SULFATE (2.5 MG/3ML) 0.083% IN NEBU: 3.0000 mL | INHALATION_SOLUTION | Freq: Four times a day (QID) | RESPIRATORY_TRACT | Status: DC | PRN

## 2014-01-28 MED ORDER — PREDNISONE 20 MG PO TABS: 40.0000 mg | ORAL_TABLET | Freq: Every day | ORAL | Status: DC

## 2014-01-28 MED ORDER — PREDNISONE 50 MG PO TABS
60.0000 mg | ORAL_TABLET | Freq: Every day | ORAL | Status: DC
  Filled 2014-01-28: qty 1

## 2014-01-28 NOTE — Care Management Note (Signed)
    Page 1 of 1   01/28/2014     1:48:03 PM CARE MANAGEMENT NOTE 01/28/2014  Patient:  [name],[name]   Account Number:  [id number]  Date Initiated:  01/26/2014  Documentation initiated by:  Jonnie Finner  Subjective/Objective Assessment:   COPD exacerbation     Action/Plan:   CM to follow for disposition needs   Anticipated DC Date:  01/28/2014   Anticipated DC Plan:  [place]  CM consult      Choice offered to / List presented to:             Status of service:  Completed, signed off Medicare Important Message given?  NA - LOS <3 / Initial given by admissions (If response is "NO", the following Medicare IM given date fields will be blank) Date Medicare IM given:   Medicare IM given by:   Date Additional Medicare IM given:   Additional Medicare IM given by:    Discharge Disposition:  HOME/SELF CARE  Per UR Regulation:  Reviewed for med. necessity/level of care/duration of stay  If discussed at [place] of Stay Meetings, dates discussed:    Comments:  Canton Yearby RN, BSN, MSHL, CCM  Nurse - Case Manager,  (Unit [place])  [phone]  01/28/2014 Sats:  no home O2 needs identified Disposition:  home / self care.

## 2014-01-28 NOTE — Discharge Summary (Signed)
Physician Discharge Summary  [name] YCX:[id number] DOB: 1960/11/02 DOA: 01/26/2014  PCP: [name], MD  Admit date: 01/26/2014 Discharge date: 01/28/2014  Time spent: >45 minutes  Recommendations for Outpatient Follow-up:  1. PFTs if not already done  Discharge Condition: stable Diet recommendation: heart healthy  Discharge Diagnoses:  Principal Problem:   Chest pain Active Problems:   COPD exacerbation   Acute respiratory failure with hypoxia   Tobacco abuse   CAD (coronary artery disease)   History of present illness:  This is a 53 y/o male with PMH of CAD on Effient, h/o PE/DVT on Eliquis with IVC filter in place who was admitted for shortness of breath, wheezing and chest heaviness. He had missed 2 doses of Effient as he had ran out of it. He also complained of cough with green sputum. He was admitted for chest pain and a COPD exacerbation.   Hospital Course:  Chest pain with h/o CAD -  ECHO is unrevealing - see below - cardiac markers negative x 3. - Continue Effient and apixaban, continue statin. - cardiology was consulted - per cardiology, it is felt that his chest pain is not related to cardiac issues and is likely more related to respiratory issues- pain has resolved and has not recurred    Acute respiratory failure with hypoxia due toCOPD exacerbation -initiated on IV steroids, antibiotics and inhalers - transitioned to oral steroids today - not requiring O2 and not longer wheezing- able to ambulated without significant dyspnea or hypoxia - cough also improving with little sputum production - cont oupt Spiriva, Advair and Proair   Tobacco abuse - counseled to stop smoking  Procedures:  ECHO  - Left ventricle: The cavity size was normal. Systolic function was normal. The estimated ejection fraction was in the range of 55% to 60%. Wall motion was normal; there were no regional wall motion abnormalities. Doppler parameters are consistent  with abnormal left ventricular relaxation (grade 1 diastolic dysfunction).  Consultations:  cardiology  Discharge Exam: Filed Weights   01/26/14 1745 01/27/14 0629 01/28/14 4970  Weight: 74.3 kg (163 lb 12.8 oz) 75.5 kg (166 lb 7.2 oz) 77.338 kg (170 lb 8 oz)   Filed Vitals:   01/28/14 0924  BP:   Pulse: 70  Temp:   Resp: 20    General: AAO x 3, no distress Cardiovascular: RRR, no murmurs  Respiratory: clear to auscultation bilaterally GI: soft, non-tender, non-distended, bowel sound positive  Discharge Instructions You were cared for by a hospitalist during your hospital stay. If you have any questions about your discharge medications or the care you received while you were in the hospital after you are discharged, you can call the unit and asked to speak with the hospitalist on call if the hospitalist that took care of you is not available. Once you are discharged, your primary care physician will handle any further medical issues. Please note that NO REFILLS for any discharge medications will be authorized once you are discharged, as it is imperative that you return to your primary care physician (or establish a relationship with a primary care physician if you do not have one) for your aftercare needs so that they can reassess your need for medications and monitor your lab values.      Discharge Instructions    Diet - low sodium heart healthy    Complete by:  As directed      Increase activity slowly    Complete by:  As directed  Medication List    TAKE these medications        albuterol 108 (90 BASE) MCG/ACT inhaler  Commonly known as:  PROVENTIL HFA;VENTOLIN HFA  Inhale 2 puffs into the lungs every 6 (six) hours as needed for wheezing or shortness of breath.     apixaban 2.5 MG Tabs tablet  Commonly known as:  ELIQUIS  Take 1 tablet (2.5 mg total) by mouth 2 (two) times daily.     atorvastatin 80 MG tablet  Commonly known as:  LIPITOR  Take 1  tablet (80 mg total) by mouth daily at 6 PM.     azithromycin 500 MG tablet  Commonly known as:  ZITHROMAX  Take 1 tablet (500 mg total) by mouth daily.     Fluticasone-Salmeterol 250-50 MCG/DOSE Aepb  Commonly known as:  ADVAIR DISKUS  Inhale 1 puff into the lungs 2 (two) times daily.     isosorbide mononitrate 30 MG 24 hr tablet  Commonly known as:  IMDUR  Take 1 tablet (30 mg total) by mouth daily.     metoprolol tartrate 12.5 mg Tabs tablet  Commonly known as:  LOPRESSOR  Take 0.5 tablets (12.5 mg total) by mouth 2 (two) times daily.     nitroGLYCERIN 0.4 MG SL tablet  Commonly known as:  NITROSTAT  Place 1 tablet (0.4 mg total) under the tongue every 5 (five) minutes as needed for chest pain (CP or SOB).     prasugrel 10 MG Tabs tablet  Commonly known as:  EFFIENT  Take 1 tablet (10 mg total) by mouth daily.     predniSONE 20 MG tablet  Commonly known as:  DELTASONE  Take 2 tablets (40 mg total) by mouth daily with breakfast.  Start taking on:  01/29/2014     tiotropium 18 MCG inhalation capsule  Commonly known as:  SPIRIVA  Place 18 mcg into inhaler and inhale daily.       No Known Allergies Follow-up Information    Follow up with [name], MD In 3 days.   Specialty:  Specialist   Contact information:   [address] [place] [address] [phone]        The results of significant diagnostics from this hospitalization (including imaging, microbiology, ancillary and laboratory) are listed below for reference.    Significant Diagnostic Studies: Dg Chest Portable 1 View  01/26/2014   CLINICAL DATA:  Shortness of breath; history of asthma and previous pulmonary embolism ; history of coronary artery disease.  EXAM: PORTABLE CHEST - 1 VIEW  COMPARISON:  PA and lateral chest x-ray of October 20, 2013.  FINDINGS: The lungs are adequately inflated and clear. The heart and mediastinal structures are normal. The pulmonary vascularity is not engorged.  There is no pleural effusion or pneumothorax. The bony thorax is unremarkable.  IMPRESSION: There is no acute cardiopulmonary abnormality.   Electronically Signed   By: David  Martinique   On: 01/26/2014 09:52    Microbiology: No results found for this or any previous visit (from the past 240 hour(s)).   Labs: Basic Metabolic Panel:  Recent Labs Lab 01/26/14 0958 01/27/14 0527  NA 139 139  K 3.9 4.2  CL 103 100  CO2 23 24  GLUCOSE 103* 140*  BUN 10 18  CREATININE 0.63 0.75  CALCIUM 9.4 9.4   Liver Function Tests: No results for input(s): AST, ALT, ALKPHOS, BILITOT, PROT, ALBUMIN in the last 168 hours. No results for input(s): LIPASE, AMYLASE in the last  168 hours. No results for input(s): AMMONIA in the last 168 hours. CBC:  Recent Labs Lab 01/26/14 0958 01/27/14 0527  WBC 3.6* 3.5*  HGB 12.1* 11.3*  HCT 35.9* 32.8*  MCV 93.0 92.7  PLT 127* 169   Cardiac Enzymes:  Recent Labs Lab 01/26/14 2214 01/26/14 2351 01/27/14 0527  TROPONINI <0.30 <0.30 <0.30   BNP: BNP (last 3 results)  Recent Labs  01/26/14 0958  PROBNP 1619.0*   CBG: No results for input(s): GLUCAP in the last 168 hours.     SignedDebbe Odea, MD [facility] 01/28/2014, 11:57 AM

## 2014-01-28 NOTE — Progress Notes (Signed)
Patient Name: [name] Date of Encounter: 01/28/2014     Principal Problem:   Chest pain Active Problems:   Tobacco abuse   CAD (coronary artery disease)   COPD exacerbation   Shortness of breath   Acute respiratory failure with hypoxia    SUBJECTIVE  Denies SOB, some pleuritic CP worse with cough. Increased cough this morning when waking up. States this episode of chest pain feels different from prior MI.  CURRENT MEDS . apixaban  2.5 mg Oral BID  . atorvastatin  80 mg Oral q1800  . azithromycin  500 mg Oral Daily  . ipratropium-albuterol  3 mL Nebulization QID  . isosorbide mononitrate  30 mg Oral Daily  . methylPREDNISolone (SOLU-MEDROL) injection  60 mg Intravenous Q6H  . mometasone-formoterol  2 puff Inhalation BID  . prasugrel  10 mg Oral Daily    OBJECTIVE  Filed Vitals:   01/27/14 1318 01/27/14 2056 01/27/14 2243 01/28/14 0613  BP: 126/69  118/80 123/72  Pulse: 93  95 80  Temp: 98.4 F (36.9 C)   98 F (36.7 C)  TempSrc: Oral   Oral  Resp: 18  18 18   Height:      Weight:    170 lb 8 oz (77.338 kg)  SpO2: 97% 100% 95% 97%    Intake/Output Summary (Last 24 hours) at 01/28/14 0742 Last data filed at 01/28/14 8676  Gross per 24 hour  Intake    840 ml  Output   1200 ml  Net   -360 ml   Filed Weights   01/26/14 1745 01/27/14 0629 01/28/14 0613  Weight: 163 lb 12.8 oz (74.3 kg) 166 lb 7.2 oz (75.5 kg) 170 lb 8 oz (77.338 kg)    PHYSICAL EXAM  General: Pleasant, NAD. Neuro: Alert and oriented X 3. Moves all extremities spontaneously. Psych: Normal affect. HEENT:  Normal  Neck: Supple without bruits or JVD. Lungs:  Resp regular and unlabored, expiratory wheezing and rhonchi Heart: RRR no s3, s4, or murmurs. Abdomen: Soft, non-tender, non-distended, BS + x 4.  Extremities: No clubbing, cyanosis or edema. DP/PT/Radials 2+ and equal bilaterally.  Accessory Clinical Findings  CBC  Recent Labs  01/26/14 0958 01/27/14 0527  WBC 3.6*  3.5*  HGB 12.1* 11.3*  HCT 35.9* 32.8*  MCV 93.0 92.7  PLT 127* 195   Basic Metabolic Panel  Recent Labs  01/26/14 0958 01/27/14 0527  NA 139 139  K 3.9 4.2  CL 103 100  CO2 23 24  GLUCOSE 103* 140*  BUN 10 18  CREATININE 0.63 0.75  CALCIUM 9.4 9.4   Cardiac Enzymes  Recent Labs  01/26/14 2214 01/26/14 2351 01/27/14 0527  TROPONINI <0.30 <0.30 <0.30    TELE Off tele    ECG  NSR with ?Q in V1 and V2, no significant ST-T wave changes  Echocardiogram 10/21/2013  LV EF: 55%  ------------------------------------------------------------------- Indications:   Chest pain 786.51.  ------------------------------------------------------------------- History:  PMH:  Chronic obstructive pulmonary disease. Risk factors: History of pulmonary embolus. Alcohol abuse. NSTEMI. Asthma. Current tobacco use.  ------------------------------------------------------------------- Study Conclusions  - Left ventricle: The cavity size was normal. Wall thickness was normal. The estimated ejection fraction was 55%. Wall motion was normal; there were no regional wall motion abnormalities. - Right ventricle: The cavity size was normal. Systolic function was normal.    Radiology/Studies  Dg Chest Portable 1 View  01/26/2014   CLINICAL DATA:  Shortness of breath; history of asthma and previous pulmonary embolism ; history  of coronary artery disease.  EXAM: PORTABLE CHEST - 1 VIEW  COMPARISON:  PA and lateral chest x-ray of October 20, 2013.  FINDINGS: The lungs are adequately inflated and clear. The heart and mediastinal structures are normal. The pulmonary vascularity is not engorged. There is no pleural effusion or pneumothorax. The bony thorax is unremarkable.  IMPRESSION: There is no acute cardiopulmonary abnormality.   Electronically Signed   By: David  Martinique   On: 01/26/2014 09:52    ASSESSMENT AND PLAN  1. Pleuritic chest pain: stable, negative serial trop, no  ischemic EKG changes  - likely related to COPD exacerbation  2. CAD: Recent NSTEMI in July with revascularization of the proximal & mid D1 with BMS  - Continue Effient and nitrates.  - Echo pending for LV function, likely will be done today, will check with echo lab  3. COPD: Primary issue Lungs still sound bad Continue steroids, antibiotics and nebs  4. DVT/PE: With IVC filter on apixaban   5. Chol: Continue statin   Ok to d/c today if echo cannot be done  Although lungs still are very bronchospastic  Jenkins Rouge

## 2014-01-28 NOTE — Significant Event (Signed)
SATURATION QUALIFICATIONS: (This note is used to comply with regulatory documentation for home oxygen)  Patient Saturations on Room Air at Rest = 98%  Patient Saturations on Room Air while Ambulating = 97%  Patient Saturations on n/a Liters of oxygen while Ambulating = n/a%  Please briefly explain why patient needs home oxygen: does not need O2

## 2014-01-28 NOTE — Progress Notes (Signed)
Echocardiogram 2D Echocardiogram has been performed.  [name] 01/28/2014, 8:49 AM

## 2014-01-30 DIAGNOSIS — I252 Old myocardial infarction: Secondary | ICD-10-CM | POA: Diagnosis not present

## 2014-01-30 DIAGNOSIS — J45909 Unspecified asthma, uncomplicated: Secondary | ICD-10-CM | POA: Diagnosis not present

## 2014-01-30 DIAGNOSIS — E785 Hyperlipidemia, unspecified: Secondary | ICD-10-CM | POA: Diagnosis not present

## 2014-01-30 DIAGNOSIS — J019 Acute sinusitis, unspecified: Secondary | ICD-10-CM | POA: Diagnosis not present

## 2014-01-30 DIAGNOSIS — R5382 Chronic fatigue, unspecified: Secondary | ICD-10-CM | POA: Diagnosis not present

## 2014-03-05 ENCOUNTER — Encounter (HOSPITAL_COMMUNITY): Payer: Self-pay | Admitting: Cardiology

## 2014-03-25 ENCOUNTER — Encounter (HOSPITAL_COMMUNITY): Payer: Self-pay | Admitting: Adult Health

## 2014-03-25 ENCOUNTER — Emergency Department (HOSPITAL_COMMUNITY)
Admission: EM | Admit: 2014-03-25 | Discharge: 2014-03-25 | Disposition: A | Discharge status: Home / Self Care | Payer: Medicare Other | Attending: Emergency Medicine | Admitting: Emergency Medicine

## 2014-03-25 ENCOUNTER — Emergency Department (HOSPITAL_COMMUNITY): Payer: Medicare Other

## 2014-03-25 DIAGNOSIS — Z86711 Personal history of pulmonary embolism: Secondary | ICD-10-CM | POA: Diagnosis not present

## 2014-03-25 DIAGNOSIS — Z8701 Personal history of pneumonia (recurrent): Secondary | ICD-10-CM | POA: Insufficient documentation

## 2014-03-25 DIAGNOSIS — R0602 Shortness of breath: Secondary | ICD-10-CM | POA: Diagnosis present

## 2014-03-25 DIAGNOSIS — Z79899 Other long term (current) drug therapy: Secondary | ICD-10-CM | POA: Insufficient documentation

## 2014-03-25 DIAGNOSIS — Z86718 Personal history of other venous thrombosis and embolism: Secondary | ICD-10-CM | POA: Insufficient documentation

## 2014-03-25 DIAGNOSIS — Z7952 Long term (current) use of systemic steroids: Secondary | ICD-10-CM | POA: Insufficient documentation

## 2014-03-25 DIAGNOSIS — Z8739 Personal history of other diseases of the musculoskeletal system and connective tissue: Secondary | ICD-10-CM | POA: Insufficient documentation

## 2014-03-25 DIAGNOSIS — R059 Cough, unspecified: Secondary | ICD-10-CM

## 2014-03-25 DIAGNOSIS — R05 Cough: Secondary | ICD-10-CM | POA: Insufficient documentation

## 2014-03-25 DIAGNOSIS — I252 Old myocardial infarction: Secondary | ICD-10-CM | POA: Insufficient documentation

## 2014-03-25 DIAGNOSIS — Z7951 Long term (current) use of inhaled steroids: Secondary | ICD-10-CM | POA: Insufficient documentation

## 2014-03-25 DIAGNOSIS — Z720 Tobacco use: Secondary | ICD-10-CM | POA: Diagnosis not present

## 2014-03-25 DIAGNOSIS — Z7902 Long term (current) use of antithrombotics/antiplatelets: Secondary | ICD-10-CM | POA: Diagnosis not present

## 2014-03-25 DIAGNOSIS — I2510 Atherosclerotic heart disease of native coronary artery without angina pectoris: Secondary | ICD-10-CM | POA: Insufficient documentation

## 2014-03-25 DIAGNOSIS — J9809 Other diseases of bronchus, not elsewhere classified: Secondary | ICD-10-CM | POA: Diagnosis not present

## 2014-03-25 DIAGNOSIS — J441 Chronic obstructive pulmonary disease with (acute) exacerbation: Secondary | ICD-10-CM | POA: Diagnosis not present

## 2014-03-25 LAB — BASIC METABOLIC PANEL
Anion gap: 10 (ref 5–15)
BUN: 14 mg/dL (ref 6–23)
CO2: 21 mmol/L (ref 19–32)
Calcium: 9.6 mg/dL (ref 8.4–10.5)
Chloride: 108 mEq/L (ref 96–112)
Creatinine, Ser: 1.04 mg/dL (ref 0.50–1.35)
GFR calc Af Amer: >90 mL/min (ref >90)
GFR calc non Af Amer: 80 mL/min — ABNORMAL LOW (ref >90)
Glucose, Bld: 96 mg/dL (ref 70–99)
Potassium: 3.4 mmol/L — ABNORMAL LOW (ref 3.5–5.1)
Sodium: 139 mmol/L (ref 135–145)

## 2014-03-25 LAB — APTT: aPTT: 25 seconds (ref 24–37)

## 2014-03-25 LAB — I-STAT TROPONIN, ED: Troponin i, poc: 0 ng/mL (ref 0.00–0.08)

## 2014-03-25 LAB — CBC
HCT: 39.2 % (ref 39.0–52.0)
Hemoglobin: 13.1 g/dL (ref 13.0–17.0)
MCH: 30.9 pg (ref 26.0–34.0)
MCHC: 33.4 g/dL (ref 30.0–36.0)
MCV: 92.5 fL (ref 78.0–100.0)
Platelets: 163 10*3/uL (ref 150–400)
RBC: 4.24 MIL/uL (ref 4.22–5.81)
RDW: 13.4 % (ref 11.5–15.5)
WBC: 3.5 10*3/uL — ABNORMAL LOW (ref 4.0–10.5)

## 2014-03-25 LAB — BRAIN NATRIURETIC PEPTIDE: B NATRIURETIC PEPTIDE 5: 30.4 pg/mL (ref 0.0–100.0)

## 2014-03-25 LAB — PROTIME-INR
INR: 0.99 (ref 0.00–1.49)
PROTHROMBIN TIME: 13.2 s (ref 11.6–15.2)

## 2014-03-25 MED ORDER — PREDNISONE 20 MG PO TABS: ORAL_TABLET | ORAL | Status: DC

## 2014-03-25 MED ORDER — AZITHROMYCIN 250 MG PO TABS: 250.0000 mg | ORAL_TABLET | Freq: Every day | ORAL | Status: DC

## 2014-03-25 MED ORDER — ALBUTEROL SULFATE HFA 108 (90 BASE) MCG/ACT IN AERS
2.0000 | INHALATION_SPRAY | Freq: Once | RESPIRATORY_TRACT | Status: AC
  Administered 2014-03-25: 2 via RESPIRATORY_TRACT
  Filled 2014-03-25: qty 6.7

## 2014-03-25 MED ORDER — IPRATROPIUM-ALBUTEROL 0.5-2.5 (3) MG/3ML IN SOLN
3.0000 mL | RESPIRATORY_TRACT | Status: DC
  Administered 2014-03-25: 3 mL via RESPIRATORY_TRACT

## 2014-03-25 MED ORDER — IPRATROPIUM-ALBUTEROL 0.5-2.5 (3) MG/3ML IN SOLN: 3.0000 mL | Freq: Once | RESPIRATORY_TRACT | Status: DC

## 2014-03-25 MED ORDER — IPRATROPIUM-ALBUTEROL 0.5-2.5 (3) MG/3ML IN SOLN
3.0000 mL | RESPIRATORY_TRACT | Status: DC
  Administered 2014-03-25: 3 mL via RESPIRATORY_TRACT
  Filled 2014-03-25 (×2): qty 3

## 2014-03-25 MED ORDER — IOHEXOL 350 MG/ML SOLN
80.0000 mL | Freq: Once | INTRAVENOUS | Status: AC | PRN
  Administered 2014-03-25: 70 mL via INTRAVENOUS

## 2014-03-25 MED ORDER — DEXAMETHASONE SODIUM PHOSPHATE 10 MG/ML IJ SOLN
10.0000 mg | Freq: Once | INTRAMUSCULAR | Status: AC
  Administered 2014-03-25: 10 mg via INTRAVENOUS
  Filled 2014-03-25: qty 1

## 2014-03-25 NOTE — ED Notes (Signed)
Pt stated that he has a hx of blood clots in both legs and in his lungs. Pt states that he stopped taking his medication about a month ago because it was making him tired and he could not even take out the garbage or walk up the stairs.

## 2014-03-25 NOTE — Discharge Instructions (Signed)
Cough, Adult  A cough is a reflex that helps clear your throat and airways. It can help heal the body or may be a reaction to an irritated airway. A cough may only last 2 or 3 weeks (acute) or may last more than 8 weeks (chronic).  CAUSES Acute cough:  Viral or bacterial infections. Chronic cough:  Infections.  Allergies.  Asthma.  Post-nasal drip.  Smoking.  Heartburn or acid reflux.  Some medicines.  Chronic lung problems (COPD).  Cancer. SYMPTOMS   Cough.  Fever.  Chest pain.  Increased breathing rate.  High-pitched whistling sound when breathing (wheezing).  Colored mucus that you cough up (sputum). TREATMENT   A bacterial cough may be treated with antibiotic medicine.  A viral cough must run its course and will not respond to antibiotics.  Your caregiver may recommend other treatments if you have a chronic cough. HOME CARE INSTRUCTIONS   Only take over-the-counter or prescription medicines for pain, discomfort, or fever as directed by your caregiver. Use cough suppressants only as directed by your caregiver.  Use a cold steam vaporizer or humidifier in your bedroom or home to help loosen secretions.  Sleep in a semi-upright position if your cough is worse at night.  Rest as needed.  Stop smoking if you smoke. SEEK IMMEDIATE MEDICAL CARE IF:   You have pus in your sputum.  Your cough starts to worsen.  You cannot control your cough with suppressants and are losing sleep.  You begin coughing up blood.  You have difficulty breathing.  You develop pain which is getting worse or is uncontrolled with medicine.  You have a fever. MAKE SURE YOU:   Understand these instructions.  Will watch your condition.  Will get help right away if you are not doing well or get worse. Document Released: 09/09/2010 Document Revised: 06/05/2011 Document Reviewed: 09/09/2010 [facility] Patient Information 2015 [place], [place]. This information is not intended  to replace advice given to you by your health care provider. Make sure you discuss any questions you have with your health care provider.   It is important for you to follow up with primary care for further evaluation and management of your symptoms. You need to begin taking the medications you were prescribed. Please take your antibiotic, azithromycin as directed until it is gone even if you begin to feel better. Please take your steroids until gone. Use your fast acting inhaler if you become short of breath. Return to ED for worsening symptoms

## 2014-03-25 NOTE — ED Notes (Signed)
Presents with dizziness, SOB, bilateral inspiratory and expiratory wheezes, syncopal episodes and multiple DVTS, he has quit taking his eloquis due it making him feel bad, he has not taken any of his medication other thaan asthma in one month. He used his inhaler today and used the entire inhaler. HR 120-130, tachypneic and has labored respirations, able to speak in full sentences, cough-productive white phlegm.

## 2014-03-25 NOTE — ED Provider Notes (Signed)
CSN: [id number]     Arrival date & time 03/25/14  1735 History   First MD Initiated Contact with Patient 03/25/14 1758     Chief Complaint  Patient presents with  . Shortness of Breath  . Asthma     (Consider location/radiation/quality/duration/timing/severity/associated sxs/prior Treatment) HPI [name] is a 53 y.o. male with known coronary artery disease, MI 7/15, COPD, DVT, PE, IVC filter comes in for evaluation of cough and shortness of breath. Patient states he has had a productive cough for the past 2 weeks with white phlegm. He has been using his albuterol inhaler he says "too much, 10 times a day" and has now run out. She also reports that for the past month he has not taken any of his prescribed medications and an experiment to see if he feels better, which he reports he did. Reports increased fatigue with walking and has associated chest discomfort. Denies numbness or weakness, abdominal pain, headaches, fevers, syncope. Current every day smoker.  Past Medical History  Diagnosis Date  . Asthma   . DVT (deep venous thrombosis)     "I had ~ 10 in each leg"  . Pulmonary embolism X 1  . CAD (coronary artery disease) 10/21/13    BMS to D1  . COPD (chronic obstructive pulmonary disease)   . Myocardial infarction 09/2013  . Pneumonia X 2  . Sinus headache   . Arthritis     "hands and toes" (01/26/2014)   Past Surgical History  Procedure Laterality Date  . Vena cava filter placement  " ~ 2012  . Coronary angioplasty with stent placement  10/21/13    BMS to D1  . Left heart catheterization with coronary angiogram N/A 10/21/2013    Procedure: LEFT HEART CATHETERIZATION WITH CORONARY ANGIOGRAM;  Surgeon: Leonie Man, MD;  Location: [facility] CATH LAB;  Service: Cardiovascular;  Laterality: N/A;   Family History  Problem Relation Age of Onset  . Asthma Mother   . Allergies Mother   . Allergies Sister   . Allergies Brother   . Deep vein thrombosis Brother     two brothers  with recurrent DVT   History  Substance Use Topics  . Smoking status: Current Every Day Smoker -- 0.25 packs/day for 38 years    Types: Cigarettes  . Smokeless tobacco: Never Used  . Alcohol Use: 3.6 oz/week    6 Cans of beer per week     Comment: 01/26/2014 "drink  2 40oz beers weekly"    Review of Systems A 10 point review of systems was completed and was negative except for pertinent positives and negatives as mentioned in the history of present illness     Allergies  Review of patient's allergies indicates no known allergies.  Home Medications   Prior to Admission medications   Medication Sig Start Date End Date Taking? Authorizing Provider  albuterol (PROVENTIL HFA;VENTOLIN HFA) 108 (90 BASE) MCG/ACT inhaler Inhale 2 puffs into the lungs every 6 (six) hours as needed for wheezing or shortness of breath. 03/28/13   Orson Eva, MD  apixaban (ELIQUIS) 2.5 MG TABS tablet Take 1 tablet (2.5 mg total) by mouth 2 (two) times daily. 10/27/13   Thurnell Lose, MD  atorvastatin (LIPITOR) 80 MG tablet Take 1 tablet (80 mg total) by mouth daily at 6 PM. 10/27/13   Thurnell Lose, MD  azithromycin (ZITHROMAX) 250 MG tablet Take 1 tablet (250 mg total) by mouth daily. Take first 2 tablets together, then 1 every  day until finished. 03/25/14   Viona Gilmore Adolphe Fortunato, PA-C  azithromycin (ZITHROMAX) 500 MG tablet Take 1 tablet (500 mg total) by mouth daily. 01/28/14   Debbe Odea, MD  Fluticasone-Salmeterol (ADVAIR DISKUS) 250-50 MCG/DOSE AEPB Inhale 1 puff into the lungs 2 (two) times daily. 06/20/13   Nishant Dhungel, MD  isosorbide mononitrate (IMDUR) 30 MG 24 hr tablet Take 1 tablet (30 mg total) by mouth daily. 10/27/13   Thurnell Lose, MD  metoprolol tartrate (LOPRESSOR) 12.5 mg TABS tablet Take 0.5 tablets (12.5 mg total) by mouth 2 (two) times daily. 10/27/13   Thurnell Lose, MD  nitroGLYCERIN (NITROSTAT) 0.4 MG SL tablet Place 1 tablet (0.4 mg total) under the tongue every 5 (five) minutes as  needed for chest pain (CP or SOB). 10/27/13   Thurnell Lose, MD  prasugrel (EFFIENT) 10 MG TABS tablet Take 1 tablet (10 mg total) by mouth daily. 10/27/13   Thurnell Lose, MD  predniSONE (DELTASONE) 20 MG tablet Take 2 tablets (40 mg total) by mouth daily with breakfast. 01/29/14   Debbe Odea, MD  predniSONE (DELTASONE) 20 MG tablet 3 tabs po day one, then 2 tabs daily x 4 days 03/25/14   Viona Gilmore Iszabella Hebenstreit, PA-C  tiotropium (SPIRIVA) 18 MCG inhalation capsule Place 18 mcg into inhaler and inhale daily.    Historical Provider, MD   BP 135/83 mmHg  Pulse 96  Temp(Src) 98.3 F (36.8 C) (Oral)  Resp 13  SpO2 99% Physical Exam  Constitutional: He is oriented to person, place, and time. He appears well-developed and well-nourished.  HENT:  Head: Normocephalic and atraumatic.  Mouth/Throat: Oropharynx is clear and moist.  Eyes: Conjunctivae are normal. Pupils are equal, round, and reactive to light. Right eye exhibits no discharge. Left eye exhibits no discharge. No scleral icterus.  Neck: Neck supple.  Cardiovascular: Normal rate, regular rhythm and normal heart sounds.   Pulmonary/Chest: Effort normal and breath sounds normal.  Labored breathing, speaking in full sentences. No tachypnea. Diffuse expiratory wheezing with adventitious lung sounds in all fields.  Abdominal: Soft. There is no tenderness.  Musculoskeletal: He exhibits no tenderness.  Neurological: He is alert and oriented to person, place, and time.  Cranial Nerves II-XII grossly intact  Skin: Skin is warm and dry. No rash noted.  Psychiatric: He has a normal mood and affect.  Nursing note and vitals reviewed.   ED Course  Procedures (including critical care time) Labs Review Labs Reviewed  CBC - Abnormal; Notable for the following:    WBC 3.5 (*)    All other components within normal limits  BASIC METABOLIC PANEL - Abnormal; Notable for the following:    Potassium 3.4 (*)    GFR calc non Af Amer 80 (*)    All  other components within normal limits  PROTIME-INR  APTT  BRAIN NATRIURETIC PEPTIDE  I-STAT TROPOININ, ED  I-STAT TROPOININ, ED    Imaging Review Dg Chest 2 View (if Patient Has Fever And/or Copd)  03/25/2014   CLINICAL DATA:  Cough and shortness of breath.  History of asthma.  EXAM: CHEST  2 VIEW  COMPARISON:  Prior radiograph from 01/26/2014  FINDINGS: Cardiac and mediastinal silhouettes are stable in size and contour, and remain within normal limits. Coronary artery stent noted.  Lung volumes within normal limits. There is mild diffuse peribronchial thickening, which may be related history of asthma. No consolidative airspace opacity. No pulmonary edema or pleural effusion. No pneumothorax.  Scattered multilevel degenerative changes  noted within the visualized spine. No acute osseus abnormality.  IMPRESSION: Mild diffuse peribronchial thickening, which may be related to provided history of asthma. No other active cardiopulmonary disease.   Electronically Signed   By: Jeannine Boga M.D.   On: 03/25/2014 18:59   Ct Angio Chest Pe W/cm &/or Wo Cm  03/25/2014   CLINICAL DATA:  Presents with dizziness, SOB, bilateral inspiratory and expiratory wheezes, syncopal episodes and multiple DVTS, he has quit taking his eloquis due it making him feel bad, he has not taken any of his medication other thaan asthma in one month. He used his inhaler today and used the entire inhaler. HR 120-130, tachypneic and has labored respirations, able to speak in full sentences, cough-productive white phlegm.  EXAM: CT ANGIOGRAPHY CHEST WITH CONTRAST  TECHNIQUE: Multidetector CT imaging of the chest was performed using the standard protocol during bolus administration of intravenous contrast. Multiplanar CT image reconstructions and MIPs were obtained to evaluate the vascular anatomy.  CONTRAST:  97mL OMNIPAQUE IOHEXOL 350 MG/ML SOLN  COMPARISON:  Chest radiograph of earlier in the day. Chest CT of 10/20/2013.   FINDINGS: Lungs/Pleura: Minimal motion degradation. Progression above bronchial wall thickening which is slightly upper lobe predominant. No lobar consolidation.  Heart/Mediastinum: The quality of this examination for evaluation of pulmonary embolism is good. No evidence of pulmonary embolism.  Aortic and branch vessel atherosclerosis. Normal heart size, without pericardial effusion. Multivessel coronary artery atherosclerosis. No mediastinal or hilar adenopathy. Presumed sebaceous cyst about the right paraspinous region at 9 mm on image 63.  Upper Abdomen: Normal imaged portions of the liver, spleen, stomach, pancreas, gallbladder, biliary tract, adrenal glands, kidneys.  Bones/Musculoskeletal:  No acute osseous abnormality.  Review of the MIP images confirms the above findings.  IMPRESSION: No evidence of pulmonary embolism.  Progression Of moderate to marked bronchial wall thickening, likely related to acute on chronic bronchitis.  Age advanced coronary artery atherosclerosis. Recommend assessment of coronary risk factors and consideration of medical therapy.   Electronically Signed   By: Abigail Miyamoto M.D.   On: 03/25/2014 19:34     EKG Interpretation None       Date: 03/25/2014  Rate: 94  Rhythm: normal sinus rhythm  QRS Axis: normal  Intervals: QT prolonged  ST/T Wave abnormalities: normal  Conduction Disutrbances:none  Narrative Interpretation:   Old EKG Reviewed: none available   Meds given in ED:  Medications  ipratropium-albuterol (DUONEB) 0.5-2.5 (3) MG/3ML nebulizer solution 3 mL (3 mLs Nebulization Given 03/25/14 1943)  ipratropium-albuterol (DUONEB) 0.5-2.5 (3) MG/3ML nebulizer solution 3 mL (3 mLs Nebulization Given 03/25/14 2018)  iohexol (OMNIPAQUE) 350 MG/ML injection 80 mL (70 mLs Intravenous Contrast Given 03/25/14 1915)  dexamethasone (DECADRON) injection 10 mg (10 mg Intravenous Given 03/25/14 2016)  albuterol (PROVENTIL HFA;VENTOLIN HFA) 108 (90 BASE) MCG/ACT inhaler 2  puff (2 puffs Inhalation Given 03/25/14 2018)    New Prescriptions   AZITHROMYCIN (ZITHROMAX) 250 MG TABLET    Take 1 tablet (250 mg total) by mouth daily. Take first 2 tablets together, then 1 every day until finished.   PREDNISONE (DELTASONE) 20 MG TABLET    3 tabs po day one, then 2 tabs daily x 4 days   Filed Vitals:   03/25/14 1945 03/25/14 2015 03/25/14 2030 03/25/14 2038  BP: 131/78 130/78 133/82 135/83  Pulse: 80 79 79 96  Temp:      TempSrc:      Resp: 16 16 16 13   SpO2: 96% 97% 98% 99%  MDM  Gokul Waybright is a 53 y.o. male with multiple medical problems comes in for evaluation of a 2 week history of cough.  Vitals stable - WNL -afebrile Pt resting comfortably in ED. PE--patient with moderate wheezing, but is able to carry on conversation, maintains oxygen saturations greater than 96%, no tachypnea. No evidence of respiratory distress. States he feels better and prefers to go home to follow-up with his primary care. Labwork noncontributory Imaging--chest x-ray shows diffuse peribronchial thickening consistent with acute on chronic bronchitis. CT angiogram shows no evidence of pulmonary embolism.  DDX--patient treated with DuoNeb 2 in the ED as well as Decadron. He will need to be treated with antibiotics for his COPD exacerbation, continue with steroids, he was given another inhaler in the ED. Patient has good primary care follow-up and will be able to make an appointment within the next week. Discuss smoking cessation at length.  Discussed f/u with PCP and return precautions, pt very amenable to plan. Patient stable, in good condition and is appropriate for discharge Prior to patient discharge, I discussed and reviewed this case with Dr. Wilson Singer, who also saw and evaluated the patient     Final diagnoses:  Cough        [name] COINER, PA-C 03/25/14 2127  Virgel Manifold, MD 03/26/14 1407

## 2014-04-27 ENCOUNTER — Inpatient Hospital Stay (HOSPITAL_COMMUNITY)
Admission: EM | Admit: 2014-04-27 | Discharge: 2014-05-01 | DRG: 191 | Disposition: A | Discharge status: Home / Self Care | Payer: Medicare Other | Attending: Internal Medicine | Admitting: Internal Medicine

## 2014-04-27 ENCOUNTER — Emergency Department (HOSPITAL_COMMUNITY): Payer: Medicare Other

## 2014-04-27 ENCOUNTER — Encounter (HOSPITAL_COMMUNITY): Payer: Self-pay | Admitting: Emergency Medicine

## 2014-04-27 DIAGNOSIS — Z7952 Long term (current) use of systemic steroids: Secondary | ICD-10-CM | POA: Diagnosis not present

## 2014-04-27 DIAGNOSIS — F17210 Nicotine dependence, cigarettes, uncomplicated: Secondary | ICD-10-CM | POA: Diagnosis present

## 2014-04-27 DIAGNOSIS — Z86718 Personal history of other venous thrombosis and embolism: Secondary | ICD-10-CM

## 2014-04-27 DIAGNOSIS — Z720 Tobacco use: Secondary | ICD-10-CM | POA: Diagnosis not present

## 2014-04-27 DIAGNOSIS — Z86711 Personal history of pulmonary embolism: Secondary | ICD-10-CM | POA: Diagnosis not present

## 2014-04-27 DIAGNOSIS — I252 Old myocardial infarction: Secondary | ICD-10-CM

## 2014-04-27 DIAGNOSIS — I2510 Atherosclerotic heart disease of native coronary artery without angina pectoris: Secondary | ICD-10-CM | POA: Diagnosis present

## 2014-04-27 DIAGNOSIS — M1389 Other specified arthritis, multiple sites: Secondary | ICD-10-CM | POA: Diagnosis present

## 2014-04-27 DIAGNOSIS — Z9889 Other specified postprocedural states: Secondary | ICD-10-CM | POA: Diagnosis not present

## 2014-04-27 DIAGNOSIS — I82409 Acute embolism and thrombosis of unspecified deep veins of unspecified lower extremity: Secondary | ICD-10-CM

## 2014-04-27 DIAGNOSIS — Z79899 Other long term (current) drug therapy: Secondary | ICD-10-CM | POA: Diagnosis not present

## 2014-04-27 DIAGNOSIS — Z91199 Patient's noncompliance with other medical treatment and regimen due to unspecified reason: Secondary | ICD-10-CM

## 2014-04-27 DIAGNOSIS — R0603 Acute respiratory distress: Secondary | ICD-10-CM

## 2014-04-27 DIAGNOSIS — J441 Chronic obstructive pulmonary disease with (acute) exacerbation: Principal | ICD-10-CM | POA: Diagnosis present

## 2014-04-27 DIAGNOSIS — Z9119 Patient's noncompliance with other medical treatment and regimen: Secondary | ICD-10-CM | POA: Diagnosis present

## 2014-04-27 DIAGNOSIS — I25119 Atherosclerotic heart disease of native coronary artery with unspecified angina pectoris: Secondary | ICD-10-CM | POA: Insufficient documentation

## 2014-04-27 DIAGNOSIS — J449 Chronic obstructive pulmonary disease, unspecified: Secondary | ICD-10-CM | POA: Diagnosis not present

## 2014-04-27 DIAGNOSIS — J45909 Unspecified asthma, uncomplicated: Secondary | ICD-10-CM | POA: Diagnosis present

## 2014-04-27 DIAGNOSIS — J80 Acute respiratory distress syndrome: Secondary | ICD-10-CM | POA: Diagnosis present

## 2014-04-27 DIAGNOSIS — D61818 Other pancytopenia: Secondary | ICD-10-CM | POA: Diagnosis not present

## 2014-04-27 DIAGNOSIS — R0602 Shortness of breath: Secondary | ICD-10-CM | POA: Diagnosis not present

## 2014-04-27 DIAGNOSIS — I10 Essential (primary) hypertension: Secondary | ICD-10-CM | POA: Diagnosis not present

## 2014-04-27 DIAGNOSIS — Z955 Presence of coronary angioplasty implant and graft: Secondary | ICD-10-CM

## 2014-04-27 DIAGNOSIS — Z7901 Long term (current) use of anticoagulants: Secondary | ICD-10-CM

## 2014-04-27 DIAGNOSIS — R079 Chest pain, unspecified: Secondary | ICD-10-CM | POA: Diagnosis present

## 2014-04-27 DIAGNOSIS — Z8701 Personal history of pneumonia (recurrent): Secondary | ICD-10-CM | POA: Diagnosis not present

## 2014-04-27 DIAGNOSIS — R069 Unspecified abnormalities of breathing: Secondary | ICD-10-CM | POA: Diagnosis not present

## 2014-04-27 DIAGNOSIS — J45901 Unspecified asthma with (acute) exacerbation: Secondary | ICD-10-CM | POA: Diagnosis not present

## 2014-04-27 DIAGNOSIS — J40 Bronchitis, not specified as acute or chronic: Secondary | ICD-10-CM | POA: Diagnosis not present

## 2014-04-27 LAB — CBC
HCT: 36.3 % — ABNORMAL LOW (ref 39.0–52.0)
Hemoglobin: 12.4 g/dL — ABNORMAL LOW (ref 13.0–17.0)
MCH: 30.9 pg (ref 26.0–34.0)
MCHC: 34.2 g/dL (ref 30.0–36.0)
MCV: 90.5 fL (ref 78.0–100.0)
Platelets: 125 10*3/uL — ABNORMAL LOW (ref 150–400)
RBC: 4.01 MIL/uL — AB (ref 4.22–5.81)
RDW: 12.4 % (ref 11.5–15.5)
WBC: 3.7 10*3/uL — AB (ref 4.0–10.5)

## 2014-04-27 LAB — GLUCOSE, CAPILLARY
GLUCOSE-CAPILLARY: 198 mg/dL — AB (ref 70–99)
Glucose-Capillary: 177 mg/dL — ABNORMAL HIGH (ref 70–99)
Glucose-Capillary: 166 mg/dL — ABNORMAL HIGH (ref 70–99)

## 2014-04-27 LAB — BASIC METABOLIC PANEL
ANION GAP: 6 (ref 5–15)
BUN: 15 mg/dL (ref 6–23)
CO2: 23 mmol/L (ref 19–32)
Calcium: 9.1 mg/dL (ref 8.4–10.5)
Chloride: 111 mmol/L (ref 96–112)
Creatinine, Ser: 0.82 mg/dL (ref 0.50–1.35)
GFR calc Af Amer: >90 mL/min (ref >90)
Glucose, Bld: 105 mg/dL — ABNORMAL HIGH (ref 70–99)
POTASSIUM: 3.9 mmol/L (ref 3.5–5.1)
Sodium: 140 mmol/L (ref 135–145)

## 2014-04-27 LAB — TROPONIN I
Troponin I: <0.03 ng/mL (ref <0.031)
Troponin I: <0.03 ng/mL (ref <0.031)

## 2014-04-27 LAB — I-STAT TROPONIN, ED: Troponin i, poc: 0 ng/mL (ref 0.00–0.08)

## 2014-04-27 MED ORDER — MORPHINE SULFATE 2 MG/ML IJ SOLN: 2.0000 mg | INTRAMUSCULAR | Status: DC | PRN

## 2014-04-27 MED ORDER — NITROGLYCERIN 0.4 MG SL SUBL: 0.4000 mg | SUBLINGUAL_TABLET | SUBLINGUAL | Status: DC | PRN

## 2014-04-27 MED ORDER — IPRATROPIUM-ALBUTEROL 0.5-2.5 (3) MG/3ML IN SOLN
3.0000 mL | RESPIRATORY_TRACT | Status: DC | PRN
  Administered 2014-04-27 – 2014-04-28 (×2): 3 mL via RESPIRATORY_TRACT
  Filled 2014-04-27 (×2): qty 3

## 2014-04-27 MED ORDER — PRASUGREL HCL 10 MG PO TABS
10.0000 mg | ORAL_TABLET | Freq: Every day | ORAL | Status: DC
  Administered 2014-04-27 – 2014-05-01 (×5): 10 mg via ORAL
  Filled 2014-04-27 (×5): qty 1

## 2014-04-27 MED ORDER — ALUM & MAG HYDROXIDE-SIMETH 200-200-20 MG/5ML PO SUSP: 30.0000 mL | Freq: Four times a day (QID) | ORAL | Status: DC | PRN

## 2014-04-27 MED ORDER — APIXABAN 2.5 MG PO TABS
2.5000 mg | ORAL_TABLET | Freq: Two times a day (BID) | ORAL | Status: DC
  Administered 2014-04-27 – 2014-05-01 (×9): 2.5 mg via ORAL
  Filled 2014-04-27 (×9): qty 1

## 2014-04-27 MED ORDER — IPRATROPIUM BROMIDE 0.02 % IN SOLN
0.5000 mg | Freq: Once | RESPIRATORY_TRACT | Status: AC
  Administered 2014-04-27: 0.5 mg via RESPIRATORY_TRACT
  Filled 2014-04-27: qty 2.5

## 2014-04-27 MED ORDER — ACETAMINOPHEN 650 MG RE SUPP: 650.0000 mg | Freq: Four times a day (QID) | RECTAL | Status: DC | PRN

## 2014-04-27 MED ORDER — MORPHINE SULFATE 2 MG/ML IJ SOLN
2.0000 mg | INTRAMUSCULAR | Status: DC | PRN
  Administered 2014-04-27 – 2014-05-01 (×10): 2 mg via INTRAVENOUS
  Filled 2014-04-27 (×10): qty 1

## 2014-04-27 MED ORDER — ONDANSETRON HCL 4 MG PO TABS: 4.0000 mg | ORAL_TABLET | Freq: Four times a day (QID) | ORAL | Status: DC | PRN

## 2014-04-27 MED ORDER — SODIUM CHLORIDE 0.9 % IJ SOLN
3.0000 mL | Freq: Two times a day (BID) | INTRAMUSCULAR | Status: DC
Start: 2014-04-27 — End: 2014-05-01
  Administered 2014-04-27 – 2014-05-01 (×8): 3 mL via INTRAVENOUS

## 2014-04-27 MED ORDER — IPRATROPIUM-ALBUTEROL 0.5-2.5 (3) MG/3ML IN SOLN
3.0000 mL | RESPIRATORY_TRACT | Status: DC
Start: 2014-04-27 — End: 2014-04-28
  Administered 2014-04-27 (×4): 3 mL via RESPIRATORY_TRACT
  Filled 2014-04-27 (×4): qty 3

## 2014-04-27 MED ORDER — HYDROCODONE-ACETAMINOPHEN 5-325 MG PO TABS
1.0000 | ORAL_TABLET | ORAL | Status: DC | PRN
  Administered 2014-04-28 (×2): 2 via ORAL
  Administered 2014-04-29 (×2): 1 via ORAL
  Administered 2014-04-30 – 2014-05-01 (×3): 2 via ORAL
  Filled 2014-04-27 (×2): qty 2
  Filled 2014-04-27: qty 1
  Filled 2014-04-27 (×3): qty 2
  Filled 2014-04-27: qty 1

## 2014-04-27 MED ORDER — METHYLPREDNISOLONE SODIUM SUCC 125 MG IJ SOLR
125.0000 mg | Freq: Once | INTRAMUSCULAR | Status: AC
  Administered 2014-04-27: 125 mg via INTRAVENOUS
  Filled 2014-04-27: qty 2

## 2014-04-27 MED ORDER — SODIUM CHLORIDE 0.9 % IJ SOLN: 3.0000 mL | INTRAMUSCULAR | Status: DC | PRN

## 2014-04-27 MED ORDER — INSULIN ASPART 100 UNIT/ML ~~LOC~~ SOLN
0.0000 [IU] | Freq: Three times a day (TID) | SUBCUTANEOUS | Status: DC
  Administered 2014-04-27: 2 [IU] via SUBCUTANEOUS
  Administered 2014-04-28: 1 [IU] via SUBCUTANEOUS
  Administered 2014-04-28 – 2014-04-29 (×4): 2 [IU] via SUBCUTANEOUS
  Administered 2014-04-30: 1 [IU] via SUBCUTANEOUS
  Administered 2014-04-30: 2 [IU] via SUBCUTANEOUS
  Administered 2014-04-30 – 2014-05-01 (×2): 1 [IU] via SUBCUTANEOUS

## 2014-04-27 MED ORDER — ACETAMINOPHEN 325 MG PO TABS
650.0000 mg | ORAL_TABLET | Freq: Four times a day (QID) | ORAL | Status: DC | PRN
  Administered 2014-04-29: 650 mg via ORAL
  Filled 2014-04-27: qty 2

## 2014-04-27 MED ORDER — METOPROLOL TARTRATE 12.5 MG HALF TABLET
12.5000 mg | ORAL_TABLET | Freq: Two times a day (BID) | ORAL | Status: DC
  Administered 2014-04-27 – 2014-05-01 (×9): 12.5 mg via ORAL
  Filled 2014-04-27 (×9): qty 1

## 2014-04-27 MED ORDER — SODIUM CHLORIDE 0.9 % IV SOLN
250.0000 mL | INTRAVENOUS | Status: DC | PRN
  Administered 2014-04-29: 250 mL via INTRAVENOUS

## 2014-04-27 MED ORDER — ONDANSETRON HCL 4 MG/2ML IJ SOLN: 4.0000 mg | Freq: Four times a day (QID) | INTRAMUSCULAR | Status: DC | PRN

## 2014-04-27 MED ORDER — ATORVASTATIN CALCIUM 80 MG PO TABS
80.0000 mg | ORAL_TABLET | Freq: Every day | ORAL | Status: DC
  Administered 2014-04-27 – 2014-04-30 (×4): 80 mg via ORAL
  Filled 2014-04-27 (×4): qty 1

## 2014-04-27 MED ORDER — HYDRALAZINE HCL 20 MG/ML IJ SOLN: 10.0000 mg | Freq: Four times a day (QID) | INTRAMUSCULAR | Status: DC | PRN

## 2014-04-27 MED ORDER — DOCUSATE SODIUM 100 MG PO CAPS
100.0000 mg | ORAL_CAPSULE | Freq: Every day | ORAL | Status: DC
  Administered 2014-04-28 – 2014-05-01 (×4): 100 mg via ORAL
  Filled 2014-04-27 (×4): qty 1

## 2014-04-27 MED ORDER — METHYLPREDNISOLONE SODIUM SUCC 125 MG IJ SOLR
60.0000 mg | Freq: Three times a day (TID) | INTRAMUSCULAR | Status: DC
  Administered 2014-04-27 – 2014-05-01 (×12): 60 mg via INTRAVENOUS
  Filled 2014-04-27 (×12): qty 2

## 2014-04-27 MED ORDER — ALBUTEROL (5 MG/ML) CONTINUOUS INHALATION SOLN
20.0000 mg/h | INHALATION_SOLUTION | Freq: Once | RESPIRATORY_TRACT | Status: AC
  Administered 2014-04-27: 20 mg/h via RESPIRATORY_TRACT
  Filled 2014-04-27: qty 20

## 2014-04-27 MED ORDER — PANTOPRAZOLE SODIUM 40 MG PO TBEC
40.0000 mg | DELAYED_RELEASE_TABLET | Freq: Every day | ORAL | Status: DC
Start: 2014-04-27 — End: 2014-05-01
  Administered 2014-04-27 – 2014-05-01 (×5): 40 mg via ORAL
  Filled 2014-04-27 (×5): qty 1

## 2014-04-27 MED ORDER — LEVOFLOXACIN IN D5W 500 MG/100ML IV SOLN
500.0000 mg | INTRAVENOUS | Status: DC
  Administered 2014-04-27 – 2014-04-29 (×3): 500 mg via INTRAVENOUS
  Filled 2014-04-27 (×4): qty 100

## 2014-04-27 MED ORDER — ALBUTEROL SULFATE (2.5 MG/3ML) 0.083% IN NEBU
5.0000 mg | INHALATION_SOLUTION | Freq: Once | RESPIRATORY_TRACT | Status: AC
  Administered 2014-04-27: 5 mg via RESPIRATORY_TRACT
  Filled 2014-04-27: qty 6

## 2014-04-27 NOTE — ED Notes (Signed)
EMS - Patient coming from home with c.o of shortness of breath x 3 days.  Patient given 5mg  Albuterol and 0.5 Atrovent with EMS.

## 2014-04-27 NOTE — ED Provider Notes (Signed)
CSN: [id number]     Arrival date & time 04/27/14  0702 History   First MD Initiated Contact with Patient 04/27/14 0719     Chief Complaint  Patient presents with  . Shortness of Breath     (Consider location/radiation/quality/duration/timing/severity/associated sxs/prior Treatment) HPI Comments: 54 year old male with a past medical history of MI, COPD, CAD, PE, DVT and asthma presenting to the ED via EMS complaining of shortness of breath 3 days. Patient reports this feels like his asthma exacerbations. He states he's been using his albuterol inhaler 15 times daily with no relief. Shortness of breath both at rest and on exertion. Admits to wheezing, and productive cough with "spit". Denies fevers, however states he gets "hot and cold". Admits to chest tightness, however denies chest pain. No nausea or vomiting.  The history is provided by the patient.    Past Medical History  Diagnosis Date  . Asthma   . DVT (deep venous thrombosis)     "I had ~ 10 in each leg"  . Pulmonary embolism X 1  . CAD (coronary artery disease) 10/21/13    BMS to D1  . COPD (chronic obstructive pulmonary disease)   . Myocardial infarction 09/2013  . Pneumonia X 2  . Sinus headache   . Arthritis     "hands and toes" (01/26/2014)   Past Surgical History  Procedure Laterality Date  . Vena cava filter placement  " ~ 2012  . Coronary angioplasty with stent placement  10/21/13    BMS to D1  . Left heart catheterization with coronary angiogram N/A 10/21/2013    Procedure: LEFT HEART CATHETERIZATION WITH CORONARY ANGIOGRAM;  Surgeon: Leonie Man, MD;  Location: [facility] CATH LAB;  Service: Cardiovascular;  Laterality: N/A;   Family History  Problem Relation Age of Onset  . Asthma Mother   . Allergies Mother   . Allergies Sister   . Allergies Brother   . Deep vein thrombosis Brother     two brothers with recurrent DVT   History  Substance Use Topics  . Smoking status: Current Every Day Smoker -- 0.25  packs/day for 38 years    Types: Cigarettes  . Smokeless tobacco: Never Used  . Alcohol Use: 3.6 oz/week    6 Cans of beer per week     Comment: 01/26/2014 "drink  2 40oz beers weekly"    Review of Systems  10 Systems reviewed and are negative for acute change except as noted in the HPI.  Allergies  Review of patient's allergies indicates no known allergies.  Home Medications   Prior to Admission medications   Medication Sig Start Date End Date Taking? Authorizing Provider  albuterol (PROVENTIL HFA;VENTOLIN HFA) 108 (90 BASE) MCG/ACT inhaler Inhale 2 puffs into the lungs every 6 (six) hours as needed for wheezing or shortness of breath. 03/28/13  Yes Orson Eva, MD  apixaban (ELIQUIS) 2.5 MG TABS tablet Take 1 tablet (2.5 mg total) by mouth 2 (two) times daily. 10/27/13  Yes Thurnell Lose, MD  Fluticasone-Salmeterol (ADVAIR DISKUS) 250-50 MCG/DOSE AEPB Inhale 1 puff into the lungs 2 (two) times daily. 06/20/13  Yes Nishant Dhungel, MD  tiotropium (SPIRIVA) 18 MCG inhalation capsule Place 18 mcg into inhaler and inhale daily.   Yes Historical Provider, MD  atorvastatin (LIPITOR) 80 MG tablet Take 1 tablet (80 mg total) by mouth daily at 6 PM. Patient not taking: Reported on 04/27/2014 10/27/13   Thurnell Lose, MD  azithromycin (ZITHROMAX) 250 MG tablet Take 1  tablet (250 mg total) by mouth daily. Take first 2 tablets together, then 1 every day until finished. Patient not taking: Reported on 04/27/2014 03/25/14   Viona Gilmore Cartner, PA-C  azithromycin (ZITHROMAX) 500 MG tablet Take 1 tablet (500 mg total) by mouth daily. Patient not taking: Reported on 04/27/2014 01/28/14   Debbe Odea, MD  isosorbide mononitrate (IMDUR) 30 MG 24 hr tablet Take 1 tablet (30 mg total) by mouth daily. Patient not taking: Reported on 04/27/2014 10/27/13   Thurnell Lose, MD  metoprolol tartrate (LOPRESSOR) 12.5 mg TABS tablet Take 0.5 tablets (12.5 mg total) by mouth 2 (two) times daily. Patient not taking: Reported  on 04/27/2014 10/27/13   Thurnell Lose, MD  nitroGLYCERIN (NITROSTAT) 0.4 MG SL tablet Place 1 tablet (0.4 mg total) under the tongue every 5 (five) minutes as needed for chest pain (CP or SOB). 10/27/13   Thurnell Lose, MD  prasugrel (EFFIENT) 10 MG TABS tablet Take 1 tablet (10 mg total) by mouth daily. Patient not taking: Reported on 04/27/2014 10/27/13   Thurnell Lose, MD  predniSONE (DELTASONE) 20 MG tablet Take 2 tablets (40 mg total) by mouth daily with breakfast. Patient not taking: Reported on 04/27/2014 01/29/14   Debbe Odea, MD  predniSONE (DELTASONE) 20 MG tablet 3 tabs po day one, then 2 tabs daily x 4 days Patient not taking: Reported on 04/27/2014 03/25/14   Viona Gilmore Cartner, PA-C   BP 161/138 mmHg  Pulse 77  Temp(Src) 98.2 F (36.8 C) (Oral)  Resp 32  Ht 6\' 2"  (1.88 m)  Wt 180 lb (81.647 kg)  BMI 23.10 kg/m2  SpO2 97% Physical Exam  Constitutional: He is oriented to person, place, and time. He appears well-developed and well-nourished. No distress.  HENT:  Head: Normocephalic and atraumatic.  Mouth/Throat: Oropharynx is clear and moist.  Eyes: Conjunctivae and EOM are normal. Pupils are equal, round, and reactive to light.  Neck: Normal range of motion. Neck supple. No JVD present.  Cardiovascular: Normal rate, regular rhythm, normal heart sounds and intact distal pulses.   No extremity edema.  Pulmonary/Chest: Effort normal. No respiratory distress.  Poor air movement. Diffuse ispiratory and expiratory wheezes bilateral.  Abdominal: Soft. Bowel sounds are normal. There is no tenderness.  Musculoskeletal: Normal range of motion. He exhibits no edema.  Neurological: He is alert and oriented to person, place, and time. He has normal strength. No sensory deficit.  Speech fluent, goal oriented. Moves limbs without ataxia. Equal grip strength bilateral.  Skin: Skin is warm and dry. He is not diaphoretic.  Psychiatric: He has a normal mood and affect. His behavior is  normal.  Nursing note and vitals reviewed.   ED Course  Procedures (including critical care time) CRITICAL CARE Performed by: Lucien Mons   Total critical care time: 35 minutes  Critical care time was exclusive of separately billable procedures and treating other patients.  Critical care was necessary to treat or prevent imminent or life-threatening deterioration.  Critical care was time spent personally by me on the following activities: development of treatment plan with patient and/or surrogate as well as nursing, discussions with consultants, evaluation of patient's response to treatment, examination of patient, obtaining history from patient or surrogate, ordering and performing treatments and interventions, ordering and review of laboratory studies, ordering and review of radiographic studies, pulse oximetry and re-evaluation of patient's condition.  Labs Review Labs Reviewed  CBC - Abnormal; Notable for the following:    WBC 3.7 (*)  RBC 4.01 (*)    Hemoglobin 12.4 (*)    HCT 36.3 (*)    Platelets 125 (*)    All other components within normal limits  BASIC METABOLIC PANEL - Abnormal; Notable for the following:    Glucose, Bld 105 (*)    All other components within normal limits  I-STAT TROPOININ, ED    Imaging Review Dg Chest 2 View  04/27/2014   CLINICAL DATA:  53 year old male with chest pain, shortness of breath and cough  EXAM: CHEST  2 VIEW  COMPARISON:  Prior chest x-ray and chest CT 03/25/2014  FINDINGS: Cardiac and mediastinal contours are within normal limits. No focal airspace consolidation. No evidence of pneumothorax, pleural effusion or other acute abnormality. Central bronchitic changes are again noted diffusely. No significant interval progression compared to the recent prior imaging. No acute osseous abnormality.  IMPRESSION: Stable chest x-ray without evidence of acute cardiopulmonary process.   Electronically Signed   By: Jacqulynn Cadet M.D.   On:  04/27/2014 08:00     EKG Interpretation   Date/Time:  Monday April 27 2014 07:07:30 EST Ventricular Rate:  80 PR Interval:  175 QRS Duration: 91 QT Interval:  416 QTC Calculation: 480 R Axis:   32 Text Interpretation:  Sinus rhythm Borderline prolonged QT interval Since  last tracing QT has shortened Confirmed by Eulis Foster  MD, ELLIOTT ([id number]) on  04/27/2014 7:58:12 AM      MDM   Final diagnoses:  Asthma exacerbation   Patient nontoxic appearing and in no apparent distress. Diffuse wheezes with poor air movement on initial exam. O2 sat 95% on room air. Labs, chest x-ray without acute finding. No improvement after receiving Solu-Medrol and DuoNeb. Patient started on continuous neb and admitted. Admission accepted by Karolee Stamps, [facility] hospitalist MLP.  Discussed with attending Dr Eulis Foster who agrees with plan of care.   Carman Ching, PA-C 04/27/14 Parker, MD 04/27/14 [phone]

## 2014-04-27 NOTE — H&P (Signed)
[facility] History and Physical                                                                                    [name], is a 54 y.o. male  MRN: [id number]   DOB - 11-27-1960  Admit Date - 04/27/2014  Outpatient Primary MD for the patient is [name] Junior, MD  With History of -  Past Medical History  Diagnosis Date  . Asthma   . DVT (deep venous thrombosis)     "I had ~ 10 in each leg"  . Pulmonary embolism X 1  . CAD (coronary artery disease) 10/21/13    BMS to D1  . COPD (chronic obstructive pulmonary disease)   . Myocardial infarction 09/2013  . Pneumonia X 2  . Sinus headache   . Arthritis     "hands and toes" (01/26/2014)      Past Surgical History  Procedure Laterality Date  . Vena cava filter placement  " ~ 2012  . Coronary angioplasty with stent placement  10/21/13    BMS to D1  . Left heart catheterization with coronary angiogram N/A 10/21/2013    Procedure: LEFT HEART CATHETERIZATION WITH CORONARY ANGIOGRAM;  Surgeon: [name] Man, MD;  Location: [facility] CATH LAB;  Service: Cardiovascular;  Laterality: N/A;    in for   Chief Complaint  Patient presents with  . Shortness of Breath     HPI [name]  is a 54 y.o. male, with past medical history of DVT and PE he is status post IVC filter on Eliquis, also recent NSTEMI in July 2015 with revascularization of the proximal & mid D1 on Effient, as well as COPD with continued tobacco abuse. He presents today with 3 days of progressive shortness of breath, chills and diaphoresis. He also reports that for the past day and a half he has had centralized chest pain that sounds pleuritic in nature.  In the ER he is found to be severely wheezing, breathing rapidly and with elevated BP.   He mentions that he stopped taking his blood pressure medications in the fall due to feeling tired and dizzy.    Review of Systems   In addition to the HPI above,  No Headache, No changes with Vision or hearing, No  problems swallowing food or Liquids, No Abdominal pain, No Nausea or Vomiting, Bowel movements are regular, No Blood in stool or Urine, No dysuria, No new skin rashes or bruises, No new joints pains-aches,  No new weakness, tingling, numbness in any extremity, No recent weight gain or loss, No polyuria, polydypsia or polyphagia, A full 10 point Review of Systems was done, except as stated above, all other Review of Systems were negative.  Social History History  Substance Use Topics  . Smoking status: Current Every Day Smoker -- 0.25 packs/day for 38 years    Types: Cigarettes  . Smokeless tobacco: Never Used  . Alcohol Use: 3.6 oz/week    6 Cans of beer per week     Comment: 01/26/2014 "drink  2 40oz beers weekly"  He lives with his girlfriend and her family. His girlfriend smokes. He smokes 5-6 cigarettes a  day and has smoked for 35 years. He drinks socially approximately 3-4 drinks a week. 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   Family History Family History  Problem Relation Age of Onset  . Asthma Mother   . Allergies Mother   . Allergies Sister   . Allergies Brother   . Deep vein thrombosis Brother     two brothers with recurrent DVT  His mother had asthma and died of respiratory failure. His father passed with heart disease.   Prior to Admission medications   Medication Sig Start Date End Date Taking? Authorizing Provider  albuterol (PROVENTIL HFA;VENTOLIN HFA) 108 (90 BASE) MCG/ACT inhaler Inhale 2 puffs into the lungs every 6 (six) hours as needed for wheezing or shortness of breath. 03/28/13  Yes [name] Eva, MD  apixaban (ELIQUIS) 2.5 MG TABS tablet Take 1 tablet (2.5 mg total) by mouth 2 (two) times daily. 10/27/13  Yes Thurnell Lose, MD  Fluticasone-Salmeterol (ADVAIR DISKUS) 250-50 MCG/DOSE AEPB Inhale 1 puff into the lungs 2 (two) times daily. 06/20/13  Yes Nishant Dhungel, MD  tiotropium (SPIRIVA) 18 MCG inhalation capsule Place 18 mcg into inhaler and  inhale daily.   Yes Historical Provider, MD  atorvastatin (LIPITOR) 80 MG tablet Take 1 tablet (80 mg total) by mouth daily at 6 PM. Patient not taking: Reported on 04/27/2014 10/27/13   Thurnell Lose, MD  azithromycin (ZITHROMAX) 250 MG tablet Take 1 tablet (250 mg total) by mouth daily. Take first 2 tablets together, then 1 every day until finished. Patient not taking: Reported on 04/27/2014 03/25/14   [name] Gilmore Cartner, PA-C  azithromycin (ZITHROMAX) 500 MG tablet Take 1 tablet (500 mg total) by mouth daily. Patient not taking: Reported on 04/27/2014 01/28/14   Debbe Odea, MD  isosorbide mononitrate (IMDUR) 30 MG 24 hr tablet Take 1 tablet (30 mg total) by mouth daily. Patient not taking: Reported on 04/27/2014 10/27/13   Thurnell Lose, MD  metoprolol tartrate (LOPRESSOR) 12.5 mg TABS tablet Take 0.5 tablets (12.5 mg total) by mouth 2 (two) times daily. Patient not taking: Reported on 04/27/2014 10/27/13   Thurnell Lose, MD  nitroGLYCERIN (NITROSTAT) 0.4 MG SL tablet Place 1 tablet (0.4 mg total) under the tongue every 5 (five) minutes as needed for chest pain (CP or SOB). 10/27/13   Thurnell Lose, MD  prasugrel (EFFIENT) 10 MG TABS tablet Take 1 tablet (10 mg total) by mouth daily. Patient not taking: Reported on 04/27/2014 10/27/13   Thurnell Lose, MD  predniSONE (DELTASONE) 20 MG tablet Take 2 tablets (40 mg total) by mouth daily with breakfast. Patient not taking: Reported on 04/27/2014 01/29/14   Debbe Odea, MD  predniSONE (DELTASONE) 20 MG tablet 3 tabs po day one, then 2 tabs daily x 4 days Patient not taking: Reported on 04/27/2014 03/25/14   [name] Gilmore Cartner, PA-C    No Known Allergies  Physical Exam  Vitals  Blood pressure 125/85, pulse 67, temperature 98.2 F (36.8 C), temperature source Oral, resp. rate 20, height 6\' 2"  (1.88 m), weight 81.647 kg (180 lb), SpO2 100 %.   General:  Wd, thin, AA male, very pleasant, with increased work of breathing    Psych:  Normal affect and  insight, Not Suicidal or Homicidal, Awake Alert, Oriented X 3.  Neuro:   No F.N deficits, ALL C.Nerves Intact, Strength 5/5 all 4 extremities, Sensation intact all 4 extremities.  ENT:  Ears and Eyes appear Normal, Conjunctivae clear, PER.  Moist oral mucosa without erythema or exudates.  Neck:  Supple, No lymphadenopathy appreciated  Respiratory:  Symmetrical chest wall movement, Good air movement bilaterally, CTAB.  Cardiac:  RRR, No Murmurs, no LE edema noted, no JVD.    Abdomen:  Positive bowel sounds, Soft, Non tender, Non distended,  No masses appreciated  Skin:  No Cyanosis, Normal Skin Turgor.  Macular rash of dark spots (acne?) on his back.  Extremities:  Able to move all 4. 5/5 strength in each,  no effusions.  Data Review  CBC  Recent Labs Lab 04/27/14 0735  WBC 3.7*  HGB 12.4*  HCT 36.3*  PLT 125*  MCV 90.5  MCH 30.9  MCHC 34.2  RDW 12.4    Chemistries   Recent Labs Lab 04/27/14 0735  NA 140  K 3.9  CL 111  CO2 23  GLUCOSE 105*  BUN 15  CREATININE 0.82  CALCIUM 9.1     Urinalysis    Component Value Date/Time   COLORURINE YELLOW 03/28/2012 0118   APPEARANCEUR CLEAR 03/28/2012 0118   LABSPEC 1.035* 03/28/2012 0118   PHURINE 6.0 03/28/2012 0118   GLUCOSEU >1000* 03/28/2012 0118   HGBUR NEGATIVE 03/28/2012 0118   BILIRUBINUR NEGATIVE 03/28/2012 0118   KETONESUR NEGATIVE 03/28/2012 0118   PROTEINUR NEGATIVE 03/28/2012 0118   UROBILINOGEN 0.2 03/28/2012 0118   NITRITE NEGATIVE 03/28/2012 0118   LEUKOCYTESUR NEGATIVE 03/28/2012 0118    Imaging results:   Dg Chest 2 View  04/27/2014   CLINICAL DATA:  54 year old male with chest pain, shortness of breath and cough  EXAM: CHEST  2 VIEW  COMPARISON:  Prior chest x-ray and chest CT 03/25/2014  FINDINGS: Cardiac and mediastinal contours are within normal limits. No focal airspace consolidation. No evidence of pneumothorax, pleural effusion or other acute abnormality. Central bronchitic changes are  again noted diffusely. No significant interval progression compared to the recent prior imaging. No acute osseous abnormality.  IMPRESSION: Stable chest x-ray without evidence of acute cardiopulmonary process.   Electronically Signed   By: Jacqulynn Cadet M.D.   On: 04/27/2014 08:00    My personal review of EKG: NSR, No ST changes noted.   Assessment & Plan  Principal Problem:   COPD with respiratory distress, acute Active Problems:   History of noncompliance with medical treatment   Tobacco abuse   History of DVT (deep vein thrombosis)   CAD (coronary artery disease)   Chest pain   COPD with acute exacerbation   HTN (hypertension)  COPD exacerbation with acute respiratory distress resp rate 32, being given continuous neb in ED.  Patient still smokes a few cigarettes daily. Place on levo, solumedrol, duonebs, protonix, SSI (due to steroids) Patient was prescribed Advair and Spiriva. But only uses an albuterol inhaler at home. Uncertain if this noncompliance is due to expense or not. Patient needs pulmonary follow up scheduled prior to discharge due to recurrent hospital visits related to COPD exacerbation.  Chest Pain with history of CAD with NSTEMI in 09/2013 His last cardiac echo in November 2015 showed grade 1 diastolic dysfunction. Patient was discharged on Effient but has stopped taking it - due to expense (?) Admit to tele.  Point-of-care troponin was 0 in the ER.  Hopefully chest pain is pleuritic.  EKG shows normal sinus. Cycle enzymes, morphine, oxygen, sublingual nitroglycerin when necessary. Resume Effient. Have counseled the patient about taking it.  Hypertension 161/138 in the ER. Patient reports he stopped taking his blood pressure medications in the fall due  to dizziness and fatigue. He was prescribed IMDUR and metoprolol.  Will start metoprolol 12.5 mg twice a day, and add hydralazine when necessary.  History of PE, DVT, status post IVC filter Continue  Eliquis  DVT Prophylaxis: Eliquis AM Labs Ordered, also please review Full Orders  Family Communication:   None at bedside. Patient is alert and oriented  Code Status:  Full  Condition:  Guarded  Time spent in minutes : 60   York, Marianne L PA-C on 04/27/2014 at 10:15 AM  Between 7am to 7pm - Pager - [phone]  After 7pm go to [email or web address] - password [email or web address]  And look for the night coverage person covering me after hours  [facility]  Attending Patient was seen, examined,treatment plan was discussed with the  Advance Practice Provider.  I have directly reviewed the clinical findings, lab, imaging studies and management of this patient in detail. I have made the necessary changes to the above noted documentation, and agree with the documentation, as recorded by the Advance Practice Provider.   54 year old male long-standing history of noncompliance to medications, history of COPD, CAD, venous thromboembolism-noncompliant to Effient presents with acute exacerbation of COPD. He also has chest pain, which is atypical and likely secondary to musculoskeletal etiology from persistent coughing. Admit to telemetry, cycle enzymes, agree with steroids, bronchodilators and empiric Levaquin. Will continue to follow closely.   Nena Alexander MD [facility].

## 2014-04-28 DIAGNOSIS — Z9119 Patient's noncompliance with other medical treatment and regimen: Secondary | ICD-10-CM

## 2014-04-28 DIAGNOSIS — Z720 Tobacco use: Secondary | ICD-10-CM

## 2014-04-28 DIAGNOSIS — Z86718 Personal history of other venous thrombosis and embolism: Secondary | ICD-10-CM

## 2014-04-28 DIAGNOSIS — J449 Chronic obstructive pulmonary disease, unspecified: Secondary | ICD-10-CM

## 2014-04-28 LAB — CBC
HEMATOCRIT: 35 % — AB (ref 39.0–52.0)
Hemoglobin: 11.8 g/dL — ABNORMAL LOW (ref 13.0–17.0)
MCH: 31.3 pg (ref 26.0–34.0)
MCHC: 33.7 g/dL (ref 30.0–36.0)
MCV: 92.8 fL (ref 78.0–100.0)
Platelets: 131 10*3/uL — ABNORMAL LOW (ref 150–400)
RBC: 3.77 MIL/uL — ABNORMAL LOW (ref 4.22–5.81)
RDW: 12.5 % (ref 11.5–15.5)
WBC: 2.3 10*3/uL — ABNORMAL LOW (ref 4.0–10.5)

## 2014-04-28 LAB — GLUCOSE, CAPILLARY
GLUCOSE-CAPILLARY: 123 mg/dL — AB (ref 70–99)
GLUCOSE-CAPILLARY: 163 mg/dL — AB (ref 70–99)
GLUCOSE-CAPILLARY: 171 mg/dL — AB (ref 70–99)
Glucose-Capillary: 144 mg/dL — ABNORMAL HIGH (ref 70–99)
Glucose-Capillary: 175 mg/dL — ABNORMAL HIGH (ref 70–99)

## 2014-04-28 LAB — HEMOGLOBIN A1C
HEMOGLOBIN A1C: 5.6 % (ref 4.8–5.6)
Mean Plasma Glucose: 114 mg/dL

## 2014-04-28 LAB — TROPONIN I: Troponin I: <0.03 ng/mL (ref <0.031)

## 2014-04-28 MED ORDER — GUAIFENESIN 100 MG/5ML PO SYRP
200.0000 mg | ORAL_SOLUTION | ORAL | Status: DC | PRN
  Administered 2014-04-28 – 2014-04-29 (×3): 200 mg via ORAL
  Filled 2014-04-28 (×6): qty 10

## 2014-04-28 MED ORDER — IPRATROPIUM-ALBUTEROL 0.5-2.5 (3) MG/3ML IN SOLN
3.0000 mL | Freq: Three times a day (TID) | RESPIRATORY_TRACT | Status: DC
  Administered 2014-04-28 – 2014-04-29 (×5): 3 mL via RESPIRATORY_TRACT
  Filled 2014-04-28 (×5): qty 3

## 2014-04-28 NOTE — Progress Notes (Signed)
PROGRESS NOTE  [name] FIE:[id number] DOB: 01/29/1961 DOA: 04/27/2014 PCP: [name], MD  Assessment/Plan: COPD exacerbation with acute respiratory distress Place on levo, solumedrol, duonebs, protonix, SSI (due to steroids) Patient was prescribed Advair and Spiriva. But only uses an albuterol inhaler at home. Uncertain if this noncompliance is due to expense or not.- care management consult Patient needs pulmonary follow up as outpatient  Chest Pain with history of CAD with NSTEMI in 09/2013 His last cardiac echo in November 2015 showed grade 1 diastolic dysfunction. Patient was discharged on Effient but has stopped taking it - due to expense (?) chest pain is pleuritic. EKG shows normal sinus. Resume Effient. Have counseled the patient about taking it.  Hypertension  Patient reports he stopped taking his blood pressure medications in the fall due to dizziness and fatigue. He was prescribed IMDUR and metoprolol. Will start metoprolol 12.5 mg twice a day, and add hydralazine when necessary.  Tobacco abuse -plans to quit on his birthday  History of PE, DVT, status post IVC filter Continue Eliquis  Pancytopenia -recheck CBC in AM  Care manager to speak with patient, ? Of whether patient can afford his meds  Code Status: full Family Communication: patient Disposition Plan:    Consultants:    Procedures:      HPI/Subjective: Pain with cough Chest pain reproducible with deep breath and palpation  Objective: Filed Vitals:   04/28/14 0515  BP: 135/90  Pulse: 84  Temp: 97.4 F (36.3 C)  Resp: 18    Intake/Output Summary (Last 24 hours) at 04/28/14 1013 Last data filed at 04/28/14 0517  Gross per 24 hour  Intake    300 ml  Output    525 ml  Net   -225 ml   Filed Weights   04/27/14 0711 04/27/14 1052 04/28/14 0515  Weight: 81.647 kg (180 lb) 77.429 kg (170 lb 11.2 oz) 78.654 kg (173 lb 6.4 oz)    Exam:   General:  A+Ox3,  NAD  Cardiovascular: rrr  Respiratory: coarse breath sounds  Abdomen: +BS, soft  Musculoskeletal: no edema   Data Reviewed: Basic Metabolic Panel:  Recent Labs Lab 04/27/14 0735  NA 140  K 3.9  CL 111  CO2 23  GLUCOSE 105*  BUN 15  CREATININE 0.82  CALCIUM 9.1   Liver Function Tests: No results for input(s): AST, ALT, ALKPHOS, BILITOT, PROT, ALBUMIN in the last 168 hours. No results for input(s): LIPASE, AMYLASE in the last 168 hours. No results for input(s): AMMONIA in the last 168 hours. CBC:  Recent Labs Lab 04/27/14 0735 04/28/14 0101  WBC 3.7* 2.3*  HGB 12.4* 11.8*  HCT 36.3* 35.0*  MCV 90.5 92.8  PLT 125* 131*   Cardiac Enzymes:  Recent Labs Lab 04/27/14 1235 04/27/14 1904 04/28/14 0101  TROPONINI <0.03 <0.03 <0.03   BNP (last 3 results)  Recent Labs  03/25/14 1837  BNP 30.4    ProBNP (last 3 results)  Recent Labs  01/26/14 0958  PROBNP 1619.0*    CBG:  Recent Labs Lab 04/27/14 1107 04/27/14 1635 04/27/14 2051 04/28/14 0642 04/28/14 0730  GLUCAP 198* 177* 166* 123* 163*    No results found for this or any previous visit (from the past 240 hour(s)).   Studies: Dg Chest 2 View  04/27/2014   CLINICAL DATA:  54 year old male with chest pain, shortness of breath and cough  EXAM: CHEST  2 VIEW  COMPARISON:  Prior chest x-ray and chest CT 03/25/2014  FINDINGS: Cardiac and  mediastinal contours are within normal limits. No focal airspace consolidation. No evidence of pneumothorax, pleural effusion or other acute abnormality. Central bronchitic changes are again noted diffusely. No significant interval progression compared to the recent prior imaging. No acute osseous abnormality.  IMPRESSION: Stable chest x-ray without evidence of acute cardiopulmonary process.   Electronically Signed   By: Jacqulynn Cadet M.D.   On: 04/27/2014 08:00    Scheduled Meds: . apixaban  2.5 mg Oral BID  . atorvastatin  80 mg Oral q1800  . docusate sodium   100 mg Oral Daily  . insulin aspart  0-9 Units Subcutaneous TID WC  . ipratropium-albuterol  3 mL Nebulization TID  . levofloxacin (LEVAQUIN) IV  500 mg Intravenous Q24H  . methylPREDNISolone (SOLU-MEDROL) injection  60 mg Intravenous 3 times per day  . metoprolol tartrate  12.5 mg Oral BID  . pantoprazole  40 mg Oral Daily  . prasugrel  10 mg Oral Daily  . sodium chloride  3 mL Intravenous Q12H   Continuous Infusions:  Antibiotics Given (last 72 hours)    Date/Time Action Medication Dose Rate   04/27/14 1351 Given   levofloxacin (LEVAQUIN) IVPB 500 mg 500 mg 100 mL/hr      Principal Problem:   COPD with respiratory distress, acute Active Problems:   History of noncompliance with medical treatment   Tobacco abuse   History of DVT (deep vein thrombosis)   CAD (coronary artery disease)   Chest pain   COPD with acute exacerbation   HTN (hypertension)    Time spent: 25 min    [name]  [facility] Pager [phone]. If 7PM-7AM, please contact night-coverage at [email or web address], password [facility] 04/28/2014, 10:13 AM  LOS: 1 day

## 2014-04-28 NOTE — Care Management Note (Addendum)
    Page 1 of 1   05/01/2014     11:39:17 AM CARE MANAGEMENT NOTE 05/01/2014  Patient:  [name],[name]   Account Number:  [id number]  Date Initiated:  04/28/2014  Documentation initiated by:  GRAVES-BIGELOW,Marimar Suber  Subjective/Objective Assessment:   Pt admitted for 3 days of progressive shortness of breath, chills and diaphoresis.     Action/Plan:   CM received referral for Medication assistance. Per pt he does not have an issue with cost. He stated the effient was making him dizzy and he stopped taking med. He will need Rx for Effient and has plenty of Eliquis at home.   Anticipated DC Date:  04/29/2014   Anticipated DC Plan:  [place]  CM consult      Choice offered to / List presented to:             Status of service:  Completed, signed off Medicare Important Message given?  YES (If response is "NO", the following Medicare IM given date fields will be blank) Date Medicare IM given:  05/01/2014 Medicare IM given by:  GRAVES-BIGELOW,Rilea Arutyunyan Date Additional Medicare IM given:   Additional Medicare IM given by:    Discharge Disposition:  HOME/SELF CARE  Per UR Regulation:  Reviewed for med. necessity/level of care/duration of stay  If discussed at [place] of Stay Meetings, dates discussed:    Comments:  04-28-14 [address] Jacqlyn Krauss, RN,BSN [phone] No further needs from CM at this time.

## 2014-04-28 NOTE — Progress Notes (Signed)
UR Completed Kyndal Heringer Graves-Bigelow, RN,BSN [phone]  

## 2014-04-29 LAB — BASIC METABOLIC PANEL
Anion gap: 5 (ref 5–15)
BUN: 17 mg/dL (ref 6–23)
CO2: 24 mmol/L (ref 19–32)
CREATININE: 0.81 mg/dL (ref 0.50–1.35)
Calcium: 8.8 mg/dL (ref 8.4–10.5)
Chloride: 108 mmol/L (ref 96–112)
GFR calc non Af Amer: >90 mL/min (ref >90)
GLUCOSE: 189 mg/dL — AB (ref 70–99)
POTASSIUM: 4.5 mmol/L (ref 3.5–5.1)
Sodium: 137 mmol/L (ref 135–145)

## 2014-04-29 LAB — CBC
HCT: 34.1 % — ABNORMAL LOW (ref 39.0–52.0)
Hemoglobin: 11.6 g/dL — ABNORMAL LOW (ref 13.0–17.0)
MCH: 31.2 pg (ref 26.0–34.0)
MCHC: 34 g/dL (ref 30.0–36.0)
MCV: 91.7 fL (ref 78.0–100.0)
Platelets: 123 10*3/uL — ABNORMAL LOW (ref 150–400)
RBC: 3.72 MIL/uL — ABNORMAL LOW (ref 4.22–5.81)
RDW: 12.6 % (ref 11.5–15.5)
WBC: 3.2 10*3/uL — AB (ref 4.0–10.5)

## 2014-04-29 LAB — GLUCOSE, CAPILLARY
GLUCOSE-CAPILLARY: 152 mg/dL — AB (ref 70–99)
GLUCOSE-CAPILLARY: 167 mg/dL — AB (ref 70–99)
Glucose-Capillary: 173 mg/dL — ABNORMAL HIGH (ref 70–99)
Glucose-Capillary: 196 mg/dL — ABNORMAL HIGH (ref 70–99)

## 2014-04-29 LAB — HIV ANTIBODY (ROUTINE TESTING W REFLEX): HIV SCREEN 4TH GENERATION: NONREACTIVE

## 2014-04-29 MED ORDER — LEVOFLOXACIN 500 MG PO TABS
500.0000 mg | ORAL_TABLET | Freq: Every day | ORAL | Status: DC
  Administered 2014-04-30: 500 mg via ORAL
  Filled 2014-04-29: qty 1

## 2014-04-29 MED ORDER — IPRATROPIUM-ALBUTEROL 0.5-2.5 (3) MG/3ML IN SOLN
3.0000 mL | Freq: Four times a day (QID) | RESPIRATORY_TRACT | Status: DC
  Administered 2014-04-29 – 2014-04-30 (×3): 3 mL via RESPIRATORY_TRACT
  Filled 2014-04-29 (×3): qty 3

## 2014-04-29 MED ORDER — GUAIFENESIN ER 600 MG PO TB12
600.0000 mg | ORAL_TABLET | Freq: Two times a day (BID) | ORAL | Status: DC
  Administered 2014-04-29 – 2014-05-01 (×4): 600 mg via ORAL
  Filled 2014-04-29 (×4): qty 1

## 2014-04-29 NOTE — Progress Notes (Signed)
PROGRESS NOTE  [name] GYI:[id number] DOB: 1960/07/08 DOA: 04/27/2014 PCP: [name], MD  Assessment/Plan: COPD exacerbation with acute respiratory distress Place on levo, solumedrol, duonebs, protonix, SSI (due to steroids) Patient was prescribed Advair and Spiriva. But only uses an albuterol inhaler at home. Uncertain if this noncompliance is due to expense or not.- care management consult Patient needs pulmonary follow up as outpatient --2/3 persistent significant bilateral wheezing, increase duoneb frequency, add mucinex  Chest Pain with history of CAD with NSTEMI in 09/2013 His last cardiac echo in November 2015 showed grade 1 diastolic dysfunction. Patient was discharged on Effient but has stopped taking it - due to expense (?) chest pain is pleuritic. EKG shows normal sinus. Troponin negative x3 Resume Effient. Have counseled the patient about taking it.  Hypertension  Patient reports he stopped taking his blood pressure medications in the fall due to dizziness and fatigue. He was prescribed IMDUR and metoprolol. Will start metoprolol 12.5 mg twice a day, and add hydralazine when necessary.  Tobacco abuse -plans to quit on his birthday  History of PE, DVT, status post IVC filter Continue Eliquis  Pancytopenia -recheck CBC in AM  Care manager to speak with patient, ? Of whether patient can afford his meds  Code Status: full Family Communication: patient Disposition Plan:    Consultants:  none  Procedures:  none    HPI/Subjective: Pain with cough/wheezing Chest pain reproducible with deep breath and palpation  Objective: Filed Vitals:   04/29/14 1358  BP: 132/74  Pulse: 71  Temp: 98.1 F (36.7 C)  Resp: 18    Intake/Output Summary (Last 24 hours) at 04/29/14 1604 Last data filed at 04/29/14 0300  Gross per 24 hour  Intake    200 ml  Output    575 ml  Net   -375 ml   Filed Weights   04/27/14 1052 04/28/14 0515 04/29/14 0453    Weight: 77.429 kg (170 lb 11.2 oz) 78.654 kg (173 lb 6.4 oz) 81 kg (178 lb 9.2 oz)    Exam:   General:  A+Ox3, NAD  Cardiovascular: rrr  Respiratory: coarse breath sounds, diffuse wheezing bilaterally  Abdomen: +BS, soft  Musculoskeletal: no edema   Data Reviewed: Basic Metabolic Panel:  Recent Labs Lab 04/27/14 0735 04/29/14 0534  NA 140 137  K 3.9 4.5  CL 111 108  CO2 23 24  GLUCOSE 105* 189*  BUN 15 17  CREATININE 0.82 0.81  CALCIUM 9.1 8.8   Liver Function Tests: No results for input(s): AST, ALT, ALKPHOS, BILITOT, PROT, ALBUMIN in the last 168 hours. No results for input(s): LIPASE, AMYLASE in the last 168 hours. No results for input(s): AMMONIA in the last 168 hours. CBC:  Recent Labs Lab 04/27/14 0735 04/28/14 0101 04/29/14 0534  WBC 3.7* 2.3* 3.2*  HGB 12.4* 11.8* 11.6*  HCT 36.3* 35.0* 34.1*  MCV 90.5 92.8 91.7  PLT 125* 131* 123*   Cardiac Enzymes:  Recent Labs Lab 04/27/14 1235 04/27/14 1904 04/28/14 0101  TROPONINI <0.03 <0.03 <0.03   BNP (last 3 results)  Recent Labs  03/25/14 1837  BNP 30.4    ProBNP (last 3 results)  Recent Labs  01/26/14 0958  PROBNP 1619.0*    CBG:  Recent Labs Lab 04/28/14 1135 04/28/14 1636 04/28/14 2052 04/29/14 0731 04/29/14 1154  GLUCAP 171* 144* 175* 152* 167*    No results found for this or any previous visit (from the past 240 hour(s)).   Studies: No results found.  Scheduled  Meds: . apixaban  2.5 mg Oral BID  . atorvastatin  80 mg Oral q1800  . docusate sodium  100 mg Oral Daily  . guaiFENesin  600 mg Oral BID  . insulin aspart  0-9 Units Subcutaneous TID WC  . ipratropium-albuterol  3 mL Nebulization Q6H  . levofloxacin (LEVAQUIN) IV  500 mg Intravenous Q24H  . methylPREDNISolone (SOLU-MEDROL) injection  60 mg Intravenous 3 times per day  . metoprolol tartrate  12.5 mg Oral BID  . pantoprazole  40 mg Oral Daily  . prasugrel  10 mg Oral Daily  . sodium chloride  3 mL  Intravenous Q12H   Continuous Infusions:  Antibiotics Given (last 72 hours)    Date/Time Action Medication Dose Rate   04/27/14 1351 Given   levofloxacin (LEVAQUIN) IVPB 500 mg 500 mg 100 mL/hr   04/28/14 1213 Given   levofloxacin (LEVAQUIN) IVPB 500 mg 500 mg 100 mL/hr   04/29/14 1304 Given   levofloxacin (LEVAQUIN) IVPB 500 mg 500 mg 100 mL/hr      Principal Problem:   COPD with respiratory distress, acute Active Problems:   History of noncompliance with medical treatment   Tobacco abuse   History of DVT (deep vein thrombosis)   CAD (coronary artery disease)   Chest pain   COPD with acute exacerbation   HTN (hypertension)     Loetta Connelley  [facility] Pager [phone]. If 7PM-7AM, please contact night-coverage at [email or web address], password [facility] 04/29/2014, 4:04 PM  LOS: 2 days

## 2014-04-30 LAB — CBC
HEMATOCRIT: 33.9 % — AB (ref 39.0–52.0)
Hemoglobin: 11.3 g/dL — ABNORMAL LOW (ref 13.0–17.0)
MCH: 30.5 pg (ref 26.0–34.0)
MCHC: 33.3 g/dL (ref 30.0–36.0)
MCV: 91.6 fL (ref 78.0–100.0)
PLATELETS: 125 10*3/uL — AB (ref 150–400)
RBC: 3.7 MIL/uL — ABNORMAL LOW (ref 4.22–5.81)
RDW: 12.4 % (ref 11.5–15.5)
WBC: 3.2 10*3/uL — AB (ref 4.0–10.5)

## 2014-04-30 LAB — BASIC METABOLIC PANEL
Anion gap: 4 — ABNORMAL LOW (ref 5–15)
BUN: 18 mg/dL (ref 6–23)
CHLORIDE: 109 mmol/L (ref 96–112)
CO2: 23 mmol/L (ref 19–32)
CREATININE: 0.79 mg/dL (ref 0.50–1.35)
Calcium: 8.7 mg/dL (ref 8.4–10.5)
GFR calc Af Amer: >90 mL/min (ref >90)
GFR calc non Af Amer: >90 mL/min (ref >90)
Glucose, Bld: 138 mg/dL — ABNORMAL HIGH (ref 70–99)
Potassium: 4.7 mmol/L (ref 3.5–5.1)
SODIUM: 136 mmol/L (ref 135–145)

## 2014-04-30 LAB — GLUCOSE, CAPILLARY
GLUCOSE-CAPILLARY: 131 mg/dL — AB (ref 70–99)
GLUCOSE-CAPILLARY: 171 mg/dL — AB (ref 70–99)
Glucose-Capillary: 149 mg/dL — ABNORMAL HIGH (ref 70–99)
Glucose-Capillary: 173 mg/dL — ABNORMAL HIGH (ref 70–99)

## 2014-04-30 LAB — MAGNESIUM: MAGNESIUM: 2.3 mg/dL (ref 1.5–2.5)

## 2014-04-30 LAB — MRSA PCR SCREENING: MRSA BY PCR: NEGATIVE

## 2014-04-30 MED ORDER — CEFTRIAXONE SODIUM IN DEXTROSE 20 MG/ML IV SOLN
1.0000 g | INTRAVENOUS | Status: DC
  Administered 2014-04-30: 1 g via INTRAVENOUS
  Filled 2014-04-30 (×3): qty 50

## 2014-04-30 MED ORDER — IPRATROPIUM-ALBUTEROL 0.5-2.5 (3) MG/3ML IN SOLN
3.0000 mL | RESPIRATORY_TRACT | Status: DC
Start: 2014-04-30 — End: 2014-05-01
  Administered 2014-04-30 – 2014-05-01 (×5): 3 mL via RESPIRATORY_TRACT
  Filled 2014-04-30 (×6): qty 3

## 2014-04-30 MED ORDER — DEXTROSE 5 % IV SOLN
500.0000 mg | INTRAVENOUS | Status: DC
  Administered 2014-04-30: 500 mg via INTRAVENOUS
  Filled 2014-04-30 (×2): qty 500

## 2014-04-30 NOTE — Progress Notes (Signed)
PROGRESS NOTE  [name] QAS:[id number] DOB: 09-Sep-1960 DOA: 04/27/2014 PCP: [name], MD  Assessment/Plan: COPD exacerbation with acute respiratory distress Place on levo, solumedrol, duonebs, protonix, SSI (due to steroids) Patient was prescribed Advair and Spiriva. But only uses an albuterol inhaler at home. Uncertain if this noncompliance is due to expense or not.- care management consult Patient needs pulmonary follow up as outpatient --2/3 persistent significant bilateral wheezing, increase duoneb frequency, add mucinex 2/4 persistent significant bilateral wheezing, increase duoneb to q4hr, change bac to iv rocephin/zitrho. Continue mucinex/iv steroids. Sputum culture/mrsa screening pending  Chest Pain with history of CAD with NSTEMI in 09/2013 His last cardiac echo in November 2015 showed grade 1 diastolic dysfunction. Patient was discharged on Effient but has stopped taking it - due to expense (?) chest pain is pleuritic. EKG shows normal sinus. Troponin negative x3 Resume Effient. Have counseled the patient about taking it.  Hypertension  Patient reports he stopped taking his blood pressure medications in the fall due to dizziness and fatigue. He was prescribed IMDUR and metoprolol. Will start metoprolol 12.5 mg twice a day, and add hydralazine when necessary.  Tobacco abuse -plans to quit on his birthday  History of PE, DVT, status post IVC filter Continue Eliquis  Pancytopenia -mild, at baseline, check b12/folate/tsh for now, consider outpatient hematology referral  Care manager to speak with patient, ? Of whether patient can afford his meds  Code Status: full Family Communication: patient Disposition Plan:  Remain inpatient, home when stable   Consultants:  none  Procedures:  none    HPI/Subjective: Pain with persistent cough/wheezing Chest pain reproducible with deep breath and palpation  Objective: Filed Vitals:   04/30/14 0600  BP:  136/90  Pulse: 64  Temp: 98.3 F (36.8 C)  Resp: 18    Intake/Output Summary (Last 24 hours) at 04/30/14 1359 Last data filed at 04/30/14 1200  Gross per 24 hour  Intake    720 ml  Output   1150 ml  Net   -430 ml   Filed Weights   04/27/14 1052 04/28/14 0515 04/29/14 0453  Weight: 77.429 kg (170 lb 11.2 oz) 78.654 kg (173 lb 6.4 oz) 81 kg (178 lb 9.2 oz)    Exam:   General:  A+Ox3, NAD  Cardiovascular: rrr  Respiratory: coarse breath sounds, diffuse wheezing bilaterally  Abdomen: +BS, soft  Musculoskeletal: no edema   Data Reviewed: Basic Metabolic Panel:  Recent Labs Lab 04/27/14 0735 04/29/14 0534 04/30/14 0603  NA 140 137 136  K 3.9 4.5 4.7  CL 111 108 109  CO2 23 24 23   GLUCOSE 105* 189* 138*  BUN 15 17 18   CREATININE 0.82 0.81 0.79  CALCIUM 9.1 8.8 8.7  MG  --   --  2.3   Liver Function Tests: No results for input(s): AST, ALT, ALKPHOS, BILITOT, PROT, ALBUMIN in the last 168 hours. No results for input(s): LIPASE, AMYLASE in the last 168 hours. No results for input(s): AMMONIA in the last 168 hours. CBC:  Recent Labs Lab 04/27/14 0735 04/28/14 0101 04/29/14 0534 04/30/14 0603  WBC 3.7* 2.3* 3.2* 3.2*  HGB 12.4* 11.8* 11.6* 11.3*  HCT 36.3* 35.0* 34.1* 33.9*  MCV 90.5 92.8 91.7 91.6  PLT 125* 131* 123* 125*   Cardiac Enzymes:  Recent Labs Lab 04/27/14 1235 04/27/14 1904 04/28/14 0101  TROPONINI <0.03 <0.03 <0.03   BNP (last 3 results)  Recent Labs  03/25/14 1837  BNP 30.4    ProBNP (last 3 results)  Recent Labs  01/26/14 0958  PROBNP 1619.0*    CBG:  Recent Labs Lab 04/29/14 1154 04/29/14 1642 04/29/14 2049 04/30/14 0729 04/30/14 1137  GLUCAP 167* 173* 196* 149* 131*    No results found for this or any previous visit (from the past 240 hour(s)).   Studies: No results found.  Scheduled Meds: . apixaban  2.5 mg Oral BID  . atorvastatin  80 mg Oral q1800  . azithromycin  500 mg Intravenous Q24H  .  cefTRIAXone (ROCEPHIN)  IV  1 g Intravenous Q24H  . docusate sodium  100 mg Oral Daily  . guaiFENesin  600 mg Oral BID  . insulin aspart  0-9 Units Subcutaneous TID WC  . ipratropium-albuterol  3 mL Nebulization Q4H  . methylPREDNISolone (SOLU-MEDROL) injection  60 mg Intravenous 3 times per day  . metoprolol tartrate  12.5 mg Oral BID  . pantoprazole  40 mg Oral Daily  . prasugrel  10 mg Oral Daily  . sodium chloride  3 mL Intravenous Q12H   Continuous Infusions:  Antibiotics Given (last 72 hours)    Date/Time Action Medication Dose Rate   04/28/14 1213 Given   levofloxacin (LEVAQUIN) IVPB 500 mg 500 mg 100 mL/hr   04/29/14 1304 Given   levofloxacin (LEVAQUIN) IVPB 500 mg 500 mg 100 mL/hr   04/30/14 1044 Given   levofloxacin (LEVAQUIN) tablet 500 mg 500 mg       Principal Problem:   COPD with respiratory distress, acute Active Problems:   History of noncompliance with medical treatment   Tobacco abuse   History of DVT (deep vein thrombosis)   CAD (coronary artery disease)   Chest pain   COPD with acute exacerbation   HTN (hypertension)     [name]  [facility] Pager [phone]. If 7PM-7AM, please contact night-coverage at [email or web address], password [facility] 04/30/2014, 1:59 PM  LOS: 3 days

## 2014-05-01 LAB — BASIC METABOLIC PANEL
Anion gap: 3 — ABNORMAL LOW (ref 5–15)
BUN: 21 mg/dL (ref 6–23)
CHLORIDE: 107 mmol/L (ref 96–112)
CO2: 27 mmol/L (ref 19–32)
Calcium: 8.8 mg/dL (ref 8.4–10.5)
Creatinine, Ser: 0.8 mg/dL (ref 0.50–1.35)
GFR calc Af Amer: >90 mL/min (ref >90)
GLUCOSE: 130 mg/dL — AB (ref 70–99)
Potassium: 4.4 mmol/L (ref 3.5–5.1)
Sodium: 137 mmol/L (ref 135–145)

## 2014-05-01 LAB — CBC
HEMATOCRIT: 33.7 % — AB (ref 39.0–52.0)
Hemoglobin: 11.6 g/dL — ABNORMAL LOW (ref 13.0–17.0)
MCH: 31.3 pg (ref 26.0–34.0)
MCHC: 34.4 g/dL (ref 30.0–36.0)
MCV: 90.8 fL (ref 78.0–100.0)
Platelets: 128 10*3/uL — ABNORMAL LOW (ref 150–400)
RBC: 3.71 MIL/uL — AB (ref 4.22–5.81)
RDW: 12.4 % (ref 11.5–15.5)
WBC: 2.9 10*3/uL — ABNORMAL LOW (ref 4.0–10.5)

## 2014-05-01 LAB — GLUCOSE, CAPILLARY: Glucose-Capillary: 121 mg/dL — ABNORMAL HIGH (ref 70–99)

## 2014-05-01 LAB — MAGNESIUM: Magnesium: 2.6 mg/dL — ABNORMAL HIGH (ref 1.5–2.5)

## 2014-05-01 LAB — VITAMIN B12: Vitamin B-12: 525 pg/mL (ref 211–911)

## 2014-05-01 LAB — TSH: TSH: 0.347 u[IU]/mL — ABNORMAL LOW (ref 0.350–4.500)

## 2014-05-01 MED ORDER — TIOTROPIUM BROMIDE MONOHYDRATE 18 MCG IN CAPS: 18.0000 ug | ORAL_CAPSULE | Freq: Every day | RESPIRATORY_TRACT | Status: DC

## 2014-05-01 MED ORDER — FLUTICASONE-SALMETEROL 250-50 MCG/DOSE IN AEPB: 1.0000 | INHALATION_SPRAY | Freq: Two times a day (BID) | RESPIRATORY_TRACT | Status: DC

## 2014-05-01 MED ORDER — METOPROLOL TARTRATE 25 MG PO TABS: 12.5000 mg | ORAL_TABLET | Freq: Two times a day (BID) | ORAL | Status: DC

## 2014-05-01 MED ORDER — DOXYCYCLINE HYCLATE 100 MG PO TABS: 100.0000 mg | ORAL_TABLET | Freq: Two times a day (BID) | ORAL | Status: DC

## 2014-05-01 MED ORDER — ALBUTEROL SULFATE HFA 108 (90 BASE) MCG/ACT IN AERS: 2.0000 | INHALATION_SPRAY | Freq: Four times a day (QID) | RESPIRATORY_TRACT | Status: DC | PRN

## 2014-05-01 MED ORDER — PREDNISONE 10 MG PO KIT: PACK | ORAL | Status: DC

## 2014-05-01 NOTE — Discharge Summary (Signed)
Discharge Summary  [name] UDJ:[id number] DOB: 04-16-1960  PCP: [name], MD  Admit date: 04/27/2014 Discharge date: 05/01/2014  Time spent: >89mns  Recommendations for Outpatient Follow-up:  1. F/u with pmd in 1wk, home bp monitoring and bring in bp record to pmd office for further titrate bp meds. Smoking cessation guidance. Monitor CBA, consider hematology referral for cytopenia. pmd to f/u on b12/folate result. 2. F/u with Cardiology in 2wks for history of cad/NSTEMI, s/p stent in 2015. 3. F/u with pulmonology in 3wks f or copd management.  Discharge Diagnoses:  Active Hospital Problems   Diagnosis Date Noted  . COPD with respiratory distress, acute 04/27/2014  . COPD with acute exacerbation 04/27/2014  . HTN (hypertension) 04/27/2014  . Chest pain 01/26/2014  . CAD (coronary artery disease) 10/21/2013  . History of DVT (deep vein thrombosis) 02/21/2013  . Tobacco abuse 08/09/2012  . History of noncompliance with medical treatment 03/24/2012    Resolved Hospital Problems   Diagnosis Date Noted Date Resolved  No resolved problems to display.    Discharge Condition: stable, much improved  Diet recommendation: cardiac diet  Filed Weights   04/28/14 0515 04/29/14 0453 05/01/14 0400  Weight: 78.654 kg (173 lb 6.4 oz) 81 kg (178 lb 9.2 oz) 83.507 kg (184 lb 1.6 oz)    History of present illness:  [name] a 54y.o. male, with past medical history of DVT and PE he is status post IVC filter on Eliquis, also recent NSTEMI in July 2015 with revascularization of the proximal & mid D1 on Effient, as well as COPD with continued tobacco abuse. He presents with 3 days of progressive shortness of breath, chills and diaphoresis. He also reports that for the past day and a half prior to this admission he has had centralized chest pain that sounds pleuritic in nature. In the ER he is found to be severely wheezing, breathing rapidly and with elevated BP. He  mentions that he stopped taking his blood pressure medications in the fall due to feeling tired and dizzy.  He reported he run out of albuterol/advir/spiriva. He continued to smoke daily.  Hospital Course:  Principal Problem:   COPD with respiratory distress, acute Active Problems:   History of noncompliance with medical treatment   Tobacco abuse   History of DVT (deep vein thrombosis)   CAD (coronary artery disease)   Chest pain   COPD with acute exacerbation   HTN (hypertension)  COPD exacerbation with acute respiratory distress Place on levo, solumedrol, duonebs, protonix, SSI (due to steroids) Patient was prescribed Advair and Spiriva. But only uses an albuterol inhaler at home. He has not been complinat with his meds. Patient needs pulmonary follow up as outpatient --2/3 persistent significant bilateral wheezing, increase duoneb frequency, add mucinex 2/4 persistent significant bilateral wheezing, increase duoneb to q4hr, change bac to iv rocephin/zitrho. Continue mucinex/iv steroids. mrsa screening negative 2/5 symptom much improved, wheezing resolved. Stable to discharge home. Patient has not been complaints with  meds and medical follow ups, education provided, new prescription provided.  Chest Pain with history of CAD with NSTEMI in 09/2013 His last cardiac echo in November 2015 showed grade 1 diastolic dysfunction. Last cath in 09/2013. Patient has not been back to cardiology for follow ups. Patient was discharged on Effient but has stopped taking it - due to expense (?) chest pain is pleuritic. EKG shows normal sinus. Troponin negative x3 Resume Effient. Have counseled the patient about taking it. Educated patient about the importance  of follow ups. Appointment scheduled in 3wks with Dr. Cardiology Ellyn Hack.  Hypertension Patient reports he stopped taking his blood pressure medications in the fall due to dizziness and fatigue. He was prescribed IMDUR and metoprolol. Will  start metoprolol 12.5 mg twice a day. bp stable on metoprolol only. He imdur stopped, he is instructed to monitor home bp, bring in bp record to pmd for further titration of bp meds,  Tobacco abuse -plans to quit on his [birth year]. Declined nictoin patch.  History of PE, DVT, status post IVC filter Continue Eliquis  Pancytopenia -mild, at baseline, check b12/folate result pending at time of discharge, consider outpatient hematology referral Procedures:  none  Consultations:  none  Discharge Exam: BP 137/88 mmHg  Pulse 62  Temp(Src) 98 F (36.7 C) (Oral)  Resp 18  Ht 6' 2"  (1.88 m)  Wt 83.507 kg (184 lb 1.6 oz)  BMI 23.63 kg/m2  SpO2 98%   General: A+Ox3, NAD  Cardiovascular: rrr  Respiratory: wheezing has resolved, good air entry bilaterally  Abdomen: +BS, soft  Musculoskeletal: no edema   Discharge Instructions You were cared for by a hospitalist during your hospital stay. If you have any questions about your discharge medications or the care you received while you were in the hospital after you are discharged, you can call the unit and asked to speak with the hospitalist on call if the hospitalist that took care of you is not available. Once you are discharged, your primary care physician will handle any further medical issues. Please note that NO REFILLS for any discharge medications will be authorized once you are discharged, as it is imperative that you return to your primary care physician (or establish a relationship with a primary care physician if you do not have one) for your aftercare needs so that they can reassess your need for medications and monitor your lab values.  Discharge Instructions    Diet - low sodium heart healthy    Complete by:  As directed      Increase activity slowly    Complete by:  As directed             Medication List    STOP taking these medications        azithromycin 250 MG tablet  Commonly known as:  ZITHROMAX      azithromycin 500 MG tablet  Commonly known as:  ZITHROMAX     isosorbide mononitrate 30 MG 24 hr tablet  Commonly known as:  IMDUR     predniSONE 20 MG tablet  Commonly known as:  DELTASONE  Replaced by:  PredniSONE 10 MG Kit      TAKE these medications        albuterol 108 (90 BASE) MCG/ACT inhaler  Commonly known as:  PROVENTIL HFA;VENTOLIN HFA  Inhale 2 puffs into the lungs every 6 (six) hours as needed for wheezing or shortness of breath.     apixaban 2.5 MG Tabs tablet  Commonly known as:  ELIQUIS  Take 1 tablet (2.5 mg total) by mouth 2 (two) times daily.     atorvastatin 80 MG tablet  Commonly known as:  LIPITOR  Take 1 tablet (80 mg total) by mouth daily at 6 PM.     doxycycline 100 MG tablet  Commonly known as:  VIBRA-TABS  Take 1 tablet (100 mg total) by mouth 2 (two) times daily.     Fluticasone-Salmeterol 250-50 MCG/DOSE Aepb  Commonly known as:  ADVAIR DISKUS  Inhale 1  puff into the lungs 2 (two) times daily.     metoprolol tartrate 25 MG tablet  Commonly known as:  LOPRESSOR  Take 0.5 tablets (12.5 mg total) by mouth 2 (two) times daily.     nitroGLYCERIN 0.4 MG SL tablet  Commonly known as:  NITROSTAT  Place 1 tablet (0.4 mg total) under the tongue every 5 (five) minutes as needed for chest pain (CP or SOB).     prasugrel 10 MG Tabs tablet  Commonly known as:  EFFIENT  Take 1 tablet (10 mg total) by mouth daily.     PredniSONE 10 MG Kit  62m po on day1, 551mpo day2, 4034mo day 3, 42m55m day4, 20mg27mday 5, 10mg 5mn day6, then stop.     tiotropium 18 MCG inhalation capsule  Commonly known as:  SPIRIVA  Place 1 capsule (18 mcg total) into inhaler and inhale daily.       No Known Allergies     Follow-up Information    Follow up with WILLIAHarvie Juniorn 1 week.   Specialty:  Specialist   Contact information:   [address] [place] [address] [phone] Follow up with HARDINLeonie Mann 2 weeks.   Specialty:   Cardiology   Contact information:   [address] [place] [phone]  The results of significant diagnostics from this hospitalization (including imaging, microbiology, ancillary and laboratory) are listed below for reference.    Significant Diagnostic Studies: Dg Chest 2 View  04/27/2014   CLINICAL DATA:  53 yea64old male with chest pain, shortness of breath and cough  EXAM: CHEST  2 VIEW  COMPARISON:  Prior chest x-ray and chest CT 03/25/2014  FINDINGS: Cardiac and mediastinal contours are within normal limits. No focal airspace consolidation. No evidence of pneumothorax, pleural effusion or other acute abnormality. Central bronchitic changes are again noted diffusely. No significant interval progression compared to the recent prior imaging. No acute osseous abnormality.  IMPRESSION: Stable chest x-ray without evidence of acute cardiopulmonary process.   Electronically Signed   By: Heath Jacqulynn Cadet  On: 04/27/2014 08:00    Microbiology: Recent Results (from the past 240 hour(s))  MRSA PCR Screening     Status: None   Collection Time: 04/30/14  2:59 PM  Result Value Ref Range Status   MRSA by PCR NEGATIVE NEGATIVE Final    Comment:        The GeneXpert MRSA Assay (FDA approved for NASAL specimens only), is one component of a comprehensive MRSA colonization surveillance program. It is not intended to diagnose MRSA infection nor to guide or monitor treatment for MRSA infections.      Labs: Basic Metabolic Panel:  Recent Labs Lab 04/27/14 0735 04/29/14 0534 04/30/14 0603 05/01/14 0431  NA 140 137 136 137  K 3.9 4.5 4.7 4.4  CL 111 108 109 107  CO2 23 24 23 27   GLUCOSE 105* 189* 138* 130*  BUN 15 17 18 21   CREATININE 0.82 0.81 0.79 0.80  CALCIUM 9.1 8.8 8.7 8.8  MG  --   --  2.3 2.6*   Liver Function Tests: No results for input(s): AST, ALT, ALKPHOS, BILITOT, PROT, ALBUMIN in the last 168 hours. No results for input(s):  LIPASE, AMYLASE in the last 168 hours. No results for input(s): AMMONIA in the last 168 hours. CBC:  Recent Labs Lab 04/27/14  4158 04/28/14 0101 04/29/14 0534 04/30/14 0603 05/01/14 0431  WBC 3.7* 2.3* 3.2* 3.2* 2.9*  HGB 12.4* 11.8* 11.6* 11.3* 11.6*  HCT 36.3* 35.0* 34.1* 33.9* 33.7*  MCV 90.5 92.8 91.7 91.6 90.8  PLT 125* 131* 123* 125* 128*   Cardiac Enzymes:  Recent Labs Lab 04/27/14 1235 04/27/14 1904 04/28/14 0101  TROPONINI <0.03 <0.03 <0.03   BNP: BNP (last 3 results)  Recent Labs  03/25/14 1837  BNP 30.4    ProBNP (last 3 results)  Recent Labs  01/26/14 0958  PROBNP 1619.0*    CBG:  Recent Labs Lab 04/30/14 0729 04/30/14 1137 04/30/14 1648 04/30/14 2048 05/01/14 0723  GLUCAP 149* 131* 171* 173* 121*       Signed:  Blayke Pinera  [facility] 05/01/2014, 10:35 AM

## 2014-05-04 LAB — FOLATE RBC
FOLATE, RBC: 814 ng/mL (ref >498)
Folate, Hemolysate: 273.4 ng/mL
Hematocrit: 33.6 % — ABNORMAL LOW (ref 37.5–51.0)

## 2014-06-13 ENCOUNTER — Inpatient Hospital Stay (HOSPITAL_COMMUNITY)
Admission: EM | Admit: 2014-06-13 | Discharge: 2014-06-18 | DRG: 189 | Disposition: A | Discharge status: Home / Self Care | Payer: Medicare Other | Attending: Internal Medicine | Admitting: Internal Medicine

## 2014-06-13 ENCOUNTER — Other Ambulatory Visit (HOSPITAL_COMMUNITY): Payer: Self-pay

## 2014-06-13 ENCOUNTER — Encounter (HOSPITAL_COMMUNITY): Payer: Self-pay | Admitting: *Deleted

## 2014-06-13 ENCOUNTER — Emergency Department (HOSPITAL_COMMUNITY): Payer: Medicare Other

## 2014-06-13 DIAGNOSIS — B182 Chronic viral hepatitis C: Secondary | ICD-10-CM | POA: Diagnosis present

## 2014-06-13 DIAGNOSIS — I252 Old myocardial infarction: Secondary | ICD-10-CM

## 2014-06-13 DIAGNOSIS — I25119 Atherosclerotic heart disease of native coronary artery with unspecified angina pectoris: Secondary | ICD-10-CM | POA: Diagnosis not present

## 2014-06-13 DIAGNOSIS — J45901 Unspecified asthma with (acute) exacerbation: Secondary | ICD-10-CM | POA: Diagnosis present

## 2014-06-13 DIAGNOSIS — R918 Other nonspecific abnormal finding of lung field: Secondary | ICD-10-CM | POA: Diagnosis not present

## 2014-06-13 DIAGNOSIS — Z7951 Long term (current) use of inhaled steroids: Secondary | ICD-10-CM

## 2014-06-13 DIAGNOSIS — F17210 Nicotine dependence, cigarettes, uncomplicated: Secondary | ICD-10-CM | POA: Diagnosis present

## 2014-06-13 DIAGNOSIS — I10 Essential (primary) hypertension: Secondary | ICD-10-CM | POA: Diagnosis present

## 2014-06-13 DIAGNOSIS — R7989 Other specified abnormal findings of blood chemistry: Secondary | ICD-10-CM

## 2014-06-13 DIAGNOSIS — Z91199 Patient's noncompliance with other medical treatment and regimen due to unspecified reason: Secondary | ICD-10-CM

## 2014-06-13 DIAGNOSIS — R0602 Shortness of breath: Secondary | ICD-10-CM | POA: Diagnosis not present

## 2014-06-13 DIAGNOSIS — R0789 Other chest pain: Secondary | ICD-10-CM | POA: Diagnosis not present

## 2014-06-13 DIAGNOSIS — R945 Abnormal results of liver function studies: Secondary | ICD-10-CM

## 2014-06-13 DIAGNOSIS — I2510 Atherosclerotic heart disease of native coronary artery without angina pectoris: Secondary | ICD-10-CM | POA: Diagnosis not present

## 2014-06-13 DIAGNOSIS — J441 Chronic obstructive pulmonary disease with (acute) exacerbation: Secondary | ICD-10-CM | POA: Diagnosis present

## 2014-06-13 DIAGNOSIS — M1990 Unspecified osteoarthritis, unspecified site: Secondary | ICD-10-CM | POA: Diagnosis present

## 2014-06-13 DIAGNOSIS — I82409 Acute embolism and thrombosis of unspecified deep veins of unspecified lower extremity: Secondary | ICD-10-CM | POA: Diagnosis present

## 2014-06-13 DIAGNOSIS — Z86718 Personal history of other venous thrombosis and embolism: Secondary | ICD-10-CM

## 2014-06-13 DIAGNOSIS — Z9119 Patient's noncompliance with other medical treatment and regimen: Secondary | ICD-10-CM | POA: Diagnosis present

## 2014-06-13 DIAGNOSIS — J440 Chronic obstructive pulmonary disease with acute lower respiratory infection: Secondary | ICD-10-CM | POA: Diagnosis present

## 2014-06-13 DIAGNOSIS — I701 Atherosclerosis of renal artery: Secondary | ICD-10-CM | POA: Diagnosis not present

## 2014-06-13 DIAGNOSIS — Z955 Presence of coronary angioplasty implant and graft: Secondary | ICD-10-CM | POA: Diagnosis not present

## 2014-06-13 DIAGNOSIS — J209 Acute bronchitis, unspecified: Secondary | ICD-10-CM | POA: Diagnosis present

## 2014-06-13 DIAGNOSIS — J9601 Acute respiratory failure with hypoxia: Secondary | ICD-10-CM | POA: Diagnosis not present

## 2014-06-13 DIAGNOSIS — K828 Other specified diseases of gallbladder: Secondary | ICD-10-CM | POA: Diagnosis not present

## 2014-06-13 DIAGNOSIS — B1920 Unspecified viral hepatitis C without hepatic coma: Secondary | ICD-10-CM | POA: Diagnosis present

## 2014-06-13 DIAGNOSIS — R109 Unspecified abdominal pain: Secondary | ICD-10-CM | POA: Diagnosis present

## 2014-06-13 DIAGNOSIS — I509 Heart failure, unspecified: Secondary | ICD-10-CM | POA: Diagnosis present

## 2014-06-13 DIAGNOSIS — Z86711 Personal history of pulmonary embolism: Secondary | ICD-10-CM

## 2014-06-13 DIAGNOSIS — R1031 Right lower quadrant pain: Secondary | ICD-10-CM | POA: Diagnosis not present

## 2014-06-13 LAB — CBC
HEMATOCRIT: 34.7 % — AB (ref 39.0–52.0)
HEMOGLOBIN: 11.9 g/dL — AB (ref 13.0–17.0)
MCH: 31.5 pg (ref 26.0–34.0)
MCHC: 34.3 g/dL (ref 30.0–36.0)
MCV: 91.8 fL (ref 78.0–100.0)
Platelets: 124 10*3/uL — ABNORMAL LOW (ref 150–400)
RBC: 3.78 MIL/uL — ABNORMAL LOW (ref 4.22–5.81)
RDW: 13.5 % (ref 11.5–15.5)
WBC: 4.2 10*3/uL (ref 4.0–10.5)

## 2014-06-13 LAB — I-STAT ARTERIAL BLOOD GAS, ED
ACID-BASE DEFICIT: 4 mmol/L — AB (ref 0.0–2.0)
BICARBONATE: 22.2 meq/L (ref 20.0–24.0)
O2 SAT: 87 %
PH ART: 7.313 — AB (ref 7.350–7.450)
PO2 ART: 58 mmHg — AB (ref 80.0–100.0)
TCO2: 24 mmol/L (ref 0–100)
pCO2 arterial: 43.9 mmHg (ref 35.0–45.0)

## 2014-06-13 LAB — INFLUENZA PANEL BY PCR (TYPE A & B)
H1N1FLUPCR: NOT DETECTED
Influenza A By PCR: NEGATIVE
Influenza B By PCR: NEGATIVE

## 2014-06-13 LAB — BASIC METABOLIC PANEL
ANION GAP: 10 (ref 5–15)
BUN: 8 mg/dL (ref 6–23)
CO2: 19 mmol/L (ref 19–32)
CREATININE: 0.89 mg/dL (ref 0.50–1.35)
Calcium: 9 mg/dL (ref 8.4–10.5)
Chloride: 112 mmol/L (ref 96–112)
GFR calc Af Amer: >90 mL/min (ref >90)
GFR calc non Af Amer: >90 mL/min (ref >90)
Glucose, Bld: 132 mg/dL — ABNORMAL HIGH (ref 70–99)
POTASSIUM: 3.5 mmol/L (ref 3.5–5.1)
Sodium: 141 mmol/L (ref 135–145)

## 2014-06-13 LAB — TROPONIN I
Troponin I: <0.03 ng/mL (ref <0.031)
Troponin I: <0.03 ng/mL (ref <0.031)

## 2014-06-13 LAB — I-STAT TROPONIN, ED: Troponin i, poc: 0 ng/mL (ref 0.00–0.08)

## 2014-06-13 MED ORDER — ALBUTEROL SULFATE (2.5 MG/3ML) 0.083% IN NEBU: 5.0000 mg | INHALATION_SOLUTION | RESPIRATORY_TRACT | Status: DC | PRN

## 2014-06-13 MED ORDER — SODIUM CHLORIDE 0.9 % IJ SOLN
3.0000 mL | Freq: Two times a day (BID) | INTRAMUSCULAR | Status: DC
  Administered 2014-06-13 – 2014-06-17 (×10): 3 mL via INTRAVENOUS
  Filled 2014-06-13: qty 3

## 2014-06-13 MED ORDER — APIXABAN 2.5 MG PO TABS: 2.5000 mg | ORAL_TABLET | Freq: Two times a day (BID) | ORAL | Status: DC

## 2014-06-13 MED ORDER — GUAIFENESIN ER 600 MG PO TB12
600.0000 mg | ORAL_TABLET | Freq: Two times a day (BID) | ORAL | Status: DC
  Administered 2014-06-13 – 2014-06-17 (×10): 600 mg via ORAL
  Filled 2014-06-13 (×12): qty 1

## 2014-06-13 MED ORDER — ENOXAPARIN SODIUM 40 MG/0.4ML ~~LOC~~ SOLN: 40.0000 mg | SUBCUTANEOUS | Status: DC

## 2014-06-13 MED ORDER — ONDANSETRON HCL 4 MG/2ML IJ SOLN: 4.0000 mg | Freq: Four times a day (QID) | INTRAMUSCULAR | Status: DC | PRN

## 2014-06-13 MED ORDER — TRAMADOL HCL 50 MG PO TABS
50.0000 mg | ORAL_TABLET | Freq: Four times a day (QID) | ORAL | Status: DC | PRN
  Administered 2014-06-13 – 2014-06-15 (×3): 50 mg via ORAL
  Filled 2014-06-13 (×3): qty 1

## 2014-06-13 MED ORDER — IPRATROPIUM-ALBUTEROL 0.5-2.5 (3) MG/3ML IN SOLN
3.0000 mL | Freq: Four times a day (QID) | RESPIRATORY_TRACT | Status: DC
  Administered 2014-06-13 – 2014-06-15 (×8): 3 mL via RESPIRATORY_TRACT
  Filled 2014-06-13 (×9): qty 3

## 2014-06-13 MED ORDER — ONDANSETRON HCL 4 MG/2ML IJ SOLN
4.0000 mg | Freq: Once | INTRAMUSCULAR | Status: AC
  Administered 2014-06-13: 4 mg via INTRAVENOUS
  Filled 2014-06-13: qty 2

## 2014-06-13 MED ORDER — MORPHINE SULFATE 4 MG/ML IJ SOLN
4.0000 mg | Freq: Once | INTRAMUSCULAR | Status: AC
  Administered 2014-06-13: 4 mg via INTRAVENOUS
  Filled 2014-06-13: qty 1

## 2014-06-13 MED ORDER — ACETAMINOPHEN 325 MG PO TABS
650.0000 mg | ORAL_TABLET | Freq: Four times a day (QID) | ORAL | Status: DC | PRN
  Administered 2014-06-13: 650 mg via ORAL
  Filled 2014-06-13: qty 2

## 2014-06-13 MED ORDER — PREDNISONE 20 MG PO TABS
60.0000 mg | ORAL_TABLET | Freq: Once | ORAL | Status: AC
  Administered 2014-06-13: 60 mg via ORAL
  Filled 2014-06-13: qty 3

## 2014-06-13 MED ORDER — BENZONATATE 100 MG PO CAPS
100.0000 mg | ORAL_CAPSULE | Freq: Three times a day (TID) | ORAL | Status: DC
  Administered 2014-06-13 – 2014-06-17 (×15): 100 mg via ORAL
  Filled 2014-06-13 (×15): qty 1

## 2014-06-13 MED ORDER — METHYLPREDNISOLONE SODIUM SUCC 125 MG IJ SOLR
60.0000 mg | Freq: Four times a day (QID) | INTRAMUSCULAR | Status: DC
  Administered 2014-06-13 (×2): 60 mg via INTRAVENOUS
  Filled 2014-06-13 (×2): qty 2

## 2014-06-13 MED ORDER — ONDANSETRON HCL 4 MG PO TABS
4.0000 mg | ORAL_TABLET | Freq: Four times a day (QID) | ORAL | Status: DC | PRN
  Administered 2014-06-13: 4 mg via ORAL
  Filled 2014-06-13: qty 1

## 2014-06-13 MED ORDER — LEVOFLOXACIN IN D5W 750 MG/150ML IV SOLN
750.0000 mg | INTRAVENOUS | Status: DC
  Administered 2014-06-13: 750 mg via INTRAVENOUS
  Filled 2014-06-13 (×2): qty 150

## 2014-06-13 MED ORDER — IBUPROFEN 800 MG PO TABS
800.0000 mg | ORAL_TABLET | Freq: Three times a day (TID) | ORAL | Status: DC
  Administered 2014-06-13 – 2014-06-15 (×6): 800 mg via ORAL
  Filled 2014-06-13 (×7): qty 1

## 2014-06-13 MED ORDER — ALBUTEROL SULFATE (2.5 MG/3ML) 0.083% IN NEBU
5.0000 mg | INHALATION_SOLUTION | Freq: Once | RESPIRATORY_TRACT | Status: AC
  Administered 2014-06-13: 5 mg via RESPIRATORY_TRACT
  Filled 2014-06-13: qty 6

## 2014-06-13 MED ORDER — VANCOMYCIN HCL 10 G IV SOLR
1250.0000 mg | Freq: Two times a day (BID) | INTRAVENOUS | Status: DC
  Administered 2014-06-13: 1250 mg via INTRAVENOUS
  Filled 2014-06-13 (×3): qty 1250

## 2014-06-13 MED ORDER — MOMETASONE FURO-FORMOTEROL FUM 100-5 MCG/ACT IN AERO
2.0000 | INHALATION_SPRAY | Freq: Two times a day (BID) | RESPIRATORY_TRACT | Status: DC
  Administered 2014-06-13: 2 via RESPIRATORY_TRACT
  Filled 2014-06-13: qty 8.8

## 2014-06-13 MED ORDER — IPRATROPIUM BROMIDE 0.02 % IN SOLN
0.5000 mg | Freq: Once | RESPIRATORY_TRACT | Status: AC
  Administered 2014-06-13: 0.5 mg via RESPIRATORY_TRACT
  Filled 2014-06-13: qty 2.5

## 2014-06-13 MED ORDER — IPRATROPIUM-ALBUTEROL 0.5-2.5 (3) MG/3ML IN SOLN
3.0000 mL | RESPIRATORY_TRACT | Status: DC
  Administered 2014-06-13 (×3): 3 mL via RESPIRATORY_TRACT
  Filled 2014-06-13 (×3): qty 3

## 2014-06-13 MED ORDER — ALBUTEROL SULFATE HFA 108 (90 BASE) MCG/ACT IN AERS: 2.0000 | INHALATION_SPRAY | Freq: Four times a day (QID) | RESPIRATORY_TRACT | Status: DC | PRN

## 2014-06-13 MED ORDER — ALBUTEROL (5 MG/ML) CONTINUOUS INHALATION SOLN
10.0000 mg/h | INHALATION_SOLUTION | RESPIRATORY_TRACT | Status: DC
  Administered 2014-06-13: 10 mg/h via RESPIRATORY_TRACT
  Filled 2014-06-13: qty 20

## 2014-06-13 MED ORDER — METHYLPREDNISOLONE SODIUM SUCC 125 MG IJ SOLR
60.0000 mg | Freq: Two times a day (BID) | INTRAMUSCULAR | Status: DC
  Administered 2014-06-13 – 2014-06-14 (×2): 60 mg via INTRAVENOUS
  Filled 2014-06-13 (×2): qty 2

## 2014-06-13 MED ORDER — APIXABAN 2.5 MG PO TABS
2.5000 mg | ORAL_TABLET | Freq: Two times a day (BID) | ORAL | Status: DC
Start: 2014-06-13 — End: 2014-06-18
  Administered 2014-06-13 – 2014-06-17 (×10): 2.5 mg via ORAL
  Filled 2014-06-13 (×12): qty 1

## 2014-06-13 MED ORDER — ACETAMINOPHEN 650 MG RE SUPP: 650.0000 mg | Freq: Four times a day (QID) | RECTAL | Status: DC | PRN

## 2014-06-13 NOTE — ED Notes (Signed)
Pt to ED via [facility] from home c/o asthma. Reports shortness of breath x 2 weeks worsening tonight. Has been using Proventil inhaler without relief. EMS gave 10 mg albuterol treatment and 125 solumedrol. CBG 88 Rhonchi noted throughout.

## 2014-06-13 NOTE — Progress Notes (Signed)
ANTICOAGULATION CONSULT NOTE - Follow Up Consult  Pharmacy Consult for Apixaban (continuation of home dose) Indication: hx of VTE  No Known Allergies   Vital Signs: Temp: 98.1 F (36.7 C) (03/19 0110) Temp Source: Oral (03/19 0110) BP: 137/82 mmHg (03/19 0345) Pulse Rate: 70 (03/19 0345)  Labs:  Recent Labs  06/13/14 0125  HGB 11.9*  HCT 34.7*  PLT 124*  CREATININE 0.89   Assessment: 54 y/o M on low dose apixaban prior to admission for history of VTE. Labs as above.   Goal of Therapy:  Monitor platelets by anticoagulation protocol: Yes   Plan:  -Continue apixaban 2.5 mg BID -Minimum q72h CBC while inpatient  -Monitor for bleeding  Narda Bonds 06/13/2014,4:39 AM

## 2014-06-13 NOTE — H&P (Signed)
[facility] History and Physical  Patient: [name]  MRN: [id number]  DOB: 1960-11-22  DOS: the patient was seen and examined on 06/13/2014 PCP: [name], MD  Chief Complaint: shortness of breath  HPI: Jarrette Dehner is a 54 y.o. male with Past medical history of coronary artery disease, COPD, DVT, noncompliance with medical treatment. The patient presents with complaints of cough and shortness of breath ongoing for last 2 weeks without any improvement. Patient denies any sick contacts. Patient was recently hospitalized for the same. Patient smokes 2-3 cigarettes on a daily basis. Today he also had some fluttering in her heart. Denies any dizziness lightheadedness. He denies any runny nose. Denies any rash anywhere. He mentions he is using inhaler albuterol, Eliquis, Effient only. And not taking any other medications.  The patient is coming from home And at his baseline independent for most of his ADL.  Review of Systems: as mentioned in the history of present illness.  A Comprehensive review of the other systems is negative.  Past Medical History  Diagnosis Date  . Asthma   . DVT (deep venous thrombosis)     "I had ~ 10 in each leg"  . Pulmonary embolism X 1  . CAD (coronary artery disease) 10/21/13    BMS to D1  . COPD (chronic obstructive pulmonary disease)   . Myocardial infarction 09/2013  . Pneumonia X 2  . Sinus headache   . Arthritis     "hands and toes" (01/26/2014)   Past Surgical History  Procedure Laterality Date  . Vena cava filter placement  " ~ 2012  . Coronary angioplasty with stent placement  10/21/13    BMS to D1  . Left heart catheterization with coronary angiogram N/A 10/21/2013    Procedure: LEFT HEART CATHETERIZATION WITH CORONARY ANGIOGRAM;  Surgeon: Leonie Man, MD;  Location: [facility] C[facility];  Service: Cardiovascular;  Laterality: N/A;   Social History:  reports that he has been smoking Cigarettes.  He has a 9.5 pack-year  smoking history. He has never used smokeless tobacco. He reports that he drinks about 3.6 oz of alcohol per week. He reports that he does not use illicit drugs.  No Known Allergies  Family History  Problem Relation Age of Onset  . Asthma Mother   . Allergies Mother   . Allergies Sister   . Allergies Brother   . Deep vein thrombosis Brother     two brothers with recurrent DVT    Prior to Admission medications   Medication Sig Start Date End Date Taking? Authorizing Provider  albuterol (PROVENTIL HFA;VENTOLIN HFA) 108 (90 BASE) MCG/ACT inhaler Inhale 2 puffs into the lungs every 6 (six) hours as needed for wheezing or shortness of breath. 05/01/14  Yes Florencia Reasons, MD  apixaban (ELIQUIS) 2.5 MG TABS tablet Take 1 tablet (2.5 mg total) by mouth 2 (two) times daily. 10/27/13  Yes Thurnell Lose, MD  Fluticasone-Salmeterol (ADVAIR DISKUS) 250-50 MCG/DOSE AEPB Inhale 1 puff into the lungs 2 (two) times daily. 05/01/14  Yes Florencia Reasons, MD  atorvastatin (LIPITOR) 80 MG tablet Take 1 tablet (80 mg total) by mouth daily at 6 PM. Patient not taking: Reported on 04/27/2014 10/27/13   Thurnell Lose, MD  doxycycline (VIBRA-TABS) 100 MG tablet Take 1 tablet (100 mg total) by mouth 2 (two) times daily. Patient not taking: Reported on 06/13/2014 05/01/14   Florencia Reasons, MD  metoprolol tartrate (LOPRESSOR) 25 MG tablet Take 0.5 tablets (12.5 mg total) by mouth 2 (  two) times daily. 05/01/14   Florencia Reasons, MD  nitroGLYCERIN (NITROSTAT) 0.4 MG SL tablet Place 1 tablet (0.4 mg total) under the tongue every 5 (five) minutes as needed for chest pain (CP or SOB). 10/27/13   Thurnell Lose, MD  prasugrel (EFFIENT) 10 MG TABS tablet Take 1 tablet (10 mg total) by mouth daily. Patient not taking: Reported on 04/27/2014 10/27/13   Thurnell Lose, MD  PredniSONE 10 MG KIT 42m po on day1, 527mpo day2, 4034mo day 3, 46m41m day4, 20mg37mday 5, 10mg 55mn day6, then stop. Patient not taking: Reported on 06/13/2014 05/01/14   Fang XFlorencia Reasons tiotropium (SPIRIVA) 18 MCG inhalation capsule Place 1 capsule (18 mcg total) into inhaler and inhale daily. 05/01/14   Fang XFlorencia Reasons  Physical Exam: Filed Vitals:   06/13/14 0530 06/13/14 0545 06/13/14 0554 06/13/14 0600  BP: 134/79 127/71 128/84   Pulse: 74 68 79   Temp:    97.4 F (36.3 C)  TempSrc:    Oral  Resp: _0 SpO2: 99% 100% 100%     General: Alert, Awake and Oriented to Time, Place and Person. Appear in mild distress Eyes: PERRL ENT: Oral Mucosa clear moist. Neck: no JVD Cardiovascular: S1 and S2 Present, on Murmur, Peripheral Pulses Present Respiratory: Bilateral Air entry equal and Decreased, extensive expiratory wheezes Abdomen: Bowel Sound presetn, Soft and non tender Skin: no Rash Extremities: no Pedal edema, no calf tenderness Neurologic: Grossly no focal neuro deficit.  Labs on Admission:  CBC:  Recent Labs Lab 06/13/14 0125  WBC 4.2  HGB 11.9*  HCT 34.7*  MCV 91.8  PLT 124*    CMP     Component Value Date/Time   NA 141 06/13/2014 0125   K 3.5 06/13/2014 0125   CL 112 06/13/2014 0125   CO2 19 06/13/2014 0125   GLUCOSE 132* 06/13/2014 0125   BUN 8 06/13/2014 0125   CREATININE 0.89 06/13/2014 0125   CALCIUM 9.0 06/13/2014 0125   PROT 6.8 10/21/2013 0537   ALBUMIN 3.2* 10/21/2013 0537   AST 41* 10/21/2013 0537   ALT 22 10/21/2013 0537   ALKPHOS 80 10/21/2013 0537   BILITOT 0.4 10/21/2013 0537   GFRNONAA >90 06/13/2014 0125   GFRAA >90 06/13/2014 0125    No results for input(s): LIPASE, AMYLASE in the last 168 hours.  No results for input(s): CKTOTAL, CKMB, CKMBINDEX, TROPONINI in the last 168 hours. BNP (last 3 results)  Recent Labs  03/25/14 1837  BNP 30.4    ProBNP (last 3 results)  Recent Labs  01/26/14 0958  PROBNP 1619.0*     Radiological Exams on Admission: Dg Chest Portable 1 View  06/13/2014   CLINICAL DATA:  Asthma, shortness of breath.  EXAM: PORTABLE CHEST - 1 VIEW  COMPARISON:  04/27/2014  FINDINGS:  Cardiomediastinal contours within normal range. Mild central peribronchial thickening. No confluent airspace opacity, pleural effusion, pneumothorax. Multilevel degenerative changes.  IMPRESSION: Mild central peribronchial thickening may reflect bronchitis (acute or chronic) versus reactive airway disease. No focal consolidation.   Electronically Signed   By: AndrewCarlos Levering  On: 06/13/2014 02:24    Assessment/Plan Principal Problem:   Acute respiratory failure with hypoxia Active Problems:   History of noncompliance with medical treatment   CAD (coronary artery disease)   Asthma exacerbation   DVT (deep venous thrombosis)   Essential hypertension   1. Acute respiratory failure with hypoxia  The patient is presenting with numbness of cough and shortness of breath. Chest x-ray shows bronchitis. Patient is hypoxic requiring 4 L of nasal cannula. Appears in significant respiratory distress. Patient will be admitted in stepdown unit. Treated with dual nebs, Solu-Medrol, vancomycin and levofloxacin, follow cultures and influenza PCR.  2.Coronary artery disease. Currently continuing Effient. Unsure whether Plavix was considered for the patient in the past as the patient is not compliant in the past with this medication due to cost.  3.DVT. Status post IVC filter. Continue Eliquis.  4.Hypertension. Patient is on Toprol which is not taking. Blood pressure stable. Continue close monitoring.  Advance goals of care discussion: full code  DVT Prophylaxis: on chronic therapeutic anticoagulation. Nutrition: cardiac diet  Disposition: Admitted to inpatient in step-downunit.  Author: Berle Mull, MD [facility] Pager: [phone] 06/13/2014, 6:31 AM    If 7PM-7AM, please contact night-coverage [email or web address] Password [email or web address]

## 2014-06-13 NOTE — Progress Notes (Signed)
ANTIBIOTIC CONSULT NOTE - INITIAL  Pharmacy Consult for Vancomycin/Levaquin  Indication: COPD exacerbation  No Known Allergies  Patient Measurements: ~83 kg  Vital Signs: Temp: 98.1 F (36.7 C) (03/19 0110) Temp Source: Oral (03/19 0110) BP: 137/82 mmHg (03/19 0345) Pulse Rate: 70 (03/19 0345)  Labs:  Recent Labs  06/13/14 0125  WBC 4.2  HGB 11.9*  PLT 124*  CREATININE 0.89    Medical History: Past Medical History  Diagnosis Date  . Asthma   . DVT (deep venous thrombosis)     "I had ~ 10 in each leg"  . Pulmonary embolism X 1  . CAD (coronary artery disease) 10/21/13    BMS to D1  . COPD (chronic obstructive pulmonary disease)   . Myocardial infarction 09/2013  . Pneumonia X 2  . Sinus headache   . Arthritis     "hands and toes" (01/26/2014)   Assessment: 54 y/o M with shortness of breath, CXR with bronchitis vs reactive airway disease, WBC WNL, renal function good, other labs as above.   Goal of Therapy:  Vancomycin trough level 15-20 mcg/ml  Plan:  -Vancomycin 1250 mg IV q12h -Levaquin 750 mg IV q24h -Trend WBC, temp, renal function  -Drug levels as indicated   Narda Bonds 06/13/2014,4:31 AM

## 2014-06-13 NOTE — ED Notes (Signed)
Lab at the bedside 

## 2014-06-13 NOTE — ED Notes (Signed)
Ordered a tray

## 2014-06-13 NOTE — ED Notes (Signed)
Pt stating his chest is bothering him from coughing.

## 2014-06-13 NOTE — ED Notes (Signed)
Repeat ekg given to Dr. Rogene Houston.

## 2014-06-13 NOTE — ED Notes (Signed)
Respiratory Therapy at the bedside.

## 2014-06-13 NOTE — ED Notes (Signed)
Pt c/o increased shortness of breath worsening tonight. Rhonchi noted throughout. Pt reports several episodes of  Sharp L sided chest pain yesterday. Took 2 nitro with some relief. Hx of MI. Reports productive cough with clear sputum. Pt also c/o back pain, worsening with coughing

## 2014-06-13 NOTE — ED Provider Notes (Signed)
CSN: [id number]     Arrival date & time 06/13/14  0102 History   First MD Initiated Contact with Patient 06/13/14 0108     Chief Complaint  Patient presents with  . Asthma     (Consider location/radiation/quality/duration/timing/severity/associated sxs/prior Treatment) HPI Comments: Patient is a 54 year old male with a past medical history of COPD, previous NSTEMI, CAD, CHF, and previous PE who presents with a 2 week history of SOB that acutely worsened prior to arrival. Symptoms started gradually and acutely worsened this evening. Patient reports generalized chest tightness. He has been using his albuterol inhaler 20X per day lately which provides little to no relief. Patient reports he still smokes cigarettes but "only a few per day." He reports associated hacking cough. He reports low back pain when he coughs. Patient also complains of intermittent sharp chest pain in the left chest for the past week. No aggravating/alleviating factors.    Past Medical History  Diagnosis Date  . Asthma   . DVT (deep venous thrombosis)     "I had ~ 10 in each leg"  . Pulmonary embolism X 1  . CAD (coronary artery disease) 10/21/13    BMS to D1  . COPD (chronic obstructive pulmonary disease)   . Myocardial infarction 09/2013  . Pneumonia X 2  . Sinus headache   . Arthritis     "hands and toes" (01/26/2014)   Past Surgical History  Procedure Laterality Date  . Vena cava filter placement  " ~ 2012  . Coronary angioplasty with stent placement  10/21/13    BMS to D1  . Left heart catheterization with coronary angiogram N/A 10/21/2013    Procedure: LEFT HEART CATHETERIZATION WITH CORONARY ANGIOGRAM;  Surgeon: Leonie Man, MD;  Location: [facility] CATH LAB;  Service: Cardiovascular;  Laterality: N/A;   Family History  Problem Relation Age of Onset  . Asthma Mother   . Allergies Mother   . Allergies Sister   . Allergies Brother   . Deep vein thrombosis Brother     two brothers with recurrent DVT    History  Substance Use Topics  . Smoking status: Current Every Day Smoker -- 0.25 packs/day for 38 years    Types: Cigarettes  . Smokeless tobacco: Never Used  . Alcohol Use: 3.6 oz/week    6 Cans of beer per week     Comment: 01/26/2014 "drink  2 40oz beers weekly"    Review of Systems  Constitutional: Negative for fever, chills and fatigue.  HENT: Negative for trouble swallowing.   Eyes: Negative for visual disturbance.  Respiratory: Positive for shortness of breath.   Cardiovascular: Positive for chest pain. Negative for palpitations.  Gastrointestinal: Negative for nausea, vomiting, abdominal pain and diarrhea.  Genitourinary: Negative for dysuria and difficulty urinating.  Musculoskeletal: Negative for arthralgias and neck pain.  Skin: Negative for color change.  Neurological: Negative for dizziness and weakness.  Psychiatric/Behavioral: Negative for dysphoric mood.      Allergies  Review of patient's allergies indicates no known allergies.  Home Medications   Prior to Admission medications   Medication Sig Start Date End Date Taking? Authorizing Provider  albuterol (PROVENTIL HFA;VENTOLIN HFA) 108 (90 BASE) MCG/ACT inhaler Inhale 2 puffs into the lungs every 6 (six) hours as needed for wheezing or shortness of breath. 05/01/14   Florencia Reasons, MD  apixaban (ELIQUIS) 2.5 MG TABS tablet Take 1 tablet (2.5 mg total) by mouth 2 (two) times daily. 10/27/13   Thurnell Lose, MD  atorvastatin (LIPITOR) 80 MG tablet Take 1 tablet (80 mg total) by mouth daily at 6 PM. Patient not taking: Reported on 04/27/2014 10/27/13   Thurnell Lose, MD  doxycycline (VIBRA-TABS) 100 MG tablet Take 1 tablet (100 mg total) by mouth 2 (two) times daily. 05/01/14   Florencia Reasons, MD  Fluticasone-Salmeterol (ADVAIR DISKUS) 250-50 MCG/DOSE AEPB Inhale 1 puff into the lungs 2 (two) times daily. 05/01/14   Florencia Reasons, MD  metoprolol tartrate (LOPRESSOR) 25 MG tablet Take 0.5 tablets (12.5 mg total) by mouth 2 (two) times  daily. 05/01/14   Florencia Reasons, MD  nitroGLYCERIN (NITROSTAT) 0.4 MG SL tablet Place 1 tablet (0.4 mg total) under the tongue every 5 (five) minutes as needed for chest pain (CP or SOB). 10/27/13   Thurnell Lose, MD  prasugrel (EFFIENT) 10 MG TABS tablet Take 1 tablet (10 mg total) by mouth daily. Patient not taking: Reported on 04/27/2014 10/27/13   Thurnell Lose, MD  PredniSONE 10 MG KIT $Remo'60mg'Invwc$  po on day1, $Remove'50mg'QBPGkuD$  po day2, $Remove'40mg'RXFqOuY$  po day 3, $Remo'30mg'cayaK$  po day4, $Remove'20mg'CJSLRUE$  po day 5, $Remo'10mg'gDBPM$  po on day6, then stop. 05/01/14   Florencia Reasons, MD  tiotropium (SPIRIVA) 18 MCG inhalation capsule Place 1 capsule (18 mcg total) into inhaler and inhale daily. 05/01/14   Florencia Reasons, MD   BP 123/81 mmHg  Pulse 83  Temp(Src) 98.1 F (36.7 C) (Oral)  Resp 28  SpO2 99% Physical Exam  Constitutional: He is oriented to person, place, and time. He appears well-developed and well-nourished. No distress.  HENT:  Head: Normocephalic and atraumatic.  Eyes: Conjunctivae and EOM are normal.  Neck: Normal range of motion.  Cardiovascular: Normal rate and regular rhythm.  Exam reveals no gallop and no friction rub.   No murmur heard. Pulmonary/Chest: Effort normal. He has wheezes. He has no rales. He exhibits no tenderness.  Inspiratory and expiratory wheezing in all lung fields.   Abdominal: Soft. He exhibits no distension. There is no tenderness.  Musculoskeletal: Normal range of motion.  No lower extremity edema or calf swelling.   Neurological: He is alert and oriented to person, place, and time. Coordination normal.  Speech is goal-oriented. Moves limbs without ataxia.   Skin: Skin is warm and dry.  Psychiatric: He has a normal mood and affect. His behavior is normal.  Nursing note and vitals reviewed.   ED Course  Procedures (including critical care time) Labs Review Labs Reviewed  CBC - Abnormal; Notable for the following:    RBC 3.78 (*)    Hemoglobin 11.9 (*)    HCT 34.7 (*)    Platelets 124 (*)    All other components within normal  limits  BASIC METABOLIC PANEL - Abnormal; Notable for the following:    Glucose, Bld 132 (*)    All other components within normal limits  BLOOD GAS, ARTERIAL  INFLUENZA PANEL BY PCR (TYPE A & B, H1N1)  I-STAT TROPOININ, ED    Imaging Review Dg Chest Portable 1 View  06/13/2014   CLINICAL DATA:  Asthma, shortness of breath.  EXAM: PORTABLE CHEST - 1 VIEW  COMPARISON:  04/27/2014  FINDINGS: Cardiomediastinal contours within normal range. Mild central peribronchial thickening. No confluent airspace opacity, pleural effusion, pneumothorax. Multilevel degenerative changes.  IMPRESSION: Mild central peribronchial thickening may reflect bronchitis (acute or chronic) versus reactive airway disease. No focal consolidation.   Electronically Signed   By: Carlos Levering M.D.   On: 06/13/2014 02:24     EKG Interpretation  None      MDM   Final diagnoses:  COPD exacerbation    2:54 AM Patient's labs unremarkable for acute changes. Patient's chest xray shows central peribronchial thickening. Patient on 4L oxygen nasal cannula. Patient will be admitted for COPD exacerbation.   3:42 AM Patient admitted to Dr. Posey Pronto.   Alvina Chou, PA-C 06/13/14 [place], MD 06/13/14 [phone]

## 2014-06-13 NOTE — Progress Notes (Signed)
  [place] TEAM 1 - Stepdown/ICU TEAM Progress Note  [name] [id number] DOB: 28-Apr-1960 DOA: 06/13/2014 PCP: [name], MD  Admit HPI / Brief Narrative: 54 y.o. male with history of coronary artery disease, COPD, DVT, and noncompliance with medical treatment who presented with complaints of cough and shortness of breath for last 2 weeks without any improvement.  Patient was recently hospitalized for the same.  Patient smokes 2-3 cigarettes on a daily basis.  HPI/Subjective: Pt is asking for "something stronger" for chest wall pain related to frequent coughing.    Assessment/Plan:  Acute respiratory failure with hypoxia - acute exacerbation COPD required 4 L of nasal cannula initially - CXR suggestive of acute v/s chronic bronchitis v/s RAD - pt CONTINUES TO SMOKE and does not take his medications   Coronary artery disease s/p stent in 2015 NSTEMI in July 2015 - was rescheduled for f/u w/ Cardiology during Feb 2016 admit, but again did not keep appointment   Hx DVT / PE Has an IVC filter + eliquis   HTN Has not been taking his medications  Noncompliance See CM note of 05/01/2014 - cost is reportedly NOT the issue - he reports frequent side effects and chooses to stop his meds    Code Status: FULL Family Communication: no family present at time of exam Disposition Plan: change status to med bed - resp status much improved   Consultants: none  Procedures: none  Antibiotics: Levaquin 3/19 >  DVT prophylaxis: apixaban  Objective: Blood pressure 136/84, pulse 83, temperature 97.4 F (36.3 C), temperature source Oral, resp. rate 23, SpO2 100 %.  Intake/Output Summary (Last 24 hours) at 06/13/14 1516 Last data filed at 06/13/14 1023  Gross per 24 hour  Intake      3 ml  Output      0 ml  Net      3 ml   Exam: F/u exam completed   Data Reviewed: Basic Metabolic Panel:  Recent Labs Lab 06/13/14 0125  NA 141  K 3.5  CL 112  CO2 19  GLUCOSE  132*  BUN 8  CREATININE 0.89  CALCIUM 9.0   Liver Function Tests: No results for input(s): AST, ALT, ALKPHOS, BILITOT, PROT, ALBUMIN in the last 168 hours. No results for input(s): LIPASE, AMYLASE in the last 168 hours. No results for input(s): AMMONIA in the last 168 hours.  CBC:  Recent Labs Lab 06/13/14 0125  WBC 4.2  HGB 11.9*  HCT 34.7*  MCV 91.8  PLT 124*    Cardiac Enzymes:  Recent Labs Lab 06/13/14 0732  TROPONINI <0.03   Studies:  Recent x-ray studies have been reviewed in detail by the Attending Physician  Scheduled Meds:  Scheduled Meds: . apixaban  2.5 mg Oral BID  . benzonatate  100 mg Oral TID  . guaiFENesin  600 mg Oral BID  . ipratropium-albuterol  3 mL Nebulization Q4H  . methylPREDNISolone (SOLU-MEDROL) injection  60 mg Intravenous Q6H  . mometasone-formoterol  2 puff Inhalation BID  . sodium chloride  3 mL Intravenous Q12H    Time spent on care of this patient: no charge    Cherene Altes , MD   [facility] Office  [phone] Pager - Text Page per Shea Evans as per below:  On-Call/Text Page:      [email or web address]      password [email or web address]  If 7PM-7AM, please contact night-coverage [email or web address] Password [email or web address] 06/13/2014, 3:16 PM   LOS: 0 days

## 2014-06-13 NOTE — ED Notes (Signed)
Admitting MD at bedside.

## 2014-06-13 NOTE — ED Notes (Signed)
Phlebotomy at the bedside  

## 2014-06-14 DIAGNOSIS — Z7901 Long term (current) use of anticoagulants: Secondary | ICD-10-CM

## 2014-06-14 DIAGNOSIS — J441 Chronic obstructive pulmonary disease with (acute) exacerbation: Secondary | ICD-10-CM

## 2014-06-14 MED ORDER — METHYLPREDNISOLONE SODIUM SUCC 125 MG IJ SOLR
60.0000 mg | Freq: Three times a day (TID) | INTRAMUSCULAR | Status: DC
  Administered 2014-06-14 – 2014-06-17 (×8): 60 mg via INTRAVENOUS
  Filled 2014-06-14 (×6): qty 2

## 2014-06-14 MED ORDER — MONTELUKAST SODIUM 10 MG PO TABS
10.0000 mg | ORAL_TABLET | Freq: Every day | ORAL | Status: DC
  Administered 2014-06-14 – 2014-06-17 (×4): 10 mg via ORAL
  Filled 2014-06-14 (×4): qty 1

## 2014-06-14 MED ORDER — METHYLPREDNISOLONE SODIUM SUCC 125 MG IJ SOLR
60.0000 mg | Freq: Three times a day (TID) | INTRAMUSCULAR | Status: DC
  Filled 2014-06-14: qty 2

## 2014-06-14 MED ORDER — LEVOFLOXACIN 750 MG PO TABS
750.0000 mg | ORAL_TABLET | Freq: Every day | ORAL | Status: DC
  Administered 2014-06-14 – 2014-06-18 (×5): 750 mg via ORAL
  Filled 2014-06-14 (×5): qty 1

## 2014-06-14 NOTE — Progress Notes (Signed)
[place] TEAM 1 - Stepdown/ICU TEAM Progress Note  [name] [id number] DOB: 06/21/1960 DOA: 06/13/2014 PCP: [name], MD  Admit HPI / Brief Narrative: 54 y.o. male with history of coronary artery disease, COPD, DVT, and noncompliance with medical treatment who presented with complaints of cough and shortness of breath for last 2 weeks without any improvement.  Patient was recently hospitalized for the same.  Patient smokes 2-3 cigarettes on a daily basis.  HPI/Subjective: Pt c/o chronic back pain, asking  "something stronger" for  Pain. Reported still wheeze.  Assessment/Plan:  Acute respiratory failure with hypoxia - acute exacerbation COPD required 4 L of nasal cannula initially - CXR suggestive of acute v/s chronic bronchitis v/s RAD - pt CONTINUES TO SMOKE and does not take his medications  On room air but Still diffuse wheezing on exam, increase steroid frequency, continue abx/nebs/mucolytis.  Reported frequent flare ups, most likely secondary to continued cigarette use and noncompliance. Will check IGE, start singulair.   Coronary artery disease s/p stent in 2015 NSTEMI in July 2015 - was rescheduled for f/u w/ Cardiology during Feb 2016 admit, but again did not keep appointment  EKG not acute changes.cardiac enzyme negative.  Hx DVT / PE Has an IVC filter + eliquis   HTN Has not been taking his medications  Noncompliance See CM note of 05/01/2014 - cost is reportedly NOT the issue - he reports frequent side effects and chooses to stop his meds    Code Status: FULL Family Communication: no family present at time of exam Disposition Plan: home once improves  Consultants: none  Procedures: none  Antibiotics: vancx1 on 3/19 Levaquin 3/19 >  DVT prophylaxis: apixaban  Objective: Blood pressure 144/77, pulse 77, temperature 98.2 F (36.8 C), temperature source Oral, resp. rate 18, height 6\' 2"  (1.88 m), weight 82.872 kg (182 lb 11.2 oz), SpO2 95  %.  Intake/Output Summary (Last 24 hours) at 06/14/14 2247 Last data filed at 06/14/14 1822  Gross per 24 hour  Intake    840 ml  Output      4 ml  Net    836 ml   Exam:   General: AAOx3, NAD  Cardiovascular: rrr  Respiratory: coarse breath sounds, diffuse wheezing bilaterally  Abdomen: +BS, soft         Musculoskeletal: no edema         Psych: poor insight  Data Reviewed: Basic Metabolic Panel:  Recent Labs Lab 06/13/14 0125  NA 141  K 3.5  CL 112  CO2 19  GLUCOSE 132*  BUN 8  CREATININE 0.89  CALCIUM 9.0   Liver Function Tests: No results for input(s): AST, ALT, ALKPHOS, BILITOT, PROT, ALBUMIN in the last 168 hours. No results for input(s): LIPASE, AMYLASE in the last 168 hours. No results for input(s): AMMONIA in the last 168 hours.  CBC:  Recent Labs Lab 06/13/14 0125  WBC 4.2  HGB 11.9*  HCT 34.7*  MCV 91.8  PLT 124*    Cardiac Enzymes:  Recent Labs Lab 06/13/14 0732 06/13/14 1423  TROPONINI <0.03 <0.03   Studies:  Recent x-ray studies have been reviewed in detail by the Attending Physician  Scheduled Meds:  Scheduled Meds: . apixaban  2.5 mg Oral BID  . benzonatate  100 mg Oral TID  . guaiFENesin  600 mg Oral BID  . ibuprofen  800 mg Oral TID  . ipratropium-albuterol  3 mL Nebulization QID  . levofloxacin  750 mg Oral Daily  . [START  ON 06/15/2014] methylPREDNISolone (SOLU-MEDROL) injection  60 mg Intravenous 3 times per day  . [START ON 06/15/2014] montelukast  10 mg Oral QHS  . sodium chloride  3 mL Intravenous Claudie Leach , MD /PhD  [id number] [facility] Office  [phone] Pager - Text Page per Amion as per below:  On-Call/Text Page:      [email or web address]      password [email or web address]  If 7PM-7AM, please contact night-coverage [email or web address] Password [email or web address] 06/14/2014, 10:47 PM   LOS: 1 day

## 2014-06-14 NOTE — Progress Notes (Signed)
UR Completed.  [phone]  

## 2014-06-15 ENCOUNTER — Inpatient Hospital Stay (HOSPITAL_COMMUNITY): Payer: Medicare Other

## 2014-06-15 LAB — RAPID URINE DRUG SCREEN, HOSP PERFORMED
Amphetamines: NOT DETECTED
BARBITURATES: NOT DETECTED
Benzodiazepines: NOT DETECTED
COCAINE: NOT DETECTED
Opiates: POSITIVE — AB
TETRAHYDROCANNABINOL: NOT DETECTED

## 2014-06-15 LAB — COMPREHENSIVE METABOLIC PANEL
ALT: 24 U/L (ref 0–53)
AST: 23 U/L (ref 0–37)
Albumin: 2.7 g/dL — ABNORMAL LOW (ref 3.5–5.2)
Alkaline Phosphatase: 98 U/L (ref 39–117)
Anion gap: 6 (ref 5–15)
BUN: 21 mg/dL (ref 6–23)
CO2: 25 mmol/L (ref 19–32)
CREATININE: 0.87 mg/dL (ref 0.50–1.35)
Calcium: 9.2 mg/dL (ref 8.4–10.5)
Chloride: 109 mmol/L (ref 96–112)
GLUCOSE: 154 mg/dL — AB (ref 70–99)
Potassium: 4.7 mmol/L (ref 3.5–5.1)
Sodium: 140 mmol/L (ref 135–145)
Total Bilirubin: 0.4 mg/dL (ref 0.3–1.2)
Total Protein: 5.4 g/dL — ABNORMAL LOW (ref 6.0–8.3)

## 2014-06-15 LAB — CBC WITH DIFFERENTIAL/PLATELET
Basophils Absolute: 0 10*3/uL (ref 0.0–0.1)
Basophils Relative: 0 % (ref 0–1)
EOS PCT: 0 % (ref 0–5)
Eosinophils Absolute: 0 10*3/uL (ref 0.0–0.7)
HCT: 34.4 % — ABNORMAL LOW (ref 39.0–52.0)
Hemoglobin: 11.4 g/dL — ABNORMAL LOW (ref 13.0–17.0)
LYMPHS ABS: 0.5 10*3/uL — AB (ref 0.7–4.0)
LYMPHS PCT: 13 % (ref 12–46)
MCH: 31.2 pg (ref 26.0–34.0)
MCHC: 33.1 g/dL (ref 30.0–36.0)
MCV: 94.2 fL (ref 78.0–100.0)
Monocytes Absolute: 0.2 10*3/uL (ref 0.1–1.0)
Monocytes Relative: 4 % (ref 3–12)
Neutro Abs: 3.4 10*3/uL (ref 1.7–7.7)
Neutrophils Relative %: 83 % — ABNORMAL HIGH (ref 43–77)
PLATELETS: 133 10*3/uL — AB (ref 150–400)
RBC: 3.65 MIL/uL — AB (ref 4.22–5.81)
RDW: 13.7 % (ref 11.5–15.5)
WBC: 4.1 10*3/uL (ref 4.0–10.5)

## 2014-06-15 LAB — HIV ANTIBODY (ROUTINE TESTING W REFLEX): HIV Screen 4th Generation wRfx: NONREACTIVE

## 2014-06-15 LAB — HEPATITIS PANEL, ACUTE
HCV AB: REACTIVE — AB
HEP A IGM: NONREACTIVE
Hep B C IgM: NONREACTIVE
Hepatitis B Surface Ag: NEGATIVE

## 2014-06-15 LAB — MAGNESIUM: MAGNESIUM: 2 mg/dL (ref 1.5–2.5)

## 2014-06-15 MED ORDER — FLUTICASONE PROPIONATE 50 MCG/ACT NA SUSP
2.0000 | Freq: Every day | NASAL | Status: DC
  Administered 2014-06-15 – 2014-06-16 (×2): 2 via NASAL
  Filled 2014-06-15: qty 16

## 2014-06-15 MED ORDER — IPRATROPIUM-ALBUTEROL 0.5-2.5 (3) MG/3ML IN SOLN
3.0000 mL | Freq: Three times a day (TID) | RESPIRATORY_TRACT | Status: DC
  Administered 2014-06-15 – 2014-06-17 (×7): 3 mL via RESPIRATORY_TRACT
  Filled 2014-06-15 (×7): qty 3

## 2014-06-15 MED ORDER — PANTOPRAZOLE SODIUM 40 MG PO TBEC
40.0000 mg | DELAYED_RELEASE_TABLET | Freq: Every day | ORAL | Status: DC
  Administered 2014-06-15 – 2014-06-17 (×3): 40 mg via ORAL
  Filled 2014-06-15 (×3): qty 1

## 2014-06-15 MED ORDER — KETOROLAC TROMETHAMINE 15 MG/ML IJ SOLN: 15.0000 mg | Freq: Three times a day (TID) | INTRAMUSCULAR | Status: DC | PRN

## 2014-06-15 MED ORDER — IBUPROFEN 800 MG PO TABS: 800.0000 mg | ORAL_TABLET | Freq: Three times a day (TID) | ORAL | Status: DC | PRN

## 2014-06-15 NOTE — Progress Notes (Signed)
[place] TEAM 1 - Stepdown/ICU TEAM Progress Note  [name] [id number] DOB: 24-Nov-1960 DOA: 06/13/2014 PCP: [name], MD  Admit HPI / Brief Narrative: 54 y.o. male with history of coronary artery disease, COPD, DVT, and noncompliance with medical treatment who presented with complaints of cough and shortness of breath for last 2 weeks without any improvement.  Patient was recently hospitalized for the same.  Patient smokes 2-3 cigarettes on a daily basis.  HPI/Subjective: Pt c/o chronic right flank pain, does not understand why he "can not get morphine" Reported has a knot at right flank region, that has been chronic.  less cough   Assessment/Plan:  Acute respiratory failure with hypoxia - acute exacerbation COPD required 4 L of nasal cannula initially - CXR suggestive of acute v/s chronic bronchitis v/s RAD - pt CONTINUES TO SMOKE and does not take his medications  On room air but Still diffuse wheezing on exam, increase steroid frequency, continue abx/nebs/mucolytis.  Reported frequent flare ups, most likely secondary to continued cigarette use and noncompliance. Will check IGE, start singulair.  Patient reported that he seems to be allergic to many things, he reported he sneeze a lot year round since he was a child. igE level pending.  Coronary artery disease s/p stent in 2015 NSTEMI in July 2015 - was rescheduled for f/u w/ Cardiology during Feb 2016 admit, but again did not keep appointment  EKG not acute changes.cardiac enzyme negative.  Hx DVT / PE Has an IVC filter + eliquis   HTN Has not been taking his medications  Right flank pain: requesting morphine, drug seeking? On exam, does has a movable soft tissue mass about 5-6cm long and 1-2cm wide. Does not seem to be significant tender, no erythema, no edema, no fluctuance, no associated skin lesion.  CT chest/ab for right flank pain.  Noncompliance See CM note of 05/01/2014 - cost is reportedly NOT the  issue - he reports frequent side effects and chooses to stop his meds   Education provided, advise patient to f/u with pmd/pulm/cards regularly.  Code Status: FULL Family Communication: no family present at time of exam, patient reported live with girlfriend and mom at home, he does drive. Disposition Plan: home once improves  Consultants: none  Procedures: none  Antibiotics: vancx1 on 3/19 Levaquin 3/19 >  DVT prophylaxis: apixaban  Objective: Blood pressure 140/77, pulse 63, temperature 97.7 F (36.5 C), temperature source Oral, resp. rate 18, height 6\' 2"  (1.88 m), weight 81.657 kg (180 lb 0.3 oz), SpO2 98 %.  Intake/Output Summary (Last 24 hours) at 06/15/14 1300 Last data filed at 06/15/14 1013  Gross per 24 hour  Intake    603 ml  Output      5 ml  Net    598 ml   Exam:   General: AAOx3, NAD  Cardiovascular: rrr  Respiratory: coarse breath sounds, less wheezing bilaterally  Abdomen: +BS, soft         Musculoskeletal: no edema         Psych: poor insight         Skin/right flank, see above  Data Reviewed: Basic Metabolic Panel:  Recent Labs Lab 06/13/14 0125 06/15/14 0420  NA 141 140  K 3.5 4.7  CL 112 109  CO2 19 25  GLUCOSE 132* 154*  BUN 8 21  CREATININE 0.89 0.87  CALCIUM 9.0 9.2  MG  --  2.0   Liver Function Tests:  Recent Labs Lab 06/15/14 0420  AST 23  ALT 24  ALKPHOS 98  BILITOT 0.4  PROT 5.4*  ALBUMIN 2.7*   No results for input(s): LIPASE, AMYLASE in the last 168 hours. No results for input(s): AMMONIA in the last 168 hours.  CBC:  Recent Labs Lab 06/13/14 0125 06/15/14 0420  WBC 4.2 4.1  NEUTROABS  --  3.4  HGB 11.9* 11.4*  HCT 34.7* 34.4*  MCV 91.8 94.2  PLT 124* 133*    Cardiac Enzymes:  Recent Labs Lab 06/13/14 0732 06/13/14 1423  TROPONINI <0.03 <0.03   Studies:  Recent x-ray studies have been reviewed in detail by the Attending Physician  Scheduled Meds:  Scheduled Meds: . apixaban  2.5 mg  Oral BID  . benzonatate  100 mg Oral TID  . fluticasone  2 spray Each Nare Daily  . guaiFENesin  600 mg Oral BID  . ipratropium-albuterol  3 mL Nebulization QID  . levofloxacin  750 mg Oral Daily  . methylPREDNISolone (SOLU-MEDROL) injection  60 mg Intravenous 3 times per day  . montelukast  10 mg Oral QHS  . sodium chloride  3 mL Intravenous Claudie Leach , MD /PhD  [id number] [facility] Office  [phone] Pager - Text Page per Amion as per below:  On-Call/Text Page:      [email or web address]      password [email or web address]  If 7PM-7AM, please contact night-coverage [email or web address] Password [email or web address] 06/15/2014, 1:00 PM   LOS: 2 days

## 2014-06-16 LAB — CBC
HCT: 33.3 % — ABNORMAL LOW (ref 39.0–52.0)
Hemoglobin: 10.9 g/dL — ABNORMAL LOW (ref 13.0–17.0)
MCH: 30.3 pg (ref 26.0–34.0)
MCHC: 32.7 g/dL (ref 30.0–36.0)
MCV: 92.5 fL (ref 78.0–100.0)
PLATELETS: 143 10*3/uL — AB (ref 150–400)
RBC: 3.6 MIL/uL — AB (ref 4.22–5.81)
RDW: 13.7 % (ref 11.5–15.5)
WBC: 3.9 10*3/uL — AB (ref 4.0–10.5)

## 2014-06-16 LAB — IGE: IgE (Immunoglobulin E), Serum: 157 [IU]/mL — ABNORMAL HIGH (ref 0–100)

## 2014-06-16 NOTE — Progress Notes (Signed)
[place] TEAM 1 - Stepdown/ICU TEAM Progress Note  [name] HCW:[id number] DOB: 10/14/1960 DOA: 06/13/2014 PCP: [name], MD  Admit HPI / Brief Narrative: 54 y.o. male with history of coronary artery disease, COPD, DVT, and noncompliance with medical treatment who presented with complaints of cough and shortness of breath for last 2 weeks without any improvement.  Patient was recently hospitalized for the same.  Patient smokes 2-3 cigarettes on a daily basis.  HPI/Subjective: Did not ask for pain meds this am ,reported feeling better.   Assessment/Plan:  Acute respiratory failure with hypoxia - acute exacerbation COPD required 4 L of nasal cannula initially - CXR suggestive of acute v/s chronic bronchitis v/s RAD - pt CONTINUES TO SMOKE and does not take his medications  On room air but Still diffuse wheezing on exam, increase steroid frequency, continue abx/nebs/mucolytis.  Reported frequent flare ups, most likely secondary to continued cigarette use and noncompliance.    IGE elevated at 157. start singulair. Patient reported that he seems to be allergic to many things, he reported he sneeze a lot yearround for the last 40yrs.  Explained patient that he will benefit from allergy specialist referral at discharge.  Coronary artery disease s/p stent in 2015 NSTEMI in July 2015 - was rescheduled for f/u w/ Cardiology during Feb 2016 admit, but again did not keep appointment  EKG not acute changes.cardiac enzyme negative.  Hx DVT / PE Has an IVC filter + eliquis   HTN Has not been taking his medications  Right flank pain: requesting morphine the first two days since admission. drug seeking? On exam, does has a movable soft tissue mass about 5-6cm long and 1-2cm wide.  Does not seem to be significant tender, no erythema, no edema, no fluctuance, no associated skin lesion.  CT chest/ab for right flank pain unremarkable on 3/21. Patient did not ask for morphine on  3/22.  Noncompliance See CM note of 05/01/2014 - cost is reportedly NOT the issue - he reports frequent side effects and chooses to stop his meds   Education provided, advise patient to f/u with pmd/pulm/cards regularly.  Code Status: FULL Family Communication: no family present at time of exam, patient reported live with girlfriend and mom at home, he does drive. Disposition Plan: home once improves  Consultants: none  Procedures: none  Antibiotics: vancx1 on 3/19 Levaquin 3/19 >  DVT prophylaxis: apixaban  Objective: Blood pressure 144/83, pulse 64, temperature 98.1 F (36.7 C), temperature source Oral, resp. rate 18, height 6\' 2"  (1.88 m), weight 85.004 kg (187 lb 6.4 oz), SpO2 95 %.  Intake/Output Summary (Last 24 hours) at 06/16/14 1229 Last data filed at 06/16/14 0500  Gross per 24 hour  Intake    240 ml  Output      8 ml  Net    232 ml   Exam:   General: AAOx3, NAD  Cardiovascular: rrr  Respiratory: still wheezing bilaterally, but improved  Abdomen: +BS, soft         Musculoskeletal: no edema         Psych: poor insight         Skin/right flank, see above  Data Reviewed: Basic Metabolic Panel:  Recent Labs Lab 06/13/14 0125 06/15/14 0420  NA 141 140  K 3.5 4.7  CL 112 109  CO2 19 25  GLUCOSE 132* 154*  BUN 8 21  CREATININE 0.89 0.87  CALCIUM 9.0 9.2  MG  --  2.0   Liver Function Tests:  Recent Labs Lab 06/15/14 0420  AST 23  ALT 24  ALKPHOS 98  BILITOT 0.4  PROT 5.4*  ALBUMIN 2.7*   No results for input(s): LIPASE, AMYLASE in the last 168 hours. No results for input(s): AMMONIA in the last 168 hours.  CBC:  Recent Labs Lab 06/13/14 0125 06/15/14 0420 06/16/14 0454  WBC 4.2 4.1 3.9*  NEUTROABS  --  3.4  --   HGB 11.9* 11.4* 10.9*  HCT 34.7* 34.4* 33.3*  MCV 91.8 94.2 92.5  PLT 124* 133* 143*    Cardiac Enzymes:  Recent Labs Lab 06/13/14 0732 06/13/14 1423  TROPONINI <0.03 <0.03   Studies:  Recent x-ray  studies have been reviewed in detail by the Attending Physician  Scheduled Meds:  Scheduled Meds: . apixaban  2.5 mg Oral BID  . benzonatate  100 mg Oral TID  . fluticasone  2 spray Each Nare Daily  . guaiFENesin  600 mg Oral BID  . ipratropium-albuterol  3 mL Nebulization TID  . levofloxacin  750 mg Oral Daily  . methylPREDNISolone (SOLU-MEDROL) injection  60 mg Intravenous 3 times per day  . montelukast  10 mg Oral QHS  . pantoprazole  40 mg Oral Daily  . sodium chloride  3 mL Intravenous Claudie Leach , MD /PhD  [phone] [facility] Office  [phone] Pager - Text Page per Amion as per below:  On-Call/Text Page:      [email or web address]      password [email or web address]  If 7PM-7AM, please contact night-coverage [email or web address] Password [email or web address] 06/16/2014, 12:29 PM   LOS: 3 days

## 2014-06-16 NOTE — Care Management Note (Signed)
  Page 1 of 1   06/16/2014     11:01:53 AM CARE MANAGEMENT NOTE 06/16/2014  Patient:  [name],[name]   Account Number:  [id number]  Date Initiated:  06/16/2014  Documentation initiated by:  Magdalen Spatz  Subjective/Objective Assessment:     Action/Plan:   Anticipated DC Date:  06/16/2014   Anticipated DC Plan:  HOME/SELF CARE         Choice offered to / List presented to:             Status of service:   Medicare Important Message given?  YES (If response is "NO", the following Medicare IM given date fields will be blank) Date Medicare IM given:  06/16/2014 Medicare IM given by:  Magdalen Spatz Date Additional Medicare IM given:   Additional Medicare IM given by:    Discharge Disposition:    Per UR Regulation:  Reviewed for med. necessity/level of care/duration of stay  If discussed at [place] of Stay Meetings, dates discussed:    Comments:

## 2014-06-17 LAB — CBC
HEMATOCRIT: 33 % — AB (ref 39.0–52.0)
Hemoglobin: 11 g/dL — ABNORMAL LOW (ref 13.0–17.0)
MCH: 30.6 pg (ref 26.0–34.0)
MCHC: 33.3 g/dL (ref 30.0–36.0)
MCV: 91.9 fL (ref 78.0–100.0)
Platelets: 144 10*3/uL — ABNORMAL LOW (ref 150–400)
RBC: 3.59 MIL/uL — ABNORMAL LOW (ref 4.22–5.81)
RDW: 13.6 % (ref 11.5–15.5)
WBC: 3.4 10*3/uL — AB (ref 4.0–10.5)

## 2014-06-17 LAB — BASIC METABOLIC PANEL
Anion gap: 4 — ABNORMAL LOW (ref 5–15)
BUN: 20 mg/dL (ref 6–23)
CALCIUM: 8.4 mg/dL (ref 8.4–10.5)
CO2: 27 mmol/L (ref 19–32)
CREATININE: 0.8 mg/dL (ref 0.50–1.35)
Chloride: 108 mmol/L (ref 96–112)
GFR calc Af Amer: >90 mL/min (ref >90)
GFR calc non Af Amer: >90 mL/min (ref >90)
GLUCOSE: 147 mg/dL — AB (ref 70–99)
Potassium: 4.2 mmol/L (ref 3.5–5.1)
Sodium: 139 mmol/L (ref 135–145)

## 2014-06-17 LAB — HCV RNA QUANT
HCV Quantitative Log: 6.47 {Log} — ABNORMAL HIGH (ref <1.18)
HCV Quantitative: 2982591 IU/mL — ABNORMAL HIGH (ref <15)

## 2014-06-17 MED ORDER — PREDNISONE 50 MG PO TABS
60.0000 mg | ORAL_TABLET | Freq: Every day | ORAL | Status: DC
  Administered 2014-06-17 – 2014-06-18 (×2): 60 mg via ORAL
  Filled 2014-06-17 (×3): qty 1

## 2014-06-17 MED ORDER — MOMETASONE FURO-FORMOTEROL FUM 100-5 MCG/ACT IN AERO
2.0000 | INHALATION_SPRAY | Freq: Two times a day (BID) | RESPIRATORY_TRACT | Status: DC
  Administered 2014-06-17: 2 via RESPIRATORY_TRACT
  Filled 2014-06-17: qty 8.8

## 2014-06-17 NOTE — Progress Notes (Signed)
Progress Note  [name] WJX:[id number] DOB: 07-31-1960 DOA: 06/13/2014 PCP: [name], MD  Admit HPI/Brief Narrative: 54 y.o. male with history of coronary artery disease, COPD, DVT, and noncompliance with medical treatment who presented with complaints of cough and shortness of breath for last 2 weeks without any improvement. Patient was recently hospitalized for the same. Patient smokes 2-3 cigarettes on a daily basis. He was admitted for further management.  Subjective: He states that he is feeling better. His breathing is improved. Still has wheezing.   Assessment/Plan:  Acute respiratory failure with hypoxia - acute exacerbation COPD Required 4 L of nasal cannula initially - CXR suggestive of acute v/s chronic bronchitis. Patient unfortunately continues to smoke. There is also some concern about noncompliance. Patient continues to have wheezing. But symptomatically he feels better. We will change him to oral steroids. Continue with Singulair. Add back his inhaled steroids. He was educated that he needs to take inhaled steroids every day without fail, even on days that he is feeling normal. Frequent flare ups most likely secondary to continued cigarette use and noncompliance. IgE level noted to be elevated. He should talk to his PCP about being referred to an allergist.   Coronary artery disease s/p stent in 2015 NSTEMI in July 2015 - was rescheduled for f/u w/ Cardiology during Feb 2016 admit, but again did not keep appointment. EKG not acute changes.cardiac enzyme negative.  Hx DVT / PE Has an IVC filter + eliquis   Essential HTN Has not been taking his medications  Right flank pain No etiology for same. CT scan of the abdomen and pelvis report was reviewed. He appears to have some tenderness over the lateral and posterior clear. No obvious masses noted. No right upper quadrant tenderness. LFTs were normal.   Noncompliance See CM note of 05/01/2014 - cost is reportedly  NOT the issue - he reports frequent side effects and chooses to stop his meds   Education provided, advise patient to f/u with pmd/pulm/cards regularly.  DVT prophylaxis: On Apixaban Code Status: FULL Family Communication: no family present at time of exam, patient reported live with girlfriend and mom at home, he does drive. Disposition Plan: home when better.  Consultants: none  Procedures: none  Antibiotics: vanc x1 on 3/19 Levaquin 3/19 >  Objective: Blood pressure 140/86, pulse 59, temperature 98.4 F (36.9 C), temperature source Oral, resp. rate 17, height 6\' 2"  (1.88 m), weight 86.32 kg (190 lb 4.8 oz), SpO2 99 %.  Intake/Output Summary (Last 24 hours) at 06/17/14 0805 Last data filed at 06/17/14 0650  Gross per 24 hour  Intake    480 ml  Output      6 ml  Net    474 ml   Exam:  General: AAOx3, NAD  Cardiovascular: S1, S2 is normal, regular. No S3, S4. No rubs, murmurs, or bruit  Respiratory: wheezing is heard bilaterally  Abdomen: +BS, soft  Musculoskeletal: no edema  Data Reviewed: Basic Metabolic Panel:  Recent Labs Lab 06/13/14 0125 06/15/14 0420 06/17/14 0515  NA 141 140 139  K 3.5 4.7 4.2  CL 112 109 108  CO2 19 25 27   GLUCOSE 132* 154* 147*  BUN 8 21 20   CREATININE 0.89 0.87 0.80  CALCIUM 9.0 9.2 8.4  MG  --  2.0  --    Liver Function Tests:  Recent Labs Lab 06/15/14 0420  AST 23  ALT 24  ALKPHOS 98  BILITOT 0.4  PROT 5.4*  ALBUMIN 2.7*  CBC:  Recent Labs Lab 06/13/14 0125 06/15/14 0420 06/16/14 0454 06/17/14 0515  WBC 4.2 4.1 3.9* 3.4*  NEUTROABS  --  3.4  --   --   HGB 11.9* 11.4* 10.9* 11.0*  HCT 34.7* 34.4* 33.3* 33.0*  MCV 91.8 94.2 92.5 91.9  PLT 124* 133* 143* 144*    Cardiac Enzymes:  Recent Labs Lab 06/13/14 0732 06/13/14 1423  TROPONINI <0.03 <0.03    Scheduled Meds:  Scheduled Meds: . apixaban  2.5 mg Oral BID  . benzonatate  100 mg Oral TID  . fluticasone  2 spray Each Nare Daily  .  guaiFENesin  600 mg Oral BID  . ipratropium-albuterol  3 mL Nebulization TID  . levofloxacin  750 mg Oral Daily  . mometasone-formoterol  2 puff Inhalation BID  . montelukast  10 mg Oral QHS  . pantoprazole  40 mg Oral Daily  . predniSONE  60 mg Oral Q breakfast  . sodium chloride  3 mL Intravenous [name] , MD  Pager: [place]  [phone]  Pager - Text Page per [name] as per below:  On-Call/Text Page:      [name]      password [email or web address]  If 7PM-7AM, please contact night-coverage [email or web address] Password [email or web address]  06/17/2014, 8:05 AM   LOS: 4 days

## 2014-06-18 DIAGNOSIS — B182 Chronic viral hepatitis C: Secondary | ICD-10-CM | POA: Diagnosis present

## 2014-06-18 MED ORDER — FLUTICASONE PROPIONATE 50 MCG/ACT NA SUSP: 2.0000 | Freq: Every day | NASAL | Status: DC

## 2014-06-18 MED ORDER — MONTELUKAST SODIUM 10 MG PO TABS: 10.0000 mg | ORAL_TABLET | Freq: Every day | ORAL | Status: DC

## 2014-06-18 MED ORDER — PREDNISONE 20 MG PO TABS: ORAL_TABLET | ORAL | Status: DC

## 2014-06-18 MED ORDER — FLUTICASONE-SALMETEROL 250-50 MCG/DOSE IN AEPB: 1.0000 | INHALATION_SPRAY | Freq: Two times a day (BID) | RESPIRATORY_TRACT | Status: DC

## 2014-06-18 NOTE — Discharge Summary (Signed)
[facility]  Physician Discharge Summary   Patient ID: [name] MRN: [id number] DOB/AGE: 54/01/1961 54 y.o.  Admit date: 06/13/2014 Discharge date: 06/18/2014  PCP: WILLIAMS,DWIGHT M, MD  DISCHARGE DIAGNOSES:  Principal Problem:   Acute respiratory failure with hypoxia Active Problems:   History of noncompliance with medical treatment   CAD (coronary artery disease)   COPD exacerbation   Asthma exacerbation   DVT (deep venous thrombosis)   Essential hypertension   Hepatitis C   RECOMMENDATIONS FOR OUTPATIENT FOLLOW UP: 1. Patient instructed to discuss his newly detected hepatitis C with his primary care physician.  2. He is also being given the contact information for the infectious disease specialist for discussing treatment options.  3. PCP to consider referral to allergist.  DISCHARGE CONDITION: fair  Diet recommendation: Heart healthy  Filed Weights   06/16/14 0553 06/17/14 0648 06/18/14 0527  Weight: 85.004 kg (187 lb 6.4 oz) 86.32 kg (190 lb 4.8 oz) 86.566 kg (190 lb 13.5 oz)    INITIAL HISTORY: 54 y.o. male with history of coronary artery disease, COPD, DVT, and noncompliance with medical treatment who presented with complaints of cough and shortness of breath for last 2 weeks without any improvement. Patient was recently hospitalized for the same. Patient smokes 2-3 cigarettes on a daily basis. He was admitted for further management.  Consultations:  None  Procedures:  None  HOSPITAL COURSE:   Acute respiratory failure with hypoxia - acute exacerbation COPD Agent was admitted to the hospital and started on steroids and antibiotics. He required 4 L of nasal cannula initially. CXR suggestive of acute v/s chronic bronchitis. Patient unfortunately continues to smoke. There is also some concern about noncompliance. Patient slowly started improving. He was changed over to oral steroids. He was started on Singulair. He was also reinitiated on his  inhaled steroids. Today, he is breathing much better. Wheezing has diminished significantly. IgE level noted to be elevated. He should talk to his PCP about being referred to an allergist.    Newly diagnosed hepatitis C Patient was noted to have a reactive hepatitis C antibody. Titers were obtained. HCV quantitative was greater than 2,900,000. When patient was informed of this he got very upset. He refused to listen to me any further. I strongly advised him to talk about this further with his PCP. I have also given contact information for infectious disease specialist.  Coronary artery disease s/p stent in 2015 NSTEMI in July 2015 - was rescheduled for f/u w/ Cardiology during Feb 2016 admit, but again did not keep appointment. EKG without acute changes. Cardiac enzyme negative.  Hx DVT / PE Has an IVC filter + eliquis. Continue with same.  Essential HTN Continue with his home medications.  Right flank pain No etiology for same. CT scan of the abdomen and pelvis report was reviewed. He appears to have some tenderness over the lateral and posterior clear. No obvious masses noted. No right upper quadrant tenderness. Ultrasound was inconclusive. But his LFTs were normal. His symptoms subsequently resolved. Further workup as an outpatient if his symptoms recur.  Patient was asked to be compliant with his medication regimen and with follow-up.  He was asked especially follow-up with his PCP regarding his newly diagnosed hep C. Patient very upset about this new diagnosis.  He otherwise was medically stable and cleared for discharge.  PERTINENT LABS:  The results of significant diagnostics from this hospitalization (including imaging, microbiology, ancillary and laboratory) are listed below for reference.       Labs: Basic Metabolic Panel:  Recent Labs Lab 06/13/14 0125 06/15/14 0420 06/17/14 0515  NA 141 140 139  K 3.5 4.7 4.2  CL 112 109 108  CO2 19 25 27  GLUCOSE 132* 154* 147*    BUN 8 21 20  CREATININE 0.89 0.87 0.80  CALCIUM 9.0 9.2 8.4  MG  --  2.0  --    Liver Function Tests:  Recent Labs Lab 06/15/14 0420  AST 23  ALT 24  ALKPHOS 98  BILITOT 0.4  PROT 5.4*  ALBUMIN 2.7*   CBC:  Recent Labs Lab 06/13/14 0125 06/15/14 0420 06/16/14 0454 06/17/14 0515  WBC 4.2 4.1 3.9* 3.4*  NEUTROABS  --  3.4  --   --   HGB 11.9* 11.4* 10.9* 11.0*  HCT 34.7* 34.4* 33.3* 33.0*  MCV 91.8 94.2 92.5 91.9  PLT 124* 133* 143* 144*   Cardiac Enzymes:  Recent Labs Lab 06/13/14 0732 06/13/14 1423  TROPONINI <0.03 <0.03   BNP: BNP (last 3 results)  Recent Labs  03/25/14 1837  BNP 30.4    ProBNP (last 3 results)  Recent Labs  01/26/14 0958  PROBNP 1619.0*    IMAGING STUDIES Ct Abdomen Pelvis Wo Contrast  06/16/2014   CLINICAL DATA:  Right flank pain  EXAM: CT CHEST AND ABDOMEN WITHOUT CONTRAST  TECHNIQUE: Multidetector CT imaging of the chest and abdomen was performed following the standard protocol without intravenous contrast.  COMPARISON:: COMPARISON: 03/25/2014  FINDINGS: CT CHEST FINDINGS  There is no mediastinal or hilar adenopathy. There is extensive calcified coronary artery disease. There are no pleural effusions. There are no airspace opacities. There is bronchial wall thickening, less severe than on 03/25/2014 when there probably was acute on chronic bronchitis. The bronchial wall thickening on today's examination is similar to older studies and likely represents a chronic bronchitic process. Central airways are patent.  CT ABDOMEN FINDINGS  There are unremarkable unenhanced appearances of the liver, spleen, pancreas, adrenals and kidneys. There are no urinary calculi. There is no hydronephrosis or ureteral dilatation. Bowel as unremarkable in appearance. Appendix is normal. Abdominal aorta is normal in caliber with mild atherosclerotic calcifications. There is more heavily calcified plaque in the common iliac arteries bilaterally.  No acute  inflammatory changes are evident in the abdomen or pelvis. There is no adenopathy. There is no ascites.  IVC filter incidentally noted.  No acute musculoskeletal abnormalities are evident.  IMPRESSION: 1. Extensive chronic bronchial wall thickening consistent with chronic bronchitis. No superimposed acute airspace opacities. No effusions. 2. No acute findings are evident in the abdomen or pelvis. 3. Calcified coronary disease. 4. Bulky calcified plaque in the common iliac arteries bilaterally.   Electronically Signed   By: Daniel R [place] M.D.   On: 06/16/2014 02:57   Ct Chest Wo Contrast  06/16/2014   CLINICAL DATA:  Right flank pain  EXAM: CT CHEST AND ABDOMEN WITHOUT CONTRAST  TECHNIQUE: Multidetector CT imaging of the chest and abdomen was performed following the standard protocol without intravenous contrast.  COMPARISON:: COMPARISON: 03/25/2014  FINDINGS: CT CHEST FINDINGS  There is no mediastinal or hilar adenopathy. There is extensive calcified coronary artery disease. There are no pleural effusions. There are no airspace opacities. There is bronchial wall thickening, less severe than on 03/25/2014 when there probably was acute on chronic bronchitis. The bronchial wall thickening on today's examination is similar to older studies and likely represents a chronic bronchitic process. Central airways are patent.  CT ABDOMEN FINDINGS  There are unremarkable   unenhanced appearances of the liver, spleen, pancreas, adrenals and kidneys. There are no urinary calculi. There is no hydronephrosis or ureteral dilatation. Bowel as unremarkable in appearance. Appendix is normal. Abdominal aorta is normal in caliber with mild atherosclerotic calcifications. There is more heavily calcified plaque in the common iliac arteries bilaterally.  No acute inflammatory changes are evident in the abdomen or pelvis. There is no adenopathy. There is no ascites.  IVC filter incidentally noted.  No acute musculoskeletal abnormalities  are evident.  IMPRESSION: 1. Extensive chronic bronchial wall thickening consistent with chronic bronchitis. No superimposed acute airspace opacities. No effusions. 2. No acute findings are evident in the abdomen or pelvis. 3. Calcified coronary disease. 4. Bulky calcified plaque in the common iliac arteries bilaterally.   Electronically Signed   By: Andreas Newport M.D.   On: 06/16/2014 02:57   Dg Chest Portable 1 View  06/13/2014   CLINICAL DATA:  Asthma, shortness of breath.  EXAM: PORTABLE CHEST - 1 VIEW  COMPARISON:  04/27/2014  FINDINGS: Cardiomediastinal contours within normal range. Mild central peribronchial thickening. No confluent airspace opacity, pleural effusion, pneumothorax. Multilevel degenerative changes.  IMPRESSION: Mild central peribronchial thickening may reflect bronchitis (acute or chronic) versus reactive airway disease. No focal consolidation.   Electronically Signed   By: Carlos Levering M.D.   On: 06/13/2014 02:24   US Abdomen Limited Ruq  06/15/2014   CLINICAL DATA:  Elevated LFTs.  EXAM: US ABDOMEN LIMITED - RIGHT UPPER QUADRANT  COMPARISON:  None.  FINDINGS: Gallbladder:  Gallbladder is contracted. No gallstones noted. Gallbladder wall thickness is 3.9 mm. This may be related to contracted state as opposed to cholecystitis. Cholecystitis cannot be entirely excluded. Negative Murphy sign. No pericholecystic fluid collections.  Common bile duct:  Diameter: 4.3 mm  Liver:  No focal lesion identified. Within normal limits in parenchymal echogenicity.  IMPRESSION: Contracted gallbladder. No gallstones. Gallbladder wall is slightly thickened, this may be related to contracted state. Cholecystitis cannot be completely excluded.   Electronically Signed   By: Marcello Moores  Register   On: 06/15/2014 07:22    DISCHARGE EXAMINATION: Filed Vitals:   06/17/14 1500 06/17/14 2100 06/17/14 2116 06/18/14 0527  BP: 135/74  145/84 149/86  Pulse: 69 69 76 59  Temp: 98.6 F (37 C)  98.2 F (36.8  C) 97.5 F (36.4 C)  TempSrc: Oral  Oral   Resp: _0 Height:      Weight:    86.566 kg (190 lb 13.5 oz)  SpO2: 98% 99% 98% 98%   General appearance: alert, cooperative, appears stated age and no distress Resp: Much improved air entry bilaterally. Very few wheezes. Cardio: regular rate and rhythm, S1, S2 normal, no murmur, click, rub or gallop GI: soft, non-tender; bowel sounds normal; no masses,  no organomegaly  DISPOSITION: Home  Discharge Instructions    Call MD for:  difficulty breathing, headache or visual disturbances    Complete by:  As directed      Call MD for:  extreme fatigue    Complete by:  As directed      Call MD for:  persistant dizziness or light-headedness    Complete by:  As directed      Call MD for:  persistant nausea and vomiting    Complete by:  As directed      Diet - low sodium heart healthy    Complete by:  As directed      Discharge instructions    Complete  by:  As directed   Please be sure to discuss the results of Hepatitis C with your PCP. Contact number for the Hep C specialist has been provided as well. Refrain from smoking. Be compliant with your inhalers. Take Advair as instructed and even on days you are feeling well. It has to be taken consistently for it to help you.     Increase activity slowly    Complete by:  As directed            ALLERGIES: No Known Allergies   Discharge Medication List as of 06/18/2014  8:37 AM    START taking these medications   Details  fluticasone (FLONASE) 50 MCG/ACT nasal spray Place 2 sprays into both nostrils daily., Starting 06/18/2014, Until Discontinued, Print    montelukast (SINGULAIR) 10 MG tablet Take 1 tablet (10 mg total) by mouth at bedtime., Starting 06/18/2014, Until Discontinued, Print    predniSONE (DELTASONE) 20 MG tablet Take 3 tablets once daily for 3 days, then take 2 tablets once daily for 3 days, then take 1 tablet once daily for 3 days then stop., Print      CONTINUE these  medications which have CHANGED   Details  Fluticasone-Salmeterol (ADVAIR DISKUS) 250-50 MCG/DOSE AEPB Inhale 1 puff into the lungs 2 (two) times daily., Starting 06/18/2014, Until Discontinued, Print      CONTINUE these medications which have NOT CHANGED   Details  albuterol (PROVENTIL HFA;VENTOLIN HFA) 108 (90 BASE) MCG/ACT inhaler Inhale 2 puffs into the lungs every 6 (six) hours as needed for wheezing or shortness of breath., Starting 05/01/2014, Until Discontinued, Normal    apixaban (ELIQUIS) 2.5 MG TABS tablet Take 1 tablet (2.5 mg total) by mouth 2 (two) times daily., Starting 10/27/2013, Until Discontinued, Print    atorvastatin (LIPITOR) 80 MG tablet Take 1 tablet (80 mg total) by mouth daily at 6 PM., Starting 10/27/2013, Until Discontinued, Print    metoprolol tartrate (LOPRESSOR) 25 MG tablet Take 0.5 tablets (12.5 mg total) by mouth 2 (two) times daily., Starting 05/01/2014, Until Discontinued, Normal    nitroGLYCERIN (NITROSTAT) 0.4 MG SL tablet Place 1 tablet (0.4 mg total) under the tongue every 5 (five) minutes as needed for chest pain (CP or SOB)., Starting 10/27/2013, Until Discontinued, Print    tiotropium (SPIRIVA) 18 MCG inhalation capsule Place 1 capsule (18 mcg total) into inhaler and inhale daily., Starting 05/01/2014, Until Discontinued, Normal      STOP taking these medications     doxycycline (VIBRA-TABS) 100 MG tablet      prasugrel (EFFIENT) 10 MG TABS tablet      PredniSONE 10 MG KIT        Follow-up Information    Follow up with [name], MD. Schedule an appointment as soon as possible for a visit in 1 week.   Specialty:  Specialist   Why:  to discuss results of hepatitis C test and to discuss treatment options.   Contact information:   [place] [address] [phone]       Follow up with Scharlene Gloss, MD.   Specialty:  Infectious Diseases   Why:  call for appointment to discuss treatment options for Hep C.   Contact  information:   [address] [place] [address] [phone]       TOTAL DISCHARGE TIME: 35 minutes  [facility] Pager [phone]  06/18/2014, 1:02 PM

## 2014-06-18 NOTE — Progress Notes (Signed)
Patient given discharge paperwork. RN attempted to review paperwork. Patient does not want to go over discharge paperwork. He wants to leave because he is upset that the doctor told him he has Hep C. Prescriptions given to patient. IV removed.

## 2014-06-18 NOTE — Discharge Instructions (Signed)
Hepatitis C °Hepatitis C is a viral infection of the liver. Infection may go undetected for months or years because symptoms may be absent or very mild. Chronic liver disease is the main danger of hepatitis C. This may lead to scarring of the liver (cirrhosis), liver failure, and liver cancer. °CAUSES  °Hepatitis C is caused by the hepatitis C virus (HCV). Formerly, hepatitis C infections were most commonly transmitted through blood transfusions. In the early 1990s, routine testing of donated blood for hepatitis C and exclusion of blood that tests positive for HCV began. Now, HCV is most commonly transmitted from person to person through injection drug use, sharing needles, or sex with an infected person. A caregiver may also get the infection from exposure to the blood of an infected patient by way of a cut or needle stick.  °SYMPTOMS  °Acute Phase °Many cases of acute HCV infection are mild and cause few problems. Some people may not even realize they are sick. Symptoms in others may last a few weeks to several months and include: °· Feeling very tired. °· Loss of appetite. °· Nausea. °· Vomiting. °· Abdominal pain. °· Dark yellow urine. °· Yellow skin and eyes (jaundice). °· Itching of the skin. °Chronic Phase °· Between 50% to 85% of people who get HCV infection become "chronic carriers." They often have no symptoms, but the virus stays in their body. They may spread the virus to others and can get long-term liver disease. °· Many people with chronic HCV infection remain healthy for many years. However, up to 1 in 5 chronically infected people may develop severe liver diseases including scarring of the liver (cirrhosis), liver failure, or liver cancer. °DIAGNOSIS  °Diagnosis of hepatitis C infection is made by testing blood for the presence of hepatitis C viral particles called RNA. Other tests may also be done to measure the status of current liver function, exclude other liver problems, or assess liver  damage. °TREATMENT  °Treatment with many antiviral drugs is available and recommended for some patients with chronic HCV infection. Drug treatment is generally considered appropriate for patients who: °· Are 18 years of age or older. °· Have a positive test for HCV particles in the blood. °· Have a liver tissue sample (biopsy) that shows chronic hepatitis and significant scarring (fibrosis). °· Do not have signs of liver failure. °· Have acceptable blood test results that confirm the wellness of other body organs. °· Are willing to be treated and conform to treatment requirements. °· Have no other circumstances that would prevent treatment from being recommended (contraindications). °All people who are offered and choose to receive drug treatment must understand that careful medical follow up for many months and even years is crucial in order to make successful care possible. The goal of drug treatment is to eliminate any evidence of HCV in the blood on a long-term basis. This is called a "sustained virologic response" or SVR. Achieving a SVR is associated with a decrease in the chance of life-threatening liver problems, need for a liver transplant, liver cancer rates, and liver-related complications. °Successful treatment currently requires taking treatment drugs for at least 24 weeks and up to 72 weeks. An injected drug (interferon) given weekly and an oral antiviral medicine taken daily are usually prescribed. Side effects from these drugs are common and some may be very serious. Your response to treatment must be carefully monitored by both you and your caregiver throughout the entire treatment period. °PREVENTION °There is no vaccine for hepatitis C. The only   way to prevent the disease is to reduce the risk of exposure to the virus.   Avoid sharing drug needles or personal items like toothbrushes, razors, and nail clippers with an infected person.  Healthcare workers need to avoid injuries and wear  appropriate protective equipment such as gloves, gowns, and face masks when performing invasive medical or nursing procedures. HOME CARE INSTRUCTIONS  To avoid making your liver disease worse:  Strictly avoid drinking alcohol.  Carefully review all new prescriptions of medicines with your caregiver. Ask your caregiver which drugs you should avoid. The following drugs are toxic to the liver, and your caregiver may tell you to avoid them:  Isoniazid.  Methyldopa.  Acetaminophen.  Anabolic steroids (muscle-building drugs).  Erythromycin.  Oral contraceptives (birth control pills).  Check with your caregiver to make sure medicine you are currently taking will not be harmful.  Periodic blood tests may be required. Follow your caregiver's advice about when you should have blood tests.  Avoid a sexual relationship until advised otherwise by your caregiver.  Avoid activities that could expose other people to your blood. Examples include sharing a toothbrush, nail clippers, razors, and needles.  Bed rest is not necessary, but it may make you feel better. Recovery time is not related to the amount of rest you receive.  This infection is contagious. Follow your caregiver's instructions in order to avoid spread of the infection. SEEK IMMEDIATE MEDICAL CARE IF:  You have increasing fatigue or weakness.  You have an oral temperature above 102 F (38.9 C), not controlled by medicine.  You develop loss of appetite, nausea, or vomiting.  You develop jaundice.  You develop easy bruising or bleeding.  You develop any severe problems as a result of your treatment. MAKE SURE YOU:   Understand these instructions.  Will watch your condition.  Will get help right away if you are not doing well or get worse. Document Released: 03/10/2000 Document Revised: 06/05/2011 Document Reviewed: 06/25/2013 [facility] Patient Information 2015 [place], [place]. This information is not intended to replace  advice given to you by your health care provider. Make sure you discuss any questions you have with your health care provider.   Chronic Obstructive Pulmonary Disease Chronic obstructive pulmonary disease (COPD) is a common lung condition in which airflow from the lungs is limited. COPD is a general term that can be used to describe many different lung problems that limit airflow, including both chronic bronchitis and emphysema. If you have COPD, your lung function will probably never return to normal, but there are measures you can take to improve lung function and make yourself feel better.  CAUSES   Smoking (common).   Exposure to secondhand smoke.   Genetic problems.  Chronic inflammatory lung diseases or recurrent infections. SYMPTOMS   Shortness of breath, especially with physical activity.   Deep, persistent (chronic) cough with a large amount of thick mucus.   Wheezing.   Rapid breaths (tachypnea).   Gray or bluish discoloration (cyanosis) of the skin, especially in fingers, toes, or lips.   Fatigue.   Weight loss.   Frequent infections or episodes when breathing symptoms become much worse (exacerbations).   Chest tightness. DIAGNOSIS  Your health care provider will take a medical history and perform a physical examination to make the initial diagnosis. Additional tests for COPD may include:   Lung (pulmonary) function tests.  Chest X-ray.  CT scan.  Blood tests. TREATMENT  Treatment available to help you feel better when you have COPD  includes:   Inhaler and nebulizer medicines. These help manage the symptoms of COPD and make your breathing more comfortable.  Supplemental oxygen. Supplemental oxygen is only helpful if you have a low oxygen level in your blood.   Exercise and physical activity. These are beneficial for nearly all people with COPD. Some people may also benefit from a pulmonary rehabilitation program. HOME CARE INSTRUCTIONS    Take all medicines (inhaled or pills) as directed by your health care provider.  Avoid over-the-counter medicines or cough syrups that dry up your airway (such as antihistamines) and slow down the elimination of secretions unless instructed otherwise by your health care provider.   If you are a smoker, the most important thing that you can do is stop smoking. Continuing to smoke will cause further lung damage and breathing trouble. Ask your health care provider for help with quitting smoking. He or she can direct you to community resources or hospitals that provide support.  Avoid exposure to irritants such as smoke, chemicals, and fumes that aggravate your breathing.  Use oxygen therapy and pulmonary rehabilitation if directed by your health care provider. If you require home oxygen therapy, ask your health care provider whether you should purchase a pulse oximeter to measure your oxygen level at home.   Avoid contact with individuals who have a contagious illness.  Avoid extreme temperature and humidity changes.  Eat healthy foods. Eating smaller, more frequent meals and resting before meals may help you maintain your strength.  Stay active, but balance activity with periods of rest. Exercise and physical activity will help you maintain your ability to do things you want to do.  Preventing infection and hospitalization is very important when you have COPD. Make sure to receive all the vaccines your health care provider recommends, especially the pneumococcal and influenza vaccines. Ask your health care provider whether you need a pneumonia vaccine.  Learn and use relaxation techniques to manage stress.  Learn and use controlled breathing techniques as directed by your health care provider. Controlled breathing techniques include:   Pursed lip breathing. Start by breathing in (inhaling) through your nose for 1 second. Then, purse your lips as if you were going to whistle and breathe  out (exhale) through the pursed lips for 2 seconds.   Diaphragmatic breathing. Start by putting one hand on your abdomen just above your waist. Inhale slowly through your nose. The hand on your abdomen should move out. Then purse your lips and exhale slowly. You should be able to feel the hand on your abdomen moving in as you exhale.   Learn and use controlled coughing to clear mucus from your lungs. Controlled coughing is a series of short, progressive coughs. The steps of controlled coughing are:   Lean your head slightly forward.   Breathe in deeply using diaphragmatic breathing.   Try to hold your breath for 3 seconds.   Keep your mouth slightly open while coughing twice.   Spit any mucus out into a tissue.   Rest and repeat the steps once or twice as needed. SEEK MEDICAL CARE IF:   You are coughing up more mucus than usual.   There is a change in the color or thickness of your mucus.   Your breathing is more labored than usual.   Your breathing is faster than usual.  SEEK IMMEDIATE MEDICAL CARE IF:   You have shortness of breath while you are resting.   You have shortness of breath that prevents you from:  Being able to talk.   Performing your usual physical activities.   You have chest pain lasting longer than 5 minutes.   Your skin color is more cyanotic than usual.  You measure low oxygen saturations for longer than 5 minutes with a pulse oximeter. MAKE SURE YOU:   Understand these instructions.  Will watch your condition.  Will get help right away if you are not doing well or get worse. Document Released: 12/21/2004 Document Revised: 07/28/2013 Document Reviewed: 11/07/2012 [facility] Patient Information 2015 [place], [place]. This information is not intended to replace advice given to you by your health care provider. Make sure you discuss any questions you have with your health care provider.

## 2014-10-07 ENCOUNTER — Emergency Department (HOSPITAL_COMMUNITY): Payer: Medicare Other

## 2014-10-07 ENCOUNTER — Emergency Department (HOSPITAL_COMMUNITY)
Admission: EM | Admit: 2014-10-07 | Discharge: 2014-10-07 | Disposition: A | Discharge status: Home / Self Care | Payer: Medicare Other | Attending: Emergency Medicine | Admitting: Emergency Medicine

## 2014-10-07 ENCOUNTER — Encounter (HOSPITAL_COMMUNITY): Payer: Self-pay | Admitting: Emergency Medicine

## 2014-10-07 DIAGNOSIS — Z86711 Personal history of pulmonary embolism: Secondary | ICD-10-CM | POA: Diagnosis not present

## 2014-10-07 DIAGNOSIS — Z720 Tobacco use: Secondary | ICD-10-CM | POA: Diagnosis not present

## 2014-10-07 DIAGNOSIS — Z8739 Personal history of other diseases of the musculoskeletal system and connective tissue: Secondary | ICD-10-CM | POA: Diagnosis not present

## 2014-10-07 DIAGNOSIS — I509 Heart failure, unspecified: Secondary | ICD-10-CM | POA: Insufficient documentation

## 2014-10-07 DIAGNOSIS — Z79899 Other long term (current) drug therapy: Secondary | ICD-10-CM | POA: Insufficient documentation

## 2014-10-07 DIAGNOSIS — Z86718 Personal history of other venous thrombosis and embolism: Secondary | ICD-10-CM | POA: Diagnosis not present

## 2014-10-07 DIAGNOSIS — J45909 Unspecified asthma, uncomplicated: Secondary | ICD-10-CM | POA: Diagnosis not present

## 2014-10-07 DIAGNOSIS — R0602 Shortness of breath: Secondary | ICD-10-CM | POA: Diagnosis present

## 2014-10-07 DIAGNOSIS — I252 Old myocardial infarction: Secondary | ICD-10-CM | POA: Insufficient documentation

## 2014-10-07 DIAGNOSIS — I10 Essential (primary) hypertension: Secondary | ICD-10-CM | POA: Diagnosis not present

## 2014-10-07 DIAGNOSIS — I2510 Atherosclerotic heart disease of native coronary artery without angina pectoris: Secondary | ICD-10-CM | POA: Insufficient documentation

## 2014-10-07 DIAGNOSIS — Z7952 Long term (current) use of systemic steroids: Secondary | ICD-10-CM | POA: Insufficient documentation

## 2014-10-07 DIAGNOSIS — R05 Cough: Secondary | ICD-10-CM | POA: Diagnosis not present

## 2014-10-07 DIAGNOSIS — J441 Chronic obstructive pulmonary disease with (acute) exacerbation: Secondary | ICD-10-CM

## 2014-10-07 DIAGNOSIS — Z8701 Personal history of pneumonia (recurrent): Secondary | ICD-10-CM | POA: Insufficient documentation

## 2014-10-07 DIAGNOSIS — F17210 Nicotine dependence, cigarettes, uncomplicated: Secondary | ICD-10-CM | POA: Diagnosis not present

## 2014-10-07 LAB — CBC WITH DIFFERENTIAL/PLATELET
BASOS ABS: 0 10*3/uL (ref 0.0–0.1)
BASOS PCT: 1 % (ref 0–1)
EOS ABS: 0.4 10*3/uL (ref 0.0–0.7)
Eosinophils Relative: 12 % — ABNORMAL HIGH (ref 0–5)
HCT: 45.4 % (ref 39.0–52.0)
Hemoglobin: 15.4 g/dL (ref 13.0–17.0)
Lymphocytes Relative: 42 % (ref 12–46)
Lymphs Abs: 1.3 10*3/uL (ref 0.7–4.0)
MCH: 31.9 pg (ref 26.0–34.0)
MCHC: 33.9 g/dL (ref 30.0–36.0)
MCV: 94 fL (ref 78.0–100.0)
Monocytes Absolute: 0.4 10*3/uL (ref 0.1–1.0)
Monocytes Relative: 12 % (ref 3–12)
NEUTROS PCT: 33 % — AB (ref 43–77)
Neutro Abs: 1 10*3/uL — ABNORMAL LOW (ref 1.7–7.7)
Platelets: 128 10*3/uL — ABNORMAL LOW (ref 150–400)
RBC: 4.83 MIL/uL (ref 4.22–5.81)
RDW: 13 % (ref 11.5–15.5)
WBC: 3.1 10*3/uL — AB (ref 4.0–10.5)

## 2014-10-07 LAB — BASIC METABOLIC PANEL
Anion gap: 13 (ref 5–15)
BUN: 11 mg/dL (ref 6–20)
CO2: 21 mmol/L — AB (ref 22–32)
Calcium: 9.6 mg/dL (ref 8.9–10.3)
Chloride: 105 mmol/L (ref 101–111)
Creatinine, Ser: 1 mg/dL (ref 0.61–1.24)
GFR calc non Af Amer: >60 mL/min (ref >60)
Glucose, Bld: 100 mg/dL — ABNORMAL HIGH (ref 65–99)
Potassium: 3.6 mmol/L (ref 3.5–5.1)
Sodium: 139 mmol/L (ref 135–145)

## 2014-10-07 LAB — TROPONIN I: Troponin I: <0.03 ng/mL (ref <0.031)

## 2014-10-07 LAB — D-DIMER, QUANTITATIVE: D-Dimer, Quant: 1.18 ug/mL-FEU — ABNORMAL HIGH (ref 0.00–0.48)

## 2014-10-07 MED ORDER — MORPHINE SULFATE 4 MG/ML IJ SOLN
4.0000 mg | Freq: Once | INTRAMUSCULAR | Status: AC
  Administered 2014-10-07: 4 mg via INTRAVENOUS
  Filled 2014-10-07: qty 1

## 2014-10-07 MED ORDER — ALBUTEROL SULFATE HFA 108 (90 BASE) MCG/ACT IN AERS
2.0000 | INHALATION_SPRAY | Freq: Once | RESPIRATORY_TRACT | Status: AC
Start: 2014-10-07 — End: 2014-10-07
  Administered 2014-10-07: 2 via RESPIRATORY_TRACT
  Filled 2014-10-07: qty 6.7

## 2014-10-07 MED ORDER — SODIUM CHLORIDE 0.9 % IV SOLN: INTRAVENOUS | Status: DC

## 2014-10-07 MED ORDER — PREDNISONE 20 MG PO TABS: ORAL_TABLET | ORAL | Status: DC

## 2014-10-07 MED ORDER — IPRATROPIUM-ALBUTEROL 0.5-2.5 (3) MG/3ML IN SOLN
RESPIRATORY_TRACT | Status: AC
  Filled 2014-10-07: qty 3

## 2014-10-07 MED ORDER — METHYLPREDNISOLONE SODIUM SUCC 125 MG IJ SOLR
125.0000 mg | Freq: Once | INTRAMUSCULAR | Status: AC
  Administered 2014-10-07: 125 mg via INTRAVENOUS
  Filled 2014-10-07: qty 2

## 2014-10-07 MED ORDER — IPRATROPIUM-ALBUTEROL 0.5-2.5 (3) MG/3ML IN SOLN
3.0000 mL | Freq: Once | RESPIRATORY_TRACT | Status: AC
  Administered 2014-10-07: 3 mL via RESPIRATORY_TRACT
  Filled 2014-10-07: qty 3

## 2014-10-07 MED ORDER — LEVOFLOXACIN 750 MG PO TABS: 750.0000 mg | ORAL_TABLET | Freq: Every day | ORAL | Status: DC

## 2014-10-07 MED ORDER — LEVOFLOXACIN IN D5W 750 MG/150ML IV SOLN
750.0000 mg | Freq: Once | INTRAVENOUS | Status: AC
  Administered 2014-10-07: 750 mg via INTRAVENOUS
  Filled 2014-10-07: qty 150

## 2014-10-07 MED ORDER — IOHEXOL 350 MG/ML SOLN
80.0000 mL | Freq: Once | INTRAVENOUS | Status: AC | PRN
Start: 2014-10-07 — End: 2014-10-07
  Administered 2014-10-07: 80 mL via INTRAVENOUS

## 2014-10-07 MED ORDER — SODIUM CHLORIDE 0.9 % IV SOLN
INTRAVENOUS | Status: DC
  Administered 2014-10-07: 09:00:00 via INTRAVENOUS

## 2014-10-07 MED ORDER — MAGNESIUM SULFATE 2 GM/50ML IV SOLN
2.0000 g | Freq: Once | INTRAVENOUS | Status: AC
  Administered 2014-10-07: 2 g via INTRAVENOUS
  Filled 2014-10-07: qty 50

## 2014-10-07 NOTE — ED Notes (Signed)
Patient transported to CT 

## 2014-10-07 NOTE — Discharge Instructions (Signed)
Please follow with your primary care doctor in the next 2 days for a check-up. They must obtain records for further management.  ° °Do not hesitate to return to the Emergency Department for any new, worsening or concerning symptoms.  ° °

## 2014-10-07 NOTE — ED Notes (Signed)
Pt reports SOB/ asthma flare since Sunday pm.  Pt reports using rescue inhaler at home until meds ran out.  Breathing labored.

## 2014-10-07 NOTE — ED Provider Notes (Signed)
CSN: [id number]     Arrival date & time 10/07/14  1856 History   None    Chief Complaint  Patient presents with  . Shortness of Breath  . Abdominal Pain     (Consider location/radiation/quality/duration/timing/severity/associated sxs/prior Treatment) HPI   Blood pressure 127/108, pulse 104, temperature 97.5 F (36.4 C), temperature source Oral, resp. rate 22, SpO2 100 %.  [name] is a 54 y.o. male with past medical history significant for DVT/PE (anticoagulated with eliquis, ?vena cana filter), asthma/COPD  complaining of shortness of breath and wheezing onset 3 days ago after patient ran out of his inhaler from using it too frequently. He reports subjective fever, chills, dry cough, a retrosternal, sharp, constant CP with pleuritic exacerbations onset yesterday that rated at 8/10. Patient states he's been compliant with his eliquis. States this does not feel like prior heart attack. She denies abdominal pain (note that this contradicts triage note) of confirm this with him several times that he is not having any abdominal pain, nausea, vomiting.  Past Medical History  Diagnosis Date  . Asthma   . DVT (deep venous thrombosis)     "I had ~ 10 in each leg"  . Pulmonary embolism X 1  . CAD (coronary artery disease) 10/21/13    BMS to D1  . COPD (chronic obstructive pulmonary disease)   . Myocardial infarction 09/2013  . Pneumonia X 2  . Sinus headache   . Arthritis     "hands and toes" (01/26/2014)  . CHF (congestive heart failure)   . Hypertension    Past Surgical History  Procedure Laterality Date  . Vena cava filter placement  " ~ 2012  . Coronary angioplasty with stent placement  10/21/13    BMS to D1  . Left heart catheterization with coronary angiogram N/A 10/21/2013    Procedure: LEFT HEART CATHETERIZATION WITH CORONARY ANGIOGRAM;  Surgeon: Leonie Man, MD;  Location: [facility] CATH LAB;  Service: Cardiovascular;  Laterality: N/A;   Family History  Problem Relation Age  of Onset  . Asthma Mother   . Allergies Mother   . Allergies Sister   . Allergies Brother   . Deep vein thrombosis Brother     two brothers with recurrent DVT   History  Substance Use Topics  . Smoking status: Current Every Day Smoker -- 0.25 packs/day for 38 years    Types: Cigarettes  . Smokeless tobacco: Never Used  . Alcohol Use: 3.6 oz/week    6 Cans of beer per week     Comment: 01/26/2014 "drink  2 40oz beers weekly"    Review of Systems  10 systems reviewed and found to be negative, except as noted in the HPI.   Allergies  Review of patient's allergies indicates no known allergies.  Home Medications   Prior to Admission medications   Medication Sig Start Date End Date Taking? Authorizing Provider  albuterol (PROVENTIL HFA;VENTOLIN HFA) 108 (90 BASE) MCG/ACT inhaler Inhale 2 puffs into the lungs every 6 (six) hours as needed for wheezing or shortness of breath. 05/01/14   Florencia Reasons, MD  apixaban (ELIQUIS) 2.5 MG TABS tablet Take 1 tablet (2.5 mg total) by mouth 2 (two) times daily. Patient taking differently: Take 2.5 mg by mouth daily.  10/27/13   Thurnell Lose, MD  atorvastatin (LIPITOR) 80 MG tablet Take 1 tablet (80 mg total) by mouth daily at 6 PM. Patient not taking: Reported on 04/27/2014 10/27/13   Thurnell Lose, MD  fluticasone (  FLONASE) 50 MCG/ACT nasal spray Place 2 sprays into both nostrils daily. Patient not taking: Reported on 10/07/2014 06/18/14   Bonnielee Haff, MD  Fluticasone-Salmeterol (ADVAIR DISKUS) 250-50 MCG/DOSE AEPB Inhale 1 puff into the lungs 2 (two) times daily. 06/18/14   Bonnielee Haff, MD  metoprolol tartrate (LOPRESSOR) 25 MG tablet Take 0.5 tablets (12.5 mg total) by mouth 2 (two) times daily. 05/01/14   Florencia Reasons, MD  montelukast (SINGULAIR) 10 MG tablet Take 1 tablet (10 mg total) by mouth at bedtime. 06/18/14   Bonnielee Haff, MD  nitroGLYCERIN (NITROSTAT) 0.4 MG SL tablet Place 1 tablet (0.4 mg total) under the tongue every 5 (five) minutes as  needed for chest pain (CP or SOB). 10/27/13   Thurnell Lose, MD  predniSONE (DELTASONE) 20 MG tablet Take 3 tablets once daily for 3 days, then take 2 tablets once daily for 3 days, then take 1 tablet once daily for 3 days then stop. 06/18/14   Bonnielee Haff, MD  tiotropium (SPIRIVA) 18 MCG inhalation capsule Place 1 capsule (18 mcg total) into inhaler and inhale daily. 05/01/14   Florencia Reasons, MD   BP 147/96 mmHg  Pulse 63  Temp(Src) 97.5 F (36.4 C) (Oral)  Resp 16  SpO2 96% Physical Exam  Constitutional: He is oriented to person, place, and time. He appears well-developed and well-nourished. No distress.  HENT:  Head: Normocephalic and atraumatic.  Mouth/Throat: Oropharynx is clear and moist.  Eyes: Conjunctivae and EOM are normal. Pupils are equal, round, and reactive to light.  Cardiovascular: Normal rate, regular rhythm and intact distal pulses.   Pulmonary/Chest: Effort normal. No stridor. No respiratory distress. He has wheezes. He has no rales. He exhibits no tenderness.  Patient is reclining, speaking in complete sentences, no stridor.  Lung sounds with reduced air movement at the bases bilaterally, moderate expiratory wheezing scattered bilateral.   Abdominal: Soft. Bowel sounds are normal. He exhibits no distension and no mass. There is no tenderness. There is no rebound and no guarding.  Musculoskeletal: Normal range of motion.  Neurological: He is alert and oriented to person, place, and time.  Psychiatric: He has a normal mood and affect.  Nursing note and vitals reviewed.   ED Course  Procedures (including critical care time) Labs Review Labs Reviewed  CBC WITH DIFFERENTIAL/PLATELET - Abnormal; Notable for the following:    WBC 3.1 (*)    Platelets 128 (*)    Neutrophils Relative % 33 (*)    Neutro Abs 1.0 (*)    Eosinophils Relative 12 (*)    All other components within normal limits  BASIC METABOLIC PANEL - Abnormal; Notable for the following:    CO2 21 (*)     Glucose, Bld 100 (*)    All other components within normal limits  D-DIMER, QUANTITATIVE (NOT AT [facility]) - Abnormal; Notable for the following:    D-Dimer, Quant 1.18 (*)    All other components within normal limits  TROPONIN I    Imaging Review Dg Chest 2 View  10/07/2014   CLINICAL DATA:  Asthma.  Cough.  Shortness of breath.  EXAM: CHEST  2 VIEW  COMPARISON:  06/15/2014  FINDINGS: Thoracic spondylosis. Airway thickening is present, suggesting bronchitis or reactive airways disease.  The lungs appear otherwise clear. Cardiac and mediastinal margins appear normal. No pleural effusion.  IMPRESSION: 1. Airway thickening is present, suggesting bronchitis or reactive airways disease. 2. Thoracic spondylosis.   Electronically Signed   By: Cindra Eves.D.  On: 10/07/2014 07:28   Ct Angio Chest Pe W/cm &/or Wo Cm  10/07/2014   CLINICAL DATA:  Shortness of breath for 4 days  EXAM: CT ANGIOGRAPHY CHEST WITH CONTRAST  TECHNIQUE: Multidetector CT imaging of the chest was performed using the standard protocol during bolus administration of intravenous contrast. Multiplanar CT image reconstructions and MIPs were obtained to evaluate the vascular anatomy.  CONTRAST:  96mL OMNIPAQUE IOHEXOL 350 MG/ML SOLN  COMPARISON:  Chest CT June 15, 2014; chest radiograph October 07, 2014  FINDINGS: There is no demonstrable pulmonary embolus. There is no thoracic aortic aneurysm or dissection. There is modest atherosclerotic change in the visualized great vessels.  There is no lung edema or consolidation. There is chronic bronchial wall thickening with mild upper and lower lobe bronchiectatic change.  Thyroid appears normal. There are multiple small mediastinal lymph nodes. There is no adenopathy by size criteria.  There is extensive coronary artery calcification. The pericardium is not thickened. There is left ventricular hypertrophy.  No focal lesion is identified in the visualized upper abdomen.  There are no blastic or  lytic bone lesions. There is degenerative change in the mid to lower thoracic region.  Review of the MIP images confirms the above findings.  IMPRESSION: No demonstrable pulmonary embolus. Evidence of chronic bronchitis and mild bronchiectatic change bilaterally, stable. No lung edema or consolidation. No appreciable adenopathy. There is extensive coronary artery calcification. There is left ventricular hypertrophy.   Electronically Signed   By: Lowella Grip III M.D.   On: 10/07/2014 10:22     EKG Interpretation   Date/Time:  Wednesday October 07 2014 06:27:11 EDT Ventricular Rate:  107 PR Interval:  178 QRS Duration: 81 QT Interval:  362 QTC Calculation: 483 R Axis:   -6 Text Interpretation:  Sinus tachycardia LAE, consider biatrial enlargement  Probable anteroseptal infarct, old Confirmed by OTTER  MD, OLGA ([id number]) on  10/07/2014 6:32:18 AM      MDM   Final diagnoses:  SOB (shortness of breath)  COPD with acute exacerbation    Filed Vitals:   10/07/14 0930 10/07/14 1020 10/07/14 1042 10/07/14 1045  BP: 147/83 149/93  147/96  Pulse: 71 76  63  Temp:      TempSrc:      Resp: 13 15  16   SpO2: 99% 97% 97% 96%    Medications  0.9 %  sodium chloride infusion (not administered)  0.9 %  sodium chloride infusion ( Intravenous New Bag/Given 10/07/14 0902)  ipratropium-albuterol (DUONEB) 0.5-2.5 (3) MG/3ML nebulizer solution (  Not Given 10/07/14 0905)  levofloxacin (LEVAQUIN) IVPB 750 mg (750 mg Intravenous New Bag/Given 10/07/14 1057)  ipratropium-albuterol (DUONEB) 0.5-2.5 (3) MG/3ML nebulizer solution 3 mL (3 mLs Nebulization Given 10/07/14 0701)  methylPREDNISolone sodium succinate (SOLU-MEDROL) 125 mg/2 mL injection 125 mg (125 mg Intravenous Given 10/07/14 0745)  albuterol (PROVENTIL HFA;VENTOLIN HFA) 108 (90 BASE) MCG/ACT inhaler 2 puff (2 puffs Inhalation Given 10/07/14 0745)  morphine 4 MG/ML injection 4 mg (4 mg Intravenous Given 10/07/14 0858)  ipratropium-albuterol (DUONEB)  0.5-2.5 (3) MG/3ML nebulizer solution 3 mL (3 mLs Nebulization Given 10/07/14 0900)  magnesium sulfate IVPB 2 g 50 mL (2 g Intravenous New Bag/Given 10/07/14 0903)  iohexol (OMNIPAQUE) 350 MG/ML injection 80 mL (80 mLs Intravenous Contrast Given 10/07/14 0943)  ipratropium-albuterol (DUONEB) 0.5-2.5 (3) MG/3ML nebulizer solution 3 mL (3 mLs Nebulization Given 10/07/14 1041)    [name] is a pleasant 54 y.o. male presenting with shortness of breath and wheezing and  retrosternal chest pain. Shortness of breath started 3 days after patient ran out of his albuterol which he was are using frequently secondary to asthma flare, the chest pain started yesterday, does not feel like his prior heart attack. EKG with nonspecific T-wave changes, unchanged from prior. Troponin is negative, blood work without acute abnormality except for elevated d-dimer. Patient does have history of DVT and PE. He states he's been compliant with his alcohol is felt based on the new chest pain will obtain CT to rule out new embolism.  8:40 AM: Patient reassessed after first nebulizer, he is moving better air and wheezing has reduced somewhat but is still present bilaterally, would like another treatment so DuoNeb is ordered. Of advised him that we are going to scan his chest. Patient will be given morphine for pain. 2 g of magnesium for asthma exacerbation.   CT with no PE. We'll treat for asthma. PD exacerbation with Levaquin, prednisone burst. Patient will be given albuterol inhaler in the ED. He states he will follow with his PCP on Monday.   Evaluation does not show pathology that would require ongoing emergent intervention or inpatient treatment. Pt is hemodynamically stable and mentating appropriately. Discussed findings and plan with patient/guardian, who agrees with care plan. All questions answered. Return precautions discussed and outpatient follow up given.    Monico Blitz, PA-C 10/07/14 [place],  MD 10/09/14 [phone]

## 2014-10-20 ENCOUNTER — Encounter (HOSPITAL_COMMUNITY): Payer: Self-pay | Admitting: Emergency Medicine

## 2014-10-20 ENCOUNTER — Inpatient Hospital Stay (HOSPITAL_COMMUNITY)
Admission: EM | Admit: 2014-10-20 | Discharge: 2014-10-22 | DRG: 192 | Disposition: A | Discharge status: Home / Self Care | Payer: Medicare Other | Attending: Internal Medicine | Admitting: Internal Medicine

## 2014-10-20 ENCOUNTER — Emergency Department (HOSPITAL_COMMUNITY): Payer: Medicare Other

## 2014-10-20 DIAGNOSIS — Z8619 Personal history of other infectious and parasitic diseases: Secondary | ICD-10-CM

## 2014-10-20 DIAGNOSIS — I509 Heart failure, unspecified: Secondary | ICD-10-CM | POA: Diagnosis present

## 2014-10-20 DIAGNOSIS — Z86711 Personal history of pulmonary embolism: Secondary | ICD-10-CM

## 2014-10-20 DIAGNOSIS — J45909 Unspecified asthma, uncomplicated: Secondary | ICD-10-CM | POA: Diagnosis present

## 2014-10-20 DIAGNOSIS — Z7901 Long term (current) use of anticoagulants: Secondary | ICD-10-CM

## 2014-10-20 DIAGNOSIS — I2510 Atherosclerotic heart disease of native coronary artery without angina pectoris: Secondary | ICD-10-CM | POA: Diagnosis not present

## 2014-10-20 DIAGNOSIS — I82409 Acute embolism and thrombosis of unspecified deep veins of unspecified lower extremity: Secondary | ICD-10-CM | POA: Diagnosis present

## 2014-10-20 DIAGNOSIS — D696 Thrombocytopenia, unspecified: Secondary | ICD-10-CM | POA: Diagnosis present

## 2014-10-20 DIAGNOSIS — F17210 Nicotine dependence, cigarettes, uncomplicated: Secondary | ICD-10-CM | POA: Diagnosis not present

## 2014-10-20 DIAGNOSIS — R079 Chest pain, unspecified: Secondary | ICD-10-CM | POA: Diagnosis not present

## 2014-10-20 DIAGNOSIS — Z79899 Other long term (current) drug therapy: Secondary | ICD-10-CM

## 2014-10-20 DIAGNOSIS — J441 Chronic obstructive pulmonary disease with (acute) exacerbation: Principal | ICD-10-CM | POA: Diagnosis present

## 2014-10-20 DIAGNOSIS — Z955 Presence of coronary angioplasty implant and graft: Secondary | ICD-10-CM

## 2014-10-20 DIAGNOSIS — M1990 Unspecified osteoarthritis, unspecified site: Secondary | ICD-10-CM | POA: Diagnosis present

## 2014-10-20 DIAGNOSIS — I10 Essential (primary) hypertension: Secondary | ICD-10-CM | POA: Diagnosis not present

## 2014-10-20 DIAGNOSIS — J9601 Acute respiratory failure with hypoxia: Secondary | ICD-10-CM | POA: Diagnosis present

## 2014-10-20 DIAGNOSIS — Z825 Family history of asthma and other chronic lower respiratory diseases: Secondary | ICD-10-CM

## 2014-10-20 DIAGNOSIS — J45901 Unspecified asthma with (acute) exacerbation: Secondary | ICD-10-CM

## 2014-10-20 DIAGNOSIS — R069 Unspecified abnormalities of breathing: Secondary | ICD-10-CM | POA: Diagnosis not present

## 2014-10-20 DIAGNOSIS — Z720 Tobacco use: Secondary | ICD-10-CM | POA: Diagnosis present

## 2014-10-20 DIAGNOSIS — I252 Old myocardial infarction: Secondary | ICD-10-CM

## 2014-10-20 MED ORDER — METHYLPREDNISOLONE SODIUM SUCC 125 MG IJ SOLR
125.0000 mg | Freq: Once | INTRAMUSCULAR | Status: AC
  Administered 2014-10-20: 125 mg via INTRAVENOUS
  Filled 2014-10-20: qty 2

## 2014-10-20 MED ORDER — SODIUM CHLORIDE 0.9 % IV BOLUS (SEPSIS): 500.0000 mL | Freq: Once | INTRAVENOUS | Status: DC

## 2014-10-20 MED ORDER — ALBUTEROL (5 MG/ML) CONTINUOUS INHALATION SOLN
10.0000 mg/h | INHALATION_SOLUTION | Freq: Once | RESPIRATORY_TRACT | Status: AC
  Administered 2014-10-21: 10 mg/h via RESPIRATORY_TRACT
  Filled 2014-10-20: qty 20

## 2014-10-20 NOTE — ED Notes (Addendum)
Pt reports shortness of breath and wheezing that started this morning. Pt also c/o of substernal chest pain since this am. Cough is present pt states," cant cough enough up." Pt was seen at home by ems earlier today refused transport to hospital. EMS gave 10mg  albuterol and .5 atrovent in route to ED.

## 2014-10-20 NOTE — ED Provider Notes (Signed)
CSN: [id number]     Arrival date & time 10/20/14  2217 History   First MD Initiated Contact with Patient 10/20/14 2218     Chief Complaint  Patient presents with  . Shortness of Breath    HPI  54 year old male presents today with shortness of breath. Patient has a history of asthma COPD reports that on Sunday he had a family reunion drink excessively, and may have been smoking illicit drugs, uncertain as he was severely intoxicated. He reports that over the next day his asthma COPD symptoms have continued to worsen, using at home meter dosed inhaler that has not improved symptoms. Patient called the ambulance today who came out administered a albuterol treatment, patient refused hospital transfer that time. Patients symptoms returned requiring EMS transport. EMS reports giving 10 mg of albuterol and 0.5 mg of Atrovent. The report diffuse wheezing, and respiratory distress at the time of initial evaluation. They report his oxygen saturation was in the low 90s on room air without ambulation; tripod position. Pt reports chest tightness and a "squeezing"; typical of his asthma attacks.   Past Medical History  Diagnosis Date  . Asthma   . DVT (deep venous thrombosis)     "I had ~ 10 in each leg"  . Pulmonary embolism X 1  . CAD (coronary artery disease) 10/21/13    BMS to D1  . COPD (chronic obstructive pulmonary disease)   . Myocardial infarction 09/2013  . Pneumonia X 2  . Sinus headache   . Arthritis     "hands and toes" (01/26/2014)  . CHF (congestive heart failure)   . Hypertension    Past Surgical History  Procedure Laterality Date  . Vena cava filter placement  " ~ 2012  . Coronary angioplasty with stent placement  10/21/13    BMS to D1  . Left heart catheterization with coronary angiogram N/A 10/21/2013    Procedure: LEFT HEART CATHETERIZATION WITH CORONARY ANGIOGRAM;  Surgeon: Leonie Man, MD;  Location: [facility] C[facility] LAB;  Service: Cardiovascular;  Laterality: N/A;   Family History   Problem Relation Age of Onset  . Asthma Mother   . Allergies Mother   . Allergies Sister   . Allergies Brother   . Deep vein thrombosis Brother     two brothers with recurrent DVT   History  Substance Use Topics  . Smoking status: Current Every Day Smoker -- 0.25 packs/day for 38 years    Types: Cigarettes  . Smokeless tobacco: Never Used  . Alcohol Use: 3.6 oz/week    6 Cans of beer per week     Comment: 01/26/2014 "drink  2 40oz beers weekly"    Review of Systems  All other systems reviewed and are negative.   Allergies  Review of patient's allergies indicates no known allergies.  Home Medications   Prior to Admission medications   Medication Sig Start Date End Date Taking? Authorizing Provider  albuterol (PROVENTIL HFA;VENTOLIN HFA) 108 (90 BASE) MCG/ACT inhaler Inhale 2 puffs into the lungs every 6 (six) hours as needed for wheezing or shortness of breath. 05/01/14  Yes Florencia Reasons, MD  apixaban (ELIQUIS) 2.5 MG TABS tablet Take 1 tablet (2.5 mg total) by mouth 2 (two) times daily. Patient taking differently: Take 2.5 mg by mouth daily.  10/27/13  Yes Thurnell Lose, MD  Fluticasone-Salmeterol (ADVAIR DISKUS) 250-50 MCG/DOSE AEPB Inhale 1 puff into the lungs 2 (two) times daily. 06/18/14  Yes Bonnielee Haff, MD  metoprolol tartrate (LOPRESSOR) 25  MG tablet Take 0.5 tablets (12.5 mg total) by mouth 2 (two) times daily. 05/01/14  Yes Florencia Reasons, MD  montelukast (SINGULAIR) 10 MG tablet Take 1 tablet (10 mg total) by mouth at bedtime. 06/18/14  Yes Bonnielee Haff, MD  nitroGLYCERIN (NITROSTAT) 0.4 MG SL tablet Place 1 tablet (0.4 mg total) under the tongue every 5 (five) minutes as needed for chest pain (CP or SOB). 10/27/13  Yes Thurnell Lose, MD  tiotropium (SPIRIVA) 18 MCG inhalation capsule Place 1 capsule (18 mcg total) into inhaler and inhale daily. 05/01/14  Yes Florencia Reasons, MD  atorvastatin (LIPITOR) 80 MG tablet Take 1 tablet (80 mg total) by mouth daily at 6 PM. Patient not taking:  Reported on 04/27/2014 10/27/13   Thurnell Lose, MD  fluticasone (FLONASE) 50 MCG/ACT nasal spray Place 2 sprays into both nostrils daily. Patient not taking: Reported on 10/07/2014 06/18/14   Bonnielee Haff, MD  levofloxacin (LEVAQUIN) 750 MG tablet Take 1 tablet (750 mg total) by mouth daily. X 7 days Patient not taking: Reported on 10/20/2014 10/07/14   Elmyra Ricks Pisciotta, PA-C  predniSONE (DELTASONE) 20 MG tablet 3 tabs po day one, then 2 po daily x 4 days Patient not taking: Reported on 10/20/2014 10/07/14   Elmyra Ricks Pisciotta, PA-C   BP 125/94 mmHg  Pulse 85  Temp(Src) 98.8 F (37.1 C) (Oral)  Resp 29  Ht 6\' 2"  (1.88 m)  Wt 175 lb (79.379 kg)  BMI 22.46 kg/m2  SpO2 97%   Physical Exam  Constitutional: He is oriented to person, place, and time. He appears well-developed and well-nourished.  HENT:  Head: Normocephalic and atraumatic.  Eyes: Conjunctivae are normal. Pupils are equal, round, and reactive to light. Right eye exhibits no discharge. Left eye exhibits no discharge. No scleral icterus.  Neck: Normal range of motion. No JVD present. No tracheal deviation present.  Cardiovascular: Normal rate, regular rhythm, normal heart sounds and intact distal pulses.   Pulmonary/Chest: No stridor. He is in respiratory distress. He has wheezes. He has no rales. He exhibits no tenderness.  Diffuse wheeze throughout, no rales or rhonchi noted  Abdominal: Soft. He exhibits no distension and no mass. There is no tenderness. There is no rebound and no guarding.  Musculoskeletal: Normal range of motion.  No lower extremity swelling or edema  Neurological: He is alert and oriented to person, place, and time. Coordination normal.  Psychiatric: He has a normal mood and affect. His behavior is normal. Judgment and thought content normal.  Nursing note and vitals reviewed.     ED Course  Procedures (including critical care time) Labs Review Labs Reviewed  CBC WITH DIFFERENTIAL/PLATELET - Abnormal;  Notable for the following:    RBC 4.07 (*)    HCT 38.4 (*)    Platelets 148 (*)    Neutrophils Relative % 40 (*)    Eosinophils Relative 10 (*)    All other components within normal limits  BASIC METABOLIC PANEL - Abnormal; Notable for the following:    CO2 21 (*)    BUN <5 (*)    All other components within normal limits  I-STAT TROPOININ, ED    Imaging Review Dg Chest 2 View  10/20/2014   CLINICAL DATA:  Midchest pain.  Dyspnea.  EXAM: CHEST  2 VIEW  COMPARISON:  10/07/2014  FINDINGS: There is mild interstitial coarsening which appears chronic. No superimposed acute infiltrate is evident. There is no evidence of congestive heart failure. There is no effusion. Hilar, mediastinal  and cardiac contours are unremarkable and unchanged. Heart size is normal.  IMPRESSION: Chronic appearing interstitial coarsening.  No acute findings.   Electronically Signed   By: Andreas Newport M.D.   On: 10/20/2014 23:56     EKG Interpretation None      MDM   Final diagnoses:  COPD exacerbation    Labs: CBC, BMP, troponin-   Imaging: DG chest 2 view- chronic-appearing interstitial coarsening no acute findings  Consults: Hospital Service   Therapeutics:  Albuterol, Solu medrol  Discharge Meds:   Assessment/Plan: Pt presents via ems with COPD/ Asthma exacerbation. Pt was treated by EMS earlier in the day with a return of symptoms. Pt again treated by EMS with 10 mg of albuterol, treated here in the ED with albuterol and solumedrol with only minor improvement. Pt still wheezing, oxygen saturations reassuring but with continued wheeze despite multiple therapeutic attempts pt would benefit for continued management in the hospital setting.  Hospital service consulted who agreed to accept.          Okey Regal, PA-C 10/21/14 0160  Dorie Rank, MD 10/21/14 [id number]

## 2014-10-21 ENCOUNTER — Encounter (HOSPITAL_COMMUNITY): Payer: Self-pay | Admitting: Internal Medicine

## 2014-10-21 DIAGNOSIS — Z825 Family history of asthma and other chronic lower respiratory diseases: Secondary | ICD-10-CM | POA: Diagnosis not present

## 2014-10-21 DIAGNOSIS — Z86711 Personal history of pulmonary embolism: Secondary | ICD-10-CM | POA: Diagnosis not present

## 2014-10-21 DIAGNOSIS — Z8619 Personal history of other infectious and parasitic diseases: Secondary | ICD-10-CM | POA: Diagnosis not present

## 2014-10-21 DIAGNOSIS — R0789 Other chest pain: Secondary | ICD-10-CM

## 2014-10-21 DIAGNOSIS — Z720 Tobacco use: Secondary | ICD-10-CM | POA: Diagnosis not present

## 2014-10-21 DIAGNOSIS — Z79899 Other long term (current) drug therapy: Secondary | ICD-10-CM | POA: Diagnosis not present

## 2014-10-21 DIAGNOSIS — I10 Essential (primary) hypertension: Secondary | ICD-10-CM

## 2014-10-21 DIAGNOSIS — D696 Thrombocytopenia, unspecified: Secondary | ICD-10-CM | POA: Diagnosis present

## 2014-10-21 DIAGNOSIS — I252 Old myocardial infarction: Secondary | ICD-10-CM | POA: Diagnosis not present

## 2014-10-21 DIAGNOSIS — J441 Chronic obstructive pulmonary disease with (acute) exacerbation: Secondary | ICD-10-CM | POA: Diagnosis not present

## 2014-10-21 DIAGNOSIS — I509 Heart failure, unspecified: Secondary | ICD-10-CM | POA: Diagnosis present

## 2014-10-21 DIAGNOSIS — J9601 Acute respiratory failure with hypoxia: Secondary | ICD-10-CM

## 2014-10-21 DIAGNOSIS — J45909 Unspecified asthma, uncomplicated: Secondary | ICD-10-CM | POA: Diagnosis present

## 2014-10-21 DIAGNOSIS — I82409 Acute embolism and thrombosis of unspecified deep veins of unspecified lower extremity: Secondary | ICD-10-CM | POA: Diagnosis not present

## 2014-10-21 DIAGNOSIS — Z955 Presence of coronary angioplasty implant and graft: Secondary | ICD-10-CM | POA: Diagnosis not present

## 2014-10-21 DIAGNOSIS — F17210 Nicotine dependence, cigarettes, uncomplicated: Secondary | ICD-10-CM | POA: Diagnosis present

## 2014-10-21 DIAGNOSIS — R079 Chest pain, unspecified: Secondary | ICD-10-CM

## 2014-10-21 DIAGNOSIS — I2510 Atherosclerotic heart disease of native coronary artery without angina pectoris: Secondary | ICD-10-CM | POA: Diagnosis present

## 2014-10-21 DIAGNOSIS — M1990 Unspecified osteoarthritis, unspecified site: Secondary | ICD-10-CM | POA: Diagnosis present

## 2014-10-21 DIAGNOSIS — Z7901 Long term (current) use of anticoagulants: Secondary | ICD-10-CM | POA: Diagnosis not present

## 2014-10-21 LAB — CBC WITH DIFFERENTIAL/PLATELET
Basophils Absolute: 0 10*3/uL (ref 0.0–0.1)
Basophils Absolute: 0 10*3/uL (ref 0.0–0.1)
Basophils Relative: 0 % (ref 0–1)
Basophils Relative: 0 % (ref 0–1)
Eosinophils Absolute: 0.5 10*3/uL (ref 0.0–0.7)
Eosinophils Absolute: 0 10*3/uL (ref 0.0–0.7)
Eosinophils Relative: 10 % — ABNORMAL HIGH (ref 0–5)
Eosinophils Relative: 1 % (ref 0–5)
HCT: 38.4 % — ABNORMAL LOW (ref 39.0–52.0)
HEMATOCRIT: 38.1 % — AB (ref 39.0–52.0)
Hemoglobin: 13.1 g/dL (ref 13.0–17.0)
Hemoglobin: 12.9 g/dL — ABNORMAL LOW (ref 13.0–17.0)
LYMPHS ABS: 0.4 10*3/uL — AB (ref 0.7–4.0)
LYMPHS PCT: 11 % — AB (ref 12–46)
Lymphocytes Relative: 40 % (ref 12–46)
Lymphs Abs: 2 10*3/uL (ref 0.7–4.0)
MCH: 32.2 pg (ref 26.0–34.0)
MCH: 32 pg (ref 26.0–34.0)
MCHC: 34.1 g/dL (ref 30.0–36.0)
MCHC: 33.9 g/dL (ref 30.0–36.0)
MCV: 94.3 fL (ref 78.0–100.0)
MCV: 94.5 fL (ref 78.0–100.0)
Monocytes Absolute: 0.5 10*3/uL (ref 0.1–1.0)
Monocytes Absolute: 0.1 10*3/uL (ref 0.1–1.0)
Monocytes Relative: 10 % (ref 3–12)
Monocytes Relative: 2 % — ABNORMAL LOW (ref 3–12)
NEUTROS ABS: 3 10*3/uL (ref 1.7–7.7)
NEUTROS PCT: 86 % — AB (ref 43–77)
Neutro Abs: 1.9 10*3/uL (ref 1.7–7.7)
Neutrophils Relative %: 40 % — ABNORMAL LOW (ref 43–77)
Platelets: 148 10*3/uL — ABNORMAL LOW (ref 150–400)
Platelets: 146 10*3/uL — ABNORMAL LOW (ref 150–400)
RBC: 4.07 MIL/uL — ABNORMAL LOW (ref 4.22–5.81)
RBC: 4.03 MIL/uL — ABNORMAL LOW (ref 4.22–5.81)
RDW: 13.3 % (ref 11.5–15.5)
RDW: 13.2 % (ref 11.5–15.5)
WBC: 4.8 10*3/uL (ref 4.0–10.5)
WBC: 3.5 10*3/uL — ABNORMAL LOW (ref 4.0–10.5)

## 2014-10-21 LAB — TROPONIN I
Troponin I: <0.03 ng/mL (ref <0.031)
Troponin I: <0.03 ng/mL (ref <0.031)
Troponin I: <0.03 ng/mL (ref <0.031)

## 2014-10-21 LAB — COMPREHENSIVE METABOLIC PANEL
ALBUMIN: 3.1 g/dL — AB (ref 3.5–5.0)
ALK PHOS: 79 U/L (ref 38–126)
ALT: 29 U/L (ref 17–63)
ANION GAP: 11 (ref 5–15)
AST: 42 U/L — ABNORMAL HIGH (ref 15–41)
BUN: 6 mg/dL (ref 6–20)
CO2: 23 mmol/L (ref 22–32)
Calcium: 9 mg/dL (ref 8.9–10.3)
Chloride: 105 mmol/L (ref 101–111)
Creatinine, Ser: 0.9 mg/dL (ref 0.61–1.24)
GFR calc Af Amer: >60 mL/min (ref >60)
GFR calc non Af Amer: >60 mL/min (ref >60)
GLUCOSE: 279 mg/dL — AB (ref 65–99)
Potassium: 3.7 mmol/L (ref 3.5–5.1)
Sodium: 139 mmol/L (ref 135–145)
Total Bilirubin: 0.6 mg/dL (ref 0.3–1.2)
Total Protein: 6.3 g/dL — ABNORMAL LOW (ref 6.5–8.1)

## 2014-10-21 LAB — BASIC METABOLIC PANEL
Anion gap: 9 (ref 5–15)
BUN: <5 mg/dL — ABNORMAL LOW (ref 6–20)
CO2: 21 mmol/L — ABNORMAL LOW (ref 22–32)
Calcium: 9.2 mg/dL (ref 8.9–10.3)
Chloride: 109 mmol/L (ref 101–111)
Creatinine, Ser: 0.71 mg/dL (ref 0.61–1.24)
GFR calc Af Amer: >60 mL/min (ref >60)
GFR calc non Af Amer: >60 mL/min (ref >60)
Glucose, Bld: 98 mg/dL (ref 65–99)
Potassium: 3.7 mmol/L (ref 3.5–5.1)
Sodium: 139 mmol/L (ref 135–145)

## 2014-10-21 LAB — I-STAT TROPONIN, ED: Troponin i, poc: 0 ng/mL (ref 0.00–0.08)

## 2014-10-21 LAB — RAPID URINE DRUG SCREEN, HOSP PERFORMED
AMPHETAMINES: NOT DETECTED
Barbiturates: NOT DETECTED
Benzodiazepines: NOT DETECTED
Cocaine: POSITIVE — AB
Opiates: NOT DETECTED
Tetrahydrocannabinol: NOT DETECTED

## 2014-10-21 MED ORDER — DOXYCYCLINE HYCLATE 100 MG IV SOLR
100.0000 mg | Freq: Two times a day (BID) | INTRAVENOUS | Status: DC
  Administered 2014-10-21 – 2014-10-22 (×3): 100 mg via INTRAVENOUS
  Filled 2014-10-21 (×4): qty 100

## 2014-10-21 MED ORDER — ALBUTEROL SULFATE (2.5 MG/3ML) 0.083% IN NEBU: 2.5000 mg | INHALATION_SOLUTION | RESPIRATORY_TRACT | Status: DC

## 2014-10-21 MED ORDER — IPRATROPIUM-ALBUTEROL 0.5-2.5 (3) MG/3ML IN SOLN: 3.0000 mL | Freq: Four times a day (QID) | RESPIRATORY_TRACT | Status: DC

## 2014-10-21 MED ORDER — ASPIRIN EC 325 MG PO TBEC
325.0000 mg | DELAYED_RELEASE_TABLET | Freq: Every day | ORAL | Status: DC
Start: 2014-10-21 — End: 2014-10-22
  Administered 2014-10-21 – 2014-10-22 (×2): 325 mg via ORAL
  Filled 2014-10-21 (×2): qty 1

## 2014-10-21 MED ORDER — ONDANSETRON HCL 4 MG/2ML IJ SOLN: 4.0000 mg | Freq: Four times a day (QID) | INTRAMUSCULAR | Status: DC | PRN

## 2014-10-21 MED ORDER — BUDESONIDE 0.25 MG/2ML IN SUSP
0.2500 mg | Freq: Two times a day (BID) | RESPIRATORY_TRACT | Status: DC
  Administered 2014-10-21 – 2014-10-22 (×3): 0.25 mg via RESPIRATORY_TRACT
  Filled 2014-10-21 (×3): qty 2

## 2014-10-21 MED ORDER — IPRATROPIUM BROMIDE 0.02 % IN SOLN: 0.5000 mg | RESPIRATORY_TRACT | Status: DC

## 2014-10-21 MED ORDER — IPRATROPIUM-ALBUTEROL 0.5-2.5 (3) MG/3ML IN SOLN
3.0000 mL | RESPIRATORY_TRACT | Status: DC
  Administered 2014-10-21 – 2014-10-22 (×8): 3 mL via RESPIRATORY_TRACT
  Filled 2014-10-21 (×7): qty 3

## 2014-10-21 MED ORDER — MONTELUKAST SODIUM 10 MG PO TABS
10.0000 mg | ORAL_TABLET | Freq: Every day | ORAL | Status: DC
  Administered 2014-10-21: 10 mg via ORAL
  Filled 2014-10-21: qty 1

## 2014-10-21 MED ORDER — MORPHINE SULFATE 2 MG/ML IJ SOLN: 1.0000 mg | INTRAMUSCULAR | Status: DC | PRN

## 2014-10-21 MED ORDER — NITROGLYCERIN 0.4 MG SL SUBL
0.4000 mg | SUBLINGUAL_TABLET | SUBLINGUAL | Status: DC | PRN
  Administered 2014-10-21 (×3): 0.4 mg via SUBLINGUAL
  Filled 2014-10-21 (×3): qty 1

## 2014-10-21 MED ORDER — METHYLPREDNISOLONE SODIUM SUCC 40 MG IJ SOLR
40.0000 mg | Freq: Two times a day (BID) | INTRAMUSCULAR | Status: DC
  Administered 2014-10-21 – 2014-10-22 (×3): 40 mg via INTRAVENOUS
  Filled 2014-10-21 (×3): qty 1

## 2014-10-21 MED ORDER — SODIUM CHLORIDE 0.9 % IJ SOLN
3.0000 mL | Freq: Two times a day (BID) | INTRAMUSCULAR | Status: DC
  Administered 2014-10-21 – 2014-10-22 (×3): 3 mL via INTRAVENOUS

## 2014-10-21 MED ORDER — NITROGLYCERIN 0.4 MG SL SUBL: 0.4000 mg | SUBLINGUAL_TABLET | SUBLINGUAL | Status: DC | PRN

## 2014-10-21 MED ORDER — ALBUTEROL SULFATE (2.5 MG/3ML) 0.083% IN NEBU: 2.5000 mg | INHALATION_SOLUTION | RESPIRATORY_TRACT | Status: DC | PRN

## 2014-10-21 MED ORDER — ACETAMINOPHEN 650 MG RE SUPP: 650.0000 mg | Freq: Four times a day (QID) | RECTAL | Status: DC | PRN

## 2014-10-21 MED ORDER — ENOXAPARIN SODIUM 40 MG/0.4ML ~~LOC~~ SOLN: 40.0000 mg | SUBCUTANEOUS | Status: DC

## 2014-10-21 MED ORDER — ACETAMINOPHEN 325 MG PO TABS: 650.0000 mg | ORAL_TABLET | Freq: Four times a day (QID) | ORAL | Status: DC | PRN

## 2014-10-21 MED ORDER — METOPROLOL TARTRATE 12.5 MG HALF TABLET
12.5000 mg | ORAL_TABLET | Freq: Two times a day (BID) | ORAL | Status: DC
  Administered 2014-10-21 – 2014-10-22 (×3): 12.5 mg via ORAL
  Filled 2014-10-21 (×3): qty 1

## 2014-10-21 MED ORDER — IPRATROPIUM-ALBUTEROL 0.5-2.5 (3) MG/3ML IN SOLN
3.0000 mL | RESPIRATORY_TRACT | Status: DC | PRN
  Filled 2014-10-21: qty 3

## 2014-10-21 MED ORDER — APIXABAN 2.5 MG PO TABS
2.5000 mg | ORAL_TABLET | Freq: Two times a day (BID) | ORAL | Status: DC
  Administered 2014-10-21 – 2014-10-22 (×3): 2.5 mg via ORAL
  Filled 2014-10-21 (×3): qty 1

## 2014-10-21 MED ORDER — ONDANSETRON HCL 4 MG PO TABS: 4.0000 mg | ORAL_TABLET | Freq: Four times a day (QID) | ORAL | Status: DC | PRN

## 2014-10-21 NOTE — Progress Notes (Addendum)
Assumed care from off going RN @0728 ; pt. Alert & oriented x 4; lying in bed with HOB 30 degrees; no c/o's at this time; pt. NPO until MD see pt.; bed in lowest position with call light in reach;  No acute changes; no c/o's pain; will give report to on coming RN

## 2014-10-21 NOTE — H&P (Addendum)
[facility] History and Physical  [name] RCV:[id number] DOB: July 04, 1960 DOA: 10/20/2014  Referring physician: Mr. Penni Bombard. PCP: [name], MD  Specialists: None.  Chief Complaint: Shortness of breath.  HPI: [name] is a 54 y.o. male with history of CAD status post stenting last year, COPD, hypertension and PE on Apixaban presents to the ER with complaints of shortness of breath and chest discomfort over the last 2 days. Denies any fever or chills has been having some productive cough. Patient also been having chest pain which was retrosternal pressure-like also increases on deep inspiration and cough. In the ER chest x-ray does not show anything acute EKG was unremarkable cardiac markers were negative and on exam patient is having bilateral expiratory wheeze. Patient has been placed on nebulizer and steroids antibiotics and admitted for COPD exacerbation and chest pain.   Review of Systems: As presented in the history of presenting illness, rest negative.  Past Medical History  Diagnosis Date  . Asthma   . DVT (deep venous thrombosis)     "I had ~ 10 in each leg"  . Pulmonary embolism X 1  . CAD (coronary artery disease) 10/21/13    BMS to D1  . COPD (chronic obstructive pulmonary disease)   . Myocardial infarction 09/2013  . Pneumonia X 2  . Sinus headache   . Arthritis     "hands and toes" (01/26/2014)  . CHF (congestive heart failure)   . Hypertension    Past Surgical History  Procedure Laterality Date  . Vena cava filter placement  " ~ 2012  . Coronary angioplasty with stent placement  10/21/13    BMS to D1  . Left heart catheterization with coronary angiogram N/A 10/21/2013    Procedure: LEFT HEART CATHETERIZATION WITH CORONARY ANGIOGRAM;  Surgeon: Leonie Man, MD;  Location: [facility] CATH LAB;  Service: Cardiovascular;  Laterality: N/A;   Social History:  reports that he has been smoking Cigarettes.  He has a 9.5 pack-year smoking history. He has  never used smokeless tobacco. He reports that he drinks about 3.6 oz of alcohol per week. He reports that he does not use illicit drugs. Where does patient live home. Can patient participate in ADLs? Yes.  No Known Allergies  Family History:  Family History  Problem Relation Age of Onset  . Asthma Mother   . Allergies Mother   . Allergies Sister   . Allergies Brother   . Deep vein thrombosis Brother     two brothers with recurrent DVT      Prior to Admission medications   Medication Sig Start Date End Date Taking? Authorizing Provider  albuterol (PROVENTIL HFA;VENTOLIN HFA) 108 (90 BASE) MCG/ACT inhaler Inhale 2 puffs into the lungs every 6 (six) hours as needed for wheezing or shortness of breath. 05/01/14  Yes Florencia Reasons, MD  apixaban (ELIQUIS) 2.5 MG TABS tablet Take 1 tablet (2.5 mg total) by mouth 2 (two) times daily. Patient taking differently: Take 2.5 mg by mouth daily.  10/27/13  Yes Thurnell Lose, MD  Fluticasone-Salmeterol (ADVAIR DISKUS) 250-50 MCG/DOSE AEPB Inhale 1 puff into the lungs 2 (two) times daily. 06/18/14  Yes Bonnielee Haff, MD  metoprolol tartrate (LOPRESSOR) 25 MG tablet Take 0.5 tablets (12.5 mg total) by mouth 2 (two) times daily. 05/01/14  Yes Florencia Reasons, MD  montelukast (SINGULAIR) 10 MG tablet Take 1 tablet (10 mg total) by mouth at bedtime. 06/18/14  Yes Bonnielee Haff, MD  nitroGLYCERIN (NITROSTAT) 0.4 MG SL tablet Place  1 tablet (0.4 mg total) under the tongue every 5 (five) minutes as needed for chest pain (CP or SOB). 10/27/13  Yes Thurnell Lose, MD  tiotropium (SPIRIVA) 18 MCG inhalation capsule Place 1 capsule (18 mcg total) into inhaler and inhale daily. 05/01/14  Yes Florencia Reasons, MD  atorvastatin (LIPITOR) 80 MG tablet Take 1 tablet (80 mg total) by mouth daily at 6 PM. Patient not taking: Reported on 04/27/2014 10/27/13   Thurnell Lose, MD  fluticasone (FLONASE) 50 MCG/ACT nasal spray Place 2 sprays into both nostrils daily. Patient not taking: Reported on  10/07/2014 06/18/14   Bonnielee Haff, MD  levofloxacin (LEVAQUIN) 750 MG tablet Take 1 tablet (750 mg total) by mouth daily. X 7 days Patient not taking: Reported on 10/20/2014 10/07/14   Elmyra Ricks Pisciotta, PA-C  predniSONE (DELTASONE) 20 MG tablet 3 tabs po day one, then 2 po daily x 4 days Patient not taking: Reported on 10/20/2014 10/07/14   Monico Blitz, PA-C    Physical Exam: Filed Vitals:   10/21/14 0230 10/21/14 0245 10/21/14 0300 10/21/14 0315  BP: 118/74 117/76 116/78 119/77  Pulse: 98 98 97 94  Temp:      TempSrc:      Resp:  23 23 16   Height:      Weight:      SpO2:  92% 90%      General:  Moderately built and nourished.  Eyes: Anicteric no pallor.  ENT: No discharge from the ears eyes nose or mouth.  Neck: No mass felt. No JVD appreciated.  Cardiovascular: S1 and S2 heard.  Respiratory: Bilateral expiratory wheezes no crepitations.  Abdomen: Soft nontender bowel sounds present.  Skin: No rash.  Musculoskeletal: No edema.  Psychiatric: Appears normal.  Neurologic: Alert awake oriented to time place and person. Moves all extremities.  Labs on Admission:  Basic Metabolic Panel:  Recent Labs Lab 10/20/14 2328  NA 139  K 3.7  CL 109  CO2 21*  GLUCOSE 98  BUN <5*  CREATININE 0.71  CALCIUM 9.2   Liver Function Tests: No results for input(s): AST, ALT, ALKPHOS, BILITOT, PROT, ALBUMIN in the last 168 hours. No results for input(s): LIPASE, AMYLASE in the last 168 hours. No results for input(s): AMMONIA in the last 168 hours. CBC:  Recent Labs Lab 10/20/14 2328  WBC 4.8  NEUTROABS 1.9  HGB 13.1  HCT 38.4*  MCV 94.3  PLT 148*   Cardiac Enzymes: No results for input(s): CKTOTAL, CKMB, CKMBINDEX, TROPONINI in the last 168 hours.  BNP (last 3 results)  Recent Labs  03/25/14 1837  BNP 30.4    ProBNP (last 3 results)  Recent Labs  01/26/14 0958  PROBNP 1619.0*    CBG: No results for input(s): GLUCAP in the last 168  hours.  Radiological Exams on Admission: Dg Chest 2 View  10/20/2014   CLINICAL DATA:  Midchest pain.  Dyspnea.  EXAM: CHEST  2 VIEW  COMPARISON:  10/07/2014  FINDINGS: There is mild interstitial coarsening which appears chronic. No superimposed acute infiltrate is evident. There is no evidence of congestive heart failure. There is no effusion. Hilar, mediastinal and cardiac contours are unremarkable and unchanged. Heart size is normal.  IMPRESSION: Chronic appearing interstitial coarsening.  No acute findings.   Electronically Signed   By: Andreas Newport M.D.   On: 10/20/2014 23:56    EKG: Independently reviewed. Normal sinus rhythm.  Assessment/Plan Principal Problem:   Acute respiratory failure with hypoxia Active Problems:  Tobacco abuse   CAD (coronary artery disease)   Chest pain   COPD exacerbation   DVT (deep venous thrombosis)   Essential hypertension   1. Acute respiratory failure with hypoxia secondary to COPD exacerbation - patient has been placed on Solu-Medrol doxycycline Pulmicort and nebulizer. Closely follow respiratory status. 2. Chest pain with history of CAD status post stenting - patient has both typical and atypical symptoms. At this time we will continue with Apixaban statins and antiplatelets agents and metoprolol and cycle cardiac markers. 3. Tobacco abuse - patient advised to quit tobacco use. 4. History of PE status post IVC filter placement and on Apixaban. 5. Hypertension - continue present medications. 6. Thrombocytopenia - follow CBC. 7. History of hepatitis C - further workup as outpatient.  I have reviewed patient's chest x-ray and EKG personally. Reviewed patient's old charts.   DVT Prophylaxis Apixaban.  Code Status: Full code.  Family Communication: Discussed with patient.  Disposition Plan: Admit to inpatient.    Joury Allcorn N. [facility] Pager [phone].  If 7PM-7AM, please contact  night-coverage [email or web address] Password [email or web address] 10/21/2014, 3:50 AM

## 2014-10-21 NOTE — Progress Notes (Signed)
Chart reviewed, symptoms more consistent with COPD exacerbation. CE's negative. EKG unchanged. Discussed with Dr. Olevia Bowens, no further cardiac work-up recommended at this time. Call with questions.  Pixie Casino, MD, [facility] Attending Cardiologist [place]

## 2014-10-21 NOTE — ED Notes (Signed)
Report called, pt c/o CP, floor states pt is not appropriate, MD notified, new orders for EKG

## 2014-10-21 NOTE — Progress Notes (Signed)
Received report from ED nurse at 01:40,ED nurse calling MD to make sure pt appropriate for our floor

## 2014-10-21 NOTE — Progress Notes (Signed)
Inpatient Diabetes Program Recommendations  AACE/ADA: New Consensus Statement on Inpatient Glycemic Control (2013)  Target Ranges:  Prepandial:   less than 140 mg/dL      Peak postprandial:   less than 180 mg/dL (1-2 hours)      Critically ill patients:  140 - 180 mg/dL   Consider monitoring CBGs during steroid therapy and start Novolog correction if elevated. Thank you  Raoul Pitch BSN, RN,CDE Inpatient Diabetes Coordinator [phone] (team pager)

## 2014-10-22 MED ORDER — DOXYCYCLINE HYCLATE 100 MG PO TABS: 100.0000 mg | ORAL_TABLET | Freq: Two times a day (BID) | ORAL | Status: DC

## 2014-10-22 MED ORDER — PREDNISONE 10 MG PO TABS: ORAL_TABLET | ORAL | Status: DC

## 2014-10-22 MED ORDER — ALBUTEROL SULFATE HFA 108 (90 BASE) MCG/ACT IN AERS: 2.0000 | INHALATION_SPRAY | Freq: Four times a day (QID) | RESPIRATORY_TRACT | Status: DC | PRN

## 2014-10-22 MED ORDER — ALBUTEROL SULFATE (2.5 MG/3ML) 0.083% IN NEBU: 2.5000 mg | INHALATION_SOLUTION | RESPIRATORY_TRACT | Status: DC | PRN

## 2014-10-22 MED ORDER — ALBUTEROL SULFATE HFA 108 (90 BASE) MCG/ACT IN AERS
2.0000 | INHALATION_SPRAY | Freq: Four times a day (QID) | RESPIRATORY_TRACT | Status: DC | PRN
  Filled 2014-10-22: qty 6.7

## 2014-10-22 NOTE — Progress Notes (Signed)
Utilization review completed. Jalan Bodi, RN, BSN. 

## 2014-10-22 NOTE — Discharge Summary (Signed)
Physician Discharge Summary  [name] FOY:[id number] DOB: [birth year] DOA: 10/20/2014  PCP: [name], MD  Admit date: 10/20/2014 Discharge date: 10/22/2014  Time spent: 35 minutes  Recommendations for Outpatient Follow-up:  1. Primary care doctor in 2-4 weeks. 2. His needs to be counsel about tobacco use.   Discharge Diagnoses:  Principal Problem:   Acute respiratory failure with hypoxia Active Problems:   Tobacco abuse   CAD (coronary artery disease)   Chest pain   COPD exacerbation   DVT (deep venous thrombosis)   Essential hypertension   Discharge Condition: stable   Filed Weights   10/20/14 2226 10/21/14 0345 10/22/14 0410  Weight: 79.379 kg (175 lb) 79.062 kg (174 lb 4.8 oz) 80.196 kg (176 lb 12.8 oz)    History of present illness:  54 y.o. male with history of CAD status post stenting last year, COPD, hypertension and PE on Apixaban presents to the ER with complaints of shortness of breath and chest discomfort over the last 2 days. Denies any fever or chills has been having some productive cough. Patient also been having chest pain which was retrosternal pressure-like also increases on deep inspiration and cough. In the ER chest x-ray does not show anything acute EKG was unremarkable cardiac markers were negative and on exam patient is having bilateral expiratory wheeze. Patient has been placed on nebulizer and steroids antibiotics and admitted for COPD exacerbation and chest pain.   Hospital Course:  Acute respiratory failure with failure with hypoxia due to COPD exacerbation: He was admitted to the hospital we'll start her on IV Solu-Medrol, Doxy and nebulizers. By the next day his respiration was much improved. Did not required oxygen. So he was transitioned to oral medications. He will go home on a seven-day treatment of prednisone and doxycycline. Continue inhalers.  Chest pain likely due to COPD exacerbation: EKG unchanged from previous cardiac  markers negative 3. No further workup.  Tobacco abuse: Unfortunately [name] continues to smoke I have advised him to quit will need further counseling as an outpatient.   History of PE: Status post IVC filter placement on Crixivan no changes were made.  Essential hypertension: No changes are made.  Procedures:  CXR  Consultations:  none  Discharge Exam: Filed Vitals:   10/22/14 0410  BP: 128/81  Pulse:   Temp: 98.5 F (36.9 C)  Resp: 18    General: A&O x3 Cardiovascular: RRR Respiratory: good air movement CTA B/L  Discharge Instructions   Discharge Instructions    Diet - low sodium heart healthy    Complete by:  As directed      Increase activity slowly    Complete by:  As directed           Current Discharge Medication List    START taking these medications   Details  doxycycline (VIBRA-TABS) 100 MG tablet Take 1 tablet (100 mg total) by mouth every 12 (twelve) hours. Qty: 10 tablet, Refills: 0      CONTINUE these medications which have CHANGED   Details  albuterol (PROVENTIL HFA;VENTOLIN HFA) 108 (90 BASE) MCG/ACT inhaler Inhale 2 puffs into the lungs every 6 (six) hours as needed for wheezing or shortness of breath. Qty: 1 Inhaler, Refills: 10    predniSONE (DELTASONE) 10 MG tablet Takes 6 tablets for 1 days, then 5 tablets for 1 days, then 4 tablets for 1 days, then 3 tablets for 1 days, then 2 tabs for 1 days, then 1 tab for 1 days, and then  stop. Qty: 21 tablet, Refills: 0      CONTINUE these medications which have NOT CHANGED   Details  apixaban (ELIQUIS) 2.5 MG TABS tablet Take 1 tablet (2.5 mg total) by mouth 2 (two) times daily. Qty: 60 tablet, Refills: 0    Fluticasone-Salmeterol (ADVAIR DISKUS) 250-50 MCG/DOSE AEPB Inhale 1 puff into the lungs 2 (two) times daily. Qty: 10 each, Refills: 3    metoprolol tartrate (LOPRESSOR) 25 MG tablet Take 0.5 tablets (12.5 mg total) by mouth 2 (two) times daily. Qty: 60 tablet, Refills: 0     montelukast (SINGULAIR) 10 MG tablet Take 1 tablet (10 mg total) by mouth at bedtime. Qty: 30 tablet, Refills: 0    nitroGLYCERIN (NITROSTAT) 0.4 MG SL tablet Place 1 tablet (0.4 mg total) under the tongue every 5 (five) minutes as needed for chest pain (CP or SOB). Qty: 30 tablet, Refills: 12    tiotropium (SPIRIVA) 18 MCG inhalation capsule Place 1 capsule (18 mcg total) into inhaler and inhale daily. Qty: 30 capsule, Refills: 12    atorvastatin (LIPITOR) 80 MG tablet Take 1 tablet (80 mg total) by mouth daily at 6 PM. Qty: 30 tablet, Refills: 0    fluticasone (FLONASE) 50 MCG/ACT nasal spray Place 2 sprays into both nostrils daily. Qty: 16 g, Refills: 0      STOP taking these medications     levofloxacin (LEVAQUIN) 750 MG tablet        No Known Allergies    The results of significant diagnostics from this hospitalization (including imaging, microbiology, ancillary and laboratory) are listed below for reference.    Significant Diagnostic Studies: Dg Chest 2 View  10/20/2014   CLINICAL DATA:  Midchest pain.  Dyspnea.  EXAM: CHEST  2 VIEW  COMPARISON:  10/07/2014  FINDINGS: There is mild interstitial coarsening which appears chronic. No superimposed acute infiltrate is evident. There is no evidence of congestive heart failure. There is no effusion. Hilar, mediastinal and cardiac contours are unremarkable and unchanged. Heart size is normal.  IMPRESSION: Chronic appearing interstitial coarsening.  No acute findings.   Electronically Signed   By: Andreas Newport M.D.   On: 10/20/2014 23:56   Dg Chest 2 View  10/07/2014   CLINICAL DATA:  Asthma.  Cough.  Shortness of breath.  EXAM: CHEST  2 VIEW  COMPARISON:  06/15/2014  FINDINGS: Thoracic spondylosis. Airway thickening is present, suggesting bronchitis or reactive airways disease.  The lungs appear otherwise clear. Cardiac and mediastinal margins appear normal. No pleural effusion.  IMPRESSION: 1. Airway thickening is present,  suggesting bronchitis or reactive airways disease. 2. Thoracic spondylosis.   Electronically Signed   By: Van Clines M.D.   On: 10/07/2014 07:28   Ct Angio Chest Pe W/cm &/or Wo Cm  10/07/2014   CLINICAL DATA:  Shortness of breath for 4 days  EXAM: CT ANGIOGRAPHY CHEST WITH CONTRAST  TECHNIQUE: Multidetector CT imaging of the chest was performed using the standard protocol during bolus administration of intravenous contrast. Multiplanar CT image reconstructions and MIPs were obtained to evaluate the vascular anatomy.  CONTRAST:  27mL OMNIPAQUE IOHEXOL 350 MG/ML SOLN  COMPARISON:  Chest CT June 15, 2014; chest radiograph October 07, 2014  FINDINGS: There is no demonstrable pulmonary embolus. There is no thoracic aortic aneurysm or dissection. There is modest atherosclerotic change in the visualized great vessels.  There is no lung edema or consolidation. There is chronic bronchial wall thickening with mild upper and lower lobe bronchiectatic change.  Thyroid  appears normal. There are multiple small mediastinal lymph nodes. There is no adenopathy by size criteria.  There is extensive coronary artery calcification. The pericardium is not thickened. There is left ventricular hypertrophy.  No focal lesion is identified in the visualized upper abdomen.  There are no blastic or lytic bone lesions. There is degenerative change in the mid to lower thoracic region.  Review of the MIP images confirms the above findings.  IMPRESSION: No demonstrable pulmonary embolus. Evidence of chronic bronchitis and mild bronchiectatic change bilaterally, stable. No lung edema or consolidation. No appreciable adenopathy. There is extensive coronary artery calcification. There is left ventricular hypertrophy.   Electronically Signed   By: Lowella Grip III M.D.   On: 10/07/2014 10:22    Microbiology: No results found for this or any previous visit (from the past 240 hour(s)).   Labs: Basic Metabolic Panel:  Recent  Labs Lab 10/20/14 2328 10/21/14 0457  NA 139 139  K 3.7 3.7  CL 109 105  CO2 21* 23  GLUCOSE 98 279*  BUN <5* 6  CREATININE 0.71 0.90  CALCIUM 9.2 9.0   Liver Function Tests:  Recent Labs Lab 10/21/14 0457  AST 42*  ALT 29  ALKPHOS 79  BILITOT 0.6  PROT 6.3*  ALBUMIN 3.1*   No results for input(s): LIPASE, AMYLASE in the last 168 hours. No results for input(s): AMMONIA in the last 168 hours. CBC:  Recent Labs Lab 10/20/14 2328 10/21/14 0457  WBC 4.8 3.5*  NEUTROABS 1.9 3.0  HGB 13.1 12.9*  HCT 38.4* 38.1*  MCV 94.3 94.5  PLT 148* 146*   Cardiac Enzymes:  Recent Labs Lab 10/21/14 0457 10/21/14 1050 10/21/14 1700  TROPONINI <0.03 <0.03 <0.03   BNP: BNP (last 3 results)  Recent Labs  03/25/14 1837  BNP 30.4    ProBNP (last 3 results)  Recent Labs  01/26/14 0958  PROBNP 1619.0*    CBG: No results for input(s): GLUCAP in the last 168 hours.     Signed:  Charlynne Cousins  [facility] 10/22/2014, 10:58 AM

## 2014-10-22 NOTE — Care Management Note (Signed)
Case Management Note  Patient Details  Name: [name] MRN: [id number] Date of Birth: [birth year]  Subjective/Objective:   Pt admitted for Acute Respiratory Failure.                  Action/Plan: Plan for d/c home today. No needs identified by CM at this time.   Expected Discharge Date:                  Expected Discharge Plan:  Home/Self Care  In-House Referral:  NA  Discharge planning Services  CM Consult  Post Acute Care Choice:  NA Choice offered to:  NA  DME Arranged:  N/A DME Agency:  NA  HH Arranged:  NA HH Agency:  NA  Status of Service:  Completed, signed off  Medicare Important Message Given:    Date Medicare IM Given:    Medicare IM give by:    Date Additional Medicare IM Given:    Additional Medicare Important Message give by:     If discussed at [place] of Stay Meetings, dates discussed:    Additional Comments:  Bethena Roys, RN 10/22/2014, 12:13 PM

## 2014-10-22 NOTE — Progress Notes (Addendum)
Assumed care from off going RN; patient resting in bed quietly with no signs of acute changes; no c/o's pain; bed in lowest position; call bell in reach  @1115  discharge papers printed; instructions given to patient; IV discontinue

## 2014-12-01 ENCOUNTER — Inpatient Hospital Stay (HOSPITAL_COMMUNITY)
Admission: EM | Admit: 2014-12-01 | Discharge: 2014-12-02 | DRG: 190 | Disposition: A | Discharge status: Home / Self Care | Payer: Medicare Other | Attending: Internal Medicine | Admitting: Internal Medicine

## 2014-12-01 ENCOUNTER — Encounter (HOSPITAL_COMMUNITY): Payer: Self-pay | Admitting: Emergency Medicine

## 2014-12-01 ENCOUNTER — Emergency Department (HOSPITAL_COMMUNITY): Payer: Medicare Other

## 2014-12-01 DIAGNOSIS — D638 Anemia in other chronic diseases classified elsewhere: Secondary | ICD-10-CM | POA: Diagnosis present

## 2014-12-01 DIAGNOSIS — Z789 Other specified health status: Secondary | ICD-10-CM

## 2014-12-01 DIAGNOSIS — J9600 Acute respiratory failure, unspecified whether with hypoxia or hypercapnia: Secondary | ICD-10-CM | POA: Diagnosis present

## 2014-12-01 DIAGNOSIS — Z86711 Personal history of pulmonary embolism: Secondary | ICD-10-CM | POA: Diagnosis not present

## 2014-12-01 DIAGNOSIS — F1410 Cocaine abuse, uncomplicated: Secondary | ICD-10-CM | POA: Diagnosis present

## 2014-12-01 DIAGNOSIS — I2510 Atherosclerotic heart disease of native coronary artery without angina pectoris: Secondary | ICD-10-CM | POA: Diagnosis present

## 2014-12-01 DIAGNOSIS — Z86718 Personal history of other venous thrombosis and embolism: Secondary | ICD-10-CM

## 2014-12-01 DIAGNOSIS — F17210 Nicotine dependence, cigarettes, uncomplicated: Secondary | ICD-10-CM | POA: Diagnosis not present

## 2014-12-01 DIAGNOSIS — B1920 Unspecified viral hepatitis C without hepatic coma: Secondary | ICD-10-CM | POA: Diagnosis present

## 2014-12-01 DIAGNOSIS — I5042 Chronic combined systolic (congestive) and diastolic (congestive) heart failure: Secondary | ICD-10-CM | POA: Diagnosis present

## 2014-12-01 DIAGNOSIS — Z7289 Other problems related to lifestyle: Secondary | ICD-10-CM

## 2014-12-01 DIAGNOSIS — I5032 Chronic diastolic (congestive) heart failure: Secondary | ICD-10-CM | POA: Diagnosis present

## 2014-12-01 DIAGNOSIS — J441 Chronic obstructive pulmonary disease with (acute) exacerbation: Secondary | ICD-10-CM | POA: Diagnosis not present

## 2014-12-01 DIAGNOSIS — R0602 Shortness of breath: Secondary | ICD-10-CM | POA: Diagnosis not present

## 2014-12-01 DIAGNOSIS — I252 Old myocardial infarction: Secondary | ICD-10-CM | POA: Diagnosis not present

## 2014-12-01 DIAGNOSIS — J9601 Acute respiratory failure with hypoxia: Secondary | ICD-10-CM | POA: Diagnosis not present

## 2014-12-01 DIAGNOSIS — Z7901 Long term (current) use of anticoagulants: Secondary | ICD-10-CM | POA: Diagnosis not present

## 2014-12-01 DIAGNOSIS — D696 Thrombocytopenia, unspecified: Secondary | ICD-10-CM | POA: Diagnosis present

## 2014-12-01 DIAGNOSIS — D649 Anemia, unspecified: Secondary | ICD-10-CM | POA: Diagnosis present

## 2014-12-01 DIAGNOSIS — J449 Chronic obstructive pulmonary disease, unspecified: Secondary | ICD-10-CM | POA: Diagnosis not present

## 2014-12-01 DIAGNOSIS — I4581 Long QT syndrome: Secondary | ICD-10-CM

## 2014-12-01 DIAGNOSIS — J45901 Unspecified asthma with (acute) exacerbation: Secondary | ICD-10-CM | POA: Diagnosis present

## 2014-12-01 DIAGNOSIS — I10 Essential (primary) hypertension: Secondary | ICD-10-CM | POA: Diagnosis present

## 2014-12-01 DIAGNOSIS — R079 Chest pain, unspecified: Secondary | ICD-10-CM | POA: Diagnosis not present

## 2014-12-01 DIAGNOSIS — R0682 Tachypnea, not elsewhere classified: Secondary | ICD-10-CM | POA: Diagnosis not present

## 2014-12-01 DIAGNOSIS — Z825 Family history of asthma and other chronic lower respiratory diseases: Secondary | ICD-10-CM

## 2014-12-01 DIAGNOSIS — R0600 Dyspnea, unspecified: Secondary | ICD-10-CM

## 2014-12-01 DIAGNOSIS — B182 Chronic viral hepatitis C: Secondary | ICD-10-CM | POA: Diagnosis present

## 2014-12-01 DIAGNOSIS — R0781 Pleurodynia: Secondary | ICD-10-CM | POA: Diagnosis present

## 2014-12-01 DIAGNOSIS — R05 Cough: Secondary | ICD-10-CM | POA: Diagnosis not present

## 2014-12-01 DIAGNOSIS — R9431 Abnormal electrocardiogram [ECG] [EKG]: Secondary | ICD-10-CM | POA: Diagnosis present

## 2014-12-01 DIAGNOSIS — Z720 Tobacco use: Secondary | ICD-10-CM | POA: Diagnosis present

## 2014-12-01 LAB — BASIC METABOLIC PANEL
Anion gap: 10 (ref 5–15)
BUN: 12 mg/dL (ref 6–20)
CHLORIDE: 110 mmol/L (ref 101–111)
CO2: 22 mmol/L (ref 22–32)
Calcium: 9.2 mg/dL (ref 8.9–10.3)
Creatinine, Ser: 1.01 mg/dL (ref 0.61–1.24)
GFR calc Af Amer: >60 mL/min (ref >60)
GFR calc non Af Amer: >60 mL/min (ref >60)
GLUCOSE: 142 mg/dL — AB (ref 65–99)
Potassium: 4 mmol/L (ref 3.5–5.1)
Sodium: 142 mmol/L (ref 135–145)

## 2014-12-01 LAB — I-STAT TROPONIN, ED: Troponin i, poc: 0 ng/mL (ref 0.00–0.08)

## 2014-12-01 LAB — CBC
HCT: 38.6 % — ABNORMAL LOW (ref 39.0–52.0)
Hemoglobin: 12.8 g/dL — ABNORMAL LOW (ref 13.0–17.0)
MCH: 31.1 pg (ref 26.0–34.0)
MCHC: 33.2 g/dL (ref 30.0–36.0)
MCV: 93.9 fL (ref 78.0–100.0)
PLATELETS: 141 10*3/uL — AB (ref 150–400)
RBC: 4.11 MIL/uL — ABNORMAL LOW (ref 4.22–5.81)
RDW: 14 % (ref 11.5–15.5)
WBC: 3.7 10*3/uL — ABNORMAL LOW (ref 4.0–10.5)

## 2014-12-01 LAB — BRAIN NATRIURETIC PEPTIDE: B Natriuretic Peptide: 137.2 pg/mL — ABNORMAL HIGH (ref 0.0–100.0)

## 2014-12-01 LAB — TSH: TSH: 0.548 u[IU]/mL (ref 0.350–4.500)

## 2014-12-01 LAB — HIV ANTIBODY (ROUTINE TESTING W REFLEX): HIV SCREEN 4TH GENERATION: NONREACTIVE

## 2014-12-01 LAB — D-DIMER, QUANTITATIVE: D-Dimer, Quant: 1.11 ug/mL-FEU — ABNORMAL HIGH (ref 0.00–0.48)

## 2014-12-01 MED ORDER — MOMETASONE FURO-FORMOTEROL FUM 100-5 MCG/ACT IN AERO
2.0000 | INHALATION_SPRAY | Freq: Two times a day (BID) | RESPIRATORY_TRACT | Status: DC
  Administered 2014-12-01 – 2014-12-02 (×3): 2 via RESPIRATORY_TRACT
  Filled 2014-12-01: qty 8.8

## 2014-12-01 MED ORDER — SODIUM CHLORIDE 0.9 % IJ SOLN
3.0000 mL | Freq: Two times a day (BID) | INTRAMUSCULAR | Status: DC
  Administered 2014-12-01 – 2014-12-02 (×3): 3 mL via INTRAVENOUS

## 2014-12-01 MED ORDER — IPRATROPIUM-ALBUTEROL 0.5-2.5 (3) MG/3ML IN SOLN
3.0000 mL | Freq: Four times a day (QID) | RESPIRATORY_TRACT | Status: DC
  Administered 2014-12-01 – 2014-12-02 (×5): 3 mL via RESPIRATORY_TRACT
  Filled 2014-12-01 (×6): qty 3

## 2014-12-01 MED ORDER — LORAZEPAM 1 MG PO TABS
0.0000 mg | ORAL_TABLET | Freq: Two times a day (BID) | ORAL | Status: DC
Start: 2014-12-03 — End: 2014-12-02

## 2014-12-01 MED ORDER — METHYLPREDNISOLONE SODIUM SUCC 125 MG IJ SOLR
60.0000 mg | Freq: Four times a day (QID) | INTRAMUSCULAR | Status: DC
  Administered 2014-12-01 – 2014-12-02 (×5): 60 mg via INTRAVENOUS
  Filled 2014-12-01 (×5): qty 2

## 2014-12-01 MED ORDER — APIXABAN 2.5 MG PO TABS
2.5000 mg | ORAL_TABLET | Freq: Two times a day (BID) | ORAL | Status: DC
  Administered 2014-12-01 – 2014-12-02 (×3): 2.5 mg via ORAL
  Filled 2014-12-01 (×3): qty 1

## 2014-12-01 MED ORDER — LORAZEPAM 2 MG/ML IJ SOLN: 1.0000 mg | Freq: Four times a day (QID) | INTRAMUSCULAR | Status: DC | PRN

## 2014-12-01 MED ORDER — LORAZEPAM 1 MG PO TABS: 1.0000 mg | ORAL_TABLET | Freq: Four times a day (QID) | ORAL | Status: DC | PRN

## 2014-12-01 MED ORDER — TIOTROPIUM BROMIDE MONOHYDRATE 18 MCG IN CAPS
18.0000 ug | ORAL_CAPSULE | Freq: Every day | RESPIRATORY_TRACT | Status: DC
  Administered 2014-12-01 – 2014-12-02 (×2): 18 ug via RESPIRATORY_TRACT
  Filled 2014-12-01: qty 5

## 2014-12-01 MED ORDER — INFLUENZA VAC SPLIT QUAD 0.5 ML IM SUSY
0.5000 mL | PREFILLED_SYRINGE | INTRAMUSCULAR | Status: AC
  Administered 2014-12-02: 0.5 mL via INTRAMUSCULAR
  Filled 2014-12-01: qty 0.5

## 2014-12-01 MED ORDER — MONTELUKAST SODIUM 10 MG PO TABS
10.0000 mg | ORAL_TABLET | Freq: Every day | ORAL | Status: DC
  Administered 2014-12-01: 10 mg via ORAL
  Filled 2014-12-01: qty 1

## 2014-12-01 MED ORDER — THIAMINE HCL 100 MG/ML IJ SOLN: 100.0000 mg | Freq: Every day | INTRAMUSCULAR | Status: DC

## 2014-12-01 MED ORDER — ALUM & MAG HYDROXIDE-SIMETH 200-200-20 MG/5ML PO SUSP: 30.0000 mL | Freq: Four times a day (QID) | ORAL | Status: DC | PRN

## 2014-12-01 MED ORDER — VITAMIN B-1 100 MG PO TABS: 100.0000 mg | ORAL_TABLET | Freq: Every day | ORAL | Status: DC

## 2014-12-01 MED ORDER — METHYLPREDNISOLONE SODIUM SUCC 125 MG IJ SOLR
125.0000 mg | Freq: Once | INTRAMUSCULAR | Status: AC
  Administered 2014-12-01: 125 mg via INTRAVENOUS
  Filled 2014-12-01: qty 2

## 2014-12-01 MED ORDER — ONDANSETRON HCL 4 MG/2ML IJ SOLN: 4.0000 mg | Freq: Four times a day (QID) | INTRAMUSCULAR | Status: DC | PRN

## 2014-12-01 MED ORDER — FOLIC ACID 1 MG PO TABS: 1.0000 mg | ORAL_TABLET | Freq: Every day | ORAL | Status: DC

## 2014-12-01 MED ORDER — GUAIFENESIN ER 600 MG PO TB12
1200.0000 mg | ORAL_TABLET | Freq: Two times a day (BID) | ORAL | Status: DC
  Administered 2014-12-01 – 2014-12-02 (×3): 1200 mg via ORAL
  Filled 2014-12-01 (×3): qty 2

## 2014-12-01 MED ORDER — DOCUSATE SODIUM 100 MG PO CAPS
100.0000 mg | ORAL_CAPSULE | Freq: Two times a day (BID) | ORAL | Status: DC
  Administered 2014-12-01 – 2014-12-02 (×3): 100 mg via ORAL
  Filled 2014-12-01 (×3): qty 1

## 2014-12-01 MED ORDER — LORAZEPAM 1 MG PO TABS: 0.0000 mg | ORAL_TABLET | Freq: Four times a day (QID) | ORAL | Status: DC

## 2014-12-01 MED ORDER — VITAMIN B-1 100 MG PO TABS
100.0000 mg | ORAL_TABLET | Freq: Every day | ORAL | Status: DC
  Administered 2014-12-01 – 2014-12-02 (×2): 100 mg via ORAL
  Filled 2014-12-01 (×2): qty 1

## 2014-12-01 MED ORDER — ONDANSETRON HCL 4 MG PO TABS: 4.0000 mg | ORAL_TABLET | Freq: Four times a day (QID) | ORAL | Status: DC | PRN

## 2014-12-01 MED ORDER — ADULT MULTIVITAMIN W/MINERALS CH
1.0000 | ORAL_TABLET | Freq: Every day | ORAL | Status: DC
Start: 2014-12-01 — End: 2014-12-02
  Administered 2014-12-01: 1 via ORAL
  Filled 2014-12-01: qty 1

## 2014-12-01 MED ORDER — ALBUTEROL (5 MG/ML) CONTINUOUS INHALATION SOLN
10.0000 mg/h | INHALATION_SOLUTION | RESPIRATORY_TRACT | Status: DC
  Administered 2014-12-01: 10 mg/h via RESPIRATORY_TRACT
  Filled 2014-12-01: qty 20

## 2014-12-01 MED ORDER — ADULT MULTIVITAMIN W/MINERALS CH: 1.0000 | ORAL_TABLET | Freq: Every day | ORAL | Status: DC

## 2014-12-01 MED ORDER — ALBUTEROL SULFATE HFA 108 (90 BASE) MCG/ACT IN AERS: 2.0000 | INHALATION_SPRAY | Freq: Four times a day (QID) | RESPIRATORY_TRACT | Status: DC | PRN

## 2014-12-01 MED ORDER — FOLIC ACID 1 MG PO TABS
1.0000 mg | ORAL_TABLET | Freq: Every day | ORAL | Status: DC
  Administered 2014-12-01 – 2014-12-02 (×2): 1 mg via ORAL
  Filled 2014-12-01 (×2): qty 1

## 2014-12-01 MED ORDER — ACETAMINOPHEN 650 MG RE SUPP
650.0000 mg | Freq: Four times a day (QID) | RECTAL | Status: DC | PRN
Start: 2014-12-01 — End: 2014-12-02

## 2014-12-01 MED ORDER — ALBUTEROL SULFATE (2.5 MG/3ML) 0.083% IN NEBU: 2.5000 mg | INHALATION_SOLUTION | RESPIRATORY_TRACT | Status: AC | PRN

## 2014-12-01 MED ORDER — CARVEDILOL 3.125 MG PO TABS
3.1250 mg | ORAL_TABLET | Freq: Two times a day (BID) | ORAL | Status: DC
  Administered 2014-12-01 – 2014-12-02 (×3): 3.125 mg via ORAL
  Filled 2014-12-01 (×3): qty 1

## 2014-12-01 MED ORDER — ACETAMINOPHEN 325 MG PO TABS: 650.0000 mg | ORAL_TABLET | Freq: Four times a day (QID) | ORAL | Status: DC | PRN

## 2014-12-01 MED ORDER — SODIUM CHLORIDE 0.9 % IV SOLN: INTRAVENOUS | Status: DC

## 2014-12-01 NOTE — H&P (Signed)
[facility] History and Physical                                                                                    [name], is a 54 y.o. male  MRN: [id number]   DOB - 11/25/1960  Admit Date - 12/01/2014  Outpatient Primary MD for the patient is [name], MD  Referring MD: Lita Mains / ER  With History of -  Past Medical History  Diagnosis Date  . Asthma   . DVT (deep venous thrombosis)     "I had ~ 10 in each leg"  . Pulmonary embolism X 1  . CAD (coronary artery disease) 10/21/13    BMS to D1  . COPD (chronic obstructive pulmonary disease)   . Myocardial infarction 09/2013  . Pneumonia X 2  . Sinus headache   . Arthritis     "hands and toes" (01/26/2014)  . CHF (congestive heart failure)   . Hypertension       Past Surgical History  Procedure Laterality Date  . Vena cava filter placement  " ~ 2012  . Coronary angioplasty with stent placement  10/21/13    BMS to D1  . Left heart catheterization with coronary angiogram N/A 10/21/2013    Procedure: LEFT HEART CATHETERIZATION WITH CORONARY ANGIOGRAM;  Surgeon: Leonie Man, MD;  Location[facility] C[facility] LAB;  Service: Cardiovascular;  Laterality: N/A;    in for   Chief Complaint  Patient presents with  . Shortness of Breath  . Chest Pain     HPI This is a  54 year old male with history of prior DVT and PE on chronic eliquis, history of CAD status post bare metal stent to D1 in July 2015, recently diagnosed hepatitis C March 2016, hypertension, COPD and continued tobacco abuse, grade 1 diastolic dysfunction based on echocardiogram, documented history of regular alcohol use, chronic anemia with chronic recurrent thrombocytopenia who presented to the hospital with abrupt onset of shortness of breath and wheezing. According to the patient Saturday evening/early Sunday morning patient began having issues with shortness of breath, paroxysmal coughing with productive white sputum without evidence of fevers or  chills although he did describe feeling hot and then cold. Patient does not utilize oxygen at home. He's had no other symptoms such as nausea vomiting or diarrhea and denies sick contacts. Symptoms became worse yesterday (Monday). He has an albuterol inhaler at home and reports there were 64 more doses available and he used all done yesterday because of increasing shortness of breath and persistent cough. He did not have any over-the-counter cough medications available. He has been having chest discomfort with taking in deep breaths.  In the ER patient was afebrile, blood pressure 154/93, pulse 86 and regular, respirations 19, room air saturations 95%. Chest x-ray was unremarkable. Patient had been given albuterol Atrovent and Solu-Medrol by EMS and continued to have coarse wheezing although respiratory effort was much less labored and improved after these interventions. Patient was also complaining of pleuritic chest pain. EKG was completed that reveals sinus rhythm without ischemic changes. Electrolyte panel was unremarkable except for mildly elevated glucose of 142, BNP 137, troponin negative, white count  3700 which is within normal limits compared to previous readings, platelets 141,000, hemoglobin stable at 12.8. Patient will be admitted with mild COPD exacerbation-observational status.   Review of Systems   In addition to the HPI above,  No Fever-chills, myalgias or other constitutional symptoms No Headache, changes with Vision or hearing, new weakness, tingling, numbness in any extremity, No problems swallowing food or Liquids, indigestion/reflux No palpitations No Abdominal pain, N/V; no melena or hematochezia, no dark tarry stools, Bowel movements are regular, No dysuria, hematuria or flank pain No new skin rashes, lesions, masses or bruises, No new joints pains-aches No recent weight gain or loss No polyuria, polydypsia or polyphagia,  *A full 10 point Review of Systems was done, except  as stated above, all other Review of Systems were negative.  Social History Social History  Substance Use Topics  . Smoking status: Current Every Day Smoker -- 0.25 packs/day for 38 years    Types: Cigarettes  . Smokeless tobacco: Never Used  . Alcohol Use: 3.6 oz/week    6 Cans of beer per week     Comment: 01/26/2014 "drink  2 40oz beers weekly"    Resides at:  Private residence  Lives with:  Girlfriend  Ambulatory status:  Without assistive devices-on disability   Family History Family History  Problem Relation Age of Onset  . Asthma Mother   . Allergies Mother   . Allergies Sister   . Allergies Brother   . Deep vein thrombosis Brother     two brothers with recurrent DVT     Prior to Admission medications   Medication Sig Start Date End Date Taking? Authorizing Provider  albuterol (PROVENTIL HFA;VENTOLIN HFA) 108 (90 BASE) MCG/ACT inhaler Inhale 2 puffs into the lungs every 6 (six) hours as needed for wheezing or shortness of breath. 10/22/14  Yes Charlynne Cousins, MD  apixaban (ELIQUIS) 2.5 MG TABS tablet Take 1 tablet (2.5 mg total) by mouth 2 (two) times daily. Patient taking differently: Take 2.5 mg by mouth daily.  10/27/13  Yes Thurnell Lose, MD  diphenhydrAMINE (BENADRYL) 25 mg capsule Take 25 mg by mouth daily as needed for allergies.   Yes Historical Provider, MD  Fluticasone-Salmeterol (ADVAIR DISKUS) 250-50 MCG/DOSE AEPB Inhale 1 puff into the lungs 2 (two) times daily. 06/18/14  Yes Bonnielee Haff, MD  metoprolol tartrate (LOPRESSOR) 25 MG tablet Take 0.5 tablets (12.5 mg total) by mouth 2 (two) times daily. 05/01/14  Yes Florencia Reasons, MD  montelukast (SINGULAIR) 10 MG tablet Take 1 tablet (10 mg total) by mouth at bedtime. 06/18/14  Yes Bonnielee Haff, MD  nitroGLYCERIN (NITROSTAT) 0.4 MG SL tablet Place 1 tablet (0.4 mg total) under the tongue every 5 (five) minutes as needed for chest pain (CP or SOB). 10/27/13  Yes Thurnell Lose, MD  tiotropium (SPIRIVA) 18 MCG  inhalation capsule Place 1 capsule (18 mcg total) into inhaler and inhale daily. 05/01/14  Yes Florencia Reasons, MD    No Known Allergies  Physical Exam  Vitals  Blood pressure 161/100, pulse 84, temperature 97.9 F (36.6 C), temperature source Oral, resp. rate 18, height 6\' 2"  (1.88 m), weight 174 lb 14.4 oz (79.334 kg), SpO2 100 %.   General:  In no acute distress, appears healthy and well nourished  Psych:  Normal affect, Denies Suicidal or Homicidal ideations, Awake Alert, Oriented X 3. Speech and thought patterns are clear and appropriate, no apparent short term memory deficits  Neuro:   No focal neurological deficits,  CN II through XII intact, Strength 5/5 all 4 extremities, Sensation intact all 4 extremities.  ENT:  Ears and Eyes appear Normal, Conjunctivae clear, PER. Moist oral mucosa without erythema or exudates.  Neck:  Supple, No lymphadenopathy appreciated  Respiratory:  Symmetrical chest wall movement, Good air movement bilaterally, with coarse inspiratory and expiratory wheezes. Room Air  Cardiac:  RRR, No Murmurs, no LE edema noted, no JVD, No carotid bruits, peripheral pulses palpable at 2+  Abdomen:  Positive bowel sounds, Soft, Non tender, Non distended,  No masses appreciated, no obvious hepatosplenomegaly  Skin:  No Cyanosis, Normal Skin Turgor, No Skin Rash or Bruise.  Extremities: Symmetrical without obvious trauma or injury,  no effusions.  Data Review  CBC  Recent Labs Lab 12/01/14 0311  WBC 3.7*  HGB 12.8*  HCT 38.6*  PLT 141*  MCV 93.9  MCH 31.1  MCHC 33.2  RDW 14.0    Chemistries   Recent Labs Lab 12/01/14 0311  NA 142  K 4.0  CL 110  CO2 22  GLUCOSE 142*  BUN 12  CREATININE 1.01  CALCIUM 9.2    estimated creatinine clearance is 93.8 mL/min (by C-G formula based on Cr of 1.01).  No results for input(s): TSH, T4TOTAL, T3FREE, THYROIDAB in the last 72 hours.  Invalid input(s): FREET3  Coagulation profile No results for input(s):  INR, PROTIME in the last 168 hours.  No results for input(s): DDIMER in the last 72 hours.  Cardiac Enzymes No results for input(s): CKMB, TROPONINI, MYOGLOBIN in the last 168 hours.  Invalid input(s): CK  Invalid input(s): POCBNP  Urinalysis    Component Value Date/Time   COLORURINE YELLOW 03/28/2012 0118   APPEARANCEUR CLEAR 03/28/2012 0118   LABSPEC 1.035* 03/28/2012 0118   PHURINE 6.0 03/28/2012 0118   GLUCOSEU >1000* 03/28/2012 0118   HGBUR NEGATIVE 03/28/2012 0118   BILIRUBINUR NEGATIVE 03/28/2012 0118   KETONESUR NEGATIVE 03/28/2012 0118   PROTEINUR NEGATIVE 03/28/2012 0118   UROBILINOGEN 0.2 03/28/2012 0118   NITRITE NEGATIVE 03/28/2012 0118   LEUKOCYTESUR NEGATIVE 03/28/2012 0118    Imaging results:   Dg Chest Port 1 View  12/01/2014   CLINICAL DATA:  Gradually worsening shortness of breath since 1 day ago. Productive cough. Left-sided chest pain. Chills.  EXAM: PORTABLE CHEST - 1 VIEW  COMPARISON:  10/20/2014  FINDINGS: The heart size and mediastinal contours are within normal limits. Both lungs are clear. The visualized skeletal structures are unremarkable.  IMPRESSION: No active disease.   Electronically Signed   By: Lucienne Capers M.D.   On: 12/01/2014 03:30     EKG: (Independently reviewed) sinus rhythm, ventricular rate 98 bpm, QTC 523, no acute ST segment T-wave changes concerning for ischemia   Assessment & Plan  Principal Problem:   Acute respiratory failure/Acute exacerbation of COPD with asthma -Admit to telemetry observation status -Since coarse wheezing will give an additional 125 as I Medrol and then will begin Solumedrol 60 mg IV every 6 hours with clear rapid transition to prednisone taper beginning on 9/7 -DuoNeb's -Continue Advair MDI -Continue Singulair -No leukocytosis or abnormal findings on x-ray and currently not coughing and clinical exam not consistent with bronchitis so will not initiate antibiotic therapy at this  juncture -Guaifenesin for mucolytic properties -Check ambulatory pulse oximetry to see if desats with ambulation  Active Problems:   Tobacco abuse -Counseled on cessation    CAD/Pleuritic chest pain -Chest pain at this juncture consistent with pleuritic and noncardiac -Initial EKG nonischemic in  appearance and troponin negative; no indication cycle troponin at this juncture -Lopressor changed to carvedilol (see below) -Not hypoxic and not tachycardic and also apparently on beta blocker before admission; apparently not taking eliquis as prescribed is notation in med rec was only taking once a day-check d-dimer (see below)    Thrombocytopenia/ Anemia -Hemoglobin stable at 12.8 and your baseline -Platelet stable in the 140,000 range noting previously have decreased into the 120,000 range    Regular alcohol consumption -Patient reported to me social alcohol use primarily on the weekends and slightly increased use during football season -As precaution initiate CIWA    HTN  -Blood pressure moderately controlled -On low-dose metoprolol at presentation-changing to carvedilol (see below)    Cocaine abuse -Patient presents with urine drug screen positive for cocaine -Previous urine drug screen stating back to 2012 have been negative for cocaine -Patient reports went to a birthday party this past Sunday and became highly intoxicated and doesn't even remember coming home and suspects he may have used cocaine and not remember doing so -Counseled regarding using cocaine in setting of known CAD -As precaution have changed Lopressor/metoprolol to carvedilol    Chronic diastolic heart failure -Finding on echocardiogram and has never had issues regarding exacerbations    QT prolongation -Noted on EKG -QTc July 2016 was 487 and was 488 in March 2016 -Avoid offending medications such as Zofran, Haldol and fluoroquinolones -Repeat EKG in a.m.    History of pulmonary embolism / DVT -Unclear when  was diagnosed with pulmonary embolism noting CT angios of the chest dating back to 2009 showed no evidence of PE although he did have IVC filter placed January 2009 -Patient on eliquis and per med rec was only taking once daily when actually should have been taking twice a day -Having pleuritic chest pain currently therefore will check d-dimer    Hepatitis C -Was diagnosed March 2016 and at time of discharge was given infectious disease clinic number and instructed to arrange for follow-up regarding possible treatment options -Patient never followed up with ID -Patient has a history of noncompliance and suspect he would not attempt to arrange appointment after discharge this visit as well so we'll ask case management to assist with arranging follow-up appointment -ck HIV- last ck's 2015    DVT Prophylaxis: Eliquis  Family Communication:   No family at bedside  Code Status:  Full code  Condition:  Stable  Discharge disposition:  Anticipate discharge within 24 hours pending improvement in/resolution of asthmatic COPD symptoms  Time spent in minutes : 60      [name],ALLISON L. ANP on 12/01/2014 at 7:59 AM  Between 7am to 7pm - Pager - [phone]  After 7pm go to [email or web address] - password [email or web address]  And look for the night coverage person covering me after hours  [facility]

## 2014-12-01 NOTE — ED Notes (Signed)
Report given [facility] -- pt going to [facility] rm [facility].

## 2014-12-01 NOTE — ED Notes (Signed)
Pt brought to ED by [facility] from home for SOB for 24 hours using inhalers with not relief, pt having productive cough with white and yellowish secretion. 5mg  Albuterol, 0.5 mg Atrovent and 125 mg of Solumedrol IV given by [facility]. VS 146/ 103, HR102, R26, 100% 2L.

## 2014-12-01 NOTE — Discharge Instructions (Addendum)
Follow with Primary MD [name], MD in 7 days   Get CBC, CMP, 2 view Chest X ray checked  by Primary MD next visit.    Activity: As tolerated with Full fall precautions use walker/cane & assistance as needed   Disposition Home     Diet: Heart Healthy  .  For Heart failure patients - Check your Weight same time everyday, if you gain over 2 pounds, or you develop in leg swelling, experience more shortness of breath or chest pain, call your Primary MD immediately. Follow Cardiac Low Salt Diet and 1.5 lit/day fluid restriction.   On your next visit with your primary care physician please Get Medicines reviewed and adjusted.   Please request your Prim.MD to go over all Hospital Tests and Procedure/Radiological results at the follow up, please get all Hospital records sent to your Prim MD by signing hospital release before you go home.   If you experience worsening of your admission symptoms, develop shortness of breath, life threatening emergency, suicidal or homicidal thoughts you must seek medical attention immediately by calling 911 or calling your MD immediately  if symptoms less severe.  You Must read complete instructions/literature along with all the possible adverse reactions/side effects for all the Medicines you take and that have been prescribed to you. Take any new Medicines after you have completely understood and accpet all the possible adverse reactions/side effects.   Do not drive, operating heavy machinery, perform activities at heights, swimming or participation in water activities or provide baby sitting services if your were admitted for syncope or siezures until you have seen by Primary MD or a Neurologist and advised to do so again.  Do not drive when taking Pain medications.    Do not take more than prescribed Pain, Sleep and Anxiety Medications  Special Instructions: If you have smoked or chewed Tobacco  in the last 2 yrs please stop smoking, stop any  regular Alcohol  and or any Recreational drug use.  Wear Seat belts while driving.   Please note  You were cared for by a hospitalist during your hospital stay. If you have any questions about your discharge medications or the care you received while you were in the hospital after you are discharged, you can call the unit and asked to speak with the hospitalist on call if the hospitalist that took care of you is not available. Once you are discharged, your primary care physician will handle any further medical issues. Please note that NO REFILLS for any discharge medications will be authorized once you are discharged, as it is imperative that you return to your primary care physician (or establish a relationship with a primary care physician if you do not have one) for your aftercare needs so that they can reassess your need for medications and monitor your lab values.        Information on my medicine - ELIQUIS (apixaban)  This medication education was reviewed with me or my healthcare representative as part of my discharge preparation.  The pharmacist that spoke with me during my hospital stay was:  Tad Moore, [facility]  Why was Eliquis prescribed for you? Eliquis was prescribed for you to reduce the risk of forming blood clots that can cause a stroke if you have a medical condition called atrial fibrillation (a type of irregular heartbeat) OR to reduce the risk of a blood clots forming after orthopedic surgery.  What do You need to know about Eliquis ? Take your  Eliquis TWICE DAILY - one tablet in the morning and one tablet in the evening with or without food.  It would be best to take the doses about the same time each day.  If you have difficulty swallowing the tablet whole please discuss with your pharmacist how to take the medication safely.  Take Eliquis exactly as prescribed by your doctor and DO NOT stop taking Eliquis without talking to the doctor who prescribed the  medication.  Stopping may increase your risk of developing a new clot or stroke.  Refill your prescription before you run out.  After discharge, you should have regular check-up appointments with your healthcare provider that is prescribing your Eliquis.  In the future your dose may need to be changed if your kidney function or weight changes by a significant amount or as you get older.  What do you do if you miss a dose? If you miss a dose, take it as soon as you remember on the same day and resume taking twice daily.  Do not take more than one dose of ELIQUIS at the same time.  Important Safety Information A possible side effect of Eliquis is bleeding. You should call your healthcare provider right away if you experience any of the following: ? Bleeding from an injury or your nose that does not stop. ? Unusual colored urine (red or dark brown) or unusual colored stools (red or black). ? Unusual bruising for unknown reasons. ? A serious fall or if you hit your head (even if there is no bleeding).  Some medicines may interact with Eliquis and might increase your risk of bleeding or clotting while on Eliquis. To help avoid this, consult your healthcare provider or pharmacist prior to using any new prescription or non-prescription medications, including herbals, vitamins, non-steroidal anti-inflammatory drugs (NSAIDs) and supplements.  This website has more information on Eliquis (apixaban): [email or web address].no.

## 2014-12-01 NOTE — Progress Notes (Signed)
D dimer 1.18 so will ck CTA chest  Erin Hearing, ANP

## 2014-12-01 NOTE — ED Provider Notes (Signed)
CSN: [id number]     Arrival date & time 12/01/14  0228 History  This chart was scribed for Julianne Rice, MD by Irene Pap, ED Scribe. This patient was seen in room D30C/D30C and patient care was started at 2:57 AM.    Chief Complaint  Patient presents with  . Shortness of Breath  . Chest Pain   The history is provided by the patient. No language interpreter was used.   HPI Comments: [name] is a 54 y.o. male with hx of asthma, DVT, CAD, COPD, MI, Pneumonia, CHF and HTN brought in by [facility] who presents to the Emergency Department complaining of gradually worsening SOB onset one day ago. Pt reports associated productive cough with white and yellow secretions, left sided chest pain, and chills. Reports worsening SOB with laying flat. Pt states that he had 64 puffs left in his inhaler and used all of them yesterday to no relief. Per triage note, pt given 5mg  Albuterol, 0.5 mg Atrovent and 125 mg of Solumedrol IV by EMS. Pt denies leg swelling. Pt takes Xarelto.  Patient's left-sided patient's left-sided chest pain is worse with deep inspiration and coughing. Does not radiate. Past Medical History  Diagnosis Date  . Asthma   . DVT (deep venous thrombosis)     "I had ~ 10 in each leg"  . Pulmonary embolism X 1  . CAD (coronary artery disease) 10/21/13    BMS to D1  . COPD (chronic obstructive pulmonary disease)   . Myocardial infarction 09/2013  . Pneumonia X 2  . Sinus headache   . Arthritis     "hands and toes" (01/26/2014)  . CHF (congestive heart failure)   . Hypertension    Past Surgical History  Procedure Laterality Date  . Vena cava filter placement  " ~ 2012  . Coronary angioplasty with stent placement  10/21/13    BMS to D1  . Left heart catheterization with coronary angiogram N/A 10/21/2013    Procedure: LEFT HEART CATHETERIZATION WITH CORONARY ANGIOGRAM;  Surgeon: Leonie Man, MD;  Location: [facility] CATH LAB;  Service: Cardiovascular;  Laterality: N/A;   Family  History  Problem Relation Age of Onset  . Asthma Mother   . Allergies Mother   . Allergies Sister   . Allergies Brother   . Deep vein thrombosis Brother     two brothers with recurrent DVT   Social History  Substance Use Topics  . Smoking status: Current Every Day Smoker -- 0.25 packs/day for 38 years    Types: Cigarettes  . Smokeless tobacco: Never Used  . Alcohol Use: 3.6 oz/week    6 Cans of beer per week     Comment: 01/26/2014 "drink  2 40oz beers weekly"    Review of Systems  Constitutional: Negative for fever and chills.  Respiratory: Positive for cough, shortness of breath and wheezing.   Cardiovascular: Positive for chest pain. Negative for palpitations and leg swelling.  Gastrointestinal: Negative for nausea, vomiting, abdominal pain and diarrhea.  Musculoskeletal: Negative for myalgias, back pain, neck pain and neck stiffness.  Skin: Negative for rash and wound.  Neurological: Negative for dizziness, weakness, light-headedness, numbness and headaches.  All other systems reviewed and are negative.     Allergies  Review of patient's allergies indicates no known allergies.  Home Medications   Prior to Admission medications   Medication Sig Start Date End Date Taking? Authorizing Provider  albuterol (PROVENTIL HFA;VENTOLIN HFA) 108 (90 BASE) MCG/ACT inhaler Inhale 2 puffs into  the lungs every 6 (six) hours as needed for wheezing or shortness of breath. 10/22/14  Yes Charlynne Cousins, MD  apixaban (ELIQUIS) 2.5 MG TABS tablet Take 1 tablet (2.5 mg total) by mouth 2 (two) times daily. Patient taking differently: Take 2.5 mg by mouth daily.  10/27/13  Yes Thurnell Lose, MD  diphenhydrAMINE (BENADRYL) 25 mg capsule Take 25 mg by mouth daily as needed for allergies.   Yes Historical Provider, MD  Fluticasone-Salmeterol (ADVAIR DISKUS) 250-50 MCG/DOSE AEPB Inhale 1 puff into the lungs 2 (two) times daily. 06/18/14  Yes Bonnielee Haff, MD  metoprolol tartrate (LOPRESSOR)  25 MG tablet Take 0.5 tablets (12.5 mg total) by mouth 2 (two) times daily. 05/01/14  Yes Florencia Reasons, MD  montelukast (SINGULAIR) 10 MG tablet Take 1 tablet (10 mg total) by mouth at bedtime. 06/18/14  Yes Bonnielee Haff, MD  nitroGLYCERIN (NITROSTAT) 0.4 MG SL tablet Place 1 tablet (0.4 mg total) under the tongue every 5 (five) minutes as needed for chest pain (CP or SOB). 10/27/13  Yes Thurnell Lose, MD  tiotropium (SPIRIVA) 18 MCG inhalation capsule Place 1 capsule (18 mcg total) into inhaler and inhale daily. 05/01/14  Yes Florencia Reasons, MD   BP 154/93 mmHg  Pulse 85  Temp(Src) 98.5 F (36.9 C) (Oral)  Resp 19  SpO2 94% Physical Exam  Constitutional: He is oriented to person, place, and time. He appears well-developed and well-nourished. No distress.  HENT:  Head: Normocephalic and atraumatic.  Mouth/Throat: Oropharynx is clear and moist.  Eyes: EOM are normal. Pupils are equal, round, and reactive to light.  Neck: Normal range of motion. Neck supple.  Cardiovascular: Normal rate and regular rhythm.   Pulmonary/Chest: No respiratory distress. He has wheezes. He has no rales. He exhibits no tenderness.  Increased respiratory effort. Diffuse wheezing throughout.  Abdominal: Soft. Bowel sounds are normal. He exhibits no distension and no mass. There is no tenderness. There is no rebound and no guarding.  Musculoskeletal: Normal range of motion. He exhibits no edema or tenderness.  No lower extremity swelling or tenderness.  Neurological: He is alert and oriented to person, place, and time.  Skin: Skin is warm and dry. No rash noted. No erythema.  Psychiatric: He has a normal mood and affect. His behavior is normal.  Nursing note and vitals reviewed.   ED Course  Procedures (including critical care time) DIAGNOSTIC STUDIES: Oxygen Saturation is 100% on RA, normal by my interpretation.    COORDINATION OF CARE: 3:00 AM-Discussed treatment plan which includes breathing treatment with pt at  bedside and pt agreed to plan.   Labs Review Labs Reviewed  BASIC METABOLIC PANEL - Abnormal; Notable for the following:    Glucose, Bld 142 (*)    All other components within normal limits  CBC - Abnormal; Notable for the following:    WBC 3.7 (*)    RBC 4.11 (*)    Hemoglobin 12.8 (*)    HCT 38.6 (*)    Platelets 141 (*)    All other components within normal limits  BRAIN NATRIURETIC PEPTIDE - Abnormal; Notable for the following:    B Natriuretic Peptide 137.2 (*)    All other components within normal limits  Randolm Idol, ED    Imaging Review Dg Chest Port 1 View  12/01/2014   CLINICAL DATA:  Gradually worsening shortness of breath since 1 day ago. Productive cough. Left-sided chest pain. Chills.  EXAM: PORTABLE CHEST - 1 VIEW  COMPARISON:  10/20/2014  FINDINGS: The heart size and mediastinal contours are within normal limits. Both lungs are clear. The visualized skeletal structures are unremarkable.  IMPRESSION: No active disease.   Electronically Signed   By: Lucienne Capers M.D.   On: 12/01/2014 03:30      EKG Interpretation   Date/Time:  Tuesday December 01 2014 02:29:09 EDT Ventricular Rate:  98 PR Interval:  187 QRS Duration: 84 QT Interval:  410 QTC Calculation: 523 R Axis:   13 Text Interpretation:  Sinus rhythm Probable left atrial enlargement  Anteroseptal infarct, old Prolonged QT interval Confirmed by Lita Mains   MD, Bronte Sabado ([id number]) on 12/01/2014 6:00:26 AM      MDM   Final diagnoses:  COPD exacerbation   I personally performed the services described in this documentation, which was scribed in my presence. The recorded information has been reviewed and is accurate.  Issue with persistent wheezing despite tenuous nebulized treatment. Discussed with [facility] and will admit to telemetry bed. Left sided chest pain is pleuritic. Doubt CAD. No evidence of ischemia on EKG.  Julianne Rice, MD 12/01/14 [phone]

## 2014-12-01 NOTE — Progress Notes (Signed)
Attempted to make apt at the ID clinic for Hep C; per Levander Campion [phone]), the Physician or PCP must send a referral before an apt can be made; Attending Md made aware. Mindi Slicker [facility] [phone]

## 2014-12-02 ENCOUNTER — Inpatient Hospital Stay (HOSPITAL_COMMUNITY): Payer: Medicare Other

## 2014-12-02 DIAGNOSIS — J441 Chronic obstructive pulmonary disease with (acute) exacerbation: Secondary | ICD-10-CM | POA: Diagnosis not present

## 2014-12-02 DIAGNOSIS — J449 Chronic obstructive pulmonary disease, unspecified: Secondary | ICD-10-CM

## 2014-12-02 LAB — CBC
HCT: 35.2 % — ABNORMAL LOW (ref 39.0–52.0)
Hemoglobin: 12 g/dL — ABNORMAL LOW (ref 13.0–17.0)
MCH: 31.7 pg (ref 26.0–34.0)
MCHC: 34.1 g/dL (ref 30.0–36.0)
MCV: 92.9 fL (ref 78.0–100.0)
PLATELETS: 132 10*3/uL — AB (ref 150–400)
RBC: 3.79 MIL/uL — ABNORMAL LOW (ref 4.22–5.81)
RDW: 13.6 % (ref 11.5–15.5)
WBC: 3.5 10*3/uL — ABNORMAL LOW (ref 4.0–10.5)

## 2014-12-02 LAB — BASIC METABOLIC PANEL
Anion gap: 9 (ref 5–15)
BUN: 20 mg/dL (ref 6–20)
CHLORIDE: 103 mmol/L (ref 101–111)
CO2: 23 mmol/L (ref 22–32)
CREATININE: 1.03 mg/dL (ref 0.61–1.24)
Calcium: 9.2 mg/dL (ref 8.9–10.3)
GFR calc Af Amer: >60 mL/min (ref >60)
GLUCOSE: 179 mg/dL — AB (ref 65–99)
Potassium: 4.2 mmol/L (ref 3.5–5.1)
SODIUM: 135 mmol/L (ref 135–145)

## 2014-12-02 MED ORDER — ADULT MULTIVITAMIN W/MINERALS CH: 1.0000 | ORAL_TABLET | ORAL | Status: DC

## 2014-12-02 MED ORDER — DOXYCYCLINE HYCLATE 100 MG PO TABS
100.0000 mg | ORAL_TABLET | Freq: Two times a day (BID) | ORAL | Status: DC
  Administered 2014-12-02: 100 mg via ORAL
  Filled 2014-12-02: qty 1

## 2014-12-02 MED ORDER — FOLIC ACID 1 MG PO TABS: 1.0000 mg | ORAL_TABLET | Freq: Every day | ORAL | Status: DC

## 2014-12-02 MED ORDER — IOHEXOL 350 MG/ML SOLN
100.0000 mL | Freq: Once | INTRAVENOUS | Status: AC | PRN
  Administered 2014-12-02: 60 mL via INTRAVENOUS

## 2014-12-02 MED ORDER — FLUTICASONE-SALMETEROL 500-50 MCG/DOSE IN AEPB: 1.0000 | INHALATION_SPRAY | Freq: Two times a day (BID) | RESPIRATORY_TRACT | Status: DC

## 2014-12-02 MED ORDER — ALBUTEROL SULFATE (2.5 MG/3ML) 0.083% IN NEBU: 2.5000 mg | INHALATION_SOLUTION | Freq: Four times a day (QID) | RESPIRATORY_TRACT | Status: DC | PRN

## 2014-12-02 MED ORDER — THIAMINE HCL 100 MG PO TABS: 100.0000 mg | ORAL_TABLET | Freq: Every day | ORAL | Status: DC

## 2014-12-02 MED ORDER — HYDROCHLOROTHIAZIDE 25 MG PO TABS: 25.0000 mg | ORAL_TABLET | Freq: Every day | ORAL | Status: DC

## 2014-12-02 MED ORDER — AZITHROMYCIN 250 MG PO TABS: 250.0000 mg | ORAL_TABLET | Freq: Every day | ORAL | Status: DC

## 2014-12-02 MED ORDER — AZITHROMYCIN 500 MG PO TABS: 500.0000 mg | ORAL_TABLET | Freq: Once | ORAL | Status: DC

## 2014-12-02 MED ORDER — PREDNISONE 5 MG PO TABS: ORAL_TABLET | ORAL | Status: DC

## 2014-12-02 MED ORDER — AMLODIPINE BESYLATE 10 MG PO TABS: 10.0000 mg | ORAL_TABLET | Freq: Every day | ORAL | Status: DC

## 2014-12-02 MED ORDER — DOXYCYCLINE HYCLATE 100 MG PO CAPS: 100.0000 mg | ORAL_CAPSULE | Freq: Two times a day (BID) | ORAL | Status: DC

## 2014-12-02 NOTE — Progress Notes (Signed)
Pt a/o, no c/o pain, pts CIWA scores have been 0, VSS, pt stable

## 2014-12-02 NOTE — Progress Notes (Addendum)
Patient being discharged home summary reviewed IV removed no complaint of pain patient alert and oriented respirating normally for patient. Medications written for at discharge and given to patient.

## 2014-12-02 NOTE — Progress Notes (Signed)
Ambulate patient in hallway oxygen saturation 97%/RA at rest, 96%/RA with ambulation. No c/o pain, shortness, patient have non productive cough. Will continue to monitor patient.

## 2014-12-02 NOTE — Discharge Summary (Signed)
[name], is a 54 y.o. male  DOB October 05, 1960  MRN [id number].  Admission date:  12/01/2014  Admitting Physician  Etta Quill, DO  Discharge Date:  12/02/2014   Primary MD  [name], MD  Recommendations for primary care physician for things to follow:   Needs PFTs, Needs to follow with pulmonary and ID within 1-2 weeks.  Monitor CBC BMP repeat 2 view chest x-ray in a week   Admission Diagnosis  COPD exacerbation [J44.1]   Discharge Diagnosis  COPD exacerbation [J44.1]     Principal Problem:   Acute exacerbation of COPD with asthma Active Problems:   History of pulmonary embolism   Thrombocytopenia   Tobacco abuse   History of DVT (deep vein thrombosis)   Regular alcohol consumption   Anemia   CAD (coronary artery disease)   HTN (hypertension)   Hepatitis C   Acute respiratory failure   Cocaine abuse   Pleuritic chest pain   Chronic diastolic heart failure   QT prolongation      Past Medical History  Diagnosis Date  . Asthma   . DVT (deep venous thrombosis)     "I had ~ 10 in each leg"  . Pulmonary embolism X 1  . CAD (coronary artery disease) 10/21/13    BMS to D1  . COPD (chronic obstructive pulmonary disease)   . Myocardial infarction 09/2013  . Pneumonia X 2  . Sinus headache   . Arthritis     "hands and toes" (01/26/2014)  . CHF (congestive heart failure)   . Hypertension     Past Surgical History  Procedure Laterality Date  . Vena cava filter placement  " ~ 2012  . Coronary angioplasty with stent placement  10/21/13    BMS to D1  . Left heart catheterization with coronary angiogram N/A 10/21/2013    Procedure: LEFT HEART CATHETERIZATION WITH CORONARY ANGIOGRAM;  Surgeon: Leonie Man, MD;  Location: [facility] CATH LAB;  Service: Cardiovascular;  Laterality: N/A;        HPI  from the history and physical done on the day of admission:   Briefly, this is a 54 year old male with a history of COPD, DVT, pulmonary embolism on chronic anticoagulation, history of myocardial infarction with grade 1 diastolic CHF. The patient presents with shortness of breath that is worse with ambulation and improved with rest. Is also improved with steroids and never laser treatments given in emergency department. On exam, he has coarse wheezing diffusely throughout. Will admit for COPD exacerbation, steroids, and nebulizer treatments. Will recheck CBC in the morning.     Hospital Course:    1. Acute hypoxic respiratory failure secondary to COPD/asthma exacerbation. Much improved after IV steroids, nebulizer treatments, aren't really minimal wheezing with good air movement bilaterally, ambulatory pulse ox on room air is above 95% without any oxygen demand. Question if he has high degree of asthma component. Will be discharged home on steroids taper, increased Advair dose, continue Singulair, and albuterol nebulizer treatment along with rescue inhaler.  We will give few days of doxycycline. We'll discontinue beta blocker in case he has reactive airway and has been requiring multiple admissions, we'll request outpatient PFT and pulmonary follow-up. He has been counseled to quit smoking and cocaine use.   2. CAD with history of chronic diastolic heart failure last EF about 50% one year ago. Currently compensated no acute issues, no chest pain, has stopped beta blocker due to #1 above have placed him on Norvasc and hydrochlorothiazide combination for now. Follow with PCP.   3. History of PE. Continue Eliquis CT angiogram does not show any acute blood clots.   4. History of smoking, alcohol, cocaine use. Counseled to quit all. No signs of DTs or withdrawal.   5. Essential hypertension. Placed on combination of Norvasc and hydrochlorothiazide. Have stopped Beta blocker due to  #1.   6. Recently diagnosed hepatitis C. Did not follow-up with ID clinic, requested to follow with ID clinic promptly.   7. Mild thrombocytopenia and anemia of chronic disease. Follow with PCP in the outpatient setting no acute issues.    Discharge Condition: Stable  Follow UP  Follow-up Information    Follow up with [name], MD. Schedule an appointment as soon as possible for a visit in 1 week.   Specialty:  Specialist   Why:  COPD   Contact information:   [address] [place] [address] [phone]       Follow up with [facility], MD. Schedule an appointment as soon as possible for a visit in 1 week.   Specialty:  Pulmonary Disease   Why:  COPD   Contact information:   [place] [address] [phone]       Follow up with [place]             . Schedule an appointment as soon as possible for a visit in 1 week.   Why:  Hep C   Contact information:   [address] [place] [address]        Consults obtained - None  Diet and Activity recommendation: See Discharge Instructions below  Discharge Instructions           Discharge Instructions    Diet - low sodium heart healthy    Complete by:  As directed      Discharge instructions    Complete by:  As directed   Follow with Primary MD [name], MD in 7 days   Get CBC, CMP, 2 view Chest X ray checked  by Primary MD next visit.    Activity: As tolerated with Full fall precautions use walker/cane & assistance as needed   Disposition Home     Diet: Heart Healthy  .  For Heart failure patients - Check your Weight same time everyday, if you gain over 2 pounds, or you develop in leg swelling, experience more shortness of breath or chest pain, call your Primary MD immediately. Follow Cardiac Low Salt Diet and 1.5 lit/day fluid restriction.   On your next visit with your primary care physician please  Get Medicines reviewed and adjusted.   Please request your Prim.MD to go over all Hospital Tests and Procedure/Radiological results at the follow up, please get all Hospital records sent to your Prim MD by signing hospital release before you go home.   If you experience worsening of your admission symptoms, develop shortness of breath, life threatening emergency, suicidal or homicidal thoughts  you must seek medical attention immediately by calling 911 or calling your MD immediately  if symptoms less severe.  You Must read complete instructions/literature along with all the possible adverse reactions/side effects for all the Medicines you take and that have been prescribed to you. Take any new Medicines after you have completely understood and accpet all the possible adverse reactions/side effects.   Do not drive, operating heavy machinery, perform activities at heights, swimming or participation in water activities or provide baby sitting services if your were admitted for syncope or siezures until you have seen by Primary MD or a Neurologist and advised to do so again.  Do not drive when taking Pain medications.    Do not take more than prescribed Pain, Sleep and Anxiety Medications  Special Instructions: If you have smoked or chewed Tobacco  in the last 2 yrs please stop smoking, stop any regular Alcohol  and or any Recreational drug use.  Wear Seat belts while driving.   Please note  You were cared for by a hospitalist during your hospital stay. If you have any questions about your discharge medications or the care you received while you were in the hospital after you are discharged, you can call the unit and asked to speak with the hospitalist on call if the hospitalist that took care of you is not available. Once you are discharged, your primary care physician will handle any further medical issues. Please note that NO REFILLS for any discharge medications will be authorized once you are  discharged, as it is imperative that you return to your primary care physician (or establish a relationship with a primary care physician if you do not have one) for your aftercare needs so that they can reassess your need for medications and monitor your lab values.     Increase activity slowly    Complete by:  As directed              Discharge Medications       Medication List    STOP taking these medications        Fluticasone-Salmeterol 250-50 MCG/DOSE Aepb  Commonly known as:  ADVAIR DISKUS  Replaced by:  Fluticasone-Salmeterol 500-50 MCG/DOSE Aepb     metoprolol tartrate 25 MG tablet  Commonly known as:  LOPRESSOR      TAKE these medications        albuterol 108 (90 BASE) MCG/ACT inhaler  Commonly known as:  PROVENTIL HFA;VENTOLIN HFA  Inhale 2 puffs into the lungs every 6 (six) hours as needed for wheezing or shortness of breath.     albuterol (2.5 MG/3ML) 0.083% nebulizer solution  Commonly known as:  PROVENTIL  Take 3 mLs (2.5 mg total) by nebulization every 6 (six) hours as needed for wheezing or shortness of breath.     amLODipine 10 MG tablet  Commonly known as:  NORVASC  Take 1 tablet (10 mg total) by mouth daily.     apixaban 2.5 MG Tabs tablet  Commonly known as:  ELIQUIS  Take 1 tablet (2.5 mg total) by mouth 2 (two) times daily.     azithromycin 250 MG tablet  Commonly known as:  ZITHROMAX  Take 1 tablet (250 mg total) by mouth daily.     diphenhydrAMINE 25 mg capsule  Commonly known as:  BENADRYL  Take 25 mg by mouth daily as needed for allergies.     doxycycline 100 MG capsule  Commonly known as:  VIBRAMYCIN  Take 1 capsule (100  mg total) by mouth 2 (two) times daily.     Fluticasone-Salmeterol 500-50 MCG/DOSE Aepb  Commonly known as:  ADVAIR DISKUS  Inhale 1 puff into the lungs 2 (two) times daily.     folic acid 1 MG tablet  Commonly known as:  FOLVITE  Take 1 tablet (1 mg total) by mouth daily.     hydrochlorothiazide 25 MG tablet    Commonly known as:  HYDRODIURIL  Take 1 tablet (25 mg total) by mouth daily.     montelukast 10 MG tablet  Commonly known as:  SINGULAIR  Take 1 tablet (10 mg total) by mouth at bedtime.     nitroGLYCERIN 0.4 MG SL tablet  Commonly known as:  NITROSTAT  Place 1 tablet (0.4 mg total) under the tongue every 5 (five) minutes as needed for chest pain (CP or SOB).     predniSONE 5 MG tablet  Commonly known as:  DELTASONE  Label  & dispense according to the schedule below. 10 Pills PO for 3 days then, 8 Pills PO for 3 days, 6 Pills PO for 3 days, 4 Pills PO for 3 days, 2 Pills PO for 3 days, 1 Pills PO for 3 days, 1/2 Pill  PO for 3 days then STOP. Total 95 pills.     thiamine 100 MG tablet  Take 1 tablet (100 mg total) by mouth daily.     tiotropium 18 MCG inhalation capsule  Commonly known as:  SPIRIVA  Place 1 capsule (18 mcg total) into inhaler and inhale daily.        Major procedures and Radiology Reports - PLEASE review detailed and final reports for all details, in brief -       Ct Angio Chest Pe W/cm &/or Wo Cm  12/02/2014   CLINICAL DATA:  Persistent productive cough, chest pain, history of DVT and PE  EXAM: CT ANGIOGRAPHY CHEST WITH CONTRAST  TECHNIQUE: Multidetector CT imaging of the chest was performed using the standard protocol during bolus administration of intravenous contrast. Multiplanar CT image reconstructions and MIPs were obtained to evaluate the vascular anatomy.  CONTRAST:  20mL OMNIPAQUE IOHEXOL 350 MG/ML SOLN  COMPARISON:  10/07/2014  FINDINGS: Sagittal images of the spine shows degenerative changes thoracic spine. Sagittal view of the sternum is unremarkable. Atherosclerotic calcifications of coronary arteries. Central airways are patent. Images of the thoracic inlet are unremarkable.  The study is of excellent technical quality. No pulmonary embolus is noted. Again noted perihilar bronchitic changes. Heart size within normal limits. No pericardial effusion.  The  visualized upper abdomen is unremarkable. Images of the lung parenchyma shows no acute infiltrate or pulmonary edema. No mediastinal hematoma or adenopathy.  No pulmonary nodules are noted. There is no bronchiectasis. No destructive bony lesions are noted. Mild paraseptal emphysematous changes. Best seen in axial image 45 (cardiac  Review of the MIP images confirms the above findings.  IMPRESSION: 1. No pulmonary embolus is noted. 2. Again noted chronic central bronchitic changes. Mild paraseptal emphysematous changes. 3. No acute infiltrate or pulmonary edema. 4. No adenopathy. Again noted extensive coronary artery calcifications.   Electronically Signed   By: Lahoma Crocker M.D.   On: 12/02/2014 08:20   Dg Chest Port 1 View  12/01/2014   CLINICAL DATA:  Gradually worsening shortness of breath since 1 day ago. Productive cough. Left-sided chest pain. Chills.  EXAM: PORTABLE CHEST - 1 VIEW  COMPARISON:  10/20/2014  FINDINGS: The heart size and mediastinal contours are within normal limits.  Both lungs are clear. The visualized skeletal structures are unremarkable.  IMPRESSION: No active disease.   Electronically Signed   By: Lucienne Capers M.D.   On: 12/01/2014 03:30    Micro Results      No results found for this or any previous visit (from the past 240 hour(s)).     Today   Subjective    [name] today has no headache,no chest abdominal pain,no new weakness tingling or numbness, feels much better     Objective   Blood pressure 151/98, pulse 73, temperature 98.1 F (36.7 C), temperature source Oral, resp. rate 18, height 6\' 2"  (1.88 m), weight 81.012 kg (178 lb 9.6 oz), SpO2 99 %.   Intake/Output Summary (Last 24 hours) at 12/02/14 0949 Last data filed at 12/02/14 0900  Gross per 24 hour  Intake   1180 ml  Output    750 ml  Net    430 ml    Exam Awake Alert, Oriented x 3, No new F.N deficits, Normal affect [place].AT,PERRAL Supple Neck,No JVD, No cervical lymphadenopathy  appriciated.  Symmetrical Chest wall movement, Good air movement bilaterally, mild wheezing RRR,No Gallops,Rubs or new Murmurs, No Parasternal Heave +ve B.Sounds, Abd Soft, Non tender, No organomegaly appriciated, No rebound -guarding or rigidity. No Cyanosis, Clubbing or edema, No new Rash or bruise   Data Review   CBC w Diff:  Lab Results  Component Value Date   WBC 3.5* 12/02/2014   HGB 12.0* 12/02/2014   HCT 35.2* 12/02/2014   HCT 33.6* 05/01/2014   PLT 132* 12/02/2014   LYMPHOPCT 11* 10/21/2014   MONOPCT 2* 10/21/2014   EOSPCT 1 10/21/2014   BASOPCT 0 10/21/2014    CMP:  Lab Results  Component Value Date   NA 135 12/02/2014   K 4.2 12/02/2014   CL 103 12/02/2014   CO2 23 12/02/2014   BUN 20 12/02/2014   CREATININE 1.03 12/02/2014   PROT 6.3* 10/21/2014   ALBUMIN 3.1* 10/21/2014   BILITOT 0.6 10/21/2014   ALKPHOS 79 10/21/2014   AST 42* 10/21/2014   ALT 29 10/21/2014  .   Total Time in preparing paper work, data evaluation and todays exam - 35 minutes  Thurnell Lose M.D on 12/02/2014 at 9:49 AM  [facility]   Office  [phone]

## 2014-12-18 ENCOUNTER — Inpatient Hospital Stay: Payer: Medicare Other | Admitting: Adult Health

## 2014-12-28 ENCOUNTER — Telehealth: Payer: Self-pay | Admitting: *Deleted

## 2014-12-28 NOTE — Telephone Encounter (Signed)
Patient called and wants to get an appt for Hep C as he was recently diagnosed. He does not have a reliable phone and will have to call back next week when coordinator is back in office. The patient was very concerned about passing to his girlfriend. He advised they are not sexually active. Advised him not to share toothbrush, razors, clippers and to use protection if they decide to become sexually active and to clean up any blood or body fluid very good with bleach and make sure he comes to the visit and the doctor will explain the rest.

## 2014-12-30 ENCOUNTER — Telehealth: Payer: Self-pay | Admitting: Lab

## 2014-12-30 NOTE — Telephone Encounter (Signed)
Recd phone message from [place] regarding this pt-Tried to call him to let him know that I dont have a referral on him and to ask him to request that one be sent by his PCP.  However, no voicemail set up to leave a message

## 2014-12-31 ENCOUNTER — Emergency Department (HOSPITAL_COMMUNITY): Payer: Medicare Other

## 2014-12-31 ENCOUNTER — Inpatient Hospital Stay (HOSPITAL_COMMUNITY)
Admission: EM | Admit: 2014-12-31 | Discharge: 2015-01-04 | DRG: 287 | Disposition: A | Discharge status: Home / Self Care | Payer: Medicare Other | Attending: Internal Medicine | Admitting: Internal Medicine

## 2014-12-31 DIAGNOSIS — N179 Acute kidney failure, unspecified: Secondary | ICD-10-CM | POA: Diagnosis present

## 2014-12-31 DIAGNOSIS — Y909 Presence of alcohol in blood, level not specified: Secondary | ICD-10-CM | POA: Diagnosis present

## 2014-12-31 DIAGNOSIS — I200 Unstable angina: Secondary | ICD-10-CM | POA: Diagnosis present

## 2014-12-31 DIAGNOSIS — F1010 Alcohol abuse, uncomplicated: Secondary | ICD-10-CM | POA: Diagnosis present

## 2014-12-31 DIAGNOSIS — Z86718 Personal history of other venous thrombosis and embolism: Secondary | ICD-10-CM

## 2014-12-31 DIAGNOSIS — D696 Thrombocytopenia, unspecified: Secondary | ICD-10-CM | POA: Diagnosis present

## 2014-12-31 DIAGNOSIS — R0789 Other chest pain: Secondary | ICD-10-CM | POA: Diagnosis not present

## 2014-12-31 DIAGNOSIS — Z79899 Other long term (current) drug therapy: Secondary | ICD-10-CM

## 2014-12-31 DIAGNOSIS — I2582 Chronic total occlusion of coronary artery: Secondary | ICD-10-CM | POA: Diagnosis present

## 2014-12-31 DIAGNOSIS — Z9114 Patient's other noncompliance with medication regimen: Secondary | ICD-10-CM

## 2014-12-31 DIAGNOSIS — J45909 Unspecified asthma, uncomplicated: Secondary | ICD-10-CM | POA: Diagnosis present

## 2014-12-31 DIAGNOSIS — F1410 Cocaine abuse, uncomplicated: Secondary | ICD-10-CM | POA: Diagnosis present

## 2014-12-31 DIAGNOSIS — Z23 Encounter for immunization: Secondary | ICD-10-CM

## 2014-12-31 DIAGNOSIS — I5042 Chronic combined systolic (congestive) and diastolic (congestive) heart failure: Secondary | ICD-10-CM | POA: Diagnosis present

## 2014-12-31 DIAGNOSIS — B1920 Unspecified viral hepatitis C without hepatic coma: Secondary | ICD-10-CM | POA: Diagnosis present

## 2014-12-31 DIAGNOSIS — I25110 Atherosclerotic heart disease of native coronary artery with unstable angina pectoris: Secondary | ICD-10-CM | POA: Diagnosis not present

## 2014-12-31 DIAGNOSIS — Z8679 Personal history of other diseases of the circulatory system: Secondary | ICD-10-CM

## 2014-12-31 DIAGNOSIS — Z720 Tobacco use: Secondary | ICD-10-CM | POA: Diagnosis present

## 2014-12-31 DIAGNOSIS — I5032 Chronic diastolic (congestive) heart failure: Secondary | ICD-10-CM | POA: Diagnosis present

## 2014-12-31 DIAGNOSIS — Z86711 Personal history of pulmonary embolism: Secondary | ICD-10-CM | POA: Diagnosis present

## 2014-12-31 DIAGNOSIS — R0602 Shortness of breath: Secondary | ICD-10-CM | POA: Diagnosis not present

## 2014-12-31 DIAGNOSIS — B182 Chronic viral hepatitis C: Secondary | ICD-10-CM | POA: Diagnosis present

## 2014-12-31 DIAGNOSIS — R079 Chest pain, unspecified: Secondary | ICD-10-CM

## 2014-12-31 DIAGNOSIS — I2510 Atherosclerotic heart disease of native coronary artery without angina pectoris: Secondary | ICD-10-CM | POA: Diagnosis present

## 2014-12-31 DIAGNOSIS — F17210 Nicotine dependence, cigarettes, uncomplicated: Secondary | ICD-10-CM | POA: Diagnosis present

## 2014-12-31 DIAGNOSIS — Z955 Presence of coronary angioplasty implant and graft: Secondary | ICD-10-CM

## 2014-12-31 DIAGNOSIS — I10 Essential (primary) hypertension: Secondary | ICD-10-CM | POA: Diagnosis present

## 2014-12-31 DIAGNOSIS — Z7951 Long term (current) use of inhaled steroids: Secondary | ICD-10-CM

## 2014-12-31 DIAGNOSIS — Y832 Surgical operation with anastomosis, bypass or graft as the cause of abnormal reaction of the patient, or of later complication, without mention of misadventure at the time of the procedure: Secondary | ICD-10-CM | POA: Diagnosis present

## 2014-12-31 DIAGNOSIS — R9431 Abnormal electrocardiogram [ECG] [EKG]: Secondary | ICD-10-CM | POA: Diagnosis present

## 2014-12-31 DIAGNOSIS — I110 Hypertensive heart disease with heart failure: Secondary | ICD-10-CM | POA: Diagnosis present

## 2014-12-31 DIAGNOSIS — Z9119 Patient's noncompliance with other medical treatment and regimen: Secondary | ICD-10-CM

## 2014-12-31 DIAGNOSIS — Z6822 Body mass index (BMI) 22.0-22.9, adult: Secondary | ICD-10-CM

## 2014-12-31 DIAGNOSIS — T82855A Stenosis of coronary artery stent, initial encounter: Secondary | ICD-10-CM | POA: Diagnosis present

## 2014-12-31 DIAGNOSIS — J449 Chronic obstructive pulmonary disease, unspecified: Secondary | ICD-10-CM | POA: Diagnosis present

## 2014-12-31 DIAGNOSIS — E876 Hypokalemia: Secondary | ICD-10-CM | POA: Diagnosis present

## 2014-12-31 DIAGNOSIS — I25119 Atherosclerotic heart disease of native coronary artery with unspecified angina pectoris: Secondary | ICD-10-CM | POA: Diagnosis present

## 2014-12-31 DIAGNOSIS — D649 Anemia, unspecified: Secondary | ICD-10-CM | POA: Diagnosis present

## 2014-12-31 DIAGNOSIS — I252 Old myocardial infarction: Secondary | ICD-10-CM

## 2014-12-31 DIAGNOSIS — Z7901 Long term (current) use of anticoagulants: Secondary | ICD-10-CM

## 2014-12-31 DIAGNOSIS — M1990 Unspecified osteoarthritis, unspecified site: Secondary | ICD-10-CM | POA: Diagnosis present

## 2014-12-31 DIAGNOSIS — Z91199 Patient's noncompliance with other medical treatment and regimen due to unspecified reason: Secondary | ICD-10-CM

## 2014-12-31 HISTORY — DX: Unspecified viral hepatitis C without hepatic coma: B19.20

## 2014-12-31 HISTORY — DX: Tobacco use: Z72.0

## 2014-12-31 HISTORY — DX: Essential (primary) hypertension: I10

## 2014-12-31 HISTORY — DX: Cocaine abuse, uncomplicated: F14.10

## 2014-12-31 HISTORY — DX: Chronic diastolic (congestive) heart failure: I50.32

## 2014-12-31 HISTORY — DX: Unspecified osteoarthritis, unspecified site: M19.90

## 2014-12-31 LAB — CBC
HEMATOCRIT: 42.7 % (ref 39.0–52.0)
HEMOGLOBIN: 14.2 g/dL (ref 13.0–17.0)
MCH: 31.9 pg (ref 26.0–34.0)
MCHC: 33.3 g/dL (ref 30.0–36.0)
MCV: 96 fL (ref 78.0–100.0)
Platelets: 126 10*3/uL — ABNORMAL LOW (ref 150–400)
RBC: 4.45 MIL/uL (ref 4.22–5.81)
RDW: 14.5 % (ref 11.5–15.5)
WBC: 3.7 10*3/uL — ABNORMAL LOW (ref 4.0–10.5)

## 2014-12-31 LAB — BASIC METABOLIC PANEL
ANION GAP: 13 (ref 5–15)
BUN: 8 mg/dL (ref 6–20)
CO2: 23 mmol/L (ref 22–32)
Calcium: 9.2 mg/dL (ref 8.9–10.3)
Chloride: 104 mmol/L (ref 101–111)
Creatinine, Ser: 1.27 mg/dL — ABNORMAL HIGH (ref 0.61–1.24)
GFR calc Af Amer: >60 mL/min (ref >60)
GFR calc non Af Amer: >60 mL/min (ref >60)
GLUCOSE: 139 mg/dL — AB (ref 65–99)
POTASSIUM: 3.6 mmol/L (ref 3.5–5.1)
Sodium: 140 mmol/L (ref 135–145)

## 2014-12-31 LAB — I-STAT TROPONIN, ED: Troponin i, poc: 0 ng/mL (ref 0.00–0.08)

## 2014-12-31 MED ORDER — NITROGLYCERIN 0.4 MG SL SUBL
0.4000 mg | SUBLINGUAL_TABLET | SUBLINGUAL | Status: DC | PRN
  Administered 2014-12-31 (×3): 0.4 mg via SUBLINGUAL
  Filled 2014-12-31: qty 1

## 2014-12-31 MED ORDER — IOHEXOL 350 MG/ML SOLN
80.0000 mL | Freq: Once | INTRAVENOUS | Status: AC | PRN
  Administered 2014-12-31: 80 mL via INTRAVENOUS

## 2014-12-31 MED ORDER — SODIUM CHLORIDE 0.9 % IV BOLUS (SEPSIS)
1000.0000 mL | Freq: Once | INTRAVENOUS | Status: AC
Start: 2014-12-31 — End: 2015-01-01
  Administered 2014-12-31: 1000 mL via INTRAVENOUS

## 2014-12-31 NOTE — ED Notes (Signed)
Pt back from CT

## 2014-12-31 NOTE — ED Provider Notes (Signed)
CSN: [id number]     Arrival date & time 12/31/14  2029 History   First MD Initiated Contact with Patient 12/31/14 2038     Chief Complaint  Patient presents with  . Chest Pain  . Near Syncope   Patient is a 54 y.o. male presenting with chest pain.  Chest Pain Pain location:  L chest Pain quality: pressure   Pain radiates to:  Does not radiate Pain radiates to the back: no   Pain severity:  Moderate Onset quality:  Sudden Timing:  Constant Progression:  Unchanged Chronicity:  New Context comment:  Working Relieved by:  Nothing Worsened by:  Nothing tried Ineffective treatments:  None tried Associated symptoms: anxiety, nausea and shortness of breath   Associated symptoms: no abdominal pain and no palpitations     Past Medical History  Diagnosis Date  . Asthma   . DVT (deep venous thrombosis)     "I had ~ 10 in each leg"  . Pulmonary embolism X 1  . CAD (coronary artery disease) 10/21/13    BMS to D1  . COPD (chronic obstructive pulmonary disease)   . Myocardial infarction 09/2013  . Pneumonia X 2  . Sinus headache   . Arthritis     "hands and toes" (01/26/2014)  . CHF (congestive heart failure)   . Hypertension    Past Surgical History  Procedure Laterality Date  . Vena cava filter placement  " ~ 2012  . Coronary angioplasty with stent placement  10/21/13    BMS to D1  . Left heart catheterization with coronary angiogram N/A 10/21/2013    Procedure: LEFT HEART CATHETERIZATION WITH CORONARY ANGIOGRAM;  Surgeon: Leonie Man, MD;  Location: [facility] CATH LAB;  Service: Cardiovascular;  Laterality: N/A;   Family History  Problem Relation Age of Onset  . Asthma Mother   . Allergies Mother   . Allergies Sister   . Allergies Brother   . Deep vein thrombosis Brother     two brothers with recurrent DVT   Social History  Substance Use Topics  . Smoking status: Current Every Day Smoker -- 0.25 packs/day for 38 years    Types: Cigarettes  . Smokeless tobacco: Never Used   . Alcohol Use: 3.6 oz/week    6 Cans of beer per week     Comment: 01/26/2014 "drink  2 40oz beers weekly"    Review of Systems  HENT: Negative for congestion.   Respiratory: Positive for shortness of breath.   Cardiovascular: Positive for chest pain. Negative for palpitations and leg swelling.  Gastrointestinal: Positive for nausea. Negative for abdominal pain.  All other systems reviewed and are negative.  Allergies  Review of patient's allergies indicates no known allergies.  Home Medications   Prior to Admission medications   Medication Sig Start Date End Date Taking? Authorizing Provider  albuterol (PROVENTIL HFA;VENTOLIN HFA) 108 (90 BASE) MCG/ACT inhaler Inhale 2 puffs into the lungs every 6 (six) hours as needed for wheezing or shortness of breath. 10/22/14   Charlynne Cousins, MD  albuterol (PROVENTIL) (2.5 MG/3ML) 0.083% nebulizer solution Take 3 mLs (2.5 mg total) by nebulization every 6 (six) hours as needed for wheezing or shortness of breath. 12/02/14   Thurnell Lose, MD  amLODipine (NORVASC) 10 MG tablet Take 1 tablet (10 mg total) by mouth daily. 12/02/14   Thurnell Lose, MD  apixaban (ELIQUIS) 2.5 MG TABS tablet Take 1 tablet (2.5 mg total) by mouth 2 (two) times daily. Patient taking  differently: Take 2.5 mg by mouth daily.  10/27/13   Thurnell Lose, MD  azithromycin (ZITHROMAX) 250 MG tablet Take 1 tablet (250 mg total) by mouth daily. 12/02/14   Thurnell Lose, MD  diphenhydrAMINE (BENADRYL) 25 mg capsule Take 25 mg by mouth daily as needed for allergies.    Historical Provider, MD  doxycycline (VIBRAMYCIN) 100 MG capsule Take 1 capsule (100 mg total) by mouth 2 (two) times daily. 12/02/14   Thurnell Lose, MD  Fluticasone-Salmeterol (ADVAIR DISKUS) 500-50 MCG/DOSE AEPB Inhale 1 puff into the lungs 2 (two) times daily. 12/02/14   Thurnell Lose, MD  folic acid (FOLVITE) 1 MG tablet Take 1 tablet (1 mg total) by mouth daily. 12/02/14   Thurnell Lose, MD   hydrochlorothiazide (HYDRODIURIL) 25 MG tablet Take 1 tablet (25 mg total) by mouth daily. 12/02/14   Thurnell Lose, MD  montelukast (SINGULAIR) 10 MG tablet Take 1 tablet (10 mg total) by mouth at bedtime. 06/18/14   Bonnielee Haff, MD  nitroGLYCERIN (NITROSTAT) 0.4 MG SL tablet Place 1 tablet (0.4 mg total) under the tongue every 5 (five) minutes as needed for chest pain (CP or SOB). 10/27/13   Thurnell Lose, MD  predniSONE (DELTASONE) 5 MG tablet Label  & dispense according to the schedule below. 10 Pills PO for 3 days then, 8 Pills PO for 3 days, 6 Pills PO for 3 days, 4 Pills PO for 3 days, 2 Pills PO for 3 days, 1 Pills PO for 3 days, 1/2 Pill  PO for 3 days then STOP. Total 95 pills. 12/02/14   Thurnell Lose, MD  thiamine 100 MG tablet Take 1 tablet (100 mg total) by mouth daily. 12/02/14   Thurnell Lose, MD  tiotropium (SPIRIVA) 18 MCG inhalation capsule Place 1 capsule (18 mcg total) into inhaler and inhale daily. 05/01/14   Florencia Reasons, MD   BP 133/84 mmHg  Pulse 94  Temp(Src) 98.3 F (36.8 C) (Oral)  Resp 20  Ht 6\' 2"  (1.88 m)  Wt 175 lb (79.379 kg)  BMI 22.46 kg/m2  SpO2 100% Physical Exam  Constitutional: He is oriented to person, place, and time. He appears well-developed and well-nourished. No distress.  HENT:  Head: Normocephalic.  Eyes: Pupils are equal, round, and reactive to light.  Neck: Normal range of motion.  Cardiovascular: Normal rate and regular rhythm.   No murmur heard. Pulmonary/Chest: Effort normal. No respiratory distress. He has no wheezes. He has no rales. He exhibits no tenderness.  Abdominal: Soft. He exhibits no distension. There is no tenderness.  Musculoskeletal: Normal range of motion. He exhibits no edema.  Neurological: He is alert and oriented to person, place, and time. He displays normal reflexes. No cranial nerve deficit. Coordination normal.  Skin: Skin is warm and dry. No rash noted. He is not diaphoretic. No erythema.  Nursing note and  vitals reviewed.   ED Course  Procedures (including critical care time)  Angiocath insertion Performed by: Roberto Scales  Consent: Verbal consent obtained. Risks and benefits: risks, benefits and alternatives were discussed  Preparation: Patient was prepped and draped in the usual sterile fashion.  Vein Location: Left AC   Ultrasound Guided  Gauge: 18  Normal blood return and flush without difficulty Patient tolerance: Patient tolerated the procedure well with no immediate complications.  Labs Review Labs Reviewed  BASIC METABOLIC PANEL - Abnormal; Notable for the following:    Glucose, Bld 139 (*)    Creatinine,  Ser 1.27 (*)    All other components within normal limits  CBC - Abnormal; Notable for the following:    WBC 3.7 (*)    Platelets 126 (*)    All other components within normal limits  I-STAT TROPOININ, ED  I-STAT TROPOININ, ED    Imaging Review Dg Chest 2 View  12/31/2014   CLINICAL DATA:  Acute onset of chest tightness and diaphoresis with hypotension  EXAM: CHEST - 2 VIEW  COMPARISON:  12/01/2014  FINDINGS: Cardiac shadow is within normal limits. The lungs are clear bilaterally. No acute infiltrate or effusion is seen. No bony abnormality is noted.  IMPRESSION: No active disease.   Electronically Signed   By: Inez Catalina M.D.   On: 12/31/2014 21:47   Ct Angio Chest Pe W/cm &/or Wo Cm  12/31/2014   CLINICAL DATA:  Mid the left chest pain today. Some short of breath. History of DVT and pulmonary embolism.  EXAM: CT ANGIOGRAPHY CHEST WITH CONTRAST  TECHNIQUE: Multidetector CT imaging of the chest was performed using the standard protocol during bolus administration of intravenous contrast. Multiplanar CT image reconstructions and MIPs were obtained to evaluate the vascular anatomy.  CONTRAST:  28mL OMNIPAQUE IOHEXOL 350 MG/ML SOLN  COMPARISON:  12/02/2014  FINDINGS: No filling defects are present in the pulmonary arterial tree to suggest acute pulmonary  thromboembolism.  No abnormal mediastinal adenopathy. Coronary artery calcifications are present.  No pneumothorax.  No pleural effusion  Lungs are clear. There is mucous filling of left upper lobe segmental airways.  Simple cyst in the left kidney.  No vertebral compression deformity.  Review of the MIP images confirms the above findings.  IMPRESSION: No evidence of acute pulmonary thromboembolism  Mucoid filling of left upper lobe segmental airways suggesting an inflammatory process of the bronchials.   Electronically Signed   By: Marybelle Killings M.D.   On: 12/31/2014 23:44   I have personally reviewed and evaluated these images and lab results as part of my medical decision-making.   EKG Interpretation   Date/Time:  Thursday December 31 2014 20:39:07 EDT Ventricular Rate:  92 PR Interval:  161 QRS Duration: 90 QT Interval:  392 QTC Calculation: 485 R Axis:   17 Text Interpretation:  Sinus rhythm Interpretation limited secondary to  artifact Borderline prolonged QT interval Confirmed by Hazle Coca [phone])  on 12/31/2014 8:46:09 PM      MDM   Patient presents emergency department today with a sudden onset of left-sided chest pressure that is similar to his previous MI last year. 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 Patient will states that he started having some relief after EMS gave aspirin and nitroglycerin. Patient has no new signs of ischemia at this time his EKG. Is feeling better after his nitroglycerin. Patient was initially hypotensive 88/40 in the field but has been normotensive here. He also states he has not been taking his anticoagulation as directed of the past 3 weeks. He has a history of PE.  CT PE was negative. First, was negative but given patient's significant cardiac history and presentation, he will be admitted to the hospitalist for ACS rule out  and further management.    Final diagnoses:  Chest pain, unspecified chest pain type  History of coronary artery disease    Roberto Scales, MD 01/01/15 [id number]  Roberto Scales, MD 01/01/15 [id number]  Quintella Reichert, MD 01/02/15 1243

## 2014-12-31 NOTE — ED Notes (Signed)
Per [place] and near syncope. 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 Pt was given 100 mL fluid, 324 asa. Pt was hypotensive 88/40. Last set of vital signs all WNL prior to arrival to ED. Pt A&Ox4, respirations equal and unlabored, skin warm and dry

## 2015-01-01 ENCOUNTER — Encounter (HOSPITAL_COMMUNITY): Payer: Self-pay

## 2015-01-01 ENCOUNTER — Inpatient Hospital Stay (HOSPITAL_COMMUNITY): Payer: Medicare Other

## 2015-01-01 DIAGNOSIS — Y832 Surgical operation with anastomosis, bypass or graft as the cause of abnormal reaction of the patient, or of later complication, without mention of misadventure at the time of the procedure: Secondary | ICD-10-CM | POA: Diagnosis not present

## 2015-01-01 DIAGNOSIS — Z79899 Other long term (current) drug therapy: Secondary | ICD-10-CM | POA: Diagnosis not present

## 2015-01-01 DIAGNOSIS — Z86711 Personal history of pulmonary embolism: Secondary | ICD-10-CM

## 2015-01-01 DIAGNOSIS — N179 Acute kidney failure, unspecified: Secondary | ICD-10-CM

## 2015-01-01 DIAGNOSIS — Z86718 Personal history of other venous thrombosis and embolism: Secondary | ICD-10-CM | POA: Diagnosis not present

## 2015-01-01 DIAGNOSIS — Z9114 Patient's other noncompliance with medication regimen: Secondary | ICD-10-CM | POA: Diagnosis not present

## 2015-01-01 DIAGNOSIS — Z8679 Personal history of other diseases of the circulatory system: Secondary | ICD-10-CM | POA: Insufficient documentation

## 2015-01-01 DIAGNOSIS — R079 Chest pain, unspecified: Secondary | ICD-10-CM | POA: Diagnosis not present

## 2015-01-01 DIAGNOSIS — I2699 Other pulmonary embolism without acute cor pulmonale: Secondary | ICD-10-CM | POA: Insufficient documentation

## 2015-01-01 DIAGNOSIS — Y909 Presence of alcohol in blood, level not specified: Secondary | ICD-10-CM | POA: Diagnosis not present

## 2015-01-01 DIAGNOSIS — I110 Hypertensive heart disease with heart failure: Secondary | ICD-10-CM | POA: Diagnosis not present

## 2015-01-01 DIAGNOSIS — Z7951 Long term (current) use of inhaled steroids: Secondary | ICD-10-CM | POA: Diagnosis not present

## 2015-01-01 DIAGNOSIS — Z9119 Patient's noncompliance with other medical treatment and regimen: Secondary | ICD-10-CM | POA: Diagnosis not present

## 2015-01-01 DIAGNOSIS — I10 Essential (primary) hypertension: Secondary | ICD-10-CM | POA: Diagnosis not present

## 2015-01-01 DIAGNOSIS — J449 Chronic obstructive pulmonary disease, unspecified: Secondary | ICD-10-CM | POA: Diagnosis not present

## 2015-01-01 DIAGNOSIS — R072 Precordial pain: Secondary | ICD-10-CM

## 2015-01-01 DIAGNOSIS — I5032 Chronic diastolic (congestive) heart failure: Secondary | ICD-10-CM | POA: Diagnosis not present

## 2015-01-01 DIAGNOSIS — E876 Hypokalemia: Secondary | ICD-10-CM | POA: Diagnosis not present

## 2015-01-01 DIAGNOSIS — T82855A Stenosis of coronary artery stent, initial encounter: Secondary | ICD-10-CM | POA: Diagnosis not present

## 2015-01-01 DIAGNOSIS — I252 Old myocardial infarction: Secondary | ICD-10-CM | POA: Diagnosis not present

## 2015-01-01 DIAGNOSIS — I2582 Chronic total occlusion of coronary artery: Secondary | ICD-10-CM | POA: Diagnosis not present

## 2015-01-01 DIAGNOSIS — I25110 Atherosclerotic heart disease of native coronary artery with unstable angina pectoris: Secondary | ICD-10-CM | POA: Diagnosis not present

## 2015-01-01 DIAGNOSIS — F1010 Alcohol abuse, uncomplicated: Secondary | ICD-10-CM | POA: Diagnosis not present

## 2015-01-01 DIAGNOSIS — J45909 Unspecified asthma, uncomplicated: Secondary | ICD-10-CM | POA: Diagnosis not present

## 2015-01-01 DIAGNOSIS — M1990 Unspecified osteoarthritis, unspecified site: Secondary | ICD-10-CM | POA: Diagnosis not present

## 2015-01-01 DIAGNOSIS — I200 Unstable angina: Secondary | ICD-10-CM | POA: Diagnosis present

## 2015-01-01 DIAGNOSIS — I4581 Long QT syndrome: Secondary | ICD-10-CM

## 2015-01-01 DIAGNOSIS — D649 Anemia, unspecified: Secondary | ICD-10-CM | POA: Diagnosis not present

## 2015-01-01 DIAGNOSIS — Z7901 Long term (current) use of anticoagulants: Secondary | ICD-10-CM | POA: Diagnosis not present

## 2015-01-01 DIAGNOSIS — I2510 Atherosclerotic heart disease of native coronary artery without angina pectoris: Secondary | ICD-10-CM | POA: Diagnosis not present

## 2015-01-01 DIAGNOSIS — Z6822 Body mass index (BMI) 22.0-22.9, adult: Secondary | ICD-10-CM | POA: Diagnosis not present

## 2015-01-01 DIAGNOSIS — D696 Thrombocytopenia, unspecified: Secondary | ICD-10-CM | POA: Diagnosis not present

## 2015-01-01 DIAGNOSIS — I82409 Acute embolism and thrombosis of unspecified deep veins of unspecified lower extremity: Secondary | ICD-10-CM | POA: Insufficient documentation

## 2015-01-01 DIAGNOSIS — Z720 Tobacco use: Secondary | ICD-10-CM | POA: Diagnosis not present

## 2015-01-01 DIAGNOSIS — Z955 Presence of coronary angioplasty implant and graft: Secondary | ICD-10-CM | POA: Diagnosis not present

## 2015-01-01 DIAGNOSIS — F17210 Nicotine dependence, cigarettes, uncomplicated: Secondary | ICD-10-CM | POA: Diagnosis not present

## 2015-01-01 DIAGNOSIS — B1920 Unspecified viral hepatitis C without hepatic coma: Secondary | ICD-10-CM | POA: Diagnosis not present

## 2015-01-01 DIAGNOSIS — Z23 Encounter for immunization: Secondary | ICD-10-CM | POA: Diagnosis not present

## 2015-01-01 DIAGNOSIS — F1410 Cocaine abuse, uncomplicated: Secondary | ICD-10-CM | POA: Diagnosis not present

## 2015-01-01 LAB — CREATININE, URINE, RANDOM: CREATININE, URINE: 229.12 mg/dL

## 2015-01-01 LAB — CBC
HCT: 34 % — ABNORMAL LOW (ref 39.0–52.0)
HEMATOCRIT: 32.3 % — AB (ref 39.0–52.0)
HEMOGLOBIN: 10.7 g/dL — AB (ref 13.0–17.0)
HEMOGLOBIN: 11.2 g/dL — AB (ref 13.0–17.0)
MCH: 31.6 pg (ref 26.0–34.0)
MCH: 31.7 pg (ref 26.0–34.0)
MCHC: 33.1 g/dL (ref 30.0–36.0)
MCHC: 32.9 g/dL (ref 30.0–36.0)
MCV: 95.3 fL (ref 78.0–100.0)
MCV: 96.3 fL (ref 78.0–100.0)
Platelets: 104 10*3/uL — ABNORMAL LOW (ref 150–400)
Platelets: 95 10*3/uL — ABNORMAL LOW (ref 150–400)
RBC: 3.39 MIL/uL — AB (ref 4.22–5.81)
RBC: 3.53 MIL/uL — AB (ref 4.22–5.81)
RDW: 14.8 % (ref 11.5–15.5)
RDW: 14.9 % (ref 11.5–15.5)
WBC: 3.1 10*3/uL — ABNORMAL LOW (ref 4.0–10.5)
WBC: 2.7 10*3/uL — ABNORMAL LOW (ref 4.0–10.5)

## 2015-01-01 LAB — COMPREHENSIVE METABOLIC PANEL
ALBUMIN: 2.6 g/dL — AB (ref 3.5–5.0)
ALT: 30 U/L (ref 17–63)
AST: 51 U/L — AB (ref 15–41)
Alkaline Phosphatase: 67 U/L (ref 38–126)
Anion gap: 11 (ref 5–15)
BUN: 7 mg/dL (ref 6–20)
CHLORIDE: 105 mmol/L (ref 101–111)
CO2: 22 mmol/L (ref 22–32)
CREATININE: 1.06 mg/dL (ref 0.61–1.24)
Calcium: 8 mg/dL — ABNORMAL LOW (ref 8.9–10.3)
GFR calc Af Amer: >60 mL/min (ref >60)
GFR calc non Af Amer: >60 mL/min (ref >60)
GLUCOSE: 188 mg/dL — AB (ref 65–99)
Potassium: 3.1 mmol/L — ABNORMAL LOW (ref 3.5–5.1)
SODIUM: 138 mmol/L (ref 135–145)
Total Bilirubin: 0.6 mg/dL (ref 0.3–1.2)
Total Protein: 4.9 g/dL — ABNORMAL LOW (ref 6.5–8.1)

## 2015-01-01 LAB — HEPARIN LEVEL (UNFRACTIONATED)
HEPARIN UNFRACTIONATED: 0.57 [IU]/mL (ref 0.30–0.70)
HEPARIN UNFRACTIONATED: 0.71 [IU]/mL — AB (ref 0.30–0.70)
Heparin Unfractionated: <0.1 IU/mL — ABNORMAL LOW (ref 0.30–0.70)

## 2015-01-01 LAB — VITAMIN B12: Vitamin B-12: 329 pg/mL (ref 180–914)

## 2015-01-01 LAB — IRON AND TIBC
IRON: 148 ug/dL (ref 45–182)
Saturation Ratios: 44 % — ABNORMAL HIGH (ref 17.9–39.5)
TIBC: 333 ug/dL (ref 250–450)
UIBC: 185 ug/dL

## 2015-01-01 LAB — LIPID PANEL
Cholesterol: 190 mg/dL (ref 0–200)
HDL: 62 mg/dL (ref >40)
LDL CALC: 116 mg/dL — AB (ref 0–99)
Total CHOL/HDL Ratio: 3.1 RATIO
Triglycerides: 60 mg/dL (ref <150)
VLDL: 12 mg/dL (ref 0–40)

## 2015-01-01 LAB — I-STAT TROPONIN, ED: Troponin i, poc: 0 ng/mL (ref 0.00–0.08)

## 2015-01-01 LAB — RAPID URINE DRUG SCREEN, HOSP PERFORMED
AMPHETAMINES: NOT DETECTED
BARBITURATES: NOT DETECTED
Benzodiazepines: NOT DETECTED
COCAINE: POSITIVE — AB
OPIATES: NOT DETECTED
TETRAHYDROCANNABINOL: NOT DETECTED

## 2015-01-01 LAB — PROTIME-INR
INR: 1.04 (ref 0.00–1.49)
Prothrombin Time: 13.8 seconds (ref 11.6–15.2)

## 2015-01-01 LAB — TROPONIN I
Troponin I: <0.03 ng/mL (ref <0.031)
Troponin I: <0.03 ng/mL (ref <0.031)
Troponin I: <0.03 ng/mL (ref <0.031)

## 2015-01-01 LAB — MAGNESIUM: MAGNESIUM: 1.6 mg/dL — AB (ref 1.7–2.4)

## 2015-01-01 LAB — BRAIN NATRIURETIC PEPTIDE: B NATRIURETIC PEPTIDE 5: 87.1 pg/mL (ref 0.0–100.0)

## 2015-01-01 LAB — FERRITIN: Ferritin: 134 ng/mL (ref 24–336)

## 2015-01-01 LAB — CBG MONITORING, ED: GLUCOSE-CAPILLARY: 127 mg/dL — AB (ref 65–99)

## 2015-01-01 LAB — RETICULOCYTES
RBC.: 3.32 MIL/uL — ABNORMAL LOW (ref 4.22–5.81)
RETIC COUNT ABSOLUTE: 69.7 10*3/uL (ref 19.0–186.0)
Retic Ct Pct: 2.1 % (ref 0.4–3.1)

## 2015-01-01 LAB — FOLATE: FOLATE: 9.5 ng/mL (ref >5.9)

## 2015-01-01 LAB — APTT: APTT: 23 s — AB (ref 24–37)

## 2015-01-01 MED ORDER — POTASSIUM CHLORIDE CRYS ER 20 MEQ PO TBCR
40.0000 meq | EXTENDED_RELEASE_TABLET | ORAL | Status: AC
  Administered 2015-01-01 (×2): 40 meq via ORAL
  Filled 2015-01-01 (×2): qty 2

## 2015-01-01 MED ORDER — MORPHINE SULFATE (PF) 2 MG/ML IV SOLN: 2.0000 mg | INTRAVENOUS | Status: DC | PRN

## 2015-01-01 MED ORDER — PNEUMOCOCCAL VAC POLYVALENT 25 MCG/0.5ML IJ INJ
0.5000 mL | INJECTION | INTRAMUSCULAR | Status: AC
  Administered 2015-01-02: 0.5 mL via INTRAMUSCULAR
  Filled 2015-01-01: qty 0.5

## 2015-01-01 MED ORDER — MONTELUKAST SODIUM 10 MG PO TABS: 10.0000 mg | ORAL_TABLET | Freq: Every day | ORAL | Status: DC

## 2015-01-01 MED ORDER — ALBUTEROL SULFATE (2.5 MG/3ML) 0.083% IN NEBU: 2.5000 mg | INHALATION_SOLUTION | Freq: Four times a day (QID) | RESPIRATORY_TRACT | Status: DC | PRN

## 2015-01-01 MED ORDER — ACETAMINOPHEN 650 MG RE SUPP: 650.0000 mg | Freq: Four times a day (QID) | RECTAL | Status: DC | PRN

## 2015-01-01 MED ORDER — HEPARIN (PORCINE) IN NACL 100-0.45 UNIT/ML-% IJ SOLN
1000.0000 [IU]/h | INTRAMUSCULAR | Status: DC
  Administered 2015-01-01: 1100 [IU]/h via INTRAVENOUS
  Administered 2015-01-01 – 2015-01-03 (×2): 1000 [IU]/h via INTRAVENOUS
  Filled 2015-01-01 (×3): qty 250

## 2015-01-01 MED ORDER — ATORVASTATIN CALCIUM 40 MG PO TABS
40.0000 mg | ORAL_TABLET | Freq: Every day | ORAL | Status: DC
  Administered 2015-01-01: 40 mg via ORAL
  Filled 2015-01-01 (×2): qty 1

## 2015-01-01 MED ORDER — APIXABAN 2.5 MG PO TABS: 2.5000 mg | ORAL_TABLET | Freq: Every day | ORAL | Status: DC

## 2015-01-01 MED ORDER — ALUM & MAG HYDROXIDE-SIMETH 200-200-20 MG/5ML PO SUSP: 30.0000 mL | Freq: Four times a day (QID) | ORAL | Status: DC | PRN

## 2015-01-01 MED ORDER — DIPHENHYDRAMINE HCL 25 MG PO CAPS
25.0000 mg | ORAL_CAPSULE | Freq: Every day | ORAL | Status: DC | PRN
  Administered 2015-01-04: 25 mg via ORAL
  Filled 2015-01-01: qty 1

## 2015-01-01 MED ORDER — ACETAMINOPHEN 325 MG PO TABS
650.0000 mg | ORAL_TABLET | Freq: Four times a day (QID) | ORAL | Status: DC | PRN
  Administered 2015-01-01 (×2): 650 mg via ORAL
  Filled 2015-01-01 (×2): qty 2

## 2015-01-01 MED ORDER — SODIUM CHLORIDE 0.9 % IV SOLN
INTRAVENOUS | Status: DC
  Administered 2015-01-01: 04:00:00 via INTRAVENOUS
  Administered 2015-01-02: 75 mL/h via INTRAVENOUS

## 2015-01-01 MED ORDER — SODIUM CHLORIDE 0.9 % IJ SOLN
3.0000 mL | Freq: Two times a day (BID) | INTRAMUSCULAR | Status: DC
  Administered 2015-01-01 – 2015-01-02 (×3): 3 mL via INTRAVENOUS

## 2015-01-01 MED ORDER — NICOTINE 21 MG/24HR TD PT24
21.0000 mg | MEDICATED_PATCH | Freq: Every day | TRANSDERMAL | Status: DC
  Filled 2015-01-01: qty 1

## 2015-01-01 MED ORDER — MOMETASONE FURO-FORMOTEROL FUM 200-5 MCG/ACT IN AERO
2.0000 | INHALATION_SPRAY | Freq: Two times a day (BID) | RESPIRATORY_TRACT | Status: DC
  Administered 2015-01-01 – 2015-01-04 (×6): 2 via RESPIRATORY_TRACT
  Filled 2015-01-01 (×2): qty 8.8

## 2015-01-01 MED ORDER — TIOTROPIUM BROMIDE MONOHYDRATE 18 MCG IN CAPS
18.0000 ug | ORAL_CAPSULE | Freq: Every day | RESPIRATORY_TRACT | Status: DC
  Administered 2015-01-01 – 2015-01-04 (×4): 18 ug via RESPIRATORY_TRACT
  Filled 2015-01-01 (×2): qty 5

## 2015-01-01 MED ORDER — REGADENOSON 0.4 MG/5ML IV SOLN
0.4000 mg | Freq: Once | INTRAVENOUS | Status: AC
  Administered 2015-01-02: 0.4 mg via INTRAVENOUS
  Filled 2015-01-01: qty 5

## 2015-01-01 MED ORDER — MAGNESIUM SULFATE 50 % IJ SOLN
3.0000 g | Freq: Once | INTRAMUSCULAR | Status: AC
  Administered 2015-01-01: 3 g via INTRAVENOUS
  Filled 2015-01-01 (×2): qty 6

## 2015-01-01 MED ORDER — AMLODIPINE BESYLATE 5 MG PO TABS: 10.0000 mg | ORAL_TABLET | Freq: Every day | ORAL | Status: DC

## 2015-01-01 MED ORDER — TRAMADOL HCL 50 MG PO TABS: 50.0000 mg | ORAL_TABLET | Freq: Four times a day (QID) | ORAL | Status: DC | PRN

## 2015-01-01 MED ORDER — HEPARIN BOLUS VIA INFUSION
4000.0000 [IU] | Freq: Once | INTRAVENOUS | Status: AC
  Administered 2015-01-01: 4000 [IU] via INTRAVENOUS
  Filled 2015-01-01: qty 4000

## 2015-01-01 MED ORDER — NITROGLYCERIN IN D5W 200-5 MCG/ML-% IV SOLN
2.0000 ug/min | INTRAVENOUS | Status: DC
  Administered 2015-01-01 (×2): 5 ug/min via INTRAVENOUS
  Administered 2015-01-01: 6.667 ug/min via INTRAVENOUS
  Filled 2015-01-01: qty 250

## 2015-01-01 NOTE — Progress Notes (Signed)
Patient states he does not want his bed alarm turned on tonight. Patient was educated and still refused it. [facility] [employer]

## 2015-01-01 NOTE — ED Notes (Signed)
Pt cbg 127

## 2015-01-01 NOTE — Progress Notes (Signed)
ANTICOAGULATION CONSULT NOTE - Initial Consult  Pharmacy Consult for heparin Indication: chest pain/ACS  No Known Allergies  Patient Measurements: Height: 6\' 2"  (188 cm) Weight: 167 lb (75.751 kg) IBW/kg (Calculated) : 82.2 Heparin Dosing Weight: 79kg  Vital Signs: Temp: 98.4 F (36.9 C) (10/07 1300) Temp Source: Oral (10/07 1300) BP: 137/89 mmHg (10/07 1300) Pulse Rate: 58 (10/07 1200)  Labs:  Recent Labs  12/31/14 2102 01/01/15 0208 01/01/15 0527 01/01/15 0528 01/01/15 0729 01/01/15 0940  HGB 14.2  --   --  10.7*  --   --   HCT 42.7  --   --  32.3*  --   --   PLT 126*  --   --  104*  --   --   APTT  --  23*  --   --   --   --   LABPROT  --  13.8  --   --   --   --   INR  --  1.04  --   --   --   --   HEPARINUNFRC  --  <0.10*  --   --   --  0.57  CREATININE 1.27*  --  1.06  --   --   --   TROPONINI  --  <0.03  --   --  <0.03  --     Estimated Creatinine Clearance: 85.4 mL/min (by C-G formula based on Cr of 1.06).   Medical History: Past Medical History  Diagnosis Date  . Asthma   . DVT (deep venous thrombosis) ([place])     "I had ~ 10 in each leg"  . Pulmonary embolism ([place])     a. 2012 - s/p IVC filter;  b. prev on eliquis - noncompliant.  Marland Kitchen CAD (coronary artery disease)     a. 09/2013 NSTEMI/PCI: LM nl, LAD 40-50%, D1 100 (2.25 x 28 Vision BMS), LCX min irregs, RI 60-70, 30, RCA 40-50/50-26ms/p, EF 45-50%.  Marland Kitchen COPD (chronic obstructive pulmonary disease) ([place])   . History of pneumonia   . Sinus headache   . Osteoarthritis     a. hands and toes.  . Chronic diastolic CHF (congestive heart failure) ([place])     a. 09/2014 EF 45-50% by LV gram;  b. 01/2014 Echo: EF 55-60%, Gr 1 DD.  Marland Kitchen Essential hypertension   . Tobacco abuse   . Hepatitis C   . Cocaine abuse     Medications:  Infusions:  . sodium chloride 75 mL/hr at 01/01/15 0336  . heparin 1,100 Units/hr (01/01/15 0323)  . nitroGLYCERIN 5 mcg/min (01/01/15 0254)    Assessment: 62 yom presented to the  ED with CP and near syncope. Started IV heparin for anticoagulation.He was on apixaban PTA for hx of PE and DVT. He has been non-compliant with this for several week though. Baseline Heparin level <0.1 (effect of apixaban is out of patient's system) thus we can monitor IV heparin infusion via heparin levels.  Baseline H/H is WNL and platelets are slightly low. Hgb down to 10.7, pltc decreased to 104K. No bleeding noted. The 6 hour heparin level is 0.57, therapeutic on heparin IV drip 1100 units/hr.  Will check 2nd 6hour heparin level to confirm HL remains therapeutic on current rate.   Goal of Therapy:  Heparin level 0.3-0.7 units/ml Monitor platelets by anticoagulation protocol: Yes   Plan:  -Continue IV heparin drip 1100 units/hr.  -Check 6 hour heparin level to confirm remains therapeutic then can check daily heparin level.  Also check CBC with 6h HL. -Daily HL and CBC  Nicole Cella, RPh Clinical Pharmacist Pager: [phone] 01/01/2015,1:40 PM

## 2015-01-01 NOTE — H&P (Addendum)
[facility] History and Physical  [name] KYH:[id number] DOB: 05-19-1960 DOA: 12/31/2014  Referring physician: ED physician PCP: [name], MD  Specialists:   Chief Complaint: chest pain  HPI: [name] is a 54 y.o. male with PMH of DVT, PE, s/p of vena cava filter placement 2012, CAD, S/P of stent placement 2015, COPD, asthma, diastolic congestive heart failure, hypertension, medication noncompliance (not take all medications for 2 weeks), who presents with chest pain.  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 It is associated with diaphoresis, nausea, lightheadedness and near syncope. Chest pain is located in the left chest initially, then involving mid chest. It is constant, 9 out of 10 in severity. Patient does not have cough, fever, chills, shortness of breath. He states that he has not been taking any of his medications in the past 2 weeks. Patient states that he started having some relief after EMS gave aspirin and nitroglycerin. Patient was initially hypotensive 88/40 in the field but has been normotensive here. Patient does not have abdominal pain, diarrhea, symptoms of a UTI, unilateral weakness.  In ED, patient was found to have negative troponin, WBC 6.7, temperature normal, no tachycardia, AKI, negative chest x-ray. CT angioigram of chest is negative for PE, but showed mucoid filling of left upper lobe segmental airways suggesting an inflammatory process of the bronchials.  Where does patient live?   At home  Can patient participate in ADLs?  Yes     Review of Systems:   General: no fevers, chills, no changes in body weight, has fatigue HEENT: no blurry vision, hearing changes or sore throat Pulm: no dyspnea, coughing, wheezing CV: has chest pain,  no palpitations Abd: has nausea, no vomiting, abdominal pain, diarrhea, constipation GU: no dysuria, burning on  urination, increased urinary frequency, hematuria  Ext: no leg edema Neuro: no unilateral weakness, numbness, or tingling, no vision change or hearing loss Skin: no rash MSK: No muscle spasm, no deformity, no limitation of range of movement in spin Heme: No easy bruising.  Travel history: No recent long distant travel.  Allergy: No Known Allergies  Past Medical History  Diagnosis Date  . Asthma   . DVT (deep venous thrombosis)     "I had ~ 10 in each leg"  . Pulmonary embolism X 1  . CAD (coronary artery disease) 10/21/13    BMS to D1  . COPD (chronic obstructive pulmonary disease)   . Myocardial infarction 09/2013  . Pneumonia X 2  . Sinus headache   . Arthritis     "hands and toes" (01/26/2014)  . CHF (congestive heart failure)   . Hypertension     Past Surgical History  Procedure Laterality Date  . Vena cava filter placement  " ~ 2012  . Coronary angioplasty with stent placement  10/21/13    BMS to D1  . Left heart catheterization with coronary angiogram N/A 10/21/2013    Procedure: LEFT HEART CATHETERIZATION WITH CORONARY ANGIOGRAM;  Surgeon: Leonie Man, MD;  Location: [facility] CATH LAB;  Service: Cardiovascular;  Laterality: N/A;    Social History:  reports that he has been smoking Cigarettes.  He has a 9.5 pack-year smoking history. He has never used smokeless tobacco. He reports that he drinks about 3.6 oz of alcohol per week. He reports that he uses illicit drugs (Cocaine).  Family History:  Family History  Problem Relation Age of Onset  . Asthma  Mother   . Allergies Mother   . Allergies Sister   . Allergies Brother   . Deep vein thrombosis Brother     two brothers with recurrent DVT     Prior to Admission medications   Medication Sig Start Date End Date Taking? Authorizing Mckynna Vanloan  albuterol (PROVENTIL HFA;VENTOLIN HFA) 108 (90 BASE) MCG/ACT inhaler Inhale 2 puffs into the lungs every 6 (six) hours as needed for wheezing or shortness of breath. 10/22/14  Yes  Charlynne Cousins, MD  albuterol (PROVENTIL) (2.5 MG/3ML) 0.083% nebulizer solution Take 3 mLs (2.5 mg total) by nebulization every 6 (six) hours as needed for wheezing or shortness of breath. 12/02/14  Yes Thurnell Lose, MD  amLODipine (NORVASC) 10 MG tablet Take 1 tablet (10 mg total) by mouth daily. 12/02/14  Yes Thurnell Lose, MD  apixaban (ELIQUIS) 2.5 MG TABS tablet Take 1 tablet (2.5 mg total) by mouth 2 (two) times daily. Patient taking differently: Take 2.5 mg by mouth daily.  10/27/13  Yes Thurnell Lose, MD  diphenhydrAMINE (BENADRYL) 25 mg capsule Take 25 mg by mouth daily as needed for allergies.   Yes Historical Amanda Steuart, MD  Fluticasone-Salmeterol (ADVAIR DISKUS) 500-50 MCG/DOSE AEPB Inhale 1 puff into the lungs 2 (two) times daily. 12/02/14  Yes Thurnell Lose, MD  hydrochlorothiazide (HYDRODIURIL) 25 MG tablet Take 1 tablet (25 mg total) by mouth daily. 12/02/14  Yes Thurnell Lose, MD  nitroGLYCERIN (NITROSTAT) 0.4 MG SL tablet Place 1 tablet (0.4 mg total) under the tongue every 5 (five) minutes as needed for chest pain (CP or SOB). 10/27/13  Yes Thurnell Lose, MD  tiotropium (SPIRIVA) 18 MCG inhalation capsule Place 1 capsule (18 mcg total) into inhaler and inhale daily. 05/01/14  Yes Florencia Reasons, MD  azithromycin (ZITHROMAX) 250 MG tablet Take 1 tablet (250 mg total) by mouth daily. Patient not taking: Reported on 12/31/2014 12/02/14   Thurnell Lose, MD  doxycycline (VIBRAMYCIN) 100 MG capsule Take 1 capsule (100 mg total) by mouth 2 (two) times daily. Patient not taking: Reported on 12/31/2014 12/02/14   Thurnell Lose, MD  folic acid (FOLVITE) 1 MG tablet Take 1 tablet (1 mg total) by mouth daily. Patient not taking: Reported on 12/31/2014 12/02/14   Thurnell Lose, MD  montelukast (SINGULAIR) 10 MG tablet Take 1 tablet (10 mg total) by mouth at bedtime. Patient not taking: Reported on 12/31/2014 06/18/14   Bonnielee Haff, MD  predniSONE (DELTASONE) 5 MG tablet Label  & dispense  according to the schedule below. 10 Pills PO for 3 days then, 8 Pills PO for 3 days, 6 Pills PO for 3 days, 4 Pills PO for 3 days, 2 Pills PO for 3 days, 1 Pills PO for 3 days, 1/2 Pill  PO for 3 days then STOP. Total 95 pills. Patient not taking: Reported on 12/31/2014 12/02/14   Thurnell Lose, MD  thiamine 100 MG tablet Take 1 tablet (100 mg total) by mouth daily. Patient not taking: Reported on 12/31/2014 12/02/14   Thurnell Lose, MD    Physical Exam: Filed Vitals:   01/01/15 0015 01/01/15 0030 01/01/15 0045 01/01/15 0100  BP: 129/79 127/82 129/81 131/83  Pulse: 73 72 69 66  Temp:      TempSrc:      Resp: 15 17 17 16   Height:      Weight:      SpO2: 99% 100% 98% 99%   General: Not in acute distress.  Dry mucus and membrane. HEENT:       Eyes: PERRL, EOMI, no scleral icterus.       ENT: No discharge from the ears and nose, no pharynx injection, no tonsillar enlargement.        Neck: No JVD, no bruit, no mass felt. Heme: No neck lymph node enlargement. Cardiac: S1/S2, RRR, No murmurs, No gallops or rubs. Pulm:  No rales, wheezing, rhonchi or rubs. Abd: Soft, nondistended, nontender, no rebound pain, no organomegaly, BS present. Ext: No pitting leg edema bilaterally. 2+DP/PT pulse bilaterally. Musculoskeletal: No joint deformities, No joint redness or warmth, no limitation of ROM in spin. Skin: No rashes.  Neuro: Alert, oriented X3, cranial nerves II-XII grossly intact, muscle strength 5/5 in all extremities, sensation to light touch intact.  Psych: Patient is not psychotic, no suicidal or hemocidal ideation.  Labs on Admission:  Basic Metabolic Panel:  Recent Labs Lab 12/31/14 2102  NA 140  K 3.6  CL 104  CO2 23  GLUCOSE 139*  BUN 8  CREATININE 1.27*  CALCIUM 9.2   Liver Function Tests: No results for input(s): AST, ALT, ALKPHOS, BILITOT, PROT, ALBUMIN in the last 168 hours. No results for input(s): LIPASE, AMYLASE in the last 168 hours. No results for input(s):  AMMONIA in the last 168 hours. CBC:  Recent Labs Lab 12/31/14 2102  WBC 3.7*  HGB 14.2  HCT 42.7  MCV 96.0  PLT 126*   Cardiac Enzymes: No results for input(s): CKTOTAL, CKMB, CKMBINDEX, TROPONINI in the last 168 hours.  BNP (last 3 results)  Recent Labs  03/25/14 1837 12/01/14 0311  BNP 30.4 137.2*    ProBNP (last 3 results)  Recent Labs  01/26/14 0958  PROBNP 1619.0*    CBG: No results for input(s): GLUCAP in the last 168 hours.  Radiological Exams on Admission: Dg Chest 2 View  12/31/2014   CLINICAL DATA:  Acute onset of chest tightness and diaphoresis with hypotension  EXAM: CHEST - 2 VIEW  COMPARISON:  12/01/2014  FINDINGS: Cardiac shadow is within normal limits. The lungs are clear bilaterally. No acute infiltrate or effusion is seen. No bony abnormality is noted.  IMPRESSION: No active disease.   Electronically Signed   By: Inez Catalina M.D.   On: 12/31/2014 21:47   Ct Angio Chest Pe W/cm &/or Wo Cm  12/31/2014   CLINICAL DATA:  Mid the left chest pain today. Some short of breath. History of DVT and pulmonary embolism.  EXAM: CT ANGIOGRAPHY CHEST WITH CONTRAST  TECHNIQUE: Multidetector CT imaging of the chest was performed using the standard protocol during bolus administration of intravenous contrast. Multiplanar CT image reconstructions and MIPs were obtained to evaluate the vascular anatomy.  CONTRAST:  65mL OMNIPAQUE IOHEXOL 350 MG/ML SOLN  COMPARISON:  12/02/2014  FINDINGS: No filling defects are present in the pulmonary arterial tree to suggest acute pulmonary thromboembolism.  No abnormal mediastinal adenopathy. Coronary artery calcifications are present.  No pneumothorax.  No pleural effusion  Lungs are clear. There is mucous filling of left upper lobe segmental airways.  Simple cyst in the left kidney.  No vertebral compression deformity.  Review of the MIP images confirms the above findings.  IMPRESSION: No evidence of acute pulmonary thromboembolism  Mucoid  filling of left upper lobe segmental airways suggesting an inflammatory process of the bronchials.   Electronically Signed   By: Marybelle Killings M.D.   On: 12/31/2014 23:44    EKG: Independently reviewed.  Abnormal findings:  QTC  506, no ischemic change   Assessment/Plan Principal Problem:   Chest pain Active Problems:   History of pulmonary embolism   History of noncompliance with medical treatment   Thrombocytopenia (HCC)   Tobacco abuse   History of DVT (deep vein thrombosis)   Anemia   Coronary artery disease involving native coronary artery of native heart without angina pectoris   Cocaine abuse   Chronic diastolic heart failure (HCC)   QT prolongation   COPD (chronic obstructive pulmonary disease) (HCC)   AKI (acute kidney injury) ([place])   Essential hypertension  Chest pain and CAD: s/p of Stent on 2015. No pneumonia by chest x-ray. CTA is negative for PE. ACS is potential differential diagnosis. Patient still has chest pain at 7 out of 10 in severity. Since pt did not take his Eliquis for 2 weeks, he needs AC. I will start Heparin for both hx of PE and possible ACS.  -Will admit to SDU due to ongoing chest pain. -start IV heparin -start NTG gtt - will admit to Tele bed  - cycle CE q6 x3 and repeat her EKG in the am  - prn Nitroglycerin and Morphine, and lipitor  - Risk factor stratification: will check FLP, UDS (his A1c was 5.6 on 04/27/14) - Consider cardiology consult if test positive for CEs  - 2d echo - I called Card in AM, will see today.  Hx of DVT and PE: s/p filter placement 2012. Supposed to take Eliquis, but has not been compliant for 2 weeks. No PE on CAT -started heparin. Will switch to Eliquis tomorrow if not elevation of cardiac enzymes  Thrombocytopenia ([place]): platelet 126. Etiology is not clear. No bleeding tendency. -Follow-up of the CBC, particularly when patient is on heparin -Follow-up with PCP  Tobacco abuse and Alcohol abuse: -Did counseling about  importance of quitting smoking -Nicotine patch  Hx of alcohol abuse: pt states that he did not drink alcohol for more than one week. Feels OK. No signs of withdrawal. -Observe closely, will start CIWA protocol if shows any signs of withdrawal  Chronic diastolic heart failure ([place]): 2-D echo on 01/28/14 showed EF of 55-60% with grade 1 diastolic dysfunction. Patient is on HCTZ only at home. CHF is compensated. Patient does not have any leg edema. -Check BNP -Hold HCTZ due to AKI  AKI: Likely due to prerenal secondary to dehydration - IVF: 1L ns and then 75 cc/h - Check FeUrea - US-renal - Follow up renal function by BMP - Hold HCTZ  HTN: not taking home med, amlodipine for 2 weeks - on NTG gtt now - will need to resume amlodipine after stopping NTG gtt.  QT prolongation: - tele monitoring -avoid using QT prolongation medicines  COPD (chronic obstructive pulmonary disease) (HCC) and Asthma: stable. No signs of acute exacerbation -RT home breathing treatment  DVT ppx: on IV Heparin   Code Status: DNR Family Communication: None at bed side. Disposition Plan: Admit to inpatient   Date of Service 01/01/2015    Ivor Costa [facility] Pager [phone]  If 7PM-7AM, please contact night-coverage [email or web address] Password [email or web address] 01/01/2015, 1:53 AM

## 2015-01-01 NOTE — Progress Notes (Signed)
I have seen and assessed patient and agree with Dr Blaine Hamper assessment and plan. Cardiology ff and patient for probable inpatient myoview.

## 2015-01-01 NOTE — ED Notes (Signed)
Cards at bedside, will change pt's bed status to tele due to lack of chest pain, no need for drip titration.

## 2015-01-01 NOTE — ED Notes (Signed)
Sandwich bag, PB/crackers, soda given

## 2015-01-01 NOTE — Progress Notes (Signed)
PHARMACY NOTE  Consult :  Heparin Indication :  Chest pain/ACS  Heparin Dosing Wt :  79 kg  LABS :  Recent Labs  12/31/14 2102 01/01/15 0208 01/01/15 0527 01/01/15 0528 01/01/15 0940 01/01/15 1604  HGB 14.2  --   --  10.7*  --  11.2*  HCT 42.7  --   --  32.3*  --  34.0*  PLT 126*  --   --  104*  --  95*  APTT  --  23*  --   --   --   --   LABPROT  --  13.8  --   --   --   --   INR  --  1.04  --   --   --   --   HEPARINUNFRC  --  <0.10*  --   --  0.57 0.71*  CREATININE 1.27*  --  1.06  --   --   --     MEDICATION: Infusion[s]: Infusions:  . sodium chloride 75 mL/hr at 01/01/15 1800  . heparin 1,100 Units/hr (01/01/15 0323)  . nitroGLYCERIN 5 mcg/min (01/01/15 0254)   ASSESSMENT :  54 y.o. male is currently on a Heparin infusion for chest pain/ACS.   Heparin infusing at 1100 units/hr. Heparin level 0.71 units/ml and trending up.  No evidence of bleeding complications observed.  GOAL : Heparin Level  0.3 - 0.7 units/ml  PLAN : 1. Heparin will be decreased to 1000 units/hr.   The next Heparin Level will be drawn with the AM labs. 2. Daily Heparin level, CBC while on Heparin.  Monitor for bleeding complications. Follow Platelet counts.  Marthenia Rolling,  Pharm.D   01/01/2015,  6:38 PM

## 2015-01-01 NOTE — Progress Notes (Signed)
ANTICOAGULATION CONSULT NOTE - Initial Consult  Pharmacy Consult for heparin Indication: chest pain/ACS  No Known Allergies  Patient Measurements: Height: 6\' 2"  (188 cm) Weight: 175 lb (79.379 kg) IBW/kg (Calculated) : 82.2 Heparin Dosing Weight: 79kg  Vital Signs: Temp: 98.3 F (36.8 C) (10/06 2036) Temp Source: Oral (10/06 2036) BP: 131/83 mmHg (10/07 0100) Pulse Rate: 66 (10/07 0100)  Labs:  Recent Labs  12/31/14 2102  HGB 14.2  HCT 42.7  PLT 126*  CREATININE 1.27*    Estimated Creatinine Clearance: 74.7 mL/min (by C-G formula based on Cr of 1.27).   Medical History: Past Medical History  Diagnosis Date  . Asthma   . DVT (deep venous thrombosis)     "I had ~ 10 in each leg"  . Pulmonary embolism X 1  . CAD (coronary artery disease) 10/21/13    BMS to D1  . COPD (chronic obstructive pulmonary disease)   . Myocardial infarction 09/2013  . Pneumonia X 2  . Sinus headache   . Arthritis     "hands and toes" (01/26/2014)  . CHF (congestive heart failure)   . Hypertension     Medications:  Infusions:  . sodium chloride    . heparin    . nitroGLYCERIN      Assessment: 89 yom presented to the ED with CP and near syncope. To start IV heparin for anticoagulation. Also he was on apixaban PTA for hx of PE and DVT. He has been non-compliant with this for several week though. Baseline H/H is WNL and platelets are slightly low.   Goal of Therapy:  Heparin level 0.3-0.7 units/ml Monitor platelets by anticoagulation protocol: Yes   Plan:  - Check baseline aPTT and HL just to be sure apixaban is out of his system and that pt report is accurate - Heparin bolus 4000 units IV x 1 - Heparin gtt 1100 units/hr (previously therapeutic on this rate) - Check a 6 hour HL and/or aPTT - Daily HL and CBC  Kiyomi Pallo, Rande Lawman 01/01/2015,1:58 AM

## 2015-01-01 NOTE — Consult Note (Signed)
Patient ID: [name] MRN: [id number], DOB/AGE: December 06, 1960   Admit date: 12/31/2014  Primary Physician: [name], MD Primary Cardiologist: Einar Crow, MD   Pt. Profile: Pt is a 54 y.o. male with PMH of DVT, PE, s/p of vena cava filter placement 2012, CAD, S/P of stent placement of the LAD 2015, COPD, asthma, diastolic congestive heart failure, hypertension, medication noncompliance who presented to the ED with left sided chest pain started started yesterday around 1930. Cardiology has been consulted.   Problem List  Past Medical History  Diagnosis Date  . Asthma   . DVT (deep venous thrombosis) ([place])     "I had ~ 10 in each leg"  . Pulmonary embolism ([place])     a. 2012 - s/p IVC filter;  b. prev on eliquis - noncompliant.  Marland Kitchen CAD (coronary artery disease)     a. 09/2013 NSTEMI/PCI: LM nl, LAD 40-50%, D1 100 (2.25 x 28 Vision BMS), LCX min irregs, RI 60-70, 30, RCA 40-50/50-76ms/p, EF 45-50%.  Marland Kitchen COPD (chronic obstructive pulmonary disease) ([place])   . History of pneumonia   . Sinus headache   . Osteoarthritis     a. hands and toes.  . Chronic diastolic CHF (congestive heart failure) ([place])     a. 09/2014 EF 45-50% by LV gram;  b. 01/2014 Echo: EF 55-60%, Gr 1 DD.  Marland Kitchen Essential hypertension   . Tobacco abuse   . Hepatitis C   . Cocaine abuse     Past Surgical History  Procedure Laterality Date  . Vena cava filter placement  " ~ 2012  . Coronary angioplasty with stent placement  10/21/13    BMS to D1  . Left heart catheterization with coronary angiogram N/A 10/21/2013    Procedure: LEFT HEART CATHETERIZATION WITH CORONARY ANGIOGRAM;  Surgeon: Leonie Man, MD;  Location: [facility] CATH LAB;  Service: Cardiovascular;  Laterality: N/A;     Allergies  No Known Allergies  HPI  54 y/o male with the above complex problem list.  His is s/p NSTEMI with PCI/BMS to the [place] in 09/2014.  EF was 45-50% at the time but normalized by 01/2014 echo.  He was admitted to [facility] last month  with COPD flare but has since been doing relatively well.  He has been noncompliant with his meds however and has been off of [place] and antihypertensives.  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 He said he also became diaphoretic, but denies SOB, n, or v with the pain. Chest pain is described as "someone standing on my chest" and was worse with deep breathing and also palpation over the left chest.  He was scene by paramedics on the seen and eventually taken to the [facility] ED via EMS.  Upon arrival, ECG was non-acute and troponin was negative.  CTA was neg for PE.  Approximately 2 hrs after arrival, he was treated with sl NTG x 3 with significant improvement in c/p but not complete relief (dropped from an 8 to a 3).  He remained at a 3 the remainder of the night but is now pain free.  We have been asked to eval.   Home Medications  Prior to Admission medications   Medication Sig Start Date End Date Taking? Authorizing Provider  albuterol (PROVENTIL HFA;VENTOLIN HFA)  108 (90 BASE) MCG/ACT inhaler Inhale 2 puffs into the lungs every 6 (six) hours as needed for wheezing or shortness of breath. 10/22/14  Yes Charlynne Cousins, MD  albuterol (PROVENTIL) (2.5 MG/3ML) 0.083% nebulizer solution Take 3 mLs (2.5 mg total) by nebulization every 6 (six) hours as needed for wheezing or shortness of breath. 12/02/14  Yes Thurnell Lose, MD  amLODipine (NORVASC) 10 MG tablet Take 1 tablet (10 mg total) by mouth daily. 12/02/14  Yes Thurnell Lose, MD  apixaban (ELIQUIS) 2.5 MG TABS tablet Take 1 tablet (2.5 mg total) by mouth 2 (two) times daily. Patient taking differently: Take 2.5 mg by mouth daily.  10/27/13  Yes Thurnell Lose, MD  diphenhydrAMINE (BENADRYL) 25 mg capsule Take 25 mg by mouth daily as needed for allergies.   Yes  Historical Provider, MD  Fluticasone-Salmeterol (ADVAIR DISKUS) 500-50 MCG/DOSE AEPB Inhale 1 puff into the lungs 2 (two) times daily. 12/02/14  Yes Thurnell Lose, MD  hydrochlorothiazide (HYDRODIURIL) 25 MG tablet Take 1 tablet (25 mg total) by mouth daily. 12/02/14  Yes Thurnell Lose, MD  nitroGLYCERIN (NITROSTAT) 0.4 MG SL tablet Place 1 tablet (0.4 mg total) under the tongue every 5 (five) minutes as needed for chest pain (CP or SOB). 10/27/13  Yes Thurnell Lose, MD  tiotropium (SPIRIVA) 18 MCG inhalation capsule Place 1 capsule (18 mcg total) into inhaler and inhale daily. 05/01/14  Yes Florencia Reasons, MD  azithromycin (ZITHROMAX) 250 MG tablet Take 1 tablet (250 mg total) by mouth daily. Patient not taking: Reported on 12/31/2014 12/02/14   Thurnell Lose, MD  doxycycline (VIBRAMYCIN) 100 MG capsule Take 1 capsule (100 mg total) by mouth 2 (two) times daily. Patient not taking: Reported on 12/31/2014 12/02/14   Thurnell Lose, MD  folic acid (FOLVITE) 1 MG tablet Take 1 tablet (1 mg total) by mouth daily. Patient not taking: Reported on 12/31/2014 12/02/14   Thurnell Lose, MD  montelukast (SINGULAIR) 10 MG tablet Take 1 tablet (10 mg total) by mouth at bedtime. Patient not taking: Reported on 12/31/2014 06/18/14   Bonnielee Haff, MD  predniSONE (DELTASONE) 5 MG tablet Label  & dispense according to the schedule below. 10 Pills PO for 3 days then, 8 Pills PO for 3 days, 6 Pills PO for 3 days, 4 Pills PO for 3 days, 2 Pills PO for 3 days, 1 Pills PO for 3 days, 1/2 Pill  PO for 3 days then STOP. Total 95 pills. Patient not taking: Reported on 12/31/2014 12/02/14   Thurnell Lose, MD  thiamine 100 MG tablet Take 1 tablet (100 mg total) by mouth daily. Patient not taking: Reported on 12/31/2014 12/02/14   Thurnell Lose, MD   Family History  Family History  Problem Relation Age of Onset  . Asthma Mother   . Allergies Mother   . Allergies Sister   . Allergies Brother   . Deep vein thrombosis Brother       two brothers with recurrent DVT  . Heart attack Father     a.60s b. deceased in his 36s  . Coronary artery disease Father    Social History  Social History   Social History  . Marital Status: Single    Spouse Name: N/A  . Number of Children: N/A  . Years of Education: N/A   Occupational History  . unemployed    Social History Main Topics  . Smoking status: Current Every Day Smoker --  0.25 packs/day for 38 years    Types: Cigarettes  . Smokeless tobacco: Never Used  . Alcohol Use: 3.6 oz/week    6 Cans of beer per week     Comment: 01/26/2014 "drink  2 40oz beers weekly"  . Drug Use: Yes    Special: Cocaine     Comment: a. last use was 8/16 - cocaine  . Sexual Activity: Yes   Other Topics Concern  . Not on file   Social History Narrative   Lives in [place].    Review of Systems General:  No chills, fever, night sweats or weight changes.  Cardiovascular:  Reports chest pain, No dyspnea on exertion, edema, orthopnea, palpitations, paroxysmal nocturnal dyspnea. Dermatological: No rash, lesions/masses Respiratory: No cough, dyspnea Urologic: No hematuria, dysuria Abdominal:   No nausea, vomiting, diarrhea, bright red blood per rectum, melena, or hematemesis Neurologic:  No visual changes, wkns, changes in mental status. All other systems reviewed and are otherwise negative except as noted above.  Physical Exam  Blood pressure 124/81, pulse 65, temperature 98.3 F (36.8 C), temperature source Oral, resp. rate 18, height 6\' 2"  (1.88 m), weight 175 lb (79.379 kg), SpO2 99 %.  General: Pleasant, NAD Psych: Normal affect. Neuro: Alert and oriented X 3. Moves all extremities spontaneously. HEENT: Normal  Neck: Supple without bruits or JVD. Lungs:  Resp regular and unlabored, scattered rhonchi with insp/exp wheezing throughout. Heart: RRR no s3, s4, or murmurs.  Distant heart sounds. Abdomen: Soft, non-tender, non-distended, BS + x 4.  Extremities: No clubbing, cyanosis  or edema. DP/PT/Radials 2+ and equal bilaterally.  Labs  Troponin [facility] Test)  Recent Labs  01/01/15 0027  TROPIPOC 0.00   Recent Labs  01/01/15 0208 01/01/15 0729  TROPONINI <0.03 <0.03   Lab Results  Component Value Date   WBC 3.1* 01/01/2015   HGB 10.7* 01/01/2015   HCT 32.3* 01/01/2015   MCV 95.3 01/01/2015   PLT 104* 01/01/2015     Recent Labs Lab 01/01/15 0527  NA 138  K 3.1*  CL 105  CO2 22  BUN 7  CREATININE 1.06  CALCIUM 8.0*  PROT 4.9*  BILITOT 0.6  ALKPHOS 67  ALT 30  AST 51*  GLUCOSE 188*   Lab Results  Component Value Date   CHOL 190 01/01/2015   HDL 62 01/01/2015   LDLCALC 116* 01/01/2015   TRIG 60 01/01/2015   Lab Results  Component Value Date   DDIMER 1.11* 12/01/2014     Radiology/Studies  Dg Chest 2 View  12/31/2014   CLINICAL DATA:  Acute onset of chest tightness and diaphoresis with hypotension  EXAM: CHEST - 2 VIEW  COMPARISON:  12/01/2014  FINDINGS: Cardiac shadow is within normal limits. The lungs are clear bilaterally. No acute infiltrate or effusion is seen. No bony abnormality is noted.  IMPRESSION: No active disease.   Electronically Signed   By: Inez Catalina M.D.   On: 12/31/2014 21:47   Ct Angio Chest Pe W/cm &/or Wo Cm  12/31/2014   CLINICAL DATA:  Mid the left chest pain today. Some short of breath. History of DVT and pulmonary embolism.  EXAM: CT ANGIOGRAPHY CHEST WITH CONTRAST  TECHNIQUE: Multidetector CT imaging of the chest was performed using the standard protocol during bolus administration of intravenous contrast. Multiplanar CT image reconstructions and MIPs were obtained to evaluate the vascular anatomy.  CONTRAST:  29mL OMNIPAQUE IOHEXOL 350 MG/ML SOLN  COMPARISON:  12/02/2014  FINDINGS: No filling defects are  present in the pulmonary arterial tree to suggest acute pulmonary thromboembolism.  No abnormal mediastinal adenopathy. Coronary artery calcifications are present.  No pneumothorax.  No pleural  effusion  Lungs are clear. There is mucous filling of left upper lobe segmental airways.  Simple cyst in the left kidney.  No vertebral compression deformity.  Review of the MIP images confirms the above findings.  IMPRESSION: No evidence of acute pulmonary thromboembolism  Mucoid filling of left upper lobe segmental airways suggesting an inflammatory process of the bronchials.   Electronically Signed   By: Marybelle Killings M.D.   On: 12/31/2014 23:44   US Renal  01/01/2015   CLINICAL DATA:  Acute kidney injury  EXAM: RENAL / URINARY TRACT ULTRASOUND COMPLETE  COMPARISON:  None.  FINDINGS: Right Kidney:  Length: 9.2 cm. Echogenicity within normal limits. No mass or hydronephrosis visualized.  Left Kidney:  Length: 10.2 cm. Echogenicity within normal limits. No mass or hydronephrosis visualized.  Bladder:  Appears normal for degree of bladder distention.  IMPRESSION: Negative for hydronephrosis.   Electronically Signed   By: Andreas Newport M.D.   On: 01/01/2015 02:41    ECG  SR, rate 86, LAE, mildly prolonged QTc.  ASSESSMENT AND PLAN  1. Chest pain/CAD- Pt with a h/o CAD s/p BMS to the [place] in 09/2013 in the setting of NSTEMI.  he presented on the evening of 10/6 with left sided chest dicomfort with typical (similar to prior angina but not as severe) and atypical (worse with deep breathing and palpation) features.  ECG non-acute.  Trop neg so far.  CTA neg for PE.   -Agree with admission and continued cycling of cardiac markers.  Provided that these remain negative, would pursue an inpatient lexiscan cardiolite to r/o ischemia.   -Would ideally be on a statin however Hep C diagnosis complicates this.  2. Hx of DVT/PE- Pt s/p IVC filter in 2012 and is supposed to be on eliquis.  Per d/c summary in September, he was d/c'd on 2.5 bid - prophylactic dosing. Pt noncompliant for the past 2 weeks with Eliquis -CT angio of chest was negative for a PE in the presence of elevated D-dimer. -Continue with  heparin therapy  -Consider starting pt back on Eliquis once he's ruled out and it's clear that he will not need invasive procedures.  3.  Chronic Diastolic Congestive HF- Previous echo on 11/15 showed 55-60% with grade 1 diastolic dysfunction. Pt does not exhibit signs/symptoms of fluid over load. - HR/BP stable.  4.HTN- Pt not currently complaint with home medications. On nitro drip at this time, with blood pressure stable. -Assess blood pressure control after cessation of nitroglycerin drip.  5. Hypokalemia- Morning CMP shows potassium of 3.1 -Supp.  6. Tobacco Abuse- History of 38 year 0.25 pack/day smoker. -Strongly encouraged smoking cessation, and risks involved if smoking continued.   7.  ETOH/Cocaine Abuse-  Drinks a forty and a few shots on the weekends.  Last used cocaine in the past month.  Complete cessation advised.  8. Hepatitis C- pending ID eval.  Not currently scheduled.  Signed, Murray Hodgkins, NP 01/01/2015, 10:53 AM As above, patient seen and examined. Briefly he is a 54 year old male with past medical history of prior DVT/pulmonary embolus, prior IVC filter, coronary artery disease, COPD, hypertension and medication noncompliance for evaluation of chest pain. Patient developed chest pain at approximately 7 PM last evening. It was in the left chest area radiating to the right. There was diaphoresis but no dyspnea  or nausea. The pain did increase with inspiration. The pain persisted until approximate 6 AM today. Presently pain free. Electrocardiogram with no ST changes. Initial enzymes negative. CT shows no pulmonary embolus. Drug screen positive for cocaine.  Symptoms are atypical. Plan to rule out myocardial infarction with serial enzymes. If negative plan nuclear study for risk stratification. Patient instructed on the importance of avoiding cocaine and tobacco. Note patient's hemoglobin has decreased from 14.2 on admission to 10.7. No active bleeding noted. Further  evaluation per primary care. Question lab error. Would repeat. If hemoglobin is stable on follow-up CBC and and there is no evidence of bleeding (and it is clear no invasive testing needed) would plan to resume apixaban at previous dose following discharge. Kirk Ruths

## 2015-01-01 NOTE — Evaluation (Signed)
Physical Therapy Evaluation Patient Details Name: [name] MRN: [id number] DOB: April 23, 1960 Today's Date: 01/01/2015   History of Present Illness  Patient is a 54 yo male admitted 12/31/14 with chest pain.   PMH of DVT, PE, s/p of vena cava filter placement 2012, CAD, S/P NSTEMI and stent placement 2015, COPD, asthma, diastolic congestive heart failure, hypertension, medication noncompliance (not take all medications for 2 weeks), and substance abuse.    Clinical Impression  Patient is functioning at Mod I level with all mobility and gait.  Balance is good during gait.  No acute PT needs identified - PT will sign off.  Recommend Inpatient Cardiac Rehab Program-Phase I while in hospital - RN notified.    Follow Up Recommendations No PT follow up;Supervision - Intermittent    Equipment Recommendations  None recommended by PT    Recommendations for Other Services Other (comment) (Inpatient Cardiac Rehab Program-Phase I)     Precautions / Restrictions Precautions Precautions: None Restrictions Weight Bearing Restrictions: No      Mobility  Bed Mobility Overal bed mobility: Independent                Transfers Overall transfer level: Independent Equipment used: None                Ambulation/Gait Ambulation/Gait assistance: Modified independent (Device/Increase time) Ambulation Distance (Feet): 30 Feet Assistive device: None Gait Pattern/deviations: Step-through pattern;Decreased stride length Gait velocity: decreased Gait velocity interpretation: Below normal speed for age/gender General Gait Details: Patient with slow steady gait.  No loss of balance.  Stairs            Wheelchair Mobility    Modified Rankin (Stroke Patients Only)       Balance Overall balance assessment: Modified Independent                       [place]berg - Eyes Opened: 30 [place] - Eyes Closed: 30                 Pertinent Vitals/Pain Pain  Assessment: 0-10 Pain Score: 8  Pain Location: Head Pain Descriptors / Indicators: Headache Pain Intervention(s): Patient requesting pain meds-RN notified;Limited activity within patient's tolerance    Home Living Family/patient expects to be discharged to:: Private residence Living Arrangements: Spouse/significant other;Other (Comment) (Girlfriend's mother and daughter) Available Help at Discharge: Family;Available 24 hours/day Type of Home: House Home Access: Stairs to enter Entrance Stairs-Rails: None Entrance Stairs-Number of Steps: 3 Home Layout: One level Home Equipment: None      Prior Function Level of Independence: Independent         Comments: Reports he does get SOB with activity at times.     Hand Dominance        Extremity/Trunk Assessment   Upper Extremity Assessment: Overall WFL for tasks assessed           Lower Extremity Assessment: Overall WFL for tasks assessed      Cervical / Trunk Assessment: Normal  Communication   Communication: No difficulties  Cognition Arousal/Alertness: Awake/alert Behavior During Therapy: WFL for tasks assessed/performed Overall Cognitive Status: Within Functional Limits for tasks assessed                      General Comments      Exercises        Assessment/Plan    PT Assessment Patent does not need any further PT services  PT Diagnosis Acute pain;Abnormality of gait  PT Problem List    PT Treatment Interventions     PT Goals (Current goals can be found in the Care Plan section) Acute Rehab PT Goals PT Goal Formulation: All assessment and education complete, DC therapy    Frequency     Barriers to discharge        Co-evaluation               End of Session   Activity Tolerance: Patient tolerated treatment well;Patient limited by fatigue;Patient limited by pain Patient left: in bed;with call bell/phone within reach Nurse Communication: Mobility status (No PT needs.  Recommend  Inpt Cardiac Rehab)         Time: 3383-2919 PT Time Calculation (min) (ACUTE ONLY): 15 min   Charges:   PT Evaluation $Initial PT Evaluation Tier I: 1 Procedure     PT G CodesDespina Pole Jan 02, 2015, 6:31 PM Carita Pian. Sanjuana Kava, [place] Pager [phone]

## 2015-01-02 ENCOUNTER — Inpatient Hospital Stay (HOSPITAL_COMMUNITY): Payer: Medicare Other

## 2015-01-02 DIAGNOSIS — F1410 Cocaine abuse, uncomplicated: Secondary | ICD-10-CM

## 2015-01-02 DIAGNOSIS — R079 Chest pain, unspecified: Secondary | ICD-10-CM

## 2015-01-02 DIAGNOSIS — I5032 Chronic diastolic (congestive) heart failure: Secondary | ICD-10-CM

## 2015-01-02 DIAGNOSIS — Z720 Tobacco use: Secondary | ICD-10-CM

## 2015-01-02 DIAGNOSIS — Z9119 Patient's noncompliance with other medical treatment and regimen: Secondary | ICD-10-CM

## 2015-01-02 DIAGNOSIS — I25110 Atherosclerotic heart disease of native coronary artery with unstable angina pectoris: Secondary | ICD-10-CM | POA: Diagnosis not present

## 2015-01-02 LAB — HEPARIN LEVEL (UNFRACTIONATED)
Heparin Unfractionated: 0.57 IU/mL (ref 0.30–0.70)
Heparin Unfractionated: 0.62 IU/mL (ref 0.30–0.70)

## 2015-01-02 LAB — NM MYOCAR MULTI W/SPECT W/WALL MOTION / EF
CHL CUP MPHR: 166 {beats}/min
CSEPEW: 1 METS
CSEPHR: 57 %
Peak HR: 95 {beats}/min
Rest HR: 58 {beats}/min

## 2015-01-02 LAB — CBC
HCT: 32.1 % — ABNORMAL LOW (ref 39.0–52.0)
Hemoglobin: 10.7 g/dL — ABNORMAL LOW (ref 13.0–17.0)
MCH: 32.1 pg (ref 26.0–34.0)
MCHC: 33.3 g/dL (ref 30.0–36.0)
MCV: 96.4 fL (ref 78.0–100.0)
PLATELETS: 84 10*3/uL — AB (ref 150–400)
RBC: 3.33 MIL/uL — AB (ref 4.22–5.81)
RDW: 15.1 % (ref 11.5–15.5)
WBC: 2.2 10*3/uL — ABNORMAL LOW (ref 4.0–10.5)

## 2015-01-02 LAB — MAGNESIUM: MAGNESIUM: 2.2 mg/dL (ref 1.7–2.4)

## 2015-01-02 LAB — UREA NITROGEN, URINE: Urea Nitrogen, Ur: 246 mg/dL

## 2015-01-02 MED ORDER — TECHNETIUM TC 99M SESTAMIBI GENERIC - CARDIOLITE
30.0000 | Freq: Once | INTRAVENOUS | Status: AC | PRN
  Administered 2015-01-02: 30 via INTRAVENOUS

## 2015-01-02 MED ORDER — REGADENOSON 0.4 MG/5ML IV SOLN
INTRAVENOUS | Status: AC
  Administered 2015-01-02: 0.4 mg via INTRAVENOUS
  Filled 2015-01-02: qty 5

## 2015-01-02 MED ORDER — TECHNETIUM TC 99M SESTAMIBI GENERIC - CARDIOLITE
10.0000 | Freq: Once | INTRAVENOUS | Status: AC | PRN
  Administered 2015-01-02: 10 via INTRAVENOUS

## 2015-01-02 NOTE — Progress Notes (Addendum)
ANTICOAGULATION CONSULT NOTE - Follow-up  Pharmacy Consult for heparin Indication: chest pain/ACS  No Known Allergies  Patient Measurements: Height: 6\' 2"  (188 cm) Weight: 167 lb (75.751 kg) IBW/kg (Calculated) : 82.2 Heparin Dosing Weight: 79kg  Vital Signs: Temp: 98.2 F (36.8 C) (10/07 2113) Temp Source: Oral (10/07 2113) BP: 132/83 mmHg (10/07 2113) Pulse Rate: 66 (10/07 2113)  Labs:  Recent Labs  12/31/14 2102  01/01/15 0208 01/01/15 0527 01/01/15 0528 01/01/15 0729 01/01/15 0940 01/01/15 1343 01/01/15 1604 01/02/15 0305  HGB 14.2  --   --   --  10.7*  --   --   --  11.2* 10.7*  HCT 42.7  --   --   --  32.3*  --   --   --  34.0* 32.1*  PLT 126*  --   --   --  104*  --   --   --  95* 84*  APTT  --   --  23*  --   --   --   --   --   --   --   LABPROT  --   --  13.8  --   --   --   --   --   --   --   INR  --   --  1.04  --   --   --   --   --   --   --   HEPARINUNFRC  --   < > <0.10*  --   --   --  0.57  --  0.71* 0.57  CREATININE 1.27*  --   --  1.06  --   --   --   --   --   --   TROPONINI  --   --  <0.03  --   --  <0.03  --  <0.03  --   --   < > = values in this interval not displayed.  Estimated Creatinine Clearance: 85.4 mL/min (by C-G formula based on Cr of 1.06).  Medications:  Infusions:  . sodium chloride 75 mL/hr at 01/01/15 1800  . heparin 1,000 Units/hr (01/01/15 2141)  . nitroGLYCERIN 5 mcg/min (01/01/15 2145)    Assessment: 3 yom presented to the ED with CP and near syncope. Started on IV heparin for anticoagulation. Also he was on apixaban PTA for hx of PE and DVT but he has been non-compliant with this for several week. Heparin level is now therapeutic. No bleeding noted.   Goal of Therapy:  Heparin level 0.3-0.7 units/ml Monitor platelets by anticoagulation protocol: Yes   Plan:  - Continue heparin gtt 1000 units/hr - Check a 6 hour heparin level - Daily heparin level and CBC  Salome Arnt, PharmD, BCPS Pager # [phone]  01/02/2015 3:43 AM

## 2015-01-02 NOTE — Progress Notes (Signed)
ANTICOAGULATION CONSULT NOTE - Follow-up  Pharmacy Consult for heparin Indication: chest pain/ACS  No Known Allergies  Patient Measurements: Height: 6\' 2"  (188 cm) Weight: 168 lb 14.4 oz (76.613 kg) IBW/kg (Calculated) : 82.2 Heparin Dosing Weight: 79kg  Vital Signs: Temp: 98.2 F (36.8 C) (10/08 0447) Temp Source: Oral (10/08 0447) BP: 130/81 mmHg (10/08 0447) Pulse Rate: 65 (10/08 0447)  Labs:  Recent Labs  12/31/14 2102  01/01/15 0208 01/01/15 0527 01/01/15 0528 01/01/15 0729 01/01/15 0940 01/01/15 1343 01/01/15 1604 01/02/15 0305  HGB 14.2  --   --   --  10.7*  --   --   --  11.2* 10.7*  HCT 42.7  --   --   --  32.3*  --   --   --  34.0* 32.1*  PLT 126*  --   --   --  104*  --   --   --  95* 84*  APTT  --   --  23*  --   --   --   --   --   --   --   LABPROT  --   --  13.8  --   --   --   --   --   --   --   INR  --   --  1.04  --   --   --   --   --   --   --   HEPARINUNFRC  --   < > <0.10*  --   --   --  0.57  --  0.71* 0.57  CREATININE 1.27*  --   --  1.06  --   --   --   --   --   --   TROPONINI  --   --  <0.03  --   --  <0.03  --  <0.03  --   --   < > = values in this interval not displayed.  Estimated Creatinine Clearance: 86.3 mL/min (by C-G formula based on Cr of 1.06).  Medications:  Infusions:  . sodium chloride 75 mL/hr at 01/01/15 1800  . heparin 1,000 Units/hr (01/01/15 2141)  . nitroGLYCERIN 5 mcg/min (01/01/15 2145)    Assessment: 42 yom presented to the ED with CP and near syncope, UDS + cocaine. Started on IV heparin for anticoagulation. He was on apixaban PTA for hx of PE and DVT, but he has been non-compliant with this for several weeks. Heparin level remains therapeutic at 0.62. No bleeding noted.   Goal of Therapy:  Heparin level 0.3-0.7 units/ml Monitor platelets by anticoagulation protocol: Yes   Plan:  - Continue heparin gtt at 1000 units/hr - Daily heparin level and CBC  Governor Specking, PharmD Clinical Pharmacy  Resident Pager: [phone]  01/02/2015 8:18 AM

## 2015-01-02 NOTE — Progress Notes (Signed)
Echocardiogram 2D Echocardiogram has been performed.  [name] 01/02/2015, 11:07 AM

## 2015-01-02 NOTE — Progress Notes (Addendum)
Patient Profile: 54 y.o. male with PMH of DVT, PE, s/p of vena cava filter placement 2012, CAD, S/P of stent placement of the LAD 2015, COPD, asthma, diastolic congestive heart failure, hypertension, medication noncompliance who presented to the ED with left sided chest pain. Electrocardiogram with no ST changes. Initial enzymes negative. CT shows no pulmonary embolus. Drug screen positive for cocaine.  Subjective: Currently CP free.   Objective: Vital signs in last 24 hours: Temp:  [98.2 F (36.8 C)] 98.2 F (36.8 C) (10/08 0447) Pulse Rate:  [54-90] 77 (10/08 1132) BP: (130-180)/(81-109) 156/95 mmHg (10/08 1132) SpO2:  [100 %] 100 % (10/08 0447) Weight:  [168 lb 14.4 oz (76.613 kg)] 168 lb 14.4 oz (76.613 kg) (10/08 0447) Last BM Date: 01/01/15  Intake/Output from previous day: 10/07 0701 - 10/08 0700 In: 2856.7 [P.O.:620; I.V.:2130.7; IV Piggyback:106] Out: -  Intake/Output this shift: Total I/O In: -  Out: 400 [Urine:400]  Medications Current Facility-Administered Medications  Medication Dose Route Frequency Provider Last Rate Last Dose  . 0.9 %  sodium chloride infusion   Intravenous Continuous Ivor Costa, MD 75 mL/hr at 01/02/15 1131 75 mL/hr at 01/02/15 1131  . acetaminophen (TYLENOL) tablet 650 mg  650 mg Oral Q6H PRN Ivor Costa, MD   650 mg at 01/01/15 1714   Or  . acetaminophen (TYLENOL) suppository 650 mg  650 mg Rectal Q6H PRN Ivor Costa, MD      . albuterol (PROVENTIL) (2.5 MG/3ML) 0.083% nebulizer solution 2.5 mg  2.5 mg Nebulization Q6H PRN Ivor Costa, MD      . alum & mag hydroxide-simeth (MAALOX/MYLANTA) 200-200-20 MG/5ML suspension 30 mL  30 mL Oral Q6H PRN Ivor Costa, MD      . diphenhydrAMINE (BENADRYL) capsule 25 mg  25 mg Oral Daily PRN Ivor Costa, MD      . heparin ADULT infusion 100 units/mL (25000 units/250 mL)  1,000 Units/hr Intravenous Continuous Eugenie Filler, MD 10 mL/hr at 01/01/15 2141 1,000 Units/hr at 01/01/15 2141  . mometasone-formoterol  (DULERA) 200-5 MCG/ACT inhaler 2 puff  2 puff Inhalation BID Ivor Costa, MD   2 puff at 01/02/15 1322  . morphine 2 MG/ML injection 2 mg  2 mg Intravenous Q3H PRN Ivor Costa, MD      . nicotine (NICODERM CQ - dosed in mg/24 hours) patch 21 mg  21 mg Transdermal Daily Ivor Costa, MD   21 mg at 01/01/15 1019  . nitroGLYCERIN (NITROSTAT) SL tablet 0.4 mg  0.4 mg Sublingual Q5 min PRN Roberto Scales, MD   0.4 mg at 12/31/14 2330  . nitroGLYCERIN 50 mg in dextrose 5 % 250 mL (0.2 mg/mL) infusion  2-200 mcg/min Intravenous Titrated Ivor Costa, MD   Stopped at 01/02/15 1133  . sodium chloride 0.9 % injection 3 mL  3 mL Intravenous Q12H Ivor Costa, MD   3 mL at 01/01/15 2143  . tiotropium (SPIRIVA) inhalation capsule 18 mcg  18 mcg Inhalation Daily Ivor Costa, MD   18 mcg at 01/02/15 1322    PE: General appearance: alert, cooperative and no distress Neck: no carotid bruit and no JVD Lungs: clear to auscultation bilaterally Heart: regular rate and rhythm, S1, S2 normal, no murmur, click, rub or gallop Extremities: no LEE Pulses: 2+ and symmetric Skin: warm and dry Neurologic: Grossly normal  Lab Results:   Recent Labs  01/01/15 0528 01/01/15 1604 01/02/15 0305  WBC 3.1* 2.7* 2.2*  HGB 10.7* 11.2* 10.7*  HCT 32.3* 34.0* 32.1*  PLT 104* 95* 84*   BMET  Recent Labs  12/31/14 2102 01/01/15 0527  NA 140 138  K 3.6 3.1*  CL 104 105  CO2 23 22  GLUCOSE 139* 188*  BUN 8 7  CREATININE 1.27* 1.06  CALCIUM 9.2 8.0*   PT/INR  Recent Labs  01/01/15 0208  LABPROT 13.8  INR 1.04   Cholesterol  Recent Labs  01/01/15 0208  CHOL 190    Cardiac Panel (last 3 results)  Recent Labs  01/01/15 0208 01/01/15 0729 01/01/15 1343  TROPONINI <0.03 <0.03 <0.03    Studies/Results: NST - pending   Assessment/Plan    Principal Problem:   Chest pain Active Problems:   History of pulmonary embolism   History of noncompliance with medical treatment   Thrombocytopenia (HCC)   Tobacco  abuse   History of DVT (deep vein thrombosis)   Anemia   Coronary artery disease involving native coronary artery of native heart without angina pectoris   Cocaine abuse   Chronic diastolic heart failure (HCC)   QT prolongation   COPD (chronic obstructive pulmonary disease) (HCC)   AKI (acute kidney injury) ([place])   Essential hypertension   Left sided chest pain  1. Chest Pain: Currently CP free. Electrocardiogram with no ST changes. Cardiac enzymes negative x 3. CT shows no pulmonary embolus. Drug screen positive for cocaine. NST pending. If negative, no further cardiac w./u indicated. Avoidance of cocaine strongly advised.      LOS: 1 day    Brittainy M. Rosita Fire, PA-C 01/02/2015 2:01 PM  stressneg wil sign off call for questions

## 2015-01-02 NOTE — Progress Notes (Signed)
[facility] PROGRESS NOTE  Sandip Power ZJQ:[id number] DOB: [birth year] DOA: 12/31/2014 PCP: [name], MD  Assessment/Plan: #1 chest pain Patient with history of coronary artery disease status post bare metal stent and 7 2015 in the setting of non-STEMI presenting with left-sided chest discomfort with both typical and atypical features. EKG with no ischemic changes. Cardiac enzymes negative 3. CT angiogram chest negative for PE. 2-D echo has been done and is pending. Patient has had a Myoview stress test done today which is pending. Continue heparin drip. Per cardiology.  #2 history of DVT/PE Status post IVC filter 2012. Supposed to be on eliquis, however patient has been noncompliant with his medications over the past 2 weeks. CT angiogram chest negative. Patient on IV heparin. Once workup is complete and patient has ruled out and no need for any further procedures will transition back to eliquis.  #3 chronic diastolic heart failure Stable. 2-D echo pending. Patient is compensated. Follow.  #4 hypertension Patient on nitro drip. BP stable.  #5 hypokalemia Replete.  #6 polysubstance abuse/alcohol use Patient states he joints alcohol on the weekends. Tobacco cessation as well as polysubstance abuse cessation has been stressed to the patient.  #7 hepatitis C Will need to follow-up with ID in the outpatient setting. Will hold off on statin for now.  #8 tobacco abuse Tobacco cessation.  #9 prophylaxis Heparin for DVT prophylaxis.   Code Status: Full Family Communication: Updated patient. No family at bedside. Disposition Plan: Pending Myoview stress testing per cardiology.   Consultants:  Cardiology: Dr. Stanford Breed 01/01/2015  Procedures:  CT chest 12/31/2014  Chest x-ray 12/31/2014  2-D echo 01/02/2015  Myoview stress test 01/02/2015.  Antibiotics:  None  HPI/Subjective: Patient denies any chest pain. No shortness of breath.  Objective: Filed  Vitals:   01/02/15 1132  BP: 156/95  Pulse: 77  Temp:   Resp:     Intake/Output Summary (Last 24 hours) at 01/02/15 1441 Last data filed at 01/02/15 1334  Gross per 24 hour  Intake 2856.65 ml  Output    400 ml  Net 2456.65 ml   Filed Weights   12/31/14 2036 01/01/15 1246 01/02/15 0447  Weight: 79.379 kg (175 lb) 75.751 kg (167 lb) 76.613 kg (168 lb 14.4 oz)    Exam:   General:  NAD  Cardiovascular: RRR  Respiratory: CTAB  Abdomen: Soft, nontender, nondistended, positive bowel sounds.  Musculoskeletal: No clubbing cyanosis or edema.  Data Reviewed: Basic Metabolic Panel:  Recent Labs Lab 12/31/14 2102 01/01/15 0527 01/01/15 0940 01/02/15 0305  NA 140 138  --   --   K 3.6 3.1*  --   --   CL 104 105  --   --   CO2 23 22  --   --   GLUCOSE 139* 188*  --   --   BUN 8 7  --   --   CREATININE 1.27* 1.06  --   --   CALCIUM 9.2 8.0*  --   --   MG  --   --  1.6* 2.2   Liver Function Tests:  Recent Labs Lab 01/01/15 0527  AST 51*  ALT 30  ALKPHOS 67  BILITOT 0.6  PROT 4.9*  ALBUMIN 2.6*   No results for input(s): LIPASE, AMYLASE in the last 168 hours. No results for input(s): AMMONIA in the last 168 hours. CBC:  Recent Labs Lab 12/31/14 2102 01/01/15 0528 01/01/15 1604 01/02/15 0305  WBC 3.7* 3.1* 2.7* 2.2*  HGB 14.2 10.7* 11.2*  10.7*  HCT 42.7 32.3* 34.0* 32.1*  MCV 96.0 95.3 96.3 96.4  PLT 126* 104* 95* 84*   Cardiac Enzymes:  Recent Labs Lab 01/01/15 0208 01/01/15 0729 01/01/15 1343  TROPONINI <0.03 <0.03 <0.03   BNP (last 3 results)  Recent Labs  03/25/14 1837 12/01/14 0311 01/01/15 0528  BNP 30.4 137.2* 87.1    ProBNP (last 3 results)  Recent Labs  01/26/14 0958  PROBNP 1619.0*    CBG:  Recent Labs Lab 01/01/15 0733  GLUCAP 127*    No results found for this or any previous visit (from the past 240 hour(s)).   Studies: Dg Chest 2 View  12/31/2014   CLINICAL DATA:  Acute onset of chest tightness and  diaphoresis with hypotension  EXAM: CHEST - 2 VIEW  COMPARISON:  12/01/2014  FINDINGS: Cardiac shadow is within normal limits. The lungs are clear bilaterally. No acute infiltrate or effusion is seen. No bony abnormality is noted.  IMPRESSION: No active disease.   Electronically Signed   By: Inez Catalina M.D.   On: 12/31/2014 21:47   Ct Angio Chest Pe W/cm &/or Wo Cm  12/31/2014   CLINICAL DATA:  Mid the left chest pain today. Some short of breath. History of DVT and pulmonary embolism.  EXAM: CT ANGIOGRAPHY CHEST WITH CONTRAST  TECHNIQUE: Multidetector CT imaging of the chest was performed using the standard protocol during bolus administration of intravenous contrast. Multiplanar CT image reconstructions and MIPs were obtained to evaluate the vascular anatomy.  CONTRAST:  83mL OMNIPAQUE IOHEXOL 350 MG/ML SOLN  COMPARISON:  12/02/2014  FINDINGS: No filling defects are present in the pulmonary arterial tree to suggest acute pulmonary thromboembolism.  No abnormal mediastinal adenopathy. Coronary artery calcifications are present.  No pneumothorax.  No pleural effusion  Lungs are clear. There is mucous filling of left upper lobe segmental airways.  Simple cyst in the left kidney.  No vertebral compression deformity.  Review of the MIP images confirms the above findings.  IMPRESSION: No evidence of acute pulmonary thromboembolism  Mucoid filling of left upper lobe segmental airways suggesting an inflammatory process of the bronchials.   Electronically Signed   By: Marybelle Killings M.D.   On: 12/31/2014 23:44   US Renal  01/01/2015   CLINICAL DATA:  Acute kidney injury  EXAM: RENAL / URINARY TRACT ULTRASOUND COMPLETE  COMPARISON:  None.  FINDINGS: Right Kidney:  Length: 9.2 cm. Echogenicity within normal limits. No mass or hydronephrosis visualized.  Left Kidney:  Length: 10.2 cm. Echogenicity within normal limits. No mass or hydronephrosis visualized.  Bladder:  Appears normal for degree of bladder distention.   IMPRESSION: Negative for hydronephrosis.   Electronically Signed   By: Andreas Newport M.D.   On: 01/01/2015 02:41    Scheduled Meds: . mometasone-formoterol  2 puff Inhalation BID  . nicotine  21 mg Transdermal Daily  . sodium chloride  3 mL Intravenous Q12H  . tiotropium  18 mcg Inhalation Daily   Continuous Infusions: . sodium chloride 75 mL/hr (01/02/15 1131)  . heparin 1,000 Units/hr (01/01/15 2141)  . nitroGLYCERIN Stopped (01/02/15 1133)    Principal Problem:   Chest pain Active Problems:   History of pulmonary embolism   History of noncompliance with medical treatment   Thrombocytopenia (HCC)   Tobacco abuse   History of DVT (deep vein thrombosis)   Anemia   Coronary artery disease involving native coronary artery of native heart without angina pectoris   Hepatitis C   Cocaine abuse  Chronic diastolic heart failure (HCC)   QT prolongation   COPD (chronic obstructive pulmonary disease) (HCC)   AKI (acute kidney injury) ([place])   Essential hypertension   Left sided chest pain    Time spent: 32 minutes    THOMPSON,DANIEL M.D. [facility] Pager [phone]. If 7PM-7AM, please contact night-coverage at [email or web address], password [facility] 01/02/2015, 2:41 PM  LOS: 1 day

## 2015-01-02 NOTE — Progress Notes (Signed)
OT Cancellation Note  Patient Details Name: Ryota Treece MRN: [id number] DOB: 07-27-1960   Cancelled Treatment:    Reason Eval/Treat Not Completed: Patient at procedure or test/ unavailable  Benito Mccreedy  OTR/L [phone]  01/02/2015, 11:41 AM

## 2015-01-03 DIAGNOSIS — D696 Thrombocytopenia, unspecified: Secondary | ICD-10-CM

## 2015-01-03 DIAGNOSIS — I200 Unstable angina: Secondary | ICD-10-CM

## 2015-01-03 DIAGNOSIS — B1920 Unspecified viral hepatitis C without hepatic coma: Secondary | ICD-10-CM

## 2015-01-03 DIAGNOSIS — R079 Chest pain, unspecified: Secondary | ICD-10-CM | POA: Diagnosis present

## 2015-01-03 DIAGNOSIS — R931 Abnormal findings on diagnostic imaging of heart and coronary circulation: Secondary | ICD-10-CM

## 2015-01-03 DIAGNOSIS — I2510 Atherosclerotic heart disease of native coronary artery without angina pectoris: Secondary | ICD-10-CM

## 2015-01-03 LAB — BASIC METABOLIC PANEL
Anion gap: 6 (ref 5–15)
BUN: 7 mg/dL (ref 6–20)
CALCIUM: 8.8 mg/dL — AB (ref 8.9–10.3)
CHLORIDE: 107 mmol/L (ref 101–111)
CO2: 24 mmol/L (ref 22–32)
CREATININE: 0.75 mg/dL (ref 0.61–1.24)
GFR calc non Af Amer: >60 mL/min (ref >60)
Glucose, Bld: 117 mg/dL — ABNORMAL HIGH (ref 65–99)
Potassium: 4.2 mmol/L (ref 3.5–5.1)
SODIUM: 137 mmol/L (ref 135–145)

## 2015-01-03 LAB — CBC
HCT: 32.2 % — ABNORMAL LOW (ref 39.0–52.0)
Hemoglobin: 10.7 g/dL — ABNORMAL LOW (ref 13.0–17.0)
MCH: 32.2 pg (ref 26.0–34.0)
MCHC: 33.2 g/dL (ref 30.0–36.0)
MCV: 97 fL (ref 78.0–100.0)
PLATELETS: 83 10*3/uL — AB (ref 150–400)
RBC: 3.32 MIL/uL — ABNORMAL LOW (ref 4.22–5.81)
RDW: 15.1 % (ref 11.5–15.5)
WBC: 2.4 10*3/uL — AB (ref 4.0–10.5)

## 2015-01-03 LAB — HEPARIN LEVEL (UNFRACTIONATED): Heparin Unfractionated: 0.34 IU/mL (ref 0.30–0.70)

## 2015-01-03 LAB — GLUCOSE, CAPILLARY: GLUCOSE-CAPILLARY: 94 mg/dL (ref 65–99)

## 2015-01-03 MED ORDER — ASPIRIN 81 MG PO CHEW
81.0000 mg | CHEWABLE_TABLET | ORAL | Status: AC
  Administered 2015-01-04: 81 mg via ORAL
  Filled 2015-01-03: qty 1

## 2015-01-03 MED ORDER — SODIUM CHLORIDE 0.9 % IJ SOLN
3.0000 mL | Freq: Two times a day (BID) | INTRAMUSCULAR | Status: DC
  Administered 2015-01-03: 3 mL via INTRAVENOUS

## 2015-01-03 MED ORDER — SODIUM CHLORIDE 0.9 % IV SOLN: 250.0000 mL | INTRAVENOUS | Status: DC | PRN

## 2015-01-03 MED ORDER — SODIUM CHLORIDE 0.9 % WEIGHT BASED INFUSION: 1.0000 mL/kg/h | INTRAVENOUS | Status: DC

## 2015-01-03 MED ORDER — SODIUM CHLORIDE 0.9 % IJ SOLN: 3.0000 mL | INTRAMUSCULAR | Status: DC | PRN

## 2015-01-03 MED ORDER — SODIUM CHLORIDE 0.9 % WEIGHT BASED INFUSION
3.0000 mL/kg/h | INTRAVENOUS | Status: DC
  Administered 2015-01-04: 3 mL/kg/h via INTRAVENOUS

## 2015-01-03 MED ORDER — HEPARIN (PORCINE) IN NACL 100-0.45 UNIT/ML-% IJ SOLN
1000.0000 [IU]/h | INTRAMUSCULAR | Status: DC
  Filled 2015-01-03: qty 250

## 2015-01-03 NOTE — Progress Notes (Addendum)
DAILY PROGRESS NOTE  Subjective:  No further chest pain. Stress test yesterday was abnormal, suggestive of LAD territory ischemia. I personally reviewed the images and agree with this assessment. SDS 4 with abnormal TID ratio of 1.31. Symptoms sound typical for unstable angina.  Objective:  Temp:  [98.2 F (36.8 C)-98.6 F (37 C)] 98.2 F (36.8 C) (10/09 0436) Pulse Rate:  [54-90] 59 (10/09 0436) Resp:  [18] 18 (10/08 1500) BP: (134-180)/(78-109) 138/83 mmHg (10/09 0436) SpO2:  [99 %-100 %] 99 % (10/09 0833) Weight:  [168 lb 9.6 oz (76.476 kg)] 168 lb 9.6 oz (76.476 kg) (10/09 0436) Weight change: 1 lb 9.6 oz (0.726 kg)  Intake/Output from previous day: 10/08 0701 - 10/09 0700 In: 860 [P.O.:600; I.V.:260] Out: 1200 [Urine:1200]  Intake/Output from this shift:    Medications: Current Facility-Administered Medications  Medication Dose Route Frequency Provider Last Rate Last Dose  . acetaminophen (TYLENOL) tablet 650 mg  650 mg Oral Q6H PRN Ivor Costa, MD   650 mg at 01/01/15 1714   Or  . acetaminophen (TYLENOL) suppository 650 mg  650 mg Rectal Q6H PRN Ivor Costa, MD      . albuterol (PROVENTIL) (2.5 MG/3ML) 0.083% nebulizer solution 2.5 mg  2.5 mg Nebulization Q6H PRN Ivor Costa, MD      . alum & mag hydroxide-simeth (MAALOX/MYLANTA) 200-200-20 MG/5ML suspension 30 mL  30 mL Oral Q6H PRN Ivor Costa, MD      . diphenhydrAMINE (BENADRYL) capsule 25 mg  25 mg Oral Daily PRN Ivor Costa, MD      . heparin ADULT infusion 100 units/mL (25000 units/250 mL)  1,000 Units/hr Intravenous Continuous Pixie Casino, MD      . mometasone-formoterol [facility]) 200-5 MCG/ACT inhaler 2 puff  2 puff Inhalation BID Ivor Costa, MD   2 puff at 01/03/15 [phone]  . morphine 2 MG/ML injection 2 mg  2 mg Intravenous Q3H PRN Ivor Costa, MD      . nicotine (NICODERM CQ - dosed in mg/24 hours) patch 21 mg  21 mg Transdermal Daily Ivor Costa, MD   21 mg at 01/01/15 1019  . nitroGLYCERIN (NITROSTAT) SL tablet 0.4  mg  0.4 mg Sublingual Q5 min PRN Roberto Scales, MD   0.4 mg at 12/31/14 2330  . nitroGLYCERIN 50 mg in dextrose 5 % 250 mL (0.2 mg/mL) infusion  2-200 mcg/min Intravenous Titrated Ivor Costa, MD   Stopped at 01/02/15 1133  . sodium chloride 0.9 % injection 3 mL  3 mL Intravenous Q12H Ivor Costa, MD   3 mL at 01/02/15 1000  . tiotropium (SPIRIVA) inhalation capsule 18 mcg  18 mcg Inhalation Daily Ivor Costa, MD   18 mcg at 01/03/15 3888    Physical Exam: General appearance: alert and no distress Lungs: clear to auscultation bilaterally Heart: regular rate and rhythm, S1, S2 normal, no murmur, click, rub or gallop Extremities: extremities normal, atraumatic, no cyanosis or edema Neurologic: Grossly normal  Lab Results: Results for orders placed or performed during the hospital encounter of 12/31/14 (from the past 48 hour(s))  Troponin I (q 6hr x 3)     Status: None   Collection Time: 01/01/15  1:43 PM  Result Value Ref Range   Troponin I <0.03 <0.031 ng/mL    Comment:        NO INDICATION OF MYOCARDIAL INJURY.   Heparin level (unfractionated)     Status: Abnormal   Collection Time: 01/01/15  4:04 PM  Result Value Ref  Range   Heparin Unfractionated 0.71 (H) 0.30 - 0.70 IU/mL    Comment:        IF HEPARIN RESULTS ARE BELOW EXPECTED VALUES, AND PATIENT DOSAGE HAS BEEN CONFIRMED, SUGGEST FOLLOW UP TESTING OF ANTITHROMBIN III LEVELS.   CBC     Status: Abnormal   Collection Time: 01/01/15  4:04 PM  Result Value Ref Range   WBC 2.7 (L) 4.0 - 10.5 K/uL   RBC 3.53 (L) 4.22 - 5.81 MIL/uL   Hemoglobin 11.2 (L) 13.0 - 17.0 g/dL   HCT 34.0 (L) 39.0 - 52.0 %   MCV 96.3 78.0 - 100.0 fL   MCH 31.7 26.0 - 34.0 pg   MCHC 32.9 30.0 - 36.0 g/dL   RDW 14.9 11.5 - 15.5 %   Platelets 95 (L) 150 - 400 K/uL    Comment: CONSISTENT WITH PREVIOUS RESULT  Heparin level (unfractionated)     Status: None   Collection Time: 01/02/15  3:05 AM  Result Value Ref Range   Heparin Unfractionated 0.57 0.30 -  0.70 IU/mL    Comment:        IF HEPARIN RESULTS ARE BELOW EXPECTED VALUES, AND PATIENT DOSAGE HAS BEEN CONFIRMED, SUGGEST FOLLOW UP TESTING OF ANTITHROMBIN III LEVELS.   CBC     Status: Abnormal   Collection Time: 01/02/15  3:05 AM  Result Value Ref Range   WBC 2.2 (L) 4.0 - 10.5 K/uL   RBC 3.33 (L) 4.22 - 5.81 MIL/uL   Hemoglobin 10.7 (L) 13.0 - 17.0 g/dL   HCT 32.1 (L) 39.0 - 52.0 %   MCV 96.4 78.0 - 100.0 fL   MCH 32.1 26.0 - 34.0 pg   MCHC 33.3 30.0 - 36.0 g/dL   RDW 15.1 11.5 - 15.5 %   Platelets 84 (L) 150 - 400 K/uL    Comment: CONSISTENT WITH PREVIOUS RESULT  Magnesium     Status: None   Collection Time: 01/02/15  3:05 AM  Result Value Ref Range   Magnesium 2.2 1.7 - 2.4 mg/dL  Heparin level (unfractionated)     Status: None   Collection Time: 01/02/15  2:38 PM  Result Value Ref Range   Heparin Unfractionated 0.62 0.30 - 0.70 IU/mL    Comment:        IF HEPARIN RESULTS ARE BELOW EXPECTED VALUES, AND PATIENT DOSAGE HAS BEEN CONFIRMED, SUGGEST FOLLOW UP TESTING OF ANTITHROMBIN III LEVELS.   CBC     Status: Abnormal   Collection Time: 01/03/15  4:12 AM  Result Value Ref Range   WBC 2.4 (L) 4.0 - 10.5 K/uL   RBC 3.32 (L) 4.22 - 5.81 MIL/uL   Hemoglobin 10.7 (L) 13.0 - 17.0 g/dL   HCT 32.2 (L) 39.0 - 52.0 %   MCV 97.0 78.0 - 100.0 fL   MCH 32.2 26.0 - 34.0 pg   MCHC 33.2 30.0 - 36.0 g/dL   RDW 15.1 11.5 - 15.5 %   Platelets 83 (L) 150 - 400 K/uL    Comment: CONSISTENT WITH PREVIOUS RESULT  Heparin level (unfractionated)     Status: None   Collection Time: 01/03/15  4:12 AM  Result Value Ref Range   Heparin Unfractionated 0.34 0.30 - 0.70 IU/mL    Comment:        IF HEPARIN RESULTS ARE BELOW EXPECTED VALUES, AND PATIENT DOSAGE HAS BEEN CONFIRMED, SUGGEST FOLLOW UP TESTING OF ANTITHROMBIN III LEVELS.   Basic metabolic panel     Status: Abnormal   Collection  Time: 01/03/15  4:12 AM  Result Value Ref Range   Sodium 137 135 - 145 mmol/L   Potassium 4.2  3.5 - 5.1 mmol/L    Comment: DELTA CHECK NOTED   Chloride 107 101 - 111 mmol/L   CO2 24 22 - 32 mmol/L   Glucose, Bld 117 (H) 65 - 99 mg/dL   BUN 7 6 - 20 mg/dL   Creatinine, Ser 0.75 0.61 - 1.24 mg/dL   Calcium 8.8 (L) 8.9 - 10.3 mg/dL   GFR calc non Af Amer >60 >60 mL/min   GFR calc Af Amer >60 >60 mL/min    Comment: (NOTE) The eGFR has been calculated using the CKD EPI equation. This calculation has not been validated in all clinical situations. eGFR's persistently <60 mL/min signify possible Chronic Kidney Disease.    Anion gap 6 5 - 15  Glucose, capillary     Status: None   Collection Time: 01/03/15  8:50 AM  Result Value Ref Range   Glucose-Capillary 94 65 - 99 mg/dL    Imaging: Nm Myocar Multi W/spect W/wall Motion / Ef  01/02/2015   CLINICAL DATA:  Chest pain and history of hypertension and coronary artery disease with prior coronary stent placement.  EXAM: MYOCARDIAL IMAGING WITH SPECT (REST AND PHARMACOLOGIC-STRESS)  GATED LEFT VENTRICULAR WALL MOTION STUDY  LEFT VENTRICULAR EJECTION FRACTION  TECHNIQUE: Standard myocardial SPECT imaging was performed after resting intravenous injection of 10 mCi [id number]. Subsequently, intravenous infusion of Lexiscan was performed under the supervision of the Cardiology staff. At peak effect of the drug, 30 mCi [id number] was injected intravenously and standard myocardial SPECT imaging was performed. Quantitative gated imaging was also performed to evaluate left ventricular wall motion, and estimate left ventricular ejection fraction.  COMPARISON:  None.  FINDINGS: Perfusion: There is evidence of inducible ischemia involving the mid to distal aspects of the anterior wall, likely in the LAD territory. No fixed perfusion defects are identified.  Wall Motion: Normal left ventricular wall motion. No left ventricular dilation.  Left Ventricular Ejection Fraction: 50 %  End diastolic volume 12932l  End systolic volume 64 ml  IMPRESSION:  1. Evidence of inducible ischemia in the mid to distal aspect of the anterior wall. This is suggestive of ischemia in the LAD territory.  2. Normal left ventricular wall motion.  3. Left ventricular ejection fraction 50%  4. Intermediate-risk stress test findings*.  *2012 Appropriate Use Criteria for Coronary Revascularization Focused Update: J Am Coll Cardiol. 203557;32(2):025-427http://content.onairportbarriers.comspx?articleid=1201161   Electronically Signed   By: GlAletta Edouard.D.   On: 01/02/2015 15:40    Assessment:  1. Principal Problem: 2.   Chest pain 3. Active Problems: 4.   History of pulmonary embolism 5.   History of noncompliance with medical treatment 6.   Thrombocytopenia ([place]7.   Tobacco abuse 8.   History of DVT (deep vein thrombosis) 9.   Anemia 10.   Coronary artery disease involving native coronary artery of native heart without angina pectoris 11.   Hepatitis C 12.   Cocaine abuse 13.   Chronic diastolic heart failure ([facility] [place]14.   QT prolongation 15.   COPD (chronic obstructive pulmonary disease) ([place]16.   AKI (acute kidney injury) ([place]17.   Essential hypertension 18.   Left sided chest pain 19.   Plan:  1. Symptoms concerning for unstable angina. Myoview demonstrates reversible anterior ischemia, suggestive of possible mid-distal LAD disease, however TID ratio is elevated and it may be  more proximal. Currently chest pain free. Cardiac markers have been negative. He remains on heparin. I'm recommending a left heart catheterization. I discussed the risks/benefits of the procedure today and he is willing to proceed.  Will place cath orders. Ok for clear liquid diet tomorrow morning and then NPO after.  Time Spent Directly with Patient:  15 minutes  Length of Stay:  LOS: 2 days   Pixie Casino, MD, [facility] Attending Cardiologist [place] 01/03/2015, 10:57 AM

## 2015-01-03 NOTE — Progress Notes (Signed)
[facility] PROGRESS NOTE  [name] JGO:[id number] DOB: 1961/02/09 DOA: 12/31/2014 PCP: [name], MD  Assessment/Plan: #1 chest pain/Ustable angina Patient with history of coronary artery disease status post bare metal stent and 7/ 2015 in the setting of non-STEMI presenting with left-sided chest discomfort with both typical and atypical features. EKG with no ischemic changes. Cardiac enzymes negative 3. CT angiogram chest negative for PE. 2-D echo with EF of 55-60% with no wall motion abnormalities. Grade 1 diastolic dysfunction. Patient status post Myoview stress test that shows inducible ischemia in the mid to distal aspect of the anterior wall suggestive of ischemia in the LAD region. EF 50%. Normal left ventricular wall motion. Intermediate risk stress test findings. Continue heparin drip. Patient for cardiac catheterization tomorrow per cardiology. Cardiology following.  #2 history of DVT/PE Status post IVC filter 2012. Supposed to be on eliquis, however patient has been noncompliant with his medications over the past 2 weeks. CT angiogram chest negative. Patient on IV heparin. Once workup is complete and no need for any further procedures will transition back to eliquis.  #3 chronic diastolic heart failure Stable. Compensated. 2-D echo with EF of 55-60% with no wall motion abnormalities. Grade 1 diastolic dysfunction. Follow.  #4 hypertension Patient on nitro drip. BP stable.  #5 hypokalemia Repleted.  #6 polysubstance abuse/alcohol use Patient states he drinks alcohol on the weekends. Tobacco cessation as well as polysubstance abuse cessation has been stressed to the patient.  #7 hepatitis C Patient states he responds to call the ID clinic to set up an appointment for outpatient follow-up and further evaluation. Will hold off on statin for now.  #8 tobacco abuse Tobacco cessation.  #9 prophylaxis Heparin for DVT prophylaxis.   Code Status: Full Family  Communication: Updated patient. No family at bedside. Disposition Plan: Pending cardiac catherization per cardiology.   Consultants:  Cardiology: Dr. Stanford Breed 01/01/2015  Procedures:  CT chest 12/31/2014  Chest x-ray 12/31/2014  2-D echo 01/02/2015  Myoview stress test 01/02/2015.  Antibiotics:  None  HPI/Subjective: Patient states no CP. No SOB.  Objective: Filed Vitals:   01/03/15 0436  BP: 138/83  Pulse: 59  Temp: 98.2 F (36.8 C)  Resp:     Intake/Output Summary (Last 24 hours) at 01/03/15 1430 Last data filed at 01/03/15 1300  Gross per 24 hour  Intake    860 ml  Output   1700 ml  Net   -840 ml   Filed Weights   01/01/15 1246 01/02/15 0447 01/03/15 0436  Weight: 75.751 kg (167 lb) 76.613 kg (168 lb 14.4 oz) 76.476 kg (168 lb 9.6 oz)    Exam:   General:  NAD  Cardiovascular: RRR  Respiratory: CTAB  Abdomen: Soft, nontender, nondistended, positive bowel sounds.  Musculoskeletal: No clubbing cyanosis or edema.  Data Reviewed: Basic Metabolic Panel:  Recent Labs Lab 12/31/14 2102 01/01/15 0527 01/01/15 0940 01/02/15 0305 01/03/15 0412  NA 140 138  --   --  137  K 3.6 3.1*  --   --  4.2  CL 104 105  --   --  107  CO2 23 22  --   --  24  GLUCOSE 139* 188*  --   --  117*  BUN 8 7  --   --  7  CREATININE 1.27* 1.06  --   --  0.75  CALCIUM 9.2 8.0*  --   --  8.8*  MG  --   --  1.6* 2.2  --  Liver Function Tests:  Recent Labs Lab 01/01/15 0527  AST 51*  ALT 30  ALKPHOS 67  BILITOT 0.6  PROT 4.9*  ALBUMIN 2.6*   No results for input(s): LIPASE, AMYLASE in the last 168 hours. No results for input(s): AMMONIA in the last 168 hours. CBC:  Recent Labs Lab 12/31/14 2102 01/01/15 0528 01/01/15 1604 01/02/15 0305 01/03/15 0412  WBC 3.7* 3.1* 2.7* 2.2* 2.4*  HGB 14.2 10.7* 11.2* 10.7* 10.7*  HCT 42.7 32.3* 34.0* 32.1* 32.2*  MCV 96.0 95.3 96.3 96.4 97.0  PLT 126* 104* 95* 84* 83*   Cardiac Enzymes:  Recent Labs Lab  01/01/15 0208 01/01/15 0729 01/01/15 1343  TROPONINI <0.03 <0.03 <0.03   BNP (last 3 results)  Recent Labs  03/25/14 1837 12/01/14 0311 01/01/15 0528  BNP 30.4 137.2* 87.1    ProBNP (last 3 results)  Recent Labs  01/26/14 0958  PROBNP 1619.0*    CBG:  Recent Labs Lab 01/01/15 0733 01/03/15 0850  GLUCAP 127* 94    No results found for this or any previous visit (from the past 240 hour(s)).   Studies: Nm Myocar Multi W/spect W/wall Motion / Ef  01/02/2015   CLINICAL DATA:  Chest pain and history of hypertension and coronary artery disease with prior coronary stent placement.  EXAM: MYOCARDIAL IMAGING WITH SPECT (REST AND PHARMACOLOGIC-STRESS)  GATED LEFT VENTRICULAR WALL MOTION STUDY  LEFT VENTRICULAR EJECTION FRACTION  TECHNIQUE: Standard myocardial SPECT imaging was performed after resting intravenous injection of 10 mCi Tc-53m sestamibi. Subsequently, intravenous infusion of Lexiscan was performed under the supervision of the Cardiology staff. At peak effect of the drug, 30 mCi Tc-19m sestamibi was injected intravenously and standard myocardial SPECT imaging was performed. Quantitative gated imaging was also performed to evaluate left ventricular wall motion, and estimate left ventricular ejection fraction.  COMPARISON:  None.  FINDINGS: Perfusion: There is evidence of inducible ischemia involving the mid to distal aspects of the anterior wall, likely in the LAD territory. No fixed perfusion defects are identified.  Wall Motion: Normal left ventricular wall motion. No left ventricular dilation.  Left Ventricular Ejection Fraction: 50 %  End diastolic volume 160 ml  End systolic volume 64 ml  IMPRESSION: 1. Evidence of inducible ischemia in the mid to distal aspect of the anterior wall. This is suggestive of ischemia in the LAD territory.  2. Normal left ventricular wall motion.  3. Left ventricular ejection fraction 50%  4. Intermediate-risk stress test findings*.  *2012  Appropriate Use Criteria for Coronary Revascularization Focused Update: J Am Coll Cardiol. 1093;23(5):573-220. http://content.airportbarriers.com.aspx?articleid=1201161   Electronically Signed   By: Aletta Edouard M.D.   On: 01/02/2015 15:40    Scheduled Meds: . mometasone-formoterol  2 puff Inhalation BID  . nicotine  21 mg Transdermal Daily  . sodium chloride  3 mL Intravenous Q12H  . tiotropium  18 mcg Inhalation Daily   Continuous Infusions: . heparin    . nitroGLYCERIN Stopped (01/02/15 1133)    Principal Problem:   Unstable angina (HCC) Active Problems:   History of pulmonary embolism   History of noncompliance with medical treatment   Thrombocytopenia (HCC)   Tobacco abuse   History of DVT (deep vein thrombosis)   Anemia   Coronary artery disease involving native coronary artery of native heart without angina pectoris   Hepatitis C   Cocaine abuse   Chronic diastolic heart failure (HCC)   QT prolongation   COPD (chronic obstructive pulmonary disease) (HCC)   AKI (acute kidney  injury) [facility])   Essential hypertension    Time spent: 40 minutes    THOMPSON,DANIEL M.D. [facility] Pager [phone]. If 7PM-7AM, please contact night-coverage at [email or web address], password [facility] 01/03/2015, 2:30 PM  LOS: 2 days

## 2015-01-03 NOTE — Progress Notes (Signed)
Patient refused to heave bed alarm turned on despite education regarding the fall risk and safety issues. [facility] [employer]

## 2015-01-03 NOTE — Progress Notes (Signed)
ANTICOAGULATION CONSULT NOTE - Follow-up  Pharmacy Consult for heparin Indication: chest pain/ACS  No Known Allergies  Patient Measurements: Height: 6\' 2"  (188 cm) Weight: 168 lb 9.6 oz (76.476 kg) IBW/kg (Calculated) : 82.2 Heparin Dosing Weight: 79kg  Vital Signs: Temp: 98.2 F (36.8 C) (10/09 0436) Temp Source: Oral (10/09 0436) BP: 138/83 mmHg (10/09 0436) Pulse Rate: 59 (10/09 0436)  Labs:  Recent Labs  12/31/14 2102 01/01/15 0208 01/01/15 0527  01/01/15 0729  01/01/15 1343 01/01/15 1604 01/02/15 0305 01/02/15 1438 01/03/15 0412  HGB 14.2  --   --   < >  --   --   --  11.2* 10.7*  --  10.7*  HCT 42.7  --   --   < >  --   --   --  34.0* 32.1*  --  32.2*  PLT 126*  --   --   < >  --   --   --  95* 84*  --  83*  APTT  --  23*  --   --   --   --   --   --   --   --   --   LABPROT  --  13.8  --   --   --   --   --   --   --   --   --   INR  --  1.04  --   --   --   --   --   --   --   --   --   HEPARINUNFRC  --  <0.10*  --   --   --   < >  --  0.71* 0.57 0.62 0.34  CREATININE 1.27*  --  1.06  --   --   --   --   --   --   --  0.75  TROPONINI  --  <0.03  --   --  <0.03  --  <0.03  --   --   --   --   < > = values in this interval not displayed.  Estimated Creatinine Clearance: 114.2 mL/min (by C-G formula based on Cr of 0.75).  Medications:  Infusions:  . heparin 1,000 Units/hr (01/03/15 0117)  . nitroGLYCERIN Stopped (01/02/15 1133)    Assessment: 3 yom presented to the ED with CP and near syncope, UDS + cocaine. Started on IV heparin for anticoagulation. He was on apixaban PTA for hx of PE and DVT, but he has been non-compliant with this for several weeks. Heparin level remains therapeutic at 0.34. No bleeding noted.   Goal of Therapy:  Heparin level 0.3-0.7 units/ml Monitor platelets by anticoagulation protocol: Yes   Plan:  - Continue heparin gtt at 1000 units/hr - Daily heparin level and CBC  Governor Specking, PharmD Clinical Pharmacy Resident Pager:  [phone]  01/03/2015 9:30 AM

## 2015-01-04 ENCOUNTER — Encounter (HOSPITAL_COMMUNITY)
Admission: EM | Disposition: A | Discharge status: Home / Self Care | Payer: Self-pay | Source: Home / Self Care | Attending: Internal Medicine

## 2015-01-04 DIAGNOSIS — I25110 Atherosclerotic heart disease of native coronary artery with unstable angina pectoris: Principal | ICD-10-CM

## 2015-01-04 DIAGNOSIS — R079 Chest pain, unspecified: Secondary | ICD-10-CM | POA: Insufficient documentation

## 2015-01-04 DIAGNOSIS — I209 Angina pectoris, unspecified: Secondary | ICD-10-CM

## 2015-01-04 HISTORY — PX: CARDIAC CATHETERIZATION: SHX172

## 2015-01-04 LAB — BASIC METABOLIC PANEL
Anion gap: 7 (ref 5–15)
BUN: 10 mg/dL (ref 6–20)
CALCIUM: 9.2 mg/dL (ref 8.9–10.3)
CHLORIDE: 105 mmol/L (ref 101–111)
CO2: 26 mmol/L (ref 22–32)
CREATININE: 0.65 mg/dL (ref 0.61–1.24)
GFR calc non Af Amer: >60 mL/min (ref >60)
Glucose, Bld: 114 mg/dL — ABNORMAL HIGH (ref 65–99)
Potassium: 4.3 mmol/L (ref 3.5–5.1)
SODIUM: 138 mmol/L (ref 135–145)

## 2015-01-04 LAB — GLUCOSE, CAPILLARY: GLUCOSE-CAPILLARY: 88 mg/dL (ref 65–99)

## 2015-01-04 LAB — CBC
HEMATOCRIT: 32.9 % — AB (ref 39.0–52.0)
Hemoglobin: 10.9 g/dL — ABNORMAL LOW (ref 13.0–17.0)
MCH: 31.8 pg (ref 26.0–34.0)
MCHC: 33.1 g/dL (ref 30.0–36.0)
MCV: 95.9 fL (ref 78.0–100.0)
PLATELETS: 99 10*3/uL — AB (ref 150–400)
RBC: 3.43 MIL/uL — ABNORMAL LOW (ref 4.22–5.81)
RDW: 15 % (ref 11.5–15.5)
WBC: 3 10*3/uL — AB (ref 4.0–10.5)

## 2015-01-04 LAB — HEPARIN LEVEL (UNFRACTIONATED): Heparin Unfractionated: 0.32 IU/mL (ref 0.30–0.70)

## 2015-01-04 SURGERY — LEFT HEART CATH AND CORONARY ANGIOGRAPHY
Anesthesia: LOCAL

## 2015-01-04 MED ORDER — IOHEXOL 350 MG/ML SOLN
INTRAVENOUS | Status: DC | PRN
  Administered 2015-01-04: 85 mL via INTRACARDIAC

## 2015-01-04 MED ORDER — MONTELUKAST SODIUM 10 MG PO TABS: 10.0000 mg | ORAL_TABLET | Freq: Every day | ORAL | Status: DC

## 2015-01-04 MED ORDER — VERAPAMIL HCL 2.5 MG/ML IV SOLN
INTRAVENOUS | Status: AC
  Filled 2015-01-04: qty 2

## 2015-01-04 MED ORDER — NICOTINE 21 MG/24HR TD PT24: 21.0000 mg | MEDICATED_PATCH | Freq: Every day | TRANSDERMAL | Status: DC

## 2015-01-04 MED ORDER — FLUTICASONE-SALMETEROL 500-50 MCG/DOSE IN AEPB: 1.0000 | INHALATION_SPRAY | Freq: Two times a day (BID) | RESPIRATORY_TRACT | Status: DC

## 2015-01-04 MED ORDER — SODIUM CHLORIDE 0.9 % IV SOLN: 250.0000 mL | INTRAVENOUS | Status: DC | PRN

## 2015-01-04 MED ORDER — METOPROLOL TARTRATE 25 MG PO TABS: 12.5000 mg | ORAL_TABLET | Freq: Two times a day (BID) | ORAL | Status: DC

## 2015-01-04 MED ORDER — MIDAZOLAM HCL 2 MG/2ML IJ SOLN
INTRAMUSCULAR | Status: DC | PRN
  Administered 2015-01-04: 2 mg via INTRAVENOUS

## 2015-01-04 MED ORDER — HEPARIN SODIUM (PORCINE) 1000 UNIT/ML IJ SOLN
INTRAMUSCULAR | Status: AC
  Filled 2015-01-04: qty 1

## 2015-01-04 MED ORDER — APIXABAN 2.5 MG PO TABS: 2.5000 mg | ORAL_TABLET | Freq: Two times a day (BID) | ORAL | Status: DC

## 2015-01-04 MED ORDER — SODIUM CHLORIDE 0.9 % IJ SOLN: 3.0000 mL | Freq: Two times a day (BID) | INTRAMUSCULAR | Status: DC

## 2015-01-04 MED ORDER — FENTANYL CITRATE (PF) 100 MCG/2ML IJ SOLN
INTRAMUSCULAR | Status: AC
  Filled 2015-01-04: qty 4

## 2015-01-04 MED ORDER — HYDROCHLOROTHIAZIDE 25 MG PO TABS
25.0000 mg | ORAL_TABLET | Freq: Every day | ORAL | Status: DC
Start: 2015-01-04 — End: 2015-09-08

## 2015-01-04 MED ORDER — HEPARIN SODIUM (PORCINE) 1000 UNIT/ML IJ SOLN
INTRAMUSCULAR | Status: DC | PRN
  Administered 2015-01-04: 4000 [IU] via INTRAVENOUS

## 2015-01-04 MED ORDER — SODIUM CHLORIDE 0.9 % IV SOLN: INTRAVENOUS | Status: AC

## 2015-01-04 MED ORDER — TIOTROPIUM BROMIDE MONOHYDRATE 18 MCG IN CAPS: 18.0000 ug | ORAL_CAPSULE | Freq: Every day | RESPIRATORY_TRACT | Status: DC

## 2015-01-04 MED ORDER — LIDOCAINE HCL (PF) 1 % IJ SOLN
INTRAMUSCULAR | Status: AC
  Filled 2015-01-04: qty 30

## 2015-01-04 MED ORDER — LIDOCAINE HCL (PF) 1 % IJ SOLN
INTRAMUSCULAR | Status: DC | PRN
  Administered 2015-01-04: 09:00:00

## 2015-01-04 MED ORDER — HEPARIN (PORCINE) IN NACL 2-0.9 UNIT/ML-% IJ SOLN
INTRAMUSCULAR | Status: AC
Start: 2015-01-04 — End: 2015-01-04
  Filled 2015-01-04: qty 1000

## 2015-01-04 MED ORDER — NITROGLYCERIN 0.4 MG SL SUBL: 0.4000 mg | SUBLINGUAL_TABLET | SUBLINGUAL | Status: DC | PRN

## 2015-01-04 MED ORDER — SODIUM CHLORIDE 0.9 % IJ SOLN: 3.0000 mL | INTRAMUSCULAR | Status: DC | PRN

## 2015-01-04 MED ORDER — VERAPAMIL HCL 2.5 MG/ML IV SOLN
INTRAVENOUS | Status: DC | PRN
  Administered 2015-01-04: 8 mL via INTRA_ARTERIAL

## 2015-01-04 MED ORDER — MIDAZOLAM HCL 2 MG/2ML IJ SOLN
INTRAMUSCULAR | Status: AC
  Filled 2015-01-04: qty 4

## 2015-01-04 MED ORDER — FENTANYL CITRATE (PF) 100 MCG/2ML IJ SOLN
INTRAMUSCULAR | Status: DC | PRN
  Administered 2015-01-04: 50 ug via INTRAVENOUS

## 2015-01-04 SURGICAL SUPPLY — 11 items

## 2015-01-04 NOTE — Progress Notes (Signed)
Utilization review completed. Josaiah Muhammed, RN, BSN. 

## 2015-01-04 NOTE — Discharge Instructions (Signed)
Radial Site Care °Refer to this sheet in the next few weeks. These instructions provide you with information about caring for yourself after your procedure. Your health care provider may also give you more specific instructions. Your treatment has been planned according to current medical practices, but problems sometimes occur. Call your health care provider if you have any problems or questions after your procedure. °WHAT TO EXPECT AFTER THE PROCEDURE °After your procedure, it is typical to have the following: °· Bruising at the radial site that usually fades within 1-2 weeks. °· Blood collecting in the tissue (hematoma) that may be painful to the touch. It should usually decrease in size and tenderness within 1-2 weeks. °HOME CARE INSTRUCTIONS °· Take medicines only as directed by your health care provider. °· You may shower 24-48 hours after the procedure or as directed by your health care provider. Remove the bandage (dressing) and gently wash the site with plain soap and water. Pat the area dry with a clean towel. Do not rub the site, because this may cause bleeding. °· Do not take baths, swim, or use a hot tub until your health care provider approves. °· Check your insertion site every day for redness, swelling, or drainage. °· Do not apply powder or lotion to the site. °· Do not flex or bend the affected arm for 24 hours or as directed by your health care provider. °· Do not push or pull heavy objects with the affected arm for 24 hours or as directed by your health care provider. °· Do not lift over 10 lb (4.5 kg) for 5 days after your procedure or as directed by your health care provider. °· Ask your health care provider when it is okay to: °¨ Return to work or school. °¨ Resume usual physical activities or sports. °¨ Resume sexual activity. °· Do not drive home if you are discharged the same day as the procedure. Have someone else drive you. °· You may drive 24 hours after the procedure unless otherwise  instructed by your health care provider. °· Do not operate machinery or power tools for 24 hours after the procedure. °· If your procedure was done as an outpatient procedure, which means that you went home the same day as your procedure, a responsible adult should be with you for the first 24 hours after you arrive home. °· Keep all follow-up visits as directed by your health care provider. This is important. °SEEK MEDICAL CARE IF: °· You have a fever. °· You have chills. °· You have increased bleeding from the radial site. Hold pressure on the site. °SEEK IMMEDIATE MEDICAL CARE IF: °· You have unusual pain at the radial site. °· You have redness, warmth, or swelling at the radial site. °· You have drainage (other than a small amount of blood on the dressing) from the radial site. °· The radial site is bleeding, and the bleeding does not stop after 30 minutes of holding steady pressure on the site. °· Your arm or hand becomes pale, cool, tingly, or numb. °  °This information is not intended to replace advice given to you by your health care provider. Make sure you discuss any questions you have with your health care provider. °  °Document Released: 04/15/2010 Document Revised: 04/03/2014 Document Reviewed: 09/29/2013 °Elsevier Interactive Patient Education ©2016 [employer] ° °

## 2015-01-04 NOTE — Progress Notes (Signed)
[facility] PROGRESS NOTE  [name] MPN:[id number] DOB: 05-May-1960 DOA: 12/31/2014 PCP: [name], MD  Assessment/Plan: #1 chest pain/Ustable angina Patient with history of coronary artery disease status post bare metal stent and 7/ 2015 in the setting of non-STEMI presenting with left-sided chest discomfort with both typical and atypical features. EKG with no ischemic changes. Cardiac enzymes negative 3. CT angiogram chest negative for PE. 2-D echo with EF of 55-60% with no wall motion abnormalities. Grade 1 diastolic dysfunction. Patient status post Myoview stress test that shows inducible ischemia in the mid to distal aspect of the anterior wall suggestive of ischemia in the LAD region. EF 50%. Normal left ventricular wall motion. Intermediate risk stress test findings. Continue heparin drip. Patient for cardiac catheterization today per cardiology. Cardiology following.  #2 history of DVT/PE Status post IVC filter 2012. Supposed to be on eliquis, however patient has been noncompliant with his medications over the past 2 weeks. CT angiogram chest negative. Patient on IV heparin. Once workup is complete and no need for any further procedures will transition back to eliquis.  #3 chronic diastolic heart failure Stable. Compensated. 2-D echo with EF of 55-60% with no wall motion abnormalities. Grade 1 diastolic dysfunction. Follow.  #4 hypertension Patient on nitro drip. BP stable.  #5 hypokalemia Repleted.  #6 polysubstance abuse/alcohol use Patient states he drinks alcohol on the weekends. Tobacco cessation as well as polysubstance abuse cessation has been stressed to the patient. No signs of withdrawal.  #7 hepatitis C Patient states he will call the ID clinic to set up an appointment for outpatient follow-up and further evaluation. Will hold off on statin for now.  #8 tobacco abuse Tobacco cessation.  #9 prophylaxis Heparin for DVT prophylaxis.   Code Status:  Full Family Communication: Updated patient. No family at bedside. Disposition Plan: Pending cardiac catherization per cardiology.   Consultants:  Cardiology: Dr. Stanford Breed 01/01/2015  Procedures:  CT chest 12/31/2014  Chest x-ray 12/31/2014  2-D echo 01/02/2015  Myoview stress test 01/02/2015.  Antibiotics:  None  HPI/Subjective: Patient states no CP. No SOB.  Objective: Filed Vitals:   01/04/15 0754  BP: 146/96  Pulse: 58  Temp: 98.1 F (36.7 C)  Resp: 16    Intake/Output Summary (Last 24 hours) at 01/04/15 0844 Last data filed at 01/04/15 0800  Gross per 24 hour  Intake    340 ml  Output   2460 ml  Net  -2120 ml   Filed Weights   01/02/15 0447 01/03/15 0436 01/04/15 0432  Weight: 76.613 kg (168 lb 14.4 oz) 76.476 kg (168 lb 9.6 oz) 74.481 kg (164 lb 3.2 oz)    Exam:   General:  NAD  Cardiovascular: RRR  Respiratory: CTAB  Abdomen: Soft, nontender, nondistended, positive bowel sounds.  Musculoskeletal: No clubbing cyanosis or edema.  Data Reviewed: Basic Metabolic Panel:  Recent Labs Lab 12/31/14 2102 01/01/15 0527 01/01/15 0940 01/02/15 0305 01/03/15 0412 01/04/15 0334  NA 140 138  --   --  137 138  K 3.6 3.1*  --   --  4.2 4.3  CL 104 105  --   --  107 105  CO2 23 22  --   --  24 26  GLUCOSE 139* 188*  --   --  117* 114*  BUN 8 7  --   --  7 10  CREATININE 1.27* 1.06  --   --  0.75 0.65  CALCIUM 9.2 8.0*  --   --  8.8* 9.2  MG  --   --  1.6* 2.2  --   --    Liver Function Tests:  Recent Labs Lab 01/01/15 0527  AST 51*  ALT 30  ALKPHOS 67  BILITOT 0.6  PROT 4.9*  ALBUMIN 2.6*   No results for input(s): LIPASE, AMYLASE in the last 168 hours. No results for input(s): AMMONIA in the last 168 hours. CBC:  Recent Labs Lab 01/01/15 0528 01/01/15 1604 01/02/15 0305 01/03/15 0412 01/04/15 0334  WBC 3.1* 2.7* 2.2* 2.4* 3.0*  HGB 10.7* 11.2* 10.7* 10.7* 10.9*  HCT 32.3* 34.0* 32.1* 32.2* 32.9*  MCV 95.3 96.3 96.4 97.0  95.9  PLT 104* 95* 84* 83* 99*   Cardiac Enzymes:  Recent Labs Lab 01/01/15 0208 01/01/15 0729 01/01/15 1343  TROPONINI <0.03 <0.03 <0.03   BNP (last 3 results)  Recent Labs  03/25/14 1837 12/01/14 0311 01/01/15 0528  BNP 30.4 137.2* 87.1    ProBNP (last 3 results)  Recent Labs  01/26/14 0958  PROBNP 1619.0*    CBG:  Recent Labs Lab 01/01/15 0733 01/03/15 0850 01/04/15 0737  GLUCAP 127* 94 88    No results found for this or any previous visit (from the past 240 hour(s)).   Studies: Nm Myocar Multi W/spect W/wall Motion / Ef  01/02/2015   CLINICAL DATA:  Chest pain and history of hypertension and coronary artery disease with prior coronary stent placement.  EXAM: MYOCARDIAL IMAGING WITH SPECT (REST AND PHARMACOLOGIC-STRESS)  GATED LEFT VENTRICULAR WALL MOTION STUDY  LEFT VENTRICULAR EJECTION FRACTION  TECHNIQUE: Standard myocardial SPECT imaging was performed after resting intravenous injection of 10 mCi Tc-1m sestamibi. Subsequently, intravenous infusion of Lexiscan was performed under the supervision of the Cardiology staff. At peak effect of the drug, 30 mCi Tc-50m sestamibi was injected intravenously and standard myocardial SPECT imaging was performed. Quantitative gated imaging was also performed to evaluate left ventricular wall motion, and estimate left ventricular ejection fraction.  COMPARISON:  None.  FINDINGS: Perfusion: There is evidence of inducible ischemia involving the mid to distal aspects of the anterior wall, likely in the LAD territory. No fixed perfusion defects are identified.  Wall Motion: Normal left ventricular wall motion. No left ventricular dilation.  Left Ventricular Ejection Fraction: 50 %  End diastolic volume 924 ml  End systolic volume 64 ml  IMPRESSION: 1. Evidence of inducible ischemia in the mid to distal aspect of the anterior wall. This is suggestive of ischemia in the LAD territory.  2. Normal left ventricular wall motion.  3. Left  ventricular ejection fraction 50%  4. Intermediate-risk stress test findings*.  *2012 Appropriate Use Criteria for Coronary Revascularization Focused Update: J Am Coll Cardiol. 4628;63(8):177-116. http://content.airportbarriers.com.aspx?articleid=1201161   Electronically Signed   By: Aletta Edouard M.D.   On: 01/02/2015 15:40    Scheduled Meds: . [MAR Hold] mometasone-formoterol  2 puff Inhalation BID  . [MAR Hold] nicotine  21 mg Transdermal Daily  . [MAR Hold] sodium chloride  3 mL Intravenous Q12H  . sodium chloride  3 mL Intravenous Q12H  . [MAR Hold] tiotropium  18 mcg Inhalation Daily   Continuous Infusions: . sodium chloride 1 mL/kg/hr (01/04/15 0700)  . heparin    . [MAR Hold] nitroGLYCERIN Stopped (01/02/15 1133)    Principal Problem:   Unstable angina (HCC) Active Problems:   History of pulmonary embolism   History of noncompliance with medical treatment   Thrombocytopenia (HCC)   Tobacco abuse   History of DVT (deep vein thrombosis)   Anemia  Coronary artery disease involving native coronary artery of native heart without angina pectoris   Hepatitis C   Cocaine abuse   Chronic diastolic heart failure (HCC)   QT prolongation   COPD (chronic obstructive pulmonary disease) (HCC)   AKI (acute kidney injury) ([place])   Essential hypertension   Pain in the chest    Time spent: 26 minutes    Brynlynn Walko M.D. [facility] Pager [phone]. If 7PM-7AM, please contact night-coverage at [email or web address], password [facility] 01/04/2015, 8:44 AM  LOS: 3 days

## 2015-01-04 NOTE — H&P (View-Only) (Signed)
  DAILY PROGRESS NOTE  Subjective:  No further chest pain. Stress test yesterday was abnormal, suggestive of LAD territory ischemia. I personally reviewed the images and agree with this assessment. SDS 4 with abnormal TID ratio of 1.31. Symptoms sound typical for unstable angina.  Objective:  Temp:  [98.2 F (36.8 C)-98.6 F (37 C)] 98.2 F (36.8 C) (10/09 0436) Pulse Rate:  [54-90] 59 (10/09 0436) Resp:  [18] 18 (10/08 1500) BP: (134-180)/(78-109) 138/83 mmHg (10/09 0436) SpO2:  [99 %-100 %] 99 % (10/09 0833) Weight:  [168 lb 9.6 oz (76.476 kg)] 168 lb 9.6 oz (76.476 kg) (10/09 0436) Weight change: 1 lb 9.6 oz (0.726 kg)  Intake/Output from previous day: 10/08 0701 - 10/09 0700 In: 860 [P.O.:600; I.V.:260] Out: 1200 [Urine:1200]  Intake/Output from this shift:    Medications: Current Facility-Administered Medications  Medication Dose Route Frequency Provider Last Rate Last Dose  . acetaminophen (TYLENOL) tablet 650 mg  650 mg Oral Q6H PRN Xilin Niu, MD   650 mg at 01/01/15 1714   Or  . acetaminophen (TYLENOL) suppository 650 mg  650 mg Rectal Q6H PRN Xilin Niu, MD      . albuterol (PROVENTIL) (2.5 MG/3ML) 0.083% nebulizer solution 2.5 mg  2.5 mg Nebulization Q6H PRN Xilin Niu, MD      . alum & mag hydroxide-simeth (MAALOX/MYLANTA) 200-200-20 MG/5ML suspension 30 mL  30 mL Oral Q6H PRN Xilin Niu, MD      . diphenhydrAMINE (BENADRYL) capsule 25 mg  25 mg Oral Daily PRN Xilin Niu, MD      . heparin ADULT infusion 100 units/mL (25000 units/250 mL)  1,000 Units/hr Intravenous Continuous Dala Breault C Londyn Wotton, MD      . mometasone-formoterol (DULERA) 200-5 MCG/ACT inhaler 2 puff  2 puff Inhalation BID Xilin Niu, MD   2 puff at 01/03/15 0833  . morphine 2 MG/ML injection 2 mg  2 mg Intravenous Q3H PRN Xilin Niu, MD      . nicotine (NICODERM CQ - dosed in mg/24 hours) patch 21 mg  21 mg Transdermal Daily Xilin Niu, MD   21 mg at 01/01/15 1019  . nitroGLYCERIN (NITROSTAT) SL tablet 0.4  mg  0.4 mg Sublingual Q5 min PRN Jeremiah Gaddy, MD   0.4 mg at 12/31/14 2330  . nitroGLYCERIN 50 mg in dextrose 5 % 250 mL (0.2 mg/mL) infusion  2-200 mcg/min Intravenous Titrated Xilin Niu, MD   Stopped at 01/02/15 1133  . sodium chloride 0.9 % injection 3 mL  3 mL Intravenous Q12H Xilin Niu, MD   3 mL at 01/02/15 1000  . tiotropium (SPIRIVA) inhalation capsule 18 mcg  18 mcg Inhalation Daily Xilin Niu, MD   18 mcg at 01/03/15 0833    Physical Exam: General appearance: alert and no distress Lungs: clear to auscultation bilaterally Heart: regular rate and rhythm, S1, S2 normal, no murmur, click, rub or gallop Extremities: extremities normal, atraumatic, no cyanosis or edema Neurologic: Grossly normal  Lab Results: Results for orders placed or performed during the hospital encounter of 12/31/14 (from the past 48 hour(s))  Troponin I (q 6hr x 3)     Status: None   Collection Time: 01/01/15  1:43 PM  Result Value Ref Range   Troponin I <0.03 <0.031 ng/mL    Comment:        NO INDICATION OF MYOCARDIAL INJURY.   Heparin level (unfractionated)     Status: Abnormal   Collection Time: 01/01/15  4:04 PM  Result Value Ref   Range   Heparin Unfractionated 0.71 (H) 0.30 - 0.70 IU/mL    Comment:        IF HEPARIN RESULTS ARE BELOW EXPECTED VALUES, AND PATIENT DOSAGE HAS BEEN CONFIRMED, SUGGEST FOLLOW UP TESTING OF ANTITHROMBIN III LEVELS.   CBC     Status: Abnormal   Collection Time: 01/01/15  4:04 PM  Result Value Ref Range   WBC 2.7 (L) 4.0 - 10.5 K/uL   RBC 3.53 (L) 4.22 - 5.81 MIL/uL   Hemoglobin 11.2 (L) 13.0 - 17.0 g/dL   HCT 34.0 (L) 39.0 - 52.0 %   MCV 96.3 78.0 - 100.0 fL   MCH 31.7 26.0 - 34.0 pg   MCHC 32.9 30.0 - 36.0 g/dL   RDW 14.9 11.5 - 15.5 %   Platelets 95 (L) 150 - 400 K/uL    Comment: CONSISTENT WITH PREVIOUS RESULT  Heparin level (unfractionated)     Status: None   Collection Time: 01/02/15  3:05 AM  Result Value Ref Range   Heparin Unfractionated 0.57 0.30 -  0.70 IU/mL    Comment:        IF HEPARIN RESULTS ARE BELOW EXPECTED VALUES, AND PATIENT DOSAGE HAS BEEN CONFIRMED, SUGGEST FOLLOW UP TESTING OF ANTITHROMBIN III LEVELS.   CBC     Status: Abnormal   Collection Time: 01/02/15  3:05 AM  Result Value Ref Range   WBC 2.2 (L) 4.0 - 10.5 K/uL   RBC 3.33 (L) 4.22 - 5.81 MIL/uL   Hemoglobin 10.7 (L) 13.0 - 17.0 g/dL   HCT 32.1 (L) 39.0 - 52.0 %   MCV 96.4 78.0 - 100.0 fL   MCH 32.1 26.0 - 34.0 pg   MCHC 33.3 30.0 - 36.0 g/dL   RDW 15.1 11.5 - 15.5 %   Platelets 84 (L) 150 - 400 K/uL    Comment: CONSISTENT WITH PREVIOUS RESULT  Magnesium     Status: None   Collection Time: 01/02/15  3:05 AM  Result Value Ref Range   Magnesium 2.2 1.7 - 2.4 mg/dL  Heparin level (unfractionated)     Status: None   Collection Time: 01/02/15  2:38 PM  Result Value Ref Range   Heparin Unfractionated 0.62 0.30 - 0.70 IU/mL    Comment:        IF HEPARIN RESULTS ARE BELOW EXPECTED VALUES, AND PATIENT DOSAGE HAS BEEN CONFIRMED, SUGGEST FOLLOW UP TESTING OF ANTITHROMBIN III LEVELS.   CBC     Status: Abnormal   Collection Time: 01/03/15  4:12 AM  Result Value Ref Range   WBC 2.4 (L) 4.0 - 10.5 K/uL   RBC 3.32 (L) 4.22 - 5.81 MIL/uL   Hemoglobin 10.7 (L) 13.0 - 17.0 g/dL   HCT 32.2 (L) 39.0 - 52.0 %   MCV 97.0 78.0 - 100.0 fL   MCH 32.2 26.0 - 34.0 pg   MCHC 33.2 30.0 - 36.0 g/dL   RDW 15.1 11.5 - 15.5 %   Platelets 83 (L) 150 - 400 K/uL    Comment: CONSISTENT WITH PREVIOUS RESULT  Heparin level (unfractionated)     Status: None   Collection Time: 01/03/15  4:12 AM  Result Value Ref Range   Heparin Unfractionated 0.34 0.30 - 0.70 IU/mL    Comment:        IF HEPARIN RESULTS ARE BELOW EXPECTED VALUES, AND PATIENT DOSAGE HAS BEEN CONFIRMED, SUGGEST FOLLOW UP TESTING OF ANTITHROMBIN III LEVELS.   Basic metabolic panel     Status: Abnormal   Collection   Time: 01/03/15  4:12 AM  Result Value Ref Range   Sodium 137 135 - 145 mmol/L   Potassium 4.2  3.5 - 5.1 mmol/L    Comment: DELTA CHECK NOTED   Chloride 107 101 - 111 mmol/L   CO2 24 22 - 32 mmol/L   Glucose, Bld 117 (H) 65 - 99 mg/dL   BUN 7 6 - 20 mg/dL   Creatinine, Ser 0.75 0.61 - 1.24 mg/dL   Calcium 8.8 (L) 8.9 - 10.3 mg/dL   GFR calc non Af Amer >60 >60 mL/min   GFR calc Af Amer >60 >60 mL/min    Comment: (NOTE) The eGFR has been calculated using the CKD EPI equation. This calculation has not been validated in all clinical situations. eGFR's persistently <60 mL/min signify possible Chronic Kidney Disease.    Anion gap 6 5 - 15  Glucose, capillary     Status: None   Collection Time: 01/03/15  8:50 AM  Result Value Ref Range   Glucose-Capillary 94 65 - 99 mg/dL    Imaging: Nm Myocar Multi W/spect W/wall Motion / Ef  01/02/2015   CLINICAL DATA:  Chest pain and history of hypertension and coronary artery disease with prior coronary stent placement.  EXAM: MYOCARDIAL IMAGING WITH SPECT (REST AND PHARMACOLOGIC-STRESS)  GATED LEFT VENTRICULAR WALL MOTION STUDY  LEFT VENTRICULAR EJECTION FRACTION  TECHNIQUE: Standard myocardial SPECT imaging was performed after resting intravenous injection of 10 mCi Tc-99m sestamibi. Subsequently, intravenous infusion of Lexiscan was performed under the supervision of the Cardiology staff. At peak effect of the drug, 30 mCi Tc-99m sestamibi was injected intravenously and standard myocardial SPECT imaging was performed. Quantitative gated imaging was also performed to evaluate left ventricular wall motion, and estimate left ventricular ejection fraction.  COMPARISON:  None.  FINDINGS: Perfusion: There is evidence of inducible ischemia involving the mid to distal aspects of the anterior wall, likely in the LAD territory. No fixed perfusion defects are identified.  Wall Motion: Normal left ventricular wall motion. No left ventricular dilation.  Left Ventricular Ejection Fraction: 50 %  End diastolic volume 127 ml  End systolic volume 64 ml  IMPRESSION:  1. Evidence of inducible ischemia in the mid to distal aspect of the anterior wall. This is suggestive of ischemia in the LAD territory.  2. Normal left ventricular wall motion.  3. Left ventricular ejection fraction 50%  4. Intermediate-risk stress test findings*.  *2012 Appropriate Use Criteria for Coronary Revascularization Focused Update: J Am Coll Cardiol. 2012;59(9):857-881. http://content.onlinejacc.org/article.aspx?articleid=1201161   Electronically Signed   By: Glenn  Yamagata M.D.   On: 01/02/2015 15:40    Assessment:  1. Principal Problem: 2.   Chest pain 3. Active Problems: 4.   History of pulmonary embolism 5.   History of noncompliance with medical treatment 6.   Thrombocytopenia (HCC) 7.   Tobacco abuse 8.   History of DVT (deep vein thrombosis) 9.   Anemia 10.   Coronary artery disease involving native coronary artery of native heart without angina pectoris 11.   Hepatitis C 12.   Cocaine abuse 13.   Chronic diastolic heart failure (HCC) 14.   QT prolongation 15.   COPD (chronic obstructive pulmonary disease) (HCC) 16.   AKI (acute kidney injury) (HCC) 17.   Essential hypertension 18.   Left sided chest pain 19.   Plan:  1. Symptoms concerning for unstable angina. Myoview demonstrates reversible anterior ischemia, suggestive of possible mid-distal LAD disease, however TID ratio is elevated and it may be   more proximal. Currently chest pain free. Cardiac markers have been negative. He remains on heparin. I'm recommending a left heart catheterization. I discussed the risks/benefits of the procedure today and he is willing to proceed.  Will place cath orders. Ok for clear liquid diet tomorrow morning and then NPO after.  Time Spent Directly with Patient:  15 minutes  Length of Stay:  LOS: 2 days   Andree Golphin C. Peja Allender, MD, FACC Attending Cardiologist [facility]  Genie Wenke C Trenna Kiely 01/03/2015, 10:57 AM     

## 2015-01-04 NOTE — Progress Notes (Signed)
Subjective:  No CP/SOB. Adm with [place] s/p cath today with stable anatomy/ Med Rx  Objective:  Temp:  [97.5 F (36.4 C)-98.6 F (37 C)] 98.1 F (36.7 C) (10/10 0754) Pulse Rate:  [52-81] 59 (10/10 0930) Resp:  [9-22] 12 (10/10 0930) BP: (127-169)/(78-105) 146/99 mmHg (10/10 0930) SpO2:  [97 %-100 %] 99 % (10/10 0930) Weight:  [164 lb 3.2 oz (74.481 kg)] 164 lb 3.2 oz (74.481 kg) (10/10 0432) Weight change: -4 lb 6.4 oz (-1.996 kg)  Intake/Output from previous day: 10/09 0701 - 10/10 0700 In: 340 [P.O.:100; I.V.:240] Out: 2050 [Urine:2050]  Intake/Output from this shift: Total I/O In: 0  Out: 410 [Urine:410]  Physical Exam: General appearance: alert and no distress Neck: no adenopathy, no carotid bruit, no JVD, supple, symmetrical, trachea midline and thyroid not enlarged, symmetric, no tenderness/mass/nodules Lungs: clear to auscultation bilaterally Heart: regular rate and rhythm, S1, S2 normal, no murmur, click, rub or gallop Extremities: extremities normal, atraumatic, no cyanosis or edema and TR band right wrist  Lab Results: Results for orders placed or performed during the hospital encounter of 12/31/14 (from the past 48 hour(s))  Heparin level (unfractionated)     Status: None   Collection Time: 01/02/15  2:38 PM  Result Value Ref Range   Heparin Unfractionated 0.62 0.30 - 0.70 IU/mL    Comment:        IF HEPARIN RESULTS ARE BELOW EXPECTED VALUES, AND PATIENT DOSAGE HAS BEEN CONFIRMED, SUGGEST FOLLOW UP TESTING OF ANTITHROMBIN III LEVELS.   CBC     Status: Abnormal   Collection Time: 01/03/15  4:12 AM  Result Value Ref Range   WBC 2.4 (L) 4.0 - 10.5 K/uL   RBC 3.32 (L) 4.22 - 5.81 MIL/uL   Hemoglobin 10.7 (L) 13.0 - 17.0 g/dL   HCT 32.2 (L) 39.0 - 52.0 %   MCV 97.0 78.0 - 100.0 fL   MCH 32.2 26.0 - 34.0 pg   MCHC 33.2 30.0 - 36.0 g/dL   RDW 15.1 11.5 - 15.5 %   Platelets 83 (L) 150 - 400 K/uL    Comment: CONSISTENT WITH PREVIOUS RESULT  Heparin  level (unfractionated)     Status: None   Collection Time: 01/03/15  4:12 AM  Result Value Ref Range   Heparin Unfractionated 0.34 0.30 - 0.70 IU/mL    Comment:        IF HEPARIN RESULTS ARE BELOW EXPECTED VALUES, AND PATIENT DOSAGE HAS BEEN CONFIRMED, SUGGEST FOLLOW UP TESTING OF ANTITHROMBIN III LEVELS.   Basic metabolic panel     Status: Abnormal   Collection Time: 01/03/15  4:12 AM  Result Value Ref Range   Sodium 137 135 - 145 mmol/L   Potassium 4.2 3.5 - 5.1 mmol/L    Comment: DELTA CHECK NOTED   Chloride 107 101 - 111 mmol/L   CO2 24 22 - 32 mmol/L   Glucose, Bld 117 (H) 65 - 99 mg/dL   BUN 7 6 - 20 mg/dL   Creatinine, Ser 0.75 0.61 - 1.24 mg/dL   Calcium 8.8 (L) 8.9 - 10.3 mg/dL   GFR calc non Af Amer >60 >60 mL/min   GFR calc Af Amer >60 >60 mL/min    Comment: (NOTE) The eGFR has been calculated using the CKD EPI equation. This calculation has not been validated in all clinical situations. eGFR's persistently <60 mL/min signify possible Chronic Kidney Disease.    Anion gap 6 5 - 15  Glucose, capillary  Status: None   Collection Time: 01/03/15  8:50 AM  Result Value Ref Range   Glucose-Capillary 94 65 - 99 mg/dL  CBC     Status: Abnormal   Collection Time: 01/04/15  3:34 AM  Result Value Ref Range   WBC 3.0 (L) 4.0 - 10.5 K/uL   RBC 3.43 (L) 4.22 - 5.81 MIL/uL   Hemoglobin 10.9 (L) 13.0 - 17.0 g/dL   HCT 32.9 (L) 39.0 - 52.0 %   MCV 95.9 78.0 - 100.0 fL   MCH 31.8 26.0 - 34.0 pg   MCHC 33.1 30.0 - 36.0 g/dL   RDW 15.0 11.5 - 15.5 %   Platelets 99 (L) 150 - 400 K/uL    Comment: REPEATED TO VERIFY CONSISTENT WITH PREVIOUS RESULT   Basic metabolic panel     Status: Abnormal   Collection Time: 01/04/15  3:34 AM  Result Value Ref Range   Sodium 138 135 - 145 mmol/L   Potassium 4.3 3.5 - 5.1 mmol/L   Chloride 105 101 - 111 mmol/L   CO2 26 22 - 32 mmol/L   Glucose, Bld 114 (H) 65 - 99 mg/dL   BUN 10 6 - 20 mg/dL   Creatinine, Ser 0.65 0.61 - 1.24 mg/dL     Calcium 9.2 8.9 - 10.3 mg/dL   GFR calc non Af Amer >60 >60 mL/min   GFR calc Af Amer >60 >60 mL/min    Comment: (NOTE) The eGFR has been calculated using the CKD EPI equation. This calculation has not been validated in all clinical situations. eGFR's persistently <60 mL/min signify possible Chronic Kidney Disease.    Anion gap 7 5 - 15  Heparin level (unfractionated)     Status: None   Collection Time: 01/04/15  5:00 AM  Result Value Ref Range   Heparin Unfractionated 0.32 0.30 - 0.70 IU/mL    Comment:        IF HEPARIN RESULTS ARE BELOW EXPECTED VALUES, AND PATIENT DOSAGE HAS BEEN CONFIRMED, SUGGEST FOLLOW UP TESTING OF ANTITHROMBIN III LEVELS.   Glucose, capillary     Status: None   Collection Time: 01/04/15  7:37 AM  Result Value Ref Range   Glucose-Capillary 88 65 - 99 mg/dL    Imaging: Imaging results have been reviewed  Assessment/Plan:   1. Principal Problem: 2.   Unstable angina ([place]) 3. Active Problems: 4.   History of pulmonary embolism 5.   History of noncompliance with medical treatment 6.   Thrombocytopenia ([place]) 7.   Tobacco abuse 8.   History of DVT (deep vein thrombosis) 9.   Anemia 10.   Coronary artery disease involving native coronary artery of native heart without angina pectoris 11.   Hepatitis C 12.   Cocaine abuse 13.   Chronic diastolic heart failure ([place]) 14.   QT prolongation 15.   COPD (chronic obstructive pulmonary disease) ([place]) 16.   AKI (acute kidney injury) ([place]) 17.   Essential hypertension 18.   Pain in the chest 19.   Time Spent Directly with Patient:  20 minutes  Length of Stay:  LOS: 3 days   S/P radial LHC. Mod CAD. Occluded BMS diag. Nl LV Fxn. + MV for Ant ischemia. OK for D/C home later today. ROV with Dr. Aundra Dubin as OP 3-4 weeks.   Quay Burow 01/04/2015, 9:57 AM

## 2015-01-04 NOTE — Progress Notes (Signed)
ANTICOAGULATION CONSULT NOTE - Follow-up  Pharmacy Consult for heparin Indication: chest pain/ACS  No Known Allergies  Patient Measurements: Height: 6\' 2"  (188 cm) Weight: 164 lb 3.2 oz (74.481 kg) IBW/kg (Calculated) : 82.2 Heparin Dosing Weight: 79kg  Vital Signs: Temp: 98.1 F (36.7 C) (10/10 0754) Temp Source: Oral (10/10 0754) BP: 146/96 mmHg (10/10 0754) Pulse Rate: 58 (10/10 0754)  Labs:  Recent Labs  01/01/15 1343  01/02/15 0305 01/02/15 1438 01/03/15 0412 01/04/15 0334 01/04/15 0500  HGB  --   < > 10.7*  --  10.7* 10.9*  --   HCT  --   < > 32.1*  --  32.2* 32.9*  --   PLT  --   < > 84*  --  83* 99*  --   HEPARINUNFRC  --   < > 0.57 0.62 0.34  --  0.32  CREATININE  --   --   --   --  0.75 0.65  --   TROPONINI <0.03  --   --   --   --   --   --   < > = values in this interval not displayed.  Estimated Creatinine Clearance: 111.2 mL/min (by C-G formula based on Cr of 0.65).  Medications:  Infusions:  . sodium chloride    . heparin    . nitroGLYCERIN Stopped (01/02/15 1133)    Assessment: 15 yom presented to the ED with CP and near syncope, UDS + cocaine. Started on IV heparin for anticoagulation. He was on apixaban PTA for hx of PE and DVT, but he has been non-compliant with this for several weeks. Heparin level remains therapeutic at 0.32. Patient is noted for cath today.  Goal of Therapy:  Heparin level 0.3-0.7 units/ml Monitor platelets by anticoagulation protocol: Yes   Plan:  - Continue heparin gtt at 1000 units/hr - Daily heparin level and CBC -Will follow plans post cath  Hildred Laser, Pharm D 01/04/2015 8:25 AM

## 2015-01-04 NOTE — Discharge Summary (Addendum)
Physician Discharge Summary  [name] VXB:[id number] DOB: 1961-01-29 DOA: 12/31/2014  PCP: [name], MD  Admit date: 12/31/2014 Discharge date: 01/04/2015  Time spent: 65 minutes  Recommendations for Outpatient Follow-up:  1. Follow-up with [name], MD in 1-2 weeks. On follow up patient will need a basic metabolic profile done to follow-up on electrolytes and renal function. Patient's chronic medical issues need to be reassessed. 2. Follow-up with Dr.McLean, cardiology in 3-4 weeks. 3. Follow up in ID clinic for hepatitis C.  Discharge Diagnoses:  Principal Problem:   Unstable angina (HCC) Active Problems:   History of pulmonary embolism   History of noncompliance with medical treatment   Thrombocytopenia (HCC)   Tobacco abuse   History of DVT (deep vein thrombosis)   Anemia   Coronary artery disease involving native coronary artery of native heart without angina pectoris   Hepatitis C   Cocaine abuse   Chronic diastolic heart failure (HCC)   QT prolongation   COPD (chronic obstructive pulmonary disease) (HCC)   AKI (acute kidney injury) ([place])   Essential hypertension   Pain in the chest   Chest pain   Discharge Condition: Stable and improved  Diet recommendation: Heart healthy  Filed Weights   01/02/15 0447 01/03/15 0436 01/04/15 0432  Weight: 76.613 kg (168 lb 14.4 oz) 76.476 kg (168 lb 9.6 oz) 74.481 kg (164 lb 3.2 oz)    History of present illness:  Per Dr Julieanne Manson is a 54 y.o. male with PMH of DVT, PE, s/p of vena cava filter placement 2012, CAD, S/P of stent placement 2015, COPD, asthma, diastolic congestive heart failure, hypertension, medication noncompliance (not take all medications for 2 weeks), who presented with chest pain.  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 It was associated with diaphoresis, nausea, lightheadedness and near  syncope. Chest pain was located in the left chest initially, then involving mid chest. It was constant, 9 out of 10 in severity. Patient did not have cough, fever, chills, shortness of breath. He stated that he has not been taking any of his medications in the past 2 weeks. Patient stated that he started having some relief after EMS gave aspirin and nitroglycerin. Patient was initially hypotensive 88/40 in the field but has been normotensive here. Patient did not have abdominal pain, diarrhea, symptoms of a UTI, unilateral weakness.  In ED, patient was found to have negative troponin, WBC 6.7, temperature normal, no tachycardia, AKI, negative chest x-ray. CT angioigram of chest is negative for PE, but showed mucoid filling of left upper lobe segmental airways suggesting an inflammatory process of the bronchials.   Hospital Course:  #1 Unstable angina Patient with history of coronary artery disease status post bare metal stent and 7/ 2015 in the setting of non-STEMI presenting with left-sided chest discomfort with both typical and atypical features. EKG with no ischemic changes. Cardiac enzymes negative 3. CT angiogram chest negative for PE. Cardiology was consulted and followed the patient throughout the hospitalization. 2-D echo with EF of 55-60% with no wall motion abnormalities. Grade 1 diastolic dysfunction. Patient status post Myoview stress test that shows inducible ischemia in the mid to distal aspect of the anterior wall suggestive of ischemia in the LAD region. EF 50%. Normal left ventricular wall motion. Intermediate risk stress test findings. Patient was maintained on a heparin drip as well as nitroglycerin drip. Patient remained chest pain-free throughout the hospitalization. Patient  subsequently underwent a cardiac catheterization on 01/04/2015 that showed moderate coronary artery disease. Occluded metal stent diagonally. Normal left ventricular function. Patient was noted to have double vessel  coronary artery disease. Medical management was recommended. Patient will be discharged on eliquis, low-dose beta blocker, his home regimen of HCTZ. Unable to place patient on a statin secondary to history of hepatitis C. Patient is to see ID as outpatient for further evaluation of his hepatitis C. Patient will follow-up with cardiology as outpatient in 3-4 weeks.   #2 history of DVT/PE Status post IVC filter 2012. Supposed to be on eliquis, however patient has been noncompliant with his medications over the past 2 weeks. CT angiogram chest negative. Patient on IV heparin during the hospitalization. Patient underwent a cardiac catheterization was recommended medical management. Patient be discharged home on eliquis.  #3 chronic diastolic heart failure Stable. Compensated. 2-D echo with EF of 55-60% with no wall motion abnormalities. Grade 1 diastolic dysfunction. Follow.  #4 hypertension Patient on nitro drip. BP stable. On discharge patient be discharged on his home dose HCTZ as well as a low-dose Lopressor.  #5 hypokalemia Repleted.  #6 polysubstance abuse/alcohol use Patient stated he drank alcohol on the weekends. Tobacco cessation as well as polysubstance abuse cessation has been stressed to the patient. No signs of withdrawal.  #7 hepatitis C Patient states he will call the ID clinic to set up an appointment for outpatient follow-up and further evaluation. Will hold off on statin for now.  #8 tobacco abuse Tobacco cessation. Patient was placed on a nicotine patch.  Procedures:  CT chest 12/31/2014  Chest x-ray 12/31/2014  2-D echo 01/02/2015  Myoview stress test 01/02/2015.  Cardiac catheterization 01/04/2015 Dr. Gwenlyn Found  Consultations:  Cardiology: Dr. Stanford Breed 01/01/2015  Discharge Exam: Filed Vitals:   01/04/15 0930  BP: 146/99  Pulse: 59  Temp:   Resp: 12    General: NAD Cardiovascular: RRR Respiratory: CTAB  Discharge Instructions   Discharge  Instructions    Diet - low sodium heart healthy    Complete by:  As directed      Discharge instructions    Complete by:  As directed   Follow up Dr Aundra Dubin in 3-4 weeks. Follow up with [name], MD in 2 weeks. Follow up in ID clinic.     Increase activity slowly    Complete by:  As directed           Current Discharge Medication List    START taking these medications   Details  metoprolol (LOPRESSOR) 25 MG tablet Take 0.5 tablets (12.5 mg total) by mouth 2 (two) times daily. Qty: 60 tablet, Refills: 0    nicotine (NICODERM CQ - DOSED IN MG/24 HOURS) 21 mg/24hr patch Place 1 patch (21 mg total) onto the skin daily. Qty: 28 patch, Refills: 0      CONTINUE these medications which have CHANGED   Details  apixaban (ELIQUIS) 2.5 MG TABS tablet Take 1 tablet (2.5 mg total) by mouth 2 (two) times daily. Qty: 60 tablet, Refills: 0    Fluticasone-Salmeterol (ADVAIR DISKUS) 500-50 MCG/DOSE AEPB Inhale 1 puff into the lungs 2 (two) times daily. Qty: 60 each, Refills: 0    hydrochlorothiazide (HYDRODIURIL) 25 MG tablet Take 1 tablet (25 mg total) by mouth daily. Qty: 30 tablet, Refills: 0    montelukast (SINGULAIR) 10 MG tablet Take 1 tablet (10 mg total) by mouth at bedtime. Qty: 30 tablet, Refills: 0    nitroGLYCERIN (NITROSTAT) 0.4 MG SL  tablet Place 1 tablet (0.4 mg total) under the tongue every 5 (five) minutes as needed for chest pain (CP or SOB). Qty: 15 tablet, Refills: 0    tiotropium (SPIRIVA) 18 MCG inhalation capsule Place 1 capsule (18 mcg total) into inhaler and inhale daily. Qty: 30 capsule, Refills: 0      CONTINUE these medications which have NOT CHANGED   Details  albuterol (PROVENTIL HFA;VENTOLIN HFA) 108 (90 BASE) MCG/ACT inhaler Inhale 2 puffs into the lungs every 6 (six) hours as needed for wheezing or shortness of breath. Qty: 1 Inhaler, Refills: 10    albuterol (PROVENTIL) (2.5 MG/3ML) 0.083% nebulizer solution Take 3 mLs (2.5 mg total) by  nebulization every 6 (six) hours as needed for wheezing or shortness of breath. Qty: 75 mL, Refills: 12    diphenhydrAMINE (BENADRYL) 25 mg capsule Take 25 mg by mouth daily as needed for allergies.    folic acid (FOLVITE) 1 MG tablet Take 1 tablet (1 mg total) by mouth daily. Qty: 30 tablet, Refills: 0    thiamine 100 MG tablet Take 1 tablet (100 mg total) by mouth daily. Qty: 30 tablet, Refills: 0      STOP taking these medications     amLODipine (NORVASC) 10 MG tablet      azithromycin (ZITHROMAX) 250 MG tablet      doxycycline (VIBRAMYCIN) 100 MG capsule      predniSONE (DELTASONE) 5 MG tablet        No Known Allergies Follow-up Information    Follow up with Richardson Dopp, PA-C On 01/18/2015.   Specialties:  Physician Assistant, Radiology, Interventional Cardiology   Why:  11:50 AM   Contact information:   [address] [place] [address] [phone]       Follow up with [name], MD. Schedule an appointment as soon as possible for a visit in 2 weeks.   Specialty:  Specialist   Contact information:   [address] [place] [address] [phone]       Follow up with [facility]             . Schedule an appointment as soon as possible for a visit in 1 week.   Why:  f/u in ID clinic for hepatitis C.   Contact information:   [address] [place] [phone]        The results of significant diagnostics from this hospitalization (including imaging, microbiology, ancillary and laboratory) are listed below for reference.    Significant Diagnostic Studies: Dg Chest 2 View  12/31/2014   CLINICAL DATA:  Acute onset of chest tightness and diaphoresis with hypotension  EXAM: CHEST - 2 VIEW  COMPARISON:  12/01/2014  FINDINGS: Cardiac shadow is within normal limits. The lungs are clear bilaterally. No acute infiltrate or effusion is seen. No bony abnormality is noted.   IMPRESSION: No active disease.   Electronically Signed   By: Inez Catalina M.D.   On: 12/31/2014 21:47   Ct Angio Chest Pe W/cm &/or Wo Cm  12/31/2014   CLINICAL DATA:  Mid the left chest pain today. Some short of breath. History of DVT and pulmonary embolism.  EXAM: CT ANGIOGRAPHY CHEST WITH CONTRAST  TECHNIQUE: Multidetector CT imaging of the chest was performed using the standard protocol during bolus administration of intravenous contrast. Multiplanar CT image reconstructions and MIPs were obtained to evaluate the vascular anatomy.  CONTRAST:  59mL OMNIPAQUE IOHEXOL 350  MG/ML SOLN  COMPARISON:  12/02/2014  FINDINGS: No filling defects are present in the pulmonary arterial tree to suggest acute pulmonary thromboembolism.  No abnormal mediastinal adenopathy. Coronary artery calcifications are present.  No pneumothorax.  No pleural effusion  Lungs are clear. There is mucous filling of left upper lobe segmental airways.  Simple cyst in the left kidney.  No vertebral compression deformity.  Review of the MIP images confirms the above findings.  IMPRESSION: No evidence of acute pulmonary thromboembolism  Mucoid filling of left upper lobe segmental airways suggesting an inflammatory process of the bronchials.   Electronically Signed   By: Marybelle Killings M.D.   On: 12/31/2014 23:44   US Renal  01/01/2015   CLINICAL DATA:  Acute kidney injury  EXAM: RENAL / URINARY TRACT ULTRASOUND COMPLETE  COMPARISON:  None.  FINDINGS: Right Kidney:  Length: 9.2 cm. Echogenicity within normal limits. No mass or hydronephrosis visualized.  Left Kidney:  Length: 10.2 cm. Echogenicity within normal limits. No mass or hydronephrosis visualized.  Bladder:  Appears normal for degree of bladder distention.  IMPRESSION: Negative for hydronephrosis.   Electronically Signed   By: Andreas Newport M.D.   On: 01/01/2015 02:41   Nm Myocar Multi W/spect W/wall Motion / Ef  01/02/2015   CLINICAL DATA:  Chest pain and history of hypertension  and coronary artery disease with prior coronary stent placement.  EXAM: MYOCARDIAL IMAGING WITH SPECT (REST AND PHARMACOLOGIC-STRESS)  GATED LEFT VENTRICULAR WALL MOTION STUDY  LEFT VENTRICULAR EJECTION FRACTION  TECHNIQUE: Standard myocardial SPECT imaging was performed after resting intravenous injection of 10 mCi Tc-83m sestamibi. Subsequently, intravenous infusion of Lexiscan was performed under the supervision of the Cardiology staff. At peak effect of the drug, 30 mCi Tc-79m sestamibi was injected intravenously and standard myocardial SPECT imaging was performed. Quantitative gated imaging was also performed to evaluate left ventricular wall motion, and estimate left ventricular ejection fraction.  COMPARISON:  None.  FINDINGS: Perfusion: There is evidence of inducible ischemia involving the mid to distal aspects of the anterior wall, likely in the LAD territory. No fixed perfusion defects are identified.  Wall Motion: Normal left ventricular wall motion. No left ventricular dilation.  Left Ventricular Ejection Fraction: 50 %  End diastolic volume 950 ml  End systolic volume 64 ml  IMPRESSION: 1. Evidence of inducible ischemia in the mid to distal aspect of the anterior wall. This is suggestive of ischemia in the LAD territory.  2. Normal left ventricular wall motion.  3. Left ventricular ejection fraction 50%  4. Intermediate-risk stress test findings*.  *2012 Appropriate Use Criteria for Coronary Revascularization Focused Update: J Am Coll Cardiol. 9326;71(2):458-099. http://content.airportbarriers.com.aspx?articleid=1201161   Electronically Signed   By: Aletta Edouard M.D.   On: 01/02/2015 15:40    Microbiology: No results found for this or any previous visit (from the past 240 hour(s)).   Labs: Basic Metabolic Panel:  Recent Labs Lab 12/31/14 2102 01/01/15 0527 01/01/15 0940 01/02/15 0305 01/03/15 0412 01/04/15 0334  NA 140 138  --   --  137 138  K 3.6 3.1*  --   --  4.2 4.3  CL 104  105  --   --  107 105  CO2 23 22  --   --  24 26  GLUCOSE 139* 188*  --   --  117* 114*  BUN 8 7  --   --  7 10  CREATININE 1.27* 1.06  --   --  0.75 0.65  CALCIUM 9.2  8.0*  --   --  8.8* 9.2  MG  --   --  1.6* 2.2  --   --    Liver Function Tests:  Recent Labs Lab 01/01/15 0527  AST 51*  ALT 30  ALKPHOS 67  BILITOT 0.6  PROT 4.9*  ALBUMIN 2.6*   No results for input(s): LIPASE, AMYLASE in the last 168 hours. No results for input(s): AMMONIA in the last 168 hours. CBC:  Recent Labs Lab 01/01/15 0528 01/01/15 1604 01/02/15 0305 01/03/15 0412 01/04/15 0334  WBC 3.1* 2.7* 2.2* 2.4* 3.0*  HGB 10.7* 11.2* 10.7* 10.7* 10.9*  HCT 32.3* 34.0* 32.1* 32.2* 32.9*  MCV 95.3 96.3 96.4 97.0 95.9  PLT 104* 95* 84* 83* 99*   Cardiac Enzymes:  Recent Labs Lab 01/01/15 0208 01/01/15 0729 01/01/15 1343  TROPONINI <0.03 <0.03 <0.03   BNP: BNP (last 3 results)  Recent Labs  03/25/14 1837 12/01/14 0311 01/01/15 0528  BNP 30.4 137.2* 87.1    ProBNP (last 3 results)  Recent Labs  01/26/14 0958  PROBNP 1619.0*    CBG:  Recent Labs Lab 01/01/15 0733 01/03/15 0850 01/04/15 0737  GLUCAP 127* 94 88       Signed:  THOMPSON,DANIEL MD [facility] 01/04/2015, 2:21 PM

## 2015-01-04 NOTE — Care Management Important Message (Signed)
Important Message  Patient Details  Name: [name] MRN: [id number] Date of Birth: [birth year]   Medicare Important Message Given:  Yes-second notification given    Erenest Rasher, RN 01/04/2015, 12:29 PM

## 2015-01-04 NOTE — Care Management Note (Addendum)
Case Management Note  Patient Details  Name: [name] MRN: [id number] Date of Birth: [birth year]  Subjective/Objective:       Unstable angina, LHC 01/04/2015             Action/Plan: NCM spoke to pt and admits he does not take his medications as prescribed. Pt verbalized understanding of compliance with medical treatment to avoid serious health complications. States his girlfriend is at home to assist with his care as needed. Provided pt with 30 day free trial card for Eliquis. Pt states he has Eliquis at home.  No NCM needs identified.   Expected Discharge Date:  01/04/2015              Expected Discharge Plan:  Home/Self Care  In-House Referral:     Discharge planning Services  CM Consult   Status of Service:  Completed, signed off  Medicare Important Message Given:  Yes-second notification given Date Medicare IM Given:    Medicare IM give by:    Date Additional Medicare IM Given:    Additional Medicare Important Message give by:     If discussed at [place] of Stay Meetings, dates discussed:    Additional Comments:  Erenest Rasher, RN 01/04/2015, 12:28 PM

## 2015-01-04 NOTE — Interval H&P Note (Signed)
History and Physical Interval Note:  01/04/2015 9:03 AM  [name]  has presented today for cardiac cath with the diagnosis of unstable angina  The various methods of treatment have been discussed with the patient and family. After consideration of risks, benefits and other options for treatment, the patient has consented to  Procedure(s): Left Heart Cath and Coronary Angiography (N/A) as a surgical intervention .  The patient's history has been reviewed, patient examined, no change in status, stable for surgery.  I have reviewed the patient's chart and labs.  Questions were answered to the patient's satisfaction.    Cath Lab Visit (complete for each Cath Lab visit)  Clinical Evaluation Leading to the Procedure:   ACS: No.  Non-ACS:    Anginal Classification: CCS III  Anti-ischemic medical therapy: Minimal Therapy (1 class of medications)  Non-Invasive Test Results: Intermediate-risk stress test findings: cardiac mortality 1-3%/year  Prior CABG: No previous CABG         MCALHANY,CHRISTOPHER

## 2015-01-05 ENCOUNTER — Encounter (HOSPITAL_COMMUNITY): Payer: Self-pay | Admitting: Cardiovascular Disease

## 2015-01-17 NOTE — Progress Notes (Signed)
Cardiology Office Note   Date:  01/18/2015   ID:  [name], DOB 1960-12-13, MRN [id number]  PCP:  [name] Junior, MD  Cardiologist:  Dr. Loralie [name]   Electrophysiologist:  n/a  Chief Complaint  Patient presents with  . Hospitalization Follow-up    Unstable Angina >> s/p Cardiac Cath  . Coronary Artery Disease     History of Present Illness: [name] is a 54 y.o. male with a hx of DVT and PE s/p IVC filter in 2012, CAD s/p stent to D1 in 2015, COPD/asthma, diastolic HF, HTN, HCV, prior cocaine abuse, tobacco abuse.   He was admitted in 7/15 with NSTEMI tx with BMS to the Dx.  EF was 45-50% at that time.  He has been non-adherent with medications and anticoagulation.  He has been seen by our service multiple times in the hospital since that time without outpatient FU.  FU echo in 11/15 demonstrated normal LVF.    Admitted 10/6-10/10 with chest pain.  Troponin levels were neg.  CTA was neg for PE.  Inpatient Myoview was abnormal with anterior ischemia. Cardiac catheterization demonstrated an occluded diagonal stent and mild to moderate nausea to disease elsewhere. EF remained normal on echocardiogram. Medical therapy was recommended.   Studies/Reports Reviewed Today:  LHC 01/04/15 LAD: Proximal 50%, D1 stent occluded RI: 60% LCx: Ostial 20% RCA: Proximal 30%, mid 30% Normal LV function  1. Double vessel CAD  2. Moderate stenosis moderate caliber Ramus Intermediate branch, does not appear to be flow limiting.  3. Mild to moderate disease in the LAD, RCA and Circumflex.  4. Chronic total occlusion bare metal stent in Diagonal branch. 5. Low normal LV systolic function  Recommendations: Continue medical management of CAD. He admits today to continued tobacco abuse and medication non-compliance. He responded poorly to bare metal stenting in the past. I would maximize his anti-anginal medications and encourage tobacco cessation. He can be discharged home later  today. Follow up with Dr. Loralie [name] following discharge.     Myoview 01/02/15 IMPRESSION: 1. Evidence of inducible ischemia in the mid to distal aspect of the anterior wall. This is suggestive of ischemia in the LAD territory. 2. Normal left ventricular wall motion. 3. Left ventricular ejection fraction 50% 4. Intermediate-risk stress test findings*.  Echo 01/02/15 Mild LVH, EF 55-60%, normal wall motion, grade 1 diastolic dysfunction, trivial TR, PASP normal    Past Medical History  Diagnosis Date  . Asthma   . DVT (deep venous thrombosis) ([place])     "I had ~ 10 in each leg"  . Pulmonary embolism ([place])     a. 2012 - s/p IVC filter;  b. prev on eliquis - noncompliant.  Marland Kitchen CAD (coronary artery disease)     a. 09/2013 NSTEMI/PCI: LM nl, LAD 40-50%, D1 100 (2.25 x 28 Vision BMS), LCX min irregs, RI 60-70, 30, RCA 40-50/50-45ms/p, EF 45-50%.  Marland Kitchen COPD (chronic obstructive pulmonary disease) ([place])   . History of pneumonia   . Sinus headache   . Osteoarthritis     a. hands and toes.  . Chronic diastolic CHF (congestive heart failure) ([place])     a. 09/2014 EF 45-50% by LV gram;  b. 01/2014 Echo: EF 55-60%, Gr 1 DD.  Marland Kitchen Essential hypertension   . Tobacco abuse   . Hepatitis C   . Cocaine abuse     Past Surgical History  Procedure Laterality Date  . Vena cava filter placement  " ~ 2012  . Coronary  angioplasty with stent placement  10/21/13    BMS to D1  . Left heart catheterization with coronary angiogram N/A 10/21/2013    Procedure: LEFT HEART CATHETERIZATION WITH CORONARY ANGIOGRAM;  Surgeon: Leonie Man, MD;  Location: [facility] CATH LAB;  Service: Cardiovascular;  Laterality: N/A;  . Cardiac catheterization N/A 01/04/2015    Procedure: Left Heart Cath and Coronary Angiography;  Surgeon: Burnell Blanks, MD;  Location: [facility] CV LAB;  Service: Cardiovascular;  Laterality: N/A;     Current Outpatient Prescriptions  Medication Sig Dispense Refill  . albuterol (PROVENTIL  HFA;VENTOLIN HFA) 108 (90 BASE) MCG/ACT inhaler Inhale 2 puffs into the lungs every 6 (six) hours as needed for wheezing or shortness of breath. 1 Inhaler 10  . albuterol (PROVENTIL) (2.5 MG/3ML) 0.083% nebulizer solution Take 3 mLs (2.5 mg total) by nebulization every 6 (six) hours as needed for wheezing or shortness of breath. 75 mL 12  . apixaban (ELIQUIS) 2.5 MG TABS tablet Take 1 tablet (2.5 mg total) by mouth 2 (two) times daily. 60 tablet 0  . diphenhydrAMINE (BENADRYL) 25 mg capsule Take 25 mg by mouth daily as needed for allergies.    . Fluticasone-Salmeterol (ADVAIR DISKUS) 500-50 MCG/DOSE AEPB Inhale 1 puff into the lungs 2 (two) times daily. 60 each 0  . folic acid (FOLVITE) 1 MG tablet Take 1 tablet (1 mg total) by mouth daily. (Patient not taking: Reported on 12/31/2014) 30 tablet 0  . hydrochlorothiazide (HYDRODIURIL) 25 MG tablet Take 1 tablet (25 mg total) by mouth daily. 30 tablet 0  . metoprolol (LOPRESSOR) 25 MG tablet Take 0.5 tablets (12.5 mg total) by mouth 2 (two) times daily. 60 tablet 0  . montelukast (SINGULAIR) 10 MG tablet Take 1 tablet (10 mg total) by mouth at bedtime. 30 tablet 0  . nicotine (NICODERM CQ - DOSED IN MG/24 HOURS) 21 mg/24hr patch Place 1 patch (21 mg total) onto the skin daily. 28 patch 0  . nitroGLYCERIN (NITROSTAT) 0.4 MG SL tablet Place 1 tablet (0.4 mg total) under the tongue every 5 (five) minutes as needed for chest pain (CP or SOB). 15 tablet 0  . thiamine 100 MG tablet Take 1 tablet (100 mg total) by mouth daily. (Patient not taking: Reported on 12/31/2014) 30 tablet 0  . tiotropium (SPIRIVA) 18 MCG inhalation capsule Place 1 capsule (18 mcg total) into inhaler and inhale daily. 30 capsule 0   No current facility-administered medications for this visit.    Allergies:   Review of patient's allergies indicates no known allergies.    Social History:  The patient  reports that he has been smoking Cigarettes.  He has a 9.5 pack-year smoking history.  He has never used smokeless tobacco. He reports that he drinks about 3.6 oz of alcohol per week. He reports that he uses illicit drugs (Cocaine).   Family History:  The patient's family history includes Allergies in his brother, mother, and sister; Asthma in his mother; Coronary artery disease in his father; Deep vein thrombosis in his brother; Heart attack in his father.    ROS:   Please see the history of present illness.   ROS    PHYSICAL EXAM: VS:  There were no vitals taken for this visit.    Wt Readings from Last 3 Encounters:  01/04/15 164 lb 3.2 oz (74.481 kg)  12/02/14 178 lb 9.6 oz (81.012 kg)  10/22/14 176 lb 12.8 oz (80.196 kg)     GEN: Well nourished, well developed, in  no acute distress HEENT: normal Neck: no JVD, no carotid bruits, no masses Cardiac:  Normal S1/S2, RRR; no murmur ,  no rubs or gallops, no edema  Respiratory:  clear to auscultation bilaterally, no wheezing, rhonchi or rales. GI: soft, nontender, nondistended, + BS MS: no deformity or atrophy Skin: warm and dry  Neuro:  CNs II-XII intact, Strength and sensation are intact Psych: Normal affect   EKG:  EKG is ordered today.  It demonstrates:      Recent Labs: 01/26/2014: Pro B Natriuretic peptide (BNP) 1619.0* 12/01/2014: TSH 0.548 01/01/2015: ALT 30; B Natriuretic Peptide 87.1 01/02/2015: Magnesium 2.2 01/04/2015: BUN 10; Creatinine, Ser 0.65; Hemoglobin 10.9*; Platelets 99*; Potassium 4.3; Sodium 138    Lipid Panel    Component Value Date/Time   CHOL 190 01/01/2015 0208   TRIG 60 01/01/2015 0208   HDL 62 01/01/2015 0208   CHOLHDL 3.1 01/01/2015 0208   VLDL 12 01/01/2015 0208   LDLCALC 116* 01/01/2015 0208      ASSESSMENT AND PLAN:  1. CAD:  Hx of NSTEMI tx with BMS to the Dx.  Recent LHC demonstrates occluded Dx stent with mild to mod non-obs disease elsewhere. EF is preserved.   2. Chronic Diastolic CHF:    3. HTN:    4. Hx of DVT/PE:  S/p IVC filter.  He has a hx of  non-adherence to medication.    5. HCV:    6. Hx of Polysubstance Abuse:    7. Tobacco Abuse:    8. COPD:       Medication Changes: Current medicines are reviewed at length with the patient today.  Concerns regarding medicines are as outlined above.  The following changes have been made:   Discontinued Medications   No medications on file   Modified Medications   No medications on file   New Prescriptions   No medications on file   Labs/ tests ordered today include:   No orders of the defined types were placed in this encounter.      Disposition:    FU with    Signed, Richardson Dopp, PA-C, MHS 01/18/2015 8:49 AM    [facility] [address], [place], [place]  [address] Phone: [phone]; Fax: [phone]    This encounter was created in error - please disregard.

## 2015-01-18 ENCOUNTER — Encounter: Payer: Medicare Other | Admitting: Physician Assistant

## 2015-01-20 ENCOUNTER — Encounter: Payer: Self-pay | Admitting: Physician Assistant

## 2015-01-25 DIAGNOSIS — F17220 Nicotine dependence, chewing tobacco, uncomplicated: Secondary | ICD-10-CM | POA: Diagnosis not present

## 2015-01-25 DIAGNOSIS — F17228 Nicotine dependence, chewing tobacco, with other nicotine-induced disorders: Secondary | ICD-10-CM | POA: Diagnosis not present

## 2015-01-25 DIAGNOSIS — J300 Vasomotor rhinitis: Secondary | ICD-10-CM | POA: Diagnosis not present

## 2015-01-25 DIAGNOSIS — R5382 Chronic fatigue, unspecified: Secondary | ICD-10-CM | POA: Diagnosis not present

## 2015-01-25 DIAGNOSIS — R531 Weakness: Secondary | ICD-10-CM | POA: Diagnosis not present

## 2015-01-25 DIAGNOSIS — B182 Chronic viral hepatitis C: Secondary | ICD-10-CM | POA: Diagnosis not present

## 2015-01-25 DIAGNOSIS — I2510 Atherosclerotic heart disease of native coronary artery without angina pectoris: Secondary | ICD-10-CM | POA: Diagnosis not present

## 2015-01-25 NOTE — Progress Notes (Signed)
Cardiology Office Note   Date:  01/25/2015   ID:  [name], DOB 02/22/1961, MRN [id number]  PCP:  [name], MD  Cardiologist:  Dr. Loralie Champagne   Electrophysiologist:  n/a  Chief Complaint  Patient presents with  . Hospitalization Follow-up    [place] >> s/p cardiac cath  . Coronary Artery Disease     History of Present Illness: [name] is a 54 y.o. male with a hx of DVT and PE s/p IVC filter in 2012, CAD s/p stent to D1 in 2015, COPD/asthma, diastolic HF, HTN, HCV, prior cocaine abuse, tobacco abuse. He was admitted in 7/15 with NSTEMI tx with BMS to the Dx. EF was 45-50% at that time. He has been non-adherent with medications and anticoagulation. He has been seen by our service multiple times in the hospital since that time without outpatient FU. FU echo in 11/15 demonstrated normal LVF.   Admitted 10/6-10/10 with chest pain. Troponin levels were neg. CTA was neg for PE. Inpatient Myoview was abnormal with anterior ischemia. Cardiac catheterization demonstrated an occluded diagonal stent and mild to moderate nausea to disease elsewhere. EF remained normal on echocardiogram. Medical therapy was recommended.    Studies/Reports Reviewed Today:  LHC 01/04/15 LAD: Proximal 50%, D1 stent occluded RI: 60% LCx: Ostial 20% RCA: Proximal 30%, mid 30% Normal LV function  1. Double vessel CAD  2. Moderate stenosis moderate caliber Ramus Intermediate branch, does not appear to be flow limiting.  3. Mild to moderate disease in the LAD, RCA and Circumflex.  4. Chronic total occlusion bare metal stent in Diagonal branch. 5. Low normal LV systolic function  Recommendations: Continue medical management of CAD. He admits today to continued tobacco abuse and medication non-compliance. He responded poorly to bare metal stenting in the past. I would maximize his anti-anginal medications and encourage tobacco cessation. He can be discharged home later today.  Follow up with Dr. Loralie Champagne following discharge.    Myoview 01/02/15 IMPRESSION: 1. Evidence of inducible ischemia in the mid to distal aspect of the anterior wall. This is suggestive of ischemia in the LAD territory. 2. Normal left ventricular wall motion. 3. Left ventricular ejection fraction 50% 4. Intermediate-risk stress test findings*.  Echo 01/02/15 Mild LVH, EF 55-60%, normal wall motion, grade 1 diastolic dysfunction, trivial TR, PASP normal   Past Medical History  Diagnosis Date  . Asthma   . DVT (deep venous thrombosis) ([place])     "I had ~ 10 in each leg"  . Pulmonary embolism ([place])     a. 2012 - s/p IVC filter;  b. prev on eliquis - noncompliant.  Marland Kitchen CAD (coronary artery disease)     a. 09/2013 NSTEMI/PCI: LM nl, LAD 40-50%, D1 100 (2.25 x 28 Vision BMS), LCX min irregs, RI 60-70, 30, RCA 40-50/50-52ms/p, EF 45-50%.  Marland Kitchen COPD (chronic obstructive pulmonary disease) ([place])   . History of pneumonia   . Sinus headache   . Osteoarthritis     a. hands and toes.  . Chronic diastolic CHF (congestive heart failure) ([place])     a. 09/2014 EF 45-50% by LV gram;  b. 01/2014 Echo: EF 55-60%, Gr 1 DD.  Marland Kitchen Essential hypertension   . Tobacco abuse   . Hepatitis C   . Cocaine abuse     Past Surgical History  Procedure Laterality Date  . Vena cava filter placement  " ~ 2012  . Coronary angioplasty with stent placement  10/21/13    BMS to D1  .  Left heart catheterization with coronary angiogram N/A 10/21/2013    Procedure: LEFT HEART CATHETERIZATION WITH CORONARY ANGIOGRAM;  Surgeon: Leonie Man, MD;  Location: [facility] CATH LAB;  Service: Cardiovascular;  Laterality: N/A;  . Cardiac catheterization N/A 01/04/2015    Procedure: Left Heart Cath and Coronary Angiography;  Surgeon: Burnell Blanks, MD;  Location: [facility] CV LAB;  Service: Cardiovascular;  Laterality: N/A;     Current Outpatient Prescriptions  Medication Sig Dispense Refill  . albuterol (PROVENTIL HFA;VENTOLIN  HFA) 108 (90 BASE) MCG/ACT inhaler Inhale 2 puffs into the lungs every 6 (six) hours as needed for wheezing or shortness of breath. 1 Inhaler 10  . albuterol (PROVENTIL) (2.5 MG/3ML) 0.083% nebulizer solution Take 3 mLs (2.5 mg total) by nebulization every 6 (six) hours as needed for wheezing or shortness of breath. 75 mL 12  . apixaban (ELIQUIS) 2.5 MG TABS tablet Take 1 tablet (2.5 mg total) by mouth 2 (two) times daily. 60 tablet 0  . diphenhydrAMINE (BENADRYL) 25 mg capsule Take 25 mg by mouth daily as needed for allergies.    . Fluticasone-Salmeterol (ADVAIR DISKUS) 500-50 MCG/DOSE AEPB Inhale 1 puff into the lungs 2 (two) times daily. 60 each 0  . folic acid (FOLVITE) 1 MG tablet Take 1 tablet (1 mg total) by mouth daily. (Patient not taking: Reported on 12/31/2014) 30 tablet 0  . hydrochlorothiazide (HYDRODIURIL) 25 MG tablet Take 1 tablet (25 mg total) by mouth daily. 30 tablet 0  . metoprolol (LOPRESSOR) 25 MG tablet Take 0.5 tablets (12.5 mg total) by mouth 2 (two) times daily. 60 tablet 0  . montelukast (SINGULAIR) 10 MG tablet Take 1 tablet (10 mg total) by mouth at bedtime. 30 tablet 0  . nicotine (NICODERM CQ - DOSED IN MG/24 HOURS) 21 mg/24hr patch Place 1 patch (21 mg total) onto the skin daily. 28 patch 0  . nitroGLYCERIN (NITROSTAT) 0.4 MG SL tablet Place 1 tablet (0.4 mg total) under the tongue every 5 (five) minutes as needed for chest pain (CP or SOB). 15 tablet 0  . thiamine 100 MG tablet Take 1 tablet (100 mg total) by mouth daily. (Patient not taking: Reported on 12/31/2014) 30 tablet 0  . tiotropium (SPIRIVA) 18 MCG inhalation capsule Place 1 capsule (18 mcg total) into inhaler and inhale daily. 30 capsule 0   No current facility-administered medications for this visit.    Allergies:   Review of patient's allergies indicates no known allergies.    Social History:   Social History   Social History  . Marital Status: Single    Spouse Name: N/A  . Number of Children: N/A   . Years of Education: N/A   Occupational History  . unemployed    Social History Main Topics  . Smoking status: Current Every Day Smoker -- 0.25 packs/day for 38 years    Types: Cigarettes  . Smokeless tobacco: Never Used  . Alcohol Use: 3.6 oz/week    6 Cans of beer per week     Comment: 01/26/2014 "drink  2 40oz beers weekly"  . Drug Use: Yes    Special: Cocaine     Comment: a. last use was 8/16 - cocaine  . Sexual Activity: Yes   Other Topics Concern  . Not on file   Social History Narrative   Lives in [place].     Family History:   Family History  Problem Relation Age of Onset  . Asthma Mother   . Allergies Mother   .  Allergies Sister   . Allergies Brother   . Deep vein thrombosis Brother     two brothers with recurrent DVT  . Heart attack Father     a.60s b. deceased in his 51s  . Coronary artery disease Father       ROS:   Please see the history of present illness.   ROS    PHYSICAL EXAM: VS:  There were no vitals taken for this visit.    Wt Readings from Last 3 Encounters:  01/04/15 164 lb 3.2 oz (74.481 kg)  12/02/14 178 lb 9.6 oz (81.012 kg)  10/22/14 176 lb 12.8 oz (80.196 kg)     GEN: Well nourished, well developed, in no acute distress HEENT: normal Neck: no JVD, no carotid bruits, no masses Cardiac:  Normal S1/S2, RRR; no murmur ,  no rubs or gallops, no edema  Respiratory:  clear to auscultation bilaterally, no wheezing, rhonchi or rales. GI: soft, nontender, nondistended, + BS MS: no deformity or atrophy Skin: warm and dry  Neuro:  CNs II-XII intact, Strength and sensation are intact Psych: Normal affect   EKG:  EKG is ordered today.  It demonstrates:      Recent Labs: 01/26/2014: Pro B Natriuretic peptide (BNP) 1619.0* 12/01/2014: TSH 0.548 01/01/2015: ALT 30; B Natriuretic Peptide 87.1 01/02/2015: Magnesium 2.2 01/04/2015: BUN 10; Creatinine, Ser 0.65; Hemoglobin 10.9*; Platelets 99*; Potassium 4.3; Sodium 138    Lipid Panel      Component Value Date/Time   CHOL 190 01/01/2015 0208   TRIG 60 01/01/2015 0208   HDL 62 01/01/2015 0208   CHOLHDL 3.1 01/01/2015 0208   VLDL 12 01/01/2015 0208   LDLCALC 116* 01/01/2015 0208      ASSESSMENT AND PLAN:  1. CAD: Hx of NSTEMI tx with BMS to the Dx. Recent LHC demonstrates occluded Dx stent with mild to mod non-obs disease elsewhere. EF is preserved.   2. Chronic Diastolic CHF:   3. HTN:   4. Hx of DVT/PE: S/p IVC filter. He has a hx of non-adherence to medication.   5. HCV:   6. Hx of Polysubstance Abuse:   7. Tobacco Abuse:   8. COPD:    Medication Changes: Current medicines are reviewed at length with the patient today.  Concerns regarding medicines are as outlined above.  The following changes have been made:   Discontinued Medications   No medications on file   Modified Medications   No medications on file   New Prescriptions   No medications on file   Labs/ tests ordered today include:   No orders of the defined types were placed in this encounter.      Disposition:    FU with     Signed, Richardson Dopp, PA-C, MHS 01/25/2015 9:06 AM    [facility], [place], [place]  [address] Phone: [phone]; Fax: [phone]    This encounter was created in error - please disregard.

## 2015-01-26 ENCOUNTER — Encounter: Payer: Medicare Other | Admitting: Physician Assistant

## 2015-01-27 ENCOUNTER — Encounter: Payer: Self-pay | Admitting: Physician Assistant

## 2015-02-09 DIAGNOSIS — R5382 Chronic fatigue, unspecified: Secondary | ICD-10-CM | POA: Diagnosis not present

## 2015-02-09 DIAGNOSIS — J45909 Unspecified asthma, uncomplicated: Secondary | ICD-10-CM | POA: Diagnosis not present

## 2015-02-09 DIAGNOSIS — B182 Chronic viral hepatitis C: Secondary | ICD-10-CM | POA: Diagnosis not present

## 2015-02-09 DIAGNOSIS — I10 Essential (primary) hypertension: Secondary | ICD-10-CM | POA: Diagnosis not present

## 2015-02-09 DIAGNOSIS — R5383 Other fatigue: Secondary | ICD-10-CM | POA: Diagnosis not present

## 2015-02-12 ENCOUNTER — Other Ambulatory Visit: Payer: Medicare Other

## 2015-02-12 DIAGNOSIS — B182 Chronic viral hepatitis C: Secondary | ICD-10-CM | POA: Diagnosis not present

## 2015-02-12 LAB — HEPATITIS B SURFACE ANTIBODY,QUALITATIVE: HEP B S AB: NEGATIVE

## 2015-02-12 LAB — HEPATITIS B CORE ANTIBODY, TOTAL: Hep B Core Total Ab: NONREACTIVE

## 2015-02-12 LAB — IRON: Iron: 156 ug/dL (ref 50–180)

## 2015-02-12 LAB — HEPATITIS A ANTIBODY, TOTAL: HEP A TOTAL AB: NONREACTIVE

## 2015-02-12 LAB — HEPATITIS B SURFACE ANTIGEN: HEP B S AG: NEGATIVE

## 2015-02-12 NOTE — Telephone Encounter (Signed)
PCP's contact -Lyzeth informed-LVM

## 2015-02-12 NOTE — Telephone Encounter (Signed)
Pt came in today to have required labs drawn and was gvien an appmt for Dr. Linus Salmons on 02/24/2015 @ 345

## 2015-02-15 LAB — ANA: ANA: NEGATIVE

## 2015-02-17 LAB — HCV RNA, QUANT REAL-TIME PCR W/REFLEX
HCV RNA, PCR, QN (Log): 6.37 LogIU/mL — ABNORMAL HIGH
HCV RNA, PCR, QN: 2370000 [IU]/mL — AB

## 2015-02-17 LAB — HCV RNA,LIPA RFLX NS5A DRUG RESIST

## 2015-02-24 ENCOUNTER — Ambulatory Visit (INDEPENDENT_AMBULATORY_CARE_PROVIDER_SITE_OTHER): Payer: Medicare Other | Admitting: Internal Medicine

## 2015-02-24 ENCOUNTER — Encounter: Payer: Self-pay | Admitting: Internal Medicine

## 2015-02-24 VITALS — BP 126/91 | HR 112 | Temp 97.9°F | Ht 74.0 in | Wt 172.0 lb

## 2015-02-24 DIAGNOSIS — F1410 Cocaine abuse, uncomplicated: Secondary | ICD-10-CM | POA: Diagnosis not present

## 2015-02-24 DIAGNOSIS — B182 Chronic viral hepatitis C: Secondary | ICD-10-CM | POA: Diagnosis present

## 2015-02-24 DIAGNOSIS — Z23 Encounter for immunization: Secondary | ICD-10-CM | POA: Diagnosis not present

## 2015-02-24 NOTE — Addendum Note (Signed)
Addended by: Myrtis Hopping A on: 02/24/2015 04:27 PM   Modules accepted: Orders

## 2015-02-24 NOTE — Patient Instructions (Signed)
Date 02/24/2015  Dear [name], As discussed in the [facility], your hepatitis C therapy will include the following medications:          Harvoni 90mg /400mg  tablet:           Take 1 tablet by mouth once daily   Please note that ALL MEDICATIONS WILL START ON THE SAME DATE for a total of 12 weeks. ---------------------------------------------------------------- Your HCV Treatment Start Date: TBA   Your HCV genotype:  1b    Liver Fibrosis: TBD    ---------------------------------------------------------------- YOUR PHARMACY CONTACT:   [place] [facility] [place] Phone: [phone] Hours: Monday to Friday 7:30 am to 6:00 pm   Please always contact your pharmacy at least 3-4 business days before you run out of medications to ensure your next month's medication is ready or 1 week prior to running out if you receive it by mail.  Remember, each prescription is for 28 days. ---------------------------------------------------------------- GENERAL NOTES REGARDING YOUR HEPATITIS C MEDICATION:  SOFOSBUVIR/LEDIPASVIR (HARVONI): - Harvoni tablet is taken daily with OR without food. - The tablets are orange. - The tablets should be stored at room temperature.  - Acid reducing agents such as H2 blockers (ie. Pepcid (famotidine), Zantac (ranitidine), Tagamet (cimetidine), Axid (nizatidine) and proton pump inhibitors (ie. Prilosec (omeprazole), Protonix (pantoprazole), Nexium (esomeprazole), or Aciphex (rabeprazole)) can decrease effectiveness of Harvoni. Do not take until you have discussed with a health care provider.    -Antacids that contain magnesium and/or aluminum hydroxide (ie. Milk of Magensia, Rolaids, Gaviscon, Maalox, Mylanta, an dArthritis Pain Formula)can reduce absorption of Harvoni, so take them at least 4 hours before or after Harvoni.  -Calcium carbonate (calcium supplements or antacids such as Tums, Caltrate,  Os-Cal)needs to be taken at least 4 hours hours before or after Harvoni.  -St. John's wort or any products that contain St. John's wort like some herbal supplements  Please inform the office prior to starting any of these medications.  - The common side effects associated with Harvoni include:      1. Fatigue      2. Headache      3. Nausea      4. Diarrhea      5. Insomnia  Please note that this only lists the most common side effects and is NOT a comprehensive list of the potential side effects of these medications. For more information, please review the drug information sheets that come with your medication package from the pharmacy.  ---------------------------------------------------------------- GENERAL HELPFUL HINTS ON HCV THERAPY: 1. Stay well-hydrated. 2. Notify the [facility] of any changes in your other over-the-counter/herbal or prescription medications. 3. If you miss a dose of your medication, take the missed dose as soon as you remember. Return to your regular time/dose schedule the next day.  4.  Do not stop taking your medications without first talking with your healthcare provider. 5.  You may take Tylenol (acetaminophen), as long as the dose is less than 2000 mg (OR no more than 4 tablets of the Tylenol Extra Strengths 500mg  tablet) in 24 hours. 6.  You will see our pharmacist-specialist within the first 2 weeks of starting your medication. 7.  You will need to obtain routine labs around week 4 and12 weeks after starting and then 3 to 6 months after finishing Harvoni.    Scharlene Gloss, [place] [facility] [place],  [place]  [address] [phone]

## 2015-02-24 NOTE — Progress Notes (Signed)
[facility] for Infectious Disease   CC: consideration for treatment for chronic hepatitis C  HPI:  +[name] is a 54 y.o. male who presents for initial evaluation and management of chronic hepatitis C.  Patient tested positive this year during hospitalization. Hepatitis C-associated risk factors present are: IV drug abuse (details: over 10 years ago). Patient denies history of blood transfusion, multiple sexual partners, renal dialysis, sexual contact with person with liver disease, tattoos. Patient has had other studies performed. Results: hepatitis C RNA by PCR, result: positive. Patient has not had prior treatment for Hepatitis C. Patient does not have a past history of liver disease. Patient does not have a family history of liver disease. Patient does not  have associated signs or symptoms related to liver disease.  Labs reviewed and confirm chronic hepatitis C with a positive viral load.   Records reviewed from recent hospitalization for unstable angina and required stent.  Also noted to be cocaine positive as well as cocaine positive 4 months ago.  He states he only used it 'once'.  Denies other significant drug use.       Patient does not have documented immunity to Hepatitis A. Patient does not have documented immunity to Hepatitis B.    Review of Systems:   Constitutional: negative for fatigue and malaise Gastrointestinal: negative for diarrhea and constipation Musculoskeletal: negative for myalgias and arthralgias All other systems reviewed and are negative      Past Medical History  Diagnosis Date  . Asthma   . DVT (deep venous thrombosis) ([place])     "I had ~ 10 in each leg"  . Pulmonary embolism ([place])     a. 2012 - s/p IVC filter;  b. prev on eliquis - noncompliant.  Marland Kitchen CAD (coronary artery disease)     a. 09/2013 NSTEMI/PCI: LM nl, LAD 40-50%, D1 100 (2.25 x 28 Vision BMS), LCX min irregs, RI 60-70, 30, RCA 40-50/50-86ms/p, EF 45-50%.  Marland Kitchen COPD (chronic  obstructive pulmonary disease) ([place])   . History of pneumonia   . Sinus headache   . Osteoarthritis     a. hands and toes.  . Chronic diastolic CHF (congestive heart failure) ([place])     a. 09/2014 EF 45-50% by LV gram;  b. 01/2014 Echo: EF 55-60%, Gr 1 DD.  Marland Kitchen Essential hypertension   . Tobacco abuse   . Hepatitis C   . Cocaine abuse     Prior to Admission medications   Medication Sig Start Date End Date Taking? Authorizing Provider  albuterol (PROVENTIL HFA;VENTOLIN HFA) 108 (90 BASE) MCG/ACT inhaler Inhale 2 puffs into the lungs every 6 (six) hours as needed for wheezing or shortness of breath. 10/22/14  Yes Charlynne Cousins, MD  albuterol (PROVENTIL) (2.5 MG/3ML) 0.083% nebulizer solution Take 3 mLs (2.5 mg total) by nebulization every 6 (six) hours as needed for wheezing or shortness of breath. 12/02/14  Yes Thurnell Lose, MD  apixaban (ELIQUIS) 2.5 MG TABS tablet Take 1 tablet (2.5 mg total) by mouth 2 (two) times daily. 01/04/15  Yes Eugenie Filler, MD  diphenhydrAMINE (BENADRYL) 25 mg capsule Take 25 mg by mouth daily as needed for allergies.   Yes Historical Provider, MD  Fluticasone-Salmeterol (ADVAIR DISKUS) 500-50 MCG/DOSE AEPB Inhale 1 puff into the lungs 2 (two) times daily. 01/04/15  Yes Eugenie Filler, MD  hydrochlorothiazide (HYDRODIURIL) 25 MG tablet Take 1 tablet (25 mg total) by mouth daily. 01/04/15  Yes Eugenie Filler, MD  metoprolol (LOPRESSOR) 25 MG tablet Take 0.5 tablets (12.5 mg total) by mouth 2 (two) times daily. 01/04/15  Yes Eugenie Filler, MD  montelukast (SINGULAIR) 10 MG tablet Take 1 tablet (10 mg total) by mouth at bedtime. 01/04/15  Yes Eugenie Filler, MD  tiotropium (SPIRIVA) 18 MCG inhalation capsule Place 1 capsule (18 mcg total) into inhaler and inhale daily. 01/04/15  Yes Eugenie Filler, MD  nitroGLYCERIN (NITROSTAT) 0.4 MG SL tablet Place 1 tablet (0.4 mg total) under the tongue every 5 (five) minutes as needed for chest pain (CP or  SOB). Patient not taking: Reported on 02/24/2015 01/04/15   Eugenie Filler, MD    No Known Allergies  Social History  Substance Use Topics  . Smoking status: Current Every Day Smoker -- 0.25 packs/day for 38 years    Types: Cigarettes  . Smokeless tobacco: Never Used  . Alcohol Use: 3.6 oz/week    6 Cans of beer per week     Comment:  "drink  2 40oz beers weekly"    Family History  Problem Relation Age of Onset  . Asthma Mother   . Allergies Mother   . Allergies Sister   . Allergies Brother   . Deep vein thrombosis Brother     two brothers with recurrent DVT  . Heart attack Father     a.60s b. deceased in his 93s  . Coronary artery disease Father   no liver cirrhosis, no liver cancer   Objective:  Constitutional: in no apparent distress and alert,  Filed Vitals:   02/24/15 1547  BP: 126/91  Pulse: 112  Temp: 97.9 F (36.6 C)   Eyes: anicteric Cardiovascular: Cor RRR and No murmurs Respiratory: CTA B; normal respiratory effort Gastrointestinal: Bowel sounds are normal, liver is not enlarged, spleen is not enlarged Musculoskeletal: peripheral pulses normal, no pedal edema, no clubbing or cyanosis Skin: negative for - jaundice, spider hemangioma, telangiectasia, palmar erythema, ecchymosis and atrophy; no porphyria cutanea tarda Lymphatic: no cervical lymphadenopathy   Laboratory Genotype: No results found for: HCVGENOTYPE HCV viral load:  Lab Results  Component Value Date   HCVQUANT [id number]* 06/15/2014   Lab Results  Component Value Date   WBC 3.0* 01/04/2015   HGB 10.9* 01/04/2015   HCT 32.9* 01/04/2015   MCV 95.9 01/04/2015   PLT 99* 01/04/2015    Lab Results  Component Value Date   CREATININE 0.65 01/04/2015   BUN 10 01/04/2015   NA 138 01/04/2015   K 4.3 01/04/2015   CL 105 01/04/2015   CO2 26 01/04/2015    Lab Results  Component Value Date   ALT 30 01/01/2015   AST 51* 01/01/2015   ALKPHOS 67 01/01/2015     Labs and history reviewed  and show CHILD-PUGH A  5-6 points: Child class A 7-9 points: Child class B 10-15 points: Child class C  Lab Results  Component Value Date   INR 1.04 01/01/2015   BILITOT 0.6 01/01/2015   ALBUMIN 2.6* 01/01/2015     Assessment: New Patient with Chronic Hepatitis C genotype 1b, untreated.  I discussed with the patient the lab findings that confirm chronic hepatitis C as well as the natural history and progression of disease including about 30% of people who develop cirrhosis of the liver if left untreated and once cirrhosis is established there is a 2-7% risk per year of liver cancer and liver failure.  I discussed the importance of treatment and benefits in reducing the risk, even  if significant liver fibrosis exists.   Plan: 1) Patient counseled extensively on limiting acetaminophen to no more than 2 grams daily, avoidance of alcohol. 2) Transmission discussed with patient including sexual transmission, sharing razors and toothbrush.   3) Will need referral to gastroenterology if concern for cirrhosis 4) Will need referral for substance abuse counseling: Yes.  ; once he is in counseling, will get the process started.  I would just like him to discuss his drug use, particularly since he just had an MI.  Further work up to include urine drug screen  Yes.    I will place a standing order 5) Will prescribe Harvoni for 12 weeks after elastography and after he has seen our SA counselor  6) Hepatitis A vaccine Yes.   7) Hepatitis B vaccine Yes.   8) Pneumovax vaccine if concern for cirrhosis 9) Further work up to include liver staging with elastography 10) will follow up after elastography

## 2015-03-12 ENCOUNTER — Ambulatory Visit: Payer: Medicare Other | Admitting: *Deleted

## 2015-03-24 ENCOUNTER — Ambulatory Visit (HOSPITAL_COMMUNITY)
Admission: RE | Admit: 2015-03-24 | Discharge: 2015-03-24 | Disposition: A | Discharge status: Home / Self Care | Payer: Medicare Other | Source: Ambulatory Visit | Attending: Internal Medicine | Admitting: Internal Medicine

## 2015-03-24 ENCOUNTER — Ambulatory Visit: Payer: Medicare Other | Admitting: *Deleted

## 2015-03-24 DIAGNOSIS — R932 Abnormal findings on diagnostic imaging of liver and biliary tract: Secondary | ICD-10-CM | POA: Diagnosis not present

## 2015-03-24 DIAGNOSIS — N289 Disorder of kidney and ureter, unspecified: Secondary | ICD-10-CM | POA: Diagnosis not present

## 2015-03-24 DIAGNOSIS — B1920 Unspecified viral hepatitis C without hepatic coma: Secondary | ICD-10-CM | POA: Diagnosis not present

## 2015-03-24 DIAGNOSIS — B182 Chronic viral hepatitis C: Secondary | ICD-10-CM | POA: Insufficient documentation

## 2015-03-24 DIAGNOSIS — F1410 Cocaine abuse, uncomplicated: Secondary | ICD-10-CM

## 2015-03-24 DIAGNOSIS — F1010 Alcohol abuse, uncomplicated: Secondary | ICD-10-CM

## 2015-03-24 NOTE — BH Specialist Note (Signed)
[name] was present for his scheduled appointment today.  Client was oriented times four with good affect and dress.  Client was alert and talkative.  Client indicated that he occasionally snorts cocaine and drinks alcohol.  Counselor educated client on using alcohol once diagnosed with Hep C.  Counselor discussed with client the idea of sobriety and how much assistance he thought he would need to stop his substance abuse.  Client was cooperative and shared that he would do whatever it took to get rid of the Hep C. Counselor talked with client about the upcoming holidays and how best to avoid high risk situations.  Client stated that he understood but would probably start his sobriety after the first of the year as he knew he would most likely drink on New Years Eve. Another appointment was made with client for two weeks out.  Rolena Infante, MA Alcohol and Drug Services

## 2015-04-07 ENCOUNTER — Telehealth: Payer: Self-pay | Admitting: Pharmacy Technician

## 2015-04-07 ENCOUNTER — Other Ambulatory Visit: Payer: Self-pay | Admitting: Internal Medicine

## 2015-04-07 MED ORDER — LEDIPASVIR-SOFOSBUVIR 90-400 MG PO TABS: 1.0000 | ORAL_TABLET | Freq: Every day | ORAL | Status: DC

## 2015-04-07 MED FILL — *HARVONI 90-400 MG TABLET: 90-400 | 28 days supply | Qty: 28 | Fill #0

## 2015-04-08 ENCOUNTER — Encounter: Payer: Self-pay | Admitting: Internal Medicine

## 2015-04-08 ENCOUNTER — Encounter: Payer: Self-pay | Admitting: Pharmacy Technician

## 2015-04-08 ENCOUNTER — Ambulatory Visit (INDEPENDENT_AMBULATORY_CARE_PROVIDER_SITE_OTHER): Payer: Medicare Other | Admitting: Internal Medicine

## 2015-04-08 VITALS — BP 115/78 | HR 91 | Temp 98.3°F | Ht 74.0 in | Wt 171.0 lb

## 2015-04-08 DIAGNOSIS — Z23 Encounter for immunization: Secondary | ICD-10-CM | POA: Diagnosis not present

## 2015-04-08 DIAGNOSIS — K7400 Hepatic fibrosis, unspecified: Secondary | ICD-10-CM

## 2015-04-08 DIAGNOSIS — B182 Chronic viral hepatitis C: Secondary | ICD-10-CM

## 2015-04-08 NOTE — Assessment & Plan Note (Signed)
Discussed results and need to stop drinking.  He is pleased with his progress on this.

## 2015-04-08 NOTE — Progress Notes (Signed)
   Subjective:    Patient ID: Romone Czaplewski, male    DOB: Nov 10, 1960, 55 y.o.   MRN: [id number]  HPI Here for follow up of HCV.  Genotype 1b, viral load 2.3 million, elastography with F2/3.  Seeing our counselor about alcohol use and is pleased that he only had about 2 shots for the new year, and otherwise is not drinking.  Results of elastography discussed and has some fibrosis.  Just approved for harvoni and is waiting at the pharmacy for pick up.     Review of Systems  Constitutional: Positive for fatigue.  Gastrointestinal: Negative for nausea and diarrhea.  Skin: Negative for rash.  Neurological: Negative for dizziness and light-headedness.       Objective:   Physical Exam  Constitutional: He appears well-developed and well-nourished. No distress.  Eyes: No scleral icterus.  Cardiovascular: Normal rate, regular rhythm and normal heart sounds.   No murmur heard. Pulmonary/Chest: Effort normal and breath sounds normal. No respiratory distress.  Skin: No rash noted.          Assessment & Plan:

## 2015-04-08 NOTE — Addendum Note (Signed)
Addended by: Myrtis Hopping A on: 04/08/2015 10:55 AM   Modules accepted: Orders

## 2015-04-08 NOTE — Assessment & Plan Note (Signed)
He will pick up today, start and see our pharmacist next week and labs scheduled for 4 weeks and follow up with me in march

## 2015-04-15 ENCOUNTER — Ambulatory Visit: Payer: Medicare Other

## 2015-04-29 MED FILL — *HARVONI 90-400 MG TABLET: 90-400 | 28 days supply | Qty: 28 | Fill #1

## 2015-05-10 ENCOUNTER — Other Ambulatory Visit: Payer: Medicare Other

## 2015-05-10 ENCOUNTER — Ambulatory Visit: Payer: Medicare Other | Admitting: *Deleted

## 2015-05-10 ENCOUNTER — Ambulatory Visit: Payer: Medicare Other | Admitting: Pharmacist Clinician (PhC)/ Clinical Pharmacy Specialist

## 2015-05-10 DIAGNOSIS — F1020 Alcohol dependence, uncomplicated: Secondary | ICD-10-CM

## 2015-05-10 DIAGNOSIS — B182 Chronic viral hepatitis C: Secondary | ICD-10-CM | POA: Diagnosis not present

## 2015-05-10 NOTE — Progress Notes (Signed)
Patient ID: [name], male   DOB: 30-Apr-1960, 55 y.o.   MRN: [id number] HPI: [name] is a 55 y.o. male who is here for his hep C follow up.   No results found for: HCVGENOTYPE, HEPCGENOTYPE  Allergies: No Known Allergies  Vitals:    Past Medical History: Past Medical History  Diagnosis Date  . Asthma   . DVT (deep venous thrombosis) ([place])     "I had ~ 10 in each leg"  . Pulmonary embolism ([place])     a. 2012 - s/p IVC filter;  b. prev on eliquis - noncompliant.  Marland Kitchen CAD (coronary artery disease)     a. 09/2013 NSTEMI/PCI: LM nl, LAD 40-50%, D1 100 (2.25 x 28 Vision BMS), LCX min irregs, RI 60-70, 30, RCA 40-50/50-31ms/p, EF 45-50%.  Marland Kitchen COPD (chronic obstructive pulmonary disease) ([place])   . History of pneumonia   . Sinus headache   . Osteoarthritis     a. hands and toes.  . Chronic diastolic CHF (congestive heart failure) ([place])     a. 09/2014 EF 45-50% by LV gram;  b. 01/2014 Echo: EF 55-60%, Gr 1 DD.  Marland Kitchen Essential hypertension   . Tobacco abuse   . Hepatitis C   . Cocaine abuse     Social History: Social History   Social History  . Marital Status: Single    Spouse Name: N/A  . Number of Children: N/A  . Years of Education: N/A   Occupational History  . unemployed    Social History Main Topics  . Smoking status: Current Every Day Smoker -- 0.25 packs/day for 38 years    Types: Cigarettes  . Smokeless tobacco: Never Used  . Alcohol Use: 3.6 oz/week    6 Cans of beer per week     Comment:  "drink  2 40oz beers weekly"  . Drug Use: No     Comment: a. last use was 8/16 - cocaine  . Sexual Activity: Yes   Other Topics Concern  . Not on file   Social History Narrative   Lives in [place].    Labs: HEP B S AB (no units)  Date Value  02/12/2015 NEG   HEPATITIS B SURFACE AG (no units)  Date Value  02/12/2015 NEGATIVE   HCV AB (no units)  Date Value  06/15/2014 Reactive*    No results found for: HCVGENOTYPE, HEPCGENOTYPE  Hepatitis C RNA  quantitative Latest Ref Rng 06/15/2014  HCV Quantitative <15 IU/mL [id number](H)  HCV Quantitative Log <1.18 log 10 6.47(H)    AST (U/L)  Date Value  01/01/2015 51*  10/21/2014 42*  06/15/2014 23   ALT (U/L)  Date Value  01/01/2015 30  10/21/2014 29  06/15/2014 24   INR (no units)  Date Value  01/01/2015 1.04  03/25/2014 0.99  03/25/2013 0.94    CrCl: CrCl cannot be calculated (Unknown ideal weight.).  Fibrosis Score: F2/3 as assessed by ARFI  Child-Pugh Score: Class A  Previous Treatment Regimen: Naive  Assessment: [name] is here for a pharmacy follow up after his appt with Leveda Anna. He stated that he has been off of ETOH for a bit now. He has not missed any doses of Harvoni. Tolerating very well. He was asking about the ETOH issue and I told him to avoid if possible. Leveda Anna is handling that issue. He is about a month out now so we are going to get a VL today. His schedule are set up for further follow up.  Recommendations:  Cont Harvoni 1 PO qday VL today F/u Dr. Linus Salmons in March  Pham, Minh [place], [place].D., BCPS, AAHIVP Clinical Infectious [facility] 05/10/2015, 11:16 AM

## 2015-05-10 NOTE — BH Specialist Note (Signed)
[name] was present for his scheduled appointment today with counselor.  Patient was oriented times four with good affect and dress. Patient was alert and talkative.  Patient indicated that he was doing fine and didn't feel he needed to visit with counselor any longer.  Patient shared that he occasionally drinks some alcohol but not much at all.  Counselor encouraged patient to consider not drinking at all due to his Hep C. diagnosis.  Patient reported that he hardly ever drinks now and if he does it is usually just one beer.  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  Patient shared that despite the lack of money he makes he does feel that getting out of the hose to do something takes his mind off drinking. Counselor recommended patient to meet in a month to check on him. Patient agreed and made another appointment for a month out.  Rolena Infante, MA Alcohol and Drug Services/[facility]

## 2015-05-11 LAB — HEPATITIS C RNA QUANTITATIVE: HCV QUANT: NOT DETECTED [IU]/mL (ref <15)

## 2015-05-11 NOTE — Telephone Encounter (Signed)
Attempted several times to call and let [name] know Hep C med is available for pick up.  Could not reach or leave VM.

## 2015-05-18 ENCOUNTER — Other Ambulatory Visit: Payer: Medicare Other

## 2015-05-26 MED FILL — *HARVONI 90-400 MG TABLET: 90-400 | 28 days supply | Qty: 28 | Fill #2

## 2015-06-01 ENCOUNTER — Ambulatory Visit: Payer: Medicare Other | Admitting: Internal Medicine

## 2015-06-03 ENCOUNTER — Encounter (HOSPITAL_COMMUNITY): Payer: Self-pay | Admitting: Emergency Medicine

## 2015-06-03 ENCOUNTER — Emergency Department (HOSPITAL_COMMUNITY): Payer: Medicare Other

## 2015-06-03 ENCOUNTER — Inpatient Hospital Stay (HOSPITAL_COMMUNITY)
Admission: EM | Admit: 2015-06-03 | Discharge: 2015-06-08 | DRG: 190 | Disposition: A | Discharge status: Home Health | Payer: Medicare Other | Attending: Internal Medicine | Admitting: Internal Medicine

## 2015-06-03 DIAGNOSIS — J441 Chronic obstructive pulmonary disease with (acute) exacerbation: Secondary | ICD-10-CM | POA: Diagnosis not present

## 2015-06-03 DIAGNOSIS — Z87898 Personal history of other specified conditions: Secondary | ICD-10-CM | POA: Diagnosis not present

## 2015-06-03 DIAGNOSIS — Z86711 Personal history of pulmonary embolism: Secondary | ICD-10-CM | POA: Diagnosis not present

## 2015-06-03 DIAGNOSIS — J9601 Acute respiratory failure with hypoxia: Secondary | ICD-10-CM | POA: Diagnosis present

## 2015-06-03 DIAGNOSIS — Z91199 Patient's noncompliance with other medical treatment and regimen due to unspecified reason: Secondary | ICD-10-CM

## 2015-06-03 DIAGNOSIS — N179 Acute kidney failure, unspecified: Secondary | ICD-10-CM | POA: Diagnosis present

## 2015-06-03 DIAGNOSIS — Z7951 Long term (current) use of inhaled steroids: Secondary | ICD-10-CM

## 2015-06-03 DIAGNOSIS — Z95818 Presence of other cardiac implants and grafts: Secondary | ICD-10-CM

## 2015-06-03 DIAGNOSIS — E440 Moderate protein-calorie malnutrition: Secondary | ICD-10-CM | POA: Insufficient documentation

## 2015-06-03 DIAGNOSIS — I2510 Atherosclerotic heart disease of native coronary artery without angina pectoris: Secondary | ICD-10-CM | POA: Diagnosis present

## 2015-06-03 DIAGNOSIS — Z955 Presence of coronary angioplasty implant and graft: Secondary | ICD-10-CM

## 2015-06-03 DIAGNOSIS — I5032 Chronic diastolic (congestive) heart failure: Secondary | ICD-10-CM | POA: Diagnosis present

## 2015-06-03 DIAGNOSIS — R0602 Shortness of breath: Secondary | ICD-10-CM

## 2015-06-03 DIAGNOSIS — Z7901 Long term (current) use of anticoagulants: Secondary | ICD-10-CM | POA: Diagnosis not present

## 2015-06-03 DIAGNOSIS — Z8679 Personal history of other diseases of the circulatory system: Secondary | ICD-10-CM

## 2015-06-03 DIAGNOSIS — Z9119 Patient's noncompliance with other medical treatment and regimen: Secondary | ICD-10-CM

## 2015-06-03 DIAGNOSIS — F17210 Nicotine dependence, cigarettes, uncomplicated: Secondary | ICD-10-CM | POA: Diagnosis present

## 2015-06-03 DIAGNOSIS — Z86718 Personal history of other venous thrombosis and embolism: Secondary | ICD-10-CM | POA: Diagnosis not present

## 2015-06-03 DIAGNOSIS — Z8249 Family history of ischemic heart disease and other diseases of the circulatory system: Secondary | ICD-10-CM

## 2015-06-03 DIAGNOSIS — B182 Chronic viral hepatitis C: Secondary | ICD-10-CM | POA: Diagnosis present

## 2015-06-03 DIAGNOSIS — R069 Unspecified abnormalities of breathing: Secondary | ICD-10-CM | POA: Diagnosis not present

## 2015-06-03 DIAGNOSIS — I10 Essential (primary) hypertension: Secondary | ICD-10-CM | POA: Diagnosis not present

## 2015-06-03 DIAGNOSIS — I110 Hypertensive heart disease with heart failure: Secondary | ICD-10-CM | POA: Diagnosis present

## 2015-06-03 DIAGNOSIS — Z6820 Body mass index (BMI) 20.0-20.9, adult: Secondary | ICD-10-CM

## 2015-06-03 DIAGNOSIS — Z79899 Other long term (current) drug therapy: Secondary | ICD-10-CM

## 2015-06-03 DIAGNOSIS — J9621 Acute and chronic respiratory failure with hypoxia: Secondary | ICD-10-CM | POA: Diagnosis present

## 2015-06-03 DIAGNOSIS — J9600 Acute respiratory failure, unspecified whether with hypoxia or hypercapnia: Secondary | ICD-10-CM | POA: Diagnosis present

## 2015-06-03 LAB — CBC WITH DIFFERENTIAL/PLATELET
BASOS ABS: 0.1 10*3/uL (ref 0.0–0.1)
Basophils Relative: 1 %
Eosinophils Absolute: 1.6 10*3/uL — ABNORMAL HIGH (ref 0.0–0.7)
Eosinophils Relative: 30 %
HCT: 39.7 % (ref 39.0–52.0)
Hemoglobin: 13.7 g/dL (ref 13.0–17.0)
LYMPHS ABS: 1.8 10*3/uL (ref 0.7–4.0)
Lymphocytes Relative: 35 %
MCH: 31.3 pg (ref 26.0–34.0)
MCHC: 34.5 g/dL (ref 30.0–36.0)
MCV: 90.6 fL (ref 78.0–100.0)
MONO ABS: 0.6 10*3/uL (ref 0.1–1.0)
Monocytes Relative: 11 %
Neutro Abs: 1.2 10*3/uL — ABNORMAL LOW (ref 1.7–7.7)
Neutrophils Relative %: 23 %
PLATELETS: 160 10*3/uL (ref 150–400)
RBC: 4.38 MIL/uL (ref 4.22–5.81)
RDW: 12.5 % (ref 11.5–15.5)
WBC: 5.3 10*3/uL (ref 4.0–10.5)

## 2015-06-03 LAB — BASIC METABOLIC PANEL
Anion gap: 11 (ref 5–15)
BUN: 9 mg/dL (ref 6–20)
CALCIUM: 9.7 mg/dL (ref 8.9–10.3)
CO2: 23 mmol/L (ref 22–32)
CREATININE: 0.99 mg/dL (ref 0.61–1.24)
Chloride: 104 mmol/L (ref 101–111)
GFR calc Af Amer: >60 mL/min (ref >60)
GFR calc non Af Amer: >60 mL/min (ref >60)
GLUCOSE: 109 mg/dL — AB (ref 65–99)
Potassium: 3.6 mmol/L (ref 3.5–5.1)
Sodium: 138 mmol/L (ref 135–145)

## 2015-06-03 LAB — INFLUENZA PANEL BY PCR (TYPE A & B)
H1N1 flu by pcr: NOT DETECTED
Influenza A By PCR: NEGATIVE
Influenza B By PCR: NEGATIVE

## 2015-06-03 LAB — I-STAT TROPONIN, ED: TROPONIN I, POC: 0.01 ng/mL (ref 0.00–0.08)

## 2015-06-03 LAB — BRAIN NATRIURETIC PEPTIDE: B Natriuretic Peptide: 11.3 pg/mL (ref 0.0–100.0)

## 2015-06-03 MED ORDER — ALBUTEROL (5 MG/ML) CONTINUOUS INHALATION SOLN
10.0000 mg/h | INHALATION_SOLUTION | RESPIRATORY_TRACT | Status: DC
  Administered 2015-06-03: 10 mg/h via RESPIRATORY_TRACT
  Filled 2015-06-03: qty 20

## 2015-06-03 MED ORDER — IPRATROPIUM-ALBUTEROL 0.5-2.5 (3) MG/3ML IN SOLN: 3.0000 mL | RESPIRATORY_TRACT | Status: DC | PRN

## 2015-06-03 MED ORDER — SODIUM CHLORIDE 0.9% FLUSH: 3.0000 mL | INTRAVENOUS | Status: DC | PRN

## 2015-06-03 MED ORDER — METOPROLOL TARTRATE 12.5 MG HALF TABLET
12.5000 mg | ORAL_TABLET | Freq: Two times a day (BID) | ORAL | Status: DC
  Administered 2015-06-04 – 2015-06-08 (×10): 12.5 mg via ORAL
  Filled 2015-06-03 (×10): qty 1

## 2015-06-03 MED ORDER — SENNOSIDES-DOCUSATE SODIUM 8.6-50 MG PO TABS: 1.0000 | ORAL_TABLET | Freq: Every evening | ORAL | Status: DC | PRN

## 2015-06-03 MED ORDER — HYDROCHLOROTHIAZIDE 25 MG PO TABS
25.0000 mg | ORAL_TABLET | Freq: Every day | ORAL | Status: DC
  Filled 2015-06-03: qty 1

## 2015-06-03 MED ORDER — MONTELUKAST SODIUM 10 MG PO TABS
10.0000 mg | ORAL_TABLET | Freq: Every day | ORAL | Status: DC
  Administered 2015-06-04: 10 mg via ORAL
  Filled 2015-06-03: qty 1

## 2015-06-03 MED ORDER — SODIUM CHLORIDE 0.9% FLUSH
3.0000 mL | Freq: Two times a day (BID) | INTRAVENOUS | Status: DC
  Administered 2015-06-04 – 2015-06-07 (×4): 3 mL via INTRAVENOUS

## 2015-06-03 MED ORDER — SODIUM CHLORIDE 0.9% FLUSH
3.0000 mL | Freq: Two times a day (BID) | INTRAVENOUS | Status: DC
  Administered 2015-06-04 – 2015-06-07 (×4): 3 mL via INTRAVENOUS

## 2015-06-03 MED ORDER — HYDROCODONE-ACETAMINOPHEN 5-325 MG PO TABS
1.0000 | ORAL_TABLET | Freq: Once | ORAL | Status: AC
  Administered 2015-06-03: 1 via ORAL
  Filled 2015-06-03: qty 1

## 2015-06-03 MED ORDER — APIXABAN 2.5 MG PO TABS
2.5000 mg | ORAL_TABLET | Freq: Two times a day (BID) | ORAL | Status: DC
  Administered 2015-06-04 – 2015-06-08 (×10): 2.5 mg via ORAL
  Filled 2015-06-03 (×10): qty 1

## 2015-06-03 MED ORDER — METHYLPREDNISOLONE SODIUM SUCC 125 MG IJ SOLR
80.0000 mg | Freq: Four times a day (QID) | INTRAMUSCULAR | Status: DC
  Administered 2015-06-04 – 2015-06-05 (×6): 80 mg via INTRAVENOUS
  Filled 2015-06-03 (×6): qty 2

## 2015-06-03 MED ORDER — LEVOFLOXACIN IN D5W 500 MG/100ML IV SOLN
500.0000 mg | INTRAVENOUS | Status: DC
  Administered 2015-06-03 – 2015-06-04 (×2): 500 mg via INTRAVENOUS
  Filled 2015-06-03 (×2): qty 100

## 2015-06-03 MED ORDER — ALBUTEROL SULFATE (2.5 MG/3ML) 0.083% IN NEBU
5.0000 mg | INHALATION_SOLUTION | Freq: Once | RESPIRATORY_TRACT | Status: DC
  Filled 2015-06-03: qty 6

## 2015-06-03 MED ORDER — DM-GUAIFENESIN ER 30-600 MG PO TB12
1.0000 | ORAL_TABLET | Freq: Two times a day (BID) | ORAL | Status: DC
  Administered 2015-06-04: 1 via ORAL
  Filled 2015-06-03 (×2): qty 1

## 2015-06-03 MED ORDER — SODIUM CHLORIDE 0.9 % IV SOLN: 250.0000 mL | INTRAVENOUS | Status: DC | PRN

## 2015-06-03 NOTE — ED Provider Notes (Signed)
CSN: [id number]     Arrival date & time 06/03/15  1018 History   First MD Initiated Contact with Patient 06/03/15 1031     Chief Complaint  Patient presents with  . Shortness of Breath     (Consider location/radiation/quality/duration/timing/severity/associated sxs/prior Treatment) HPI   Patient is a 55 year old male with past medical history of hypertension, CHF, COPD, CAD, Hep C, DVT and PE (on Eliquis) who presents to the ED via EMS with complaint of shortness of breath, onset 4 days. Patient reports having worsening shortness of breath over the past 4 days. Endorses associated chills, wheezing, intermittent chest tightness and productive cough (light yellow/white sputum). He reports earlier this morning while he was at home his shortness of breath significantly worse and causing him to feel lightheaded. Patient reports he felt like he was about to pass out but denies any LOC. He notes he has also had nasal congestion and rhinorrhea for the past month. He notes he was prescribed Augmentin by his PCP, he recently finished the prescription but notes he has continued to have congestion. Patient states he has been taking his albuterol inhaler and Advair over the past 4 days without relief. Denies fever, chest pain, palpitations, abdominal pain, nausea, vomiting, diarrhea, urinary symptoms, leg swelling. Patient reports he was last admitted for COPD exacerbation approximately 5 months ago.  PCP- Dr. Jimmye Norman ID- Dr. Linus Salmons  Past Medical History  Diagnosis Date  . Asthma   . DVT (deep venous thrombosis) ([place])     "I had ~ 10 in each leg"  . Pulmonary embolism ([place])     a. 2012 - s/p IVC filter;  b. prev on eliquis - noncompliant.  Marland Kitchen CAD (coronary artery disease)     a. 09/2013 NSTEMI/PCI: LM nl, LAD 40-50%, D1 100 (2.25 x 28 Vision BMS), LCX min irregs, RI 60-70, 30, RCA 40-50/50-99ms/p, EF 45-50%.  Marland Kitchen COPD (chronic obstructive pulmonary disease) ([place])   . History of pneumonia   . Sinus headache    . Osteoarthritis     a. hands and toes.  . Chronic diastolic CHF (congestive heart failure) ([place])     a. 09/2014 EF 45-50% by LV gram;  b. 01/2014 Echo: EF 55-60%, Gr 1 DD.  Marland Kitchen Essential hypertension   . Tobacco abuse   . Hepatitis C   . Cocaine abuse    Past Surgical History  Procedure Laterality Date  . Vena cava filter placement  " ~ 2012  . Coronary angioplasty with stent placement  10/21/13    BMS to D1  . Left heart catheterization with coronary angiogram N/A 10/21/2013    Procedure: LEFT HEART CATHETERIZATION WITH CORONARY ANGIOGRAM;  Surgeon: Leonie Man, MD;  Location[facility] CATH LAB;  Service: Cardiovascular;  Laterality: N/A;  . Cardiac catheterization N/A 01/04/2015    Procedure: Left Heart Cath and Coronary Angiography;  Surgeon: Burnell Blanks, MD;  Location: [facility] CV LAB;  Service: Cardiovascular;  Laterality: N/A;   Family History  Problem Relation Age of Onset  . Asthma Mother   . Allergies Mother   . Allergies Sister   . Allergies Brother   . Deep vein thrombosis Brother     two brothers with recurrent DVT  . Heart attack Father     a.60s b. deceased in his 59s  . Coronary artery disease Father    Social History  Substance Use Topics  . Smoking status: Current Every Day Smoker -- 0.25 packs/day for 38 years  Types: Cigarettes  . Smokeless tobacco: Never Used  . Alcohol Use: 3.6 oz/week    6 Cans of beer per week     Comment:  "drink  2 40oz beers weekly"    Review of Systems  Constitutional: Positive for chills.  HENT: Positive for congestion and rhinorrhea.   Respiratory: Positive for cough, chest tightness, shortness of breath and wheezing.   Neurological: Positive for light-headedness.  All other systems reviewed and are negative.     Allergies  Review of patient's allergies indicates no known allergies.  Home Medications   Prior to Admission medications   Medication Sig Start Date End Date Taking? Authorizing Provider   albuterol (PROVENTIL HFA;VENTOLIN HFA) 108 (90 BASE) MCG/ACT inhaler Inhale 2 puffs into the lungs every 6 (six) hours as needed for wheezing or shortness of breath. 10/22/14  Yes Charlynne Cousins, MD  amoxicillin-clavulanate (AUGMENTIN) 875-125 MG tablet Take 1 tablet by mouth 2 (two) times daily.   Yes Historical Provider, MD  apixaban (ELIQUIS) 2.5 MG TABS tablet Take 1 tablet (2.5 mg total) by mouth 2 (two) times daily. 01/04/15  Yes Eugenie Filler, MD  Fluticasone-Salmeterol (ADVAIR) 250-50 MCG/DOSE AEPB Inhale 1 puff into the lungs 2 (two) times daily.   Yes Historical Provider, MD  hydrochlorothiazide (HYDRODIURIL) 25 MG tablet Take 1 tablet (25 mg total) by mouth daily. 01/04/15  Yes Eugenie Filler, MD  Ledipasvir-Sofosbuvir (HARVONI) 90-400 MG TABS Take 1 tablet by mouth daily. 04/07/15  Yes Thayer Headings, MD  metoprolol (LOPRESSOR) 25 MG tablet Take 0.5 tablets (12.5 mg total) by mouth 2 (two) times daily. 01/04/15  Yes Eugenie Filler, MD  montelukast (SINGULAIR) 10 MG tablet Take 1 tablet (10 mg total) by mouth at bedtime. 01/04/15  Yes Eugenie Filler, MD  nitroGLYCERIN (NITROSTAT) 0.4 MG SL tablet Place 1 tablet (0.4 mg total) under the tongue every 5 (five) minutes as needed for chest pain (CP or SOB). 01/04/15  Yes Eugenie Filler, MD  tiotropium (SPIRIVA) 18 MCG inhalation capsule Place 1 capsule (18 mcg total) into inhaler and inhale daily. 01/04/15  Yes Eugenie Filler, MD  albuterol (PROVENTIL) (2.5 MG/3ML) 0.083% nebulizer solution Take 3 mLs (2.5 mg total) by nebulization every 6 (six) hours as needed for wheezing or shortness of breath. Patient not taking: Reported on 06/03/2015 12/02/14   Thurnell Lose, MD  Fluticasone-Salmeterol (ADVAIR DISKUS) 500-50 MCG/DOSE AEPB Inhale 1 puff into the lungs 2 (two) times daily. Patient not taking: Reported on 06/03/2015 01/04/15   Eugenie Filler, MD   BP 137/96 mmHg  Pulse 93  Temp(Src) 98 F (36.7 C) (Axillary)  Resp  25  Ht 6\' 2"  (1.88 m)  Wt 80.74 kg  BMI 22.84 kg/m2  SpO2 96% Physical Exam  Constitutional: He is oriented to person, place, and time. He appears well-developed and well-nourished.  HENT:  Head: Normocephalic and atraumatic.  Mouth/Throat: Oropharynx is clear and moist. No oropharyngeal exudate.  Eyes: Conjunctivae and EOM are normal. Right eye exhibits no discharge. Left eye exhibits no discharge. No scleral icterus.  Neck: Normal range of motion. Neck supple.  Cardiovascular: Normal rate, regular rhythm, normal heart sounds and intact distal pulses.   Pulmonary/Chest: He has wheezes (diffuse wheezing). He has rales in the right lower field and the left lower field. He exhibits no tenderness.  Tachypneic, RR 34.  Abdominal: Soft. Bowel sounds are normal. He exhibits no distension and no mass. There is no tenderness. There is no  rebound and no guarding.  Musculoskeletal: Normal range of motion. He exhibits no edema.  Lymphadenopathy:    He has no cervical adenopathy.  Neurological: He is alert and oriented to person, place, and time.  Skin: Skin is warm and dry.  Nursing note and vitals reviewed.   ED Course  Procedures (including critical care time) Labs Review Labs Reviewed  CBC WITH DIFFERENTIAL/PLATELET - Abnormal; Notable for the following:    Neutro Abs 1.2 (*)    Eosinophils Absolute 1.6 (*)    All other components within normal limits  BASIC METABOLIC PANEL - Abnormal; Notable for the following:    Glucose, Bld 109 (*)    All other components within normal limits  BRAIN NATRIURETIC PEPTIDE  I-STAT TROPOININ, ED    Imaging Review Dg Chest Port 1 View  06/03/2015  CLINICAL DATA:  Shortness of Breath, history of tobacco use EXAM: PORTABLE CHEST 1 VIEW COMPARISON:  12/31/2014 FINDINGS: The heart size and mediastinal contours are within normal limits. Both lungs are clear. The visualized skeletal structures are unremarkable. IMPRESSION: No active disease. Electronically  Signed   By: Inez Catalina M.D.   On: 06/03/2015 11:31   I have personally reviewed and evaluated these images and lab results as part of my medical decision-making.   EKG Interpretation None      MDM   Final diagnoses:  COPD exacerbation ([place])    Patient presents with worsening shortness of breath, wheezing, cough. No relief with albuterol inhaler or Advair at home. History of COPD, HTN, CAD, HTN, CHF, DVT and PT (on Eliquis). Patient reports she typically smokes 4-5 cigarettes daily states over the past few days he has only been smoking 3 or 4 cigarettes a day. On initial presentation, pt tachypneic at RR 32, O2 99% on 8L, afebrile, normotensive, HR 81. Exam revealed diffuse wheezing bilaterally with rales noted in bilateral lower lobes. EMS administered 125mg  solumedrol and 10mg  albuterol neb tx PTA.   Chart review shows that patient was last admitted for COPD exacerbation on 12/12/2014.  EKG showed sinus rhythm. CXR negative. Labs unremarkable. On reevaluation status post albuterol neb treatment, patient reports his breathing has not improved and he still feels short of breath. On examination patient is coughing intermittently and audible wheezes noted. Patient has diffuse wheezing noted bilaterally. Oxygen saturation 94% on room air.   Discussed case with Dr. Audie Pinto. Plan to ambulate pt with pulse ox. I was notified by nurse that pt's O2 sat dropped to 81% on RA while ambulating. Consulted hospitalist for admission for COPD exacerbation. I spoke with NP [place], plan to admit pt to stepdown bed, attending Dr. Waldron Labs. Discussed results and plan for admission with patient.  Chesley Noon [place], [place] 06/03/15 1327  Leonard Schwartz, MD 06/03/15 [phone]

## 2015-06-03 NOTE — ED Notes (Signed)
Ambulated Pt in hall while on Pulse Ox. Started at 96%. Pt walked without assistance. Pt's O2 went down to 81%. Walked Pt back to bed and had him sit on the sit of the bed to catch his breathe. Pt is hooked up to Monitor and got up to 99% after sitting for about 87minutes. Pt and I start having a conversation and as He is talking his O2 starts to drop as well.

## 2015-06-03 NOTE — ED Notes (Signed)
PA at bedside.

## 2015-06-03 NOTE — ED Notes (Signed)
Monitor in room would not pick up EKG - lead stickers changed out, wires replaced with two different sets. Still unable to preform EKG.   Portable EKG machine at bedside

## 2015-06-03 NOTE — ED Notes (Signed)
Report attempted 

## 2015-06-03 NOTE — ED Notes (Signed)
Meal ordered for PT at this time.  

## 2015-06-03 NOTE — H&P (Signed)
[facility] History and Physical                                                                                    [name], is a 55 y.o. male  MRN: [id number]   DOB - 1960/05/05  Admit Date - 06/03/2015  Outpatient Primary MD for the patient is [name], MD  ID: [name]  With History of -  Past Medical History  Diagnosis Date  . Asthma   . DVT (deep venous thrombosis) ([facility])     "I had ~ 10 in each leg"  . Pulmonary embolism ([facility])     a. 2012 - s/p IVC filter;  b. prev on eliquis - noncompliant.  Marland Kitchen CAD (coronary artery disease)     a. 09/2013 NSTEMI/PCI: LM nl, LAD 40-50%, D1 100 (2.25 x 28 Vision BMS), LCX min irregs, RI 60-70, 30, RCA 40-50/50-32ms/p, EF 45-50%.  Marland Kitchen COPD (chronic obstructive pulmonary disease) ([place])   . History of pneumonia   . Sinus headache   . Osteoarthritis     a. hands and toes.  . Chronic diastolic CHF (congestive heart failure) ([facility])     a. 09/2014 EF 45-50% by LV gram;  b. 01/2014 Echo: EF 55-60%, Gr 1 DD.  Marland Kitchen Essential hypertension   . Tobacco abuse   . Hepatitis C   . Cocaine abuse       Past Surgical History  Procedure Laterality Date  . Vena cava filter placement  " ~ 2012  . Coronary angioplasty with stent placement  10/21/13    BMS to D1  . Left heart catheterization with coronary angiogram N/A 10/21/2013    Procedure: LEFT HEART CATHETERIZATION WITH CORONARY ANGIOGRAM;  Surgeon: Leonie Man, MD;  Location: [facility] CATH LAB;  Service: Cardiovascular;  Laterality: N/A;  . Cardiac catheterization N/A 01/04/2015    Procedure: Left Heart Cath and Coronary Angiography;  Surgeon: Burnell Blanks, MD;  Location: [facility] CV LAB;  Service: Cardiovascular;  Laterality: N/A;    in for   Chief Complaint  Patient presents with  . Shortness of Breath     HPI  [name]  is a 55 y.o. male with PMH of DVT, PE, s/p of vena cava filter placement 2012, CAD, S/P of stent placement 2015, COPD, asthma, diastolic  congestive heart failure, hypertension, chronic Hep C (followed by [name]), medication noncompliance and hx of cocaine abuse presents to ED with sob. Last admission in 12/2014 for COPD exacerbation. Pt reports approximately 3 week hx of shortness of breath notably worse over the last 4 days. He states he had his rescue inhaler refilled 4 days ago and is now almost empty. Pt has had little relief with IV Solumedrol and nebulizer treatments given in ED.   He states he has been compliant with all meds. Pt denies recent fever, but does endorse chills. No recent headache, dizziness, hemoptysis, abdominal pain, n/v/d. Cough productive of thick sputum, midsternal and nonradiating chest pain that is worse with cough. Upon ambulation in ED, pt's O2 sat dropped to 81%. Currently 96% on 4L/min [place]. Pt to be admitted for further evaluation and treatment.  Review of Systems   In addition to the HPI above,  No Headache, No changes with Vision or hearing, No problems swallowing food or Liquids, No Abdominal pain, No Nausea or Vomiting, Bowel movements are regular, No Blood in stool or Urine, No dysuria, No new skin rashes or bruises, No new joints pains-aches,  No new weakness, tingling, numbness in any extremity, No recent weight gain or loss, A full 10 point Review of Systems was done, except as stated above, all other Review of Systems were negative.  Social History Smokes 4 cigarettes/day. Denies etoh use since Hep C diagnosis.   Family History Family History  Problem Relation Age of Onset  . Asthma Mother   . Allergies Mother   . Allergies Sister   . Allergies Brother   . Deep vein thrombosis Brother     two brothers with recurrent DVT  . Heart attack Father     a.60s b. deceased in his 67s  . Coronary artery disease Father     Prior to Admission medications   Medication Sig Start Date End Date Taking? Authorizing Provider  albuterol (PROVENTIL HFA;VENTOLIN HFA) 108 (90 BASE) MCG/ACT  inhaler Inhale 2 puffs into the lungs every 6 (six) hours as needed for wheezing or shortness of breath. 10/22/14  Yes Charlynne Cousins, MD  amoxicillin-clavulanate (AUGMENTIN) 875-125 MG tablet Take 1 tablet by mouth 2 (two) times daily.   Yes Historical Provider, MD  apixaban (ELIQUIS) 2.5 MG TABS tablet Take 1 tablet (2.5 mg total) by mouth 2 (two) times daily. 01/04/15  Yes Eugenie Filler, MD  Fluticasone-Salmeterol (ADVAIR) 250-50 MCG/DOSE AEPB Inhale 1 puff into the lungs 2 (two) times daily.   Yes Historical Provider, MD  hydrochlorothiazide (HYDRODIURIL) 25 MG tablet Take 1 tablet (25 mg total) by mouth daily. 01/04/15  Yes Eugenie Filler, MD  Ledipasvir-Sofosbuvir (HARVONI) 90-400 MG TABS Take 1 tablet by mouth daily. 04/07/15  Yes Thayer Headings, MD  metoprolol (LOPRESSOR) 25 MG tablet Take 0.5 tablets (12.5 mg total) by mouth 2 (two) times daily. 01/04/15  Yes Eugenie Filler, MD  montelukast (SINGULAIR) 10 MG tablet Take 1 tablet (10 mg total) by mouth at bedtime. 01/04/15  Yes Eugenie Filler, MD  nitroGLYCERIN (NITROSTAT) 0.4 MG SL tablet Place 1 tablet (0.4 mg total) under the tongue every 5 (five) minutes as needed for chest pain (CP or SOB). 01/04/15  Yes Eugenie Filler, MD  tiotropium (SPIRIVA) 18 MCG inhalation capsule Place 1 capsule (18 mcg total) into inhaler and inhale daily. 01/04/15  Yes Eugenie Filler, MD  albuterol (PROVENTIL) (2.5 MG/3ML) 0.083% nebulizer solution Take 3 mLs (2.5 mg total) by nebulization every 6 (six) hours as needed for wheezing or shortness of breath. Patient not taking: Reported on 06/03/2015 12/02/14   Thurnell Lose, MD  Fluticasone-Salmeterol (ADVAIR DISKUS) 500-50 MCG/DOSE AEPB Inhale 1 puff into the lungs 2 (two) times daily. Patient not taking: Reported on 06/03/2015 01/04/15   Eugenie Filler, MD    No Known Allergies  Physical Exam  Vitals  Blood pressure 137/96, pulse 93, temperature 98 F (36.7 C), temperature source  Axillary, resp. rate 25, height 6\' 2"  (1.88 m), weight 80.74 kg (178 lb), SpO2 96 %.   General:  Awake, alert sitting upright in bed  Psych:  Normal affect and insight, Not Suicidal or Homicidal, Awake Alert, Oriented X 3.  Neuro:   No F.N deficits, ALL C.Nerves Intact, Strength 5/5 all 4 extremities, Sensation intact  all 4 extremities.  ENT:  Ears and Eyes appear Normal, Conjunctivae clear, PER. Moist oral mucosa without erythema or exudates.  Neck:  Supple, No lymphadenopathy appreciated  Respiratory:  Symmetrical chest wall movement, Course breath sounds throughout with end expiratory wheeze. Tachypneic  Cardiac:  RRR, No Murmurs, no LE edema noted, no JVD.    Abdomen:  Positive bowel sounds, Soft, Non tender, Non distended,  No masses appreciated  Skin:  No Cyanosis, Normal Skin Turgor, No Skin Rash or Bruise.  Extremities:  Able to move all 4. 5/5 strength in each,  no effusions.  Data Review  CBC  Recent Labs Lab 06/03/15 1045  WBC 5.3  HGB 13.7  HCT 39.7  PLT 160  MCV 90.6  MCH 31.3  MCHC 34.5  RDW 12.5  LYMPHSABS 1.8  MONOABS 0.6  EOSABS 1.6*  BASOSABS 0.1    Chemistries   Recent Labs Lab 06/03/15 1045  NA 138  K 3.6  CL 104  CO2 23  GLUCOSE 109*  BUN 9  CREATININE 0.99  CALCIUM 9.7    estimated creatinine clearance is 96.2 mL/min (by C-G formula based on Cr of 0.99).  No results for input(s): TSH, T4TOTAL, T3FREE, THYROIDAB in the last 72 hours.  Invalid input(s): FREET3   Urinalysis    Component Value Date/Time   COLORURINE YELLOW 03/28/2012 0118   APPEARANCEUR CLEAR 03/28/2012 0118   LABSPEC 1.035* 03/28/2012 0118   PHURINE 6.0 03/28/2012 0118   GLUCOSEU >1000* 03/28/2012 0118   HGBUR NEGATIVE 03/28/2012 0118   BILIRUBINUR NEGATIVE 03/28/2012 0118   KETONESUR NEGATIVE 03/28/2012 0118   PROTEINUR NEGATIVE 03/28/2012 0118   UROBILINOGEN 0.2 03/28/2012 0118   NITRITE NEGATIVE 03/28/2012 0118   LEUKOCYTESUR NEGATIVE 03/28/2012  0118    Imaging results:   Dg Chest Port 1 View  06/03/2015  CLINICAL DATA:  Shortness of Breath, history of tobacco use EXAM: PORTABLE CHEST 1 VIEW COMPARISON:  12/31/2014 FINDINGS: The heart size and mediastinal contours are within normal limits. Both lungs are clear. The visualized skeletal structures are unremarkable. IMPRESSION: No active disease. Electronically Signed   By: Inez Catalina M.D.   On: 06/03/2015 11:31    My personal review of EKG: NSR, No ST changes noted.   Assessment & Plan  Active Problems:   History of pulmonary embolism   History of noncompliance with medical treatment   History of DVT (deep vein thrombosis)   Acute respiratory failure (HCC)   History of coronary artery disease   Essential hypertension   Chronic diastolic CHF (congestive heart failure) (HCC)   COPD exacerbation (HCC)  Acute hypoxic respiratory failure secondary to acute COPD exacerbation -admit SDU for O2, nebs, IV solumedrol, mucinex -empiric Levaquin given productive cough and recent chills -Pt nontoxic appearing with normal WBC, currently afebrile  Hx of PE/DVT -s/p IVC filter 2012 -continue Eloquis  Hx Hep C -stable -continue home meds. Outpt f/u   Chronic diastolic HF -2Decho 123XX123 shows EF 0000000, grade I diastolic dysfunction -no evidence of volume overload on exam. Continue home HCTZ  Hx htn -stable. Continue home BB and hctz  Hx CAD -cycle cardiac enzymes with above -continue BB  Tobacco abuse -pt counseled  Hx medical noncompliance  Hx cocaine abuse -check UDS  DVT Prophylaxis: on Eloquis   AM Labs Ordered, also please review Full Orders  Family Communication:   None available at time of admit  Code Status:  Full code  Condition:  guarded  Time spent in minutes :  [place], NP on 06/03/2015 at 1:39 PM Between 7am to 7pm - Pager - [phone] After 7pm go to [email or web address] - password [email or web address]  And look for the night coverage person  covering me after hours  [facility]

## 2015-06-03 NOTE — ED Notes (Signed)
[facility] from home. Pt endorses SOB x4 days. Using albuterol inhaler at home. States that he had passed out prior to EMS arrival.  EMS arrival found wheezing all quadrants. Rhonchi lower lobes. 20g LA. 125mg  Solumedrol. 10mg  total albuterol. 1mg  Atrovent. 4 puffs albuterol PTA

## 2015-06-04 DIAGNOSIS — J441 Chronic obstructive pulmonary disease with (acute) exacerbation: Principal | ICD-10-CM

## 2015-06-04 DIAGNOSIS — J9601 Acute respiratory failure with hypoxia: Secondary | ICD-10-CM

## 2015-06-04 DIAGNOSIS — Z8679 Personal history of other diseases of the circulatory system: Secondary | ICD-10-CM

## 2015-06-04 DIAGNOSIS — Z86711 Personal history of pulmonary embolism: Secondary | ICD-10-CM

## 2015-06-04 DIAGNOSIS — I10 Essential (primary) hypertension: Secondary | ICD-10-CM

## 2015-06-04 DIAGNOSIS — N179 Acute kidney failure, unspecified: Secondary | ICD-10-CM

## 2015-06-04 DIAGNOSIS — Z86718 Personal history of other venous thrombosis and embolism: Secondary | ICD-10-CM

## 2015-06-04 DIAGNOSIS — I5032 Chronic diastolic (congestive) heart failure: Secondary | ICD-10-CM

## 2015-06-04 LAB — URINALYSIS, ROUTINE W REFLEX MICROSCOPIC
Bilirubin Urine: NEGATIVE
Glucose, UA: NEGATIVE mg/dL
Hgb urine dipstick: NEGATIVE
Ketones, ur: NEGATIVE mg/dL
Leukocytes, UA: NEGATIVE
Nitrite: NEGATIVE
Protein, ur: NEGATIVE mg/dL
Specific Gravity, Urine: 1.018 (ref 1.005–1.030)
pH: 6 (ref 5.0–8.0)

## 2015-06-04 LAB — CBC
HEMATOCRIT: 36.5 % — AB (ref 39.0–52.0)
Hemoglobin: 12.7 g/dL — ABNORMAL LOW (ref 13.0–17.0)
MCH: 31.4 pg (ref 26.0–34.0)
MCHC: 34.8 g/dL (ref 30.0–36.0)
MCV: 90.1 fL (ref 78.0–100.0)
PLATELETS: 157 10*3/uL (ref 150–400)
RBC: 4.05 MIL/uL — AB (ref 4.22–5.81)
RDW: 12.4 % (ref 11.5–15.5)
WBC: 3.2 10*3/uL — ABNORMAL LOW (ref 4.0–10.5)

## 2015-06-04 LAB — BASIC METABOLIC PANEL WITH GFR
Anion gap: 17 — ABNORMAL HIGH (ref 5–15)
BUN: 20 mg/dL (ref 6–20)
CO2: 21 mmol/L — ABNORMAL LOW (ref 22–32)
Calcium: 9.8 mg/dL (ref 8.9–10.3)
Chloride: 98 mmol/L — ABNORMAL LOW (ref 101–111)
Creatinine, Ser: 1.55 mg/dL — ABNORMAL HIGH (ref 0.61–1.24)
GFR calc Af Amer: 57 mL/min — ABNORMAL LOW
GFR calc non Af Amer: 49 mL/min — ABNORMAL LOW
Glucose, Bld: 126 mg/dL — ABNORMAL HIGH (ref 65–99)
Potassium: 4.8 mmol/L (ref 3.5–5.1)
Sodium: 136 mmol/L (ref 135–145)

## 2015-06-04 LAB — TROPONIN I
Troponin I: <0.03 ng/mL
Troponin I: <0.03 ng/mL

## 2015-06-04 LAB — CREATININE, URINE, RANDOM: CREATININE, URINE: 100.41 mg/dL

## 2015-06-04 LAB — RAPID URINE DRUG SCREEN, HOSP PERFORMED
AMPHETAMINES: NOT DETECTED
BARBITURATES: NOT DETECTED
BENZODIAZEPINES: NOT DETECTED
Cocaine: NOT DETECTED
Opiates: POSITIVE — AB
TETRAHYDROCANNABINOL: NOT DETECTED

## 2015-06-04 LAB — MRSA PCR SCREENING: MRSA BY PCR: NEGATIVE

## 2015-06-04 LAB — SODIUM, URINE, RANDOM: Sodium, Ur: 23 mmol/L

## 2015-06-04 MED ORDER — LEVALBUTEROL HCL 0.63 MG/3ML IN NEBU
0.6300 mg | INHALATION_SOLUTION | Freq: Four times a day (QID) | RESPIRATORY_TRACT | Status: DC
  Administered 2015-06-04 – 2015-06-06 (×9): 0.63 mg via RESPIRATORY_TRACT
  Filled 2015-06-04 (×9): qty 3

## 2015-06-04 MED ORDER — HYDROCODONE-HOMATROPINE 5-1.5 MG/5ML PO SYRP
5.0000 mL | ORAL_SOLUTION | Freq: Four times a day (QID) | ORAL | Status: DC | PRN
  Administered 2015-06-04 – 2015-06-08 (×11): 5 mL via ORAL
  Filled 2015-06-04 (×11): qty 5

## 2015-06-04 MED ORDER — IPRATROPIUM BROMIDE 0.02 % IN SOLN
0.5000 mg | Freq: Four times a day (QID) | RESPIRATORY_TRACT | Status: DC
  Administered 2015-06-04 – 2015-06-06 (×9): 0.5 mg via RESPIRATORY_TRACT
  Filled 2015-06-04 (×9): qty 2.5

## 2015-06-04 MED ORDER — LORATADINE 10 MG PO TABS
10.0000 mg | ORAL_TABLET | Freq: Every day | ORAL | Status: DC
  Administered 2015-06-04 – 2015-06-08 (×5): 10 mg via ORAL
  Filled 2015-06-04 (×6): qty 1

## 2015-06-04 MED ORDER — OXYCODONE HCL 5 MG PO TABS
5.0000 mg | ORAL_TABLET | Freq: Once | ORAL | Status: AC
  Administered 2015-06-04: 5 mg via ORAL
  Filled 2015-06-04: qty 1

## 2015-06-04 MED ORDER — ARFORMOTEROL TARTRATE 15 MCG/2ML IN NEBU
15.0000 ug | INHALATION_SOLUTION | Freq: Two times a day (BID) | RESPIRATORY_TRACT | Status: DC
  Administered 2015-06-04 – 2015-06-08 (×9): 15 ug via RESPIRATORY_TRACT
  Filled 2015-06-04 (×8): qty 2

## 2015-06-04 MED ORDER — IPRATROPIUM BROMIDE 0.02 % IN SOLN: 0.5000 mg | RESPIRATORY_TRACT | Status: DC | PRN

## 2015-06-04 MED ORDER — OSELTAMIVIR PHOSPHATE 75 MG PO CAPS
75.0000 mg | ORAL_CAPSULE | Freq: Two times a day (BID) | ORAL | Status: DC
  Administered 2015-06-04 – 2015-06-08 (×9): 75 mg via ORAL
  Filled 2015-06-04 (×14): qty 1

## 2015-06-04 MED ORDER — FAMOTIDINE 20 MG PO TABS
20.0000 mg | ORAL_TABLET | Freq: Two times a day (BID) | ORAL | Status: DC
  Administered 2015-06-04 – 2015-06-08 (×8): 20 mg via ORAL
  Filled 2015-06-04 (×8): qty 1

## 2015-06-04 MED ORDER — FLUTICASONE PROPIONATE 50 MCG/ACT NA SUSP
2.0000 | Freq: Every day | NASAL | Status: DC
  Administered 2015-06-04 – 2015-06-08 (×4): 2 via NASAL
  Filled 2015-06-04 (×3): qty 16

## 2015-06-04 MED ORDER — TRAMADOL HCL 50 MG PO TABS
100.0000 mg | ORAL_TABLET | Freq: Four times a day (QID) | ORAL | Status: DC | PRN
  Administered 2015-06-04 – 2015-06-08 (×13): 100 mg via ORAL
  Filled 2015-06-04 (×13): qty 2

## 2015-06-04 MED ORDER — LEDIPASVIR-SOFOSBUVIR 90-400 MG PO TABS
1.0000 | ORAL_TABLET | Freq: Every morning | ORAL | Status: DC
  Administered 2015-06-04: 14:00:00 via ORAL
  Administered 2015-06-05 – 2015-06-08 (×4): 1 via ORAL
  Filled 2015-06-04 (×2): qty 1

## 2015-06-04 MED ORDER — LEVALBUTEROL HCL 0.63 MG/3ML IN NEBU: 0.6300 mg | INHALATION_SOLUTION | RESPIRATORY_TRACT | Status: DC | PRN

## 2015-06-04 MED ORDER — BUDESONIDE 0.25 MG/2ML IN SUSP
0.2500 mg | Freq: Two times a day (BID) | RESPIRATORY_TRACT | Status: DC
  Administered 2015-06-04 – 2015-06-08 (×9): 0.25 mg via RESPIRATORY_TRACT
  Filled 2015-06-04 (×9): qty 2

## 2015-06-04 MED ORDER — GUAIFENESIN ER 600 MG PO TB12
1200.0000 mg | ORAL_TABLET | Freq: Two times a day (BID) | ORAL | Status: DC
  Administered 2015-06-04 – 2015-06-08 (×9): 1200 mg via ORAL
  Filled 2015-06-04 (×10): qty 2

## 2015-06-04 MED ORDER — PANTOPRAZOLE SODIUM 40 MG PO TBEC
40.0000 mg | DELAYED_RELEASE_TABLET | Freq: Every day | ORAL | Status: DC
  Administered 2015-06-04: 40 mg via ORAL
  Filled 2015-06-04: qty 1

## 2015-06-04 NOTE — Progress Notes (Signed)
Utilization Review Completed.  

## 2015-06-04 NOTE — Progress Notes (Signed)
[facility] PROGRESS NOTE  [name] [id number] DOB: 19-Dec-1960 DOA: 06/03/2015 PCP: [name], MD  Assessment/Plan: #1 acute respiratory failure with hypoxia likely secondary to COPD exacerbation Patient presenting with acute respiratory failure with hypoxia noted to have sats of 81% with increased respiratory rate of 32. Patient coughing incessantly with complaints of shortness of breath. Check a sputum Gram stain and culture. Repeat chest x-ray in the morning. Check RSV. Continue oxygen, IV Levaquin. Will add Mucinex, Claritin, Flonase, PPI, Brovana, Pulmicort, Tamiflu. Change nebs to Xopenex and Atrovent as patient noted to be tachycardic. Chest PT.  #2 acute kidney injury Questionable etiology. May be secondary to a prerenal azotemia as patient also noted to be on HCTZ. Discontinue HCTZ. Check a fractional excretion of sodium. Check a UA with cultures and sensitivities. Place on gentle hydration.  #3 history of PE/DVT status post IVC filter 2012 Continue Eliquis  #4 Hx Hep C Stable. Continue home meds.  #5 Chronic diastolic HF Stable.  #6 hypertension Stable. Continue Lopressor. Discontinue HCTZ secondary to acute kidney injury.  #7 history of coronary artery disease Stable. Cardiac enzymes negative 3. Continue Lopressor.  #8 tobacco abuse  tobacco cessation.  #9 history of cocaine abuse UDS negative for cocaine.  #10 prophylaxis PPI for GI prophylaxis.Eliquis for DVT prophylaxis.  Code Status: Full Family Communication: Updated patient. No family at bedside. Disposition Plan: Remain in the step down unit today.   Consultants:  None  Procedures:  Chest x-ray 06/03/2015  Antibiotics:  IV Levaquin 06/03/2015    HPI/Subjective: Patient states not feeling well at all. Patient states has been coughing incessantly even when standing up to urinate. Patient complaining of back pain associated with the cough. Patient denies any chest pain.  Patient with complaints of shortness of breath. Patient also complaining of nasal congestion.  Objective: Filed Vitals:   06/04/15 0023 06/04/15 0336  BP: 136/92 128/95  Pulse:    Temp: 98.9 F (37.2 C) 99 F (37.2 C)  Resp: 25 14    Intake/Output Summary (Last 24 hours) at 06/04/15 0958 Last data filed at 06/04/15 0655  Gross per 24 hour  Intake      0 ml  Output    150 ml  Net   -150 ml   Filed Weights   06/03/15 1026 06/04/15 0020  Weight: 80.74 kg (178 lb) 72.848 kg (160 lb 9.6 oz)    Exam:   General:  Sitting up in bed coughing incessantly.  Cardiovascular: Tachycardic  Respiratory: Diffuse coarse rhonchorous breath sounds with some expiratory and inspiratory wheezing. No crackles.  Abdomen: Soft, nontender, nondistended, positive bowel sounds.  Musculoskeletal: No clubbing cyanosis or edema.  Data Reviewed: Basic Metabolic Panel:  Recent Labs Lab 06/03/15 1045 06/04/15 0051  NA 138 136  K 3.6 4.8  CL 104 98*  CO2 23 21*  GLUCOSE 109* 126*  BUN 9 20  CREATININE 0.99 1.55*  CALCIUM 9.7 9.8   Liver Function Tests: No results for input(s): AST, ALT, ALKPHOS, BILITOT, PROT, ALBUMIN in the last 168 hours. No results for input(s): LIPASE, AMYLASE in the last 168 hours. No results for input(s): AMMONIA in the last 168 hours. CBC:  Recent Labs Lab 06/03/15 1045 06/04/15 0051  WBC 5.3 3.2*  NEUTROABS 1.2*  --   HGB 13.7 12.7*  HCT 39.7 36.5*  MCV 90.6 90.1  PLT 160 157   Cardiac Enzymes:  Recent Labs Lab 06/04/15 0129 06/04/15 0732  TROPONINI <0.03 <0.03   BNP (last 3  results)  Recent Labs  12/01/14 0311 01/01/15 0528 06/03/15 1045  BNP 137.2* 87.1 11.3    ProBNP (last 3 results) No results for input(s): PROBNP in the last 8760 hours.  CBG: No results for input(s): GLUCAP in the last 168 hours.  Recent Results (from the past 240 hour(s))  MRSA PCR Screening     Status: None   Collection Time: 06/04/15 12:58 AM  Result Value  Ref Range Status   MRSA by PCR NEGATIVE NEGATIVE Final    Comment:        The GeneXpert MRSA Assay (FDA approved for NASAL specimens only), is one component of a comprehensive MRSA colonization surveillance program. It is not intended to diagnose MRSA infection nor to guide or monitor treatment for MRSA infections.      Studies: Dg Chest Port 1 View  06/03/2015  CLINICAL DATA:  Shortness of Breath, history of tobacco use EXAM: PORTABLE CHEST 1 VIEW COMPARISON:  12/31/2014 FINDINGS: The heart size and mediastinal contours are within normal limits. Both lungs are clear. The visualized skeletal structures are unremarkable. IMPRESSION: No active disease. Electronically Signed   By: Inez Catalina M.D.   On: 06/03/2015 11:31    Scheduled Meds: . albuterol  5 mg Nebulization Once  . apixaban  2.5 mg Oral BID  . dextromethorphan-guaiFENesin  1 tablet Oral BID  . levofloxacin (LEVAQUIN) IV  500 mg Intravenous Q24H  . methylPREDNISolone (SOLU-MEDROL) injection  80 mg Intravenous Q6H  . metoprolol  12.5 mg Oral BID  . montelukast  10 mg Oral QHS  . oseltamivir  75 mg Oral BID  . sodium chloride flush  3 mL Intravenous Q12H  . sodium chloride flush  3 mL Intravenous Q12H   Continuous Infusions: . albuterol Stopped (06/03/15 2108)    Principal Problem:   Acute respiratory failure with hypoxia (HCC) Active Problems:   History of pulmonary embolism   History of noncompliance with medical treatment   History of DVT (deep vein thrombosis)   HTN (hypertension)   Acute respiratory failure (HCC)   AKI (acute kidney injury) (HCC)   History of coronary artery disease   Essential hypertension   Chronic diastolic CHF (congestive heart failure) (HCC)   COPD exacerbation ([place])    Time spent: 70 minutes    THOMPSON,DANIEL M.D. [facility] Pager [phone]. If 7PM-7AM, please contact night-coverage at [email or web address], password [facility] 06/04/2015, 9:58 AM  LOS: 1 day

## 2015-06-04 NOTE — Progress Notes (Signed)
Initial Nutrition Assessment  DOCUMENTATION CODES:   Non-severe (moderate) malnutrition in context of chronic illness  INTERVENTION:  - Provide Carnation Instant Breakfast with whole milk TID, 280 kcal, 13 grams of protein per serving   NUTRITION DIAGNOSIS:   Inadequate oral intake related to chronic illness as evidenced by per patient/family report, energy intake < or equal to 75% for > or equal to 1 month, moderate depletion of body fat, moderate depletions of muscle mass.  GOAL:   Patient will meet greater than or equal to 90% of their needs  MONITOR:   PO intake, Supplement acceptance, Labs, Weight trends  REASON FOR ASSESSMENT:   Malnutrition Screening Tool  ASSESSMENT:   Pt with PMH of DVT, PE, s/p of vena cava filter placement 2012, CAD, S/P of stent placement 2015, COPD, asthma, diastolic congestive heart failure, hypertension, chronic Hep C, medication noncompliance and hx of cocaine abuse presents to ED with SOB.   Pt seen for MST. Pt was experiencing severe coughing during time of visit, pt was able to provide brief history but requested to keep visit short. Pt reports his appetite is good today but he can not eat because of his coughing. PTA pt reports his appetite fluctuates, some days he will eat 3-4 meals others he will only eat once. Pt requested Carnation instant breakfast with meals, will provided carnation instant breakfast with whole milk to help meet pt's nutritional needs.   Pt reports a 10 lb (7.5%) weight loss within 1 month, which is significant for time frame. Weight history per chart supports pt's report.    Conducted nutrition focused physical exam, identified moderate muscle wasting and moderate fat wasting. No edema present.   Labs reviewed. Medications reviewed.   Diet Order:  Diet Heart Room service appropriate?: Yes; Fluid consistency:: Thin  Skin:  Reviewed, no issues  Last BM:  06/03/2015  Height:   Ht Readings from Last 1 Encounters:   06/04/15 6\' 2"  (1.88 m)    Weight:   Wt Readings from Last 1 Encounters:  06/04/15 160 lb 9.6 oz (72.848 kg)    Ideal Body Weight:  86.3 kg  BMI:  Body mass index is 20.61 kg/(m^2).  Estimated Nutritional Needs:   Kcal:  1900-2100 (30 kcal/kg)   Protein:  95-110 grams   Fluid:  >/= 2 L  EDUCATION NEEDS:   No education needs identified at this time  [place], Dietetic Intern Pager: [phone]

## 2015-06-05 ENCOUNTER — Inpatient Hospital Stay (HOSPITAL_COMMUNITY): Payer: Medicare Other

## 2015-06-05 DIAGNOSIS — E440 Moderate protein-calorie malnutrition: Secondary | ICD-10-CM | POA: Insufficient documentation

## 2015-06-05 LAB — CBC WITH DIFFERENTIAL/PLATELET
BASOS PCT: 0 %
Basophils Absolute: 0 10*3/uL (ref 0.0–0.1)
EOS ABS: 0 10*3/uL (ref 0.0–0.7)
Eosinophils Relative: 0 %
HEMATOCRIT: 34.5 % — AB (ref 39.0–52.0)
HEMOGLOBIN: 11.3 g/dL — AB (ref 13.0–17.0)
LYMPHS ABS: 0.6 10*3/uL — AB (ref 0.7–4.0)
Lymphocytes Relative: 18 %
MCH: 29.9 pg (ref 26.0–34.0)
MCHC: 32.8 g/dL (ref 30.0–36.0)
MCV: 91.3 fL (ref 78.0–100.0)
Monocytes Absolute: 0.2 10*3/uL (ref 0.1–1.0)
Monocytes Relative: 5 %
NEUTROS ABS: 2.4 10*3/uL (ref 1.7–7.7)
NEUTROS PCT: 77 %
Platelets: 166 10*3/uL (ref 150–400)
RBC: 3.78 MIL/uL — AB (ref 4.22–5.81)
RDW: 12.3 % (ref 11.5–15.5)
WBC: 3.1 10*3/uL — AB (ref 4.0–10.5)

## 2015-06-05 LAB — URINE CULTURE: Culture: NO GROWTH

## 2015-06-05 LAB — BASIC METABOLIC PANEL
Anion gap: 10 (ref 5–15)
BUN: 26 mg/dL — ABNORMAL HIGH (ref 6–20)
CHLORIDE: 100 mmol/L — AB (ref 101–111)
CO2: 23 mmol/L (ref 22–32)
Calcium: 9.6 mg/dL (ref 8.9–10.3)
Creatinine, Ser: 1.15 mg/dL (ref 0.61–1.24)
GFR calc non Af Amer: >60 mL/min (ref >60)
Glucose, Bld: 161 mg/dL — ABNORMAL HIGH (ref 65–99)
POTASSIUM: 4.8 mmol/L (ref 3.5–5.1)
SODIUM: 133 mmol/L — AB (ref 135–145)

## 2015-06-05 LAB — MAGNESIUM: MAGNESIUM: 2 mg/dL (ref 1.7–2.4)

## 2015-06-05 MED ORDER — SODIUM CHLORIDE 0.9 % IV SOLN
INTRAVENOUS | Status: DC
  Administered 2015-06-05 – 2015-06-07 (×3): via INTRAVENOUS

## 2015-06-05 MED ORDER — LEVOFLOXACIN 500 MG PO TABS
500.0000 mg | ORAL_TABLET | ORAL | Status: DC
  Administered 2015-06-05 – 2015-06-07 (×3): 500 mg via ORAL
  Filled 2015-06-05 (×4): qty 1

## 2015-06-05 MED ORDER — METHYLPREDNISOLONE SODIUM SUCC 125 MG IJ SOLR
80.0000 mg | Freq: Three times a day (TID) | INTRAMUSCULAR | Status: DC
  Administered 2015-06-05 – 2015-06-06 (×3): 80 mg via INTRAVENOUS
  Filled 2015-06-05 (×3): qty 2

## 2015-06-05 NOTE — Progress Notes (Signed)
Patient respiratory assessment done and patient scored a 10. Should decrease patient treatments at this time, but feels like Q6 for one more day would benefit patient. Started flutter in place of chest vest. Patient doing much better. Will reassess tomorrow

## 2015-06-05 NOTE — Progress Notes (Signed)
Report called to Claiborne Billings, [place] receiving nurse. No family at the bedside at present' none notified per patient request. Stated he will update them.

## 2015-06-05 NOTE — Progress Notes (Signed)
PHARMACIST - PHYSICIAN COMMUNICATION DR:   [place] Silos CONCERNING: Antibiotic IV to Oral Route Change Policy  RECOMMENDATION: This patient is receiving Levaquin by the intravenous route.  Based on criteria approved by the Pharmacy and Therapeutics Committee, the antibiotic(s) is/are being converted to the equivalent oral dose form(s).   DESCRIPTION: These criteria include:  Patient being treated for a respiratory tract infection, urinary tract infection, cellulitis or clostridium difficile associated diarrhea if on metronidazole  The patient is not neutropenic and does not exhibit a GI malabsorption state  The patient is eating (either orally or via tube) and/or has been taking other orally administered medications for a least 24 hours  The patient is improving clinically and has a Tmax < 100.5  If you have questions about this conversion, please contact the Pharmacy Department  []   [phone] )  [facility] []   [phone] )  [facility] [x]   [phone] )  [facility] []   [phone] )  [facility] []   [phone] )  Sebastian. Diona Foley, PharmD, [place] Clinical Pharmacist Pager [phone]

## 2015-06-05 NOTE — Progress Notes (Signed)
[facility] PROGRESS NOTE  [name] [id number] DOB: 06-Sep-1960 DOA: 06/03/2015 PCP: [name], MD  Assessment/Plan: #1 acute respiratory failure with hypoxia likely secondary to COPD exacerbation Patient presenting with acute respiratory failure with hypoxia noted to have sats of 81% with increased respiratory rate of 32. Patient with improvement with cough. Sputum Gram stain and culture pending.  Repeat chest x-ray negative. RSV pending. Continue oxygen, IV Levaquin, Mucinex, Claritin, Flonase, PPI, Brovana, Pulmicort, Tamiflu, Xopenex and Atrovent nebs. Chest PT.  #2 acute kidney injury Questionable etiology. May be secondary to a prerenal azotemia as patient also noted to be on HCTZ. Discontinued HCTZ. Renal function improved. Urinalysis negative. Continue gentle hydration.   #3 history of PE/DVT status post IVC filter 2012 Continue Eliquis  #4 Hx Hep C Stable. Continue harvoni.   #5 Chronic diastolic HF Stable.  #6 hypertension Stable. Continue Lopressor. Discontinued HCTZ secondary to acute kidney injury.  #7 history of coronary artery disease Stable. Cardiac enzymes negative 3. Continue Lopressor.  #8 tobacco abuse  tobacco cessation.  #9 history of cocaine abuse UDS negative for cocaine.  #10 prophylaxis PPI for GI prophylaxis.Eliquis for DVT prophylaxis.  Code Status: Full Family Communication: Updated patient. No family at bedside. Disposition Plan: Transfer to telemetry.   Consultants:  None  Procedures:  Chest x-ray 06/03/2015  Antibiotics:  IV Levaquin 06/03/2015    HPI/Subjective: Patient states feeling better than yesterday. Some improvement with cough. Some improvement with shortness of breath.   Objective: Filed Vitals:   06/05/15 0330 06/05/15 0842  BP: 134/85 119/97  Pulse:  62  Temp: 98 F (36.7 C) 97.3 F (36.3 C)  Resp:  13    Intake/Output Summary (Last 24 hours) at 06/05/15 0953 Last data filed at  06/05/15 0855  Gross per 24 hour  Intake    600 ml  Output   1125 ml  Net   -525 ml   Filed Weights   06/03/15 1026 06/04/15 0020  Weight: 80.74 kg (178 lb) 72.848 kg (160 lb 9.6 oz)    Exam:   General:  Sitting up in bed.  Cardiovascular: RRR  Respiratory: Diffuse coarse rhonchorous breath sounds with some expiratory and inspiratory wheezing. No crackles.  Abdomen: Soft, nontender, nondistended, positive bowel sounds.  Musculoskeletal: No clubbing cyanosis or edema.  Data Reviewed: Basic Metabolic Panel:  Recent Labs Lab 06/03/15 1045 06/04/15 0051 06/05/15 0237  NA 138 136 133*  K 3.6 4.8 4.8  CL 104 98* 100*  CO2 23 21* 23  GLUCOSE 109* 126* 161*  BUN 9 20 26*  CREATININE 0.99 1.55* 1.15  CALCIUM 9.7 9.8 9.6  MG  --   --  2.0   Liver Function Tests: No results for input(s): AST, ALT, ALKPHOS, BILITOT, PROT, ALBUMIN in the last 168 hours. No results for input(s): LIPASE, AMYLASE in the last 168 hours. No results for input(s): AMMONIA in the last 168 hours. CBC:  Recent Labs Lab 06/03/15 1045 06/04/15 0051 06/05/15 0237  WBC 5.3 3.2* 3.1*  NEUTROABS 1.2*  --  2.4  HGB 13.7 12.7* 11.3*  HCT 39.7 36.5* 34.5*  MCV 90.6 90.1 91.3  PLT 160 157 166   Cardiac Enzymes:  Recent Labs Lab 06/04/15 0129 06/04/15 0732 06/04/15 1141  TROPONINI <0.03 <0.03 <0.03   BNP (last 3 results)  Recent Labs  12/01/14 0311 01/01/15 0528 06/03/15 1045  BNP 137.2* 87.1 11.3    ProBNP (last 3 results) No results for input(s): PROBNP in the last 8760  hours.  CBG: No results for input(s): GLUCAP in the last 168 hours.  Recent Results (from the past 240 hour(s))  MRSA PCR Screening     Status: None   Collection Time: 06/04/15 12:58 AM  Result Value Ref Range Status   MRSA by PCR NEGATIVE NEGATIVE Final    Comment:        The GeneXpert MRSA Assay (FDA approved for NASAL specimens only), is one component of a comprehensive MRSA colonization surveillance  program. It is not intended to diagnose MRSA infection nor to guide or monitor treatment for MRSA infections.      Studies: Dg Chest 2 View  06/05/2015  CLINICAL DATA:  Shortness of breath EXAM: CHEST  2 VIEW COMPARISON:  June 03, 2015 FINDINGS: The heart size and mediastinal contours are within normal limits. Both lungs are clear. The visualized skeletal structures are unremarkable. IMPRESSION: No active cardiopulmonary disease. Electronically Signed   By: Dorise Bullion III M.D   On: 06/05/2015 08:15   Dg Chest Port 1 View  06/03/2015  CLINICAL DATA:  Shortness of Breath, history of tobacco use EXAM: PORTABLE CHEST 1 VIEW COMPARISON:  12/31/2014 FINDINGS: The heart size and mediastinal contours are within normal limits. Both lungs are clear. The visualized skeletal structures are unremarkable. IMPRESSION: No active disease. Electronically Signed   By: Inez Catalina M.D.   On: 06/03/2015 11:31    Scheduled Meds: . albuterol  5 mg Nebulization Once  . apixaban  2.5 mg Oral BID  . arformoterol  15 mcg Nebulization BID  . budesonide (PULMICORT) nebulizer solution  0.25 mg Nebulization BID  . famotidine  20 mg Oral BID  . fluticasone  2 spray Each Nare Daily  . guaiFENesin  1,200 mg Oral BID  . ipratropium  0.5 mg Nebulization Q6H  . Ledipasvir-Sofosbuvir  1 tablet Oral q morning - 10a  . levalbuterol  0.63 mg Nebulization Q6H  . levofloxacin  500 mg Oral Q24H  . loratadine  10 mg Oral Daily  . methylPREDNISolone (SOLU-MEDROL) injection  80 mg Intravenous Q6H  . metoprolol  12.5 mg Oral BID  . oseltamivir  75 mg Oral BID  . sodium chloride flush  3 mL Intravenous Q12H  . sodium chloride flush  3 mL Intravenous Q12H   Continuous Infusions:    Principal Problem:   Acute respiratory failure with hypoxia (HCC) Active Problems:   History of pulmonary embolism   History of noncompliance with medical treatment   History of DVT (deep vein thrombosis)   HTN (hypertension)   Acute  respiratory failure (HCC)   AKI (acute kidney injury) ([place])   History of coronary artery disease   Essential hypertension   Chronic diastolic CHF (congestive heart failure) (HCC)   COPD exacerbation (HCC)   Malnutrition of moderate degree    Time spent: 86 minutes    Nicholous Girgenti M.D. [facility] Pager [phone]. If 7PM-7AM, please contact night-coverage at [email or web address], password [facility] 06/05/2015, 9:53 AM  LOS: 2 days

## 2015-06-06 LAB — BASIC METABOLIC PANEL
ANION GAP: 8 (ref 5–15)
BUN: 26 mg/dL — ABNORMAL HIGH (ref 6–20)
CHLORIDE: 103 mmol/L (ref 101–111)
CO2: 22 mmol/L (ref 22–32)
Calcium: 8.8 mg/dL — ABNORMAL LOW (ref 8.9–10.3)
Creatinine, Ser: 0.98 mg/dL (ref 0.61–1.24)
GFR calc Af Amer: >60 mL/min (ref >60)
GLUCOSE: 144 mg/dL — AB (ref 65–99)
POTASSIUM: 5.2 mmol/L — AB (ref 3.5–5.1)
Sodium: 133 mmol/L — ABNORMAL LOW (ref 135–145)

## 2015-06-06 LAB — CBC
HEMATOCRIT: 33.7 % — AB (ref 39.0–52.0)
HEMOGLOBIN: 10.9 g/dL — AB (ref 13.0–17.0)
MCH: 29.7 pg (ref 26.0–34.0)
MCHC: 32.3 g/dL (ref 30.0–36.0)
MCV: 91.8 fL (ref 78.0–100.0)
PLATELETS: 149 10*3/uL — AB (ref 150–400)
RBC: 3.67 MIL/uL — AB (ref 4.22–5.81)
RDW: 12.4 % (ref 11.5–15.5)
WBC: 2.2 10*3/uL — AB (ref 4.0–10.5)

## 2015-06-06 MED ORDER — METHYLPREDNISOLONE SODIUM SUCC 125 MG IJ SOLR
60.0000 mg | Freq: Two times a day (BID) | INTRAMUSCULAR | Status: DC
  Administered 2015-06-06 – 2015-06-07 (×2): 60 mg via INTRAVENOUS
  Filled 2015-06-06 (×2): qty 2

## 2015-06-06 MED ORDER — LEVALBUTEROL HCL 0.63 MG/3ML IN NEBU
0.6300 mg | INHALATION_SOLUTION | Freq: Three times a day (TID) | RESPIRATORY_TRACT | Status: DC
  Administered 2015-06-07 – 2015-06-08 (×5): 0.63 mg via RESPIRATORY_TRACT
  Filled 2015-06-06 (×5): qty 3

## 2015-06-06 MED ORDER — IPRATROPIUM BROMIDE 0.02 % IN SOLN
0.5000 mg | Freq: Three times a day (TID) | RESPIRATORY_TRACT | Status: DC
Start: 2015-06-07 — End: 2015-06-08
  Administered 2015-06-07 – 2015-06-08 (×5): 0.5 mg via RESPIRATORY_TRACT
  Filled 2015-06-06 (×5): qty 2.5

## 2015-06-06 NOTE — Progress Notes (Signed)
[facility] PROGRESS NOTE  [name] [id number] DOB: 1961/03/14 DOA: 06/03/2015 PCP: [name], MD  Assessment/Plan: #1 acute respiratory failure with hypoxia likely secondary to COPD exacerbation Patient presenting with acute respiratory failure with hypoxia noted to have sats of 81% with increased respiratory rate of 32. Patient with improvement with cough. Sputum Gram stain and culture pending.  Repeat chest x-ray negative. RSV pending. Continue oxygen, Change IV Levaquin to oral levaquin. Continue Mucinex, Claritin, Flonase, PPI, Brovana, Pulmicort, Tamiflu, Xopenex and Atrovent nebs. Chest PT.  #2 acute kidney injury Questionable etiology. May be secondary to a prerenal azotemia as patient also noted to be on HCTZ. Discontinued HCTZ. Renal function improved. Urinalysis negative. Continue gentle hydration.   #3 history of PE/DVT status post IVC filter 2012 Continue Eliquis  #4 Hx Hep C Stable. Continue harvoni.   #5 Chronic diastolic HF Stable.  #6 hypertension Stable. Continue Lopressor. Discontinued HCTZ secondary to acute kidney injury.  #7 history of coronary artery disease Stable. Cardiac enzymes negative 3. Continue Lopressor.  #8 tobacco abuse  tobacco cessation.  #9 history of cocaine abuse UDS negative for cocaine.  #10 prophylaxis Pepcid for GI prophylaxis.Eliquis for DVT prophylaxis.  Code Status: Full Family Communication: Updated patient. No family at bedside. Disposition Plan: Transfer to telemetry.   Consultants:  None  Procedures:  Chest x-ray 06/03/2015  Antibiotics:  IV Levaquin 06/03/2015>>>> oral levaquin.    HPI/Subjective: Patient states feels better. Still c/o cough. SOB improving.   Objective: Filed Vitals:   06/06/15 0021 06/06/15 0457  BP: 123/76 124/80  Pulse: 63 57  Temp: 98 F (36.7 C) 97.3 F (36.3 C)  Resp: 18 18    Intake/Output Summary (Last 24 hours) at 06/06/15 1204 Last data filed at  06/06/15 0732  Gross per 24 hour  Intake    900 ml  Output    250 ml  Net    650 ml   Filed Weights   06/03/15 1026 06/04/15 0020  Weight: 80.74 kg (178 lb) 72.848 kg (160 lb 9.6 oz)    Exam:   General:  Sitting up in bed.  Cardiovascular: RRR  Respiratory: Less diffuse coarse rhonchorous breath sounds with less expiratory and inspiratory wheezing. No crackles.  Abdomen: Soft, nontender, nondistended, positive bowel sounds.  Musculoskeletal: No clubbing cyanosis or edema.  Data Reviewed: Basic Metabolic Panel:  Recent Labs Lab 06/03/15 1045 06/04/15 0051 06/05/15 0237 06/06/15 0804  NA 138 136 133* 133*  K 3.6 4.8 4.8 5.2*  CL 104 98* 100* 103  CO2 23 21* 23 22  GLUCOSE 109* 126* 161* 144*  BUN 9 20 26* 26*  CREATININE 0.99 1.55* 1.15 0.98  CALCIUM 9.7 9.8 9.6 8.8*  MG  --   --  2.0  --    Liver Function Tests: No results for input(s): AST, ALT, ALKPHOS, BILITOT, PROT, ALBUMIN in the last 168 hours. No results for input(s): LIPASE, AMYLASE in the last 168 hours. No results for input(s): AMMONIA in the last 168 hours. CBC:  Recent Labs Lab 06/03/15 1045 06/04/15 0051 06/05/15 0237 06/06/15 0804  WBC 5.3 3.2* 3.1* 2.2*  NEUTROABS 1.2*  --  2.4  --   HGB 13.7 12.7* 11.3* 10.9*  HCT 39.7 36.5* 34.5* 33.7*  MCV 90.6 90.1 91.3 91.8  PLT 160 157 166 149*   Cardiac Enzymes:  Recent Labs Lab 06/04/15 0129 06/04/15 0732 06/04/15 1141  TROPONINI <0.03 <0.03 <0.03   BNP (last 3 results)  Recent Labs  12/01/14  [id number] 01/01/15 0528 06/03/15 1045  BNP 137.2* 87.1 11.3    ProBNP (last 3 results) No results for input(s): PROBNP in the last 8760 hours.  CBG: No results for input(s): GLUCAP in the last 168 hours.  Recent Results (from the past 240 hour(s))  MRSA PCR Screening     Status: None   Collection Time: 06/04/15 12:58 AM  Result Value Ref Range Status   MRSA by PCR NEGATIVE NEGATIVE Final    Comment:        The GeneXpert MRSA Assay  (FDA approved for NASAL specimens only), is one component of a comprehensive MRSA colonization surveillance program. It is not intended to diagnose MRSA infection nor to guide or monitor treatment for MRSA infections.   Culture, Urine     Status: None   Collection Time: 06/04/15 10:09 AM  Result Value Ref Range Status   Specimen Description URINE, CLEAN CATCH  Final   Special Requests NONE  Final   Culture NO GROWTH 1 DAY  Final   Report Status 06/05/2015 FINAL  Final     Studies: Dg Chest 2 View  06/05/2015  CLINICAL DATA:  Shortness of breath EXAM: CHEST  2 VIEW COMPARISON:  June 03, 2015 FINDINGS: The heart size and mediastinal contours are within normal limits. Both lungs are clear. The visualized skeletal structures are unremarkable. IMPRESSION: No active cardiopulmonary disease. Electronically Signed   By: Dorise Bullion III M.D   On: 06/05/2015 08:15    Scheduled Meds: . albuterol  5 mg Nebulization Once  . apixaban  2.5 mg Oral BID  . arformoterol  15 mcg Nebulization BID  . budesonide (PULMICORT) nebulizer solution  0.25 mg Nebulization BID  . famotidine  20 mg Oral BID  . fluticasone  2 spray Each Nare Daily  . guaiFENesin  1,200 mg Oral BID  . ipratropium  0.5 mg Nebulization Q6H  . Ledipasvir-Sofosbuvir  1 tablet Oral q morning - 10a  . levalbuterol  0.63 mg Nebulization Q6H  . levofloxacin  500 mg Oral Q24H  . loratadine  10 mg Oral Daily  . methylPREDNISolone (SOLU-MEDROL) injection  80 mg Intravenous 3 times per day  . metoprolol  12.5 mg Oral BID  . oseltamivir  75 mg Oral BID  . sodium chloride flush  3 mL Intravenous Q12H  . sodium chloride flush  3 mL Intravenous Q12H   Continuous Infusions: . sodium chloride 75 mL/hr at 06/05/15 2305    Principal Problem:   Acute respiratory failure with hypoxia (HCC) Active Problems:   History of pulmonary embolism   History of noncompliance with medical treatment   History of DVT (deep vein thrombosis)   HTN  (hypertension)   Acute respiratory failure (HCC)   AKI (acute kidney injury) (HCC)   History of coronary artery disease   Essential hypertension   Chronic diastolic CHF (congestive heart failure) (HCC)   COPD exacerbation (HCC)   Malnutrition of moderate degree    Time spent: 58 minutes    THOMPSON,DANIEL M.D. [facility] Pager [phone]. If 7PM-7AM, please contact night-coverage at [email or web address], password [facility] 06/06/2015, 12:04 PM  LOS: 3 days

## 2015-06-07 ENCOUNTER — Ambulatory Visit: Payer: Medicare Other | Admitting: *Deleted

## 2015-06-07 LAB — RESPIRATORY VIRUS PANEL
ADENOVIRUS: NEGATIVE
INFLUENZA A: NEGATIVE
Influenza B: NEGATIVE
Metapneumovirus: NEGATIVE
PARAINFLUENZA 2 A: NEGATIVE
Parainfluenza 1: NEGATIVE
Parainfluenza 3: NEGATIVE
RESPIRATORY SYNCYTIAL VIRUS A: NEGATIVE
RESPIRATORY SYNCYTIAL VIRUS B: NEGATIVE
RHINOVIRUS: NEGATIVE

## 2015-06-07 LAB — BASIC METABOLIC PANEL
Anion gap: 8 (ref 5–15)
BUN: 23 mg/dL — AB (ref 6–20)
CALCIUM: 8.5 mg/dL — AB (ref 8.9–10.3)
CHLORIDE: 108 mmol/L (ref 101–111)
CO2: 19 mmol/L — ABNORMAL LOW (ref 22–32)
CREATININE: 1.01 mg/dL (ref 0.61–1.24)
Glucose, Bld: 154 mg/dL — ABNORMAL HIGH (ref 65–99)
Potassium: 4.1 mmol/L (ref 3.5–5.1)
SODIUM: 135 mmol/L (ref 135–145)

## 2015-06-07 LAB — CBC
HCT: 32.7 % — ABNORMAL LOW (ref 39.0–52.0)
Hemoglobin: 10.6 g/dL — ABNORMAL LOW (ref 13.0–17.0)
MCH: 29.9 pg (ref 26.0–34.0)
MCHC: 32.4 g/dL (ref 30.0–36.0)
MCV: 92.1 fL (ref 78.0–100.0)
PLATELETS: 137 10*3/uL — AB (ref 150–400)
RBC: 3.55 MIL/uL — ABNORMAL LOW (ref 4.22–5.81)
RDW: 12.6 % (ref 11.5–15.5)
WBC: 2.6 10*3/uL — ABNORMAL LOW (ref 4.0–10.5)

## 2015-06-07 MED ORDER — STERILE WATER FOR INJECTION IV SOLN
INTRAVENOUS | Status: DC
  Administered 2015-06-07 (×2): via INTRAVENOUS
  Filled 2015-06-07 (×5): qty 850

## 2015-06-07 MED ORDER — PREDNISONE 50 MG PO TABS
60.0000 mg | ORAL_TABLET | Freq: Every day | ORAL | Status: DC
  Administered 2015-06-07 – 2015-06-08 (×2): 60 mg via ORAL
  Filled 2015-06-07 (×2): qty 1

## 2015-06-07 NOTE — Discharge Instructions (Addendum)
Information on my medicine - ELIQUIS (apixaban)  This medication education was reviewed with me or my healthcare representative as part of my discharge preparation.  The pharmacist that spoke with me during my hospital stay was:  Roma Schanz, [facility]  Why was Eliquis prescribed for you? Eliquis was prescribed for you to reduce the risk of forming blood clots that can cause a stroke if you have a medical condition called atrial fibrillation (a type of irregular heartbeat) OR to reduce the risk of a blood clots forming after orthopedic surgery.  What do You need to know about Eliquis ? Take your Eliquis TWICE DAILY - one tablet in the morning and one tablet in the evening with or without food.  It would be best to take the doses about the same time each day.  If you have difficulty swallowing the tablet whole please discuss with your pharmacist how to take the medication safely.  Take Eliquis exactly as prescribed by your doctor and DO NOT stop taking Eliquis without talking to the doctor who prescribed the medication.  Stopping may increase your risk of developing a new clot or stroke.  Refill your prescription before you run out.  After discharge, you should have regular check-up appointments with your healthcare provider that is prescribing your Eliquis.  In the future your dose may need to be changed if your kidney function or weight changes by a significant amount or as you get older.  What do you do if you miss a dose? If you miss a dose, take it as soon as you remember on the same day and resume taking twice daily.  Do not take more than one dose of ELIQUIS at the same time.  Important Safety Information A possible side effect of Eliquis is bleeding. You should call your healthcare provider right away if you experience any of the following: ? Bleeding from an injury or your nose that does not stop. ? Unusual colored urine (red or dark brown) or unusual colored stools (red or  black). ? Unusual bruising for unknown reasons. ? A serious fall or if you hit your head (even if there is no bleeding).  Some medicines may interact with Eliquis and might increase your risk of bleeding or clotting while on Eliquis. To help avoid this, consult your healthcare provider or pharmacist prior to using any new prescription or non-prescription medications, including herbals, vitamins, non-steroidal anti-inflammatory drugs (NSAIDs) and supplements.  This website has more information on Eliquis (apixaban): [email or web address].   Respiratory failure is when your lungs are not working well and your breathing (respiratory) system fails. When respiratory failure occurs, it is difficult for your lungs to get enough oxygen, get rid of carbon dioxide, or both. Respiratory failure can be life threatening.  Respiratory failure can be acute or chronic. Acute respiratory failure is sudden, severe, and requires emergency medical treatment. Chronic respiratory failure is less severe, happens over time, and requires ongoing treatment.  WHAT ARE THE CAUSES OF ACUTE RESPIRATORY FAILURE?  Any problem affecting the heart or lungs can cause acute respiratory failure. Some of these causes include the following:  Chronic bronchitis and emphysema (COPD).   Blood clot going to a lung (pulmonary embolism).   Having water in the lungs caused by heart failure, lung injury, or infection (pulmonary edema).   Collapsed lung (pneumothorax).   Pneumonia.   Pulmonary fibrosis.   Obesity.   Asthma.   Heart failure.   Any type of trauma to the chest that  can make breathing difficult.   Nerve or muscle diseases making chest movements difficult. HOW WILL MY ACUTE RESPIRATORY FAILURE BE TREATED?  Treatment of acute respiratory failure depends on the cause of the respiratory failure. Usually, you will stay in the intensive care unit so your breathing can be watched closely. Treatment can include the  following:  Oxygen. Oxygen can be delivered through the following:  Nasal cannula. This is small tubing that goes in your nose to give you oxygen.  Face mask. A face mask covers your nose and mouth to give you oxygen.  Medicine. Different medicines can be given to help with breathing. These can include:  Nebulizers. Nebulizers deliver medicines to open the air passages (bronchodilators). These medicines help to open or relax the airways in the lungs so you can breathe better. They can also help loosen mucus from your lungs.  Diuretics. Diuretic medicines can help you breathe better by getting rid of extra water in your body.  Steroids. Steroid medicines can help decrease swelling (inflammation) in your lungs.  Antibiotics.  Chest tube. If you have a collapsed lung (pneumothorax), a chest tube is placed to help reinflate the lung.  Noninvasive positive pressure ventilation (NPPV). This is a tight-fitting mask that goes over your nose and mouth. The mask has tubing that is attached to a machine. The machine blows air into the tubing, which helps to keep the tiny air sacs (alveoli) in your lungs open. This machine allows you to breathe on your own.  Ventilator. A ventilator is a breathing machine. When on a ventilator, a breathing tube is put into the lungs. A ventilator is used when you can no longer breathe well enough on your own. You may have low oxygen levels or high carbon dioxide (CO2) levels in your blood. When you are on a ventilator, sedation and pain medicines are given to make you sleep so your lungs can heal. SEEK IMMEDIATE MEDICAL CARE IF:  You have shortness of breath (dyspnea) with or without activity.  You have rapid breathing (tachypnea).  You are wheezing.  You are unable to say more than a few words without having to catch your breath.  You find it very difficult to function normally.  You have a fast heart rate.  You have a bluish color to your finger or toe  nail beds.  You have confusion or drowsiness or both.   This information is not intended to replace advice given to you by your health care provider. Make sure you discuss any questions you have with your health care provider.   Document Released: 03/18/2013 Document Revised: 12/02/2014 Document Reviewed: 03/18/2013 Elsevier Interactive Patient Education [employer].

## 2015-06-07 NOTE — Care Management Note (Addendum)
Case Management Note  Patient Details  Name: [name] MRN: [id number] Date of Birth: 1960/04/27  Subjective/Objective:                 Spoke with patient at the bedside. Independent male who lives with his girlfriend her two children and her mom. He states he "can" drive but does not. He states that his girlfiend drives him to the doctor and the pharmacy. He states that he does not need any equipment to help him ambulate. He does have a problem with walking distance because he gets short of breath. Patient states that he been trying to get nebulizer as seen on TV "with the balloon" for the past month with the help of his MD but still has not been able to get it. CM noted that he was Rx nebulizer PTA. CM noted that patient has been on Bovana this admission, will clarify with MD if Garlon Hatchet will be needed at discharge.  Patient states that he is followed with Dr Jimmye Norman for his pulmonologist. Patient SOB with speaking. Patient laughed during conversation and lost breath and was coughing.  Action/Plan:  Clarify if Garlon Hatchet will be needed after discharge. - Dr Grandville Silos stated patient will not be on Brovana at [place] to be delivered by [facility] to room prior to discharge.  Expected Discharge Date:                  Expected Discharge Plan:  [place]  In-House Referral:     Discharge planning Services  CM Consult  Post Acute Care Choice:  Durable Medical Equipment, Home Health Choice offered to:     DME Arranged:    DME Agency:     HH Arranged:    [place] Agency:     Status of Service:  In process, will continue to follow  Medicare Important Message Given:  Yes Date Medicare IM Given:    Medicare IM give by:    Date Additional Medicare IM Given:    Additional Medicare Important Message give by:     If discussed at [place] of Stay Meetings, dates discussed:    Additional Comments:  Carles Collet, RN 06/07/2015, 11:42 AM

## 2015-06-07 NOTE — Care Management Important Message (Signed)
Important Message  Patient Details  Name: [name] MRN: [id number] Date of Birth: [birth year]   Medicare Important Message Given:  Yes    Carles Collet, RN 06/07/2015, 10:45 AMImportant Message  Patient Details  Name: [name] MRN: [id number] Date of Birth: 06/13/1960   Medicare Important Message Given:  Yes    Carles Collet, RN 06/07/2015, 10:45 AM

## 2015-06-07 NOTE — Progress Notes (Signed)
[facility] PROGRESS NOTE  [name] [id number] DOB: Jan 19, 1961 DOA: 06/03/2015 PCP: [name], MD  Assessment/Plan: #1 acute respiratory failure with hypoxia likely secondary to COPD exacerbation Patient presenting with acute respiratory failure with hypoxia noted to have sats of 81% with increased respiratory rate of 32. Patient with improvement with cough. Sputum Gram stain and culture pending.  Repeat chest x-ray negative. RSV pending. Continue oxygen, oral levaquin, Mucinex, Claritin, Flonase, PPI, Brovana, Pulmicort, Tamiflu, Xopenex and Atrovent nebs. Chest PT. Change IV steroids to oral steroid taper.  #2 acute kidney injury Questionable etiology. May be secondary to a prerenal azotemia as patient also noted to be on HCTZ. Discontinued HCTZ. Renal function improved. Urinalysis negative. Continue gentle hydration.   #3 history of PE/DVT status post IVC filter 2012 Continue Eliquis  #4 Hx Hep C Stable. Continue harvoni.   #5 Chronic diastolic HF Stable.  #6 hypertension Stable. Continue Lopressor. Discontinued HCTZ secondary to acute kidney injury.  #7 history of coronary artery disease Stable. Cardiac enzymes negative 3. Continue Lopressor.  #8 tobacco abuse  tobacco cessation.  #9 history of cocaine abuse UDS negative for cocaine.  #10 prophylaxis Pepcid for GI prophylaxis.Eliquis for DVT prophylaxis.  Code Status: Full Family Communication: Updated patient. No family at bedside. Disposition Plan: Hopefully home in 1-2 days.   Consultants:  None  Procedures:  Chest x-ray 06/03/2015  Antibiotics:  IV Levaquin 06/03/2015>>>> oral levaquin.    HPI/Subjective: Patient states feels better. Still c/o cough. SOB improving.   Objective: Filed Vitals:   06/06/15 2212 06/07/15 0543  BP: 133/79 117/79  Pulse: 69 60  Temp: 97.8 F (36.6 C) 98.2 F (36.8 C)  Resp:  16    Intake/Output Summary (Last 24 hours) at 06/07/15 1221 Last  data filed at 06/07/15 0748  Gross per 24 hour  Intake   1140 ml  Output    450 ml  Net    690 ml   Filed Weights   06/03/15 1026 06/04/15 0020  Weight: 80.74 kg (178 lb) 72.848 kg (160 lb 9.6 oz)    Exam:   General:  Sitting up in bed.  Cardiovascular: RRR  Respiratory: Less diffuse coarse rhonchorous breath sounds with less expiratory and inspiratory wheezing. No crackles.  Abdomen: Soft, nontender, nondistended, positive bowel sounds.  Musculoskeletal: No clubbing cyanosis or edema.  Data Reviewed: Basic Metabolic Panel:  Recent Labs Lab 06/03/15 1045 06/04/15 0051 06/05/15 0237 06/06/15 0804 06/07/15 0510  NA 138 136 133* 133* 135  K 3.6 4.8 4.8 5.2* 4.1  CL 104 98* 100* 103 108  CO2 23 21* 23 22 19*  GLUCOSE 109* 126* 161* 144* 154*  BUN 9 20 26* 26* 23*  CREATININE 0.99 1.55* 1.15 0.98 1.01  CALCIUM 9.7 9.8 9.6 8.8* 8.5*  MG  --   --  2.0  --   --    Liver Function Tests: No results for input(s): AST, ALT, ALKPHOS, BILITOT, PROT, ALBUMIN in the last 168 hours. No results for input(s): LIPASE, AMYLASE in the last 168 hours. No results for input(s): AMMONIA in the last 168 hours. CBC:  Recent Labs Lab 06/03/15 1045 06/04/15 0051 06/05/15 0237 06/06/15 0804 06/07/15 0510  WBC 5.3 3.2* 3.1* 2.2* 2.6*  NEUTROABS 1.2*  --  2.4  --   --   HGB 13.7 12.7* 11.3* 10.9* 10.6*  HCT 39.7 36.5* 34.5* 33.7* 32.7*  MCV 90.6 90.1 91.3 91.8 92.1  PLT 160 157 166 149* 137*   Cardiac Enzymes:  Recent Labs Lab 06/04/15 0129 06/04/15 0732 06/04/15 1141  TROPONINI <0.03 <0.03 <0.03   BNP (last 3 results)  Recent Labs  12/01/14 0311 01/01/15 0528 06/03/15 1045  BNP 137.2* 87.1 11.3    ProBNP (last 3 results) No results for input(s): PROBNP in the last 8760 hours.  CBG: No results for input(s): GLUCAP in the last 168 hours.  Recent Results (from the past 240 hour(s))  MRSA PCR Screening     Status: None   Collection Time: 06/04/15 12:58 AM   Result Value Ref Range Status   MRSA by PCR NEGATIVE NEGATIVE Final    Comment:        The GeneXpert MRSA Assay (FDA approved for NASAL specimens only), is one component of a comprehensive MRSA colonization surveillance program. It is not intended to diagnose MRSA infection nor to guide or monitor treatment for MRSA infections.   Culture, Urine     Status: None   Collection Time: 06/04/15 10:09 AM  Result Value Ref Range Status   Specimen Description URINE, CLEAN CATCH  Final   Special Requests NONE  Final   Culture NO GROWTH 1 DAY  Final   Report Status 06/05/2015 FINAL  Final     Studies: No results found.  Scheduled Meds: . albuterol  5 mg Nebulization Once  . apixaban  2.5 mg Oral BID  . arformoterol  15 mcg Nebulization BID  . budesonide (PULMICORT) nebulizer solution  0.25 mg Nebulization BID  . famotidine  20 mg Oral BID  . fluticasone  2 spray Each Nare Daily  . guaiFENesin  1,200 mg Oral BID  . ipratropium  0.5 mg Nebulization TID  . Ledipasvir-Sofosbuvir  1 tablet Oral q morning - 10a  . levalbuterol  0.63 mg Nebulization TID  . levofloxacin  500 mg Oral Q24H  . loratadine  10 mg Oral Daily  . metoprolol  12.5 mg Oral BID  . oseltamivir  75 mg Oral BID  . predniSONE  60 mg Oral QAC breakfast  . sodium chloride flush  3 mL Intravenous Q12H  . sodium chloride flush  3 mL Intravenous Q12H   Continuous Infusions: .  sodium bicarbonate 150 mEq in sterile water 1000 mL infusion 75 mL/hr at 06/07/15 1014    Principal Problem:   Acute respiratory failure with hypoxia (HCC) Active Problems:   History of pulmonary embolism   History of noncompliance with medical treatment   History of DVT (deep vein thrombosis)   HTN (hypertension)   Acute respiratory failure (HCC)   AKI (acute kidney injury) (HCC)   History of coronary artery disease   Essential hypertension   Chronic diastolic CHF (congestive heart failure) (HCC)   COPD exacerbation (HCC)    Malnutrition of moderate degree    Time spent: 20 minutes    [name] M.D. [facility] Pager [phone]. If 7PM-7AM, please contact night-coverage at [email or web address], password [facility] 06/07/2015, 12:21 PM  LOS: 4 days

## 2015-06-08 MED ORDER — ALBUTEROL SULFATE (2.5 MG/3ML) 0.083% IN NEBU: 2.5000 mg | INHALATION_SOLUTION | Freq: Four times a day (QID) | RESPIRATORY_TRACT | Status: DC | PRN

## 2015-06-08 MED ORDER — FLUTICASONE PROPIONATE 50 MCG/ACT NA SUSP: 2.0000 | Freq: Every day | NASAL | Status: DC

## 2015-06-08 MED ORDER — LEVOFLOXACIN 500 MG PO TABS: 500.0000 mg | ORAL_TABLET | ORAL | Status: DC

## 2015-06-08 MED ORDER — TRAMADOL HCL 50 MG PO TABS: 100.0000 mg | ORAL_TABLET | Freq: Four times a day (QID) | ORAL | Status: DC | PRN

## 2015-06-08 MED ORDER — FAMOTIDINE 20 MG PO TABS: 20.0000 mg | ORAL_TABLET | Freq: Two times a day (BID) | ORAL | Status: DC

## 2015-06-08 MED ORDER — HYDROCODONE-HOMATROPINE 5-1.5 MG/5ML PO SYRP: 5.0000 mL | ORAL_SOLUTION | Freq: Four times a day (QID) | ORAL | Status: DC | PRN

## 2015-06-08 MED ORDER — BUDESONIDE-FORMOTEROL FUMARATE 160-4.5 MCG/ACT IN AERO: 2.0000 | INHALATION_SPRAY | Freq: Two times a day (BID) | RESPIRATORY_TRACT | Status: DC

## 2015-06-08 MED ORDER — GUAIFENESIN ER 600 MG PO TB12: 1200.0000 mg | ORAL_TABLET | Freq: Two times a day (BID) | ORAL | Status: DC

## 2015-06-08 MED ORDER — PREDNISONE 20 MG PO TABS: 60.0000 mg | ORAL_TABLET | Freq: Every day | ORAL | Status: DC

## 2015-06-08 NOTE — Progress Notes (Signed)
Removed PIV.  Reviewed discharge paperwork with pt and prescriptions given.  Pt received nebulizer.  Pt denied any needs at this time.  Pt taken to discharge location via wheelchair.

## 2015-06-08 NOTE — Discharge Summary (Signed)
Physician Discharge Summary  [name] [id number] DOB: 05-31-1960 DOA: 06/03/2015  PCP: [name], MD  Admit date: 06/03/2015 Discharge date: 06/08/2015  Time spent: 65 minutes  Recommendations for Outpatient Follow-up:  1. Follow up with [name], MD in 1-2 weeks.   Discharge Diagnoses:  Principal Problem:   Acute respiratory failure with hypoxia ([place]) Active Problems:   History of pulmonary embolism   History of noncompliance with medical treatment   History of DVT (deep vein thrombosis)   HTN (hypertension)   Acute respiratory failure (HCC)   AKI (acute kidney injury) ([place])   History of coronary artery disease   Essential hypertension   Chronic diastolic CHF (congestive heart failure) (HCC)   COPD exacerbation (HCC)   Malnutrition of moderate degree   Discharge Condition: stable and improved  Diet recommendation: Heart healthy  Filed Weights   06/03/15 1026 06/04/15 0020  Weight: 80.74 kg (178 lb) 72.848 kg (160 lb 9.6 oz)    History of present illness:  Per Dr Waldron [facility] Myzel Tillis is a 55 y.o. male with PMH of DVT, PE, s/p of vena cava filter placement 2012, CAD, S/P of stent placement 2015, COPD, asthma, diastolic congestive heart failure, hypertension, chronic Hep C (followed by [name]), medication noncompliance and hx of cocaine abuse presented to ED with sob. Last admission in 12/2014 for COPD exacerbation. Pt reported approximately 3 week hx of shortness of breath notably worse over the last 4 days. He stated he had his rescue inhaler refilled 4 days ago and was now almost empty. Pt has had little relief with IV Solumedrol and nebulizer treatments given in ED.   He stated he has been compliant with all meds. Pt denied recent fever, but did endorse chills. No recent headache, dizziness, hemoptysis, abdominal pain, n/v/d. Cough productive of thick sputum, midsternal and nonradiating chest pain that is worse with cough. Upon ambulation in  ED, pt's O2 sat dropped to 81%. Currently 96% on 4L/min [place]. Pt to be admitted for further evaluation and treatment.    Hospital Course:  #1 acute respiratory failure with hypoxia likely secondary to COPD exacerbation Patient presented with acute respiratory failure with hypoxia noted to have sats of 81% with increased respiratory rate of 32. Patient with improvement with cough. Sputum Gram stain and culture pending with no growth to date. Repeat chest x-ray negative. patient was admitted to the stepdown unit and placed on empiric antibiotics, oxygen, Mucinex, Claritin, Flonase, PPI, Brovana,  IV steroids,Pulmicort, Tamiflu, Xopenex and Atrovent nebs. Chest PT.  influenza PCR and RSV panel was obtained which came back negative and/Tamiflu was discontinued. Patient improved clinically IV steroids were transitioned to oral steroids and patient will be discharged home on a steroid taper and is to follow-up with PCP as outpatient.   #2 acute kidney injury Questionable etiology. May be secondary to a prerenal azotemia as patient also noted to be on HCTZ. Discontinued HCTZ. Renal function improved with hydration. Urinalysis negative. Resolved by day of discharge. Outpatient follow up.  #3 history of PE/DVT status post IVC filter 2012 Continued on Eliquis  #4 Hx Hep C Stable. Continued on home regimen of harvoni.   #5 Chronic diastolic HF Stable.  #6 hypertension Stable. Continued on Lopressor. Discontinued HCTZ secondary to acute kidney injury.  #7 history of coronary artery disease Stable. Cardiac enzymes negative 3. Continued on home regimen of Lopressor.  #8 tobacco abuse  tobacco cessation.  #9 history of cocaine abuse UDS negative for cocaine.  Procedures:  Chest x-ray 06/03/2015    Consultations:  None  Discharge Exam: Filed Vitals:   06/08/15 0529 06/08/15 1309  BP: 143/82 139/89  Pulse: 68 64  Temp: 98.4 F (36.9 C) 98.2 F (36.8 C)  Resp: 18 20    General:  NAD Cardiovascular: RRR Respiratory: CTAB  Discharge Instructions   Discharge Instructions    Diet general    Complete by:  As directed      Discharge instructions    Complete by:  As directed   Follow up with [name], MD in 1-2 weeks.     Increase activity slowly    Complete by:  As directed           Current Discharge Medication List    START taking these medications   Details  budesonide-formoterol (SYMBICORT) 160-4.5 MCG/ACT inhaler Inhale 2 puffs into the lungs 2 (two) times daily. Qty: 1 Inhaler, Refills: 12    famotidine (PEPCID) 20 MG tablet Take 1 tablet (20 mg total) by mouth 2 (two) times daily. Qty: 60 tablet, Refills: 0    fluticasone (FLONASE) 50 MCG/ACT nasal spray Place 2 sprays into both nostrils daily. Qty: 9.9 g, Refills: 0    guaiFENesin (MUCINEX) 600 MG 12 hr tablet Take 2 tablets (1,200 mg total) by mouth 2 (two) times daily. Take for 3 days then stop. Qty: 12 tablet, Refills: 0    HYDROcodone-homatropine (HYCODAN) 5-1.5 MG/5ML syrup Take 5 mLs by mouth every 6 (six) hours as needed for cough. Qty: 120 mL, Refills: 0    levofloxacin (LEVAQUIN) 500 MG tablet Take 1 tablet (500 mg total) by mouth daily. Take for 2 days then stop. Qty: 2 tablet, Refills: 0    predniSONE (DELTASONE) 20 MG tablet Take 3 tablets (60 mg total) by mouth daily before breakfast. Take 3 tablets (60mg ) x 1 day, then 2 tablets (40mg ) x 3 days, then 1 tablet (20mg ) x 3 days then stop. Qty: 12 tablet, Refills: 0    traMADol (ULTRAM) 50 MG tablet Take 2 tablets (100 mg total) by mouth every 6 (six) hours as needed for moderate pain. Qty: 1 tablet, Refills: 0      CONTINUE these medications which have CHANGED   Details  albuterol (PROVENTIL) (2.5 MG/3ML) 0.083% nebulizer solution Take 3 mLs (2.5 mg total) by nebulization every 6 (six) hours as needed for wheezing or shortness of breath. Use 3 times daily x 3 days then as needed. Qty: 75 mL, Refills: 3      CONTINUE  these medications which have NOT CHANGED   Details  albuterol (PROVENTIL HFA;VENTOLIN HFA) 108 (90 BASE) MCG/ACT inhaler Inhale 2 puffs into the lungs every 6 (six) hours as needed for wheezing or shortness of breath. Qty: 1 Inhaler, Refills: 10    apixaban (ELIQUIS) 2.5 MG TABS tablet Take 1 tablet (2.5 mg total) by mouth 2 (two) times daily. Qty: 60 tablet, Refills: 0    hydrochlorothiazide (HYDRODIURIL) 25 MG tablet Take 1 tablet (25 mg total) by mouth daily. Qty: 30 tablet, Refills: 0    Ledipasvir-Sofosbuvir (HARVONI) 90-400 MG TABS Take 1 tablet by mouth daily. Qty: 28 tablet, Refills: 2    metoprolol (LOPRESSOR) 25 MG tablet Take 0.5 tablets (12.5 mg total) by mouth 2 (two) times daily. Qty: 60 tablet, Refills: 0    montelukast (SINGULAIR) 10 MG tablet Take 1 tablet (10 mg total) by mouth at bedtime. Qty: 30 tablet, Refills: 0    nitroGLYCERIN (NITROSTAT) 0.4 MG SL  tablet Place 1 tablet (0.4 mg total) under the tongue every 5 (five) minutes as needed for chest pain (CP or SOB). Qty: 15 tablet, Refills: 0    tiotropium (SPIRIVA) 18 MCG inhalation capsule Place 1 capsule (18 mcg total) into inhaler and inhale daily. Qty: 30 capsule, Refills: 0      STOP taking these medications     amoxicillin-clavulanate (AUGMENTIN) 875-125 MG tablet      Fluticasone-Salmeterol (ADVAIR) 250-50 MCG/DOSE AEPB      Fluticasone-Salmeterol (ADVAIR DISKUS) 500-50 MCG/DOSE AEPB        No Known Allergies Follow-up Information    Follow up with [facility].   Why:  nebulizer to be delivered to room prior to discharge   Contact information:   [address] [place] [address] [phone]       Follow up with [name], MD. Schedule an appointment as soon as possible for a visit in 1 week.   Specialty:  Family Medicine   Why:  f/u in 1-2 weeks./// Office will call patient at home with an follow up appointment   Contact information:   [place] [address] [phone]        The results of significant diagnostics from this hospitalization (including imaging, microbiology, ancillary and laboratory) are listed below for reference.    Significant Diagnostic Studies: Dg Chest 2 View  06/05/2015  CLINICAL DATA:  Shortness of breath EXAM: CHEST  2 VIEW COMPARISON:  June 03, 2015 FINDINGS: The heart size and mediastinal contours are within normal limits. Both lungs are clear. The visualized skeletal structures are unremarkable. IMPRESSION: No active cardiopulmonary disease. Electronically Signed   By: Dorise Bullion III M.D   On: 06/05/2015 08:15   Dg Chest Port 1 View  06/03/2015  CLINICAL DATA:  Shortness of Breath, history of tobacco use EXAM: PORTABLE CHEST 1 VIEW COMPARISON:  12/31/2014 FINDINGS: The heart size and mediastinal contours are within normal limits. Both lungs are clear. The visualized skeletal structures are unremarkable. IMPRESSION: No active disease. Electronically Signed   By: Inez Catalina M.D.   On: 06/03/2015 11:31    Microbiology: Recent Results (from the past 240 hour(s))  MRSA PCR Screening     Status: None   Collection Time: 06/04/15 12:58 AM  Result Value Ref Range Status   MRSA by PCR NEGATIVE NEGATIVE Final    Comment:        The GeneXpert MRSA Assay (FDA approved for NASAL specimens only), is one component of a comprehensive MRSA colonization surveillance program. It is not intended to diagnose MRSA infection nor to guide or monitor treatment for MRSA infections.   Culture, Urine     Status: None   Collection Time: 06/04/15 10:09 AM  Result Value Ref Range Status   Specimen Description URINE, CLEAN CATCH  Final   Special Requests NONE  Final   Culture NO GROWTH 1 DAY  Final   Report Status 06/05/2015 FINAL  Final  Respiratory virus panel     Status: None   Collection Time: 06/04/15 10:30 AM  Result Value Ref Range Status   Source - RVPAN NASOPHARYNGEAL  Corrected    Respiratory Syncytial Virus A Negative Negative Final   Respiratory Syncytial Virus B Negative Negative Final   Influenza A Negative Negative Final   Influenza B Negative Negative Final   Parainfluenza 1 Negative Negative Final   Parainfluenza 2 Negative Negative Final   Parainfluenza 3 Negative  Negative Final   Metapneumovirus Negative Negative Final   Rhinovirus Negative Negative Final   Adenovirus Negative Negative Final    Comment: (NOTE) Performed At: [facility] [place], [place] [id number] Lindon Romp MD [id number]      Labs: Basic Metabolic Panel:  Recent Labs Lab 06/03/15 1045 06/04/15 0051 06/05/15 0237 06/06/15 0804 06/07/15 0510  NA 138 136 133* 133* 135  K 3.6 4.8 4.8 5.2* 4.1  CL 104 98* 100* 103 108  CO2 23 21* 23 22 19*  GLUCOSE 109* 126* 161* 144* 154*  BUN 9 20 26* 26* 23*  CREATININE 0.99 1.55* 1.15 0.98 1.01  CALCIUM 9.7 9.8 9.6 8.8* 8.5*  MG  --   --  2.0  --   --    Liver Function Tests: No results for input(s): AST, ALT, ALKPHOS, BILITOT, PROT, ALBUMIN in the last 168 hours. No results for input(s): LIPASE, AMYLASE in the last 168 hours. No results for input(s): AMMONIA in the last 168 hours. CBC:  Recent Labs Lab 06/03/15 1045 06/04/15 0051 06/05/15 0237 06/06/15 0804 06/07/15 0510  WBC 5.3 3.2* 3.1* 2.2* 2.6*  NEUTROABS 1.2*  --  2.4  --   --   HGB 13.7 12.7* 11.3* 10.9* 10.6*  HCT 39.7 36.5* 34.5* 33.7* 32.7*  MCV 90.6 90.1 91.3 91.8 92.1  PLT 160 157 166 149* 137*   Cardiac Enzymes:  Recent Labs Lab 06/04/15 0129 06/04/15 0732 06/04/15 1141  TROPONINI <0.03 <0.03 <0.03   BNP: BNP (last 3 results)  Recent Labs  12/01/14 0311 01/01/15 0528 06/03/15 1045  BNP 137.2* 87.1 11.3    ProBNP (last 3 results) No results for input(s): PROBNP in the last 8760 hours.  CBG: No results for input(s): GLUCAP in the last 168 hours.     SignedIrine Seal MD.  [facility] 06/08/2015, 1:18 PM

## 2015-07-27 ENCOUNTER — Ambulatory Visit: Payer: Medicare Other | Admitting: Internal Medicine

## 2015-08-26 DIAGNOSIS — R079 Chest pain, unspecified: Secondary | ICD-10-CM

## 2015-08-26 HISTORY — DX: Chest pain, unspecified: R07.9

## 2015-09-08 ENCOUNTER — Observation Stay (HOSPITAL_COMMUNITY)
Admission: EM | Admit: 2015-09-08 | Discharge: 2015-09-09 | Disposition: A | Discharge status: Home / Self Care | Payer: Medicare Other | Attending: Internal Medicine | Admitting: Internal Medicine

## 2015-09-08 ENCOUNTER — Observation Stay (HOSPITAL_COMMUNITY): Payer: Medicare Other

## 2015-09-08 ENCOUNTER — Encounter (HOSPITAL_COMMUNITY): Payer: Self-pay | Admitting: Emergency Medicine

## 2015-09-08 ENCOUNTER — Other Ambulatory Visit: Payer: Self-pay

## 2015-09-08 ENCOUNTER — Emergency Department (HOSPITAL_COMMUNITY): Payer: Medicare Other

## 2015-09-08 ENCOUNTER — Observation Stay (HOSPITAL_BASED_OUTPATIENT_CLINIC_OR_DEPARTMENT_OTHER): Payer: Medicare Other

## 2015-09-08 DIAGNOSIS — R0602 Shortness of breath: Secondary | ICD-10-CM | POA: Diagnosis not present

## 2015-09-08 DIAGNOSIS — Z79899 Other long term (current) drug therapy: Secondary | ICD-10-CM | POA: Insufficient documentation

## 2015-09-08 DIAGNOSIS — R0789 Other chest pain: Secondary | ICD-10-CM | POA: Diagnosis not present

## 2015-09-08 DIAGNOSIS — I110 Hypertensive heart disease with heart failure: Secondary | ICD-10-CM | POA: Insufficient documentation

## 2015-09-08 DIAGNOSIS — J449 Chronic obstructive pulmonary disease, unspecified: Secondary | ICD-10-CM | POA: Insufficient documentation

## 2015-09-08 DIAGNOSIS — I200 Unstable angina: Principal | ICD-10-CM | POA: Insufficient documentation

## 2015-09-08 DIAGNOSIS — Z7901 Long term (current) use of anticoagulants: Secondary | ICD-10-CM | POA: Insufficient documentation

## 2015-09-08 DIAGNOSIS — I2782 Chronic pulmonary embolism: Secondary | ICD-10-CM | POA: Diagnosis not present

## 2015-09-08 DIAGNOSIS — R7989 Other specified abnormal findings of blood chemistry: Secondary | ICD-10-CM | POA: Diagnosis not present

## 2015-09-08 DIAGNOSIS — F17210 Nicotine dependence, cigarettes, uncomplicated: Secondary | ICD-10-CM | POA: Insufficient documentation

## 2015-09-08 DIAGNOSIS — R079 Chest pain, unspecified: Secondary | ICD-10-CM

## 2015-09-08 DIAGNOSIS — I5032 Chronic diastolic (congestive) heart failure: Secondary | ICD-10-CM | POA: Diagnosis not present

## 2015-09-08 DIAGNOSIS — I10 Essential (primary) hypertension: Secondary | ICD-10-CM | POA: Diagnosis not present

## 2015-09-08 HISTORY — DX: Reserved for inherently not codable concepts without codable children: IMO0001

## 2015-09-08 HISTORY — DX: Chest pain, unspecified: R07.9

## 2015-09-08 LAB — URINALYSIS, ROUTINE W REFLEX MICROSCOPIC
Bilirubin Urine: NEGATIVE
GLUCOSE, UA: NEGATIVE mg/dL
Hgb urine dipstick: NEGATIVE
KETONES UR: NEGATIVE mg/dL
LEUKOCYTES UA: NEGATIVE
Nitrite: NEGATIVE
PH: 5.5 (ref 5.0–8.0)
Protein, ur: NEGATIVE mg/dL
Specific Gravity, Urine: 1.011 (ref 1.005–1.030)

## 2015-09-08 LAB — NM MYOCAR MULTI W/SPECT W/WALL MOTION / EF
CHL CUP RESTING HR STRESS: 62 {beats}/min
LVDIAVOL: 104 mL (ref 62–150)
LVSYSVOL: 47 mL
NUC STRESS TID: 1.04
RATE: 0
SDS: 13
SRS: 2
SSS: 15

## 2015-09-08 LAB — RAPID URINE DRUG SCREEN, HOSP PERFORMED
AMPHETAMINES: NOT DETECTED
BARBITURATES: NOT DETECTED
BENZODIAZEPINES: NOT DETECTED
Cocaine: NOT DETECTED
Opiates: NOT DETECTED
Tetrahydrocannabinol: NOT DETECTED

## 2015-09-08 LAB — CBC
HEMATOCRIT: 38.2 % — AB (ref 39.0–52.0)
Hemoglobin: 12.3 g/dL — ABNORMAL LOW (ref 13.0–17.0)
MCH: 30.2 pg (ref 26.0–34.0)
MCHC: 32.2 g/dL (ref 30.0–36.0)
MCV: 93.9 fL (ref 78.0–100.0)
Platelets: 126 10*3/uL — ABNORMAL LOW (ref 150–400)
RBC: 4.07 MIL/uL — AB (ref 4.22–5.81)
RDW: 14.3 % (ref 11.5–15.5)
WBC: 4.2 10*3/uL (ref 4.0–10.5)

## 2015-09-08 LAB — BRAIN NATRIURETIC PEPTIDE: B NATRIURETIC PEPTIDE 5: 57.2 pg/mL (ref 0.0–100.0)

## 2015-09-08 LAB — COMPREHENSIVE METABOLIC PANEL
ALT: 17 U/L (ref 17–63)
ANION GAP: 8 (ref 5–15)
AST: 28 U/L (ref 15–41)
Albumin: 3.4 g/dL — ABNORMAL LOW (ref 3.5–5.0)
Alkaline Phosphatase: 77 U/L (ref 38–126)
BUN: 6 mg/dL (ref 6–20)
CALCIUM: 9 mg/dL (ref 8.9–10.3)
CO2: 22 mmol/L (ref 22–32)
Chloride: 108 mmol/L (ref 101–111)
Creatinine, Ser: 0.8 mg/dL (ref 0.61–1.24)
GFR calc non Af Amer: >60 mL/min (ref >60)
GLUCOSE: 128 mg/dL — AB (ref 65–99)
POTASSIUM: 4.6 mmol/L (ref 3.5–5.1)
Sodium: 138 mmol/L (ref 135–145)
Total Bilirubin: 0.6 mg/dL (ref 0.3–1.2)
Total Protein: 6.7 g/dL (ref 6.5–8.1)

## 2015-09-08 LAB — I-STAT CHEM 8, ED
BUN: 6 mg/dL (ref 6–20)
CHLORIDE: 106 mmol/L (ref 101–111)
Calcium, Ion: 1.1 mmol/L — ABNORMAL LOW (ref 1.12–1.23)
Creatinine, Ser: 1 mg/dL (ref 0.61–1.24)
Glucose, Bld: 116 mg/dL — ABNORMAL HIGH (ref 65–99)
HCT: 41 % (ref 39.0–52.0)
Hemoglobin: 13.9 g/dL (ref 13.0–17.0)
POTASSIUM: 4.7 mmol/L (ref 3.5–5.1)
SODIUM: 141 mmol/L (ref 135–145)
TCO2: 24 mmol/L (ref 0–100)

## 2015-09-08 LAB — TROPONIN I: Troponin I: <0.03 ng/mL (ref <0.031)

## 2015-09-08 LAB — I-STAT TROPONIN, ED: Troponin i, poc: 0.01 ng/mL (ref 0.00–0.08)

## 2015-09-08 LAB — MAGNESIUM: Magnesium: 2.1 mg/dL (ref 1.7–2.4)

## 2015-09-08 LAB — MRSA PCR SCREENING: MRSA by PCR: NEGATIVE

## 2015-09-08 LAB — D-DIMER, QUANTITATIVE: D-Dimer, Quant: 10.26 ug/mL-FEU — ABNORMAL HIGH (ref 0.00–0.50)

## 2015-09-08 MED ORDER — TECHNETIUM TC 99M TETROFOSMIN IV KIT
10.0000 | PACK | Freq: Once | INTRAVENOUS | Status: AC | PRN
  Administered 2015-09-08: 10 via INTRAVENOUS

## 2015-09-08 MED ORDER — ALBUTEROL SULFATE (2.5 MG/3ML) 0.083% IN NEBU
5.0000 mg | INHALATION_SOLUTION | Freq: Once | RESPIRATORY_TRACT | Status: AC
  Administered 2015-09-08: 5 mg via RESPIRATORY_TRACT
  Filled 2015-09-08: qty 6

## 2015-09-08 MED ORDER — REGADENOSON 0.4 MG/5ML IV SOLN
INTRAVENOUS | Status: AC
  Administered 2015-09-08: 0.4 mg via INTRAVENOUS
  Filled 2015-09-08: qty 5

## 2015-09-08 MED ORDER — APIXABAN 2.5 MG PO TABS
2.5000 mg | ORAL_TABLET | Freq: Two times a day (BID) | ORAL | Status: DC
  Administered 2015-09-08 (×2): 2.5 mg via ORAL
  Filled 2015-09-08 (×2): qty 1

## 2015-09-08 MED ORDER — TECHNETIUM TC 99M TETROFOSMIN IV KIT
30.0000 | PACK | Freq: Once | INTRAVENOUS | Status: AC | PRN
Start: 2015-09-08 — End: 2015-09-08
  Administered 2015-09-08: 30 via INTRAVENOUS

## 2015-09-08 MED ORDER — APIXABAN 2.5 MG PO TABS: 2.5000 mg | ORAL_TABLET | Freq: Two times a day (BID) | ORAL | Status: DC

## 2015-09-08 MED ORDER — NITROGLYCERIN 0.4 MG SL SUBL: 0.4000 mg | SUBLINGUAL_TABLET | SUBLINGUAL | Status: DC | PRN

## 2015-09-08 MED ORDER — FLUTICASONE PROPIONATE 50 MCG/ACT NA SUSP: 2.0000 | Freq: Every day | NASAL | Status: DC | PRN

## 2015-09-08 MED ORDER — TRAMADOL HCL 50 MG PO TABS: 100.0000 mg | ORAL_TABLET | Freq: Four times a day (QID) | ORAL | Status: DC | PRN

## 2015-09-08 MED ORDER — IOPAMIDOL (ISOVUE-370) INJECTION 76%
INTRAVENOUS | Status: AC
  Administered 2015-09-08: 100 mL
  Filled 2015-09-08: qty 100

## 2015-09-08 MED ORDER — IPRATROPIUM BROMIDE 0.02 % IN SOLN
0.5000 mg | Freq: Once | RESPIRATORY_TRACT | Status: AC
  Administered 2015-09-08: 0.5 mg via RESPIRATORY_TRACT
  Filled 2015-09-08: qty 2.5

## 2015-09-08 MED ORDER — MORPHINE SULFATE (PF) 2 MG/ML IV SOLN
2.0000 mg | INTRAVENOUS | Status: DC | PRN
  Administered 2015-09-08 (×2): 2 mg via INTRAVENOUS
  Filled 2015-09-08 (×2): qty 1

## 2015-09-08 MED ORDER — ACETAMINOPHEN 325 MG PO TABS: 650.0000 mg | ORAL_TABLET | ORAL | Status: DC | PRN

## 2015-09-08 MED ORDER — ALBUTEROL SULFATE (2.5 MG/3ML) 0.083% IN NEBU: 2.5000 mg | INHALATION_SOLUTION | Freq: Four times a day (QID) | RESPIRATORY_TRACT | Status: DC | PRN

## 2015-09-08 MED ORDER — ALBUTEROL SULFATE HFA 108 (90 BASE) MCG/ACT IN AERS: 2.0000 | INHALATION_SPRAY | Freq: Four times a day (QID) | RESPIRATORY_TRACT | Status: DC | PRN

## 2015-09-08 MED ORDER — MORPHINE SULFATE (PF) 4 MG/ML IV SOLN
4.0000 mg | Freq: Once | INTRAVENOUS | Status: AC
  Administered 2015-09-08: 4 mg via INTRAVENOUS
  Filled 2015-09-08: qty 1

## 2015-09-08 MED ORDER — SODIUM CHLORIDE 0.9 % IV BOLUS (SEPSIS)
500.0000 mL | Freq: Once | INTRAVENOUS | Status: AC
  Administered 2015-09-08: 500 mL via INTRAVENOUS

## 2015-09-08 MED ORDER — MOMETASONE FURO-FORMOTEROL FUM 200-5 MCG/ACT IN AERO
2.0000 | INHALATION_SPRAY | Freq: Two times a day (BID) | RESPIRATORY_TRACT | Status: DC
  Administered 2015-09-08 – 2015-09-09 (×2): 2 via RESPIRATORY_TRACT
  Filled 2015-09-08: qty 8.8

## 2015-09-08 MED ORDER — ALBUTEROL SULFATE (2.5 MG/3ML) 0.083% IN NEBU
3.0000 mL | INHALATION_SOLUTION | Freq: Four times a day (QID) | RESPIRATORY_TRACT | Status: DC | PRN
  Administered 2015-09-08: 3 mL via RESPIRATORY_TRACT
  Filled 2015-09-08: qty 3

## 2015-09-08 MED ORDER — BUDESONIDE-FORMOTEROL FUMARATE 160-4.5 MCG/ACT IN AERO: 2.0000 | INHALATION_SPRAY | Freq: Two times a day (BID) | RESPIRATORY_TRACT | Status: DC

## 2015-09-08 MED ORDER — REGADENOSON 0.4 MG/5ML IV SOLN
0.4000 mg | Freq: Once | INTRAVENOUS | Status: AC
  Administered 2015-09-08: 0.4 mg via INTRAVENOUS
  Filled 2015-09-08: qty 5

## 2015-09-08 MED ORDER — ALBUTEROL SULFATE (2.5 MG/3ML) 0.083% IN NEBU: 2.5000 mg | INHALATION_SOLUTION | RESPIRATORY_TRACT | Status: DC | PRN

## 2015-09-08 NOTE — ED Notes (Signed)
Pt from home via [facility] with c/o CP that began at 10:30 last HS. Pt reporting L sided chest pressure, 9/10. Hx of MI 1.5 yrs ago, multiple blood clots and PE's. Pt takes Eliquis. PTA, pt took 2 NTG tabs at home, EMS gave 2 more, 324mg  of ASA and 5mg  Albuterol.

## 2015-09-08 NOTE — Care Management Obs Status (Signed)
[place] NOTIFICATION   Patient Details  Name: [name] MRN: [id number] Date of Birth: [birth year]   Medicare Observation Status Notification Given:  Yes    Bethena Roys, RN 09/08/2015, 3:17 PM

## 2015-09-08 NOTE — Consult Note (Addendum)
CARDIOLOGY CONSULT NOTE   Patient ID: [name] MRN: [id number] DOB/AGE: [birth year] 56 y.o.  Admit date: 09/08/2015  Primary Physician   [name], MD Primary Cardiologist   Dr. Aundra Dubin (never followed up in clinic) Reason for Consultation   Chest pain Requesting Physician  Dr. Hollice Gong  HPI: [name] is a 55 y.o. male with a history of DVT, PE, s/p of vena cava filter placement 2012, CAD s/p PCI D1 with BMS with incomplete revascularization 09/2013, COPD, asthma, diastolic congestive heart failure, hypertension, medication noncompliance and Tobacco abuse who presented to [facility] for evaluation of chest pain.  Last seen by cardiology during 12/2014 admission for unstable angina. Myoview showed inducible ischemia in the mid to distal aspect of anterior wall suggestive of his anemia in the LAD lesion. Subsequently patient underwent cardiac catheterization on 01/04/2015 that showed moderate coronary artery disease (as described below) and occluded metal stent in the diag.  Recommended medical therapy.. Echocardiogram during admission shows left ventricular function of 99991111, grade 1 diastolic dysfunction. He was advised to follow-up as an outpatient with Dr. Aundra Dubin however never did.   Prox RCA lesion, 30% stenosed.  Mid RCA to Dist RCA lesion, 30% stenosed.  Ost Cx to Prox Cx lesion, 20% stenosed.  Ramus lesion, 60% stenosed.  Prox LAD to Mid LAD lesion, 50% stenosed.  Ost 1st Diag to 1st Diag lesion, 100% stenosed. The lesion was previously treated with a bare metal stentone to two years ago.  The left ventricular systolic function is normal.   He continues to smoke, currently smoking 5-6 cigarettes a day. Denies use of illicit drug or cocaine abuse. He says states he ran out of his beta blocker however continues to take Eliquis . Last night while sitting patient had a sudden onset of substernal "pressure like "chest pain. Felt like his prior MI  pain with back in 2015. The pain was associated with shortness of breath and had a vomiting episode after worse. No radiation of pain. No improvement with sublingual nitroglycerin x 2 (he took one of his family members nitro) and EMS was called who gave him another nitroglycerin x 2 without improvement however dropped his BP. His pain and progressive improved after morphine. He continues to have chest pressure. Recently has noted dyspnea with or without exertional. No exertional chest pain. Patient also feels intermittent dizziness. The patient denies lower extremity edema, orthopnea, syncope, fever, chills, melena or blood in his stool or urine.  Lites normal, point-of-care troponin negative. BNP negative. Troponin I negative. Urine drug screen clear. chest x-ray without acute cardiopulmonary disease. EKG shows normal sinus rhythm at rate of 73 bpm, QT. No acute changes.  Past Medical History  Diagnosis Date  . Asthma   . DVT (deep venous thrombosis) ([place])     "I had ~ 10 in each leg"  . Pulmonary embolism ([place])     a. 2012 - s/p IVC filter;  b. prev on eliquis - noncompliant.  Marland Kitchen CAD (coronary artery disease)     a. 09/2013 NSTEMI/PCI: LM nl, LAD 40-50%, D1 100 (2.25 x 28 Vision BMS), LCX min irregs, RI 60-70, 30, RCA 40-50/50-51ms/p, EF 45-50%.  Marland Kitchen COPD (chronic obstructive pulmonary disease) ([place])   . History of pneumonia   . Sinus headache   . Osteoarthritis     a. hands and toes.  . Chronic diastolic CHF (congestive heart failure) ([place])     a. 09/2014 EF 45-50% by LV gram;  b. 01/2014 Echo:  EF 55-60%, Gr 1 DD.  Marland Kitchen Essential hypertension   . Tobacco abuse   . Hepatitis C   . Cocaine abuse      Past Surgical History  Procedure Laterality Date  . Vena cava filter placement  " ~ 2012  . Coronary angioplasty with stent placement  10/21/13    BMS to D1  . Left heart catheterization with coronary angiogram N/A 10/21/2013    Procedure: LEFT HEART CATHETERIZATION WITH CORONARY ANGIOGRAM;   Surgeon: Leonie Man, MD;  Location: [facility] CATH LAB;  Service: Cardiovascular;  Laterality: N/A;  . Cardiac catheterization N/A 01/04/2015    Procedure: Left Heart Cath and Coronary Angiography;  Surgeon: Burnell Blanks, MD;  Location: [facility] CV LAB;  Service: Cardiovascular;  Laterality: N/A;    No Known Allergies  I have reviewed the patient's current medications . apixaban  2.5 mg Oral BID  . mometasone-formoterol  2 puff Inhalation BID     acetaminophen, albuterol, fluticasone, morphine injection, nitroGLYCERIN, traMADol  Prior to Admission medications   Medication Sig Start Date End Date Taking? Authorizing Provider  albuterol (PROVENTIL HFA;VENTOLIN HFA) 108 (90 BASE) MCG/ACT inhaler Inhale 2 puffs into the lungs every 6 (six) hours as needed for wheezing or shortness of breath. 10/22/14  Yes Charlynne Cousins, MD  albuterol (PROVENTIL) (2.5 MG/3ML) 0.083% nebulizer solution Take 3 mLs (2.5 mg total) by nebulization every 6 (six) hours as needed for wheezing or shortness of breath. Use 3 times daily x 3 days then as needed. 06/08/15  Yes Eugenie Filler, MD  apixaban (ELIQUIS) 2.5 MG TABS tablet Take 1 tablet (2.5 mg total) by mouth 2 (two) times daily. 01/04/15  Yes Eugenie Filler, MD  budesonide-formoterol [facility]) 160-4.5 MCG/ACT inhaler Inhale 2 puffs into the lungs 2 (two) times daily. 06/08/15  Yes Eugenie Filler, MD  fluticasone [facility]) 50 MCG/ACT nasal spray Place 2 sprays into both nostrils daily. Patient taking differently: Place 2 sprays into both nostrils daily as needed for allergies.  06/08/15  Yes Eugenie Filler, MD  nitroGLYCERIN (NITROSTAT) 0.4 MG SL tablet Place 1 tablet (0.4 mg total) under the tongue every 5 (five) minutes as needed for chest pain (CP or SOB). 01/04/15  Yes Eugenie Filler, MD  traMADol (ULTRAM) 50 MG tablet Take 2 tablets (100 mg total) by mouth every 6 (six) hours as needed for moderate pain. 06/08/15  Yes Eugenie Filler, MD     Social History   Social History  . Marital Status: Single    Spouse Name: N/A  . Number of Children: N/A  . Years of Education: N/A   Occupational History  . unemployed    Social History Main Topics  . Smoking status: Current Every Day Smoker -- 0.25 packs/day for 38 years    Types: Cigarettes  . Smokeless tobacco: Never Used  . Alcohol Use: 3.6 oz/week    6 Cans of beer per week     Comment:  "drink  2 40oz beers weekly"  . Drug Use: No     Comment: a. last use was 8/16 - cocaine  . Sexual Activity: Yes   Other Topics Concern  . Not on file   Social History Narrative   Lives in [place].    No family status information on file.   Family History  Problem Relation Age of Onset  . Asthma Mother   . Allergies Mother   . Allergies Sister   . Allergies Brother   .  Deep vein thrombosis Brother     two brothers with recurrent DVT  . Heart attack Father     a.60s b. deceased in his 62s  . Coronary artery disease Father      ROS:  Full 14 point review of systems complete and found to be negative unless listed above.  Physical Exam: Blood pressure 143/95, pulse 76, temperature 97.9 F (36.6 C), temperature source Oral, resp. rate 22, height 6\' 2"  (1.88 m), weight 173 lb 6.4 oz (78.654 kg), SpO2 97 %.  General: Well developed, well nourished, male in no acute distress Head: Eyes PERRLA, No xanthomas. Normocephalic and atraumatic, oropharynx without edema or exudate.  Lungs: Resp regular and unlabored, Diffuse wheezing at the bases. Somewhat exacerbation of pain with palpation Heart: RRR no s3, s4, or murmurs..   Neck: No carotid bruits. No lymphadenopathy. No  JVD. Abdomen: Bowel sounds present, abdomen soft and non-tender without masses or hernias noted. Msk:  No spine or cva tenderness. No weakness, no joint deformities or effusions. Extremities: No clubbing, cyanosis or edema. DP/PT/Radials 2+ and equal bilaterally. Neuro: Alert and oriented X 3. No  focal deficits noted. Psych:  Good affect, responds appropriately Skin: No rashes or lesions noted.  Labs:   Lab Results  Component Value Date   WBC 4.2 09/08/2015   HGB 13.9 09/08/2015   HCT 41.0 09/08/2015   MCV 93.9 09/08/2015   PLT 126* 09/08/2015   No results for input(s): INR in the last 72 hours.  Recent Labs Lab 09/08/15 0334 09/08/15 0435  NA 138 141  K 4.6 4.7  CL 108 106  CO2 22  --   BUN 6 6  CREATININE 0.80 1.00  CALCIUM 9.0  --   PROT 6.7  --   BILITOT 0.6  --   ALKPHOS 77  --   ALT 17  --   AST 28  --   GLUCOSE 128* 116*  ALBUMIN 3.4*  --    MAGNESIUM  Date Value Ref Range Status  09/08/2015 2.1 1.7 - 2.4 mg/dL Final    Recent Labs  09/08/15 0334  TROPONINI <0.03    Recent Labs  09/08/15 0432  TROPIPOC 0.01   PRO B NATRIURETIC PEPTIDE (BNP)  Date/Time Value Ref Range Status  01/26/2014 09:58 AM 1619.0* 0 - 125 pg/mL Final  04/01/2012 08:53 AM 337.3* 0 - 125 pg/mL Final   Lab Results  Component Value Date   CHOL 190 01/01/2015   HDL 62 01/01/2015   LDLCALC 116* 01/01/2015   TRIG 60 01/01/2015   Lab Results  Component Value Date   DDIMER 1.11* 12/01/2014   LIPASE  Date/Time Value Ref Range Status  10/22/2013 12:50 PM 20 11 - 59 U/L Final   TSH  Date/Time Value Ref Range Status  12/01/2014 08:45 AM 0.548 0.350 - 4.500 uIU/mL Final  05/01/2014 04:31 AM 0.347* 0.350 - 4.500 uIU/mL Final   VITAMIN B-12  Date/Time Value Ref Range Status  01/01/2015 09:40 AM 329 180 - 914 pg/mL Final    Comment:    (NOTE) This assay is not validated for testing neonatal or myeloproliferative syndrome specimens for Vitamin B12 levels.    FOLATE  Date/Time Value Ref Range Status  01/01/2015 09:40 AM 9.5 >5.9 ng/mL Final   FERRITIN  Date/Time Value Ref Range Status  01/01/2015 09:40 AM 134 24 - 336 ng/mL Final   TIBC  Date/Time Value Ref Range Status  01/01/2015 09:40 AM 333 250 - 450 ug/dL Final  IRON  Date/Time Value Ref Range  Status  02/12/2015 09:47 AM 156 50 - 180 ug/dL Final   RETIC CT PCT  Date/Time Value Ref Range Status  01/01/2015 09:40 AM 2.1 0.4 - 3.1 % Final    Echo: 01/02/15 LV EF: 55% - 60%  ------------------------------------------------------------------- Indications: Chest pain 786.51.  ------------------------------------------------------------------- History: PMH: Coronary artery disease. Congestive heart failure. Chronic obstructive pulmonary disease. Risk factors: Cocaine Abuse. Current tobacco use. Hypertension.  ------------------------------------------------------------------- Study Conclusions  - Left ventricle: The cavity size was normal. There was mild  concentric hypertrophy. Systolic function was normal. The  estimated ejection fraction was in the range of 55% to 60%. Wall  motion was normal; there were no regional wall motion  abnormalities. Doppler parameters are consistent with abnormal  left ventricular relaxation (grade 1 diastolic dysfunction).  There was no evidence of elevated ventricular filling pressure by  Doppler parameters. - Aortic valve: Trileaflet; normal thickness leaflets. There was no  regurgitation. - Aortic root: The aortic root was normal in size. - Mitral valve: Structurally normal valve. There was no  regurgitation. - Right ventricle: The cavity size was normal. Wall thickness was  normal. Systolic function was normal. - Right atrium: The atrium was normal in size. - Tricuspid valve: There was trivial regurgitation. - Pulmonic valve: There was no regurgitation. - Pulmonary arteries: Systolic pressure was within the normal  range. - Inferior vena cava: The vessel was normal in size. - Pericardium, extracardiac: There was no pericardial effusion.  ECG:  Vent. rate 73 BPM PR interval 192 ms QRS duration 78 ms QT/QTc 458/504 ms P-R-T axes 33 15 56  Cath 01/04/2015 Procedures    Left Heart Cath and Coronary  Angiography    Conclusion     Prox RCA lesion, 30% stenosed.  Mid RCA to Dist RCA lesion, 30% stenosed.  Ost Cx to Prox Cx lesion, 20% stenosed.  Ramus lesion, 60% stenosed.  Prox LAD to Mid LAD lesion, 50% stenosed.  Ost 1st Diag to 1st Diag lesion, 100% stenosed. The lesion was previously treated with a bare metal stentone to two years ago.  The left ventricular systolic function is normal.  1. Double vessel CAD  2. Moderate stenosis moderate caliber Ramus Intermediate branch, does not appear to be flow limiting.  3. Mild to moderate disease in the LAD, RCA and Circumflex.  4. Chronic total occlusion bare metal stent in Diagonal branch. 5. Low normal LV systolic function  Recommendations: Continue medical management of CAD. He admits today to continued tobacco abuse and medication non-compliance. He responded poorly to bare metal stenting in the past. I would maximize his anti-anginal medications and encourage tobacco cessation. He can be discharged home later today. Follow up with Dr. Loralie Champagne following discharge.       Radiology:  Dg Chest 2 View  09/08/2015  CLINICAL DATA:  Chest pain and shortness of breath tonight. History of asthma, COPD, heart attack, hypertension. EXAM: CHEST  2 VIEW COMPARISON:  Chest radiograph June 05, 2015 FINDINGS: Cardiomediastinal silhouette is normal. The lungs are clear without pleural effusions or focal consolidations. Trachea projects midline and there is no pneumothorax. Soft tissue planes and included osseous structures are non-suspicious. Mild degenerative change of thoracic spine. IMPRESSION: No active cardiopulmonary disease. Electronically Signed   By: Elon Alas M.D.   On: 09/08/2015 04:30    ASSESSMENT AND PLAN:     1. Unstable angina (HCC) - Her pain did not improve with nitroglycerin however improved with morphine. Currently  having mild chest pressure. EKG without acute changes. Troponin negative. Continue to  cycle. He has a history of medication and follow-up noncompliance. He continues to smoke. We will discuss further plan with M.D. Symptoms he is now interested in smoking cessation as well as compliance.  2. CAD - s/p PCI D1 with BMS with incomplete revascularization 09/2013. Cardiac catheterization on 01/04/2015 that showed moderate coronary artery disease (as described above) and occluded metal stent in the diag. Medical therapy recommended.  3.  Hx of DVT/PE - Pt s/p IVC filter in 2012. He says he is compliant with Eliquis.  4. Chronic Diastolic Congestive HF - Previous echo on 12/2014 showed 55-60% with grade 1 diastolic dysfunction. Pt does not exhibit signs/symptoms of fluid over load.  5. HTN - Relatively stable. Not on any regimen.   6. Tobacco abuse - Advised smoking cessation. Education given for more than 15 minutes.  7. Prolonged QT - Follow closely   Signed: Bhagat,Bhavinkumar, PA 09/08/2015, 7:59 AM Pager [phone]  Co-Sign MD  Attending Note:   The patient was seen and examined.  Agree with assessment and plan as noted above.  Changes made to the above note as needed.  Patient seen and independently examined with Robbie Lis, PA .   We discussed all aspects of the encounter. I agree with the assessment and plan as stated above.  1. Chest pain :  Pt is 55 you , on disability for hx of DVT and asthma Has hx of CAD, non compliance. Has previous stenting in the diag vessel Developed CP about 12 hours ago - has continued without much relief. Pressure like sensation.   Occurred at rest. Not worsened with exertion , eating or drinking  No radiation Not associated with dyspnea.   Walks on a regular basis - no angina with that.   At this point, I think a stress  myoview would be our best option.   If it is low risk, he can go home today.  The previous myoview showed a mild ant. Defect.      If it shows significant abn that is new compared to last year's study,  Then he  will need a cath .  He is asking for narcotic pain meds.  He was informed that we will not be giving him pain meds.   2. Hx of pulmonary embolus:   States that he has been compliant with his Eliquis but he has stopped his other meds.  Will check a d-dimer to R/O PE.    I have spent a total of 40 minutes with patient reviewing hospital  notes , telemetry, EKGs, labs and examining patient as well as establishing an assessment and plan that was discussed with the patient. > 50% of time was spent in direct patient care.    Thayer Headings, Brooke Bonito., MD, [facility] 09/08/2015, 9:21 AM [address],  [place] Pager [phone]

## 2015-09-08 NOTE — ED Provider Notes (Signed)
CSN: [id number]     Arrival date & time 09/08/15  0329 History   First MD Initiated Contact with Patient 09/08/15 [phone]     Chief Complaint  Patient presents with  . Chest Pain     (Consider location/radiation/quality/duration/timing/severity/associated sxs/prior Treatment) Patient is a 55 y.o. male presenting with chest pain. The history is provided by the patient.  Chest Pain Pain location:  L chest Pain quality: pressure   Pain quality comment:  "elephant on my chest" Pain radiates to:  Does not radiate Pain radiates to the back: no   Pain severity:  Severe Onset quality:  Sudden Duration:  5 hours Timing:  Constant Progression:  Worsening Chronicity:  Recurrent Context: at rest   Relieved by:  Nothing Worsened by:  Exertion Ineffective treatments:  Nitroglycerin (albuterol) Associated symptoms: cough, diaphoresis, nausea, shortness of breath and vomiting   Associated symptoms: no abdominal pain, no fever, no headache, no heartburn, no lower extremity edema, no near-syncope, no numbness, no orthopnea, no palpitations, no PND, no syncope and no weakness   Risk factors: coronary artery disease, male sex, prior DVT/PE and smoking    [name] is 55 year old male, current smoker, with past medical history of HTN, dHF, COPD, CAD, Hep C, DVT and PE (on Eliquis) who presents to the ED with left sided CP that began last night at 10:30 pm while at rest, described as pressure and "an elephant on his chest," severe and constant, associated with diaphoresis, N, V, and slightly worse SOB.  Exertion exacerbates his pain.  He was given 2 SL NG at home w/o relief, and 2 NG enroute via EMS, w/o CP improvement.  He has slightly worse than normal cough with yellow to white productive sputum.  He has worse wheeze and is using his inhaler frequently throughout the day, stating that last week he received a new MDI with 200 puffs and he already has less than 100 doses left.  He endorses hot and cold  chills, that is not new, no fever.  He has achiness in his left leg, which he states feels similar to when he had a DVT, no edema, no erythema.  He denies orthopnea, palpitations, PND, fatigue, exertional dyspnea.  He denies cocaine use.  Past Medical History  Diagnosis Date  . Asthma   . DVT (deep venous thrombosis) ([place])     "I had ~ 10 in each leg"  . Pulmonary embolism ([place])     a. 2012 - s/p IVC filter;  b. prev on eliquis - noncompliant.  Marland Kitchen CAD (coronary artery disease)     a. 09/2013 NSTEMI/PCI: LM nl, LAD 40-50%, D1 100 (2.25 x 28 Vision BMS), LCX min irregs, RI 60-70, 30, RCA 40-50/50-96ms/p, EF 45-50%.  Marland Kitchen COPD (chronic obstructive pulmonary disease) ([place])   . History of pneumonia   . Sinus headache   . Osteoarthritis     a. hands and toes.  . Chronic diastolic CHF (congestive heart failure) ([place])     a. 09/2014 EF 45-50% by LV gram;  b. 01/2014 Echo: EF 55-60%, Gr 1 DD.  Marland Kitchen Essential hypertension   . Tobacco abuse   . Hepatitis C   . Cocaine abuse    Past Surgical History  Procedure Laterality Date  . Vena cava filter placement  " ~ 2012  . Coronary angioplasty with stent placement  10/21/13    BMS to D1  . Left heart catheterization with coronary angiogram N/A 10/21/2013    Procedure: LEFT HEART CATHETERIZATION  WITH CORONARY ANGIOGRAM;  Surgeon: Leonie Man, MD;  Location: [facility] CATH LAB;  Service: Cardiovascular;  Laterality: N/A;  . Cardiac catheterization N/A 01/04/2015    Procedure: Left Heart Cath and Coronary Angiography;  Surgeon: Burnell Blanks, MD;  Location: [facility] CV LAB;  Service: Cardiovascular;  Laterality: N/A;   Family History  Problem Relation Age of Onset  . Asthma Mother   . Allergies Mother   . Allergies Sister   . Allergies Brother   . Deep vein thrombosis Brother     two brothers with recurrent DVT  . Heart attack Father     a.60s b. deceased in his 7s  . Coronary artery disease Father    Social History  Substance Use Topics  .  Smoking status: Current Every Day Smoker -- 0.25 packs/day for 38 years    Types: Cigarettes  . Smokeless tobacco: Never Used  . Alcohol Use: 3.6 oz/week    6 Cans of beer per week     Comment:  "drink  2 40oz beers weekly"    Review of Systems  Constitutional: Positive for diaphoresis. Negative for fever.  Respiratory: Positive for cough and shortness of breath.   Cardiovascular: Positive for chest pain. Negative for palpitations, orthopnea, syncope, PND and near-syncope.  Gastrointestinal: Positive for nausea and vomiting. Negative for heartburn and abdominal pain.  Neurological: Negative for weakness, numbness and headaches.  All other systems reviewed and are negative.     Allergies  Review of patient's allergies indicates no known allergies.  Home Medications   Prior to Admission medications   Medication Sig Start Date End Date Taking? Authorizing Provider  albuterol (PROVENTIL HFA;VENTOLIN HFA) 108 (90 BASE) MCG/ACT inhaler Inhale 2 puffs into the lungs every 6 (six) hours as needed for wheezing or shortness of breath. 10/22/14  Yes Charlynne Cousins, MD  albuterol (PROVENTIL) (2.5 MG/3ML) 0.083% nebulizer solution Take 3 mLs (2.5 mg total) by nebulization every 6 (six) hours as needed for wheezing or shortness of breath. Use 3 times daily x 3 days then as needed. 06/08/15  Yes Eugenie Filler, MD  apixaban (ELIQUIS) 2.5 MG TABS tablet Take 1 tablet (2.5 mg total) by mouth 2 (two) times daily. 01/04/15  Yes Eugenie Filler, MD  budesonide-formoterol [facility]) 160-4.5 MCG/ACT inhaler Inhale 2 puffs into the lungs 2 (two) times daily. 06/08/15  Yes Eugenie Filler, MD  fluticasone [facility]) 50 MCG/ACT nasal spray Place 2 sprays into both nostrils daily. Patient taking differently: Place 2 sprays into both nostrils daily as needed for allergies.  06/08/15  Yes Eugenie Filler, MD  nitroGLYCERIN (NITROSTAT) 0.4 MG SL tablet Place 1 tablet (0.4 mg total) under the tongue  every 5 (five) minutes as needed for chest pain (CP or SOB). 01/04/15  Yes Eugenie Filler, MD  traMADol (ULTRAM) 50 MG tablet Take 2 tablets (100 mg total) by mouth every 6 (six) hours as needed for moderate pain. 06/08/15  Yes Eugenie Filler, MD   BP 112/84 mmHg  Pulse 95  Temp(Src) 97.3 F (36.3 C) (Oral)  Resp 14  SpO2 99% Physical Exam  Constitutional: He is oriented to person, place, and time. He appears well-developed and well-nourished. No distress.  HENT:  Head: Normocephalic and atraumatic.  Nose: Nose normal.  Mouth/Throat: Oropharynx is clear and moist. No oropharyngeal exudate.  Eyes: Conjunctivae and EOM are normal. Pupils are equal, round, and reactive to light. Right eye exhibits no discharge. Left eye exhibits no discharge.  No scleral icterus.  Neck: Normal range of motion. No JVD present. No tracheal deviation present. No thyromegaly present.  Cardiovascular: Normal rate, regular rhythm, normal heart sounds and intact distal pulses.  Exam reveals no gallop and no friction rub.   No murmur heard. Symmetrical radial pulses 2+, DP pulses 2+, no pretibial edema  Pulmonary/Chest: Effort normal. No respiratory distress. He has wheezes. He has no rales. He exhibits no tenderness.  Expiratory wheeze in all lung fields, no rhonchi, no rales, no tachypnea, no retractions, no chest wall tenderness, occasional cough  Abdominal: Soft. Bowel sounds are normal. He exhibits no distension and no mass. There is no tenderness. There is no rebound and no guarding.  Musculoskeletal: Normal range of motion. He exhibits no edema or tenderness.  Lymphadenopathy:    He has no cervical adenopathy.  Neurological: He is alert and oriented to person, place, and time. He has normal reflexes. No cranial nerve deficit. He exhibits normal muscle tone. Coordination normal.  Skin: Skin is warm and dry. No rash noted. He is not diaphoretic. No erythema. No pallor.  Psychiatric: He has a normal mood and  affect. His behavior is normal. Judgment and thought content normal.  Nursing note and vitals reviewed.   ED Course  Procedures (including critical care time) Labs Review Labs Reviewed  CBC - Abnormal; Notable for the following:    RBC 4.07 (*)    Hemoglobin 12.3 (*)    HCT 38.2 (*)    Platelets 126 (*)    All other components within normal limits  COMPREHENSIVE METABOLIC PANEL - Abnormal; Notable for the following:    Glucose, Bld 128 (*)    Albumin 3.4 (*)    All other components within normal limits  URINALYSIS, ROUTINE W REFLEX MICROSCOPIC (NOT AT [facility]) - Abnormal; Notable for the following:    APPearance HAZY (*)    All other components within normal limits  I-STAT CHEM 8, ED - Abnormal; Notable for the following:    Glucose, Bld 116 (*)    Calcium, Ion 1.10 (*)    All other components within normal limits  BRAIN NATRIURETIC PEPTIDE  URINE RAPID DRUG SCREEN, HOSP PERFORMED  TROPONIN I  MAGNESIUM  I-STAT TROPOININ, ED    Imaging Review Dg Chest 2 View  09/08/2015  CLINICAL DATA:  Chest pain and shortness of breath tonight. History of asthma, COPD, heart attack, hypertension. EXAM: CHEST  2 VIEW COMPARISON:  Chest radiograph June 05, 2015 FINDINGS: Cardiomediastinal silhouette is normal. The lungs are clear without pleural effusions or focal consolidations. Trachea projects midline and there is no pneumothorax. Soft tissue planes and included osseous structures are non-suspicious. Mild degenerative change of thoracic spine. IMPRESSION: No active cardiopulmonary disease. Electronically Signed   By: Elon Alas M.D.   On: 09/08/2015 04:30   I have personally reviewed and evaluated these images and lab results as part of my medical decision-making.   EKG Interpretation   Date/Time:  Wednesday September 08 2015 03:24:14 EDT Ventricular Rate:  73 PR Interval:  192 QRS Duration: 78 QT Interval:  458 QTC Calculation: 504 R Axis:   15 Text Interpretation:  Normal sinus  rhythm Prolonged QT Abnormal ECG No  significant change since last tracing Confirmed by WARD,  DO, KRISTEN  [id number]) on 09/08/2015 3:33:58 AM      MDM   High risk pt with CP, onset at rest, hx consistent with unstable angina, hx of same, recent cath in 12/2014 shows multivessel disease.  Patient had diffuse expiratory wheeze on exam, given DuoNeb in the ER, however do not suspect COPD exacerbation as sources of chest pain. Patient was given IV fluids and morphine for his pain, with mild improvement of his chest pain however he continues to have central and left-sided chest pressure, rated 5 out of 10. The patient's blood pressure was soft, do not feel would tolerate Nitropaste.  Case was discussed with Dr. Leonides Schanz, agrees with work up and plan to admit for high risk patient with continued CP, suspicious for ACS. Patient also has history of DVT and PE, is currently on eliquis.  He is not having respiratory distress, is not hypoxic, not tachycardic, do not suspect PE as most likely cause of CP, but he does have high risk for PE.  Patient also has a diagnosis of diastolic heart failure, appears euvolemic in the ER without symptoms of acute fluid overload, no orthopnea and exertional dyspnea, chest x-ray negative for pleural effusion or pulmonary edema.  Patient admitted by Dr. Fabio Neighbors tested in unit for further workup and evaluation of active chest pain.   Final diagnoses:  Chest pain, unspecified chest pain type  Unstable angina ([place])      Delsa Grana, PA-C 09/09/15 [place], DO 09/09/15 [id number]

## 2015-09-08 NOTE — ED Provider Notes (Signed)
Medical screening examination/treatment/procedure(s) were conducted as a shared visit with non-physician practitioner(s) and myself.  I personally evaluated the patient during the encounter.   EKG Interpretation   Date/Time:  Wednesday September 08 2015 03:24:14 EDT Ventricular Rate:  73 PR Interval:  192 QRS Duration: 78 QT Interval:  458 QTC Calculation: 504 R Axis:   15 Text Interpretation:  Normal sinus rhythm Prolonged QT Abnormal ECG No  significant change since last tracing Confirmed by WARD,  DO, KRISTEN  [phone]) on 09/08/2015 3:33:58 AM      Pt is a 55 y.o. male with history of COPD, PE, DVT on Eliquis, CAD, previous history of cocaine use who presents to the emergency department chest pain.  Describes it as a pressure with shortness of breath. EKG shows no new ischemic abnormality. Troponin negative. BNP normal. Has some wheezing and is improving with albuterol. Chest x-ray clear. Reports compliance with his anticoagulation. Has significant history of CAD. Will admit for ACS rule out.  [place], DO 09/08/15 [phone]

## 2015-09-08 NOTE — Progress Notes (Signed)
     The myoview from today looks very similary to the [facility] form 2016.   He has a known occlusion of his diag.  Was found to have a markedly elevate d-dimer. Getting a CT angio.     Mertie Moores, MD  09/08/2015 4:33 PM    [place] [place],  [place], [place]  [address] Pager [phone] Phone: [phone]; Fax: [phone]

## 2015-09-08 NOTE — ED Notes (Signed)
Attempted to call report

## 2015-09-08 NOTE — H&P (Signed)
History and Physical    Leelynd Romualdo [id number] DOB: Nov 03, 1960 DOA: 09/08/2015   PCP: [name], MD Chief Complaint:  Chief Complaint  Patient presents with  . Chest Pain    HPI: Huey Nathe is a 55 y.o. male with medical history significant of CAD, cocaine abuse in the past but quit he says, last heart cath was October 2016 which demonstrated two vessel disease, an occluded marginal stent, and non-occlusive disease elsewhere.  Cards recommended medical management.  Patient presents to the ED this evening with L chest "pressure" like pain.  He states it feels like his prior MI back in 2015.  No radiation.  Onset was sudden and was at about 10:30 pm last night.  Exertion makes CP worse as well as his SOB worse.  ED Course: Initially given doses of NTG which did nothing for pain and dropped his BP.  Pain was finally improved with morphine, however it returned after morphine wore off.  Review of Systems: As per HPI otherwise 10 point review of systems negative.    Past Medical History  Diagnosis Date  . Asthma   . DVT (deep venous thrombosis) ([place])     "I had ~ 10 in each leg"  . Pulmonary embolism ([place])     a. 2012 - s/p IVC filter;  b. prev on eliquis - noncompliant.  Marland Kitchen CAD (coronary artery disease)     a. 09/2013 NSTEMI/PCI: LM nl, LAD 40-50%, D1 100 (2.25 x 28 Vision BMS), LCX min irregs, RI 60-70, 30, RCA 40-50/50-71ms/p, EF 45-50%.  Marland Kitchen COPD (chronic obstructive pulmonary disease) ([place])   . History of pneumonia   . Sinus headache   . Osteoarthritis     a. hands and toes.  . Chronic diastolic CHF (congestive heart failure) ([place])     a. 09/2014 EF 45-50% by LV gram;  b. 01/2014 Echo: EF 55-60%, Gr 1 DD.  Marland Kitchen Essential hypertension   . Tobacco abuse   . Hepatitis C   . Cocaine abuse     Past Surgical History  Procedure Laterality Date  . Vena cava filter placement  " ~ 2012  . Coronary angioplasty with stent placement  10/21/13    BMS to D1  . Left heart  catheterization with coronary angiogram N/A 10/21/2013    Procedure: LEFT HEART CATHETERIZATION WITH CORONARY ANGIOGRAM;  Surgeon: Leonie Man, MD;  Location: [facility] CATH LAB;  Service: Cardiovascular;  Laterality: N/A;  . Cardiac catheterization N/A 01/04/2015    Procedure: Left Heart Cath and Coronary Angiography;  Surgeon: Burnell Blanks, MD;  Location: [place] CV LAB;  Service: Cardiovascular;  Laterality: N/A;     reports that he has been smoking Cigarettes.  He has a 9.5 pack-year smoking history. He has never used smokeless tobacco. He reports that he drinks about 3.6 oz of alcohol per week. He reports that he does not use illicit drugs.  No Known Allergies  Family History  Problem Relation Age of Onset  . Asthma Mother   . Allergies Mother   . Allergies Sister   . Allergies Brother   . Deep vein thrombosis Brother     two brothers with recurrent DVT  . Heart attack Father     a.60s b. deceased in his 102s  . Coronary artery disease Father      Prior to Admission medications   Medication Sig Start Date End Date Taking? Authorizing Provider  albuterol (PROVENTIL HFA;VENTOLIN HFA) 108 (90 BASE) MCG/ACT inhaler Inhale  2 puffs into the lungs every 6 (six) hours as needed for wheezing or shortness of breath. 10/22/14  Yes Charlynne Cousins, MD  albuterol (PROVENTIL) (2.5 MG/3ML) 0.083% nebulizer solution Take 3 mLs (2.5 mg total) by nebulization every 6 (six) hours as needed for wheezing or shortness of breath. Use 3 times daily x 3 days then as needed. 06/08/15  Yes Eugenie Filler, MD  apixaban (ELIQUIS) 2.5 MG TABS tablet Take 1 tablet (2.5 mg total) by mouth 2 (two) times daily. 01/04/15  Yes Eugenie Filler, MD  budesonide-formoterol [facility]) 160-4.5 MCG/ACT inhaler Inhale 2 puffs into the lungs 2 (two) times daily. 06/08/15  Yes Eugenie Filler, MD  fluticasone [facility]) 50 MCG/ACT nasal spray Place 2 sprays into both nostrils daily. Patient taking  differently: Place 2 sprays into both nostrils daily as needed for allergies.  06/08/15  Yes Eugenie Filler, MD  nitroGLYCERIN (NITROSTAT) 0.4 MG SL tablet Place 1 tablet (0.4 mg total) under the tongue every 5 (five) minutes as needed for chest pain (CP or SOB). 01/04/15  Yes Eugenie Filler, MD  traMADol (ULTRAM) 50 MG tablet Take 2 tablets (100 mg total) by mouth every 6 (six) hours as needed for moderate pain. 06/08/15  Yes Eugenie Filler, MD    Physical Exam: Filed Vitals:   09/08/15 0327 09/08/15 0424 09/08/15 0425  BP: 107/57 112/84   Pulse: 80  95  Temp: 97.3 F (36.3 C)    TempSrc: Oral    Resp:   14  SpO2: 98%  99%      Constitutional: NAD, calm, comfortable Eyes: PERRL, lids and conjunctivae normal ENMT: Mucous membranes are moist. Posterior pharynx clear of any exudate or lesions.Normal dentition.  Neck: normal, supple, no masses, no thyromegaly Respiratory: clear to auscultation bilaterally, no wheezing, no crackles. Normal respiratory effort. No accessory muscle use.  Cardiovascular: Regular rate and rhythm, no murmurs / rubs / gallops. No extremity edema. 2+ pedal pulses. No carotid bruits.  Abdomen: no tenderness, no masses palpated. No hepatosplenomegaly. Bowel sounds positive.  Musculoskeletal: no clubbing / cyanosis. No joint deformity upper and lower extremities. Good ROM, no contractures. Normal muscle tone.  Skin: no rashes, lesions, ulcers. No induration Neurologic: CN 2-12 grossly intact. Sensation intact, DTR normal. Strength 5/5 in all 4.  Psychiatric: Normal judgment and insight. Alert and oriented x 3. Normal mood.    Labs on Admission: I have personally reviewed following labs and imaging studies  CBC:  Recent Labs Lab 09/08/15 0334 09/08/15 0435  WBC 4.2  --   HGB 12.3* 13.9  HCT 38.2* 41.0  MCV 93.9  --   PLT 126*  --    Basic Metabolic Panel:  Recent Labs Lab 09/08/15 0334 09/08/15 0435  NA 138 141  K 4.6 4.7  CL 108 106    CO2 22  --   GLUCOSE 128* 116*  BUN 6 6  CREATININE 0.80 1.00  CALCIUM 9.0  --    GFR: CrCl cannot be calculated (Unknown ideal weight.). Liver Function Tests:  Recent Labs Lab 09/08/15 0334  AST 28  ALT 17  ALKPHOS 77  BILITOT 0.6  PROT 6.7  ALBUMIN 3.4*   No results for input(s): LIPASE, AMYLASE in the last 168 hours. No results for input(s): AMMONIA in the last 168 hours. Coagulation Profile: No results for input(s): INR, PROTIME in the last 168 hours. Cardiac Enzymes: No results for input(s): CKTOTAL, CKMB, CKMBINDEX, TROPONINI in the last 168 hours. BNP (  last 3 results) No results for input(s): PROBNP in the last 8760 hours. HbA1C: No results for input(s): HGBA1C in the last 72 hours. CBG: No results for input(s): GLUCAP in the last 168 hours. Lipid Profile: No results for input(s): CHOL, HDL, LDLCALC, TRIG, CHOLHDL, LDLDIRECT in the last 72 hours. Thyroid Function Tests: No results for input(s): TSH, T4TOTAL, FREET4, T3FREE, THYROIDAB in the last 72 hours. Anemia Panel: No results for input(s): VITAMINB12, FOLATE, FERRITIN, TIBC, IRON, RETICCTPCT in the last 72 hours. Urine analysis:    Component Value Date/Time   COLORURINE YELLOW 09/08/2015 0411   APPEARANCEUR HAZY* 09/08/2015 0411   LABSPEC 1.011 09/08/2015 0411   PHURINE 5.5 09/08/2015 0411   GLUCOSEU NEGATIVE 09/08/2015 0411   HGBUR NEGATIVE 09/08/2015 0411   BILIRUBINUR NEGATIVE 09/08/2015 0411   KETONESUR NEGATIVE 09/08/2015 0411   PROTEINUR NEGATIVE 09/08/2015 0411   UROBILINOGEN 0.2 03/28/2012 0118   NITRITE NEGATIVE 09/08/2015 0411   LEUKOCYTESUR NEGATIVE 09/08/2015 0411   Sepsis Labs: @LABRCNTIP (procalcitonin:4,lacticidven:4) )No results found for this or any previous visit (from the past 240 hour(s)).   Radiological Exams on Admission: Dg Chest 2 View  09/08/2015  CLINICAL DATA:  Chest pain and shortness of breath tonight. History of asthma, COPD, heart attack, hypertension. EXAM: CHEST   2 VIEW COMPARISON:  Chest radiograph June 05, 2015 FINDINGS: Cardiomediastinal silhouette is normal. The lungs are clear without pleural effusions or focal consolidations. Trachea projects midline and there is no pneumothorax. Soft tissue planes and included osseous structures are non-suspicious. Mild degenerative change of thoracic spine. IMPRESSION: No active cardiopulmonary disease. Electronically Signed   By: Elon Alas M.D.   On: 09/08/2015 04:30    EKG: Independently reviewed.  Assessment/Plan Principal Problem:   Unstable angina (HCC)   UA - HEART score of 5  Chest pain obs pathway  Serial trops  Tele monitor  Patient already on eliquis, so no heparin gtt  Morphine prn pain  NPO  Likely warrants cards consult later this morning given his history of known CAD on cath just last October.  Stress test at that time just prior to the heart cath was at least "intermediate-risk" with evidence of inducible ischemia to anterior wall.   DVT prophylaxis: Continue Eliquis Code Status: Full Family Communication: No family in room Consults called: None Admission status: Admit to obs   Janica Eldred, [place] [facility] Pager [phone] from 7PM-7AM  If 7AM-7PM, please contact the day physician for the patient [email or web address] Password [facility]  09/08/2015, 5:43 AM

## 2015-09-09 DIAGNOSIS — I200 Unstable angina: Secondary | ICD-10-CM | POA: Diagnosis not present

## 2015-09-09 DIAGNOSIS — F1410 Cocaine abuse, uncomplicated: Secondary | ICD-10-CM | POA: Diagnosis not present

## 2015-09-09 DIAGNOSIS — R079 Chest pain, unspecified: Secondary | ICD-10-CM | POA: Diagnosis not present

## 2015-09-09 DIAGNOSIS — J449 Chronic obstructive pulmonary disease, unspecified: Secondary | ICD-10-CM | POA: Diagnosis not present

## 2015-09-09 LAB — BASIC METABOLIC PANEL
Anion gap: 6 (ref 5–15)
BUN: 7 mg/dL (ref 6–20)
CHLORIDE: 108 mmol/L (ref 101–111)
CO2: 24 mmol/L (ref 22–32)
CREATININE: 0.7 mg/dL (ref 0.61–1.24)
Calcium: 8.9 mg/dL (ref 8.9–10.3)
GFR calc Af Amer: >60 mL/min (ref >60)
GFR calc non Af Amer: >60 mL/min (ref >60)
GLUCOSE: 105 mg/dL — AB (ref 65–99)
Potassium: 4.1 mmol/L (ref 3.5–5.1)
SODIUM: 138 mmol/L (ref 135–145)

## 2015-09-09 LAB — CBC
HCT: 35.3 % — ABNORMAL LOW (ref 39.0–52.0)
HEMOGLOBIN: 11.4 g/dL — AB (ref 13.0–17.0)
MCH: 30.6 pg (ref 26.0–34.0)
MCHC: 32.3 g/dL (ref 30.0–36.0)
MCV: 94.9 fL (ref 78.0–100.0)
Platelets: 117 10*3/uL — ABNORMAL LOW (ref 150–400)
RBC: 3.72 MIL/uL — ABNORMAL LOW (ref 4.22–5.81)
RDW: 14.5 % (ref 11.5–15.5)
WBC: 3.5 10*3/uL — ABNORMAL LOW (ref 4.0–10.5)

## 2015-09-09 MED ORDER — APIXABAN 5 MG PO TABS: 5.0000 mg | ORAL_TABLET | Freq: Two times a day (BID) | ORAL | Status: DC

## 2015-09-09 MED ORDER — APIXABAN 5 MG PO TABS
5.0000 mg | ORAL_TABLET | Freq: Two times a day (BID) | ORAL | Status: DC
  Administered 2015-09-09: 5 mg via ORAL
  Filled 2015-09-09: qty 1

## 2015-09-09 MED ORDER — APIXABAN 2.5 MG PO TABS: 2.5000 mg | ORAL_TABLET | Freq: Two times a day (BID) | ORAL | Status: DC

## 2015-09-09 NOTE — Progress Notes (Signed)
PROGRESS NOTE  Subjective:    55 yo man , known CAD ( occluded diag)  Hx of PE  Was on low dose Eliquis   Objective:    Vital Signs:   Temp:  [98 F (36.7 C)-98.1 F (36.7 C)] 98.1 F (36.7 C) (06/15 0557) Pulse Rate:  [95] 95 (06/14 1431) Resp:  [16-18] 16 (06/15 0557) BP: (135-149)/(72-100) 147/90 mmHg (06/15 0557) SpO2:  [98 %-100 %] 98 % (06/15 0839) Weight:  [172 lb 12.8 oz (78.382 kg)] 172 lb 12.8 oz (78.382 kg) (06/15 0557)  Last BM Date: 09/08/15   24-hour weight change: Weight change: -9.6 oz (-0.272 kg)  Weight trends: Filed Weights   09/08/15 0658 09/09/15 0557  Weight: 173 lb 6.4 oz (78.654 kg) 172 lb 12.8 oz (78.382 kg)    Intake/Output:  06/14 0701 - 06/15 0700 In: 820 [P.O.:820] Out: 1300 [Urine:1300] Total I/O In: 240 [P.O.:240] Out: 275 [Urine:275]   Physical Exam: BP 147/90 mmHg  Pulse 95  Temp(Src) 98.1 F (36.7 C) (Oral)  Resp 16  Ht 6\' 2"  (1.88 m)  Wt 172 lb 12.8 oz (78.382 kg)  BMI 22.18 kg/m2  SpO2 98%  Wt Readings from Last 3 Encounters:  09/09/15 172 lb 12.8 oz (78.382 kg)  06/04/15 160 lb 9.6 oz (72.848 kg)  04/08/15 171 lb (77.565 kg)    General: Vital signs reviewed and noted.   Head: Normocephalic, atraumatic.  Eyes: conjunctivae/corneas clear.  EOM's intact.   Throat: normal  Neck:  normal   Lungs:    clear  Heart:  RR   Abdomen:  Soft, non-tender, non-distended    Extremities: No c/c/e    Neurologic: A&O X3, CN II - XII are grossly intact.   Psych: Normal     Labs: BMET:  Recent Labs  09/08/15 0334 09/08/15 0435 09/09/15 0409  NA 138 141 138  K 4.6 4.7 4.1  CL 108 106 108  CO2 22  --  24  GLUCOSE 128* 116* 105*  BUN 6 6 7   CREATININE 0.80 1.00 0.70  CALCIUM 9.0  --  8.9  MG 2.1  --   --     Liver function tests:  Recent Labs  09/08/15 0334  AST 28  ALT 17  ALKPHOS 77  BILITOT 0.6  PROT 6.7  ALBUMIN 3.4*   No results for input(s): LIPASE, AMYLASE in the last 72  hours.  CBC:  Recent Labs  09/08/15 0334 09/08/15 0435 09/09/15 0409  WBC 4.2  --  3.5*  HGB 12.3* 13.9 11.4*  HCT 38.2* 41.0 35.3*  MCV 93.9  --  94.9  PLT 126*  --  117*    Cardiac Enzymes:  Recent Labs  09/08/15 0334 09/08/15 0738 09/08/15 1257  TROPONINI <0.03 <0.03 <0.03    Coagulation Studies: No results for input(s): LABPROT, INR in the last 72 hours.  Other: Invalid input(s): POCBNP  Recent Labs  09/08/15 1257  DDIMER 10.26*   No results for input(s): HGBA1C in the last 72 hours. No results for input(s): CHOL, HDL, LDLCALC, TRIG, CHOLHDL in the last 72 hours. No results for input(s): TSH, T4TOTAL, T3FREE, THYROIDAB in the last 72 hours.  Invalid input(s): FREET3 No results for input(s): VITAMINB12, FOLATE, FERRITIN, TIBC, IRON, RETICCTPCT in the last 72 hours.   Other results:  EKG  ( personally reviewed )  NSR ,    Medications:    Infusions:    Scheduled Medications: . apixaban  5 mg  Oral BID  . mometasone-formoterol  2 puff Inhalation BID    Assessment/ Plan:   Principal Problem:   Unstable angina ([place])  1. CAD:   His Myoview showed a small anterior defect that is consistent with his known occlusion of his diagonal artery. He had a cardiac catheterization in October and it was decided to proceed with medical therapy.  Continue medical therapy.   2. History of pulmonary embolus. He's had a history of pulmonary embolus. I think that he should be on the higher dose of Eliquis 5 mg twice a day.  He has been followed by his primary medical doctor.  He may be discharged to home today. Disposition:  Length of Stay:   Ramond Dial., MD, [facility] 09/09/2015, 9:51 AM Office [phone] Pager [phone]

## 2015-09-09 NOTE — Discharge Summary (Addendum)
Physician Discharge Summary  [name] [id number] DOB: 07-11-1960 DOA: 09/08/2015  PCP: [name], MD   Admit date: 09/08/2015 Discharge date: 09/09/2015  Recommendations for Outpatient Follow-up:  1. Apixaban 5 mg BID on discharge, encouraged compliance   Discharge Diagnoses:  Principal Problem:   Unstable angina [facility])    Discharge Condition: stable   Diet recommendation: as tolerated   History of present illness:   55 y.o. male with past medical history significant of CAD, cocaine abuse, last heart cath was October 2016 which demonstrated two vessel disease, an occluded marginal stent, and non-occlusive disease elsewhere. Cardiology recommended medical management.  Patient presented to the ED with left sided chest pain, reported it felt like prior MI in 2015. Pain was sudden, at rest, 8/10 in intensity, non radiation, worse with exertion. Pain was associated with shortness of breath. Pain improved with morphine.    Hospital Course:   Left sided chest pain - Troponin level was WNL x 3 - Stress test intermediate risk - Cardiology following - So far recommendation to continue current meds including apixaban 5 mg BID  COPD - Stable respiratory status - Oxygen saturation above 95% on room air - Continue Symbicort  Cocaine abuse - Counseled on cessation    Signed:  Leisa Lenz, MD  [facility] 09/09/2015, 9:09 AM  Pager #: [phone]  Time spent in minutes: less than 30 minutes  Procedures:  Stress test 6/14 - There was no ST segment deviation noted during stress. No T wave inversion was noted during stress. Defect 1: There is a small defect of moderate severity in the LAD territory. Findings consistent with ischemia. The left ventricular ejection fraction is normal (55-65%). This is an intermediate risk study.  Consultations:  Cardiology   Discharge Exam: Filed Vitals:   09/08/15 2044 09/09/15 0557  BP: 142/84 147/90  Pulse:     Temp: 98 F (36.7 C) 98.1 F (36.7 C)  Resp: 16 16   Filed Vitals:   09/08/15 2044 09/08/15 2104 09/09/15 0557 09/09/15 0839  BP: 142/84  147/90   Pulse:      Temp: 98 F (36.7 C)  98.1 F (36.7 C)   TempSrc: Oral  Oral   Resp: 16  16   Height:      Weight:   78.382 kg (172 lb 12.8 oz)   SpO2: 100% 100% 99% 98%    General: Pt is alert, follows commands appropriately, not in acute distress Cardiovascular: Regular rate and rhythm, S1/S2 + Respiratory: no wheezing, no crackles, no rhonchi Abdominal: Soft, non tender, non distended, bowel sounds +, no guarding Extremities: no edema, no cyanosis, pulses palpable bilaterally DP and PT Neuro: Grossly nonfocal  Discharge Instructions  Discharge Instructions    Call MD for:  difficulty breathing, headache or visual disturbances    Complete by:  As directed      Call MD for:  persistant dizziness or light-headedness    Complete by:  As directed      Call MD for:  persistant nausea and vomiting    Complete by:  As directed      Call MD for:  severe uncontrolled pain    Complete by:  As directed      Diet - low sodium heart healthy    Complete by:  As directed      Discharge instructions    Complete by:  As directed   Continue apixaban 5 mg twice a day as prescribed.     Increase  activity slowly    Complete by:  As directed             Medication List    TAKE these medications        albuterol 108 (90 Base) MCG/ACT inhaler  Commonly known as:  PROVENTIL HFA;VENTOLIN HFA  Inhale 2 puffs into the lungs every 6 (six) hours as needed for wheezing or shortness of breath.     apixaban 5 MG Tabs tablet  Commonly known as:  ELIQUIS  Take 1 tablet (5 mg total) by mouth 2 (two) times daily.     budesonide-formoterol 160-4.5 MCG/ACT inhaler  Commonly known as:  SYMBICORT  Inhale 2 puffs into the lungs 2 (two) times daily.     fluticasone 50 MCG/ACT nasal spray  Commonly known as:  FLONASE  Place 2 sprays into both  nostrils daily as needed for allergies.     nitroGLYCERIN 0.4 MG SL tablet  Commonly known as:  NITROSTAT  Place 1 tablet (0.4 mg total) under the tongue every 5 (five) minutes as needed for chest pain (CP or SOB).     traMADol 50 MG tablet  Commonly known as:  ULTRAM  Take 2 tablets (100 mg total) by mouth every 6 (six) hours as needed for moderate pain.            Follow-up Information    Follow up with [name], MD. Schedule an appointment as soon as possible for a visit in 1 week.   Specialty:  Family Medicine   Why:  Follow up appt after recent hospitalization   Contact information:   [place] [id number] [phone]        The results of significant diagnostics from this hospitalization (including imaging, microbiology, ancillary and laboratory) are listed below for reference.    Significant Diagnostic Studies: Dg Chest 2 View  09/08/2015  CLINICAL DATA:  Chest pain and shortness of breath tonight. History of asthma, COPD, heart attack, hypertension. EXAM: CHEST  2 VIEW COMPARISON:  Chest radiograph June 05, 2015 FINDINGS: Cardiomediastinal silhouette is normal. The lungs are clear without pleural effusions or focal consolidations. Trachea projects midline and there is no pneumothorax. Soft tissue planes and included osseous structures are non-suspicious. Mild degenerative change of thoracic spine. IMPRESSION: No active cardiopulmonary disease. Electronically Signed   By: Elon Alas M.D.   On: 09/08/2015 04:30   Ct Angio Chest Pe W/cm &/or Wo Cm  09/08/2015  CLINICAL DATA:  Elevated D-dimer. EXAM: CT ANGIOGRAPHY CHEST WITH CONTRAST TECHNIQUE: Multidetector CT imaging of the chest was performed using the standard protocol during bolus administration of intravenous contrast. Multiplanar CT image reconstructions and MIPs were obtained to evaluate the vascular anatomy. CONTRAST:  100 mL Isovue 370 COMPARISON:  12/31/2014 FINDINGS: Technically  adequate study with good opacification of the central and segmental pulmonary arteries. No focal filling defects demonstrated. No evidence of significant pulmonary embolus. Normal heart size. Normal caliber thoracic aorta. Coronary artery calcifications. Mediastinal lymph nodes are not pathologically enlarged. Esophagus is mostly decompressed. Evaluation of lungs is somewhat limited due to motion artifact. There is evidence of bronchial wall thickening with some mucous plugging or secretions in the upper lobe bronchi. Mild tree-in-bud pattern in the lung bases. Changes may represent bronchiolitis or other inflammatory process. No focal consolidation. No pneumothorax. No pleural effusions. In the Included portions of the upper abdominal organs are grossly unremarkable. Degenerative changes in the spine. Review of the MIP images confirms the above findings.  IMPRESSION: No evidence of significant pulmonary embolus. Bronchial wall thickening with mucous plugging and tree-in-bud infiltrative pattern suggesting small airways inflammatory disease or bronchopneumonia. Electronically Signed   By: Lucienne Capers M.D.   On: 09/08/2015 22:34   Nm Myocar Multi W/spect W/wall Motion / Ef  09/08/2015   There was no ST segment deviation noted during stress.  No T wave inversion was noted during stress.  Defect 1: There is a small defect of moderate severity in the LAD territory.  Findings consistent with ischemia.  The left ventricular ejection fraction is normal (55-65%).  This is an intermediate risk study.     Microbiology: Recent Results (from the past 240 hour(s))  MRSA PCR Screening     Status: None   Collection Time: 09/08/15  7:00 AM  Result Value Ref Range Status   MRSA by PCR NEGATIVE NEGATIVE Final    Comment:        The GeneXpert MRSA Assay (FDA approved for NASAL specimens only), is one component of a comprehensive MRSA colonization surveillance program. It is not intended to diagnose  MRSA infection nor to guide or monitor treatment for MRSA infections.      Labs: Basic Metabolic Panel:  Recent Labs Lab 09/08/15 0334 09/08/15 0435 09/09/15 0409  NA 138 141 138  K 4.6 4.7 4.1  CL 108 106 108  CO2 22  --  24  GLUCOSE 128* 116* 105*  BUN 6 6 7   CREATININE 0.80 1.00 0.70  CALCIUM 9.0  --  8.9  MG 2.1  --   --    Liver Function Tests:  Recent Labs Lab 09/08/15 0334  AST 28  ALT 17  ALKPHOS 77  BILITOT 0.6  PROT 6.7  ALBUMIN 3.4*   No results for input(s): LIPASE, AMYLASE in the last 168 hours. No results for input(s): AMMONIA in the last 168 hours. CBC:  Recent Labs Lab 09/08/15 0334 09/08/15 0435 09/09/15 0409  WBC 4.2  --  3.5*  HGB 12.3* 13.9 11.4*  HCT 38.2* 41.0 35.3*  MCV 93.9  --  94.9  PLT 126*  --  117*   Cardiac Enzymes:  Recent Labs Lab 09/08/15 0334 09/08/15 0738 09/08/15 1257  TROPONINI <0.03 <0.03 <0.03   BNP: BNP (last 3 results)  Recent Labs  01/01/15 0528 06/03/15 1045 09/08/15 0334  BNP 87.1 11.3 57.2    ProBNP (last 3 results) No results for input(s): PROBNP in the last 8760 hours.  CBG: No results for input(s): GLUCAP in the last 168 hours.

## 2015-09-09 NOTE — Discharge Instructions (Signed)

## 2015-09-13 ENCOUNTER — Ambulatory Visit: Payer: Medicare Other | Admitting: Internal Medicine

## 2015-10-13 MED FILL — traMADol HCL 50 MG TABS: 50 | 3 days supply | Qty: 10 | Fill #0

## 2015-10-13 MED FILL — ELIQUIS 5 MG TABLET: 5 | 30 days supply | Qty: 60 | Fill #0

## 2015-11-05 ENCOUNTER — Encounter (HOSPITAL_COMMUNITY): Payer: Self-pay | Admitting: Emergency Medicine

## 2015-11-05 ENCOUNTER — Inpatient Hospital Stay (HOSPITAL_COMMUNITY)
Admission: EM | Admit: 2015-11-05 | Discharge: 2015-11-10 | DRG: 190 | Disposition: A | Discharge status: Home / Self Care | Payer: Medicare Other | Attending: Internal Medicine | Admitting: Internal Medicine

## 2015-11-05 DIAGNOSIS — R05 Cough: Secondary | ICD-10-CM | POA: Diagnosis not present

## 2015-11-05 DIAGNOSIS — B1920 Unspecified viral hepatitis C without hepatic coma: Secondary | ICD-10-CM | POA: Diagnosis present

## 2015-11-05 DIAGNOSIS — I82403 Acute embolism and thrombosis of unspecified deep veins of lower extremity, bilateral: Secondary | ICD-10-CM | POA: Diagnosis present

## 2015-11-05 DIAGNOSIS — M79661 Pain in right lower leg: Secondary | ICD-10-CM

## 2015-11-05 DIAGNOSIS — Z9114 Patient's other noncompliance with medication regimen: Secondary | ICD-10-CM

## 2015-11-05 DIAGNOSIS — F17210 Nicotine dependence, cigarettes, uncomplicated: Secondary | ICD-10-CM | POA: Diagnosis not present

## 2015-11-05 DIAGNOSIS — T17890A Other foreign object in other parts of respiratory tract causing asphyxiation, initial encounter: Secondary | ICD-10-CM | POA: Diagnosis not present

## 2015-11-05 DIAGNOSIS — J9621 Acute and chronic respiratory failure with hypoxia: Secondary | ICD-10-CM | POA: Diagnosis not present

## 2015-11-05 DIAGNOSIS — D61818 Other pancytopenia: Secondary | ICD-10-CM | POA: Diagnosis not present

## 2015-11-05 DIAGNOSIS — R0602 Shortness of breath: Secondary | ICD-10-CM | POA: Diagnosis not present

## 2015-11-05 DIAGNOSIS — I10 Essential (primary) hypertension: Secondary | ICD-10-CM | POA: Diagnosis present

## 2015-11-05 DIAGNOSIS — R069 Unspecified abnormalities of breathing: Secondary | ICD-10-CM | POA: Diagnosis not present

## 2015-11-05 DIAGNOSIS — B349 Viral infection, unspecified: Secondary | ICD-10-CM | POA: Diagnosis present

## 2015-11-05 DIAGNOSIS — I5032 Chronic diastolic (congestive) heart failure: Secondary | ICD-10-CM | POA: Diagnosis present

## 2015-11-05 DIAGNOSIS — J45901 Unspecified asthma with (acute) exacerbation: Secondary | ICD-10-CM | POA: Diagnosis present

## 2015-11-05 DIAGNOSIS — Z720 Tobacco use: Secondary | ICD-10-CM | POA: Diagnosis present

## 2015-11-05 DIAGNOSIS — M1990 Unspecified osteoarthritis, unspecified site: Secondary | ICD-10-CM | POA: Diagnosis present

## 2015-11-05 DIAGNOSIS — Z86711 Personal history of pulmonary embolism: Secondary | ICD-10-CM | POA: Diagnosis present

## 2015-11-05 DIAGNOSIS — I2510 Atherosclerotic heart disease of native coronary artery without angina pectoris: Secondary | ICD-10-CM | POA: Diagnosis present

## 2015-11-05 DIAGNOSIS — J441 Chronic obstructive pulmonary disease with (acute) exacerbation: Secondary | ICD-10-CM | POA: Diagnosis not present

## 2015-11-05 DIAGNOSIS — I2699 Other pulmonary embolism without acute cor pulmonale: Secondary | ICD-10-CM | POA: Diagnosis present

## 2015-11-05 DIAGNOSIS — I110 Hypertensive heart disease with heart failure: Secondary | ICD-10-CM | POA: Diagnosis present

## 2015-11-05 DIAGNOSIS — Z79899 Other long term (current) drug therapy: Secondary | ICD-10-CM

## 2015-11-05 DIAGNOSIS — Z955 Presence of coronary angioplasty implant and graft: Secondary | ICD-10-CM

## 2015-11-05 DIAGNOSIS — M79662 Pain in left lower leg: Secondary | ICD-10-CM

## 2015-11-05 DIAGNOSIS — Z7901 Long term (current) use of anticoagulants: Secondary | ICD-10-CM

## 2015-11-05 DIAGNOSIS — Z86718 Personal history of other venous thrombosis and embolism: Secondary | ICD-10-CM

## 2015-11-05 MED ORDER — METHYLPREDNISOLONE SODIUM SUCC 125 MG IJ SOLR
125.0000 mg | Freq: Once | INTRAMUSCULAR | Status: AC
  Administered 2015-11-05: 125 mg via INTRAVENOUS
  Filled 2015-11-05: qty 2

## 2015-11-05 NOTE — ED Triage Notes (Signed)
Per EMS patient with Hx of asthma, wheezing and coughing x1 week.  Patient saw PCP and was prescribed albuterol inhaler which was completely used x1 weel.  Per EMS patient received 5mg  albuterol en route and currently running duoneb.

## 2015-11-06 ENCOUNTER — Emergency Department (HOSPITAL_COMMUNITY): Payer: Medicare Other

## 2015-11-06 ENCOUNTER — Inpatient Hospital Stay (HOSPITAL_COMMUNITY): Payer: Medicare Other

## 2015-11-06 DIAGNOSIS — Z720 Tobacco use: Secondary | ICD-10-CM | POA: Diagnosis not present

## 2015-11-06 DIAGNOSIS — R05 Cough: Secondary | ICD-10-CM | POA: Diagnosis not present

## 2015-11-06 DIAGNOSIS — Z86718 Personal history of other venous thrombosis and embolism: Secondary | ICD-10-CM | POA: Diagnosis not present

## 2015-11-06 DIAGNOSIS — J9621 Acute and chronic respiratory failure with hypoxia: Secondary | ICD-10-CM | POA: Diagnosis not present

## 2015-11-06 DIAGNOSIS — Z86711 Personal history of pulmonary embolism: Secondary | ICD-10-CM | POA: Diagnosis not present

## 2015-11-06 DIAGNOSIS — B1920 Unspecified viral hepatitis C without hepatic coma: Secondary | ICD-10-CM | POA: Diagnosis present

## 2015-11-06 DIAGNOSIS — M79662 Pain in left lower leg: Secondary | ICD-10-CM

## 2015-11-06 DIAGNOSIS — I82403 Acute embolism and thrombosis of unspecified deep veins of lower extremity, bilateral: Secondary | ICD-10-CM | POA: Diagnosis not present

## 2015-11-06 DIAGNOSIS — J441 Chronic obstructive pulmonary disease with (acute) exacerbation: Secondary | ICD-10-CM | POA: Diagnosis not present

## 2015-11-06 DIAGNOSIS — I2510 Atherosclerotic heart disease of native coronary artery without angina pectoris: Secondary | ICD-10-CM | POA: Diagnosis present

## 2015-11-06 DIAGNOSIS — D696 Thrombocytopenia, unspecified: Secondary | ICD-10-CM | POA: Diagnosis not present

## 2015-11-06 DIAGNOSIS — B349 Viral infection, unspecified: Secondary | ICD-10-CM | POA: Diagnosis present

## 2015-11-06 DIAGNOSIS — Z955 Presence of coronary angioplasty implant and graft: Secondary | ICD-10-CM | POA: Diagnosis not present

## 2015-11-06 DIAGNOSIS — D61818 Other pancytopenia: Secondary | ICD-10-CM | POA: Diagnosis present

## 2015-11-06 DIAGNOSIS — J45901 Unspecified asthma with (acute) exacerbation: Secondary | ICD-10-CM | POA: Diagnosis not present

## 2015-11-06 DIAGNOSIS — Z7901 Long term (current) use of anticoagulants: Secondary | ICD-10-CM | POA: Diagnosis not present

## 2015-11-06 DIAGNOSIS — I5032 Chronic diastolic (congestive) heart failure: Secondary | ICD-10-CM

## 2015-11-06 DIAGNOSIS — R0602 Shortness of breath: Secondary | ICD-10-CM | POA: Diagnosis not present

## 2015-11-06 DIAGNOSIS — M1990 Unspecified osteoarthritis, unspecified site: Secondary | ICD-10-CM | POA: Diagnosis present

## 2015-11-06 DIAGNOSIS — F17210 Nicotine dependence, cigarettes, uncomplicated: Secondary | ICD-10-CM | POA: Diagnosis present

## 2015-11-06 DIAGNOSIS — Z9114 Patient's other noncompliance with medication regimen: Secondary | ICD-10-CM | POA: Diagnosis not present

## 2015-11-06 DIAGNOSIS — M79661 Pain in right lower leg: Secondary | ICD-10-CM | POA: Diagnosis not present

## 2015-11-06 DIAGNOSIS — I110 Hypertensive heart disease with heart failure: Secondary | ICD-10-CM | POA: Diagnosis present

## 2015-11-06 DIAGNOSIS — Z79899 Other long term (current) drug therapy: Secondary | ICD-10-CM | POA: Diagnosis not present

## 2015-11-06 DIAGNOSIS — T17890A Other foreign object in other parts of respiratory tract causing asphyxiation, initial encounter: Secondary | ICD-10-CM | POA: Diagnosis present

## 2015-11-06 LAB — CBC WITH DIFFERENTIAL/PLATELET
BASOS ABS: 0 10*3/uL (ref 0.0–0.1)
BASOS PCT: 1 %
EOS ABS: 0.7 10*3/uL (ref 0.0–0.7)
EOS PCT: 15 %
HCT: 41.5 % (ref 39.0–52.0)
Hemoglobin: 13.9 g/dL (ref 13.0–17.0)
LYMPHS PCT: 30 %
Lymphs Abs: 1.4 10*3/uL (ref 0.7–4.0)
MCH: 31.3 pg (ref 26.0–34.0)
MCHC: 33.5 g/dL (ref 30.0–36.0)
MCV: 93.5 fL (ref 78.0–100.0)
Monocytes Absolute: 0.6 10*3/uL (ref 0.1–1.0)
Monocytes Relative: 12 %
Neutro Abs: 2.1 10*3/uL (ref 1.7–7.7)
Neutrophils Relative %: 44 %
PLATELETS: 131 10*3/uL — AB (ref 150–400)
RBC: 4.44 MIL/uL (ref 4.22–5.81)
RDW: 14.7 % (ref 11.5–15.5)
WBC: 4.8 10*3/uL (ref 4.0–10.5)

## 2015-11-06 LAB — BASIC METABOLIC PANEL
ANION GAP: 7 (ref 5–15)
BUN: 12 mg/dL (ref 6–20)
CO2: 23 mmol/L (ref 22–32)
Calcium: 9.2 mg/dL (ref 8.9–10.3)
Chloride: 109 mmol/L (ref 101–111)
Creatinine, Ser: 0.93 mg/dL (ref 0.61–1.24)
GFR calc Af Amer: >60 mL/min (ref >60)
Glucose, Bld: 100 mg/dL — ABNORMAL HIGH (ref 65–99)
POTASSIUM: 4.1 mmol/L (ref 3.5–5.1)
SODIUM: 139 mmol/L (ref 135–145)

## 2015-11-06 LAB — RESPIRATORY PANEL BY PCR
ADENOVIRUS-RVPPCR: NOT DETECTED
BORDETELLA PERTUSSIS-RVPCR: NOT DETECTED
CHLAMYDOPHILA PNEUMONIAE-RVPPCR: NOT DETECTED
CORONAVIRUS 229E-RVPPCR: NOT DETECTED
CORONAVIRUS HKU1-RVPPCR: NOT DETECTED
Coronavirus NL63: NOT DETECTED
Coronavirus OC43: NOT DETECTED
INFLUENZA A H1 2009-RVPPR: NOT DETECTED
INFLUENZA A H3-RVPPCR: NOT DETECTED
INFLUENZA B-RVPPCR: NOT DETECTED
Influenza A H1: NOT DETECTED
Influenza A: NOT DETECTED
Metapneumovirus: NOT DETECTED
Mycoplasma pneumoniae: NOT DETECTED
PARAINFLUENZA VIRUS 3-RVPPCR: NOT DETECTED
Parainfluenza Virus 1: NOT DETECTED
Parainfluenza Virus 2: NOT DETECTED
Parainfluenza Virus 4: NOT DETECTED
RHINOVIRUS / ENTEROVIRUS - RVPPCR: NOT DETECTED
Respiratory Syncytial Virus: NOT DETECTED

## 2015-11-06 LAB — BLOOD GAS, ARTERIAL
Acid-base deficit: 1.3 mmol/L (ref 0.0–2.0)
BICARBONATE: 22.4 meq/L (ref 20.0–24.0)
DRAWN BY: 308601
FIO2: 0.21
O2 SAT: 91.5 %
PH ART: 7.409 (ref 7.350–7.450)
PO2 ART: 66.5 mmHg — AB (ref 80.0–100.0)
Patient temperature: 37
TCO2: 20 mmol/L (ref 0–100)
pCO2 arterial: 36.1 mmHg (ref 35.0–45.0)

## 2015-11-06 LAB — RAPID URINE DRUG SCREEN, HOSP PERFORMED
AMPHETAMINES: NOT DETECTED
BARBITURATES: NOT DETECTED
BENZODIAZEPINES: NOT DETECTED
Cocaine: NOT DETECTED
Opiates: POSITIVE — AB
Tetrahydrocannabinol: NOT DETECTED

## 2015-11-06 LAB — D-DIMER, QUANTITATIVE (NOT AT ARMC): D DIMER QUANT: 3.34 ug{FEU}/mL — AB (ref 0.00–0.50)

## 2015-11-06 LAB — TROPONIN I: Troponin I: <0.03 ng/mL (ref <0.03)

## 2015-11-06 LAB — APTT
APTT: 29 s (ref 24–36)
APTT: 62 s — AB (ref 24–36)

## 2015-11-06 LAB — HEPARIN LEVEL (UNFRACTIONATED): Heparin Unfractionated: >2.2 IU/mL — ABNORMAL HIGH (ref 0.30–0.70)

## 2015-11-06 LAB — HIV ANTIBODY (ROUTINE TESTING W REFLEX): HIV Screen 4th Generation wRfx: NONREACTIVE

## 2015-11-06 LAB — STREP PNEUMONIAE URINARY ANTIGEN: Strep Pneumo Urinary Antigen: NEGATIVE

## 2015-11-06 LAB — MRSA PCR SCREENING: MRSA by PCR: NEGATIVE

## 2015-11-06 LAB — BRAIN NATRIURETIC PEPTIDE: B Natriuretic Peptide: 30.7 pg/mL (ref 0.0–100.0)

## 2015-11-06 MED ORDER — DOXYCYCLINE HYCLATE 100 MG PO TABS
100.0000 mg | ORAL_TABLET | Freq: Two times a day (BID) | ORAL | Status: DC
Start: 2015-11-06 — End: 2015-11-10
  Administered 2015-11-06 – 2015-11-10 (×10): 100 mg via ORAL
  Filled 2015-11-06 (×10): qty 1

## 2015-11-06 MED ORDER — HYDRALAZINE HCL 20 MG/ML IJ SOLN
10.0000 mg | Freq: Once | INTRAMUSCULAR | Status: AC
  Administered 2015-11-06: 10 mg via INTRAVENOUS
  Filled 2015-11-06: qty 1

## 2015-11-06 MED ORDER — ALBUTEROL (5 MG/ML) CONTINUOUS INHALATION SOLN
10.0000 mg/h | INHALATION_SOLUTION | Freq: Once | RESPIRATORY_TRACT | Status: AC
  Administered 2015-11-06: 10 mg/h via RESPIRATORY_TRACT
  Filled 2015-11-06: qty 20

## 2015-11-06 MED ORDER — HYDRALAZINE HCL 20 MG/ML IJ SOLN: 10.0000 mg | INTRAMUSCULAR | Status: DC | PRN

## 2015-11-06 MED ORDER — BENZONATATE 100 MG PO CAPS
200.0000 mg | ORAL_CAPSULE | Freq: Once | ORAL | Status: AC
  Administered 2015-11-06: 200 mg via ORAL
  Filled 2015-11-06: qty 2

## 2015-11-06 MED ORDER — SODIUM CHLORIDE 0.9 % IV SOLN
INTRAVENOUS | Status: DC
  Administered 2015-11-06: 05:00:00 via INTRAVENOUS

## 2015-11-06 MED ORDER — OXYCODONE-ACETAMINOPHEN 5-325 MG PO TABS
2.0000 | ORAL_TABLET | Freq: Four times a day (QID) | ORAL | Status: DC | PRN
  Administered 2015-11-06 – 2015-11-10 (×11): 2 via ORAL
  Filled 2015-11-06 (×12): qty 2

## 2015-11-06 MED ORDER — CETYLPYRIDINIUM CHLORIDE 0.05 % MT LIQD: 7.0000 mL | Freq: Two times a day (BID) | OROMUCOSAL | Status: DC

## 2015-11-06 MED ORDER — METHYLPREDNISOLONE SODIUM SUCC 125 MG IJ SOLR
60.0000 mg | Freq: Three times a day (TID) | INTRAMUSCULAR | Status: DC
  Administered 2015-11-06 – 2015-11-09 (×10): 60 mg via INTRAVENOUS
  Filled 2015-11-06 (×10): qty 2

## 2015-11-06 MED ORDER — AMLODIPINE BESYLATE 5 MG PO TABS
5.0000 mg | ORAL_TABLET | Freq: Every day | ORAL | Status: DC
  Administered 2015-11-06 – 2015-11-10 (×6): 5 mg via ORAL
  Filled 2015-11-06 (×6): qty 1

## 2015-11-06 MED ORDER — CHLORHEXIDINE GLUCONATE 0.12 % MT SOLN
15.0000 mL | Freq: Two times a day (BID) | OROMUCOSAL | Status: DC
  Administered 2015-11-06 – 2015-11-10 (×5): 15 mL via OROMUCOSAL
  Filled 2015-11-06 (×6): qty 15

## 2015-11-06 MED ORDER — ALBUTEROL (5 MG/ML) CONTINUOUS INHALATION SOLN: 10.0000 mg/h | INHALATION_SOLUTION | Freq: Once | RESPIRATORY_TRACT | Status: DC

## 2015-11-06 MED ORDER — APIXABAN 5 MG PO TABS
5.0000 mg | ORAL_TABLET | Freq: Two times a day (BID) | ORAL | Status: DC
  Administered 2015-11-06 (×2): 5 mg via ORAL
  Filled 2015-11-06 (×2): qty 1

## 2015-11-06 MED ORDER — METHYLPREDNISOLONE SODIUM SUCC 125 MG IJ SOLR: 125.0000 mg | Freq: Once | INTRAMUSCULAR | Status: DC

## 2015-11-06 MED ORDER — NICOTINE 21 MG/24HR TD PT24
21.0000 mg | MEDICATED_PATCH | Freq: Every day | TRANSDERMAL | Status: DC
  Filled 2015-11-06 (×4): qty 1

## 2015-11-06 MED ORDER — NITROGLYCERIN 0.4 MG SL SUBL: 0.4000 mg | SUBLINGUAL_TABLET | SUBLINGUAL | Status: DC | PRN

## 2015-11-06 MED ORDER — IPRATROPIUM-ALBUTEROL 0.5-2.5 (3) MG/3ML IN SOLN
3.0000 mL | RESPIRATORY_TRACT | Status: DC
  Administered 2015-11-06: 3 mL via RESPIRATORY_TRACT
  Filled 2015-11-06: qty 3

## 2015-11-06 MED ORDER — HYDRALAZINE HCL 20 MG/ML IJ SOLN: 5.0000 mg | INTRAMUSCULAR | Status: DC | PRN

## 2015-11-06 MED ORDER — IPRATROPIUM-ALBUTEROL 0.5-2.5 (3) MG/3ML IN SOLN
3.0000 mL | RESPIRATORY_TRACT | Status: DC
  Administered 2015-11-06 – 2015-11-10 (×26): 3 mL via RESPIRATORY_TRACT
  Filled 2015-11-06 (×25): qty 3

## 2015-11-06 MED ORDER — IOPAMIDOL (ISOVUE-370) INJECTION 76%
100.0000 mL | Freq: Once | INTRAVENOUS | Status: AC | PRN
  Administered 2015-11-06: 100 mL via INTRAVENOUS

## 2015-11-06 MED ORDER — HYDROCODONE-ACETAMINOPHEN 7.5-325 MG/15ML PO SOLN
10.0000 mL | Freq: Once | ORAL | Status: AC
  Administered 2015-11-06: 10 mL via ORAL
  Filled 2015-11-06: qty 15

## 2015-11-06 MED ORDER — DM-GUAIFENESIN ER 30-600 MG PO TB12
1.0000 | ORAL_TABLET | Freq: Two times a day (BID) | ORAL | Status: DC
  Administered 2015-11-06 – 2015-11-10 (×9): 1 via ORAL
  Filled 2015-11-06 (×9): qty 1

## 2015-11-06 MED ORDER — HEPARIN (PORCINE) IN NACL 100-0.45 UNIT/ML-% IJ SOLN
1200.0000 [IU]/h | INTRAMUSCULAR | Status: DC
  Administered 2015-11-06: 1200 [IU]/h via INTRAVENOUS
  Filled 2015-11-06: qty 250

## 2015-11-06 NOTE — Evaluation (Addendum)
Physical Therapy Evaluation Patient Details Name: [name] MRN: [id number] DOB: 04/10/1960 Today's Date: 11/06/2015   History of Present Illness  55 year old male with history of COPD, asthma, ongoing tobacco use, hypertension, CAD status post stent, DVT, PE on [place] but noncompliant, status post IVC filter placement, HCV, diastolic CHF presented with shortness of breath with cough, wheezing and anterior chest pain.  Clinical Impression  Pt admitted as above and presenting with functional mobility limited by SOB on exertion.  Pt demonstrates good self-awareness and paces self appropriately based on  SOB.  Pt ambulating in halls with good balance but obvious increased effort of breathing (SaO2 on RA 99%).  Pt states he is at or near baseline level of function.  Will dc PT services at this time    Follow Up Recommendations No PT follow up    Equipment Recommendations  None recommended by PT    Recommendations for Other Services       Precautions / Restrictions Precautions Precautions: Fall Restrictions Weight Bearing Restrictions: No      Mobility  Bed Mobility Overal bed mobility: Independent                Transfers Overall transfer level: Independent Equipment used: None                Ambulation/Gait Ambulation/Gait assistance: Supervision Ambulation Distance (Feet): 450 Feet Assistive device: None Gait Pattern/deviations: Step-through pattern;Shuffle;Wide base of support Gait velocity: decr Gait velocity interpretation: Below normal speed for age/gender General Gait Details: Decreased pace with short standing rests to complete task.  SaO2 99% on RA despite pt noted SOB  Stairs            Wheelchair Mobility    Modified Rankin (Stroke Patients Only)       Balance Overall balance assessment: Needs assistance Sitting-balance support: Feet supported;No upper extremity supported Sitting balance-Leahy Scale: Normal     Standing  balance support: No upper extremity supported Standing balance-Leahy Scale: Good                               Pertinent Vitals/Pain Pain Assessment: No/denies pain    Home Living Family/patient expects to be discharged to:: Private residence Living Arrangements: Non-relatives/Friends Available Help at Discharge: Friend(s) Type of Home: House Home Access: Level entry Entrance Stairs-Rails: None   Home Layout: One level Home Equipment: None      Prior Function Level of Independence: Independent         Comments: Reports he does get SOB with activity but knows to pace himself     Hand Dominance        Extremity/Trunk Assessment   Upper Extremity Assessment: Overall WFL for tasks assessed           Lower Extremity Assessment: Generalized weakness         Communication   Communication: No difficulties  Cognition Arousal/Alertness: Awake/alert Behavior During Therapy: WFL for tasks assessed/performed Overall Cognitive Status: Within Functional Limits for tasks assessed                      General Comments      Exercises        Assessment/Plan    PT Assessment Patent does not need any further PT services  PT Diagnosis Difficulty walking   PT Problem List    PT Treatment Interventions     PT Goals (Current goals can be  found in the Care Plan section) Acute Rehab PT Goals Patient Stated Goal: Home tomorrow PT Goal Formulation: All assessment and education complete, DC therapy    Frequency     Barriers to discharge        Co-evaluation               End of Session Equipment Utilized During Treatment: Gait belt Activity Tolerance: Patient tolerated treatment well Patient left: in chair;with call bell/phone within reach Nurse Communication: Mobility status         Time: 1600-1620 PT Time Calculation (min) (ACUTE ONLY): 20 min   Charges:   PT Evaluation $PT Eval Low Complexity: 1 Procedure     PT G  Codes:        Shainna Faux 2015/12/01, 4:46 PM

## 2015-11-06 NOTE — Progress Notes (Signed)
Lab called with corrected heparin level of greater than 2.2. MD notified.  Barbee Shro[place]. Brigitte Pulse, RN

## 2015-11-06 NOTE — ED Notes (Signed)
Report given to [facility].

## 2015-11-06 NOTE — Progress Notes (Signed)
PROGRESS NOTE                                                                                                                                                                                                             Patient Demographics:    [name], is a 55 y.o. male, DOB - 03-Dec-1960, [id number]  Admit date - 11/05/2015   Admitting Physician Ivor Costa, MD  Outpatient Primary MD for the patient is [name] Junior, MD  LOS - 0  Outpatient Specialists:Pulmonary  Chief Complaint  Patient presents with  . Asthma       Brief Narrative   55 year old male with history of COPD, asthma, ongoing tobacco use, hypertension, CAD status post stent, DVT, PE on Ellik [place] but noncompliant, status post IVC filter placement, HCV, diastolic CHF presented with shortness of breath with cough, wheezing and anterior chest pain. Patient reports having these symptoms for past 1 week and progressively getting worse. He also ran out of his albuterol inhaler. He also reported some left sided chest pain radiating to the left flank which is worsened with deep inspiration and coughing. He also was not been taking his anticoagulation for past 5 days. Patient denied fevers, chills, nausea, vomiting, abdominal pain, bowel or urinary symptoms.  In the ED, patient was hypoxic at 87% on room air, tachypneic, afebrile. Blood pressure was elevated to 180/99 mmHg. Labs were unremarkable. A chest x-ray was unremarkable. Patient initially required BiPAP and admitted to stepdown unit. A d-dimer was sent which was elevated to 3.5. Given history of DVT/PE and missing several doses of anticoagulation recently, a CT angiogram of the chest was done which was negative for PE.    Subjective:   Patient reports dating to be much better. Still having some cough  Assessment  & Plan :    Principal Problem:   Acute on chronic respiratory failure with hypoxia  (HCC) Secondary to COPD exacerbation.  Off BiPAP and maintaining O2 sat on room air. Continue IV Solu-Medrol, scheduled DuoNeb's and doxycycline. Supportive care with antitussives. Counseled strongly on smoking cessation. CT angiogram negative for PE. Shows bronchiectasis and bronchitis with scattered 80 as of mucus plugging. Transfer to medical floor.  Active Problems:    History of pulmonary embolism and DVT Elevated d-dimer. CT angiogram negative for acute PE. Per limited Doppler lower extremity  shows bilateral DVT operating is (thrombus in bilateral profundofemoral, right distal femoral vein and left common femoral vein). No leg swellings or tenderness. Resume Eliquis.    Tobacco abuse Counseled on cessation. We will order nicotine patch.     CAD (coronary artery disease) History of stenting. Continue nitroglycerin when necessary      Chronic diastolic CHF (congestive heart failure) ([name]) Euvolemic.  Uncontrolled hypertension Started on amlodipine. When necessary IV hydralazine.       Code Status : Full code  Family Communication  : None at bedside  Disposition Plan  : Home once symptomatically improved. Possibly in the next 48 hours  Barriers For Discharge : Active symptoms  Consults  :  None  Procedures  :  CT angiogram of the chest Doppler lower extremity  DVT Prophylaxis  :  Eliquis  Lab Results  Component Value Date   PLT 131 (L) 11/05/2015    Antibiotics  :   Anti-infectives    Start     Dose/Rate Route Frequency Ordered Stop   11/06/15 0230  doxycycline (VIBRA-TABS) tablet 100 mg     100 mg Oral Every 12 hours 11/06/15 0213          Objective:   Vitals:   11/06/15 0700 11/06/15 0724 11/06/15 0800 11/06/15 0900  BP: (!) 142/98  (!) 146/84 (!) 157/99  Pulse: 74  83 77  Resp: 15  20 (!) 21  Temp:   97.6 F (36.4 C)   TempSrc:   Oral   SpO2: 98% 98% 96% 95%  Weight:      Height:        Wt Readings from Last 3 Encounters:  11/06/15  77.1 kg (169 lb 15.6 oz)  09/09/15 78.4 kg (172 lb 12.8 oz)  06/04/15 72.8 kg (160 lb 9.6 oz)     Intake/Output Summary (Last 24 hours) at 11/06/15 1211 Last data filed at 11/06/15 0900  Gross per 24 hour  Intake           408.65 ml  Output              100 ml  Net           308.65 ml     Physical Exam  Gen: not in distress HEENT: no pallor, moist mucosa, supple neck Chest: Diffuse wheezing bilaterally CVS: N S1&S2, no murmurs, rubs or gallop GI: soft, NT, ND, BS+ Musculoskeletal: warm, no edema     Data Review:    CBC  Recent Labs Lab 11/05/15 2350  WBC 4.8  HGB 13.9  HCT 41.5  PLT 131*  MCV 93.5  MCH 31.3  MCHC 33.5  RDW 14.7  LYMPHSABS 1.4  MONOABS 0.6  EOSABS 0.7  BASOSABS 0.0    Chemistries   Recent Labs Lab 11/05/15 2350  NA 139  K 4.1  CL 109  CO2 23  GLUCOSE 100*  BUN 12  CREATININE 0.93  CALCIUM 9.2   ------------------------------------------------------------------------------------------------------------------ No results for input(s): CHOL, HDL, LDLCALC, TRIG, CHOLHDL, LDLDIRECT in the last 72 hours.  Lab Results  Component Value Date   HGBA1C 5.6 04/27/2014   ------------------------------------------------------------------------------------------------------------------ No results for input(s): TSH, T4TOTAL, T3FREE, THYROIDAB in the last 72 hours.  Invalid input(s): FREET3 ------------------------------------------------------------------------------------------------------------------ No results for input(s): VITAMINB12, FOLATE, FERRITIN, TIBC, IRON, RETICCTPCT in the last 72 hours.  Coagulation profile No results for input(s): INR, PROTIME in the last 168 hours.   Recent Labs  11/06/15 0256  DDIMER 3.34*    Cardiac  Enzymes  Recent Labs Lab 11/06/15 0256 11/06/15 0903  TROPONINI <0.03 <0.03   ------------------------------------------------------------------------------------------------------------------     Component Value Date/Time   BNP 30.7 11/06/2015 0407    Inpatient Medications  Scheduled Meds: . amLODipine  5 mg Oral Daily  . chlorhexidine  15 mL Mouth Rinse BID  . dextromethorphan-guaiFENesin  1 tablet Oral BID  . doxycycline  100 mg Oral Q12H  . ipratropium-albuterol  3 mL Nebulization Q4H  . methylPREDNISolone (SOLU-MEDROL) injection  60 mg Intravenous Q8H  . nicotine  21 mg Transdermal Daily   Continuous Infusions: . sodium chloride 75 mL/hr at 11/06/15 0900  . heparin 1,200 Units/hr (11/06/15 0900)   PRN Meds:.hydrALAZINE, nitroGLYCERIN, oxyCODONE-acetaminophen  Micro Results Recent Results (from the past 240 hour(s))  MRSA PCR Screening     Status: None   Collection Time: 11/06/15  3:35 AM  Result Value Ref Range Status   MRSA by PCR NEGATIVE NEGATIVE Final    Comment:        The GeneXpert MRSA Assay (FDA approved for NASAL specimens only), is one component of a comprehensive MRSA colonization surveillance program. It is not intended to diagnose MRSA infection nor to guide or monitor treatment for MRSA infections.     Radiology Reports Dg Chest 2 View  Result Date: 11/06/2015 CLINICAL DATA:  Wheezing and coughing for 1 week. History of asthma. Smoker. EXAM: CHEST  2 VIEW COMPARISON:  09/08/2015.  Chest CTA dated 09/08/2015. FINDINGS: Normal sized heart. Clear lungs. Thoracic spine degenerative changes. IMPRESSION: No acute abnormality. Electronically Signed   By: Claudie Revering M.D.   On: 11/06/2015 00:16   Ct Angio Chest Pe W Or Wo Contrast  Result Date: 11/06/2015 CLINICAL DATA:  shortness of breath, cough, wheezing for about 1 week, which has been progressively getting worse. He was seen by primary care physician a few days ago and was given refill of inhaler. Pt states he has used most of his inhaler over the last several days. He became acutely worse tonight and called paramedics. He can barely speak in full sentence. He also has chest pain. The chest  pain is located in the left side and left flank area. It is pleuritic, constant, 8 out of 10 in severity. It is aggravated by deep breath and coughing. Patient states that he has not been taking his Eliquis for 5 days, and restarted taking it yesterday. He also has tenderness in both calf areas. EXAM: CT ANGIOGRAPHY CHEST WITH CONTRAST TECHNIQUE: Multidetector CT imaging of the chest was performed using the standard protocol during bolus administration of intravenous contrast. Multiplanar CT image reconstructions and MIPs were obtained to evaluate the vascular anatomy. CONTRAST:  100 mL Isovue 370 IV COMPARISON:  09/08/2015 FINDINGS: Vascular: Right arm IV contrast administration. SVC is patent. Right atrium and right ventricle are nondilated. Satisfactory opacification of pulmonary arteries noted, and there is no evidence of pulmonary emboli. Patent bilateral pulmonary veins drain into the left atrium. Scattered coronary calcifications. Adequate contrast opacification of the thoracic aorta with no evidence of dissection, aneurysm, or stenosis. There is classic 3-vessel brachiocephalic arch anatomy without proximal stenosis. Mediastinum/Lymph Nodes: No masses or pathologically enlarged lymph nodes identified. No pericardial effusion. Lungs/Pleura: Bronchiectasis in bilateral upper lobes and peribronchial thickening centrally scattered areas of mucous plugging without associated airspace disease. No overt interstitial edema. No pleural effusion or pneumothorax. Upper abdomen: No acute findings. Musculoskeletal: Anterior vertebral endplate spurring at multiple levels in the mid and lower thoracic spine. Review of the MIP  images confirms the above findings. IMPRESSION: 1. Negative for acute PE or thoracic aortic dissection. 2. Bronchiectasis and bronchitis with scattered areas of mucous plugging. 3. Atherosclerosis, including coronary artery disease. Please note that although the presence of coronary artery calcium  documents the presence of coronary artery disease, the severity of this disease and any potential stenosis cannot be assessed on this non-gated CT examination. Assessment for potential risk factor modification, dietary therapy or pharmacologic therapy may be warranted, if clinically indicated. Electronically Signed   By: Lucrezia Europe M.D.   On: 11/06/2015 11:02    Time Spent in minutes  25   Louellen Molder M.D on 11/06/2015 at 12:11 PM  Between 7am to 7pm - Pager - [phone]  After 7pm go to [email or web address] - password [facility]  [facility] -  Office  [phone]

## 2015-11-06 NOTE — ED Provider Notes (Addendum)
[place] [facility] DEPT Provider Note   CSN: [id number] Arrival date & time: 11/05/15  2253  First Provider Contact:  First MD Initiated Contact with Patient 11/05/15 2320        History   Chief Complaint Chief Complaint  Patient presents with  . Asthma    HPI [name] is a 55 y.o. male.  He has a history of asthma/COPD. He uses inhalers at home. Family saw his primary care physician a few days ago and was given refill. States he is used most of his inhaler over the last several days. He became acutely worse tonight and called paramedics. He was given DuoNeb en route and arrives still quite dyspneic.  He states he has pain in his left chest with cough. He is not hypoxemic. On room air is 95%. Has had recent cough. No sputum. No fevers no chillsand leg pain or swelling.  Patient has history of DVT and PE. Has an IVC filter. History of coronary artery disease status post NSTEMI 7/15.  HPI  Past Medical History:  Diagnosis Date  . Asthma   . CAD (coronary artery disease)    a. 09/2013 NSTEMI/PCI: LM nl, LAD 40-50%, D1 100 (2.25 x 28 Vision BMS), LCX min irregs, RI 60-70, 30, RCA 40-50/50-90ms/p, EF 45-50%.  b. cath 12/2014 -occulded BMS in diag, 50% pro to mid LAD, 60% ramus, 30% RCA   . Chest pain 08/2015  . Chronic diastolic CHF (congestive heart failure) ([place])    a. 09/2014 EF 45-50% by LV gram;  b. 01/2014 Echo: EF 55-60%, Gr 1 DD.  Marland Kitchen Cocaine abuse   . COPD (chronic obstructive pulmonary disease) ([place])   . DVT (deep venous thrombosis) ([place])    "I had ~ 10 in each leg"  . Essential hypertension   . Hepatitis C   . History of pneumonia   . Osteoarthritis    a. hands and toes.  . Pulmonary embolism ([place])    a. 2012 - s/p IVC filter;  b. prev on eliquis - noncompliant.  . Shortness of breath dyspnea   . Sinus headache   . Tobacco abuse     Patient Active Problem List   Diagnosis Date Noted  . Malnutrition of moderate degree 06/05/2015  . COPD exacerbation ([place])  06/03/2015  . Acute respiratory failure with hypoxia ([place]) 06/03/2015  . Liver fibrosis ([place]) 04/08/2015  . Chest pain   . Pain in the chest   . COPD (chronic obstructive pulmonary disease) ([place]) 01/01/2015  . Essential hypertension 01/01/2015  . Unstable angina ([place]) 01/01/2015  . AKI (acute kidney injury) ([place])   . History of coronary artery disease   . Chronic diastolic CHF (congestive heart failure) ([place])   . Pulmonary embolism ([place])   . DVT (deep venous thrombosis) ([place])   . Acute respiratory failure ([place]) 12/01/2014  . Cocaine abuse 12/01/2014  . Pleuritic chest pain 12/01/2014  . Chronic diastolic heart failure ([place]) 12/01/2014  . QT prolongation 12/01/2014  . Chronic hepatitis C without hepatic coma ([place]) 06/18/2014  . HTN (hypertension) 04/27/2014  . Coronary artery disease involving native coronary artery of native heart without angina pectoris   . CHF (congestive heart failure) ([place]) 01/26/2014  . Acute exacerbation of COPD with asthma ([place]) 01/26/2014  . CAD (coronary artery disease) 10/21/2013  . NSTEMI (non-ST elevated myocardial infarction) ([place]) 10/21/2013  . Anemia 06/20/2013  . Regular alcohol consumption 06/18/2013  . History of DVT (deep vein thrombosis) 02/21/2013  . Thrombocytopenia ([place]) 08/09/2012  .  Tobacco abuse 08/09/2012  . History of noncompliance with medical treatment 03/24/2012  . Atopic dermatitis 03/24/2012  . Asthma, persistent 02/10/2012  . History of pulmonary embolism 02/10/2012    Past Surgical History:  Procedure Laterality Date  . CARDIAC CATHETERIZATION N/A 01/04/2015   Procedure: Left Heart Cath and Coronary Angiography;  Surgeon: Burnell Blanks, MD;  Location: [facility];  Service: Cardiovascular;  Laterality: N/A;  . CORONARY ANGIOPLASTY WITH STENT PLACEMENT  10/21/13   BMS to D1  . LEFT HEART CATHETERIZATION WITH CORONARY ANGIOGRAM N/A 10/21/2013   Procedure: LEFT HEART CATHETERIZATION WITH CORONARY ANGIOGRAM;   Surgeon: Leonie Man, MD;  Location: [facility] CATH LAB;  Service: Cardiovascular;  Laterality: N/A;  . VENA CAVA FILTER PLACEMENT  " ~ 2012       Home Medications    Prior to Admission medications   Medication Sig Start Date End Date Taking? Authorizing Provider  albuterol (PROVENTIL HFA;VENTOLIN HFA) 108 (90 Base) MCG/ACT inhaler Inhale 2 puffs into the lungs every 6 (six) hours as needed for wheezing or shortness of breath. 09/08/15  Yes Mir Marry Guan, MD  apixaban (ELIQUIS) 5 MG TABS tablet Take 1 tablet (5 mg total) by mouth 2 (two) times daily. 09/09/15  Yes Robbie Lis, MD  budesonide-formoterol [facility]) 160-4.5 MCG/ACT inhaler Inhale 2 puffs into the lungs 2 (two) times daily. Patient not taking: Reported on 11/05/2015 09/08/15   Mir Marry Guan, MD  fluticasone [facility]) 50 MCG/ACT nasal spray Place 2 sprays into both nostrils daily as needed for allergies. Patient not taking: Reported on 11/05/2015 09/08/15   Mir Marry Guan, MD  nitroGLYCERIN (NITROSTAT) 0.4 MG SL tablet Place 1 tablet (0.4 mg total) under the tongue every 5 (five) minutes as needed for chest pain (CP or SOB). Patient not taking: Reported on 11/05/2015 09/08/15   Mir Marry Guan, MD  traMADol (ULTRAM) 50 MG tablet Take 2 tablets (100 mg total) by mouth every 6 (six) hours as needed for moderate pain. Patient not taking: Reported on 11/05/2015 09/08/15   Mir Marry Guan, MD    Family History Family History  Problem Relation Age of Onset  . Asthma Mother   . Allergies Mother   . Allergies Sister   . Allergies Brother   . Deep vein thrombosis Brother     two brothers with recurrent DVT  . Heart attack Father     a.60s b. deceased in his 11s  . Coronary artery disease Father     Social History Social History  Substance Use Topics  . Smoking status: Current Every Day Smoker    Packs/day: 0.25    Years: 38.00    Types: Cigarettes  . Smokeless tobacco: Never Used  .  Alcohol use 3.6 oz/week    6 Cans of beer per week     Comment:  "drink  2 40oz beers weekly"     Allergies   Review of patient's allergies indicates no known allergies.   Review of Systems Review of Systems  Constitutional: Negative for appetite change, chills, diaphoresis, fatigue and fever.  HENT: Negative for mouth sores, sore throat and trouble swallowing.   Eyes: Negative for visual disturbance.  Respiratory: Positive for cough, chest tightness and shortness of breath. Negative for wheezing.   Cardiovascular: Negative for chest pain.  Gastrointestinal: Negative for abdominal distention, abdominal pain, diarrhea, nausea and vomiting.  Endocrine: Negative for polydipsia, polyphagia and polyuria.  Genitourinary: Negative for dysuria, frequency and hematuria.  Musculoskeletal: Negative for  gait problem.  Skin: Negative for color change, pallor and rash.  Neurological: Negative for dizziness, syncope, light-headedness and headaches.  Hematological: Does not bruise/bleed easily.  Psychiatric/Behavioral: Negative for behavioral problems and confusion.     Physical Exam Updated Vital Signs BP 180/99   Pulse 96   Temp 98.9 F (37.2 C) (Oral)   Resp (!) 38   Ht 6\' 2"  (1.88 m)   Wt 178 lb (80.7 kg)   SpO2 97%   BMI 22.85 kg/m   Physical Exam  Constitutional: He is oriented to person, place, and time. He appears well-developed and well-nourished. No distress.  HENT:  Head: Normocephalic.  Eyes: Conjunctivae are normal. Pupils are equal, round, and reactive to light. No scleral icterus.  Neck: Normal range of motion. Neck supple. No thyromegaly present.  Cardiovascular: Normal rate and regular rhythm.  Exam reveals no gallop and no friction rub.   No murmur heard. Pulmonary/Chest: Effort normal and breath sounds normal. No respiratory distress. He has no wheezes. He has no rales.  Wheezing or prolongation in all fields. Prolongation of his expiratory phase. No focal  diminished breath sound. No crackles.  Abdominal: Soft. Bowel sounds are normal. He exhibits no distension. There is no tenderness. There is no rebound.  Musculoskeletal: Normal range of motion.  Neurological: He is alert and oriented to person, place, and time.  Skin: Skin is warm and dry. No rash noted.  Psychiatric: He has a normal mood and affect. His behavior is normal.     ED Treatments / Results  Labs (all labs ordered are listed, but only abnormal results are displayed) Labs Reviewed  CBC WITH DIFFERENTIAL/PLATELET - Abnormal; Notable for the following:       Result Value   Platelets 131 (*)    All other components within normal limits  BASIC METABOLIC PANEL - Abnormal; Notable for the following:    Glucose, Bld 100 (*)    All other components within normal limits  BLOOD GAS, ARTERIAL  I-STAT TROPOININ, ED    EKG  EKG Interpretation None       Radiology Dg Chest 2 View  Result Date: 11/06/2015 CLINICAL DATA:  Wheezing and coughing for 1 week. History of asthma. Smoker. EXAM: CHEST  2 VIEW COMPARISON:  09/08/2015.  Chest CTA dated 09/08/2015. FINDINGS: Normal sized heart. Clear lungs. Thoracic spine degenerative changes. IMPRESSION: No acute abnormality. Electronically Signed   By: Claudie Revering M.D.   On: 11/06/2015 00:16    Procedures Procedures (including critical care time)  Medications Ordered in ED Medications  methylPREDNISolone sodium succinate (SOLU-MEDROL) 125 mg/2 mL injection 125 mg (125 mg Intravenous Given 11/05/15 2355)  HYDROcodone-acetaminophen (HYCET) 7.5-325 mg/15 ml solution 10 mL (10 mLs Oral Given 11/06/15 0033)  benzonatate (TESSALON) capsule 200 mg (200 mg Oral Given 11/06/15 0032)  albuterol (PROVENTIL,VENTOLIN) solution continuous neb (10 mg/hr Nebulization Given 11/06/15 0050)     Initial Impression / Assessment and Plan / ED Course  I have reviewed the triage vital signs and the nursing notes.  Pertinent labs & imaging results that  were available during my care of the patient were reviewed by me and considered in my medical decision making (see chart for details).  Clinical Course    Given additional nebulized albuterol, tenuous 1 hour.. EKG shows sinus rhythm, QTC 499. Chest x-ray shows no infiltrates or effusions. Given IV steroids. Remains tight and dyspneic. However awake and alert. Able to communicate. Does not appear fatigued. We'll obtain an ABG. Consultation with  hospitalist for admission.  Final Clinical Impressions(s) / ED Diagnoses   Final diagnoses:  COPD exacerbation [facility])    New Prescriptions New Prescriptions   No medications on file     Tanna Furry, MD 11/06/15 0141    Tanna Furry, MD 11/06/15 [phone]

## 2015-11-06 NOTE — Progress Notes (Signed)
Pt BP 174/104. MD notified. New orders given. Will continue to monitor.

## 2015-11-06 NOTE — ED Notes (Signed)
Jessica from respiratory called to transport patient with this RN.

## 2015-11-06 NOTE — Progress Notes (Signed)
ANTICOAGULATION CONSULT NOTE - Initial Consult  Pharmacy Consult for Heparin Indication: H/O Pulmonary Embolism/DVT  No Known Allergies  Patient Measurements: Height: 6\' 2"  (188 cm) Weight: 169 lb 15.6 oz (77.1 kg) IBW/kg (Calculated) : 82.2 Heparin Dosing Weight: actual body weight  Vital Signs: Temp: 98.9 F (37.2 C) (08/12 0400) Temp Source: Axillary (08/12 0400) BP: 176/103 (08/12 0400) Pulse Rate: 79 (08/12 0400)  Labs:  Recent Labs  11/05/15 2350 11/06/15 0256  HGB 13.9  --   HCT 41.5  --   PLT 131*  --   APTT  --  29  CREATININE 0.93  --   TROPONINI  --  <0.03    Estimated Creatinine Clearance: 97.9 mL/min (by C-G formula based on SCr of 0.93 mg/dL).   Medical History: Past Medical History:  Diagnosis Date  . Asthma   . CAD (coronary artery disease)    a. 09/2013 NSTEMI/PCI: LM nl, LAD 40-50%, D1 100 (2.25 x 28 Vision BMS), LCX min irregs, RI 60-70, 30, RCA 40-50/50-24ms/p, EF 45-50%.  b. cath 12/2014 -occulded BMS in diag, 50% pro to mid LAD, 60% ramus, 30% RCA   . Chest pain 08/2015  . Chronic diastolic CHF (congestive heart failure) ([place])    a. 09/2014 EF 45-50% by LV gram;  b. 01/2014 Echo: EF 55-60%, Gr 1 DD.  Marland Kitchen Cocaine abuse   . COPD (chronic obstructive pulmonary disease) ([place])   . DVT (deep venous thrombosis) ([place])    "I had ~ 10 in each leg"  . Essential hypertension   . Hepatitis C   . History of pneumonia   . Osteoarthritis    a. hands and toes.  . Pulmonary embolism ([place])    a. 2012 - s/p IVC filter;  b. prev on eliquis - noncompliant.  . Shortness of breath dyspnea   . Sinus headache   . Tobacco abuse     Assessment:  55 yr male presents with complaints of shortness of breath, coughing, wheezing and chest pain.  PMH includes asthma, COPD, DVT, PE, IVC filter and patient on Eliquis (MD notes patient is noncompliant).  D-Dimer elevated and plans for CTAngio to r/o new PE and for dopplers to r/o new DVT  Pharmacy consulted to begin IV  heparin  Last dose of Eliquis 5mg  reported as taken on 11/05/15 @ 17:30  As Eliquis can falsely elevate heparin level, will need to monitor heparin therapy with both aPTT & heparin levels until they correlate, then continue monitoring with just heparin levels.  Goal of Therapy:  Heparin level 0.3-0.7 units/ml aPTT 66-102 seconds Monitor platelets by anticoagulation protocol: Yes   Plan:   Obtain baseline aPTT and heparin level  APTT = 29 sec  Heparin level drawn @ 02:53 still in process  12 hr last Eliquis dose, will plan to begin IV heparin  At 06:00 start IV heparin gtt @ 1200 units/hr  Check aPTT & heparin level 6 hr after heparin started  If baseline heparin level is not elevated and correlates with aPTT, can monitor with only heparin levels.  Everette Rank 11/06/2015,5:22 AM

## 2015-11-06 NOTE — Progress Notes (Signed)
Assumed care of patient. Agree with previous nurse assessment. Patient has no complaints or concerns at this time, in no acute distress. Requests warm blanket which was provided by NT.  Barbee Shropshire. Brigitte Pulse, RN

## 2015-11-06 NOTE — Evaluation (Signed)
  Occupational Therapy Evaluation Patient Details Name: [name] MRN: [id number] DOB: Oct 26, 1960 Today's Date: 11-Nov-2015    History of Present Illness 55 year old male with history of COPD, asthma, ongoing tobacco use, hypertension, CAD status post stent, DVT, PE on Ellik [place] but noncompliant, status post IVC filter placement, HCV, diastolic CHF presented with shortness of breath with cough, wheezing and anterior chest pain.   Clinical Impression   Pt overall I with ADL activity with increased time. Pt aware of energy conservation stating he takes his time with ADL 's    Follow Up Recommendations  No OT follow up    Equipment Recommendations  None recommended by OT    Recommendations for Other Services       Precautions / Restrictions Restrictions Weight Bearing Restrictions: No      Mobility Bed Mobility Overal bed mobility: Independent                Transfers Overall transfer level: Independent                         ADL Overall ADL's : At baseline                                       General ADL Comments: pt states he takes it slow and takes his time.  Pt is I with ADL activity with increased time               Pertinent Vitals/Pain Pain Assessment: No/denies pain     Hand Dominance     Extremity/Trunk Assessment Upper Extremity Assessment Upper Extremity Assessment: Overall WFL for tasks assessed           Communication Communication Communication: No difficulties   Cognition Arousal/Alertness: Awake/alert Behavior During Therapy: WFL for tasks assessed/performed Overall Cognitive Status: Within Functional Limits for tasks assessed                                Home Living Family/patient expects to be discharged to:: Private residence Living Arrangements: Non-relatives/Friends Available Help at Discharge: Friend(s) Type of Home: House       Home Layout: One level     Bathroom  Shower/Tub: Teacher, early years/pre: Standard                Prior Functioning/Environment Level of Independence: Independent        Comments: Reports he does get SOB with activity at times.                    End of Session Equipment Utilized During Treatment: Gait belt  Activity Tolerance: Patient tolerated treatment well Patient left: in chair;with call bell/phone within reach   Time: [id number] OT Time Calculation (min): 17 min Charges:  OT General Charges $OT Visit: 1 Procedure OT Evaluation $OT Eval Moderate Complexity: 1 Procedure G-Codes:    Betsy Pries 11/11/15, 2:44 PM

## 2015-11-06 NOTE — Progress Notes (Signed)
*  Preliminary Results* Bilateral lower extremity venous duplex completed. Bilateral lower extremities are positive for deep vein thrombosis of varying age. Acute thrombus is noted in the bilateral profunda femoral veins, right distal femoral vein, and left common femoral vein. Chronic thrombus is noted in the right mid femoral vein, bilateral popliteal veins, left saphenofemoral junction, and left distal femoral vein. There is no evidence of Baker's cyst bilaterally.  Preliminary results discussed with Clarise Cruz, RN.  11/06/2015 9:30 AM Maudry Mayhew, BS, RVT, RDCS, RDMS

## 2015-11-06 NOTE — H&P (Addendum)
History and Physical    [name] [id number] DOB: 29-Oct-1960 DOA: 11/05/2015  Referring MD/NP/PA:   PCP: [name], MD   Patient coming from:  The patient is coming from home.  At baseline, pt is independent for most of ADL.     Chief Complaint: Shortness of breath, cough, wheezing, chest pain  HPI: [name] is a 55 y.o. male with medical history significant of COPD, asthma, tobacco abuse, hypertension, CAD, s/p of stent, DVT, PE on Eliquis (not compliant), s/p of Vena Cava filter placement, HCV, dCHF, who presents with shortness of breath, cough, wheezing and chest pain.  Patient reports that he has been having shortness of breath, cough, wheezing for about 1 week, which has been progressively getting worse. He was seen by primary care physician a few days ago and was given refill of inhaler. Pt states he has used most of his inhaler over the last several days. He became acutely worse tonight and called paramedics. He can barely speak in full sentence. He also has chest pain. The chest pain is located in the left side and left flank area. It is pleuritic, constant, 8 out of 10 in severity. It is aggravated by deep breath and coughing. Patient states that he has not been taking his Eliquis for 5 days, and restarted taking it yesterday. He also has tenderness in both calf areas. Patient does not have nausea, vomiting, diarrhea, abdominal pain, symptoms of UTI or unilateral weakness. No fever or chills.  ED Course: pt was found to have oxygen saturation to 87% on room air, WBC 4.8, heart rate 90s, tachypnea, temperature normal, elevated blood pressure 180/99, electrolytes and renal function okay. Chest x-rays negative for infiltration. Pt is admitted to stepdown as inpatient for further eval and treatment.  Review of Systems:   General: no fevers, chills, no changes in body weight, has poor appetite, has fatigue HEENT: no blurry vision, hearing changes or sore  throat Pulm: has dyspnea, coughing, wheezing CV: has chest pain, no palpitations Abd: no nausea, vomiting, abdominal pain, diarrhea, constipation GU: no dysuria, burning on urination, increased urinary frequency, hematuria  Ext: no leg edema. Has pain in both calf areas. Neuro: no unilateral weakness, numbness, or tingling, no vision change or hearing loss Skin: no rash MSK: No muscle spasm, no deformity, no limitation of range of movement in spin Heme: No easy bruising.  Travel history: No recent long distant travel.  Allergy: No Known Allergies  Past Medical History:  Diagnosis Date  . Asthma   . CAD (coronary artery disease)    a. 09/2013 NSTEMI/PCI: LM nl, LAD 40-50%, D1 100 (2.25 x 28 Vision BMS), LCX min irregs, RI 60-70, 30, RCA 40-50/50-57ms/p, EF 45-50%.  b. cath 12/2014 -occulded BMS in diag, 50% pro to mid LAD, 60% ramus, 30% RCA   . Chest pain 08/2015  . Chronic diastolic CHF (congestive heart failure) ([place])    a. 09/2014 EF 45-50% by LV gram;  b. 01/2014 Echo: EF 55-60%, Gr 1 DD.  Marland Kitchen Cocaine abuse   . COPD (chronic obstructive pulmonary disease) ([place])   . DVT (deep venous thrombosis) ([place])    "I had ~ 10 in each leg"  . Essential hypertension   . Hepatitis C   . History of pneumonia   . Osteoarthritis    a. hands and toes.  . Pulmonary embolism ([place])    a. 2012 - s/p IVC filter;  b. prev on eliquis - noncompliant.  . Shortness of breath  dyspnea   . Sinus headache   . Tobacco abuse     Past Surgical History:  Procedure Laterality Date  . CARDIAC CATHETERIZATION N/A 01/04/2015   Procedure: Left Heart Cath and Coronary Angiography;  Surgeon: Burnell Blanks, MD;  Location: [facility];  Service: Cardiovascular;  Laterality: N/A;  . CORONARY ANGIOPLASTY WITH STENT PLACEMENT  10/21/13   BMS to D1  . LEFT HEART CATHETERIZATION WITH CORONARY ANGIOGRAM N/A 10/21/2013   Procedure: LEFT HEART CATHETERIZATION WITH CORONARY ANGIOGRAM;  Surgeon: Leonie Man,  MD;  Location: [facility];  Service: Cardiovascular;  Laterality: N/A;  . VENA CAVA FILTER PLACEMENT  " ~ 2012    Social History:  reports that he has been smoking Cigarettes.  He has a 9.50 pack-year smoking history. He has never used smokeless tobacco. He reports that he drinks about 3.6 oz of alcohol per week . He reports that he does not use drugs.  Family History:  Family History  Problem Relation Age of Onset  . Asthma Mother   . Allergies Mother   . Allergies Sister   . Allergies Brother   . Deep vein thrombosis Brother     two brothers with recurrent DVT  . Heart attack Father     a.60s b. deceased in his 54s  . Coronary artery disease Father      Prior to Admission medications   Medication Sig Start Date End Date Taking? Authorizing Provider  albuterol (PROVENTIL HFA;VENTOLIN HFA) 108 (90 Base) MCG/ACT inhaler Inhale 2 puffs into the lungs every 6 (six) hours as needed for wheezing or shortness of breath. 09/08/15  Yes Mir Marry Guan, MD  apixaban (ELIQUIS) 5 MG TABS tablet Take 1 tablet (5 mg total) by mouth 2 (two) times daily. 09/09/15  Yes Robbie Lis, MD  budesonide-formoterol [facility]) 160-4.5 MCG/ACT inhaler Inhale 2 puffs into the lungs 2 (two) times daily. Patient not taking: Reported on 11/05/2015 09/08/15   Mir Marry Guan, MD  fluticasone [facility]) 50 MCG/ACT nasal spray Place 2 sprays into both nostrils daily as needed for allergies. Patient not taking: Reported on 11/05/2015 09/08/15   Mir Marry Guan, MD  nitroGLYCERIN (NITROSTAT) 0.4 MG SL tablet Place 1 tablet (0.4 mg total) under the tongue every 5 (five) minutes as needed for chest pain (CP or SOB). Patient not taking: Reported on 11/05/2015 09/08/15   Mir Marry Guan, MD  traMADol (ULTRAM) 50 MG tablet Take 2 tablets (100 mg total) by mouth every 6 (six) hours as needed for moderate pain. Patient not taking: Reported on 11/05/2015 09/08/15   Mir Marry Guan, MD     Physical Exam: Vitals:   11/06/15 0130 11/06/15 0200 11/06/15 0228 11/06/15 0230  BP: (!) 168/111 (!) 174/110 (!) 174/110 (!) 168/105  Pulse: 93 85 83 80  Resp: 22 (!) 30 (!) 28 24  Temp:      TempSrc:      SpO2: 100% 97% 96% 99%  Weight:      Height:       General: Not in acute distress HEENT:       Eyes: PERRL, EOMI, no scleral icterus.       ENT: No discharge from the ears and nose, no pharynx injection, no tonsillar enlargement.        Neck: No JVD, no bruit, no mass felt. Heme: No neck lymph node enlargement. Cardiac: S1/S2, RRR, No murmurs, No gallops or rubs. Pulm: Has diffused wheezing bilaterally bilaterally. No rales or  rubs. Abd: Soft, nondistended, nontender, no rebound pain, no organomegaly, BS present. GU: No hematuria Ext: No pitting leg edema bilaterally. 2+DP/PT pulse bilaterally. Has tenderness in both calf areas. Musculoskeletal: No joint deformities, No joint redness or warmth, no limitation of ROM in spin. Skin: No rashes.  Neuro: Alert, oriented X3, cranial nerves II-XII grossly intact, moves all extremities normally. Psych: Patient is not psychotic, no suicidal or hemocidal ideation.  Labs on Admission: I have personally reviewed following labs and imaging studies  CBC:  Recent Labs Lab 11/05/15 2350  WBC 4.8  NEUTROABS 2.1  HGB 13.9  HCT 41.5  MCV 93.5  PLT [id number]*   Basic Metabolic Panel:  Recent Labs Lab 11/05/15 2350  NA 139  K 4.1  CL 109  CO2 23  GLUCOSE 100*  BUN 12  CREATININE 0.93  CALCIUM 9.2   GFR: Estimated Creatinine Clearance: 102.4 mL/min (by C-G formula based on SCr of 0.93 mg/dL). Liver Function Tests: No results for input(s): AST, ALT, ALKPHOS, BILITOT, PROT, ALBUMIN in the last 168 hours. No results for input(s): LIPASE, AMYLASE in the last 168 hours. No results for input(s): AMMONIA in the last 168 hours. Coagulation Profile: No results for input(s): INR, PROTIME in the last 168 hours. Cardiac Enzymes: No  results for input(s): CKTOTAL, CKMB, CKMBINDEX, TROPONINI in the last 168 hours. BNP (last 3 results) No results for input(s): PROBNP in the last 8760 hours. HbA1C: No results for input(s): HGBA1C in the last 72 hours. CBG: No results for input(s): GLUCAP in the last 168 hours. Lipid Profile: No results for input(s): CHOL, HDL, LDLCALC, TRIG, CHOLHDL, LDLDIRECT in the last 72 hours. Thyroid Function Tests: No results for input(s): TSH, T4TOTAL, FREET4, T3FREE, THYROIDAB in the last 72 hours. Anemia Panel: No results for input(s): VITAMINB12, FOLATE, FERRITIN, TIBC, IRON, RETICCTPCT in the last 72 hours. Urine analysis:    Component Value Date/Time   COLORURINE YELLOW 09/08/2015 0411   APPEARANCEUR HAZY (A) 09/08/2015 0411   LABSPEC 1.011 09/08/2015 0411   PHURINE 5.5 09/08/2015 0411   GLUCOSEU NEGATIVE 09/08/2015 0411   HGBUR NEGATIVE 09/08/2015 0411   BILIRUBINUR NEGATIVE 09/08/2015 0411   KETONESUR NEGATIVE 09/08/2015 0411   PROTEINUR NEGATIVE 09/08/2015 0411   UROBILINOGEN 0.2 03/28/2012 0118   NITRITE NEGATIVE 09/08/2015 0411   LEUKOCYTESUR NEGATIVE 09/08/2015 0411   Sepsis Labs: @LABRCNTIP (procalcitonin:4,lacticidven:4) )No results found for this or any previous visit (from the past 240 hour(s)).   Radiological Exams on Admission: Dg Chest 2 View  Result Date: 11/06/2015 CLINICAL DATA:  Wheezing and coughing for 1 week. History of asthma. Smoker. EXAM: CHEST  2 VIEW COMPARISON:  09/08/2015.  Chest CTA dated 09/08/2015. FINDINGS: Normal sized heart. Clear lungs. Thoracic spine degenerative changes. IMPRESSION: No acute abnormality. Electronically Signed   By: Claudie Revering M.D.   On: 11/06/2015 00:16     EKG: Independently reviewed. Sinus rhythm, QTC 499, LVH, nonspecific T-wave change.  Assessment/Plan Principal Problem:   Acute on chronic respiratory failure with hypoxia (HCC) Active Problems:   Asthma exacerbation   History of pulmonary embolism   Tobacco abuse    History of DVT (deep vein thrombosis)   CAD (coronary artery disease)   Acute exacerbation of COPD with asthma (HCC)   Essential hypertension   Chronic diastolic CHF (congestive heart failure) (HCC)   Pulmonary embolism (HCC)   Acute on chronic respiratory failure with hypoxia ([address]): Most likely due to asthma/COPD exacerbation given diffused wheezing on auscultation. No infiltration on chest x-ray. Potential  differential diagnosis is pulmonary embolism. Patient has history of PE and DVT, but has not been compliant to taking Eliquis. Now has pleuritic chest pain, will need to r/o PE.  -will admit patient to SDU -start BiPAP -check ABG -Nebulizers: scheduled Duoneb q3h and prn albuterol nebs q2h -Solu-Medrol 60 mg IV tid (received 125 mg of Solu-Medrol  In ED) -Oral doxycycline for 5 days.  -Mucinex for cough  -Urine S. pneumococcal antigen -Follow up blood culture x2, sputum culture, respiratory virus panel -check D-dimer. If positive-->will get CTA to r/o new PE  CODP and asthma exacerbation: -See above  History of pulmonary embolism and DVT: not compliant to taking Eliquis. Now has pleuritic chest pain, will need to r/o PE. -check D-dimer. If positive-->will get CTA to r/o new PE -start IV heparin now  Addendum: D-dimer is 3.34 -will get CTA to r/o PE -get LE venous doppler to r/o new DVT  Tobacco abuse: -Did counseling about importance of quitting smoking -Nicotine patch  CAD (coronary artery disease): s/p of stent. Has pleuritic chest pain which is likely due to cough and and possible PE -Urine drug screen -Continue home when necessary nitroglycerin -Troponin 3  Chronic diastolic CHF (congestive heart failure) ([place]): 2-D echo on 01/02/15 showed EF 55-60% with grade 1 diastolic dysfunction. Patient not taking diuretics. No leg edema JVD. Symmetric compensated on admission. -check BNP  HTN: Blood pressure 180/99. Patient not taking medications at home. -Start amlodipine  5 mg daily -IV hydralazine when necessary   DVT ppx: On IV Heparin   Code Status: Full code Family Communication: None at bed side.  Disposition Plan:  Anticipate discharge back to previous home environment Consults called:  none Admission status:  SDU/inpation       Date of Service 11/06/2015    Ivor Costa [facility] Pager [phone]  If 7PM-7AM, please contact night-coverage [email or web address] Password [email or web address] 11/06/2015, 2:40 AM

## 2015-11-07 DIAGNOSIS — I82403 Acute embolism and thrombosis of unspecified deep veins of lower extremity, bilateral: Secondary | ICD-10-CM

## 2015-11-07 DIAGNOSIS — D696 Thrombocytopenia, unspecified: Secondary | ICD-10-CM

## 2015-11-07 LAB — CBC
HEMATOCRIT: 37.1 % — AB (ref 39.0–52.0)
HEMOGLOBIN: 12.5 g/dL — AB (ref 13.0–17.0)
MCH: 31.2 pg (ref 26.0–34.0)
MCHC: 33.7 g/dL (ref 30.0–36.0)
MCV: 92.5 fL (ref 78.0–100.0)
Platelets: 112 10*3/uL — ABNORMAL LOW (ref 150–400)
RBC: 4.01 MIL/uL — ABNORMAL LOW (ref 4.22–5.81)
RDW: 14.7 % (ref 11.5–15.5)
WBC: 2.9 10*3/uL — ABNORMAL LOW (ref 4.0–10.5)

## 2015-11-07 MED ORDER — APIXABAN 5 MG PO TABS
10.0000 mg | ORAL_TABLET | Freq: Two times a day (BID) | ORAL | Status: DC
  Administered 2015-11-07 – 2015-11-10 (×7): 10 mg via ORAL
  Filled 2015-11-07 (×8): qty 2

## 2015-11-07 MED ORDER — KETOROLAC TROMETHAMINE 15 MG/ML IJ SOLN
15.0000 mg | Freq: Four times a day (QID) | INTRAMUSCULAR | Status: DC | PRN
  Administered 2015-11-07 – 2015-11-08 (×2): 15 mg via INTRAVENOUS
  Filled 2015-11-07 (×2): qty 1

## 2015-11-07 MED ORDER — APIXABAN 5 MG PO TABS: 5.0000 mg | ORAL_TABLET | Freq: Two times a day (BID) | ORAL | Status: DC

## 2015-11-07 MED ORDER — GUAIFENESIN-DM 100-10 MG/5ML PO SYRP
5.0000 mL | ORAL_SOLUTION | ORAL | Status: DC | PRN
  Administered 2015-11-07 (×3): 5 mL via ORAL
  Filled 2015-11-07 (×3): qty 10

## 2015-11-07 MED ORDER — BUDESONIDE 0.25 MG/2ML IN SUSP
0.2500 mg | Freq: Two times a day (BID) | RESPIRATORY_TRACT | Status: DC
Start: 2015-11-07 — End: 2015-11-10
  Administered 2015-11-07 – 2015-11-10 (×7): 0.25 mg via RESPIRATORY_TRACT
  Filled 2015-11-07 (×7): qty 2

## 2015-11-07 NOTE — Discharge Instructions (Signed)
Information on my medicine - ELIQUIS (apixaban)  This medication education was reviewed with me or my healthcare representative as part of my discharge preparation.  The pharmacist that spoke with me during my hospital stay was:  Clovis Riley, [facility]  Why was Eliquis prescribed for you? Eliquis was prescribed to treat blood clots that may have been found in the veins of your legs (deep vein thrombosis) or in your lungs (pulmonary embolism) and to reduce the risk of them occurring again.  What do You need to know about Eliquis ? The starting dose is 10 mg (two 5 mg tablets) taken TWICE daily for the FIRST SEVEN (7) DAYS, then on Sunday 11/14/2015  the dose is reduced to ONE 5 mg tablet taken TWICE daily.  Eliquis may be taken with or without food.   Try to take the dose about the same time in the morning and in the evening. If you have difficulty swallowing the tablet whole please discuss with your pharmacist how to take the medication safely.  Take Eliquis exactly as prescribed and DO NOT stop taking Eliquis without talking to the doctor who prescribed the medication.  Stopping may increase your risk of developing a new blood clot.  Refill your prescription before you run out.  After discharge, you should have regular check-up appointments with your healthcare provider that is prescribing your Eliquis.    What do you do if you miss a dose? If a dose of ELIQUIS is not taken at the scheduled time, take it as soon as possible on the same day and twice-daily administration should be resumed. The dose should not be doubled to make up for a missed dose.  Important Safety Information A possible side effect of Eliquis is bleeding. You should call your healthcare provider right away if you experience any of the following: ? Bleeding from an injury or your nose that does not stop. ? Unusual colored urine (red or dark brown) or unusual colored stools (red or black). ? Unusual bruising  for unknown reasons. ? A serious fall or if you hit your head (even if there is no bleeding).  Some medicines may interact with Eliquis and might increase your risk of bleeding or clotting while on Eliquis. To help avoid this, consult your healthcare provider or pharmacist prior to using any new prescription or non-prescription medications, including herbals, vitamins, non-steroidal anti-inflammatory drugs (NSAIDs) and supplements.  This website has more information on Eliquis (apixaban): http://www.eliquis.com/eliquis/home

## 2015-11-07 NOTE — Progress Notes (Signed)
Nutrition Brief Note  Patient identified on the Malnutrition Screening Tool (MST) Report  Patient with stable weight and is eating 100% x multiple meals.  Wt Readings from Last 15 Encounters:  11/06/15 169 lb 15.6 oz (77.1 kg)  09/09/15 172 lb 12.8 oz (78.4 kg)  06/04/15 160 lb 9.6 oz (72.8 kg)  04/08/15 171 lb (77.6 kg)  02/24/15 172 lb (78 kg)  01/04/15 164 lb 3.2 oz (74.5 kg)  12/02/14 178 lb 9.6 oz (81 kg)  10/22/14 176 lb 12.8 oz (80.2 kg)  06/18/14 190 lb 13.5 oz (86.6 kg)  05/01/14 184 lb 1.6 oz (83.5 kg)  01/28/14 170 lb 8 oz (77.3 kg)  10/21/13 166 lb 14.2 oz (75.7 kg)  06/18/13 156 lb 15.5 oz (71.2 kg)  06/03/13 160 lb (72.6 kg)  03/25/13 170 lb (77.1 kg)    Body mass index is 21.82 kg/m. Patient meets criteria for normal range based on current BMI.   Current diet order is Heart Healthy, patient is consuming approximately 100% x multiple meals at this time. Labs and medications reviewed.   No nutrition interventions warranted at this time. If nutrition issues arise, please consult RD.   Clayton Bibles, MS, RD, LDN Pager: [phone] After Hours Pager: [phone]

## 2015-11-07 NOTE — Progress Notes (Signed)
respiratory virus panel negative. Droplet precautions d/c'ed.  Barbee Shropshire. Brigitte Pulse, RN

## 2015-11-07 NOTE — Progress Notes (Addendum)
PROGRESS NOTE                                                                                                                                                                                                             Patient Demographics:    [name], is a 55 y.o. male, DOB - 05-May-1960, [id number]  Admit date - 11/05/2015   Admitting Physician Ivor Costa, MD  Outpatient Primary MD for the patient is [name], MD  LOS - 1  Outpatient Specialists:Pulmonary  Chief Complaint  Patient presents with  . Asthma       Brief Narrative   55 year old male with history of COPD, asthma, ongoing tobacco use, hypertension, CAD status post stent, DVT, PE on [place] but noncompliant, status post IVC filter placement, HCV, diastolic CHF presented with shortness of breath with cough, wheezing and anterior chest pain. Patient reports having these symptoms for past 1 week and progressively getting worse. He also ran out of his albuterol inhaler. He also reported some left sided chest pain radiating to the left flank which is worsened with deep inspiration and coughing. He also was not been taking his anticoagulation for past 5 days. Patient denied fevers, chills, nausea, vomiting, abdominal pain, bowel or urinary symptoms.  In the ED, patient was hypoxic at 87% on room air, tachypneic, afebrile. Blood pressure was elevated to 180/99 mmHg. Labs were unremarkable. A chest x-ray was unremarkable. Patient initially required BiPAP and admitted to stepdown unit. D-dimer was elevated and was followed by a CT angiogram of the chest which was negative for PE but Doppler showed acute bilateral lower extremity DVT.    Subjective:   Having cough with pleuritic chest discomfort.  Assessment  & Plan :    Principal Problem:   Acute on chronic respiratory failure with hypoxia (HCC) Secondary to COPD exacerbation.  Maintaining on room  air. Still very wheezy on exam. Continue IV Solu-Medrol, scheduled DuoNeb's and doxycycline. Add Pulmicort nebs twice a day. Supportive care with antitussives. Counseled strongly on smoking cessation. Nicotine patch. CT angiogram negative for PE. Shows bronchiectasis and bronchitis with scattered areas of mucus plugging.   Active Problems:    History of pulmonary embolism and acute DVT D-dimer elevated. CT angiogram negative for acute PE. Per limited Doppler lower extremity shows acute bilateral DVT.  No leg  swellings or tenderness. Has history of IVC filter. Patient nonadherent to Eliquis for past few days. Resumed at a higher dose (10 mg twice a day for 7 days followed by 5 mg twice a day). counseled on adherence.     Tobacco abuse Counseled on cessation. nicotine patch.     CAD (coronary artery disease) History of stenting. Continue nitroglycerin when necessary      Chronic diastolic CHF (congestive heart failure) ([place]) Euvolemic.  Uncontrolled hypertension Started on amlodipine. Will increase dose. When necessary IV hydralazine.  Pancytopenia Possibly due to acute illness. Monitor closely.      Code Status : Full code  Family Communication  : None at bedside  Disposition Plan  : Home once symptomatically improved. Possibly in the next 48 hours  Barriers For Discharge : Active symptoms  Consults  :  None  Procedures  :  CT angiogram of the chest Doppler lower extremity  DVT Prophylaxis  :  Eliquis  Lab Results  Component Value Date   PLT 112 (L) 11/07/2015    Antibiotics  :   Anti-infectives    Start     Dose/Rate Route Frequency Ordered Stop   11/06/15 0230  doxycycline (VIBRA-TABS) tablet 100 mg     100 mg Oral Every 12 hours 11/06/15 0213          Objective:   Vitals:   11/07/15 0321 11/07/15 0535 11/07/15 0733 11/07/15 1111  BP:  (!) 144/90    Pulse:  67    Resp:  18    Temp:  98.6 F (37 C)    TempSrc:  Oral    SpO2: 97% 95% 96% 96%    Weight:      Height:        Wt Readings from Last 3 Encounters:  11/06/15 77.1 kg (169 lb 15.6 oz)  09/09/15 78.4 kg (172 lb 12.8 oz)  06/04/15 72.8 kg (160 lb 9.6 oz)     Intake/Output Summary (Last 24 hours) at 11/07/15 1354 Last data filed at 11/06/15 1500  Gross per 24 hour  Intake              240 ml  Output                0 ml  Net              240 ml     Physical Exam  Gen: not in distress HEENT: no pallor, moist mucosa, supple neck Chest: Bilateral diffuse wheezing CVS: N S1&S2, no murmurs, rubs or gallop GI: soft, NT, ND, BS+ Musculoskeletal: warm, no edema     Data Review:    CBC  Recent Labs Lab 11/05/15 2350 11/07/15 0447  WBC 4.8 2.9*  HGB 13.9 12.5*  HCT 41.5 37.1*  PLT 131* 112*  MCV 93.5 92.5  MCH 31.3 31.2  MCHC 33.5 33.7  RDW 14.7 14.7  LYMPHSABS 1.4  --   MONOABS 0.6  --   EOSABS 0.7  --   BASOSABS 0.0  --     Chemistries   Recent Labs Lab 11/05/15 2350  NA 139  K 4.1  CL 109  CO2 23  GLUCOSE 100*  BUN 12  CREATININE 0.93  CALCIUM 9.2   ------------------------------------------------------------------------------------------------------------------ No results for input(s): CHOL, HDL, LDLCALC, TRIG, CHOLHDL, LDLDIRECT in the last 72 hours.  Lab Results  Component Value Date   HGBA1C 5.6 04/27/2014   ------------------------------------------------------------------------------------------------------------------ No results for input(s): TSH, T4TOTAL, T3FREE, THYROIDAB in the  last 72 hours.  Invalid input(s): FREET3 ------------------------------------------------------------------------------------------------------------------ No results for input(s): VITAMINB12, FOLATE, FERRITIN, TIBC, IRON, RETICCTPCT in the last 72 hours.  Coagulation profile No results for input(s): INR, PROTIME in the last 168 hours.   Recent Labs  11/06/15 0256  DDIMER 3.34*    Cardiac Enzymes  Recent Labs Lab 11/06/15 0256  11/06/15 0903 11/06/15 1521  TROPONINI <0.03 <0.03 <0.03   ------------------------------------------------------------------------------------------------------------------    Component Value Date/Time   BNP 30.7 11/06/2015 0407    Inpatient Medications  Scheduled Meds: . amLODipine  5 mg Oral Daily  . apixaban  10 mg Oral BID  . [START ON 11/14/2015] apixaban  5 mg Oral BID  . budesonide (PULMICORT) nebulizer solution  0.25 mg Nebulization BID  . chlorhexidine  15 mL Mouth Rinse BID  . dextromethorphan-guaiFENesin  1 tablet Oral BID  . doxycycline  100 mg Oral Q12H  . ipratropium-albuterol  3 mL Nebulization Q4H  . methylPREDNISolone (SOLU-MEDROL) injection  60 mg Intravenous Q8H  . nicotine  21 mg Transdermal Daily   Continuous Infusions:   PRN Meds:.guaiFENesin-dextromethorphan, hydrALAZINE, ketorolac, nitroGLYCERIN, oxyCODONE-acetaminophen  Micro Results Recent Results (from the past 240 hour(s))  Respiratory Panel by PCR     Status: None   Collection Time: 11/06/15  2:14 AM  Result Value Ref Range Status   Adenovirus NOT DETECTED NOT DETECTED Final   Coronavirus 229E NOT DETECTED NOT DETECTED Final   Coronavirus HKU1 NOT DETECTED NOT DETECTED Final   Coronavirus NL63 NOT DETECTED NOT DETECTED Final   Coronavirus OC43 NOT DETECTED NOT DETECTED Final   Metapneumovirus NOT DETECTED NOT DETECTED Final   Rhinovirus / Enterovirus NOT DETECTED NOT DETECTED Final   Influenza A NOT DETECTED NOT DETECTED Final   Influenza A H1 NOT DETECTED NOT DETECTED Final   Influenza A H1 2009 NOT DETECTED NOT DETECTED Final   Influenza A H3 NOT DETECTED NOT DETECTED Final   Influenza B NOT DETECTED NOT DETECTED Final   Parainfluenza Virus 1 NOT DETECTED NOT DETECTED Final   Parainfluenza Virus 2 NOT DETECTED NOT DETECTED Final   Parainfluenza Virus 3 NOT DETECTED NOT DETECTED Final   Parainfluenza Virus 4 NOT DETECTED NOT DETECTED Final   Respiratory Syncytial Virus NOT DETECTED NOT  DETECTED Final   Bordetella pertussis NOT DETECTED NOT DETECTED Final   Chlamydophila pneumoniae NOT DETECTED NOT DETECTED Final   Mycoplasma pneumoniae NOT DETECTED NOT DETECTED Final    Comment: Performed at [facility]  Culture, blood (routine x 2) Call MD if unable to obtain prior to antibiotics being given     Status: None (Preliminary result)   Collection Time: 11/06/15  2:53 AM  Result Value Ref Range Status   Specimen Description BLOOD RIGHT HAND  Final   Special Requests IN PEDIATRIC BOTTLE 3CC  Final   Culture   Final    NO GROWTH 1 DAY Performed at [facility]    Report Status PENDING  Incomplete  MRSA PCR Screening     Status: None   Collection Time: 11/06/15  3:35 AM  Result Value Ref Range Status   MRSA by PCR NEGATIVE NEGATIVE Final    Comment:        The GeneXpert MRSA Assay (FDA approved for NASAL specimens only), is one component of a comprehensive MRSA colonization surveillance program. It is not intended to diagnose MRSA infection nor to guide or monitor treatment for MRSA infections.   Culture, blood (routine x 2) Call MD if unable to  obtain prior to antibiotics being given     Status: None (Preliminary result)   Collection Time: 11/06/15  4:07 AM  Result Value Ref Range Status   Specimen Description BLOOD LEFT HAND  Final   Special Requests BOTTLES DRAWN AEROBIC ONLY 5CC  Final   Culture   Final    NO GROWTH 1 DAY Performed at [facility]    Report Status PENDING  Incomplete    Radiology Reports Dg Chest 2 View  Result Date: 11/06/2015 CLINICAL DATA:  Wheezing and coughing for 1 week. History of asthma. Smoker. EXAM: CHEST  2 VIEW COMPARISON:  09/08/2015.  Chest CTA dated 09/08/2015. FINDINGS: Normal sized heart. Clear lungs. Thoracic spine degenerative changes. IMPRESSION: No acute abnormality. Electronically Signed   By: Claudie Revering M.D.   On: 11/06/2015 00:16   Ct Angio Chest Pe W Or Wo Contrast  Result Date:  11/06/2015 CLINICAL DATA:  shortness of breath, cough, wheezing for about 1 week, which has been progressively getting worse. He was seen by primary care physician a few days ago and was given refill of inhaler. Pt states he has used most of his inhaler over the last several days. He became acutely worse tonight and called paramedics. He can barely speak in full sentence. He also has chest pain. The chest pain is located in the left side and left flank area. It is pleuritic, constant, 8 out of 10 in severity. It is aggravated by deep breath and coughing. Patient states that he has not been taking his Eliquis for 5 days, and restarted taking it yesterday. He also has tenderness in both calf areas. EXAM: CT ANGIOGRAPHY CHEST WITH CONTRAST TECHNIQUE: Multidetector CT imaging of the chest was performed using the standard protocol during bolus administration of intravenous contrast. Multiplanar CT image reconstructions and MIPs were obtained to evaluate the vascular anatomy. CONTRAST:  100 mL Isovue 370 IV COMPARISON:  09/08/2015 FINDINGS: Vascular: Right arm IV contrast administration. SVC is patent. Right atrium and right ventricle are nondilated. Satisfactory opacification of pulmonary arteries noted, and there is no evidence of pulmonary emboli. Patent bilateral pulmonary veins drain into the left atrium. Scattered coronary calcifications. Adequate contrast opacification of the thoracic aorta with no evidence of dissection, aneurysm, or stenosis. There is classic 3-vessel brachiocephalic arch anatomy without proximal stenosis. Mediastinum/Lymph Nodes: No masses or pathologically enlarged lymph nodes identified. No pericardial effusion. Lungs/Pleura: Bronchiectasis in bilateral upper lobes and peribronchial thickening centrally scattered areas of mucous plugging without associated airspace disease. No overt interstitial edema. No pleural effusion or pneumothorax. Upper abdomen: No acute findings. Musculoskeletal:  Anterior vertebral endplate spurring at multiple levels in the mid and lower thoracic spine. Review of the MIP images confirms the above findings. IMPRESSION: 1. Negative for acute PE or thoracic aortic dissection. 2. Bronchiectasis and bronchitis with scattered areas of mucous plugging. 3. Atherosclerosis, including coronary artery disease. Please note that although the presence of coronary artery calcium documents the presence of coronary artery disease, the severity of this disease and any potential stenosis cannot be assessed on this non-gated CT examination. Assessment for potential risk factor modification, dietary therapy or pharmacologic therapy may be warranted, if clinically indicated. Electronically Signed   By: Lucrezia Europe M.D.   On: 11/06/2015 11:02    Time Spent in minutes  35   Louellen Molder M.D on 11/07/2015 at 1:54 PM  Between 7am to 7pm - Pager - [phone]  After 7pm go to [email or web address] - password [email or web address]  [facility] -  Office  [phone]

## 2015-11-08 LAB — CBC
HCT: 38.5 % — ABNORMAL LOW (ref 39.0–52.0)
Hemoglobin: 12.7 g/dL — ABNORMAL LOW (ref 13.0–17.0)
MCH: 31.1 pg (ref 26.0–34.0)
MCHC: 33 g/dL (ref 30.0–36.0)
MCV: 94.1 fL (ref 78.0–100.0)
PLATELETS: 137 10*3/uL — AB (ref 150–400)
RBC: 4.09 MIL/uL — AB (ref 4.22–5.81)
RDW: 14.6 % (ref 11.5–15.5)
WBC: 3.2 10*3/uL — ABNORMAL LOW (ref 4.0–10.5)

## 2015-11-08 MED ORDER — GUAIFENESIN-CODEINE 100-10 MG/5ML PO SOLN
10.0000 mL | ORAL | Status: DC | PRN
  Administered 2015-11-08 – 2015-11-10 (×5): 10 mL via ORAL
  Filled 2015-11-08 (×5): qty 10

## 2015-11-08 NOTE — Progress Notes (Signed)
PROGRESS NOTE                                                                                                                                                                                                             Patient Demographics:    [name], is a 55 y.o. male, DOB - 12/24/1960, [id number]  Admit date - 11/05/2015   Admitting Physician Ivor Costa, MD  Outpatient Primary MD for the patient is [name], MD  LOS - 2  Outpatient Specialists:Pulmonary  Chief Complaint  Patient presents with  . Asthma       Brief Narrative   55 year old male with history of COPD, asthma, ongoing tobacco use, hypertension, CAD status post stent, DVT, PE on [place] but noncompliant, status post IVC filter placement, HCV, diastolic CHF presented with shortness of breath with cough, wheezing and anterior chest pain. Patient reports having these symptoms for past 1 week and progressively getting worse. He also ran out of his albuterol inhaler. He also reported some left sided chest pain radiating to the left flank which is worsened with deep inspiration and coughing. He also was not been taking his anticoagulation for past 5 days. Patient denied fevers, chills, nausea, vomiting, abdominal pain, bowel or urinary symptoms.  In the ED, patient was hypoxic at 87% on room air, tachypneic, afebrile. Blood pressure was elevated to 180/99 mmHg. Labs were unremarkable. A chest x-ray was unremarkable. Patient initially required BiPAP and admitted to stepdown unit. D-dimer was elevated and was followed by a CT angiogram of the chest which was negative for PE but Doppler showed acute bilateral lower extremity DVT.    Subjective:   Still having nonproductive cough with some chest discomfort. Wheezing improved.  Assessment  & Plan :    Principal Problem:   Acute on chronic respiratory failure with hypoxia (HCC) Secondary to COPD  exacerbation.  Maintaining on room air. Less wheezy today. Continue IV Solu-Medrol, scheduled DuoNeb's and doxycycline.  Pulmicort nebs twice a day. Will add guaifenesin with codeine following nonproductive cough and pleurisy.. Counseled strongly on smoking cessation. Nicotine patch. CT angiogram negative for PE. Shows bronchiectasis and bronchitis with scattered areas of mucus plugging.   Active Problems:    History of pulmonary embolism and acute DVT D-dimer elevated. CT angiogram negative for acute PE. Per limited Doppler lower  extremity shows acute bilateral DVT.  No leg swellings or tenderness. Has history of IVC filter. Patient nonadherent to Eliquis for past few days. Resumed at a higher dose (10 mg twice a day for 7 days followed by 5 mg twice a day). counseled on medication adherence.     Tobacco abuse Counseled on cessation. nicotine patch.     CAD (coronary artery disease) History of stenting. Continue nitroglycerin when necessary      Chronic diastolic CHF (congestive heart failure) ([place]) Euvolemic.  Uncontrolled hypertension Started on amlodipine. Will increase dose. When necessary IV hydralazine.  Pancytopenia Possibly due to acute illness. Monitor closely.      Code Status : Full code  Family Communication  : None at bedside  Disposition Plan  :Possible in the next 40-72 hours.  Barriers For Discharge : Very slow to recovery  Consults  :  None  Procedures  :  CT angiogram of the chest Doppler lower extremity  DVT Prophylaxis  :  Eliquis  Lab Results  Component Value Date   PLT 137 (L) 11/08/2015    Antibiotics  :   Anti-infectives    Start     Dose/Rate Route Frequency Ordered Stop   11/06/15 0230  doxycycline (VIBRA-TABS) tablet 100 mg     100 mg Oral Every 12 hours 11/06/15 0213          Objective:   Vitals:   11/07/15 2106 11/07/15 2316 11/08/15 0300 11/08/15 0543  BP: (!) 145/85   140/87  Pulse: 76   65  Resp: 18   18  Temp:  97.7 F (36.5 C)   98.3 F (36.8 C)  TempSrc: Oral   Oral  SpO2: 94% 97% 96% 95%  Weight:      Height:        Wt Readings from Last 3 Encounters:  11/06/15 77.1 kg (169 lb 15.6 oz)  09/09/15 78.4 kg (172 lb 12.8 oz)  06/04/15 72.8 kg (160 lb 9.6 oz)     Intake/Output Summary (Last 24 hours) at 11/08/15 1250 Last data filed at 11/08/15 0900  Gross per 24 hour  Intake              840 ml  Output                0 ml  Net              840 ml     Physical Exam  Gen: not in distress HEENT: moist mucosa, supple neck Chest: Bilateral diffuse wheezing(Improved from yesterday) CVS: N S1&S2, no murmurs,  GI: soft, NT, ND, BS+ Musculoskeletal: warm, no edema     Data Review:    CBC  Recent Labs Lab 11/05/15 2350 11/07/15 0447 11/08/15 0815  WBC 4.8 2.9* 3.2*  HGB 13.9 12.5* 12.7*  HCT 41.5 37.1* 38.5*  PLT 131* 112* 137*  MCV 93.5 92.5 94.1  MCH 31.3 31.2 31.1  MCHC 33.5 33.7 33.0  RDW 14.7 14.7 14.6  LYMPHSABS 1.4  --   --   MONOABS 0.6  --   --   EOSABS 0.7  --   --   BASOSABS 0.0  --   --     Chemistries   Recent Labs Lab 11/05/15 2350  NA 139  K 4.1  CL 109  CO2 23  GLUCOSE 100*  BUN 12  CREATININE 0.93  CALCIUM 9.2   ------------------------------------------------------------------------------------------------------------------ No results for input(s): CHOL, HDL, LDLCALC, TRIG, CHOLHDL, LDLDIRECT in the  last 72 hours.  Lab Results  Component Value Date   HGBA1C 5.6 04/27/2014   ------------------------------------------------------------------------------------------------------------------ No results for input(s): TSH, T4TOTAL, T3FREE, THYROIDAB in the last 72 hours.  Invalid input(s): FREET3 ------------------------------------------------------------------------------------------------------------------ No results for input(s): VITAMINB12, FOLATE, FERRITIN, TIBC, IRON, RETICCTPCT in the last 72 hours.  Coagulation profile No  results for input(s): INR, PROTIME in the last 168 hours.   Recent Labs  11/06/15 0256  DDIMER 3.34*    Cardiac Enzymes  Recent Labs Lab 11/06/15 0256 11/06/15 0903 11/06/15 1521  TROPONINI <0.03 <0.03 <0.03   ------------------------------------------------------------------------------------------------------------------    Component Value Date/Time   BNP 30.7 11/06/2015 0407    Inpatient Medications  Scheduled Meds: . amLODipine  5 mg Oral Daily  . apixaban  10 mg Oral BID  . [START ON 11/14/2015] apixaban  5 mg Oral BID  . budesonide (PULMICORT) nebulizer solution  0.25 mg Nebulization BID  . chlorhexidine  15 mL Mouth Rinse BID  . dextromethorphan-guaiFENesin  1 tablet Oral BID  . doxycycline  100 mg Oral Q12H  . ipratropium-albuterol  3 mL Nebulization Q4H  . methylPREDNISolone (SOLU-MEDROL) injection  60 mg Intravenous Q8H  . nicotine  21 mg Transdermal Daily   Continuous Infusions:   PRN Meds:.guaiFENesin-codeine, hydrALAZINE, ketorolac, nitroGLYCERIN, oxyCODONE-acetaminophen  Micro Results Recent Results (from the past 240 hour(s))  Respiratory Panel by PCR     Status: None   Collection Time: 11/06/15  2:14 AM  Result Value Ref Range Status   Adenovirus NOT DETECTED NOT DETECTED Final   Coronavirus 229E NOT DETECTED NOT DETECTED Final   Coronavirus HKU1 NOT DETECTED NOT DETECTED Final   Coronavirus NL63 NOT DETECTED NOT DETECTED Final   Coronavirus OC43 NOT DETECTED NOT DETECTED Final   Metapneumovirus NOT DETECTED NOT DETECTED Final   Rhinovirus / Enterovirus NOT DETECTED NOT DETECTED Final   Influenza A NOT DETECTED NOT DETECTED Final   Influenza A H1 NOT DETECTED NOT DETECTED Final   Influenza A H1 2009 NOT DETECTED NOT DETECTED Final   Influenza A H3 NOT DETECTED NOT DETECTED Final   Influenza B NOT DETECTED NOT DETECTED Final   Parainfluenza Virus 1 NOT DETECTED NOT DETECTED Final   Parainfluenza Virus 2 NOT DETECTED NOT DETECTED Final    Parainfluenza Virus 3 NOT DETECTED NOT DETECTED Final   Parainfluenza Virus 4 NOT DETECTED NOT DETECTED Final   Respiratory Syncytial Virus NOT DETECTED NOT DETECTED Final   Bordetella pertussis NOT DETECTED NOT DETECTED Final   Chlamydophila pneumoniae NOT DETECTED NOT DETECTED Final   Mycoplasma pneumoniae NOT DETECTED NOT DETECTED Final    Comment: Performed at [facility]  Culture, blood (routine x 2) Call MD if unable to obtain prior to antibiotics being given     Status: None (Preliminary result)   Collection Time: 11/06/15  2:53 AM  Result Value Ref Range Status   Specimen Description BLOOD RIGHT HAND  Final   Special Requests IN PEDIATRIC BOTTLE 3CC  Final   Culture   Final    NO GROWTH 1 DAY Performed at [facility]    Report Status PENDING  Incomplete  MRSA PCR Screening     Status: None   Collection Time: 11/06/15  3:35 AM  Result Value Ref Range Status   MRSA by PCR NEGATIVE NEGATIVE Final    Comment:        The GeneXpert MRSA Assay (FDA approved for NASAL specimens only), is one component of a comprehensive MRSA colonization surveillance program. It  is not intended to diagnose MRSA infection nor to guide or monitor treatment for MRSA infections.   Culture, blood (routine x 2) Call MD if unable to obtain prior to antibiotics being given     Status: None (Preliminary result)   Collection Time: 11/06/15  4:07 AM  Result Value Ref Range Status   Specimen Description BLOOD LEFT HAND  Final   Special Requests BOTTLES DRAWN AEROBIC ONLY 5CC  Final   Culture   Final    NO GROWTH 1 DAY Performed at [facility]    Report Status PENDING  Incomplete    Radiology Reports Dg Chest 2 View  Result Date: 11/06/2015 CLINICAL DATA:  Wheezing and coughing for 1 week. History of asthma. Smoker. EXAM: CHEST  2 VIEW COMPARISON:  09/08/2015.  Chest CTA dated 09/08/2015. FINDINGS: Normal sized heart. Clear lungs. Thoracic spine degenerative changes.  IMPRESSION: No acute abnormality. Electronically Signed   By: Claudie Revering M.D.   On: 11/06/2015 00:16   Ct Angio Chest Pe W Or Wo Contrast  Result Date: 11/06/2015 CLINICAL DATA:  shortness of breath, cough, wheezing for about 1 week, which has been progressively getting worse. He was seen by primary care physician a few days ago and was given refill of inhaler. Pt states he has used most of his inhaler over the last several days. He became acutely worse tonight and called paramedics. He can barely speak in full sentence. He also has chest pain. The chest pain is located in the left side and left flank area. It is pleuritic, constant, 8 out of 10 in severity. It is aggravated by deep breath and coughing. Patient states that he has not been taking his Eliquis for 5 days, and restarted taking it yesterday. He also has tenderness in both calf areas. EXAM: CT ANGIOGRAPHY CHEST WITH CONTRAST TECHNIQUE: Multidetector CT imaging of the chest was performed using the standard protocol during bolus administration of intravenous contrast. Multiplanar CT image reconstructions and MIPs were obtained to evaluate the vascular anatomy. CONTRAST:  100 mL Isovue 370 IV COMPARISON:  09/08/2015 FINDINGS: Vascular: Right arm IV contrast administration. SVC is patent. Right atrium and right ventricle are nondilated. Satisfactory opacification of pulmonary arteries noted, and there is no evidence of pulmonary emboli. Patent bilateral pulmonary veins drain into the left atrium. Scattered coronary calcifications. Adequate contrast opacification of the thoracic aorta with no evidence of dissection, aneurysm, or stenosis. There is classic 3-vessel brachiocephalic arch anatomy without proximal stenosis. Mediastinum/Lymph Nodes: No masses or pathologically enlarged lymph nodes identified. No pericardial effusion. Lungs/Pleura: Bronchiectasis in bilateral upper lobes and peribronchial thickening centrally scattered areas of mucous plugging  without associated airspace disease. No overt interstitial edema. No pleural effusion or pneumothorax. Upper abdomen: No acute findings. Musculoskeletal: Anterior vertebral endplate spurring at multiple levels in the mid and lower thoracic spine. Review of the MIP images confirms the above findings. IMPRESSION: 1. Negative for acute PE or thoracic aortic dissection. 2. Bronchiectasis and bronchitis with scattered areas of mucous plugging. 3. Atherosclerosis, including coronary artery disease. Please note that although the presence of coronary artery calcium documents the presence of coronary artery disease, the severity of this disease and any potential stenosis cannot be assessed on this non-gated CT examination. Assessment for potential risk factor modification, dietary therapy or pharmacologic therapy may be warranted, if clinically indicated. Electronically Signed   By: Lucrezia Europe M.D.   On: 11/06/2015 11:02    Time Spent in minutes  35   Shedric Fredericks,  Kaevon Cotta M.D on 11/08/2015 at 12:50 PM  Between 7am to 7pm - Pager - [phone]  After 7pm go to [email or web address] - password [facility]  [facility] -  Office  [phone]

## 2015-11-09 LAB — BASIC METABOLIC PANEL
ANION GAP: 6 (ref 5–15)
BUN: 27 mg/dL — ABNORMAL HIGH (ref 6–20)
CALCIUM: 8.5 mg/dL — AB (ref 8.9–10.3)
CO2: 23 mmol/L (ref 22–32)
CREATININE: 0.81 mg/dL (ref 0.61–1.24)
Chloride: 105 mmol/L (ref 101–111)
GFR calc Af Amer: >60 mL/min (ref >60)
GLUCOSE: 144 mg/dL — AB (ref 65–99)
Potassium: 4.2 mmol/L (ref 3.5–5.1)
Sodium: 134 mmol/L — ABNORMAL LOW (ref 135–145)

## 2015-11-09 LAB — CBC
HCT: 36.1 % — ABNORMAL LOW (ref 39.0–52.0)
Hemoglobin: 12 g/dL — ABNORMAL LOW (ref 13.0–17.0)
MCH: 31.3 pg (ref 26.0–34.0)
MCHC: 33.2 g/dL (ref 30.0–36.0)
MCV: 94 fL (ref 78.0–100.0)
PLATELETS: 124 10*3/uL — AB (ref 150–400)
RBC: 3.84 MIL/uL — ABNORMAL LOW (ref 4.22–5.81)
RDW: 14.7 % (ref 11.5–15.5)
WBC: 2.5 10*3/uL — AB (ref 4.0–10.5)

## 2015-11-09 LAB — MAGNESIUM: Magnesium: 2 mg/dL (ref 1.7–2.4)

## 2015-11-09 MED ORDER — PREDNISONE 50 MG PO TABS
50.0000 mg | ORAL_TABLET | Freq: Every day | ORAL | Status: DC
  Administered 2015-11-10: 50 mg via ORAL
  Filled 2015-11-09: qty 1

## 2015-11-09 MED ORDER — PANTOPRAZOLE SODIUM 40 MG PO TBEC
40.0000 mg | DELAYED_RELEASE_TABLET | Freq: Every day | ORAL | Status: DC
  Administered 2015-11-09 – 2015-11-10 (×2): 40 mg via ORAL
  Filled 2015-11-09 (×2): qty 1

## 2015-11-09 MED ORDER — SENNOSIDES-DOCUSATE SODIUM 8.6-50 MG PO TABS
1.0000 | ORAL_TABLET | Freq: Two times a day (BID) | ORAL | Status: DC
  Administered 2015-11-09 – 2015-11-10 (×3): 1 via ORAL
  Filled 2015-11-09 (×3): qty 1

## 2015-11-09 NOTE — Progress Notes (Signed)
PROGRESS NOTE                                                                                                                                                                                                             Patient Demographics:    [name], is a 55 y.o. male, DOB - [birth year], [id number]  Admit date - 11/05/2015   Admitting Physician Ivor Costa, MD  Outpatient Primary MD for the patient is [name], MD  LOS - 3  Outpatient Specialists:Pulmonary  Chief Complaint  Patient presents with  . Asthma       Brief Narrative   55 year old male with history of COPD, asthma, ongoing tobacco use, hypertension, CAD status post stent, DVT, PE on [place] but noncompliant, status post IVC filter placement, HCV, diastolic CHF presented with shortness of breath with cough, wheezing and anterior chest pain. Patient reports having these symptoms for past 1 week and progressively getting worse. He also ran out of his albuterol inhaler. He also reported some left sided chest pain radiating to the left flank which is worsened with deep inspiration and coughing. He also was not been taking his anticoagulation for past 5 days. Patient denied fevers, chills, nausea, vomiting, abdominal pain, bowel or urinary symptoms.  In the ED, patient was hypoxic at 87% on room air, tachypneic, afebrile. Blood pressure was elevated to 180/99 mmHg. Labs were unremarkable. A chest x-ray was unremarkable. Patient initially required BiPAP and admitted to stepdown unit. D-dimer was elevated and was followed by a CT angiogram of the chest which was negative for PE but Doppler showed acute bilateral lower extremity DVT.    Subjective:   Cough and wheezing slightly better today.  Assessment  & Plan :    Principal Problem:   Acute on chronic respiratory failure with hypoxia (HCC) Secondary to COPD exacerbation.  -Symptoms improving.  Transition to oral prednisone. scheduled DuoNeb's and doxycycline.  Pulmicort nebs twice a day. -Scheduled and when necessary antitussives... Counseled strongly on smoking cessation. Nicotine patch.   Active Problems:    History of pulmonary embolism and acute DVT D-dimer elevated. CT angiogram negative for acute PE.  Doppler lower extremity shows acute bilateral DVT.  No leg swellings or tenderness. Has history of IVC filter. Patient nonadherent to Eliquis for past few days.  Resumed at a higher  dose (10 mg twice a day for 7 days followed by 5 mg twice a day). counseled on medication adherence.     Tobacco abuse Counseled on cessation. Diffuse nicotine patch.     CAD (coronary artery disease) History of stenting. Continue nitroglycerin when necessary      Chronic diastolic CHF (congestive heart failure) ([place]) Euvolemic.  Uncontrolled hypertension Started on amlodipine, blood pressure stable. When necessary IV hydralazine.  Pancytopenia ? Acute viral illness. Monitor closely.      Code Status : Full code  Family Communication  : None at bedside  Disposition Plan  : Home possibly tomorrow if continues to improve.  Barriers For Discharge : Very slow to recovery  Consults  :  None  Procedures  :  CT angiogram of the chest Doppler lower extremity  DVT Prophylaxis  :  Eliquis  Lab Results  Component Value Date   PLT 124 (L) 11/09/2015    Antibiotics  :   Anti-infectives    Start     Dose/Rate Route Frequency Ordered Stop   11/06/15 0230  doxycycline (VIBRA-TABS) tablet 100 mg     100 mg Oral Every 12 hours 11/06/15 0213          Objective:   Vitals:   11/09/15 0551 11/09/15 0814 11/09/15 0825 11/09/15 1329  BP: 127/73     Pulse: 70     Resp: 18     Temp: 98.1 F (36.7 C)     TempSrc: Oral     SpO2: 97% 96% 96% 98%  Weight:      Height:        Wt Readings from Last 3 Encounters:  11/06/15 77.1 kg (169 lb 15.6 oz)  09/09/15 78.4 kg (172 lb 12.8  oz)  06/04/15 72.8 kg (160 lb 9.6 oz)     Intake/Output Summary (Last 24 hours) at 11/09/15 1335 Last data filed at 11/09/15 0900  Gross per 24 hour  Intake              720 ml  Output              850 ml  Net             -130 ml     Physical Exam  Gen: not in distress HEENT: moist mucosa, supple neck Chest: Bilateral scattered wheezing (improved) CVS: N S1&S2, no murmurs,  GI: soft, NT, ND, BS+ Musculoskeletal: warm, no edema     Data Review:    CBC  Recent Labs Lab 11/05/15 2350 11/07/15 0447 11/08/15 0815 11/09/15 0506  WBC 4.8 2.9* 3.2* 2.5*  HGB 13.9 12.5* 12.7* 12.0*  HCT 41.5 37.1* 38.5* 36.1*  PLT 131* 112* 137* 124*  MCV 93.5 92.5 94.1 94.0  MCH 31.3 31.2 31.1 31.3  MCHC 33.5 33.7 33.0 33.2  RDW 14.7 14.7 14.6 14.7  LYMPHSABS 1.4  --   --   --   MONOABS 0.6  --   --   --   EOSABS 0.7  --   --   --   BASOSABS 0.0  --   --   --     Chemistries   Recent Labs Lab 11/05/15 2350 11/09/15 0506  NA 139 134*  K 4.1 4.2  CL 109 105  CO2 23 23  GLUCOSE 100* 144*  BUN 12 27*  CREATININE 0.93 0.81  CALCIUM 9.2 8.5*  MG  --  2.0   ------------------------------------------------------------------------------------------------------------------ No results for input(s): CHOL, HDL, LDLCALC, TRIG,  CHOLHDL, LDLDIRECT in the last 72 hours.  Lab Results  Component Value Date   HGBA1C 5.6 04/27/2014   ------------------------------------------------------------------------------------------------------------------ No results for input(s): TSH, T4TOTAL, T3FREE, THYROIDAB in the last 72 hours.  Invalid input(s): FREET3 ------------------------------------------------------------------------------------------------------------------ No results for input(s): VITAMINB12, FOLATE, FERRITIN, TIBC, IRON, RETICCTPCT in the last 72 hours.  Coagulation profile No results for input(s): INR, PROTIME in the last 168 hours.  No results for input(s): DDIMER in the  last 72 hours.  Cardiac Enzymes  Recent Labs Lab 11/06/15 0256 11/06/15 0903 11/06/15 1521  TROPONINI <0.03 <0.03 <0.03   ------------------------------------------------------------------------------------------------------------------    Component Value Date/Time   BNP 30.7 11/06/2015 0407    Inpatient Medications  Scheduled Meds: . amLODipine  5 mg Oral Daily  . apixaban  10 mg Oral BID  . [START ON 11/14/2015] apixaban  5 mg Oral BID  . budesonide (PULMICORT) nebulizer solution  0.25 mg Nebulization BID  . chlorhexidine  15 mL Mouth Rinse BID  . dextromethorphan-guaiFENesin  1 tablet Oral BID  . doxycycline  100 mg Oral Q12H  . ipratropium-albuterol  3 mL Nebulization Q4H  . nicotine  21 mg Transdermal Daily  . pantoprazole  40 mg Oral Daily  . [START ON 11/10/2015] predniSONE  50 mg Oral Q breakfast  . senna-docusate  1 tablet Oral BID   Continuous Infusions:   PRN Meds:.guaiFENesin-codeine, hydrALAZINE, ketorolac, nitroGLYCERIN, oxyCODONE-acetaminophen  Micro Results Recent Results (from the past 240 hour(s))  Respiratory Panel by PCR     Status: None   Collection Time: 11/06/15  2:14 AM  Result Value Ref Range Status   Adenovirus NOT DETECTED NOT DETECTED Final   Coronavirus 229E NOT DETECTED NOT DETECTED Final   Coronavirus HKU1 NOT DETECTED NOT DETECTED Final   Coronavirus NL63 NOT DETECTED NOT DETECTED Final   Coronavirus OC43 NOT DETECTED NOT DETECTED Final   Metapneumovirus NOT DETECTED NOT DETECTED Final   Rhinovirus / Enterovirus NOT DETECTED NOT DETECTED Final   Influenza A NOT DETECTED NOT DETECTED Final   Influenza A H1 NOT DETECTED NOT DETECTED Final   Influenza A H1 2009 NOT DETECTED NOT DETECTED Final   Influenza A H3 NOT DETECTED NOT DETECTED Final   Influenza B NOT DETECTED NOT DETECTED Final   Parainfluenza Virus 1 NOT DETECTED NOT DETECTED Final   Parainfluenza Virus 2 NOT DETECTED NOT DETECTED Final   Parainfluenza Virus 3 NOT DETECTED  NOT DETECTED Final   Parainfluenza Virus 4 NOT DETECTED NOT DETECTED Final   Respiratory Syncytial Virus NOT DETECTED NOT DETECTED Final   Bordetella pertussis NOT DETECTED NOT DETECTED Final   Chlamydophila pneumoniae NOT DETECTED NOT DETECTED Final   Mycoplasma pneumoniae NOT DETECTED NOT DETECTED Final    Comment: Performed at [facility]  Culture, blood (routine x 2) Call MD if unable to obtain prior to antibiotics being given     Status: None (Preliminary result)   Collection Time: 11/06/15  2:53 AM  Result Value Ref Range Status   Specimen Description BLOOD RIGHT HAND  Final   Special Requests IN PEDIATRIC BOTTLE 3CC  Final   Culture   Final    NO GROWTH 2 DAYS Performed at [facility]    Report Status PENDING  Incomplete  MRSA PCR Screening     Status: None   Collection Time: 11/06/15  3:35 AM  Result Value Ref Range Status   MRSA by PCR NEGATIVE NEGATIVE Final    Comment:  The GeneXpert MRSA Assay (FDA approved for NASAL specimens only), is one component of a comprehensive MRSA colonization surveillance program. It is not intended to diagnose MRSA infection nor to guide or monitor treatment for MRSA infections.   Culture, blood (routine x 2) Call MD if unable to obtain prior to antibiotics being given     Status: None (Preliminary result)   Collection Time: 11/06/15  4:07 AM  Result Value Ref Range Status   Specimen Description BLOOD LEFT HAND  Final   Special Requests BOTTLES DRAWN AEROBIC ONLY 5CC  Final   Culture   Final    NO GROWTH 2 DAYS Performed at [facility]    Report Status PENDING  Incomplete    Radiology Reports Dg Chest 2 View  Result Date: 11/06/2015 CLINICAL DATA:  Wheezing and coughing for 1 week. History of asthma. Smoker. EXAM: CHEST  2 VIEW COMPARISON:  09/08/2015.  Chest CTA dated 09/08/2015. FINDINGS: Normal sized heart. Clear lungs. Thoracic spine degenerative changes. IMPRESSION: No acute abnormality.  Electronically Signed   By: Claudie Revering M.D.   On: 11/06/2015 00:16   Ct Angio Chest Pe W Or Wo Contrast  Result Date: 11/06/2015 CLINICAL DATA:  shortness of breath, cough, wheezing for about 1 week, which has been progressively getting worse. He was seen by primary care physician a few days ago and was given refill of inhaler. Pt states he has used most of his inhaler over the last several days. He became acutely worse tonight and called paramedics. He can barely speak in full sentence. He also has chest pain. The chest pain is located in the left side and left flank area. It is pleuritic, constant, 8 out of 10 in severity. It is aggravated by deep breath and coughing. Patient states that he has not been taking his Eliquis for 5 days, and restarted taking it yesterday. He also has tenderness in both calf areas. EXAM: CT ANGIOGRAPHY CHEST WITH CONTRAST TECHNIQUE: Multidetector CT imaging of the chest was performed using the standard protocol during bolus administration of intravenous contrast. Multiplanar CT image reconstructions and MIPs were obtained to evaluate the vascular anatomy. CONTRAST:  100 mL Isovue 370 IV COMPARISON:  09/08/2015 FINDINGS: Vascular: Right arm IV contrast administration. SVC is patent. Right atrium and right ventricle are nondilated. Satisfactory opacification of pulmonary arteries noted, and there is no evidence of pulmonary emboli. Patent bilateral pulmonary veins drain into the left atrium. Scattered coronary calcifications. Adequate contrast opacification of the thoracic aorta with no evidence of dissection, aneurysm, or stenosis. There is classic 3-vessel brachiocephalic arch anatomy without proximal stenosis. Mediastinum/Lymph Nodes: No masses or pathologically enlarged lymph nodes identified. No pericardial effusion. Lungs/Pleura: Bronchiectasis in bilateral upper lobes and peribronchial thickening centrally scattered areas of mucous plugging without associated airspace  disease. No overt interstitial edema. No pleural effusion or pneumothorax. Upper abdomen: No acute findings. Musculoskeletal: Anterior vertebral endplate spurring at multiple levels in the mid and lower thoracic spine. Review of the MIP images confirms the above findings. IMPRESSION: 1. Negative for acute PE or thoracic aortic dissection. 2. Bronchiectasis and bronchitis with scattered areas of mucous plugging. 3. Atherosclerosis, including coronary artery disease. Please note that although the presence of coronary artery calcium documents the presence of coronary artery disease, the severity of this disease and any potential stenosis cannot be assessed on this non-gated CT examination. Assessment for potential risk factor modification, dietary therapy or pharmacologic therapy may be warranted, if clinically indicated. Electronically Signed   By:  Lucrezia Europe M.D.   On: 11/06/2015 11:02    Time Spent in minutes  25   Louellen Molder M.D on 11/09/2015 at 1:35 PM  Between 7am to 7pm - Pager - [phone]  After 7pm go to [email or web address] - password [facility]  [facility] -  Office  [phone]

## 2015-11-09 NOTE — Progress Notes (Addendum)
SATURATION QUALIFICATIONS: (This note is used to comply with regulatory documentation for home oxygen)  Patient Saturations on Room Air at Rest 100%  Patient Saturations on Room Air while Ambulating 95%  Patient Saturations on N/A Liters of oxygen while Ambulating   Please briefly explain why patient needs home oxygen:   Pt stated he feels SOB when ambulating even though it not reflective in O2 sats. Pt wheezing and coughing with activity and states feel tire.

## 2015-11-10 DIAGNOSIS — J9621 Acute and chronic respiratory failure with hypoxia: Secondary | ICD-10-CM

## 2015-11-10 LAB — CBC
HCT: 37.6 % — ABNORMAL LOW (ref 39.0–52.0)
HEMOGLOBIN: 12.2 g/dL — AB (ref 13.0–17.0)
MCH: 30.3 pg (ref 26.0–34.0)
MCHC: 32.4 g/dL (ref 30.0–36.0)
MCV: 93.3 fL (ref 78.0–100.0)
PLATELETS: 130 10*3/uL — AB (ref 150–400)
RBC: 4.03 MIL/uL — ABNORMAL LOW (ref 4.22–5.81)
RDW: 14.7 % (ref 11.5–15.5)
WBC: 3 10*3/uL — ABNORMAL LOW (ref 4.0–10.5)

## 2015-11-10 MED ORDER — PREDNISONE 10 MG PO TABS: ORAL_TABLET | ORAL | 0 refills | Status: DC

## 2015-11-10 MED ORDER — PANTOPRAZOLE SODIUM 40 MG PO TBEC: 40.0000 mg | DELAYED_RELEASE_TABLET | Freq: Every day | ORAL | 0 refills | Status: DC

## 2015-11-10 MED ORDER — AMLODIPINE BESYLATE 5 MG PO TABS: 5.0000 mg | ORAL_TABLET | Freq: Every day | ORAL | 1 refills | Status: DC

## 2015-11-10 MED ORDER — BUDESONIDE-FORMOTEROL FUMARATE 160-4.5 MCG/ACT IN AERO: 2.0000 | INHALATION_SPRAY | Freq: Two times a day (BID) | RESPIRATORY_TRACT | 3 refills | Status: DC

## 2015-11-10 MED ORDER — DOXYCYCLINE HYCLATE 100 MG PO TABS: 100.0000 mg | ORAL_TABLET | Freq: Two times a day (BID) | ORAL | 0 refills | Status: DC

## 2015-11-10 MED ORDER — IPRATROPIUM-ALBUTEROL 0.5-2.5 (3) MG/3ML IN SOLN: 3.0000 mL | RESPIRATORY_TRACT | 3 refills | Status: DC | PRN

## 2015-11-10 MED ORDER — DM-GUAIFENESIN ER 30-600 MG PO TB12: 1.0000 | ORAL_TABLET | Freq: Two times a day (BID) | ORAL | 0 refills | Status: DC

## 2015-11-10 MED ORDER — ALBUTEROL SULFATE HFA 108 (90 BASE) MCG/ACT IN AERS: 2.0000 | INHALATION_SPRAY | Freq: Four times a day (QID) | RESPIRATORY_TRACT | 5 refills | Status: DC | PRN

## 2015-11-10 MED ORDER — APIXABAN 5 MG PO TABS: 10.0000 mg | ORAL_TABLET | Freq: Two times a day (BID) | ORAL | 0 refills | Status: DC

## 2015-11-10 MED ORDER — OXYCODONE-ACETAMINOPHEN 5-325 MG PO TABS: 1.0000 | ORAL_TABLET | Freq: Three times a day (TID) | ORAL | 0 refills | Status: DC | PRN

## 2015-11-10 NOTE — Discharge Summary (Signed)
Physician Discharge Summary  [name] [id number] DOB: [birth year] DOA: 11/05/2015  PCP: [name], MD  Admit date: 11/05/2015 Discharge date: 11/10/2015  Recommendations for Outpatient Follow-up:  1. Pt will need to follow up with PCP in 2-3 weeks post discharge 2. Please obtain BMP to evaluate electrolytes and kidney function 3. Please also check CBC to evaluate Hg and Hct levels 4. Continue Eliquis 5 mg BID PO   Discharge Diagnoses:  Principal Problem:   Acute on chronic respiratory failure with hypoxia (HCC) Active Problems:   Asthma exacerbation   History of pulmonary embolism   Tobacco abuse   History of DVT (deep vein thrombosis)   CAD (coronary artery disease)   Acute exacerbation of COPD with asthma (HCC)   Essential hypertension   Chronic diastolic CHF (congestive heart failure) (HCC)   Pulmonary embolism (HCC)  Discharge Condition: Stable  Diet recommendation: Heart healthy diet discussed in details   Brief Narrative   55 year old male with history of COPD, asthma, ongoing tobacco use, hypertension, CAD status post stent, DVT, PE on Ellik [place] but noncompliant, status post IVC filter placement, HCV, diastolic CHF presented with shortness of breath with cough, wheezing and anterior chest pain.  Patient reports having these symptoms for past 1 week and progressively getting worse. He also ran out of his albuterol inhaler. He also reported some left sided chest pain radiating to the left flank which is worsened with deep inspiration and coughing. He also was not been taking his anticoagulation for past 5 days. Patient denied fevers, chills, nausea, vomiting, abdominal pain, bowel or urinary symptoms.  In the ED, patient was hypoxic at 87% on room air, tachypneic, afebrile. Blood pressure was elevated to 180/99 mmHg. Labs were unremarkable. A chest x-ray was unremarkable. Patient initially required BiPAP and admitted to stepdown unit.  D-dimer was elevated  and was followed by a CT angiogram of the chest which was negative for PE but Doppler showed acute bilateral lower extremity DVT.   Subjective:   Feels better today.   Assessment  & Plan :    Principal Problem:   Acute on chronic respiratory failure with hypoxia (HCC) Secondary to COPD exacerbation.  -Symptoms improving. Transitioned to oral prednisone. DuoNeb's and doxycycline.  Pulmicort nebs twice a day.  Active Problems:    History of pulmonary embolism and acute DVT D-dimer elevated. CT angiogram negative for acute PE.  Doppler lower extremity shows acute bilateral DVT.  No leg swellings or tenderness. Has history of IVC filter. Patient nonadherent to Eliquis for past few days.  Resumed Eliquis at home dose, pt did not want to take more medication. Initially called in 10 mg BID PO but changed to 5 mg PO BID.      Tobacco abuse Counseled on cessation.     CAD (coronary artery disease) History of stenting. Continue nitroglycerin when necessary    Chronic diastolic CHF (congestive heart failure) ([place]) Euvolemic.  Uncontrolled hypertension Started on amlodipine, blood pressure stable.  Pancytopenia ? Acute viral illness. Improved    Code Status : Full code  Family Communication  : None at bedside  Disposition Plan  : Home    Procedures/Studies: Dg Chest 2 View  Result Date: 11/06/2015 CLINICAL DATA:  Wheezing and coughing for 1 week. History of asthma. Smoker. EXAM: CHEST  2 VIEW COMPARISON:  09/08/2015.  Chest CTA dated 09/08/2015. FINDINGS: Normal sized heart. Clear lungs. Thoracic spine degenerative changes. IMPRESSION: No acute abnormality. Electronically Signed   By: Remo Lipps  Joneen Caraway M.D.   On: 11/06/2015 00:16   Ct Angio Chest Pe W Or Wo Contrast  Result Date: 11/06/2015 CLINICAL DATA:  shortness of breath, cough, wheezing for about 1 week, which has been progressively getting worse. He was seen by primary care physician a few days ago and was given  refill of inhaler. Pt states he has used most of his inhaler over the last several days. He became acutely worse tonight and called paramedics. He can barely speak in full sentence. He also has chest pain. The chest pain is located in the left side and left flank area. It is pleuritic, constant, 8 out of 10 in severity. It is aggravated by deep breath and coughing. Patient states that he has not been taking his Eliquis for 5 days, and restarted taking it yesterday. He also has tenderness in both calf areas. EXAM: CT ANGIOGRAPHY CHEST WITH CONTRAST TECHNIQUE: Multidetector CT imaging of the chest was performed using the standard protocol during bolus administration of intravenous contrast. Multiplanar CT image reconstructions and MIPs were obtained to evaluate the vascular anatomy. CONTRAST:  100 mL Isovue 370 IV COMPARISON:  09/08/2015 FINDINGS: Vascular: Right arm IV contrast administration. SVC is patent. Right atrium and right ventricle are nondilated. Satisfactory opacification of pulmonary arteries noted, and there is no evidence of pulmonary emboli. Patent bilateral pulmonary veins drain into the left atrium. Scattered coronary calcifications. Adequate contrast opacification of the thoracic aorta with no evidence of dissection, aneurysm, or stenosis. There is classic 3-vessel brachiocephalic arch anatomy without proximal stenosis. Mediastinum/Lymph Nodes: No masses or pathologically enlarged lymph nodes identified. No pericardial effusion. Lungs/Pleura: Bronchiectasis in bilateral upper lobes and peribronchial thickening centrally scattered areas of mucous plugging without associated airspace disease. No overt interstitial edema. No pleural effusion or pneumothorax. Upper abdomen: No acute findings. Musculoskeletal: Anterior vertebral endplate spurring at multiple levels in the mid and lower thoracic spine. Review of the MIP images confirms the above findings. IMPRESSION: 1. Negative for acute PE or thoracic  aortic dissection. 2. Bronchiectasis and bronchitis with scattered areas of mucous plugging. 3. Atherosclerosis, including coronary artery disease. Please note that although the presence of coronary artery calcium documents the presence of coronary artery disease, the severity of this disease and any potential stenosis cannot be assessed on this non-gated CT examination. Assessment for potential risk factor modification, dietary therapy or pharmacologic therapy may be warranted, if clinically indicated. Electronically Signed   By: Lucrezia Europe M.D.   On: 11/06/2015 11:02     Discharge Exam: Vitals:   11/10/15 0756 11/10/15 1130  BP:    Pulse: 71 86  Resp: 18 19  Temp:     Vitals:   11/10/15 0515 11/10/15 0756 11/10/15 1125 11/10/15 1130  BP: 133/85     Pulse: 63 71  86  Resp: 18 18  19   Temp: 98.4 F (36.9 C)     TempSrc: Oral     SpO2: 98% 97% 96%   Weight:      Height:        General: Pt is alert, follows commands appropriately, not in acute distress Cardiovascular: Regular rate and rhythm, S1/S2 +, no murmurs, no rubs, no gallops Respiratory: Clear to auscultation bilaterally, no wheezing, no crackles, no rhonchi Abdominal: Soft, non tender, non distended, bowel sounds +, no guarding   Discharge Instructions  Discharge Instructions    Diet - low sodium heart healthy    Complete by:  As directed   Increase activity slowly  Complete by:  As directed       Medication List    STOP taking these medications   traMADol 50 MG tablet Commonly known as:  ULTRAM     TAKE these medications   albuterol 108 (90 Base) MCG/ACT inhaler Commonly known as:  PROVENTIL HFA;VENTOLIN HFA Inhale 2 puffs into the lungs every 6 (six) hours as needed for wheezing or shortness of breath.   amLODipine 5 MG tablet Commonly known as:  NORVASC Take 1 tablet (5 mg total) by mouth daily.   apixaban 5 MG Tabs tablet Commonly known as:  ELIQUIS Take 1 tablet1 by mouth 2 (two) times daily.    budesonide-formoterol 160-4.5 MCG/ACT inhaler Commonly known as:  SYMBICORT Inhale 2 puffs into the lungs 2 (two) times daily.   dextromethorphan-guaiFENesin 30-600 MG 12hr tablet Commonly known as:  MUCINEX DM Take 1 tablet by mouth 2 (two) times daily.   doxycycline 100 MG tablet Commonly known as:  VIBRA-TABS Take 1 tablet (100 mg total) by mouth every 12 (twelve) hours.   fluticasone 50 MCG/ACT nasal spray Commonly known as:  FLONASE Place 2 sprays into both nostrils daily as needed for allergies.   ipratropium-albuterol 0.5-2.5 (3) MG/3ML Soln Commonly known as:  DUONEB Take 3 mLs by nebulization every 4 (four) hours as needed.   nitroGLYCERIN 0.4 MG SL tablet Commonly known as:  NITROSTAT Place 1 tablet (0.4 mg total) under the tongue every 5 (five) minutes as needed for chest pain (CP or SOB).   oxyCODONE-acetaminophen 5-325 MG tablet Commonly known as:  PERCOCET/ROXICET Take 1 tablet by mouth every 8 (eight) hours as needed for severe pain.   pantoprazole 40 MG tablet Commonly known as:  PROTONIX Take 1 tablet (40 mg total) by mouth daily.   predniSONE 10 MG tablet Commonly known as:  DELTASONE Take 40 mg tablet 8/17 and taper down by 10 mg daily until completed      Follow-up Information    [name], MD .   Specialty:  Family Medicine Contact information: [place] [address] [phone]            The results of significant diagnostics from this hospitalization (including imaging, microbiology, ancillary and laboratory) are listed below for reference.     Microbiology: Recent Results (from the past 240 hour(s))  Respiratory Panel by PCR     Status: None   Collection Time: 11/06/15  2:14 AM  Result Value Ref Range Status   Adenovirus NOT DETECTED NOT DETECTED Final   Coronavirus 229E NOT DETECTED NOT DETECTED Final   Coronavirus HKU1 NOT DETECTED NOT DETECTED Final   Coronavirus NL63 NOT DETECTED NOT DETECTED  Final   Coronavirus OC43 NOT DETECTED NOT DETECTED Final   Metapneumovirus NOT DETECTED NOT DETECTED Final   Rhinovirus / Enterovirus NOT DETECTED NOT DETECTED Final   Influenza A NOT DETECTED NOT DETECTED Final   Influenza A H1 NOT DETECTED NOT DETECTED Final   Influenza A H1 2009 NOT DETECTED NOT DETECTED Final   Influenza A H3 NOT DETECTED NOT DETECTED Final   Influenza B NOT DETECTED NOT DETECTED Final   Parainfluenza Virus 1 NOT DETECTED NOT DETECTED Final   Parainfluenza Virus 2 NOT DETECTED NOT DETECTED Final   Parainfluenza Virus 3 NOT DETECTED NOT DETECTED Final   Parainfluenza Virus 4 NOT DETECTED NOT DETECTED Final   Respiratory Syncytial Virus NOT DETECTED NOT DETECTED Final   Bordetella pertussis NOT DETECTED NOT DETECTED Final   Chlamydophila  pneumoniae NOT DETECTED NOT DETECTED Final   Mycoplasma pneumoniae NOT DETECTED NOT DETECTED Final    Comment: Performed at [facility]  Culture, blood (routine x 2) Call MD if unable to obtain prior to antibiotics being given     Status: None (Preliminary result)   Collection Time: 11/06/15  2:53 AM  Result Value Ref Range Status   Specimen Description BLOOD RIGHT HAND  Final   Special Requests IN PEDIATRIC BOTTLE 3CC  Final   Culture   Final    NO GROWTH 3 DAYS Performed at [facility]    Report Status PENDING  Incomplete  MRSA PCR Screening     Status: None   Collection Time: 11/06/15  3:35 AM  Result Value Ref Range Status   MRSA by PCR NEGATIVE NEGATIVE Final    Comment:        The GeneXpert MRSA Assay (FDA approved for NASAL specimens only), is one component of a comprehensive MRSA colonization surveillance program. It is not intended to diagnose MRSA infection nor to guide or monitor treatment for MRSA infections.   Culture, blood (routine x 2) Call MD if unable to obtain prior to antibiotics being given     Status: None (Preliminary result)   Collection Time: 11/06/15  4:07 AM  Result Value Ref  Range Status   Specimen Description BLOOD LEFT HAND  Final   Special Requests BOTTLES DRAWN AEROBIC ONLY 5CC  Final   Culture   Final    NO GROWTH 3 DAYS Performed at [facility]    Report Status PENDING  Incomplete     Labs: Basic Metabolic Panel:  Recent Labs Lab 11/05/15 2350 11/09/15 0506  NA 139 134*  K 4.1 4.2  CL 109 105  CO2 23 23  GLUCOSE 100* 144*  BUN 12 27*  CREATININE 0.93 0.81  CALCIUM 9.2 8.5*  MG  --  2.0   CBC:  Recent Labs Lab 11/05/15 2350 11/07/15 0447 11/08/15 0815 11/09/15 0506 11/10/15 0523  WBC 4.8 2.9* 3.2* 2.5* 3.0*  NEUTROABS 2.1  --   --   --   --   HGB 13.9 12.5* 12.7* 12.0* 12.2*  HCT 41.5 37.1* 38.5* 36.1* 37.6*  MCV 93.5 92.5 94.1 94.0 93.3  PLT 131* 112* 137* 124* 130*   Cardiac Enzymes:  Recent Labs Lab 11/06/15 0256 11/06/15 0903 11/06/15 1521  TROPONINI <0.03 <0.03 <0.03   BNP: BNP (last 3 results)  Recent Labs  06/03/15 1045 09/08/15 0334 11/06/15 0407  BNP 11.3 57.2 30.7    SIGNED: Time coordinating discharge: 30 minutes  Faye Ramsay, MD  [facility] 11/10/2015, 11:56 AM Pager [phone]  If 7PM-7AM, please contact night-coverage [email or web address] Password [email or web address]

## 2015-11-10 NOTE — Care Management Important Message (Signed)
Important Message  Patient Details  Name: Trustin Favorito MRN: [id number] Date of Birth: [birth year]   Medicare Important Message Given:  Yes    Camillo Flaming 11/10/2015, 8:53 AMImportant Message  Patient Details  Name: Soliman Puleio MRN: [id number] Date of Birth: 1960-05-31   Medicare Important Message Given:  Yes    Camillo Flaming 11/10/2015, 8:53 AM

## 2015-11-11 LAB — CULTURE, BLOOD (ROUTINE X 2)
CULTURE: NO GROWTH
Culture: NO GROWTH

## 2015-12-14 ENCOUNTER — Emergency Department (HOSPITAL_COMMUNITY)
Admission: EM | Admit: 2015-12-14 | Discharge: 2015-12-14 | Disposition: A | Discharge status: Home / Self Care | Payer: Medicare Other | Attending: Emergency Medicine | Admitting: Emergency Medicine

## 2015-12-14 ENCOUNTER — Encounter (HOSPITAL_COMMUNITY): Payer: Self-pay

## 2015-12-14 ENCOUNTER — Emergency Department (HOSPITAL_COMMUNITY): Payer: Medicare Other

## 2015-12-14 DIAGNOSIS — R05 Cough: Secondary | ICD-10-CM | POA: Diagnosis present

## 2015-12-14 DIAGNOSIS — I110 Hypertensive heart disease with heart failure: Secondary | ICD-10-CM | POA: Insufficient documentation

## 2015-12-14 DIAGNOSIS — Z79899 Other long term (current) drug therapy: Secondary | ICD-10-CM | POA: Diagnosis not present

## 2015-12-14 DIAGNOSIS — Z7901 Long term (current) use of anticoagulants: Secondary | ICD-10-CM | POA: Insufficient documentation

## 2015-12-14 DIAGNOSIS — I2510 Atherosclerotic heart disease of native coronary artery without angina pectoris: Secondary | ICD-10-CM | POA: Diagnosis not present

## 2015-12-14 DIAGNOSIS — I252 Old myocardial infarction: Secondary | ICD-10-CM | POA: Diagnosis not present

## 2015-12-14 DIAGNOSIS — Z955 Presence of coronary angioplasty implant and graft: Secondary | ICD-10-CM | POA: Insufficient documentation

## 2015-12-14 DIAGNOSIS — F17210 Nicotine dependence, cigarettes, uncomplicated: Secondary | ICD-10-CM | POA: Diagnosis not present

## 2015-12-14 DIAGNOSIS — J45901 Unspecified asthma with (acute) exacerbation: Secondary | ICD-10-CM

## 2015-12-14 DIAGNOSIS — I5032 Chronic diastolic (congestive) heart failure: Secondary | ICD-10-CM | POA: Diagnosis not present

## 2015-12-14 DIAGNOSIS — R062 Wheezing: Secondary | ICD-10-CM | POA: Diagnosis not present

## 2015-12-14 MED ORDER — PREDNISONE 50 MG PO TABS: ORAL_TABLET | ORAL | 0 refills | Status: DC

## 2015-12-14 MED ORDER — IPRATROPIUM-ALBUTEROL 0.5-2.5 (3) MG/3ML IN SOLN
3.0000 mL | Freq: Once | RESPIRATORY_TRACT | Status: AC
  Administered 2015-12-14: 3 mL via RESPIRATORY_TRACT
  Filled 2015-12-14: qty 3

## 2015-12-14 MED ORDER — PREDNISONE 20 MG PO TABS
60.0000 mg | ORAL_TABLET | Freq: Once | ORAL | Status: AC
  Administered 2015-12-14: 60 mg via ORAL
  Filled 2015-12-14: qty 3

## 2015-12-14 MED ORDER — ALBUTEROL SULFATE HFA 108 (90 BASE) MCG/ACT IN AERS
1.0000 | INHALATION_SPRAY | Freq: Once | RESPIRATORY_TRACT | Status: AC
  Administered 2015-12-14: 1 via RESPIRATORY_TRACT
  Filled 2015-12-14: qty 6.7

## 2015-12-14 NOTE — ED Provider Notes (Signed)
[place] DEPT Provider Note   CSN: [id number] Arrival date & time: 12/14/15  1044  By signing my name below, I, Evelene Croon, attest that this documentation has been prepared under the direction and in the presence of non-physician practitioner, .Samantha Dowless, PA-C. Electronically Signed: Evelene Croon, Scribe. 12/14/2015. 12:15 PM.   History   Chief Complaint Chief Complaint  Patient presents with  . Cough    The history is provided by the patient. No language interpreter was used.     HPI Comments:  [name] is a 55 y.o. male with a history of asthma and COPD, who presents to the Emergency Department complaining of dry cough with associated SOB x few days. Pt notes he is out of his inhaler; he has been using his at home nebulizer with moderate temporary relief. He denies wheezing, vomiting, diarrhea, CP, and fever. Pt is a current smoker. Pt has a h/o PE/DVT; he is on a Eliquis and is compliant. His last blood clot was 3-4 years ago.   Past Medical History:  Diagnosis Date  . Asthma   . CAD (coronary artery disease)    a. 09/2013 NSTEMI/PCI: LM nl, LAD 40-50%, D1 100 (2.25 x 28 Vision BMS), LCX min irregs, RI 60-70, 30, RCA 40-50/50-70ms/p, EF 45-50%.  b. cath 12/2014 -occulded BMS in diag, 50% pro to mid LAD, 60% ramus, 30% RCA   . Chest pain 08/2015  . Chronic diastolic CHF (congestive heart failure) ([place])    a. 09/2014 EF 45-50% by LV gram;  b. 01/2014 Echo: EF 55-60%, Gr 1 DD.  Marland Kitchen Cocaine abuse   . COPD (chronic obstructive pulmonary disease) ([place])   . DVT (deep venous thrombosis) ([place])    "I had ~ 10 in each leg"  . Essential hypertension   . Hepatitis C   . History of pneumonia   . Osteoarthritis    a. hands and toes.  . Pulmonary embolism ([place])    a. 2012 - s/p IVC filter;  b. prev on eliquis - noncompliant.  . Shortness of breath dyspnea   . Sinus headache   . Tobacco abuse     Patient Active Problem List   Diagnosis Date Noted  . Malnutrition  of moderate degree 06/05/2015  . COPD exacerbation ([place]) 06/03/2015  . Acute on chronic respiratory failure with hypoxia ([place]) 06/03/2015  . Liver fibrosis ([place]) 04/08/2015  . Chest pain   . Pain in the chest   . COPD (chronic obstructive pulmonary disease) ([place]) 01/01/2015  . Essential hypertension 01/01/2015  . Unstable angina ([place]) 01/01/2015  . AKI (acute kidney injury) ([place])   . History of coronary artery disease   . Chronic diastolic CHF (congestive heart failure) ([place])   . Pulmonary embolism ([place])   . DVT (deep venous thrombosis) ([place])   . Acute respiratory failure ([place]) 12/01/2014  . Cocaine abuse 12/01/2014  . Pleuritic chest pain 12/01/2014  . Chronic diastolic heart failure ([place]) 12/01/2014  . QT prolongation 12/01/2014  . Chronic hepatitis C without hepatic coma ([place]) 06/18/2014  . HTN (hypertension) 04/27/2014  . Coronary artery disease involving native coronary artery of native heart without angina pectoris   . CHF (congestive heart failure) ([place]) 01/26/2014  . Acute exacerbation of COPD with asthma ([place]) 01/26/2014  . CAD (coronary artery disease) 10/21/2013  . NSTEMI (non-ST elevated myocardial infarction) ([place]) 10/21/2013  . Anemia 06/20/2013  . Regular alcohol consumption 06/18/2013  . History of DVT (deep vein thrombosis) 02/21/2013  . Thrombocytopenia ([place]) 08/09/2012  .  Tobacco abuse 08/09/2012  . History of noncompliance with medical treatment 03/24/2012  . Atopic dermatitis 03/24/2012  . Asthma exacerbation 02/10/2012  . History of pulmonary embolism 02/10/2012    Past Surgical History:  Procedure Laterality Date  . CARDIAC CATHETERIZATION N/A 01/04/2015   Procedure: Left Heart Cath and Coronary Angiography;  Surgeon: Burnell Blanks, MD;  Location: [place] [facility];  Service: Cardiovascular;  Laterality: N/A;  . CORONARY ANGIOPLASTY WITH STENT PLACEMENT  10/21/13   BMS to D1  . LEFT HEART CATHETERIZATION WITH CORONARY ANGIOGRAM N/A 10/21/2013     Procedure: LEFT HEART CATHETERIZATION WITH CORONARY ANGIOGRAM;  Surgeon: Leonie Man, MD;  Location: [facility] CATH LAB;  Service: Cardiovascular;  Laterality: N/A;  . VENA CAVA FILTER PLACEMENT  " ~ 2012       Home Medications    Prior to Admission medications   Medication Sig Start Date End Date Taking? Authorizing Provider  albuterol (PROVENTIL HFA;VENTOLIN HFA) 108 (90 Base) MCG/ACT inhaler Inhale 2 puffs into the lungs every 6 (six) hours as needed for wheezing or shortness of breath. 11/10/15   Theodis Blaze, MD  amLODipine (NORVASC) 5 MG tablet Take 1 tablet (5 mg total) by mouth daily. 11/10/15   Theodis Blaze, MD  apixaban (ELIQUIS) 5 MG TABS tablet Take 2 tablets (10 mg total) by mouth 2 (two) times daily. 11/10/15   Theodis Blaze, MD  budesonide-formoterol (SYMBICORT) 160-4.5 MCG/ACT inhaler Inhale 2 puffs into the lungs 2 (two) times daily. 11/10/15   Theodis Blaze, MD  dextromethorphan-guaiFENesin (MUCINEX DM) 30-600 MG 12hr tablet Take 1 tablet by mouth 2 (two) times daily. 11/10/15   Theodis Blaze, MD  doxycycline (VIBRA-TABS) 100 MG tablet Take 1 tablet (100 mg total) by mouth every 12 (twelve) hours. 11/10/15   Theodis Blaze, MD  fluticasone (FLONASE) 50 MCG/ACT nasal spray Place 2 sprays into both nostrils daily as needed for allergies. Patient not taking: Reported on 11/05/2015 09/08/15   Mir Marry Guan, MD  ipratropium-albuterol (DUONEB) 0.5-2.5 (3) MG/3ML SOLN Take 3 mLs by nebulization every 4 (four) hours as needed. 11/10/15   Theodis Blaze, MD  nitroGLYCERIN (NITROSTAT) 0.4 MG SL tablet Place 1 tablet (0.4 mg total) under the tongue every 5 (five) minutes as needed for chest pain (CP or SOB). Patient not taking: Reported on 11/05/2015 09/08/15   Mir Marry Guan, MD  oxyCODONE-acetaminophen (PERCOCET/ROXICET) 5-325 MG tablet Take 1 tablet by mouth every 8 (eight) hours as needed for severe pain. 11/10/15   Theodis Blaze, MD  pantoprazole (PROTONIX) 40 MG tablet Take  1 tablet (40 mg total) by mouth daily. 11/10/15   Theodis Blaze, MD  predniSONE (DELTASONE) 10 MG tablet Take 40 mg tablet 8/17 and taper down by 10 mg daily until completed 11/10/15   Theodis Blaze, MD    Family History Family History  Problem Relation Age of Onset  . Asthma Mother   . Allergies Mother   . Allergies Sister   . Allergies Brother   . Deep vein thrombosis Brother     two brothers with recurrent DVT  . Heart attack Father     a.60s b. deceased in his 45s  . Coronary artery disease Father     Social History Social History  Substance Use Topics  . Smoking status: Current Every Day Smoker    Packs/day: 0.25    Years: 38.00    Types: Cigarettes  . Smokeless tobacco: Never Used  .  Alcohol use 3.6 oz/week    6 Cans of beer per week     Comment:  "drink  2 40oz beers weekly"     Allergies   Review of patient's allergies indicates no known allergies.   Review of Systems Review of Systems 10 systems reviewed and all are negative for acute change except as noted in the HPI.  Physical Exam Updated Vital Signs BP (!) 128/102 (BP Location: Left Arm)   Pulse 114   Temp 98 F (36.7 C) (Oral)   Resp 22   SpO2 100%   Physical Exam  Constitutional: He is oriented to person, place, and time. He appears well-developed and well-nourished. No distress.  HENT:  Head: Normocephalic and atraumatic.  Mouth/Throat: Oropharynx is clear and moist. No oropharyngeal exudate.  Eyes: Conjunctivae and EOM are normal. Pupils are equal, round, and reactive to light. Right eye exhibits no discharge. Left eye exhibits no discharge. No scleral icterus.  Cardiovascular: Normal rate, regular rhythm, normal heart sounds and intact distal pulses.  Exam reveals no gallop and no friction rub.   No murmur heard. Pulmonary/Chest: Effort normal. No respiratory distress. He has wheezes. He has no rhonchi. He has no rales. He exhibits no tenderness.  Diffuse inspiratory and expiratory wheezes in  all lung fields   Abdominal: Soft. He exhibits no distension. There is no tenderness. There is no guarding.  Musculoskeletal: Normal range of motion. He exhibits no edema.  Neurological: He is alert and oriented to person, place, and time.  Skin: Skin is warm and dry. No rash noted. He is not diaphoretic. No erythema. No pallor.  Psychiatric: He has a normal mood and affect. His behavior is normal.  Nursing note and vitals reviewed.    ED Treatments / Results  DIAGNOSTIC STUDIES:  Oxygen Saturation is 100% on RA, normal by my interpretation.    COORDINATION OF CARE:  11:46 AM Discussed treatment plan with pt at bedside and pt agreed to plan.  Labs (all labs ordered are listed, but only abnormal results are displayed) Labs Reviewed - No data to display  EKG  EKG Interpretation None       Radiology Dg Chest 2 View  Result Date: 12/14/2015 CLINICAL DATA:  Difficulty breathing and wheezing today. EXAM: CHEST  2 VIEW COMPARISON:  CT chest 11/06/2015.  Chest radiograph 11/06/2015. FINDINGS: The heart size and mediastinal contours are within normal limits. Both lungs are clear. The visualized skeletal structures are unremarkable. Mild changes of bronchiectasis, in correlation with prior CT, are suggested in the upper lobes particularly. IMPRESSION: No active cardiopulmonary disease. Electronically Signed   By: Staci Righter M.D.   On: 12/14/2015 11:17    Procedures Procedures (including critical care time)  Medications Ordered in ED Medications - No data to display   Initial Impression / Assessment and Plan / ED Course  I have reviewed the triage vital signs and the nursing notes.  Pertinent labs & imaging results that were available during my care of the patient were reviewed by me and considered in my medical decision making (see chart for details).  Clinical Course       Patient with mild signs and symptoms of asthma. Diffuse inspiratory and expiratory wheezing heard  on exam. No sign of respiratory distress. Patient speaking in complete sentences. Oxygen saturation is above 90%. No accessory muscle use, no cyanosis. CXR unremarkable. Patient ambulated in ED with O2 saturations maintained >90, no current signs of respiratory distress. Lung exam improved after nebulizer  treatment. Prednisone given in the ED and pt will bd dc with 5 day burst. Pt states they are breathing at baseline. Pt has been instructed to continue using prescribed medications and to speak with PCP about today's exacerbation. Patient is hemodynamically stable. Discussed return precautions. Pt appears safe for discharge.     Final Clinical Impressions(s) / ED Diagnoses   Final diagnoses:  Asthma exacerbation    New Prescriptions New Prescriptions   No medications on file   I personally performed the services described in this documentation, which was scribed in my presence. The recorded information has been reviewed and is accurate.      Dondra Spry [place], PA-C 12/14/15 [place], MD 12/14/15 1620

## 2015-12-14 NOTE — ED Triage Notes (Signed)
Patient here with cough x 2 days, no wheezing , no distress. Used inhaler with no relief, states cough worse at night when he tries to rest. Speaking complete sentences

## 2015-12-14 NOTE — ED Notes (Signed)
Patient transported to X-ray 

## 2015-12-14 NOTE — Discharge Instructions (Signed)
Use albuterol inhaler/nebulizer as needed. Encourage smoking cessation. Take prednisone as prescribed. Follow up with PCP for re-evaluation. Return to the ED if your experience severe worsening of your symptoms, chest pain, difficulty breathing, fever.

## 2015-12-14 NOTE — ED Notes (Signed)
Treatment completed.

## 2015-12-14 NOTE — ED Notes (Signed)
Patient returned from xray.

## 2015-12-14 NOTE — ED Notes (Signed)
Pt c/o SOB x 2 days. Hx of asthma. States he has used his home nebulizer without relief.

## 2016-01-07 ENCOUNTER — Emergency Department (HOSPITAL_COMMUNITY): Payer: Medicare Other

## 2016-01-07 ENCOUNTER — Encounter (HOSPITAL_COMMUNITY): Payer: Self-pay

## 2016-01-07 ENCOUNTER — Emergency Department (HOSPITAL_COMMUNITY)
Admission: EM | Admit: 2016-01-07 | Discharge: 2016-01-07 | Disposition: A | Discharge status: Home / Self Care | Payer: Medicare Other | Attending: Emergency Medicine | Admitting: Emergency Medicine

## 2016-01-07 DIAGNOSIS — R0602 Shortness of breath: Secondary | ICD-10-CM | POA: Diagnosis not present

## 2016-01-07 DIAGNOSIS — I252 Old myocardial infarction: Secondary | ICD-10-CM | POA: Diagnosis not present

## 2016-01-07 DIAGNOSIS — Z955 Presence of coronary angioplasty implant and graft: Secondary | ICD-10-CM | POA: Insufficient documentation

## 2016-01-07 DIAGNOSIS — Z7901 Long term (current) use of anticoagulants: Secondary | ICD-10-CM | POA: Diagnosis not present

## 2016-01-07 DIAGNOSIS — J45901 Unspecified asthma with (acute) exacerbation: Secondary | ICD-10-CM

## 2016-01-07 DIAGNOSIS — I110 Hypertensive heart disease with heart failure: Secondary | ICD-10-CM | POA: Diagnosis not present

## 2016-01-07 DIAGNOSIS — F17210 Nicotine dependence, cigarettes, uncomplicated: Secondary | ICD-10-CM | POA: Insufficient documentation

## 2016-01-07 DIAGNOSIS — I5032 Chronic diastolic (congestive) heart failure: Secondary | ICD-10-CM | POA: Insufficient documentation

## 2016-01-07 DIAGNOSIS — J45909 Unspecified asthma, uncomplicated: Secondary | ICD-10-CM | POA: Diagnosis present

## 2016-01-07 DIAGNOSIS — R05 Cough: Secondary | ICD-10-CM | POA: Diagnosis not present

## 2016-01-07 DIAGNOSIS — I2510 Atherosclerotic heart disease of native coronary artery without angina pectoris: Secondary | ICD-10-CM | POA: Insufficient documentation

## 2016-01-07 MED ORDER — IBUPROFEN 400 MG PO TABS
600.0000 mg | ORAL_TABLET | Freq: Once | ORAL | Status: AC
  Administered 2016-01-07: 600 mg via ORAL
  Filled 2016-01-07: qty 1

## 2016-01-07 MED ORDER — ALBUTEROL SULFATE (2.5 MG/3ML) 0.083% IN NEBU
5.0000 mg | INHALATION_SOLUTION | Freq: Once | RESPIRATORY_TRACT | Status: AC
  Administered 2016-01-07: 5 mg via RESPIRATORY_TRACT

## 2016-01-07 MED ORDER — PREDNISONE 10 MG PO TABS: 40.0000 mg | ORAL_TABLET | Freq: Every day | ORAL | 0 refills | Status: DC

## 2016-01-07 MED ORDER — ALBUTEROL SULFATE (2.5 MG/3ML) 0.083% IN NEBU
INHALATION_SOLUTION | RESPIRATORY_TRACT | Status: AC
  Filled 2016-01-07: qty 6

## 2016-01-07 MED ORDER — ALBUTEROL SULFATE HFA 108 (90 BASE) MCG/ACT IN AERS
1.0000 | INHALATION_SPRAY | Freq: Once | RESPIRATORY_TRACT | Status: AC
  Administered 2016-01-07: 2 via RESPIRATORY_TRACT
  Filled 2016-01-07: qty 6.7

## 2016-01-07 MED ORDER — PREDNISONE 20 MG PO TABS
40.0000 mg | ORAL_TABLET | Freq: Once | ORAL | Status: AC
  Administered 2016-01-07: 40 mg via ORAL
  Filled 2016-01-07: qty 2

## 2016-01-07 MED ORDER — DOXYCYCLINE HYCLATE 100 MG PO CAPS: 100.0000 mg | ORAL_CAPSULE | Freq: Two times a day (BID) | ORAL | 0 refills | Status: DC

## 2016-01-07 NOTE — ED Notes (Signed)
Patient transported to X-ray 

## 2016-01-07 NOTE — ED Triage Notes (Signed)
Pt reports hx of asthma, he reports running out of his inhaler and cannot get in refilled. Pt reports using neb tx at home as well without relief. No distress noted. Pt also reports cough with intermittent white sputum.

## 2016-01-07 NOTE — Discharge Instructions (Signed)
Your chest x ray is concerning for developing pneumonia.  Take all antibiotics as directed and get rechecked immediately if you develop new or worsening symptoms.

## 2016-01-07 NOTE — ED Provider Notes (Signed)
[facility] DEPT Provider Note   CSN: [id number] Arrival date & time: 01/07/16  [id number]     History   Chief Complaint Chief Complaint  Patient presents with  . Asthma    HPI [name] is a 55 y.o. male.  The history is provided by the patient. No language interpreter was used.  Asthma    [name] is a 55 y.o. male who presents to the Emergency Department complaining of SOB.  He has a hx/o COPD and reports increased SOB over the last 1.5 weeks. He has run out of his albuterol inhaler and is using his nebulizer treatments about 6 times a day. He has shortness of breath with cough that is intermittently productive of white sputum. No reports of fevers, chest pain, leg swelling or pain. He does have pain in his right side with coughing. He states this is typical for his asthma flareups. Past Medical History:  Diagnosis Date  . Asthma   . CAD (coronary artery disease)    a. 09/2013 NSTEMI/PCI: LM nl, LAD 40-50%, D1 100 (2.25 x 28 Vision BMS), LCX min irregs, RI 60-70, 30, RCA 40-50/50-5ms/p, EF 45-50%.  b. cath 12/2014 -occulded BMS in diag, 50% pro to mid LAD, 60% ramus, 30% RCA   . Chest pain 08/2015  . Chronic diastolic CHF (congestive heart failure) ([place])    a. 09/2014 EF 45-50% by LV gram;  b. 01/2014 Echo: EF 55-60%, Gr 1 DD.  Marland Kitchen Cocaine abuse   . COPD (chronic obstructive pulmonary disease) ([place])   . DVT (deep venous thrombosis) ([place])    "I had ~ 10 in each leg"  . Essential hypertension   . Hepatitis C   . History of pneumonia   . Osteoarthritis    a. hands and toes.  . Pulmonary embolism ([place])    a. 2012 - s/p IVC filter;  b. prev on eliquis - noncompliant.  . Shortness of breath dyspnea   . Sinus headache   . Tobacco abuse     Patient Active Problem List   Diagnosis Date Noted  . Malnutrition of moderate degree 06/05/2015  . COPD exacerbation ([place]) 06/03/2015  . Acute on chronic respiratory failure with hypoxia ([place]) 06/03/2015  . Liver fibrosis  ([place]) 04/08/2015  . Chest pain   . Pain in the chest   . COPD (chronic obstructive pulmonary disease) ([place]) 01/01/2015  . Essential hypertension 01/01/2015  . Unstable angina ([place]) 01/01/2015  . AKI (acute kidney injury) ([place])   . History of coronary artery disease   . Chronic diastolic CHF (congestive heart failure) ([place])   . Pulmonary embolism ([place])   . DVT (deep venous thrombosis) ([place])   . Acute respiratory failure ([place]) 12/01/2014  . Cocaine abuse 12/01/2014  . Pleuritic chest pain 12/01/2014  . Chronic diastolic heart failure ([place]) 12/01/2014  . QT prolongation 12/01/2014  . Chronic hepatitis C without hepatic coma ([place]) 06/18/2014  . HTN (hypertension) 04/27/2014  . Coronary artery disease involving native coronary artery of native heart without angina pectoris   . CHF (congestive heart failure) ([place]) 01/26/2014  . Acute exacerbation of COPD with asthma ([place]) 01/26/2014  . CAD (coronary artery disease) 10/21/2013  . NSTEMI (non-ST elevated myocardial infarction) ([place]) 10/21/2013  . Anemia 06/20/2013  . Regular alcohol consumption 06/18/2013  . History of DVT (deep vein thrombosis) 02/21/2013  . Thrombocytopenia ([place]) 08/09/2012  . Tobacco abuse 08/09/2012  . History of noncompliance with medical treatment 03/24/2012  . Atopic dermatitis 03/24/2012  .  Asthma exacerbation 02/10/2012  . History of pulmonary embolism 02/10/2012    Past Surgical History:  Procedure Laterality Date  . CARDIAC CATHETERIZATION N/A 01/04/2015   Procedure: Left Heart Cath and Coronary Angiography;  Surgeon: Burnell Blanks, MD;  Location: [facility] CV LAB;  Service: Cardiovascular;  Laterality: N/A;  . CORONARY ANGIOPLASTY WITH STENT PLACEMENT  10/21/13   BMS to D1  . LEFT HEART CATHETERIZATION WITH CORONARY ANGIOGRAM N/A 10/21/2013   Procedure: LEFT HEART CATHETERIZATION WITH CORONARY ANGIOGRAM;  Surgeon: Leonie Man, MD;  Location: [facility] CATH LAB;  Service: Cardiovascular;  Laterality:  N/A;  . VENA CAVA FILTER PLACEMENT  " ~ 2012       Home Medications    Prior to Admission medications   Medication Sig Start Date End Date Taking? Authorizing Provider  albuterol (PROVENTIL HFA;VENTOLIN HFA) 108 (90 Base) MCG/ACT inhaler Inhale 2 puffs into the lungs every 6 (six) hours as needed for wheezing or shortness of breath. 11/10/15  Yes Theodis Blaze, MD  amLODipine (NORVASC) 5 MG tablet Take 1 tablet (5 mg total) by mouth daily. 11/10/15  Yes Theodis Blaze, MD  apixaban (ELIQUIS) 5 MG TABS tablet Take 2 tablets (10 mg total) by mouth 2 (two) times daily. 11/10/15  Yes Theodis Blaze, MD  budesonide-formoterol [facility]) 160-4.5 MCG/ACT inhaler Inhale 2 puffs into the lungs 2 (two) times daily. 11/10/15  Yes Theodis Blaze, MD  fluticasone (FLONASE) 50 MCG/ACT nasal spray Place 2 sprays into both nostrils daily as needed for allergies. 09/08/15  Yes Mir Marry Guan, MD  dextromethorphan-guaiFENesin [facility] DM) 30-600 MG 12hr tablet Take 1 tablet by mouth 2 (two) times daily. Patient not taking: Reported on 01/07/2016 11/10/15   Theodis Blaze, MD  doxycycline (VIBRAMYCIN) 100 MG capsule Take 1 capsule (100 mg total) by mouth 2 (two) times daily. 01/07/16   Quintella Reichert, MD  ipratropium-albuterol (DUONEB) 0.5-2.5 (3) MG/3ML SOLN Take 3 mLs by nebulization every 4 (four) hours as needed. Patient not taking: Reported on 01/07/2016 11/10/15   Theodis Blaze, MD  nitroGLYCERIN (NITROSTAT) 0.4 MG SL tablet Place 1 tablet (0.4 mg total) under the tongue every 5 (five) minutes as needed for chest pain (CP or SOB). Patient not taking: Reported on 11/05/2015 09/08/15   Mir Marry Guan, MD  pantoprazole (PROTONIX) 40 MG tablet Take 1 tablet (40 mg total) by mouth daily. Patient not taking: Reported on 01/07/2016 11/10/15   Theodis Blaze, MD  predniSONE (DELTASONE) 10 MG tablet Take 4 tablets (40 mg total) by mouth daily. 01/07/16   Quintella Reichert, MD    Family History Family History   Problem Relation Age of Onset  . Asthma Mother   . Allergies Mother   . Allergies Sister   . Allergies Brother   . Deep vein thrombosis Brother     two brothers with recurrent DVT  . Heart attack Father     a.60s b. deceased in his 67s  . Coronary artery disease Father     Social History Social History  Substance Use Topics  . Smoking status: Current Every Day Smoker    Packs/day: 0.25    Years: 38.00    Types: Cigarettes  . Smokeless tobacco: Never Used  . Alcohol use 3.6 oz/week    6 Cans of beer per week     Comment:  "drink  2 40oz beers weekly"     Allergies   Review of patient's allergies indicates no known allergies.  Review of Systems Review of Systems  All other systems reviewed and are negative.    Physical Exam Updated Vital Signs BP 127/79   Pulse 89   Temp 98.5 F (36.9 C) (Oral)   Resp 16   Wt 182 lb (82.6 kg)   SpO2 96%   BMI 23.37 kg/m   Physical Exam  Constitutional: He is oriented to person, place, and time. He appears well-developed and well-nourished.  HENT:  Head: Normocephalic and atraumatic.  Cardiovascular: Normal rate and regular rhythm.   No murmur heard. Pulmonary/Chest: Effort normal and breath sounds normal. No respiratory distress.  Right lower chest wall tenderness to palpation in the axillary region  Abdominal: Soft. There is no tenderness. There is no rebound and no guarding.  Musculoskeletal: He exhibits no edema or tenderness.  Neurological: He is alert and oriented to person, place, and time.  Skin: Skin is warm and dry.  Psychiatric: He has a normal mood and affect. His behavior is normal.  Nursing note and vitals reviewed.    ED Treatments / Results  Labs (all labs ordered are listed, but only abnormal results are displayed) Labs Reviewed - No data to display  EKG  EKG Interpretation None       Radiology Dg Chest 2 View  Result Date: 01/07/2016 CLINICAL DATA:  Shortness of breath and productive  cough. EXAM: CHEST  2 VIEW COMPARISON:  12/14/2015 and CT from 11/06/2015. FINDINGS: New streaky densities in the right upper lung. Question a few new streaky densities in the medial left upper lung. Heart and mediastinum are within normal limits. The trachea is midline. Bridging osteophytes in the lower thoracic spine. No large pleural effusions. IMPRESSION: New streaky densities in the right upper lung are concerning for an infectious etiology. Question some disease along the medial left upper lung. Electronically Signed   By: Markus Daft M.D.   On: 01/07/2016 10:44    Procedures Procedures (including critical care time)  Medications Ordered in ED Medications  albuterol (PROVENTIL) (2.5 MG/3ML) 0.083% nebulizer solution 5 mg (5 mg Nebulization Given 01/07/16 0915)  ibuprofen (ADVIL,MOTRIN) tablet 600 mg (600 mg Oral Given 01/07/16 1002)  predniSONE (DELTASONE) tablet 40 mg (40 mg Oral Given 01/07/16 1002)  albuterol (PROVENTIL HFA;VENTOLIN HFA) 108 (90 Base) MCG/ACT inhaler 1-2 puff (2 puffs Inhalation Given 01/07/16 1002)     Initial Impression / Assessment and Plan / ED Course  I have reviewed the triage vital signs and the nursing notes.  Pertinent labs & imaging results that were available during my care of the patient were reviewed by me and considered in my medical decision making (see chart for details).  Clinical Course    Patient here with increased cough and shortness of breath that he attributes to being out of his inhaler. He is in no distress in the emergency department and wheezing resolved following treatment with nebulizer. Chest x-ray is concerning for potential developing pneumonia, will treat with antibiotics. Discussed with patient home care for COPD exacerbation with close outpatient follow-up and return precautions.  Final Clinical Impressions(s) / ED Diagnoses   Final diagnoses:  Exacerbation of asthma, unspecified asthma severity, unspecified whether persistent     New Prescriptions Discharge Medication List as of 01/07/2016 11:53 AM    START taking these medications   Details  doxycycline (VIBRAMYCIN) 100 MG capsule Take 1 capsule (100 mg total) by mouth 2 (two) times daily., Starting Fri 01/07/2016, Print         Quintella Reichert, MD  01/08/16 0831  

## 2016-01-31 ENCOUNTER — Inpatient Hospital Stay (HOSPITAL_COMMUNITY)
Admission: EM | Admit: 2016-01-31 | Discharge: 2016-02-03 | DRG: 191 | Disposition: A | Discharge status: Home / Self Care | Payer: Medicare Other | Attending: Internal Medicine | Admitting: Internal Medicine

## 2016-01-31 DIAGNOSIS — I252 Old myocardial infarction: Secondary | ICD-10-CM

## 2016-01-31 DIAGNOSIS — I10 Essential (primary) hypertension: Secondary | ICD-10-CM | POA: Diagnosis present

## 2016-01-31 DIAGNOSIS — R9431 Abnormal electrocardiogram [ECG] [EKG]: Secondary | ICD-10-CM | POA: Diagnosis present

## 2016-01-31 DIAGNOSIS — R0781 Pleurodynia: Secondary | ICD-10-CM | POA: Diagnosis present

## 2016-01-31 DIAGNOSIS — I4581 Long QT syndrome: Secondary | ICD-10-CM | POA: Diagnosis present

## 2016-01-31 DIAGNOSIS — I5032 Chronic diastolic (congestive) heart failure: Secondary | ICD-10-CM | POA: Diagnosis not present

## 2016-01-31 DIAGNOSIS — D696 Thrombocytopenia, unspecified: Secondary | ICD-10-CM | POA: Diagnosis not present

## 2016-01-31 DIAGNOSIS — Z79899 Other long term (current) drug therapy: Secondary | ICD-10-CM

## 2016-01-31 DIAGNOSIS — R069 Unspecified abnormalities of breathing: Secondary | ICD-10-CM | POA: Diagnosis not present

## 2016-01-31 DIAGNOSIS — I110 Hypertensive heart disease with heart failure: Secondary | ICD-10-CM | POA: Diagnosis not present

## 2016-01-31 DIAGNOSIS — J441 Chronic obstructive pulmonary disease with (acute) exacerbation: Principal | ICD-10-CM | POA: Diagnosis present

## 2016-01-31 DIAGNOSIS — Z86718 Personal history of other venous thrombosis and embolism: Secondary | ICD-10-CM

## 2016-01-31 DIAGNOSIS — I2510 Atherosclerotic heart disease of native coronary artery without angina pectoris: Secondary | ICD-10-CM | POA: Diagnosis present

## 2016-01-31 DIAGNOSIS — Z23 Encounter for immunization: Secondary | ICD-10-CM

## 2016-01-31 DIAGNOSIS — Z7951 Long term (current) use of inhaled steroids: Secondary | ICD-10-CM

## 2016-01-31 DIAGNOSIS — F17210 Nicotine dependence, cigarettes, uncomplicated: Secondary | ICD-10-CM | POA: Diagnosis not present

## 2016-01-31 DIAGNOSIS — Z955 Presence of coronary angioplasty implant and graft: Secondary | ICD-10-CM

## 2016-01-31 DIAGNOSIS — R0602 Shortness of breath: Secondary | ICD-10-CM | POA: Diagnosis not present

## 2016-01-31 DIAGNOSIS — Z86711 Personal history of pulmonary embolism: Secondary | ICD-10-CM

## 2016-01-31 DIAGNOSIS — Z720 Tobacco use: Secondary | ICD-10-CM | POA: Diagnosis present

## 2016-01-31 DIAGNOSIS — Z7901 Long term (current) use of anticoagulants: Secondary | ICD-10-CM

## 2016-01-31 MED ORDER — ALBUTEROL (5 MG/ML) CONTINUOUS INHALATION SOLN
15.0000 mg/h | INHALATION_SOLUTION | Freq: Once | RESPIRATORY_TRACT | Status: AC
  Administered 2016-02-01: 15 mg/h via RESPIRATORY_TRACT
  Filled 2016-01-31: qty 20

## 2016-01-31 MED ORDER — IPRATROPIUM BROMIDE 0.02 % IN SOLN
0.5000 mg | Freq: Once | RESPIRATORY_TRACT | Status: AC
  Administered 2016-02-01: 0.5 mg via RESPIRATORY_TRACT
  Filled 2016-01-31: qty 2.5

## 2016-01-31 MED ORDER — GUAIFENESIN-CODEINE 100-10 MG/5ML PO SOLN
10.0000 mL | Freq: Once | ORAL | Status: AC
  Administered 2016-02-01: 10 mL via ORAL
  Filled 2016-01-31: qty 10

## 2016-01-31 NOTE — ED Provider Notes (Signed)
TIME SEEN:  By signing my name below, I, Arianna Nassar, attest that this documentation has been prepared under the direction and in the presence of [employer], DO.  Electronically Signed: Julien Nordmann, ED Scribe. 02/01/16. 12:13 AM.   CHIEF COMPLAINT:  Chief Complaint  Patient presents with  . COPD     HPI:  HPI Comments: [name] is a 55 y.o. male brought in by ambulance, who has a PMHx of COPD, CAD, CHF, PE, and asthma presents to the Emergency Department complaining of acute, on chronic COPD exacerbation. Per EMS, pt notes having an intermittent, gradual worsening productive cough that brings up yellow and white phlegm for the past week. Pt reports having associated myalgias for the past two weeks, shortness of breath and chest pain described as a soreness secondary to coughing. Pt has been using his inhaler and nebulizer machine more than usual this past week without any relief. He states he was last on prednisone 2.5 weeks ago. He received two duoneb treatments, 125 mg of solumedrol and CPAP in route to help with his symptoms. Pt was tachypneic between 34 and 40. Does not wear oxygen at home. Per chart review, pt was admitted twice on 06/03/15 and 09/08/15 for COPD exacerbation. Lessen in the ED October 13 for the same. Discharge him at that time. Pt smokes ~ 4-5 cigarettes a day.  He has no known drug allergies.     ROS: See HPI Constitutional: no fever  Eyes: no drainage  ENT: no runny nose   Cardiovascular:  (+) chest pain  Resp: (+) SOB  GI: no vomiting GU: no dysuria Integumentary: no rash  Allergy: no hives  Musculoskeletal: no leg swelling  Neurological: no slurred speech ROS otherwise negative  PAST MEDICAL HISTORY/PAST SURGICAL HISTORY:  Past Medical History:  Diagnosis Date  . Asthma   . CAD (coronary artery disease)    a. 09/2013 NSTEMI/PCI: LM nl, LAD 40-50%, D1 100 (2.25 x 28 Vision BMS), LCX min irregs, RI 60-70, 30, RCA 40-50/50-16ms/p, EF 45-50%.   b. cath 12/2014 -occulded BMS in diag, 50% pro to mid LAD, 60% ramus, 30% RCA   . Chest pain 08/2015  . Chronic diastolic CHF (congestive heart failure) ([place])    a. 09/2014 EF 45-50% by LV gram;  b. 01/2014 Echo: EF 55-60%, Gr 1 DD.  Marland Kitchen Cocaine abuse   . COPD (chronic obstructive pulmonary disease) ([place])   . DVT (deep venous thrombosis) ([place])    "I had ~ 10 in each leg"  . Essential hypertension   . Hepatitis C   . History of pneumonia   . Osteoarthritis    a. hands and toes.  . Pulmonary embolism ([place])    a. 2012 - s/p IVC filter;  b. prev on eliquis - noncompliant.  . Shortness of breath dyspnea   . Sinus headache   . Tobacco abuse     MEDICATIONS:  Prior to Admission medications   Medication Sig Start Date End Date Taking? Authorizing Provider  albuterol (PROVENTIL HFA;VENTOLIN HFA) 108 (90 Base) MCG/ACT inhaler Inhale 2 puffs into the lungs every 6 (six) hours as needed for wheezing or shortness of breath. 11/10/15   Theodis Blaze, MD  amLODipine (NORVASC) 5 MG tablet Take 1 tablet (5 mg total) by mouth daily. 11/10/15   Theodis Blaze, MD  apixaban (ELIQUIS) 5 MG TABS tablet Take 2 tablets (10 mg total) by mouth 2 (two) times daily. 11/10/15   Theodis Blaze, MD  budesonide-formoterol (SYMBICORT) 160-4.5 MCG/ACT inhaler Inhale 2 puffs into the lungs 2 (two) times daily. 11/10/15   Theodis Blaze, MD  dextromethorphan-guaiFENesin (MUCINEX DM) 30-600 MG 12hr tablet Take 1 tablet by mouth 2 (two) times daily. Patient not taking: Reported on 01/07/2016 11/10/15   Theodis Blaze, MD  doxycycline (VIBRAMYCIN) 100 MG capsule Take 1 capsule (100 mg total) by mouth 2 (two) times daily. 01/07/16   Quintella Reichert, MD  fluticasone [facility]) 50 MCG/ACT nasal spray Place 2 sprays into both nostrils daily as needed for allergies. 09/08/15   Mir Marry Guan, MD  ipratropium-albuterol (DUONEB) 0.5-2.5 (3) MG/3ML SOLN Take 3 mLs by nebulization every 4 (four) hours as needed. Patient not taking:  Reported on 01/07/2016 11/10/15   Theodis Blaze, MD  nitroGLYCERIN (NITROSTAT) 0.4 MG SL tablet Place 1 tablet (0.4 mg total) under the tongue every 5 (five) minutes as needed for chest pain (CP or SOB). Patient not taking: Reported on 11/05/2015 09/08/15   Mir Marry Guan, MD  pantoprazole (PROTONIX) 40 MG tablet Take 1 tablet (40 mg total) by mouth daily. Patient not taking: Reported on 01/07/2016 11/10/15   Theodis Blaze, MD  predniSONE (DELTASONE) 10 MG tablet Take 4 tablets (40 mg total) by mouth daily. 01/07/16   Quintella Reichert, MD    ALLERGIES:  No Known Allergies  SOCIAL HISTORY:  Social History  Substance Use Topics  . Smoking status: Current Every Day Smoker    Packs/day: 0.25    Years: 38.00    Types: Cigarettes  . Smokeless tobacco: Never Used  . Alcohol use 3.6 oz/week    6 Cans of beer per week     Comment:  "drink  2 40oz beers weekly"    FAMILY HISTORY: Family History  Problem Relation Age of Onset  . Asthma Mother   . Allergies Mother   . Allergies Sister   . Allergies Brother   . Deep vein thrombosis Brother     two brothers with recurrent DVT  . Heart attack Father     a.60s b. deceased in his 75s  . Coronary artery disease Father     EXAM: BP 116/81   Pulse 102   Temp 97.9 F (36.6 C) (Oral)   Resp 18   SpO2 96%  CONSTITUTIONAL: Alert and oriented and responds appropriately to questions. Well-appearing; well-nourished HEAD: Normocephalic EYES: Conjunctivae clear, PERRL ENT: normal nose; no rhinorrhea; moist mucous membranes NECK: Supple, no meningismus, no LAD  CARD: regular and tachycardic; S1 and S2 appreciated; no murmurs, no clicks, no rubs, no gallops RESP: tachypneic, no hypoxia on room air, diffuse expiratory wheezing, dry cough, no rhonchi or rales, no significant respiratory distress, speaking in full sentences ABD/GI: Normal bowel sounds; non-distended; soft, non-tender, no rebound, no guarding, no peritoneal signs BACK:  The back  appears normal and is non-tender to palpation, there is no CVA tenderness EXT: Normal ROM in all joints; non-tender to palpation; no edema; normal capillary refill; no cyanosis, no calf tenderness or swelling    SKIN: Normal color for age and race; warm; no rash NEURO: Moves all extremities equally, sensation to light touch intact diffusely, cranial nerves II through XII intact PSYCH: The patient's mood and manner are appropriate. Grooming and personal hygiene are appropriate.  MEDICAL DECISION MAKING: Patient here with what appears to be a COPD exacerbation. He is wheezing, tachypneic but not hypoxic or in any respiratory distress with significant increased work of breathing. We'll give continuous albuterol, Atrovent. Already  received steroids with EMS. Will obtain labs, chest x-ray. EKG shows no ischemic abnormality. Doubt PE, dissection, ACS.  ED PROGRESS: 2:10 AM  Pt's aeration has improved but he now has increased wheezing, rhonchorous breath sounds. Labs unremarkable. Troponin negative. Chest x-ray clear. I feel he will need admission for a COPD exacerbation. We'll continue duo nebs. Will discuss with medicine for admission. He states that he no longer has a PCP.    2:55 AM  Discussed patient's case with hospitalist, Dr. Tamala Julian.  Recommend admission to medical, observation bed.  I will place holding orders per their request. Patient and family (if present) updated with plan. Care transferred to hospitalist service.  I reviewed all nursing notes, vitals, pertinent old records, EKGs, labs, imaging (as available).    EKG Interpretation  Date/Time:  Tuesday February 01 2016 00:13:07 EST Ventricular Rate:  102 PR Interval:    QRS Duration: 92 QT Interval:  382 QTC Calculation: 498 R Axis:   22 Text Interpretation:  Sinus tachycardia Anteroseptal infarct, age indeterminate No significant change since last tracing Confirmed by Haruko Mersch,  DO, Hassell Patras ([id number]) on 02/01/2016 12:18:38 AM         CRITICAL CARE Performed by: Nyra Jabs   Total critical care time: 35 minutes  Critical care time was exclusive of separately billable procedures and treating other patients.  Critical care was necessary to treat or prevent imminent or life-threatening deterioration.  Critical care was time spent personally by me on the following activities: development of treatment plan with patient and/or surrogate as well as nursing, discussions with consultants, evaluation of patient's response to treatment, examination of patient, obtaining history from patient or surrogate, ordering and performing treatments and interventions, ordering and review of laboratory studies, ordering and review of radiographic studies, pulse oximetry and re-evaluation of patient's condition.   I personally performed the services described in this documentation, which was scribed in my presence. The recorded information has been reviewed and is accurate.    [place], DO 02/01/16 [phone]

## 2016-01-31 NOTE — ED Triage Notes (Signed)
Pt to ED by EMS c/o sob x 2 weeks. Pt has been using inhaler without relief.EMS placed pt on CPAP as this has helped in the past. Pt given duonebs x 2 and 125mg  solumedrol. Pt speaking in full sentences, sats 96%. Pt with rash to abdomen and face. Has had bed bugs in previous living situations. No bed bugs found when pt was undressed.

## 2016-02-01 ENCOUNTER — Emergency Department (HOSPITAL_COMMUNITY): Payer: Medicare Other

## 2016-02-01 ENCOUNTER — Encounter (HOSPITAL_COMMUNITY): Payer: Self-pay | Admitting: *Deleted

## 2016-02-01 DIAGNOSIS — Z7901 Long term (current) use of anticoagulants: Secondary | ICD-10-CM | POA: Diagnosis not present

## 2016-02-01 DIAGNOSIS — D696 Thrombocytopenia, unspecified: Secondary | ICD-10-CM | POA: Diagnosis present

## 2016-02-01 DIAGNOSIS — F17210 Nicotine dependence, cigarettes, uncomplicated: Secondary | ICD-10-CM | POA: Diagnosis present

## 2016-02-01 DIAGNOSIS — Z23 Encounter for immunization: Secondary | ICD-10-CM | POA: Diagnosis not present

## 2016-02-01 DIAGNOSIS — Z79899 Other long term (current) drug therapy: Secondary | ICD-10-CM | POA: Diagnosis not present

## 2016-02-01 DIAGNOSIS — Z86711 Personal history of pulmonary embolism: Secondary | ICD-10-CM | POA: Diagnosis not present

## 2016-02-01 DIAGNOSIS — Z86718 Personal history of other venous thrombosis and embolism: Secondary | ICD-10-CM | POA: Diagnosis not present

## 2016-02-01 DIAGNOSIS — Z7951 Long term (current) use of inhaled steroids: Secondary | ICD-10-CM | POA: Diagnosis not present

## 2016-02-01 DIAGNOSIS — I5032 Chronic diastolic (congestive) heart failure: Secondary | ICD-10-CM | POA: Diagnosis present

## 2016-02-01 DIAGNOSIS — I10 Essential (primary) hypertension: Secondary | ICD-10-CM | POA: Diagnosis not present

## 2016-02-01 DIAGNOSIS — I4581 Long QT syndrome: Secondary | ICD-10-CM | POA: Diagnosis present

## 2016-02-01 DIAGNOSIS — J441 Chronic obstructive pulmonary disease with (acute) exacerbation: Secondary | ICD-10-CM | POA: Diagnosis not present

## 2016-02-01 DIAGNOSIS — R9431 Abnormal electrocardiogram [ECG] [EKG]: Secondary | ICD-10-CM | POA: Diagnosis not present

## 2016-02-01 DIAGNOSIS — I2510 Atherosclerotic heart disease of native coronary artery without angina pectoris: Secondary | ICD-10-CM | POA: Diagnosis present

## 2016-02-01 DIAGNOSIS — I252 Old myocardial infarction: Secondary | ICD-10-CM | POA: Diagnosis not present

## 2016-02-01 DIAGNOSIS — R0781 Pleurodynia: Secondary | ICD-10-CM | POA: Diagnosis present

## 2016-02-01 DIAGNOSIS — Z720 Tobacco use: Secondary | ICD-10-CM

## 2016-02-01 DIAGNOSIS — I110 Hypertensive heart disease with heart failure: Secondary | ICD-10-CM | POA: Diagnosis present

## 2016-02-01 DIAGNOSIS — R0602 Shortness of breath: Secondary | ICD-10-CM | POA: Diagnosis not present

## 2016-02-01 DIAGNOSIS — Z955 Presence of coronary angioplasty implant and graft: Secondary | ICD-10-CM | POA: Diagnosis not present

## 2016-02-01 LAB — RESPIRATORY PANEL BY PCR
ADENOVIRUS-RVPPCR: NOT DETECTED
Bordetella pertussis: NOT DETECTED
CHLAMYDOPHILA PNEUMONIAE-RVPPCR: NOT DETECTED
CORONAVIRUS NL63-RVPPCR: NOT DETECTED
Coronavirus 229E: NOT DETECTED
Coronavirus HKU1: NOT DETECTED
Coronavirus OC43: NOT DETECTED
INFLUENZA A-RVPPCR: NOT DETECTED
INFLUENZA B-RVPPCR: NOT DETECTED
MYCOPLASMA PNEUMONIAE-RVPPCR: NOT DETECTED
Metapneumovirus: NOT DETECTED
PARAINFLUENZA VIRUS 4-RVPPCR: NOT DETECTED
Parainfluenza Virus 1: NOT DETECTED
Parainfluenza Virus 2: NOT DETECTED
Parainfluenza Virus 3: NOT DETECTED
RESPIRATORY SYNCYTIAL VIRUS-RVPPCR: NOT DETECTED
Rhinovirus / Enterovirus: NOT DETECTED

## 2016-02-01 LAB — CBC WITH DIFFERENTIAL/PLATELET
BASOS PCT: 1 %
Basophils Absolute: 0 10*3/uL (ref 0.0–0.1)
EOS ABS: 0.6 10*3/uL (ref 0.0–0.7)
Eosinophils Relative: 15 %
HCT: 37.2 % — ABNORMAL LOW (ref 39.0–52.0)
Hemoglobin: 12.6 g/dL — ABNORMAL LOW (ref 13.0–17.0)
LYMPHS ABS: 1.9 10*3/uL (ref 0.7–4.0)
Lymphocytes Relative: 42 %
MCH: 32 pg (ref 26.0–34.0)
MCHC: 33.9 g/dL (ref 30.0–36.0)
MCV: 94.4 fL (ref 78.0–100.0)
MONO ABS: 0.5 10*3/uL (ref 0.1–1.0)
Monocytes Relative: 11 %
NEUTROS PCT: 31 %
Neutro Abs: 1.3 10*3/uL — ABNORMAL LOW (ref 1.7–7.7)
PLATELETS: 114 10*3/uL — AB (ref 150–400)
RBC: 3.94 MIL/uL — ABNORMAL LOW (ref 4.22–5.81)
RDW: 14.3 % (ref 11.5–15.5)
WBC: 4.3 10*3/uL (ref 4.0–10.5)

## 2016-02-01 LAB — BASIC METABOLIC PANEL
ANION GAP: 12 (ref 5–15)
Anion gap: 12 (ref 5–15)
BUN: 11 mg/dL (ref 6–20)
BUN: 9 mg/dL (ref 6–20)
CALCIUM: 9.1 mg/dL (ref 8.9–10.3)
CHLORIDE: 109 mmol/L (ref 101–111)
CO2: 16 mmol/L — ABNORMAL LOW (ref 22–32)
CO2: 17 mmol/L — AB (ref 22–32)
CREATININE: 1 mg/dL (ref 0.61–1.24)
Calcium: 9.3 mg/dL (ref 8.9–10.3)
Chloride: 109 mmol/L (ref 101–111)
Creatinine, Ser: 0.92 mg/dL (ref 0.61–1.24)
GFR calc Af Amer: >60 mL/min (ref >60)
GFR calc Af Amer: >60 mL/min (ref >60)
GFR calc non Af Amer: >60 mL/min (ref >60)
GLUCOSE: 112 mg/dL — AB (ref 65–99)
Glucose, Bld: 206 mg/dL — ABNORMAL HIGH (ref 65–99)
POTASSIUM: 4.3 mmol/L (ref 3.5–5.1)
Potassium: 4 mmol/L (ref 3.5–5.1)
SODIUM: 137 mmol/L (ref 135–145)
Sodium: 138 mmol/L (ref 135–145)

## 2016-02-01 LAB — RAPID URINE DRUG SCREEN, HOSP PERFORMED
AMPHETAMINES: NOT DETECTED
Barbiturates: NOT DETECTED
Benzodiazepines: NOT DETECTED
Cocaine: POSITIVE — AB
OPIATES: POSITIVE — AB
TETRAHYDROCANNABINOL: NOT DETECTED

## 2016-02-01 LAB — CBC
HEMATOCRIT: 37.8 % — AB (ref 39.0–52.0)
HEMOGLOBIN: 12.6 g/dL — AB (ref 13.0–17.0)
MCH: 31.3 pg (ref 26.0–34.0)
MCHC: 33.3 g/dL (ref 30.0–36.0)
MCV: 94 fL (ref 78.0–100.0)
Platelets: 119 10*3/uL — ABNORMAL LOW (ref 150–400)
RBC: 4.02 MIL/uL — AB (ref 4.22–5.81)
RDW: 14 % (ref 11.5–15.5)
WBC: 2.3 10*3/uL — ABNORMAL LOW (ref 4.0–10.5)

## 2016-02-01 LAB — I-STAT TROPONIN, ED: Troponin i, poc: 0 ng/mL (ref 0.00–0.08)

## 2016-02-01 LAB — TROPONIN I: Troponin I: <0.03 ng/mL (ref <0.03)

## 2016-02-01 MED ORDER — ACETAMINOPHEN 325 MG PO TABS: 650.0000 mg | ORAL_TABLET | Freq: Four times a day (QID) | ORAL | Status: DC | PRN

## 2016-02-01 MED ORDER — KETOROLAC TROMETHAMINE 30 MG/ML IJ SOLN
30.0000 mg | Freq: Once | INTRAMUSCULAR | Status: AC
  Administered 2016-02-01: 30 mg via INTRAVENOUS
  Filled 2016-02-01: qty 1

## 2016-02-01 MED ORDER — ARFORMOTEROL TARTRATE 15 MCG/2ML IN NEBU
15.0000 ug | INHALATION_SOLUTION | Freq: Two times a day (BID) | RESPIRATORY_TRACT | Status: DC
  Administered 2016-02-01 – 2016-02-03 (×5): 15 ug via RESPIRATORY_TRACT
  Filled 2016-02-01 (×5): qty 2

## 2016-02-01 MED ORDER — DEXTROSE 5 % IV SOLN
1.0000 g | INTRAVENOUS | Status: DC
  Administered 2016-02-01 – 2016-02-02 (×2): 1 g via INTRAVENOUS
  Filled 2016-02-01 (×2): qty 10

## 2016-02-01 MED ORDER — BUDESONIDE 0.5 MG/2ML IN SUSP
0.5000 mg | Freq: Two times a day (BID) | RESPIRATORY_TRACT | Status: DC
  Administered 2016-02-01 – 2016-02-03 (×5): 0.5 mg via RESPIRATORY_TRACT
  Filled 2016-02-01 (×6): qty 2

## 2016-02-01 MED ORDER — GUAIFENESIN ER 600 MG PO TB12
600.0000 mg | ORAL_TABLET | Freq: Two times a day (BID) | ORAL | Status: DC
  Administered 2016-02-01 – 2016-02-03 (×5): 600 mg via ORAL
  Filled 2016-02-01 (×5): qty 1

## 2016-02-01 MED ORDER — ACETAMINOPHEN 650 MG RE SUPP: 650.0000 mg | Freq: Four times a day (QID) | RECTAL | Status: DC | PRN

## 2016-02-01 MED ORDER — AMLODIPINE BESYLATE 5 MG PO TABS
5.0000 mg | ORAL_TABLET | Freq: Every day | ORAL | Status: DC
  Administered 2016-02-01 – 2016-02-03 (×3): 5 mg via ORAL
  Filled 2016-02-01 (×3): qty 1

## 2016-02-01 MED ORDER — METHYLPREDNISOLONE SODIUM SUCC 125 MG IJ SOLR
60.0000 mg | Freq: Three times a day (TID) | INTRAMUSCULAR | Status: DC
  Administered 2016-02-01 – 2016-02-02 (×4): 60 mg via INTRAVENOUS
  Filled 2016-02-01 (×4): qty 2

## 2016-02-01 MED ORDER — NITROGLYCERIN 0.4 MG SL SUBL: 0.4000 mg | SUBLINGUAL_TABLET | SUBLINGUAL | Status: DC | PRN

## 2016-02-01 MED ORDER — ORAL CARE MOUTH RINSE
15.0000 mL | Freq: Two times a day (BID) | OROMUCOSAL | Status: DC
  Administered 2016-02-01 – 2016-02-02 (×2): 15 mL via OROMUCOSAL

## 2016-02-01 MED ORDER — APIXABAN 2.5 MG PO TABS
5.0000 mg | ORAL_TABLET | Freq: Two times a day (BID) | ORAL | Status: DC
  Administered 2016-02-01 – 2016-02-03 (×5): 5 mg via ORAL
  Filled 2016-02-01 (×5): qty 2

## 2016-02-01 MED ORDER — ENOXAPARIN SODIUM 40 MG/0.4ML ~~LOC~~ SOLN: 40.0000 mg | SUBCUTANEOUS | Status: DC

## 2016-02-01 MED ORDER — HYDROCODONE-ACETAMINOPHEN 5-325 MG PO TABS
1.0000 | ORAL_TABLET | ORAL | Status: DC | PRN
  Administered 2016-02-01 – 2016-02-03 (×9): 1 via ORAL
  Filled 2016-02-01 (×9): qty 1

## 2016-02-01 MED ORDER — INFLUENZA VAC SPLIT QUAD 0.5 ML IM SUSY
0.5000 mL | PREFILLED_SYRINGE | INTRAMUSCULAR | Status: AC
  Administered 2016-02-03: 0.5 mL via INTRAMUSCULAR
  Filled 2016-02-01: qty 0.5

## 2016-02-01 MED ORDER — ALBUTEROL SULFATE (2.5 MG/3ML) 0.083% IN NEBU
2.5000 mg | INHALATION_SOLUTION | RESPIRATORY_TRACT | Status: DC | PRN
  Administered 2016-02-02: 2.5 mg via RESPIRATORY_TRACT
  Filled 2016-02-01: qty 3

## 2016-02-01 MED ORDER — IPRATROPIUM-ALBUTEROL 0.5-2.5 (3) MG/3ML IN SOLN
3.0000 mL | Freq: Once | RESPIRATORY_TRACT | Status: AC
  Administered 2016-02-01: 3 mL via RESPIRATORY_TRACT
  Filled 2016-02-01: qty 3

## 2016-02-01 MED ORDER — IPRATROPIUM-ALBUTEROL 0.5-2.5 (3) MG/3ML IN SOLN: 3.0000 mL | RESPIRATORY_TRACT | Status: DC

## 2016-02-01 MED ORDER — IPRATROPIUM-ALBUTEROL 0.5-2.5 (3) MG/3ML IN SOLN
3.0000 mL | Freq: Four times a day (QID) | RESPIRATORY_TRACT | Status: DC
  Administered 2016-02-01 – 2016-02-02 (×5): 3 mL via RESPIRATORY_TRACT
  Filled 2016-02-01 (×5): qty 3

## 2016-02-01 MED ORDER — PANTOPRAZOLE SODIUM 40 MG PO TBEC
40.0000 mg | DELAYED_RELEASE_TABLET | Freq: Every day | ORAL | Status: DC
  Administered 2016-02-01 – 2016-02-03 (×3): 40 mg via ORAL
  Filled 2016-02-01 (×3): qty 1

## 2016-02-01 MED ORDER — IPRATROPIUM-ALBUTEROL 0.5-2.5 (3) MG/3ML IN SOLN: 3.0000 mL | RESPIRATORY_TRACT | Status: DC | PRN

## 2016-02-01 NOTE — Progress Notes (Signed)
Received report from ED.  

## 2016-02-01 NOTE — Care Management Note (Signed)
Case Management Note  Patient Details  Name: [name] MRN: [id number] Date of Birth: [birth year]  Subjective/Objective:                 Patient admitted from home with PNA/ COPD exac. HX DVT/PE CHF polysubs abuse. Lives at home with partner and friends. Patient states he does not drive but gets rides from friends and rides the bus when needed as well. Denies difficulties getting medications. States he is in the process of getting a new PCP Dr. Ricki Miller off [facility]. , last saw his previous one 2 months ago. Has nebulizer at home.    Action/Plan:  Watch for home O2 needs.  Expected Discharge Date:                  Expected Discharge Plan:  Home/Self Care  In-House Referral:     Discharge planning Services  CM Consult  Post Acute Care Choice:    Choice offered to:     DME Arranged:    DME Agency:     HH Arranged:    HH Agency:     Status of Service:  In process, will continue to follow  If discussed at Long Length of Stay Meetings, dates discussed:    Additional Comments:  Carles Collet, RN 02/01/2016, 11:17 AM

## 2016-02-01 NOTE — H&P (Signed)
History and Physical    [name] [id number] DOB: [birth year] DOA: 01/31/2016  Referring MD/NP/PA: Dr. Leonides Schanz PCP: [name], MD  Patient coming from: Home  Chief Complaint: Shortness of breath  HPI: [name] is a 55 y.o. male with medical history significant of COPD/asthma, tobacco abuse, CAD s/p stent, CHF, DVT/PE on Eliquis; who presents with complaints of productive cough and shortness of breath aggressively worsened over the last 2 weeks. Patient reports intermittent sputum production that is yellow to white in color. Associated symptoms include wheezing, myalgias, hot/cold flashes, sneezing, and abdominal/chest soreness secondary to coughing. Patient reports utilizing his home inhalers and nebulizer machine more than usual over the past week without any relief of symptoms. He continues to smoke anywhere from 4-5 cigarettes per day on average. He lives with a roommate who intermittently uses the fireplace which patient notes makes his symptoms worse. He had just completed a course of steroids approximately 2 weeks ago. Patient has been admitted into the hospital multiple times within the last 2 years for COPD exacerbations.  ED Course: En route with EMS patient was given 125 mg Solu-Medrol and DuoNeb breathing treatment. Upon admission to the emergency department patient was seen to be afebrile, heart rates 82- 102, respirations 16-32, blood pressure maintained, pulses saturation 90-100% on 2 L nasal cannula oxygen. Lab work revealed WBC 4.3, hemoglobin 12.6, platelets 114, sodium 138, potassium 4, chloride 109, CO2 17, BUN 9, creatinine 1, glucose 112, and anion gap 12. Chest x-ray was noted to be within normal limits. Patient was given two doses of 30 mg of Toradol IV for pain, guaifensin with codeine, and 3 DuoNeb treatments while in the ED. TRH called to admit as respiratory status still was not improved to discharge patient home.   Review of Systems: As per HPI  otherwise 10 point review of systems negative.   Past Medical History:  Diagnosis Date  . Asthma   . CAD (coronary artery disease)    a. 09/2013 NSTEMI/PCI: LM nl, LAD 40-50%, D1 100 (2.25 x 28 Vision BMS), LCX min irregs, RI 60-70, 30, RCA 40-50/50-3ms/p, EF 45-50%.  b. cath 12/2014 -occulded BMS in diag, 50% pro to mid LAD, 60% ramus, 30% RCA   . Chest pain 08/2015  . Chronic diastolic CHF (congestive heart failure) ([place])    a. 09/2014 EF 45-50% by LV gram;  b. 01/2014 Echo: EF 55-60%, Gr 1 DD.  Marland Kitchen Cocaine abuse   . COPD (chronic obstructive pulmonary disease) ([place])   . DVT (deep venous thrombosis) ([place])    "I had ~ 10 in each leg"  . Essential hypertension   . Hepatitis C   . History of pneumonia   . Osteoarthritis    a. hands and toes.  . Pulmonary embolism ([place])    a. 2012 - s/p IVC filter;  b. prev on eliquis - noncompliant.  . Shortness of breath dyspnea   . Sinus headache   . Tobacco abuse     Past Surgical History:  Procedure Laterality Date  . CARDIAC CATHETERIZATION N/A 01/04/2015   Procedure: Left Heart Cath and Coronary Angiography;  Surgeon: Burnell Blanks, MD;  Location: [facility];  Service: Cardiovascular;  Laterality: N/A;  . CORONARY ANGIOPLASTY WITH STENT PLACEMENT  10/21/13   BMS to D1  . LEFT HEART CATHETERIZATION WITH CORONARY ANGIOGRAM N/A 10/21/2013   Procedure: LEFT HEART CATHETERIZATION WITH CORONARY ANGIOGRAM;  Surgeon: Leonie Man, MD;  Location: [facility];  Service: Cardiovascular;  Laterality: N/A;  . VENA CAVA FILTER PLACEMENT  " ~ 2012     reports that he has been smoking Cigarettes.  He has a 9.50 pack-year smoking history. He has never used smokeless tobacco. He reports that he drinks about 3.6 oz of alcohol per week . He reports that he does not use drugs.  No Known Allergies  Family History  Problem Relation Age of Onset  . Asthma Mother   . Allergies Mother   . Allergies Sister   . Allergies Brother   . Deep vein  thrombosis Brother     two brothers with recurrent DVT  . Heart attack Father     a.60s b. deceased in his 20s  . Coronary artery disease Father     Prior to Admission medications   Medication Sig Start Date End Date Taking? Authorizing Provider  albuterol (PROVENTIL HFA;VENTOLIN HFA) 108 (90 Base) MCG/ACT inhaler Inhale 2 puffs into the lungs every 6 (six) hours as needed for wheezing or shortness of breath. 11/10/15  Yes Theodis Blaze, MD  amLODipine (NORVASC) 5 MG tablet Take 1 tablet (5 mg total) by mouth daily. 11/10/15  Yes Theodis Blaze, MD  budesonide-formoterol [facility]) 160-4.5 MCG/ACT inhaler Inhale 2 puffs into the lungs 2 (two) times daily. 11/10/15  Yes Theodis Blaze, MD  ELIQUIS 2.5 MG TABS tablet Take 2.5 mg by mouth 2 (two) times daily. 01/10/16  Yes Historical Provider, MD  ipratropium-albuterol (DUONEB) 0.5-2.5 (3) MG/3ML SOLN Take 3 mLs by nebulization every 4 (four) hours as needed. 11/10/15  Yes Theodis Blaze, MD  nitroGLYCERIN (NITROSTAT) 0.4 MG SL tablet Place 1 tablet (0.4 mg total) under the tongue every 5 (five) minutes as needed for chest pain (CP or SOB). 09/08/15  Yes Mir Marry Guan, MD  apixaban (ELIQUIS) 5 MG TABS tablet Take 2 tablets (10 mg total) by mouth 2 (two) times daily. Patient not taking: Reported on 02/01/2016 11/10/15   Theodis Blaze, MD  dextromethorphan-guaiFENesin [facility] DM) 30-600 MG 12hr tablet Take 1 tablet by mouth 2 (two) times daily. Patient not taking: Reported on 02/01/2016 11/10/15   Theodis Blaze, MD  doxycycline (VIBRAMYCIN) 100 MG capsule Take 1 capsule (100 mg total) by mouth 2 (two) times daily. Patient not taking: Reported on 02/01/2016 01/07/16   Quintella Reichert, MD  fluticasone [facility]) 50 MCG/ACT nasal spray Place 2 sprays into both nostrils daily as needed for allergies. Patient not taking: Reported on 02/01/2016 09/08/15   Mir Marry Guan, MD  pantoprazole (PROTONIX) 40 MG tablet Take 1 tablet (40 mg total) by mouth  daily. Patient not taking: Reported on 02/01/2016 11/10/15   Theodis Blaze, MD  predniSONE (DELTASONE) 10 MG tablet Take 4 tablets (40 mg total) by mouth daily. Patient not taking: Reported on 02/01/2016 01/07/16   Quintella Reichert, MD    Physical Exam:    Constitutional: Older male who appears to be in moderate respiratory distress coughing upon entering the room. Vitals:   02/01/16 0109 02/01/16 0130 02/01/16 0200 02/01/16 0230  BP:  115/84 122/85 118/78  Pulse:  82 88 91  Resp:  19 22 19   Temp:      TempSrc:      SpO2: 97% 99% 100% 90%   Eyes: PERRL, lids and conjunctivae normal ENMT: Mucous membranes are dry. Posterior pharynx clear of any exudate or lesions   Neck: normal, supple, no masses, no thyromegaly Respiratory: Tachypneic with bilateral expiratory wheezes and rhonchi appreciated. Patient unable to  talk in full sentences due to coughing spells. Cardiovascular: Regular rate and rhythm, no murmurs / rubs / gallops. No extremity edema. 2+ pedal pulses. No carotid bruits.  Abdomen:  tenderness to palpation of the upper abdomen, no masses palpated. No hepatosplenomegaly. Bowel sounds positive.  Musculoskeletal: no clubbing / cyanosis. No joint deformity upper and lower extremities. Good ROM, no contractures. Normal muscle tone.  Skin:  2 cm mobile nodule of the upper back,  Neurologic: CN 2-12 grossly intact. Sensation intact, DTR normal. Strength 5/5 in all 4.  Psychiatric:. Alert and oriented x 3. Normal mood.     Labs on Admission: I have personally reviewed following labs and imaging studies  CBC:  Recent Labs Lab 01/31/16 2356  WBC 4.3  NEUTROABS 1.3*  HGB 12.6*  HCT 37.2*  MCV 94.4  PLT 99991111*   Basic Metabolic Panel:  Recent Labs Lab 01/31/16 2356  NA 138  K 4.0  CL 109  CO2 17*  GLUCOSE 112*  BUN 9  CREATININE 1.00  CALCIUM 9.1   GFR: CrCl cannot be calculated (Unknown ideal weight.). Liver Function Tests: No results for input(s): AST, ALT,  ALKPHOS, BILITOT, PROT, ALBUMIN in the last 168 hours. No results for input(s): LIPASE, AMYLASE in the last 168 hours. No results for input(s): AMMONIA in the last 168 hours. Coagulation Profile: No results for input(s): INR, PROTIME in the last 168 hours. Cardiac Enzymes: No results for input(s): CKTOTAL, CKMB, CKMBINDEX, TROPONINI in the last 168 hours. BNP (last 3 results) No results for input(s): PROBNP in the last 8760 hours. HbA1C: No results for input(s): HGBA1C in the last 72 hours. CBG: No results for input(s): GLUCAP in the last 168 hours. Lipid Profile: No results for input(s): CHOL, HDL, LDLCALC, TRIG, CHOLHDL, LDLDIRECT in the last 72 hours. Thyroid Function Tests: No results for input(s): TSH, T4TOTAL, FREET4, T3FREE, THYROIDAB in the last 72 hours. Anemia Panel: No results for input(s): VITAMINB12, FOLATE, FERRITIN, TIBC, IRON, RETICCTPCT in the last 72 hours. Urine analysis:    Component Value Date/Time   COLORURINE YELLOW 09/08/2015 0411   APPEARANCEUR HAZY (A) 09/08/2015 0411   LABSPEC 1.011 09/08/2015 0411   PHURINE 5.5 09/08/2015 0411   GLUCOSEU NEGATIVE 09/08/2015 0411   HGBUR NEGATIVE 09/08/2015 0411   BILIRUBINUR NEGATIVE 09/08/2015 0411   KETONESUR NEGATIVE 09/08/2015 0411   PROTEINUR NEGATIVE 09/08/2015 0411   UROBILINOGEN 0.2 03/28/2012 0118   NITRITE NEGATIVE 09/08/2015 0411   LEUKOCYTESUR NEGATIVE 09/08/2015 0411   Sepsis Labs: No results found for this or any previous visit (from the past 240 hour(s)).   Radiological Exams on Admission: Dg Chest Portable 1 View  Result Date: 02/01/2016 CLINICAL DATA:  Shortness of breath for 2 weeks EXAM: PORTABLE CHEST 1 VIEW COMPARISON:  01/07/2016 FINDINGS: Semi-erect portable view chest demonstrates low lung volumes. No acute infiltrate or effusion. Cardiomediastinal silhouette normal. No pneumothorax. IMPRESSION: No infiltrate or edema Electronically Signed   By: Donavan Foil M.D.   On: 02/01/2016 00:27     EKG: Independently reviewed. Sinus tachycardia 102 bpm with prolonged QTC 498  Assessment/Plan COPD exacerbation : Acute on chronic. Patient reports productive cough over the last 1-2 weeks with wheezing and shortness of breath - Admit MedSurg bed - COPD protocol initiated - Check respiratory viral panel  - Continuous pulse oximetry with nasal cannula oxygen to keep O2 sats greater than 92%  - DuoNeb's 4 times a day and prn SOB/Wheezing - budesonide and Brovana Nebs - IV Solu-Medrol 60 mg every  8 hours - Empiric antibiotics ceftriaxone IV given prolonged QTC  - Mucinex  - Check UDS  Prolonged QTC: This appears to be a chronic issue for the patient - recheck ekg in am  - May consider need to place on telemetry if QTC prolongation appears to be worsening  Chest soreness: Acute. Thought to be secondary to patient's coughing.  Initial troponin negative and EKG showing no overt signs of ischemia.Marland Kitchen - recheck troponin  Essential hypertension - Continue amlodipine  DVT on anticoagulation - Continue Eliquis  Thrombocytopenia: Chronic. Platelet count 114 - Continue to monitor  Tobacco abuse: Patient admits smoking anywhere from 4-5 cigarettes per day on average, but declines nicotine patch - Counseled patient on the cessation of tobacco  DVT prophylaxis: lovenox Code Status: Full Family Communication: No family present at bedside  Disposition Plan: Likely discharge home once medically stable Consults called: None Admission status: Observation  Norval Morton MD [facility] Pager [phone]  If 7PM-7AM, please contact night-coverage [email or web address] Password [email or web address]  02/01/2016, 3:25 AM

## 2016-02-01 NOTE — Care Management Obs Status (Signed)
[place] NOTIFICATION   Patient Details  Name: [name] MRN: [id number] Date of Birth: 1960/05/28   Medicare Observation Status Notification Given:  Yes    Carles Collet, RN 02/01/2016, 11:16 AM

## 2016-02-01 NOTE — Progress Notes (Signed)
Pt admitted to 5w18 from ED. Pt lives at home with roommate. Pt A&Ox4. Pt has acne type rash to abdomen and back with white pus. Pt has dry skin to bilateral feet. Pt will not let nurses look at groin.  Pt oriented to room. Told to call for assistance before getting up, pt stated understanding. Will continue to monitor pt. Ranelle Oyster, RN

## 2016-02-01 NOTE — Progress Notes (Signed)
Patient seen and examined this morning, admitted overnight by Dr. Tamala Julian, H&P reviewed in agreement with the assessment and plan.  In brief, this is a 55 year old gentleman with history of COPD/asthma, ongoing tobacco abuse currently smoking about 4 cigarettes per day, coronary artery disease, CHF with preserved ejection fraction, who presented to the emergency room on 11/6 in the evening with progressively worsening shortness of breath, cough or sputum production, wheezing, shortness of breath and unable to have his symptoms resolved by home treatments.   COPD exacerbation  - Check respiratory viral panel  - DuoNeb's, steroids, empiric antibiotics  Prolonged QTC - This appears to be a chronic issue for the patient, avoid QT prolonging agents  Chest pain - due to patient's coughing.  - troponin negative  Essential hypertension - Continue amlodipine  DVT on anticoagulation - Continue Eliquis  Thrombocytopenia - Continue to monitor  Tobacco abuse - Patient admits smoking anywhere from 4-5 cigarettes per day on average, but declines nicotine patch  Alcohol use - patient denies daily ETOH - no apparent withdrawal symptoms, monitor  Costin M. Cruzita Lederer, MD [facility] [phone]

## 2016-02-01 NOTE — ED Notes (Signed)
Attempted to call report x 1  

## 2016-02-01 NOTE — ED Notes (Signed)
Notified RT of order for hour nebulizer. RT aware.

## 2016-02-01 NOTE — ED Notes (Signed)
Hospitalist at bedside at this time 

## 2016-02-02 DIAGNOSIS — J441 Chronic obstructive pulmonary disease with (acute) exacerbation: Principal | ICD-10-CM

## 2016-02-02 LAB — BASIC METABOLIC PANEL
Anion gap: 9 (ref 5–15)
BUN: 15 mg/dL (ref 6–20)
CHLORIDE: 106 mmol/L (ref 101–111)
CO2: 22 mmol/L (ref 22–32)
Calcium: 9.3 mg/dL (ref 8.9–10.3)
Creatinine, Ser: 0.87 mg/dL (ref 0.61–1.24)
GFR calc Af Amer: >60 mL/min (ref >60)
GFR calc non Af Amer: >60 mL/min (ref >60)
Glucose, Bld: 166 mg/dL — ABNORMAL HIGH (ref 65–99)
Potassium: 4.1 mmol/L (ref 3.5–5.1)
Sodium: 137 mmol/L (ref 135–145)

## 2016-02-02 LAB — CBC
HEMATOCRIT: 37.5 % — AB (ref 39.0–52.0)
HEMOGLOBIN: 12.5 g/dL — AB (ref 13.0–17.0)
MCH: 31.3 pg (ref 26.0–34.0)
MCHC: 33.3 g/dL (ref 30.0–36.0)
MCV: 93.8 fL (ref 78.0–100.0)
Platelets: 138 10*3/uL — ABNORMAL LOW (ref 150–400)
RBC: 4 MIL/uL — ABNORMAL LOW (ref 4.22–5.81)
RDW: 13.8 % (ref 11.5–15.5)
WBC: 3.5 10*3/uL — ABNORMAL LOW (ref 4.0–10.5)

## 2016-02-02 MED ORDER — ALBUTEROL SULFATE (2.5 MG/3ML) 0.083% IN NEBU
2.5000 mg | INHALATION_SOLUTION | RESPIRATORY_TRACT | Status: DC | PRN
  Administered 2016-02-03: 2.5 mg via RESPIRATORY_TRACT
  Filled 2016-02-02: qty 3

## 2016-02-02 MED ORDER — IPRATROPIUM-ALBUTEROL 0.5-2.5 (3) MG/3ML IN SOLN: 3.0000 mL | Freq: Three times a day (TID) | RESPIRATORY_TRACT | Status: DC

## 2016-02-02 MED ORDER — PREDNISONE 50 MG PO TABS
50.0000 mg | ORAL_TABLET | Freq: Every day | ORAL | Status: DC
  Administered 2016-02-03: 50 mg via ORAL
  Filled 2016-02-02: qty 1

## 2016-02-02 MED ORDER — IPRATROPIUM-ALBUTEROL 0.5-2.5 (3) MG/3ML IN SOLN
3.0000 mL | Freq: Four times a day (QID) | RESPIRATORY_TRACT | Status: DC
  Administered 2016-02-02 – 2016-02-03 (×4): 3 mL via RESPIRATORY_TRACT
  Filled 2016-02-02 (×5): qty 3

## 2016-02-02 MED ORDER — DOXYCYCLINE HYCLATE 100 MG PO TABS
100.0000 mg | ORAL_TABLET | Freq: Two times a day (BID) | ORAL | Status: DC
  Administered 2016-02-02 – 2016-02-03 (×3): 100 mg via ORAL
  Filled 2016-02-02 (×3): qty 1

## 2016-02-02 NOTE — Progress Notes (Signed)
PROGRESS NOTE    Zoran Scheid  [id number] DOB: 07/14/1960 DOA: 01/31/2016 PCP: [name], MD     Brief Narrative:  [name] is a 55 y.o. male with medical history significant of COPD/asthma, tobacco abuse, CAD s/p stent, CHF, DVT/PE on Eliquis; who presents with complaints of productive cough and shortness of breath aggressively worsened over the last 2 weeks. Patient reports intermittent sputum production that is yellow to white in color. Associated symptoms include wheezing, myalgias, hot/cold flashes, sneezing, and abdominal/chest soreness secondary to coughing. Patient reports utilizing his home inhalers and nebulizer machine more than usual over the past week without any relief of symptoms. He continues to smoke anywhere from 4-5 cigarettes per day on average. He lives with a roommate who intermittently uses the fireplace which patient notes makes his symptoms worse. He had just completed a course of steroids approximately 2 weeks ago. Patient has been admitted into the hospital multiple times within the last 2 years for COPD exacerbations.  Assessment & Plan:   Principal Problem:   COPD exacerbation ([place]) Active Problems:   Thrombocytopenia ([facility])   Tobacco abuse   HTN (hypertension)   QT prolongation   Chronic anticoagulation   COPD exacerbation  - Respiratory viral panel negative  - Continue brovana, pulmicort, duonebs  - Continue steroids, will deescalate to oral today   - Rocephin, will deescalate to oral doxycycline today  - Mucinex   Prolonged QTC - This appears to be a chronic issue for the patient, avoid QT prolonging agents  Atypical chest pain - Secondary to coughing. Troponin negative.   Essential hypertension - Continue amlodipine  DVT on anticoagulation - Continue Eliquis  Chronic thrombocytopenia - Stable - Continue to monitor  Tobacco abuse - Patient admits smoking anywhere from 4-5 cigarettes per day on average,but  declines nicotine patch. Cessation counseling.   Alcohol use - Patient denies daily ETOH - No apparent withdrawal symptoms, monitor  DVT prophylaxis: lovenox  Code Status: full  Family Communication: no family at bedside Disposition Plan: discharge tomorrow 11/9 if stable and improved on oral agents    Consultants:   None  Procedures:   None   Antimicrobials:   Rocephin     Subjective: Patient continues to complain of productive cough of white sputum, chest pain with cough and bilateral flank pain due to coughing and straining. He is asking for increase in vicodin and script for home; I discussed with him contraindication for chronic narcotic use and he voiced understanding. He denies any fevers, n/v/d/abodminal pain today.   Objective: Vitals:   02/01/16 2055 02/02/16 0121 02/02/16 0445 02/02/16 0834  BP: (!) 152/88  (!) 136/92   Pulse: 95  100   Resp: 18  18   Temp: 98.5 F (36.9 C)  98.2 F (36.8 C)   TempSrc: Oral  Oral   SpO2: 97% 97% 97% 97%  Weight:      Height:        Intake/Output Summary (Last 24 hours) at 02/02/16 0850 Last data filed at 02/02/16 0546  Gross per 24 hour  Intake              530 ml  Output             2000 ml  Net            -1470 ml   Filed Weights   02/01/16 0519  Weight: 81.9 kg (180 lb 8 oz)    Examination:  General exam: Appears calm  and comfortable  Respiratory system: bilateral diffuse rhonchi and wheezes, not on [place] O2  Cardiovascular system: S1 & S2 heard, RRR. No JVD, murmurs, rubs, gallops or clicks. No pedal edema. Gastrointestinal system: Abdomen is nondistended, soft and nontender. No organomegaly or masses felt. Normal bowel sounds heard. Central nervous system: Alert and oriented. No focal neurological deficits. Extremities: Symmetric 5 x 5 power. Skin: No rashes, lesions or ulcers Psychiatry: Judgement and insight appear normal. Mood & affect appropriate.   Data Reviewed: I have personally reviewed following  labs and imaging studies  CBC:  Recent Labs Lab 01/31/16 2356 02/01/16 0843 02/02/16 0643  WBC 4.3 2.3* 3.5*  NEUTROABS 1.3*  --   --   HGB 12.6* 12.6* 12.5*  HCT 37.2* 37.8* 37.5*  MCV 94.4 94.0 93.8  PLT 114* 119* 0000000*   Basic Metabolic Panel:  Recent Labs Lab 01/31/16 2356 02/01/16 0843 02/02/16 0643  NA 138 137 137  K 4.0 4.3 4.1  CL 109 109 106  CO2 17* 16* 22  GLUCOSE 112* 206* 166*  BUN 9 11 15   CREATININE 1.00 0.92 0.87  CALCIUM 9.1 9.3 9.3   GFR: Estimated Creatinine Clearance: 111.1 mL/min (by C-G formula based on SCr of 0.87 mg/dL). Liver Function Tests: No results for input(s): AST, ALT, ALKPHOS, BILITOT, PROT, ALBUMIN in the last 168 hours. No results for input(s): LIPASE, AMYLASE in the last 168 hours. No results for input(s): AMMONIA in the last 168 hours. Coagulation Profile: No results for input(s): INR, PROTIME in the last 168 hours. Cardiac Enzymes:  Recent Labs Lab 02/01/16 0843  TROPONINI <0.03   BNP (last 3 results) No results for input(s): PROBNP in the last 8760 hours. HbA1C: No results for input(s): HGBA1C in the last 72 hours. CBG: No results for input(s): GLUCAP in the last 168 hours. Lipid Profile: No results for input(s): CHOL, HDL, LDLCALC, TRIG, CHOLHDL, LDLDIRECT in the last 72 hours. Thyroid Function Tests: No results for input(s): TSH, T4TOTAL, FREET4, T3FREE, THYROIDAB in the last 72 hours. Anemia Panel: No results for input(s): VITAMINB12, FOLATE, FERRITIN, TIBC, IRON, RETICCTPCT in the last 72 hours. Sepsis Labs: No results for input(s): PROCALCITON, LATICACIDVEN in the last 168 hours.  Recent Results (from the past 240 hour(s))  Respiratory Panel by PCR     Status: None   Collection Time: 02/01/16  4:57 AM  Result Value Ref Range Status   Adenovirus NOT DETECTED NOT DETECTED Final   Coronavirus 229E NOT DETECTED NOT DETECTED Final   Coronavirus HKU1 NOT DETECTED NOT DETECTED Final   Coronavirus NL63 NOT  DETECTED NOT DETECTED Final   Coronavirus OC43 NOT DETECTED NOT DETECTED Final   Metapneumovirus NOT DETECTED NOT DETECTED Final   Rhinovirus / Enterovirus NOT DETECTED NOT DETECTED Final   Influenza A NOT DETECTED NOT DETECTED Final   Influenza B NOT DETECTED NOT DETECTED Final   Parainfluenza Virus 1 NOT DETECTED NOT DETECTED Final   Parainfluenza Virus 2 NOT DETECTED NOT DETECTED Final   Parainfluenza Virus 3 NOT DETECTED NOT DETECTED Final   Parainfluenza Virus 4 NOT DETECTED NOT DETECTED Final   Respiratory Syncytial Virus NOT DETECTED NOT DETECTED Final   Bordetella pertussis NOT DETECTED NOT DETECTED Final   Chlamydophila pneumoniae NOT DETECTED NOT DETECTED Final   Mycoplasma pneumoniae NOT DETECTED NOT DETECTED Final       Radiology Studies: Dg Chest Portable 1 View  Result Date: 02/01/2016 CLINICAL DATA:  Shortness of breath for 2 weeks EXAM: PORTABLE CHEST 1 VIEW  COMPARISON:  01/07/2016 FINDINGS: Semi-erect portable view chest demonstrates low lung volumes. No acute infiltrate or effusion. Cardiomediastinal silhouette normal. No pneumothorax. IMPRESSION: No infiltrate or edema Electronically Signed   By: Donavan Foil M.D.   On: 02/01/2016 00:27      Scheduled Meds: . amLODipine  5 mg Oral Daily  . apixaban  5 mg Oral BID  . arformoterol  15 mcg Nebulization BID  . budesonide (PULMICORT) nebulizer solution  0.5 mg Nebulization BID  . cefTRIAXone (ROCEPHIN)  IV  1 g Intravenous Q24H  . guaiFENesin  600 mg Oral BID  . Influenza vac split quadrivalent PF  0.5 mL Intramuscular Tomorrow-1000  . ipratropium-albuterol  3 mL Nebulization TID  . mouth rinse  15 mL Mouth Rinse BID  . methylPREDNISolone (SOLU-MEDROL) injection  60 mg Intravenous Q8H  . pantoprazole  40 mg Oral Daily   Continuous Infusions:   LOS: 1 day    Time spent: 40 minutes   Dessa Phi, DO [facility] [email or web address] Password [email or web address] 02/02/2016, 8:50 AM

## 2016-02-03 MED ORDER — ELIQUIS 2.5 MG PO TABS: 5.0000 mg | ORAL_TABLET | Freq: Two times a day (BID) | ORAL | 0 refills | Status: DC

## 2016-02-03 MED ORDER — BUDESONIDE-FORMOTEROL FUMARATE 160-4.5 MCG/ACT IN AERO
2.0000 | INHALATION_SPRAY | Freq: Two times a day (BID) | RESPIRATORY_TRACT | 0 refills | Status: DC
Start: 2016-02-03 — End: 2016-02-07

## 2016-02-03 MED ORDER — DOXYCYCLINE HYCLATE 100 MG PO TABS: 100.0000 mg | ORAL_TABLET | Freq: Two times a day (BID) | ORAL | 0 refills | Status: AC

## 2016-02-03 MED ORDER — PREDNISONE 10 MG PO TABS: ORAL_TABLET | ORAL | 0 refills | Status: DC

## 2016-02-03 MED ORDER — ALBUTEROL SULFATE HFA 108 (90 BASE) MCG/ACT IN AERS: 2.0000 | INHALATION_SPRAY | Freq: Four times a day (QID) | RESPIRATORY_TRACT | 0 refills | Status: DC | PRN

## 2016-02-03 NOTE — Progress Notes (Signed)
SATURATION QUALIFICATIONS: (This note is used to comply with regulatory documentation for home oxygen)  Patient Saturations on Room Air at Rest = 100%  Patient Saturations on Room Air while Ambulating = 97 %   Please briefly explain why patient needs home oxygen: Denies shortness of breath while ambulating.

## 2016-02-03 NOTE — Hospital Discharge Follow-Up (Signed)
Transitional Care Clinic Care Coordination Note:  Admit date:  02/01/16 Discharge date: 02/03/16 Discharge Disposition: Home  Patient contact: [phone] Emergency contact(s): Gardenia Phlegm (friend)-#[phone]  This Case Manager reviewed patient's EMR and determined patient would benefit from post-discharge medical management and chronic care management services through the [facility]. Patient has a history of COPD/asthma, CAD, CHF, DVT/PE on eliquis. Admitted with COPD exacerbation. Patient has had three inpatient admission and two ED visits in the last six months. This Case Manager met with patient to discuss the services and medical management that can be provided at the [facility]. Patient verbalized understanding and agreed to receive post-discharge care at the [facility].   Patient scheduled for Transitional Care appointment on 02/07/16 at 1000 with Dr. Jarold Song.  Clinic information and appointment time provided to patient. Appointment information also placed on AVS.  Assessment:       Home Environment: Patient lives with a roommate.       Support System: friends       Level of functioning: independent        Home DME: nebulizer       Home care services: none       Transportation: Patient typically takes the bus or a friend takes patient to medical appointments.         Food/Nutrition: Patient has access to needed food.         Medications: Patient typically gets his medications from [facility] on [address]. Patient denies problems affording or obtaining needed medications.         Identified Barriers: no PCP        PCP: Patient indicates his PCP moved, and he no longer has a PCP. Patient able to designate Dr. Jarold Song (or PCP of choice) after 30 days of medical management with the [facility].              Arranged services:        Services communicated to Lacinda Axon, RN CM

## 2016-02-03 NOTE — Progress Notes (Signed)
IV removed. D/c instructions given. No issue at this time. Awaiting wheelchair for discharge.   Roxie Gueye, Mervin Kung RN

## 2016-02-03 NOTE — Discharge Summary (Signed)
Physician Discharge Summary  [name] [id number] DOB: [birth year] DOA: 01/31/2016  PCP: [name], MD  Admit date: 01/31/2016 Discharge date: 02/03/2016  Admitted From: Home Disposition:  Home  Recommendations for Outpatient Follow-up:  1. Follow up with PCP in 1-2 weeks  Home Health: no  Equipment/Devices: none   Discharge Condition: stable CODE STATUS: full  Diet recommendation: heart healthy   Brief/Interim Summary: From H&P: [name] a 56 y.o.malewith medical history significant of COPD/asthma, tobacco abuse, CAD s/p stent, CHF, DVT/PE on Eliquis; who presents with complaints of productive cough and shortness of breath aggressively worsened over the last 2 weeks. Patient reports intermittent sputum production that is yellow to white in color. Associated symptoms include wheezing,myalgias, hot/cold flashes, sneezing, and abdominal/chest soreness secondary to coughing. Patient reports utilizing his home inhalers and nebulizer machine more than usual over the past week without any relief of symptoms. He continues to smoke anywhere from 4-5 cigarettes per day on average. He lives with a roommate who intermittently uses thefireplace which patient notes makes his symptoms worse. He had just completed a course of steroids approximately 2 weeks ago. Patient has been admitted into the hospital multiple times within the last 2 years for COPD exacerbations.  Interim: During hospitalization, patient was treated with IV steroids, as well as antibiotics and nebulizer treatments. Patient continued to improve.  Subjective on day of discharge: Feeling well today. Denies shortness of breath. Continues to have productive cough. Overall improved since admission. Patient continues to ask for Vicodin for his flank pain that worsens with cough. No pain without cough. Advised patient to follow up outpatient and patient may take Tylenol, ibuprofen over-the-counter. No narcotics will  be prescribed at time of discharge.  Discharge Diagnoses:  Principal Problem:   COPD exacerbation ([place]) Active Problems:   Thrombocytopenia ([facility])   Tobacco abuse   HTN (hypertension)   Pleuritic chest pain   QT prolongation   Chronic anticoagulation   COPD exacerbation - Respiratory viral panel negative  - Continue brovana, pulmicort, duonebs  - We'll discharge home on doxycycline and prednisone taper - We'll discharge home with albuterol, Symbicort    Prolonged QTC - This appears to be a chronic issue for the patient, avoid QT prolonging agents  Atypical chest pain - Secondary to coughing. Troponin negative.   Essential hypertension - Continue amlodipine  DVT on anticoagulation - Continue Eliquis  Chronic thrombocytopenia - Stable - Continue to monitor  Tobacco abuse - Patient admits smoking anywhere from 4-5 cigarettes per day on average,but declines nicotine patch. Cessation counseling.   Alcohol use - Patient denies daily ETOH - No apparent withdrawal symptoms, monitor  Discharge Instructions  Discharge Instructions    Diet - low sodium heart healthy    Complete by:  As directed    Increase activity slowly    Complete by:  As directed        Medication List    TAKE these medications   albuterol 108 (90 Base) MCG/ACT inhaler Commonly known as:  PROVENTIL HFA;VENTOLIN HFA Inhale 2 puffs into the lungs every 6 (six) hours as needed for wheezing or shortness of breath.   amLODipine 5 MG tablet Commonly known as:  NORVASC Take 1 tablet (5 mg total) by mouth daily.   budesonide-formoterol 160-4.5 MCG/ACT inhaler Commonly known as:  SYMBICORT Inhale 2 puffs into the lungs 2 (two) times daily.   doxycycline 100 MG tablet Commonly known as:  VIBRA-TABS Take 1 tablet (100 mg total) by mouth every 12 (  twelve) hours.   ELIQUIS 2.5 MG Tabs tablet Generic drug:  apixaban Take 2 tablets (5 mg total) by mouth 2 (two) times daily. What changed:   how much to take   ipratropium-albuterol 0.5-2.5 (3) MG/3ML Soln Commonly known as:  DUONEB Take 3 mLs by nebulization every 4 (four) hours as needed.   nitroGLYCERIN 0.4 MG SL tablet Commonly known as:  NITROSTAT Place 1 tablet (0.4 mg total) under the tongue every 5 (five) minutes as needed for chest pain (CP or SOB).   predniSONE 10 MG tablet Commonly known as:  DELTASONE Take 4 tabs for 3 days, then 3 tabs for 3 days, then 2 tabs for 3 days, then 1 tab for 3 days, then 1/2 tab for 4 days.      Follow-up Information    [place]. Schedule an appointment as soon as possible for a visit in 1 week(s).   Contact information: [address] [place] [phone]         No Known Allergies  Consultations:  None    Procedures/Studies: Dg Chest 2 View  Result Date: 01/07/2016 CLINICAL DATA:  Shortness of breath and productive cough. EXAM: CHEST  2 VIEW COMPARISON:  12/14/2015 and CT from 11/06/2015. FINDINGS: New streaky densities in the right upper lung. Question a few new streaky densities in the medial left upper lung. Heart and mediastinum are within normal limits. The trachea is midline. Bridging osteophytes in the lower thoracic spine. No large pleural effusions. IMPRESSION: New streaky densities in the right upper lung are concerning for an infectious etiology. Question some disease along the medial left upper lung. Electronically Signed   By: Markus Daft M.D.   On: 01/07/2016 10:44   Dg Chest Portable 1 View  Result Date: 02/01/2016 CLINICAL DATA:  Shortness of breath for 2 weeks EXAM: PORTABLE CHEST 1 VIEW COMPARISON:  01/07/2016 FINDINGS: Semi-erect portable view chest demonstrates low lung volumes. No acute infiltrate or effusion. Cardiomediastinal silhouette normal. No pneumothorax. IMPRESSION: No infiltrate or edema Electronically Signed   By: Donavan Foil M.D.   On: 02/01/2016 00:27    Discharge  Exam: Vitals:   02/02/16 2146 02/03/16 0540  BP: 128/79 137/90  Pulse: 76 71  Resp: 17 20  Temp: 98.7 F (37.1 C) 98.1 F (36.7 C)   Vitals:   02/02/16 2018 02/02/16 2146 02/03/16 0540 02/03/16 0752  BP:  128/79 137/90   Pulse:  76 71   Resp:  17 20   Temp:  98.7 F (37.1 C) 98.1 F (36.7 C)   TempSrc:  Oral Oral   SpO2: 98% 98% 98% 96%  Weight:      Height:        General: Pt is alert, awake, not in acute distress Cardiovascular: RRR, S1/S2 +, no rubs, no gallops Respiratory: some wheezing bilaterally, no rhonchi, no respiratory distress, no accessory muscle use, on room air.  Abdominal: Soft, NT, ND, bowel sounds + Extremities: no edema, no cyanosis    The results of significant diagnostics from this hospitalization (including imaging, microbiology, ancillary and laboratory) are listed below for reference.     Microbiology: Recent Results (from the past 240 hour(s))  Respiratory Panel by PCR     Status: None   Collection Time: 02/01/16  4:57 AM  Result Value Ref Range Status   Adenovirus NOT DETECTED NOT DETECTED Final   Coronavirus 229E NOT DETECTED NOT DETECTED Final   Coronavirus HKU1 NOT DETECTED  NOT DETECTED Final   Coronavirus NL63 NOT DETECTED NOT DETECTED Final   Coronavirus OC43 NOT DETECTED NOT DETECTED Final   Metapneumovirus NOT DETECTED NOT DETECTED Final   Rhinovirus / Enterovirus NOT DETECTED NOT DETECTED Final   Influenza A NOT DETECTED NOT DETECTED Final   Influenza B NOT DETECTED NOT DETECTED Final   Parainfluenza Virus 1 NOT DETECTED NOT DETECTED Final   Parainfluenza Virus 2 NOT DETECTED NOT DETECTED Final   Parainfluenza Virus 3 NOT DETECTED NOT DETECTED Final   Parainfluenza Virus 4 NOT DETECTED NOT DETECTED Final   Respiratory Syncytial Virus NOT DETECTED NOT DETECTED Final   Bordetella pertussis NOT DETECTED NOT DETECTED Final   Chlamydophila pneumoniae NOT DETECTED NOT DETECTED Final   Mycoplasma pneumoniae NOT DETECTED NOT DETECTED  Final     Labs: BNP (last 3 results)  Recent Labs  06/03/15 1045 09/08/15 0334 11/06/15 0407  BNP 11.3 57.2 [id number]   Basic Metabolic Panel:  Recent Labs Lab 01/31/16 2356 02/01/16 0843 02/02/16 0643  NA 138 137 137  K 4.0 4.3 4.1  CL 109 109 106  CO2 17* 16* 22  GLUCOSE 112* 206* 166*  BUN 9 11 15   CREATININE 1.00 0.92 0.87  CALCIUM 9.1 9.3 9.3   Liver Function Tests: No results for input(s): AST, ALT, ALKPHOS, BILITOT, PROT, ALBUMIN in the last 168 hours. No results for input(s): LIPASE, AMYLASE in the last 168 hours. No results for input(s): AMMONIA in the last 168 hours. CBC:  Recent Labs Lab 01/31/16 2356 02/01/16 0843 02/02/16 0643  WBC 4.3 2.3* 3.5*  NEUTROABS 1.3*  --   --   HGB 12.6* 12.6* 12.5*  HCT 37.2* 37.8* 37.5*  MCV 94.4 94.0 93.8  PLT 114* 119* 138*   Cardiac Enzymes:  Recent Labs Lab 02/01/16 0843  TROPONINI <0.03   BNP: Invalid input(s): POCBNP CBG: No results for input(s): GLUCAP in the last 168 hours. D-Dimer No results for input(s): DDIMER in the last 72 hours. Hgb A1c No results for input(s): HGBA1C in the last 72 hours. Lipid Profile No results for input(s): CHOL, HDL, LDLCALC, TRIG, CHOLHDL, LDLDIRECT in the last 72 hours. Thyroid function studies No results for input(s): TSH, T4TOTAL, T3FREE, THYROIDAB in the last 72 hours.  Invalid input(s): FREET3 Anemia work up No results for input(s): VITAMINB12, FOLATE, FERRITIN, TIBC, IRON, RETICCTPCT in the last 72 hours. Urinalysis    Component Value Date/Time   COLORURINE YELLOW 09/08/2015 0411   APPEARANCEUR HAZY (A) 09/08/2015 0411   LABSPEC 1.011 09/08/2015 0411   PHURINE 5.5 09/08/2015 0411   GLUCOSEU NEGATIVE 09/08/2015 0411   HGBUR NEGATIVE 09/08/2015 0411   BILIRUBINUR NEGATIVE 09/08/2015 0411   KETONESUR NEGATIVE 09/08/2015 0411   PROTEINUR NEGATIVE 09/08/2015 0411   UROBILINOGEN 0.2 03/28/2012 0118   NITRITE NEGATIVE 09/08/2015 0411   LEUKOCYTESUR NEGATIVE  09/08/2015 0411   Sepsis Labs Invalid input(s): PROCALCITONIN,  WBC,  LACTICIDVEN Microbiology Recent Results (from the past 240 hour(s))  Respiratory Panel by PCR     Status: None   Collection Time: 02/01/16  4:57 AM  Result Value Ref Range Status   Adenovirus NOT DETECTED NOT DETECTED Final   Coronavirus 229E NOT DETECTED NOT DETECTED Final   Coronavirus HKU1 NOT DETECTED NOT DETECTED Final   Coronavirus NL63 NOT DETECTED NOT DETECTED Final   Coronavirus OC43 NOT DETECTED NOT DETECTED Final   Metapneumovirus NOT DETECTED NOT DETECTED Final   Rhinovirus / Enterovirus NOT DETECTED NOT DETECTED Final   Influenza A NOT  DETECTED NOT DETECTED Final   Influenza B NOT DETECTED NOT DETECTED Final   Parainfluenza Virus 1 NOT DETECTED NOT DETECTED Final   Parainfluenza Virus 2 NOT DETECTED NOT DETECTED Final   Parainfluenza Virus 3 NOT DETECTED NOT DETECTED Final   Parainfluenza Virus 4 NOT DETECTED NOT DETECTED Final   Respiratory Syncytial Virus NOT DETECTED NOT DETECTED Final   Bordetella pertussis NOT DETECTED NOT DETECTED Final   Chlamydophila pneumoniae NOT DETECTED NOT DETECTED Final   Mycoplasma pneumoniae NOT DETECTED NOT DETECTED Final     Time coordinating discharge: Over 30 minutes  SIGNED:  Dessa Phi, DO [facility] Pager [phone]  If 7PM-7AM, please contact night-coverage [email or web address] Password [email or web address] 02/03/2016, 11:00 AM

## 2016-02-04 ENCOUNTER — Telehealth: Payer: Self-pay

## 2016-02-04 NOTE — Telephone Encounter (Signed)
[facility] Post-discharge Follow-Up Phone Call:  Date of Discharge: 02/03/16 Principal Discharge Diagnosis(es): COPD exacerbation  Call Completed: Yes                   With Whom: Patient   Please check all that apply:  X  Patient is knowledgeable of his/her condition(s) and/or treatment. X  Patient is caring for self at home.  ? Patient is receiving assist at home from family and/or caregiver. Family and/or caregiver is knowledgeable of patient's condition(s) and/or treatment. ? Patient is receiving home health services. If so, name of agency.     Medication Reconciliation:  X  Medication list reviewed with patient. Patient has all medications except nitroglycerin sublingual tablets. Will route telephone encounter to Dr. Jarold Song so she is aware of patient's need for medication. Patient has initial [facility] appointment on 02/07/16 at 1000 with Dr. Jarold Song. X  Patient obtained all discharge medications-YES. Patient has picked up all discharge medications from pharmacy. Thoroughly reviewed patient's discharge medication list with patient. Reviewed instructions on how to take each medication. Patient verbalized understanding.   Activities of Daily Living:  X  Independent-has home nebulizer ? Needs assist  ? [facility]  Community resources in place for patient:  X  None  ? Home Health/Home DME ? Assisted Living ? Support Group        Questions/Concerns discussed: This Case Manager inquired about patient's status. Patient denied shortness of breath and denied problems or health concerns at this time. Reminded patient of initial [facility] appointment on 02/07/16 at 1000 and reminded patient of location and contact information for clinic. Patient appreciative of information. No transportation barriers identified.

## 2016-02-07 ENCOUNTER — Ambulatory Visit: Payer: Medicare Other | Attending: Family Medicine | Admitting: Family Medicine

## 2016-02-07 ENCOUNTER — Encounter: Payer: Self-pay | Admitting: Family Medicine

## 2016-02-07 VITALS — BP 110/73 | HR 73 | Temp 98.1°F | Ht 74.0 in | Wt 191.8 lb

## 2016-02-07 DIAGNOSIS — I252 Old myocardial infarction: Secondary | ICD-10-CM | POA: Insufficient documentation

## 2016-02-07 DIAGNOSIS — I2510 Atherosclerotic heart disease of native coronary artery without angina pectoris: Secondary | ICD-10-CM | POA: Diagnosis not present

## 2016-02-07 DIAGNOSIS — I110 Hypertensive heart disease with heart failure: Secondary | ICD-10-CM | POA: Diagnosis not present

## 2016-02-07 DIAGNOSIS — J441 Chronic obstructive pulmonary disease with (acute) exacerbation: Secondary | ICD-10-CM | POA: Diagnosis not present

## 2016-02-07 DIAGNOSIS — B1920 Unspecified viral hepatitis C without hepatic coma: Secondary | ICD-10-CM | POA: Diagnosis not present

## 2016-02-07 DIAGNOSIS — Z7901 Long term (current) use of anticoagulants: Secondary | ICD-10-CM | POA: Insufficient documentation

## 2016-02-07 DIAGNOSIS — Z9889 Other specified postprocedural states: Secondary | ICD-10-CM | POA: Diagnosis not present

## 2016-02-07 DIAGNOSIS — Z86711 Personal history of pulmonary embolism: Secondary | ICD-10-CM | POA: Insufficient documentation

## 2016-02-07 DIAGNOSIS — Z720 Tobacco use: Secondary | ICD-10-CM

## 2016-02-07 DIAGNOSIS — I4581 Long QT syndrome: Secondary | ICD-10-CM | POA: Diagnosis not present

## 2016-02-07 DIAGNOSIS — I5032 Chronic diastolic (congestive) heart failure: Secondary | ICD-10-CM | POA: Diagnosis not present

## 2016-02-07 DIAGNOSIS — M1990 Unspecified osteoarthritis, unspecified site: Secondary | ICD-10-CM | POA: Insufficient documentation

## 2016-02-07 DIAGNOSIS — J438 Other emphysema: Secondary | ICD-10-CM

## 2016-02-07 DIAGNOSIS — R10A0 Flank pain, unspecified side: Secondary | ICD-10-CM

## 2016-02-07 DIAGNOSIS — Z8679 Personal history of other diseases of the circulatory system: Secondary | ICD-10-CM

## 2016-02-07 DIAGNOSIS — R9431 Abnormal electrocardiogram [ECG] [EKG]: Secondary | ICD-10-CM

## 2016-02-07 DIAGNOSIS — Z86718 Personal history of other venous thrombosis and embolism: Secondary | ICD-10-CM | POA: Diagnosis not present

## 2016-02-07 DIAGNOSIS — R109 Unspecified abdominal pain: Secondary | ICD-10-CM

## 2016-02-07 MED ORDER — FLUTICASONE-SALMETEROL 250-50 MCG/DOSE IN AEPB: 1.0000 | INHALATION_SPRAY | Freq: Two times a day (BID) | RESPIRATORY_TRACT | 3 refills | Status: DC

## 2016-02-07 MED ORDER — CYCLOBENZAPRINE HCL 10 MG PO TABS: 10.0000 mg | ORAL_TABLET | Freq: Three times a day (TID) | ORAL | 0 refills | Status: DC | PRN

## 2016-02-07 MED ORDER — IPRATROPIUM-ALBUTEROL 0.5-2.5 (3) MG/3ML IN SOLN: 3.0000 mL | RESPIRATORY_TRACT | 3 refills | Status: DC | PRN

## 2016-02-07 NOTE — Patient Instructions (Signed)
Chronic Obstructive Pulmonary Disease Chronic obstructive pulmonary disease (COPD) is a common lung condition in which airflow from the lungs is limited. COPD is a general term that can be used to describe many different lung problems that limit airflow, including both chronic bronchitis and emphysema. If you have COPD, your lung function will probably never return to normal, but there are measures you can take to improve lung function and make yourself feel better. CAUSES   Smoking (common).  Exposure to secondhand smoke.  Genetic problems.  Chronic inflammatory lung diseases or recurrent infections. SYMPTOMS  Shortness of breath, especially with physical activity.  Deep, persistent (chronic) cough with a large amount of thick mucus.  Wheezing.  Rapid breaths (tachypnea).  Gray or bluish discoloration (cyanosis) of the skin, especially in your fingers, toes, or lips.  Fatigue.  Weight loss.  Frequent infections or episodes when breathing symptoms become much worse (exacerbations).  Chest tightness. DIAGNOSIS Your health care provider will take a medical history and perform a physical examination to diagnose COPD. Additional tests for COPD may include:  Lung (pulmonary) function tests.  Chest X-ray.  CT scan.  Blood tests. TREATMENT  Treatment for COPD may include:  Inhaler and nebulizer medicines. These help manage the symptoms of COPD and make your breathing more comfortable.  Supplemental oxygen. Supplemental oxygen is only helpful if you have a low oxygen level in your blood.  Exercise and physical activity. These are beneficial for nearly all people with COPD.  Lung surgery or transplant.  Nutrition therapy to gain weight, if you are underweight.  Pulmonary rehabilitation. This may involve working with a team of health care providers and specialists, such as respiratory, occupational, and physical therapists. HOME CARE INSTRUCTIONS  Take all medicines  (inhaled or pills) as directed by your health care provider.  Avoid over-the-counter medicines or cough syrups that dry up your airway (such as antihistamines) and slow down the elimination of secretions unless instructed otherwise by your health care provider.  If you are a smoker, the most important thing that you can do is stop smoking. Continuing to smoke will cause further lung damage and breathing trouble. Ask your health care provider for help with quitting smoking. He or she can direct you to community resources or hospitals that provide support.  Avoid exposure to irritants such as smoke, chemicals, and fumes that aggravate your breathing.  Use oxygen therapy and pulmonary rehabilitation if directed by your health care provider. If you require home oxygen therapy, ask your health care provider whether you should purchase a pulse oximeter to measure your oxygen level at home.  Avoid contact with individuals who have a contagious illness.  Avoid extreme temperature and humidity changes.  Eat healthy foods. Eating smaller, more frequent meals and resting before meals may help you maintain your strength.  Stay active, but balance activity with periods of rest. Exercise and physical activity will help you maintain your ability to do things you want to do.  Preventing infection and hospitalization is very important when you have COPD. Make sure to receive all the vaccines your health care provider recommends, especially the pneumococcal and influenza vaccines. Ask your health care provider whether you need a pneumonia vaccine.  Learn and use relaxation techniques to manage stress.  Learn and use controlled breathing techniques as directed by your health care provider. Controlled breathing techniques include:  Pursed lip breathing. Start by breathing in (inhaling) through your nose for 1 second. Then, purse your lips as if you were   going to whistle and breathe out (exhale) through the  pursed lips for 2 seconds.  Diaphragmatic breathing. Start by putting one hand on your abdomen just above your waist. Inhale slowly through your nose. The hand on your abdomen should move out. Then purse your lips and exhale slowly. You should be able to feel the hand on your abdomen moving in as you exhale.  Learn and use controlled coughing to clear mucus from your lungs. Controlled coughing is a series of short, progressive coughs. The steps of controlled coughing are: 1. Lean your head slightly forward. 2. Breathe in deeply using diaphragmatic breathing. 3. Try to hold your breath for 3 seconds. 4. Keep your mouth slightly open while coughing twice. 5. Spit any mucus out into a tissue. 6. Rest and repeat the steps once or twice as needed. SEEK MEDICAL CARE IF:  You are coughing up more mucus than usual.  There is a change in the color or thickness of your mucus.  Your breathing is more labored than usual.  Your breathing is faster than usual. SEEK IMMEDIATE MEDICAL CARE IF:  You have shortness of breath while you are resting.  You have shortness of breath that prevents you from:  Being able to talk.  Performing your usual physical activities.  You have chest pain lasting longer than 5 minutes.  Your skin color is more cyanotic than usual.  You measure low oxygen saturations for longer than 5 minutes with a pulse oximeter. MAKE SURE YOU:  Understand these instructions.  Will watch your condition.  Will get help right away if you are not doing well or get worse.   This information is not intended to replace advice given to you by your health care provider. Make sure you discuss any questions you have with your health care provider.   Document Released: 12/21/2004 Document Revised: 04/03/2014 Document Reviewed: 11/07/2012 [employer] Interactive Patient Education 2016 [employer]  

## 2016-02-07 NOTE — Progress Notes (Signed)
Transitional care clinic  Date of telephone encounter: 02/04/16  Hospitalization dates: 01/31/16 through 02/03/16  Subjective:  Patient ID: [name], male    DOB: 08/26/1960  Age: 55 y.o. MRN: [id number]  CC: Hospitalization Follow-up and Asthma   HPI [name] is 55 year old male with a history of COPD, tobacco abuse, chronic DVT/PE (on Eliquis), coronary artery disease (s/p stent, last cardiac cath 12/2014 - 2 vessel CAD, medical management recommended) who presents today after hospitalization for COPD exacerbation.  He had presented with worsening shortness of breath and wheezing despite using his home nebulizers and metered-dose inhalers. Chest x-ray was negative for infiltrate or edema. Of note he has had multiple hospitalizations for COPD exacerbation in the recent past. He was treated with nebulizers, antibiotics, IV steroids and subsequently discharged on prednisone, Symbicort and Proventil MDI.  He informs me today he has been unable to obtain his Symbicort due to the fact that his insurance does not cover it. The patient informs me that "his PCP left" and he has not been under the care of any physician of recent. He denies shortness of breath or chest pain but does have some cough and also complains of right flank pain whenever he coughs. He denies fevers.  Past Medical History:  Diagnosis Date  . Asthma   . CAD (coronary artery disease)    a. 09/2013 NSTEMI/PCI: LM nl, LAD 40-50%, D1 100 (2.25 x 28 Vision BMS), LCX min irregs, RI 60-70, 30, RCA 40-50/50-10ms/p, EF 45-50%.  b. cath 12/2014 -occulded BMS in diag, 50% pro to mid LAD, 60% ramus, 30% RCA   . Chest pain 08/2015  . Chronic diastolic CHF (congestive heart failure) ([place])    a. 09/2014 EF 45-50% by LV gram;  b. 01/2014 Echo: EF 55-60%, Gr 1 DD.  Marland Kitchen Cocaine abuse   . COPD (chronic obstructive pulmonary disease) ([place])   . DVT (deep venous thrombosis) ([place])    "I had ~ 10 in each leg"  . Essential hypertension    . Hepatitis C   . History of pneumonia   . Osteoarthritis    a. hands and toes.  . Pulmonary embolism ([place])    a. 2012 - s/p IVC filter;  b. prev on eliquis - noncompliant.  . Shortness of breath dyspnea   . Sinus headache   . Tobacco abuse     Past Surgical History:  Procedure Laterality Date  . CARDIAC CATHETERIZATION N/A 01/04/2015   Procedure: Left Heart Cath and Coronary Angiography;  Surgeon: Burnell Blanks, MD;  Location: [place] [facility];  Service: Cardiovascular;  Laterality: N/A;  . CORONARY ANGIOPLASTY WITH STENT PLACEMENT  10/21/13   BMS to D1  . LEFT HEART CATHETERIZATION WITH CORONARY ANGIOGRAM N/A 10/21/2013   Procedure: LEFT HEART CATHETERIZATION WITH CORONARY ANGIOGRAM;  Surgeon: Leonie Man, MD;  Location: [facility] CATH LAB;  Service: Cardiovascular;  Laterality: N/A;  . VENA CAVA FILTER PLACEMENT  " ~ 2012    No Known Allergies   Outpatient Medications Prior to Visit  Medication Sig Dispense Refill  . albuterol (PROVENTIL HFA;VENTOLIN HFA) 108 (90 Base) MCG/ACT inhaler Inhale 2 puffs into the lungs every 6 (six) hours as needed for wheezing or shortness of breath. 1 Inhaler 0  . ELIQUIS 2.5 MG TABS tablet Take 2 tablets (5 mg total) by mouth 2 (two) times daily. 60 tablet 0  . predniSONE (DELTASONE) 10 MG tablet Take 4 tabs for 3 days, then 3 tabs for 3 days, then 2  tabs for 3 days, then 1 tab for 3 days, then 1/2 tab for 4 days. 32 tablet 0  . amLODipine (NORVASC) 5 MG tablet Take 1 tablet (5 mg total) by mouth daily. 30 tablet 1  . nitroGLYCERIN (NITROSTAT) 0.4 MG SL tablet Place 1 tablet (0.4 mg total) under the tongue every 5 (five) minutes as needed for chest pain (CP or SOB). (Patient not taking: Reported on 02/07/2016) 30 tablet 0  . budesonide-formoterol (SYMBICORT) 160-4.5 MCG/ACT inhaler Inhale 2 puffs into the lungs 2 (two) times daily. 1 Inhaler 0  . ipratropium-albuterol (DUONEB) 0.5-2.5 (3) MG/3ML SOLN Take 3 mLs by nebulization every 4  (four) hours as needed. (Patient not taking: Reported on 02/07/2016) 360 mL 3   No facility-administered medications prior to visit.     ROS Review of Systems  Constitutional: Negative for activity change and appetite change.  HENT: Negative for sinus pressure and sore throat.   Eyes: Negative for visual disturbance.  Respiratory: Negative for cough, chest tightness and shortness of breath.   Cardiovascular: Negative for chest pain and leg swelling.  Gastrointestinal: Negative for abdominal distention, abdominal pain, constipation and diarrhea.  Endocrine: Negative.   Genitourinary: Positive for flank pain. Negative for dysuria.  Musculoskeletal: Negative for joint swelling and myalgias.  Skin: Negative for rash.  Allergic/Immunologic: Negative.   Neurological: Negative for weakness, light-headedness and numbness.  Psychiatric/Behavioral: Negative for dysphoric mood and suicidal ideas.    Objective:  BP 110/73 (BP Location: Right Arm, Patient Position: Sitting, Cuff Size: Large)   Pulse 73   Temp 98.1 F (36.7 C) (Oral)   Ht 6\' 2"  (1.88 m)   Wt 191 lb 12.8 oz (87 kg)   SpO2 99%   BMI 24.63 kg/m   BP/Weight 02/07/2016 02/03/2016 [id number]  Systolic BP [id number] -  Diastolic BP 73 76 -  Wt. (Lbs) 191.8 - 180.5  BMI 24.63 - 23.17      Physical Exam  Constitutional: He is oriented to person, place, and time. He appears well-developed and well-nourished.  Neck: No JVD present.  Cardiovascular: Normal rate, normal heart sounds and intact distal pulses.   No murmur heard. Pulmonary/Chest: Effort normal and breath sounds normal. He has no wheezes. He has no rales. He exhibits no tenderness.  Abdominal: Soft. Bowel sounds are normal. He exhibits no distension and no mass. There is no tenderness.  Musculoskeletal: Normal range of motion. He exhibits tenderness (tenderness on palp of right paraspinal muscles extending to right anterolateral lumbar region).  Neurological: He is  alert and oriented to person, place, and time.  Skin: Skin is warm and dry.  Psychiatric: He has a normal mood and affect.    CLINICAL DATA:  Shortness of breath for 2 weeks  EXAM: PORTABLE CHEST 1 VIEW  COMPARISON:  01/07/2016  FINDINGS: Semi-erect portable view chest demonstrates low lung volumes. No acute infiltrate or effusion. Cardiomediastinal silhouette normal. No pneumothorax.  IMPRESSION: No infiltrate or edema   Electronically Signed   By: Donavan Foil M.D.   On: 02/01/2016 00:27  Assessment & Plan:   1. History of DVT (deep vein thrombosis) Currently on Eliquis  2. History of coronary artery disease S/p stent  2 vessel CAD from cardiac cath from 12/2014 - medical management recommended Risk factor modification We will discuss initiation of statin at his next visit as his cardiovascular risk is high  3. Other emphysema ([place]) Acute flare is resolved Switch from Symbicort to Advair due to formulary issues Advised that  smoking cessation will help retard progression of condition - ipratropium-albuterol (DUONEB) 0.5-2.5 (3) MG/3ML SOLN; Take 3 mLs by nebulization every 4 (four) hours as needed.  Dispense: 360 mL; Refill: 3 - Fluticasone-Salmeterol (ADVAIR DISKUS) 250-50 MCG/DOSE AEPB; Inhale 1 puff into the lungs 2 (two) times daily.  Dispense: 1 each; Refill: 3  4. Tobacco abuse Spent 3 minutes counseling on cessation and he is not willing to quit  5. QT prolongation Chronic  6. Flank pain Likely muscle spasm Advised against drinking alcohol and taking muscle relaxant - cyclobenzaprine (FLEXERIL) 10 MG tablet; Take 1 tablet (10 mg total) by mouth 3 (three) times daily as needed for muscle spasms.  Dispense: 30 tablet; Refill: 0   Meds ordered this encounter  Medications  . ipratropium-albuterol (DUONEB) 0.5-2.5 (3) MG/3ML SOLN    Sig: Take 3 mLs by nebulization every 4 (four) hours as needed.    Dispense:  360 mL    Refill:  3  .  cyclobenzaprine (FLEXERIL) 10 MG tablet    Sig: Take 1 tablet (10 mg total) by mouth 3 (three) times daily as needed for muscle spasms.    Dispense:  30 tablet    Refill:  0  . Fluticasone-Salmeterol (ADVAIR DISKUS) 250-50 MCG/DOSE AEPB    Sig: Inhale 1 puff into the lungs 2 (two) times daily.    Dispense:  1 each    Refill:  3    Follow-up: Return in about 2 weeks (around 02/21/2016) for TCC: follow up on COPD.   Arnoldo Morale MD

## 2016-02-15 ENCOUNTER — Telehealth: Payer: Self-pay

## 2016-02-15 NOTE — Telephone Encounter (Signed)
This Case Manager received return call from patient. Inquired about patient's status. Patient indicated he was "hanging in there" and denied any health concerns at this time. Discussed importance of medication compliance, and patient indicated he was being compliant with his medications. Discussed need for Transitional Care follow-up appointment, and patient agreeable. Appointment scheduled for 02/22/16 at 1000 with Dr. Jarold Song. No additional needs/concerns identified at this time.

## 2016-02-15 NOTE — Telephone Encounter (Signed)
This Case Manager placed call to patient to check status and to discuss scheduling a Transitional Care follow-up appointment. Call placed to [phone]; however, unable to reach patient. HIPPA compliant voicemail left requesting return call.

## 2016-02-21 ENCOUNTER — Telehealth: Payer: Self-pay

## 2016-02-21 NOTE — Telephone Encounter (Signed)
Call placed to the patient and confirmed his appointment with Dr Jarold Song tomorrow - 02/22/16 @ 1000.  He also confirmed that he has transportation to the clinic. Reminded him to bring his medications with him to his appointment and he said that he already did that last time and is still taking everything as ordered.  He stated that he is feeling " all right": except that he still has  occasional back pain when he coughs, which is not a new problem.  No other questions/concerns reported at this time.

## 2016-02-22 ENCOUNTER — Encounter: Payer: Self-pay | Admitting: Family Medicine

## 2016-02-22 ENCOUNTER — Ambulatory Visit: Payer: Medicare Other | Attending: Family Medicine | Admitting: Family Medicine

## 2016-02-22 VITALS — BP 109/81 | HR 111 | Temp 97.7°F | Ht 74.0 in | Wt 187.6 lb

## 2016-02-22 DIAGNOSIS — Z955 Presence of coronary angioplasty implant and graft: Secondary | ICD-10-CM | POA: Insufficient documentation

## 2016-02-22 DIAGNOSIS — I2510 Atherosclerotic heart disease of native coronary artery without angina pectoris: Secondary | ICD-10-CM | POA: Insufficient documentation

## 2016-02-22 DIAGNOSIS — Z86711 Personal history of pulmonary embolism: Secondary | ICD-10-CM | POA: Diagnosis not present

## 2016-02-22 DIAGNOSIS — Z86718 Personal history of other venous thrombosis and embolism: Secondary | ICD-10-CM | POA: Diagnosis not present

## 2016-02-22 DIAGNOSIS — I110 Hypertensive heart disease with heart failure: Secondary | ICD-10-CM | POA: Insufficient documentation

## 2016-02-22 DIAGNOSIS — J441 Chronic obstructive pulmonary disease with (acute) exacerbation: Secondary | ICD-10-CM | POA: Diagnosis not present

## 2016-02-22 DIAGNOSIS — R109 Unspecified abdominal pain: Secondary | ICD-10-CM

## 2016-02-22 DIAGNOSIS — Z8679 Personal history of other diseases of the circulatory system: Secondary | ICD-10-CM | POA: Diagnosis not present

## 2016-02-22 DIAGNOSIS — I252 Old myocardial infarction: Secondary | ICD-10-CM | POA: Diagnosis not present

## 2016-02-22 DIAGNOSIS — I4581 Long QT syndrome: Secondary | ICD-10-CM | POA: Insufficient documentation

## 2016-02-22 DIAGNOSIS — M1990 Unspecified osteoarthritis, unspecified site: Secondary | ICD-10-CM | POA: Diagnosis not present

## 2016-02-22 DIAGNOSIS — B1920 Unspecified viral hepatitis C without hepatic coma: Secondary | ICD-10-CM | POA: Diagnosis not present

## 2016-02-22 DIAGNOSIS — Z9889 Other specified postprocedural states: Secondary | ICD-10-CM | POA: Diagnosis not present

## 2016-02-22 DIAGNOSIS — Z720 Tobacco use: Secondary | ICD-10-CM

## 2016-02-22 DIAGNOSIS — Z7901 Long term (current) use of anticoagulants: Secondary | ICD-10-CM | POA: Insufficient documentation

## 2016-02-22 DIAGNOSIS — J438 Other emphysema: Secondary | ICD-10-CM | POA: Diagnosis not present

## 2016-02-22 DIAGNOSIS — I5032 Chronic diastolic (congestive) heart failure: Secondary | ICD-10-CM | POA: Diagnosis not present

## 2016-02-22 DIAGNOSIS — Z79899 Other long term (current) drug therapy: Secondary | ICD-10-CM | POA: Diagnosis not present

## 2016-02-22 MED ORDER — CYCLOBENZAPRINE HCL 10 MG PO TABS: 10.0000 mg | ORAL_TABLET | Freq: Two times a day (BID) | ORAL | 1 refills | Status: DC | PRN

## 2016-02-22 MED ORDER — ATORVASTATIN CALCIUM 40 MG PO TABS: 40.0000 mg | ORAL_TABLET | Freq: Every day | ORAL | 3 refills | Status: DC

## 2016-02-22 NOTE — Progress Notes (Signed)
  Needs med refill on Flexeril

## 2016-02-22 NOTE — Progress Notes (Signed)
Transitional care clinic  Date of telephone encounter: 02/04/16  Hospitalization dates: 01/31/16 through 02/03/16  Subjective:    Patient ID: [name], male    DOB: 1960/04/17, 55 y.o.   MRN: [id number]  HPI 55 year old male with a history of COPD, previous DVT and PE (on anticoagulation with Eliquis), coronary artery disease (status post stent, last cardiac cath from 12/2014 revealed two-vessel CAD, medical management recommended), tobacco abuse who presents today for a follow-up visit. At his last office visit he was seen for hospital follow-up for COPD exacerbation.  He has picked up his Advair but reports not taking it today; he rarely has to use his nebulizer device. Requests a refill of Flexeril which he takes for muscle spasms in his back which occur when he coughs.  He is requesting a letter to indicate he can donate plasma.  Declines referral for colonoscopy.  Past Medical History:  Diagnosis Date  . Asthma   . CAD (coronary artery disease)    a. 09/2013 NSTEMI/PCI: LM nl, LAD 40-50%, D1 100 (2.25 x 28 Vision BMS), LCX min irregs, RI 60-70, 30, RCA 40-50/50-44ms/p, EF 45-50%.  b. cath 12/2014 -occulded BMS in diag, 50% pro to mid LAD, 60% ramus, 30% RCA   . Chest pain 08/2015  . Chronic diastolic CHF (congestive heart failure) ([place])    a. 09/2014 EF 45-50% by LV gram;  b. 01/2014 Echo: EF 55-60%, Gr 1 DD.  Marland Kitchen Cocaine abuse   . COPD (chronic obstructive pulmonary disease) ([place])   . DVT (deep venous thrombosis) ([place])    "I had ~ 10 in each leg"  . Essential hypertension   . Hepatitis C   . History of pneumonia   . Osteoarthritis    a. hands and toes.  . Pulmonary embolism ([place])    a. 2012 - s/p IVC filter;  b. prev on eliquis - noncompliant.  . Shortness of breath dyspnea   . Sinus headache   . Tobacco abuse     Past Surgical History:  Procedure Laterality Date  . CARDIAC CATHETERIZATION N/A 01/04/2015   Procedure: Left Heart Cath and Coronary Angiography;   Surgeon: Burnell Blanks, MD;  Location: [place] [facility];  Service: Cardiovascular;  Laterality: N/A;  . CORONARY ANGIOPLASTY WITH STENT PLACEMENT  10/21/13   BMS to D1  . LEFT HEART CATHETERIZATION WITH CORONARY ANGIOGRAM N/A 10/21/2013   Procedure: LEFT HEART CATHETERIZATION WITH CORONARY ANGIOGRAM;  Surgeon: Leonie Man, MD;  Location: [facility] CATH LAB;  Service: Cardiovascular;  Laterality: N/A;  . VENA CAVA FILTER PLACEMENT  " ~ 2012    No Known Allergies  Current Outpatient Prescriptions on File Prior to Visit  Medication Sig Dispense Refill  . albuterol (PROVENTIL HFA;VENTOLIN HFA) 108 (90 Base) MCG/ACT inhaler Inhale 2 puffs into the lungs every 6 (six) hours as needed for wheezing or shortness of breath. 1 Inhaler 0  . ELIQUIS 2.5 MG TABS tablet Take 2 tablets (5 mg total) by mouth 2 (two) times daily. 60 tablet 0  . Fluticasone-Salmeterol (ADVAIR DISKUS) 250-50 MCG/DOSE AEPB Inhale 1 puff into the lungs 2 (two) times daily. 1 each 3  . ipratropium-albuterol (DUONEB) 0.5-2.5 (3) MG/3ML SOLN Take 3 mLs by nebulization every 4 (four) hours as needed. (Patient not taking: Reported on 02/22/2016) 360 mL 3  . nitroGLYCERIN (NITROSTAT) 0.4 MG SL tablet Place 1 tablet (0.4 mg total) under the tongue every 5 (five) minutes as needed for chest pain (CP or SOB). (Patient not  taking: Reported on 02/22/2016) 30 tablet 0   No current facility-administered medications on file prior to visit.      Review of Systems  Constitutional: Negative for activity change and appetite change.  HENT: Negative for sinus pressure and sore throat.   Eyes: Negative for visual disturbance.  Respiratory: Negative for cough, chest tightness and shortness of breath.   Cardiovascular: Negative for chest pain and leg swelling.  Gastrointestinal: Negative for abdominal distention, abdominal pain, constipation and diarrhea.  Endocrine: Negative.   Genitourinary: Negative for dysuria.  Musculoskeletal:  Positive for back pain. Negative for joint swelling and myalgias.  Skin: Negative for rash.  Allergic/Immunologic: Negative.   Neurological: Negative for weakness, light-headedness and numbness.  Psychiatric/Behavioral: Negative for dysphoric mood and suicidal ideas.       Objective: Vitals:   02/22/16 0952  BP: 109/81  Pulse: (!) 111  Temp: 97.7 F (36.5 C)  TempSrc: Oral  SpO2: 99%  Weight: 187 lb 9.6 oz (85.1 kg)  Height: 6\' 2"  (1.88 m)      Physical Exam  Constitutional: He is oriented to person, place, and time. He appears well-developed and well-nourished.  Cardiovascular: Normal heart sounds and intact distal pulses.  Tachycardia present.   No murmur heard. Pulmonary/Chest: Effort normal. He has wheezes (slight bilateral expiratory wheezing in upper lung fields). He has no rales. He exhibits no tenderness.  Abdominal: Soft. Bowel sounds are normal. He exhibits no distension and no mass. There is no tenderness.  Musculoskeletal: Normal range of motion.  Neurological: He is alert and oriented to person, place, and time.  Skin: Skin is warm and dry.  Psychiatric: He has a normal mood and affect.          Assessment & Plan:  1. History of DVT (deep vein thrombosis) Currently on Eliquis  2. History of coronary artery disease S/p stent  2 vessel CAD from cardiac cath from 12/2014 - medical management recommended Risk factor modification  initiation of statin as his cardiovascular risk is high  3. Other emphysema (H[facility]) Acute flare is resolved - current wheezing due to the fact that he is yet to take his Advair today. Continue Advair Advised that smoking cessation will help retard progression of condition - ipratropium-albuterol (DUONEB) 0.5-2.5 (3) MG/3ML SOLN; Take 3 mLs by nebulization every 4 (four) hours as needed.  Dispense: 360 mL; Refill: 3 - Fluticasone-Salmeterol (ADVAIR DISKUS) 250-50 MCG/DOSE AEPB; Inhale 1 puff into the lungs 2 (two) times daily.   Dispense: 1 each; Refill: 3  4. Tobacco abuse Spent 3 minutes counseling on cessation and he is not willing to quit  5. QT prolongation Chronic  6. Flank pain Likely muscle spasm Advised against drinking alcohol and taking muscle relaxant - cyclobenzaprine (FLEXERIL) 10 MG tablet; Take 1 tablet (10 mg total) by mouth 3 (three) times daily as needed for muscle spasms.  Dispense: 30 tablet; Refill: 0

## 2016-03-28 ENCOUNTER — Encounter: Payer: Self-pay | Admitting: Internal Medicine

## 2016-03-28 ENCOUNTER — Ambulatory Visit: Payer: Medicare Other | Attending: Family Medicine | Admitting: Family Medicine

## 2016-03-28 ENCOUNTER — Encounter: Payer: Self-pay | Admitting: Family Medicine

## 2016-03-28 ENCOUNTER — Ambulatory Visit (INDEPENDENT_AMBULATORY_CARE_PROVIDER_SITE_OTHER): Payer: Medicare Other | Admitting: Internal Medicine

## 2016-03-28 VITALS — BP 109/72 | HR 113 | Temp 97.5°F | Ht 74.0 in | Wt 194.8 lb

## 2016-03-28 VITALS — BP 128/87 | HR 109 | Temp 97.6°F | Ht 74.0 in | Wt 193.0 lb

## 2016-03-28 DIAGNOSIS — R000 Tachycardia, unspecified: Secondary | ICD-10-CM | POA: Insufficient documentation

## 2016-03-28 DIAGNOSIS — B182 Chronic viral hepatitis C: Secondary | ICD-10-CM | POA: Diagnosis not present

## 2016-03-28 DIAGNOSIS — I209 Angina pectoris, unspecified: Secondary | ICD-10-CM | POA: Diagnosis not present

## 2016-03-28 DIAGNOSIS — J439 Emphysema, unspecified: Secondary | ICD-10-CM | POA: Diagnosis not present

## 2016-03-28 DIAGNOSIS — I25119 Atherosclerotic heart disease of native coronary artery with unspecified angina pectoris: Secondary | ICD-10-CM | POA: Diagnosis not present

## 2016-03-28 DIAGNOSIS — R229 Localized swelling, mass and lump, unspecified: Secondary | ICD-10-CM | POA: Diagnosis not present

## 2016-03-28 DIAGNOSIS — Z79899 Other long term (current) drug therapy: Secondary | ICD-10-CM | POA: Diagnosis not present

## 2016-03-28 DIAGNOSIS — R946 Abnormal results of thyroid function studies: Secondary | ICD-10-CM | POA: Diagnosis not present

## 2016-03-28 DIAGNOSIS — Z86718 Personal history of other venous thrombosis and embolism: Secondary | ICD-10-CM | POA: Insufficient documentation

## 2016-03-28 DIAGNOSIS — R609 Edema, unspecified: Secondary | ICD-10-CM | POA: Diagnosis not present

## 2016-03-28 DIAGNOSIS — K7400 Hepatic fibrosis, unspecified: Secondary | ICD-10-CM

## 2016-03-28 DIAGNOSIS — R21 Rash and other nonspecific skin eruption: Secondary | ICD-10-CM

## 2016-03-28 DIAGNOSIS — J438 Other emphysema: Secondary | ICD-10-CM

## 2016-03-28 DIAGNOSIS — H6123 Impacted cerumen, bilateral: Secondary | ICD-10-CM | POA: Diagnosis not present

## 2016-03-28 MED ORDER — ATORVASTATIN CALCIUM 40 MG PO TABS: 40.0000 mg | ORAL_TABLET | Freq: Every day | ORAL | 3 refills | Status: DC

## 2016-03-28 MED ORDER — FLUTICASONE-SALMETEROL 250-50 MCG/DOSE IN AEPB: 1.0000 | INHALATION_SPRAY | Freq: Two times a day (BID) | RESPIRATORY_TRACT | 3 refills | Status: DC

## 2016-03-28 MED ORDER — ELIQUIS 2.5 MG PO TABS: 5.0000 mg | ORAL_TABLET | Freq: Two times a day (BID) | ORAL | 0 refills | Status: DC

## 2016-03-28 MED ORDER — ALBUTEROL SULFATE HFA 108 (90 BASE) MCG/ACT IN AERS: 2.0000 | INHALATION_SPRAY | Freq: Four times a day (QID) | RESPIRATORY_TRACT | 0 refills | Status: DC | PRN

## 2016-03-28 MED ORDER — CARBAMIDE PEROXIDE 6.5 % OT SOLN: 5.0000 [drp] | Freq: Two times a day (BID) | OTIC | 0 refills | Status: DC

## 2016-03-28 NOTE — Assessment & Plan Note (Signed)
Test of cure today and if negative, no follow up needed

## 2016-03-28 NOTE — Progress Notes (Signed)
Advair,eliquis,albuteral, lipitor, amlodipine refills needed

## 2016-03-28 NOTE — Progress Notes (Signed)
   Subjective:    Patient ID: [name], male    DOB: 06-Aug-1960, 56 y.o.   MRN: [id number]  HPI Here for follow up of HCV.  Genotype 1b, viral load 2.3 million, elastography with F2/3.  Pleased that he only had about 2 shots for the new year, and otherwise is not drinking.  Completed [place] about 1 year ago and here for SVR testing.     Review of Systems  Constitutional: Negative for fatigue.  Skin: Negative for rash.  Neurological: Negative for dizziness and light-headedness.       Objective:   Physical Exam  Constitutional: He appears well-developed and well-nourished. No distress.  Eyes: No scleral icterus.  Cardiovascular: Normal rate, regular rhythm and normal heart sounds.   No murmur heard. Pulmonary/Chest: Effort normal and breath sounds normal. No respiratory distress.  Skin: No rash noted.          Assessment & Plan:

## 2016-03-28 NOTE — Progress Notes (Addendum)
Subjective:  Patient ID: [name], male    DOB: 1960-06-12  Age: 56 y.o. MRN: [id number]  CC: Asthma; Cerumen Impaction; bed bug (staying on a rooming house- bites all over); and Mass (mid back)   HPI [name] 56 year old male with a history of COPD, previous DVT and PE (on anticoagulation with Eliquis), coronary artery disease (status post stent, last cardiac cath from 12/2014 revealed two-vessel CAD, medical management recommended), tobacco abuse who presents today for a follow-up visit.  He requests a referral to dermatology due to a chronic rash on his back which he has had for years; describes rash as pruritic and worsening during the hot months. He also requests referral to have excision of lumps on his back which are bothersome. Lesions have increased slightly but he denies tenderness.  He complains of shortness of breath even when he has to take out the garbage can but denies chest pains,pedal edema, wheezing and states he thinks his COPD is well controlled. 2-D echo from 12/2014 reveals EF of 0000000, grade 1 diastolic dysfunction. He also complains of ears feeling stopped up.   Past Medical History:  Diagnosis Date  . Asthma   . CAD (coronary artery disease)    a. 09/2013 NSTEMI/PCI: LM nl, LAD 40-50%, D1 100 (2.25 x 28 Vision BMS), LCX min irregs, RI 60-70, 30, RCA 40-50/50-77ms/p, EF 45-50%.  b. cath 12/2014 -occulded BMS in diag, 50% pro to mid LAD, 60% ramus, 30% RCA   . Chest pain 08/2015  . Chronic diastolic CHF (congestive heart failure) ([place])    a. 09/2014 EF 45-50% by LV gram;  b. 01/2014 Echo: EF 55-60%, Gr 1 DD.  Marland Kitchen Cocaine abuse   . COPD (chronic obstructive pulmonary disease) ([place])   . DVT (deep venous thrombosis) ([place])    "I had ~ 10 in each leg"  . Essential hypertension   . Hepatitis C   . History of pneumonia   . Osteoarthritis    a. hands and toes.  . Pulmonary embolism ([place])    a. 2012 - s/p IVC filter;  b. prev on eliquis - noncompliant.  .  Shortness of breath dyspnea   . Sinus headache   . Tobacco abuse     Past Surgical History:  Procedure Laterality Date  . CARDIAC CATHETERIZATION N/A 01/04/2015   Procedure: Left Heart Cath and Coronary Angiography;  Surgeon: Burnell Blanks, MD;  Location: [place] [facility];  Service: Cardiovascular;  Laterality: N/A;  . CORONARY ANGIOPLASTY WITH STENT PLACEMENT  10/21/13   BMS to D1  . LEFT HEART CATHETERIZATION WITH CORONARY ANGIOGRAM N/A 10/21/2013   Procedure: LEFT HEART CATHETERIZATION WITH CORONARY ANGIOGRAM;  Surgeon: Leonie Man, MD;  Location[facility];  Service: Cardiovascular;  Laterality: N/A;  . VENA CAVA FILTER PLACEMENT  " ~ 2012    No Known Allergies   Outpatient Medications Prior to Visit  Medication Sig Dispense Refill  . cyclobenzaprine (FLEXERIL) 10 MG tablet Take 1 tablet (10 mg total) by mouth 2 (two) times daily as needed for muscle spasms. 60 tablet 1  . albuterol (PROVENTIL HFA;VENTOLIN HFA) 108 (90 Base) MCG/ACT inhaler Inhale 2 puffs into the lungs every 6 (six) hours as needed for wheezing or shortness of breath. 1 Inhaler 0  . atorvastatin (LIPITOR) 40 MG tablet Take 1 tablet (40 mg total) by mouth daily. 30 tablet 3  . ELIQUIS 2.5 MG TABS tablet Take 2 tablets (5 mg total) by mouth 2 (two) times daily.  60 tablet 0  . Fluticasone-Salmeterol (ADVAIR DISKUS) 250-50 MCG/DOSE AEPB Inhale 1 puff into the lungs 2 (two) times daily. 1 each 3  . ipratropium-albuterol (DUONEB) 0.5-2.5 (3) MG/3ML SOLN Take 3 mLs by nebulization every 4 (four) hours as needed. (Patient not taking: Reported on 03/28/2016) 360 mL 3  . nitroGLYCERIN (NITROSTAT) 0.4 MG SL tablet Place 1 tablet (0.4 mg total) under the tongue every 5 (five) minutes as needed for chest pain (CP or SOB). (Patient not taking: Reported on 03/28/2016) 30 tablet 0   No facility-administered medications prior to visit.     ROS Review of Systems  Constitutional: Negative for activity change and  appetite change.  HENT: Negative for sinus pressure and sore throat.   Eyes: Negative for visual disturbance.  Respiratory: Positive for shortness of breath. Negative for cough and chest tightness.   Cardiovascular: Negative for chest pain and leg swelling.  Gastrointestinal: Negative for abdominal distention, abdominal pain, constipation and diarrhea.  Endocrine: Negative.   Genitourinary: Negative for dysuria.  Musculoskeletal: Negative for joint swelling and myalgias.  Skin: Positive for rash.  Allergic/Immunologic: Negative.   Neurological: Negative for weakness, light-headedness and numbness.  Psychiatric/Behavioral: Negative for dysphoric mood and suicidal ideas.    Objective:  BP 109/72 (BP Location: Right Arm, Patient Position: Sitting, Cuff Size: Large)   Pulse (!) 113   Temp 97.5 F (36.4 C) (Oral)   Ht 6\' 2"  (1.88 m)   Wt 194 lb 12.8 oz (88.4 kg)   SpO2 99%   BMI 25.01 kg/m   BP/Weight 03/28/2016 02/22/2016 [id number]  Systolic BP 0000000 0000000 [id number]  Diastolic BP 72 81 73  Wt. (Lbs) 194.8 187.6 191.8  BMI 25.01 24.09 24.63      Physical Exam  Constitutional: He is oriented to person, place, and time. He appears well-developed and well-nourished.  HENT:  Cerumen obscuring both tympanic membranes  Cardiovascular: Normal heart sounds and intact distal pulses.  Tachycardia present.   No murmur heard. Pulmonary/Chest: Effort normal and breath sounds normal. He has no wheezes. He has no rales. He exhibits no tenderness.  Abdominal: Soft. Bowel sounds are normal. He exhibits no distension and no mass. There is no tenderness.  Musculoskeletal: Normal range of motion.  Neurological: He is alert and oriented to person, place, and time.  Skin:  2 soft tissue swelling on back which is fluctuant and nontender, no discharge Hyperpigmented rash diffusely distributed on back with scabs over most of them   Lab Results  Component Value Date   TSH 0.548 12/01/2014     Assessment  & Plan:   1. Other emphysema ([place]) No acute exacerbation Continues to complain of shortness of breath even though he is denying exacerbation of emphysema -  oxygen saturation is 99% - Fluticasone-Salmeterol (ADVAIR DISKUS) 250-50 MCG/DOSE AEPB; Inhale 1 puff into the lungs 2 (two) times daily.  Dispense: 1 each; Refill: 3 - albuterol (PROVENTIL HFA;VENTOLIN HFA) 108 (90 Base) MCG/ACT inhaler; Inhale 2 puffs into the lungs every 6 (six) hours as needed for wheezing or shortness of breath.  Dispense: 1 Inhaler; Refill: 0  2. Tachycardia Previously abnormal TSH in the past - TSH  3. Rash and nonspecific skin eruption - Ambulatory referral to Dermatology  4. Soft tissue swelling - Ambulatory referral to General Surgery  5. Bilateral impacted cerumen - carbamide peroxide (DEBROX) 6.5 % otic solution; Place 5 drops into both ears 2 (two) times daily.  Dispense: 15 mL; Refill: 0  6. History of DVT (deep  vein thrombosis) Continue to Eliquis  7. Coronary artery disease involving native coronary artery of native heart with angina pectoris [facility]) Status post stent Two-vessel CAD from cardiac cath from 12/2014 Medical management Risk factor modification   Meds ordered this encounter  Medications  . Fluticasone-Salmeterol (ADVAIR DISKUS) 250-50 MCG/DOSE AEPB    Sig: Inhale 1 puff into the lungs 2 (two) times daily.    Dispense:  1 each    Refill:  3  . ELIQUIS 2.5 MG TABS tablet    Sig: Take 2 tablets (5 mg total) by mouth 2 (two) times daily.    Dispense:  60 tablet    Refill:  0  . atorvastatin (LIPITOR) 40 MG tablet    Sig: Take 1 tablet (40 mg total) by mouth daily.    Dispense:  30 tablet    Refill:  3  . albuterol (PROVENTIL HFA;VENTOLIN HFA) 108 (90 Base) MCG/ACT inhaler    Sig: Inhale 2 puffs into the lungs every 6 (six) hours as needed for wheezing or shortness of breath.    Dispense:  1 Inhaler    Refill:  0  . carbamide peroxide (DEBROX) 6.5 % otic solution    Sig:  Place 5 drops into both ears 2 (two) times daily.    Dispense:  15 mL    Refill:  0    Follow-up: Return in about 6 weeks (around 05/09/2016) for Follow-up of shortness of breath.   Arnoldo Morale MD

## 2016-03-28 NOTE — Assessment & Plan Note (Signed)
Moderate fibrosis.  I emphasized again avoidance of alcohol.  No HCC screening indicated at this time.  rtc prn

## 2016-03-29 ENCOUNTER — Encounter: Payer: Self-pay | Admitting: Family Medicine

## 2016-03-29 ENCOUNTER — Telehealth: Payer: Self-pay

## 2016-03-29 LAB — COMPLETE METABOLIC PANEL WITH GFR
ALK PHOS: 83 U/L (ref 40–115)
ALT: 17 U/L (ref 9–46)
AST: 19 U/L (ref 10–35)
Albumin: 3.7 g/dL (ref 3.6–5.1)
BILIRUBIN TOTAL: 0.3 mg/dL (ref 0.2–1.2)
BUN: 15 mg/dL (ref 7–25)
CO2: 24 mmol/L (ref 20–31)
Calcium: 9 mg/dL (ref 8.6–10.3)
Chloride: 109 mmol/L (ref 98–110)
Creat: 0.82 mg/dL (ref 0.70–1.33)
GFR, Est African American: >89 mL/min (ref >=60)
GFR, Est Non African American: >89 mL/min (ref >=60)
Glucose, Bld: 89 mg/dL (ref 65–99)
Potassium: 4.3 mmol/L (ref 3.5–5.3)
Sodium: 141 mmol/L (ref 135–146)
TOTAL PROTEIN: 6.6 g/dL (ref 6.1–8.1)

## 2016-03-29 LAB — TSH: TSH: 1.33 mIU/L (ref 0.40–4.50)

## 2016-03-29 NOTE — Telephone Encounter (Signed)
-----   Message from Arnoldo Morale, MD sent at 03/29/2016  8:44 AM EST ----- Please inform the patient that labs are normal. Thank you.

## 2016-03-29 NOTE — Telephone Encounter (Signed)
Writer called patient per Dr. Jarold Song regarding lab results and LVM on personnel cell.  Encouraged patient to call with questions.

## 2016-03-30 LAB — HEPATITIS C RNA QUANTITATIVE: HCV Quantitative: NOT DETECTED IU/mL (ref <15)

## 2016-04-03 ENCOUNTER — Telehealth: Payer: Self-pay | Admitting: *Deleted

## 2016-04-03 NOTE — Telephone Encounter (Signed)
Patient notified. Myrtis Hopping CMA

## 2016-04-03 NOTE — Telephone Encounter (Signed)
-----   Message from Thayer Headings, MD sent at 03/30/2016  2:16 PM EST ----- Please let him know his final HCV viral load is negative and he is considered cured. thanks

## 2016-04-11 ENCOUNTER — Ambulatory Visit: Payer: Self-pay | Admitting: Surgery

## 2016-04-11 DIAGNOSIS — L723 Sebaceous cyst: Secondary | ICD-10-CM | POA: Diagnosis not present

## 2016-04-11 NOTE — H&P (Signed)
History of Present Illness [name]. [name] Salguero MD; 04/11/2016 3:33 PM) Patient words: cyst back.  The patient is a 56 year old male who presents with a complaint of Mass. This is a 56 year old male with a history of coronary artery disease and COPD who presents with about 15 years of a slowly enlarging mass on his upper back. The patient has a lot of skin lesions all over his back but this mass has become relatively large and uncomfortable. It has never become infected. He presents now for evaluation for removal.  The patient has a history of PE and is anticoagulated Eliquis   Past Surgical History ([name] Eversole, LPN; 624THL D34-534 PM) No pertinent past surgical history  Diagnostic Studies History ([name] Eversole, LPN; 624THL D34-534 PM) Colonoscopy never  Allergies ([name] Eversole, LPN; 624THL [id number] PM) No Known Drug Allergies 04/11/2016  Medication History ([name] Eversole, LPN; 624THL D34-534 PM) Advair Diskus (250-50MCG/DOSE Aero Pow Br Act, Inhalation) Active. Atorvastatin Calcium (40MG  Tablet, Oral) Active. Eliquis (5MG  Tablet, Oral) Active. Proventil HFA (108 (90 Base)MCG/ACT Aerosol Soln, Inhalation) Active. Medications Reconciled  Social History ([name] Borer, LPN; 624THL D34-534 PM) Alcohol use Occasional alcohol use. Illicit drug use Remotely quit drug use. No caffeine use Tobacco use Current some day smoker.  Family History [name] Borer, LPN; 624THL D34-534 PM) Arthritis Father, Mother.  Other Problems ([name] Eversole, LPN; 624THL D34-534 PM) Asthma Back Pain Chronic Obstructive Lung Disease Hepatitis Myocardial infarction     Review of Systems ([name] Eversole LPN; 624THL D34-534 PM) General Not Present- Appetite Loss, Chills, Fatigue, Fever, Night Sweats, Weight Gain and Weight Loss. Skin Present- Dryness and New Lesions. Not Present- Change in Wart/Mole, Hives, Jaundice, Non-Healing Wounds, Rash and Ulcer. HEENT Present- Hearing  Loss and Seasonal Allergies. Not Present- Earache, Hoarseness, Nose Bleed, Oral Ulcers, Ringing in the Ears, Sinus Pain, Sore Throat, Visual Disturbances, Wears glasses/contact lenses and Yellow Eyes. Respiratory Present- Chronic Cough, Difficulty Breathing and Wheezing. Not Present- Bloody sputum and Snoring. Breast Not Present- Breast Mass, Breast Pain, Nipple Discharge and Skin Changes. Cardiovascular Present- Leg Cramps and Shortness of Breath. Not Present- Chest Pain, Difficulty Breathing Lying Down, Palpitations, Rapid Heart Rate and Swelling of Extremities. Gastrointestinal Present- Difficulty Swallowing and Rectal Pain. Not Present- Abdominal Pain, Bloating, Bloody Stool, Change in Bowel Habits, Chronic diarrhea, Constipation, Excessive gas, Gets full quickly at meals, Hemorrhoids, Indigestion, Nausea and Vomiting. Male Genitourinary Not Present- Blood in Urine, Change in Urinary Stream, Frequency, Impotence, Nocturia, Painful Urination, Urgency and Urine Leakage. Musculoskeletal Present- Back Pain and Muscle Pain. Not Present- Joint Pain, Joint Stiffness, Muscle Weakness and Swelling of Extremities. Neurological Present- Trouble walking and Weakness. Not Present- Decreased Memory, Fainting, Headaches, Numbness, Seizures, Tingling and Tremor. Psychiatric Not Present- Anxiety, Bipolar, Change in Sleep Pattern, Depression, Fearful and Frequent crying. Endocrine Not Present- Cold Intolerance, Excessive Hunger, Hair Changes, Heat Intolerance, Hot flashes and New Diabetes. Hematology Present- Blood Thinners. Not Present- Easy Bruising, Excessive bleeding, Gland problems, HIV and Persistent Infections.  Vitals ([name] Eversole LPN; 624THL [id number] PM) 04/11/2016 2:02 PM Weight: 188.4 lb Height: 74in Body Surface Area: 2.12 m Body Mass Index: 24.19 kg/m  Temp.: 98.45F(Oral)  Pulse: 58 (Regular)  BP: 132/70 (Sitting, Left Arm, Standard)      Physical Exam [name] Key K. Nalia Honeycutt MD;  04/11/2016 3:33 PM)  The physical exam findings are as follows: Note:WDWN in NAD Back - diffuse scarring from chronic dermatitis Upper midline back - 3 cm protruding soft subcutaneous mass with central punctate opening; no drainage; no  cellulitis    Assessment & Plan [name]. [name] Lanzo MD; 04/11/2016 2:28 PM)  SEBACEOUS CYST (L72.3) Impression: 3 cm; upper back midline  Current Plans Schedule for Surgery - Excision of sebaceous cyst - upper back. The surgical procedure has been discussed with the patient. Potential risks, benefits, alternative treatments, and expected outcomes have been explained. All of the patient's questions at this time have been answered. The likelihood of reaching the patient's treatment goal is good. The patient understand the proposed surgical procedure and wishes to proceed.   Cardiac clearance by Dr. Murrell [name]. [name] Dover, MD, [facility] Surgery  General/ Trauma Surgery  04/11/2016 3:34 PM

## 2016-04-24 NOTE — Progress Notes (Signed)
Cardiology Office Note    Date:  04/25/2016   ID:  [name], DOB 13-Jan-1961, MRN [id number]  PCP:  Arnoldo Morale, MD  Cardiologist: Previously Dr. Aundra Dubin - Seen in Consult by Dr. Acie Fredrickson in 08/2015  Chief Complaint  Patient presents with  . Pre-op Exam    History of Present Illness:    [name] is a 56 y.o. male with past medical history of CAD (s/p BMS to D1 in 2015, noted to be chronically occluded by cath in 12/2014 with scattered 30% RCA stenosis, 20% LCx, and 50% Prox-Mid LAD stenosis at that time), PE/DVT (s/p IVC filter placement in 2012), COPD, chronic diastolic CHF (EF 0000000 by echo in 12/2014), HTN, HLD, chronic Hepatitis C, substance use (Cocaine - UDS from 01/2016 positive), and tobacco use who presents to the office today for cardiac clearance.  Was last seen by Cardiology during a hospital admission in 08/2015 for evaluation of chest pain. Cyclic troponin values were negative and his EKG was without acute ischemic changes. A stress test was obtained at that time which showed a small defect along the LAD territory consistent with his known D1 occlusion. Continued medical therapy was recommended at that time.   He was recently seen by Surgery in regards to a mass along his back. This was found to be most consistent with a sebaceous cyst. Is referred to Cardiology for cardiac clearance prior to the procedure.  In talking with the patient today, he reports doing well from a cardiac perspective. He denies any recent episodes of chest pain with exertion. No recent palpitations, dizziness, lightheadedness, or presyncope. He does have baseline dyspnea with exertion which he reports has been present for many years. He uses Advair twice daily and Albuterol as needed.   He reports good compliance with Eliquis 5 mg BID. Most recent acute DVT was in 10/2015 with doppler studies showing an acute DVT involving theright profunda femoris vein, right femoral vein, left common  femoral vein, and left profunda femoris vein. Chronic DVT involving the right posterial tibial vein, left femoral vein, left saphenofemoral junction, and left popliteal vein. He denies any evidence of active bleeding. No melena or hematochezia.   Past Medical History:  Diagnosis Date  . Asthma   . CAD (coronary artery disease)    a. 09/2013 NSTEMI/PCI: LM nl, LAD 40-50%, D1 100 (2.25 x 28 Vision BMS), LCX min irregs, RI 60-70, 30, RCA 40-50/50-14ms/p, EF 45-50%.  b. cath 12/2014 -occulded BMS in diag, 50% pro to mid LAD, 60% ramus, 30% RCA   . Chest pain 08/2015  . Chronic diastolic CHF (congestive heart failure) ([place])    a. 09/2014 EF 45-50% by LV gram;  b. 01/2014 Echo: EF 55-60%, Gr 1 DD.  Marland Kitchen Cocaine abuse   . COPD (chronic obstructive pulmonary disease) ([place])   . DVT (deep venous thrombosis) ([place])    "I had ~ 10 in each leg"  . Essential hypertension   . Hepatitis C   . History of pneumonia   . Osteoarthritis    a. hands and toes.  . Pulmonary embolism ([place])    a. 2012 - s/p IVC filter;  b. prev on eliquis - noncompliant.  . Shortness of breath dyspnea   . Sinus headache   . Tobacco abuse     Past Surgical History:  Procedure Laterality Date  . CARDIAC CATHETERIZATION N/A 01/04/2015   Procedure: Left Heart Cath and Coronary Angiography;  Surgeon: Burnell Blanks, MD;  Location: [facility];  Service: Cardiovascular;  Laterality: N/A;  . CORONARY ANGIOPLASTY WITH STENT PLACEMENT  10/21/13   BMS to D1  . LEFT HEART CATHETERIZATION WITH CORONARY ANGIOGRAM N/A 10/21/2013   Procedure: LEFT HEART CATHETERIZATION WITH CORONARY ANGIOGRAM;  Surgeon: Leonie Man, MD;  Location: [facility] CATH LAB;  Service: Cardiovascular;  Laterality: N/A;  . VENA CAVA FILTER PLACEMENT  " ~ 2012    Current Medications: Outpatient Medications Prior to Visit  Medication Sig Dispense Refill  . albuterol (PROVENTIL HFA;VENTOLIN HFA) 108 (90 Base) MCG/ACT inhaler Inhale 2 puffs into the lungs  every 6 (six) hours as needed for wheezing or shortness of breath. 1 Inhaler 0  . atorvastatin (LIPITOR) 40 MG tablet Take 1 tablet (40 mg total) by mouth daily. 30 tablet 3  . ELIQUIS 2.5 MG TABS tablet Take 2 tablets (5 mg total) by mouth 2 (two) times daily. 60 tablet 0  . Fluticasone-Salmeterol (ADVAIR DISKUS) 250-50 MCG/DOSE AEPB Inhale 1 puff into the lungs 2 (two) times daily. 1 each 3  . carbamide peroxide (DEBROX) 6.5 % otic solution Place 5 drops into both ears 2 (two) times daily. 15 mL 0  . ipratropium-albuterol (DUONEB) 0.5-2.5 (3) MG/3ML SOLN Take 3 mLs by nebulization every 4 (four) hours as needed. 360 mL 3  . nitroGLYCERIN (NITROSTAT) 0.4 MG SL tablet Place 1 tablet (0.4 mg total) under the tongue every 5 (five) minutes as needed for chest pain (CP or SOB). 30 tablet 0   No facility-administered medications prior to visit.      Allergies:   Patient has no known allergies.   Social History   Social History  . Marital status: Single    Spouse name: N/A  . Number of children: N/A  . Years of education: N/A   Occupational History  . unemployed    Social History Main Topics  . Smoking status: Current Every Day Smoker    Packs/day: 0.25    Years: 38.00    Types: Cigarettes  . Smokeless tobacco: Never Used     Comment: trying to quit- one cig today  . Alcohol use 4.2 oz/week    6 Cans of beer, 1 Shots of liquor per week     Comment: a couple times weekly  . Drug use: No     Comment: a. last use was 8/16 - cocaine  . Sexual activity: Yes   Other Topics Concern  . None   Social History Narrative   Lives in [place].     Family History:  The patient's family history includes Allergies in his brother, mother, and sister; Asthma in his mother; Coronary artery disease in his father; Deep vein thrombosis in his brother; Heart attack in his father.   Review of Systems:   Please see the history of present illness.     General:  No chills, fever, night sweats or weight  changes.  Cardiovascular:  No chest pain, edema, orthopnea, palpitations, paroxysmal nocturnal dyspnea. Positive for dyspnea on exertion.  Dermatological: No rash, lesions/masses Respiratory: No cough, dyspnea Urologic: No hematuria, dysuria Abdominal:   No nausea, vomiting, diarrhea, bright red blood per rectum, melena, or hematemesis Neurologic:  No visual changes, wkns, changes in mental status. All other systems reviewed and are otherwise negative except as noted above.   Physical Exam:    VS:  BP 117/81 (BP Location: Left Arm)   Pulse 100   Ht 6\' 2"  (1.88 m)   Wt 193 lb 3.2 oz (87.6 kg)  BMI 24.81 kg/m    General: Well developed, well nourished [place] American male appearing in no acute distress. Head: Normocephalic, atraumatic, sclera non-icteric, no xanthomas, nares are without discharge.  Neck: No carotid bruits. JVD not elevated.  Lungs: Respirations regular and unlabored, without wheezes or rales.  Heart: Regular rate and rhythm. No S3 or S4.  No murmur, no rubs, or gallops appreciated. Abdomen: Soft, non-tender, non-distended with normoactive bowel sounds. No hepatomegaly. No rebound/guarding. No obvious abdominal masses. Msk:  Strength and tone appear normal for age. No joint deformities or effusions. Extremities: No clubbing or cyanosis. No edema.  Distal pedal pulses are 2+ bilaterally. Neuro: Alert and oriented X 3. Moves all extremities spontaneously. No focal deficits noted. Psych:  Responds to questions appropriately with a normal affect. Skin: No rashes or lesions noted. Large mass located along midline of upper back.   Wt Readings from Last 3 Encounters:  04/25/16 193 lb 3.2 oz (87.6 kg)  03/28/16 193 lb (87.5 kg)  03/28/16 194 lb 12.8 oz (88.4 kg)     Studies/Labs Reviewed:   EKG:  EKG is ordered today.  The ekg ordered today demonstrates NSR, HR 84, with PVC's. No acute ST or T-wave changes when compared to prior tracings.   Recent Labs: 11/06/2015: B  Natriuretic Peptide 30.7 11/09/2015: Magnesium 2.0 02/02/2016: Hemoglobin 12.5; Platelets 138 03/28/2016: ALT 17; BUN 15; Creat 0.82; Potassium 4.3; Sodium 141; TSH 1.33   Lipid Panel    Component Value Date/Time   CHOL 190 01/01/2015 0208   TRIG 60 01/01/2015 0208   HDL 62 01/01/2015 0208   CHOLHDL 3.1 01/01/2015 0208   VLDL 12 01/01/2015 0208   LDLCALC 116 (H) 01/01/2015 0208    Additional studies/ records that were reviewed today include:   Cardiac Catheterization: 01/04/2015  Prox RCA lesion, 30% stenosed.  Mid RCA to Dist RCA lesion, 30% stenosed.  Ost Cx to Prox Cx lesion, 20% stenosed.  Ramus lesion, 60% stenosed.  Prox LAD to Mid LAD lesion, 50% stenosed.  Ost 1st Diag to 1st Diag lesion, 100% stenosed. The lesion was previously treated with a bare metal stentone to two years ago.  The left ventricular systolic function is normal.   1. Double vessel CAD  2. Moderate stenosis moderate caliber Ramus Intermediate branch, does not appear to be flow limiting.  3. Mild to moderate disease in the LAD, RCA and Circumflex.  4. Chronic total occlusion bare metal stent in Diagonal branch. 5. Low normal LV systolic function  Recommendations: Continue medical management of CAD. He admits today to continued tobacco abuse and medication non-compliance. He responded poorly to bare metal stenting in the past. I would maximize his anti-anginal medications and encourage tobacco cessation. He can be discharged home later today. Follow up with Dr. Loralie Champagne following discharge.    NST: 08/2015  There was no ST segment deviation noted during stress.  No T wave inversion was noted during stress.  Defect 1: There is a small defect of moderate severity in the LAD territory.  Findings consistent with ischemia.  The left ventricular ejection fraction is normal (55-65%).  This is an intermediate risk study.   Assessment:    1. Pre-operative clearance   2. Coronary artery  disease involving native coronary artery of native heart with angina pectoris ([place])   3. Chronic diastolic heart failure ([place])   4. Chronic obstructive pulmonary disease, unspecified COPD type ([place])   5. History of DVT (deep vein thrombosis)   6. Tobacco  use      Plan:   In order of problems listed above:  1. Preoperative Cardiac Clearance for Sebaceous Cyst Removal - seen today for preoperative clearance in regards to sebaceous cyst removal from his upper back.  - He denies any recent episodes of chest pain with exertion, palpitations, dizziness, lightheadedness, or presyncope. Has baseline dyspnea with exertion with no acute changes. - recent NST in 08/2015 showed known D1 occlusion with no significant abnormalities noted. EKG today shows NSR and is without acute ischemic changes. - the procedure itself is of a lower cardiac risk and with no recent anginal symptoms, normal EKG today, and recent NST, no further cardiac workup is indicated prior to his surgery. He is of acceptable risk for the procedure.   2. CAD - s/p BMS to D1 in 2015, noted to be chronically occluded by cath in 12/2014 with scattered 30% RCA stenosis, 20% LCx, and 50% Prox-Mid LAD stenosis at that time. - NST in 08/2015 showed a small defect along the LAD territory consistent with his known D1 occlusion. Continued medical therapy was recommended at that time.  - EKG today is without acute ischemic changes. - continue statin. Not on ASA secondary to need for full-dose anticoagulation. No BB with recent cocaine use.   3. Chronic Diastolic CHF - EF 0000000 by echo in 12/2014. - does not appear volume overloaded on physical exam.  4. COPD - reports baseline dyspnea with exertion which has been present for 5+ years. No acute changes in his symptoms. - followed by PCP.  5. History of DVT/PE  - s/p IVC filter placement in 2012. Most recent acute DVT was in 10/2015 with doppler studies showing an acute DVT involving  theright profunda femoris vein, right femoral vein, left common femoral vein, and left profunda femoris vein.  - it has been over 3 months for treatment of his acute DVT, however with recurrent DVT's/PE's, lifelong anticoagulation is indicated. Would hold Eliquis for as short of time as possible prior to surgery (preferbly 36 hours or less) and resume once safe to do so from a surgical perspective.   6. Substance Use - still smoking 0.5 ppd (says he plans to quit on his upcoming birthday). He denies any recent drug use but UDS from 01/2016 was positive for Cocaine. Cessation advised.     Medication Adjustments/Labs and Tests Ordered: Current medicines are reviewed at length with the patient today.  Concerns regarding medicines are outlined above.  Medication changes, Labs and Tests ordered today are listed in the Patient Instructions below. Patient Instructions  Medication Instructions:  Your physician recommends that you continue on your current medications as directed. Please refer to the Current Medication list given to you today.  Labwork: NONE  Testing/Procedures: NONE  Follow-Up: Your physician wants you to follow-up in: [place] Aundra Dubin. You will receive a reminder letter in the mail two months in advance. If you don't receive a letter, please call our office to schedule the follow-up appointment.   If you need a refill on your cardiac medications before your next appointment, please call your pharmacy.   Signed, Erma Heritage, PA  04/25/2016 7:24 PM    [facility] [address], [place] [place], [place]  [address] Phone: [phone]; Fax: [phone]  [address], [place], [place] [address] Phone: [phone]

## 2016-04-25 ENCOUNTER — Encounter: Payer: Self-pay | Admitting: Student

## 2016-04-25 ENCOUNTER — Ambulatory Visit (INDEPENDENT_AMBULATORY_CARE_PROVIDER_SITE_OTHER): Payer: Medicare Other | Admitting: Student

## 2016-04-25 VITALS — BP 117/81 | HR 100 | Ht 74.0 in | Wt 193.2 lb

## 2016-04-25 DIAGNOSIS — I209 Angina pectoris, unspecified: Secondary | ICD-10-CM

## 2016-04-25 DIAGNOSIS — Z86718 Personal history of other venous thrombosis and embolism: Secondary | ICD-10-CM

## 2016-04-25 DIAGNOSIS — J449 Chronic obstructive pulmonary disease, unspecified: Secondary | ICD-10-CM

## 2016-04-25 DIAGNOSIS — I25119 Atherosclerotic heart disease of native coronary artery with unspecified angina pectoris: Secondary | ICD-10-CM

## 2016-04-25 DIAGNOSIS — Z01818 Encounter for other preprocedural examination: Secondary | ICD-10-CM

## 2016-04-25 DIAGNOSIS — I5032 Chronic diastolic (congestive) heart failure: Secondary | ICD-10-CM

## 2016-04-25 DIAGNOSIS — Z720 Tobacco use: Secondary | ICD-10-CM | POA: Diagnosis not present

## 2016-04-25 NOTE — Patient Instructions (Addendum)
Medication Instructions:  Your physician recommends that you continue on your current medications as directed. Please refer to the Current Medication list given to you today.  Labwork: NONE  Testing/Procedures: NONE  Follow-Up: Your physician wants you to follow-up in: [place] Aundra Dubin. You will receive a reminder letter in the mail two months in advance. If you don't receive a letter, please call our office to schedule the follow-up appointment.    You have been cleared for surgery, [name] PA will send a note to Dr. Vonna Kotyk office to make then aware.    If you need a refill on your cardiac medications before your next appointment, please call your pharmacy.

## 2016-04-26 DIAGNOSIS — L739 Follicular disorder, unspecified: Secondary | ICD-10-CM | POA: Diagnosis not present

## 2016-04-26 DIAGNOSIS — L738 Other specified follicular disorders: Secondary | ICD-10-CM | POA: Diagnosis not present

## 2016-05-01 ENCOUNTER — Other Ambulatory Visit: Payer: Self-pay | Admitting: Pharmacist

## 2016-05-01 MED ORDER — PROAIR HFA 108 (90 BASE) MCG/ACT IN AERS: 2.0000 | INHALATION_SPRAY | Freq: Four times a day (QID) | RESPIRATORY_TRACT | 1 refills | Status: DC | PRN

## 2016-05-08 ENCOUNTER — Telehealth: Payer: Self-pay | Admitting: *Deleted

## 2016-05-08 ENCOUNTER — Ambulatory Visit (INDEPENDENT_AMBULATORY_CARE_PROVIDER_SITE_OTHER): Payer: Medicare Other | Admitting: Podiatry

## 2016-05-08 ENCOUNTER — Ambulatory Visit (INDEPENDENT_AMBULATORY_CARE_PROVIDER_SITE_OTHER): Payer: Medicare Other

## 2016-05-08 ENCOUNTER — Encounter: Payer: Self-pay | Admitting: Podiatry

## 2016-05-08 VITALS — BP 142/91 | HR 84 | Resp 18

## 2016-05-08 DIAGNOSIS — L84 Corns and callosities: Secondary | ICD-10-CM | POA: Diagnosis not present

## 2016-05-08 DIAGNOSIS — I25119 Atherosclerotic heart disease of native coronary artery with unspecified angina pectoris: Secondary | ICD-10-CM

## 2016-05-08 DIAGNOSIS — M2011 Hallux valgus (acquired), right foot: Secondary | ICD-10-CM

## 2016-05-08 DIAGNOSIS — R0989 Other specified symptoms and signs involving the circulatory and respiratory systems: Secondary | ICD-10-CM

## 2016-05-08 NOTE — Progress Notes (Signed)
   Subjective:    Patient ID: [name], male    DOB: 25-Jan-1961, 56 y.o.   MRN: [id number]  HPI  41-year-old male presents the office today for concerns of a painful callus along the right foot on the medial first metatarsal head. This has been ongoing for several years. He states he will trimmed area but that does come back and becomes painful. He would have the area cut out completely if possible. Areas and painful with shoes he is tried changing shoes, trimming,  without any significant improvemet.  He has no other complaints.  Review of Systems  HENT: Positive for sinus pressure and sneezing.   Respiratory: Positive for cough and shortness of breath.   Cardiovascular:       Calf  pain with walking  Musculoskeletal: Positive for gait problem.  Neurological: Positive for weakness.  All other systems reviewed and are negative.      Objective:   Physical Exam  General: AAO x3, NAD  Dermatological: thick hyperkeratotic lesion present medial right first metatarsal head. Upon debridement there is no evidence of verruca and there is no open sores or pre-ulcerative lesions.   Vascular: DP pulses 2/4, PT pulses decreased. No varicosities and no lower extremity edema present bilateral. There is no pain with calf compression, swelling, warmth, erythema.   Neruologic: Grossly intact via light touch bilateral. Vibratory intact via tuning fork bilateral. Protective threshold with Semmes Wienstein monofilament intact to all pedal sites bilateral.   Musculoskeletal: Mild HAV is present. Tenderness directly along the hyperkeratotic lesion. No other areas of tenderness bilaterally.  No pain, crepitus, or limitation noted with foot and ankle range of motion bilateral. Muscular strength 5/5 in all groups tested bilateral.  Gait: Unassisted, Nonantalgic.      Assessment & Plan:  56 year old male presents with large painful hyperkeratoticlesion right foot -Treatment options discussed including  all alternatives, risks, and complications -Etiology of symptoms were discussed -X-rays were obtained and reviewed with the patient. No evidence of acute fracture. Mild HAV.  -I discussed both conservative and surgical treatment options with the patient.  -hyperkeratotic lesion sharply debrided 1 without complications or bleeding. -I discussed surgical intervention which would likely proceed with however we we'll obtain arterial studies before proceeding treatment. He also has another upcoming surgery.We will need medical clearance and to be off of eliquis prior to surgery.  Cliffton Asters, dPM

## 2016-05-08 NOTE — Pre-Procedure Instructions (Signed)
[name]  05/08/2016      [facility] [id number] - [address] [place] [address] Phone: [phone] Fax: [phone]    Your procedure is scheduled on Wednesday February 14.  Report to [facility] Admitting at 10:00 A.M.  Call this number if you have problems the morning of surgery:  [phone]   Remember:  Do not eat food or drink liquids after midnight.  Take these medicines the morning of surgery with A SIP OF WATER: doxycycline, ADVAIR, Proair if needed (please bring inhaler to hospital with you)  7 days prior to surgery STOP taking any Aspirin, Aleve, Naproxen, Ibuprofen, Motrin, Advil, Goody's, BC's, all herbal medications, fish oil, and all vitamins    Do not wear jewelry, make-up or nail polish.  Do not wear lotions, powders, or perfumes, or deoderant.  Do not shave 48 hours prior to surgery.  Men may shave face and neck.  Do not bring valuables to the hospital.  [facility] is not responsible for any belongings or valuables.  Contacts, dentures or bridgework may not be worn into surgery.  Leave your suitcase in the car.  After surgery it may be brought to your room.  For patients admitted to the hospital, discharge time will be determined by your treatment team.  Patients discharged the day of surgery will not be allowed to drive home.   Special instructions:    [facility]- Preparing For Surgery  Before surgery, you can play an important role. Because skin is not sterile, your skin needs to be as free of germs as possible. You can reduce the number of germs on your skin by washing with CHG (chlorahexidine gluconate) Soap before surgery.  CHG is an antiseptic cleaner which kills germs and bonds with the skin to continue killing germs even after washing.  Please do not use if you have an allergy to CHG or antibacterial soaps. If your skin becomes  reddened/irritated stop using the CHG.  Do not shave (including legs and underarms) for at least 48 hours prior to first CHG shower. It is OK to shave your face.  Please follow these instructions carefully.   1. Shower the NIGHT BEFORE SURGERY and the MORNING OF SURGERY with CHG.   2. If you chose to wash your hair, wash your hair first as usual with your normal shampoo.  3. After you shampoo, rinse your hair and body thoroughly to remove the shampoo.  4. Use CHG as you would any other liquid soap. You can apply CHG directly to the skin and wash gently with a scrungie or a clean washcloth.   5. Apply the CHG Soap to your body ONLY FROM THE NECK DOWN.  Do not use on open wounds or open sores. Avoid contact with your eyes, ears, mouth and genitals (private parts). Wash genitals (private parts) with your normal soap.  6. Wash thoroughly, paying special attention to the area where your surgery will be performed.  7. Thoroughly rinse your body with warm water from the neck down.  8. DO NOT shower/wash with your normal soap after using and rinsing off the CHG Soap.  9. Pat yourself dry with a CLEAN TOWEL.   10. Wear CLEAN PAJAMAS   11. Place CLEAN SHEETS on your bed the night of your first shower and DO NOT SLEEP WITH PETS.    Day of  Surgery: Do not apply any deodorants/lotions. Please wear clean clothes to the hospital/surgery center.

## 2016-05-08 NOTE — Telephone Encounter (Addendum)
-----   Message from Trula Slade, DPM sent at 05/08/2016  9:58 AM EST ----- Can you please order arterial studies for decreased pulses. [facility] DOES NOT NEED PRIOR AUTHORIZATION FOR ABI AND TBI.Marland Kitchen Faxed orders to [facility].

## 2016-05-09 ENCOUNTER — Other Ambulatory Visit: Payer: Self-pay | Admitting: Podiatry

## 2016-05-09 ENCOUNTER — Encounter (HOSPITAL_COMMUNITY): Payer: Self-pay

## 2016-05-09 ENCOUNTER — Encounter (HOSPITAL_COMMUNITY)
Admission: RE | Admit: 2016-05-09 | Discharge: 2016-05-09 | Disposition: A | Discharge status: Home / Self Care | Payer: Medicare Other | Source: Ambulatory Visit | Attending: Surgery | Admitting: Surgery

## 2016-05-09 DIAGNOSIS — M549 Dorsalgia, unspecified: Secondary | ICD-10-CM | POA: Diagnosis not present

## 2016-05-09 DIAGNOSIS — L723 Sebaceous cyst: Secondary | ICD-10-CM | POA: Diagnosis present

## 2016-05-09 DIAGNOSIS — J449 Chronic obstructive pulmonary disease, unspecified: Secondary | ICD-10-CM | POA: Diagnosis not present

## 2016-05-09 DIAGNOSIS — J45909 Unspecified asthma, uncomplicated: Secondary | ICD-10-CM | POA: Diagnosis not present

## 2016-05-09 DIAGNOSIS — I10 Essential (primary) hypertension: Secondary | ICD-10-CM | POA: Diagnosis not present

## 2016-05-09 DIAGNOSIS — F17200 Nicotine dependence, unspecified, uncomplicated: Secondary | ICD-10-CM | POA: Diagnosis not present

## 2016-05-09 DIAGNOSIS — Z86711 Personal history of pulmonary embolism: Secondary | ICD-10-CM | POA: Diagnosis not present

## 2016-05-09 DIAGNOSIS — Z8261 Family history of arthritis: Secondary | ICD-10-CM | POA: Diagnosis not present

## 2016-05-09 DIAGNOSIS — R0989 Other specified symptoms and signs involving the circulatory and respiratory systems: Secondary | ICD-10-CM

## 2016-05-09 DIAGNOSIS — Z79899 Other long term (current) drug therapy: Secondary | ICD-10-CM | POA: Diagnosis not present

## 2016-05-09 DIAGNOSIS — Z7901 Long term (current) use of anticoagulants: Secondary | ICD-10-CM | POA: Diagnosis not present

## 2016-05-09 DIAGNOSIS — L720 Epidermal cyst: Secondary | ICD-10-CM | POA: Diagnosis not present

## 2016-05-09 DIAGNOSIS — I252 Old myocardial infarction: Secondary | ICD-10-CM | POA: Diagnosis not present

## 2016-05-09 DIAGNOSIS — K759 Inflammatory liver disease, unspecified: Secondary | ICD-10-CM | POA: Diagnosis not present

## 2016-05-09 LAB — CBC
HCT: 37.5 % — ABNORMAL LOW (ref 39.0–52.0)
HEMOGLOBIN: 12 g/dL — AB (ref 13.0–17.0)
MCH: 29.9 pg (ref 26.0–34.0)
MCHC: 32 g/dL (ref 30.0–36.0)
MCV: 93.5 fL (ref 78.0–100.0)
Platelets: 139 10*3/uL — ABNORMAL LOW (ref 150–400)
RBC: 4.01 MIL/uL — ABNORMAL LOW (ref 4.22–5.81)
RDW: 13.1 % (ref 11.5–15.5)
WBC: 4.6 10*3/uL (ref 4.0–10.5)

## 2016-05-09 LAB — BASIC METABOLIC PANEL
ANION GAP: 9 (ref 5–15)
BUN: 10 mg/dL (ref 6–20)
CALCIUM: 9.3 mg/dL (ref 8.9–10.3)
CO2: 21 mmol/L — AB (ref 22–32)
CREATININE: 0.75 mg/dL (ref 0.61–1.24)
Chloride: 112 mmol/L — ABNORMAL HIGH (ref 101–111)
GFR calc Af Amer: >60 mL/min (ref >60)
GFR calc non Af Amer: >60 mL/min (ref >60)
GLUCOSE: 92 mg/dL (ref 65–99)
Potassium: 3.7 mmol/L (ref 3.5–5.1)
Sodium: 142 mmol/L (ref 135–145)

## 2016-05-09 MED ORDER — CHLORHEXIDINE GLUCONATE CLOTH 2 % EX PADS: 6.0000 | MEDICATED_PAD | Freq: Once | CUTANEOUS | Status: DC

## 2016-05-09 NOTE — Progress Notes (Signed)
Anesthesia Chart Review: Patient is a 56 year old male scheduled for excision of sebaceous cyst upper back on 05/10/2016 by Dr. Georgette Dover.  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   PCP is Dr. Arnoldo Morale. Cardiologist is Dr. Aundra Dubin. He was also seen by Dr. Acie Fredrickson 6/107 for chest pain. Stress test showed a small defect along the LAD territory consistent with his known D1 occlusion. Continue medical therapy was recommended. He was seen by [name], [place] on 04/25/2016 for preoperative evaluation and was felt "acceptable risk for the procedure."  Meds include albuterol, Lipitor, doxycycline, Eliquis, Advair, Pro-air. Not on ASA secondary to need for full-dose anticoagulation. No BB with recent cocaine use. He reports being instructed to hold Eliquis for 24 hours prior to surgery.   BP (!) 146/87   Pulse 76   Temp 36.5 C   Resp 18   Ht 6\' 2"  (1.88 m)   Wt 194 lb 6.4 oz (88.2 kg)   SpO2 100%   BMI 24.96 kg/m   EKG 04/25/16: SR with occasional PVCs.   Nuclear stress test 09/08/15:  There was no ST segment deviation noted during stress.  No T wave inversion was noted during stress.  Defect 1: There is a small defect of moderate severity in the LAD territory.  Findings consistent with ischemia.  The left ventricular ejection fraction is normal (55-65%).  This is an intermediate risk study.  Cardiac cath 01/04/15:  Prox RCA lesion, 30% stenosed.  Mid RCA to Dist RCA lesion, 30% stenosed.  Ost Cx to Prox Cx lesion, 20% stenosed.  Ramus lesion, 60% stenosed.  Prox LAD to Mid LAD lesion, 50% stenosed.  Ost 1st Diag to 1st Diag lesion, 100% stenosed. The lesion was previously treated with a bare metal stentone to two years  ago.  The left ventricular systolic function is normal. 1. Double vessel CAD  2. Moderate stenosis moderate caliber Ramus Intermediate branch, does not appear to be flow limiting.  3. Mild to moderate disease in the LAD, RCA and Circumflex.  4. Chronic total occlusion bare metal stent in Diagonal branch. 5. Low normal LV systolic function. Recommendations: Continue medical management of CAD. He admits today to continued tobacco abuse and medication non-compliance. He responded poorly to bare metal stenting in the past. I would maximize his anti-anginal medications and encourage tobacco cessation. He can be discharged home later today. Follow up with Dr. Loralie Champagne following discharge.   Echo 01/02/15: Study Conclusions - Left ventricle: The cavity size was normal. There was mild   concentric hypertrophy. Systolic function was normal. The   estimated ejection fraction was in the range of 55% to 60%. Wall   motion was normal; there were no regional wall motion   abnormalities. Doppler parameters are consistent with abnormal   left ventricular relaxation (grade 1 diastolic dysfunction).   There was no evidence of elevated ventricular filling pressure by   Doppler parameters. - Aortic valve: Trileaflet; normal thickness leaflets. There was no   regurgitation. - Aortic root: The aortic root was normal in size. - Mitral valve: Structurally normal valve. There was no   regurgitation. - Right ventricle: The cavity size was normal. Wall thickness was   normal. Systolic function was normal. - Right atrium: The atrium was  normal in size. - Tricuspid valve: There was trivial regurgitation. - Pulmonic valve: There was no regurgitation. - Pulmonary arteries: Systolic pressure was within the normal   range. - Inferior vena cava: The vessel was normal in size. - Pericardium, extracardiac: There was no pericardial effusion.  BLE Venous Duplex 11/06/15: Summary: - Findings consistent with acute  deep vein thrombosis involving the   right profunda femoris vein, right femoral vein, left common   femoral vein, and left profunda femoris vein. - Chronic deep vein thrombosis involving the right posterial tibial   vein, left femoral vein, left saphenofemoral junction, and left   popliteal vein.  PCXR 02/01/16: IMPRESSION: No infiltrate or edema  CTA Chest 11/06/15: IMPRESSION: 1. Negative for acute PE or thoracic aortic dissection. 2. Bronchiectasis and bronchitis with scattered areas of mucous plugging. 3. Atherosclerosis, including coronary artery disease. Please note that although the presence of coronary artery calcium documents the presence of coronary artery disease, the severity of this disease and any potential stenosis cannot be assessed on this non-gated CT examination. Assessment for potential risk factor modification, dietary therapy or pharmacologic therapy may be warranted, if clinically indicated.  Normal spirometry 05/28/12.   Preoperative labs noted. Cr 0.75. H/H 12.0/37.5. PLT 139. LFTs WNL 03/28/16.   If no acute changes then I anticipate that he can proceed as planned.  George Hugh [facility] Short Stay Center/Anesthesiology Phone [phone] 05/09/2016 12:32 PM

## 2016-05-09 NOTE — Progress Notes (Signed)
PCP: Dr. Jarold Song Pt with cardiac clearance from [name], [place], note 04/25/16, per pt he has seen Dr. Aundra Dubin in the past.  Echo: 01/02/15 Stress test: 09/08/15 under media tab Cath: 01/04/15 EKG: 04/25/16  Chart forwarded to anesthesia for review.   Pt denies any recent cardiac symptoms, no chest pain, SOB at PAT appointment.   Pt states he was instructed to hold his Eliquis for 24 hours prior to surgery. Last dose 05/08/16

## 2016-05-10 ENCOUNTER — Ambulatory Visit (HOSPITAL_COMMUNITY): Payer: Medicare Other | Admitting: Anesthesiology

## 2016-05-10 ENCOUNTER — Ambulatory Visit (HOSPITAL_COMMUNITY): Payer: Medicare Other | Admitting: Vascular Surgery

## 2016-05-10 ENCOUNTER — Encounter (HOSPITAL_COMMUNITY)
Admission: RE | Disposition: A | Discharge status: Home / Self Care | Payer: Self-pay | Source: Ambulatory Visit | Attending: Surgery

## 2016-05-10 ENCOUNTER — Encounter (HOSPITAL_COMMUNITY): Payer: Self-pay | Admitting: *Deleted

## 2016-05-10 ENCOUNTER — Ambulatory Visit (HOSPITAL_COMMUNITY)
Admission: RE | Admit: 2016-05-10 | Discharge: 2016-05-10 | Disposition: A | Discharge status: Home / Self Care | Payer: Medicare Other | Source: Ambulatory Visit | Attending: Surgery | Admitting: Surgery

## 2016-05-10 DIAGNOSIS — J449 Chronic obstructive pulmonary disease, unspecified: Secondary | ICD-10-CM | POA: Insufficient documentation

## 2016-05-10 DIAGNOSIS — Z7901 Long term (current) use of anticoagulants: Secondary | ICD-10-CM | POA: Insufficient documentation

## 2016-05-10 DIAGNOSIS — Z8261 Family history of arthritis: Secondary | ICD-10-CM | POA: Insufficient documentation

## 2016-05-10 DIAGNOSIS — L720 Epidermal cyst: Secondary | ICD-10-CM | POA: Insufficient documentation

## 2016-05-10 DIAGNOSIS — I10 Essential (primary) hypertension: Secondary | ICD-10-CM | POA: Insufficient documentation

## 2016-05-10 DIAGNOSIS — D696 Thrombocytopenia, unspecified: Secondary | ICD-10-CM | POA: Diagnosis not present

## 2016-05-10 DIAGNOSIS — I2510 Atherosclerotic heart disease of native coronary artery without angina pectoris: Secondary | ICD-10-CM | POA: Diagnosis not present

## 2016-05-10 DIAGNOSIS — F17200 Nicotine dependence, unspecified, uncomplicated: Secondary | ICD-10-CM | POA: Insufficient documentation

## 2016-05-10 DIAGNOSIS — M549 Dorsalgia, unspecified: Secondary | ICD-10-CM | POA: Diagnosis not present

## 2016-05-10 DIAGNOSIS — I252 Old myocardial infarction: Secondary | ICD-10-CM | POA: Diagnosis not present

## 2016-05-10 DIAGNOSIS — Z86711 Personal history of pulmonary embolism: Secondary | ICD-10-CM | POA: Diagnosis not present

## 2016-05-10 DIAGNOSIS — J45909 Unspecified asthma, uncomplicated: Secondary | ICD-10-CM | POA: Diagnosis not present

## 2016-05-10 DIAGNOSIS — K759 Inflammatory liver disease, unspecified: Secondary | ICD-10-CM | POA: Diagnosis not present

## 2016-05-10 DIAGNOSIS — Z79899 Other long term (current) drug therapy: Secondary | ICD-10-CM | POA: Insufficient documentation

## 2016-05-10 DIAGNOSIS — L723 Sebaceous cyst: Secondary | ICD-10-CM | POA: Diagnosis not present

## 2016-05-10 HISTORY — PX: CYST EXCISION: SHX5701

## 2016-05-10 LAB — PROTIME-INR
INR: 0.96
Prothrombin Time: 12.8 seconds (ref 11.4–15.2)

## 2016-05-10 SURGERY — CYST REMOVAL
Anesthesia: General | Site: Back

## 2016-05-10 MED ORDER — BUPIVACAINE HCL (PF) 0.25 % IJ SOLN
INTRAMUSCULAR | Status: AC
  Filled 2016-05-10: qty 30

## 2016-05-10 MED ORDER — HYDROCODONE-ACETAMINOPHEN 5-325 MG PO TABS: 1.0000 | ORAL_TABLET | Freq: Four times a day (QID) | ORAL | 0 refills | Status: DC | PRN

## 2016-05-10 MED ORDER — CEFAZOLIN SODIUM-DEXTROSE 2-4 GM/100ML-% IV SOLN
2.0000 g | INTRAVENOUS | Status: AC
  Administered 2016-05-10: 2 g via INTRAVENOUS
  Filled 2016-05-10: qty 100

## 2016-05-10 MED ORDER — FENTANYL CITRATE (PF) 100 MCG/2ML IJ SOLN
INTRAMUSCULAR | Status: AC
  Filled 2016-05-10: qty 2

## 2016-05-10 MED ORDER — MIDAZOLAM HCL 2 MG/2ML IJ SOLN
INTRAMUSCULAR | Status: AC
  Filled 2016-05-10: qty 2

## 2016-05-10 MED ORDER — FENTANYL CITRATE (PF) 100 MCG/2ML IJ SOLN
INTRAMUSCULAR | Status: DC | PRN
  Administered 2016-05-10 (×3): 25 ug via INTRAVENOUS

## 2016-05-10 MED ORDER — PROPOFOL 10 MG/ML IV BOLUS
INTRAVENOUS | Status: DC | PRN
  Administered 2016-05-10: 200 mg via INTRAVENOUS

## 2016-05-10 MED ORDER — LIDOCAINE 2% (20 MG/ML) 5 ML SYRINGE
INTRAMUSCULAR | Status: DC | PRN
  Administered 2016-05-10: 100 mg via INTRAVENOUS

## 2016-05-10 MED ORDER — ONDANSETRON HCL 4 MG/2ML IJ SOLN
INTRAMUSCULAR | Status: DC | PRN
  Administered 2016-05-10: 4 mg via INTRAVENOUS

## 2016-05-10 MED ORDER — BUPIVACAINE HCL (PF) 0.25 % IJ SOLN
INTRAMUSCULAR | Status: DC | PRN
  Administered 2016-05-10: 30 mL

## 2016-05-10 MED ORDER — MIDAZOLAM HCL 5 MG/5ML IJ SOLN
INTRAMUSCULAR | Status: DC | PRN
Start: 2016-05-10 — End: 2016-05-10
  Administered 2016-05-10: 2 mg via INTRAVENOUS

## 2016-05-10 MED ORDER — BUPIVACAINE HCL (PF) 0.25 % IJ SOLN
INTRAMUSCULAR | Status: AC
  Filled 2016-05-10: qty 10

## 2016-05-10 MED ORDER — LACTATED RINGERS IV SOLN
INTRAVENOUS | Status: DC
  Administered 2016-05-10: 10:00:00 via INTRAVENOUS

## 2016-05-10 MED ORDER — 0.9 % SODIUM CHLORIDE (POUR BTL) OPTIME
TOPICAL | Status: DC | PRN
  Administered 2016-05-10: 1000 mL

## 2016-05-10 MED ORDER — PROPOFOL 10 MG/ML IV BOLUS
INTRAVENOUS | Status: AC
  Filled 2016-05-10: qty 20

## 2016-05-10 SURGICAL SUPPLY — 49 items
APL SKNCLS STERI-STRIP NONHPOA (GAUZE/BANDAGES/DRESSINGS) ×1
BENZOIN TINCTURE PRP APPL 2/3 (GAUZE/BANDAGES/DRESSINGS) ×3 IMPLANT
BLADE 15 SAFETY STRL DISP (BLADE) ×2 IMPLANT
BLADE SURG ROTATE 9660 (MISCELLANEOUS) IMPLANT
CHLORAPREP W/TINT 26ML (MISCELLANEOUS) ×2 IMPLANT
CLEANER TIP ELECTROSURG 2X2 (MISCELLANEOUS) ×3 IMPLANT
CLOSURE WOUND 1/2 X4 (GAUZE/BANDAGES/DRESSINGS) ×1
CONT SPEC 4OZ CLIKSEAL STRL BL (MISCELLANEOUS) ×2 IMPLANT
COVER SURGICAL LIGHT HANDLE (MISCELLANEOUS) ×3 IMPLANT
DECANTER SPIKE VIAL GLASS SM (MISCELLANEOUS) IMPLANT
DRAPE LAPAROTOMY 100X72 PEDS (DRAPES) ×3 IMPLANT
DRAPE UTILITY XL STRL (DRAPES) ×6 IMPLANT
DRSG TEGADERM 4X4.75 (GAUZE/BANDAGES/DRESSINGS) ×2 IMPLANT
ELECT REM PT RETURN 9FT ADLT (ELECTROSURGICAL) ×3
ELECTRODE REM PT RTRN 9FT ADLT (ELECTROSURGICAL) ×1 IMPLANT
GAUZE SPONGE 2X2 8PLY STRL LF (GAUZE/BANDAGES/DRESSINGS) IMPLANT
GAUZE SPONGE 4X4 12PLY STRL (GAUZE/BANDAGES/DRESSINGS) ×3 IMPLANT
GLOVE BIO SURGEON STRL SZ7 (GLOVE) ×3 IMPLANT
GLOVE BIOGEL PI IND STRL 6 (GLOVE) IMPLANT
GLOVE BIOGEL PI IND STRL 7.5 (GLOVE) ×1 IMPLANT
GLOVE BIOGEL PI INDICATOR 6 (GLOVE) ×2
GLOVE BIOGEL PI INDICATOR 7.5 (GLOVE) ×2
GOWN STRL REUS W/ TWL LRG LVL3 (GOWN DISPOSABLE) ×2 IMPLANT
GOWN STRL REUS W/TWL LRG LVL3 (GOWN DISPOSABLE) ×6
KIT BASIN OR (CUSTOM PROCEDURE TRAY) ×3 IMPLANT
KIT ROOM TURNOVER OR (KITS) ×3 IMPLANT
NDL HYPO 25GX1X1/2 BEV (NEEDLE) IMPLANT
NEEDLE HYPO 25GX1X1/2 BEV (NEEDLE) IMPLANT
NS IRRIG 1000ML POUR BTL (IV SOLUTION) ×3 IMPLANT
PACK SURGICAL SETUP 50X90 (CUSTOM PROCEDURE TRAY) ×3 IMPLANT
PAD ARMBOARD 7.5X6 YLW CONV (MISCELLANEOUS) ×6 IMPLANT
PENCIL BUTTON HOLSTER BLD 10FT (ELECTRODE) ×3 IMPLANT
SPECIMEN JAR SMALL (MISCELLANEOUS) IMPLANT
SPONGE GAUZE 2X2 STER 10/PKG (GAUZE/BANDAGES/DRESSINGS) ×2
SPONGE LAP 4X18 X RAY DECT (DISPOSABLE) ×6 IMPLANT
STRIP CLOSURE SKIN 1/2X4 (GAUZE/BANDAGES/DRESSINGS) ×2 IMPLANT
SUT MNCRL AB 4-0 PS2 18 (SUTURE) ×3 IMPLANT
SUT VIC AB 2-0 SH 27 (SUTURE)
SUT VIC AB 2-0 SH 27X BRD (SUTURE) IMPLANT
SUT VIC AB 3-0 SH 27 (SUTURE) ×3
SUT VIC AB 3-0 SH 27XBRD (SUTURE) ×1 IMPLANT
SYR BULB IRRIGATION 50ML (SYRINGE) ×2 IMPLANT
SYR CONTROL 10ML LL (SYRINGE) IMPLANT
TOWEL OR 17X24 6PK STRL BLUE (TOWEL DISPOSABLE) ×3 IMPLANT
TOWEL OR 17X26 10 PK STRL BLUE (TOWEL DISPOSABLE) ×3 IMPLANT
TUBING BULK SUCTION (MISCELLANEOUS) ×4 IMPLANT
TUBING VACUUM 7/8 X6' (MISCELLANEOUS) IMPLANT
WATER STERILE IRR 1000ML POUR (IV SOLUTION) IMPLANT
YANKAUER SUCT BULB TIP NO VENT (SUCTIONS) ×3 IMPLANT

## 2016-05-10 NOTE — Transfer of Care (Signed)
Immediate Anesthesia Transfer of Care Note  Patient: [name]  Procedure(s) Performed: Procedure(s): EXCISION OF SEBACEOUS CYST UPPER BACK (N/A)  Patient Location: PACU  Anesthesia Type:General  Level of Consciousness: awake, alert  and oriented  Airway & Oxygen Therapy: Patient Spontanous Breathing  Post-op Assessment: Report given to RN and Post -op Vital signs reviewed and stable  Post vital signs: Reviewed and stable  Last Vitals:  Vitals:   05/10/16 0951  BP: (!) 131/94  Pulse: 80  Resp: 18  Temp: 36.7 C    Last Pain:  Vitals:   05/10/16 0951  TempSrc: Oral         Complications: No apparent anesthesia complications

## 2016-05-10 NOTE — Discharge Instructions (Signed)
[place] Office Phone Number [phone]   POST OP INSTRUCTIONS  Always review your discharge instruction sheet given to you by the facility where your surgery was performed.  IF YOU HAVE DISABILITY OR FAMILY LEAVE FORMS, YOU MUST BRING THEM TO THE OFFICE FOR PROCESSING.  DO NOT GIVE THEM TO YOUR DOCTOR.  1. A prescription for pain medication may be given to you upon discharge.  Take your pain medication as prescribed, if needed.  If narcotic pain medicine is not needed, then you may take acetaminophen (Tylenol) or ibuprofen (Advil) as needed. 2. Take your usually prescribed medications unless otherwise directed 3. If you need a refill on your pain medication, please contact your pharmacy.  They will contact our office to request authorization.  Prescriptions will not be filled after 5pm or on week-ends. 4. You should eat very light the first 24 hours after surgery, such as soup, crackers, pudding, etc.  Resume your normal diet the day after surgery. 5. Most patients will experience some swelling and bruising around the incisioin.  Ice packs will help.  Swelling and bruising can take several days to resolve.  6. It is common to experience some constipation if taking pain medication after surgery.  Increasing fluid intake and taking a stool softener will usually help or prevent this problem from occurring.  A mild laxative (Milk of Magnesia or Miralax) should be taken according to package directions if there are no bowel movements after 48 hours. 7. Unless discharge instructions indicate otherwise, you may remove your bandages 48 hours after surgery, and you may shower at that time.  You may have steri-strips (small skin tapes) in place directly over the incision.  These strips should be left on the skin for 7-10 days.  8. ACTIVITIES:  You may resume regular daily activities (gradually increasing) beginning the next day.  You may have sexual intercourse when it is  comfortable. a. You may drive when you no longer are taking prescription pain medication, you can comfortably wear a seatbelt, and you can safely maneuver your car and apply brakes. b. RETURN TO WORK:  ______________________________________________________________________________________ 9. You should see your doctor in the office for a follow-up appointment approximately two weeks after your surgery.  10. OTHER INSTRUCTIONS: _______________________________________________________________________________________________ _____________________________________________________________________________________________________________________________________ _____________________________________________________________________________________________________________________________________ _____________________________________________________________________________________________________________________________________  WHEN TO CALL YOUR DOCTOR: 1. Fever over 101.0 2. Nausea and/or vomiting. 3. Extreme swelling or bruising. 4. Continued bleeding from incision. 5. Increased pain, redness, or drainage from the incision.  The clinic staff is available to answer your questions during regular business hours.  Please dont hesitate to call and ask to speak to one of the nurses for clinical concerns.  If you have a medical emergency, go to the nearest emergency room or call 911.  A surgeon from [facility] is always on call at the hospital.  For further questions, please visit [email or web address]

## 2016-05-10 NOTE — Anesthesia Procedure Notes (Signed)
Procedure Name: LMA Insertion Date/Time: 05/10/2016 11:43 AM Performed by: Manuela Schwartz B Pre-anesthesia Checklist: Patient identified, Emergency Drugs available, Suction available, Patient being monitored and Timeout performed Patient Re-evaluated:Patient Re-evaluated prior to inductionOxygen Delivery Method: Circle system utilized Preoxygenation: Pre-oxygenation with 100% oxygen Intubation Type: IV induction LMA: LMA inserted LMA Size: 4.0 Number of attempts: 1 Placement Confirmation: positive ETCO2 and breath sounds checked- equal and bilateral Tube secured with: Tape Dental Injury: Teeth and Oropharynx as per pre-operative assessment

## 2016-05-10 NOTE — Op Note (Signed)
Pre-op diagnosis:  Sebaceous cyst - back - 3 cm Post-op diagnosis:  Same Procedure:  Excision of 3 cm sebaceous cyst of the upper back Surgeon:Trafton Roker K. Anesthesia: Gen.  Indications: This is a 56 year old male who has had a lot of skin issues on his back. He has a sebaceous cyst that has been enlarging for several years. He has become fairly large and uncomfortable. He would like to have this area removed.  Description of procedure: The patient is brought to the operating room and placed in a supine position on the operating room table. After an adequate level of general anesthesia was obtained, he was moved to a lateral position on his left side. The beanbag was used to hold in place. His upper back was prepped with ChloraPrep and draped sterile fashion. A timeout was taken to ensure the proper patient and proper procedure. We made an elliptical incision across the center of the cyst to include the opening of the cyst. We carefully dissected down through the dermis to the surface of the cyst. We dissected around the cyst completely. The cyst was removed entirely. This was sent for pathologic examination. The wound was then irrigated thoroughly and inspected for hemostasis. We closed the wound with a deep layer of 3-0 Vicryl and a subcuticular layer of 4-0 Monocryl. Steri-Strips and clean dressings were applied. The patient was then extubated and brought to the recovery room in stable condition. All sponge, instrument, and needle counts are correct.  Imogene Burn. Georgette Dover, MD, [facility]  General/ Trauma Surgery  05/10/2016 12:46 PM

## 2016-05-10 NOTE — Anesthesia Postprocedure Evaluation (Signed)
Anesthesia Post Note  Patient: [name]  Procedure(s) Performed: Procedure(s) (LRB): EXCISION OF SEBACEOUS CYST UPPER BACK (N/A)  Patient location during evaluation: PACU Anesthesia Type: General Level of consciousness: awake Pain management: pain level controlled Vital Signs Assessment: post-procedure vital signs reviewed and stable Respiratory status: spontaneous breathing Cardiovascular status: stable Postop Assessment: no signs of nausea or vomiting Anesthetic complications: no        Last Vitals:  Vitals:   05/10/16 1259 05/10/16 1300  BP: 133/88 131/90  Pulse:  81  Resp:    Temp:      Last Pain:  Vitals:   05/10/16 0951  TempSrc: Oral   Pain Goal:                 Khalid Lacko JR,JOHN Montarius Kitagawa

## 2016-05-10 NOTE — Interval H&P Note (Signed)
History and Physical Interval Note:  05/10/2016 10:24 AM  [name]  has presented today for surgery, with the diagnosis of Sebaceous cyst upper back  3 cm  The various methods of treatment have been discussed with the patient and family. After consideration of risks, benefits and other options for treatment, the patient has consented to  Procedure(s): EXCISION OF SEBACEOUS CYST UPPER BACK (N/A) as a surgical intervention .  The patient's history has been reviewed, patient examined, no change in status, stable for surgery.  I have reviewed the patient's chart and labs.  Questions were answered to the patient's satisfaction.     Falon Flinchum K.

## 2016-05-10 NOTE — H&P (View-Only) (Signed)
History of Present Illness [name]. Sonya Gunnoe MD; 04/11/2016 3:33 PM) Patient words: cyst back.  The patient is a 56 year old male who presents with a complaint of Mass. This is a 56 year old male with a history of coronary artery disease and COPD who presents with about 15 years of a slowly enlarging mass on his upper back. The patient has a lot of skin lesions all over his back but this mass has become relatively large and uncomfortable. It has never become infected. He presents now for evaluation for removal.  The patient has a history of PE and is anticoagulated Eliquis   Past Surgical History (Ammie Eversole, LPN; 624THL D34-534 PM) No pertinent past surgical history  Diagnostic Studies History (Ammie Eversole, LPN; 624THL D34-534 PM) Colonoscopy never  Allergies (Ammie Eversole, LPN; 624THL [id number] PM) No Known Drug Allergies 04/11/2016  Medication History (Ammie Eversole, LPN; 624THL D34-534 PM) Advair Diskus (250-50MCG/DOSE Aero Pow Br Act, Inhalation) Active. Atorvastatin Calcium (40MG  Tablet, Oral) Active. Eliquis (5MG  Tablet, Oral) Active. Proventil HFA (108 (90 Base)MCG/ACT Aerosol Soln, Inhalation) Active. Medications Reconciled  Social History (Aleatha Borer, LPN; 624THL D34-534 PM) Alcohol use Occasional alcohol use. Illicit drug use Remotely quit drug use. No caffeine use Tobacco use Current some day smoker.  Family History Aleatha Borer, LPN; 624THL D34-534 PM) Arthritis Father, Mother.  Other Problems (Ammie Eversole, LPN; 624THL D34-534 PM) Asthma Back Pain Chronic Obstructive Lung Disease Hepatitis Myocardial infarction     Review of Systems (Ammie Eversole LPN; 624THL D34-534 PM) General Not Present- Appetite Loss, Chills, Fatigue, Fever, Night Sweats, Weight Gain and Weight Loss. Skin Present- Dryness and New Lesions. Not Present- Change in Wart/Mole, Hives, Jaundice, Non-Healing Wounds, Rash and Ulcer. HEENT Present- Hearing  Loss and Seasonal Allergies. Not Present- Earache, Hoarseness, Nose Bleed, Oral Ulcers, Ringing in the Ears, Sinus Pain, Sore Throat, Visual Disturbances, Wears glasses/contact lenses and Yellow Eyes. Respiratory Present- Chronic Cough, Difficulty Breathing and Wheezing. Not Present- Bloody sputum and Snoring. Breast Not Present- Breast Mass, Breast Pain, Nipple Discharge and Skin Changes. Cardiovascular Present- Leg Cramps and Shortness of Breath. Not Present- Chest Pain, Difficulty Breathing Lying Down, Palpitations, Rapid Heart Rate and Swelling of Extremities. Gastrointestinal Present- Difficulty Swallowing and Rectal Pain. Not Present- Abdominal Pain, Bloating, Bloody Stool, Change in Bowel Habits, Chronic diarrhea, Constipation, Excessive gas, Gets full quickly at meals, Hemorrhoids, Indigestion, Nausea and Vomiting. Male Genitourinary Not Present- Blood in Urine, Change in Urinary Stream, Frequency, Impotence, Nocturia, Painful Urination, Urgency and Urine Leakage. Musculoskeletal Present- Back Pain and Muscle Pain. Not Present- Joint Pain, Joint Stiffness, Muscle Weakness and Swelling of Extremities. Neurological Present- Trouble walking and Weakness. Not Present- Decreased Memory, Fainting, Headaches, Numbness, Seizures, Tingling and Tremor. Psychiatric Not Present- Anxiety, Bipolar, Change in Sleep Pattern, Depression, Fearful and Frequent crying. Endocrine Not Present- Cold Intolerance, Excessive Hunger, Hair Changes, Heat Intolerance, Hot flashes and New Diabetes. Hematology Present- Blood Thinners. Not Present- Easy Bruising, Excessive bleeding, Gland problems, HIV and Persistent Infections.  Vitals (Ammie Eversole LPN; 624THL [id number] PM) 04/11/2016 2:02 PM Weight: 188.4 lb Height: 74in Body Surface Area: 2.12 m Body Mass Index: 24.19 kg/m  Temp.: 98.59F(Oral)  Pulse: 58 (Regular)  BP: 132/70 (Sitting, Left Arm, Standard)      Physical Exam [name] Key K. Emanuel Dowson MD;  04/11/2016 3:33 PM)  The physical exam findings are as follows: Note:WDWN in NAD Back - diffuse scarring from chronic dermatitis Upper midline back - 3 cm protruding soft subcutaneous mass with central punctate opening; no drainage; no  cellulitis    Assessment & Plan [name]. [name] Spielmann MD; 04/11/2016 2:28 PM)  SEBACEOUS CYST (L72.3) Impression: 3 cm; upper back midline  Current Plans Schedule for Surgery - Excision of sebaceous cyst - upper back. The surgical procedure has been discussed with the patient. Potential risks, benefits, alternative treatments, and expected outcomes have been explained. All of the patient's questions at this time have been answered. The likelihood of reaching the patient's treatment goal is good. The patient understand the proposed surgical procedure and wishes to proceed.   Cardiac clearance by Dr. Murrell Redden. Georgette Dover, MD, [facility]  General/ Trauma Surgery  04/11/2016 3:34 PM

## 2016-05-10 NOTE — Anesthesia Preprocedure Evaluation (Signed)
Anesthesia Evaluation  Patient identified by MRN, date of birth, ID band Patient awake    Reviewed: Allergy & Precautions, NPO status , Patient's Chart, lab work & pertinent test results  Airway Mallampati: II       Dental no notable dental hx.    Pulmonary Current Smoker,    Pulmonary exam normal        Cardiovascular hypertension, Normal cardiovascular exam     Neuro/Psych    GI/Hepatic   Endo/Other    Renal/GU      Musculoskeletal   Abdominal Normal abdominal exam  (+)   Peds  Hematology   Anesthesia Other Findings  for excision of sebaceous cyst upper back on 05/10/2016 by Dr. Georgette Dover.  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   PCP is Dr. Arnoldo Morale. Cardiologist is Dr. Aundra Dubin. He was also seen by Dr. Acie Fredrickson 6/107 for chest pain. Stress test showed a small defect along the LAD territory consistent with his known D1 occlusion. Continue medical therapy was recommended. He was seen by [name], [place] on 04/25/2016 for preoperative evaluation and was felt "acceptable risk for the procedure."  Meds include albuterol, Lipitor, doxycycline, Eliquis, Advair, Pro-air. Not on ASA secondary to need for full-dose anticoagulation. No BB with recent cocaine use. He reports being instructed to hold Eliquis for 24 hours prior to surgery.   BP (!) 146/87   Pulse 76   Temp 36.5 C   Resp 18   Ht 6\' 2"  (1.88 m)   Wt 194 lb 6.4 oz (88.2 kg)   SpO2 100%   BMI 24.96 kg/m   EKG 04/25/16: SR with occasional PVCs.   Nuclear stress test 09/08/15:  There was no ST segment deviation noted during stress.  No T wave inversion was noted during stress.  Defect 1: There is  a small defect of moderate severity in the LAD territory.  Findings consistent with ischemia.  The left ventricular ejection fraction is normal (55-65%).  This is an intermediate risk study.  Cardiac cath 01/04/15:  Prox RCA lesion, 30% stenosed.  Mid RCA to Dist RCA lesion, 30% stenosed.  Ost Cx to Prox Cx lesion, 20% stenosed.  Ramus lesion, 60% stenosed.  Prox LAD to Mid LAD lesion, 50% stenosed.  Ost 1st Diag to 1st Diag lesion, 100% stenosed. The lesion was previously treated with a bare metal stentone to two years ago.  The left ventricular systolic function is normal. 1. Double vessel CAD  2. Moderate stenosis moderate caliber Ramus Intermediate branch, does not appear to be flow limiting.  3. Mild to moderate disease in the LAD, RCA and Circumflex.  4. Chronic total occlusion bare metal stent in Diagonal branch. 5. Low normal LV systolic function. Recommendations: Continue medical management of CAD. He admits today to continued tobacco abuse and medication non-compliance. He responded poorly to bare metal stenting in the past. I would maximize his anti-anginal medications and encourage tobacco cessation. He can be discharged home later today. Follow up with Dr. Loralie Champagne following discharge.   Echo 01/02/15: Study Conclusions - Left ventricle: The cavity size was normal. There was mild concentric hypertrophy. Systolic function was normal. The estimated ejection fraction was in the range of 55% to 60%. Wall motion was normal; there were  no regional wall motion abnormalities. Doppler parameters are consistent with abnormal left ventricular relaxation (grade 1 diastolic dysfunction). There was no evidence of elevated ventricular filling pressure by Doppler parameters. - Aortic valve: Trileaflet; normal thickness leaflets. There was no regurgitation. - Aortic root: The aortic root was normal in size. - Mitral valve: Structurally normal valve. There  was no regurgitation. - Right ventricle: The cavity size was normal. Wall thickness was normal. Systolic function was normal. - Right atrium: The atrium was normal in size. - Tricuspid valve: There was trivial regurgitation. - Pulmonic valve: There was no regurgitation. - Pulmonary arteries: Systolic pressure was within the normal range. - Inferior vena cava: The vessel was normal in size. - Pericardium, extracardiac: There was no pericardial effusion.  BLE Venous Duplex 11/06/15: Summary: - Findings consistent with acute deep vein thrombosis involving the right profunda femoris vein, right femoral vein, left common femoral vein, and left profunda femoris vein. - Chronic deep vein thrombosis involving the right posterial tibial vein, left femoral vein, left saphenofemoral junction, and left popliteal vein.  PCXR 02/01/16: IMPRESSION: No infiltrate or edema  CTA Chest 11/06/15: IMPRESSION: 1. Negative for acute PE or thoracic aortic dissection. 2. Bronchiectasis and bronchitis with scattered areas of mucous plugging. 3. Atherosclerosis, including coronary artery disease. Please note that although the presence of coronary artery calcium documents the presence of coronary artery disease, the severity of this disease and any potential stenosis cannot be assessed on this non-gated CT examination. Assessment for potential risk factor modification, dietary therapy or pharmacologic therapy may be warranted, if clinically indicated.  Normal spirometry 05/28/12.   Preoperative labs noted. Cr 0.75. H/H 12.0/37.5. PLT 139. LFTs WNL 03/28/16.   If no acute changes then I anticipate that he can proceed as planned.  [facility] Short Stay Center/Anesthesiology Phone [phone] 05/09/2016 12:32 PM     Reproductive/Obstetrics                             Anesthesia Physical Anesthesia Plan  ASA: III  Anesthesia Plan:  General   Post-op Pain Management:    Induction:   Airway Management Planned: LMA  Additional Equipment:   Intra-op Plan:   Post-operative Plan:   Informed Consent:   Plan Discussed with: CRNA and Surgeon  Anesthesia Plan Comments:         Anesthesia Quick Evaluation

## 2016-05-11 ENCOUNTER — Encounter (HOSPITAL_COMMUNITY): Payer: Self-pay | Admitting: Surgery

## 2016-05-12 ENCOUNTER — Other Ambulatory Visit: Payer: Self-pay | Admitting: Pharmacist

## 2016-05-12 MED ORDER — VENTOLIN HFA 108 (90 BASE) MCG/ACT IN AERS: 2.0000 | INHALATION_SPRAY | Freq: Four times a day (QID) | RESPIRATORY_TRACT | 2 refills | Status: DC | PRN

## 2016-05-15 ENCOUNTER — Inpatient Hospital Stay (HOSPITAL_COMMUNITY)
Admission: EM | Admit: 2016-05-15 | Discharge: 2016-05-20 | DRG: 191 | Disposition: A | Discharge status: Home / Self Care | Payer: Medicare Other | Attending: Internal Medicine | Admitting: Internal Medicine

## 2016-05-15 ENCOUNTER — Emergency Department (HOSPITAL_COMMUNITY): Payer: Medicare Other

## 2016-05-15 ENCOUNTER — Encounter (HOSPITAL_COMMUNITY): Payer: Self-pay | Admitting: Emergency Medicine

## 2016-05-15 DIAGNOSIS — I2510 Atherosclerotic heart disease of native coronary artery without angina pectoris: Secondary | ICD-10-CM | POA: Diagnosis present

## 2016-05-15 DIAGNOSIS — I5032 Chronic diastolic (congestive) heart failure: Secondary | ICD-10-CM | POA: Diagnosis present

## 2016-05-15 DIAGNOSIS — J383 Other diseases of vocal cords: Secondary | ICD-10-CM | POA: Diagnosis present

## 2016-05-15 DIAGNOSIS — Z955 Presence of coronary angioplasty implant and graft: Secondary | ICD-10-CM

## 2016-05-15 DIAGNOSIS — J438 Other emphysema: Secondary | ICD-10-CM

## 2016-05-15 DIAGNOSIS — I110 Hypertensive heart disease with heart failure: Secondary | ICD-10-CM | POA: Diagnosis present

## 2016-05-15 DIAGNOSIS — F17210 Nicotine dependence, cigarettes, uncomplicated: Secondary | ICD-10-CM | POA: Diagnosis present

## 2016-05-15 DIAGNOSIS — J441 Chronic obstructive pulmonary disease with (acute) exacerbation: Secondary | ICD-10-CM | POA: Diagnosis not present

## 2016-05-15 DIAGNOSIS — R739 Hyperglycemia, unspecified: Secondary | ICD-10-CM | POA: Diagnosis present

## 2016-05-15 DIAGNOSIS — R079 Chest pain, unspecified: Secondary | ICD-10-CM | POA: Diagnosis not present

## 2016-05-15 DIAGNOSIS — Z86718 Personal history of other venous thrombosis and embolism: Secondary | ICD-10-CM

## 2016-05-15 DIAGNOSIS — Z8701 Personal history of pneumonia (recurrent): Secondary | ICD-10-CM

## 2016-05-15 DIAGNOSIS — K219 Gastro-esophageal reflux disease without esophagitis: Secondary | ICD-10-CM | POA: Diagnosis not present

## 2016-05-15 DIAGNOSIS — Z825 Family history of asthma and other chronic lower respiratory diseases: Secondary | ICD-10-CM

## 2016-05-15 DIAGNOSIS — Z720 Tobacco use: Secondary | ICD-10-CM | POA: Diagnosis present

## 2016-05-15 DIAGNOSIS — Z86711 Personal history of pulmonary embolism: Secondary | ICD-10-CM | POA: Diagnosis not present

## 2016-05-15 DIAGNOSIS — Z7951 Long term (current) use of inhaled steroids: Secondary | ICD-10-CM

## 2016-05-15 DIAGNOSIS — J4531 Mild persistent asthma with (acute) exacerbation: Secondary | ICD-10-CM | POA: Diagnosis present

## 2016-05-15 DIAGNOSIS — R05 Cough: Secondary | ICD-10-CM | POA: Diagnosis not present

## 2016-05-15 DIAGNOSIS — J209 Acute bronchitis, unspecified: Secondary | ICD-10-CM | POA: Diagnosis present

## 2016-05-15 DIAGNOSIS — Z7901 Long term (current) use of anticoagulants: Secondary | ICD-10-CM

## 2016-05-15 DIAGNOSIS — D649 Anemia, unspecified: Secondary | ICD-10-CM | POA: Diagnosis present

## 2016-05-15 DIAGNOSIS — G894 Chronic pain syndrome: Secondary | ICD-10-CM | POA: Diagnosis present

## 2016-05-15 DIAGNOSIS — Z79899 Other long term (current) drug therapy: Secondary | ICD-10-CM

## 2016-05-15 DIAGNOSIS — R0602 Shortness of breath: Secondary | ICD-10-CM | POA: Diagnosis not present

## 2016-05-15 DIAGNOSIS — I10 Essential (primary) hypertension: Secondary | ICD-10-CM | POA: Diagnosis present

## 2016-05-15 DIAGNOSIS — I252 Old myocardial infarction: Secondary | ICD-10-CM

## 2016-05-15 LAB — COMPREHENSIVE METABOLIC PANEL
ALT: 34 U/L (ref 17–63)
AST: 50 U/L — AB (ref 15–41)
Albumin: 3.7 g/dL (ref 3.5–5.0)
Alkaline Phosphatase: 74 U/L (ref 38–126)
Anion gap: 14 (ref 5–15)
BUN: 5 mg/dL — ABNORMAL LOW (ref 6–20)
CHLORIDE: 104 mmol/L (ref 101–111)
CO2: 21 mmol/L — AB (ref 22–32)
Calcium: 9.4 mg/dL (ref 8.9–10.3)
Creatinine, Ser: 0.66 mg/dL (ref 0.61–1.24)
Glucose, Bld: 109 mg/dL — ABNORMAL HIGH (ref 65–99)
POTASSIUM: 3.4 mmol/L — AB (ref 3.5–5.1)
Sodium: 139 mmol/L (ref 135–145)
Total Bilirubin: 1 mg/dL (ref 0.3–1.2)
Total Protein: 6.9 g/dL (ref 6.5–8.1)

## 2016-05-15 LAB — I-STAT ARTERIAL BLOOD GAS, ED
Acid-base deficit: 3 mmol/L — ABNORMAL HIGH (ref 0.0–2.0)
Bicarbonate: 22 mmol/L (ref 20.0–28.0)
O2 Saturation: 93 %
PCO2 ART: 36.9 mmHg (ref 32.0–48.0)
Patient temperature: 98.6
TCO2: 23 mmol/L (ref 0–100)
pH, Arterial: 7.383 (ref 7.350–7.450)
pO2, Arterial: 68 mmHg — ABNORMAL LOW (ref 83.0–108.0)

## 2016-05-15 LAB — CBC WITH DIFFERENTIAL/PLATELET
BASOS ABS: 0 10*3/uL (ref 0.0–0.1)
Basophils Relative: 0 %
EOS PCT: 14 %
Eosinophils Absolute: 0.6 10*3/uL (ref 0.0–0.7)
HCT: 33.9 % — ABNORMAL LOW (ref 39.0–52.0)
Hemoglobin: 11.5 g/dL — ABNORMAL LOW (ref 13.0–17.0)
LYMPHS PCT: 36 %
Lymphs Abs: 1.6 10*3/uL (ref 0.7–4.0)
MCH: 30.7 pg (ref 26.0–34.0)
MCHC: 33.9 g/dL (ref 30.0–36.0)
MCV: 90.6 fL (ref 78.0–100.0)
MONO ABS: 0.5 10*3/uL (ref 0.1–1.0)
Monocytes Relative: 12 %
Neutro Abs: 1.6 10*3/uL — ABNORMAL LOW (ref 1.7–7.7)
Neutrophils Relative %: 38 %
PLATELETS: 152 10*3/uL (ref 150–400)
RBC: 3.74 MIL/uL — AB (ref 4.22–5.81)
RDW: 13 % (ref 11.5–15.5)
WBC: 4.3 10*3/uL (ref 4.0–10.5)

## 2016-05-15 LAB — I-STAT TROPONIN, ED: TROPONIN I, POC: 0 ng/mL (ref 0.00–0.08)

## 2016-05-15 MED ORDER — METHYLPREDNISOLONE SODIUM SUCC 125 MG IJ SOLR
125.0000 mg | Freq: Once | INTRAMUSCULAR | Status: AC
  Administered 2016-05-16: 125 mg via INTRAVENOUS
  Filled 2016-05-15: qty 2

## 2016-05-15 MED ORDER — SODIUM CHLORIDE 0.9 % IV SOLN
INTRAVENOUS | Status: DC
  Administered 2016-05-16: 02:00:00 via INTRAVENOUS

## 2016-05-15 MED ORDER — ALBUTEROL (5 MG/ML) CONTINUOUS INHALATION SOLN
10.0000 mg/h | INHALATION_SOLUTION | Freq: Once | RESPIRATORY_TRACT | Status: AC
  Administered 2016-05-15: 10 mg/h via RESPIRATORY_TRACT
  Filled 2016-05-15: qty 20

## 2016-05-15 MED ORDER — IPRATROPIUM BROMIDE 0.02 % IN SOLN
0.5000 mg | Freq: Once | RESPIRATORY_TRACT | Status: AC
  Administered 2016-05-15: 0.5 mg via RESPIRATORY_TRACT
  Filled 2016-05-15: qty 2.5

## 2016-05-15 MED ORDER — SODIUM CHLORIDE 0.9 % IV BOLUS (SEPSIS)
1000.0000 mL | Freq: Once | INTRAVENOUS | Status: AC
  Administered 2016-05-16: 1000 mL via INTRAVENOUS

## 2016-05-15 NOTE — ED Triage Notes (Signed)
Pt to ED with c/o asthma with shortness of breath.  St's he has used his inhaler and taken tx's at home without relief.  Last treatment PTA to ED. Also productive couogh

## 2016-05-15 NOTE — ED Provider Notes (Addendum)
[facility] DEPT Provider Note   CSN: [id number] Arrival date & time: 05/15/16  2053     History   Chief Complaint Chief Complaint  Patient presents with  . Asthma    HPI [name] is a 56 y.o. male.  Patient has a history of asthma. Started wheezing a couple days ago. His symptoms have been gradually getting worse. He is feeling more and more short of breath. He has history of being on BiPAP in the past. He denies any history of intubations     Asthma  This is a recurrent problem. The current episode started more than 2 days ago. The problem occurs constantly. The problem has been gradually worsening. Associated symptoms include shortness of breath. Pertinent negatives include no chest pain, no abdominal pain and no headaches. Nothing aggravates the symptoms. Nothing relieves the symptoms. Treatments tried: inhalers and breathing treatments. The treatment provided no relief.    Past Medical History:  Diagnosis Date  . Asthma   . CAD (coronary artery disease)    a. 09/2013 NSTEMI/PCI: LM nl, LAD 40-50%, D1 100 (2.25 x 28 Vision BMS), LCX min irregs, RI 60-70, 30, RCA 40-50/50-43ms/p, EF 45-50%.  b. cath 12/2014 -occulded BMS in diag, 50% pro to mid LAD, 60% ramus, 30% RCA   . Chest pain 08/2015  . Chronic diastolic CHF (congestive heart failure) ([place])    a. 09/2014 EF 45-50% by LV gram;  b. 01/2014 Echo: EF 55-60%, Gr 1 DD.  Marland Kitchen Cocaine abuse   . COPD (chronic obstructive pulmonary disease) ([place])   . DVT (deep venous thrombosis) ([place])    "I had ~ 10 in each leg"  . Essential hypertension   . Hepatitis C    "clear free over a year now"  . History of pneumonia   . Osteoarthritis    a. hands and toes.  . Pulmonary embolism ([place])    a. 2012 - s/p IVC filter;  b. prev on eliquis - noncompliant.  . Shortness of breath dyspnea   . Sinus headache   . Tobacco abuse     Patient Active Problem List   Diagnosis Date Noted  . Chronic anticoagulation 02/01/2016  .  Malnutrition of moderate degree 06/05/2015  . COPD exacerbation ([place]) 06/03/2015  . Liver fibrosis ([place]) 04/08/2015  . COPD (chronic obstructive pulmonary disease) ([place]) 01/01/2015  . Essential hypertension 01/01/2015  . Unstable angina ([place]) 01/01/2015  . History of coronary artery disease   . Cocaine abuse 12/01/2014  . Pleuritic chest pain 12/01/2014  . Chronic diastolic heart failure ([place]) 12/01/2014  . QT prolongation 12/01/2014  . Chronic hepatitis C without hepatic coma ([place]) 06/18/2014  . HTN (hypertension) 04/27/2014  . Coronary artery disease involving native coronary artery of native heart without angina pectoris   . CAD (coronary artery disease) 10/21/2013  . NSTEMI (non-ST elevated myocardial infarction) ([place]) 10/21/2013  . Anemia 06/20/2013  . Regular alcohol consumption 06/18/2013  . History of DVT (deep vein thrombosis) 02/21/2013  . Thrombocytopenia ([place]) 08/09/2012  . Tobacco abuse 08/09/2012  . History of noncompliance with medical treatment 03/24/2012  . Atopic dermatitis 03/24/2012  . History of pulmonary embolism 02/10/2012    Past Surgical History:  Procedure Laterality Date  . CARDIAC CATHETERIZATION N/A 01/04/2015   Procedure: Left Heart Cath and Coronary Angiography;  Surgeon: Burnell Blanks, MD;  Location: [facility];  Service: Cardiovascular;  Laterality: N/A;  . CORONARY ANGIOPLASTY WITH STENT PLACEMENT  10/21/13   BMS to D1  .  CYST EXCISION N/A 05/10/2016   Procedure: EXCISION OF SEBACEOUS CYST UPPER BACK;  Surgeon: Donnie Mesa, MD;  Location: [facility];  Service: General;  Laterality: N/A;  . LEFT HEART CATHETERIZATION WITH CORONARY ANGIOGRAM N/A 10/21/2013   Procedure: LEFT HEART CATHETERIZATION WITH CORONARY ANGIOGRAM;  Surgeon: Leonie Man, MD;  Location: [facility] CATH LAB;  Service: Cardiovascular;  Laterality: N/A;  . VENA CAVA FILTER PLACEMENT  " ~ 2012       Home Medications    Prior to Admission medications   Medication Sig  Start Date End Date Taking? Authorizing Provider  albuterol (PROVENTIL) (5 MG/ML) 0.5% nebulizer solution Take 2.5 mg by nebulization every 6 (six) hours as needed for wheezing or shortness of breath.   Yes Historical Provider, MD  atorvastatin (LIPITOR) 40 MG tablet Take 1 tablet (40 mg total) by mouth daily. 03/28/16  Yes Arnoldo Morale, MD  doxycycline (VIBRAMYCIN) 100 MG capsule Take 100 mg by mouth daily. Take one capsule by mouth daily with food   Yes Historical Provider, MD  ELIQUIS 2.5 MG TABS tablet Take 2 tablets (5 mg total) by mouth 2 (two) times daily. 03/28/16  Yes Arnoldo Morale, MD  Fluticasone-Salmeterol (ADVAIR DISKUS) 250-50 MCG/DOSE AEPB Inhale 1 puff into the lungs 2 (two) times daily. 03/28/16  Yes Arnoldo Morale, MD  HYDROcodone-acetaminophen (NORCO/VICODIN) 5-325 MG tablet Take 1-2 tablets by mouth every 6 (six) hours as needed for moderate pain. 05/10/16  Yes Donnie Mesa, MD  VENTOLIN HFA 108 (90 Base) MCG/ACT inhaler Inhale 2 puffs into the lungs every 6 (six) hours as needed for wheezing or shortness of breath. 05/12/16  Yes Arnoldo Morale, MD    Family History Family History  Problem Relation Age of Onset  . Asthma Mother   . Allergies Mother   . Allergies Sister   . Allergies Brother   . Deep vein thrombosis Brother     two brothers with recurrent DVT  . Heart attack Father     a.60s b. deceased in his 7s  . Coronary artery disease Father     Social History Social History  Substance Use Topics  . Smoking status: Current Every Day Smoker    Packs/day: 0.25    Years: 38.00    Types: Cigarettes  . Smokeless tobacco: Never Used     Comment: trying to quit 3-4 cigarettes per day  . Alcohol use 4.2 oz/week    6 Cans of beer, 1 Shots of liquor per week     Comment: a couple times weekly     Allergies   No known allergies   Review of Systems Review of Systems  Respiratory: Positive for shortness of breath.   Cardiovascular: Negative for chest pain.    Gastrointestinal: Negative for abdominal pain.  Neurological: Negative for headaches.  All other systems reviewed and are negative.    Physical Exam Updated Vital Signs BP 138/93 (BP Location: Left Arm)   Pulse 120   Temp 98.6 F (37 C) (Oral)   Resp 22   Ht 6\' 2"  (1.88 m)   Wt 88 kg   SpO2 99%   BMI 24.91 kg/m   Physical Exam  Constitutional: He appears well-developed and well-nourished. No distress.  HENT:  Head: Normocephalic and atraumatic.  Right Ear: External ear normal.  Left Ear: External ear normal.  Eyes: Conjunctivae are normal. Right eye exhibits no discharge. Left eye exhibits no discharge. No scleral icterus.  Neck: Neck supple. No tracheal deviation present.  Cardiovascular: Normal rate,  regular rhythm and intact distal pulses.   Pulmonary/Chest: Accessory muscle usage present. No stridor. Tachypnea noted. No respiratory distress. He has wheezes. He has no rales.  Abdominal: Soft. Bowel sounds are normal. He exhibits no distension. There is no tenderness. There is no rebound and no guarding.  Musculoskeletal: He exhibits no edema or tenderness.  Neurological: He is alert. He has normal strength. No cranial nerve deficit (no facial droop, extraocular movements intact, no slurred speech) or sensory deficit. He exhibits normal muscle tone. He displays no seizure activity. Coordination normal.  Skin: Skin is warm and dry. No rash noted.  Psychiatric: He has a normal mood and affect.  Nursing note and vitals reviewed.    ED Treatments / Results  Labs (all labs ordered are listed, but only abnormal results are displayed) Labs Reviewed  CBC WITH DIFFERENTIAL/PLATELET - Abnormal; Notable for the following:       Result Value   RBC 3.74 (*)    Hemoglobin 11.5 (*)    HCT 33.9 (*)    Neutro Abs 1.6 (*)    All other components within normal limits  COMPREHENSIVE METABOLIC PANEL - Abnormal; Notable for the following:    Potassium 3.4 (*)    CO2 21 (*)     Glucose, Bld 109 (*)    BUN 5 (*)    AST 50 (*)    All other components within normal limits  I-STAT ARTERIAL BLOOD GAS, ED - Abnormal; Notable for the following:    pO2, Arterial 68.0 (*)    Acid-base deficit 3.0 (*)    All other components within normal limits  I-STAT TROPOININ, ED    EKG  EKG Interpretation  Date/Time:  Monday May 15 2016 21:22:37 EST Ventricular Rate:  110 PR Interval:  182 QRS Duration: 82 QT Interval:  344 QTC Calculation: 465 R Axis:   54 Text Interpretation:  Sinus tachycardia Biatrial enlargement Abnormal ECG Since last tracing rate faster Confirmed by Levonia Wolfley  MD-J, Faithann Natal [id number]) on 05/15/2016 11:01:32 PM       Radiology Dg Chest 2 View  Result Date: 05/15/2016 CLINICAL DATA:  Left chest pain, shortness of breath, productive cough and chills for 5 days. EXAM: CHEST  2 VIEW COMPARISON:  PA and lateral chest 01/07/2016. FINDINGS: The lungs are clear. Heart size is normal. No pneumothorax or pleural effusion. Aortic atherosclerosis noted. IMPRESSION: No acute disease. Electronically Signed   By: Inge Rise M.D.   On: 05/15/2016 21:43    Procedures .Critical Care Performed by: Dorie Rank Authorized by: Dorie Rank   Critical care provider statement:    Critical care time (minutes):  35   Critical care was necessary to treat or prevent imminent or life-threatening deterioration of the following conditions: asthma exacerbation.   Critical care was time spent personally by me on the following activities:  Discussions with consultants, evaluation of patient's response to treatment, examination of patient, ordering and performing treatments and interventions, ordering and review of laboratory studies, ordering and review of radiographic studies, pulse oximetry, re-evaluation of patient's condition, obtaining history from patient or surrogate and review of old charts    (including critical care time)  Medications Ordered in ED Medications  sodium  chloride 0.9 % bolus 1,000 mL (not administered)    And  0.9 %  sodium chloride infusion (not administered)  methylPREDNISolone sodium succinate (SOLU-MEDROL) 125 mg/2 mL injection 125 mg (not administered)  albuterol (PROVENTIL,VENTOLIN) solution continuous neb (10 mg/hr Nebulization Given 05/15/16 2319)  morphine 4  MG/ML injection 4 mg (not administered)  ipratropium (ATROVENT) nebulizer solution 0.5 mg (0.5 mg Nebulization Given 05/15/16 2318)     Initial Impression / Assessment and Plan / ED Course  I have reviewed the triage vital signs and the nursing notes.  Pertinent labs & imaging results that were available during my care of the patient were reviewed by me and considered in my medical decision making (see chart for details).  Clinical Course as of May 16 4  Mon May 15, 2016  2348 Still wheezing significantly despite hour long neb.  Frequently coughing.  O2 sat remains 100%.  Pt does not appear fatigued.  Will start on bipap  [JK]  Tue May 16, 2016  0005 Requested pain medications.  Chest is hurting from coughing  [JK]    Clinical Course User Index [JK] Dorie Rank, MD   Patient presented to the emergency room with an asthma exacerbation. In the emergency room he continues to cough and wheeze significantly despite an hour-long neb treatment and steroids. I have ordered BiPAP to help with his work of breathing.  He does not appear to be fatigued requiring intubation. I will consult the medical service for admission.  Will need close monitoring, stepdwon.  Final Clinical Impressions(s) / ED Diagnoses   Final diagnoses:  COPD exacerbation ([place])      Dorie Rank, MD 05/15/16 2352    Dorie Rank, MD 05/16/16 [id number]

## 2016-05-16 ENCOUNTER — Encounter (HOSPITAL_COMMUNITY): Payer: Self-pay | Admitting: Internal Medicine

## 2016-05-16 DIAGNOSIS — Z955 Presence of coronary angioplasty implant and graft: Secondary | ICD-10-CM | POA: Diagnosis not present

## 2016-05-16 DIAGNOSIS — I110 Hypertensive heart disease with heart failure: Secondary | ICD-10-CM | POA: Diagnosis present

## 2016-05-16 DIAGNOSIS — D649 Anemia, unspecified: Secondary | ICD-10-CM | POA: Diagnosis present

## 2016-05-16 DIAGNOSIS — Z86718 Personal history of other venous thrombosis and embolism: Secondary | ICD-10-CM

## 2016-05-16 DIAGNOSIS — Z825 Family history of asthma and other chronic lower respiratory diseases: Secondary | ICD-10-CM | POA: Diagnosis not present

## 2016-05-16 DIAGNOSIS — Z7951 Long term (current) use of inhaled steroids: Secondary | ICD-10-CM | POA: Diagnosis not present

## 2016-05-16 DIAGNOSIS — F17210 Nicotine dependence, cigarettes, uncomplicated: Secondary | ICD-10-CM | POA: Diagnosis present

## 2016-05-16 DIAGNOSIS — K219 Gastro-esophageal reflux disease without esophagitis: Secondary | ICD-10-CM | POA: Diagnosis present

## 2016-05-16 DIAGNOSIS — R0600 Dyspnea, unspecified: Secondary | ICD-10-CM | POA: Diagnosis not present

## 2016-05-16 DIAGNOSIS — I5032 Chronic diastolic (congestive) heart failure: Secondary | ICD-10-CM | POA: Diagnosis present

## 2016-05-16 DIAGNOSIS — J441 Chronic obstructive pulmonary disease with (acute) exacerbation: Secondary | ICD-10-CM | POA: Diagnosis not present

## 2016-05-16 DIAGNOSIS — G894 Chronic pain syndrome: Secondary | ICD-10-CM | POA: Diagnosis present

## 2016-05-16 DIAGNOSIS — Z7901 Long term (current) use of anticoagulants: Secondary | ICD-10-CM | POA: Diagnosis not present

## 2016-05-16 DIAGNOSIS — Z8701 Personal history of pneumonia (recurrent): Secondary | ICD-10-CM | POA: Diagnosis not present

## 2016-05-16 DIAGNOSIS — J209 Acute bronchitis, unspecified: Secondary | ICD-10-CM | POA: Diagnosis present

## 2016-05-16 DIAGNOSIS — R739 Hyperglycemia, unspecified: Secondary | ICD-10-CM | POA: Diagnosis present

## 2016-05-16 DIAGNOSIS — I2510 Atherosclerotic heart disease of native coronary artery without angina pectoris: Secondary | ICD-10-CM | POA: Diagnosis present

## 2016-05-16 DIAGNOSIS — I252 Old myocardial infarction: Secondary | ICD-10-CM | POA: Diagnosis not present

## 2016-05-16 DIAGNOSIS — J383 Other diseases of vocal cords: Secondary | ICD-10-CM | POA: Diagnosis present

## 2016-05-16 DIAGNOSIS — J4531 Mild persistent asthma with (acute) exacerbation: Secondary | ICD-10-CM | POA: Diagnosis present

## 2016-05-16 DIAGNOSIS — D500 Iron deficiency anemia secondary to blood loss (chronic): Secondary | ICD-10-CM | POA: Diagnosis not present

## 2016-05-16 DIAGNOSIS — Z86711 Personal history of pulmonary embolism: Secondary | ICD-10-CM | POA: Diagnosis not present

## 2016-05-16 DIAGNOSIS — Z79899 Other long term (current) drug therapy: Secondary | ICD-10-CM | POA: Diagnosis not present

## 2016-05-16 LAB — COMPREHENSIVE METABOLIC PANEL
ALBUMIN: 3.5 g/dL (ref 3.5–5.0)
ALT: 35 U/L (ref 17–63)
ANION GAP: 12 (ref 5–15)
AST: 54 U/L — AB (ref 15–41)
Alkaline Phosphatase: 70 U/L (ref 38–126)
BILIRUBIN TOTAL: 0.7 mg/dL (ref 0.3–1.2)
BUN: 8 mg/dL (ref 6–20)
CHLORIDE: 107 mmol/L (ref 101–111)
CO2: 20 mmol/L — AB (ref 22–32)
Calcium: 8.8 mg/dL — ABNORMAL LOW (ref 8.9–10.3)
Creatinine, Ser: 0.77 mg/dL (ref 0.61–1.24)
GFR calc Af Amer: >60 mL/min (ref >60)
GFR calc non Af Amer: >60 mL/min (ref >60)
GLUCOSE: 198 mg/dL — AB (ref 65–99)
POTASSIUM: 3.8 mmol/L (ref 3.5–5.1)
SODIUM: 139 mmol/L (ref 135–145)
Total Protein: 6.7 g/dL (ref 6.5–8.1)

## 2016-05-16 LAB — CBC
HEMATOCRIT: 32.4 % — AB (ref 39.0–52.0)
Hemoglobin: 10.7 g/dL — ABNORMAL LOW (ref 13.0–17.0)
MCH: 30.5 pg (ref 26.0–34.0)
MCHC: 33 g/dL (ref 30.0–36.0)
MCV: 92.3 fL (ref 78.0–100.0)
Platelets: 117 10*3/uL — ABNORMAL LOW (ref 150–400)
RBC: 3.51 MIL/uL — ABNORMAL LOW (ref 4.22–5.81)
RDW: 13.4 % (ref 11.5–15.5)
WBC: 3.8 10*3/uL — AB (ref 4.0–10.5)

## 2016-05-16 LAB — GLUCOSE, CAPILLARY
GLUCOSE-CAPILLARY: 249 mg/dL — AB (ref 65–99)
GLUCOSE-CAPILLARY: 198 mg/dL — AB (ref 65–99)
Glucose-Capillary: 230 mg/dL — ABNORMAL HIGH (ref 65–99)
Glucose-Capillary: 268 mg/dL — ABNORMAL HIGH (ref 65–99)
Glucose-Capillary: 223 mg/dL — ABNORMAL HIGH (ref 65–99)

## 2016-05-16 LAB — MRSA PCR SCREENING: MRSA by PCR: NEGATIVE

## 2016-05-16 LAB — MAGNESIUM: MAGNESIUM: 1.8 mg/dL (ref 1.7–2.4)

## 2016-05-16 LAB — PHOSPHORUS: Phosphorus: 2.9 mg/dL (ref 2.5–4.6)

## 2016-05-16 LAB — TROPONIN I

## 2016-05-16 LAB — BRAIN NATRIURETIC PEPTIDE: B NATRIURETIC PEPTIDE 5: 80.4 pg/mL (ref 0.0–100.0)

## 2016-05-16 MED ORDER — ORAL CARE MOUTH RINSE
15.0000 mL | Freq: Two times a day (BID) | OROMUCOSAL | Status: DC
  Administered 2016-05-16: 15 mL via OROMUCOSAL

## 2016-05-16 MED ORDER — SODIUM CHLORIDE 0.9% FLUSH
3.0000 mL | Freq: Two times a day (BID) | INTRAVENOUS | Status: DC
  Administered 2016-05-16 – 2016-05-19 (×5): 3 mL via INTRAVENOUS

## 2016-05-16 MED ORDER — POTASSIUM CHLORIDE IN NACL 40-0.9 MEQ/L-% IV SOLN
INTRAVENOUS | Status: DC
  Administered 2016-05-16: 88 mL/h via INTRAVENOUS
  Filled 2016-05-16 (×3): qty 1000

## 2016-05-16 MED ORDER — INSULIN ASPART 100 UNIT/ML ~~LOC~~ SOLN
0.0000 [IU] | Freq: Every day | SUBCUTANEOUS | Status: DC
  Administered 2016-05-16: 2 [IU] via SUBCUTANEOUS

## 2016-05-16 MED ORDER — MORPHINE SULFATE (PF) 2 MG/ML IV SOLN
2.0000 mg | Freq: Once | INTRAVENOUS | Status: AC
  Administered 2016-05-16: 2 mg via INTRAVENOUS

## 2016-05-16 MED ORDER — MORPHINE SULFATE (PF) 4 MG/ML IV SOLN
4.0000 mg | Freq: Once | INTRAVENOUS | Status: AC
  Administered 2016-05-16: 4 mg via INTRAVENOUS
  Filled 2016-05-16: qty 1

## 2016-05-16 MED ORDER — KETOROLAC TROMETHAMINE 30 MG/ML IJ SOLN
30.0000 mg | Freq: Once | INTRAMUSCULAR | Status: AC
  Administered 2016-05-16: 30 mg via INTRAVENOUS
  Filled 2016-05-16: qty 1

## 2016-05-16 MED ORDER — LORAZEPAM 2 MG/ML IJ SOLN
0.5000 mg | Freq: Once | INTRAMUSCULAR | Status: AC
  Administered 2016-05-16: 0.5 mg via INTRAVENOUS
  Filled 2016-05-16: qty 1

## 2016-05-16 MED ORDER — IPRATROPIUM BROMIDE 0.02 % IN SOLN
0.5000 mg | Freq: Once | RESPIRATORY_TRACT | Status: AC
  Administered 2016-05-16: 0.5 mg via RESPIRATORY_TRACT

## 2016-05-16 MED ORDER — POTASSIUM CHLORIDE CRYS ER 20 MEQ PO TBCR
20.0000 meq | EXTENDED_RELEASE_TABLET | Freq: Once | ORAL | Status: DC
  Filled 2016-05-16: qty 1

## 2016-05-16 MED ORDER — GUAIFENESIN ER 600 MG PO TB12
600.0000 mg | ORAL_TABLET | Freq: Two times a day (BID) | ORAL | Status: DC
  Administered 2016-05-16 – 2016-05-20 (×9): 600 mg via ORAL
  Filled 2016-05-16 (×10): qty 1

## 2016-05-16 MED ORDER — MORPHINE SULFATE (PF) 2 MG/ML IV SOLN
INTRAVENOUS | Status: AC
  Administered 2016-05-16: 2 mg via INTRAVENOUS
  Filled 2016-05-16: qty 1

## 2016-05-16 MED ORDER — INSULIN ASPART 100 UNIT/ML ~~LOC~~ SOLN
0.0000 [IU] | Freq: Three times a day (TID) | SUBCUTANEOUS | Status: DC
  Administered 2016-05-16: 3 [IU] via SUBCUTANEOUS
  Administered 2016-05-16: 5 [IU] via SUBCUTANEOUS
  Administered 2016-05-16: 8 [IU] via SUBCUTANEOUS
  Administered 2016-05-17 – 2016-05-18 (×4): 3 [IU] via SUBCUTANEOUS
  Administered 2016-05-18: 2 [IU] via SUBCUTANEOUS
  Administered 2016-05-18: 8 [IU] via SUBCUTANEOUS
  Administered 2016-05-19 (×2): 3 [IU] via SUBCUTANEOUS
  Administered 2016-05-19: 5 [IU] via SUBCUTANEOUS
  Administered 2016-05-20: 2 [IU] via SUBCUTANEOUS

## 2016-05-16 MED ORDER — ALBUTEROL SULFATE (2.5 MG/3ML) 0.083% IN NEBU: 2.5000 mg | INHALATION_SOLUTION | Freq: Four times a day (QID) | RESPIRATORY_TRACT | Status: DC

## 2016-05-16 MED ORDER — APIXABAN 5 MG PO TABS
5.0000 mg | ORAL_TABLET | Freq: Two times a day (BID) | ORAL | Status: DC
  Administered 2016-05-16 – 2016-05-20 (×9): 5 mg via ORAL
  Filled 2016-05-16 (×10): qty 1

## 2016-05-16 MED ORDER — ALBUTEROL SULFATE (2.5 MG/3ML) 0.083% IN NEBU: 2.5000 mg | INHALATION_SOLUTION | RESPIRATORY_TRACT | Status: DC | PRN

## 2016-05-16 MED ORDER — ALBUTEROL (5 MG/ML) CONTINUOUS INHALATION SOLN
5.0000 mg/h | INHALATION_SOLUTION | RESPIRATORY_TRACT | Status: DC
  Administered 2016-05-16: 5 mg/h via RESPIRATORY_TRACT

## 2016-05-16 MED ORDER — IPRATROPIUM BROMIDE 0.02 % IN SOLN: 0.5000 mg | Freq: Four times a day (QID) | RESPIRATORY_TRACT | Status: DC

## 2016-05-16 MED ORDER — IPRATROPIUM-ALBUTEROL 0.5-2.5 (3) MG/3ML IN SOLN
3.0000 mL | RESPIRATORY_TRACT | Status: DC
Start: 2016-05-16 — End: 2016-05-19
  Administered 2016-05-16 – 2016-05-18 (×16): 3 mL via RESPIRATORY_TRACT
  Filled 2016-05-16 (×16): qty 3

## 2016-05-16 MED ORDER — IPRATROPIUM BROMIDE 0.02 % IN SOLN
RESPIRATORY_TRACT | Status: AC
Start: 2016-05-16 — End: 2016-05-16
  Administered 2016-05-16: 0.5 mg via RESPIRATORY_TRACT
  Filled 2016-05-16: qty 2.5

## 2016-05-16 MED ORDER — METHYLPREDNISOLONE SODIUM SUCC 125 MG IJ SOLR
125.0000 mg | Freq: Once | INTRAMUSCULAR | Status: AC
  Administered 2016-05-16: 125 mg via INTRAVENOUS
  Filled 2016-05-16: qty 2

## 2016-05-16 MED ORDER — HYDROCODONE-ACETAMINOPHEN 5-325 MG PO TABS
1.0000 | ORAL_TABLET | ORAL | Status: DC | PRN
  Administered 2016-05-16 – 2016-05-19 (×10): 1 via ORAL
  Filled 2016-05-16 (×10): qty 1

## 2016-05-16 MED ORDER — METHYLPREDNISOLONE SODIUM SUCC 125 MG IJ SOLR
80.0000 mg | Freq: Four times a day (QID) | INTRAMUSCULAR | Status: DC
  Administered 2016-05-16 – 2016-05-20 (×16): 80 mg via INTRAVENOUS
  Filled 2016-05-16: qty 1.28
  Filled 2016-05-16 (×6): qty 2
  Filled 2016-05-16: qty 1.28
  Filled 2016-05-16 (×8): qty 2

## 2016-05-16 MED ORDER — MAGNESIUM SULFATE 4 GM/100ML IV SOLN
4.0000 g | Freq: Once | INTRAVENOUS | Status: AC
Start: 2016-05-16 — End: 2016-05-16
  Administered 2016-05-16: 4 g via INTRAVENOUS
  Filled 2016-05-16: qty 100

## 2016-05-16 NOTE — Progress Notes (Signed)
ANTICOAGULATION CONSULT NOTE - Initial Consult  Pharmacy Consult for apixaban Indication: History of PE  Allergies  Allergen Reactions  . No Known Allergies     Patient Measurements: Height: 6\' 2"  (188 cm) Weight: 194 lb (88 kg) IBW/kg (Calculated) : 82.2 Heparin Dosing Weight: 88 kg  Vital Signs: Temp: 97.7 F (36.5 C) (02/20 0727) Temp Source: Oral (02/20 0727) BP: 155/107 (02/20 0800) Pulse Rate: 91 (02/20 0800)  Labs:  Recent Labs  05/15/16 2133 05/16/16 0322  HGB 11.5*  --   HCT 33.9*  --   PLT 152  --   CREATININE 0.66  --   TROPONINI  --  <0.03    Estimated Creatinine Clearance: 121.3 mL/min (by C-G formula based on SCr of 0.66 mg/dL).   Medical History: Past Medical History:  Diagnosis Date  . Asthma   . CAD (coronary artery disease)    a. 09/2013 NSTEMI/PCI: LM nl, LAD 40-50%, D1 100 (2.25 x 28 Vision BMS), LCX min irregs, RI 60-70, 30, RCA 40-50/50-26ms/p, EF 45-50%.  b. cath 12/2014 -occulded BMS in diag, 50% pro to mid LAD, 60% ramus, 30% RCA   . Chest pain 08/2015  . Chronic diastolic CHF (congestive heart failure) ([place])    a. 09/2014 EF 45-50% by LV gram;  b. 01/2014 Echo: EF 55-60%, Gr 1 DD.  Marland Kitchen Cocaine abuse   . COPD (chronic obstructive pulmonary disease) ([place])   . DVT (deep venous thrombosis) ([place])    "I had ~ 10 in each leg"  . Essential hypertension   . Hepatitis C    "clear free over a year now"  . History of pneumonia   . Osteoarthritis    a. hands and toes.  . Pulmonary embolism ([place])    a. 2012 - s/p IVC filter;  b. prev on eliquis - noncompliant.  . Shortness of breath dyspnea   . Sinus headache   . Tobacco abuse     Medications:  Prescriptions Prior to Admission  Medication Sig Dispense Refill Last Dose  . albuterol (PROVENTIL) (5 MG/ML) 0.5% nebulizer solution Take 2.5 mg by nebulization every 6 (six) hours as needed for wheezing or shortness of breath.   05/15/2016 at Unknown time  . atorvastatin (LIPITOR) 40 MG tablet Take 1  tablet (40 mg total) by mouth daily. 30 tablet 3 05/15/2016 at Unknown time  . doxycycline (VIBRAMYCIN) 100 MG capsule Take 100 mg by mouth daily. Take one capsule by mouth daily with food   05/15/2016 at Unknown time  . ELIQUIS 2.5 MG TABS tablet Take 2 tablets (5 mg total) by mouth 2 (two) times daily. 60 tablet 0 05/15/2016 at Unknown time  . Fluticasone-Salmeterol (ADVAIR DISKUS) 250-50 MCG/DOSE AEPB Inhale 1 puff into the lungs 2 (two) times daily. 1 each 3 05/15/2016 at Unknown time  . HYDROcodone-acetaminophen (NORCO/VICODIN) 5-325 MG tablet Take 1-2 tablets by mouth every 6 (six) hours as needed for moderate pain. 30 tablet 0 05/15/2016 at Unknown time  . VENTOLIN HFA 108 (90 Base) MCG/ACT inhaler Inhale 2 puffs into the lungs every 6 (six) hours as needed for wheezing or shortness of breath. 1 Inhaler 2 05/15/2016 at Unknown time    Assessment: 56 yo M admitted 05/15/2016 on apixaban PTA for history of PE. Pharmacy consulted to resume apixaban, last dose was 2/19.  Hgb 11.5, Plt 152, no signs/symptoms of bleeding noted   Goal of Therapy:  Monitor platelets by anticoagulation protocol: Yes   Plan:  - Resume home apixaban 5  mg BID - Monitor CBC, renal function and signs/symptoms of bleeding  Dimitri Ped, PharmD, BCPS PGY-2 Infectious Diseases Pharmacy Resident Pager: [phone] 05/16/2016,9:14 AM

## 2016-05-16 NOTE — Progress Notes (Signed)
Patient seen and examined   [name] is a 56 y.o. male with medical history significant of asthma, CAD, chest pain, chronic diastolic CHF, cocaine abuse, COPD, DVT, pulmonary embolism, tobacco abuse, essential hypertension, hepatitis C, osteoarthritis who is coming to the emergency department with progressively  shortness of breath for the past 5 days associated with productive cough of yellowish sputum. Initially found to have acute COPD exacerbation. Placed on BiPAP and transferred to stepdown.  Assessment and plan   acute COPD exacerbation [facility]) Patient has improved with supportive care. Continue supplemental oxygen. Continue BiPAP as needed Continue scheduled and as needed bronchodilators. Continue Solu-Medrol. Supplement electrolytes to avoid bronchodilator-induced tachycardia.      History of pulmonary embolism-   History of DVT (deep vein thrombosis) Continue Eliquis.   Hyperglycemia Started on sliding scale insulin will check hemoglobin A1c Likely driven by high-dose steroids     Tobacco abuse Nicotine replacement was declined by the patient.      Anemia Monitor hematocrit and hemoglobin.    CAD (coronary artery disease) Continue atorvastatin. Will start aspirin 81 mg by mouth daily.    HTN (hypertension) Not on antihypertensives at this time Monitor blood pressure closely.  Chronic pain syndrome Patient is chronically on narcotics Avoid IV narcotics in the hospital

## 2016-05-16 NOTE — Progress Notes (Signed)
Pt removed from bipap at this time due to inability to tolerate.  Pt was diaphoretic and anxious when RT came to visit pt.  Rt will monitor.

## 2016-05-16 NOTE — H&P (Signed)
History and Physical    [name] [id number] DOB: 1960-10-14 DOA: 05/15/2016  PCP: Arnoldo Morale, MD   Patient coming from: Home.  Chief Complaint: Shortness of breath.  HPI: [name] is a 56 y.o. male with medical history significant of asthma, CAD, chest pain, chronic diastolic CHF, cocaine abuse, COPD, DVT, pulmonary embolism, tobacco abuse, essential hypertension, hepatitis C, osteoarthritis who is coming to the emergency department with progressively worse shortness of breath for the past 5 days associated with productive cough of yellowish sputum, wheezing and fatigue despite home use of bronchodilators. He denies fever, chills, rhinorrhea, sore throat, but complains of pleuritic chest pain. He denies nausea, emesis, dizziness, diarrhea, constipation, melena or hematochezia. He denies dysuria or frequency.  ED Course: The patient was given a continuous 10 mg albuterol nebulizer treatment and was started on BiPAP ventilation. His urinalysis was hazy in color, but otherwise within normal limits. EKG was sinus tachycardia with biatrial enlargement. WBC 4.3, hemoglobin 11.5 g/dL and platelets 152. LFTs show an AST of 50, otherwise within normal limits. Sodium 139, potassium 3.4, chloride 104 and CO2 21 mmol/L. BUN was 5, creatinine 0.6 and glucose 109 mg/dL. Chest radiograph did not show any acute pathology.   Review of Systems: As per HPI otherwise 10 point review of systems negative.    Past Medical History:  Diagnosis Date  . Asthma   . CAD (coronary artery disease)    a. 09/2013 NSTEMI/PCI: LM nl, LAD 40-50%, D1 100 (2.25 x 28 Vision BMS), LCX min irregs, RI 60-70, 30, RCA 40-50/50-69ms/p, EF 45-50%.  b. cath 12/2014 -occulded BMS in diag, 50% pro to mid LAD, 60% ramus, 30% RCA   . Chest pain 08/2015  . Chronic diastolic CHF (congestive heart failure) ([place])    a. 09/2014 EF 45-50% by LV gram;  b. 01/2014 Echo: EF 55-60%, Gr 1 DD.  Marland Kitchen Cocaine abuse   . COPD (chronic  obstructive pulmonary disease) ([place])   . DVT (deep venous thrombosis) ([place])    "I had ~ 10 in each leg"  . Essential hypertension   . Hepatitis C    "clear free over a year now"  . History of pneumonia   . Osteoarthritis    a. hands and toes.  . Pulmonary embolism ([place])    a. 2012 - s/p IVC filter;  b. prev on eliquis - noncompliant.  . Shortness of breath dyspnea   . Sinus headache   . Tobacco abuse     Past Surgical History:  Procedure Laterality Date  . CARDIAC CATHETERIZATION N/A 01/04/2015   Procedure: Left Heart Cath and Coronary Angiography;  Surgeon: Burnell Blanks, MD;  Location: [facility];  Service: Cardiovascular;  Laterality: N/A;  . CORONARY ANGIOPLASTY WITH STENT PLACEMENT  10/21/13   BMS to D1  . CYST EXCISION N/A 05/10/2016   Procedure: EXCISION OF SEBACEOUS CYST UPPER BACK;  Surgeon: Donnie Mesa, MD;  Location: [facility];  Service: General;  Laterality: N/A;  . LEFT HEART CATHETERIZATION WITH CORONARY ANGIOGRAM N/A 10/21/2013   Procedure: LEFT HEART CATHETERIZATION WITH CORONARY ANGIOGRAM;  Surgeon: Leonie Man, MD;  [facility];  Service: Cardiovascular;  Laterality: N/A;  . VENA CAVA FILTER PLACEMENT  " ~ 2012     reports that he has been smoking Cigarettes.  He has a 9.50 pack-year smoking history. He has never used smokeless tobacco. He reports that he drinks about 4.2 oz of alcohol per week . He reports that he  does not use drugs.  Allergies  Allergen Reactions  . No Known Allergies     Family History  Problem Relation Age of Onset  . Asthma Mother   . Allergies Mother   . Allergies Sister   . Allergies Brother   . Deep vein thrombosis Brother     two brothers with recurrent DVT  . Heart attack Father     a.60s b. deceased in his 17s  . Coronary artery disease Father     Prior to Admission medications   Medication Sig Start Date End Date Taking? Authorizing Provider  albuterol (PROVENTIL) (5 MG/ML) 0.5% nebulizer  solution Take 2.5 mg by nebulization every 6 (six) hours as needed for wheezing or shortness of breath.   Yes Historical Provider, MD  atorvastatin (LIPITOR) 40 MG tablet Take 1 tablet (40 mg total) by mouth daily. 03/28/16  Yes Arnoldo Morale, MD  doxycycline (VIBRAMYCIN) 100 MG capsule Take 100 mg by mouth daily. Take one capsule by mouth daily with food   Yes Historical Provider, MD  ELIQUIS 2.5 MG TABS tablet Take 2 tablets (5 mg total) by mouth 2 (two) times daily. 03/28/16  Yes Arnoldo Morale, MD  Fluticasone-Salmeterol (ADVAIR DISKUS) 250-50 MCG/DOSE AEPB Inhale 1 puff into the lungs 2 (two) times daily. 03/28/16  Yes Arnoldo Morale, MD  HYDROcodone-acetaminophen (NORCO/VICODIN) 5-325 MG tablet Take 1-2 tablets by mouth every 6 (six) hours as needed for moderate pain. 05/10/16  Yes Donnie Mesa, MD  VENTOLIN HFA 108 (90 Base) MCG/ACT inhaler Inhale 2 puffs into the lungs every 6 (six) hours as needed for wheezing or shortness of breath. 05/12/16  Yes Arnoldo Morale, MD    Physical Exam:  Constitutional: NAD, calm, comfortable Vitals:   05/15/16 2114 05/15/16 2124 05/15/16 2325  BP: 138/93    Pulse: 120    Resp: 22    Temp: 98.6 F (37 C)    TempSrc: Oral    SpO2: 97%  99%  Weight:  88 kg (194 lb)   Height:  6\' 2"  (1.88 m)    Eyes: PERRL, lids and conjunctivae normal ENMT: Mucous membranes are moist when re-examined later. Posterior pharynx clear of any exudate or lesions on re-examination off BiPAP. Neck: normal, supple, no masses, no thyromegaly Respiratory: Decreased breath sounds bilaterally with wheezing and rhonchi.. Tachypneic at 28 BPM while on BiPAP when evaluated, no accessory muscle use.  Cardiovascular: Tachycardic at 107 BPM, no murmurs / rubs / gallops. No extremity edema. 2+ pedal pulses. No carotid bruits.  Abdomen: Soft, no tenderness, no masses palpated. No hepatosplenomegaly. Bowel sounds positive.  Musculoskeletal: no clubbing / cyanosis. Good ROM, no contractures. Normal  muscle tone.  Skin: Positive pustular acne lesions in multiple stages of healing on upper back. Neurologic: CN 2-12 grossly intact. Sensation intact, DTR normal. Strength 5/5 in all 4.  Psychiatric: Alert and oriented x 3. anxious mood.    Labs on Admission: I have personally reviewed following labs and imaging studies  CBC:  Recent Labs Lab 05/09/16 0834 05/15/16 2133  WBC 4.6 4.3  NEUTROABS  --  1.6*  HGB 12.0* 11.5*  HCT 37.5* 33.9*  MCV 93.5 90.6  PLT 139* 0000000   Basic Metabolic Panel:  Recent Labs Lab 05/09/16 0834 05/15/16 2133  NA 142 139  K 3.7 3.4*  CL 112* 104  CO2 21* 21*  GLUCOSE 92 109*  BUN 10 5*  CREATININE 0.75 0.66  CALCIUM 9.3 9.4   GFR: Estimated Creatinine Clearance: 121.3 mL/min (by  C-G formula based on SCr of 0.66 mg/dL). Liver Function Tests:  Recent Labs Lab 05/15/16 2133  AST 50*  ALT 34  ALKPHOS 74  BILITOT 1.0  PROT 6.9  ALBUMIN 3.7   No results for input(s): LIPASE, AMYLASE in the last 168 hours. No results for input(s): AMMONIA in the last 168 hours. Coagulation Profile:  Recent Labs Lab 05/10/16 1030  INR 0.96   Cardiac Enzymes: No results for input(s): CKTOTAL, CKMB, CKMBINDEX, TROPONINI in the last 168 hours. BNP (last 3 results) No results for input(s): PROBNP in the last 8760 hours. HbA1C: No results for input(s): HGBA1C in the last 72 hours. CBG: No results for input(s): GLUCAP in the last 168 hours. Lipid Profile: No results for input(s): CHOL, HDL, LDLCALC, TRIG, CHOLHDL, LDLDIRECT in the last 72 hours. Thyroid Function Tests: No results for input(s): TSH, T4TOTAL, FREET4, T3FREE, THYROIDAB in the last 72 hours. Anemia Panel: No results for input(s): VITAMINB12, FOLATE, FERRITIN, TIBC, IRON, RETICCTPCT in the last 72 hours. Urine analysis:    Component Value Date/Time   COLORURINE YELLOW 09/08/2015 0411   APPEARANCEUR HAZY (A) 09/08/2015 0411   LABSPEC 1.011 09/08/2015 0411   PHURINE 5.5 09/08/2015 0411    GLUCOSEU NEGATIVE 09/08/2015 0411   HGBUR NEGATIVE 09/08/2015 0411   BILIRUBINUR NEGATIVE 09/08/2015 0411   KETONESUR NEGATIVE 09/08/2015 0411   PROTEINUR NEGATIVE 09/08/2015 0411   UROBILINOGEN 0.2 03/28/2012 0118   NITRITE NEGATIVE 09/08/2015 0411   LEUKOCYTESUR NEGATIVE 09/08/2015 0411    Radiological Exams on Admission: Dg Chest 2 View  Result Date: 05/15/2016 CLINICAL DATA:  Left chest pain, shortness of breath, productive cough and chills for 5 days. EXAM: CHEST  2 VIEW COMPARISON:  PA and lateral chest 01/07/2016. FINDINGS: The lungs are clear. Heart size is normal. No pneumothorax or pleural effusion. Aortic atherosclerosis noted. IMPRESSION: No acute disease. Electronically Signed   By: Inge Rise M.D.   On: 05/15/2016 21:43  01/02/2015 Echocardiogram ------------------------------------------------------------------- LV EF: 55% -   60%  ------------------------------------------------------------------- Indications:      Chest pain 786.51.  ------------------------------------------------------------------- History:   PMH:   Coronary artery disease.  Congestive heart failure.  Chronic obstructive pulmonary disease.  Risk factors: Cocaine Abuse. Current tobacco use. Hypertension.  ------------------------------------------------------------------- Study Conclusions  - Left ventricle: The cavity size was normal. There was mild   concentric hypertrophy. Systolic function was normal. The   estimated ejection fraction was in the range of 55% to 60%. Wall   motion was normal; there were no regional wall motion   abnormalities. Doppler parameters are consistent with abnormal   left ventricular relaxation (grade 1 diastolic dysfunction).   There was no evidence of elevated ventricular filling pressure by   Doppler parameters. - Aortic valve: Trileaflet; normal thickness leaflets. There was no   regurgitation. - Aortic root: The aortic root was normal in size. -  Mitral valve: Structurally normal valve. There was no   regurgitation. - Right ventricle: The cavity size was normal. Wall thickness was   normal. Systolic function was normal. - Right atrium: The atrium was normal in size. - Tricuspid valve: There was trivial regurgitation. - Pulmonic valve: There was no regurgitation. - Pulmonary arteries: Systolic pressure was within the normal   range. - Inferior vena cava: The vessel was normal in size. - Pericardium, extracardiac: There was no pericardial effusion   EKG: Independently reviewed.  Vent. rate 110 BPM PR interval 182 ms QRS duration 82 ms QT/QTc 344/465 ms P-R-T axes 82 54  74 Sinus tachycardia Biatrial enlargement Abnormal ECG Since last tracing rate faster  Assessment/Plan Principal Problem:   COPD exacerbation (HCC) Admit to stepdown/observation. Continue supplemental oxygen. Continue BiPAP ventilation as needed. Continue scheduled and as needed bronchodilators. Continue Solu-Medrol. Supplement electrolytes to avoid bronchodilator-induced tachycardia.  Active Problems:   History of pulmonary embolism   History of DVT (deep vein thrombosis) Continue Eliquis.    Tobacco abuse Nicotine replacement was declined by the patient.      Anemia Monitor hematocrit and hemoglobin.    CAD (coronary artery disease) Continue atorvastatin. Will start aspirin 81 mg by mouth daily.    HTN (hypertension) Not on antihypertensives at this time Monitor blood pressure closely.     DVT prophylaxis: On Eliquis. Code Status: Full code. Family Communication: His son was present in the emergency department. Disposition Plan: Admit for BiPAP ventilation, COPD treatment. Consults called:  Admission status: SDU/Observation.   Reubin Milan MD [facility] Pager [phone].  If 7PM-7AM, please contact night-coverage [email or web address] Password [facility]  05/16/2016, 12:17 AM

## 2016-05-16 NOTE — Progress Notes (Signed)
Nutrition Brief Note  Consult received from COPD protocol.  Spoke with pt who reports good appetite and no recent weight changes PTA. Of note pt met criteria for severe malnutrition in 2015 due to poor access to food as he was living in a garage and had no way to cook food. Pt reports that he has since regained his weight from 166 lb to 195 lb. He has moved and is able to cook now. Pt likes [employer] instant breakfast and will order off menu as needed.   Wt Readings from Last 15 Encounters:  05/15/16 194 lb (88 kg)  05/10/16 194 lb 6.4 oz (88.2 kg)  05/09/16 194 lb 6.4 oz (88.2 kg)  04/25/16 193 lb 3.2 oz (87.6 kg)  03/28/16 193 lb (87.5 kg)  03/28/16 194 lb 12.8 oz (88.4 kg)  02/22/16 187 lb 9.6 oz (85.1 kg)  02/07/16 191 lb 12.8 oz (87 kg)  02/01/16 180 lb 8 oz (81.9 kg)  01/07/16 182 lb (82.6 kg)  11/06/15 169 lb 15.6 oz (77.1 kg)  09/09/15 172 lb 12.8 oz (78.4 kg)  06/04/15 160 lb 9.6 oz (72.8 kg)  04/08/15 171 lb (77.6 kg)  02/24/15 172 lb (78 kg)    Body mass index is 24.91 kg/m. Patient meets criteria for normal based on current BMI.   Current diet order is Heart Health, patient is consuming approximately 100% of meals at this time. Labs and medications reviewed.   No nutrition interventions warranted at this time. If nutrition issues arise, please consult RD.   [place], [place], [place] Pager [phone] After Hours Pager

## 2016-05-17 ENCOUNTER — Inpatient Hospital Stay (HOSPITAL_COMMUNITY): Payer: Medicare Other

## 2016-05-17 DIAGNOSIS — R0600 Dyspnea, unspecified: Secondary | ICD-10-CM

## 2016-05-17 DIAGNOSIS — D500 Iron deficiency anemia secondary to blood loss (chronic): Secondary | ICD-10-CM

## 2016-05-17 LAB — GLUCOSE, CAPILLARY
GLUCOSE-CAPILLARY: 173 mg/dL — AB (ref 65–99)
GLUCOSE-CAPILLARY: 200 mg/dL — AB (ref 65–99)
GLUCOSE-CAPILLARY: 153 mg/dL — AB (ref 65–99)
Glucose-Capillary: 174 mg/dL — ABNORMAL HIGH (ref 65–99)

## 2016-05-17 LAB — ECHOCARDIOGRAM COMPLETE
Height: 74 in
Weight: 3172.8 oz

## 2016-05-17 LAB — HEMOGLOBIN A1C
Hgb A1c MFr Bld: 5.4 % (ref 4.8–5.6)
MEAN PLASMA GLUCOSE: 108

## 2016-05-17 MED ORDER — AZITHROMYCIN 500 MG PO TABS
500.0000 mg | ORAL_TABLET | Freq: Every day | ORAL | Status: DC
  Administered 2016-05-17: 500 mg via ORAL
  Filled 2016-05-17: qty 1

## 2016-05-17 MED ORDER — VENTOLIN HFA 108 (90 BASE) MCG/ACT IN AERS: 2.0000 | INHALATION_SPRAY | Freq: Four times a day (QID) | RESPIRATORY_TRACT | 3 refills | Status: DC | PRN

## 2016-05-17 MED ORDER — PREDNISONE 50 MG PO TABS: 50.0000 mg | ORAL_TABLET | Freq: Every day | ORAL | 0 refills | Status: DC

## 2016-05-17 MED ORDER — DEXTROSE 5 % IV SOLN
1.0000 g | INTRAVENOUS | Status: DC
  Administered 2016-05-17 – 2016-05-19 (×3): 1 g via INTRAVENOUS
  Filled 2016-05-17 (×4): qty 10

## 2016-05-17 MED ORDER — AZITHROMYCIN 500 MG PO TABS: 500.0000 mg | ORAL_TABLET | Freq: Every day | ORAL | 0 refills | Status: AC

## 2016-05-17 MED ORDER — MAGNESIUM OXIDE 400 (241.3 MG) MG PO TABS: 400.0000 mg | ORAL_TABLET | Freq: Every day | ORAL | 0 refills | Status: DC

## 2016-05-17 MED ORDER — MAGNESIUM OXIDE 400 (241.3 MG) MG PO TABS
400.0000 mg | ORAL_TABLET | Freq: Every day | ORAL | Status: DC
  Administered 2016-05-17 – 2016-05-20 (×4): 400 mg via ORAL
  Filled 2016-05-17 (×4): qty 1

## 2016-05-17 MED ORDER — GUAIFENESIN ER 600 MG PO TB12: 600.0000 mg | ORAL_TABLET | Freq: Two times a day (BID) | ORAL | 0 refills | Status: AC

## 2016-05-17 MED ORDER — MOMETASONE FURO-FORMOTEROL FUM 200-5 MCG/ACT IN AERO
2.0000 | INHALATION_SPRAY | Freq: Two times a day (BID) | RESPIRATORY_TRACT | Status: DC
  Administered 2016-05-17 – 2016-05-20 (×7): 2 via RESPIRATORY_TRACT
  Filled 2016-05-17: qty 8.8

## 2016-05-17 MED ORDER — PERFLUTREN LIPID MICROSPHERE
1.0000 mL | INTRAVENOUS | Status: AC | PRN
  Administered 2016-05-17: 2 mL via INTRAVENOUS
  Filled 2016-05-17: qty 10

## 2016-05-17 NOTE — Progress Notes (Signed)
  Echocardiogram 2D Echocardiogram with Definity has been performed.  Diamond Nickel 05/17/2016, 10:43 AM

## 2016-05-17 NOTE — Discharge Instructions (Signed)

## 2016-05-17 NOTE — Progress Notes (Signed)
Patient O2 sats are 97% ambulating on room air.

## 2016-05-17 NOTE — Discharge Summary (Addendum)
Physician Discharge Summary  [name] MRN: [id number] DOB/AGE: 1960-12-26 56 y.o.  PCP: Arnoldo Morale, MD   Admit date: 05/15/2016 Discharge date: 05/17/2016  Discharge Diagnoses:    Principal Problem:   COPD exacerbation ([place]) Active Problems:   History of pulmonary embolism   Tobacco abuse   History of DVT (deep vein thrombosis)   Anemia   CAD (coronary artery disease)   HTN (hypertension)    Follow-up recommendations Follow-up with PCP in 3-5 days , including all  additional recommended appointments as below Follow-up CBC, CMP in 3-5 days  ADDENDUM Subjectively SOB, so will hold DC , added Dulera      Current Discharge Medication List    START taking these medications   Details  azithromycin (ZITHROMAX) 500 MG tablet Take 1 tablet (500 mg total) by mouth daily. Qty: 5 tablet, Refills: 0    guaiFENesin (MUCINEX) 600 MG 12 hr tablet Take 1 tablet (600 mg total) by mouth 2 (two) times daily. Qty: 20 tablet, Refills: 0    magnesium oxide (MAG-OX) 400 (241.3 Mg) MG tablet Take 1 tablet (400 mg total) by mouth daily. Qty: 30 tablet, Refills: 0    predniSONE (DELTASONE) 50 MG tablet Take 1 tablet (50 mg total) by mouth daily with breakfast. Qty: 5 tablet, Refills: 0      CONTINUE these medications which have CHANGED   Details  VENTOLIN HFA 108 (90 Base) MCG/ACT inhaler Inhale 2 puffs into the lungs every 6 (six) hours as needed for wheezing or shortness of breath. Qty: 1 Inhaler, Refills: 3      CONTINUE these medications which have NOT CHANGED   Details  albuterol (PROVENTIL) (5 MG/ML) 0.5% nebulizer solution Take 2.5 mg by nebulization every 6 (six) hours as needed for wheezing or shortness of breath.    atorvastatin (LIPITOR) 40 MG tablet Take 1 tablet (40 mg total) by mouth daily. Qty: 30 tablet, Refills: 3    ELIQUIS 2.5 MG TABS tablet Take 2 tablets (5 mg total) by mouth 2 (two) times daily. Qty: 60 tablet, Refills: 0    Fluticasone-Salmeterol  (ADVAIR DISKUS) 250-50 MCG/DOSE AEPB Inhale 1 puff into the lungs 2 (two) times daily. Qty: 1 each, Refills: 3   Associated Diagnoses: Other emphysema (HCC)    HYDROcodone-acetaminophen (NORCO/VICODIN) 5-325 MG tablet Take 1-2 tablets by mouth every 6 (six) hours as needed for moderate pain. Qty: 30 tablet, Refills: 0      STOP taking these medications     doxycycline (VIBRAMYCIN) 100 MG capsule          Discharge Condition: Stable   Discharge Instructions Get Medicines reviewed and adjusted: Please take all your medications with you for your next visit with your Primary MD  Please request your Primary MD to go over all hospital tests and procedure/radiological results at the follow up, please ask your Primary MD to get all Hospital records sent to his/her office.  If you experience worsening of your admission symptoms, develop shortness of breath, life threatening emergency, suicidal or homicidal thoughts you must seek medical attention immediately by calling 911 or calling your MD immediately if symptoms less severe.  You must read complete instructions/literature along with all the possible adverse reactions/side effects for all the Medicines you take and that have been prescribed to you. Take any new Medicines after you have completely understood and accpet all the possible adverse reactions/side effects.   Do not drive when taking Pain medications.   Do not take more than prescribed  Pain, Sleep and Anxiety Medications  Special Instructions: If you have smoked or chewed Tobacco in the last 2 yrs please stop smoking, stop any regular Alcohol and or any Recreational drug use.  Wear Seat belts while driving.  Please note  You were cared for by a hospitalist during your hospital stay. Once you are discharged, your primary care physician will handle any further medical issues. Please note that NO REFILLS for any discharge medications will be authorized once you are  discharged, as it is imperative that you return to your primary care physician (or establish a relationship with a primary care physician if you do not have one) for your aftercare needs so that they can reassess your need for medications and monitor your lab values.     Allergies  Allergen Reactions  . No Known Allergies       Disposition: 01-Home or Self Care   Consults:  None     Significant Diagnostic Studies:  Dg Chest 2 View  Result Date: 05/15/2016 CLINICAL DATA:  Left chest pain, shortness of breath, productive cough and chills for 5 days. EXAM: CHEST  2 VIEW COMPARISON:  PA and lateral chest 01/07/2016. FINDINGS: The lungs are clear. Heart size is normal. No pneumothorax or pleural effusion. Aortic atherosclerosis noted. IMPRESSION: No acute disease. Electronically Signed   By: Inge Rise M.D.   On: 05/15/2016 21:43      Filed Weights   05/15/16 2124 05/16/16 1747  Weight: 88 kg (194 lb) 89.9 kg (198 lb 4.8 oz)     Microbiology: Recent Results (from the past 240 hour(s))  MRSA PCR Screening     Status: None   Collection Time: 05/16/16  5:26 AM  Result Value Ref Range Status   MRSA by PCR NEGATIVE NEGATIVE Final    Comment:        The GeneXpert MRSA Assay (FDA approved for NASAL specimens only), is one component of a comprehensive MRSA colonization surveillance program. It is not intended to diagnose MRSA infection nor to guide or monitor treatment for MRSA infections.        Blood Culture    Component Value Date/Time   SDES BLOOD LEFT HAND 11/06/2015 0407   SPECREQUEST BOTTLES DRAWN AEROBIC ONLY 5CC 11/06/2015 0407   CULT  11/06/2015 0407    NO GROWTH 5 DAYS Performed at [facility] 11/11/2015 FINAL 11/06/2015 0407      Labs: Results for orders placed or performed during the hospital encounter of 05/15/16 (from the past 48 hour(s))  CBC with Differential     Status: Abnormal   Collection Time: 05/15/16  9:33  PM  Result Value Ref Range   WBC 4.3 4.0 - 10.5 K/uL   RBC 3.74 (L) 4.22 - 5.81 MIL/uL   Hemoglobin 11.5 (L) 13.0 - 17.0 g/dL   HCT 33.9 (L) 39.0 - 52.0 %   MCV 90.6 78.0 - 100.0 fL   MCH 30.7 26.0 - 34.0 pg   MCHC 33.9 30.0 - 36.0 g/dL   RDW 13.0 11.5 - 15.5 %   Platelets 152 150 - 400 K/uL   Neutrophils Relative % 38 %   Neutro Abs 1.6 (L) 1.7 - 7.7 K/uL   Lymphocytes Relative 36 %   Lymphs Abs 1.6 0.7 - 4.0 K/uL   Monocytes Relative 12 %   Monocytes Absolute 0.5 0.1 - 1.0 K/uL   Eosinophils Relative 14 %   Eosinophils Absolute 0.6 0.0 - 0.7 K/uL  Basophils Relative 0 %   Basophils Absolute 0.0 0.0 - 0.1 K/uL  Comprehensive metabolic panel     Status: Abnormal   Collection Time: 05/15/16  9:33 PM  Result Value Ref Range   Sodium 139 135 - 145 mmol/L   Potassium 3.4 (L) 3.5 - 5.1 mmol/L   Chloride 104 101 - 111 mmol/L   CO2 21 (L) 22 - 32 mmol/L   Glucose, Bld 109 (H) 65 - 99 mg/dL   BUN 5 (L) 6 - 20 mg/dL   Creatinine, Ser 0.66 0.61 - 1.24 mg/dL   Calcium 9.4 8.9 - 10.3 mg/dL   Total Protein 6.9 6.5 - 8.1 g/dL   Albumin 3.7 3.5 - 5.0 g/dL   AST 50 (H) 15 - 41 U/L   ALT 34 17 - 63 U/L   Alkaline Phosphatase 74 38 - 126 U/L   Total Bilirubin 1.0 0.3 - 1.2 mg/dL   GFR calc non Af Amer >60 >60 mL/min   GFR calc Af Amer >60 >60 mL/min    Comment: (NOTE) The eGFR has been calculated using the CKD EPI equation. This calculation has not been validated in all clinical situations. eGFR's persistently <60 mL/min signify possible Chronic Kidney Disease.    Anion gap 14 5 - 15  Magnesium     Status: None   Collection Time: 05/15/16  9:33 PM  Result Value Ref Range   Magnesium 1.8 1.7 - 2.4 mg/dL  Phosphorus     Status: None   Collection Time: 05/15/16  9:33 PM  Result Value Ref Range   Phosphorus 2.9 2.5 - 4.6 mg/dL  I-Stat Troponin, ED (not at [facility])     Status: None   Collection Time: 05/15/16  9:45 PM  Result Value Ref Range   Troponin i, poc 0.00 0.00 - 0.08 ng/mL    Comment 3            Comment: Due to the release kinetics of cTnI, a negative result within the first hours of the onset of symptoms does not rule out myocardial infarction with certainty. If myocardial infarction is still suspected, repeat the test at appropriate intervals.   I-Stat Arterial Blood Gas, ED - (order at [facility] and [facility] only)     Status: Abnormal   Collection Time: 05/15/16 11:32 PM  Result Value Ref Range   pH, Arterial 7.383 7.350 - 7.450   pCO2 arterial 36.9 32.0 - 48.0 mmHg   pO2, Arterial 68.0 (L) 83.0 - 108.0 mmHg   Bicarbonate 22.0 20.0 - 28.0 mmol/L   TCO2 23 0 - 100 mmol/L   O2 Saturation 93.0 %   Acid-base deficit 3.0 (H) 0.0 - 2.0 mmol/L   Patient temperature 98.6 F    Collection site RADIAL, ALLEN'S TEST ACCEPTABLE    Drawn by RT    Sample type ARTERIAL   Troponin I     Status: None   Collection Time: 05/16/16  3:22 AM  Result Value Ref Range   Troponin I <0.03 <0.03 ng/mL  Brain natriuretic peptide     Status: None   Collection Time: 05/16/16  3:22 AM  Result Value Ref Range   B Natriuretic Peptide 80.4 0.0 - 100.0 pg/mL  Glucose, capillary     Status: Abnormal   Collection Time: 05/16/16  5:25 AM  Result Value Ref Range   Glucose-Capillary 230 (H) 65 - 99 mg/dL   Comment 1 Notify RN   MRSA PCR Screening     Status: None  Collection Time: 05/16/16  5:26 AM  Result Value Ref Range   MRSA by PCR NEGATIVE NEGATIVE    Comment:        The GeneXpert MRSA Assay (FDA approved for NASAL specimens only), is one component of a comprehensive MRSA colonization surveillance program. It is not intended to diagnose MRSA infection nor to guide or monitor treatment for MRSA infections.   Glucose, capillary     Status: Abnormal   Collection Time: 05/16/16  7:26 AM  Result Value Ref Range   Glucose-Capillary 268 (H) 65 - 99 mg/dL   Comment 1 Notify RN    Comment 2 Document in Chart   CBC     Status: Abnormal   Collection Time: 05/16/16  9:08 AM  Result  Value Ref Range   WBC 3.8 (L) 4.0 - 10.5 K/uL   RBC 3.51 (L) 4.22 - 5.81 MIL/uL   Hemoglobin 10.7 (L) 13.0 - 17.0 g/dL   HCT 32.4 (L) 39.0 - 52.0 %   MCV 92.3 78.0 - 100.0 fL   MCH 30.5 26.0 - 34.0 pg   MCHC 33.0 30.0 - 36.0 g/dL   RDW 13.4 11.5 - 15.5 %   Platelets 117 (L) 150 - 400 K/uL    Comment: PLATELET COUNT CONFIRMED BY SMEAR  Comprehensive metabolic panel     Status: Abnormal   Collection Time: 05/16/16  9:08 AM  Result Value Ref Range   Sodium 139 135 - 145 mmol/L   Potassium 3.8 3.5 - 5.1 mmol/L   Chloride 107 101 - 111 mmol/L   CO2 20 (L) 22 - 32 mmol/L   Glucose, Bld 198 (H) 65 - 99 mg/dL   BUN 8 6 - 20 mg/dL   Creatinine, Ser 0.77 0.61 - 1.24 mg/dL   Calcium 8.8 (L) 8.9 - 10.3 mg/dL   Total Protein 6.7 6.5 - 8.1 g/dL   Albumin 3.5 3.5 - 5.0 g/dL   AST 54 (H) 15 - 41 U/L   ALT 35 17 - 63 U/L   Alkaline Phosphatase 70 38 - 126 U/L   Total Bilirubin 0.7 0.3 - 1.2 mg/dL   GFR calc non Af Amer >60 >60 mL/min   GFR calc Af Amer >60 >60 mL/min    Comment: (NOTE) The eGFR has been calculated using the CKD EPI equation. This calculation has not been validated in all clinical situations. eGFR's persistently <60 mL/min signify possible Chronic Kidney Disease.    Anion gap 12 5 - 15  Hemoglobin A1c     Status: None   Collection Time: 05/16/16  9:08 AM  Result Value Ref Range   Hgb A1c MFr Bld 5.4 4.8 - 5.6 %    Comment: (NOTE)         Pre-diabetes: 5.7 - 6.4         Diabetes: >6.4         Glycemic control for adults with diabetes: <7.0    Mean Plasma Glucose 108     Comment: (NOTE) Performed At: [facility] [address] [place], [place] [id number] Lindon Romp MD SL:[id number]   Glucose, capillary     Status: Abnormal   Collection Time: 05/16/16 12:16 PM  Result Value Ref Range   Glucose-Capillary 249 (H) 65 - 99 mg/dL   Comment 1 Notify RN    Comment 2 Document in Chart   Glucose, capillary     Status: Abnormal   Collection Time: 05/16/16   5:04 PM  Result Value Ref Range  Glucose-Capillary 198 (H) 65 - 99 mg/dL   Comment 1 Document in Chart   Glucose, capillary     Status: Abnormal   Collection Time: 05/16/16  9:17 PM  Result Value Ref Range   Glucose-Capillary 223 (H) 65 - 99 mg/dL  Glucose, capillary     Status: Abnormal   Collection Time: 05/17/16  6:06 AM  Result Value Ref Range   Glucose-Capillary 173 (H) 65 - 99 mg/dL   Comment 1 Notify RN      Lipid Panel     Component Value Date/Time   CHOL 190 01/01/2015 0208   TRIG 60 01/01/2015 0208   HDL 62 01/01/2015 0208   CHOLHDL 3.1 01/01/2015 0208   VLDL 12 01/01/2015 0208   LDLCALC 116 (H) 01/01/2015 0208     Lab Results  Component Value Date   HGBA1C 5.4 05/16/2016   HGBA1C 5.6 04/27/2014   HGBA1C 5.6 02/10/2012      HPI :*  [name] is a 56 y.o. male with medical history significant of asthma, CAD, chest pain, chronic diastolic CHF, cocaine abuse, COPD, DVT, pulmonary embolism, tobacco abuse, essential hypertension, hepatitis C, osteoarthritis who is coming to the emergency department with progressively worse shortness of breath for the past 5 days associated with productive cough of yellowish sputum, wheezing and fatigue despite home use of bronchodilators. He denies fever, chills, rhinorrhea, sore throat, but complains of pleuritic chest pain. He denies nausea, emesis, dizziness, diarrhea, constipation, melena or hematochezia. He denies dysuria or frequency.  ED Course: The patient was given a continuous 10 mg albuterol nebulizer treatment and was started on BiPAP ventilation. His urinalysis was hazy in color, but otherwise within normal limits. EKG was sinus tachycardia with biatrial enlargement. WBC 4.3, hemoglobin 11.5 g/dL and platelets 152. LFTs show an AST of 50, otherwise within normal limits. Sodium 139, potassium 3.4, chloride 104 and CO2 21 mmol/L. BUN was 5, creatinine 0.6 and glucose 109 mg/dL. Chest radiograph did not show any acute  pathology.    HOSPITAL COURSE:   acute COPD exacerbation [facility]) Patient has improved with supportive care. No pneumonia on chest x-ray Weaned off oxygen Weaned off BiPAP Treated with scheduled and as needed bronchodilators.,Solu-Medrol.  Also started on azithromycin /rocephin  Also start Dulera  Will continue with prednisone and Mucinex as well    History of pulmonary embolism- History of DVT (deep vein thrombosis) Continue Eliquis.   Hyperglycemia Started on sliding scale insulin will check hemoglobin A1c Likely driven by high-dose steroids   Tobacco abuse Nicotine replacement was declined by the patient.  Anemia Monitor hematocrit and hemoglobin. Baseline hemoglobin is 12.0. No signs of active bleeding. Needs evaluation for his anemia in the outpatient setting  CAD (coronary artery disease) Continue atorvastatin. Will start aspirin 81 mg by mouth daily.  HTN (hypertension) Not on antihypertensives at this time Monitor blood pressure closely.  Chronic pain syndrome Patient is chronically on narcotics Avoid IV narcotics in the hospital   Discharge Exam:   Blood pressure (!) 122/97, pulse 91, temperature 98.2 F (36.8 C), temperature source Oral, resp. rate 20, height 6' 2"  (1.88 m), weight 89.9 kg (198 lb 4.8 oz), SpO2 100 %.  Cardiovascular: Normal rate, regular rhythm and intact distal pulses.   Pulmonary/Chest: Improved Accessory muscle usage present. No stridor. T . No respiratory distress. He has slight wheezes. He has no rales.  Abdominal: Soft. Bowel is  sounds are normal. He exhibits no distension. There is no tenderness. There is no rebound  and no guarding.  Musculoskeletal: He exhibits no edema or tenderness.  Neurological: He is alert. He has normal strength. No cranial nerve deficit     Follow-up Information    Arnoldo Morale, MD. Call.   Specialty:  Family Medicine Why:  To make appointment, hospital follow-up Contact  information: [place] [address] [phone]           Signed: Reyne Dumas 05/17/2016, 7:46 AM        Time spent >45 mins

## 2016-05-18 LAB — GLUCOSE, CAPILLARY
GLUCOSE-CAPILLARY: 233 mg/dL — AB (ref 65–99)
Glucose-Capillary: 161 mg/dL — ABNORMAL HIGH (ref 65–99)
Glucose-Capillary: 150 mg/dL — ABNORMAL HIGH (ref 65–99)
Glucose-Capillary: 175 mg/dL — ABNORMAL HIGH (ref 65–99)

## 2016-05-18 NOTE — Progress Notes (Signed)
[facility] PROGRESS NOTE  [name] [id number] DOB: 01-22-1961 DOA: 05/15/2016   PCP: Arnoldo Morale, MD     Assessment/Plan: Principal Problem:   COPD exacerbation ([place]) Active Problems:   History of pulmonary embolism   Tobacco abuse   History of DVT (deep vein thrombosis)   Anemia   CAD (coronary artery disease)   HTN (hypertension)   [name] a 56 y.o.malewith medical history significant of asthma, CAD, chest pain, chronic diastolic CHF, cocaine abuse, COPD, DVT, pulmonary embolism, tobacco abuse, essential hypertension, hepatitis C, osteoarthritis who is coming to the emergency department with progressively worse shortness of breath for the past 5 days associated with productive cough of yellowish sputum, wheezing and fatigue despite home use of bronchodilators. He denies fever, chills, rhinorrhea, sore throat, but complains of pleuritic chest pain. He denies nausea, emesis, dizziness, diarrhea, constipation, melena or hematochezia. He denies dysuria or frequency.  ED Course:The patient was given a continuous 10 mg albuterol nebulizer treatment and was started on BiPAP ventilation. His urinalysis was hazy in color, but otherwise within normal limits. EKG was sinus tachycardia with biatrial enlargement. WBC 4.3, hemoglobin 11.5 g/dL and platelets 152. LFTs show an AST of 50, otherwise within normal limits. Sodium 139, potassium 3.4, chloride 104 and CO2 21 mmol/L. BUN was 5, creatinine 0.6 and glucose 109 mg/dL. Chest radiograph did not show any acute pathology.    HOSPITAL COURSE:   acuteCOPD exacerbation [facility]) Patient has improved with supportive care. No pneumonia on chest x-ray Weaned off oxygen Weaned off BiPAP Treated with scheduled and as needed bronchodilators.,high dose Solu-Medrol.  Also started on azithromycin /rocephin 2/21 Also started [facility]  Still feels sob, does not feel he can go home    History of pulmonary  embolism- History of DVT (deep vein thrombosis) Continue Eliquis.   Hyperglycemia Started on sliding scale insulin , hemoglobin A1c 5.4 Likely driven by high-dose steroids   Tobacco abuse Nicotine replacement was declined by the patient.  Anemia Monitor hematocrit and hemoglobin. Baseline hemoglobin is 12.0. No signs of active bleeding. Needs evaluation for his anemia in the outpatient setting  CAD (coronary artery disease) Continue atorvastatin. Will start aspirin 81 mg by mouth daily.  HTN (hypertension) Not on antihypertensives at this time Monitor blood pressure closely.  Chronic pain syndrome Patient is chronically on narcotics Avoid IV narcotics in the hospital      DVT prophylaxsis eliquis  Code Status:  Full code     Family Communication: Discussed in detail with the patient, all imaging results, lab results explained to the patient   Disposition Plan:  Anticipate dc in am if better      Consultants:  None   Procedures:  None   Antibiotics: Anti-infectives    Start     Dose/Rate Route Frequency Ordered Stop   05/17/16 1200  cefTRIAXone (ROCEPHIN) 1 g in dextrose 5 % 50 mL IVPB     1 g 100 mL/hr over 30 Minutes Intravenous Every 24 hours 05/17/16 1057     05/17/16 1000  azithromycin (ZITHROMAX) tablet 500 mg  Status:  Discontinued     500 mg Oral Daily 05/17/16 0743 05/17/16 1056   05/17/16 0000  azithromycin (ZITHROMAX) 500 MG tablet     500 mg Oral Daily 05/17/16 0745 05/22/16 2359         HPI/Subjective: Still sob with ambulation  Objective: Vitals:   05/18/16 0422 05/18/16 0450 05/18/16 0755 05/18/16 1241  BP:  (!) 144/85  Pulse:  83    Resp:      Temp:  97.9 F (36.6 C)    TempSrc:  Oral    SpO2: 98% 95% 94% 95%  Weight:      Height:       No intake or output data in the 24 hours ending 05/18/16 1306  Exam:  Examination:  General exam: Appears calm and comfortable  Respiratory system: some  wheezing, Respiratory effort normal. Cardiovascular system: S1 & S2 heard, RRR. No JVD, murmurs, rubs, gallops or clicks. No pedal edema. Gastrointestinal system: Abdomen is nondistended, soft and nontender. No organomegaly or masses felt. Normal bowel sounds heard. Central nervous system: Alert and oriented. No focal neurological deficits. Extremities: Symmetric 5 x 5 power. Skin: No rashes, lesions or ulcers Psychiatry: Judgement and insight appear normal. Mood & affect appropriate.     Data Reviewed: I have personally reviewed following labs and imaging studies  Micro Results Recent Results (from the past 240 hour(s))  MRSA PCR Screening     Status: None   Collection Time: 05/16/16  5:26 AM  Result Value Ref Range Status   MRSA by PCR NEGATIVE NEGATIVE Final    Comment:        The GeneXpert MRSA Assay (FDA approved for NASAL specimens only), is one component of a comprehensive MRSA colonization surveillance program. It is not intended to diagnose MRSA infection nor to guide or monitor treatment for MRSA infections.     Radiology Reports Dg Chest 2 View  Result Date: 05/15/2016 CLINICAL DATA:  Left chest pain, shortness of breath, productive cough and chills for 5 days. EXAM: CHEST  2 VIEW COMPARISON:  PA and lateral chest 01/07/2016. FINDINGS: The lungs are clear. Heart size is normal. No pneumothorax or pleural effusion. Aortic atherosclerosis noted. IMPRESSION: No acute disease. Electronically Signed   By: Inge Rise M.D.   On: 05/15/2016 21:43     CBC  Recent Labs Lab 05/15/16 2133 05/16/16 0908  WBC 4.3 3.8*  HGB 11.5* 10.7*  HCT 33.9* 32.4*  PLT 152 117*  MCV 90.6 92.3  MCH 30.7 30.5  MCHC 33.9 33.0  RDW 13.0 13.4  LYMPHSABS 1.6  --   MONOABS 0.5  --   EOSABS 0.6  --   BASOSABS 0.0  --     Chemistries   Recent Labs Lab 05/15/16 2133 05/16/16 0908  NA 139 139  K 3.4* 3.8  CL 104 107  CO2 21* 20*  GLUCOSE 109* 198*  BUN 5* 8  CREATININE  0.66 0.77  CALCIUM 9.4 8.8*  MG 1.8  --   AST 50* 54*  ALT 34 35  ALKPHOS 74 70  BILITOT 1.0 0.7   ------------------------------------------------------------------------------------------------------------------ estimated creatinine clearance is 119.9 mL/min (by C-G formula based on SCr of 0.77 mg/dL). ------------------------------------------------------------------------------------------------------------------  Recent Labs  05/16/16 0908  HGBA1C 5.4   ------------------------------------------------------------------------------------------------------------------ No results for input(s): CHOL, HDL, LDLCALC, TRIG, CHOLHDL, LDLDIRECT in the last 72 hours. ------------------------------------------------------------------------------------------------------------------ No results for input(s): TSH, T4TOTAL, T3FREE, THYROIDAB in the last 72 hours.  Invalid input(s): FREET3 ------------------------------------------------------------------------------------------------------------------ No results for input(s): VITAMINB12, FOLATE, FERRITIN, TIBC, IRON, RETICCTPCT in the last 72 hours.  Coagulation profile No results for input(s): INR, PROTIME in the last 168 hours.  No results for input(s): DDIMER in the last 72 hours.  Cardiac Enzymes  Recent Labs Lab 05/16/16 0322  TROPONINI <0.03   ------------------------------------------------------------------------------------------------------------------ Invalid input(s): POCBNP   CBG:  Recent Labs Lab 05/17/16 1149 05/17/16 1556 05/17/16 2112 05/18/16  [id number] 05/18/16 1120  GLUCAP 174* 200* 153* 161* 233*       Studies: No results found.    Lab Results  Component Value Date   HGBA1C 5.4 05/16/2016   HGBA1C 5.6 04/27/2014   HGBA1C 5.6 02/10/2012   Lab Results  Component Value Date   LDLCALC 116 (H) 01/01/2015   CREATININE 0.77 05/16/2016       Scheduled Meds: . apixaban  5 mg Oral BID  .  cefTRIAXone (ROCEPHIN)  IV  1 g Intravenous Q24H  . guaiFENesin  600 mg Oral BID  . insulin aspart  0-15 Units Subcutaneous TID WC  . insulin aspart  0-5 Units Subcutaneous QHS  . ipratropium-albuterol  3 mL Nebulization Q4H  . magnesium oxide  400 mg Oral Daily  . mouth rinse  15 mL Mouth Rinse BID  . methylPREDNISolone (SOLU-MEDROL) injection  80 mg Intravenous Q6H  . mometasone-formoterol  2 puff Inhalation BID  . potassium chloride  20 mEq Oral Once  . sodium chloride flush  3 mL Intravenous Q12H   Continuous Infusions: . albuterol 5 mg/hr (05/16/16 0317)     LOS: 2 days    Time spent: >30 MINS    [facility]  [facility] Pager [phone]. If 7PM-7AM, please contact night-coverage at [email or web address], password [facility] 05/18/2016, 1:06 PM  LOS: 2 days

## 2016-05-19 ENCOUNTER — Ambulatory Visit: Payer: Medicare Other | Admitting: Family Medicine

## 2016-05-19 DIAGNOSIS — Z86711 Personal history of pulmonary embolism: Secondary | ICD-10-CM

## 2016-05-19 LAB — GLUCOSE, CAPILLARY
GLUCOSE-CAPILLARY: 211 mg/dL — AB (ref 65–99)
Glucose-Capillary: 151 mg/dL — ABNORMAL HIGH (ref 65–99)
Glucose-Capillary: 196 mg/dL — ABNORMAL HIGH (ref 65–99)
Glucose-Capillary: 170 mg/dL — ABNORMAL HIGH (ref 65–99)

## 2016-05-19 MED ORDER — FLUTICASONE PROPIONATE 50 MCG/ACT NA SUSP
2.0000 | Freq: Two times a day (BID) | NASAL | Status: DC
  Administered 2016-05-19 – 2016-05-20 (×3): 2 via NASAL
  Filled 2016-05-19: qty 16

## 2016-05-19 MED ORDER — PANTOPRAZOLE SODIUM 40 MG PO TBEC
40.0000 mg | DELAYED_RELEASE_TABLET | Freq: Two times a day (BID) | ORAL | Status: DC
  Administered 2016-05-19 – 2016-05-20 (×3): 40 mg via ORAL
  Filled 2016-05-19 (×3): qty 1

## 2016-05-19 MED ORDER — IPRATROPIUM-ALBUTEROL 0.5-2.5 (3) MG/3ML IN SOLN
3.0000 mL | Freq: Three times a day (TID) | RESPIRATORY_TRACT | Status: DC
  Administered 2016-05-19 – 2016-05-20 (×4): 3 mL via RESPIRATORY_TRACT
  Filled 2016-05-19 (×4): qty 3

## 2016-05-19 MED ORDER — MOMETASONE FURO-FORMOTEROL FUM 200-5 MCG/ACT IN AERO: 2.0000 | INHALATION_SPRAY | Freq: Two times a day (BID) | RESPIRATORY_TRACT | 0 refills | Status: DC

## 2016-05-19 MED ORDER — ALBUTEROL SULFATE (5 MG/ML) 0.5% IN NEBU: 2.5000 mg | INHALATION_SOLUTION | Freq: Four times a day (QID) | RESPIRATORY_TRACT | 12 refills | Status: DC | PRN

## 2016-05-19 NOTE — Progress Notes (Signed)
Pt ambulated 545ft on room air.  O2 Sat 97%, BP 147/91, HR 93, R 21.

## 2016-05-19 NOTE — Consult Note (Signed)
Name: [name] MRN: [id number] DOB: 29-Mar-1960    ADMISSION DATE:  05/15/2016 CONSULTATION DATE:  2/22  REFERRING MD :  Allyson Sabal   CHIEF COMPLAINT:  COPD not responding to treatment   BRIEF PATIENT DESCRIPTION:  56 year old male w/ chronic persistent asthma. Admitted 2/19 w/ acute bronchitis and asthmatic exacerbation. We were asked to see 2/23 as although the pt felt better he still had wheeze and was reluctant to go home.   SIGNIFICANT EVENTS    STUDIES:     HISTORY OF PRESENT ILLNESS:   This is a 56 year old male w/ sig h/o persistent asthma and prior PE who was admitted on 2/19 w/ report of 5d h/o productive cough (yellow sputum), wheezing, increased fatigue and worsening shortness of breath not responding to rescue SABA. His initial CXR was clear. He was admitted w/ plan to treat acute bronchospasm. Therapeutic interventions included:NIPPV (initially), supplemental oxygen (now off),  scheduled BDs, antibiotics and systemic steroids. He made slow but gradual improvement with these measures but did not feel safe to go home as of 2/22 so pulmonary was asked to see and make additional recommendations to assist w/ his dyspnea.   PAST MEDICAL HISTORY :   has a past medical history of Asthma; CAD (coronary artery disease); Chest pain (08/2015); Chronic diastolic CHF (congestive heart failure) ([place]); Cocaine abuse; COPD (chronic obstructive pulmonary disease) ([place]); DVT (deep venous thrombosis) ([place]); Essential hypertension; Hepatitis C; History of pneumonia; Osteoarthritis; Pulmonary embolism ([place]); Shortness of breath dyspnea; Sinus headache; and Tobacco abuse.  has a past surgical history that includes Vena cava filter placement (" ~ 2012); Coronary angioplasty with stent (10/21/13); left heart catheterization with coronary angiogram (N/A, 10/21/2013); Cardiac catheterization (N/A, 01/04/2015); and Cyst excision (N/A, 05/10/2016). Prior to Admission medications   Medication Sig Start  Date End Date Taking? Authorizing Provider  albuterol (PROVENTIL) (5 MG/ML) 0.5% nebulizer solution Take 2.5 mg by nebulization every 6 (six) hours as needed for wheezing or shortness of breath.   Yes Historical Provider, MD  atorvastatin (LIPITOR) 40 MG tablet Take 1 tablet (40 mg total) by mouth daily. 03/28/16  Yes Arnoldo Morale, MD  doxycycline (VIBRAMYCIN) 100 MG capsule Take 100 mg by mouth daily. Take one capsule by mouth daily with food   Yes Historical Provider, MD  ELIQUIS 2.5 MG TABS tablet Take 2 tablets (5 mg total) by mouth 2 (two) times daily. 03/28/16  Yes Arnoldo Morale, MD  Fluticasone-Salmeterol (ADVAIR DISKUS) 250-50 MCG/DOSE AEPB Inhale 1 puff into the lungs 2 (two) times daily. 03/28/16  Yes Arnoldo Morale, MD  HYDROcodone-acetaminophen (NORCO/VICODIN) 5-325 MG tablet Take 1-2 tablets by mouth every 6 (six) hours as needed for moderate pain. 05/10/16  Yes Donnie Mesa, MD  azithromycin (ZITHROMAX) 500 MG tablet Take 1 tablet (500 mg total) by mouth daily. 05/17/16 05/22/16  Reyne Dumas, MD  guaiFENesin (MUCINEX) 600 MG 12 hr tablet Take 1 tablet (600 mg total) by mouth 2 (two) times daily. 05/17/16 05/27/16  Reyne Dumas, MD  magnesium oxide (MAG-OX) 400 (241.3 Mg) MG tablet Take 1 tablet (400 mg total) by mouth daily. 05/17/16   Reyne Dumas, MD  predniSONE (DELTASONE) 50 MG tablet Take 1 tablet (50 mg total) by mouth daily with breakfast. 05/17/16   Reyne Dumas, MD  VENTOLIN HFA 108 (90 Base) MCG/ACT inhaler Inhale 2 puffs into the lungs every 6 (six) hours as needed for wheezing or shortness of breath. 05/17/16   Reyne Dumas, MD   Allergies  Allergen  Reactions  . No Known Allergies     FAMILY HISTORY:  family history includes Allergies in his brother, mother, and sister; Asthma in his mother; Coronary artery disease in his father; Deep vein thrombosis in his brother; Heart attack in his father. SOCIAL HISTORY:  reports that he has been smoking Cigarettes.  He has a 9.50 pack-year  smoking history. He has never used smokeless tobacco. He reports that he drinks about 4.2 oz of alcohol per week . He reports that he does not use drugs.  REVIEW OF SYSTEMS:   Constitutional: Negative for fever, chills, weight loss, malaise/fatigue and diaphoresis.  HENT: Negative for hearing loss, ear pain, nosebleeds, congestion, sore throat, neck pain, tinnitus and ear discharge.   Eyes: Negative for blurred vision, double vision, photophobia, pain, discharge and redness.  Respiratory: +cough, hemoptysis, sputum production, shortness of breath, wheezing and stridor.   Cardiovascular: Negative for chest pain, palpitations, orthopnea, claudication, leg swelling and PND.  Gastrointestinal: + heartburn, nausea, vomiting, abdominal pain, diarrhea, constipation, blood in stool and melena.  Genitourinary: Negative for dysuria, urgency, frequency, hematuria and flank pain.  Musculoskeletal: Negative for myalgias, back pain, joint pain and falls.  Skin: Negative for itching and rash.  Neurological: Negative for dizziness, tingling, tremors, sensory change, speech change, focal weakness, seizures, loss of consciousness, weakness and headaches.  Endo/Heme/Allergies: Negative for environmental allergies and polydipsia. Does not bruise/bleed easily.  SUBJECTIVE:  Feels better   VITAL SIGNS: Temp:  [98.3 F (36.8 C)-99 F (37.2 C)] 98.3 F (36.8 C) (02/23 1215) Pulse Rate:  [71-93] 93 (02/23 1215) Resp:  [18-21] 21 (02/23 1215) BP: (144-159)/(87-94) 147/91 (02/23 1215) SpO2:  [95 %-99 %] 98 % (02/23 1413)  PHYSICAL EXAMINATION: General:  58 yom currently lying in bed. No distress.  Neuro:  Awake, oriented. No focal def  HEENT:  NCAT, no jvd. Marked upper airway wheeze  Cardiovascular:  Rrr, no MRG Lungs:  Exp wheeze. No accessory use.  Abdomen:  Soft, + bowel sounds  Musculoskeletal:  Equal st and bulk  Skin:  Warm and dry    Recent Labs Lab 05/15/16 2133 05/16/16 0908  NA 139 139  K  3.4* 3.8  CL 104 107  CO2 21* 20*  BUN 5* 8  CREATININE 0.66 0.77  GLUCOSE 109* 198*    Recent Labs Lab 05/15/16 2133 05/16/16 0908  HGB 11.5* 10.7*  HCT 33.9* 32.4*  WBC 4.3 3.8*  PLT 152 117*   No results found.  ASSESSMENT / PLAN:  H/o persistent asthma not COPD (baseline FEV1 86% per our records) Asthmatic exacerbation  Acute purulent bronchitis  Vocal cord dysfxn GERD/reflux  H/o DVT/PE  Discussion  He does feel better. His main concern is his audible wheeze. This wheeze is primarily upper airway related and not as much true bronchospasm. This kind of wheeze is typically best treated w/ focusing on treatment of exacerbating components such as reflux and post-nasal gtt. Of note he ran out of his controller advair about 2 days prior to admit so doubt that helped. Also he seems to have significant knowledge deficits about his inhaler regimen in general.   Plan Home regimen: advair BID and PRN proair Add protonix BID pred taper over about 10d Complete 5d abx (2 more days) Cont NOAC F/u w/ NP T parrett Tuesday March 6 at 4pm; and then Dr Elsworth Soho April 4 at 145 pm   Erick Colace ACNP-BC [place] Pager # [phone] OR # [phone] if no answer  05/19/2016, 2:58 PM

## 2016-05-19 NOTE — Progress Notes (Signed)
[facility] PROGRESS NOTE  [name] [id number] DOB: 1961/02/04 DOA: 05/15/2016   PCP: Arnoldo Morale, MD     Assessment/Plan: Principal Problem:   COPD exacerbation ([place]) Active Problems:   History of pulmonary embolism   Tobacco abuse   History of DVT (deep vein thrombosis)   Anemia   CAD (coronary artery disease)   HTN (hypertension)   [name] a 56 y.o.malewith medical history significant of asthma, CAD, chest pain, chronic diastolic CHF, cocaine abuse, COPD, DVT, pulmonary embolism, tobacco abuse, essential hypertension, hepatitis C, osteoarthritis who is coming to the emergency department with progressively worse shortness of breath for the past 5 days associated with productive cough of yellowish sputum, wheezing and fatigue despite home use of bronchodilators. He denies fever, chills, rhinorrhea, sore throat, but complains of pleuritic chest pain. He denies nausea, emesis, dizziness, diarrhea, constipation, melena or hematochezia. He denies dysuria or frequency.  ED Course:The patient was given a continuous 10 mg albuterol nebulizer treatment and was started on BiPAP ventilation. His urinalysis was hazy in color, but otherwise within normal limits. EKG was sinus tachycardia with biatrial enlargement. WBC 4.3, hemoglobin 11.5 g/dL and platelets 152. LFTs show an AST of 50, otherwise within normal limits. Sodium 139, potassium 3.4, chloride 104 and CO2 21 mmol/L. BUN was 5, creatinine 0.6 and glucose 109 mg/dL. Chest radiograph did not show any acute pathology.    HOSPITAL COURSE:   acuteCOPD exacerbation (HCC),Asthma exacerbation Bronciectasis,probable upper airway wheeze with vocal cord dysfunction Patient has improved with supportive care. No pneumonia on chest x-ray Weaned off oxygen Weaned off BiPAP Treated with scheduled and as needed bronchodilators.,high dose Solu-Medrol.  Also started on azithromycin /rocephin 2/21 Also started  [facility]  Still feels sob, does not feel he can go home  needs follow up in clinic for FENO, full PFTs with flow loops, allergy profile, IgE levels, assessment for ABPA Continue albtuerol PRN - Add Protonix - Pred taper for 10 days Dc in am per PCCM  History of pulmonary embolism- History of DVT (deep vein thrombosis) Continue Eliquis.   Hyperglycemia Started on sliding scale insulin , hemoglobin A1c 5.4 Likely driven by high-dose steroids   Tobacco abuse Nicotine replacement was declined by the patient.  Anemia Monitor hematocrit and hemoglobin. Baseline hemoglobin is 12.0. No signs of active bleeding. Needs evaluation for his anemia in the outpatient setting  CAD (coronary artery disease) Continue atorvastatin. Will start aspirin 81 mg by mouth daily.  HTN (hypertension) Not on antihypertensives at this time Monitor blood pressure closely.  Chronic pain syndrome Patient is chronically on narcotics Avoid IV narcotics in the hospital      DVT prophylaxsis eliquis  Code Status:  Full code     Family Communication: Discussed in detail with the patient, all imaging results, lab results explained to the patient   Disposition Plan:  Anticipate dc in am if better      Consultants:  PCCM   Procedures:  None   Antibiotics: Anti-infectives    Start     Dose/Rate Route Frequency Ordered Stop   05/17/16 1200  cefTRIAXone (ROCEPHIN) 1 g in dextrose 5 % 50 mL IVPB     1 g 100 mL/hr over 30 Minutes Intravenous Every 24 hours 05/17/16 1057     05/17/16 1000  azithromycin (ZITHROMAX) tablet 500 mg  Status:  Discontinued     500 mg Oral Daily 05/17/16 0743 05/17/16 1056   05/17/16 0000  azithromycin (ZITHROMAX) 500 MG  tablet     500 mg Oral Daily 05/17/16 0745 05/22/16 2359         HPI/Subjective: Still sob with ambulation, vitals stable with ambulation  Objective: Vitals:   05/19/16 0448 05/19/16 0732 05/19/16 1215 05/19/16  1413  BP: (!) 151/94  (!) 147/91   Pulse: 71  93   Resp: 18  (!) 21   Temp: 98.4 F (36.9 C)  98.3 F (36.8 C)   TempSrc: Oral  Oral   SpO2: 98% 98% 97% 98%  Weight:      Height:       No intake or output data in the 24 hours ending 05/19/16 1825  Exam:  Examination:  General exam: Appears calm and comfortable  Respiratory system: some wheezing, Respiratory effort normal. Cardiovascular system: S1 & S2 heard, RRR. No JVD, murmurs, rubs, gallops or clicks. No pedal edema. Gastrointestinal system: Abdomen is nondistended, soft and nontender. No organomegaly or masses felt. Normal bowel sounds heard. Central nervous system: Alert and oriented. No focal neurological deficits. Extremities: Symmetric 5 x 5 power. Skin: No rashes, lesions or ulcers Psychiatry: Judgement and insight appear normal. Mood & affect appropriate.     Data Reviewed: I have personally reviewed following labs and imaging studies  Micro Results Recent Results (from the past 240 hour(s))  MRSA PCR Screening     Status: None   Collection Time: 05/16/16  5:26 AM  Result Value Ref Range Status   MRSA by PCR NEGATIVE NEGATIVE Final    Comment:        The GeneXpert MRSA Assay (FDA approved for NASAL specimens only), is one component of a comprehensive MRSA colonization surveillance program. It is not intended to diagnose MRSA infection nor to guide or monitor treatment for MRSA infections.     Radiology Reports Dg Chest 2 View  Result Date: 05/15/2016 CLINICAL DATA:  Left chest pain, shortness of breath, productive cough and chills for 5 days. EXAM: CHEST  2 VIEW COMPARISON:  PA and lateral chest 01/07/2016. FINDINGS: The lungs are clear. Heart size is normal. No pneumothorax or pleural effusion. Aortic atherosclerosis noted. IMPRESSION: No acute disease. Electronically Signed   By: Inge Rise M.D.   On: 05/15/2016 21:43     CBC  Recent Labs Lab 05/15/16 2133 05/16/16 0908  WBC 4.3 3.8*   HGB 11.5* 10.7*  HCT 33.9* 32.4*  PLT 152 117*  MCV 90.6 92.3  MCH 30.7 30.5  MCHC 33.9 33.0  RDW 13.0 13.4  LYMPHSABS 1.6  --   MONOABS 0.5  --   EOSABS 0.6  --   BASOSABS 0.0  --     Chemistries   Recent Labs Lab 05/15/16 2133 05/16/16 0908  NA 139 139  K 3.4* 3.8  CL 104 107  CO2 21* 20*  GLUCOSE 109* 198*  BUN 5* 8  CREATININE 0.66 0.77  CALCIUM 9.4 8.8*  MG 1.8  --   AST 50* 54*  ALT 34 35  ALKPHOS 74 70  BILITOT 1.0 0.7   ------------------------------------------------------------------------------------------------------------------ estimated creatinine clearance is 119.9 mL/min (by C-G formula based on SCr of 0.77 mg/dL). ------------------------------------------------------------------------------------------------------------------ No results for input(s): HGBA1C in the last 72 hours. ------------------------------------------------------------------------------------------------------------------ No results for input(s): CHOL, HDL, LDLCALC, TRIG, CHOLHDL, LDLDIRECT in the last 72 hours. ------------------------------------------------------------------------------------------------------------------ No results for input(s): TSH, T4TOTAL, T3FREE, THYROIDAB in the last 72 hours.  Invalid input(s): FREET3 ------------------------------------------------------------------------------------------------------------------ No results for input(s): VITAMINB12, FOLATE, FERRITIN, TIBC, IRON, RETICCTPCT in the last 72 hours.  Coagulation  profile No results for input(s): INR, PROTIME in the last 168 hours.  No results for input(s): DDIMER in the last 72 hours.  Cardiac Enzymes  Recent Labs Lab 05/16/16 0322  TROPONINI <0.03   ------------------------------------------------------------------------------------------------------------------ Invalid input(s): POCBNP   CBG:  Recent Labs Lab 05/18/16 1654 05/18/16 2159 05/19/16 0634 05/19/16 1058  05/19/16 1727  GLUCAP 150* 175* 151* 196* 211*       Studies: No results found.    Lab Results  Component Value Date   HGBA1C 5.4 05/16/2016   HGBA1C 5.6 04/27/2014   HGBA1C 5.6 02/10/2012   Lab Results  Component Value Date   LDLCALC 116 (H) 01/01/2015   CREATININE 0.77 05/16/2016       Scheduled Meds: . apixaban  5 mg Oral BID  . cefTRIAXone (ROCEPHIN)  IV  1 g Intravenous Q24H  . fluticasone  2 spray Each Nare BID  . guaiFENesin  600 mg Oral BID  . insulin aspart  0-15 Units Subcutaneous TID WC  . insulin aspart  0-5 Units Subcutaneous QHS  . ipratropium-albuterol  3 mL Nebulization TID  . magnesium oxide  400 mg Oral Daily  . mouth rinse  15 mL Mouth Rinse BID  . methylPREDNISolone (SOLU-MEDROL) injection  80 mg Intravenous Q6H  . mometasone-formoterol  2 puff Inhalation BID  . pantoprazole  40 mg Oral BID  . potassium chloride  20 mEq Oral Once  . sodium chloride flush  3 mL Intravenous Q12H   Continuous Infusions:    LOS: 3 days    Time spent: >30 MINS    [facility]  [facility] Pager [phone]. If 7PM-7AM, please contact night-coverage at [email or web address], password [facility] 05/19/2016, 6:25 PM  LOS: 3 days

## 2016-05-20 LAB — GLUCOSE, CAPILLARY: GLUCOSE-CAPILLARY: 147 mg/dL — AB (ref 65–99)

## 2016-05-20 MED ORDER — FLUTICASONE-SALMETEROL 250-50 MCG/DOSE IN AEPB: 1.0000 | INHALATION_SPRAY | Freq: Two times a day (BID) | RESPIRATORY_TRACT | 3 refills | Status: DC

## 2016-05-20 MED ORDER — FLUTICASONE PROPIONATE 50 MCG/ACT NA SUSP: 2.0000 | Freq: Two times a day (BID) | NASAL | 2 refills | Status: DC

## 2016-05-20 MED ORDER — PANTOPRAZOLE SODIUM 40 MG PO TBEC: 40.0000 mg | DELAYED_RELEASE_TABLET | Freq: Every day | ORAL | 0 refills | Status: DC

## 2016-05-20 MED ORDER — PREDNISONE 10 MG PO TABS: ORAL_TABLET | ORAL | 0 refills | Status: DC

## 2016-05-20 NOTE — Discharge Summary (Signed)
Physician Discharge Summary  [name] MRN: [id number] DOB/AGE: [birth year] 56 y.o.  PCP: Arnoldo Morale, MD   Admit date: 05/15/2016 Discharge date: 05/20/2016  Discharge Diagnoses:    Principal Problem:   COPD exacerbation ([place]) Active Problems:   History of pulmonary embolism   Tobacco abuse   History of DVT (deep vein thrombosis)   Anemia   CAD (coronary artery disease)   HTN (hypertension)    Follow-up recommendations Follow-up with PCP in 3-5 days , including all  additional recommended appointments as below Follow-up CBC, CMP in 3-5 days Patient to follow-up with pulmonary as scheduled        Current Discharge Medication List    START taking these medications   Details  azithromycin (ZITHROMAX) 500 MG tablet Take 1 tablet (500 mg total) by mouth daily. Qty: 5 tablet, Refills: 0    fluticasone (FLONASE) 50 MCG/ACT nasal spray Place 2 sprays into both nostrils 2 (two) times daily. Qty: 16 g, Refills: 2    guaiFENesin (MUCINEX) 600 MG 12 hr tablet Take 1 tablet (600 mg total) by mouth 2 (two) times daily. Qty: 20 tablet, Refills: 0    magnesium oxide (MAG-OX) 400 (241.3 Mg) MG tablet Take 1 tablet (400 mg total) by mouth daily. Qty: 30 tablet, Refills: 0    pantoprazole (PROTONIX) 40 MG tablet Take 1 tablet (40 mg total) by mouth daily. Qty: 30 tablet, Refills: 0    predniSONE (DELTASONE) 10 MG tablet 6 tablets 4 days, 5 tablets 4 days, 4 tablets 4 days, 3 tablets 4 days, 2 tablets 4 days, 1 tablet 4 days, then discontinue Qty: 120 tablet, Refills: 0      CONTINUE these medications which have CHANGED   Details  albuterol (PROVENTIL) (5 MG/ML) 0.5% nebulizer solution Take 0.5 mLs (2.5 mg total) by nebulization every 6 (six) hours as needed for wheezing or shortness of breath. Qty: 20 mL, Refills: 12    Fluticasone-Salmeterol (ADVAIR DISKUS) 250-50 MCG/DOSE AEPB Inhale 1 puff into the lungs 2 (two) times daily. Qty: 1 each, Refills: 3    Associated Diagnoses: Other emphysema (HCC)    VENTOLIN HFA 108 (90 Base) MCG/ACT inhaler Inhale 2 puffs into the lungs every 6 (six) hours as needed for wheezing or shortness of breath. Qty: 1 Inhaler, Refills: 3      CONTINUE these medications which have NOT CHANGED   Details  atorvastatin (LIPITOR) 40 MG tablet Take 1 tablet (40 mg total) by mouth daily. Qty: 30 tablet, Refills: 3    ELIQUIS 2.5 MG TABS tablet Take 2 tablets (5 mg total) by mouth 2 (two) times daily. Qty: 60 tablet, Refills: 0    HYDROcodone-acetaminophen (NORCO/VICODIN) 5-325 MG tablet Take 1-2 tablets by mouth every 6 (six) hours as needed for moderate pain. Qty: 30 tablet, Refills: 0      STOP taking these medications     doxycycline (VIBRAMYCIN) 100 MG capsule          Discharge Condition: Stable   Discharge Instructions Get Medicines reviewed and adjusted: Please take all your medications with you for your next visit with your Primary MD  Please request your Primary MD to go over all hospital tests and procedure/radiological results at the follow up, please ask your Primary MD to get all Hospital records sent to his/her office.  If you experience worsening of your admission symptoms, develop shortness of breath, life threatening emergency, suicidal or homicidal thoughts you must seek medical attention immediately by calling 911 or calling your  MD immediately if symptoms less severe.  You must read complete instructions/literature along with all the possible adverse reactions/side effects for all the Medicines you take and that have been prescribed to you. Take any new Medicines after you have completely understood and accpet all the possible adverse reactions/side effects.   Do not drive when taking Pain medications.   Do not take more than prescribed Pain, Sleep and Anxiety Medications  Special Instructions: If you have smoked or chewed Tobacco in the last 2 yrs please stop smoking, stop any  regular Alcohol and or any Recreational drug use.  Wear Seat belts while driving.  Please note  You were cared for by a hospitalist during your hospital stay. Once you are discharged, your primary care physician will handle any further medical issues. Please note that NO REFILLS for any discharge medications will be authorized once you are discharged, as it is imperative that you return to your primary care physician (or establish a relationship with a primary care physician if you do not have one) for your aftercare needs so that they can reassess your need for medications and monitor your lab values.     Allergies  Allergen Reactions  . No Known Allergies       Disposition: 01-Home or Self Care   Consults:   Pulmonary     Significant Diagnostic Studies:  Dg Chest 2 View  Result Date: 05/15/2016 CLINICAL DATA:  Left chest pain, shortness of breath, productive cough and chills for 5 days. EXAM: CHEST  2 VIEW COMPARISON:  PA and lateral chest 01/07/2016. FINDINGS: The lungs are clear. Heart size is normal. No pneumothorax or pleural effusion. Aortic atherosclerosis noted. IMPRESSION: No acute disease. Electronically Signed   By: Inge Rise M.D.   On: 05/15/2016 21:43      Filed Weights   05/15/16 2124 05/16/16 1747  Weight: 88 kg (194 lb) 89.9 kg (198 lb 4.8 oz)     Microbiology: Recent Results (from the past 240 hour(s))  MRSA PCR Screening     Status: None   Collection Time: 05/16/16  5:26 AM  Result Value Ref Range Status   MRSA by PCR NEGATIVE NEGATIVE Final    Comment:        The GeneXpert MRSA Assay (FDA approved for NASAL specimens only), is one component of a comprehensive MRSA colonization surveillance program. It is not intended to diagnose MRSA infection nor to guide or monitor treatment for MRSA infections.        Blood Culture    Component Value Date/Time   SDES BLOOD LEFT HAND 11/06/2015 0407   SPECREQUEST BOTTLES DRAWN AEROBIC  ONLY 5CC 11/06/2015 0407   CULT  11/06/2015 0407    NO GROWTH 5 DAYS Performed at [facility] 11/11/2015 FINAL 11/06/2015 0407      Labs: Results for orders placed or performed during the hospital encounter of 05/15/16 (from the past 48 hour(s))  Glucose, capillary     Status: Abnormal   Collection Time: 05/18/16 11:20 AM  Result Value Ref Range   Glucose-Capillary 233 (H) 65 - 99 mg/dL   Comment 1 Notify RN    Comment 2 Document in Chart   Glucose, capillary     Status: Abnormal   Collection Time: 05/18/16  4:54 PM  Result Value Ref Range   Glucose-Capillary 150 (H) 65 - 99 mg/dL   Comment 1 Notify RN    Comment 2 Document in Chart   Glucose, capillary  Status: Abnormal   Collection Time: 05/18/16  9:59 PM  Result Value Ref Range   Glucose-Capillary 175 (H) 65 - 99 mg/dL   Comment 1 Document in Chart   Glucose, capillary     Status: Abnormal   Collection Time: 05/19/16  6:34 AM  Result Value Ref Range   Glucose-Capillary 151 (H) 65 - 99 mg/dL  Glucose, capillary     Status: Abnormal   Collection Time: 05/19/16 10:58 AM  Result Value Ref Range   Glucose-Capillary 196 (H) 65 - 99 mg/dL   Comment 1 Notify RN    Comment 2 Document in Chart   Glucose, capillary     Status: Abnormal   Collection Time: 05/19/16  5:27 PM  Result Value Ref Range   Glucose-Capillary 211 (H) 65 - 99 mg/dL   Comment 1 Notify RN    Comment 2 Document in Chart   Glucose, capillary     Status: Abnormal   Collection Time: 05/19/16  7:58 PM  Result Value Ref Range   Glucose-Capillary 170 (H) 65 - 99 mg/dL  Glucose, capillary     Status: Abnormal   Collection Time: 05/20/16  6:12 AM  Result Value Ref Range   Glucose-Capillary 147 (H) 65 - 99 mg/dL     Lipid Panel     Component Value Date/Time   CHOL 190 01/01/2015 0208   TRIG 60 01/01/2015 0208   HDL 62 01/01/2015 0208   CHOLHDL 3.1 01/01/2015 0208   VLDL 12 01/01/2015 0208   LDLCALC 116 (H) 01/01/2015 0208      Lab Results  Component Value Date   HGBA1C 5.4 05/16/2016   HGBA1C 5.6 04/27/2014   HGBA1C 5.6 02/10/2012      HPI :*  This is a 56 year old male w/ sig h/o persistent asthma and prior PE who was admitted on 2/19 w/ report of 5d h/o productive cough (yellow sputum), wheezing, increased fatigue and worsening shortness of breath not responding to rescue SABA. His initial CXR was clear. He was admitted w/ plan to treat acute bronchospasm. Therapeutic interventions included:NIPPV (initially), supplemental oxygen (now off),  scheduled BDs, antibiotics and systemic steroids. He made slow but gradual improvement with these measures but did not feel safe to go home as of 2/22 so pulmonary was asked to see and make additional recommendations to assist w/ his dyspnea  HOSPITAL COURSE:   acuteCOPD exacerbation (HCC),Asthma exacerbation Bronciectasis,probable upper airway wheeze with vocal cord dysfunction Patient has improved with supportive care.No pneumonia on chest x-ray Weaned off oxygen Weaned off BiPAP Treated withscheduled and as needed bronchodilators.,high dose Solu-Medrol. Also started on azithromycin /rocephin 2/21 Also started [facility]  Pulmonary consultant due to slow progress As per pulmonary, needs follow up in clinic for FENO, full PFTs with flow loops, allergy profile, IgE levels, assessment for ABPA Continue albtuerol PRN - Added  Protonix - Continue slow outpatient prednisone taper  Okay to discharge home on 2/24 per pulmonary  History of pulmonary embolism- History of DVT (deep vein thrombosis) Continue Eliquis.   Hyperglycemia Started on sliding scale insulin , hemoglobin A1c 5.4 Likely driven by high-dose steroids Now stable  Tobacco abuse Nicotine replacement was declined by the patient.  Anemia Monitor hematocrit and hemoglobin. Baseline hemoglobin is 12.0. No signs of active bleeding. Needs evaluation for his anemia in the outpatient  setting  CAD (coronary artery disease) Continue atorvastatin. Will start aspirin 81 mg by mouth daily.  HTN (hypertension) Not on antihypertensives at this time Monitor blood  pressure closely.  Chronic pain syndrome Patient is chronically on narcotics No refills provided     Discharge Exam:   Blood pressure (!) 154/88, pulse 79, temperature 98.6 F (37 C), temperature source Oral, resp. rate (!) 24, height 6\' 2"  (1.88 m), weight 89.9 kg (198 lb 4.8 oz), SpO2 96 %.  Cardiovascular: Normal rate, regular rhythm and intact distal pulses.   Pulmonary/Chest: Improved Accessory muscle usage present. No stridor. T . No respiratory distress. He has slight wheezes. He has no rales.  Abdominal: Soft. Bowel is  sounds are normal. He exhibits no distension. There is no tenderness. There is no rebound and no guarding.  Musculoskeletal: He exhibits no edema or tenderness.  Neurological: He is alert. He has normal strength. No cranial nerve deficit     Follow-up Information    Arnoldo Morale, MD. Call.   Specialty:  Family Medicine Why:  To make appointment, hospital follow-up Contact information: [place] [address] [phone]        Rexene Edison, NP Follow up on 05/30/2016.   Specialty:  Pulmonary Disease Why:  at 4pm  Contact information: [address] [address] [id number]        Rigoberto Noel., MD Follow up on 07/17/2016.   Specialty:  Pulmonary Disease Why:  at 145p Contact information: [address] [address] [phone]           Signed: Reyne Dumas 05/20/2016, 7:44 AM        Time spent >45 mins

## 2016-05-25 ENCOUNTER — Ambulatory Visit (HOSPITAL_COMMUNITY)
Admission: RE | Admit: 2016-05-25 | Discharge: 2016-05-25 | Disposition: A | Discharge status: Home / Self Care | Payer: Medicare Other | Source: Ambulatory Visit | Attending: Cardiology | Admitting: Cardiology

## 2016-05-25 DIAGNOSIS — R0989 Other specified symptoms and signs involving the circulatory and respiratory systems: Secondary | ICD-10-CM | POA: Diagnosis not present

## 2016-05-30 ENCOUNTER — Encounter: Payer: Self-pay | Admitting: Adult Health

## 2016-05-30 ENCOUNTER — Ambulatory Visit (INDEPENDENT_AMBULATORY_CARE_PROVIDER_SITE_OTHER): Payer: Medicare Other | Admitting: Adult Health

## 2016-05-30 VITALS — BP 118/80 | HR 80 | Ht 74.0 in | Wt 192.8 lb

## 2016-05-30 DIAGNOSIS — J441 Chronic obstructive pulmonary disease with (acute) exacerbation: Secondary | ICD-10-CM | POA: Diagnosis not present

## 2016-05-30 DIAGNOSIS — J449 Chronic obstructive pulmonary disease, unspecified: Secondary | ICD-10-CM

## 2016-05-30 DIAGNOSIS — Z720 Tobacco use: Secondary | ICD-10-CM | POA: Diagnosis not present

## 2016-05-30 DIAGNOSIS — I25119 Atherosclerotic heart disease of native coronary artery with unspecified angina pectoris: Secondary | ICD-10-CM | POA: Diagnosis not present

## 2016-05-30 LAB — NITRIC OXIDE: Nitric Oxide: 15

## 2016-05-30 NOTE — Assessment & Plan Note (Signed)
Smoking cessation  

## 2016-05-30 NOTE — Assessment & Plan Note (Addendum)
COPD /AB exacerbation in active smoker -improving on current regimen  Check FENO  Labs w/ IgE /RAST , CBC w/ diff  Return for PFT   Plan  Patient Instructions  Finish Prednisone .  Continue on Advair 1 puff Twice daily .  Check labs -return in 1-2 weeks .  Follow up .Dr. Vaughan Browner in 6 weeks with PFT  Must stop smoking  Please contact office for sooner follow up if symptoms do not improve or worsen or seek emergency care

## 2016-05-30 NOTE — Addendum Note (Signed)
Addended by: Parke Poisson E on: 05/30/2016 05:20 PM   Modules accepted: Orders

## 2016-05-30 NOTE — Progress Notes (Signed)
@Patient  ID: [name], male    DOB: 05-May-1960, 56 y.o.   MRN: [id number]  No chief complaint on file.   Referring provider: Arnoldo Morale, MD  HPI: 56 year old male , active smoker with persistent asthma and prior pulmonary embolism was seen for hospital consult 05/19/2016 for severe asthma exacerbation  Hx of polysubstance abuse -Cocaine use.   05/30/2016 [facility] follow up  Patient presents for a post hospital follow-up. She was recently admitted in the hospital for an acute asthma exacerbation. He was seen with a pulmonary consult. Initially treated with BiPAP support.  Treated with IV antibiotics, steroids and nebulized bronchodilators.  Since discharge he is feeling much better. Decreased cough and wheezing  . Rare use of albuterol inhaler. Remains on Advair  Twice daily  .  Still on prednisone , few days left.   He does have a history of recurrent  DVT and PE and is  On Eliquis -lifelong .   He does continue to smoke. Smoking cessation was discussed      Allergies  Allergen Reactions  . No Known Allergies     Immunization History  Administered Date(s) Administered  . Hepatitis B, adult 02/24/2015, 04/08/2015  . Influenza Split 02/11/2012  . Influenza,inj,Quad PF,36+ Mos 02/22/2013, 01/27/2014, 12/02/2014, 02/03/2016  . Pneumococcal Polysaccharide-23 03/24/2012, 01/02/2015    Past Medical History:  Diagnosis Date  . Asthma   . CAD (coronary artery disease)    a. 09/2013 NSTEMI/PCI: LM nl, LAD 40-50%, D1 100 (2.25 x 28 Vision BMS), LCX min irregs, RI 60-70, 30, RCA 40-50/50-43ms/p, EF 45-50%.  b. cath 12/2014 -occulded BMS in diag, 50% pro to mid LAD, 60% ramus, 30% RCA   . Chest pain 08/2015  . Chronic diastolic CHF (congestive heart failure) ([place])    a. 09/2014 EF 45-50% by LV gram;  b. 01/2014 Echo: EF 55-60%, Gr 1 DD.  Marland Kitchen Cocaine abuse   . COPD (chronic obstructive pulmonary disease) ([place])   . DVT (deep venous thrombosis) ([place])    "I had ~ 10 in each  leg"  . Essential hypertension   . Hepatitis C    "clear free over a year now"  . History of pneumonia   . Osteoarthritis    a. hands and toes.  . Pulmonary embolism ([place])    a. 2012 - s/p IVC filter;  b. prev on eliquis - noncompliant.  . Shortness of breath dyspnea   . Sinus headache   . Tobacco abuse     Tobacco History: History  Smoking Status  . Current Every Day Smoker  . Packs/day: 0.25  . Years: 38.00  . Types: Cigarettes  Smokeless Tobacco  . Never Used    Comment: trying to quit 3-4 cigarettes per day   Ready to quit: Not Answered Counseling given: Not Answered   Outpatient Encounter Prescriptions as of 05/30/2016  Medication Sig  . albuterol (PROVENTIL) (5 MG/ML) 0.5% nebulizer solution Take 0.5 mLs (2.5 mg total) by nebulization every 6 (six) hours as needed for wheezing or shortness of breath.  Marland Kitchen atorvastatin (LIPITOR) 40 MG tablet Take 1 tablet (40 mg total) by mouth daily.  Marland Kitchen ELIQUIS 2.5 MG TABS tablet Take 2 tablets (5 mg total) by mouth 2 (two) times daily.  . fluticasone (FLONASE) 50 MCG/ACT nasal spray Place 2 sprays into both nostrils 2 (two) times daily.  . Fluticasone-Salmeterol (ADVAIR DISKUS) 250-50 MCG/DOSE AEPB Inhale 1 puff into the lungs 2 (two) times daily.  Marland Kitchen HYDROcodone-acetaminophen (NORCO/VICODIN) 5-325 MG  tablet Take 1-2 tablets by mouth every 6 (six) hours as needed for moderate pain.  . magnesium oxide (MAG-OX) 400 (241.3 Mg) MG tablet Take 1 tablet (400 mg total) by mouth daily.  . pantoprazole (PROTONIX) 40 MG tablet Take 1 tablet (40 mg total) by mouth daily.  . predniSONE (DELTASONE) 10 MG tablet 6 tablets 4 days, 5 tablets 4 days, 4 tablets 4 days, 3 tablets 4 days, 2 tablets 4 days, 1 tablet 4 days, then discontinue  . VENTOLIN HFA 108 (90 Base) MCG/ACT inhaler Inhale 2 puffs into the lungs every 6 (six) hours as needed for wheezing or shortness of breath.   No facility-administered encounter medications on file as of 05/30/2016.        Review of Systems  Constitutional:   No  weight loss, night sweats,  Fevers, chills, fatigue, or  lassitude.  HEENT:   No headaches,  Difficulty swallowing,  Tooth/dental problems, or  Sore throat,                No sneezing, itching, ear ache, nasal congestion, post nasal drip,   CV:  No chest pain,  Orthopnea, PND, swelling in lower extremities, anasarca, dizziness, palpitations, syncope.   GI  No heartburn, indigestion, abdominal pain, nausea, vomiting, diarrhea, change in bowel habits, loss of appetite, bloody stools.   Resp: No shortness of breath with exertion or at rest.  No excess mucus, no productive cough,  No non-productive cough,  No coughing up of blood.  No change in color of mucus.  No wheezing.  No chest wall deformity  Skin: no rash or lesions.  GU: no dysuria, change in color of urine, no urgency or frequency.  No flank pain, no hematuria   MS:  No joint pain or swelling.  No decreased range of motion.  No back pain.    Physical Exam  BP 118/80 (BP Location: Left Arm, Patient Position: Sitting, Cuff Size: Normal)   Pulse 80   Ht 6\' 2"  (1.88 m)   Wt 192 lb 12.8 oz (87.5 kg)   SpO2 97%   BMI 24.75 kg/m   GEN: A/Ox3; pleasant , NAD   HEENT:  [place]/AT,  EACs-clear, TMs-wnl, NOSE-clear, THROAT-clear, no lesions, no postnasal drip or exudate noted.   NECK:  Supple w/ fair ROM; no JVD; normal carotid impulses w/o bruits; no thyromegaly or nodules palpated; no lymphadenopathy.    RESP  Clear  P & A; w/o, wheezes/ rales/ or rhonchi. no accessory muscle use, no dullness to percussion  CARD:  RRR, no m/r/g, no peripheral edema, pulses intact, no cyanosis or clubbing.  GI:   Soft & nt; nml bowel sounds; no organomegaly or masses detected.   Musco: Warm bil, no deformities or joint swelling noted.   Neuro: alert, no focal deficits noted.    Skin: Warm, no lesions or rashes    ProBNP  Imaging: Dg Chest 2 View  Result Date: 05/15/2016 CLINICAL DATA:   Left chest pain, shortness of breath, productive cough and chills for 5 days. EXAM: CHEST  2 VIEW COMPARISON:  PA and lateral chest 01/07/2016. FINDINGS: The lungs are clear. Heart size is normal. No pneumothorax or pleural effusion. Aortic atherosclerosis noted. IMPRESSION: No acute disease. Electronically Signed   By: Inge Rise M.D.   On: 05/15/2016 21:43   Dg Foot Complete Right  Result Date: 05/22/2016 Please see detailed radiograph report in office note.    Assessment & Plan:   No problem-specific Assessment & Plan  notes found for this encounter.     Rexene Edison, NP 05/30/2016

## 2016-05-30 NOTE — Patient Instructions (Addendum)
Finish Prednisone .  Continue on Advair 1 puff Twice daily .  Check labs -return in 1-2 weeks .  Follow up .Dr. Vaughan Browner in 6 weeks with PFT  Must stop smoking  Please contact office for sooner follow up if symptoms do not improve or worsen or seek emergency care

## 2016-05-31 NOTE — Telephone Encounter (Addendum)
-----   Message from Trula Slade, DPM sent at 05/30/2016  8:12 PM EST ----- Please let him know that the circulation studies do show a decrease in blood flow to the toes but otherwise WNL. 05/31/2016-Left message to call for results. 06/01/2016-Pt called for results. I informed pt of Dr. Leigh Aurora review of results. Pt asked if Dr. Jacqualyn Posey had set him up for surgery and it reviewed LOV and Dr. Jacqualyn Posey was going to discuss with pt after circulation test and I transferred to schedulers to schedule for surgery consultation.

## 2016-06-12 ENCOUNTER — Encounter: Payer: Self-pay | Admitting: Podiatry

## 2016-06-12 ENCOUNTER — Ambulatory Visit (INDEPENDENT_AMBULATORY_CARE_PROVIDER_SITE_OTHER): Payer: Medicare Other | Admitting: Podiatry

## 2016-06-12 ENCOUNTER — Other Ambulatory Visit: Payer: Medicare Other

## 2016-06-12 DIAGNOSIS — I25119 Atherosclerotic heart disease of native coronary artery with unspecified angina pectoris: Secondary | ICD-10-CM

## 2016-06-12 DIAGNOSIS — J441 Chronic obstructive pulmonary disease with (acute) exacerbation: Secondary | ICD-10-CM

## 2016-06-12 DIAGNOSIS — J449 Chronic obstructive pulmonary disease, unspecified: Secondary | ICD-10-CM

## 2016-06-12 DIAGNOSIS — M799 Soft tissue disorder, unspecified: Secondary | ICD-10-CM | POA: Diagnosis not present

## 2016-06-12 DIAGNOSIS — M2011 Hallux valgus (acquired), right foot: Secondary | ICD-10-CM

## 2016-06-12 DIAGNOSIS — M7989 Other specified soft tissue disorders: Secondary | ICD-10-CM

## 2016-06-12 NOTE — Progress Notes (Signed)
Subjective: 56 year old male presents the office today for continued callus and pain to the right foot along the bunion. He states after I trim the callus last time it was painful for a couple of days. He states that he this area is been painful for quite some time and he would proceed with surgery to remove the callus and help limit the pressure. He currently denies any redness or drainage or any swelling. He is attended multiple conservative treatments including padding, offloading, shoe changes any improvement and he wishes to proceed with surgery at this time. Denies any systemic complaints such as fevers, chills, nausea, vomiting. No acute changes since last appointment, and no other complaints at this time.   Objective: AAO x3, NAD DP/PT pulses palpable bilaterally, CRT less than 3 seconds Large hyperkeratotic lesions present in the right medial first metatarsal head along a mild bunion. The callus measures approximately 2 x 1 7 m. There is no surrounding erythema, ascending synovitis, fluctuance, crevice, malodor. Mild HAV is present underlying the callus/soft tissue mass. No open lesions or pre-ulcerative lesions.  No pain with calf compression, swelling, warmth, erythema  Assessment: Hyperkeratotic lesion right foot along the bunion, mild HAV  Plan: -All treatment options discussed with the patient including all alternatives, risks, complications.  -Previous chart x-ray reviewed. Discussed with him his vascular studies as well. He has microvascular disease. However given the continued pain and consented if this callus continues this will get infected. I discussed with her there is a chance of amputation and he understands this and still wishes to proceed. We'll obtain cardiac clearance prior to surgery. He would need to be off eliquis as well.  -Discussed with him removal of soft tissue mass, silver bunionectomy. If needed discussed [place] bunionectomy but hopefully we can avoid this.   -The incision placement as well as the postoperative course was discussed with the patient. I discussed risks of the surgery which include, but not limited to, infection, bleeding, pain, swelling, need for further surgery, delayed or nonhealing, painful or ugly scar, numbness or sensation changes, over/under correction, recurrence, transfer lesions, further deformity, hardware failure, DVT/PE, loss of toe/foot. Patient understands these risks and wishes to proceed with surgery. The surgical consent was reviewed with the patient all 3 pages were signed. No promises or guarantees were given to the outcome of the procedure. All questions were answered to the best of my ability. Before the surgery the patient was encouraged to call the office if there is any further questions. [facility] at [facility] basis. -CAM boot dispensed for after surgery  -Patient encouraged to call the office with any questions, concerns, change in symptoms.   Celesta Gentile, DPM

## 2016-06-12 NOTE — Patient Instructions (Signed)

## 2016-06-13 LAB — RESPIRATORY ALLERGY PROFILE REGION II ~~LOC~~
ALLERGEN, COMM SILVER BIRCH, T3: 0.15 kU/L — AB
ALLERGEN, MULBERRY, T70: 0.13 kU/L — AB
Allergen, A. alternata, m6: <0.1 kU/L
Allergen, Cedar tree, t12: 0.54 kU/L — ABNORMAL HIGH
Allergen, Cottonwood, t14: 0.27 kU/L — ABNORMAL HIGH
Allergen, D pternoyssinus,d7: <0.1 kU/L
Allergen, Mouse Urine Protein, e78: <0.1 kU/L
Allergen, Oak,t7: 0.33 kU/L — ABNORMAL HIGH
Allergen, P. notatum, m1: <0.1 kU/L
Aspergillus fumigatus, m3: <0.1 kU/L
BOX ELDER: 0.51 kU/L — AB
Bermuda Grass: 0.58 kU/L — ABNORMAL HIGH
COMMON RAGWEED: 1.87 kU/L — AB
Cat Dander: <0.1 kU/L
D. farinae: <0.1 kU/L
Dog Dander: <0.1 kU/L
ELM IGE: 0.27 kU/L — AB
IGE (IMMUNOGLOBULIN E), SERUM: 235 kU/L — AB (ref <115)
Johnson Grass: 2.92 kU/L — ABNORMAL HIGH
Pecan/Hickory Tree IgE: 1.24 kU/L — ABNORMAL HIGH
ROUGH PIGWEED IGE: 0.14 kU/L — AB
SHEEP SORREL IGE: 0.23 kU/L — AB
Timothy Grass: 5.65 kU/L — ABNORMAL HIGH

## 2016-06-15 ENCOUNTER — Encounter: Payer: Self-pay | Admitting: *Deleted

## 2016-06-16 ENCOUNTER — Telehealth: Payer: Self-pay | Admitting: *Deleted

## 2016-06-16 NOTE — Progress Notes (Signed)
Called and spoke to pt. Informed him of the results and recs per TP. Pt verbalized understanding and denied any further questions or concerns at this time.  

## 2016-06-16 NOTE — Telephone Encounter (Signed)
"  I am scheduled for surgery on April 4.  I need to know what time I need to be there because I have to let my ride know.  What is their address?"  I informed you when you were here that someone from the surgical center would call you with the arrival time a day or two prior to the surgery date.  I can't give it to you because they may have cancellations or have a patient that is Diabetic or have a child that needs to go first.  All I can tell you is that it will be sometime that morning.  "What's their address?"  Their address is [address]  Their brochure is in your blue bag.  Do you still have it?  "Yes, I still have it."

## 2016-06-19 ENCOUNTER — Encounter: Payer: Self-pay | Admitting: *Deleted

## 2016-06-19 NOTE — Progress Notes (Signed)
[name] is at low risk for foot surgery  He may hold his Eliquis for 36 hours prior to procedure.

## 2016-06-23 ENCOUNTER — Telehealth: Payer: Self-pay | Admitting: *Deleted

## 2016-06-23 ENCOUNTER — Encounter: Payer: Self-pay | Admitting: *Deleted

## 2016-06-23 NOTE — Telephone Encounter (Signed)
Discussed further with Dr Oval Linsey, chart reviewed further and since patient is scheduled for surgery next week she will clear for surgery holding Eliquis preferably 36 hours or less  Letter will done and faxed

## 2016-06-23 NOTE — Telephone Encounter (Signed)
Request for surgical clearance:  1. What type of surgery is being performed? Silver Bunionectomy and Excision Benign Lesion 2.0 cm under loca/IV sedation.    2. When is this surgery scheduled? 06/28/16     3. Are there any medications that need to be held prior to surgery and how long? Eliquis   4. Name of physician performing surgery? Dr Celesta Gentile   5. What is your office phone and fax number? Phone [phone] fax [phone]   Faxed sent to office on 06/22/16 3:40 pm  Will forward to Dr Oval Linsey for review

## 2016-06-23 NOTE — Telephone Encounter (Signed)
This is not my patient.

## 2016-06-28 ENCOUNTER — Encounter: Payer: Self-pay | Admitting: Podiatry

## 2016-06-28 ENCOUNTER — Ambulatory Visit: Payer: Medicare Other | Admitting: Pulmonary Disease

## 2016-06-28 ENCOUNTER — Telehealth: Payer: Self-pay | Admitting: *Deleted

## 2016-06-28 DIAGNOSIS — K219 Gastro-esophageal reflux disease without esophagitis: Secondary | ICD-10-CM | POA: Diagnosis not present

## 2016-06-28 DIAGNOSIS — D0471 Carcinoma in situ of skin of right lower limb, including hip: Secondary | ICD-10-CM | POA: Diagnosis not present

## 2016-06-28 DIAGNOSIS — I781 Nevus, non-neoplastic: Secondary | ICD-10-CM | POA: Diagnosis not present

## 2016-06-28 DIAGNOSIS — M2011 Hallux valgus (acquired), right foot: Secondary | ICD-10-CM | POA: Diagnosis not present

## 2016-06-28 DIAGNOSIS — D2121 Benign neoplasm of connective and other soft tissue of right lower limb, including hip: Secondary | ICD-10-CM | POA: Diagnosis not present

## 2016-06-28 DIAGNOSIS — M25571 Pain in right ankle and joints of right foot: Secondary | ICD-10-CM | POA: Diagnosis not present

## 2016-06-28 DIAGNOSIS — M21611 Bunion of right foot: Secondary | ICD-10-CM | POA: Diagnosis not present

## 2016-06-28 DIAGNOSIS — D492 Neoplasm of unspecified behavior of bone, soft tissue, and skin: Secondary | ICD-10-CM | POA: Diagnosis not present

## 2016-06-28 NOTE — Telephone Encounter (Signed)
Pharmacy tech states rx written for percocet, keflex, and phenergan, but there is no date. Called pharmacy and was told that someone had called in with today's date.

## 2016-07-03 ENCOUNTER — Ambulatory Visit: Payer: Medicare Other

## 2016-07-03 ENCOUNTER — Ambulatory Visit (INDEPENDENT_AMBULATORY_CARE_PROVIDER_SITE_OTHER): Payer: Self-pay | Admitting: Podiatry

## 2016-07-03 VITALS — Temp 96.2°F

## 2016-07-03 DIAGNOSIS — M799 Soft tissue disorder, unspecified: Secondary | ICD-10-CM

## 2016-07-03 DIAGNOSIS — Z9889 Other specified postprocedural states: Secondary | ICD-10-CM

## 2016-07-03 DIAGNOSIS — M2011 Hallux valgus (acquired), right foot: Secondary | ICD-10-CM

## 2016-07-03 DIAGNOSIS — M7989 Other specified soft tissue disorders: Secondary | ICD-10-CM

## 2016-07-03 MED ORDER — HYDROCODONE-ACETAMINOPHEN 5-325 MG PO TABS: 1.0000 | ORAL_TABLET | Freq: Four times a day (QID) | ORAL | 0 refills | Status: DC | PRN

## 2016-07-04 ENCOUNTER — Encounter: Payer: Self-pay | Admitting: Podiatry

## 2016-07-05 NOTE — Progress Notes (Signed)
Subjective: [name] is a 56 y.o. is seen today in office s/p right silver bunionectomy and soft tissue mass excision preformed on 06/28/2016. They state their pain is improved. He has not been taking any pain medication as he lost the prescription. He is due for a new prescription today anyways. He states that he is in the process of moving and during that she lost the prescription. He has remained in a cam boot. He is continuing antibiotics. Denies any systemic complaints such as fevers, chills, nausea, vomiting. No calf pain, chest pain, shortness of breath.   Objective: General: No acute distress, AAOx3  DP/PT pulses palpable 2/4, CRT < 3 sec to all digits.  Protective sensation intact. Motor function intact.  Right foot: Incision is well coapted without any evidence of dehiscence and sutures intact. There is no surrounding erythema, ascending cellulitis, fluctuance, crepitus, malodor, drainage/purulence. There is mild edema around the surgical site. There is mild pain along the surgical site. He states he is very happy that the area is smooth.  No other areas of tenderness to bilateral lower extremities.  No other open lesions or pre-ulcerative lesions.  No pain with calf compression, swelling, warmth, erythema.   Assessment and Plan:  Status post right foot surgery, doing well with no complications   -Treatment options discussed including all alternatives, risks, and complications -X-rays were obtained and reviewed with the patient. No evidence of acute fracture.  -Antibiotic ointment applied followed by a bandage. Daily dressing clean, dry, intact. -Ice/elevation -Pain medication as needed.refilled today. -Monitor for any clinical signs or symptoms of infection and DVT/PE and directed to call the office immediately should any occur or go to the ER. -Follow-up in 10 days for possible suture removal or sooner if any problems arise. In the meantime, encouraged to call the office with  any questions, concerns, change in symptoms.   Celesta Gentile, DPM

## 2016-07-12 ENCOUNTER — Telehealth: Payer: Self-pay | Admitting: *Deleted

## 2016-07-12 NOTE — Telephone Encounter (Signed)
Called and spoke with the patient and asked how he was doing after surgery and patient stated he was doing good and the boot was driving him crazy. Lattie Haw

## 2016-07-13 ENCOUNTER — Ambulatory Visit (INDEPENDENT_AMBULATORY_CARE_PROVIDER_SITE_OTHER): Payer: Medicare Other | Admitting: Podiatry

## 2016-07-13 ENCOUNTER — Encounter: Payer: Self-pay | Admitting: Podiatry

## 2016-07-13 ENCOUNTER — Ambulatory Visit (INDEPENDENT_AMBULATORY_CARE_PROVIDER_SITE_OTHER): Payer: Medicare Other

## 2016-07-13 DIAGNOSIS — Z9889 Other specified postprocedural states: Secondary | ICD-10-CM

## 2016-07-13 DIAGNOSIS — M2011 Hallux valgus (acquired), right foot: Secondary | ICD-10-CM

## 2016-07-13 DIAGNOSIS — C4492 Squamous cell carcinoma of skin, unspecified: Secondary | ICD-10-CM

## 2016-07-13 HISTORY — DX: Squamous cell carcinoma of skin, unspecified: C44.92

## 2016-07-13 MED ORDER — HYDROCODONE-ACETAMINOPHEN 5-325 MG PO TABS: 1.0000 | ORAL_TABLET | Freq: Four times a day (QID) | ORAL | 0 refills | Status: DC | PRN
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
## 2016-07-14 ENCOUNTER — Encounter: Payer: Self-pay | Admitting: Pulmonary Disease

## 2016-07-14 ENCOUNTER — Ambulatory Visit (INDEPENDENT_AMBULATORY_CARE_PROVIDER_SITE_OTHER): Payer: Medicare Other | Admitting: Pulmonary Disease

## 2016-07-14 ENCOUNTER — Ambulatory Visit: Payer: Medicare Other | Admitting: Pulmonary Disease

## 2016-07-14 VITALS — BP 122/78 | HR 86 | Ht 74.0 in | Wt 197.0 lb

## 2016-07-14 DIAGNOSIS — Z720 Tobacco use: Secondary | ICD-10-CM

## 2016-07-14 DIAGNOSIS — I25119 Atherosclerotic heart disease of native coronary artery with unspecified angina pectoris: Secondary | ICD-10-CM | POA: Diagnosis not present

## 2016-07-14 DIAGNOSIS — J449 Chronic obstructive pulmonary disease, unspecified: Secondary | ICD-10-CM

## 2016-07-14 LAB — PULMONARY FUNCTION TEST
DL/VA % pred: 88 %
DL/VA: 4.23 ml/min/mmHg/L
DLCO UNC: 22.31 ml/min/mmHg
DLCO unc % pred: 61 %
FEF 25-75 Post: 2.55 L/sec
FEF 25-75 Pre: 2.15 L/sec
FEF2575-%Change-Post: 18 %
FEF2575-%PRED-PRE: 63 %
FEF2575-%Pred-Post: 75 %
FEV1-%CHANGE-POST: 4 %
FEV1-%PRED-PRE: 76 %
FEV1-%Pred-Post: 79 %
FEV1-PRE: 2.74 L
FEV1-Post: 2.86 L
FEV1FVC-%Change-Post: 1 %
FEV1FVC-%PRED-PRE: 95 %
FEV6-%CHANGE-POST: 3 %
FEV6-%Pred-Post: 84 %
FEV6-%Pred-Pre: 81 %
FEV6-PRE: 3.61 L
FEV6-Post: 3.72 L
FEV6FVC-%CHANGE-POST: 0 %
FEV6FVC-%PRED-POST: 102 %
FEV6FVC-%Pred-Pre: 102 %
FVC-%Change-Post: 3 %
FVC-%PRED-PRE: 79 %
FVC-%Pred-Post: 82 %
FVC-POST: 3.75 L
FVC-Pre: 3.63 L
POST FEV1/FVC RATIO: 76 %
POST FEV6/FVC RATIO: 99 %
PRE FEV6/FVC RATIO: 99 %
Pre FEV1/FVC ratio: 75 %
RV % pred: 109 %
RV: 2.54 L
TLC % PRED: 79 %
TLC: 6.07 L

## 2016-07-14 NOTE — Patient Instructions (Signed)
Continue using your advair We will check FENO  Follow up in 3 months

## 2016-07-14 NOTE — Progress Notes (Signed)
PFT done today. 

## 2016-07-14 NOTE — Progress Notes (Signed)
[name]    [id number]    06/06/1960  Primary Care Physician:Enobong, Jarold Song, MD  Referring Physician: Arnoldo Morale, MD [place], [place] [address]  Chief complaint:   Follow up for asthma, bronchiectasis.  HPI: 56 year old with asthma, DVT, PE. Admitted in feb 2018 with asthma exacerbation, bronchitis. Last seen in pulm clinic in 2014. He feels better since admission but still with mild wheeze.   Pets: Occupation: Exposures: Smoking history:  Outpatient Encounter Prescriptions as of 07/14/2016  Medication Sig  . albuterol (PROVENTIL) (5 MG/ML) 0.5% nebulizer solution Take 0.5 mLs (2.5 mg total) by nebulization every 6 (six) hours as needed for wheezing or shortness of breath.  Marland Kitchen atorvastatin (LIPITOR) 40 MG tablet Take 1 tablet (40 mg total) by mouth daily.  Marland Kitchen ELIQUIS 2.5 MG TABS tablet Take 2 tablets (5 mg total) by mouth 2 (two) times daily.  . fluticasone (FLONASE) 50 MCG/ACT nasal spray Place 2 sprays into both nostrils 2 (two) times daily.  . Fluticasone-Salmeterol (ADVAIR DISKUS) 250-50 MCG/DOSE AEPB Inhale 1 puff into the lungs 2 (two) times daily.  Marland Kitchen HYDROcodone-acetaminophen (NORCO/VICODIN) 5-325 MG tablet Take 1-2 tablets by mouth every 6 (six) hours as needed for moderate pain.  . magnesium oxide (MAG-OX) 400 (241.3 Mg) MG tablet Take 1 tablet (400 mg total) by mouth daily.  . pantoprazole (PROTONIX) 40 MG tablet Take 1 tablet (40 mg total) by mouth daily.  . VENTOLIN HFA 108 (90 Base) MCG/ACT inhaler Inhale 2 puffs into the lungs every 6 (six) hours as needed for wheezing or shortness of breath.  . [DISCONTINUED] predniSONE (DELTASONE) 10 MG tablet 6 tablets 4 days, 5 tablets 4 days, 4 tablets 4 days, 3 tablets 4 days, 2 tablets 4 days, 1 tablet 4 days, then discontinue   No facility-administered encounter medications on file as of 07/14/2016.     Allergies as of 07/14/2016 - Review Complete 07/14/2016  Allergen Reaction Noted    . No known allergies  05/09/2016    Past Medical History:  Diagnosis Date  . Asthma   . CAD (coronary artery disease)    a. 09/2013 NSTEMI/PCI: LM nl, LAD 40-50%, D1 100 (2.25 x 28 Vision BMS), LCX min irregs, RI 60-70, 30, RCA 40-50/50-15ms/p, EF 45-50%.  b. cath 12/2014 -occulded BMS in diag, 50% pro to mid LAD, 60% ramus, 30% RCA   . Chest pain 08/2015  . Chronic diastolic CHF (congestive heart failure) ([place])    a. 09/2014 EF 45-50% by LV gram;  b. 01/2014 Echo: EF 55-60%, Gr 1 DD.  Marland Kitchen Cocaine abuse   . COPD (chronic obstructive pulmonary disease) ([place])   . DVT (deep venous thrombosis) ([place])    "I had ~ 10 in each leg"  . Essential hypertension   . Hepatitis C    "clear free over a year now"  . History of pneumonia   . Osteoarthritis    a. hands and toes.  . Pulmonary embolism ([place])    a. 2012 - s/p IVC filter;  b. prev on eliquis - noncompliant.  . Shortness of breath dyspnea   . Sinus headache   . Squamous cell skin cancer 07/13/2016  . Tobacco abuse     Past Surgical History:  Procedure Laterality Date  . CARDIAC CATHETERIZATION N/A 01/04/2015   Procedure: Left Heart Cath and Coronary Angiography;  Surgeon: Burnell Blanks, MD;  Location: [facility];  Service: Cardiovascular;  Laterality: N/A;  .  CORONARY ANGIOPLASTY WITH STENT PLACEMENT  10/21/13   BMS to D1  . CYST EXCISION N/A 05/10/2016   Procedure: EXCISION OF SEBACEOUS CYST UPPER BACK;  Surgeon: Donnie Mesa, MD;  Location: [facility];  Service: General;  Laterality: N/A;  . LEFT HEART CATHETERIZATION WITH CORONARY ANGIOGRAM N/A 10/21/2013   Procedure: LEFT HEART CATHETERIZATION WITH CORONARY ANGIOGRAM;  Surgeon: Leonie Man, MD;  Location: [facility] CATH LAB;  Service: Cardiovascular;  Laterality: N/A;  . VENA CAVA FILTER PLACEMENT  " ~ 2012    Family History  Problem Relation Age of Onset  . Asthma Mother   . Allergies Mother   . Allergies Sister   . Allergies Brother   . Deep vein thrombosis Brother      two brothers with recurrent DVT  . Heart attack Father     a.60s b. deceased in his 103s  . Coronary artery disease Father     Social History   Social History  . Marital status: Single    Spouse name: N/A  . Number of children: N/A  . Years of education: N/A   Occupational History  . unemployed    Social History Main Topics  . Smoking status: Current Every Day Smoker    Packs/day: 0.25    Years: 38.00    Types: Cigarettes  . Smokeless tobacco: Never Used     Comment: trying to quit 3-4 cigarettes per day  . Alcohol use 4.2 oz/week    6 Cans of beer, 1 Shots of liquor per week     Comment: a couple times weekly  . Drug use: No     Comment: a. last use was 8/16 - cocaine  . Sexual activity: Yes   Other Topics Concern  . Not on file   Social History Narrative   Lives in [place].    Review of systems: Review of Systems  Constitutional: Negative for fever and chills.  HENT: Negative.   Eyes: Negative for blurred vision.  Respiratory: as per HPI  Cardiovascular: Negative for chest pain and palpitations.  Gastrointestinal: Negative for vomiting, diarrhea, blood per rectum. Genitourinary: Negative for dysuria, urgency, frequency and hematuria.  Musculoskeletal: Negative for myalgias, back pain and joint pain.  Skin: Negative for itching and rash.  Neurological: Negative for dizziness, tremors, focal weakness, seizures and loss of consciousness.  Endo/Heme/Allergies: Negative for environmental allergies.  Psychiatric/Behavioral: Negative for depression, suicidal ideas and hallucinations.  All other systems reviewed and are negative.  Physical Exam: Blood pressure 122/78, pulse 86, height 6\' 2"  (1.88 m), weight 197 lb (89.4 kg), SpO2 100 %. Gen:      No acute distress HEENT:  EOMI, sclera anicteric Neck:     No masses; no thyromegaly Lungs:    Clear to auscultation bilaterally; normal respiratory effort CV:         Regular rate and rhythm; no murmurs Abd:      +  bowel sounds; soft, non-tender; no palpable masses, no distension Ext:    No edema; adequate peripheral perfusion Skin:      Warm and dry; no rash Neuro: alert and oriented x 3 Psych: normal mood and affect  Data Reviewed: CT scan 11/06/15- No PE, bronchiectasis, bronchitis CXR 05/16/15- No acute process I have reviewed all images personally.  CBC 05/15/16- WBC 4.3, 14% eos. Absolute eos count 600 Blood allergy profile 06/12/16- sensitive to multiple allergens, IgE 235  PFTs 07/14/16 FVC 2.75 (82%) FEV1 2.86 [59%) F/F 76 TLC 79% DLCO 61% DLCO/VA 88%  Assessment:  Moderate persistent asthma Bronchiectasis There is a suspicion for upper airway wheeze on last admission with vocal cord dysfunction. However his CT scans dating back to 2011 shows bronchial inflammation, bronchial wall thickening, plugs and bronchiectasis which is consistent with airway inflammation. Does not appear to be ABPA as IgE levels are not markedly elevated. Eos are elevated on CBC  He continues on advair and albuterol PRN. We will continue the same. We will check FENO at next visit as he had to leave early.  Active smoker Smoking cessation encouraged.  Plan/Recommendations: - Continue advair, albuterol - Check FENO at next visit - Smoking cessation  Marshell Garfinkel MD [facility] Pager [phone] 07/14/2016, 2:14 PM  CC: Arnoldo Morale, MD

## 2016-07-20 ENCOUNTER — Ambulatory Visit: Payer: Medicare Other

## 2016-07-20 ENCOUNTER — Encounter: Payer: Self-pay | Admitting: Podiatry

## 2016-07-20 ENCOUNTER — Ambulatory Visit (INDEPENDENT_AMBULATORY_CARE_PROVIDER_SITE_OTHER): Payer: Self-pay | Admitting: Podiatry

## 2016-07-20 DIAGNOSIS — C4492 Squamous cell carcinoma of skin, unspecified: Secondary | ICD-10-CM

## 2016-07-20 DIAGNOSIS — M2011 Hallux valgus (acquired), right foot: Secondary | ICD-10-CM

## 2016-07-20 DIAGNOSIS — Z9889 Other specified postprocedural states: Secondary | ICD-10-CM

## 2016-07-20 NOTE — Progress Notes (Signed)
Subjective: Miley Lindon is a 56 y.o. is seen today in office s/p right silver bunionectomy and soft tissue mass excision preformed on 06/28/2016. He states he is doing better and his pain is improving. He has remained in the surgical shoe.  Denies any systemic complaints such as fevers, chills, nausea, vomiting. No calf pain, chest pain, shortness of breath.   Objective: General: No acute distress, AAOx3  DP/PT pulses palpable 2/4, CRT < 3 sec to all digits.  Protective sensation intact. Motor function intact.  Right foot: Incision is well coapted without any evidence of dehiscence and sutures intact. There is no surrounding erythema, ascending cellulitis, fluctuance, crepitus, malodor, drainage/purulence. There is decreased edema around the surgical site. There is mild pain along the surgical site. No other areas of tenderness to bilateral lower extremities.  No other open lesions or pre-ulcerative lesions.  No pain with calf compression, swelling, warmth, erythema.   Assessment and Plan:  Status post right foot surgery, doing well with no complications   -Treatment options discussed including all alternatives, risks, and complications -Sutures removed today without complications. Steri-Strips are applied for reassurance as well as a bandage. Instructed to shower tomorrow. Antibiotic ointment and a bandage daily. -Ice/elevation -Pain medication as needed. -Monitor for any clinical signs or symptoms of infection and DVT/PE and directed to call the office immediately should any occur or go to the ER. -Follow-up in 2 weeks or sooner if any problems arise. In the meantime, encouraged to call the office with any questions, concerns, change in symptoms.   Celesta Gentile, DPM

## 2016-08-02 DIAGNOSIS — L089 Local infection of the skin and subcutaneous tissue, unspecified: Secondary | ICD-10-CM | POA: Diagnosis not present

## 2016-08-02 DIAGNOSIS — L738 Other specified follicular disorders: Secondary | ICD-10-CM | POA: Diagnosis not present

## 2016-08-02 DIAGNOSIS — Z85828 Personal history of other malignant neoplasm of skin: Secondary | ICD-10-CM | POA: Diagnosis not present

## 2016-08-02 DIAGNOSIS — L0889 Other specified local infections of the skin and subcutaneous tissue: Secondary | ICD-10-CM | POA: Diagnosis not present

## 2016-08-03 ENCOUNTER — Ambulatory Visit: Payer: Medicare Other | Attending: Family Medicine | Admitting: Family Medicine

## 2016-08-03 ENCOUNTER — Other Ambulatory Visit: Payer: Self-pay | Admitting: Pharmacist

## 2016-08-03 ENCOUNTER — Encounter: Payer: Self-pay | Admitting: Family Medicine

## 2016-08-03 VITALS — BP 103/67 | HR 89 | Temp 98.9°F | Resp 18 | Ht 74.0 in | Wt 190.6 lb

## 2016-08-03 DIAGNOSIS — B1920 Unspecified viral hepatitis C without hepatic coma: Secondary | ICD-10-CM | POA: Insufficient documentation

## 2016-08-03 DIAGNOSIS — L409 Psoriasis, unspecified: Secondary | ICD-10-CM | POA: Insufficient documentation

## 2016-08-03 DIAGNOSIS — I110 Hypertensive heart disease with heart failure: Secondary | ICD-10-CM | POA: Insufficient documentation

## 2016-08-03 DIAGNOSIS — I25119 Atherosclerotic heart disease of native coronary artery with unspecified angina pectoris: Secondary | ICD-10-CM

## 2016-08-03 DIAGNOSIS — Z86711 Personal history of pulmonary embolism: Secondary | ICD-10-CM | POA: Insufficient documentation

## 2016-08-03 DIAGNOSIS — J438 Other emphysema: Secondary | ICD-10-CM | POA: Diagnosis not present

## 2016-08-03 DIAGNOSIS — R5383 Other fatigue: Secondary | ICD-10-CM | POA: Diagnosis not present

## 2016-08-03 DIAGNOSIS — F1410 Cocaine abuse, uncomplicated: Secondary | ICD-10-CM | POA: Insufficient documentation

## 2016-08-03 DIAGNOSIS — D649 Anemia, unspecified: Secondary | ICD-10-CM | POA: Diagnosis not present

## 2016-08-03 DIAGNOSIS — Z85828 Personal history of other malignant neoplasm of skin: Secondary | ICD-10-CM | POA: Diagnosis not present

## 2016-08-03 DIAGNOSIS — I5032 Chronic diastolic (congestive) heart failure: Secondary | ICD-10-CM | POA: Diagnosis not present

## 2016-08-03 DIAGNOSIS — Z8589 Personal history of malignant neoplasm of other organs and systems: Secondary | ICD-10-CM | POA: Insufficient documentation

## 2016-08-03 DIAGNOSIS — J449 Chronic obstructive pulmonary disease, unspecified: Secondary | ICD-10-CM | POA: Diagnosis not present

## 2016-08-03 DIAGNOSIS — M1990 Unspecified osteoarthritis, unspecified site: Secondary | ICD-10-CM | POA: Insufficient documentation

## 2016-08-03 DIAGNOSIS — R000 Tachycardia, unspecified: Secondary | ICD-10-CM | POA: Insufficient documentation

## 2016-08-03 DIAGNOSIS — Z955 Presence of coronary angioplasty implant and graft: Secondary | ICD-10-CM | POA: Diagnosis not present

## 2016-08-03 DIAGNOSIS — J45909 Unspecified asthma, uncomplicated: Secondary | ICD-10-CM | POA: Diagnosis present

## 2016-08-03 DIAGNOSIS — Z86718 Personal history of other venous thrombosis and embolism: Secondary | ICD-10-CM | POA: Diagnosis not present

## 2016-08-03 DIAGNOSIS — Z1211 Encounter for screening for malignant neoplasm of colon: Secondary | ICD-10-CM

## 2016-08-03 DIAGNOSIS — I252 Old myocardial infarction: Secondary | ICD-10-CM | POA: Insufficient documentation

## 2016-08-03 DIAGNOSIS — I2510 Atherosclerotic heart disease of native coronary artery without angina pectoris: Secondary | ICD-10-CM | POA: Diagnosis not present

## 2016-08-03 DIAGNOSIS — Z8701 Personal history of pneumonia (recurrent): Secondary | ICD-10-CM | POA: Diagnosis not present

## 2016-08-03 DIAGNOSIS — Z7901 Long term (current) use of anticoagulants: Secondary | ICD-10-CM | POA: Diagnosis not present

## 2016-08-03 DIAGNOSIS — I214 Non-ST elevation (NSTEMI) myocardial infarction: Secondary | ICD-10-CM

## 2016-08-03 MED ORDER — ACETAMINOPHEN-CODEINE #3 300-30 MG PO TABS: 1.0000 | ORAL_TABLET | Freq: Two times a day (BID) | ORAL | 0 refills | Status: DC | PRN

## 2016-08-03 MED ORDER — PANTOPRAZOLE SODIUM 40 MG PO TBEC: 40.0000 mg | DELAYED_RELEASE_TABLET | Freq: Every day | ORAL | 3 refills | Status: DC

## 2016-08-03 MED ORDER — TRIAMCINOLONE ACETONIDE 0.025 % EX OINT: 1.0000 "application " | TOPICAL_OINTMENT | Freq: Two times a day (BID) | CUTANEOUS | 1 refills | Status: DC

## 2016-08-03 MED ORDER — FLUTICASONE-SALMETEROL 250-50 MCG/DOSE IN AEPB: 1.0000 | INHALATION_SPRAY | Freq: Two times a day (BID) | RESPIRATORY_TRACT | 3 refills | Status: DC

## 2016-08-03 MED ORDER — ALBUTEROL SULFATE (5 MG/ML) 0.5% IN NEBU: 2.5000 mg | INHALATION_SOLUTION | Freq: Four times a day (QID) | RESPIRATORY_TRACT | 3 refills | Status: DC | PRN

## 2016-08-03 MED ORDER — VENTOLIN HFA 108 (90 BASE) MCG/ACT IN AERS
2.0000 | INHALATION_SPRAY | Freq: Four times a day (QID) | RESPIRATORY_TRACT | 3 refills | Status: DC | PRN
Start: 2016-08-03 — End: 2016-09-25

## 2016-08-03 MED ORDER — ALBUTEROL SULFATE (2.5 MG/3ML) 0.083% IN NEBU: 2.5000 mg | INHALATION_SOLUTION | Freq: Four times a day (QID) | RESPIRATORY_TRACT | 1 refills | Status: DC | PRN

## 2016-08-03 MED ORDER — ATORVASTATIN CALCIUM 40 MG PO TABS: 40.0000 mg | ORAL_TABLET | Freq: Every day | ORAL | 3 refills | Status: DC

## 2016-08-03 MED ORDER — ELIQUIS 2.5 MG PO TABS: 5.0000 mg | ORAL_TABLET | Freq: Two times a day (BID) | ORAL | 3 refills | Status: DC

## 2016-08-03 NOTE — Patient Instructions (Signed)
Anemia, Nonspecific Anemia is a condition in which the concentration of red blood cells or hemoglobin in the blood is below normal. Hemoglobin is a substance in red blood cells that carries oxygen to the tissues of the body. Anemia results in not enough oxygen reaching these tissues. What are the causes? Common causes of anemia include:  Excessive bleeding. Bleeding may be internal or external. This includes excessive bleeding from periods (in women) or from the intestine.  Poor nutrition.  Chronic kidney, thyroid, and liver disease.  Bone marrow disorders that decrease red blood cell production.  Cancer and treatments for cancer.  HIV, AIDS, and their treatments.  Spleen problems that increase red blood cell destruction.  Blood disorders.  Excess destruction of red blood cells due to infection, medicines, and autoimmune disorders. What are the signs or symptoms?  Minor weakness.  Dizziness.  Headache.  Palpitations.  Shortness of breath, especially with exercise.  Paleness.  Cold sensitivity.  Indigestion.  Nausea.  Difficulty sleeping.  Difficulty concentrating. Symptoms may occur suddenly or they may develop slowly. How is this diagnosed? Additional blood tests are often needed. These help your health care provider determine the best treatment. Your health care provider will check your stool for blood and look for other causes of blood loss. How is this treated? Treatment varies depending on the cause of the anemia. Treatment can include:  Supplements of iron, vitamin B12, or folic acid.  Hormone medicines.  A blood transfusion. This may be needed if blood loss is severe.  Hospitalization. This may be needed if there is significant continual blood loss.  Dietary changes.  Spleen removal. Follow these instructions at home: Keep all follow-up appointments. It often takes many weeks to correct anemia, and having your health care provider check on your  condition and your response to treatment is very important. Get help right away if:  You develop extreme weakness, shortness of breath, or chest pain.  You become dizzy or have trouble concentrating.  You develop heavy vaginal bleeding.  You develop a rash.  You have bloody or black, tarry stools.  You faint.  You vomit up blood.  You vomit repeatedly.  You have abdominal pain.  You have a fever or persistent symptoms for more than 2-3 days.  You have a fever and your symptoms suddenly get worse.  You are dehydrated. This information is not intended to replace advice given to you by your health care provider. Make sure you discuss any questions you have with your health care provider. Document Released: 04/20/2004 Document Revised: 08/25/2015 Document Reviewed: 09/06/2012 Elsevier Interactive Patient Education  2017 [employer]  

## 2016-08-03 NOTE — Progress Notes (Signed)
Subjective:  Patient ID: [name], male    DOB: May 03, 1960  Age: 56 y.o. MRN: [id number]  CC: Asthma (Follow up) and Tachycardia (Follow up)   HPI [name] is a 56 year old male with a history of COPD, recurrent DVT and PE (on anticoagulation with Eliquis), coronary artery disease (status post stent, last cardiac cath from 12/2014 revealed two-vessel CAD, medical management recommended), tobacco abuse who presents today for a follow-up visit.  He Is status post right bunionectomy and soft tissue mass excision in 06/2016 with findings of squamous cell carcinoma in situ, margins free from the soft tissue mass. He currently follows with dermatology and with podiatry. He complains of pain in his right foot which radiates up to his leg and is requesting a prescription for Vicodin as he ran out of that which he received from his podiatrist.  He complains of cough but no wheezing or shortness of breath and endorses using his Advair only once daily rather than twice daily but he also uses his rescue inhaler.  Complains of fatigue and reduced appetite for the last few weeks; review of his last set of labs revealed anemia with a hemoglobin of 10.7 from 04/2016  2-D echo from 12/2014 reveals EF of 44-03%, grade 1 diastolic dysfunction.   Past Medical History:  Diagnosis Date  . Asthma   . CAD (coronary artery disease)    a. 09/2013 NSTEMI/PCI: LM nl, LAD 40-50%, D1 100 (2.25 x 28 Vision BMS), LCX min irregs, RI 60-70, 30, RCA 40-50/50-62m/p, EF 45-50%.  b. cath 12/2014 -occulded BMS in diag, 50% pro to mid LAD, 60% ramus, 30% RCA   . Chest pain 08/2015  . Chronic diastolic CHF (congestive heart failure) ([facility]    a. 09/2014 EF 45-50% by LV gram;  b. 01/2014 Echo: EF 55-60%, Gr 1 DD.  .Marland KitchenCocaine abuse   . COPD (chronic obstructive pulmonary disease) ([facility]   . DVT (deep venous thrombosis) ([facility]    "I had ~ 10 in each leg"  . Essential hypertension   . Hepatitis C    "clear free over a  year now"  . History of pneumonia   . Osteoarthritis    a. hands and toes.  . Pulmonary embolism ([place]    a. 2012 - s/p IVC filter;  b. prev on eliquis - noncompliant.  . Shortness of breath dyspnea   . Sinus headache   . Squamous cell skin cancer 07/13/2016  . Tobacco abuse     Past Surgical History:  Procedure Laterality Date  . CARDIAC CATHETERIZATION N/A 01/04/2015   Procedure: Left Heart Cath and Coronary Angiography;  Surgeon: CBurnell Blanks MD;  [facility];  Service: Cardiovascular;  Laterality: N/A;  . CORONARY ANGIOPLASTY WITH STENT PLACEMENT  10/21/13   BMS to D1  . CYST EXCISION N/A 05/10/2016   Procedure: EXCISION OF SEBACEOUS CYST UPPER BACK;  Surgeon: MDonnie Mesa MD;  [facility]  Service: General;  Laterality: N/A;  . LEFT HEART CATHETERIZATION WITH CORONARY ANGIOGRAM N/A 10/21/2013   Procedure: LEFT HEART CATHETERIZATION WITH CORONARY ANGIOGRAM;  Surgeon: DLeonie Man MD;  [facility];  Service: Cardiovascular;  Laterality: N/A;  . VENA CAVA FILTER PLACEMENT  " ~ 2012    Allergies  Allergen Reactions  . No Known Allergies      Outpatient Medications Prior to Visit  Medication Sig Dispense Refill  . fluticasone (FLONASE) 50 MCG/ACT nasal spray Place 2 sprays into both nostrils 2 (  two) times daily. 16 g 2  . albuterol (PROVENTIL) (5 MG/ML) 0.5% nebulizer solution Take 0.5 mLs (2.5 mg total) by nebulization every 6 (six) hours as needed for wheezing or shortness of breath. 20 mL 12  . atorvastatin (LIPITOR) 40 MG tablet Take 1 tablet (40 mg total) by mouth daily. 30 tablet 3  . ELIQUIS 2.5 MG TABS tablet Take 2 tablets (5 mg total) by mouth 2 (two) times daily. 60 tablet 0  . Fluticasone-Salmeterol (ADVAIR DISKUS) 250-50 MCG/DOSE AEPB Inhale 1 puff into the lungs 2 (two) times daily. 1 each 3  . HYDROcodone-acetaminophen (NORCO/VICODIN) 5-325 MG tablet Take 1-2 tablets by mouth every 6 (six) hours as needed for moderate  pain. (Patient not taking: Reported on 08/03/2016) 30 tablet 0  . magnesium oxide (MAG-OX) 400 (241.3 Mg) MG tablet Take 1 tablet (400 mg total) by mouth daily. (Patient not taking: Reported on 08/03/2016) 30 tablet 0  . pantoprazole (PROTONIX) 40 MG tablet Take 1 tablet (40 mg total) by mouth daily. (Patient not taking: Reported on 08/03/2016) 30 tablet 0  . VENTOLIN HFA 108 (90 Base) MCG/ACT inhaler Inhale 2 puffs into the lungs every 6 (six) hours as needed for wheezing or shortness of breath. (Patient not taking: Reported on 08/03/2016) 1 Inhaler 3   No facility-administered medications prior to visit.     ROS Review of Systems  Constitutional: Positive for appetite change and fatigue. Negative for activity change.  HENT: Negative for sinus pressure and sore throat.   Eyes: Negative for visual disturbance.  Respiratory: Positive for cough. Negative for chest tightness and shortness of breath.   Cardiovascular: Negative for chest pain and leg swelling.  Gastrointestinal: Negative for abdominal distention, abdominal pain, constipation and diarrhea.  Endocrine: Negative.   Genitourinary: Negative for dysuria.  Musculoskeletal:       See hpi  Skin: Negative for rash.  Allergic/Immunologic: Negative.   Neurological: Negative for weakness, light-headedness and numbness.  Psychiatric/Behavioral: Negative for dysphoric mood and suicidal ideas.    Objective:  BP 103/67 (BP Location: Left Arm, Patient Position: Sitting, Cuff Size: Large)   Pulse 89   Temp 98.9 F (37.2 C) (Oral)   Resp 18   Ht _0  (1.88 m)   Wt 190 lb 9.6 oz (86.5 kg)   SpO2 97%   BMI 24.47 kg/m   BP/Weight 08/03/2016 08/31/3014 0/03/930  Systolic BP 355 732 202  Diastolic BP 67 78 80  Wt. (Lbs) 190.6 197 192.8  BMI 24.47 25.29 24.75      Physical Exam  Constitutional: He is oriented to person, place, and time. He appears well-developed and well-nourished.  Cardiovascular: Normal rate, normal heart sounds and  intact distal pulses.   No murmur heard. Pulmonary/Chest: Effort normal and breath sounds normal. He has no wheezes. He has no rales. He exhibits no tenderness.  Abdominal: Soft. Bowel sounds are normal. He exhibits no distension and no mass. There is no tenderness.  Musculoskeletal:  Right foot in a cam Walker  Neurological: He is alert and oriented to person, place, and time.  Skin: Skin is warm and dry. Rash (Hyperkeratotic, scaly plaque with silvery surface behind both ears) noted.  Psychiatric: He has a normal mood and affect.     CBC    Component Value Date/Time   WBC 3.8 (L) 05/16/2016 0908   RBC 3.51 (L) 05/16/2016 0908   HGB 10.7 (L) 05/16/2016 0908   HCT 32.4 (L) 05/16/2016 0908   HCT 33.6 (L) 05/01/2014 0431  PLT 117 (L) 05/16/2016 0908   MCV 92.3 05/16/2016 0908   MCH 30.5 05/16/2016 0908   MCHC 33.0 05/16/2016 0908   RDW 13.4 05/16/2016 0908   LYMPHSABS 1.6 05/15/2016 2133   MONOABS 0.5 05/15/2016 2133   EOSABS 0.6 05/15/2016 2133   BASOSABS 0.0 05/15/2016 2133    Assessment & Plan:   1. Other emphysema ([place]) Uncontrolled Educated on proper inhaler use - Fluticasone-Salmeterol (ADVAIR DISKUS) 250-50 MCG/DOSE AEPB; Inhale 1 puff into the lungs 2 (two) times daily.  Dispense: 1 each; Refill: 3  2. Other fatigue Will screen for anemia  3. Anemia, unspecified type - CBC with Differential/Platelet  4. NSTEMI (non-ST elevated myocardial infarction) ([place]) Risk factor modification Continue Statin - CMP14+EGFR - Lipid panel  5. Screening for colon cancer - Ambulatory referral to Gastroenterology  6. Psoriasis Placed on triamcinolone  7. Status post right bunionectomy and Squamous cell carcinoma status post excision Placed on Tylenol 3 Keep appointments with podiatry and dermatology  Meds ordered this encounter  Medications  . Fluticasone-Salmeterol (ADVAIR DISKUS) 250-50 MCG/DOSE AEPB    Sig: Inhale 1 puff into the lungs 2 (two) times daily.     Dispense:  1 each    Refill:  3  . VENTOLIN HFA 108 (90 Base) MCG/ACT inhaler    Sig: Inhale 2 puffs into the lungs every 6 (six) hours as needed for wheezing or shortness of breath.    Dispense:  1 Inhaler    Refill:  3  . atorvastatin (LIPITOR) 40 MG tablet    Sig: Take 1 tablet (40 mg total) by mouth daily.    Dispense:  30 tablet    Refill:  3  . albuterol (PROVENTIL) (5 MG/ML) 0.5% nebulizer solution    Sig: Take 0.5 mLs (2.5 mg total) by nebulization every 6 (six) hours as needed for wheezing or shortness of breath.    Dispense:  20 mL    Refill:  3  . pantoprazole (PROTONIX) 40 MG tablet    Sig: Take 1 tablet (40 mg total) by mouth daily.    Dispense:  30 tablet    Refill:  3  . acetaminophen-codeine (TYLENOL #3) 300-30 MG tablet    Sig: Take 1 tablet by mouth every 12 (twelve) hours as needed for moderate pain.    Dispense:  40 tablet    Refill:  0  . ELIQUIS 2.5 MG TABS tablet    Sig: Take 2 tablets (5 mg total) by mouth 2 (two) times daily.    Dispense:  60 tablet    Refill:  3    Follow-up: Return in about 3 months (around 11/03/2016) for Follow-up of chronic medical conditions.   Arnoldo Morale MD

## 2016-08-04 LAB — LIPID PANEL
CHOLESTEROL TOTAL: 206 mg/dL — AB (ref 100–199)
Chol/HDL Ratio: 3.6 ratio (ref 0.0–5.0)
HDL: 57 mg/dL (ref >39)
LDL CALC: 129 mg/dL — AB (ref 0–99)
TRIGLYCERIDES: 98 mg/dL (ref 0–149)
VLDL Cholesterol Cal: 20 mg/dL (ref 5–40)

## 2016-08-04 LAB — CBC WITH DIFFERENTIAL/PLATELET
BASOS ABS: 0 10*3/uL (ref 0.0–0.2)
Basos: 0 %
EOS (ABSOLUTE): 0.3 10*3/uL (ref 0.0–0.4)
Eos: 5 %
HEMOGLOBIN: 14.6 g/dL (ref 13.0–17.7)
Hematocrit: 45.7 % (ref 37.5–51.0)
Immature Grans (Abs): 0 10*3/uL (ref 0.0–0.1)
Immature Granulocytes: 0 %
LYMPHS ABS: 1.4 10*3/uL (ref 0.7–3.1)
LYMPHS: 27 %
MCH: 30.7 pg (ref 26.6–33.0)
MCHC: 31.9 g/dL (ref 31.5–35.7)
MCV: 96 fL (ref 79–97)
MONOCYTES: 13 %
Monocytes Absolute: 0.6 10*3/uL (ref 0.1–0.9)
Neutrophils Absolute: 2.8 10*3/uL (ref 1.4–7.0)
Neutrophils: 55 %
Platelets: 172 10*3/uL (ref 150–379)
RBC: 4.75 x10E6/uL (ref 4.14–5.80)
RDW: 15.7 % — ABNORMAL HIGH (ref 12.3–15.4)
WBC: 5 10*3/uL (ref 3.4–10.8)

## 2016-08-04 LAB — CMP14+EGFR
A/G RATIO: 1.4 (ref 1.2–2.2)
ALK PHOS: 124 IU/L — AB (ref 39–117)
ALT: 35 IU/L (ref 0–44)
AST: 44 IU/L — ABNORMAL HIGH (ref 0–40)
Albumin: 4.2 g/dL (ref 3.5–5.5)
BILIRUBIN TOTAL: 0.3 mg/dL (ref 0.0–1.2)
BUN / CREAT RATIO: 14 (ref 9–20)
BUN: 12 mg/dL (ref 6–24)
CHLORIDE: 101 mmol/L (ref 96–106)
CO2: 23 mmol/L (ref 18–29)
Calcium: 9.5 mg/dL (ref 8.7–10.2)
Creatinine, Ser: 0.87 mg/dL (ref 0.76–1.27)
GFR calc Af Amer: 112 mL/min/{1.73_m2} (ref >59)
GFR calc non Af Amer: 96 mL/min/{1.73_m2} (ref >59)
GLUCOSE: 106 mg/dL — AB (ref 65–99)
Globulin, Total: 2.9 g/dL (ref 1.5–4.5)
POTASSIUM: 4.6 mmol/L (ref 3.5–5.2)
Sodium: 142 mmol/L (ref 134–144)
Total Protein: 7.1 g/dL (ref 6.0–8.5)

## 2016-08-08 ENCOUNTER — Telehealth: Payer: Self-pay | Admitting: *Deleted

## 2016-08-08 NOTE — Telephone Encounter (Signed)
-----   Message from Arnoldo Morale, MD sent at 08/04/2016 12:26 PM EDT ----- His cholesterol is elevated compared to one year ago. Please advised to comply with atorvastatin, low cholesterol diet. His liver enzyme is slightly elevated; we will monitor this.

## 2016-08-08 NOTE — Telephone Encounter (Signed)
Patient verified DOB Patient is aware of cholesterol being elevated and needing to comply with Atorvastatin. Patient is also aware of liver enzymes being slightly elevated and this result being monitored. Patient expressed his understanding and had no further questions at this time.

## 2016-08-10 ENCOUNTER — Encounter: Payer: Self-pay | Admitting: Podiatry

## 2016-08-10 ENCOUNTER — Ambulatory Visit: Payer: Medicare Other

## 2016-08-10 ENCOUNTER — Ambulatory Visit (INDEPENDENT_AMBULATORY_CARE_PROVIDER_SITE_OTHER): Payer: Self-pay | Admitting: Podiatry

## 2016-08-10 DIAGNOSIS — M2011 Hallux valgus (acquired), right foot: Secondary | ICD-10-CM

## 2016-08-10 DIAGNOSIS — Z9889 Other specified postprocedural states: Secondary | ICD-10-CM

## 2016-08-10 MED ORDER — HYDROCODONE-ACETAMINOPHEN 5-325 MG PO TABS: 1.0000 | ORAL_TABLET | Freq: Four times a day (QID) | ORAL | 0 refills | Status: DC | PRN

## 2016-08-13 NOTE — Progress Notes (Signed)
Subjective: Ruby Dilone is a 56 y.o. is seen today in office s/p right silver bunionectomy and soft tissue mass excision preformed on 06/28/2016. He states he is doing better and his pain is improving. However since last appointment he didn't follow with dermatology and he states they took a sample from around the incision and he received a phone call stating that it was "not infected". I does not have concerns about infection but did send dermatology his pathology report which revealed squamous cell carcinoma. He states that since dermatology has removed in pieces skin he has had discomfort to the surgical site. He has remained in the surgical shoe.  Denies any systemic complaints such as fevers, chills, nausea, vomiting. No calf pain, chest pain, shortness of breath.   Objective: General: No acute distress, AAOx3  DP/PT pulses palpable 2/4, CRT < 3 sec to all digits.  Protective sensation intact. Motor function intact.  Right foot: Incision is well coapted without any evidence of dehiscence and a scar is formed. This hyperkeratotic tissue along the incision after debridement the underlying wound appears to be healed except for a small area that he states the biopsy was performed. This is a granular wound base and approximated 3 mm. There is no swelling erythema, ascending cellulitis. There is no fluctuance, crepitus, malodor. There is mental tenderness along the biopsy site but no other areas of tenderness. There is mild edema on surgical site. No other open lesions or pre-ulcerative lesions.  No pain with calf compression, swelling, warmth, erythema.   Assessment and Plan:  Status post right foot surgery, doing well with no complications   -Treatment options discussed including all alternatives, risks, and complications -I debridement hyperkeratotic tissue on the incision appears to be healing well. I recommended him to apply antibiotic ointment along the incision daily and once the area is  well-healed he switched to moisturizer. I did make an offloading pad for the bunion site as well when he can return to regular she was able. -Ice/elevation -Pain medication as needed- Refilled today -Monitor for any clinical signs or symptoms of infection and DVT/PE and directed to call the office immediately should any occur or go to the ER. -Follow-up in 2 weeks or sooner if any problems arise. In the meantime, encouraged to call the office with any questions, concerns, change in symptoms.   Celesta Gentile, DPM

## 2016-08-17 ENCOUNTER — Other Ambulatory Visit: Payer: Self-pay | Admitting: Family Medicine

## 2016-08-17 MED ORDER — APIXABAN 5 MG PO TABS: 5.0000 mg | ORAL_TABLET | Freq: Two times a day (BID) | ORAL | 3 refills | Status: DC

## 2016-08-22 ENCOUNTER — Other Ambulatory Visit: Payer: Self-pay | Admitting: Gastroenterology

## 2016-08-22 DIAGNOSIS — R7989 Other specified abnormal findings of blood chemistry: Secondary | ICD-10-CM

## 2016-08-22 DIAGNOSIS — R945 Abnormal results of liver function studies: Secondary | ICD-10-CM

## 2016-08-25 ENCOUNTER — Encounter: Payer: Self-pay | Admitting: Podiatry

## 2016-08-25 ENCOUNTER — Ambulatory Visit (INDEPENDENT_AMBULATORY_CARE_PROVIDER_SITE_OTHER): Payer: Medicare Other

## 2016-08-25 ENCOUNTER — Ambulatory Visit (INDEPENDENT_AMBULATORY_CARE_PROVIDER_SITE_OTHER): Payer: Self-pay | Admitting: Podiatry

## 2016-08-25 DIAGNOSIS — Z9889 Other specified postprocedural states: Secondary | ICD-10-CM

## 2016-08-25 DIAGNOSIS — C4492 Squamous cell carcinoma of skin, unspecified: Secondary | ICD-10-CM

## 2016-08-25 DIAGNOSIS — M2011 Hallux valgus (acquired), right foot: Secondary | ICD-10-CM

## 2016-08-25 MED ORDER — HYDROCODONE-ACETAMINOPHEN 5-325 MG PO TABS: 1.0000 | ORAL_TABLET | Freq: Four times a day (QID) | ORAL | 0 refills | Status: DC | PRN

## 2016-08-25 NOTE — Progress Notes (Signed)
Subjective: [name] is a 56 y.o. is seen today in office s/p right silver bunionectomy and soft tissue mass excision preformed on 06/28/2016. He states the areas doing better be so has one sore spot for biopsy was obtained. He said at some swelling to the swelling has improved. He is also asking for refill pain medicine is still uncomfortable wear shoes. He denies any redness or warmth. He has no other concerns today. Denies any systemic complaints such as fevers, chills, nausea, vomiting. No calf pain, chest pain, shortness of breath.   Objective: General: No acute distress, AAOx3  DP/PT pulses palpable 2/4, CRT < 3 sec to all digits.  Protective sensation intact. Motor function intact.  Right foot: Incision is well coapted without any evidence of dehiscence and a scar is formed. Hyperkeratotic tissue on the midsubstance of the incision. Upon debridement is only superficial skin breakdown. There is no probing, undermining tunneling. There is no swelling erythema, ascending saline status or drainage or pus. Mild symptoms of the surgical site. Mild tenderness directly on surgical site. No other areas of tenderness identified at this time. No other open lesions or pre-ulcerative lesions.  No pain with calf compression, swelling, warmth, erythema.   Assessment and Plan:  Status post right foot surgery, doing well with no complications   -Treatment options discussed including all alternatives, risks, and complications -I debridement hyperkeratotic tissue on the incision appears to be healing well. With superficial skin breakdown is present this as long what he states the biopsy site from dermatology. Antibiotic was applied. Continue this daily. Offloading pads were dispensed. Continue daily dressing changes. Prescribed Vicodin for pain over this with last prescription. -Monitor for any clinical signs or symptoms of infection and DVT/PE and directed to call the office immediately should any occur or  go to the ER.  -Follow-up in 2 weeks or sooner if any problems arise. In the meantime, encouraged to call the office with any questions, concerns, change in symptoms.   Celesta Gentile, DPM

## 2016-08-25 NOTE — Progress Notes (Signed)
DOS [phone] Right foot removal of soft tissue mass (callus): Right foot bunion repair (Shaving of bone- possible cutting/ reposition, of bone: [place] Bunionectomy)

## 2016-08-30 ENCOUNTER — Ambulatory Visit
Admission: RE | Admit: 2016-08-30 | Discharge: 2016-08-30 | Disposition: A | Discharge status: Home / Self Care | Payer: Medicare Other | Source: Ambulatory Visit | Attending: Gastroenterology | Admitting: Gastroenterology

## 2016-08-30 DIAGNOSIS — R7989 Other specified abnormal findings of blood chemistry: Secondary | ICD-10-CM

## 2016-08-30 DIAGNOSIS — R945 Abnormal results of liver function studies: Secondary | ICD-10-CM | POA: Diagnosis not present

## 2016-09-08 ENCOUNTER — Ambulatory Visit (INDEPENDENT_AMBULATORY_CARE_PROVIDER_SITE_OTHER): Payer: Medicare Other | Admitting: Podiatry

## 2016-09-08 ENCOUNTER — Encounter: Payer: Self-pay | Admitting: Podiatry

## 2016-09-08 DIAGNOSIS — M2011 Hallux valgus (acquired), right foot: Secondary | ICD-10-CM

## 2016-09-08 DIAGNOSIS — C4492 Squamous cell carcinoma of skin, unspecified: Secondary | ICD-10-CM

## 2016-09-08 MED ORDER — HYDROCODONE-ACETAMINOPHEN 5-325 MG PO TABS: 1.0000 | ORAL_TABLET | Freq: Three times a day (TID) | ORAL | 0 refills | Status: DC | PRN

## 2016-09-11 NOTE — Progress Notes (Signed)
Subjective: [name] is a 56 y.o. is seen today in office s/p right silver bunionectomy and soft tissue mass excision preformed on 06/28/2016. He states he still has some swelling and numbness on the surgical site but this is slowly improving. He is asking for refill his pain medication however because he is on his feet when regular shoe does get discomfort to the area. There is one spot on the incision is still painful and this is where apparently a biopsy was obtained from dermatology. Denies any drainage or pus. Denies any systemic complaints such as fevers, chills, nausea, vomiting. No calf pain, chest pain, shortness of breath.   Objective: General: No acute distress, AAOx3  DP/PT pulses palpable 2/4, CRT < 3 sec to all digits.  Protective sensation intact. Motor function intact.  Right foot: Incision is well coapted without any evidence of dehiscence and a scar is formed. However along the central aspect is a very small area of superficial granulation tissue present. There is no pain in this area there is no surrounding redness or drainage. There is some mild edema which does continue there is no erythema or any ascending synovitis. There is no drainage or pus expressed. No fluctuance or crepitus. No malodor. No crackles signs of infection.No other open lesions or pre-ulcerative lesions.  No pain with calf compression, swelling, warmth, erythema.   Assessment and Plan:  Status post right foot surgery, with some residual pain  -Treatment options discussed including all alternatives, risks, and complications -Recommend a small amount of antiemetic ointment and a bandage daily along the small area of superficial skin breakdown along the incision. Microsagittal symptoms of infection. Offloading pads were dispensed. Continue with supportive shoe gear. I did prescribe 1 more refill of Vicodin. Hopefully we will not refill this. -RTC as scheduled or sooner if needed.     Celesta Gentile,  DPM

## 2016-09-22 ENCOUNTER — Ambulatory Visit: Payer: Medicare Other | Admitting: Podiatry

## 2016-09-25 ENCOUNTER — Other Ambulatory Visit: Payer: Self-pay | Admitting: Pharmacist

## 2016-09-25 DIAGNOSIS — J438 Other emphysema: Secondary | ICD-10-CM

## 2016-09-25 MED ORDER — VENTOLIN HFA 108 (90 BASE) MCG/ACT IN AERS: 2.0000 | INHALATION_SPRAY | Freq: Four times a day (QID) | RESPIRATORY_TRACT | 1 refills | Status: DC | PRN

## 2016-11-08 ENCOUNTER — Ambulatory Visit: Payer: Medicare Other | Admitting: Family Medicine

## 2016-12-18 ENCOUNTER — Emergency Department (HOSPITAL_COMMUNITY): Payer: Medicare Other

## 2016-12-18 ENCOUNTER — Emergency Department (HOSPITAL_COMMUNITY)
Admission: EM | Admit: 2016-12-18 | Discharge: 2016-12-18 | Disposition: A | Discharge status: Home / Self Care | Payer: Medicare Other | Attending: Emergency Medicine | Admitting: Emergency Medicine

## 2016-12-18 ENCOUNTER — Encounter (HOSPITAL_COMMUNITY): Payer: Self-pay | Admitting: *Deleted

## 2016-12-18 DIAGNOSIS — I110 Hypertensive heart disease with heart failure: Secondary | ICD-10-CM | POA: Insufficient documentation

## 2016-12-18 DIAGNOSIS — Z79899 Other long term (current) drug therapy: Secondary | ICD-10-CM | POA: Insufficient documentation

## 2016-12-18 DIAGNOSIS — Z7901 Long term (current) use of anticoagulants: Secondary | ICD-10-CM | POA: Insufficient documentation

## 2016-12-18 DIAGNOSIS — F17210 Nicotine dependence, cigarettes, uncomplicated: Secondary | ICD-10-CM | POA: Insufficient documentation

## 2016-12-18 DIAGNOSIS — Y929 Unspecified place or not applicable: Secondary | ICD-10-CM | POA: Diagnosis not present

## 2016-12-18 DIAGNOSIS — J441 Chronic obstructive pulmonary disease with (acute) exacerbation: Secondary | ICD-10-CM | POA: Insufficient documentation

## 2016-12-18 DIAGNOSIS — Y93K9 Activity, other involving animal care: Secondary | ICD-10-CM | POA: Diagnosis not present

## 2016-12-18 DIAGNOSIS — I2510 Atherosclerotic heart disease of native coronary artery without angina pectoris: Secondary | ICD-10-CM | POA: Insufficient documentation

## 2016-12-18 DIAGNOSIS — I252 Old myocardial infarction: Secondary | ICD-10-CM | POA: Insufficient documentation

## 2016-12-18 DIAGNOSIS — C4492 Squamous cell carcinoma of skin, unspecified: Secondary | ICD-10-CM | POA: Insufficient documentation

## 2016-12-18 DIAGNOSIS — R079 Chest pain, unspecified: Secondary | ICD-10-CM | POA: Diagnosis not present

## 2016-12-18 DIAGNOSIS — J45901 Unspecified asthma with (acute) exacerbation: Secondary | ICD-10-CM

## 2016-12-18 DIAGNOSIS — Y998 Other external cause status: Secondary | ICD-10-CM | POA: Diagnosis not present

## 2016-12-18 DIAGNOSIS — W01198A Fall on same level from slipping, tripping and stumbling with subsequent striking against other object, initial encounter: Secondary | ICD-10-CM | POA: Diagnosis not present

## 2016-12-18 DIAGNOSIS — R0789 Other chest pain: Secondary | ICD-10-CM | POA: Diagnosis not present

## 2016-12-18 DIAGNOSIS — R061 Stridor: Secondary | ICD-10-CM | POA: Diagnosis not present

## 2016-12-18 DIAGNOSIS — I5032 Chronic diastolic (congestive) heart failure: Secondary | ICD-10-CM | POA: Insufficient documentation

## 2016-12-18 DIAGNOSIS — R0602 Shortness of breath: Secondary | ICD-10-CM | POA: Diagnosis not present

## 2016-12-18 DIAGNOSIS — R05 Cough: Secondary | ICD-10-CM | POA: Diagnosis not present

## 2016-12-18 LAB — BASIC METABOLIC PANEL
Anion gap: 6 (ref 5–15)
BUN: 8 mg/dL (ref 6–20)
CO2: 25 mmol/L (ref 22–32)
Calcium: 9.4 mg/dL (ref 8.9–10.3)
Chloride: 107 mmol/L (ref 101–111)
Creatinine, Ser: 0.95 mg/dL (ref 0.61–1.24)
GFR calc Af Amer: >60 mL/min (ref >60)
GFR calc non Af Amer: >60 mL/min (ref >60)
Glucose, Bld: 110 mg/dL — ABNORMAL HIGH (ref 65–99)
Potassium: 4.4 mmol/L (ref 3.5–5.1)
Sodium: 138 mmol/L (ref 135–145)

## 2016-12-18 LAB — CBC
HCT: 42 % (ref 39.0–52.0)
Hemoglobin: 13.8 g/dL (ref 13.0–17.0)
MCH: 31.3 pg (ref 26.0–34.0)
MCHC: 32.9 g/dL (ref 30.0–36.0)
MCV: 95.2 fL (ref 78.0–100.0)
Platelets: 147 10*3/uL — ABNORMAL LOW (ref 150–400)
RBC: 4.41 MIL/uL (ref 4.22–5.81)
RDW: 15.2 % (ref 11.5–15.5)
WBC: 4.4 10*3/uL (ref 4.0–10.5)

## 2016-12-18 LAB — I-STAT TROPONIN, ED: Troponin i, poc: 0 ng/mL (ref 0.00–0.08)

## 2016-12-18 MED ORDER — SODIUM CHLORIDE 0.9 % IV SOLN
INTRAVENOUS | Status: DC
  Administered 2016-12-18: 13:00:00 via INTRAVENOUS

## 2016-12-18 MED ORDER — HYDROCODONE-ACETAMINOPHEN 5-325 MG PO TABS: 1.0000 | ORAL_TABLET | Freq: Three times a day (TID) | ORAL | 0 refills | Status: DC | PRN

## 2016-12-18 MED ORDER — IPRATROPIUM-ALBUTEROL 0.5-2.5 (3) MG/3ML IN SOLN
3.0000 mL | Freq: Once | RESPIRATORY_TRACT | Status: AC
  Administered 2016-12-18: 3 mL via RESPIRATORY_TRACT
  Filled 2016-12-18: qty 3

## 2016-12-18 MED ORDER — PREDNISONE 20 MG PO TABS
60.0000 mg | ORAL_TABLET | Freq: Once | ORAL | Status: AC
  Administered 2016-12-18: 60 mg via ORAL
  Filled 2016-12-18: qty 3

## 2016-12-18 MED ORDER — HYDROMORPHONE HCL 1 MG/ML IJ SOLN
1.0000 mg | Freq: Once | INTRAMUSCULAR | Status: AC
  Administered 2016-12-18: 1 mg via INTRAVENOUS
  Filled 2016-12-18: qty 1

## 2016-12-18 MED ORDER — KETOROLAC TROMETHAMINE 15 MG/ML IJ SOLN
15.0000 mg | Freq: Once | INTRAMUSCULAR | Status: AC
  Administered 2016-12-18: 15 mg via INTRAVENOUS
  Filled 2016-12-18: qty 1

## 2016-12-18 MED ORDER — PREDNISONE 20 MG PO TABS: 40.0000 mg | ORAL_TABLET | Freq: Every day | ORAL | 0 refills | Status: DC

## 2016-12-18 NOTE — ED Notes (Signed)
Got patient undress on the monitor did ekg shown to Dr Wilson Singer patient is resting with call bell in reach

## 2016-12-18 NOTE — ED Provider Notes (Signed)
[facility] DEPT Provider Note   CSN: [id number] Arrival date & time: 12/18/16  1102     History   Chief Complaint Chief Complaint  Patient presents with  . Shortness of Breath  . Fall    HPI [name] is a 56 y.o. male.  HPI   56 year old male with right-sided chest pain and shortness of breath. He states that his large dog got tangled in his feet on Wednesday he fell onto a cement surface hurting his chest and his right wrist. He said persistent right chest pain since then. He had some abrasions to his wrist but the pain is beginning getting better. On Thursday he began having a cough and some shortness of breath. Wheezing. Cough is nonproductive. No fevers or chills. No unusual leg pain or swelling. Is a past history of multiple DVT/PE. He is on L [place]. He reports compliance with his medications.   Past Medical History:  Diagnosis Date  . Asthma   . CAD (coronary artery disease)    a. 09/2013 NSTEMI/PCI: LM nl, LAD 40-50%, D1 100 (2.25 x 28 Vision BMS), LCX min irregs, RI 60-70, 30, RCA 40-50/50-30ms/p, EF 45-50%.  b. cath 12/2014 -occulded BMS in diag, 50% pro to mid LAD, 60% ramus, 30% RCA   . Chest pain 08/2015  . Chronic diastolic CHF (congestive heart failure) ([place])    a. 09/2014 EF 45-50% by LV gram;  b. 01/2014 Echo: EF 55-60%, Gr 1 DD.  Marland Kitchen Cocaine abuse   . COPD (chronic obstructive pulmonary disease) ([place])   . DVT (deep venous thrombosis) ([place])    "I had ~ 10 in each leg"  . Essential hypertension   . Hepatitis C    "clear free over a year now"  . History of pneumonia   . Osteoarthritis    a. hands and toes.  . Pulmonary embolism ([place])    a. 2012 - s/p IVC filter;  b. prev on eliquis - noncompliant.  . Shortness of breath dyspnea   . Sinus headache   . Squamous cell skin cancer 07/13/2016  . Tobacco abuse     Patient Active Problem List   Diagnosis Date Noted  . History of squamous cell carcinoma 08/03/2016  . Psoriasis 08/03/2016  . Squamous cell  skin cancer 07/13/2016  . Chronic anticoagulation 02/01/2016  . Malnutrition of moderate degree 06/05/2015  . COPD exacerbation ([place]) 06/03/2015  . Liver fibrosis 04/08/2015  . COPD (chronic obstructive pulmonary disease) ([place]) 01/01/2015  . Essential hypertension 01/01/2015  . Unstable angina ([place]) 01/01/2015  . History of coronary artery disease   . Cocaine abuse 12/01/2014  . Pleuritic chest pain 12/01/2014  . Chronic diastolic heart failure ([place]) 12/01/2014  . QT prolongation 12/01/2014  . Chronic hepatitis C without hepatic coma ([place]) 06/18/2014  . HTN (hypertension) 04/27/2014  . Coronary artery disease involving native coronary artery of native heart without angina pectoris   . CAD (coronary artery disease) 10/21/2013  . NSTEMI (non-ST elevated myocardial infarction) ([place]) 10/21/2013  . Anemia 06/20/2013  . Regular alcohol consumption 06/18/2013  . History of DVT (deep vein thrombosis) 02/21/2013  . Thrombocytopenia ([place]) 08/09/2012  . Tobacco abuse 08/09/2012  . History of noncompliance with medical treatment 03/24/2012  . Atopic dermatitis 03/24/2012  . History of pulmonary embolism 02/10/2012    Past Surgical History:  Procedure Laterality Date  . CARDIAC CATHETERIZATION N/A 01/04/2015   Procedure: Left Heart Cath and Coronary Angiography;  Surgeon: Burnell Blanks, MD;  Location: [facility];  Service: Cardiovascular;  Laterality: N/A;  . CORONARY ANGIOPLASTY WITH STENT PLACEMENT  10/21/13   BMS to D1  . CYST EXCISION N/A 05/10/2016   Procedure: EXCISION OF SEBACEOUS CYST UPPER BACK;  Surgeon: Donnie Mesa, MD;  Location: [facility];  Service: General;  Laterality: N/A;  . LEFT HEART CATHETERIZATION WITH CORONARY ANGIOGRAM N/A 10/21/2013   Procedure: LEFT HEART CATHETERIZATION WITH CORONARY ANGIOGRAM;  Surgeon: Leonie Man, MD;  Location: [facility] CATH LAB;  Service: Cardiovascular;  Laterality: N/A;  . VENA CAVA FILTER PLACEMENT  " ~ 2012       Home  Medications    Prior to Admission medications   Medication Sig Start Date End Date Taking? Authorizing Provider  acetaminophen-codeine (TYLENOL #3) 300-30 MG tablet Take 1 tablet by mouth every 12 (twelve) hours as needed for moderate pain. 08/03/16   Arnoldo Morale, MD  albuterol (PROVENTIL) (2.5 MG/3ML) 0.083% nebulizer solution Take 3 mLs (2.5 mg total) by nebulization every 6 (six) hours as needed for wheezing or shortness of breath. 08/03/16   Arnoldo Morale, MD  apixaban (ELIQUIS) 5 MG TABS tablet Take 1 tablet (5 mg total) by mouth 2 (two) times daily. 08/17/16   Arnoldo Morale, MD  atorvastatin (LIPITOR) 40 MG tablet Take 1 tablet (40 mg total) by mouth daily. 08/03/16   Arnoldo Morale, MD  cephALEXin (KEFLEX) 500 MG capsule Take 1 mg by mouth 3 (three) times daily.  06/28/16   Trula Slade, DPM  doxycycline (VIBRAMYCIN) 100 MG capsule Take 100 mg by mouth 2 (two) times daily. 08/02/16   [provider]  ELIQUIS 2.5 MG TABS tablet  08/03/16   [provider]  fluticasone (FLONASE) 50 MCG/ACT nasal spray Place 2 sprays into both nostrils 2 (two) times daily. 05/20/16   Reyne Dumas, MD  Fluticasone-Salmeterol (ADVAIR DISKUS) 250-50 MCG/DOSE AEPB Inhale 1 puff into the lungs 2 (two) times daily. 08/03/16   Arnoldo Morale, MD  HYDROcodone-acetaminophen (NORCO/VICODIN) 5-325 MG tablet Take 1-2 tablets by mouth every 8 (eight) hours as needed for moderate pain. 09/08/16   Trula Slade, DPM  magnesium oxide (MAG-OX) 400 (241.3 Mg) MG tablet Take 1 tablet (400 mg total) by mouth daily. Patient not taking: Reported on 08/03/2016 05/17/16   Reyne Dumas, MD  mupirocin ointment (BACTROBAN) 2 % Apply 1 application topically as directed. 08/02/16   [provider]  oxyCODONE-acetaminophen (PERCOCET/ROXICET) 5-325 MG tablet Take 1-2 tablets by mouth every 4 (four) hours as needed for severe pain. 06/28/16   Trula Slade, DPM  pantoprazole (PROTONIX) 40 MG tablet Take 1 tablet (40  mg total) by mouth daily. 08/03/16   Arnoldo Morale, MD  promethazine (PHENERGAN) 25 MG tablet Take 1 mg by mouth every 6 (six) hours as needed for nausea or vomiting.  06/28/16   Trula Slade, DPM  triamcinolone (KENALOG) 0.025 % ointment Apply 1 application topically 2 (two) times daily. 08/03/16   Arnoldo Morale, MD  VENTOLIN HFA 108 (90 Base) MCG/ACT inhaler Inhale 2 puffs into the lungs every 6 (six) hours as needed for wheezing or shortness of breath. 09/25/16   Arnoldo Morale, MD    Family History Family History  Problem Relation Age of Onset  . Asthma Mother   . Allergies Mother   . Allergies Sister   . Allergies Brother   . Deep vein thrombosis Brother        two brothers with recurrent DVT  . Heart attack Father  a.60s b. deceased in his 50s  . Coronary artery disease Father     Social History Social History  Substance Use Topics  . Smoking status: Current Every Day Smoker    Packs/day: 0.25    Years: 38.00    Types: Cigarettes  . Smokeless tobacco: Never Used     Comment: trying to quit 3-4 cigarettes per day  . Alcohol use 4.2 oz/week    6 Cans of beer, 1 Shots of liquor per week     Comment: a couple times weekly     Allergies   No known allergies   Review of Systems Review of Systems  All systems reviewed and negative, other than as noted in HPI.   Physical Exam Updated Vital Signs BP (!) 131/94 (BP Location: Right Arm)   Pulse 69   Temp 98.2 F (36.8 C) (Oral)   Resp 20   SpO2 99%   Physical Exam  Constitutional: He appears well-developed and well-nourished. No distress.  HENT:  Head: Normocephalic and atraumatic.  Eyes: Conjunctivae are normal. Right eye exhibits no discharge. Left eye exhibits no discharge.  Neck: Neck supple.  Cardiovascular: Normal rate, regular rhythm and normal heart sounds.  Exam reveals no gallop and no friction rub.   No murmur heard. Pulmonary/Chest: No respiratory distress. He has wheezes.  Mild tachypnea.  Wheezing bilaterally. Symmetric breath sounds.  Abdominal: Soft. He exhibits no distension. There is no tenderness.  Musculoskeletal: He exhibits no edema or tenderness.  Scab superficial abrasions to the volar aspect of the right hand and wrist. Able to actively range without apparent discomfort. No significant point tenderness. Has tenderness along the anterior right chest. No crepitus. No overlying skin changes.   Neurological: He is alert.  Skin: Skin is warm and dry.  Psychiatric: He has a normal mood and affect. His behavior is normal. Thought content normal.  Nursing note and vitals reviewed.    ED Treatments / Results  Labs (all labs ordered are listed, but only abnormal results are displayed) Labs Reviewed  BASIC METABOLIC PANEL - Abnormal; Notable for the following:       Result Value   Glucose, Bld 110 (*)    All other components within normal limits  CBC - Abnormal; Notable for the following:    Platelets 147 (*)    All other components within normal limits  I-STAT TROPONIN, ED    EKG  EKG Interpretation  Date/Time:  Monday December 18 2016 11:06:54 EDT Ventricular Rate:  85 PR Interval:    QRS Duration: 89 QT Interval:  387 QTC Calculation: 461 R Axis:   0 Text Interpretation:  Sinus rhythm Anteroseptal infarct, age indeterminate Confirmed by Virgel Manifold [phone]) on 12/18/2016 3:35:41 PM       Radiology Dg Chest 2 View  Result Date: 12/18/2016 CLINICAL DATA:  Chest pain, shortness of breath, cough, history asthma, RIGHT-side chest pain since a fall on Wednesday, COPD, CHF, smoker EXAM: CHEST  2 VIEW COMPARISON:  05/15/2016 FINDINGS: Normal heart size, mediastinal contours, and pulmonary vascularity. Atherosclerotic calcification aorta. Bronchitic changes without pulmonary infiltrate, pleural effusion or pneumothorax. No definite acute osseous findings. IMPRESSION: Mild chronic bronchitic changes without acute abnormality. Electronically Signed   By: Lavonia Dana M.D.   On: 12/18/2016 12:25    Procedures Procedures (including critical care time)  Medications Ordered in ED Medications  0.9 %  sodium chloride infusion ( Intravenous Stopped 12/18/16 1610)  ipratropium-albuterol (DUONEB) 0.5-2.5 (3) MG/3ML nebulizer solution 3 mL (3  mLs Nebulization Given 12/18/16 1303)  HYDROmorphone (DILAUDID) injection 1 mg (1 mg Intravenous Given 12/18/16 1303)  ketorolac (TORADOL) 15 MG/ML injection 15 mg (15 mg Intravenous Given 12/18/16 1303)  predniSONE (DELTASONE) tablet 60 mg (60 mg Oral Given 12/18/16 1513)     Initial Impression / Assessment and Plan / ED Course  I have reviewed the triage vital signs and the nursing notes.  Pertinent labs & imaging results that were available during my care of the patient were reviewed by me and considered in my medical decision making (see chart for details).     56 year old male with right-sided chest pain after mechanical fall. Likely chest wall contusion. No fracture on nondedicated rib films. Wheezing bilaterally. Continue when necessary rescue inhaler as needed. will give course of steroids. as needed pain medication for his chest.  Final Clinical Impressions(s) / ED Diagnoses   Final diagnoses:  Chest wall pain  Exacerbation of asthma, unspecified asthma severity, unspecified whether persistent    New Prescriptions New Prescriptions   No medications on file     Virgel Manifold, MD 12/18/16 1623

## 2016-12-18 NOTE — ED Notes (Signed)
Patient able to ambulate independently  

## 2016-12-18 NOTE — ED Notes (Signed)
Pt provided with turkey sandwich and water

## 2016-12-18 NOTE — ED Triage Notes (Signed)
Pt arrived via [facility] with sob (asthma and copd).  Pt also fell over a dog at home on Wednesday and hit right rib area pain.  Pt is tender with touch and palpation and hurts to take a deep breath.  Pt is on Eliquis for mulitple DVT and PE.  Pt received Alb 10mg  and Atro 0.5mg  en route to ED.  Pt is talking in complete sentences.

## 2016-12-18 NOTE — ED Notes (Signed)
Patient transported to X-ray 

## 2016-12-20 ENCOUNTER — Emergency Department (HOSPITAL_COMMUNITY): Payer: Medicare Other

## 2016-12-20 ENCOUNTER — Observation Stay (HOSPITAL_COMMUNITY)
Admission: EM | Admit: 2016-12-20 | Discharge: 2016-12-22 | Disposition: A | Discharge status: Home / Self Care | Payer: Medicare Other | Attending: Internal Medicine | Admitting: Internal Medicine

## 2016-12-20 ENCOUNTER — Encounter (HOSPITAL_COMMUNITY): Payer: Self-pay | Admitting: Internal Medicine

## 2016-12-20 DIAGNOSIS — J4551 Severe persistent asthma with (acute) exacerbation: Secondary | ICD-10-CM | POA: Diagnosis not present

## 2016-12-20 DIAGNOSIS — Z23 Encounter for immunization: Secondary | ICD-10-CM | POA: Insufficient documentation

## 2016-12-20 DIAGNOSIS — I2510 Atherosclerotic heart disease of native coronary artery without angina pectoris: Secondary | ICD-10-CM | POA: Insufficient documentation

## 2016-12-20 DIAGNOSIS — J441 Chronic obstructive pulmonary disease with (acute) exacerbation: Principal | ICD-10-CM | POA: Insufficient documentation

## 2016-12-20 DIAGNOSIS — Z7951 Long term (current) use of inhaled steroids: Secondary | ICD-10-CM | POA: Insufficient documentation

## 2016-12-20 DIAGNOSIS — F17210 Nicotine dependence, cigarettes, uncomplicated: Secondary | ICD-10-CM | POA: Insufficient documentation

## 2016-12-20 DIAGNOSIS — Z7901 Long term (current) use of anticoagulants: Secondary | ICD-10-CM | POA: Diagnosis not present

## 2016-12-20 DIAGNOSIS — I252 Old myocardial infarction: Secondary | ICD-10-CM | POA: Insufficient documentation

## 2016-12-20 DIAGNOSIS — Z86718 Personal history of other venous thrombosis and embolism: Secondary | ICD-10-CM | POA: Insufficient documentation

## 2016-12-20 DIAGNOSIS — R0602 Shortness of breath: Secondary | ICD-10-CM | POA: Diagnosis not present

## 2016-12-20 DIAGNOSIS — Z86711 Personal history of pulmonary embolism: Secondary | ICD-10-CM | POA: Insufficient documentation

## 2016-12-20 DIAGNOSIS — Z955 Presence of coronary angioplasty implant and graft: Secondary | ICD-10-CM | POA: Diagnosis not present

## 2016-12-20 DIAGNOSIS — Z79899 Other long term (current) drug therapy: Secondary | ICD-10-CM | POA: Insufficient documentation

## 2016-12-20 DIAGNOSIS — J45909 Unspecified asthma, uncomplicated: Secondary | ICD-10-CM | POA: Diagnosis present

## 2016-12-20 DIAGNOSIS — J438 Other emphysema: Secondary | ICD-10-CM

## 2016-12-20 DIAGNOSIS — R05 Cough: Secondary | ICD-10-CM | POA: Diagnosis not present

## 2016-12-20 HISTORY — DX: Pure hypercholesterolemia, unspecified: E78.00

## 2016-12-20 LAB — CBC WITH DIFFERENTIAL/PLATELET
BASOS PCT: 0 %
Basophils Absolute: 0 10*3/uL (ref 0.0–0.1)
EOS ABS: 0 10*3/uL (ref 0.0–0.7)
Eosinophils Relative: 0 %
HEMATOCRIT: 39.8 % (ref 39.0–52.0)
HEMOGLOBIN: 13 g/dL (ref 13.0–17.0)
LYMPHS ABS: 0.7 10*3/uL (ref 0.7–4.0)
Lymphocytes Relative: 14 %
MCH: 31.1 pg (ref 26.0–34.0)
MCHC: 32.7 g/dL (ref 30.0–36.0)
MCV: 95.2 fL (ref 78.0–100.0)
MONO ABS: 0.1 10*3/uL (ref 0.1–1.0)
MONOS PCT: 2 %
Neutro Abs: 3.8 10*3/uL (ref 1.7–7.7)
Neutrophils Relative %: 84 %
Platelets: 127 10*3/uL — ABNORMAL LOW (ref 150–400)
RBC: 4.18 MIL/uL — ABNORMAL LOW (ref 4.22–5.81)
RDW: 15.3 % (ref 11.5–15.5)
WBC: 4.5 10*3/uL (ref 4.0–10.5)

## 2016-12-20 LAB — I-STAT CHEM 8, ED
BUN: 19 mg/dL (ref 6–20)
CALCIUM ION: 1.08 mmol/L — AB (ref 1.15–1.40)
CHLORIDE: 108 mmol/L (ref 101–111)
CREATININE: 0.8 mg/dL (ref 0.61–1.24)
Glucose, Bld: 260 mg/dL — ABNORMAL HIGH (ref 65–99)
HCT: 42 % (ref 39.0–52.0)
Hemoglobin: 14.3 g/dL (ref 13.0–17.0)
Potassium: 4.3 mmol/L (ref 3.5–5.1)
Sodium: 138 mmol/L (ref 135–145)
TCO2: 22 mmol/L (ref 22–32)

## 2016-12-20 MED ORDER — AZITHROMYCIN 500 MG PO TABS
250.0000 mg | ORAL_TABLET | Freq: Every day | ORAL | Status: DC
  Administered 2016-12-21 – 2016-12-22 (×2): 250 mg via ORAL
  Filled 2016-12-20 (×2): qty 1

## 2016-12-20 MED ORDER — ENSURE ENLIVE PO LIQD
237.0000 mL | Freq: Two times a day (BID) | ORAL | Status: DC
  Administered 2016-12-21 – 2016-12-22 (×3): 237 mL via ORAL

## 2016-12-20 MED ORDER — AZITHROMYCIN 250 MG PO TABS
500.0000 mg | ORAL_TABLET | Freq: Once | ORAL | Status: AC
  Administered 2016-12-20: 500 mg via ORAL
  Filled 2016-12-20: qty 2

## 2016-12-20 MED ORDER — ATORVASTATIN CALCIUM 40 MG PO TABS
40.0000 mg | ORAL_TABLET | Freq: Every day | ORAL | Status: DC
Start: 2016-12-20 — End: 2016-12-20

## 2016-12-20 MED ORDER — APIXABAN 5 MG PO TABS
5.0000 mg | ORAL_TABLET | Freq: Two times a day (BID) | ORAL | Status: DC
  Administered 2016-12-20 – 2016-12-22 (×5): 5 mg via ORAL
  Filled 2016-12-20 (×6): qty 1

## 2016-12-20 MED ORDER — ACETAMINOPHEN 500 MG PO TABS
1000.0000 mg | ORAL_TABLET | Freq: Four times a day (QID) | ORAL | Status: DC | PRN
  Administered 2016-12-20 – 2016-12-21 (×3): 1000 mg via ORAL
  Filled 2016-12-20 (×3): qty 2

## 2016-12-20 MED ORDER — PREDNISONE 20 MG PO TABS
40.0000 mg | ORAL_TABLET | Freq: Every day | ORAL | Status: DC
  Administered 2016-12-21 – 2016-12-22 (×2): 40 mg via ORAL
  Filled 2016-12-20 (×2): qty 2

## 2016-12-20 MED ORDER — PANTOPRAZOLE SODIUM 40 MG PO TBEC
40.0000 mg | DELAYED_RELEASE_TABLET | Freq: Every day | ORAL | Status: DC
Start: 2016-12-20 — End: 2016-12-20

## 2016-12-20 MED ORDER — KETOROLAC TROMETHAMINE 30 MG/ML IJ SOLN
30.0000 mg | Freq: Four times a day (QID) | INTRAMUSCULAR | Status: DC | PRN
  Administered 2016-12-21: 30 mg via INTRAVENOUS
  Filled 2016-12-20 (×4): qty 1

## 2016-12-20 MED ORDER — ALBUTEROL SULFATE (2.5 MG/3ML) 0.083% IN NEBU
5.0000 mg | INHALATION_SOLUTION | Freq: Once | RESPIRATORY_TRACT | Status: AC
  Administered 2016-12-20: 5 mg via RESPIRATORY_TRACT
  Filled 2016-12-20: qty 6

## 2016-12-20 MED ORDER — HYDROCODONE-ACETAMINOPHEN 5-325 MG PO TABS
1.0000 | ORAL_TABLET | Freq: Once | ORAL | Status: AC
  Administered 2016-12-20: 1 via ORAL
  Filled 2016-12-20: qty 1

## 2016-12-20 MED ORDER — FLUTICASONE PROPIONATE 50 MCG/ACT NA SUSP: 2.0000 | Freq: Two times a day (BID) | NASAL | Status: DC

## 2016-12-20 MED ORDER — IPRATROPIUM-ALBUTEROL 0.5-2.5 (3) MG/3ML IN SOLN
3.0000 mL | RESPIRATORY_TRACT | Status: DC | PRN
  Administered 2016-12-21: 3 mL via RESPIRATORY_TRACT
  Filled 2016-12-20: qty 3

## 2016-12-20 MED ORDER — INFLUENZA VAC SPLIT QUAD 0.5 ML IM SUSY
0.5000 mL | PREFILLED_SYRINGE | INTRAMUSCULAR | Status: AC
  Administered 2016-12-21: 0.5 mL via INTRAMUSCULAR
  Filled 2016-12-20: qty 0.5

## 2016-12-20 MED ORDER — IPRATROPIUM-ALBUTEROL 0.5-2.5 (3) MG/3ML IN SOLN
3.0000 mL | RESPIRATORY_TRACT | Status: DC
  Filled 2016-12-20: qty 3

## 2016-12-20 MED ORDER — MOMETASONE FURO-FORMOTEROL FUM 200-5 MCG/ACT IN AERO
2.0000 | INHALATION_SPRAY | Freq: Two times a day (BID) | RESPIRATORY_TRACT | Status: DC
  Administered 2016-12-20 – 2016-12-22 (×4): 2 via RESPIRATORY_TRACT
  Filled 2016-12-20: qty 8.8

## 2016-12-20 MED ORDER — ATORVASTATIN CALCIUM 40 MG PO TABS
40.0000 mg | ORAL_TABLET | Freq: Every day | ORAL | Status: DC
  Administered 2016-12-20 – 2016-12-21 (×2): 40 mg via ORAL
  Filled 2016-12-20 (×2): qty 1

## 2016-12-20 MED ORDER — MAGNESIUM SULFATE 2 GM/50ML IV SOLN
2.0000 g | Freq: Once | INTRAVENOUS | Status: AC
  Administered 2016-12-20: 2 g via INTRAVENOUS
  Filled 2016-12-20: qty 50

## 2016-12-20 MED ORDER — ALBUTEROL (5 MG/ML) CONTINUOUS INHALATION SOLN
10.0000 mg/h | INHALATION_SOLUTION | Freq: Once | RESPIRATORY_TRACT | Status: AC
  Administered 2016-12-20: 10 mg/h via RESPIRATORY_TRACT
  Filled 2016-12-20: qty 20

## 2016-12-20 NOTE — ED Notes (Signed)
Pt ambulated to bathroom without assistance.  Bedding changed.  Meal given.

## 2016-12-20 NOTE — ED Notes (Signed)
Pt continues to be on breathing treatment

## 2016-12-20 NOTE — ED Notes (Signed)
Attempted report.  Will perform bedside report.

## 2016-12-20 NOTE — H&P (Signed)
Date: 12/20/2016               Patient Name:  [name] MRN: [id number]  DOB: [birth year] Age / Sex: 56 y.o., male   PCP: Arnoldo Morale, MD         Medical Service: Internal Medicine Teaching Service         Attending Physician: Dr. Sid Falcon, MD    First Contact: Dr. Berline Lopes Pager: [phone]  Second Contact: Dr. Reesa Chew Pager: [phone]       After Hours (After 5p/  First Contact Pager: [phone]  weekends / holidays): Second Contact Pager: [phone]   Chief Complaint: Shortness of breath, cough  History of Present Illness:  [name] is a 56 year old male with a past medical history significant for emphysema with current tobacco abuse, CAD with prior NSTEMI, squamous cell carcinoma s/p excision, cocaine abuse, prior DVTs on anticoagulation presenting with a 6 day history of shortness of breath and cough. He reports that 7 days ago he was walking his dog, got tangled in the leash and fell on his right side arm/wrist/chest onto a cerement surface. He has had persistent right sided pleuritic chest pain since that time. The following day he began to have shortness of breath, cough with white sputum production, and wheezing. He reports symptomatic relief with his prn albuterol inhaler and nebulizer treatments, however symptoms would return within an hour. He was seen in the ED 2 days ago with similar complaints. He was given nebulizer treatments and steroids with improvement in symptoms and sent home. He reports his symptoms returned once he went home and he ran out of his albuterol inhaler and nebulizer trying to control his shortness of breath and cough and had to come back to the ED for further evaluation. He denies any fevers, chills, sick contacts, leg swelling, recent travel, sinus congestion. Does report worsening cough with purulent sputum production over the past several days. Reports a history of asthma. States he takes Advair daily and albuterol prn. Notes that normally he does  not normally need his albuterol.   In the ED, his vital signs were stable without any hypoxia or increased work of breathing. CXR was negative for any pneumonia or acute process. Labs were unremarkable except for a mild leukocytosis (recieved steroids prior). However, after receiving Solumedrol and several continuous nebulizer treatments he had persistent significant wheezing and remained symptomatic and IMTS was paged for admission.    Meds:  No outpatient prescriptions have been marked as taking for the 12/20/16 encounter [facility]counter).     Allergies: Allergies as of 12/20/2016 - Review Complete 12/20/2016  Allergen Reaction Noted  . No known allergies  05/09/2016   Past Medical History:  Diagnosis Date  . Asthma   . CAD (coronary artery disease)    a. 09/2013 NSTEMI/PCI: LM nl, LAD 40-50%, D1 100 (2.25 x 28 Vision BMS), LCX min irregs, RI 60-70, 30, RCA 40-50/50-63ms/p, EF 45-50%.  b. cath 12/2014 -occulded BMS in diag, 50% pro to mid LAD, 60% ramus, 30% RCA   . Chest pain 08/2015  . Chronic diastolic CHF (congestive heart failure) ([place])    a. 09/2014 EF 45-50% by LV gram;  b. 01/2014 Echo: EF 55-60%, Gr 1 DD.  Marland Kitchen Cocaine abuse   . COPD (chronic obstructive pulmonary disease) ([place])   . DVT (deep venous thrombosis) ([place])    "I had ~ 10 in each leg"  . Essential hypertension   . Hepatitis C    "  clear free over a year now"  . History of pneumonia   . Osteoarthritis    a. hands and toes.  . Pulmonary embolism ([place])    a. 2012 - s/p IVC filter;  b. prev on eliquis - noncompliant.  . Shortness of breath dyspnea   . Sinus headache   . Squamous cell skin cancer 07/13/2016  . Tobacco abuse    Past Surgical History:  Procedure Laterality Date  . CARDIAC CATHETERIZATION N/A 01/04/2015   Procedure: Left Heart Cath and Coronary Angiography;  Surgeon: Burnell Blanks, MD;  Location: [facility];  Service: Cardiovascular;  Laterality: N/A;  . CORONARY ANGIOPLASTY WITH  STENT PLACEMENT  10/21/13   BMS to D1  . CYST EXCISION N/A 05/10/2016   Procedure: EXCISION OF SEBACEOUS CYST UPPER BACK;  Surgeon: Donnie Mesa, MD;  Location: [facility];  Service: General;  Laterality: N/A;  . LEFT HEART CATHETERIZATION WITH CORONARY ANGIOGRAM N/A 10/21/2013   Procedure: LEFT HEART CATHETERIZATION WITH CORONARY ANGIOGRAM;  Surgeon: Leonie Man, MD;  Location: [facility] CATH LAB;  Service: Cardiovascular;  Laterality: N/A;  . VENA CAVA FILTER PLACEMENT  " ~ 2012   Family History:  Family History  Problem Relation Age of Onset  . Asthma Mother   . Allergies Mother   . Allergies Sister   . Allergies Brother   . Deep vein thrombosis Brother        two brothers with recurrent DVT  . Heart attack Father        a.60s b. deceased in his 61s  . Coronary artery disease Father    Social History:  Social History   Social History  . Marital status: Single    Spouse name: N/A  . Number of children: N/A  . Years of education: N/A   Occupational History  . unemployed    Social History Main Topics  . Smoking status: Current Every Day Smoker    Packs/day: 0.25    Years: 38.00    Types: Cigarettes  . Smokeless tobacco: Never Used     Comment: trying to quit 3-4 cigarettes per day  . Alcohol use 4.2 oz/week    6 Cans of beer, 1 Shots of liquor per week     Comment: a couple times weekly  . Drug use: No     Comment: a. last use was 8/16 - cocaine  . Sexual activity: Yes   Other Topics Concern  . Not on file   Social History Narrative   Lives in [place].   Review of Systems: A complete ROS was negative except as per HPI.   Physical Exam: Blood pressure (!) 128/93, pulse (!) 101, temperature 98.5 F (36.9 C), temperature source Oral, resp. rate (!) 21, SpO2 95 %. Physical Exam General: alert, well-developed, and cooperative to examination.  Head: normocephalic and atraumatic.  Eyes: vision grossly intact, pupils equal, pupils round, pupils reactive to light, no injection  and anicteric.  Mouth: pharynx pink and moist, no erythema, and no exudates.  Neck: supple, full ROM, no thyromegaly, no JVD, and no carotid bruits.  Lungs: normal respiratory effort, no accessory muscle use, diffuse expiratory wheezing and rhonchi with decreased air movement Heart: mildly tachycaridc, regular rhythm, no murmur, no gallop, and no rub.  Abdomen: soft, non-tender, normal bowel sounds, no distention  Msk: no joint swelling, no joint warmth, and no redness over joints. TTP over right chest wall.  Pulses: 2+ DP/PT pulses bilaterally Extremities: No cyanosis, clubbing, edema Neurologic:  alert & oriented X3, no focal deficits.  Skin: superficial abrasions across right right and hand.  Psych: normal mood and affect  EKG: personally reviewed my interpretation is NSR, unchanged from prior  CXR: personally reviewed my interpretation is no acute cardiopulmonary procress  Assessment & Plan by Problem:  COPD exacerbation: Patient with 6 day history of worsened shortness of breath, new cough with purulent sputum production. Using albuterol copiously outpatient. Has been satting well on room air both at rest and with ambulation. Symptomatic improvement in the ED with nebs however lungs remain tight with diffuse wheezing and rhonchi. He reports only having asthma however PFTs from 06/2016 more consistent with early parenchymal process as well as mild airway obstruction. No response to bronchodilators noted. No extrapulmonary symptoms reported. Most likely seconary to his chronic tobacco use. CXR clear without any evidence of PNA. Will treat for COPD exacerbation with duonebs, dulera (on advair at home), steroids and azithromycin. Flutter valve for his sputum production.   Right sided pleuritic chest pain s/p fall: CXR without any evidence of fracture. Pain is worse with deep inspiration, cough, sneezing. Tenderness to palpation over the region. Likely MSK in nature secondary to his fall. Will  give toradol, acetaminophen prn.   Bilateral exertional leg pain: Patient with complaints of bilateral leg weakness and pain when walking up hills. Improves with rest. Asymptomatic on flat surfaces. Has 2+ DP/PT on right, 1+ on left. Symptoms concerning for intermittent claudication given his history of CAD. Appears to have had normal ABIs 05/2016 but with decrease in blood flow to the toes.   Elevated blood glucose: Likely secondary to high dose steroids. Last A1c 04/2106 was normal.   CAD s/p NSTEMI, bare mental stent to 1st diagonal in 2015 with re-stenosis: Will continue home Lipitor 40 mg daily. Not on ASA outpatient. ? Due to being on Eliquis. Will need to clarify and consider starting.   Cocaine abuse: History of cocaine abuse. Reports quitting year ago. Most recent UDS 01/2016 positive for cocaine. Positive in 2016 as well with 3 interval UDS negative in between. Reports passing out drunk last week after getting into argument with his girlfriend. Reports friends told him he 'tasted' cocaine at that time but he does not recall this. Checking UDS.    Tobacco abuse: Smokes 1/4 PPD. Tobacco cessation education.   History of DVTs on Anticoagulation: Appears to have a history of recurrent DVTs (he reports 10 in each leg?). IVC placed in the past. Currently on Eliquis long term. Reports compliance. Will continue. No evidence of recurrent DVT or a PE today.   CODE: Full  Dispo: Admit patient to Observation with expected length of stay less than 2 midnights.  Signed: Maryellen Pile, MD 12/20/2016, 10:48 AM

## 2016-12-20 NOTE — ED Triage Notes (Signed)
Patient from home. States around 2030 last night he began to feel very short of breath. Hx of Asthma and COPD. 1 week ago patient feel and injured right ribs. Since that time patient has had severe pain in right rib cage. EMS gave 5mg  Albuterol, 0.5mg  Atrovent, and 125mg  Solu-medrol PTA. Patient states his breathing is much better since the treatments given by EMS.

## 2016-12-20 NOTE — ED Provider Notes (Signed)
[facility] DEPT Provider Note   CSN: [id number] Arrival date & time: 12/20/16  0448     History   Chief Complaint Chief Complaint  Patient presents with  . Asthma    HPI [name] is a 56 y.o. male.  The history is provided by the patient.  Asthma  This is a recurrent problem. The current episode started more than 2 days ago. The problem has been gradually worsening. Associated symptoms include shortness of breath. Nothing aggravates the symptoms. Nothing relieves the symptoms.  pt with h/o asthma presents with increasing cough/wheezing/shortness of breath.  He reports it started several days ago, he was seen in the ED 9/24 for his asthma and he had some improvement However last night his cough/wheezing worsened No hemoptysis, he is only coughing up yellow sputum He endorses right sided chest wall pain due to recent fall over his dog.  No fractures had been noted but he reports still feeling sore in his chest  Pt reports he has h/o DVT and is taking eliquis - no missed doses Past Medical History:  Diagnosis Date  . Asthma   . CAD (coronary artery disease)    a. 09/2013 NSTEMI/PCI: LM nl, LAD 40-50%, D1 100 (2.25 x 28 Vision BMS), LCX min irregs, RI 60-70, 30, RCA 40-50/50-91ms/p, EF 45-50%.  b. cath 12/2014 -occulded BMS in diag, 50% pro to mid LAD, 60% ramus, 30% RCA   . Chest pain 08/2015  . Chronic diastolic CHF (congestive heart failure) ([place])    a. 09/2014 EF 45-50% by LV gram;  b. 01/2014 Echo: EF 55-60%, Gr 1 DD.  Marland Kitchen Cocaine abuse   . COPD (chronic obstructive pulmonary disease) ([place])   . DVT (deep venous thrombosis) ([place])    "I had ~ 10 in each leg"  . Essential hypertension   . Hepatitis C    "clear free over a year now"  . History of pneumonia   . Osteoarthritis    a. hands and toes.  . Pulmonary embolism ([place])    a. 2012 - s/p IVC filter;  b. prev on eliquis - noncompliant.  . Shortness of breath dyspnea   . Sinus headache   . Squamous cell skin  cancer 07/13/2016  . Tobacco abuse     Patient Active Problem List   Diagnosis Date Noted  . History of squamous cell carcinoma 08/03/2016  . Psoriasis 08/03/2016  . Squamous cell skin cancer 07/13/2016  . Chronic anticoagulation 02/01/2016  . Malnutrition of moderate degree 06/05/2015  . COPD exacerbation ([place]) 06/03/2015  . Liver fibrosis 04/08/2015  . COPD (chronic obstructive pulmonary disease) ([place]) 01/01/2015  . Essential hypertension 01/01/2015  . Unstable angina ([place]) 01/01/2015  . History of coronary artery disease   . Cocaine abuse 12/01/2014  . Pleuritic chest pain 12/01/2014  . Chronic diastolic heart failure ([place]) 12/01/2014  . QT prolongation 12/01/2014  . Chronic hepatitis C without hepatic coma ([place]) 06/18/2014  . HTN (hypertension) 04/27/2014  . Coronary artery disease involving native coronary artery of native heart without angina pectoris   . CAD (coronary artery disease) 10/21/2013  . NSTEMI (non-ST elevated myocardial infarction) ([place]) 10/21/2013  . Anemia 06/20/2013  . Regular alcohol consumption 06/18/2013  . History of DVT (deep vein thrombosis) 02/21/2013  . Thrombocytopenia ([place]) 08/09/2012  . Tobacco abuse 08/09/2012  . History of noncompliance with medical treatment 03/24/2012  . Atopic dermatitis 03/24/2012  . History of pulmonary embolism 02/10/2012    Past Surgical History:  Procedure Laterality Date  .  CARDIAC CATHETERIZATION N/A 01/04/2015   Procedure: Left Heart Cath and Coronary Angiography;  Surgeon: Burnell Blanks, MD;  Location: [facility];  Service: Cardiovascular;  Laterality: N/A;  . CORONARY ANGIOPLASTY WITH STENT PLACEMENT  10/21/13   BMS to D1  . CYST EXCISION N/A 05/10/2016   Procedure: EXCISION OF SEBACEOUS CYST UPPER BACK;  Surgeon: Donnie Mesa, MD;  Location: [facility];  Service: General;  Laterality: N/A;  . LEFT HEART CATHETERIZATION WITH CORONARY ANGIOGRAM N/A 10/21/2013   Procedure: LEFT HEART CATHETERIZATION  WITH CORONARY ANGIOGRAM;  Surgeon: Leonie Man, MD;  Location: [facility] CATH LAB;  Service: Cardiovascular;  Laterality: N/A;  . VENA CAVA FILTER PLACEMENT  " ~ 2012       Home Medications    Prior to Admission medications   Medication Sig Start Date End Date Taking? Authorizing Provider  albuterol (PROVENTIL) (2.5 MG/3ML) 0.083% nebulizer solution Take 3 mLs (2.5 mg total) by nebulization every 6 (six) hours as needed for wheezing or shortness of breath. 08/03/16   Arnoldo Morale, MD  apixaban (ELIQUIS) 5 MG TABS tablet Take 1 tablet (5 mg total) by mouth 2 (two) times daily. 08/17/16   Arnoldo Morale, MD  atorvastatin (LIPITOR) 40 MG tablet Take 1 tablet (40 mg total) by mouth daily. 08/03/16   Arnoldo Morale, MD  cephALEXin (KEFLEX) 500 MG capsule Take 1 mg by mouth 3 (three) times daily.  06/28/16   Trula Slade, DPM  doxycycline (VIBRAMYCIN) 100 MG capsule Take 100 mg by mouth 2 (two) times daily. 08/02/16   [provider]  ELIQUIS 2.5 MG TABS tablet  08/03/16   [provider]  fluticasone (FLONASE) 50 MCG/ACT nasal spray Place 2 sprays into both nostrils 2 (two) times daily. 05/20/16   Reyne Dumas, MD  Fluticasone-Salmeterol (ADVAIR DISKUS) 250-50 MCG/DOSE AEPB Inhale 1 puff into the lungs 2 (two) times daily. 08/03/16   Arnoldo Morale, MD  HYDROcodone-acetaminophen (NORCO/VICODIN) 5-325 MG tablet Take 1 tablet by mouth every 8 (eight) hours as needed for moderate pain. 12/18/16   Virgel Manifold, MD  magnesium oxide (MAG-OX) 400 (241.3 Mg) MG tablet Take 1 tablet (400 mg total) by mouth daily. Patient not taking: Reported on 08/03/2016 05/17/16   Reyne Dumas, MD  mupirocin ointment (BACTROBAN) 2 % Apply 1 application topically as directed. 08/02/16   [provider]  oxyCODONE-acetaminophen (PERCOCET/ROXICET) 5-325 MG tablet Take 1-2 tablets by mouth every 4 (four) hours as needed for severe pain. 06/28/16   Trula Slade, DPM  pantoprazole (PROTONIX) 40 MG tablet  Take 1 tablet (40 mg total) by mouth daily. 08/03/16   Arnoldo Morale, MD  predniSONE (DELTASONE) 20 MG tablet Take 2 tablets (40 mg total) by mouth daily. 12/18/16   Virgel Manifold, MD  promethazine (PHENERGAN) 25 MG tablet Take 1 mg by mouth every 6 (six) hours as needed for nausea or vomiting.  06/28/16   Trula Slade, DPM  triamcinolone (KENALOG) 0.025 % ointment Apply 1 application topically 2 (two) times daily. 08/03/16   Arnoldo Morale, MD  VENTOLIN HFA 108 (90 Base) MCG/ACT inhaler Inhale 2 puffs into the lungs every 6 (six) hours as needed for wheezing or shortness of breath. 09/25/16   Arnoldo Morale, MD    Family History Family History  Problem Relation Age of Onset  . Asthma Mother   . Allergies Mother   . Allergies Sister   . Allergies Brother   . Deep vein thrombosis Brother  two brothers with recurrent DVT  . Heart attack Father        a.60s b. deceased in his 6s  . Coronary artery disease Father     Social History Social History  Substance Use Topics  . Smoking status: Current Every Day Smoker    Packs/day: 0.25    Years: 38.00    Types: Cigarettes  . Smokeless tobacco: Never Used     Comment: trying to quit 3-4 cigarettes per day  . Alcohol use 4.2 oz/week    6 Cans of beer, 1 Shots of liquor per week     Comment: a couple times weekly     Allergies   No known allergies   Review of Systems Review of Systems  Constitutional: Negative for fever.  Respiratory: Positive for cough, shortness of breath and wheezing.   Musculoskeletal: Positive for myalgias.  All other systems reviewed and are negative.    Physical Exam Updated Vital Signs BP 139/88 (BP Location: Left Arm)   Pulse 81   Temp 98.5 F (36.9 C) (Oral)   Resp (!) 22   SpO2 100%   Physical Exam CONSTITUTIONAL: Elderly, no acute distress HEAD: Normocephalic/atraumatic EYES: EOMI/PERRL ENMT: Mucous membranes moist NECK: supple no meningeal signs SPINE/BACK:entire spine  nontender CV: S1/S2 noted, no murmurs/rubs/gallops noted LUNGS: wheezing bilaterally, no acute distress Chest - diffuse soreness to right side, no crepitus ABDOMEN: soft, nontender GU:no cva tenderness NEURO: Pt is awake/alert/appropriate, moves all extremitiesx4.  No facial droop.   EXTREMITIES: pulses normal/equal, full ROM SKIN: warm, color normal PSYCH: no abnormalities of mood noted, alert and oriented to situation  ED Treatments / Results  Labs (all labs ordered are listed, but only abnormal results are displayed) Labs Reviewed  I-STAT CHEM 8, ED    EKG  EKG Interpretation None       Radiology Dg Chest 2 View  Result Date: 12/20/2016 CLINICAL DATA:  Cough, shortness of breath and wheezing tonight. EXAM: CHEST  2 VIEW COMPARISON:  Radiographs 2 days prior 12/18/2016, additional priors FINDINGS: The cardiomediastinal contours are normal. Stable chronic bronchial thickening. Pulmonary vasculature is normal. No consolidation, pleural effusion, or pneumothorax. No acute osseous abnormalities are seen. IMPRESSION: Stable chronic bronchial thickening without acute abnormality. Electronically Signed   By: Jeb Levering M.D.   On: 12/20/2016 05:35   Dg Chest 2 View  Result Date: 12/18/2016 CLINICAL DATA:  Chest pain, shortness of breath, cough, history asthma, RIGHT-side chest pain since a fall on Wednesday, COPD, CHF, smoker EXAM: CHEST  2 VIEW COMPARISON:  05/15/2016 FINDINGS: Normal heart size, mediastinal contours, and pulmonary vascularity. Atherosclerotic calcification aorta. Bronchitic changes without pulmonary infiltrate, pleural effusion or pneumothorax. No definite acute osseous findings. IMPRESSION: Mild chronic bronchitic changes without acute abnormality. Electronically Signed   By: Lavonia Dana M.D.   On: 12/18/2016 12:25    Procedures Procedures (including critical care time)  Medications Ordered in ED Medications  albuterol (PROVENTIL,VENTOLIN) solution  continuous neb (not administered)  HYDROcodone-acetaminophen (NORCO/VICODIN) 5-325 MG per tablet 1 tablet (1 tablet Oral Given 12/20/16 0642)  albuterol (PROVENTIL) (2.5 MG/3ML) 0.083% nebulizer solution 5 mg (5 mg Nebulization Given 12/20/16 3762)     Initial Impression / Assessment and Plan / ED Course  I have reviewed the triage vital signs and the nursing notes.  Pertinent imaging results that were available during my care of the patient were reviewed by me and considered in my medical decision making (see chart for details).     6:21 AM  Pt still with wheezing/cough Will give another round of nebs CXR reviewed and negative 7:27 AM Pt still with cough/wheezing Will give an hour long neb treatment If no improvement, then he will need admitted Signed out to dr Tyrone Nine for reassessment   Final Clinical Impressions(s) / ED Diagnoses   Final diagnoses:  Severe persistent asthma with exacerbation    New Prescriptions New Prescriptions   No medications on file     Ripley Fraise, MD 12/20/16 [phone]

## 2016-12-20 NOTE — ED Notes (Signed)
Pt ordered meal tray for lunch

## 2016-12-20 NOTE — ED Notes (Signed)
Ambulated pt in hallway. Pt states feeling sob, slight dizziness. Pt has steady and even gait. HR 108-110, pulse ox. 94-96% RA.

## 2016-12-20 NOTE — ED Provider Notes (Signed)
See the patient in signout from Dr. Christy Gentles. Briefly the patient is having an asthma exacerbation. Going on for the past 2 days. Is having some chest wall pain is worse with breathing and coughing. Chest x-ray is negative for pneumonia. The plan is for him to receive continuous treatment and then reassess. He received Solu-Medrol en route with EMS. On my initial exam the patient has diffuse wheezes with prolonged expiration.  Repeat exam with still some persistent significant wheezes. Patient states that he feels somewhat better at rest though feels a bit weak whenever he gets up to move around. Was able to ambulate with no hypoxia the patient still significantly symptomatic and has significant wheezes. Given magnesium without improvement. Will discuss with the hospitalist for admission.  CRITICAL CARE Performed by: Cecilio Asper   Total critical care time: 35 minutes  Critical care time was exclusive of separately billable procedures and treating other patients.  Critical care was necessary to treat or prevent imminent or life-threatening deterioration.  Critical care was time spent personally by me on the following activities: development of treatment plan with patient and/or surrogate as well as nursing, discussions with consultants, evaluation of patient's response to treatment, examination of patient, obtaining history from patient or surrogate, ordering and performing treatments and interventions, ordering and review of laboratory studies, ordering and review of radiographic studies, pulse oximetry and re-evaluation of patient's condition.  The patients results and plan were reviewed and discussed.   Any x-rays performed were independently reviewed by myself.   Differential diagnosis were considered with the presenting HPI.  Medications  ipratropium-albuterol (DUONEB) 0.5-2.5 (3) MG/3ML nebulizer solution 3 mL (not administered)  HYDROcodone-acetaminophen (NORCO/VICODIN) 5-325 MG  per tablet 1 tablet (1 tablet Oral Given 12/20/16 0642)  albuterol (PROVENTIL) (2.5 MG/3ML) 0.083% nebulizer solution 5 mg (5 mg Nebulization Given 12/20/16 2440)  albuterol (PROVENTIL,VENTOLIN) solution continuous neb (10 mg/hr Nebulization Given 12/20/16 0729)  magnesium sulfate IVPB 2 g 50 mL (0 g Intravenous Stopped 12/20/16 0927)    Vitals:   12/20/16 0733 12/20/16 0740 12/20/16 0851 12/20/16 0924  BP:  (!) 140/96  (!) 128/93  Pulse:  78 91 (!) 101  Resp:  (!) 25  (!) 21  Temp:      TempSrc:      SpO2: 100% 100%  95%    Final diagnoses:  Severe persistent asthma with exacerbation    Admission/ observation were discussed with the admitting physician, patient and/or family and they are comfortable with the plan.      Deno Etienne, DO 12/20/16 1037

## 2016-12-20 NOTE — ED Notes (Signed)
Pt ambulated in hallway.  Sats remained 95%, rr 18, hr 110.

## 2016-12-20 NOTE — ED Notes (Signed)
Pt states he became really angry with his girlfriend last week and became incoherent.  His friends told him he may have tasted cocaine, which, he states, is not like him.

## 2016-12-20 NOTE — Progress Notes (Signed)
[name] [id number] Admission Data: 12/20/2016 6:17 PM Attending Provider: Sid Falcon, MD  MEB:RAXE, [name] Ferretti, MD Consults/ Treatment Team:   [name] is a 56 y.o. male patient admitted from ED awake, alert  & orientated  X 3,  Full Code, VSS - Blood pressure 125/68, pulse 72, temperature (!) 97.5 F (36.4 C), temperature source Oral, resp. rate 20, SpO2 100 %.,    IV site WDL:  hand right, condition patent and no redness with a transparent dsg that's clean dry and intact.  Allergies:   Allergies  Allergen Reactions  . No Known Allergies      Past Medical History:  Diagnosis Date  . Asthma   . CAD (coronary artery disease)    a. 09/2013 NSTEMI/PCI: LM nl, LAD 40-50%, D1 100 (2.25 x 28 Vision BMS), LCX min irregs, RI 60-70, 30, RCA 40-50/50-21ms/p, EF 45-50%.  b. cath 12/2014 -occulded BMS in diag, 50% pro to mid LAD, 60% ramus, 30% RCA   . Chest pain 08/2015  . Chronic diastolic CHF (congestive heart failure) ([place])    a. 09/2014 EF 45-50% by LV gram;  b. 01/2014 Echo: EF 55-60%, Gr 1 DD.  Marland Kitchen Cocaine abuse   . COPD (chronic obstructive pulmonary disease) ([place])   . DVT (deep venous thrombosis) ([place])    "I had ~ 10 in each leg"  . Essential hypertension   . Hepatitis C    "clear free over a year now"  . History of pneumonia   . Osteoarthritis    a. hands and toes.  . Pulmonary embolism ([place])    a. 2012 - s/p IVC filter;  b. prev on eliquis - noncompliant.  . Shortness of breath dyspnea   . Sinus headache   . Squamous cell skin cancer 07/13/2016  . Tobacco abuse     History:  obtained from the patient. Tobacco/alcohol: Smokes 3-5 cigarettes a day Pt orientation to unit, room and routine. Information packet given to patient/family.  Admission INP armband ID verified with patient/family, and in place. SR up x 2, fall risk assessment complete with Patient and family verbalizing understanding of risks associated with falls. Pt verbalizes an understanding of how to use  the call bell and to call for help before getting out of bed.  Skin, clean-dry- intact without evidence of bruising, or skin tears.   No evidence of skin break down noted on exam. no rashes, no ecchymoses. Back has dark splotches    Will cont to monitor and assist as needed.  [name] Hjort Margaretha Sheffield, RN 12/20/2016 6:17 PM

## 2016-12-21 DIAGNOSIS — Z86718 Personal history of other venous thrombosis and embolism: Secondary | ICD-10-CM | POA: Diagnosis not present

## 2016-12-21 DIAGNOSIS — J439 Emphysema, unspecified: Secondary | ICD-10-CM | POA: Diagnosis not present

## 2016-12-21 DIAGNOSIS — F1411 Cocaine abuse, in remission: Secondary | ICD-10-CM | POA: Diagnosis not present

## 2016-12-21 DIAGNOSIS — Z955 Presence of coronary angioplasty implant and graft: Secondary | ICD-10-CM

## 2016-12-21 DIAGNOSIS — Z79899 Other long term (current) drug therapy: Secondary | ICD-10-CM | POA: Diagnosis not present

## 2016-12-21 DIAGNOSIS — Z7901 Long term (current) use of anticoagulants: Secondary | ICD-10-CM | POA: Diagnosis not present

## 2016-12-21 DIAGNOSIS — Z720 Tobacco use: Secondary | ICD-10-CM

## 2016-12-21 DIAGNOSIS — Z7951 Long term (current) use of inhaled steroids: Secondary | ICD-10-CM

## 2016-12-21 DIAGNOSIS — Z9181 History of falling: Secondary | ICD-10-CM

## 2016-12-21 DIAGNOSIS — I2510 Atherosclerotic heart disease of native coronary artery without angina pectoris: Secondary | ICD-10-CM | POA: Diagnosis not present

## 2016-12-21 DIAGNOSIS — J441 Chronic obstructive pulmonary disease with (acute) exacerbation: Secondary | ICD-10-CM | POA: Diagnosis not present

## 2016-12-21 DIAGNOSIS — I252 Old myocardial infarction: Secondary | ICD-10-CM

## 2016-12-21 LAB — HIV ANTIBODY (ROUTINE TESTING W REFLEX): HIV Screen 4th Generation wRfx: NONREACTIVE

## 2016-12-21 MED ORDER — DM-GUAIFENESIN ER 30-600 MG PO TB12
1.0000 | ORAL_TABLET | Freq: Two times a day (BID) | ORAL | Status: DC
  Administered 2016-12-21 – 2016-12-22 (×3): 1 via ORAL
  Filled 2016-12-21 (×3): qty 1

## 2016-12-21 MED ORDER — IPRATROPIUM-ALBUTEROL 0.5-2.5 (3) MG/3ML IN SOLN: 3.0000 mL | RESPIRATORY_TRACT | Status: DC | PRN

## 2016-12-21 NOTE — Consult Note (Signed)
           [facility] CM Primary Care Navigator  12/21/2016  [name] 1961/02/27 [id number]   Seenpatient at the bedside to identify possible discharge needs.  Patient reports that he "still couldn't breath" inspite taking medications that were ordered when he was seen in the ED on 9/24 that made him come back resulting to this admission. Patient end[facility] theprimary [facility] provider.   [facility] [address] obtain medications without difficulty.   Patient states that he manages his own medications at home straight out of the containers.  Patient verbalized that he has been using transportation from the [place] and Transportation to bring him to his doctors'appointments.  Patient mentioned that he is independent with self care and expects to do the same when discharged. He states that he has a housemate Public relations account executive and girlfriend) who will be able to assist him with care at home if needed.   Anticipateddischarge plan ishome according to patient.  Patient voicedunderstanding to call primary care provider's officewhen he gets home,for a post discharge follow-upwithin a week or sooner if needed.Patient letter (with PCP's contact number) was provided as a reminder.   Discussed withpatientregardingTHN CM services available for health management at home (mainly COPD). Patient verbalized that he would prefer phone calls over home visits since he does not own the house and is just renting from his housemate.  Patient opted andverbally agreedtoEMMI COPDcalls to follow-up with his recovery.   Referral was made for [facility] COPDcalls after discharge.  Patient voicedunderstandingto seekreferral from primary care provider to [facility] care management if deemed necessaryfor further services in the future.  [facility] care management  information provided for future needs that may arise.   For questions, please contact:  [name], BSN, RN- [facility] Primary Care Navigator  Telephone: [phone] [name]

## 2016-12-21 NOTE — Progress Notes (Signed)
  Date: 12/21/2016  Patient name: [name]  Medical record number: [id number]  Date of birth: 1961/01/07   I have seen and evaluated [name] and discussed their care with the Residency Team. Briefly, [name] is a 56yo man with PMH of emphysema (PFTs this year with Gold 1 (FEV1 79% predicted), tobacco use, CAD, h/o cocaine abuse, DVTs [facility] who presented for SOB.  He has noticed SOB for 6 days.  This is associated with cough productive of white sputum and wheezing.  He has tried his albuterol and nebulizer with only temporary relief.  Since he did not improve, he came in to the hospital for treatment.  He was managed with steroids and nebulizers in the ED and did not improve so he was admitted.  Of note, he did not require oxygen and ambulated without desaturation.    Soc Hx : Current every day smoker, last used cocaine about 1 month ago.   Vitals:   12/21/16 0518 12/21/16 0757  BP: 129/76   Pulse: 69   Resp: 18   Temp: 98.3 F (36.8 C)   SpO2: 96% 98%   Physical Exam:  Gen: Improved, conversant, speaking in full sentences without need for breaks Eyes: Anicteric sclerae CV: RR< NR, no murmur Pulm: End expiratory wheezing throughout, no crackles.   Abd: Soft, +BS, NT Ext: Thin, no edema Skin: No rash on exposed skin.   Pertinent data Glu 260 (on steroids) WBC 4.5  HIV NR  CXR as read by Dr. Marisue Humble IMPRESSION: Stable chronic bronchial thickening without acute abnormality.   Assessment and Plan: I have seen and evaluated the patient as outlined above. I agree with the formulated Assessment and Plan as detailed in the residents' note, with the following changes:   1. COPD exacerbation - He has very mild disease, possibly his contusion to chest or a mild viral illness caused his flare - Continue steroids, transition to PO - Duonebs scheduled and PRN - Azithromycin for 5 day course - Flutter valve - Cough suppression if needed  His other issues are stable  and home medications were continued.  He did have a fall, tripped over his dog, and has had some pain in the chest with deep breathing.  CXR did not show any rib fractures.  Would send home with an incentive spirometer to make sure he does not develop pneumonia.     Sid Falcon, MD 9/27/20182:04 PM

## 2016-12-21 NOTE — Progress Notes (Signed)
   Subjective: Patient was feeling much better today. Denied pain, visual changes, fever, chills or muscle aches. Stated that he did not believe he was able to go home today as his SOB and cough were still significant and concerning. He described coughing episodes that resulted in significant respiratory distress that had improved since admission, but were not completely resolved. These cause him great concern as he is not able to breath while experiencing them.   Objective:  Vital signs in last 24 hours: Vitals:   12/20/16 1752 12/20/16 2126 12/21/16 0518 12/21/16 0757  BP: 125/68 123/88 129/76   Pulse: 72 75 69   Resp: 20 18 18    Temp: (!) 97.5 F (36.4 C) 98.3 F (36.8 C) 98.3 F (36.8 C)   TempSrc: Oral Oral Oral   SpO2: 100% 98% 96% 98%   ROS negative except as per HPI.  Physical Exam  Constitutional: He appears well-nourished. No distress.  Cardiovascular: Normal rate and regular rhythm.   No murmur heard. Pulmonary/Chest: Effort normal. No stridor. No respiratory distress. He has wheezes in the right upper field, the right middle field, the right lower field, the left upper field, the left middle field and the left lower field.  Abdominal: Soft. Bowel sounds are normal. He exhibits no distension.  Musculoskeletal: He exhibits no edema.  Neurological: He is alert.  Skin: Skin is warm.    Assessment/Plan:  Principal Problem:   COPD with acute exacerbation (HCC) Active Problems:   Tobacco abuse   CAD (coronary artery disease)   Pleuritic chest pain   Chronic anticoagulation  COPD exacerbation: Patient feeling better still concerning for significant pulmonary wheezing Plan to continue treating with prednisone 40 mg Continue duo nebs every 4 when necessary Dulare 2 times daily And Mucinex 2 times daily  CAD s/p NSTEMI w/ bare metal stent Continue home lipitor 40mg  Continue eliquis  Dispo: Anticipated discharge in approximately tomorrow.   Kathi Ludwig,  MD 12/21/2016, 11:06 AM Pager: Pager# [phone]

## 2016-12-21 NOTE — Progress Notes (Signed)
Nutrition Brief Note  Patient identified on the Malnutrition Screening Tool (MST) Report  Wt Readings from Last 15 Encounters:  12/21/16 189 lb 9.6 oz (86 kg)  08/03/16 190 lb 9.6 oz (86.5 kg)  07/14/16 197 lb (89.4 kg)  05/30/16 192 lb 12.8 oz (87.5 kg)  05/16/16 198 lb 4.8 oz (89.9 kg)  05/10/16 194 lb 6.4 oz (88.2 kg)  05/09/16 194 lb 6.4 oz (88.2 kg)  04/25/16 193 lb 3.2 oz (87.6 kg)  03/28/16 193 lb (87.5 kg)  03/28/16 194 lb 12.8 oz (88.4 kg)  02/22/16 187 lb 9.6 oz (85.1 kg)  02/07/16 191 lb 12.8 oz (87 kg)  02/01/16 180 lb 8 oz (81.9 kg)  01/07/16 182 lb (82.6 kg)  11/06/15 169 lb 15.6 oz (77.1 kg)   [name] is a 56 year old male with a past medical history significant for emphysema with current tobacco abuse, CAD with prior NSTEMI, squamous cell carcinoma s/p excision, cocaine abuse, prior DVTs on anticoagulation presenting with a 6 day history of shortness of breath and cough  Pt admitted with COPD exacerbation.   Pt did not answer most RD questions. He reports poor appetite but did not give further details. Per wt hx, wt has been stable.   Nutrition-Focused physical exam completed. Findings are no fat depletion, no muscle depletion, and no edema.   Body mass index is 24.34 kg/m. Patient meets criteria for normal weight range based on current BMI.   Current diet order is Heart Healthy, patient is consuming approximately n/a% of meals at this time. Labs and medications reviewed.   No nutrition interventions warranted at this time. If nutrition issues arise, please consult RD.   [name], RD, LDN, CDE Pager: [phone] After hours Pager: [phone]

## 2016-12-22 DIAGNOSIS — J441 Chronic obstructive pulmonary disease with (acute) exacerbation: Secondary | ICD-10-CM | POA: Diagnosis not present

## 2016-12-22 MED ORDER — VENTOLIN HFA 108 (90 BASE) MCG/ACT IN AERS: 2.0000 | INHALATION_SPRAY | Freq: Four times a day (QID) | RESPIRATORY_TRACT | 1 refills | Status: DC | PRN

## 2016-12-22 MED ORDER — ALBUTEROL SULFATE (2.5 MG/3ML) 0.083% IN NEBU: 2.5000 mg | INHALATION_SOLUTION | Freq: Four times a day (QID) | RESPIRATORY_TRACT | 1 refills | Status: DC | PRN

## 2016-12-22 MED ORDER — PREDNISONE 20 MG PO TABS: 40.0000 mg | ORAL_TABLET | Freq: Every day | ORAL | 0 refills | Status: DC

## 2016-12-22 MED ORDER — ATORVASTATIN CALCIUM 40 MG PO TABS: 40.0000 mg | ORAL_TABLET | Freq: Every day | ORAL | 2 refills | Status: DC

## 2016-12-22 NOTE — Discharge Summary (Signed)
Name: [name] MRN: [id number] DOB: 07-11-1960 56 y.o. PCP: [name] Morale, MD  Date of Admission: 12/20/2016  4:48 AM Date of Discharge: 12/22/2016 Attending Physician: [name] Falcon, MD  Discharge Diagnosis: Principal Problem:   COPD with acute exacerbation [facility])   Discharge Medications: Allergies as of 12/22/2016      Reactions   No Known Allergies       Medication List    STOP taking these medications   HYDROcodone-acetaminophen 5-325 MG tablet Commonly known as:  NORCO/VICODIN     TAKE these medications   acetaminophen 325 MG tablet Commonly known as:  TYLENOL Take 650 mg by mouth every 6 (six) hours as needed for mild pain.   albuterol (2.5 MG/3ML) 0.083% nebulizer solution Commonly known as:  PROVENTIL Take 3 mLs (2.5 mg total) by nebulization every 6 (six) hours as needed for wheezing or shortness of breath.   VENTOLIN HFA 108 (90 Base) MCG/ACT inhaler Generic drug:  albuterol Inhale 2 puffs into the lungs every 6 (six) hours as needed for wheezing or shortness of breath.   apixaban 5 MG Tabs tablet Commonly known as:  ELIQUIS Take 1 tablet (5 mg total) by mouth 2 (two) times daily.   atorvastatin 40 MG tablet Commonly known as:  LIPITOR Take 1 tablet (40 mg total) by mouth daily at 6 PM.   Fluticasone-Salmeterol 250-50 MCG/DOSE Aepb Commonly known as:  ADVAIR DISKUS Inhale 1 puff into the lungs 2 (two) times daily.   predniSONE 20 MG tablet Commonly known as:  DELTASONE Take 2 tablets (40 mg total) by mouth daily with breakfast. What changed:  when to take this            Discharge Care Instructions        Start     Ordered   12/22/16 0000  atorvastatin (LIPITOR) 40 MG tablet  Daily-1800     12/22/16 1134   12/22/16 0000  predniSONE (DELTASONE) 20 MG tablet  Daily with breakfast     12/22/16 1134   12/22/16 0000  Increase activity slowly     12/22/16 1134   12/22/16 0000  Diet - low sodium heart healthy     12/22/16 1134   12/22/16 0000  Call MD for:  difficulty breathing, headache or visual disturbances     12/22/16 1134      Disposition and follow-up:   [name] was discharged from [facility] in Stable condition.  At the hospital follow up visit please address:  1.  A. Please assist the patient with insuring medication compliance, he may benefit from PFT's      B. Determine if the patients symptoms have resolved       2.  Labs / imaging needed at time of follow-up: PFT's  3.  Pending labs/ test needing follow-up: n/a  Follow-up Appointments:   Hospital Course by problem list: Principal Problem:   COPD with acute exacerbation ([place])   COPD with acute exacerbation: Patient presented with dyspnea, productive cough, and wheezing for 3 days which are progressively worsened. He states that he initially visited the ED 2 days prior to this admission for the same symptoms, was given steroids and antibiotics and discharged home. He returned when his symptoms failed to resolve. He was treated with 2 doses of Solu-Medrol ED and azithromycin, with duonebs as needed. He patient was placed with home medication regimen consisting of albuterol nebulizer, apixaban, Advair Diskus, Ventolin as needed. Significant admission patient continued to demonstrate  significant wheezing in all 4 lung zones, with productive cough and shortness of breath with ambulation. He was treated for an additional day for total of 3 days to nights, with complete resolution of his symptoms before being discharged home. Patient was discharged in stable condition and informed that he should follow-up with his PCP within one week. In addition he was advised to call his PCP or return to the ED if he again developed similar symptoms or other concerning symptoms.   Discharge Vitals:   BP (!) 149/87 (BP Location: Right Arm)   Pulse 64   Temp 98.3 F (36.8 C) (Oral)   Resp 18   Ht 6\' 2"  (1.88 m)   Wt 189 lb 9.6 oz (86 kg)    SpO2 98%   BMI 24.34 kg/m   Pertinent Labs, Studies, and Procedures:  CBC Latest Ref Rng & Units 12/20/2016 12/20/2016 12/18/2016  WBC 4.0 - 10.5 K/uL - 4.5 4.4  Hemoglobin 13.0 - 17.0 g/dL 14.3 13.0 13.8  Hematocrit 39.0 - 52.0 % 42.0 39.8 42.0  Platelets 150 - 400 K/uL - 127(L) 147(L)   CMP Latest Ref Rng & Units 12/20/2016 12/18/2016 08/03/2016  Glucose 65 - 99 mg/dL 260(H) 110(H) 106(H)  BUN 6 - 20 mg/dL 19 8 12   Creatinine 0.61 - 1.24 mg/dL 0.80 0.95 0.87  Sodium 135 - 145 mmol/L 138 138 142  Potassium 3.5 - 5.1 mmol/L 4.3 4.4 4.6  Chloride 101 - 111 mmol/L 108 107 101  CO2 22 - 32 mmol/L - 25 23  Calcium 8.9 - 10.3 mg/dL - 9.4 9.5  Total Protein 6.0 - 8.5 g/dL - - 7.1  Total Bilirubin 0.0 - 1.2 mg/dL - - 0.3  Alkaline Phos 39 - 117 IU/L - - 124(H)  AST 0 - 40 IU/L - - 44(H)  ALT 0 - 44 IU/L - - 35   Chest X-ray IMPRESSION: Stable chronic bronchial thickening without acute abnormality.  Discharge Instructions: Discharge Instructions    Call MD for:  difficulty breathing, headache or visual disturbances    Complete by:  As directed    Diet - low sodium heart healthy    Complete by:  As directed    Increase activity slowly    Complete by:  As directed       Signed: Kathi Ludwig, MD 12/22/2016, 1:09 PM   Pager: Pager# [phone]

## 2016-12-22 NOTE — Progress Notes (Signed)
  Date: 12/22/2016  Patient name: [name]  Medical record number: [id number]  Date of birth: 1961/03/05   I have seen and evaluated this patient and I have discussed the plan of care with the house staff. Please see Dr. Nelma Rothman note for complete details.   I believe his HPI is for a different patient.  Patient was doing much better today and was excited to be discharged home.    Sid Falcon, MD 12/22/2016, 3:38 PM

## 2016-12-22 NOTE — Care Management Obs Status (Signed)
[facility] NOTIFICATION   Patient Details  Name: [name] MRN: [id number] Date of Birth: 03-28-1960   Medicare Observation Status Notification Given:  Yes    Sharin Mons, RN 12/22/2016, 10:44 AM

## 2016-12-22 NOTE — Progress Notes (Signed)
Discharge instructions given to the patient.  Patient verbalized understanding of discharge instructions.  Patient PIV removed.   Patient with discharge vitals Vitals:   12/22/16 0637 12/22/16 0755  BP: (!) 149/87   Pulse: 60 64  Resp: 18 18  Temp: 98.3 F (36.8 C)   SpO2: 95% 98%    Discharge AVS Allergies as of 12/22/2016      Reactions   No Known Allergies       Medication List    STOP taking these medications   HYDROcodone-acetaminophen 5-325 MG tablet Commonly known as:  NORCO/VICODIN     TAKE these medications   acetaminophen 325 MG tablet Commonly known as:  TYLENOL Take 650 mg by mouth every 6 (six) hours as needed for mild pain.   albuterol (2.5 MG/3ML) 0.083% nebulizer solution Commonly known as:  PROVENTIL Take 3 mLs (2.5 mg total) by nebulization every 6 (six) hours as needed for wheezing or shortness of breath.   VENTOLIN HFA 108 (90 Base) MCG/ACT inhaler Generic drug:  albuterol Inhale 2 puffs into the lungs every 6 (six) hours as needed for wheezing or shortness of breath.   apixaban 5 MG Tabs tablet Commonly known as:  ELIQUIS Take 1 tablet (5 mg total) by mouth 2 (two) times daily.   atorvastatin 40 MG tablet Commonly known as:  LIPITOR Take 1 tablet (40 mg total) by mouth daily at 6 PM.   Fluticasone-Salmeterol 250-50 MCG/DOSE Aepb Commonly known as:  ADVAIR DISKUS Inhale 1 puff into the lungs 2 (two) times daily.   predniSONE 20 MG tablet Commonly known as:  DELTASONE Take 2 tablets (40 mg total) by mouth daily with breakfast. What changed:  when to take this            Discharge Care Instructions        Start     Ordered   12/22/16 0000  atorvastatin (LIPITOR) 40 MG tablet  Daily-1800     12/22/16 1134   12/22/16 0000  predniSONE (DELTASONE) 20 MG tablet  Daily with breakfast     12/22/16 1134   12/22/16 0000  Increase activity slowly     12/22/16 1134   12/22/16 0000  Diet - low sodium heart healthy     12/22/16 1134   12/22/16 0000  Call MD for:  difficulty breathing, headache or visual disturbances     12/22/16 1134   12/22/16 0000  albuterol (PROVENTIL) (2.5 MG/3ML) 0.083% nebulizer solution  Every 6 hours PRN     12/22/16 1318   12/22/16 0000  VENTOLIN HFA 108 (90 Base) MCG/ACT inhaler  Every 6 hours PRN     12/22/16 1318     Patient escorted to car via wheelchair by volunteer services/

## 2016-12-22 NOTE — Discharge Instructions (Signed)
Please call your primary care physician's office to schedule an appointment with them for approximately 1-2 weeks for follow-up visit. Recent hospital admission.  Your are being prescribed prednisone tablets, please take 2 tablets per day for 3 days until completed.  If at any time your symptoms were to reoccur, or fail to resolve completely, please call your primary care physician and/or return to the emergency department.  Thank you for visiting [facility].

## 2016-12-22 NOTE — Progress Notes (Signed)
CM received consult: Medication needs. Pt with health insurance/ prescription coverage ( MEDICAID and MEDICARE), states he has no concerns / issues regarding medications prescribed. States he can afford medications prescribed. Whitman Hero RN,BSN,CM

## 2016-12-22 NOTE — Progress Notes (Signed)
   Subjective: Patient was lying relaxed in his bed today upon entering the room. He denied complaints, chest pain, abdominal pain, nausea, vomiting, headache diarrhea, constipation or dysuria. Patient stated that he would like to have any procedure done this morning if it were at all possible. He was informed that we need to await nephrology's input before proceeding.   Objective:  Vital signs in last 24 hours: Vitals:   12/21/16 1548 12/21/16 2206 12/21/16 2241 12/22/16 0637  BP: 131/74  136/90 (!) 149/87  Pulse: 61 73 69 60  Resp: 20 18 18 18   Temp: (!) 97.3 F (36.3 C)  98.4 F (36.9 C) 98.3 F (36.8 C)  TempSrc: Oral  Oral Oral  SpO2: 100% 99% 93% 95%  Weight: 189 lb 9.6 oz (86 kg)     Height: 6\' 2"  (1.88 m)      ROS negative except as per HPI.  Physical Exam  Constitutional: He appears well-developed. No distress.  Cardiovascular: Normal rate and normal heart sounds.   No murmur heard. Pulmonary/Chest: Effort normal and breath sounds normal. No respiratory distress. He has no wheezes.  Abdominal: Soft. Bowel sounds are normal. He exhibits no distension. There is no tenderness.  Musculoskeletal: He exhibits no edema or tenderness.  Neurological: He is alert.    Assessment/Plan:  Principal Problem:   COPD with acute exacerbation (HCC) Active Problems:   Tobacco abuse   CAD (coronary artery disease)   Pleuritic chest pain   Chronic anticoagulation  COPD exacerbation: Patient feeling better still concerning for significant pulmonary wheezing Plan to continue treating with prednisone 40 mg Continue duo nebs every 4 when necessary [place] 2 times daily And Mucinex 2 times daily Plan to discharge home today  CAD s/p NSTEMI w/ bare metal stent Continue home lipitor 40mg  Continue eliquis  Dispo: Anticipated discharge in approximately 0 day(s).   Kathi Ludwig, MD 12/22/2016, 6:43 AM Pager: Pager# [phone]

## 2016-12-25 ENCOUNTER — Inpatient Hospital Stay: Payer: Medicare Other

## 2016-12-26 ENCOUNTER — Telehealth: Payer: Self-pay

## 2016-12-26 NOTE — Telephone Encounter (Signed)
Attempted to contact the patient to discuss follow up medical care after his hospitalization, including the role of the [facility]. Call placed to # [phone] (M) and a HIPAA compliant voicemail message was left requesting a call back to # [phone]

## 2016-12-27 ENCOUNTER — Telehealth: Payer: Self-pay

## 2016-12-27 NOTE — Telephone Encounter (Signed)
[facility] Post-discharge Follow-Up Phone Call:  Date of Discharge:12/22/2016 Principal Discharge Diagnosis(es):  COPD exacerbation, CAD Post-discharge Communication: (Clearly document all attempts clearly and date contact made) call placed to the patient Call Completed: Yes                     With Whom: Patient Interpreter Needed: no     Please check all that apply:  X Patient is knowledgeable of his/her condition(s) and/or treatment. X  Patient is caring for self at home.  His house mate provides some assistance if needed. He uses DSS transportation for his medical appointments.  ? Patient is receiving assist at home from family and/or caregiver. Family and/or caregiver is knowledgeable of patient's condition(s) and/or treatment. ? Patient is receiving home health services. If so, name of agency.     Medication Reconciliation:  ? Medication list reviewed with patient. X  Patient obtained all discharge medications. If not, why? He has not obtained his medications but said he would get what he needs today. He did not want to review his medication list and said he would take care of everything.  Informed him that he needs to start taking atorvastatin which he needs to pick up. He is to stop taking the hydrocodone.  He said that he needs to pick up prednisone at the pharmacy also, noting that he took the last pills today.  He also needs to get the eliquis refilled. Stressed the importance of picking up the medications as soon as possible and taking them as ordered.  He confirmed that he has a nebulizer but needs to get more albuterol solution.    Activities of Daily Living:  X  Independent - no assistive device needed ? Needs assist (describe; ? home DME used) ? [facility] (describe, ? home DME used)   Community resources in place for patient:  X  None  ? Home Health/Home DME ? Assisted Living ? Support Group          Patient Education: he stated that he has  medicare and medicaid and has no problems affording his prescriptions. He also noted that he is enrolled with medicaid for transportation.  He does not use [employer]. He said that friends will drive him to the grocery store and to do errands.   He receives disability and food stamps ( $135/month)           Questions/Concerns discussed:  He did not have any questions and stated that overall he " feels great."  No signs of bleeding reported.  He was agreeable to scheduling an appointment with the [facility] and an appointment was scheduled for 01/08/17 @ 1400.

## 2016-12-29 ENCOUNTER — Telehealth: Payer: Self-pay | Admitting: Family Medicine

## 2016-12-29 NOTE — Telephone Encounter (Signed)
Call placed to patient [phone] in regards to checking on his status and to find out if he was able to pick up his medication. No answer. Left patient a message to return my call at [phone].

## 2017-01-08 ENCOUNTER — Inpatient Hospital Stay: Payer: Medicare Other | Admitting: Family Medicine

## 2017-01-12 ENCOUNTER — Telehealth: Payer: Self-pay | Admitting: Family Medicine

## 2017-01-12 NOTE — Telephone Encounter (Signed)
Call placed to patient [phone] in regards to his medication and checking on his status. Spoke with patient and he informed that he's been "up and down". When asked if it was a bad thing, patient stated no. Asked patient if he was in pain and he said "no". Patient informed me that he hadn't picked up his medication. He had to help some people this month and therefore he can't afford it. He will try to get them after 11/3. Offered patient to have his medication transferred to our pharmacy, that way co pay can be waived or put into an account but patient said "no, rather keep it where its at". Reminded patient of appointment on 10/23 at 9:45am. Patient understood and had no further questions or concerns.

## 2017-01-17 ENCOUNTER — Inpatient Hospital Stay: Payer: Medicare Other | Admitting: Family Medicine

## 2017-01-19 ENCOUNTER — Telehealth: Payer: Self-pay | Admitting: Family Medicine

## 2017-01-19 NOTE — Telephone Encounter (Signed)
Call placed to patient [phone] to check on his status and reschedule his appointment. No answer. Left patient a message asking him to return my call at [phone].

## 2017-02-02 ENCOUNTER — Encounter: Payer: Self-pay | Admitting: Family Medicine

## 2017-02-02 ENCOUNTER — Ambulatory Visit: Payer: Medicare Other | Attending: Family Medicine | Admitting: Family Medicine

## 2017-02-02 VITALS — BP 138/84 | HR 69 | Temp 97.8°F | Ht 74.0 in | Wt 194.4 lb

## 2017-02-02 DIAGNOSIS — I252 Old myocardial infarction: Secondary | ICD-10-CM | POA: Diagnosis not present

## 2017-02-02 DIAGNOSIS — Z79899 Other long term (current) drug therapy: Secondary | ICD-10-CM | POA: Insufficient documentation

## 2017-02-02 DIAGNOSIS — Z720 Tobacco use: Secondary | ICD-10-CM | POA: Insufficient documentation

## 2017-02-02 DIAGNOSIS — J328 Other chronic sinusitis: Secondary | ICD-10-CM | POA: Diagnosis not present

## 2017-02-02 DIAGNOSIS — Z7952 Long term (current) use of systemic steroids: Secondary | ICD-10-CM | POA: Diagnosis not present

## 2017-02-02 DIAGNOSIS — I209 Angina pectoris, unspecified: Secondary | ICD-10-CM

## 2017-02-02 DIAGNOSIS — I5032 Chronic diastolic (congestive) heart failure: Secondary | ICD-10-CM | POA: Insufficient documentation

## 2017-02-02 DIAGNOSIS — I110 Hypertensive heart disease with heart failure: Secondary | ICD-10-CM | POA: Diagnosis not present

## 2017-02-02 DIAGNOSIS — E7800 Pure hypercholesterolemia, unspecified: Secondary | ICD-10-CM | POA: Diagnosis not present

## 2017-02-02 DIAGNOSIS — J449 Chronic obstructive pulmonary disease, unspecified: Secondary | ICD-10-CM | POA: Insufficient documentation

## 2017-02-02 DIAGNOSIS — M25571 Pain in right ankle and joints of right foot: Secondary | ICD-10-CM | POA: Diagnosis not present

## 2017-02-02 DIAGNOSIS — Z955 Presence of coronary angioplasty implant and graft: Secondary | ICD-10-CM | POA: Diagnosis not present

## 2017-02-02 DIAGNOSIS — M545 Low back pain, unspecified: Secondary | ICD-10-CM

## 2017-02-02 DIAGNOSIS — R5383 Other fatigue: Secondary | ICD-10-CM | POA: Diagnosis not present

## 2017-02-02 DIAGNOSIS — G629 Polyneuropathy, unspecified: Secondary | ICD-10-CM | POA: Diagnosis not present

## 2017-02-02 DIAGNOSIS — Z760 Encounter for issue of repeat prescription: Secondary | ICD-10-CM | POA: Diagnosis not present

## 2017-02-02 DIAGNOSIS — I25119 Atherosclerotic heart disease of native coronary artery with unspecified angina pectoris: Secondary | ICD-10-CM

## 2017-02-02 DIAGNOSIS — Z86711 Personal history of pulmonary embolism: Secondary | ICD-10-CM

## 2017-02-02 DIAGNOSIS — J438 Other emphysema: Secondary | ICD-10-CM | POA: Diagnosis not present

## 2017-02-02 DIAGNOSIS — J329 Chronic sinusitis, unspecified: Secondary | ICD-10-CM | POA: Diagnosis not present

## 2017-02-02 DIAGNOSIS — Z7901 Long term (current) use of anticoagulants: Secondary | ICD-10-CM | POA: Diagnosis not present

## 2017-02-02 DIAGNOSIS — Z86718 Personal history of other venous thrombosis and embolism: Secondary | ICD-10-CM | POA: Insufficient documentation

## 2017-02-02 MED ORDER — APIXABAN 5 MG PO TABS: 5.0000 mg | ORAL_TABLET | Freq: Two times a day (BID) | ORAL | 3 refills | Status: DC

## 2017-02-02 MED ORDER — GABAPENTIN 300 MG PO CAPS: 300.0000 mg | ORAL_CAPSULE | Freq: Two times a day (BID) | ORAL | 3 refills | Status: DC

## 2017-02-02 MED ORDER — DICLOFENAC SODIUM 1 % TD GEL: 4.0000 g | Freq: Four times a day (QID) | TRANSDERMAL | 1 refills | Status: DC

## 2017-02-02 MED ORDER — FLUTICASONE-SALMETEROL 250-50 MCG/DOSE IN AEPB: 1.0000 | INHALATION_SPRAY | Freq: Two times a day (BID) | RESPIRATORY_TRACT | 3 refills | Status: DC

## 2017-02-02 MED ORDER — ATORVASTATIN CALCIUM 40 MG PO TABS
40.0000 mg | ORAL_TABLET | Freq: Every day | ORAL | 3 refills | Status: DC
Start: 2017-02-02 — End: 2017-04-24

## 2017-02-02 MED ORDER — TIZANIDINE HCL 4 MG PO TABS: 4.0000 mg | ORAL_TABLET | Freq: Three times a day (TID) | ORAL | 1 refills | Status: DC | PRN

## 2017-02-02 MED ORDER — ALBUTEROL SULFATE (2.5 MG/3ML) 0.083% IN NEBU: 2.5000 mg | INHALATION_SOLUTION | Freq: Four times a day (QID) | RESPIRATORY_TRACT | 3 refills | Status: DC | PRN

## 2017-02-02 MED ORDER — VENTOLIN HFA 108 (90 BASE) MCG/ACT IN AERS: 2.0000 | INHALATION_SPRAY | Freq: Four times a day (QID) | RESPIRATORY_TRACT | 1 refills | Status: DC | PRN

## 2017-02-02 NOTE — Progress Notes (Signed)
Subjective:  Patient ID: [name], male    DOB: 1961/01/08  Age: 56 y.o. MRN: [id number]  CC: COPD and Medication Refill   HPI [name] is a 56 year old male with a history of COPD, chronic sinusitis, recurrent DVT and PE (on anticoagulation with Eliquis), coronary artery disease (status post stent, last cardiac cath from 12/2014 revealed two-vessel CAD, medical management recommended), tobacco abuse who presents today for a follow-up visit.  In 11/2016 he had a hospitalization for a COPD exacerbation for which he was discharged on a prednisone taper and reports doing well; denies shortness of breath, wheezing and has been compliant with his inhalers.  From a cardiac standpoint he denies chest pains and has a good exercise tolerance.  He complains of right ankle pain ever since he bumped the lateral aspect of his right foot against a table, 2 weeks ago; he has noticed associated swelling and does not take anything for pain. He does have reduced energy and his last CBC was negative for anemia.  He also complains of right-sided low back pain which does not radiate down his extremities and is worse when he sleeps on his left side.  Pain has been present for several years and is described as mild.  Compliant with Eliquis and denies bleeding from gums, hematuria, hematochezia, hematemesis.  Past Medical History:  Diagnosis Date  . Asthma   . CAD (coronary artery disease)    a. 09/2013 NSTEMI/PCI: LM nl, LAD 40-50%, D1 100 (2.25 x 28 Vision BMS), LCX min irregs, RI 60-70, 30, RCA 40-50/50-32ms/p, EF 45-50%.  b. cath 12/2014 -occulded BMS in diag, 50% pro to mid LAD, 60% ramus, 30% RCA   . Chest pain 08/2015  . Chronic diastolic CHF (congestive heart failure) ([place])    a. 09/2014 EF 45-50% by LV gram;  b. 01/2014 Echo: EF 55-60%, Gr 1 DD.  Marland Kitchen Cocaine abuse ([place])   . COPD (chronic obstructive pulmonary disease) ([place])   . DVT (deep venous thrombosis) ([place])    "I had ~ 10 in each leg"  (12/20/2016)  . Essential hypertension   . Hepatitis C    "clear free over a year now"  . High cholesterol   . Myocardial infarction ([place]) ~ 2014  . Osteoarthritis    a. hands and toes.  . Pneumonia    "several times" (12/20/2016)  . Pulmonary embolism ([place])    a. 2012 - s/p IVC filter;  b. prev on eliquis - noncompliant.  . Shortness of breath dyspnea   . Sinus headache   . Squamous cell skin cancer 07/13/2016   "foot"  . Tobacco abuse     Past Surgical History:  Procedure Laterality Date  . CORONARY ANGIOPLASTY WITH STENT PLACEMENT  10/21/13   BMS to D1  . FOOT SURGERY Right ~ 06/2016   "said there was skin cancer on it"  . VENA CAVA FILTER PLACEMENT  ~ 2007-~ 2017   "1 in my neck; 2 in my wrist"    Allergies  Allergen Reactions  . No Known Allergies      Outpatient Medications Prior to Visit  Medication Sig Dispense Refill  . acetaminophen (TYLENOL) 325 MG tablet Take 650 mg by mouth every 6 (six) hours as needed for mild pain.    . pantoprazole (PROTONIX) 40 MG tablet   3  . predniSONE (DELTASONE) 20 MG tablet Take 2 tablets (40 mg total) by mouth daily with breakfast. 6 tablet 0  . albuterol (PROVENTIL) (2.5 MG/3ML) 0.083%  nebulizer solution Take 3 mLs (2.5 mg total) by nebulization every 6 (six) hours as needed for wheezing or shortness of breath. 150 mL 1  . apixaban (ELIQUIS) 5 MG TABS tablet Take 1 tablet (5 mg total) by mouth 2 (two) times daily. 60 tablet 3  . atorvastatin (LIPITOR) 40 MG tablet Take 1 tablet (40 mg total) by mouth daily at 6 PM. 30 tablet 2  . Fluticasone-Salmeterol (ADVAIR DISKUS) 250-50 MCG/DOSE AEPB Inhale 1 puff into the lungs 2 (two) times daily. 1 each 3  . VENTOLIN HFA 108 (90 Base) MCG/ACT inhaler Inhale 2 puffs into the lungs every 6 (six) hours as needed for wheezing or shortness of breath. 1 Inhaler 1   No facility-administered medications prior to visit.     ROS Review of Systems  Constitutional: Negative for activity change and  appetite change.  HENT: Negative for sinus pressure and sore throat.   Eyes: Negative for visual disturbance.  Respiratory: Negative for cough, chest tightness and shortness of breath.   Cardiovascular: Negative for chest pain and leg swelling.  Gastrointestinal: Negative for abdominal distention, abdominal pain, constipation and diarrhea.  Endocrine: Negative.   Genitourinary: Negative for dysuria.  Musculoskeletal:       See hpi  Skin: Negative for rash.  Allergic/Immunologic: Negative.   Neurological: Negative for weakness, light-headedness and numbness.  Psychiatric/Behavioral: Negative for dysphoric mood and suicidal ideas.    Objective:  BP 138/84   Pulse 69   Temp 97.8 F (36.6 C) (Oral)   Ht 6\' 2"  (1.88 m)   Wt 194 lb 6.4 oz (88.2 kg)   SpO2 100%   BMI 24.96 kg/m   BP/Weight 02/02/2017 12/22/2016 4/00/8676  Systolic BP 195 093 -  Diastolic BP 84 87 -  Wt. (Lbs) 194.4 - 189.6  BMI 24.96 - 24.34      Physical Exam  Constitutional: He is oriented to person, place, and time. He appears well-developed and well-nourished.  Cardiovascular: Normal rate, normal heart sounds and intact distal pulses.  No murmur heard. Pulmonary/Chest: Effort normal and breath sounds normal. He has no wheezes. He has no rales. He exhibits no tenderness.  Abdominal: Soft. Bowel sounds are normal. He exhibits no distension and no mass. There is no tenderness.  Musculoskeletal: Normal range of motion. He exhibits edema (edema of right lateral malleolus) and tenderness (mild tenderness of R lateral malleolus on palpation and ROM).  Neurological: He is alert and oriented to person, place, and time.  Psychiatric: He has a normal mood and affect.     CMP Latest Ref Rng & Units 12/20/2016 12/18/2016 08/03/2016  Glucose 65 - 99 mg/dL 260(H) 110(H) 106(H)  BUN 6 - 20 mg/dL 19 8 12   Creatinine 0.61 - 1.24 mg/dL 0.80 0.95 0.87  Sodium 135 - 145 mmol/L 138 138 142  Potassium 3.5 - 5.1 mmol/L 4.3 4.4  4.6  Chloride 101 - 111 mmol/L 108 107 101  CO2 22 - 32 mmol/L - 25 23  Calcium 8.9 - 10.3 mg/dL - 9.4 9.5  Total Protein 6.0 - 8.5 g/dL - - 7.1  Total Bilirubin 0.0 - 1.2 mg/dL - - 0.3  Alkaline Phos 39 - 117 IU/L - - 124(H)  AST 0 - 40 IU/L - - 44(H)  ALT 0 - 44 IU/L - - 35    Lipid Panel     Component Value Date/Time   CHOL 206 (H) 08/03/2016 1244   TRIG 98 08/03/2016 1244   HDL 57 08/03/2016 1244  CHOLHDL 3.6 08/03/2016 1244   CHOLHDL 3.1 01/01/2015 0208   VLDL 12 01/01/2015 0208   LDLCALC 129 (H) 08/03/2016 1244    Assessment & Plan:   1. Other emphysema (HCC) Controlled - albuterol (PROVENTIL) (2.5 MG/3ML) 0.083% nebulizer solution; Take 3 mLs (2.5 mg total) every 6 (six) hours as needed by nebulization for wheezing or shortness of breath.  Dispense: 75 mL; Refill: 3 - Fluticasone-Salmeterol (ADVAIR DISKUS) 250-50 MCG/DOSE AEPB; Inhale 1 puff 2 (two) times daily into the lungs.  Dispense: 1 each; Refill: 3 - VENTOLIN HFA 108 (90 Base) MCG/ACT inhaler; Inhale 2 puffs every 6 (six) hours as needed into the lungs for wheezing or shortness of breath.  Dispense: 1 Inhaler; Refill: 1  2. Neuropathy Stable - gabapentin (NEURONTIN) 300 MG capsule; Take 1 capsule (300 mg total) 2 (two) times daily by mouth.  Dispense: 60 capsule; Refill: 3  3. Other fatigue Last CBC was normal Might need to check vitamin D if symptoms persist - Testosterone Total,Free,Bio, Males  4. Coronary artery disease involving native coronary artery of native heart with angina pectoris (HCC) No angina at this time Risk factor modification - atorvastatin (LIPITOR) 40 MG tablet; Take 1 tablet (40 mg total) daily at 6 PM by mouth.  Dispense: 30 tablet; Refill: 3  5. Acute right ankle pain Secondary to trauma Oral NSAIDs are contraindicated due to his being on an anticoagulant - diclofenac sodium (VOLTAREN) 1 % GEL; Apply 4 g 4 (four) times daily topically.  Dispense: 100 g; Refill: 1  6. Acute  right-sided low back pain without sciatica Could be of muscular etiology Placed on Tizanidine  7. Other chronic sinusitis Doing well on Flonase and Zyrtec  8. History of pulmonary embolism Will remain on chronic anticoagulation due to history of multiple DVTs and PEs - apixaban (ELIQUIS) 5 MG TABS tablet; Take 1 tablet (5 mg total) 2 (two) times daily by mouth.  Dispense: 60 tablet; Refill: 3   Meds ordered this encounter  Medications  . albuterol (PROVENTIL) (2.5 MG/3ML) 0.083% nebulizer solution    Sig: Take 3 mLs (2.5 mg total) every 6 (six) hours as needed by nebulization for wheezing or shortness of breath.    Dispense:  75 mL    Refill:  3  . apixaban (ELIQUIS) 5 MG TABS tablet    Sig: Take 1 tablet (5 mg total) 2 (two) times daily by mouth.    Dispense:  60 tablet    Refill:  3  . atorvastatin (LIPITOR) 40 MG tablet    Sig: Take 1 tablet (40 mg total) daily at 6 PM by mouth.    Dispense:  30 tablet    Refill:  3  . Fluticasone-Salmeterol (ADVAIR DISKUS) 250-50 MCG/DOSE AEPB    Sig: Inhale 1 puff 2 (two) times daily into the lungs.    Dispense:  1 each    Refill:  3  . VENTOLIN HFA 108 (90 Base) MCG/ACT inhaler    Sig: Inhale 2 puffs every 6 (six) hours as needed into the lungs for wheezing or shortness of breath.    Dispense:  1 Inhaler    Refill:  1  . gabapentin (NEURONTIN) 300 MG capsule    Sig: Take 1 capsule (300 mg total) 2 (two) times daily by mouth.    Dispense:  60 capsule    Refill:  3  . diclofenac sodium (VOLTAREN) 1 % GEL    Sig: Apply 4 g 4 (four) times daily topically.  Dispense:  100 g    Refill:  1    Follow-up: Return in about 3 months (around 05/05/2017) for follow up of chronic medical conditions.   Arnoldo Morale MD

## 2017-02-02 NOTE — Patient Instructions (Signed)
Focal Neuropathy  Focal neuropathy is a nerve injury that affects one area of the body, such as an arm, a leg, or the face. The injury may involve one nerve or a small group of nerves. Examples of focal neuropathy include brachial plexus injury and [place]-Turner syndrome.  What are the causes?  This condition may be caused by:   A sudden cut or stretch. This can happen because of an accident or during surgery.   A small injury that happens again and again.   A compression injury. This kind of injury happens when a blood vessel, a tumor, or something else presses on a nerve for a long time.   Entrapment. This can happen to a nerve that has to pass through a narrow area.   Lack of blood to the nerves. This can be caused by certain medical conditions, like diabetes or vasculitis.   Extreme cold.   Severe burns.   Radiation.    What are the signs or symptoms?  Symptoms of this condition depend on where the damaged nerve is and what kind of nerve it is. For example, damage to nerves that carry signals away from the brain can cause symptoms related to movement, but damage to nerves that carry signals to the brain can cause symptoms related to feeling and pain.  Symptoms affect only one area of the body. Common symptoms include:   Numbness.   Tingling.   A burning pain.   A prickling feeling.   Very sensitive skin.   Weakness.   Paralysis.   Muscle twitching.   Muscles getting smaller (muscle wasting).   Poor coordination.    How is this diagnosed?  This condition may be diagnosed with:   A neurologic exam. During the exam, your health care provider will check your reflexes, how you move, and what you can feel.   Tests. Tests may be done to help find the area that has been damaged. Tests may include:  ? Imaging tests, such as a CT scan or MRI.  ? Electromyogram (EMG). This test checks the nerves that control your muscles.  ? Nerve conduction velocity (NCV) tests. These tests check how quickly  messages pass through your nerves.    How is this treated?  Treatment for this condition depends on what damaged the nerve and how much of the nerve is damaged. Treatment may involve:   Surgery to ease pressure on a nerve. This may be done if you have a compression injury or entrapment.   Medicines, such as:  ? Medicines for pain, such painkillers, certain anti-seizure medicines, or antidepressants.  ? Steroid medicines. These may be given in pill form or with a shot (injection).   Physical therapy (PT) to help movement.   Splints or other devices to help movement.    Follow these instructions at home:     Take over-the-counter and prescription medicines only as told by your health care provider.   If you were given a splint or other device to help with movement, use it as told by your health care provider.   If any part of your body is numb, check it every day for signs of injury or infection. Watch for:  ? Redness, swelling, or pain.  ? Fluid or blood.  ? Warmth.  ? Pus or a bad smell.   Do not do things that put pressure on your damaged nerve. You may have to avoid certain activities or avoid sitting or lying in a certain way.     Do not smoke. Smoking keeps blood from getting to damaged nerves. If you need help quitting, ask your health care provider.   Limit alcohol intake, or avoid alcohol completely. Too much alcohol can cause a lack of B vitamins, which are needed for healthy nerves.   Have a good support system. Coping with focal neuropathy can be stressful. Talk with a mental health caregiver or join a support group if you are struggling.   Keep all follow-up visits as told by your health care provider. This is important. These include PT visits.  Contact a health care provider if:   You think you have an injury or infection.   You have new symptoms.   You are struggling emotionally from dealing with your condition.  Get help right away if:   You have severe pain that comes on  suddenly.   You develop any paralysis.   You lose feeling in any part of your body.  This information is not intended to replace advice given to you by your health care provider. Make sure you discuss any questions you have with your health care provider.  Document Released: 02/22/2015 Document Revised: 08/19/2015 Document Reviewed: 10/09/2014  [employer] Interactive Patient Education  2018 [employer]

## 2017-02-04 LAB — TESTOSTERONE, FREE, TOTAL, SHBG
Sex Hormone Binding: 128.2 nmol/L — ABNORMAL HIGH (ref 19.3–76.4)
TESTOSTERONE FREE: 0.8 pg/mL — AB (ref 7.2–24.0)
TESTOSTERONE: 264 ng/dL (ref 264–916)

## 2017-02-05 ENCOUNTER — Telehealth: Payer: Self-pay

## 2017-02-05 NOTE — Telephone Encounter (Signed)
Pt was called and informed of lab results. 

## 2017-04-05 ENCOUNTER — Encounter (HOSPITAL_COMMUNITY): Payer: Self-pay

## 2017-04-05 ENCOUNTER — Other Ambulatory Visit: Payer: Self-pay

## 2017-04-05 ENCOUNTER — Inpatient Hospital Stay (HOSPITAL_COMMUNITY)
Admission: EM | Admit: 2017-04-05 | Discharge: 2017-04-11 | DRG: 191 | Disposition: A | Discharge status: Home / Self Care | Payer: Medicare Other | Attending: Internal Medicine | Admitting: Internal Medicine

## 2017-04-05 ENCOUNTER — Emergency Department (HOSPITAL_COMMUNITY): Payer: Medicare Other

## 2017-04-05 DIAGNOSIS — I5032 Chronic diastolic (congestive) heart failure: Secondary | ICD-10-CM | POA: Diagnosis present

## 2017-04-05 DIAGNOSIS — J441 Chronic obstructive pulmonary disease with (acute) exacerbation: Secondary | ICD-10-CM | POA: Diagnosis present

## 2017-04-05 DIAGNOSIS — J45901 Unspecified asthma with (acute) exacerbation: Secondary | ICD-10-CM

## 2017-04-05 DIAGNOSIS — M19041 Primary osteoarthritis, right hand: Secondary | ICD-10-CM | POA: Diagnosis present

## 2017-04-05 DIAGNOSIS — I110 Hypertensive heart disease with heart failure: Secondary | ICD-10-CM | POA: Diagnosis not present

## 2017-04-05 DIAGNOSIS — D649 Anemia, unspecified: Secondary | ICD-10-CM | POA: Diagnosis not present

## 2017-04-05 DIAGNOSIS — Z825 Family history of asthma and other chronic lower respiratory diseases: Secondary | ICD-10-CM

## 2017-04-05 DIAGNOSIS — M19042 Primary osteoarthritis, left hand: Secondary | ICD-10-CM | POA: Diagnosis present

## 2017-04-05 DIAGNOSIS — B182 Chronic viral hepatitis C: Secondary | ICD-10-CM | POA: Diagnosis present

## 2017-04-05 DIAGNOSIS — Z7951 Long term (current) use of inhaled steroids: Secondary | ICD-10-CM

## 2017-04-05 DIAGNOSIS — R062 Wheezing: Secondary | ICD-10-CM | POA: Diagnosis not present

## 2017-04-05 DIAGNOSIS — Z955 Presence of coronary angioplasty implant and graft: Secondary | ICD-10-CM

## 2017-04-05 DIAGNOSIS — Z86718 Personal history of other venous thrombosis and embolism: Secondary | ICD-10-CM

## 2017-04-05 DIAGNOSIS — R0602 Shortness of breath: Secondary | ICD-10-CM | POA: Diagnosis not present

## 2017-04-05 DIAGNOSIS — Z95828 Presence of other vascular implants and grafts: Secondary | ICD-10-CM

## 2017-04-05 DIAGNOSIS — F17210 Nicotine dependence, cigarettes, uncomplicated: Secondary | ICD-10-CM | POA: Diagnosis not present

## 2017-04-05 DIAGNOSIS — I2510 Atherosclerotic heart disease of native coronary artery without angina pectoris: Secondary | ICD-10-CM | POA: Diagnosis not present

## 2017-04-05 DIAGNOSIS — E785 Hyperlipidemia, unspecified: Secondary | ICD-10-CM | POA: Diagnosis present

## 2017-04-05 DIAGNOSIS — E7800 Pure hypercholesterolemia, unspecified: Secondary | ICD-10-CM | POA: Diagnosis present

## 2017-04-05 DIAGNOSIS — Z8249 Family history of ischemic heart disease and other diseases of the circulatory system: Secondary | ICD-10-CM

## 2017-04-05 DIAGNOSIS — Z86711 Personal history of pulmonary embolism: Secondary | ICD-10-CM | POA: Diagnosis present

## 2017-04-05 DIAGNOSIS — I10 Essential (primary) hypertension: Secondary | ICD-10-CM | POA: Diagnosis present

## 2017-04-05 DIAGNOSIS — M19079 Primary osteoarthritis, unspecified ankle and foot: Secondary | ICD-10-CM | POA: Diagnosis present

## 2017-04-05 DIAGNOSIS — Z7901 Long term (current) use of anticoagulants: Secondary | ICD-10-CM

## 2017-04-05 DIAGNOSIS — I5042 Chronic combined systolic (congestive) and diastolic (congestive) heart failure: Secondary | ICD-10-CM | POA: Diagnosis present

## 2017-04-05 DIAGNOSIS — F17290 Nicotine dependence, other tobacco product, uncomplicated: Secondary | ICD-10-CM | POA: Diagnosis present

## 2017-04-05 DIAGNOSIS — I252 Old myocardial infarction: Secondary | ICD-10-CM

## 2017-04-05 LAB — CBC WITH DIFFERENTIAL/PLATELET
BASOS PCT: 0 %
Basophils Absolute: 0 10*3/uL (ref 0.0–0.1)
Eosinophils Absolute: 0.2 10*3/uL (ref 0.0–0.7)
Eosinophils Relative: 5 %
HEMATOCRIT: 34 % — AB (ref 39.0–52.0)
HEMOGLOBIN: 11 g/dL — AB (ref 13.0–17.0)
LYMPHS PCT: 17 %
Lymphs Abs: 0.7 10*3/uL (ref 0.7–4.0)
MCH: 30.3 pg (ref 26.0–34.0)
MCHC: 32.4 g/dL (ref 30.0–36.0)
MCV: 93.7 fL (ref 78.0–100.0)
MONO ABS: 0.2 10*3/uL (ref 0.1–1.0)
MONOS PCT: 4 %
NEUTROS ABS: 3.2 10*3/uL (ref 1.7–7.7)
NEUTROS PCT: 74 %
Platelets: 154 10*3/uL (ref 150–400)
RBC: 3.63 MIL/uL — ABNORMAL LOW (ref 4.22–5.81)
RDW: 14 % (ref 11.5–15.5)
WBC: 4.3 10*3/uL (ref 4.0–10.5)

## 2017-04-05 LAB — I-STAT CHEM 8, ED
BUN: 6 mg/dL (ref 6–20)
Calcium, Ion: 0.95 mmol/L — ABNORMAL LOW (ref 1.15–1.40)
Chloride: 114 mmol/L — ABNORMAL HIGH (ref 101–111)
Creatinine, Ser: 0.6 mg/dL — ABNORMAL LOW (ref 0.61–1.24)
GLUCOSE: 105 mg/dL — AB (ref 65–99)
HCT: 29 % — ABNORMAL LOW (ref 39.0–52.0)
HEMOGLOBIN: 9.9 g/dL — AB (ref 13.0–17.0)
Potassium: 3.2 mmol/L — ABNORMAL LOW (ref 3.5–5.1)
Sodium: 144 mmol/L (ref 135–145)
TCO2: 17 mmol/L — ABNORMAL LOW (ref 22–32)

## 2017-04-05 MED ORDER — IPRATROPIUM BROMIDE 0.02 % IN SOLN
0.5000 mg | RESPIRATORY_TRACT | Status: DC
  Administered 2017-04-06 (×5): 0.5 mg via RESPIRATORY_TRACT
  Filled 2017-04-05 (×5): qty 2.5

## 2017-04-05 MED ORDER — ONDANSETRON HCL 4 MG PO TABS: 4.0000 mg | ORAL_TABLET | Freq: Four times a day (QID) | ORAL | Status: DC | PRN

## 2017-04-05 MED ORDER — PANTOPRAZOLE SODIUM 40 MG PO TBEC
40.0000 mg | DELAYED_RELEASE_TABLET | Freq: Every day | ORAL | Status: DC
  Administered 2017-04-06 – 2017-04-11 (×7): 40 mg via ORAL
  Filled 2017-04-05 (×7): qty 1

## 2017-04-05 MED ORDER — METHYLPREDNISOLONE SODIUM SUCC 40 MG IJ SOLR
40.0000 mg | Freq: Two times a day (BID) | INTRAMUSCULAR | Status: DC
  Administered 2017-04-06: 40 mg via INTRAVENOUS
  Filled 2017-04-05: qty 1

## 2017-04-05 MED ORDER — BENZONATATE 100 MG PO CAPS
200.0000 mg | ORAL_CAPSULE | Freq: Once | ORAL | Status: AC
  Administered 2017-04-05: 200 mg via ORAL
  Filled 2017-04-05: qty 2

## 2017-04-05 MED ORDER — PREDNISONE 20 MG PO TABS
60.0000 mg | ORAL_TABLET | Freq: Once | ORAL | Status: AC
  Administered 2017-04-05: 60 mg via ORAL
  Filled 2017-04-05: qty 3

## 2017-04-05 MED ORDER — METHYLPREDNISOLONE SODIUM SUCC 125 MG IJ SOLR
125.0000 mg | Freq: Once | INTRAMUSCULAR | Status: AC
  Administered 2017-04-05: 125 mg via INTRAVENOUS
  Filled 2017-04-05: qty 2

## 2017-04-05 MED ORDER — SODIUM CHLORIDE 0.9 % IV BOLUS (SEPSIS)
1000.0000 mL | Freq: Once | INTRAVENOUS | Status: AC
  Administered 2017-04-05: 1000 mL via INTRAVENOUS

## 2017-04-05 MED ORDER — HYDROCODONE-ACETAMINOPHEN 5-325 MG PO TABS
1.0000 | ORAL_TABLET | Freq: Once | ORAL | Status: AC
  Administered 2017-04-05: 1 via ORAL
  Filled 2017-04-05: qty 1

## 2017-04-05 MED ORDER — DOXYCYCLINE HYCLATE 100 MG IV SOLR
100.0000 mg | Freq: Two times a day (BID) | INTRAVENOUS | Status: DC
  Administered 2017-04-06 – 2017-04-07 (×3): 100 mg via INTRAVENOUS
  Filled 2017-04-05 (×6): qty 100

## 2017-04-05 MED ORDER — DEXTROSE 5 % IV SOLN
100.0000 mg | Freq: Once | INTRAVENOUS | Status: AC
  Administered 2017-04-05: 100 mg via INTRAVENOUS
  Filled 2017-04-05: qty 100

## 2017-04-05 MED ORDER — ACETAMINOPHEN 650 MG RE SUPP: 650.0000 mg | Freq: Four times a day (QID) | RECTAL | Status: DC | PRN

## 2017-04-05 MED ORDER — IPRATROPIUM-ALBUTEROL 0.5-2.5 (3) MG/3ML IN SOLN
3.0000 mL | Freq: Once | RESPIRATORY_TRACT | Status: AC
  Administered 2017-04-05: 3 mL via RESPIRATORY_TRACT
  Filled 2017-04-05: qty 6

## 2017-04-05 MED ORDER — ONDANSETRON HCL 4 MG/2ML IJ SOLN: 4.0000 mg | Freq: Four times a day (QID) | INTRAMUSCULAR | Status: DC | PRN

## 2017-04-05 MED ORDER — ALBUTEROL (5 MG/ML) CONTINUOUS INHALATION SOLN
10.0000 mg/h | INHALATION_SOLUTION | Freq: Once | RESPIRATORY_TRACT | Status: AC
  Administered 2017-04-05: 10 mg/h via RESPIRATORY_TRACT
  Filled 2017-04-05: qty 20

## 2017-04-05 MED ORDER — BUDESONIDE 0.25 MG/2ML IN SUSP
0.2500 mg | Freq: Two times a day (BID) | RESPIRATORY_TRACT | Status: DC
  Administered 2017-04-06 – 2017-04-08 (×5): 0.25 mg via RESPIRATORY_TRACT
  Filled 2017-04-05 (×6): qty 2

## 2017-04-05 MED ORDER — APIXABAN 5 MG PO TABS
5.0000 mg | ORAL_TABLET | Freq: Two times a day (BID) | ORAL | Status: DC
  Administered 2017-04-06 – 2017-04-11 (×13): 5 mg via ORAL
  Filled 2017-04-05 (×13): qty 1

## 2017-04-05 MED ORDER — FLUTICASONE PROPIONATE 50 MCG/ACT NA SUSP
2.0000 | Freq: Two times a day (BID) | NASAL | Status: DC
  Administered 2017-04-06 – 2017-04-10 (×9): 2 via NASAL
  Filled 2017-04-05 (×2): qty 16

## 2017-04-05 MED ORDER — ALBUTEROL (5 MG/ML) CONTINUOUS INHALATION SOLN
10.0000 mg/h | INHALATION_SOLUTION | Freq: Once | RESPIRATORY_TRACT | Status: AC
  Administered 2017-04-05: 10 mg/h via RESPIRATORY_TRACT

## 2017-04-05 MED ORDER — ALBUTEROL SULFATE (2.5 MG/3ML) 0.083% IN NEBU: 2.5000 mg | INHALATION_SOLUTION | RESPIRATORY_TRACT | Status: DC | PRN

## 2017-04-05 MED ORDER — MAGNESIUM SULFATE 2 GM/50ML IV SOLN
2.0000 g | Freq: Once | INTRAVENOUS | Status: AC
  Administered 2017-04-05: 2 g via INTRAVENOUS
  Filled 2017-04-05: qty 50

## 2017-04-05 MED ORDER — ALBUTEROL SULFATE (2.5 MG/3ML) 0.083% IN NEBU
2.5000 mg | INHALATION_SOLUTION | RESPIRATORY_TRACT | Status: DC
  Administered 2017-04-06 (×2): 2.5 mg via RESPIRATORY_TRACT
  Filled 2017-04-05 (×3): qty 3

## 2017-04-05 MED ORDER — ATORVASTATIN CALCIUM 40 MG PO TABS
40.0000 mg | ORAL_TABLET | Freq: Every day | ORAL | Status: DC
  Administered 2017-04-06 – 2017-04-10 (×7): 40 mg via ORAL
  Filled 2017-04-05 (×8): qty 1

## 2017-04-05 MED ORDER — ACETAMINOPHEN 325 MG PO TABS
650.0000 mg | ORAL_TABLET | Freq: Four times a day (QID) | ORAL | Status: DC | PRN
  Administered 2017-04-06: 650 mg via ORAL
  Filled 2017-04-05: qty 2

## 2017-04-05 MED ORDER — GABAPENTIN 300 MG PO CAPS
300.0000 mg | ORAL_CAPSULE | Freq: Two times a day (BID) | ORAL | Status: DC
  Administered 2017-04-06 – 2017-04-11 (×12): 300 mg via ORAL
  Filled 2017-04-05 (×12): qty 1

## 2017-04-05 MED ORDER — ALBUTEROL SULFATE HFA 108 (90 BASE) MCG/ACT IN AERS: 1.0000 | INHALATION_SPRAY | Freq: Four times a day (QID) | RESPIRATORY_TRACT | Status: DC | PRN

## 2017-04-05 NOTE — Progress Notes (Signed)
RRT entered room to start another CAT nebulizer and the patient stated that he wasn't going to do it at that time to come back in 30 minutes.  RRT will continue to monitor.

## 2017-04-05 NOTE — ED Provider Notes (Signed)
[facility] EMERGENCY DEPARTMENT Provider Note   CSN: [id number] Arrival date & time: 04/05/17  1621     History   Chief Complaint Chief Complaint  Patient presents with  . Asthma    HPI [name] is a 57 y.o. male.  The history is provided by the patient.  Asthma  This is a new problem. The current episode started more than 1 week ago. The problem occurs constantly. The problem has been gradually worsening. Associated symptoms include shortness of breath. Pertinent negatives include no chest pain, no abdominal pain and no headaches. The symptoms are aggravated by coughing. Nothing relieves the symptoms. Treatments tried: home inhalers. The treatment provided mild relief.    Past Medical History:  Diagnosis Date  . Asthma   . CAD (coronary artery disease)    a. 09/2013 NSTEMI/PCI: LM nl, LAD 40-50%, D1 100 (2.25 x 28 Vision BMS), LCX min irregs, RI 60-70, 30, RCA 40-50/50-53ms/p, EF 45-50%.  b. cath 12/2014 -occulded BMS in diag, 50% pro to mid LAD, 60% ramus, 30% RCA   . Chest pain 08/2015  . Chronic diastolic CHF (congestive heart failure) ([place])    a. 09/2014 EF 45-50% by LV gram;  b. 01/2014 Echo: EF 55-60%, Gr 1 DD.  Marland Kitchen Cocaine abuse ([place])   . COPD (chronic obstructive pulmonary disease) ([place])   . DVT (deep venous thrombosis) ([place])    "I had ~ 10 in each leg" (12/20/2016)  . Essential hypertension   . Hepatitis C    "clear free over a year now"  . High cholesterol   . Myocardial infarction ([place]) ~ 2014  . Osteoarthritis    a. hands and toes.  . Pneumonia    "several times" (12/20/2016)  . Pulmonary embolism ([place])    a. 2012 - s/p IVC filter;  b. prev on eliquis - noncompliant.  . Shortness of breath dyspnea   . Sinus headache   . Squamous cell skin cancer 07/13/2016   "foot"  . Tobacco abuse     Patient Active Problem List   Diagnosis Date Noted  . COPD exacerbation ([place]) 04/05/2017  . Chronic sinusitis 02/02/2017  . COPD with acute  exacerbation ([place]) 12/20/2016  . History of squamous cell carcinoma 08/03/2016  . Psoriasis 08/03/2016  . Squamous cell skin cancer 07/13/2016  . Chronic anticoagulation 02/01/2016  . Malnutrition of moderate degree 06/05/2015  . Liver fibrosis 04/08/2015  . COPD (chronic obstructive pulmonary disease) ([place]) 01/01/2015  . Essential hypertension 01/01/2015  . Unstable angina ([place]) 01/01/2015  . History of coronary artery disease   . Cocaine abuse ([place]) 12/01/2014  . Pleuritic chest pain 12/01/2014  . Chronic diastolic heart failure ([place]) 12/01/2014  . QT prolongation 12/01/2014  . Chronic hepatitis C without hepatic coma ([place]) 06/18/2014  . HTN (hypertension) 04/27/2014  . Coronary artery disease involving native coronary artery of native heart without angina pectoris   . CAD (coronary artery disease) 10/21/2013  . NSTEMI (non-ST elevated myocardial infarction) ([place]) 10/21/2013  . Anemia 06/20/2013  . Regular alcohol consumption 06/18/2013  . History of DVT (deep vein thrombosis) 02/21/2013  . Thrombocytopenia ([place]) 08/09/2012  . Tobacco abuse 08/09/2012  . History of noncompliance with medical treatment 03/24/2012  . Atopic dermatitis 03/24/2012  . History of pulmonary embolism 02/10/2012    Past Surgical History:  Procedure Laterality Date  . CARDIAC CATHETERIZATION N/A 01/04/2015   Procedure: Left Heart Cath and Coronary Angiography;  Surgeon: Burnell Blanks, MD;  Location: [facility]  CV LAB;  Service: Cardiovascular;  Laterality: N/A;  . CORONARY ANGIOPLASTY WITH STENT PLACEMENT  10/21/13   BMS to D1  . CYST EXCISION N/A 05/10/2016   Procedure: EXCISION OF SEBACEOUS CYST UPPER BACK;  Surgeon: Donnie Mesa, MD;  Location: [facility];  Service: General;  Laterality: N/A;  . FOOT SURGERY Right ~ 06/2016   "said there was skin cancer on it"  . LEFT HEART CATHETERIZATION WITH CORONARY ANGIOGRAM N/A 10/21/2013   Procedure: LEFT HEART CATHETERIZATION WITH CORONARY ANGIOGRAM;   Surgeon: Leonie Man, MD;  Location: [facility] CATH LAB;  Service: Cardiovascular;  Laterality: N/A;  . VENA CAVA FILTER PLACEMENT  ~ 2007-~ 2017   "1 in my neck; 2 in my wrist"       Home Medications    Prior to Admission medications   Medication Sig Start Date End Date Taking? Authorizing Provider  acetaminophen (TYLENOL) 325 MG tablet Take 650 mg by mouth every 6 (six) hours as needed for mild pain.   Yes [provider]  albuterol (PROVENTIL) (2.5 MG/3ML) 0.083% nebulizer solution Take 3 mLs (2.5 mg total) every 6 (six) hours as needed by nebulization for wheezing or shortness of breath. 02/02/17  Yes Arnoldo Morale, MD  apixaban (ELIQUIS) 5 MG TABS tablet Take 1 tablet (5 mg total) 2 (two) times daily by mouth. 02/02/17  Yes Arnoldo Morale, MD  atorvastatin (LIPITOR) 40 MG tablet Take 1 tablet (40 mg total) daily at 6 PM by mouth. 02/02/17  Yes Arnoldo Morale, MD  diclofenac sodium (VOLTAREN) 1 % GEL Apply 4 g 4 (four) times daily topically. 02/02/17  Yes Arnoldo Morale, MD  fluticasone (FLONASE) 50 MCG/ACT nasal spray Place 2 sprays into both nostrils 2 (two) times daily. 03/30/17  Yes [provider]  Fluticasone-Salmeterol (ADVAIR DISKUS) 250-50 MCG/DOSE AEPB Inhale 1 puff 2 (two) times daily into the lungs. 02/02/17  Yes Arnoldo Morale, MD  gabapentin (NEURONTIN) 300 MG capsule Take 1 capsule (300 mg total) 2 (two) times daily by mouth. 02/02/17  Yes Arnoldo Morale, MD  pantoprazole (PROTONIX) 40 MG tablet Take 40 mg by mouth daily.  01/30/17  Yes [provider]  VENTOLIN HFA 108 (90 Base) MCG/ACT inhaler Inhale 2 puffs every 6 (six) hours as needed into the lungs for wheezing or shortness of breath. 02/02/17  Yes Arnoldo Morale, MD  tiZANidine (ZANAFLEX) 4 MG tablet Take 1 tablet (4 mg total) every 8 (eight) hours as needed by mouth for muscle spasms. Patient not taking: Reported on 04/05/2017 02/02/17   Arnoldo Morale, MD    Family History Family History  Problem Relation  Age of Onset  . Asthma Mother   . Allergies Mother   . Allergies Sister   . Allergies Brother   . Deep vein thrombosis Brother        two brothers with recurrent DVT  . Heart attack Father        a.60s b. deceased in his 34s  . Coronary artery disease Father     Social History Social History   Tobacco Use  . Smoking status: Current Every Day Smoker    Packs/day: 0.25    Years: 39.00    Pack years: 9.75    Types: Cigarettes, Cigars  . Smokeless tobacco: Never Used  Substance Use Topics  . Alcohol use: Yes    Alcohol/week: 6.6 oz    Types: 7 Cans of beer, 4 Shots of liquor per week    Comment: 12/20/2016 "couple 40's on the weekend;  shot qod"  . Drug use: Yes    Types: Cocaine    Comment: 12/20/2016 "might have used some the other day; I'm not sure"     Allergies   No known allergies   Review of Systems Review of Systems  Constitutional: Positive for chills and fever (subjective).  HENT: Negative for rhinorrhea and sore throat.   Eyes: Negative for visual disturbance.  Respiratory: Positive for cough, shortness of breath and wheezing.   Cardiovascular: Negative for chest pain.  Gastrointestinal: Negative for abdominal pain, nausea and vomiting.  Genitourinary: Negative for dysuria.  Musculoskeletal: Negative for back pain and neck pain.  Skin: Negative for rash.  Allergic/Immunologic: Negative for immunocompromised state.  Neurological: Negative for syncope, light-headedness and headaches.  Psychiatric/Behavioral: Negative for confusion.  All other systems reviewed and are negative.    Physical Exam Updated Vital Signs BP (!) 147/100   Pulse 74   Temp 98.2 F (36.8 C) (Oral)   Resp (!) 24   SpO2 100%   Physical Exam  Constitutional: He is oriented to person, place, and time. He appears well-developed and well-nourished.  HENT:  Head: Normocephalic and atraumatic.  Eyes: Conjunctivae are normal.  Neck: Neck supple.  Cardiovascular: Regular rhythm.  No  murmur heard. Tachycardic  Pulmonary/Chest: No respiratory distress. He has wheezes. He exhibits no tenderness.  Tachypnea, diffuse wheezing, dry cough on exam  Abdominal: Soft. There is no tenderness.  Musculoskeletal: Normal range of motion. He exhibits no edema.  Neurological: He is alert and oriented to person, place, and time.  Skin: Skin is warm and dry.  Psychiatric: He has a normal mood and affect.  Nursing note and vitals reviewed.    ED Treatments / Results  Labs (all labs ordered are listed, but only abnormal results are displayed) Labs Reviewed  CBC WITH DIFFERENTIAL/PLATELET - Abnormal; Notable for the following components:      Result Value   RBC 3.63 (*)    Hemoglobin 11.0 (*)    HCT 34.0 (*)    All other components within normal limits  I-STAT CHEM 8, ED - Abnormal; Notable for the following components:   Potassium 3.2 (*)    Chloride 114 (*)    Creatinine, Ser 0.60 (*)    Glucose, Bld 105 (*)    Calcium, Ion 0.95 (*)    TCO2 17 (*)    Hemoglobin 9.9 (*)    HCT 29.0 (*)    All other components within normal limits    EKG  EKG Interpretation None       Radiology Dg Chest Portable 1 View  Result Date: 04/05/2017 CLINICAL DATA:  Cold like symptoms and possible asthma exacerbation. Wheezing. EXAM: PORTABLE CHEST 1 VIEW COMPARISON:  12/20/2016 FINDINGS: Lungs are clear. Cardiomediastinal silhouette and remainder the exam is unchanged. IMPRESSION: No active disease. Electronically Signed   By: Marin Olp M.D.   On: 04/05/2017 17:54    Procedures Procedures (including critical care time)  Medications Ordered in ED Medications  doxycycline (VIBRAMYCIN) 100 mg in dextrose 5 % 250 mL IVPB (100 mg Intravenous New Bag/Given 04/05/17 2132)  magnesium sulfate IVPB 2 g 50 mL (0 g Intravenous Stopped 04/05/17 1940)  predniSONE (DELTASONE) tablet 60 mg (60 mg Oral Given 04/05/17 1652)  ipratropium-albuterol (DUONEB) 0.5-2.5 (3) MG/3ML nebulizer solution 3 mL (3  mLs Nebulization Given 04/05/17 1652)  albuterol (PROVENTIL,VENTOLIN) solution continuous neb (10 mg/hr Nebulization Given 04/05/17 1827)  sodium chloride 0.9 % bolus 1,000 mL (0 mLs Intravenous Stopped 04/05/17  2113)  HYDROcodone-acetaminophen (NORCO/VICODIN) 5-325 MG per tablet 1 tablet (1 tablet Oral Given 04/05/17 1950)  methylPREDNISolone sodium succinate (SOLU-MEDROL) 125 mg/2 mL injection 125 mg (125 mg Intravenous Given 04/05/17 2010)  benzonatate (TESSALON) capsule 200 mg (200 mg Oral Given 04/05/17 2119)  albuterol (PROVENTIL,VENTOLIN) solution continuous neb (10 mg/hr Nebulization Given 04/05/17 2125)     Initial Impression / Assessment and Plan / ED Course  I have reviewed the triage vital signs and the nursing notes.  Pertinent labs & imaging results that were available during my care of the patient were reviewed by me and considered in my medical decision making (see chart for details).     Pt with h/o asthma presents with an exacerbation. Says he's been coming down with a "cold" for the last 1.5wks; normally when he gets one his asthma worsens, and that has been happening as expected. He's been using his inhalers more often than normal, and says they've been providing temporary relief, but he's been using it so often that the one he picked up on Monday is now out. Endorses subjective F/C & cough; denies HA, lightheadedness, syncope, CP, N/V/D, urinary symptoms. Duoneb, oral steroids, magnesium ordered in triage.  VS & exam as above. CXR WNL. Continuous albuterol treatment, solumedrol & NS bolus ordered. Labs unremarkable.  On re-evaluation after treatment, Pt's wheezing essentially unchanged so another albuterol treatment ordered. Doxycycline & Teass[place]on ordered based on new cough.  On re-evaluation after 2nd treatment, Pt still wheezing & coughing.  Will admit the Pt to the hospitalist for further evaluation and treatment.  Final Clinical Impressions(s) / ED Diagnoses   Final  diagnoses:  Exacerbation of persistent asthma, unspecified asthma severity    ED Discharge Orders    None       Jenny Reichmann, MD 04/05/17 [place], [place], MD 04/06/17 [phone]

## 2017-04-05 NOTE — ED Triage Notes (Signed)
PT reports hx of asthma. States he has been coming down with a cold and is having asthma exacerbation. Wheezing in all lung fields. Difficulty speaking in complete sentences at times. PT reports he has used inhaler ~ 25-30 pumps today. He got inhaler filled on Monday and is now out.

## 2017-04-05 NOTE — H&P (Signed)
History and Physical    [name] LNL:[id number] DOB: June 22, 1960 DOA: 04/05/2017  PCP: Arnoldo Morale, MD  Patient coming from: Home.  Chief Complaint: Shortness of breath.  HPI: [name] is a 57 y.o. male with history of COPD with ongoing tobacco abuse, CAD status post stenting, chronic diastolic CHF, history of PE/DVT status post IVC filter placement on apixaban presents to the ER because of worsening shortness of breath last few days.  Patient states last week he had upper respiratory tract infection-like symptoms which improved but wheezing worsened.  Denies any chest pain.  Has been having productive cough.  Denies any fever or chills.  ED Course: In the ER patient is found to be wheezing diffusely.  Chest x-ray was unremarkable.  Patient was started on nebulizer treatment IV steroids and antibiotics and admitted for acute COPD exacerbation.  Review of Systems: As per HPI, rest all negative.   Past Medical History:  Diagnosis Date  . Asthma   . CAD (coronary artery disease)    a. 09/2013 NSTEMI/PCI: LM nl, LAD 40-50%, D1 100 (2.25 x 28 Vision BMS), LCX min irregs, RI 60-70, 30, RCA 40-50/50-56ms/p, EF 45-50%.  b. cath 12/2014 -occulded BMS in diag, 50% pro to mid LAD, 60% ramus, 30% RCA   . Chest pain 08/2015  . Chronic diastolic CHF (congestive heart failure) ([place])    a. 09/2014 EF 45-50% by LV gram;  b. 01/2014 Echo: EF 55-60%, Gr 1 DD.  Marland Kitchen Cocaine abuse ([place])   . COPD (chronic obstructive pulmonary disease) ([place])   . DVT (deep venous thrombosis) ([place])    "I had ~ 10 in each leg" (12/20/2016)  . Essential hypertension   . Hepatitis C    "clear free over a year now"  . High cholesterol   . Myocardial infarction ([place]) ~ 2014  . Osteoarthritis    a. hands and toes.  . Pneumonia    "several times" (12/20/2016)  . Pulmonary embolism ([place])    a. 2012 - s/p IVC filter;  b. prev on eliquis - noncompliant.  . Shortness of breath dyspnea   . Sinus headache   . Squamous  cell skin cancer 07/13/2016   "foot"  . Tobacco abuse     Past Surgical History:  Procedure Laterality Date  . CARDIAC CATHETERIZATION N/A 01/04/2015   Procedure: Left Heart Cath and Coronary Angiography;  Surgeon: Burnell Blanks, MD;  Location: [facility] CV LAB;  Service: Cardiovascular;  Laterality: N/A;  . CORONARY ANGIOPLASTY WITH STENT PLACEMENT  10/21/13   BMS to D1  . CYST EXCISION N/A 05/10/2016   Procedure: EXCISION OF SEBACEOUS CYST UPPER BACK;  Surgeon: Donnie Mesa, MD;  Location: [facility];  Service: General;  Laterality: N/A;  . FOOT SURGERY Right ~ 06/2016   "said there was skin cancer on it"  . LEFT HEART CATHETERIZATION WITH CORONARY ANGIOGRAM N/A 10/21/2013   Procedure: LEFT HEART CATHETERIZATION WITH CORONARY ANGIOGRAM;  Surgeon: Leonie Man, MD;  Location[facility] CATH LAB;  Service: Cardiovascular;  Laterality: N/A;  . VENA CAVA FILTER PLACEMENT  ~ 2007-~ 2017   "1 in my neck; 2 in my wrist"     reports that he has been smoking cigarettes and cigars.  He has a 9.75 pack-year smoking history. he has never used smokeless tobacco. He reports that he drinks about 6.6 oz of alcohol per week. He reports that he uses drugs. Drug: Cocaine.  Allergies  Allergen Reactions  . No Known Allergies  Family History  Problem Relation Age of Onset  . Asthma Mother   . Allergies Mother   . Allergies Sister   . Allergies Brother   . Deep vein thrombosis Brother        two brothers with recurrent DVT  . Heart attack Father        a.60s b. deceased in his 70s  . Coronary artery disease Father     Prior to Admission medications   Medication Sig Start Date End Date Taking? Authorizing Provider  acetaminophen (TYLENOL) 325 MG tablet Take 650 mg by mouth every 6 (six) hours as needed for mild pain.   Yes [provider]  albuterol (PROVENTIL) (2.5 MG/3ML) 0.083% nebulizer solution Take 3 mLs (2.5 mg total) every 6 (six) hours as needed by nebulization for wheezing  or shortness of breath. 02/02/17  Yes Arnoldo Morale, MD  apixaban (ELIQUIS) 5 MG TABS tablet Take 1 tablet (5 mg total) 2 (two) times daily by mouth. 02/02/17  Yes Arnoldo Morale, MD  atorvastatin (LIPITOR) 40 MG tablet Take 1 tablet (40 mg total) daily at 6 PM by mouth. 02/02/17  Yes Arnoldo Morale, MD  diclofenac sodium (VOLTAREN) 1 % GEL Apply 4 g 4 (four) times daily topically. 02/02/17  Yes Arnoldo Morale, MD  fluticasone (FLONASE) 50 MCG/ACT nasal spray Place 2 sprays into both nostrils 2 (two) times daily. 03/30/17  Yes [provider]  Fluticasone-Salmeterol (ADVAIR DISKUS) 250-50 MCG/DOSE AEPB Inhale 1 puff 2 (two) times daily into the lungs. 02/02/17  Yes Arnoldo Morale, MD  gabapentin (NEURONTIN) 300 MG capsule Take 1 capsule (300 mg total) 2 (two) times daily by mouth. 02/02/17  Yes Arnoldo Morale, MD  pantoprazole (PROTONIX) 40 MG tablet Take 40 mg by mouth daily.  01/30/17  Yes [provider]  VENTOLIN HFA 108 (90 Base) MCG/ACT inhaler Inhale 2 puffs every 6 (six) hours as needed into the lungs for wheezing or shortness of breath. 02/02/17  Yes Arnoldo Morale, MD  tiZANidine (ZANAFLEX) 4 MG tablet Take 1 tablet (4 mg total) every 8 (eight) hours as needed by mouth for muscle spasms. Patient not taking: Reported on 04/05/2017 02/02/17   Arnoldo Morale, MD    Physical Exam: Vitals:   04/05/17 2100 04/05/17 2115 04/05/17 2125 04/05/17 2130  BP: 137/87 (!) 145/85  (!) 147/100  Pulse: 84 76  74  Resp: 18 (!) 28  (!) 24  Temp:      TempSrc:      SpO2: 95% 97% 97% 100%      Constitutional: Moderately built and nourished. Vitals:   04/05/17 2100 04/05/17 2115 04/05/17 2125 04/05/17 2130  BP: 137/87 (!) 145/85  (!) 147/100  Pulse: 84 76  74  Resp: 18 (!) 28  (!) 24  Temp:      TempSrc:      SpO2: 95% 97% 97% 100%   Eyes: Anicteric no pallor. ENMT: No discharge from the ears eyes nose or mouth. Neck: No JVD appreciated no mass felt. Respiratory: Bilateral expiratory wheeze  heard no crepitations. Cardiovascular: S1-S2 heard no murmurs appreciated. Abdomen: Soft nontender bowel sounds present. Musculoskeletal: No edema.  No joint effusion. Skin: No rash.  Skin appears warm. Neurologic: Alert awake oriented to time place and person.  Moves all extremities. Psychiatric: Appears normal.  Normal affect.   Labs on Admission: I have personally reviewed following labs and imaging studies  CBC: Recent Labs  Lab 04/05/17 2245 04/05/17 2251  WBC 4.3  --  NEUTROABS 3.2  --   HGB 11.0* 9.9*  HCT 34.0* 29.0*  MCV 93.7  --   PLT 154  --    Basic Metabolic Panel: Recent Labs  Lab 04/05/17 2251  NA 144  K 3.2*  CL 114*  GLUCOSE 105*  BUN 6  CREATININE 0.60*   GFR: CrCl cannot be calculated (Unknown ideal weight.). Liver Function Tests: No results for input(s): AST, ALT, ALKPHOS, BILITOT, PROT, ALBUMIN in the last 168 hours. No results for input(s): LIPASE, AMYLASE in the last 168 hours. No results for input(s): AMMONIA in the last 168 hours. Coagulation Profile: No results for input(s): INR, PROTIME in the last 168 hours. Cardiac Enzymes: No results for input(s): CKTOTAL, CKMB, CKMBINDEX, TROPONINI in the last 168 hours. BNP (last 3 results) No results for input(s): PROBNP in the last 8760 hours. HbA1C: No results for input(s): HGBA1C in the last 72 hours. CBG: No results for input(s): GLUCAP in the last 168 hours. Lipid Profile: No results for input(s): CHOL, HDL, LDLCALC, TRIG, CHOLHDL, LDLDIRECT in the last 72 hours. Thyroid Function Tests: No results for input(s): TSH, T4TOTAL, FREET4, T3FREE, THYROIDAB in the last 72 hours. Anemia Panel: No results for input(s): VITAMINB12, FOLATE, FERRITIN, TIBC, IRON, RETICCTPCT in the last 72 hours. Urine analysis:    Component Value Date/Time   COLORURINE YELLOW 09/08/2015 0411   APPEARANCEUR HAZY (A) 09/08/2015 0411   LABSPEC 1.011 09/08/2015 0411   PHURINE 5.5 09/08/2015 0411   GLUCOSEU NEGATIVE  09/08/2015 0411   HGBUR NEGATIVE 09/08/2015 0411   BILIRUBINUR NEGATIVE 09/08/2015 0411   KETONESUR NEGATIVE 09/08/2015 0411   PROTEINUR NEGATIVE 09/08/2015 0411   UROBILINOGEN 0.2 03/28/2012 0118   NITRITE NEGATIVE 09/08/2015 0411   LEUKOCYTESUR NEGATIVE 09/08/2015 0411   Sepsis Labs: @LABRCNTIP (procalcitonin:4,lacticidven:4) )No results found for this or any previous visit (from the past 240 hour(s)).   Radiological Exams on Admission: Dg Chest Portable 1 View  Result Date: 04/05/2017 CLINICAL DATA:  Cold like symptoms and possible asthma exacerbation. Wheezing. EXAM: PORTABLE CHEST 1 VIEW COMPARISON:  12/20/2016 FINDINGS: Lungs are clear. Cardiomediastinal silhouette and remainder the exam is unchanged. IMPRESSION: No active disease. Electronically Signed   By: Marin Olp M.D.   On: 04/05/2017 17:54    EKG: Independently reviewed.  Sinus tachycardia.  Assessment/Plan Principal Problem:   COPD exacerbation (HCC) Active Problems:   History of pulmonary embolism   Anemia   HTN (hypertension)   Chronic hepatitis C without hepatic coma (HCC)   Chronic diastolic heart failure ([place])    1. Acute respiratory failure secondary to COPD exacerbation -patient has been placed on albuterol Atrovent and Pulmicort nebulizers.  I have added doxycycline since patient has productive sputum.  Check respiratory viral panel. 2. History of CAD status post stenting -denies any chest pain.  Patient is on apixaban and statins. 3. Normocytic normochromic anemia with mild worsening -follow CBC.  Check anemia panel. 4. History of chronic diastolic CHF last EF measured in February 2018 was 55-60% -appears compensated.  Patient not on any diuretics. 5. Hypertension -I do not see patient is on any antihypertensives.  We will closely follow blood pressure trends. 6. History of polysubstance abuse -urine drug screen pending. 7. History of recurrent DVT/PE status post IVC filter placement on  apixaban.   DVT prophylaxis: Apixaban.  Code Status: Full code. Family Communication: Discussed with patient. Disposition Plan: Home. Consults called: None. Admission status: Observation.   Rise Patience MD [facility] Pager [phone].  If 7PM-7AM,  please contact night-coverage [email or web address] Password [email or web address]  04/05/2017, 11:35 PM

## 2017-04-06 ENCOUNTER — Other Ambulatory Visit: Payer: Self-pay

## 2017-04-06 DIAGNOSIS — M19042 Primary osteoarthritis, left hand: Secondary | ICD-10-CM | POA: Diagnosis present

## 2017-04-06 DIAGNOSIS — I2510 Atherosclerotic heart disease of native coronary artery without angina pectoris: Secondary | ICD-10-CM | POA: Diagnosis present

## 2017-04-06 DIAGNOSIS — Z86711 Personal history of pulmonary embolism: Secondary | ICD-10-CM | POA: Diagnosis not present

## 2017-04-06 DIAGNOSIS — E785 Hyperlipidemia, unspecified: Secondary | ICD-10-CM | POA: Diagnosis present

## 2017-04-06 DIAGNOSIS — Z7951 Long term (current) use of inhaled steroids: Secondary | ICD-10-CM | POA: Diagnosis not present

## 2017-04-06 DIAGNOSIS — F17210 Nicotine dependence, cigarettes, uncomplicated: Secondary | ICD-10-CM | POA: Diagnosis present

## 2017-04-06 DIAGNOSIS — I110 Hypertensive heart disease with heart failure: Secondary | ICD-10-CM | POA: Diagnosis present

## 2017-04-06 DIAGNOSIS — J441 Chronic obstructive pulmonary disease with (acute) exacerbation: Secondary | ICD-10-CM | POA: Diagnosis not present

## 2017-04-06 DIAGNOSIS — I10 Essential (primary) hypertension: Secondary | ICD-10-CM

## 2017-04-06 DIAGNOSIS — D649 Anemia, unspecified: Secondary | ICD-10-CM | POA: Diagnosis present

## 2017-04-06 DIAGNOSIS — B182 Chronic viral hepatitis C: Secondary | ICD-10-CM | POA: Diagnosis not present

## 2017-04-06 DIAGNOSIS — M19079 Primary osteoarthritis, unspecified ankle and foot: Secondary | ICD-10-CM | POA: Diagnosis present

## 2017-04-06 DIAGNOSIS — M19041 Primary osteoarthritis, right hand: Secondary | ICD-10-CM | POA: Diagnosis present

## 2017-04-06 DIAGNOSIS — I252 Old myocardial infarction: Secondary | ICD-10-CM | POA: Diagnosis not present

## 2017-04-06 DIAGNOSIS — Z955 Presence of coronary angioplasty implant and graft: Secondary | ICD-10-CM | POA: Diagnosis not present

## 2017-04-06 DIAGNOSIS — I5032 Chronic diastolic (congestive) heart failure: Secondary | ICD-10-CM | POA: Diagnosis not present

## 2017-04-06 DIAGNOSIS — Z825 Family history of asthma and other chronic lower respiratory diseases: Secondary | ICD-10-CM | POA: Diagnosis not present

## 2017-04-06 DIAGNOSIS — D500 Iron deficiency anemia secondary to blood loss (chronic): Secondary | ICD-10-CM | POA: Diagnosis not present

## 2017-04-06 DIAGNOSIS — Z8249 Family history of ischemic heart disease and other diseases of the circulatory system: Secondary | ICD-10-CM | POA: Diagnosis not present

## 2017-04-06 DIAGNOSIS — Z86718 Personal history of other venous thrombosis and embolism: Secondary | ICD-10-CM | POA: Diagnosis not present

## 2017-04-06 DIAGNOSIS — F17290 Nicotine dependence, other tobacco product, uncomplicated: Secondary | ICD-10-CM | POA: Diagnosis present

## 2017-04-06 DIAGNOSIS — E7800 Pure hypercholesterolemia, unspecified: Secondary | ICD-10-CM | POA: Diagnosis present

## 2017-04-06 DIAGNOSIS — Z95828 Presence of other vascular implants and grafts: Secondary | ICD-10-CM | POA: Diagnosis not present

## 2017-04-06 DIAGNOSIS — Z7901 Long term (current) use of anticoagulants: Secondary | ICD-10-CM | POA: Diagnosis not present

## 2017-04-06 DIAGNOSIS — R0602 Shortness of breath: Secondary | ICD-10-CM | POA: Diagnosis not present

## 2017-04-06 LAB — BASIC METABOLIC PANEL
ANION GAP: 13 (ref 5–15)
BUN: 9 mg/dL (ref 6–20)
CALCIUM: 8.8 mg/dL — AB (ref 8.9–10.3)
CO2: 17 mmol/L — ABNORMAL LOW (ref 22–32)
Chloride: 104 mmol/L (ref 101–111)
Creatinine, Ser: 0.83 mg/dL (ref 0.61–1.24)
GFR calc Af Amer: >60 mL/min (ref >60)
Glucose, Bld: 191 mg/dL — ABNORMAL HIGH (ref 65–99)
Potassium: 4.4 mmol/L (ref 3.5–5.1)
SODIUM: 134 mmol/L — AB (ref 135–145)

## 2017-04-06 LAB — RESPIRATORY PANEL BY PCR
Adenovirus: NOT DETECTED
BORDETELLA PERTUSSIS-RVPCR: NOT DETECTED
CORONAVIRUS HKU1-RVPPCR: NOT DETECTED
CORONAVIRUS NL63-RVPPCR: NOT DETECTED
CORONAVIRUS OC43-RVPPCR: NOT DETECTED
Chlamydophila pneumoniae: NOT DETECTED
Coronavirus 229E: NOT DETECTED
Influenza A: NOT DETECTED
Influenza B: NOT DETECTED
METAPNEUMOVIRUS-RVPPCR: NOT DETECTED
Mycoplasma pneumoniae: NOT DETECTED
Parainfluenza Virus 1: NOT DETECTED
Parainfluenza Virus 2: NOT DETECTED
Parainfluenza Virus 3: NOT DETECTED
Parainfluenza Virus 4: NOT DETECTED
RESPIRATORY SYNCYTIAL VIRUS-RVPPCR: NOT DETECTED
RHINOVIRUS / ENTEROVIRUS - RVPPCR: NOT DETECTED

## 2017-04-06 LAB — CBC WITH DIFFERENTIAL/PLATELET
BASOS ABS: 0 10*3/uL (ref 0.0–0.1)
BASOS PCT: 0 %
EOS PCT: 0 %
Eosinophils Absolute: 0 10*3/uL (ref 0.0–0.7)
HEMATOCRIT: 32.6 % — AB (ref 39.0–52.0)
Hemoglobin: 10.4 g/dL — ABNORMAL LOW (ref 13.0–17.0)
Lymphocytes Relative: 16 %
Lymphs Abs: 0.5 10*3/uL — ABNORMAL LOW (ref 0.7–4.0)
MCH: 29.6 pg (ref 26.0–34.0)
MCHC: 31.9 g/dL (ref 30.0–36.0)
MCV: 92.9 fL (ref 78.0–100.0)
Monocytes Absolute: 0.1 10*3/uL (ref 0.1–1.0)
Monocytes Relative: 2 %
NEUTROS ABS: 2.6 10*3/uL (ref 1.7–7.7)
Neutrophils Relative %: 82 %
PLATELETS: 149 10*3/uL — AB (ref 150–400)
RBC: 3.51 MIL/uL — AB (ref 4.22–5.81)
RDW: 13.9 % (ref 11.5–15.5)
WBC: 3.2 10*3/uL — AB (ref 4.0–10.5)

## 2017-04-06 LAB — MAGNESIUM: Magnesium: 2.2 mg/dL (ref 1.7–2.4)

## 2017-04-06 LAB — TROPONIN I

## 2017-04-06 LAB — RAPID URINE DRUG SCREEN, HOSP PERFORMED
Amphetamines: NOT DETECTED
BARBITURATES: NOT DETECTED
Benzodiazepines: NOT DETECTED
Cocaine: NOT DETECTED
Opiates: POSITIVE — AB
TETRAHYDROCANNABINOL: NOT DETECTED

## 2017-04-06 LAB — IRON AND TIBC
Iron: 62 ug/dL (ref 45–182)
SATURATION RATIOS: 18 % (ref 17.9–39.5)
TIBC: 353 ug/dL (ref 250–450)
UIBC: 291 ug/dL

## 2017-04-06 LAB — FOLATE: Folate: 8.6 ng/mL (ref >5.9)

## 2017-04-06 LAB — RETICULOCYTES
RBC.: 3.52 MIL/uL — AB (ref 4.22–5.81)
RETIC CT PCT: 1.5 % (ref 0.4–3.1)
Retic Count, Absolute: 52.8 10*3/uL (ref 19.0–186.0)

## 2017-04-06 LAB — VITAMIN B12: Vitamin B-12: 416 pg/mL (ref 180–914)

## 2017-04-06 LAB — FERRITIN: Ferritin: 44 ng/mL (ref 24–336)

## 2017-04-06 MED ORDER — ALBUTEROL SULFATE (2.5 MG/3ML) 0.083% IN NEBU: 3.0000 mL | INHALATION_SOLUTION | RESPIRATORY_TRACT | Status: DC | PRN

## 2017-04-06 MED ORDER — IPRATROPIUM-ALBUTEROL 0.5-2.5 (3) MG/3ML IN SOLN
3.0000 mL | Freq: Four times a day (QID) | RESPIRATORY_TRACT | Status: DC
  Administered 2017-04-06 (×2): 3 mL via RESPIRATORY_TRACT
  Filled 2017-04-06 (×2): qty 3

## 2017-04-06 MED ORDER — METHYLPREDNISOLONE SODIUM SUCC 125 MG IJ SOLR
60.0000 mg | Freq: Four times a day (QID) | INTRAMUSCULAR | Status: DC
  Administered 2017-04-06 – 2017-04-08 (×8): 60 mg via INTRAVENOUS
  Filled 2017-04-06 (×9): qty 2

## 2017-04-06 MED ORDER — CYCLOBENZAPRINE HCL 5 MG PO TABS
7.5000 mg | ORAL_TABLET | Freq: Three times a day (TID) | ORAL | Status: DC | PRN
  Filled 2017-04-06: qty 1.5

## 2017-04-06 MED ORDER — DM-GUAIFENESIN ER 30-600 MG PO TB12
1.0000 | ORAL_TABLET | Freq: Two times a day (BID) | ORAL | Status: DC | PRN
  Administered 2017-04-06 – 2017-04-10 (×7): 1 via ORAL
  Filled 2017-04-06 (×7): qty 1

## 2017-04-06 MED ORDER — POTASSIUM CHLORIDE IN NACL 40-0.9 MEQ/L-% IV SOLN
INTRAVENOUS | Status: DC
Start: 2017-04-06 — End: 2017-04-07
  Administered 2017-04-06: 75 mL/h via INTRAVENOUS
  Filled 2017-04-06 (×3): qty 1000

## 2017-04-06 MED ORDER — OXYCODONE HCL 5 MG PO TABS
5.0000 mg | ORAL_TABLET | Freq: Four times a day (QID) | ORAL | Status: DC | PRN
  Administered 2017-04-06 – 2017-04-11 (×14): 5 mg via ORAL
  Filled 2017-04-06 (×15): qty 1

## 2017-04-06 MED ORDER — IPRATROPIUM-ALBUTEROL 0.5-2.5 (3) MG/3ML IN SOLN
3.0000 mL | RESPIRATORY_TRACT | Status: DC
Start: 2017-04-06 — End: 2017-04-10
  Administered 2017-04-06 – 2017-04-10 (×22): 3 mL via RESPIRATORY_TRACT
  Filled 2017-04-06 (×21): qty 3

## 2017-04-06 MED ORDER — POTASSIUM CHLORIDE CRYS ER 20 MEQ PO TBCR
20.0000 meq | EXTENDED_RELEASE_TABLET | Freq: Once | ORAL | Status: AC
  Administered 2017-04-06: 20 meq via ORAL
  Filled 2017-04-06: qty 1

## 2017-04-06 NOTE — Progress Notes (Signed)
PROGRESS NOTE    [name]  ZOX:[id number] DOB: 1960-05-21 DOA: 04/05/2017 PCP: Arnoldo Morale, MD   Brief Narrative: 57 year old male with history of COPD, active tobacco use, coronary artery disease status post stent, chronic diastolic congestive heart failure, history of PE/DVT status post IVC filter on apixaban presented to ER with worsening shortness of breath, wheezing and coughing.  Patient also with clear productive cough and feels congested.  Chest x-ray unremarkable.  Admitted for COPD exacerbation.  Assessment & Plan:   #Acute COPD exacerbation: Patient with shortness of breath bilateral wheeze with coughing and not feeling well today.  I increase the dose of Solu-Medrol IV to every 6-hour, start albuterol inhaler and nebulizer.  Patient preferred inhaler than nebulizer.  Continue doxycycline.  Continue supportive care. -Respiratory viral study negative. -Started muscle relaxation and pain medication because of chest and back pain while coughing.  #Acute respiratory failure with hypoxia: Improving.  Oxygen as needed.  #Hypertension, essential  #History of recurrent DVT PE status post IVC filter currently on Eliquis.  #History of chronic diastolic congestive heart failure: Echo in February 2018 with EF of 55-60%.  #History of coronary artery disease status post stent  DVT prophylaxis: Eliquis Code Status: Full code Family Communication: No family at bedside Disposition Plan: Currently admitted    Consultants:   None  Procedures: None Antimicrobials: Doxycycline  Subjective: Seen and examined at bedside.  Not feeling well.  Having shortness of breath, coughing and diffuse wheezing.  Not at baseline.  Patient reported chest and back pain while coughing  Objective: Vitals:   04/06/17 0800 04/06/17 1000 04/06/17 1200 04/06/17 1300  BP: (!) 145/93 (!) 146/92 (!) 149/94   Pulse: 77 86 86 (!) 104  Resp: 10 19 15 17   Temp:      TempSrc:      SpO2: 100% 97% 96%  100%    Intake/Output Summary (Last 24 hours) at 04/06/2017 1335 Last data filed at 04/06/2017 0953 Gross per 24 hour  Intake 1550 ml  Output -  Net 1550 ml   There were no vitals filed for this visit.  Examination:  General exam: Appears calm and comfortable  Respiratory system: Bilateral diffuse expiratory wheeze and rhonchi, respiratory effort normal Cardiovascular system: S1 & S2 heard, RRR.  No pedal edema. Gastrointestinal system: Abdomen is nondistended, soft and nontender. Normal bowel sounds heard. Central nervous system: Alert and oriented. No focal neurological deficits. Extremities: Symmetric 5 x 5 power. Skin: No rashes, lesions or ulcers Psychiatry: Judgement and insight appear normal. Mood & affect appropriate.     Data Reviewed: I have personally reviewed following labs and imaging studies  CBC: Recent Labs  Lab 04/05/17 2245 04/05/17 2251 04/06/17 0626  WBC 4.3  --  3.2*  NEUTROABS 3.2  --  2.6  HGB 11.0* 9.9* 10.4*  HCT 34.0* 29.0* 32.6*  MCV 93.7  --  92.9  PLT 154  --  811*   Basic Metabolic Panel: Recent Labs  Lab 04/05/17 2251 04/06/17 0626  NA 144 134*  K 3.2* 4.4  CL 114* 104  CO2  --  17*  GLUCOSE 105* 191*  BUN 6 9  CREATININE 0.60* 0.83  CALCIUM  --  8.8*  MG  --  2.2   GFR: CrCl cannot be calculated (Unknown ideal weight.). Liver Function Tests: No results for input(s): AST, ALT, ALKPHOS, BILITOT, PROT, ALBUMIN in the last 168 hours. No results for input(s): LIPASE, AMYLASE in the last 168 hours. No results for input(s):  AMMONIA in the last 168 hours. Coagulation Profile: No results for input(s): INR, PROTIME in the last 168 hours. Cardiac Enzymes: Recent Labs  Lab 04/06/17 0626  TROPONINI <0.03   BNP (last 3 results) No results for input(s): PROBNP in the last 8760 hours. HbA1C: No results for input(s): HGBA1C in the last 72 hours. CBG: No results for input(s): GLUCAP in the last 168 hours. Lipid Profile: No  results for input(s): CHOL, HDL, LDLCALC, TRIG, CHOLHDL, LDLDIRECT in the last 72 hours. Thyroid Function Tests: No results for input(s): TSH, T4TOTAL, FREET4, T3FREE, THYROIDAB in the last 72 hours. Anemia Panel: Recent Labs    04/06/17 0626  VITAMINB12 416  FOLATE 8.6  FERRITIN 44  TIBC 353  IRON 62  RETICCTPCT 1.5   Sepsis Labs: No results for input(s): PROCALCITON, LATICACIDVEN in the last 168 hours.  Recent Results (from the past 240 hour(s))  Respiratory Panel by PCR     Status: None   Collection Time: 04/06/17  5:32 AM  Result Value Ref Range Status   Adenovirus NOT DETECTED NOT DETECTED Final   Coronavirus 229E NOT DETECTED NOT DETECTED Final   Coronavirus HKU1 NOT DETECTED NOT DETECTED Final   Coronavirus NL63 NOT DETECTED NOT DETECTED Final   Coronavirus OC43 NOT DETECTED NOT DETECTED Final   Metapneumovirus NOT DETECTED NOT DETECTED Final   Rhinovirus / Enterovirus NOT DETECTED NOT DETECTED Final   Influenza A NOT DETECTED NOT DETECTED Final   Influenza B NOT DETECTED NOT DETECTED Final   Parainfluenza Virus 1 NOT DETECTED NOT DETECTED Final   Parainfluenza Virus 2 NOT DETECTED NOT DETECTED Final   Parainfluenza Virus 3 NOT DETECTED NOT DETECTED Final   Parainfluenza Virus 4 NOT DETECTED NOT DETECTED Final   Respiratory Syncytial Virus NOT DETECTED NOT DETECTED Final   Bordetella pertussis NOT DETECTED NOT DETECTED Final   Chlamydophila pneumoniae NOT DETECTED NOT DETECTED Final   Mycoplasma pneumoniae NOT DETECTED NOT DETECTED Final         Radiology Studies: Dg Chest Portable 1 View  Result Date: 04/05/2017 CLINICAL DATA:  Cold like symptoms and possible asthma exacerbation. Wheezing. EXAM: PORTABLE CHEST 1 VIEW COMPARISON:  12/20/2016 FINDINGS: Lungs are clear. Cardiomediastinal silhouette and remainder the exam is unchanged. IMPRESSION: No active disease. Electronically Signed   By: Marin Olp M.D.   On: 04/05/2017 17:54        Scheduled  Meds: . apixaban  5 mg Oral BID  . atorvastatin  40 mg Oral q1800  . budesonide (PULMICORT) nebulizer solution  0.25 mg Nebulization BID  . fluticasone  2 spray Each Nare BID  . gabapentin  300 mg Oral BID  . ipratropium  0.5 mg Nebulization Q4H  . ipratropium-albuterol  3 mL Nebulization Q6H  . methylPREDNISolone (SOLU-MEDROL) injection  60 mg Intravenous Q6H  . pantoprazole  40 mg Oral Daily   Continuous Infusions: . 0.9 % NaCl with KCl 40 mEq / L 75 mL/hr (04/06/17 1045)  . doxycycline (VIBRAMYCIN) IV Stopped (04/06/17 7209)     LOS: 0 days    Dron Tanna Furry, MD [facility] Pager [phone]  If 7PM-7AM, please contact night-coverage [email or web address] Password [email or web address] 04/06/2017, 1:35 PM

## 2017-04-06 NOTE — ED Notes (Signed)
Pt ordered his own lunch tray

## 2017-04-06 NOTE — ED Notes (Signed)
Attempted report 

## 2017-04-07 LAB — BASIC METABOLIC PANEL
Anion gap: 7 (ref 5–15)
BUN: 18 mg/dL (ref 6–20)
CHLORIDE: 107 mmol/L (ref 101–111)
CO2: 20 mmol/L — ABNORMAL LOW (ref 22–32)
CREATININE: 0.99 mg/dL (ref 0.61–1.24)
Calcium: 8.9 mg/dL (ref 8.9–10.3)
Glucose, Bld: 143 mg/dL — ABNORMAL HIGH (ref 65–99)
POTASSIUM: 4.7 mmol/L (ref 3.5–5.1)
SODIUM: 134 mmol/L — AB (ref 135–145)

## 2017-04-07 MED ORDER — ALBUTEROL SULFATE (2.5 MG/3ML) 0.083% IN NEBU: 3.0000 mL | INHALATION_SOLUTION | RESPIRATORY_TRACT | Status: DC | PRN

## 2017-04-07 MED ORDER — DOXYCYCLINE HYCLATE 100 MG PO TABS
100.0000 mg | ORAL_TABLET | Freq: Two times a day (BID) | ORAL | Status: DC
  Administered 2017-04-07 – 2017-04-11 (×8): 100 mg via ORAL
  Filled 2017-04-07 (×8): qty 1

## 2017-04-07 NOTE — Progress Notes (Signed)
Dr. Maylene Roes discontinued cardiac monitoring for patient, but says patient can remain on 3E as med surg patient for care.

## 2017-04-07 NOTE — Progress Notes (Signed)
PROGRESS NOTE    [name]  IWL:[id number] DOB: 02-17-1961 DOA: 04/05/2017 PCP: Arnoldo Morale, MD     Brief Narrative:  [name] is a 57 yo male with past medical history of COPD, active tobacco use, coronary artery disease status post stent, chronic diastolic congestive heart failure, history of PE/DVT status post IVC filter on apixaban presented to ER with worsening shortness of breath, wheezing and coughing.  Patient also with clear productive cough and feels congested.  Chest x-ray unremarkable.  Admitted for COPD exacerbation.   Assessment & Plan:   Principal Problem:   COPD exacerbation ([place]) Active Problems:   History of pulmonary embolism   Anemia   HTN (hypertension)   Chronic hepatitis C without hepatic coma (HCC)   Chronic diastolic heart failure (HCC)   Acute COPD exacerbation -Respiratory viral study negative. -Continue IV solumederol, doxycycline, nebs, pulmicort   Acute respiratory failure with hypoxia -On room air this morning  History of recurrent DVT PE status post IVC filter  -Currently on Eliquis  Chronic diastolic congestive heart failure -Echo in February 2018 with EF of 55-60%  -Stable   HLD -Continue lipitor   Coronary artery disease status post stent -Stable   DVT prophylaxis: Eliquis Code Status: Full Family Communication: No family at bedside Disposition Plan: Pending improvement. Continue IV steroids another day due to diffuse wheezes    Consultants:   None  Procedures:   None   Antimicrobials:  Anti-infectives (From admission, onward)   Start     Dose/Rate Route Frequency Ordered Stop   04/06/17 0800  doxycycline (VIBRAMYCIN) 100 mg in dextrose 5 % 250 mL IVPB     100 mg 125 mL/hr over 120 Minutes Intravenous Every 12 hours 04/05/17 2335     04/05/17 2030  doxycycline (VIBRAMYCIN) 100 mg in dextrose 5 % 250 mL IVPB     100 mg 125 mL/hr over 120 Minutes Intravenous  Once 04/05/17 2027 04/06/17 0101         Subjective: Continues to have coughing fits, shortness of breath at rest.    Objective: Vitals:   04/07/17 0318 04/07/17 0632 04/07/17 0725 04/07/17 1132  BP:  (!) 153/87    Pulse:  79    Resp:  20    Temp:  97.7 F (36.5 C)    TempSrc:  Oral    SpO2: 97% 96% 95% 97%  Weight:  87.7 kg (193 lb 6.4 oz)    Height:        Intake/Output Summary (Last 24 hours) at 04/07/2017 1308 Last data filed at 04/07/2017 1100 Gross per 24 hour  Intake 2663.75 ml  Output 1900 ml  Net 763.75 ml   Filed Weights   04/06/17 1725 04/07/17 1740  Weight: 86.7 kg (191 lb 1.6 oz) 87.7 kg (193 lb 6.4 oz)    Examination:  General exam: Appears calm and comfortable  Respiratory system: Diffuse wheezes bilaterally, dry coughing fits throughout exam Cardiovascular system: S1 & S2 heard, RRR. No JVD, murmurs, rubs, gallops or clicks. No pedal edema. Gastrointestinal system: Abdomen is nondistended, soft and nontender. No organomegaly or masses felt. Normal bowel sounds heard. Central nervous system: Alert and oriented. No focal neurological deficits. Extremities: Symmetric 5 x 5 power. Skin: No rashes, lesions or ulcers Psychiatry: Judgement and insight appear normal. Mood & affect appropriate.   Data Reviewed: I have personally reviewed following labs and imaging studies  CBC: Recent Labs  Lab 04/05/17 2245 04/05/17 2251 04/06/17 0626  WBC 4.3  --  3.2*  NEUTROABS 3.2  --  2.6  HGB 11.0* 9.9* 10.4*  HCT 34.0* 29.0* 32.6*  MCV 93.7  --  92.9  PLT 154  --  998*   Basic Metabolic Panel: Recent Labs  Lab 04/05/17 2251 04/06/17 0626 04/07/17 0323  NA 144 134* 134*  K 3.2* 4.4 4.7  CL 114* 104 107  CO2  --  17* 20*  GLUCOSE 105* 191* 143*  BUN 6 9 18   CREATININE 0.60* 0.83 0.99  CALCIUM  --  8.8* 8.9  MG  --  2.2  --    GFR: Estimated Creatinine Clearance: 96.9 mL/min (by C-G formula based on SCr of 0.99 mg/dL). Liver Function Tests: No results for input(s): AST, ALT,  ALKPHOS, BILITOT, PROT, ALBUMIN in the last 168 hours. No results for input(s): LIPASE, AMYLASE in the last 168 hours. No results for input(s): AMMONIA in the last 168 hours. Coagulation Profile: No results for input(s): INR, PROTIME in the last 168 hours. Cardiac Enzymes: Recent Labs  Lab 04/06/17 0626  TROPONINI <0.03   BNP (last 3 results) No results for input(s): PROBNP in the last 8760 hours. HbA1C: No results for input(s): HGBA1C in the last 72 hours. CBG: No results for input(s): GLUCAP in the last 168 hours. Lipid Profile: No results for input(s): CHOL, HDL, LDLCALC, TRIG, CHOLHDL, LDLDIRECT in the last 72 hours. Thyroid Function Tests: No results for input(s): TSH, T4TOTAL, FREET4, T3FREE, THYROIDAB in the last 72 hours. Anemia Panel: Recent Labs    04/06/17 0626  VITAMINB12 416  FOLATE 8.6  FERRITIN 44  TIBC 353  IRON 62  RETICCTPCT 1.5   Sepsis Labs: No results for input(s): PROCALCITON, LATICACIDVEN in the last 168 hours.  Recent Results (from the past 240 hour(s))  Respiratory Panel by PCR     Status: None   Collection Time: 04/06/17  5:32 AM  Result Value Ref Range Status   Adenovirus NOT DETECTED NOT DETECTED Final   Coronavirus 229E NOT DETECTED NOT DETECTED Final   Coronavirus HKU1 NOT DETECTED NOT DETECTED Final   Coronavirus NL63 NOT DETECTED NOT DETECTED Final   Coronavirus OC43 NOT DETECTED NOT DETECTED Final   Metapneumovirus NOT DETECTED NOT DETECTED Final   Rhinovirus / Enterovirus NOT DETECTED NOT DETECTED Final   Influenza A NOT DETECTED NOT DETECTED Final   Influenza B NOT DETECTED NOT DETECTED Final   Parainfluenza Virus 1 NOT DETECTED NOT DETECTED Final   Parainfluenza Virus 2 NOT DETECTED NOT DETECTED Final   Parainfluenza Virus 3 NOT DETECTED NOT DETECTED Final   Parainfluenza Virus 4 NOT DETECTED NOT DETECTED Final   Respiratory Syncytial Virus NOT DETECTED NOT DETECTED Final   Bordetella pertussis NOT DETECTED NOT DETECTED Final    Chlamydophila pneumoniae NOT DETECTED NOT DETECTED Final   Mycoplasma pneumoniae NOT DETECTED NOT DETECTED Final       Radiology Studies: Dg Chest Portable 1 View  Result Date: 04/05/2017 CLINICAL DATA:  Cold like symptoms and possible asthma exacerbation. Wheezing. EXAM: PORTABLE CHEST 1 VIEW COMPARISON:  12/20/2016 FINDINGS: Lungs are clear. Cardiomediastinal silhouette and remainder the exam is unchanged. IMPRESSION: No active disease. Electronically Signed   By: Marin Olp M.D.   On: 04/05/2017 17:54      Scheduled Meds: . apixaban  5 mg Oral BID  . atorvastatin  40 mg Oral q1800  . budesonide (PULMICORT) nebulizer solution  0.25 mg Nebulization BID  . fluticasone  2 spray Each Nare BID  . gabapentin  300 mg  Oral BID  . ipratropium-albuterol  3 mL Nebulization Q4H  . methylPREDNISolone (SOLU-MEDROL) injection  60 mg Intravenous Q6H  . pantoprazole  40 mg Oral Daily   Continuous Infusions: . doxycycline (VIBRAMYCIN) IV 100 mg (04/07/17 0942)     LOS: 1 day    Time spent: 40 minutes   Dessa Phi, DO [facility] [email or web address] Password [email or web address] 04/07/2017, 1:08 PM

## 2017-04-08 LAB — CBC
HEMATOCRIT: 32.4 % — AB (ref 39.0–52.0)
HEMOGLOBIN: 10.3 g/dL — AB (ref 13.0–17.0)
MCH: 30.1 pg (ref 26.0–34.0)
MCHC: 31.8 g/dL (ref 30.0–36.0)
MCV: 94.7 fL (ref 78.0–100.0)
Platelets: 152 10*3/uL (ref 150–400)
RBC: 3.42 MIL/uL — AB (ref 4.22–5.81)
RDW: 14.4 % (ref 11.5–15.5)
WBC: 3 10*3/uL — ABNORMAL LOW (ref 4.0–10.5)

## 2017-04-08 LAB — BASIC METABOLIC PANEL
Anion gap: 9 (ref 5–15)
BUN: 20 mg/dL (ref 6–20)
CHLORIDE: 109 mmol/L (ref 101–111)
CO2: 19 mmol/L — AB (ref 22–32)
CREATININE: 0.83 mg/dL (ref 0.61–1.24)
Calcium: 9.2 mg/dL (ref 8.9–10.3)
GFR calc Af Amer: >60 mL/min (ref >60)
GFR calc non Af Amer: >60 mL/min (ref >60)
Glucose, Bld: 233 mg/dL — ABNORMAL HIGH (ref 65–99)
POTASSIUM: 4.3 mmol/L (ref 3.5–5.1)
Sodium: 137 mmol/L (ref 135–145)

## 2017-04-08 MED ORDER — MOMETASONE FURO-FORMOTEROL FUM 200-5 MCG/ACT IN AERO
2.0000 | INHALATION_SPRAY | Freq: Two times a day (BID) | RESPIRATORY_TRACT | Status: DC
  Administered 2017-04-08 – 2017-04-11 (×6): 2 via RESPIRATORY_TRACT
  Filled 2017-04-08: qty 8.8

## 2017-04-08 MED ORDER — METHYLPREDNISOLONE SODIUM SUCC 125 MG IJ SOLR
60.0000 mg | Freq: Two times a day (BID) | INTRAMUSCULAR | Status: DC
  Administered 2017-04-08 – 2017-04-09 (×2): 60 mg via INTRAVENOUS
  Filled 2017-04-08 (×2): qty 2

## 2017-04-08 NOTE — Progress Notes (Signed)
PROGRESS NOTE    [name]  HWE:[id number] DOB: 06-22-1960 DOA: 04/05/2017 PCP: Arnoldo Morale, MD     Brief Narrative:  [name] is a 57 yo male with past medical history of COPD, active tobacco use, coronary artery disease status post stent, chronic diastolic congestive heart failure, history of PE/DVT status post IVC filter on apixaban presented to ER with worsening shortness of breath, wheezing and coughing.  Patient also with clear productive cough and feels congested.  Chest x-ray unremarkable.  Admitted for COPD exacerbation.   Assessment & Plan:   Principal Problem:   COPD exacerbation ([place]) Active Problems:   History of pulmonary embolism   Anemia   HTN (hypertension)   Chronic hepatitis C without hepatic coma (HCC)   Chronic diastolic heart failure (HCC)   Acute COPD exacerbation -Respiratory viral study negative. -Continue IV solumederol, doxycycline, nebs, pulmicort  -Continues to be very wheezy diffusely   Acute respiratory failure with hypoxia -On room air this morning -Home O2 desat screen, ambulate in hall   History of recurrent DVT PE status post IVC filter  -Currently on Eliquis  Chronic diastolic congestive heart failure -Echo in February 2018 with EF of 55-60%  -Stable   HLD -Continue lipitor   Coronary artery disease status post stent -Stable   DVT prophylaxis: Eliquis Code Status: Full Family Communication: No family at bedside Disposition Plan: Pending improvement. Continue IV steroids another day due to diffuse wheezes    Consultants:   None  Procedures:   None   Antimicrobials:  Anti-infectives (From admission, onward)   Start     Dose/Rate Route Frequency Ordered Stop   04/07/17 2200  doxycycline (VIBRA-TABS) tablet 100 mg     100 mg Oral Every 12 hours 04/07/17 1312     04/06/17 0800  doxycycline (VIBRAMYCIN) 100 mg in dextrose 5 % 250 mL IVPB  Status:  Discontinued     100 mg 125 mL/hr over 120 Minutes  Intravenous Every 12 hours 04/05/17 2335 04/07/17 1312   04/05/17 2030  doxycycline (VIBRAMYCIN) 100 mg in dextrose 5 % 250 mL IVPB     100 mg 125 mL/hr over 120 Minutes Intravenous  Once 04/05/17 2027 04/06/17 0101       Subjective: Continues to have coughing fits, shortness of breath at rest. States he is winded when he is standing and walking to the bathroom    Objective: Vitals:   04/08/17 0435 04/08/17 0801 04/08/17 1140 04/08/17 1149  BP: (!) 142/87   (!) 150/83  Pulse: 80   75  Resp: 18   20  Temp: 97.9 F (36.6 C)   98.3 F (36.8 C)  TempSrc: Oral   Oral  SpO2: 98% 98% 98% 94%  Weight: 89.2 kg (196 lb 9.6 oz)     Height:        Intake/Output Summary (Last 24 hours) at 04/08/2017 1226 Last data filed at 04/08/2017 1000 Gross per 24 hour  Intake 630 ml  Output -  Net 630 ml   Filed Weights   04/06/17 1725 04/07/17 0632 04/08/17 0435  Weight: 86.7 kg (191 lb 1.6 oz) 87.7 kg (193 lb 6.4 oz) 89.2 kg (196 lb 9.6 oz)    Examination:  General exam: Appears calm and comfortable  Respiratory system: Diffuse wheezes bilaterally, no distress Cardiovascular system: S1 & S2 heard, RRR. No JVD, murmurs, rubs, gallops or clicks. No pedal edema. Gastrointestinal system: Abdomen is nondistended, soft and nontender. No organomegaly or masses felt. Normal bowel sounds  heard. Central nervous system: Alert and oriented. No focal neurological deficits. Extremities: Symmetric 5 x 5 power. Skin: No rashes, lesions or ulcers Psychiatry: Judgement and insight appear normal. Mood & affect appropriate.   Data Reviewed: I have personally reviewed following labs and imaging studies  CBC: Recent Labs  Lab 04/05/17 2245 04/05/17 2251 04/06/17 0626 04/08/17 0854  WBC 4.3  --  3.2* 3.0*  NEUTROABS 3.2  --  2.6  --   HGB 11.0* 9.9* 10.4* 10.3*  HCT 34.0* 29.0* 32.6* 32.4*  MCV 93.7  --  92.9 94.7  PLT 154  --  149* 440   Basic Metabolic Panel: Recent Labs  Lab 04/05/17 2251  04/06/17 0626 04/07/17 0323 04/08/17 0854  NA 144 134* 134* 137  K 3.2* 4.4 4.7 4.3  CL 114* 104 107 109  CO2  --  17* 20* 19*  GLUCOSE 105* 191* 143* 233*  BUN 6 9 18 20   CREATININE 0.60* 0.83 0.99 0.83  CALCIUM  --  8.8* 8.9 9.2  MG  --  2.2  --   --    GFR: Estimated Creatinine Clearance: 115.5 mL/min (by C-G formula based on SCr of 0.83 mg/dL). Liver Function Tests: No results for input(s): AST, ALT, ALKPHOS, BILITOT, PROT, ALBUMIN in the last 168 hours. No results for input(s): LIPASE, AMYLASE in the last 168 hours. No results for input(s): AMMONIA in the last 168 hours. Coagulation Profile: No results for input(s): INR, PROTIME in the last 168 hours. Cardiac Enzymes: Recent Labs  Lab 04/06/17 0626  TROPONINI <0.03   BNP (last 3 results) No results for input(s): PROBNP in the last 8760 hours. HbA1C: No results for input(s): HGBA1C in the last 72 hours. CBG: No results for input(s): GLUCAP in the last 168 hours. Lipid Profile: No results for input(s): CHOL, HDL, LDLCALC, TRIG, CHOLHDL, LDLDIRECT in the last 72 hours. Thyroid Function Tests: No results for input(s): TSH, T4TOTAL, FREET4, T3FREE, THYROIDAB in the last 72 hours. Anemia Panel: Recent Labs    04/06/17 0626  VITAMINB12 416  FOLATE 8.6  FERRITIN 44  TIBC 353  IRON 62  RETICCTPCT 1.5   Sepsis Labs: No results for input(s): PROCALCITON, LATICACIDVEN in the last 168 hours.  Recent Results (from the past 240 hour(s))  Respiratory Panel by PCR     Status: None   Collection Time: 04/06/17  5:32 AM  Result Value Ref Range Status   Adenovirus NOT DETECTED NOT DETECTED Final   Coronavirus 229E NOT DETECTED NOT DETECTED Final   Coronavirus HKU1 NOT DETECTED NOT DETECTED Final   Coronavirus NL63 NOT DETECTED NOT DETECTED Final   Coronavirus OC43 NOT DETECTED NOT DETECTED Final   Metapneumovirus NOT DETECTED NOT DETECTED Final   Rhinovirus / Enterovirus NOT DETECTED NOT DETECTED Final   Influenza A NOT  DETECTED NOT DETECTED Final   Influenza B NOT DETECTED NOT DETECTED Final   Parainfluenza Virus 1 NOT DETECTED NOT DETECTED Final   Parainfluenza Virus 2 NOT DETECTED NOT DETECTED Final   Parainfluenza Virus 3 NOT DETECTED NOT DETECTED Final   Parainfluenza Virus 4 NOT DETECTED NOT DETECTED Final   Respiratory Syncytial Virus NOT DETECTED NOT DETECTED Final   Bordetella pertussis NOT DETECTED NOT DETECTED Final   Chlamydophila pneumoniae NOT DETECTED NOT DETECTED Final   Mycoplasma pneumoniae NOT DETECTED NOT DETECTED Final       Radiology Studies: No results found.    Scheduled Meds: . apixaban  5 mg Oral BID  . atorvastatin  40 mg Oral q1800  . budesonide (PULMICORT) nebulizer solution  0.25 mg Nebulization BID  . doxycycline  100 mg Oral Q12H  . fluticasone  2 spray Each Nare BID  . gabapentin  300 mg Oral BID  . ipratropium-albuterol  3 mL Nebulization Q4H  . methylPREDNISolone (SOLU-MEDROL) injection  60 mg Intravenous Q6H  . pantoprazole  40 mg Oral Daily   Continuous Infusions:    LOS: 2 days    Time spent: 20 minutes   Dessa Phi, DO [facility] [email or web address] Password [email or web address] 04/08/2017, 12:26 PM

## 2017-04-09 MED ORDER — METHYLPREDNISOLONE SODIUM SUCC 125 MG IJ SOLR
60.0000 mg | Freq: Every day | INTRAMUSCULAR | Status: DC
  Administered 2017-04-10 – 2017-04-11 (×2): 60 mg via INTRAVENOUS
  Filled 2017-04-09 (×2): qty 2

## 2017-04-09 NOTE — Progress Notes (Signed)
Pt is stable and vitals stable. No any complain of pain so far, denies distress, ambulatory in a hallway, pain medicine given around the clock for a complain of back pain, will continue to monitor the patient  Palma Holter, RN

## 2017-04-09 NOTE — Progress Notes (Signed)
PROGRESS NOTE    [name]  WFU:[id number] DOB: [birth year] DOA: 04/05/2017 PCP: Arnoldo Morale, MD     Brief Narrative:  [name] is a 57 yo male with past medical history of COPD, active tobacco use, coronary artery disease status post stent, chronic diastolic congestive heart failure, history of PE/DVT status post IVC filter on apixaban presented to ER with worsening shortness of breath, wheezing and coughing.  Patient also with clear productive cough and feels congested.  Chest x-ray unremarkable.  Admitted for COPD exacerbation.   Assessment & Plan:   Principal Problem:   COPD exacerbation ([place]) Active Problems:   History of pulmonary embolism   Anemia   HTN (hypertension)   Chronic hepatitis C without hepatic coma (HCC)   Chronic diastolic heart failure (HCC)   Acute COPD exacerbation -Respiratory viral study negative. -Continue IV solumederol, doxycycline, nebs, pulmicort  -Continues to be very wheezy diffusely, although slightly improved from yesterday. 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  -Currently on Eliquis  Chronic diastolic congestive heart failure -Echo in February 2018 with EF of 55-60%  -Stable   HLD -Continue lipitor   Coronary artery disease status post stent -Stable   DVT prophylaxis: Eliquis Code Status: Full Family Communication: No family at bedside Disposition Plan: Pending improvement   Consultants:   None  Procedures:   None   Antimicrobials:  Anti-infectives (From admission, onward)   Start     Dose/Rate Route Frequency Ordered Stop   04/07/17 2200  doxycycline (VIBRA-TABS) tablet 100 mg     100 mg Oral Every 12 hours 04/07/17 1312     04/06/17 0800  doxycycline (VIBRAMYCIN) 100 mg in dextrose 5 % 250 mL IVPB  Status:  Discontinued     100 mg 125 mL/hr over 120  Minutes Intravenous Every 12 hours 04/05/17 2335 04/07/17 1312   04/05/17 2030  doxycycline (VIBRAMYCIN) 100 mg in dextrose 5 % 250 mL IVPB     100 mg 125 mL/hr over 120 Minutes Intravenous  Once 04/05/17 2027 04/06/17 0101       Subjective: States his breathing is slightly better, although he continues to have coughing fits, wheezes, shortness of breath.    Objective: Vitals:   04/08/17 2017 04/09/17 0213 04/09/17 0527 04/09/17 0819  BP:   137/79   Pulse:   76 81  Resp:   18 18  Temp:   98.1 F (36.7 C)   TempSrc:   Oral   SpO2: 99%  98% 98%  Weight:  90.4 kg (199 lb 4.8 oz)    Height:        Intake/Output Summary (Last 24 hours) at 04/09/2017 1125 Last data filed at 04/08/2017 1804 Gross per 24 hour  Intake 480 ml  Output -  Net 480 ml   Filed Weights   04/07/17 2025 04/08/17 0435 04/09/17 4270  Weight: 87.7 kg (193 lb 6.4 oz) 89.2 kg (196 lb 9.6 oz) 90.4 kg (199 lb 4.8 oz)    Examination:  General exam: Appears calm and comfortable  Respiratory system: Diffuse wheezes bilaterally, slightly improved today compared to yesterday, no distress Cardiovascular system: S1 & S2 heard, RRR. No JVD, murmurs, rubs, gallops or clicks. No pedal edema. Gastrointestinal system: Abdomen is nondistended, soft and nontender. No organomegaly or masses felt. Normal bowel sounds heard. Central nervous system: Alert  and oriented. No focal neurological deficits. Extremities: Symmetric 5 x 5 power. Skin: No rashes, lesions or ulcers Psychiatry: Judgement and insight appear normal. Mood & affect appropriate.   Data Reviewed: I have personally reviewed following labs and imaging studies  CBC: Recent Labs  Lab 04/05/17 2245 04/05/17 2251 04/06/17 0626 04/08/17 0854  WBC 4.3  --  3.2* 3.0*  NEUTROABS 3.2  --  2.6  --   HGB 11.0* 9.9* 10.4* 10.3*  HCT 34.0* 29.0* 32.6* 32.4*  MCV 93.7  --  92.9 94.7  PLT 154  --  149* 096   Basic Metabolic Panel: Recent Labs  Lab 04/05/17 2251  04/06/17 0626 04/07/17 0323 04/08/17 0854  NA 144 134* 134* 137  K 3.2* 4.4 4.7 4.3  CL 114* 104 107 109  CO2  --  17* 20* 19*  GLUCOSE 105* 191* 143* 233*  BUN 6 9 18 20   CREATININE 0.60* 0.83 0.99 0.83  CALCIUM  --  8.8* 8.9 9.2  MG  --  2.2  --   --    GFR: Estimated Creatinine Clearance: 115.5 mL/min (by C-G formula based on SCr of 0.83 mg/dL). Liver Function Tests: No results for input(s): AST, ALT, ALKPHOS, BILITOT, PROT, ALBUMIN in the last 168 hours. No results for input(s): LIPASE, AMYLASE in the last 168 hours. No results for input(s): AMMONIA in the last 168 hours. Coagulation Profile: No results for input(s): INR, PROTIME in the last 168 hours. Cardiac Enzymes: Recent Labs  Lab 04/06/17 0626  TROPONINI <0.03   BNP (last 3 results) No results for input(s): PROBNP in the last 8760 hours. HbA1C: No results for input(s): HGBA1C in the last 72 hours. CBG: No results for input(s): GLUCAP in the last 168 hours. Lipid Profile: No results for input(s): CHOL, HDL, LDLCALC, TRIG, CHOLHDL, LDLDIRECT in the last 72 hours. Thyroid Function Tests: No results for input(s): TSH, T4TOTAL, FREET4, T3FREE, THYROIDAB in the last 72 hours. Anemia Panel: No results for input(s): VITAMINB12, FOLATE, FERRITIN, TIBC, IRON, RETICCTPCT in the last 72 hours. Sepsis Labs: No results for input(s): PROCALCITON, LATICACIDVEN in the last 168 hours.  Recent Results (from the past 240 hour(s))  Respiratory Panel by PCR     Status: None   Collection Time: 04/06/17  5:32 AM  Result Value Ref Range Status   Adenovirus NOT DETECTED NOT DETECTED Final   Coronavirus 229E NOT DETECTED NOT DETECTED Final   Coronavirus HKU1 NOT DETECTED NOT DETECTED Final   Coronavirus NL63 NOT DETECTED NOT DETECTED Final   Coronavirus OC43 NOT DETECTED NOT DETECTED Final   Metapneumovirus NOT DETECTED NOT DETECTED Final   Rhinovirus / Enterovirus NOT DETECTED NOT DETECTED Final   Influenza A NOT DETECTED NOT  DETECTED Final   Influenza B NOT DETECTED NOT DETECTED Final   Parainfluenza Virus 1 NOT DETECTED NOT DETECTED Final   Parainfluenza Virus 2 NOT DETECTED NOT DETECTED Final   Parainfluenza Virus 3 NOT DETECTED NOT DETECTED Final   Parainfluenza Virus 4 NOT DETECTED NOT DETECTED Final   Respiratory Syncytial Virus NOT DETECTED NOT DETECTED Final   Bordetella pertussis NOT DETECTED NOT DETECTED Final   Chlamydophila pneumoniae NOT DETECTED NOT DETECTED Final   Mycoplasma pneumoniae NOT DETECTED NOT DETECTED Final       Radiology Studies: No results found.    Scheduled Meds: . apixaban  5 mg Oral BID  . atorvastatin  40 mg Oral q1800  . doxycycline  100 mg Oral Q12H  . fluticasone  2  spray Each Nare BID  . gabapentin  300 mg Oral BID  . ipratropium-albuterol  3 mL Nebulization Q4H  . [START ON 04/10/2017] methylPREDNISolone (SOLU-MEDROL) injection  60 mg Intravenous Daily  . mometasone-formoterol  2 puff Inhalation BID  . pantoprazole  40 mg Oral Daily   Continuous Infusions:    LOS: 3 days    Time spent: 20 minutes   Dessa Phi, DO [facility] [email or web address] Password [facility] 04/09/2017, 11:25 AM

## 2017-04-09 NOTE — Progress Notes (Signed)
Prns given for cough and chest wall pain. Uneventful night so far not distress with sleep.

## 2017-04-10 ENCOUNTER — Telehealth: Payer: Self-pay | Admitting: Family Medicine

## 2017-04-10 DIAGNOSIS — J441 Chronic obstructive pulmonary disease with (acute) exacerbation: Principal | ICD-10-CM

## 2017-04-10 DIAGNOSIS — D500 Iron deficiency anemia secondary to blood loss (chronic): Secondary | ICD-10-CM

## 2017-04-10 DIAGNOSIS — I5032 Chronic diastolic (congestive) heart failure: Secondary | ICD-10-CM

## 2017-04-10 LAB — BASIC METABOLIC PANEL
Anion gap: 9 (ref 5–15)
BUN: 26 mg/dL — ABNORMAL HIGH (ref 6–20)
CALCIUM: 8.5 mg/dL — AB (ref 8.9–10.3)
CO2: 23 mmol/L (ref 22–32)
CREATININE: 0.88 mg/dL (ref 0.61–1.24)
Chloride: 105 mmol/L (ref 101–111)
GFR calc non Af Amer: >60 mL/min (ref >60)
Glucose, Bld: 99 mg/dL (ref 65–99)
Potassium: 4 mmol/L (ref 3.5–5.1)
SODIUM: 137 mmol/L (ref 135–145)

## 2017-04-10 LAB — CBC
HCT: 32.2 % — ABNORMAL LOW (ref 39.0–52.0)
Hemoglobin: 10.4 g/dL — ABNORMAL LOW (ref 13.0–17.0)
MCH: 30.2 pg (ref 26.0–34.0)
MCHC: 32.3 g/dL (ref 30.0–36.0)
MCV: 93.6 fL (ref 78.0–100.0)
PLATELETS: 166 10*3/uL (ref 150–400)
RBC: 3.44 MIL/uL — ABNORMAL LOW (ref 4.22–5.81)
RDW: 14.2 % (ref 11.5–15.5)
WBC: 5 10*3/uL (ref 4.0–10.5)

## 2017-04-10 MED ORDER — IPRATROPIUM-ALBUTEROL 0.5-2.5 (3) MG/3ML IN SOLN
3.0000 mL | Freq: Two times a day (BID) | RESPIRATORY_TRACT | Status: DC
  Administered 2017-04-10 – 2017-04-11 (×2): 3 mL via RESPIRATORY_TRACT
  Filled 2017-04-10: qty 3

## 2017-04-10 MED ORDER — DOXYCYCLINE HYCLATE 100 MG PO TABS: 100.0000 mg | ORAL_TABLET | Freq: Two times a day (BID) | ORAL | 0 refills | Status: AC

## 2017-04-10 MED ORDER — PREDNISONE 10 MG PO TABS: ORAL_TABLET | ORAL | 0 refills | Status: DC

## 2017-04-10 MED ORDER — DM-GUAIFENESIN ER 30-600 MG PO TB12: 1.0000 | ORAL_TABLET | Freq: Two times a day (BID) | ORAL | 0 refills | Status: DC | PRN

## 2017-04-10 NOTE — Discharge Summary (Signed)
Physician Discharge Summary  [name] MRN: [id number] DOB/AGE: April 07, 1960 57 y.o.  PCP: Arnoldo Morale, MD   Admit date: 04/05/2017 Discharge date: 04/10/2017  Discharge Diagnoses:    Principal Problem:   COPD exacerbation ([place]) Active Problems:   History of pulmonary embolism   Anemia   HTN (hypertension)   Chronic hepatitis C without hepatic coma (HCC)   Chronic diastolic heart failure ([place])    Follow-up recommendations Follow-up with PCP in 3-5 days , including all  additional recommended appointments as below Follow-up CBC, CMP in 3-5 days   Allergies as of 04/11/2017      Reactions   No Known Allergies       Medication List    TAKE these medications   acetaminophen 325 MG tablet Commonly known as:  TYLENOL Take 650 mg by mouth every 6 (six) hours as needed for mild pain.   albuterol (2.5 MG/3ML) 0.083% nebulizer solution Commonly known as:  PROVENTIL Take 3 mLs (2.5 mg total) every 6 (six) hours as needed by nebulization for wheezing or shortness of breath.   VENTOLIN HFA 108 (90 Base) MCG/ACT inhaler Generic drug:  albuterol Inhale 2 puffs every 6 (six) hours as needed into the lungs for wheezing or shortness of breath.   apixaban 5 MG Tabs tablet Commonly known as:  ELIQUIS Take 1 tablet (5 mg total) 2 (two) times daily by mouth.   atorvastatin 40 MG tablet Commonly known as:  LIPITOR Take 1 tablet (40 mg total) daily at 6 PM by mouth.   dextromethorphan-guaiFENesin 30-600 MG 12hr tablet Commonly known as:  MUCINEX DM Take 1 tablet by mouth 2 (two) times daily as needed for cough.   diclofenac sodium 1 % Gel Commonly known as:  VOLTAREN Apply 4 g 4 (four) times daily topically.   doxycycline 100 MG tablet Commonly known as:  VIBRA-TABS Take 1 tablet (100 mg total) by mouth every 12 (twelve) hours for 5 days.   fluticasone 50 MCG/ACT nasal spray Commonly known as:  FLONASE Place 2 sprays into both nostrils 2 (two) times daily.    Fluticasone-Salmeterol 250-50 MCG/DOSE Aepb Commonly known as:  ADVAIR DISKUS Inhale 1 puff 2 (two) times daily into the lungs.   gabapentin 300 MG capsule Commonly known as:  NEURONTIN Take 1 capsule (300 mg total) 2 (two) times daily by mouth.   pantoprazole 40 MG tablet Commonly known as:  PROTONIX Take 40 mg by mouth daily.   predniSONE 10 MG tablet Commonly known as:  DELTASONE 6 tablets for 3 days, 5 tablets for 3 days, 4 tablets for 3 days, 3 tablets for 3 days, 2 tablets for 3 days, 1 tablet for 3 days then discontinue   tiZANidine 4 MG tablet Commonly known as:  ZANAFLEX Take 1 tablet (4 mg total) every 8 (eight) hours as needed by mouth for muscle spasms.   traMADol 50 MG tablet Commonly known as:  ULTRAM Take 1 tablet (50 mg total) by mouth every 6 (six) hours as needed.              Discharge Condition:  Stable  Discharge Instructions Get Medicines reviewed and adjusted: Please take all your medications with you for your next visit with your Primary MD  Please request your Primary MD to go over all hospital tests and procedure/radiological results at the follow up, please ask your Primary MD to get all Hospital records sent to his/her office.  If you experience worsening of your admission symptoms, develop shortness of  breath, life threatening emergency, suicidal or homicidal thoughts you must seek medical attention immediately by calling 911 or calling your MD immediately if symptoms less severe.  You must read complete instructions/literature along with all the possible adverse reactions/side effects for all the Medicines you take and that have been prescribed to you. Take any new Medicines after you have completely understood and accpet all the possible adverse reactions/side effects.   Do not drive when taking Pain medications.   Do not take more than prescribed Pain, Sleep and Anxiety Medications  Special Instructions: If you have smoked or chewed  Tobacco in the last 2 yrs please stop smoking, stop any regular Alcohol and or any Recreational drug use.  Wear Seat belts while driving.  Please note  You were cared for by a hospitalist during your hospital stay. Once you are discharged, your primary care physician will handle any further medical issues. Please note that NO REFILLS for any discharge medications will be authorized once you are discharged, as it is imperative that you return to your primary care physician (or establish a relationship with a primary care physician if you do not have one) for your aftercare needs so that they can reassess your need for medications and monitor your lab values.     Allergies  Allergen Reactions  . No Known Allergies       Disposition: 01-Home or Self Care   Consults:  None    Significant Diagnostic Studies:  Dg Chest Portable 1 View  Result Date: 04/05/2017 CLINICAL DATA:  Cold like symptoms and possible asthma exacerbation. Wheezing. EXAM: PORTABLE CHEST 1 VIEW COMPARISON:  12/20/2016 FINDINGS: Lungs are clear. Cardiomediastinal silhouette and remainder the exam is unchanged. IMPRESSION: No active disease. Electronically Signed   By: Marin Olp M.D.   On: 04/05/2017 17:54        Filed Weights   04/08/17 0435 04/09/17 0213 04/10/17 0542  Weight: 89.2 kg (196 lb 9.6 oz) 90.4 kg (199 lb 4.8 oz) 92.2 kg (203 lb 4.8 oz)     Microbiology: Recent Results (from the past 240 hour(s))  Respiratory Panel by PCR     Status: None   Collection Time: 04/06/17  5:32 AM  Result Value Ref Range Status   Adenovirus NOT DETECTED NOT DETECTED Final   Coronavirus 229E NOT DETECTED NOT DETECTED Final   Coronavirus HKU1 NOT DETECTED NOT DETECTED Final   Coronavirus NL63 NOT DETECTED NOT DETECTED Final   Coronavirus OC43 NOT DETECTED NOT DETECTED Final   Metapneumovirus NOT DETECTED NOT DETECTED Final   Rhinovirus / Enterovirus NOT DETECTED NOT DETECTED Final   Influenza A NOT DETECTED  NOT DETECTED Final   Influenza B NOT DETECTED NOT DETECTED Final   Parainfluenza Virus 1 NOT DETECTED NOT DETECTED Final   Parainfluenza Virus 2 NOT DETECTED NOT DETECTED Final   Parainfluenza Virus 3 NOT DETECTED NOT DETECTED Final   Parainfluenza Virus 4 NOT DETECTED NOT DETECTED Final   Respiratory Syncytial Virus NOT DETECTED NOT DETECTED Final   Bordetella pertussis NOT DETECTED NOT DETECTED Final   Chlamydophila pneumoniae NOT DETECTED NOT DETECTED Final   Mycoplasma pneumoniae NOT DETECTED NOT DETECTED Final       Blood Culture    Component Value Date/Time   SDES BLOOD LEFT HAND 11/06/2015 0407   SPECREQUEST BOTTLES DRAWN AEROBIC ONLY 5CC 11/06/2015 0407   CULT  11/06/2015 0407    NO GROWTH 5 DAYS Performed at [facility] 11/11/2015 FINAL 11/06/2015  0407      Labs: Results for orders placed or performed during the hospital encounter of 04/05/17 (from the past 48 hour(s))  CBC     Status: Abnormal   Collection Time: 04/08/17  8:54 AM  Result Value Ref Range   WBC 3.0 (L) 4.0 - 10.5 K/uL   RBC 3.42 (L) 4.22 - 5.81 MIL/uL   Hemoglobin 10.3 (L) 13.0 - 17.0 g/dL   HCT 32.4 (L) 39.0 - 52.0 %   MCV 94.7 78.0 - 100.0 fL   MCH 30.1 26.0 - 34.0 pg   MCHC 31.8 30.0 - 36.0 g/dL   RDW 14.4 11.5 - 15.5 %   Platelets 152 150 - 400 K/uL  Basic metabolic panel     Status: Abnormal   Collection Time: 04/08/17  8:54 AM  Result Value Ref Range   Sodium 137 135 - 145 mmol/L   Potassium 4.3 3.5 - 5.1 mmol/L   Chloride 109 101 - 111 mmol/L   CO2 19 (L) 22 - 32 mmol/L   Glucose, Bld 233 (H) 65 - 99 mg/dL   BUN 20 6 - 20 mg/dL   Creatinine, Ser 0.83 0.61 - 1.24 mg/dL   Calcium 9.2 8.9 - 10.3 mg/dL   GFR calc non Af Amer >60 >60 mL/min   GFR calc Af Amer >60 >60 mL/min    Comment: (NOTE) The eGFR has been calculated using the CKD EPI equation. This calculation has not been validated in all clinical situations. eGFR's persistently <60 mL/min signify  possible Chronic Kidney Disease.    Anion gap 9 5 - 15  CBC     Status: Abnormal   Collection Time: 04/10/17  5:55 AM  Result Value Ref Range   WBC 5.0 4.0 - 10.5 K/uL   RBC 3.44 (L) 4.22 - 5.81 MIL/uL   Hemoglobin 10.4 (L) 13.0 - 17.0 g/dL   HCT 32.2 (L) 39.0 - 52.0 %   MCV 93.6 78.0 - 100.0 fL   MCH 30.2 26.0 - 34.0 pg   MCHC 32.3 30.0 - 36.0 g/dL   RDW 14.2 11.5 - 15.5 %   Platelets 166 150 - 400 K/uL  Basic metabolic panel     Status: Abnormal   Collection Time: 04/10/17  5:55 AM  Result Value Ref Range   Sodium 137 135 - 145 mmol/L   Potassium 4.0 3.5 - 5.1 mmol/L   Chloride 105 101 - 111 mmol/L   CO2 23 22 - 32 mmol/L   Glucose, Bld 99 65 - 99 mg/dL   BUN 26 (H) 6 - 20 mg/dL   Creatinine, Ser 0.88 0.61 - 1.24 mg/dL   Calcium 8.5 (L) 8.9 - 10.3 mg/dL   GFR calc non Af Amer >60 >60 mL/min   GFR calc Af Amer >60 >60 mL/min    Comment: (NOTE) The eGFR has been calculated using the CKD EPI equation. This calculation has not been validated in all clinical situations. eGFR's persistently <60 mL/min signify possible Chronic Kidney Disease.    Anion gap 9 5 - 15     Lipid Panel     Component Value Date/Time   CHOL 206 (H) 08/03/2016 1244   TRIG 98 08/03/2016 1244   HDL 57 08/03/2016 1244   CHOLHDL 3.6 08/03/2016 1244   CHOLHDL 3.1 01/01/2015 0208   VLDL 12 01/01/2015 0208   LDLCALC 129 (H) 08/03/2016 1244     Lab Results  Component Value Date   HGBA1C 5.4 05/16/2016   HGBA1C 5.6  04/27/2014   HGBA1C 5.6 02/10/2012     Lab Results  Component Value Date   LDLCALC 129 (H) 08/03/2016   CREATININE 0.88 04/10/2017     HPI    [name] is a 57 y.o. male with history of COPD with ongoing tobacco abuse, CAD status post stenting, chronic diastolic CHF, history of PE/DVT status post IVC filter placement on apixaban presents to the ER because of worsening shortness of breath last few days.  Patient states last week he had upper respiratory tract  infection-like symptoms which improved but wheezing worsened.  Denies any chest pain.  Has been having productive cough.  Denies any fever or chills.  ED Course: In the ER patient is found to be wheezing diffusely.  Chest x-ray was unremarkable.  Patient was started on nebulizer treatment IV steroids and antibiotics and admitted for acute COPD exacerbation.    HOSPITAL COURSE:  Acute COPD exacerbation -Respiratory viral study negative. -Treated with supportive therapy such as IV solumederol, doxycycline, nebs, pulmicort . Now converted to prednisone taper with oral doxycycline for another 5 days. -Wheezing seems to have improved compared to  yesterday.  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 rate 18, height _0  (1.88 m), weight 92.2 kg (203 lb 4.8 oz), SpO2 100 %.  General exam: Appears calm and comfortable  Respiratory system: Diffuse wheezes bilaterally, slightly improved today compared to yesterday, no distress Cardiovascular system: S1 & S2 heard, RRR. No JVD, murmurs, rubs, gallops or clicks. No pedal edema. Gastrointestinal system: Abdomen is nondistended, soft and nontender. No organomegaly or masses felt. Normal bowel sounds heard. Central nervous system: Alert and oriented. No focal neurological deficits.      Follow-up Information    Arnoldo Morale, MD. Call.   Specialty:  Family Medicine Why:  Setup at earliest possible appointment for hospital follow-up in 3-5  days Contact information: [address] [place] [address] [phone]           Signed: Reyne Dumas 04/10/2017, 8:18 AM        Time spent >1 hour

## 2017-04-10 NOTE — Telephone Encounter (Signed)
Met with patient in the hospital room. Introduce and explain myself and the T[facility] clinic at [facility]. Patient agreed to be seen by provider.  Scheduled an appointment on 04/19/16 _0 :45am.   Patient stated that he is doing better but is still weak. Patient currently resides in a one story, 3 bedroom house.  He lives there with his brother and roommate. Patient doesn't work. He receives $900 of disability and $130 of food stamps. Patient is aware of his condition and has some help from his brother. Brother works at [employer] and is gone from [employer]. Patient cooks and takes care of himself.  Patient has no scale at home and doesn't use cane or walker, although he sometimes has problems going up a stairs and/or hill. Patient stated that tries to limit his walking because "of the blood clots in my legs". Patient confirmed that he uses [facility] on [address] and [place]. He also confirmed that Gardenia Phlegm is his emergency contact and that the number we have on Epic [phone]) is correct. Patient uses Social Service transportation [facility]) to get to doctor's appointments.   Informed patient that we will reach out to him to follow up on how he is doing and also remind him of his appointment.

## 2017-04-10 NOTE — Care Management Important Message (Signed)
Important Message  Patient Details  Name: [name] MRN: [id number] Date of Birth: 23-Jun-1960   Medicare Important Message Given:  Yes    Abeeha Twist Montine Circle 04/10/2017, 12:33 PM

## 2017-04-10 NOTE — Progress Notes (Signed)
PROGRESS NOTE    [name]  TGG:[id number] DOB: September 25, 1960 DOA: 04/05/2017 PCP: Arnoldo Morale, MD     Brief Narrative:  [name] is a 57 yo male with past medical history of COPD, active tobacco use, coronary artery disease status post stent, chronic diastolic congestive heart failure, history of PE/DVT status post IVC filter on apixaban presented to ER with worsening shortness of breath, wheezing and coughing.  Patient also with clear productive cough and feels congested.  Chest x-ray unremarkable.  Admitted for COPD exacerbation.   Assessment & Plan:   Principal Problem:   COPD exacerbation ([place]) Active Problems:   History of pulmonary embolism   Anemia   HTN (hypertension)   Chronic hepatitis C without hepatic coma (HCC)   Chronic diastolic heart failure [facility])   HOSPITAL COURSE:  Acute COPD exacerbation -Respiratory viral study negative. -Treated with supportive therapy such as IV solumederol, doxycycline, nebs, pulmicort . Now converted to prednisone taper with oral doxycycline for another 5 days. -Wheezing seems to have improved compared to  yesterday.  Room air oxygen saturation stable prior to discharge  Acute respiratory failure with hypoxia -On room air this morning -Home O2 desat screen, ambulate in hall   History of recurrent DVT PE status post IVC filter  -Currently on Eliquis  Chronic diastolic congestive heart failure -Echo in February 2018 with EF of 55-60%  -Stable   HLD -Continue lipitor   Coronary artery disease status post stent -Stable   DVT prophylaxis: Eliquis Code Status: Full Family Communication: No family at bedside Disposition Plan:  Patient really is not ready to go today will discharge home tomorrow   Consultants:   None  Procedures:   None   Antimicrobials:  Anti-infectives (From admission, onward)   Start     Dose/Rate Route Frequency Ordered Stop   04/10/17 0000  doxycycline (VIBRA-TABS) 100 MG tablet       100 mg Oral Every 12 hours 04/10/17 0816 04/15/17 2359   04/07/17 2200  doxycycline (VIBRA-TABS) tablet 100 mg     100 mg Oral Every 12 hours 04/07/17 1312     04/06/17 0800  doxycycline (VIBRAMYCIN) 100 mg in dextrose 5 % 250 mL IVPB  Status:  Discontinued     100 mg 125 mL/hr over 120 Minutes Intravenous Every 12 hours 04/05/17 2335 04/07/17 1312   04/05/17 2030  doxycycline (VIBRAMYCIN) 100 mg in dextrose 5 % 250 mL IVPB     100 mg 125 mL/hr over 120 Minutes Intravenous  Once 04/05/17 2027 04/06/17 0101       Subjective: Continues to have dyspnea on exertion with nonproductive cough, slight upper airway wheezing    Objective: Vitals:   04/09/17 2352 04/10/17 0403 04/10/17 0542 04/10/17 0854  BP: (!) 150/80  (!) 151/91   Pulse:   70 78  Resp: 18  18 18   Temp: 98 F (36.7 C)  98.3 F (36.8 C)   TempSrc: Oral  Oral   SpO2: 98% 98% 100% 97%  Weight:   92.2 kg (203 lb 4.8 oz)   Height:        Intake/Output Summary (Last 24 hours) at 04/10/2017 1214 Last data filed at 04/10/2017 0920 Gross per 24 hour  Intake 660 ml  Output 1020 ml  Net -360 ml   Filed Weights   04/08/17 0435 04/09/17 0213 04/10/17 0542  Weight: 89.2 kg (196 lb 9.6 oz) 90.4 kg (199 lb 4.8 oz) 92.2 kg (203 lb 4.8 oz)    Examination:  General exam: Appears calm and comfortable  Respiratory system: Diffuse wheezes bilaterally, slightly improved today compared to yesterday, no distress. Upper airway wheezing Cardiovascular system: S1 & S2 heard, RRR. No JVD, murmurs, rubs, gallops or clicks. No pedal edema. Gastrointestinal system: Abdomen is nondistended, soft and nontender. No organomegaly or masses felt. Normal bowel sounds heard. Central nervous system: Alert and oriented. No focal neurological deficits. Extremities: Symmetric 5 x 5 power. Skin: No rashes, lesions or ulcers Psychiatry: Judgement and insight appear normal. Mood & affect appropriate.   Data Reviewed: I have personally reviewed  following labs and imaging studies  CBC: Recent Labs  Lab 04/05/17 2245 04/05/17 2251 04/06/17 0626 04/08/17 0854 04/10/17 0555  WBC 4.3  --  3.2* 3.0* 5.0  NEUTROABS 3.2  --  2.6  --   --   HGB 11.0* 9.9* 10.4* 10.3* 10.4*  HCT 34.0* 29.0* 32.6* 32.4* 32.2*  MCV 93.7  --  92.9 94.7 93.6  PLT 154  --  149* 152 124   Basic Metabolic Panel: Recent Labs  Lab 04/05/17 2251 04/06/17 0626 04/07/17 0323 04/08/17 0854 04/10/17 0555  NA 144 134* 134* 137 137  K 3.2* 4.4 4.7 4.3 4.0  CL 114* 104 107 109 105  CO2  --  17* 20* 19* 23  GLUCOSE 105* 191* 143* 233* 99  BUN 6 9 18 20  26*  CREATININE 0.60* 0.83 0.99 0.83 0.88  CALCIUM  --  8.8* 8.9 9.2 8.5*  MG  --  2.2  --   --   --    GFR: Estimated Creatinine Clearance: 109 mL/min (by C-G formula based on SCr of 0.88 mg/dL). Liver Function Tests: No results for input(s): AST, ALT, ALKPHOS, BILITOT, PROT, ALBUMIN in the last 168 hours. No results for input(s): LIPASE, AMYLASE in the last 168 hours. No results for input(s): AMMONIA in the last 168 hours. Coagulation Profile: No results for input(s): INR, PROTIME in the last 168 hours. Cardiac Enzymes: Recent Labs  Lab 04/06/17 0626  TROPONINI <0.03   BNP (last 3 results) No results for input(s): PROBNP in the last 8760 hours. HbA1C: No results for input(s): HGBA1C in the last 72 hours. CBG: No results for input(s): GLUCAP in the last 168 hours. Lipid Profile: No results for input(s): CHOL, HDL, LDLCALC, TRIG, CHOLHDL, LDLDIRECT in the last 72 hours. Thyroid Function Tests: No results for input(s): TSH, T4TOTAL, FREET4, T3FREE, THYROIDAB in the last 72 hours. Anemia Panel: No results for input(s): VITAMINB12, FOLATE, FERRITIN, TIBC, IRON, RETICCTPCT in the last 72 hours. Sepsis Labs: No results for input(s): PROCALCITON, LATICACIDVEN in the last 168 hours.  Recent Results (from the past 240 hour(s))  Respiratory Panel by PCR     Status: None   Collection Time:  04/06/17  5:32 AM  Result Value Ref Range Status   Adenovirus NOT DETECTED NOT DETECTED Final   Coronavirus 229E NOT DETECTED NOT DETECTED Final   Coronavirus HKU1 NOT DETECTED NOT DETECTED Final   Coronavirus NL63 NOT DETECTED NOT DETECTED Final   Coronavirus OC43 NOT DETECTED NOT DETECTED Final   Metapneumovirus NOT DETECTED NOT DETECTED Final   Rhinovirus / Enterovirus NOT DETECTED NOT DETECTED Final   Influenza A NOT DETECTED NOT DETECTED Final   Influenza B NOT DETECTED NOT DETECTED Final   Parainfluenza Virus 1 NOT DETECTED NOT DETECTED Final   Parainfluenza Virus 2 NOT DETECTED NOT DETECTED Final   Parainfluenza Virus 3 NOT DETECTED NOT DETECTED Final   Parainfluenza Virus 4 NOT DETECTED  NOT DETECTED Final   Respiratory Syncytial Virus NOT DETECTED NOT DETECTED Final   Bordetella pertussis NOT DETECTED NOT DETECTED Final   Chlamydophila pneumoniae NOT DETECTED NOT DETECTED Final   Mycoplasma pneumoniae NOT DETECTED NOT DETECTED Final       Radiology Studies: No results found.    Scheduled Meds: . apixaban  5 mg Oral BID  . atorvastatin  40 mg Oral q1800  . doxycycline  100 mg Oral Q12H  . fluticasone  2 spray Each Nare BID  . gabapentin  300 mg Oral BID  . ipratropium-albuterol  3 mL Nebulization BID  . methylPREDNISolone (SOLU-MEDROL) injection  60 mg Intravenous Daily  . mometasone-formoterol  2 puff Inhalation BID  . pantoprazole  40 mg Oral Daily   Continuous Infusions:    LOS: 4 days    Time spent: 20 minutes   Reyne Dumas, DO [facility] [email or web address] Password [facility] 04/10/2017, 12:14 PM

## 2017-04-10 NOTE — Progress Notes (Signed)
Pt's SpO2= 98% on room air while ambulating.

## 2017-04-10 NOTE — Consult Note (Signed)
   [facility] CM Inpatient Consult   04/10/2017  Finas Delone 07-27-1960 [id number]  Patient screened for potential [place] Management services for high risk for re-admission screen.  Patient admitted with exacerbation of COPD. Patient is in the [facility] [place] [facility] services under patient's Medicare/ACO plan.  Met with the patient at the bedside. Explained [facility] Care Management program for in network post hospital follow up.  Patient states, "I don't need any home visits, I get my medicines from [facility] on [address], I have no transportation problems getting to the doctor, I think I'm good."  Patient did agree to telephonic follow up.  Will assign patient to COPD EMMI follow up calls. Please place a [facility] Care Management consult or for questions contact:   Natividad Brood, RN BSN [facility] Liaison  [phone] business mobile phone Toll free office [phone]

## 2017-04-11 DIAGNOSIS — B182 Chronic viral hepatitis C: Secondary | ICD-10-CM

## 2017-04-11 MED ORDER — TRAMADOL HCL 50 MG PO TABS: 50.0000 mg | ORAL_TABLET | Freq: Four times a day (QID) | ORAL | 0 refills | Status: DC | PRN

## 2017-04-11 NOTE — Progress Notes (Signed)
Pt discharge instructions reviewed with pt. Pt verbalizes understanding. Pt belongings with pt. Pt is not in distress. Pt is waiting for friend to arrive to drive him home.

## 2017-04-13 ENCOUNTER — Telehealth: Payer: Self-pay

## 2017-04-13 NOTE — Telephone Encounter (Signed)
[facility] Post-discharge Follow-Up Phone Call:  Date of Discharge: 04/10/2017 Principal Discharge Diagnosis(es):  COPD exaerbation, history of pulmonary embolism Post-discharge Communication: (Clearly document all attempts clearly and date contact made)call placed to the patient Call Completed: Yes                  With Whom: patient Interpreter Needed: No     Please check all that apply:  X Patient is knowledgeable of his/her condition(s) and/or treatment. - He stated that he is " feeling fine."  ? Patient is caring for self at home.  X  Patient is receiving assist at home from family and/or caregiver. Family and/or caregiver is knowledgeable of patient's condition(s) and/or treatment. - he stated that he has help from his house mate when needed  ? Patient is receiving home health services. If so, name of agency. - he stated that he was offered a home health nurse at discharge but refused. He did state that he is willing to consider it for the future.      Medication Reconciliation:  X  Medication list reviewed with patient. - He did not want to review the medication list.  He stated " I already got it."  X  Patient obtained all discharge medications. If not, why?  He stated that he has not picked up any new  medications but has " enough to get by."  He noted that he does not have the " funds" to get medication right now, but he will this weekend.  No had no questions at this time about his medications.    Activities of Daily Living:  X  Independent - no assistive devices needed.  He did confirm that he still has his nebulizer.  ? Needs assist (describe; ? home DME used) ? [employer] (describe, ? home DME used)   Community resources in place for patient:  X  He has DSS transportation. Has disability and food stamps.  ? Home Health/Home DME ? Assisted Living ? Support Group        Questions/Concerns discussed: He confirmed his appointment with Dr Jarold Song for 04/19/16  @ 0945 and said that he has already arranged transportation. No questions/concerns reported at this time.   Message sent to Dr Jarold Song regarding the status of medications.

## 2017-04-19 ENCOUNTER — Inpatient Hospital Stay: Payer: Medicare Other | Admitting: Family Medicine

## 2017-04-24 ENCOUNTER — Encounter: Payer: Self-pay | Admitting: Family Medicine

## 2017-04-24 ENCOUNTER — Ambulatory Visit: Payer: Medicare Other | Attending: Family Medicine | Admitting: Family Medicine

## 2017-04-24 VITALS — BP 136/88 | HR 77 | Temp 98.0°F | Ht 74.0 in | Wt 198.0 lb

## 2017-04-24 DIAGNOSIS — Z86711 Personal history of pulmonary embolism: Secondary | ICD-10-CM | POA: Diagnosis not present

## 2017-04-24 DIAGNOSIS — Z955 Presence of coronary angioplasty implant and graft: Secondary | ICD-10-CM | POA: Insufficient documentation

## 2017-04-24 DIAGNOSIS — Z8619 Personal history of other infectious and parasitic diseases: Secondary | ICD-10-CM | POA: Insufficient documentation

## 2017-04-24 DIAGNOSIS — Z85828 Personal history of other malignant neoplasm of skin: Secondary | ICD-10-CM | POA: Diagnosis not present

## 2017-04-24 DIAGNOSIS — Z7901 Long term (current) use of anticoagulants: Secondary | ICD-10-CM | POA: Insufficient documentation

## 2017-04-24 DIAGNOSIS — I5032 Chronic diastolic (congestive) heart failure: Secondary | ICD-10-CM | POA: Insufficient documentation

## 2017-04-24 DIAGNOSIS — M19079 Primary osteoarthritis, unspecified ankle and foot: Secondary | ICD-10-CM | POA: Insufficient documentation

## 2017-04-24 DIAGNOSIS — J449 Chronic obstructive pulmonary disease, unspecified: Secondary | ICD-10-CM | POA: Diagnosis present

## 2017-04-24 DIAGNOSIS — E7800 Pure hypercholesterolemia, unspecified: Secondary | ICD-10-CM | POA: Insufficient documentation

## 2017-04-24 DIAGNOSIS — Z8701 Personal history of pneumonia (recurrent): Secondary | ICD-10-CM | POA: Diagnosis not present

## 2017-04-24 DIAGNOSIS — K219 Gastro-esophageal reflux disease without esophagitis: Secondary | ICD-10-CM | POA: Diagnosis not present

## 2017-04-24 DIAGNOSIS — G629 Polyneuropathy, unspecified: Secondary | ICD-10-CM | POA: Diagnosis not present

## 2017-04-24 DIAGNOSIS — Z7951 Long term (current) use of inhaled steroids: Secondary | ICD-10-CM | POA: Diagnosis not present

## 2017-04-24 DIAGNOSIS — I110 Hypertensive heart disease with heart failure: Secondary | ICD-10-CM | POA: Insufficient documentation

## 2017-04-24 DIAGNOSIS — T380X5A Adverse effect of glucocorticoids and synthetic analogues, initial encounter: Secondary | ICD-10-CM | POA: Diagnosis not present

## 2017-04-24 DIAGNOSIS — Z79899 Other long term (current) drug therapy: Secondary | ICD-10-CM | POA: Diagnosis not present

## 2017-04-24 DIAGNOSIS — J438 Other emphysema: Secondary | ICD-10-CM | POA: Diagnosis not present

## 2017-04-24 DIAGNOSIS — I25119 Atherosclerotic heart disease of native coronary artery with unspecified angina pectoris: Secondary | ICD-10-CM

## 2017-04-24 DIAGNOSIS — I252 Old myocardial infarction: Secondary | ICD-10-CM | POA: Diagnosis not present

## 2017-04-24 DIAGNOSIS — Z86718 Personal history of other venous thrombosis and embolism: Secondary | ICD-10-CM | POA: Insufficient documentation

## 2017-04-24 DIAGNOSIS — G720 Drug-induced myopathy: Secondary | ICD-10-CM | POA: Diagnosis not present

## 2017-04-24 DIAGNOSIS — M19049 Primary osteoarthritis, unspecified hand: Secondary | ICD-10-CM | POA: Insufficient documentation

## 2017-04-24 MED ORDER — FLUTICASONE-SALMETEROL 250-50 MCG/DOSE IN AEPB: 1.0000 | INHALATION_SPRAY | Freq: Two times a day (BID) | RESPIRATORY_TRACT | 3 refills | Status: DC

## 2017-04-24 MED ORDER — GABAPENTIN 300 MG PO CAPS: 300.0000 mg | ORAL_CAPSULE | Freq: Two times a day (BID) | ORAL | 3 refills | Status: DC

## 2017-04-24 MED ORDER — PANTOPRAZOLE SODIUM 40 MG PO TBEC: 40.0000 mg | DELAYED_RELEASE_TABLET | Freq: Every day | ORAL | 3 refills | Status: DC

## 2017-04-24 MED ORDER — APIXABAN 5 MG PO TABS: 5.0000 mg | ORAL_TABLET | Freq: Two times a day (BID) | ORAL | 3 refills | Status: DC

## 2017-04-24 MED ORDER — VENTOLIN HFA 108 (90 BASE) MCG/ACT IN AERS: 2.0000 | INHALATION_SPRAY | Freq: Four times a day (QID) | RESPIRATORY_TRACT | 1 refills | Status: DC | PRN

## 2017-04-24 MED ORDER — ATORVASTATIN CALCIUM 40 MG PO TABS: 40.0000 mg | ORAL_TABLET | Freq: Every day | ORAL | 3 refills | Status: DC

## 2017-04-24 NOTE — Patient Instructions (Signed)
Myopathy Myopathy is a condition that causes muscles to be weak and dysfunctional. There are many different kinds of myopathies. Myopathies may be passed from parent to child (inherited), or they may be caused by external factors (acquired). Inherited myopathies may cause symptoms at birth or in early life. Acquired myopathies may start suddenly at any age. There is no cure for most myopathies. What are the causes? Causes of myopathy include:  Endocrine disorders, such as thyroid disease.  Metabolic disorders, which are usually inherited.  Infection or inflammation of the muscles. This is often triggered by viruses or because the body's defense system (immune system) is attacking the muscles.  Certain medicines, such as lipid-lowering medicines.  In some cases, the cause is not known. What increases the risk? This condition is more likely to develop in:  People who have a family history of myopathy.  Women.  What are the signs or symptoms? Symptoms of myopathy can range from mild to severe. The most common symptoms include muscle weakness, cramps, stiffness, and spasms. These symptoms are usually felt close to the center of the body (proximal). Depending upon the type of myopathy, one muscle group may be more affected than others. In inherited myopathies, symptoms vary among family members. Other symptoms of myopathy include:  Muscle pain or tenderness.  Muscle weakness that gets progressively worse.  Fatigue.  Heart problems.  Trouble breathing.  Trouble swallowing.  How is this diagnosed? This condition is diagnosed based on your medical history and tests, which may include:  Blood tests.  Removal of a small piece of muscle tissue to be tested (biopsy).  Electromyogram (EMG).  MRI.  Electrocardiogram (ECG).  Genetic testing.  How is this treated? Treatment varies depending on the type of myopathy. Treatment may include over-the-counter or prescription  medicines, such as disease-modifying antirheumatic drugs (DMARDs) or drugs that suppress the immune system. You may need physical therapy, a brace to stabilize your muscles, or surgery. Follow these instructions at home: If you have a brace:  Wear the brace as told by your health care provider. Remove it only as told by your health care provider.  Loosen the brace if any part of your body tingles, becomes numb, or turns cold and blue.  Do not let your brace get wet if it is not waterproof. If it is not waterproof, cover it with a watertight plastic bag when you take a bath or a shower.  Keep the brace clean.  Ask your health care provider when it is safe to drive if you are wearing a brace. General instructions  Take over-the-counter and prescription medicines only as told by your health care provider.  Maintain a healthy weight. Follow instructions from your health care provider about eating, drinking, and physical activity.  If physical therapy is prescribed, do exercises as told by your health care provider or physical therapist.  Keep all follow-up visits as told by your health care provider. This is important. Contact a health care provider if:  You have trouble managing your symptoms at home.  You have a fever. Get help right away if:  You develop breathing problems.  You develop chest pain. This information is not intended to replace advice given to you by your health care provider. Make sure you discuss any questions you have with your health care provider. Document Released: 03/03/2002 Document Revised: 08/19/2015 Document Reviewed: 09/23/2014 [employer] Interactive Patient Education  2018 [employer]  

## 2017-04-24 NOTE — Progress Notes (Signed)
[facility]itional [facility]  Subjective:  Patient ID: [name], male    DOB: [birth year]  Age: 57 y.o. MRN: [id number]  CC: COPD   HPI [name]  is a 57 year old male with a history of COPD, chronic sinusitis, recurrent DVT and PE (on anticoagulation with Eliquis), coronary artery disease (status post stenting, last cardiac cath from 12/2014 revealed two-vessel CAD, medical management recommended), tobacco abuse who presents today at the transitional care clinic after hospitalization at [facility] from 04/05/17 through 04/10/17 for COPD exacerbation.  He had presented to the ED with worsening dyspnea after an upper respiratory infection which was not relieved by the use of his bronchodilators at home. On presentation he was wheezing, chest x-ray revealed no active disease and he was treated for acute respiratory failure with hypoxia secondary to COPD exacerbation and commenced on Solu-Medrol and nebulizer treatments and antibiotics; RSV panel was negative. His condition improved and he was subsequently discharged on a prednisone taper.  He presents today reporting resolution of his respiratory symptoms and has been compliant with his medications however he complains of weakness in his thighs on an attempt to stand from a sitting position but denies weakness of his upper extremities. He is able to walk and denies any falls. Denies fever, chest pain.  Past Medical History:  Diagnosis Date  . Asthma   . CAD (coronary artery disease)    a. 09/2013 NSTEMI/PCI: LM nl, LAD 40-50%, D1 100 (2.25 x 28 Vision BMS), LCX min irregs, RI 60-70, 30, RCA 40-50/50-6ms/p, EF 45-50%.  b. cath 12/2014 -occulded BMS in diag, 50% pro to mid LAD, 60% ramus, 30% RCA   . Chest pain 08/2015  . Chronic diastolic CHF (congestive heart failure) ([place])    a. 09/2014 EF 45-50% by LV gram;  b. 01/2014 Echo: EF 55-60%, Gr 1 DD.  Marland Kitchen Cocaine abuse ([place])   . COPD (chronic obstructive pulmonary disease) ([place])     . DVT (deep venous thrombosis) ([place])    "I had ~ 10 in each leg" (12/20/2016)  . Essential hypertension   . Hepatitis C    "clear free over a year now"  . High cholesterol   . Myocardial infarction ([place]) ~ 2014  . Osteoarthritis    a. hands and toes.  . Pneumonia    "several times" (12/20/2016)  . Pulmonary embolism ([place])    a. 2012 - s/p IVC filter;  b. prev on eliquis - noncompliant.  . Shortness of breath dyspnea   . Sinus headache   . Squamous cell skin cancer 07/13/2016   "foot"  . Tobacco abuse    Past Surgical History:  Procedure Laterality Date  . CARDIAC CATHETERIZATION N/A 01/04/2015   Procedure: Left Heart Cath and Coronary Angiography;  Surgeon: Burnell Blanks, MD;  Location: [facility];  Service: Cardiovascular;  Laterality: N/A;  . CORONARY ANGIOPLASTY WITH STENT PLACEMENT  10/21/13   BMS to D1  . CYST EXCISION N/A 05/10/2016   Procedure: EXCISION OF SEBACEOUS CYST UPPER BACK;  Surgeon: Donnie Mesa, MD;  Location: [facility];  Service: General;  Laterality: N/A;  . FOOT SURGERY Right ~ 06/2016   "said there was skin cancer on it"  . LEFT HEART CATHETERIZATION WITH CORONARY ANGIOGRAM N/A 10/21/2013   Procedure: LEFT HEART CATHETERIZATION WITH CORONARY ANGIOGRAM;  Surgeon: Leonie Man, MD;  Location: [facility] CATH LAB;  Service: Cardiovascular;  Laterality: N/A;  . VENA CAVA FILTER PLACEMENT  ~ 2007-~ 2017   "1  in my neck; 2 in my wrist"    Allergies  Allergen Reactions  . No Known Allergies      Outpatient Medications Prior to Visit  Medication Sig Dispense Refill  . acetaminophen (TYLENOL) 325 MG tablet Take 650 mg by mouth every 6 (six) hours as needed for mild pain.    Marland Kitchen albuterol (PROVENTIL) (2.5 MG/3ML) 0.083% nebulizer solution Take 3 mLs (2.5 mg total) every 6 (six) hours as needed by nebulization for wheezing or shortness of breath. 75 mL 3  . diclofenac sodium (VOLTAREN) 1 % GEL Apply 4 g 4 (four) times daily topically. 100 g 1  . fluticasone  (FLONASE) 50 MCG/ACT nasal spray Place 2 sprays into both nostrils 2 (two) times daily.  2  . predniSONE (DELTASONE) 10 MG tablet 6 tablets for 3 days, 5 tablets for 3 days, 4 tablets for 3 days, 3 tablets for 3 days, 2 tablets for 3 days, 1 tablet for 3 days then discontinue 120 tablet 0  . traMADol (ULTRAM) 50 MG tablet Take 1 tablet (50 mg total) by mouth every 6 (six) hours as needed. 20 tablet 0  . apixaban (ELIQUIS) 5 MG TABS tablet Take 1 tablet (5 mg total) 2 (two) times daily by mouth. 60 tablet 3  . atorvastatin (LIPITOR) 40 MG tablet Take 1 tablet (40 mg total) daily at 6 PM by mouth. 30 tablet 3  . Fluticasone-Salmeterol (ADVAIR DISKUS) 250-50 MCG/DOSE AEPB Inhale 1 puff 2 (two) times daily into the lungs. 1 each 3  . pantoprazole (PROTONIX) 40 MG tablet Take 40 mg by mouth daily.   3  . VENTOLIN HFA 108 (90 Base) MCG/ACT inhaler Inhale 2 puffs every 6 (six) hours as needed into the lungs for wheezing or shortness of breath. 1 Inhaler 1  . tiZANidine (ZANAFLEX) 4 MG tablet Take 1 tablet (4 mg total) every 8 (eight) hours as needed by mouth for muscle spasms. (Patient not taking: Reported on 04/05/2017) 60 tablet 1  . dextromethorphan-guaiFENesin (MUCINEX DM) 30-600 MG 12hr tablet Take 1 tablet by mouth 2 (two) times daily as needed for cough. (Patient not taking: Reported on 04/24/2017) 20 tablet 0  . gabapentin (NEURONTIN) 300 MG capsule Take 1 capsule (300 mg total) 2 (two) times daily by mouth. (Patient not taking: Reported on 04/24/2017) 60 capsule 3   No facility-administered medications prior to visit.     ROS Review of Systems  Constitutional: Negative for activity change and appetite change.  HENT: Negative for sinus pressure and sore throat.   Eyes: Negative for visual disturbance.  Respiratory: Negative for cough, chest tightness and shortness of breath.   Cardiovascular: Negative for chest pain and leg swelling.  Gastrointestinal: Negative for abdominal distention, abdominal  pain, constipation and diarrhea.  Endocrine: Negative.   Genitourinary: Negative for dysuria.  Musculoskeletal: Negative for joint swelling and myalgias.  Skin: Negative for rash.  Allergic/Immunologic: Negative.   Neurological: Negative for weakness, light-headedness and numbness.  Psychiatric/Behavioral: Negative for dysphoric mood and suicidal ideas.    Objective:  BP 136/88   Pulse 77   Temp 98 F (36.7 C) (Oral)   Ht 6\' 2"  (1.88 m)   Wt 198 lb (89.8 kg)   SpO2 100%   BMI 25.42 kg/m   BP/Weight 04/24/2017 04/11/2017 40/11/8117  Systolic BP 147 829 562  Diastolic BP 88 87 84  Wt. (Lbs) 198 203.9 194.4  BMI 25.42 26.18 24.96      Physical Exam  Constitutional: He is oriented to  person, place, and time. He appears well-developed and well-nourished.  Cardiovascular: Normal rate, normal heart sounds and intact distal pulses.  No murmur heard. Pulmonary/Chest: Effort normal and breath sounds normal. He has no wheezes. He has no rales. He exhibits no tenderness.  Abdominal: Soft. Bowel sounds are normal. He exhibits no distension and no mass. There is no tenderness.  Musculoskeletal: Normal range of motion.  Neurological: He is alert and oriented to person, place, and time.  Skin: Skin is warm and dry.  Psychiatric: He has a normal mood and affect.      Assessment & Plan:   1. Other emphysema (HCC) Controlled - Fluticasone-Salmeterol (ADVAIR DISKUS) 250-50 MCG/DOSE AEPB; Inhale 1 puff into the lungs 2 (two) times daily.  Dispense: 1 each; Refill: 3 - VENTOLIN HFA 108 (90 Base) MCG/ACT inhaler; Inhale 2 puffs into the lungs every 6 (six) hours as needed for wheezing or shortness of breath.  Dispense: 1 Inhaler; Refill: 1  2. Neuropathy Stable - gabapentin (NEURONTIN) 300 MG capsule; Take 1 capsule (300 mg total) by mouth 2 (two) times daily.  Dispense: 60 capsule; Refill: 3  3. Coronary artery disease involving native coronary artery of native heart with angina  pectoris (HCC) Status post stenting Asymptomatic at this time Risk factor modification - atorvastatin (LIPITOR) 40 MG tablet; Take 1 tablet (40 mg total) by mouth daily at 6 PM.  Dispense: 30 tablet; Refill: 3  4. History of pulmonary embolism Will remain on lifelong anticoagulation due to recurrent thrombotic events - apixaban (ELIQUIS) 5 MG TABS tablet; Take 1 tablet (5 mg total) by mouth 2 (two) times daily.  Dispense: 60 tablet; Refill: 3  5. Steroid-induced myopathy He was discharged on high dose of prednisone taper Advised that condition is self-limiting He declines physical therapy referral at this time  6. Gastroesophageal reflux disease without esophagitis Controlled - pantoprazole (PROTONIX) 40 MG tablet; Take 1 tablet (40 mg total) by mouth daily.  Dispense: 30 tablet; Refill: 3   Meds ordered this encounter  Medications  . pantoprazole (PROTONIX) 40 MG tablet    Sig: Take 1 tablet (40 mg total) by mouth daily.    Dispense:  30 tablet    Refill:  3  . Fluticasone-Salmeterol (ADVAIR DISKUS) 250-50 MCG/DOSE AEPB    Sig: Inhale 1 puff into the lungs 2 (two) times daily.    Dispense:  1 each    Refill:  3  . gabapentin (NEURONTIN) 300 MG capsule    Sig: Take 1 capsule (300 mg total) by mouth 2 (two) times daily.    Dispense:  60 capsule    Refill:  3  . atorvastatin (LIPITOR) 40 MG tablet    Sig: Take 1 tablet (40 mg total) by mouth daily at 6 PM.    Dispense:  30 tablet    Refill:  3  . apixaban (ELIQUIS) 5 MG TABS tablet    Sig: Take 1 tablet (5 mg total) by mouth 2 (two) times daily.    Dispense:  60 tablet    Refill:  3  . VENTOLIN HFA 108 (90 Base) MCG/ACT inhaler    Sig: Inhale 2 puffs into the lungs every 6 (six) hours as needed for wheezing or shortness of breath.    Dispense:  1 Inhaler    Refill:  1    Follow-up: Return in about 3 months (around 07/23/2017) for Follow-up of chronic medical conditions.   Charlott Rakes MD

## 2017-04-27 ENCOUNTER — Other Ambulatory Visit: Payer: Self-pay

## 2017-04-27 NOTE — Patient Outreach (Signed)
[facility]) Care Management  04/27/2017  Columbus Ice [birth year] [id number]     EMMI-COPD RED ON EMMI ALERT Day # 14 Date: 04/26/17 Red Alert Reason: " Number of times rescue inhaler used in past 24 hrs? 3"   Outreach attempt # 1 to patient. No answer at present. RN CM left HIPAA compliant voicemail message along with contact info.         Plan: RN CM will make outreach attempt to patient within one business day if no return call from patient.  Enzo Montgomery, RN,BSN,CCM [place] Management Telephonic Care Management Coordinator Direct Phone: [phone] Toll Free: [phone] Fax: [phone]

## 2017-04-30 ENCOUNTER — Other Ambulatory Visit: Payer: Self-pay

## 2017-04-30 NOTE — Patient Outreach (Signed)
[facility]) Care Management  04/30/2017  [name] 1961/01/05 [id number]   EMMI-COPD RED ON EMMI ALERT Day #14 Date:04/26/17 Red Alert Reason:" Number of times rescue inhaler used in past 24 hrs? 3"    Voicemail message received from patient. Return call placed to patient. RN CM reviewed and addressed red alert. Patient does not recall responding that way. He voices that his breathing has been just fine since discharge from the hospital. He reports that he has inhaler and uses it prn. Patient states that there has been several days when he has not had touse inhaler at all. He voices that he is aware if he starts having to use inhalers too often that he is probably developing respiratory issues and needs to seek medical care. He also states that he is knowledgeable regarding his condition as COPD and asthma runs in his family. He has completed MD f/u appts. Denies any issues with meds. He voices no further RN CM needs or concerns at this time and was appreciative of follow up call.      Plan: RN CM will notify [facility] administrative assistant of case status.   Enzo Montgomery, RN,BSN,CCM [place] Management Telephonic Care Management Coordinator Direct Phone: [phone] Toll Free: [phone] Fax: [phone]

## 2017-04-30 NOTE — Patient Outreach (Signed)
[facility]) Care Management  04/30/2017  Yafet Cline 09-14-1960 [id number]    EMMI-COPD RED ON EMMI ALERT Day # 14 Date: 04/26/17 Red Alert Reason: " Number of times rescue inhaler used in past 24 hrs? 3"    Outreach attempt #2 to patient. No answer at present and unable to leave message.      Plan: RN CM will make outreach attempt to patient within one business day if no return call from patient.    Enzo Montgomery, RN,BSN,CCM [place] Management Telephonic Care Management Coordinator Direct Phone: [phone] Toll Free: [phone] Fax: [phone]

## 2017-05-07 ENCOUNTER — Ambulatory Visit: Payer: Medicare Other | Admitting: Family Medicine

## 2017-05-15 ENCOUNTER — Encounter: Payer: Self-pay | Admitting: Family Medicine

## 2017-05-15 ENCOUNTER — Ambulatory Visit: Payer: Medicare Other | Attending: Family Medicine | Admitting: Family Medicine

## 2017-05-15 ENCOUNTER — Other Ambulatory Visit: Payer: Self-pay | Admitting: Family Medicine

## 2017-05-15 VITALS — BP 155/99 | HR 64 | Temp 98.5°F | Resp 16 | Wt 198.0 lb

## 2017-05-15 DIAGNOSIS — J449 Chronic obstructive pulmonary disease, unspecified: Secondary | ICD-10-CM

## 2017-05-15 DIAGNOSIS — Z79899 Other long term (current) drug therapy: Secondary | ICD-10-CM | POA: Insufficient documentation

## 2017-05-15 DIAGNOSIS — R05 Cough: Secondary | ICD-10-CM | POA: Diagnosis not present

## 2017-05-15 DIAGNOSIS — I252 Old myocardial infarction: Secondary | ICD-10-CM | POA: Diagnosis not present

## 2017-05-15 DIAGNOSIS — I110 Hypertensive heart disease with heart failure: Secondary | ICD-10-CM | POA: Diagnosis not present

## 2017-05-15 DIAGNOSIS — I5032 Chronic diastolic (congestive) heart failure: Secondary | ICD-10-CM | POA: Insufficient documentation

## 2017-05-15 DIAGNOSIS — M546 Pain in thoracic spine: Secondary | ICD-10-CM

## 2017-05-15 DIAGNOSIS — G729 Myopathy, unspecified: Secondary | ICD-10-CM | POA: Diagnosis not present

## 2017-05-15 DIAGNOSIS — I25119 Atherosclerotic heart disease of native coronary artery with unspecified angina pectoris: Secondary | ICD-10-CM

## 2017-05-15 DIAGNOSIS — Z86711 Personal history of pulmonary embolism: Secondary | ICD-10-CM | POA: Insufficient documentation

## 2017-05-15 DIAGNOSIS — Z7901 Long term (current) use of anticoagulants: Secondary | ICD-10-CM | POA: Diagnosis not present

## 2017-05-15 DIAGNOSIS — I2510 Atherosclerotic heart disease of native coronary artery without angina pectoris: Secondary | ICD-10-CM | POA: Diagnosis not present

## 2017-05-15 DIAGNOSIS — Z86718 Personal history of other venous thrombosis and embolism: Secondary | ICD-10-CM | POA: Diagnosis not present

## 2017-05-15 DIAGNOSIS — R059 Cough, unspecified: Secondary | ICD-10-CM

## 2017-05-15 MED ORDER — TIZANIDINE HCL 4 MG PO TABS: 4.0000 mg | ORAL_TABLET | Freq: Three times a day (TID) | ORAL | 1 refills | Status: DC | PRN

## 2017-05-15 MED ORDER — BENZONATATE 100 MG PO CAPS: 100.0000 mg | ORAL_CAPSULE | Freq: Two times a day (BID) | ORAL | 0 refills | Status: DC | PRN

## 2017-05-15 MED ORDER — TRAMADOL HCL 50 MG PO TABS: 50.0000 mg | ORAL_TABLET | Freq: Two times a day (BID) | ORAL | 0 refills | Status: DC | PRN

## 2017-05-15 NOTE — Patient Instructions (Signed)
[facility]  ph# [phone]; Address [address].

## 2017-05-15 NOTE — Progress Notes (Signed)
Subjective:  Patient ID: [name], male    DOB: [birth year]  Age: 57 y.o. MRN: [id number]  CC: Follow-up (3 month)   HPI [name]  is a 57 year old male with a history of COPD, chronic sinusitis, recurrent DVT and PE (on anticoagulation with Eliquis), coronary artery disease (status post stenting, last cardiac cath from 12/2014 revealed two-vessel CAD, medical management recommended), tobacco abuse who presents today for follow-up visit. He had a hospitalization for COPD exacerbation last month.  He continues to complain of difficulty rising up from a seated position without using his hands to push off and this becomes more difficult to close he is to the ground has had to crawl to a position where he finds support to lift himself up and once he gets up he is able to walk around with no trouble ambulating.   He has no weakness in his upper extremities. He was discharged on a prednisone taper which he is still taking and was thought to have steroid-induced myopathy at his last office visit. He does have a cough which is intermittent but then notices his right thoracolumbar region hurts whenever he coughs or whenever he twists.  He denies history of trauma.  He is requesting the number to the dermatologist who was referred to last year lesions in his back. Past Medical History:  Diagnosis Date  . Asthma   . CAD (coronary artery disease)    a. 09/2013 NSTEMI/PCI: LM nl, LAD 40-50%, D1 100 (2.25 x 28 Vision BMS), LCX min irregs, RI 60-70, 30, RCA 40-50/50-27ms/p, EF 45-50%.  b. cath 12/2014 -occulded BMS in diag, 50% pro to mid LAD, 60% ramus, 30% RCA   . Chest pain 08/2015  . Chronic diastolic CHF (congestive heart failure) ([place])    a. 09/2014 EF 45-50% by LV gram;  b. 01/2014 Echo: EF 55-60%, Gr 1 DD.  Marland Kitchen Cocaine abuse ([place])   . COPD (chronic obstructive pulmonary disease) ([place])   . DVT (deep venous thrombosis) ([place])    "I had ~ 10 in each leg" (12/20/2016)  . Essential  hypertension   . Hepatitis C    "clear free over a year now"  . High cholesterol   . Myocardial infarction ([place]) ~ 2014  . Osteoarthritis    a. hands and toes.  . Pneumonia    "several times" (12/20/2016)  . Pulmonary embolism ([place])    a. 2012 - s/p IVC filter;  b. prev on eliquis - noncompliant.  . Shortness of breath dyspnea   . Sinus headache   . Squamous cell skin cancer 07/13/2016   "foot"  . Tobacco abuse     Past Surgical History:  Procedure Laterality Date  . CARDIAC CATHETERIZATION N/A 01/04/2015   Procedure: Left Heart Cath and Coronary Angiography;  Surgeon: Burnell Blanks, MD;  Location: [facility];  Service: Cardiovascular;  Laterality: N/A;  . CORONARY ANGIOPLASTY WITH STENT PLACEMENT  10/21/13   BMS to D1  . CYST EXCISION N/A 05/10/2016   Procedure: EXCISION OF SEBACEOUS CYST UPPER BACK;  Surgeon: Donnie Mesa, MD;  Location: [facility];  Service: General;  Laterality: N/A;  . FOOT SURGERY Right ~ 06/2016   "said there was skin cancer on it"  . LEFT HEART CATHETERIZATION WITH CORONARY ANGIOGRAM N/A 10/21/2013   Procedure: LEFT HEART CATHETERIZATION WITH CORONARY ANGIOGRAM;  Surgeon: Leonie Man, MD;  Location: [facility] CATH LAB;  Service: Cardiovascular;  Laterality: N/A;  . VENA CAVA FILTER PLACEMENT  ~ 2007-~  2017   "1 in my neck; 2 in my wrist"    Allergies  Allergen Reactions  . No Known Allergies      Outpatient Medications Prior to Visit  Medication Sig Dispense Refill  . acetaminophen (TYLENOL) 325 MG tablet Take 650 mg by mouth every 6 (six) hours as needed for mild pain.    Marland Kitchen albuterol (PROVENTIL) (2.5 MG/3ML) 0.083% nebulizer solution Take 3 mLs (2.5 mg total) every 6 (six) hours as needed by nebulization for wheezing or shortness of breath. 75 mL 3  . apixaban (ELIQUIS) 5 MG TABS tablet Take 1 tablet (5 mg total) by mouth 2 (two) times daily. 60 tablet 3  . atorvastatin (LIPITOR) 40 MG tablet Take 1 tablet (40 mg total) by mouth daily at 6  PM. 30 tablet 3  . diclofenac sodium (VOLTAREN) 1 % GEL Apply 4 g 4 (four) times daily topically. 100 g 1  . fluticasone (FLONASE) 50 MCG/ACT nasal spray Place 2 sprays into both nostrils 2 (two) times daily.  2  . Fluticasone-Salmeterol (ADVAIR DISKUS) 250-50 MCG/DOSE AEPB Inhale 1 puff into the lungs 2 (two) times daily. 1 each 3  . gabapentin (NEURONTIN) 300 MG capsule Take 1 capsule (300 mg total) by mouth 2 (two) times daily. 60 capsule 3  . pantoprazole (PROTONIX) 40 MG tablet Take 1 tablet (40 mg total) by mouth daily. 30 tablet 3  . VENTOLIN HFA 108 (90 Base) MCG/ACT inhaler Inhale 2 puffs into the lungs every 6 (six) hours as needed for wheezing or shortness of breath. 1 Inhaler 1  . traMADol (ULTRAM) 50 MG tablet Take 1 tablet (50 mg total) by mouth every 6 (six) hours as needed. 20 tablet 0  . predniSONE (DELTASONE) 10 MG tablet 6 tablets for 3 days, 5 tablets for 3 days, 4 tablets for 3 days, 3 tablets for 3 days, 2 tablets for 3 days, 1 tablet for 3 days then discontinue (Patient not taking: Reported on 05/15/2017) 120 tablet 0  . tiZANidine (ZANAFLEX) 4 MG tablet Take 1 tablet (4 mg total) every 8 (eight) hours as needed by mouth for muscle spasms. (Patient not taking: Reported on 04/05/2017) 60 tablet 1   No facility-administered medications prior to visit.     ROS Review of Systems  Constitutional: Negative for activity change and appetite change.  HENT: Negative for sinus pressure and sore throat.   Eyes: Negative for visual disturbance.  Respiratory: Positive for cough. Negative for chest tightness and shortness of breath.   Cardiovascular: Negative for chest pain and leg swelling.  Gastrointestinal: Negative for abdominal distention, abdominal pain, constipation and diarrhea.  Endocrine: Negative.   Genitourinary: Negative for dysuria.  Musculoskeletal: Negative for joint swelling and myalgias.  Skin: Negative for rash.  Allergic/Immunologic: Negative.   Neurological:  Negative for weakness, light-headedness and numbness.  Psychiatric/Behavioral: Negative for dysphoric mood and suicidal ideas.    Objective:  BP (!) 155/99   Pulse 64   Temp 98.5 F (36.9 C) (Oral)   Resp 16   Wt 198 lb (89.8 kg)   SpO2 99%   BMI 25.42 kg/m   BP/Weight 05/15/2017 04/24/2017 3/81/8299  Systolic BP 371 696 789  Diastolic BP 99 88 87  Wt. (Lbs) 198 198 203.9  BMI 25.42 25.42 26.18      Physical Exam  Constitutional: He is oriented to person, place, and time. He appears well-developed and well-nourished.  Cardiovascular: Normal rate, normal heart sounds and intact distal pulses.  No murmur heard.  Pulmonary/Chest: Effort normal and breath sounds normal. He has no wheezes. He has no rales. He exhibits no tenderness.  Abdominal: Soft. Bowel sounds are normal. He exhibits no distension and no mass. There is no tenderness.  Musculoskeletal: Normal range of motion. He exhibits tenderness (TTP of right thoracolumbar muscle).  Neurological: He is alert and oriented to person, place, and time.  Normal motor strength in all extremities  Skin: Skin is warm and dry.  Psychiatric: He has a normal mood and affect.     Assessment & Plan:   1. Acute right-sided thoracic back pain Likely muscle spasm Refilled muscle relaxant which she will take along with tramadol - tiZANidine (ZANAFLEX) 4 MG tablet; Take 1 tablet (4 mg total) by mouth every 8 (eight) hours as needed for muscle spasms.  Dispense: 60 tablet; Refill: 1 - traMADol (ULTRAM) 50 MG tablet; Take 1 tablet (50 mg total) by mouth every 12 (twelve) hours as needed.  Dispense: 30 tablet; Refill: 0  2. Cough - benzonatate (TESSALON) 100 MG capsule; Take 1 capsule (100 mg total) by mouth 2 (two) times daily as needed for cough.  Dispense: 20 capsule; Refill: 0   3. Myopathy Likely steroid-induced He is currently on the last few days of his prednisone taper If symptoms persist 1 month after discontinuation of  prednisone we will consider labs including CK, aldolase and possible referral to a specialist.  4. Chronic obstructive pulmonary disease, unspecified COPD type ([place]) On prednisone taper Continue inhalers Prescribed Tessalon Perles for cough   Meds ordered this encounter  Medications  . tiZANidine (ZANAFLEX) 4 MG tablet    Sig: Take 1 tablet (4 mg total) by mouth every 8 (eight) hours as needed for muscle spasms.    Dispense:  60 tablet    Refill:  1  . traMADol (ULTRAM) 50 MG tablet    Sig: Take 1 tablet (50 mg total) by mouth every 12 (twelve) hours as needed.    Dispense:  30 tablet    Refill:  0  . benzonatate (TESSALON) 100 MG capsule    Sig: Take 1 capsule (100 mg total) by mouth 2 (two) times daily as needed for cough.    Dispense:  20 capsule    Refill:  0    Follow-up: Return for follow up of chronic medical conditions keep previously scheduled appointment.   Charlott Rakes MD

## 2017-05-15 NOTE — Progress Notes (Signed)
Pt states every time he coughs his back hurts

## 2017-06-30 ENCOUNTER — Emergency Department (HOSPITAL_COMMUNITY)
Admission: EM | Admit: 2017-06-30 | Discharge: 2017-06-30 | Disposition: A | Discharge status: Home / Self Care | Payer: Medicare Other | Attending: Emergency Medicine | Admitting: Emergency Medicine

## 2017-06-30 ENCOUNTER — Other Ambulatory Visit: Payer: Self-pay

## 2017-06-30 ENCOUNTER — Emergency Department (HOSPITAL_BASED_OUTPATIENT_CLINIC_OR_DEPARTMENT_OTHER)
Admit: 2017-06-30 | Discharge: 2017-06-30 | Disposition: A | Discharge status: Home / Self Care | Payer: Medicare Other | Attending: Emergency Medicine | Admitting: Emergency Medicine

## 2017-06-30 ENCOUNTER — Encounter (HOSPITAL_COMMUNITY): Payer: Self-pay | Admitting: Emergency Medicine

## 2017-06-30 DIAGNOSIS — Z9114 Patient's other noncompliance with medication regimen: Secondary | ICD-10-CM

## 2017-06-30 DIAGNOSIS — M79605 Pain in left leg: Secondary | ICD-10-CM | POA: Diagnosis present

## 2017-06-30 DIAGNOSIS — F17210 Nicotine dependence, cigarettes, uncomplicated: Secondary | ICD-10-CM | POA: Diagnosis not present

## 2017-06-30 DIAGNOSIS — I5032 Chronic diastolic (congestive) heart failure: Secondary | ICD-10-CM | POA: Insufficient documentation

## 2017-06-30 DIAGNOSIS — I2510 Atherosclerotic heart disease of native coronary artery without angina pectoris: Secondary | ICD-10-CM | POA: Insufficient documentation

## 2017-06-30 DIAGNOSIS — Z955 Presence of coronary angioplasty implant and graft: Secondary | ICD-10-CM | POA: Diagnosis not present

## 2017-06-30 DIAGNOSIS — M79609 Pain in unspecified limb: Secondary | ICD-10-CM

## 2017-06-30 DIAGNOSIS — I82412 Acute embolism and thrombosis of left femoral vein: Secondary | ICD-10-CM | POA: Diagnosis not present

## 2017-06-30 DIAGNOSIS — J449 Chronic obstructive pulmonary disease, unspecified: Secondary | ICD-10-CM | POA: Insufficient documentation

## 2017-06-30 DIAGNOSIS — Z79899 Other long term (current) drug therapy: Secondary | ICD-10-CM | POA: Diagnosis not present

## 2017-06-30 DIAGNOSIS — Z86711 Personal history of pulmonary embolism: Secondary | ICD-10-CM

## 2017-06-30 LAB — CBC WITH DIFFERENTIAL/PLATELET
Basophils Absolute: 0 10*3/uL (ref 0.0–0.1)
Basophils Relative: 0 %
Eosinophils Absolute: 0.2 10*3/uL (ref 0.0–0.7)
Eosinophils Relative: 3 %
HCT: 46.1 % (ref 39.0–52.0)
Hemoglobin: 15.1 g/dL (ref 13.0–17.0)
Lymphocytes Relative: 19 %
Lymphs Abs: 1.3 10*3/uL (ref 0.7–4.0)
MCH: 30.9 pg (ref 26.0–34.0)
MCHC: 32.8 g/dL (ref 30.0–36.0)
MCV: 94.3 fL (ref 78.0–100.0)
Monocytes Absolute: 0.9 10*3/uL (ref 0.1–1.0)
Monocytes Relative: 13 %
Neutro Abs: 4.6 10*3/uL (ref 1.7–7.7)
Neutrophils Relative %: 65 %
Platelets: 100 10*3/uL — ABNORMAL LOW (ref 150–400)
RBC: 4.89 MIL/uL (ref 4.22–5.81)
RDW: 14 % (ref 11.5–15.5)
WBC: 7.1 10*3/uL (ref 4.0–10.5)

## 2017-06-30 LAB — BASIC METABOLIC PANEL
Anion gap: 11 (ref 5–15)
BUN: 7 mg/dL (ref 6–20)
CHLORIDE: 101 mmol/L (ref 101–111)
CO2: 25 mmol/L (ref 22–32)
Calcium: 9.3 mg/dL (ref 8.9–10.3)
Creatinine, Ser: 0.69 mg/dL (ref 0.61–1.24)
GFR calc non Af Amer: >60 mL/min (ref >60)
Glucose, Bld: 115 mg/dL — ABNORMAL HIGH (ref 65–99)
POTASSIUM: 3.6 mmol/L (ref 3.5–5.1)
SODIUM: 137 mmol/L (ref 135–145)

## 2017-06-30 MED ORDER — HYDROCODONE-ACETAMINOPHEN 5-325 MG PO TABS
1.0000 | ORAL_TABLET | Freq: Once | ORAL | Status: AC
  Administered 2017-06-30: 1 via ORAL
  Filled 2017-06-30: qty 1

## 2017-06-30 MED ORDER — APIXABAN 5 MG PO TABS
10.0000 mg | ORAL_TABLET | Freq: Two times a day (BID) | ORAL | Status: DC
  Administered 2017-06-30: 10 mg via ORAL
  Filled 2017-06-30: qty 2

## 2017-06-30 MED ORDER — ACETAMINOPHEN 325 MG PO TABS
650.0000 mg | ORAL_TABLET | Freq: Once | ORAL | Status: AC
Start: 2017-06-30 — End: 2017-06-30
  Administered 2017-06-30: 650 mg via ORAL
  Filled 2017-06-30: qty 2

## 2017-06-30 MED ORDER — APIXABAN 5 MG PO TABS: 5.0000 mg | ORAL_TABLET | Freq: Two times a day (BID) | ORAL | Status: DC

## 2017-06-30 MED ORDER — ELIQUIS 5 MG VTE STARTER PACK: ORAL_TABLET | ORAL | 0 refills | Status: DC

## 2017-06-30 MED ORDER — APIXABAN 5 MG PO TABS: 5.0000 mg | ORAL_TABLET | Freq: Two times a day (BID) | ORAL | 0 refills | Status: DC

## 2017-06-30 NOTE — ED Notes (Signed)
At approximately 1720; pt called out requesting help "off of the ground." pt found by staff AOx4, sitting upright on the ground on buttocks; stated had gotten out of bed wishing to use the bathroom but "his leg gave out" and he fell. Pt was still connected to monitors and, of note, had requested this RN to leave his side rail up at previous rounding, but side-rail was lowered when pt found. Pt had also been requesting to be admitted for pain control but did not meet admission requirements. Case management consult was to be provided prior to discharge.   Musculoskeletal and skin assessments done on patient, no further injury seen; pt denied further injury; stating only continued leg pain

## 2017-06-30 NOTE — ED Notes (Signed)
Vascular tech at bedside. °

## 2017-06-30 NOTE — ED Notes (Signed)
ED Provider at bedside. 

## 2017-06-30 NOTE — Discharge Instructions (Signed)
It is important that you take your eliquis as prescribed. Get rechecked immediately if you have any new or concerning symptoms.

## 2017-06-30 NOTE — ED Notes (Signed)
Pt ambulated independently with a steady gait. 

## 2017-06-30 NOTE — ED Triage Notes (Signed)
Pt. Stated, I think I have a blood clot in my left leg, its been hurting since Wed. Ive had plenty of clots before.

## 2017-06-30 NOTE — Care Management Note (Signed)
Case Management Note  Patient Details  Name: [name] MRN: [id number] Date of Birth: Jan 31, 1961  Subjective/Objective:        Pt presented for leg pain and DVT.  Pt has been off of Eliquis due to not being able to afford copay.    When asked what his copay is, pt states "I don't know".  Pt states he loaned his money out and would otherwise be able to afford medication.    Pt has [employer] Medicare and Medicaid.          Action/Plan: Pt advised that all of his prescriptions, including Eliquis, should be around $4.  Pt states he can pay that much and will fill prescription tonight or tomorrow.  Expected Discharge Date:                  Expected Discharge Plan:  Home/Self Care  In-House Referral:  NA  Discharge planning Services  CM Consult, Medication Assistance  Post Acute Care Choice:  NA Choice offered to:     DME Arranged:    DME Agency:     HH Arranged:    HH Agency:     Status of Service:  Completed, signed off  If discussed at [employer] of Stay Meetings, dates discussed:    Additional Comments:  Claudie Leach, RN 06/30/2017, 5:55 PM

## 2017-06-30 NOTE — ED Provider Notes (Signed)
[facility] EMERGENCY DEPARTMENT Provider Note   CSN: [id number] Arrival date & time: 06/30/17  1110     History   Chief Complaint Chief Complaint  Patient presents with  . Leg Pain    HPI [name] is a 57 y.o. male.  The history is provided by the patient. No language interpreter was used.  Leg Pain      [name] is a 57 y.o. male who presents to the Emergency Department complaining of leg pain. He reports three days of left thigh pain. He has a history of recurrent blood clots and this feels similar to prior blood clots. No known injuries. Pain is significant to the point that he has difficulty with ambulation secondary to pain. He is on Eliquis, BID. He took his last dose last night and now is currently out of eloquence. He initially stated that he did not miss any doses up until this point but on repeat discussions he does report that he misses his medications occasionally and has possibly only taken it once daily for the last week. He denies any fevers, abdominal pain, chest pain, shortness of breath, nausea, vomiting. No numbness/weakness.  Past Medical History:  Diagnosis Date  . Asthma   . CAD (coronary artery disease)    a. 09/2013 NSTEMI/PCI: LM nl, LAD 40-50%, D1 100 (2.25 x 28 Vision BMS), LCX min irregs, RI 60-70, 30, RCA 40-50/50-83ms/p, EF 45-50%.  b. cath 12/2014 -occulded BMS in diag, 50% pro to mid LAD, 60% ramus, 30% RCA   . Chest pain 08/2015  . Chronic diastolic CHF (congestive heart failure) ([place])    a. 09/2014 EF 45-50% by LV gram;  b. 01/2014 Echo: EF 55-60%, Gr 1 DD.  Marland Kitchen Cocaine abuse ([place])   . COPD (chronic obstructive pulmonary disease) ([place])   . DVT (deep venous thrombosis) ([place])    "I had ~ 10 in each leg" (12/20/2016)  . Essential hypertension   . Hepatitis C    "clear free over a year now"  . High cholesterol   . Myocardial infarction ([place]) ~ 2014  . Osteoarthritis    a. hands and toes.  . Pneumonia    "several  times" (12/20/2016)  . Pulmonary embolism ([place])    a. 2012 - s/p IVC filter;  b. prev on eliquis - noncompliant.  . Shortness of breath dyspnea   . Sinus headache   . Squamous cell skin cancer 07/13/2016   "foot"  . Tobacco abuse     Patient Active Problem List   Diagnosis Date Noted  . GERD (gastroesophageal reflux disease) 04/24/2017  . COPD exacerbation ([place]) 04/05/2017  . Chronic sinusitis 02/02/2017  . COPD with acute exacerbation ([place]) 12/20/2016  . History of squamous cell carcinoma 08/03/2016  . Psoriasis 08/03/2016  . Squamous cell skin cancer 07/13/2016  . Chronic anticoagulation 02/01/2016  . Malnutrition of moderate degree 06/05/2015  . Liver fibrosis 04/08/2015  . COPD (chronic obstructive pulmonary disease) ([place]) 01/01/2015  . Essential hypertension 01/01/2015  . Unstable angina ([place]) 01/01/2015  . History of coronary artery disease   . Cocaine abuse ([place]) 12/01/2014  . Pleuritic chest pain 12/01/2014  . Chronic diastolic heart failure ([place]) 12/01/2014  . QT prolongation 12/01/2014  . Chronic hepatitis C without hepatic coma ([place]) 06/18/2014  . HTN (hypertension) 04/27/2014  . Coronary artery disease involving native coronary artery of native heart without angina pectoris   . CAD (coronary artery disease) 10/21/2013  . NSTEMI (non-ST elevated myocardial infarction) (  [place]) 10/21/2013  . Anemia 06/20/2013  . Regular alcohol consumption 06/18/2013  . History of DVT (deep vein thrombosis) 02/21/2013  . Thrombocytopenia ([place]) 08/09/2012  . Tobacco abuse 08/09/2012  . History of noncompliance with medical treatment 03/24/2012  . Atopic dermatitis 03/24/2012  . History of pulmonary embolism 02/10/2012    Past Surgical History:  Procedure Laterality Date  . CARDIAC CATHETERIZATION N/A 01/04/2015   Procedure: Left Heart Cath and Coronary Angiography;  Surgeon: Burnell Blanks, MD;  Location: [facility];  Service: Cardiovascular;  Laterality: N/A;  .  CORONARY ANGIOPLASTY WITH STENT PLACEMENT  10/21/13   BMS to D1  . CYST EXCISION N/A 05/10/2016   Procedure: EXCISION OF SEBACEOUS CYST UPPER BACK;  Surgeon: Donnie Mesa, MD;  Location: [facility];  Service: General;  Laterality: N/A;  . FOOT SURGERY Right ~ 06/2016   "said there was skin cancer on it"  . LEFT HEART CATHETERIZATION WITH CORONARY ANGIOGRAM N/A 10/21/2013   Procedure: LEFT HEART CATHETERIZATION WITH CORONARY ANGIOGRAM;  Surgeon: Leonie Man, MD;  Location: [facility] C[facility] LAB;  Service: Cardiovascular;  Laterality: N/A;  . VENA CAVA FILTER PLACEMENT  ~ 2007-~ 2017   "1 in my neck; 2 in my wrist"        Home Medications    Prior to Admission medications   Medication Sig Start Date End Date Taking? Authorizing Provider  acetaminophen (TYLENOL) 325 MG tablet Take 650 mg by mouth every 6 (six) hours as needed for mild pain.    [provider]  albuterol (PROVENTIL) (2.5 MG/3ML) 0.083% nebulizer solution Take 3 mLs (2.5 mg total) every 6 (six) hours as needed by nebulization for wheezing or shortness of breath. 02/02/17   Charlott Rakes, MD  apixaban (ELIQUIS) 5 MG TABS tablet Take 1 tablet (5 mg total) by mouth 2 (two) times daily. 06/30/17   Quintella Reichert, MD  atorvastatin (LIPITOR) 40 MG tablet Take 1 tablet (40 mg total) by mouth daily at 6 PM. 04/24/17   Charlott Rakes, MD  benzonatate (TESSALON) 100 MG capsule Take 1 capsule (100 mg total) by mouth 2 (two) times daily as needed for cough. 05/15/17   Charlott Rakes, MD  diclofenac sodium (VOLTAREN) 1 % GEL Apply 4 g 4 (four) times daily topically. 02/02/17   Charlott Rakes, MD  ELIQUIS STARTER PACK (ELIQUIS STARTER PACK) 5 MG TABS Take as directed on package: start with two-5mg  tablets twice daily for 7 days. On day 8, switch to one-5mg  tablet twice daily. 06/30/17   Quintella Reichert, MD  fluticasone [facility]) 50 MCG/ACT nasal spray Place 2 sprays into both nostrils 2 (two) times daily. 03/30/17   [provider]    Fluticasone-Salmeterol (ADVAIR DISKUS) 250-50 MCG/DOSE AEPB Inhale 1 puff into the lungs 2 (two) times daily. 04/24/17   Charlott Rakes, MD  gabapentin (NEURONTIN) 300 MG capsule Take 1 capsule (300 mg total) by mouth 2 (two) times daily. 04/24/17   Charlott Rakes, MD  pantoprazole (PROTONIX) 40 MG tablet Take 1 tablet (40 mg total) by mouth daily. 04/24/17   Charlott Rakes, MD  predniSONE (DELTASONE) 10 MG tablet 6 tablets for 3 days, 5 tablets for 3 days, 4 tablets for 3 days, 3 tablets for 3 days, 2 tablets for 3 days, 1 tablet for 3 days then discontinue Patient not taking: Reported on 05/15/2017 04/10/17   Reyne Dumas, MD  tiZANidine (ZANAFLEX) 4 MG tablet Take 1 tablet (4 mg total) by mouth every 8 (eight) hours as needed for  muscle spasms. 05/15/17   Charlott Rakes, MD  traMADol (ULTRAM) 50 MG tablet Take 1 tablet (50 mg total) by mouth every 12 (twelve) hours as needed. 05/15/17 05/15/18  Charlott Rakes, MD  VENTOLIN HFA 108 (90 Base) MCG/ACT inhaler Inhale 2 puffs into the lungs every 6 (six) hours as needed for wheezing or shortness of breath. 04/24/17   Charlott Rakes, MD    Family History Family History  Problem Relation Age of Onset  . Asthma Mother   . Allergies Mother   . Allergies Sister   . Allergies Brother   . Deep vein thrombosis Brother        two brothers with recurrent DVT  . Heart attack Father        a.60s b. deceased in his 53s  . Coronary artery disease Father     Social History Social History   Tobacco Use  . Smoking status: Current Every Day Smoker    Packs/day: 0.25    Years: 39.00    Pack years: 9.75    Types: Cigarettes, Cigars  . Smokeless tobacco: Never Used  Substance Use Topics  . Alcohol use: Yes    Alcohol/week: 6.6 oz    Types: 7 Cans of beer, 4 Shots of liquor per week    Comment: 12/20/2016 "couple 40's on the weekend; shot qod"  . Drug use: Yes    Types: Cocaine    Comment: 12/20/2016 "might have used some the other day; I'm not sure"      Allergies   No known allergies   Review of Systems Review of Systems  All other systems reviewed and are negative.    Physical Exam Updated Vital Signs BP 124/76   Pulse 79   Temp 99.3 F (37.4 C) (Oral)   Resp 16   Ht 6\' 2"  (1.88 m)   Wt 89.4 kg (197 lb)   SpO2 98%   BMI 25.29 kg/m   Physical Exam  Constitutional: He is oriented to person, place, and time. He appears well-developed and well-nourished.  HENT:  Head: Normocephalic and atraumatic.  Cardiovascular: Normal rate and regular rhythm.  No murmur heard. Pulmonary/Chest: Effort normal and breath sounds normal. No respiratory distress.  Abdominal: Soft. There is no tenderness. There is no rebound and no guarding.  Musculoskeletal:  2+ DP pulses bilaterally.  Trace pitting edema to LLE.  Mild tenderness to palpation over left thigh and inguinal canal without erythema or mass.    Neurological: He is alert and oriented to person, place, and time.  Skin: Skin is warm and dry.  Psychiatric: He has a normal mood and affect. His behavior is normal.  Nursing note and vitals reviewed.    ED Treatments / Results  Labs (all labs ordered are listed, but only abnormal results are displayed) Labs Reviewed  BASIC METABOLIC PANEL - Abnormal; Notable for the following components:      Result Value   Glucose, Bld 115 (*)    All other components within normal limits  CBC WITH DIFFERENTIAL/PLATELET - Abnormal; Notable for the following components:   Platelets 100 (*)    All other components within normal limits    EKG None  Radiology No results found.  Procedures Procedures (including critical care time)  Medications Ordered in ED Medications  apixaban (ELIQUIS) tablet 10 mg (has no administration in time range)  HYDROcodone-acetaminophen (NORCO/VICODIN) 5-325 MG per tablet 1 tablet (1 tablet Oral Given 06/30/17 1245)     Initial Impression / Assessment and Plan /  ED Course  I have reviewed the triage  vital signs and the nursing notes.  Pertinent labs & imaging results that were available during my care of the patient were reviewed by me and considered in my medical decision making (see chart for details).     Patient with history of DVT on eliquis therapy here for recurrent left leg pain. Imaging does demonstrate recurrent left lower extremity DVT. Patient is not fully compliant with his Eliquis therapy and is taking medication most days but not taking it twice daily as directed. Discussed with on-call Oncologist, Dr. Alvy Bimler. She recommends either continuing Eliquis therapy or switching to Xarelto if there is a cost issue. Discussed with patient treatment options of continuing Eliquis versus changing to Xarelto and patient prefers to continue on Eliquis therapy. Discussed with patient importance of medication compliance. He currently has no chest pain or shortness of breath, no concerns for acute pulmonary embolism at this time. Discussed close outpatient follow-up as well as return precautions.  Final Clinical Impressions(s) / ED Diagnoses   Final diagnoses:  Acute deep vein thrombosis (DVT) of femoral vein of left lower extremity (HCC)  Non compliance w medication regimen    ED Discharge Orders        Ordered    apixaban (ELIQUIS) 5 MG TABS tablet  2 times daily     06/30/17 1536    ELIQUIS STARTER PACK (ELIQUIS STARTER PACK) 5 MG TABS     06/30/17 1539       Quintella Reichert, MD 06/30/17 1550

## 2017-06-30 NOTE — ED Notes (Signed)
Pt provided with sandwich bag

## 2017-06-30 NOTE — Progress Notes (Signed)
Left lower extremity venous duplex has been completed. There is evidence of age indeterminate deep vein thrombosis involving the external iliac, common femoral, femoral, profunda femoral, popliteal, intramuscular gastrocnemius, and posterior tibial veins of the left lower extremity. There is also evidence of age indeterminate superficial vein thrombosis involving the greater saphenous vein of the left lower extremity. Results were given to Dr. Ralene Bathe.  06/30/17 1:32 PM [name] RVT

## 2017-07-06 IMAGING — CT CT ANGIO CHEST
2 of 10 series · 19 of 46 positions shown · IV contrast (OMNI)
Comparison: 12/31/2014

CLINICAL DATA: Elevated D-dimer.

EXAM:
CT ANGIOGRAPHY CHEST WITH CONTRAST
TECHNIQUE: Multidetector CT imaging of the chest was performed using the
standard protocol during bolus administration of intravenous
contrast. Multiplanar CT image reconstructions and MIPs were
obtained to evaluate the vascular anatomy.
CONTRAST:  100 mL Isovue 370

[Series 6: thins · axial · 0.69mm/px · z∈[+1135,+1400]mm · 16 of 299 slices shown]
[im 17/299  lung]
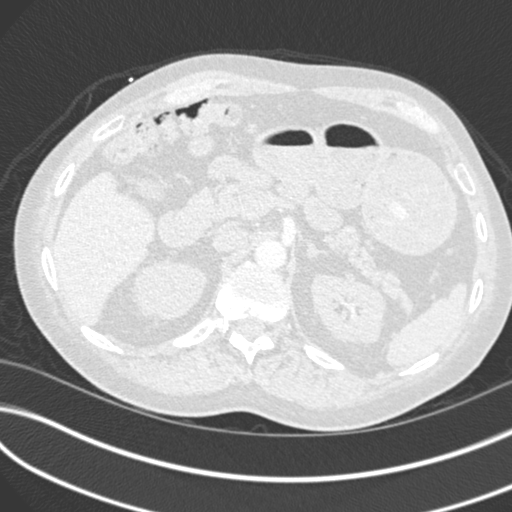
[im 34/299  soft-tissue]
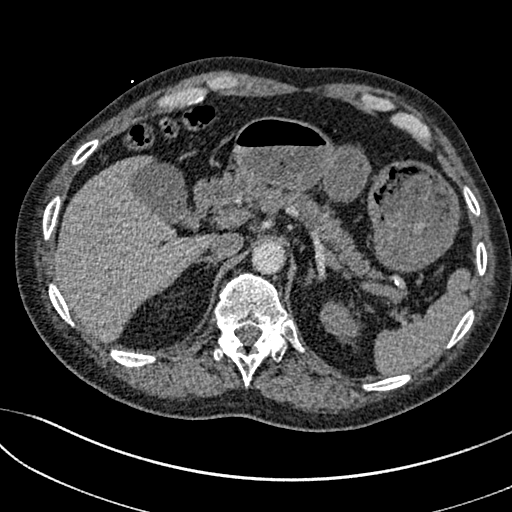
[im 50/299  lung]
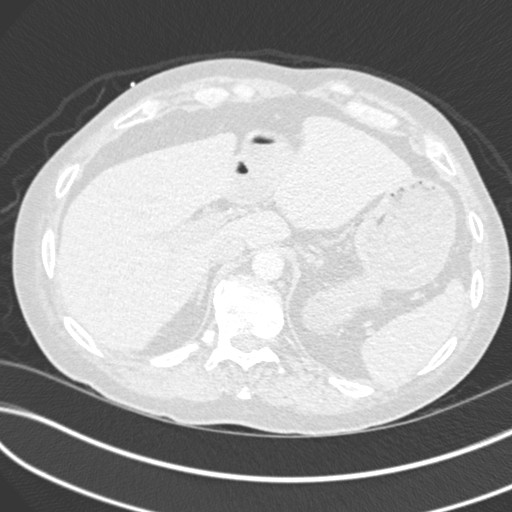
[im 67/299  soft-tissue]
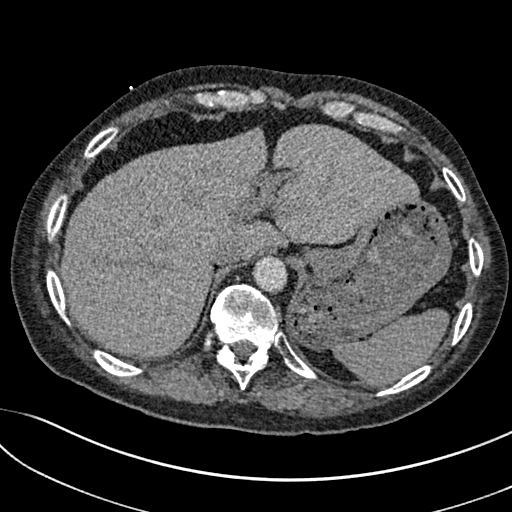
[im 83/299  lung]
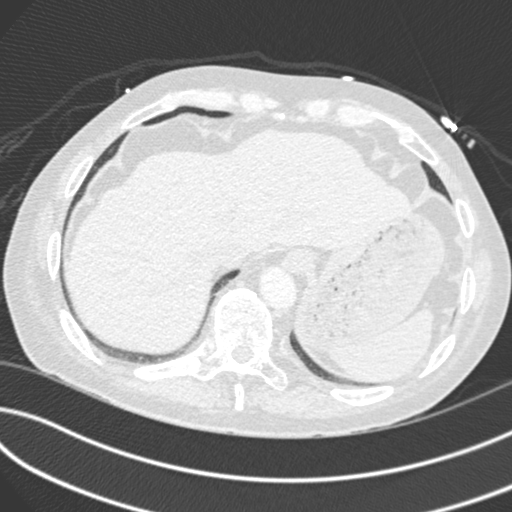
[im 100/299  soft-tissue]
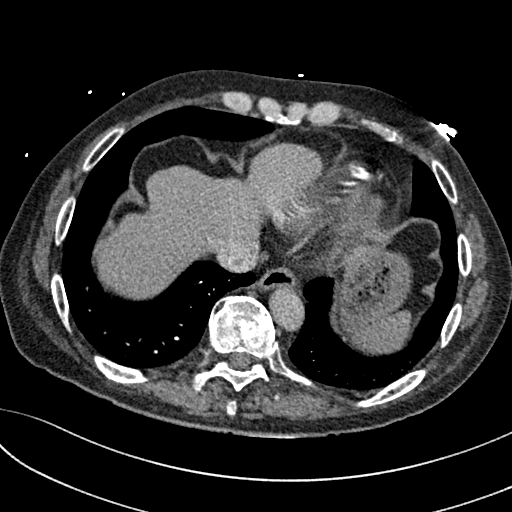
[im 116/299  lung]
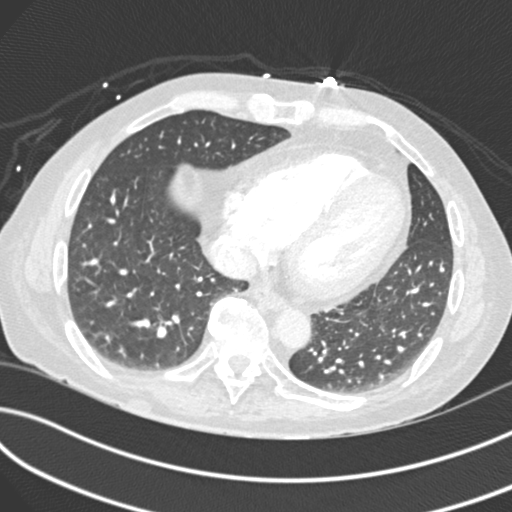
[im 133/299  soft-tissue]
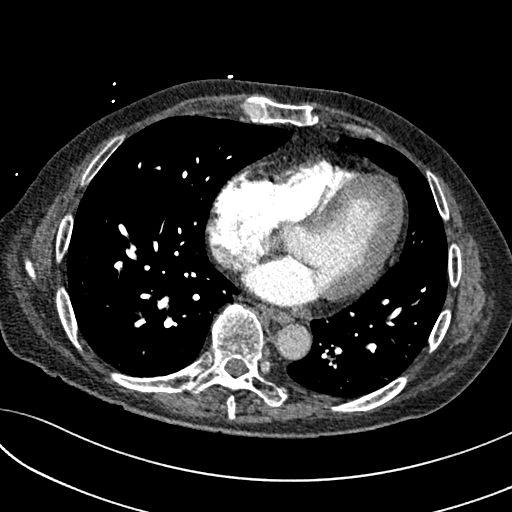
[im 166/299  lung]
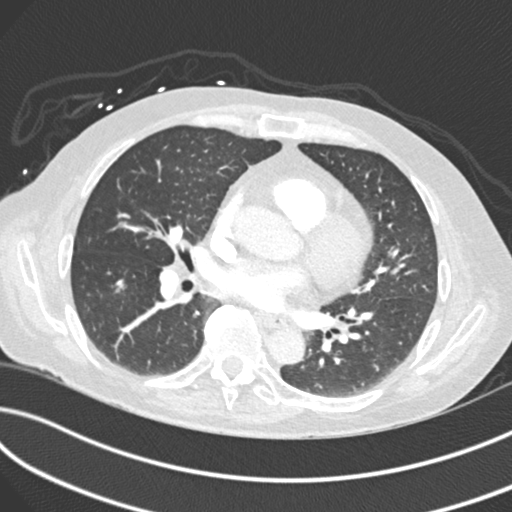
[im 183/299  soft-tissue]
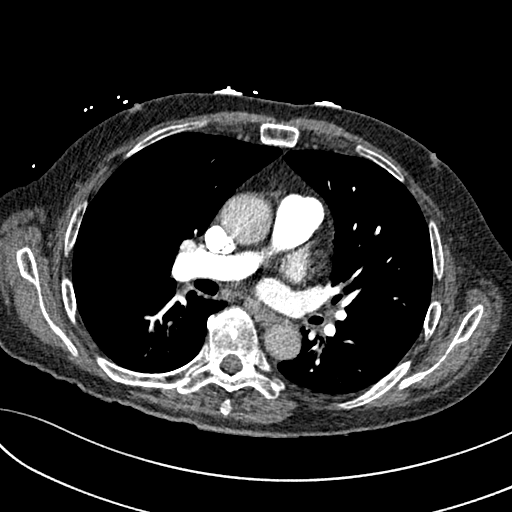
[im 199/299  lung]
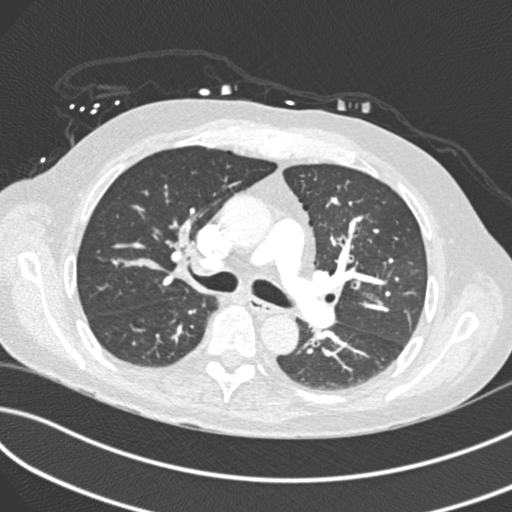
[im 216/299  soft-tissue]
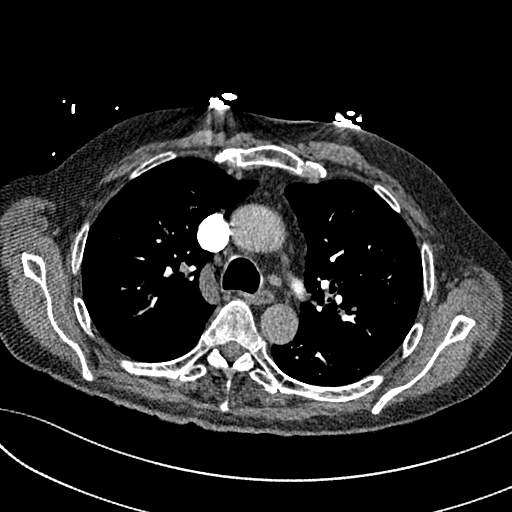
[im 232/299  lung]
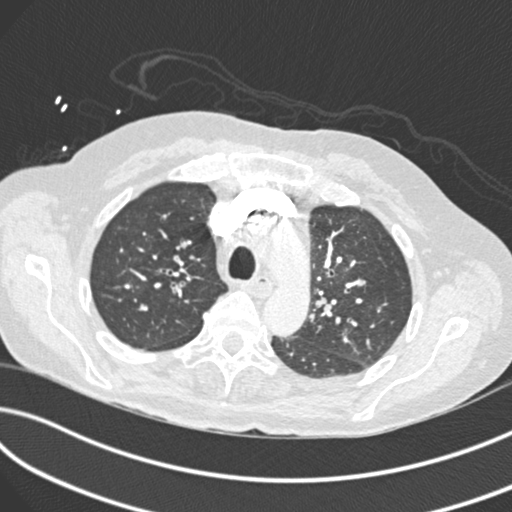
[im 249/299  soft-tissue]
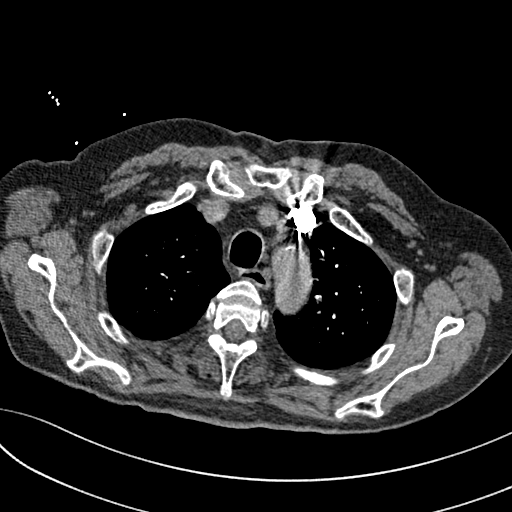
[im 265/299  lung]
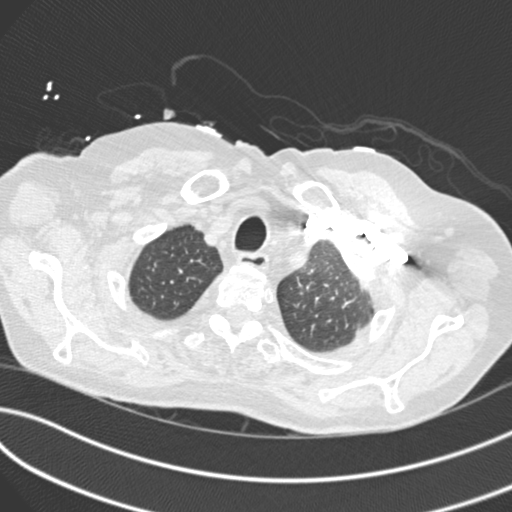
[im 282/299  soft-tissue]
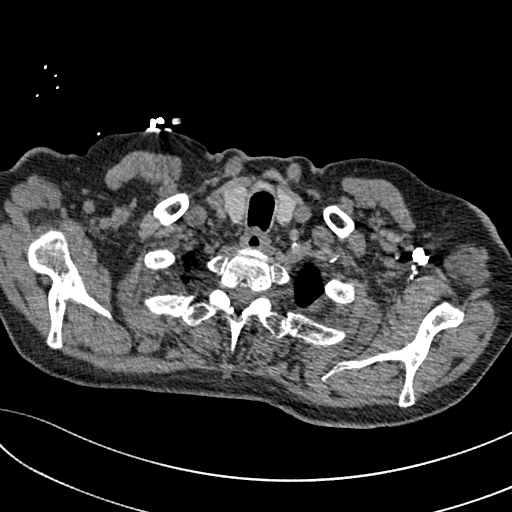

[Series 8: coronal mpr · coronal · 0.59mm/px · 3 of 120 slices shown]
[im 30/120  soft-tissue]
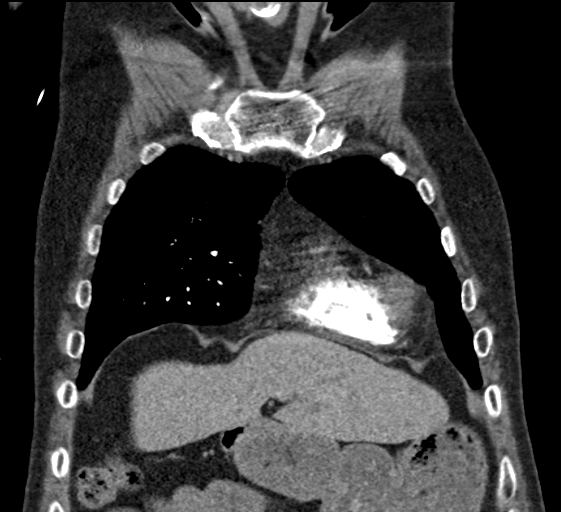
[im 60/120  soft-tissue]
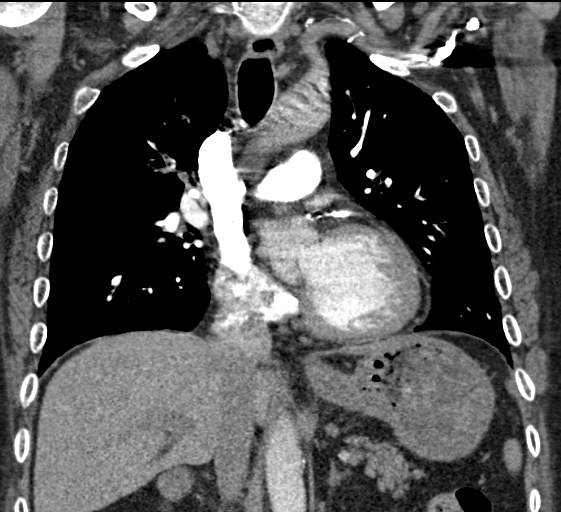
[im 90/120  soft-tissue]
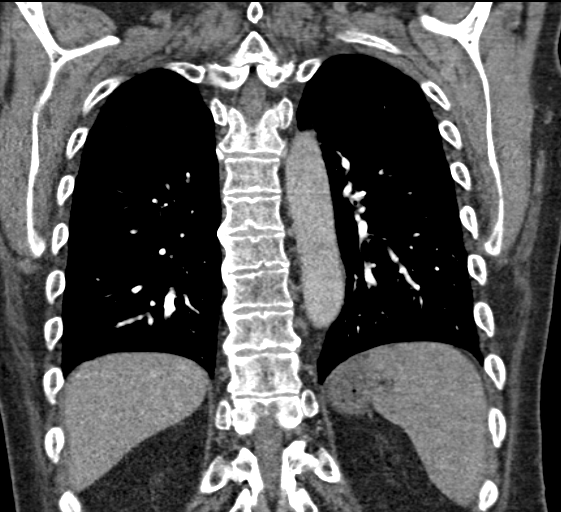

[19 of 46 positions shown; findings below may reference images not displayed]

FINDINGS: Technically adequate study with good opacification of the central
and segmental pulmonary arteries. No focal filling defects
demonstrated. No evidence of significant pulmonary embolus.

Normal heart size. Normal caliber thoracic aorta. Coronary artery
calcifications. Mediastinal lymph nodes are not pathologically
enlarged. Esophagus is mostly decompressed.

Evaluation of lungs is somewhat limited due to motion artifact.
There is evidence of bronchial wall thickening with some mucous
plugging or secretions in the upper lobe bronchi. Mild tree-in-bud
pattern in the lung bases. Changes may represent bronchiolitis or
other inflammatory process. No focal consolidation. No pneumothorax.
No pleural effusions. In the

Included portions of the upper abdominal organs are grossly
unremarkable. Degenerative changes in the spine.

Review of the MIP images confirms the above findings.
IMPRESSION: No evidence of significant pulmonary embolus. Bronchial wall
thickening with mucous plugging and tree-in-bud infiltrative pattern
suggesting small airways inflammatory disease or bronchopneumonia.

## 2017-07-25 ENCOUNTER — Telehealth: Payer: Self-pay | Admitting: Family Medicine

## 2017-07-25 ENCOUNTER — Other Ambulatory Visit: Payer: Self-pay

## 2017-07-25 DIAGNOSIS — M25571 Pain in right ankle and joints of right foot: Secondary | ICD-10-CM

## 2017-07-25 MED ORDER — DICLOFENAC SODIUM 1 % TD GEL: 4.0000 g | Freq: Four times a day (QID) | TRANSDERMAL | 1 refills | Status: DC

## 2017-07-25 NOTE — Telephone Encounter (Signed)
Done

## 2017-07-25 NOTE — Telephone Encounter (Signed)
Patient called called and requested for listed medication to be refilled  diclofenac sodium (VOLTAREN) 1 % GEL [[id number]]  [facility] on [address]

## 2017-08-10 ENCOUNTER — Ambulatory Visit (INDEPENDENT_AMBULATORY_CARE_PROVIDER_SITE_OTHER): Payer: Medicare Other

## 2017-08-10 ENCOUNTER — Ambulatory Visit (INDEPENDENT_AMBULATORY_CARE_PROVIDER_SITE_OTHER): Payer: Medicare Other | Admitting: Podiatry

## 2017-08-10 ENCOUNTER — Encounter: Payer: Self-pay | Admitting: Podiatry

## 2017-08-10 DIAGNOSIS — M7751 Other enthesopathy of right foot: Secondary | ICD-10-CM

## 2017-08-10 DIAGNOSIS — M7752 Other enthesopathy of left foot: Secondary | ICD-10-CM | POA: Diagnosis not present

## 2017-08-10 DIAGNOSIS — M2011 Hallux valgus (acquired), right foot: Secondary | ICD-10-CM

## 2017-08-10 DIAGNOSIS — M779 Enthesopathy, unspecified: Secondary | ICD-10-CM

## 2017-08-10 MED ORDER — METHYLPREDNISOLONE 4 MG PO TBPK: ORAL_TABLET | ORAL | 0 refills | Status: DC

## 2017-08-13 NOTE — Progress Notes (Signed)
Subjective: 57 year old male presents the office today for concerns of pain on the bunion site.  He states the area is still sore when he points on the medial first metatarsal head region discomfort.  No recent injury.  He also has secondary concerns of having some swelling and pain in the left ankle over the last couple of days but denies any recent injury to the area.  He said no recent treatment for the left ankle. Denies any systemic complaints such as fevers, chills, nausea, vomiting. No acute changes since last appointment, and no other complaints at this time.   Objective: AAO x3, NAD DP/PT pulses palpable bilaterally, CRT less than 3 seconds Scar from the prior surgery in the right foot is well-healed.  Mild tenderness to the medial aspect the first metatarsal head but there is no edema, erythema, increase in warmth.  There is no other area of tenderness.  There is no pain with MPJ range of motion.   On the left ankle there is mild diffuse tenderness to the anterior as well as the medial aspect of the ankle on the course of the flexor tendons.  Tendons appear to be intact.  Achilles tendon intact.  Ankle joint range of motion intact without any crepitation.  No open lesions or pre-ulcerative lesions.  No pain with calf compression, swelling, warmth, erythema  Assessment: Tendinitis left ankle with capsulitis right foot  Plan: -All treatment options discussed with the patient including all alternatives, risks, complications.  -X-rays were obtained and reviewed.  There is no evidence of acute fracture or stress fracture.  In regard to the right foot we discussed a steroid injection he wished to proceed with.  The skin was prepped with Betadine and then a mixture of 1 cc of Kenalog 10, 0.5 cc of 0.5% Marcaine plain and 0.5 cc of 2% lidocaine plain was infiltrated around the first MTPJ with any complications on the medial aspect.  Post procedure instructions were discussed.  Also prescribed  Voltaren gel. -In regard to left ankle to recommend an ankle brace which was dispensed today.  He can use topical anti-inflammatory for this as well.  Ice to the area daily. -Follow-up as scheduled or sooner if needed. -Patient encouraged to call the office with any questions, concerns, change in symptoms.   Trula Slade DPM

## 2017-08-17 ENCOUNTER — Other Ambulatory Visit: Payer: Self-pay | Admitting: Family Medicine

## 2017-08-17 ENCOUNTER — Ambulatory Visit: Payer: Medicare Other | Attending: Family Medicine | Admitting: Family Medicine

## 2017-08-17 ENCOUNTER — Encounter: Payer: Self-pay | Admitting: Family Medicine

## 2017-08-17 ENCOUNTER — Other Ambulatory Visit: Payer: Self-pay

## 2017-08-17 VITALS — BP 146/94 | HR 74 | Temp 98.4°F | Resp 16 | Wt 198.0 lb

## 2017-08-17 DIAGNOSIS — J438 Other emphysema: Secondary | ICD-10-CM

## 2017-08-17 DIAGNOSIS — I5032 Chronic diastolic (congestive) heart failure: Secondary | ICD-10-CM | POA: Insufficient documentation

## 2017-08-17 DIAGNOSIS — Z09 Encounter for follow-up examination after completed treatment for conditions other than malignant neoplasm: Secondary | ICD-10-CM | POA: Diagnosis not present

## 2017-08-17 DIAGNOSIS — M546 Pain in thoracic spine: Principal | ICD-10-CM

## 2017-08-17 DIAGNOSIS — Z7901 Long term (current) use of anticoagulants: Secondary | ICD-10-CM | POA: Insufficient documentation

## 2017-08-17 DIAGNOSIS — G8929 Other chronic pain: Secondary | ICD-10-CM

## 2017-08-17 DIAGNOSIS — E7800 Pure hypercholesterolemia, unspecified: Secondary | ICD-10-CM | POA: Diagnosis not present

## 2017-08-17 DIAGNOSIS — M1990 Unspecified osteoarthritis, unspecified site: Secondary | ICD-10-CM | POA: Insufficient documentation

## 2017-08-17 DIAGNOSIS — R29898 Other symptoms and signs involving the musculoskeletal system: Secondary | ICD-10-CM

## 2017-08-17 DIAGNOSIS — Z86718 Personal history of other venous thrombosis and embolism: Secondary | ICD-10-CM

## 2017-08-17 DIAGNOSIS — Z955 Presence of coronary angioplasty implant and graft: Secondary | ICD-10-CM | POA: Insufficient documentation

## 2017-08-17 DIAGNOSIS — Z79891 Long term (current) use of opiate analgesic: Secondary | ICD-10-CM | POA: Insufficient documentation

## 2017-08-17 DIAGNOSIS — Z9889 Other specified postprocedural states: Secondary | ICD-10-CM | POA: Insufficient documentation

## 2017-08-17 DIAGNOSIS — J439 Emphysema, unspecified: Secondary | ICD-10-CM | POA: Insufficient documentation

## 2017-08-17 DIAGNOSIS — I110 Hypertensive heart disease with heart failure: Secondary | ICD-10-CM | POA: Diagnosis not present

## 2017-08-17 DIAGNOSIS — Z95828 Presence of other vascular implants and grafts: Secondary | ICD-10-CM | POA: Diagnosis not present

## 2017-08-17 DIAGNOSIS — I25119 Atherosclerotic heart disease of native coronary artery with unspecified angina pectoris: Secondary | ICD-10-CM | POA: Insufficient documentation

## 2017-08-17 DIAGNOSIS — G629 Polyneuropathy, unspecified: Secondary | ICD-10-CM | POA: Insufficient documentation

## 2017-08-17 DIAGNOSIS — Z86711 Personal history of pulmonary embolism: Secondary | ICD-10-CM | POA: Insufficient documentation

## 2017-08-17 DIAGNOSIS — M25572 Pain in left ankle and joints of left foot: Secondary | ICD-10-CM | POA: Diagnosis not present

## 2017-08-17 DIAGNOSIS — I252 Old myocardial infarction: Secondary | ICD-10-CM | POA: Diagnosis not present

## 2017-08-17 DIAGNOSIS — K219 Gastro-esophageal reflux disease without esophagitis: Secondary | ICD-10-CM | POA: Diagnosis not present

## 2017-08-17 DIAGNOSIS — Z7952 Long term (current) use of systemic steroids: Secondary | ICD-10-CM | POA: Diagnosis not present

## 2017-08-17 DIAGNOSIS — Z79899 Other long term (current) drug therapy: Secondary | ICD-10-CM | POA: Diagnosis not present

## 2017-08-17 DIAGNOSIS — R531 Weakness: Secondary | ICD-10-CM | POA: Insufficient documentation

## 2017-08-17 MED ORDER — ALBUTEROL SULFATE HFA 108 (90 BASE) MCG/ACT IN AERS: 2.0000 | INHALATION_SPRAY | Freq: Four times a day (QID) | RESPIRATORY_TRACT | 3 refills | Status: DC | PRN

## 2017-08-17 MED ORDER — PANTOPRAZOLE SODIUM 40 MG PO TBEC: 40.0000 mg | DELAYED_RELEASE_TABLET | Freq: Every day | ORAL | 3 refills | Status: DC

## 2017-08-17 MED ORDER — FLUTICASONE-SALMETEROL 250-50 MCG/DOSE IN AEPB: 1.0000 | INHALATION_SPRAY | Freq: Two times a day (BID) | RESPIRATORY_TRACT | 3 refills | Status: DC

## 2017-08-17 MED ORDER — ATORVASTATIN CALCIUM 40 MG PO TABS: 40.0000 mg | ORAL_TABLET | Freq: Every day | ORAL | 3 refills | Status: DC

## 2017-08-17 MED ORDER — TIZANIDINE HCL 4 MG PO TABS: 4.0000 mg | ORAL_TABLET | Freq: Three times a day (TID) | ORAL | 1 refills | Status: DC | PRN

## 2017-08-17 MED ORDER — APIXABAN 5 MG PO TABS: 5.0000 mg | ORAL_TABLET | Freq: Two times a day (BID) | ORAL | 3 refills | Status: DC

## 2017-08-17 MED ORDER — GABAPENTIN 300 MG PO CAPS: 300.0000 mg | ORAL_CAPSULE | Freq: Two times a day (BID) | ORAL | 3 refills | Status: DC

## 2017-08-17 MED ORDER — CETIRIZINE HCL 10 MG PO TABS: 10.0000 mg | ORAL_TABLET | Freq: Every day | ORAL | 1 refills | Status: DC

## 2017-08-17 NOTE — Patient Instructions (Signed)
Deep Vein Thrombosis Deep vein thrombosis (DVT) is a condition in which a blood clot forms in a deep vein, such as a lower leg, thigh, or arm vein. A clot is blood that has thickened into a gel or solid. This condition is dangerous. It can lead to serious and even life-threatening complications if the clot travels to the lungs and causes a blockage (pulmonary embolism). It can also damage veins in the leg. This can result in leg pain, swelling, discoloration, and sores (post-thrombotic syndrome). What are the causes? This condition may be caused by:  A slowdown of blood flow.  Damage to a vein.  A condition that makes blood clot more easily.  What increases the risk? The following factors may make you more likely to develop this condition:  Being overweight.  Being elderly, especially over age 60.  Sitting or lying down for more than four hours.  Lack of physical activity (sedentary lifestyle).  Being pregnant, giving birth, or having recently given birth.  Taking medicines that contain estrogen.  Smoking.  A history of any of the following: ? Blood clots or blood clotting disease. ? Peripheral vascular disease. ? Inflammatory bowel disease. ? Cancer. ? Heart disease. ? Genetic conditions that affect how blood clots. ? Neurological diseases that affect the legs (leg paresis). ? Injury. ? Major or lengthy surgery. ? A central line placed inside a large vein.  What are the signs or symptoms? Symptoms of this condition include:  Swelling, pain, or tenderness in an arm or leg.  Warmth, redness, or discoloration in an arm or leg.  If the clot is in your leg, symptoms may be more noticeable or worse when you stand or walk. Some people do not have any symptoms. How is this diagnosed? This condition is diagnosed with:  A medical history.  A physical exam.  Tests, such as: ? Blood tests. These are done to see how your blood clots. ? Imaging tests. These are done to  check for clots. Tests may include:  Ultrasound.  CT scan.  MRI.  X-ray.  Venogram. For this test, X-rays are taken after a dye is injected into a vein.  How is this treated? Treatment for this condition depends on the cause, your risk for bleeding or developing more clots, and any medical conditions you have. Treatment may include:  Taking blood thinners (also called anticoagulants). These medicines may be taken by mouth, injected under the skin, or injected through an IV tube (catheter). These medicines prevent clots from forming.  Injecting medicine that dissolves blood clots into the affected vein (catheter-directed thrombolysis).  Having surgery. Surgery may be done to: ? Remove the clot. ? Place a filter in a large vein to catch blood clots before they reach the lungs.  Some treatments may be continued for up to six months. Follow these instructions at home: If you are taking an oral blood thinner:  Take the medicine exactly as told by your health care provider. Some blood thinners need to be taken at the same time every day. Do not skip a dose.  Ask your health care provider about what foods and drugs interact with the medicine.  Ask about possible side effects. General instructions  Blood thinners can cause easy bruising and difficulty stopping bleeding. Because of this, if you are taking or were given a blood thinner: ? Hold pressure over cuts for longer than usual. ? Tell your dentist and other health care providers that you are taking blood thinners before   having any procedures that can cause bleeding. ? Avoid contact sports.  Take over-the-counter and prescription medicines only as told by your health care provider.  Return to your normal activities as told by your health care provider. Ask your health care provider what activities are safe for you.  Wear compression stockings if recommended by your health care provider.  Keep all follow-up visits as told by  your health care provider. This is important. How is this prevented? To lower your risk of developing this condition again:  For 30 or more minutes every day, do an activity that: ? Involves moving your arms and legs. ? Increases your heart rate.  When traveling for longer than four hours: ? Exercise your arms and legs every hour. ? Drink plenty of water. ? Avoid drinking alcohol.  Avoid sitting or lying for a long time without moving your legs.  Stay a healthy weight.  If you are a woman who is older than age 35, avoid unnecessary use of medicines that contain estrogen.  Do not use any products that contain nicotine or tobacco, such as cigarettes and e-cigarettes. This is especially important if you take estrogen medicines. If you need help quitting, ask your health care provider.  Contact a health care provider if:  You miss a dose of your blood thinner.  You have nausea, vomiting, or diarrhea that lasts for more than one day.  Your menstrual period is heavier than usual.  You have unusual bruising. Get help right away if:  You have new or increased pain, swelling, or redness in an arm or leg.  You have numbness or tingling in an arm or leg.  You have shortness of breath.  You have chest pain.  You have a rapid or irregular heartbeat.  You feel light-headed or dizzy.  You cough up blood.  There is blood in your vomit, stool, or urine.  You have a serious fall or accident, or you hit your head.  You have a severe headache or confusion.  You have a cut that will not stop bleeding. These symptoms may represent a serious problem that is an emergency. Do not wait to see if the symptoms will go away. Get medical help right away. Call your local emergency services (911 in the [place] Do not drive yourself to the hospital. Summary  DVT is a condition in which a blood clot forms in a deep vein, such as a lower leg, thigh, or arm vein.  Symptoms can include swelling,  warmth, pain, and redness in your leg or arm.  Treatment may include taking blood thinners, injecting medicine that dissolves blood clots,wearing compression stockings, or surgery.  If you are prescribed blood thinners, take them exactly as told. This information is not intended to replace advice given to you by your health care provider. Make sure you discuss any questions you have with your health care provider. Document Released: 03/13/2005 Document Revised: 04/15/2016 Document Reviewed: 04/15/2016 Elsevier Interactive Patient Education  2018 [employer]  

## 2017-08-17 NOTE — Progress Notes (Signed)
Subjective:  Patient ID: [name], male    DOB: [birth year]  Age: 57 y.o. MRN: [id number]  CC: Follow-up   HPI [name] is a 57 year old male with a history of COPD, chronic sinusitis, recurrent DVT and PE (on anticoagulation with Eliquis), coronary artery disease (status post stent, last cardiac cath from 12/2014 revealed two-vessel CAD, medical management recommended), tobacco abuse who presents today for follow-up visit. He diagnosed with left lower extremity DVT in 06/30/2017 and placed on Eliquis.  He informs me he had been off Eliquis for several months prior to his most recent diagnosis. He also has left ankle swelling and pain which was diagnosed as left ankle tendinitis by Dr. Jacqualyn Posey of podiatry and he was placed on a prednisone Medrol Dosepak which he has completed and reports improvement in symptoms but his L ankle continues to be swollen.  He has a follow-up with Dr. Jacqualyn Posey next week. He continues to complain of weakness in his lower extremities with difficulty rising up from a seated position without using his hands to push off and this becomes more difficult closer he is to the ground.  He has no upper extremity weakness. His emphysema has been controlled and he denies chest pains.  Past Medical History:  Diagnosis Date  . Asthma   . CAD (coronary artery disease)    a. 09/2013 NSTEMI/PCI: LM nl, LAD 40-50%, D1 100 (2.25 x 28 Vision BMS), LCX min irregs, RI 60-70, 30, RCA 40-50/50-95ms/p, EF 45-50%.  b. cath 12/2014 -occulded BMS in diag, 50% pro to mid LAD, 60% ramus, 30% RCA   . Chest pain 08/2015  . Chronic diastolic CHF (congestive heart failure) ([place])    a. 09/2014 EF 45-50% by LV gram;  b. 01/2014 Echo: EF 55-60%, Gr 1 DD.  Marland Kitchen Cocaine abuse ([place])   . COPD (chronic obstructive pulmonary disease) ([place])   . DVT (deep venous thrombosis) ([place])    "I had ~ 10 in each leg" (12/20/2016)  . Essential hypertension   . Hepatitis C    "clear free over a year now"  . High  cholesterol   . Myocardial infarction ([place]) ~ 2014  . Osteoarthritis    a. hands and toes.  . Pneumonia    "several times" (12/20/2016)  . Pulmonary embolism ([place])    a. 2012 - s/p IVC filter;  b. prev on eliquis - noncompliant.  . Shortness of breath dyspnea   . Sinus headache   . Squamous cell skin cancer 07/13/2016   "foot"  . Tobacco abuse     Past Surgical History:  Procedure Laterality Date  . CARDIAC CATHETERIZATION N/A 01/04/2015   Procedure: Left Heart Cath and Coronary Angiography;  Surgeon: Burnell Blanks, MD;  Location: [facility];  Service: Cardiovascular;  Laterality: N/A;  . CORONARY ANGIOPLASTY WITH STENT PLACEMENT  10/21/13   BMS to D1  . CYST EXCISION N/A 05/10/2016   Procedure: EXCISION OF SEBACEOUS CYST UPPER BACK;  Surgeon: Donnie Mesa, MD;  Location: [facility];  Service: General;  Laterality: N/A;  . FOOT SURGERY Right ~ 06/2016   "said there was skin cancer on it"  . LEFT HEART CATHETERIZATION WITH CORONARY ANGIOGRAM N/A 10/21/2013   Procedure: LEFT HEART CATHETERIZATION WITH CORONARY ANGIOGRAM;  Surgeon: Leonie Man, MD;  Location: [facility];  Service: Cardiovascular;  Laterality: N/A;  . VENA CAVA FILTER PLACEMENT  ~ 2007-~ 2017   "1 in my neck; 2 in my wrist"    Allergies  Allergen Reactions  . No Known Allergies       Outpatient Medications Prior to Visit  Medication Sig Dispense Refill  . albuterol (PROVENTIL) (2.5 MG/3ML) 0.083% nebulizer solution Take 3 mLs (2.5 mg total) every 6 (six) hours as needed by nebulization for wheezing or shortness of breath. 75 mL 3  . fluticasone (FLONASE) 50 MCG/ACT nasal spray Place 2 sprays into both nostrils 2 (two) times daily.  2  . apixaban (ELIQUIS) 5 MG TABS tablet Take 1 tablet (5 mg total) by mouth 2 (two) times daily. 60 tablet 0  . atorvastatin (LIPITOR) 40 MG tablet Take 1 tablet (40 mg total) by mouth daily at 6 PM. 30 tablet 3  . Fluticasone-Salmeterol (ADVAIR DISKUS) 250-50 MCG/DOSE  AEPB Inhale 1 puff into the lungs 2 (two) times daily. 1 each 3  . gabapentin (NEURONTIN) 300 MG capsule Take 1 capsule (300 mg total) by mouth 2 (two) times daily. 60 capsule 3  . pantoprazole (PROTONIX) 40 MG tablet Take 1 tablet (40 mg total) by mouth daily. 30 tablet 3  . tiZANidine (ZANAFLEX) 4 MG tablet Take 1 tablet (4 mg total) by mouth every 8 (eight) hours as needed for muscle spasms. 60 tablet 1  . VENTOLIN HFA 108 (90 Base) MCG/ACT inhaler Inhale 2 puffs into the lungs every 6 (six) hours as needed for wheezing or shortness of breath. 1 Inhaler 1  . acetaminophen (TYLENOL) 325 MG tablet Take 650 mg by mouth every 6 (six) hours as needed for mild pain.    . benzonatate (TESSALON) 100 MG capsule Take 1 capsule (100 mg total) by mouth 2 (two) times daily as needed for cough. 20 capsule 0  . diclofenac sodium (VOLTAREN) 1 % GEL Apply 4 g topically 4 (four) times daily. 100 g 1  . methylPREDNISolone (MEDROL DOSEPAK) 4 MG TBPK tablet Take as directed (Patient not taking: Reported on 08/17/2017) 21 tablet 0  . predniSONE (DELTASONE) 10 MG tablet 6 tablets for 3 days, 5 tablets for 3 days, 4 tablets for 3 days, 3 tablets for 3 days, 2 tablets for 3 days, 1 tablet for 3 days then discontinue (Patient not taking: Reported on 05/15/2017) 120 tablet 0  . traMADol (ULTRAM) 50 MG tablet Take 1 tablet (50 mg total) by mouth every 12 (twelve) hours as needed. 30 tablet 0  . ELIQUIS STARTER PACK (ELIQUIS STARTER PACK) 5 MG TABS Take as directed on package: start with two-5mg  tablets twice daily for 7 days. On day 8, switch to one-5mg  tablet twice daily. 1 each 0   No facility-administered medications prior to visit.     ROS Review of Systems  Constitutional: Negative for activity change and appetite change.  HENT: Negative for sinus pressure and sore throat.   Eyes: Negative for visual disturbance.  Respiratory: Negative for cough, chest tightness and shortness of breath.   Cardiovascular: Positive  for leg swelling. Negative for chest pain.  Gastrointestinal: Negative for abdominal distention, abdominal pain, constipation and diarrhea.  Endocrine: Negative.   Genitourinary: Negative for dysuria.  Musculoskeletal: Negative for joint swelling and myalgias.  Skin: Negative for rash.  Allergic/Immunologic: Negative.   Neurological: Negative for weakness, light-headedness and numbness.  Psychiatric/Behavioral: Negative for dysphoric mood and suicidal ideas.    Objective:  BP (!) 146/94 (BP Location: Right Arm, Patient Position: Sitting, Cuff Size: Normal)   Pulse 74   Temp 98.4 F (36.9 C) (Oral)   Resp 16   Wt 198 lb (89.8 kg)   SpO2  96%   BMI 25.42 kg/m   BP/Weight 08/17/2017 06/30/2017 08/22/4130  Systolic BP 440 102 725  Diastolic BP 94 99 99  Wt. (Lbs) 198 197 198  BMI 25.42 25.29 25.42      Physical Exam  Constitutional: He is oriented to person, place, and time. He appears well-developed and well-nourished.  Neck: No JVD present.  Cardiovascular: Normal rate, normal heart sounds and intact distal pulses.  No murmur heard. Pulmonary/Chest: Effort normal and breath sounds normal. He has no wheezes. He has no rales. He exhibits no tenderness.  Abdominal: Soft. Bowel sounds are normal. He exhibits no distension and no mass. There is no tenderness.  Musculoskeletal: Normal range of motion. He exhibits edema (1+ edema of L ankle).  Neurological: He is alert and oriented to person, place, and time. He displays normal reflexes. No cranial nerve deficit or sensory deficit. He exhibits normal muscle tone. Coordination normal.  Skin: Skin is warm and dry.  Psychiatric: He has a normal mood and affect.     Assessment & Plan:   1. Gastroesophageal reflux disease without esophagitis Controlled - pantoprazole (PROTONIX) 40 MG tablet; Take 1 tablet (40 mg total) by mouth daily.  Dispense: 30 tablet; Refill: 3  2. Neuropathy Stable - gabapentin (NEURONTIN) 300 MG capsule; Take  1 capsule (300 mg total) by mouth 2 (two) times daily.  Dispense: 60 capsule; Refill: 3  3. Other emphysema ([place]) No recent exacerbation - albuterol (PROAIR HFA) 108 (90 Base) MCG/ACT inhaler; Inhale 2 puffs into the lungs every 6 (six) hours as needed for wheezing or shortness of breath.  Dispense: 1 Inhaler; Refill: 3 - Fluticasone-Salmeterol (ADVAIR DISKUS) 250-50 MCG/DOSE AEPB; Inhale 1 puff into the lungs 2 (two) times daily.  Dispense: 1 each; Refill: 3  4. History of pulmonary embolism Indefinite anticoagulation due to recurrent embolic disorders - apixaban (ELIQUIS) 5 MG TABS tablet; Take 1 tablet (5 mg total) by mouth 2 (two) times daily.  Dispense: 60 tablet; Refill: 3  5. Coronary artery disease involving native coronary artery of native heart with angina pectoris (HCC) Asymptomatic Risk factor modification - atorvastatin (LIPITOR) 40 MG tablet; Take 1 tablet (40 mg total) by mouth daily at 6 PM.  Dispense: 30 tablet; Refill: 3  6. Chronic right-sided thoracic back pain Stable - tiZANidine (ZANAFLEX) 4 MG tablet; Take 1 tablet (4 mg total) by mouth every 8 (eight) hours as needed for muscle spasms.  Dispense: 60 tablet; Refill: 1  7. Weakness of both lower extremities Uncontrolled; exam reveals normal motor strength globally Symptoms have been present for greater than 3 months Initially thought to be steroid induced myopathy however he is not improving - Ambulatory referral to Physical Therapy  8. History of recurrent deep vein thrombosis (DVT) Indefinite anticoagulation - apixaban (ELIQUIS) 5 MG TABS tablet; Take 1 tablet (5 mg total) by mouth 2 (two) times daily.  Dispense: 60 tablet; Refill: 3   Meds ordered this encounter  Medications  . albuterol (PROAIR HFA) 108 (90 Base) MCG/ACT inhaler    Sig: Inhale 2 puffs into the lungs every 6 (six) hours as needed for wheezing or shortness of breath.    Dispense:  1 Inhaler    Refill:  3  . pantoprazole (PROTONIX) 40 MG  tablet    Sig: Take 1 tablet (40 mg total) by mouth daily.    Dispense:  30 tablet    Refill:  3  . gabapentin (NEURONTIN) 300 MG capsule    Sig: Take 1  capsule (300 mg total) by mouth 2 (two) times daily.    Dispense:  60 capsule    Refill:  3  . Fluticasone-Salmeterol (ADVAIR DISKUS) 250-50 MCG/DOSE AEPB    Sig: Inhale 1 puff into the lungs 2 (two) times daily.    Dispense:  1 each    Refill:  3  . apixaban (ELIQUIS) 5 MG TABS tablet    Sig: Take 1 tablet (5 mg total) by mouth 2 (two) times daily.    Dispense:  60 tablet    Refill:  3  . atorvastatin (LIPITOR) 40 MG tablet    Sig: Take 1 tablet (40 mg total) by mouth daily at 6 PM.    Dispense:  30 tablet    Refill:  3  . cetirizine (ZYRTEC) 10 MG tablet    Sig: Take 1 tablet (10 mg total) by mouth daily.    Dispense:  30 tablet    Refill:  1  . tiZANidine (ZANAFLEX) 4 MG tablet    Sig: Take 1 tablet (4 mg total) by mouth every 8 (eight) hours as needed for muscle spasms.    Dispense:  60 tablet    Refill:  1    Follow-up: Return in about 3 months (around 11/17/2017) for follow up of chronic medical conditions.   Charlott Rakes MD

## 2017-08-17 NOTE — Progress Notes (Signed)
Follow up ankle pain and asthma.

## 2017-08-21 ENCOUNTER — Other Ambulatory Visit: Payer: Self-pay

## 2017-08-24 ENCOUNTER — Ambulatory Visit (INDEPENDENT_AMBULATORY_CARE_PROVIDER_SITE_OTHER): Payer: Medicare Other | Admitting: Podiatry

## 2017-08-24 DIAGNOSIS — M779 Enthesopathy, unspecified: Secondary | ICD-10-CM | POA: Diagnosis not present

## 2017-08-28 NOTE — Progress Notes (Signed)
Subjective: 57 year old male presents the office today for evaluation of bilateral foot, ankle pain.  She is otherwise actually much better since the injection is tenderness is greatly improved.  He states the left ankle is also improved he still gets tenderness and points on the medial aspect of the ankle.  Decreased swelling to the area that he is reported he is been wearing an ankle brace.  He states the brace does help.  He has no recent injury he reports.  No other changes. Denies any systemic complaints such as fevers, chills, nausea, vomiting. No acute changes since last appointment, and no other complaints at this time.   Objective: AAO x3, NAD DP/PT pulses palpable bilaterally, CRT less than 3 seconds There is decreased tenderness palpation on the right foot on the first metatarsal of the area the previous injection.  The left side there is still tender to palpation on the medial aspect the ankle on the course the posterior tibial tendon there is decrease edema to the area.  Overall the tendons appear to be intact.  There is no erythema or increase in warmth.  No area pinpoint bony tenderness or pain to vibratory sensation.  Ankle, subtalar joint range of motion intact.  No open lesions or pre-ulcerative lesions.  No pain with calf compression, swelling, warmth, erythema  Assessment: Tendinitis left ankle with resolved capsulitis right foot  Plan: -All treatment options discussed with the patient including all alternatives, risks, complications.  -Discussed a steroid injection on the left and he agrees understanding risks and potential complications of steroid injection to this area.  Skin was prepped with alcohol and then a mixture of 0.5 cc of Kenalog 10, 0.5 cc of Marcaine plain and 0.5 cc of lidocaine was infiltrated around the posterior tibial tendon sheath with care not to directly check into the tendon.  Continue with ankle brace and ice to the area. -Follow-up on further steroid  injection of the right foot. -Patient encouraged to call the office with any questions, concerns, change in symptoms.   Return in about 1 month (around 09/21/2017).  Trula Slade DPM

## 2017-09-17 ENCOUNTER — Telehealth: Payer: Self-pay | Admitting: Family Medicine

## 2017-09-17 NOTE — Telephone Encounter (Signed)
Error

## 2017-09-21 ENCOUNTER — Ambulatory Visit: Payer: Medicare Other | Admitting: Podiatry

## 2017-10-03 ENCOUNTER — Ambulatory Visit: Payer: Medicare Other | Attending: Family Medicine | Admitting: Rehabilitative and Restorative Service Providers"

## 2017-10-03 DIAGNOSIS — M6281 Muscle weakness (generalized): Secondary | ICD-10-CM

## 2017-10-03 DIAGNOSIS — R2689 Other abnormalities of gait and mobility: Secondary | ICD-10-CM | POA: Diagnosis not present

## 2017-10-03 NOTE — Patient Instructions (Signed)
WALKING  Walking is a great form of exercise to increase your strength, endurance and overall fitness.  A walking program can help you start slowly and gradually build endurance as you go.  Everyone's ability is different, so each person's starting point will be different.  You do not have to follow them exactly.  The are just samples. You should simply find out what's right for you and stick to that program.   In the beginning, you'll start off walking 2-3 times a day for short distances.  As you get stronger, you'll be walking further at just 1-2 times per day.  TO BEGIN: Start by walking until moderate pain develops in calf musculature (5-6/10).  For you, this may take about 6 minutes.    Do this 2 times a day as weather permits (do not walk in middle of the day of high heat).   DRINK MORE WATER.  Try to add at least 4 cups per day.

## 2017-10-04 NOTE — Therapy (Signed)
[facility] [address] [place], [place], [address] Phone: [phone]   Fax:  [phone]  Physical Therapy Evaluation  Patient Details  Name: [name] MRN: [id number] Date of Birth: 03-Jan-1961 Referring Provider: Charlott Rakes, MD   Encounter Date: 10/03/2017  PT End of Session - 10/04/17 1251    Visit Number  1    Number of Visits  8    Date for PT Re-Evaluation  12/02/17    Authorization Type  [employer] medicare and medicaid    PT Start Time  [phone]    PT Stop Time  0930    PT Time Calculation (min)  40 min    Activity Tolerance  Patient tolerated treatment well    Behavior During Therapy  [facility] for tasks assessed/performed       Past Medical History:  Diagnosis Date  . Asthma   . CAD (coronary artery disease)    a. 09/2013 NSTEMI/PCI: LM nl, LAD 40-50%, D1 100 (2.25 x 28 Vision BMS), LCX min irregs, RI 60-70, 30, RCA 40-50/50-56ms/p, EF 45-50%.  b. cath 12/2014 -occulded BMS in diag, 50% pro to mid LAD, 60% ramus, 30% RCA   . Chest pain 08/2015  . Chronic diastolic CHF (congestive heart failure) ([place])    a. 09/2014 EF 45-50% by LV gram;  b. 01/2014 Echo: EF 55-60%, Gr 1 DD.  Marland Kitchen Cocaine abuse ([place])   . COPD (chronic obstructive pulmonary disease) ([place])   . DVT (deep venous thrombosis) ([place])    "I had ~ 10 in each leg" (12/20/2016)  . Essential hypertension   . Hepatitis C    "clear free over a year now"  . High cholesterol   . Myocardial infarction ([place]) ~ 2014  . Osteoarthritis    a. hands and toes.  . Pneumonia    "several times" (12/20/2016)  . Pulmonary embolism ([place])    a. 2012 - s/p IVC filter;  b. prev on eliquis - noncompliant.  . Shortness of breath dyspnea   . Sinus headache   . Squamous cell skin cancer 07/13/2016   "foot"  . Tobacco abuse     Past Surgical History:  Procedure Laterality Date  . CARDIAC CATHETERIZATION N/A 01/04/2015   Procedure: Left Heart Cath and Coronary Angiography;   Surgeon: Burnell Blanks, MD;  Location: [facility];  Service: Cardiovascular;  Laterality: N/A;  . CORONARY ANGIOPLASTY WITH STENT PLACEMENT  10/21/13   BMS to D1  . CYST EXCISION N/A 05/10/2016   Procedure: EXCISION OF SEBACEOUS CYST UPPER BACK;  Surgeon: Donnie Mesa, MD;  Location: [facility];  Service: General;  Laterality: N/A;  . FOOT SURGERY Right ~ 06/2016   "said there was skin cancer on it"  . LEFT HEART CATHETERIZATION WITH CORONARY ANGIOGRAM N/A 10/21/2013   Procedure: LEFT HEART CATHETERIZATION WITH CORONARY ANGIOGRAM;  Surgeon: Leonie Man, MD;  Location: [facility] CATH LAB;  Service: Cardiovascular;  Laterality: N/A;  . VENA CAVA FILTER PLACEMENT  ~ 2007-~ 2017   "1 in my neck; 2 in my wrist"    There were no vitals filed for this visit.   Subjective Assessment - 10/03/17 0852    Subjective  The patient notes worsening weakness in LEs noting he cannot get up from a chair as easily and is unable to get up and down from the floor without using something to pull up on.  He also notes shortness of breath with walking x years.  The patient also  reports heaviness when walking upstairs.      Patient Stated Goals  Help with strengthening and breathing.  Being able to get u and down from chairs.    Currently in Pain?  Yes awoke with tightness in neck    Pain Score  7     Pain Location  Neck    Pain Orientation  Right    Pain Descriptors / Indicators  Aching;Sore    Pain Type  Acute pain    Pain Onset  Today    Pain Frequency  Constant    Aggravating Factors   awoke that way this morning    Pain Relieving Factors  unsure         [facility] PT Assessment - 10/03/17 0855      Assessment   Medical Diagnosis  LE weakness    Referring Provider  Charlott Rakes, MD    Onset Date/Surgical Date  08/17/17    Prior Therapy  none      Precautions   Precautions  Fall      Restrictions   Weight Bearing Restrictions  No      Balance Screen   Has the patient fallen in the past 6  months  No    Has the patient had a decrease in activity level because of a fear of falling?   No    Is the patient reluctant to leave their home because of a fear of falling?   No      Home Environment   Living Environment  Private residence    Living Arrangements  -- housemate    Type of [place] Access  Level entry    [place]  None      Prior Function   Level of [place]  On disability      Cognition   Overall Cognitive Status  Within Functional Limits for tasks assessed      Sensation   Light Touch  Appears Intact per subjective report      ROM / Strength   AROM / PROM / Strength  AROM;Strength      AROM   Overall AROM   Within functional limits for tasks performed      Strength   Overall Strength  Deficits    Overall Strength Comments  UEs 4/5 for bilat shoulder flexion, abduction, 5/5 for bilat elbow flexion.     Strength Assessment Site  Hip;Knee;Ankle    Right/Left Hip  Right;Left    Right Hip Flexion  3/5    Right Hip Extension  2+/5    Right Hip ABduction  3/5    Left Hip Flexion  3/5    Left Hip Extension  2+/5    Left Hip ABduction  3/5    Right/Left Knee  Right;Left    Right Knee Flexion  3/5    Right Knee Extension  4/5    Left Knee Flexion  3/5    Left Knee Extension  5/5    Right/Left Ankle  Right;Left    Right Ankle Dorsiflexion  4/5    Left Ankle Dorsiflexion  4/5      Ambulation/Gait   Ambulation/Gait  Yes    Ambulation/Gait Assistance  7: Independent    Assistive device  None    Gait Pattern  Within Functional Limits    Ambulation Surface  Level    Gait velocity  3.42 ft/sec    Stairs  Yes    Stairs Assistance  6: Modified independent (Device/Increase time)    Stair Management Technique  Two rails;Alternating pattern uses rails to ascend, none to descend    Number of Stairs  8      6 Minute Walk- Baseline   6 Minute Walk- Baseline  yes    BP (mmHg)  91/71 L arm,  then took in R arm (normal cuff) 117/82, HR 81    HR (bpm)  107    Modified Borg Scale for Dyspnea  0- Nothing at all    Perceived Rate of Exertion (Borg)  6-      6 Minute walk- Post Test   6 Minute Walk Post Test  yes    BP (mmHg)  107/76 left arm, 96/68 in right arm, 120 bpm    HR (bpm)  120    Modified Borg Scale for Dyspnea  0.5- Very, very slight shortness of breath    Perceived Rate of Exertion (Borg)  13- Somewhat hard      6 minute walk test results    Aerobic Endurance Distance Walked  1337    Endurance additional comments  Patient developed 5/10 bilat calf pain at 1:50, that improved by 3:00, then worsened to 7/10 by end of 6 minute walk       Standardized Balance Assessment   Standardized Balance Assessment  Berg Balance Test      Berg Balance Test   Sit to Stand  Able to stand  independently using hands    Standing Unsupported  Able to stand safely 2 minutes    Sitting with Back Unsupported but Feet Supported on Floor or Stool  Able to sit safely and securely 2 minutes    Stand to Sit  Controls descent by using hands    Transfers  Able to transfer safely, definite need of hands    Standing Unsupported with Eyes Closed  Able to stand 10 seconds safely    Standing Ubsupported with Feet Together  Able to place feet together independently and stand 1 minute safely    From Standing, Reach Forward with Outstretched Arm  Can reach confidently >25 cm (10")    From Standing Position, Pick up Object from Floor  Able to pick up shoe safely and easily    From Standing Position, Turn to Look Behind Over each Shoulder  Looks behind from both sides and weight shifts well    Turn 360 Degrees  Able to turn 360 degrees safely in 4 seconds or less    Standing Unsupported, Alternately Place Feet on Step/Stool  Able to stand independently and safely and complete 8 steps in 20 seconds    Standing Unsupported, One Foot in Front  Able to place foot tandem independently and hold 30 seconds     Standing on One Leg  Able to lift leg independently and hold equal to or more than 3 seconds    Total Score  51    Berg comment:  51/56 (more shaky in R leg stance than L)                Objective measurements completed on examination: See above findings.              PT Education - 10/04/17 1250    Education Details  home walking program    Person(s) Educated  Patient    Methods  Explanation    Comprehension  Verbalized understanding  PT Short Term Goals - 10/04/17 1253      PT SHORT TERM GOAL #1   Title  The patient will return demo HEP for LE strengthening, and general endurance activities.    Time  4    Period  Weeks    Target Date  11/02/17      PT SHORT TERM GOAL #2   Title  The patient will tolerate walking x 5 minutes with pain < or equal to 4/10.    Baseline  5/10 after 1:51    Time  4    Period  Weeks    Target Date  11/02/17      PT SHORT TERM GOAL #3   Title  The patient will improve LE strength for functional activities by moving sit>stand without UE support.    Time  4    Period  Weeks    Target Date  11/02/17      PT SHORT TERM GOAL #4   Title  The patient will improve hip extension strength to 3/5 (from 2/5).    Time  4    Period  Weeks    Target Date  11/02/17        PT Long Term Goals - 10/04/17 1256      PT LONG TERM GOAL #1   Title  The patient will verbalize understanding of community exercise resources for post d/c activities.    Time  8    Period  Weeks    Target Date  12/02/17      PT LONG TERM GOAL #2   Title  The patient will improve Berg from 51/56 to > or equal to 54/56 to demo improved high level balance.    Time  8    Period  Weeks    Target Date  12/02/17      PT LONG TERM GOAL #3   Title  The patient will ambulate for 10 minutes with LE pain < or equal to 4/10 to demo improving muscular endurance.    Time  8    Period  Weeks    Target Date  12/02/17      PT LONG TERM GOAL #4   Title  Thepatient  will negotiate 12 steps with one handrail with reciprocal pattern mod indep.    Time  8    Period  Weeks    Target Date  12/02/17      PT LONG TERM GOAL #5   Title  The patient will improve bilat hip abduction to > or equal to 4/5.    Time  8    Period  Weeks    Target Date  12/02/17             Plan - 10/04/17 1258    Clinical Impression Statement  The patient is a 57 yo male with impairments of hip weakness, pain in calf musculature with walking (?claudication), decreased balance leading to functional impairments of difficulty rising from a chair, dec'd ability to get on/off the floor, and dec'd community ambulation.    History and Personal Factors relevant to plan of care:  COPD, chronic sinusitis, recurrent DVT and PE (on anticoagulation with Eliquis), coronary artery disease (status post stent, last cardiac cath from 12/2014 revealed two-vessel CAD, medical management recommended), tobacco abuse    Clinical Presentation  Evolving    Clinical Presentation due to:  increasing muscle weakness per report, dec'd ability to get up and down from chair and worsening leg pain  with walking.    Rehab Potential  Good    PT Frequency  1x / week    PT Duration  8 weeks    PT Treatment/Interventions  ADLs/Self Care Home Management;Therapeutic exercise;Balance training;Neuromuscular re-education;Functional mobility training;Gait training;Stair training;Therapeutic activities;Patient/family education    PT Next Visit Plan  discuss home walking program, add HEP for hip strengthening, high level balance, walking for intermittent claudication?    Consulted and Agree with Plan of Care  Patient       Patient will benefit from skilled therapeutic intervention in order to improve the following deficits and impairments:  Abnormal gait, Decreased endurance, Decreased activity tolerance, Impaired flexibility, Difficulty walking, Decreased balance, Pain, Decreased strength  Visit Diagnosis: Other  abnormalities of gait and mobility  Muscle weakness (generalized)     Problem List Patient Active Problem List   Diagnosis Date Noted  . GERD (gastroesophageal reflux disease) 04/24/2017  . COPD exacerbation ([place]) 04/05/2017  . Chronic sinusitis 02/02/2017  . COPD with acute exacerbation ([place]) 12/20/2016  . History of squamous cell carcinoma 08/03/2016  . Psoriasis 08/03/2016  . Squamous cell skin cancer 07/13/2016  . Chronic anticoagulation 02/01/2016  . Malnutrition of moderate degree 06/05/2015  . Liver fibrosis 04/08/2015  . COPD (chronic obstructive pulmonary disease) ([place]) 01/01/2015  . Essential hypertension 01/01/2015  . Unstable angina ([place]) 01/01/2015  . History of coronary artery disease   . Cocaine abuse ([place]) 12/01/2014  . Pleuritic chest pain 12/01/2014  . Chronic diastolic heart failure ([place]) 12/01/2014  . QT prolongation 12/01/2014  . Chronic hepatitis C without hepatic coma ([place]) 06/18/2014  . HTN (hypertension) 04/27/2014  . Coronary artery disease involving native coronary artery of native heart without angina pectoris   . CAD (coronary artery disease) 10/21/2013  . NSTEMI (non-ST elevated myocardial infarction) ([place]) 10/21/2013  . Anemia 06/20/2013  . Regular alcohol consumption 06/18/2013  . History of DVT (deep vein thrombosis) 02/21/2013  . Thrombocytopenia ([place]) 08/09/2012  . Tobacco abuse 08/09/2012  . History of noncompliance with medical treatment 03/24/2012  . Atopic dermatitis 03/24/2012  . History of pulmonary embolism 02/10/2012    Jacquese Cassarino, PT 10/04/2017, 1:03 PM  [facility] [address] [place], [place], [address] Phone: [phone]   Fax:  [phone]  Name: [name] MRN: [id number] Date of Birth: 28-Sep-1960

## 2017-10-10 ENCOUNTER — Ambulatory Visit: Payer: Medicare Other | Admitting: Physical Therapy

## 2017-10-10 VITALS — BP 101/74 | HR 109

## 2017-10-10 DIAGNOSIS — R2689 Other abnormalities of gait and mobility: Secondary | ICD-10-CM

## 2017-10-10 DIAGNOSIS — M6281 Muscle weakness (generalized): Secondary | ICD-10-CM | POA: Diagnosis not present

## 2017-10-10 NOTE — Patient Instructions (Signed)
Access Code: [email or web address]  URL: [email or web address]  Date: 10/10/2017  Prepared by: Jamey Reas   Exercises Sit to Stand without Arm Support - 10 reps - 1 sets - 5 seconds hold - 1x daily - 5x weekly Standing Gastroc Stretch on Step with Counter Support - 10 reps - 1 sets - 5 seconds hold - 1x daily - 5x weekly

## 2017-10-10 NOTE — Therapy (Signed)
[facility] [address] [place], [place], [address] Phone: [phone]   Fax:  [phone]  Physical Therapy Treatment  Patient Details  Name: [name] MRN: [id number] Date of Birth: 1960/09/28 Referring Provider: Charlott Rakes, MD   Encounter Date: 10/10/2017  PT End of Session - 10/10/17 1458    Visit Number  2    Number of Visits  8    Date for PT Re-Evaluation  12/02/17    Authorization Type  [employer] medicare and medicaid    PT Start Time  1400    PT Stop Time  1445    PT Time Calculation (min)  45 min    Equipment Utilized During Treatment  Gait belt    Activity Tolerance  Patient tolerated treatment well    Behavior During Therapy  [facility] for tasks assessed/performed       Past Medical History:  Diagnosis Date  . Asthma   . CAD (coronary artery disease)    a. 09/2013 NSTEMI/PCI: LM nl, LAD 40-50%, D1 100 (2.25 x 28 Vision BMS), LCX min irregs, RI 60-70, 30, RCA 40-50/50-82ms/p, EF 45-50%.  b. cath 12/2014 -occulded BMS in diag, 50% pro to mid LAD, 60% ramus, 30% RCA   . Chest pain 08/2015  . Chronic diastolic CHF (congestive heart failure) ([place])    a. 09/2014 EF 45-50% by LV gram;  b. 01/2014 Echo: EF 55-60%, Gr 1 DD.  Marland Kitchen Cocaine abuse ([place])   . COPD (chronic obstructive pulmonary disease) ([place])   . DVT (deep venous thrombosis) ([place])    "I had ~ 10 in each leg" (12/20/2016)  . Essential hypertension   . Hepatitis C    "clear free over a year now"  . High cholesterol   . Myocardial infarction ([place]) ~ 2014  . Osteoarthritis    a. hands and toes.  . Pneumonia    "several times" (12/20/2016)  . Pulmonary embolism ([place])    a. 2012 - s/p IVC filter;  b. prev on eliquis - noncompliant.  . Shortness of breath dyspnea   . Sinus headache   . Squamous cell skin cancer 07/13/2016   "foot"  . Tobacco abuse     Past Surgical History:  Procedure Laterality Date  . CARDIAC CATHETERIZATION N/A 01/04/2015   Procedure: Left Heart Cath and Coronary Angiography;  Surgeon: Burnell Blanks, MD;  Location: [facility];  Service: Cardiovascular;  Laterality: N/A;  . CORONARY ANGIOPLASTY WITH STENT PLACEMENT  10/21/13   BMS to D1  . CYST EXCISION N/A 05/10/2016   Procedure: EXCISION OF SEBACEOUS CYST UPPER BACK;  Surgeon: Donnie Mesa, MD;  Location: [facility];  Service: General;  Laterality: N/A;  . FOOT SURGERY Right ~ 06/2016   "said there was skin cancer on it"  . LEFT HEART CATHETERIZATION WITH CORONARY ANGIOGRAM N/A 10/21/2013   Procedure: LEFT HEART CATHETERIZATION WITH CORONARY ANGIOGRAM;  Surgeon: Leonie Man, MD;  Location: [facility] CATH LAB;  Service: Cardiovascular;  Laterality: N/A;  . VENA CAVA FILTER PLACEMENT  ~ 2007-~ 2017   "1 in my neck; 2 in my wrist"    Vitals:   10/10/17 1402 10/10/17 1410 10/10/17 1419  BP: 109/83 106/78 101/74  Pulse: 87 100 (!) 109    Subjective Assessment - 10/10/17 1448    Subjective  Pt stated that his biggest problem is fatigue; he can only walk about 5 min max; makes it difficult for him to get in out of stores; said  he tries to do weed eating and has to stop after 3 min     Patient Stated Goals  Help with strengthening and breathing.  Being able to get u and down from chairs.    Currently in Pain?  Yes    Pain Score  4     Pain Location  Leg    Pain Orientation  Left;Right    Pain Descriptors / Indicators  Heaviness    Pain Radiating Towards  calf pain / heaviness comes with walking; it did reduce in time     Pain Onset  Today                       [facility] Adult PT Treatment/Exercise - 10/10/17 0001      Ambulation/Gait   Ambulation/Gait  Yes    Ambulation/Gait Assistance  5: Supervision    Ambulation Surface  Level  then 3% incline     Gait velocity  1.84mph 5 min x 2          There ex; sit to stand 2 x 10; focus on eccentrics; calf stretch 5 x 30 sec; treadmill  2 bouts   5 min each; 1st level 2nd 3% plane  1.0 mph   Allow UE support;      PT Education - 10/10/17 1457    Education Details  home ex's program;     Person(s) Educated  Patient    Methods  Explanation;Demonstration    Comprehension  Returned demonstration       PT Short Term Goals - 10/04/17 1253      PT SHORT TERM GOAL #1   Title  The patient will return demo HEP for LE strengthening, and general endurance activities.    Time  4    Period  Weeks    Target Date  11/02/17      PT SHORT TERM GOAL #2   Title  The patient will tolerate walking x 5 minutes with pain < or equal to 4/10.    Baseline  5/10 after 1:51    Time  4    Period  Weeks    Target Date  11/02/17      PT SHORT TERM GOAL #3   Title  The patient will improve LE strength for functional activities by moving sit>stand without UE support.    Time  4    Period  Weeks    Target Date  11/02/17      PT SHORT TERM GOAL #4   Title  The patient will improve hip extension strength to 3/5 (from 2/5).    Time  4    Period  Weeks    Target Date  11/02/17        PT Long Term Goals - 10/04/17 1256      PT LONG TERM GOAL #1   Title  The patient will verbalize understanding of community exercise resources for post d/c activities.    Time  8    Period  Weeks    Target Date  12/02/17      PT LONG TERM GOAL #2   Title  The patient will improve Berg from 51/56 to > or equal to 54/56 to demo improved high level balance.    Time  8    Period  Weeks    Target Date  12/02/17      PT LONG TERM GOAL #3   Title  The patient will ambulate for 10  minutes with LE pain < or equal to 4/10 to demo improving muscular endurance.    Time  8    Period  Weeks    Target Date  12/02/17      PT LONG TERM GOAL #4   Title  Thepatient will negotiate 12 steps with one handrail with reciprocal pattern mod indep.    Time  8    Period  Weeks    Target Date  12/02/17      PT LONG TERM GOAL #5   Title  The patient will improve bilat hip abduction to > or equal to 4/5.    Time  8     Period  Weeks    Target Date  12/02/17            Plan - 10/10/17 1459    Clinical Impression Statement  Pt had normal BP after both of the 5 minute treadmill bouts; he did need to rest afterward but didn't have any s/s of excessive fatigue; he said he had a sense of tightness in his calf but it improved as he walked; not consistent with claudication; he competed the FSST at 11 seconds; no loss of blanance and was able to walk level ground and look side to side at objects w/o any change in speed or variation of walking path; he demonstrated a normal sense of balance throughout; his calfs are tight and we addressed this wiht a home program; his LE functional strength ; as in sit to stand is poor but withi the session he imporved his abilty transfer; overall he pressents with generalized weakness and if consisntent with daily ther ex he should gain in fucntion     History and Personal Factors relevant to plan of care:  COPD, generalized weakness    Clinical Presentation  [place]    Clinical Presentation due to:  generalized weakness; he never did caridiac rehab after MI    PT Treatment/Interventions  ADLs/Self Care Home Management;Therapeutic exercise;Balance training;Neuromuscular re-education;Functional mobility training;Gait training;Stair training;Therapeutic activities;Patient/family education    PT Next Visit Plan  discuss home walking program, add HEP for hip strengthening, high level balance       Patient will benefit from skilled therapeutic intervention in order to improve the following deficits and impairments:  Abnormal gait, Decreased endurance, Decreased activity tolerance, Impaired flexibility, Difficulty walking, Decreased balance, Pain, Decreased strength  Visit Diagnosis: Other abnormalities of gait and mobility  Muscle weakness (generalized)     Problem List Patient Active Problem List   Diagnosis Date Noted  . GERD (gastroesophageal reflux disease) 04/24/2017  .  COPD exacerbation ([place]) 04/05/2017  . Chronic sinusitis 02/02/2017  . COPD with acute exacerbation ([place]) 12/20/2016  . History of squamous cell carcinoma 08/03/2016  . Psoriasis 08/03/2016  . Squamous cell skin cancer 07/13/2016  . Chronic anticoagulation 02/01/2016  . Malnutrition of moderate degree 06/05/2015  . Liver fibrosis 04/08/2015  . COPD (chronic obstructive pulmonary disease) ([place]) 01/01/2015  . Essential hypertension 01/01/2015  . Unstable angina ([place]) 01/01/2015  . History of coronary artery disease   . Cocaine abuse ([place]) 12/01/2014  . Pleuritic chest pain 12/01/2014  . Chronic diastolic heart failure ([place]) 12/01/2014  . QT prolongation 12/01/2014  . Chronic hepatitis C without hepatic coma ([place]) 06/18/2014  . HTN (hypertension) 04/27/2014  . Coronary artery disease involving native coronary artery of native heart without angina pectoris   . CAD (coronary artery disease) 10/21/2013  . NSTEMI (non-ST elevated myocardial infarction) ([place]) 10/21/2013  .  Anemia 06/20/2013  . Regular alcohol consumption 06/18/2013  . History of DVT (deep vein thrombosis) 02/21/2013  . Thrombocytopenia ([place]) 08/09/2012  . Tobacco abuse 08/09/2012  . History of noncompliance with medical treatment 03/24/2012  . Atopic dermatitis 03/24/2012  . History of pulmonary embolism 02/10/2012    Rosaura Carpenter D PT DPT  10/10/2017, 3:16 PM  [facility] [address] [place], [place], [address] Phone: [phone]   Fax:  [phone]  Name: Rodarius Kichline MRN: [id number] Date of Birth: [birth year]

## 2017-10-19 ENCOUNTER — Ambulatory Visit: Payer: Medicare Other | Admitting: Rehabilitative and Restorative Service Providers"

## 2017-10-23 ENCOUNTER — Encounter: Payer: Self-pay | Admitting: Rehabilitative and Restorative Service Providers"

## 2017-10-23 ENCOUNTER — Ambulatory Visit: Payer: Medicare Other | Admitting: Rehabilitative and Restorative Service Providers"

## 2017-10-23 DIAGNOSIS — R2689 Other abnormalities of gait and mobility: Secondary | ICD-10-CM | POA: Diagnosis not present

## 2017-10-23 DIAGNOSIS — M6281 Muscle weakness (generalized): Secondary | ICD-10-CM | POA: Diagnosis not present

## 2017-10-23 NOTE — Patient Instructions (Signed)
Access Code: [email or web address]  URL: [email or web address]  Date: 10/23/2017  Prepared by: Rudell Cobb   Exercises Sit to Stand without Arm Support - 30 reps - 1 sets - 1x daily - 5x weekly Standing Gastroc Stretch on Step with Counter Support - 5 sets - 30 seconds hold - 1x daily - 5x weekly Sidelying Hip Abduction - 10 reps - 2 sets - 1x daily - 7x weekly Supine Hamstring Stretch - 3 reps - 1 sets - 30 seconds hold - 1x daily - 7x weekly

## 2017-10-23 NOTE — Therapy (Signed)
[facility] [address] [place], [place], [address] Phone: [phone]   Fax:  [phone]  Physical Therapy Treatment  Patient Details  Name: [name] MRN: [id number] Date of Birth: 11-03-1960 Referring Provider: Charlott Rakes, MD   Encounter Date: 10/23/2017  PT End of Session - 10/23/17 0846    Visit Number  3    Number of Visits  8    Date for PT Re-Evaluation  12/02/17    Authorization Type  [employer] medicare and medicaid    PT Start Time  [phone]    PT Stop Time  0928    PT Time Calculation (min)  42 min    Equipment Utilized During Treatment  Gait belt    Activity Tolerance  Patient tolerated treatment well    Behavior During Therapy  [facility] for tasks assessed/performed       Past Medical History:  Diagnosis Date  . Asthma   . CAD (coronary artery disease)    a. 09/2013 NSTEMI/PCI: LM nl, LAD 40-50%, D1 100 (2.25 x 28 Vision BMS), LCX min irregs, RI 60-70, 30, RCA 40-50/50-12ms/p, EF 45-50%.  b. cath 12/2014 -occulded BMS in diag, 50% pro to mid LAD, 60% ramus, 30% RCA   . Chest pain 08/2015  . Chronic diastolic CHF (congestive heart failure) ([place])    a. 09/2014 EF 45-50% by LV gram;  b. 01/2014 Echo: EF 55-60%, Gr 1 DD.  Marland Kitchen Cocaine abuse ([place])   . COPD (chronic obstructive pulmonary disease) ([place])   . DVT (deep venous thrombosis) ([place])    "I had ~ 10 in each leg" (12/20/2016)  . Essential hypertension   . Hepatitis C    "clear free over a year now"  . High cholesterol   . Myocardial infarction ([place]) ~ 2014  . Osteoarthritis    a. hands and toes.  . Pneumonia    "several times" (12/20/2016)  . Pulmonary embolism ([place])    a. 2012 - s/p IVC filter;  b. prev on eliquis - noncompliant.  . Shortness of breath dyspnea   . Sinus headache   . Squamous cell skin cancer 07/13/2016   "foot"  . Tobacco abuse     Past Surgical History:  Procedure Laterality Date  . CARDIAC CATHETERIZATION N/A 01/04/2015    Procedure: Left Heart Cath and Coronary Angiography;  Surgeon: Burnell Blanks, MD;  Location: [facility];  Service: Cardiovascular;  Laterality: N/A;  . CORONARY ANGIOPLASTY WITH STENT PLACEMENT  10/21/13   BMS to D1  . CYST EXCISION N/A 05/10/2016   Procedure: EXCISION OF SEBACEOUS CYST UPPER BACK;  Surgeon: Donnie Mesa, MD;  Location: [facility];  Service: General;  Laterality: N/A;  . FOOT SURGERY Right ~ 06/2016   "said there was skin cancer on it"  . LEFT HEART CATHETERIZATION WITH CORONARY ANGIOGRAM N/A 10/21/2013   Procedure: LEFT HEART CATHETERIZATION WITH CORONARY ANGIOGRAM;  Surgeon: Leonie Man, MD;  [facility]: [facility];  Service: Cardiovascular;  Laterality: N/A;  . VENA CAVA FILTER PLACEMENT  ~ 2007-~ 2017   "1 in my neck; 2 in my wrist"    There were no vitals filed for this visit.  Subjective Assessment - 10/23/17 0849    Subjective  The patient reported that his main compalint is fatigue.  He can only do 5 minutes of activity and then has to sit and rest for 10.  "I wish they had a magic pill to give me energy."  He  reports that he has to rest after getting a shower because of fatigue.      Patient Stated Goals  Help with strengthening and breathing.  Being able to get up and down from chairs.    Currently in Pain?  No/denies                       [facility] Adult PT Treatment/Exercise - 10/23/17 0851      Ambulation/Gait   Ambulation/Gait  Yes    Ambulation/Gait Assistance  6: Modified independent (Device/Increase time)    Assistive device  None    Gait Pattern  Within Functional Limits becomes antalgic left at 5+ minutes    Ambulation Surface  Level    Gait Comments  The patient walked for 6 minutes nonstop and reported 4/10 pain after 5 minute mark.  The patient c/o L lateral thigh pain (proximal in nature).  Performed supine exercises and then walked x 4 minutes with L lateral thigh pain developing at 4 minutes.      Exercises   Exercises   Knee/Hip;Other Exercises    Other Exercises   backwards walking for hip extension x 15 feet x 6 reps; toe and heel walking near support x 10 feet x 3 reps * after exercise at counter, pulse oximeter reads HR=140 bpm, took manually and was 114 bpm.  60-80% MHR=100-130      Knee/Hip Exercises: Standing   Lateral Step Up  Right;Left;10 reps;Hand Hold: 1    Lateral Step Up Limitations  Needs intermittent UE support and occasional CGA    Forward Step Up  Right;Left;10 reps    Forward Step Up Limitations  x 10 reps doing up/up, down/down.      Knee/Hip Exercises: Seated   Marching  10 reps;Strengthening;Right;Left    Marching Limitations  needs cues for upright core      Knee/Hip Exercises: Sidelying   Hip ABduction  Strengthening;Right;Left;10 reps             PT Education - 10/23/17 0923    Education Details  provided/added 2 further HEP- hamstring stretch and sidelying hip abduction    Person(s) Educated  Patient    Methods  Explanation;Demonstration;Handout    Comprehension  Verbalized understanding;Returned demonstration       PT Short Term Goals - 10/04/17 1253      PT SHORT TERM GOAL #1   Title  The patient will return demo HEP for LE strengthening, and general endurance activities.    Time  4    Period  Weeks    Target Date  11/02/17      PT SHORT TERM GOAL #2   Title  The patient will tolerate walking x 5 minutes with pain < or equal to 4/10.    Baseline  5/10 after 1:51    Time  4    Period  Weeks    Target Date  11/02/17      PT SHORT TERM GOAL #3   Title  The patient will improve LE strength for functional activities by moving sit>stand without UE support.    Time  4    Period  Weeks    Target Date  11/02/17      PT SHORT TERM GOAL #4   Title  The patient will improve hip extension strength to 3/5 (from 2/5).    Time  4    Period  Weeks    Target Date  11/02/17  PT Long Term Goals - 10/04/17 1256      PT LONG TERM GOAL #1   Title  The  patient will verbalize understanding of community exercise resources for post d/c activities.    Time  8    Period  Weeks    Target Date  12/02/17      PT LONG TERM GOAL #2   Title  The patient will improve Berg from 51/56 to > or equal to 54/56 to demo improved high level balance.    Time  8    Period  Weeks    Target Date  12/02/17      PT LONG TERM GOAL #3   Title  The patient will ambulate for 10 minutes with LE pain < or equal to 4/10 to demo improving muscular endurance.    Time  8    Period  Weeks    Target Date  12/02/17      PT LONG TERM GOAL #4   Title  Thepatient will negotiate 12 steps with one handrail with reciprocal pattern mod indep.    Time  8    Period  Weeks    Target Date  12/02/17      PT LONG TERM GOAL #5   Title  The patient will improve bilat hip abduction to > or equal to 4/5.    Time  8    Period  Weeks    Target Date  12/02/17            Plan - 10/23/17 0931    Clinical Impression Statement  The patient is progressing with ambulation noting pain in L proximal thigh versus calf musculature today.  PT modified treatment to change positions from standing to sidelying to supine to allow for resting periods while still promoting strengthening.  Plan to continue to STGs.     PT Treatment/Interventions  ADLs/Self Care Home Management;Therapeutic exercise;Balance training;Neuromuscular re-education;Functional mobility training;Gait training;Stair training;Therapeutic activities;Patient/family education    PT Next Visit Plan  Check STGs, home walking, hip strengthening.    Consulted and Agree with Plan of Care  Patient       Patient will benefit from skilled therapeutic intervention in order to improve the following deficits and impairments:  Abnormal gait, Decreased endurance, Decreased activity tolerance, Impaired flexibility, Difficulty walking, Decreased balance, Pain, Decreased strength  Visit Diagnosis: Other abnormalities of gait and  mobility  Muscle weakness (generalized)     Problem List Patient Active Problem List   Diagnosis Date Noted  . GERD (gastroesophageal reflux disease) 04/24/2017  . COPD exacerbation ([place]) 04/05/2017  . Chronic sinusitis 02/02/2017  . COPD with acute exacerbation ([place]) 12/20/2016  . History of squamous cell carcinoma 08/03/2016  . Psoriasis 08/03/2016  . Squamous cell skin cancer 07/13/2016  . Chronic anticoagulation 02/01/2016  . Malnutrition of moderate degree 06/05/2015  . Liver fibrosis 04/08/2015  . COPD (chronic obstructive pulmonary disease) ([place]) 01/01/2015  . Essential hypertension 01/01/2015  . Unstable angina ([place]) 01/01/2015  . History of coronary artery disease   . Cocaine abuse ([place]) 12/01/2014  . Pleuritic chest pain 12/01/2014  . Chronic diastolic heart failure ([place]) 12/01/2014  . QT prolongation 12/01/2014  . Chronic hepatitis C without hepatic coma ([place]) 06/18/2014  . HTN (hypertension) 04/27/2014  . Coronary artery disease involving native coronary artery of native heart without angina pectoris   . CAD (coronary artery disease) 10/21/2013  . NSTEMI (non-ST elevated myocardial infarction) ([place]) 10/21/2013  . Anemia 06/20/2013  . Regular  alcohol consumption 06/18/2013  . History of DVT (deep vein thrombosis) 02/21/2013  . Thrombocytopenia ([place]) 08/09/2012  . Tobacco abuse 08/09/2012  . History of noncompliance with medical treatment 03/24/2012  . Atopic dermatitis 03/24/2012  . History of pulmonary embolism 02/10/2012   Taylr Meuth, PT 10/23/2017, 2:29 PM  [facility] [address] [place], [place], [address] Phone: [phone]   Fax:  [phone]  Name: [name] MRN: [id number] Date of Birth: [birth year]

## 2017-10-30 ENCOUNTER — Other Ambulatory Visit: Payer: Self-pay

## 2017-10-30 NOTE — Patient Outreach (Signed)
[facility]) Care Management  10/30/2017  [name] 18-Sep-1960 [id number]   Medication Adherence call to [name] left a message for patient to call back patient is due on Atorvastatin 40 mg. [name] is showing past due under [place].  [facility] Management Direct Dial [phone]  Fax [phone] [email or web address]

## 2017-10-31 ENCOUNTER — Ambulatory Visit: Payer: Medicare Other | Attending: Family Medicine | Admitting: Rehabilitative and Restorative Service Providers"

## 2017-10-31 DIAGNOSIS — R2689 Other abnormalities of gait and mobility: Secondary | ICD-10-CM | POA: Insufficient documentation

## 2017-10-31 DIAGNOSIS — M6281 Muscle weakness (generalized): Secondary | ICD-10-CM | POA: Insufficient documentation

## 2017-11-07 ENCOUNTER — Ambulatory Visit: Payer: Medicare Other | Admitting: Rehabilitative and Restorative Service Providers"

## 2017-11-07 ENCOUNTER — Encounter: Payer: Self-pay | Admitting: Rehabilitative and Restorative Service Providers"

## 2017-11-07 VITALS — BP 105/77 | HR 104

## 2017-11-07 DIAGNOSIS — R2689 Other abnormalities of gait and mobility: Secondary | ICD-10-CM | POA: Diagnosis not present

## 2017-11-07 DIAGNOSIS — M6281 Muscle weakness (generalized): Secondary | ICD-10-CM | POA: Diagnosis not present

## 2017-11-07 NOTE — Therapy (Addendum)
[facility] [address] [place], [place], [address] Phone: [phone]   Fax:  [phone]  Physical Therapy Treatment and Discharge Summary  Patient Details  Name: [name] MRN: [id number] Date of Birth: 12/19/1960 Referring Provider: Charlott Rakes, MD   Encounter Date: 11/07/2017  PT End of Session - 11/07/17 0856    Visit Number  4    Number of Visits  8    Date for PT Re-Evaluation  12/02/17    Authorization Type  [employer]HC medicare and medicaid    PT Start Time  [phone]    PT Stop Time  0922    PT Time Calculation (min)  28 min    Equipment Utilized During Treatment  --    Activity Tolerance  Patient tolerated treatment well    Behavior During Therapy  [facility] for tasks assessed/performed       Past Medical History:  Diagnosis Date  . Asthma   . CAD (coronary artery disease)    a. 09/2013 NSTEMI/PCI: LM nl, LAD 40-50%, D1 100 (2.25 x 28 Vision BMS), LCX min irregs, RI 60-70, 30, RCA 40-50/50-25m/p, EF 45-50%.  b. cath 12/2014 -occulded BMS in diag, 50% pro to mid LAD, 60% ramus, 30% RCA   . Chest pain 08/2015  . Chronic diastolic CHF (congestive heart failure) ([place]    a. 09/2014 EF 45-50% by LV gram;  b. 01/2014 Echo: EF 55-60%, Gr 1 DD.  .Marland KitchenCocaine abuse ([place]   . COPD (chronic obstructive pulmonary disease) ([place]   . DVT (deep venous thrombosis) ([place]    "I had ~ 10 in each leg" (12/20/2016)  . Essential hypertension   . Hepatitis C    "clear free over a year now"  . High cholesterol   . Myocardial infarction ([place] ~ 2014  . Osteoarthritis    a. hands and toes.  . Pneumonia    "several times" (12/20/2016)  . Pulmonary embolism ([place]    a. 2012 - s/p IVC filter;  b. prev on eliquis - noncompliant.  . Shortness of breath dyspnea   . Sinus headache   . Squamous cell skin cancer 07/13/2016   "foot"  . Tobacco abuse     Past Surgical History:  Procedure Laterality Date  . CARDIAC CATHETERIZATION N/A  01/04/2015   Procedure: Left Heart Cath and Coronary Angiography;  Surgeon: CBurnell Blanks MD;  [facility];  Service: Cardiovascular;  Laterality: N/A;  . CORONARY ANGIOPLASTY WITH STENT PLACEMENT  10/21/13   BMS to D1  . CYST EXCISION N/A 05/10/2016   Procedure: EXCISION OF SEBACEOUS CYST UPPER BACK;  Surgeon: MDonnie Mesa MD;  Location: [facility]  Service: General;  Laterality: N/A;  . FOOT SURGERY Right ~ 06/2016   "said there was skin cancer on it"  . LEFT HEART CATHETERIZATION WITH CORONARY ANGIOGRAM N/A 10/21/2013   Procedure: LEFT HEART CATHETERIZATION WITH CORONARY ANGIOGRAM;  Surgeon: DLeonie Man MD;  [facility];  Service: Cardiovascular;  Laterality: N/A;  . VENA CAVA FILTER PLACEMENT  ~ 2007-~ 2017   "1 in my neck; 2 in my wrist"    Vitals:   11/07/17 0855 11/07/17 0856 11/07/17 0907  BP: 123/79 118/89 105/77  Pulse: 89  (!) 104    Subjective Assessment - 11/07/17 0855    Subjective  "I almost fell out at the grocery store yesterday."  Patient notes lightheaded, weak sensation when walking around the store.   It was mid-day.  He notes he walked in feeling good and then began sweating and had extreme fatgiue.  He notes he left the store early and had to sit down at home to rest.  "If I don't eat, I feel real weak.  But when I do eat, I still feel weak."    Patient thinks he is getting stronger, but plans to make an appt with his doctor.      Patient Stated Goals  Help with strengthening and breathing.  Being able to get up and down from chairs.    Currently in Pain?  No/denies   stiffness after sitting                      [facility] Adult PT Treatment/Exercise - 11/07/17 0906      Ambulation/Gait   Ambulation/Gait  Yes    Ambulation/Gait Assistance  6: Modified independent (Device/Increase time)    Ambulation Distance (Feet)  500 Feet    Assistive device  None    Gait Pattern  Within Functional Limits    Ambulation Surface   Level    Gait Comments  The patient ambulates for 4.5 minutes nonstop.  At 2 minutes he begins to c/o bilateral hip pain that decreases on the right side as it '"stretches out".  The left side increases with continued gait.  He reported lightheadedness with ambulation.  "I just feel lazy."  He also reports that he hasn't eaten breakfast this morning.    The patient notes that yesterday the episode of sweating and weakness occurred after having a meal.        Self-Care   Self-Care  Other Self-Care Comments    Other Self-Care Comments   PT recommended patient follow-up with his doctor due to reports of lightheaded sesnation experienced yesterday accompanied by excessive sweating and sensation he could pass out.  Patient notes he does not alway eat regular meals and did not have breakfast this morning.  Due to lightheaded sensation, PT recommended we end session a few minutes early.  We discussed healthy habits.               PT Short Term Goals - 11/07/17 0900      PT SHORT TERM GOAL #1   Title  The patient will return demo HEP for LE strengthening, and general endurance activities.    Time  4    Period  Weeks    Status  On-going      PT SHORT TERM GOAL #2   Title  The patient will tolerate walking x 5 minutes with pain < or equal to 4/10.    Baseline  5/10 after 1:51; patient notes 4/10 with walking x 4.5 minutes    Time  4    Period  Weeks    Status  Not Met      PT SHORT TERM GOAL #3   Title  The patient will improve LE strength for functional activities by moving sit>stand without UE support.    Baseline  Is able to perform, but compensates with far lean forward    Time  4    Period  Weeks    Status  Partially Met      PT SHORT TERM GOAL #4   Title  The patient will improve hip extension strength to 3/5 (from 2/5).    Time  4    Period  Weeks    Status  On-going        PT  Long Term Goals - 10/04/17 1256      PT LONG TERM GOAL #1   Title  The patient will  verbalize understanding of community exercise resources for post d/c activities.    Time  8    Period  Weeks    Target Date  12/02/17      PT LONG TERM GOAL #2   Title  The patient will improve Berg from 51/56 to > or equal to 54/56 to demo improved high level balance.    Time  8    Period  Weeks    Target Date  12/02/17      PT LONG TERM GOAL #3   Title  The patient will ambulate for 10 minutes with LE pain < or equal to 4/10 to demo improving muscular endurance.    Time  8    Period  Weeks    Target Date  12/02/17      PT LONG TERM GOAL #4   Title  Thepatient will negotiate 12 steps with one handrail with reciprocal pattern mod indep.    Time  8    Period  Weeks    Target Date  12/02/17      PT LONG TERM GOAL #5   Title  The patient will improve bilat hip abduction to > or equal to 4/5.    Time  8    Period  Weeks    Target Date  12/02/17            Plan - 11/07/17 0926    Clinical Impression Statement  Today's session ended early due to reports of lightheadedness today and c/o diaphoresis and lightheadedness yesterday at grocery store.  PT recommended he consult his physician to further discuss and also discussed healthy habits as he does note irregularity in his meals.     Patient weak today per report.  Plan to finish checking STGs at next session due to patient not feeling well today.    PT Treatment/Interventions  ADLs/Self Care Home Management;Therapeutic exercise;Balance training;Neuromuscular re-education;Functional mobility training;Gait training;Stair training;Therapeutic activities;Patient/family education    PT Next Visit Plan  Finish checking STGS, check LTGS and HEP.    Consulted and Agree with Plan of Care  Patient       Patient will benefit from skilled therapeutic intervention in order to improve the following deficits and impairments:  Abnormal gait, Decreased endurance, Decreased activity tolerance, Impaired flexibility, Difficulty walking, Decreased  balance, Pain, Decreased strength  Visit Diagnosis: Other abnormalities of gait and mobility  Muscle weakness (generalized)    PHYSICAL THERAPY DISCHARGE SUMMARY  Visits from Start of Care: 4  Current functional level related to goals / functional outcomes: See above   Remaining deficits: *Patient did not return for further PT visits.   Education / Equipment: Home program  Plan: Patient agrees to discharge.  Patient goals were not met. Patient is being discharged due to not returning since the last visit.  ?????          ADDENDUM ON 05/16/2018 BY Lucella Pommier, PT    Problem List Patient Active Problem List   Diagnosis Date Noted  . GERD (gastroesophageal reflux disease) 04/24/2017  . COPD exacerbation ([place]) 04/05/2017  . Chronic sinusitis 02/02/2017  . COPD with acute exacerbation ([place]) 12/20/2016  . History of squamous cell carcinoma 08/03/2016  . Psoriasis 08/03/2016  . Squamous cell skin cancer 07/13/2016  . Chronic anticoagulation 02/01/2016  . Malnutrition of moderate degree 06/05/2015  . Liver fibrosis 04/08/2015  .  COPD (chronic obstructive pulmonary disease) ([place]) 01/01/2015  . Essential hypertension 01/01/2015  . Unstable angina ([place]) 01/01/2015  . History of coronary artery disease   . Cocaine abuse ([place]) 12/01/2014  . Pleuritic chest pain 12/01/2014  . Chronic diastolic heart failure ([place]) 12/01/2014  . QT prolongation 12/01/2014  . Chronic hepatitis C without hepatic coma ([place]) 06/18/2014  . HTN (hypertension) 04/27/2014  . Coronary artery disease involving native coronary artery of native heart without angina pectoris   . CAD (coronary artery disease) 10/21/2013  . NSTEMI (non-ST elevated myocardial infarction) ([place]) 10/21/2013  . Anemia 06/20/2013  . Regular alcohol consumption 06/18/2013  . History of DVT (deep vein thrombosis) 02/21/2013  . Thrombocytopenia ([place]) 08/09/2012  . Tobacco abuse 08/09/2012  . History of noncompliance with  medical treatment 03/24/2012  . Atopic dermatitis 03/24/2012  . History of pulmonary embolism 02/10/2012    Monifa Blanchette, PT 11/07/2017, 9:28 AM  [facility] [address] [place], [place], [address] Phone: [phone]   Fax:  [phone]  Name: [name] MRN: [id number] Date of Birth: [birth year]

## 2017-11-15 DIAGNOSIS — L738 Other specified follicular disorders: Secondary | ICD-10-CM | POA: Diagnosis not present

## 2017-11-15 DIAGNOSIS — Z85828 Personal history of other malignant neoplasm of skin: Secondary | ICD-10-CM | POA: Diagnosis not present

## 2017-11-19 ENCOUNTER — Ambulatory Visit: Payer: Medicare Other | Admitting: Rehabilitative and Restorative Service Providers"

## 2017-11-20 ENCOUNTER — Ambulatory Visit: Payer: Medicare Other | Attending: Family Medicine | Admitting: Family Medicine

## 2017-11-20 ENCOUNTER — Encounter: Payer: Self-pay | Admitting: Family Medicine

## 2017-11-20 VITALS — BP 114/74 | HR 83 | Temp 98.1°F | Ht 74.0 in | Wt 191.2 lb

## 2017-11-20 DIAGNOSIS — J438 Other emphysema: Secondary | ICD-10-CM | POA: Diagnosis not present

## 2017-11-20 DIAGNOSIS — G629 Polyneuropathy, unspecified: Secondary | ICD-10-CM | POA: Insufficient documentation

## 2017-11-20 DIAGNOSIS — I25119 Atherosclerotic heart disease of native coronary artery with unspecified angina pectoris: Secondary | ICD-10-CM

## 2017-11-20 DIAGNOSIS — I110 Hypertensive heart disease with heart failure: Secondary | ICD-10-CM | POA: Insufficient documentation

## 2017-11-20 DIAGNOSIS — I252 Old myocardial infarction: Secondary | ICD-10-CM | POA: Insufficient documentation

## 2017-11-20 DIAGNOSIS — F17210 Nicotine dependence, cigarettes, uncomplicated: Secondary | ICD-10-CM | POA: Diagnosis not present

## 2017-11-20 DIAGNOSIS — Z720 Tobacco use: Secondary | ICD-10-CM

## 2017-11-20 DIAGNOSIS — Z955 Presence of coronary angioplasty implant and graft: Secondary | ICD-10-CM | POA: Diagnosis not present

## 2017-11-20 DIAGNOSIS — K219 Gastro-esophageal reflux disease without esophagitis: Secondary | ICD-10-CM | POA: Insufficient documentation

## 2017-11-20 DIAGNOSIS — I5032 Chronic diastolic (congestive) heart failure: Secondary | ICD-10-CM | POA: Insufficient documentation

## 2017-11-20 DIAGNOSIS — Z86718 Personal history of other venous thrombosis and embolism: Secondary | ICD-10-CM | POA: Diagnosis not present

## 2017-11-20 DIAGNOSIS — R5383 Other fatigue: Secondary | ICD-10-CM

## 2017-11-20 DIAGNOSIS — Z79899 Other long term (current) drug therapy: Secondary | ICD-10-CM | POA: Insufficient documentation

## 2017-11-20 DIAGNOSIS — I2510 Atherosclerotic heart disease of native coronary artery without angina pectoris: Secondary | ICD-10-CM | POA: Diagnosis not present

## 2017-11-20 DIAGNOSIS — Z86711 Personal history of pulmonary embolism: Secondary | ICD-10-CM | POA: Diagnosis not present

## 2017-11-20 DIAGNOSIS — J449 Chronic obstructive pulmonary disease, unspecified: Secondary | ICD-10-CM | POA: Diagnosis not present

## 2017-11-20 DIAGNOSIS — B1920 Unspecified viral hepatitis C without hepatic coma: Secondary | ICD-10-CM | POA: Insufficient documentation

## 2017-11-20 DIAGNOSIS — Z7901 Long term (current) use of anticoagulants: Secondary | ICD-10-CM | POA: Diagnosis not present

## 2017-11-20 DIAGNOSIS — R0602 Shortness of breath: Secondary | ICD-10-CM | POA: Diagnosis not present

## 2017-11-20 DIAGNOSIS — E7800 Pure hypercholesterolemia, unspecified: Secondary | ICD-10-CM | POA: Diagnosis not present

## 2017-11-20 MED ORDER — ALBUTEROL SULFATE HFA 108 (90 BASE) MCG/ACT IN AERS: 2.0000 | INHALATION_SPRAY | Freq: Four times a day (QID) | RESPIRATORY_TRACT | 3 refills | Status: DC | PRN

## 2017-11-20 MED ORDER — FLUTICASONE-SALMETEROL 250-50 MCG/DOSE IN AEPB: 1.0000 | INHALATION_SPRAY | Freq: Two times a day (BID) | RESPIRATORY_TRACT | 3 refills | Status: DC

## 2017-11-20 MED ORDER — APIXABAN 5 MG PO TABS: 5.0000 mg | ORAL_TABLET | Freq: Two times a day (BID) | ORAL | 3 refills | Status: DC

## 2017-11-20 MED ORDER — PANTOPRAZOLE SODIUM 40 MG PO TBEC: 40.0000 mg | DELAYED_RELEASE_TABLET | Freq: Every day | ORAL | 3 refills | Status: DC

## 2017-11-20 MED ORDER — GABAPENTIN 300 MG PO CAPS: 300.0000 mg | ORAL_CAPSULE | Freq: Two times a day (BID) | ORAL | 3 refills | Status: DC

## 2017-11-20 MED ORDER — ATORVASTATIN CALCIUM 40 MG PO TABS: 40.0000 mg | ORAL_TABLET | Freq: Every day | ORAL | 3 refills | Status: DC

## 2017-11-20 NOTE — Progress Notes (Signed)
Subjective:  Patient ID: [name], male    DOB: Aug 07, 1960  Age: 57 y.o. MRN: [id number]  CC: Hypertension   HPI [name] is a 57 year old male with a history of COPD, chronic sinusitis, recurrent DVT and PE (on anticoagulation with Eliquis), coronary artery disease (status post stent, last cardiac cath from 12/2014 revealed two-vessel CAD, medical management recommended), tobacco abuse who presents today for follow-up visit. He had complained of weakness in his lower extremities and has been undergoing physical therapy with significant improvement in symptoms and muscle strength is back to normal. Today he complains of shortness of breath with mild exertion even when he takes a shower he has to sit.  He has had episodes of lightheadedness followed by sweating and almost passed out at the grocery store on one occasion.  Denies chest pain, pedal edema, orthopnea. He continues to smoke about 5 cigarettes/day and has smoked for the last 42 years. Doing well on his Eliquis with no frequent bruising; also tolerating his antihypertensive with no adverse effects from his medication.  Past Medical History:  Diagnosis Date  . Asthma   . CAD (coronary artery disease)    a. 09/2013 NSTEMI/PCI: LM nl, LAD 40-50%, D1 100 (2.25 x 28 Vision BMS), LCX min irregs, RI 60-70, 30, RCA 40-50/50-62ms/p, EF 45-50%.  b. cath 12/2014 -occulded BMS in diag, 50% pro to mid LAD, 60% ramus, 30% RCA   . Chest pain 08/2015  . Chronic diastolic CHF (congestive heart failure) ([place])    a. 09/2014 EF 45-50% by LV gram;  b. 01/2014 Echo: EF 55-60%, Gr 1 DD.  Marland Kitchen Cocaine abuse ([place])   . COPD (chronic obstructive pulmonary disease) ([place])   . DVT (deep venous thrombosis) ([place])    "I had ~ 10 in each leg" (12/20/2016)  . Essential hypertension   . Hepatitis C    "clear free over a year now"  . High cholesterol   . Myocardial infarction ([place]) ~ 2014  . Osteoarthritis    a. hands and toes.  . Pneumonia    "several  times" (12/20/2016)  . Pulmonary embolism ([place])    a. 2012 - s/p IVC filter;  b. prev on eliquis - noncompliant.  . Shortness of breath dyspnea   . Sinus headache   . Squamous cell skin cancer 07/13/2016   "foot"  . Tobacco abuse     Past Surgical History:  Procedure Laterality Date  . CARDIAC CATHETERIZATION N/A 01/04/2015   Procedure: Left Heart Cath and Coronary Angiography;  Surgeon: Burnell Blanks, MD;  Location: [facility];  Service: Cardiovascular;  Laterality: N/A;  . CORONARY ANGIOPLASTY WITH STENT PLACEMENT  10/21/13   BMS to D1  . CYST EXCISION N/A 05/10/2016   Procedure: EXCISION OF SEBACEOUS CYST UPPER BACK;  Surgeon: Donnie Mesa, MD;  Location: [facility];  Service: General;  Laterality: N/A;  . FOOT SURGERY Right ~ 06/2016   "said there was skin cancer on it"  . LEFT HEART CATHETERIZATION WITH CORONARY ANGIOGRAM N/A 10/21/2013   Procedure: LEFT HEART CATHETERIZATION WITH CORONARY ANGIOGRAM;  Surgeon: Leonie Man, MD;  Location: [facility];  Service: Cardiovascular;  Laterality: N/A;  . VENA CAVA FILTER PLACEMENT  ~ 2007-~ 2017   "1 in my neck; 2 in my wrist"    Allergies  Allergen Reactions  . No Known Allergies      Outpatient Medications Prior to Visit  Medication Sig Dispense Refill  . albuterol (PROVENTIL) (2.5 MG/3ML) 0.083%  nebulizer solution Take 3 mLs (2.5 mg total) every 6 (six) hours as needed by nebulization for wheezing or shortness of breath. 75 mL 3  . fluticasone (FLONASE) 50 MCG/ACT nasal spray Place 2 sprays into both nostrils 2 (two) times daily.  2  . tiZANidine (ZANAFLEX) 4 MG tablet Take 1 tablet (4 mg total) by mouth every 8 (eight) hours as needed for muscle spasms. 60 tablet 1  . albuterol (PROAIR HFA) 108 (90 Base) MCG/ACT inhaler Inhale 2 puffs into the lungs every 6 (six) hours as needed for wheezing or shortness of breath. 1 Inhaler 3  . apixaban (ELIQUIS) 5 MG TABS tablet Take 1 tablet (5 mg total) by mouth 2 (two) times  daily. 60 tablet 3  . atorvastatin (LIPITOR) 40 MG tablet Take 1 tablet (40 mg total) by mouth daily at 6 PM. 30 tablet 3  . Fluticasone-Salmeterol (ADVAIR DISKUS) 250-50 MCG/DOSE AEPB Inhale 1 puff into the lungs 2 (two) times daily. 1 each 3  . gabapentin (NEURONTIN) 300 MG capsule Take 1 capsule (300 mg total) by mouth 2 (two) times daily. 60 capsule 3  . pantoprazole (PROTONIX) 40 MG tablet Take 1 tablet (40 mg total) by mouth daily. 30 tablet 3  . acetaminophen (TYLENOL) 325 MG tablet Take 650 mg by mouth every 6 (six) hours as needed for mild pain.    Marland Kitchen diclofenac sodium (VOLTAREN) 1 % GEL Apply 4 g topically 4 (four) times daily. (Patient not taking: Reported on 10/03/2017) 100 g 1  . traMADol (ULTRAM) 50 MG tablet Take 1 tablet (50 mg total) by mouth every 12 (twelve) hours as needed. (Patient not taking: Reported on 10/03/2017) 30 tablet 0  . benzonatate (TESSALON) 100 MG capsule Take 1 capsule (100 mg total) by mouth 2 (two) times daily as needed for cough. (Patient not taking: Reported on 10/03/2017) 20 capsule 0  . cetirizine (ZYRTEC) 10 MG tablet Take 1 tablet (10 mg total) by mouth daily. (Patient not taking: Reported on 10/03/2017) 30 tablet 1  . methylPREDNISolone (MEDROL DOSEPAK) 4 MG TBPK tablet Take as directed (Patient not taking: Reported on 08/17/2017) 21 tablet 0  . predniSONE (DELTASONE) 10 MG tablet 6 tablets for 3 days, 5 tablets for 3 days, 4 tablets for 3 days, 3 tablets for 3 days, 2 tablets for 3 days, 1 tablet for 3 days then discontinue (Patient not taking: Reported on 05/15/2017) 120 tablet 0   No facility-administered medications prior to visit.     ROS Review of Systems  Constitutional: Positive for fatigue. Negative for activity change and appetite change.  HENT: Negative for sinus pressure and sore throat.   Eyes: Negative for visual disturbance.  Respiratory: Positive for shortness of breath. Negative for cough and chest tightness.   Cardiovascular: Negative for  chest pain and leg swelling.  Gastrointestinal: Negative for abdominal distention, abdominal pain, constipation and diarrhea.  Endocrine: Negative.   Genitourinary: Negative for dysuria.  Musculoskeletal: Negative for joint swelling and myalgias.  Skin: Negative for rash.  Allergic/Immunologic: Negative.   Neurological: Negative for weakness, light-headedness and numbness.  Psychiatric/Behavioral: Negative for dysphoric mood and suicidal ideas.    Objective:  BP 114/74   Pulse 83   Temp 98.1 F (36.7 C) (Oral)   Ht 6\' 2"  (1.88 m)   Wt 191 lb 3.2 oz (86.7 kg)   SpO2 97%   BMI 24.55 kg/m   BP/Weight 11/20/2017 11/07/2017 07/03/8117  Systolic BP 147 829 562  Diastolic BP 74 77 74  Wt. (Lbs) 191.2 - -  BMI 24.55 - -      Physical Exam  Constitutional: He is oriented to person, place, and time. He appears well-developed and well-nourished.  Neck: No JVD present.  Cardiovascular: Normal rate, normal heart sounds and intact distal pulses.  No murmur heard. Pulmonary/Chest: Effort normal and breath sounds normal. He has no wheezes. He has no rales. He exhibits no tenderness.  Abdominal: Soft. Bowel sounds are normal. He exhibits no distension and no mass. There is no tenderness.  Musculoskeletal: Normal range of motion.  Neurological: He is alert and oriented to person, place, and time.  Skin: Skin is warm and dry.  Psychiatric: He has a normal mood and affect.     CMP Latest Ref Rng & Units 06/30/2017 04/10/2017 04/08/2017  Glucose 65 - 99 mg/dL 115(H) 99 233(H)  BUN 6 - 20 mg/dL 7 26(H) 20  Creatinine 0.61 - 1.24 mg/dL 0.69 0.88 0.83  Sodium 135 - 145 mmol/L 137 137 137  Potassium 3.5 - 5.1 mmol/L 3.6 4.0 4.3  Chloride 101 - 111 mmol/L 101 105 109  CO2 22 - 32 mmol/L 25 23 19(L)  Calcium 8.9 - 10.3 mg/dL 9.3 8.5(L) 9.2  Total Protein 6.0 - 8.5 g/dL - - -  Total Bilirubin 0.0 - 1.2 mg/dL - - -  Alkaline Phos 39 - 117 IU/L - - -  AST 0 - 40 IU/L - - -  ALT 0 - 44 IU/L - - -     Lipid Panel     Component Value Date/Time   CHOL 206 (H) 08/03/2016 1244   TRIG 98 08/03/2016 1244   HDL 57 08/03/2016 1244   CHOLHDL 3.6 08/03/2016 1244   CHOLHDL 3.1 01/01/2015 0208   VLDL 12 01/01/2015 0208   LDLCALC 129 (H) 08/03/2016 1244     Assessment & Plan:   1. Gastroesophageal reflux disease without esophagitis Stable - pantoprazole (PROTONIX) 40 MG tablet; Take 1 tablet (40 mg total) by mouth daily.  Dispense: 30 tablet; Refill: 3  2. Neuropathy Controlled - gabapentin (NEURONTIN) 300 MG capsule; Take 1 capsule (300 mg total) by mouth 2 (two) times daily.  Dispense: 60 capsule; Refill: 3  3. Other emphysema ([place]) He continues to smoke and has been advised to work on quitting as he continues to be dyspneic with his inhalers If cardiac work-up is negative I will consider adding Spiriva to regimen - Fluticasone-Salmeterol (ADVAIR DISKUS) 250-50 MCG/DOSE AEPB; Inhale 1 puff into the lungs 2 (two) times daily.  Dispense: 1 each; Refill: 3 - albuterol (PROAIR HFA) 108 (90 Base) MCG/ACT inhaler; Inhale 2 puffs into the lungs every 6 (six) hours as needed for wheezing or shortness of breath.  Dispense: 1 Inhaler; Refill: 3  4. Coronary artery disease involving native coronary artery of native heart with angina pectoris (HCC) No angina Risk factor modification - ECHOCARDIOGRAM COMPLETE; Future - atorvastatin (LIPITOR) 40 MG tablet; Take 1 tablet (40 mg total) by mouth daily at 6 PM.  Dispense: 30 tablet; Refill: 3  5. History of pulmonary embolism On lifelong anticoagulation due to recurrent thrombotic events - apixaban (ELIQUIS) 5 MG TABS tablet; Take 1 tablet (5 mg total) by mouth 2 (two) times daily.  Dispense: 60 tablet; Refill: 3  6. History of recurrent deep vein thrombosis (DVT) Will remain on lifelong anticoagulation - apixaban (ELIQUIS) 5 MG TABS tablet; Take 1 tablet (5 mg total) by mouth 2 (two) times daily.  Dispense: 60 tablet; Refill: 3  7. Tobacco  abuse Discussed cessation - CT CHEST LUNG CA SCREEN LOW DOSE W/O CM; Future  8. Shortness of breath We will need to evaluate cardiac function - Brain natriuretic peptide - ECHOCARDIOGRAM COMPLETE; Future  9. Other fatigue - VITAMIN D 25 Hydroxy (Vit-D Deficiency, Fractures)   Meds ordered this encounter  Medications  . pantoprazole (PROTONIX) 40 MG tablet    Sig: Take 1 tablet (40 mg total) by mouth daily.    Dispense:  30 tablet    Refill:  3  . gabapentin (NEURONTIN) 300 MG capsule    Sig: Take 1 capsule (300 mg total) by mouth 2 (two) times daily.    Dispense:  60 capsule    Refill:  3  . Fluticasone-Salmeterol (ADVAIR DISKUS) 250-50 MCG/DOSE AEPB    Sig: Inhale 1 puff into the lungs 2 (two) times daily.    Dispense:  1 each    Refill:  3  . atorvastatin (LIPITOR) 40 MG tablet    Sig: Take 1 tablet (40 mg total) by mouth daily at 6 PM.    Dispense:  30 tablet    Refill:  3  . apixaban (ELIQUIS) 5 MG TABS tablet    Sig: Take 1 tablet (5 mg total) by mouth 2 (two) times daily.    Dispense:  60 tablet    Refill:  3  . albuterol (PROAIR HFA) 108 (90 Base) MCG/ACT inhaler    Sig: Inhale 2 puffs into the lungs every 6 (six) hours as needed for wheezing or shortness of breath.    Dispense:  1 Inhaler    Refill:  3    Follow-up: Return in about 3 months (around 02/20/2018) for Follow-up of chronic medical conditions.   Charlott Rakes MD

## 2017-11-20 NOTE — Patient Instructions (Signed)
Steps to Quit Smoking Smoking tobacco can be bad for your health. It can also affect almost every organ in your body. Smoking puts you and people around you at risk for many serious long-lasting (chronic) diseases. Quitting smoking is hard, but it is one of the best things that you can do for your health. It is never too late to quit. What are the benefits of quitting smoking? When you quit smoking, you lower your risk for getting serious diseases and conditions. They can include:  Lung cancer or lung disease.  Heart disease.  Stroke.  Heart attack.  Not being able to have children (infertility).  Weak bones (osteoporosis) and broken bones (fractures).  If you have coughing, wheezing, and shortness of breath, those symptoms may get better when you quit. You may also get sick less often. If you are pregnant, quitting smoking can help to lower your chances of having a baby of low birth weight. What can I do to help me quit smoking? Talk with your doctor about what can help you quit smoking. Some things you can do (strategies) include:  Quitting smoking totally, instead of slowly cutting back how much you smoke over a period of time.  Going to in-person counseling. You are more likely to quit if you go to many counseling sessions.  Using resources and support systems, such as: ? Online chats with a counselor. ? Phone quitlines. ? Printed self-help materials. ? Support groups or group counseling. ? Text messaging programs. ? Mobile phone apps or applications.  Taking medicines. Some of these medicines may have nicotine in them. If you are pregnant or breastfeeding, do not take any medicines to quit smoking unless your doctor says it is okay. Talk with your doctor about counseling or other things that can help you.  Talk with your doctor about using more than one strategy at the same time, such as taking medicines while you are also going to in-person counseling. This can help make  quitting easier. What things can I do to make it easier to quit? Quitting smoking might feel very hard at first, but there is a lot that you can do to make it easier. Take these steps:  Talk to your family and friends. Ask them to support and encourage you.  Call phone quitlines, reach out to support groups, or work with a counselor.  Ask people who smoke to not smoke around you.  Avoid places that make you want (trigger) to smoke, such as: ? Bars. ? Parties. ? Smoke-break areas at work.  Spend time with people who do not smoke.  Lower the stress in your life. Stress can make you want to smoke. Try these things to help your stress: ? Getting regular exercise. ? Deep-breathing exercises. ? Yoga. ? Meditating. ? Doing a body scan. To do this, close your eyes, focus on one area of your body at a time from head to toe, and notice which parts of your body are tense. Try to relax the muscles in those areas.  Download or buy apps on your mobile phone or tablet that can help you stick to your quit plan. There are many free apps, such as QuitGuide from the CDC (Centers for Disease Control and Prevention). You can find more support from smokefree.gov and other websites.  This information is not intended to replace advice given to you by your health care provider. Make sure you discuss any questions you have with your health care provider. Document Released: 01/07/2009 Document   Revised: 11/09/2015 Document Reviewed: 07/28/2014 Elsevier Interactive Patient Education  2018 Elsevier Inc.  

## 2017-11-20 NOTE — Progress Notes (Signed)
Patient feels weak and has no energy.

## 2017-11-21 ENCOUNTER — Encounter: Payer: Self-pay | Admitting: Family Medicine

## 2017-11-21 LAB — VITAMIN D 25 HYDROXY (VIT D DEFICIENCY, FRACTURES): Vit D, 25-Hydroxy: 16.3 ng/mL — ABNORMAL LOW (ref 30.0–100.0)

## 2017-11-21 LAB — BRAIN NATRIURETIC PEPTIDE: BNP: 63.5 pg/mL (ref 0.0–100.0)

## 2017-11-22 ENCOUNTER — Telehealth: Payer: Self-pay

## 2017-11-22 ENCOUNTER — Other Ambulatory Visit: Payer: Self-pay | Admitting: Family Medicine

## 2017-11-22 MED ORDER — ERGOCALCIFEROL 1.25 MG (50000 UT) PO CAPS: 50000.0000 [IU] | ORAL_CAPSULE | ORAL | 1 refills | Status: DC

## 2017-11-22 NOTE — Telephone Encounter (Signed)
Patient was called and informed of lab results and medication being sent to pharmacy. 

## 2017-11-22 NOTE — Telephone Encounter (Signed)
-----   Message from Charlott Rakes, MD sent at 11/22/2017  8:47 AM EDT ----- Labs reveal vitamin D deficiency which can explain his fatigue.  His smoking and COPD could also be contributing to dyspnea.  I have sent a prescription for Drisdol to his pharmacy.  Blood work does not point towards heart failure.

## 2017-11-28 ENCOUNTER — Ambulatory Visit (HOSPITAL_COMMUNITY): Admission: RE | Admit: 2017-11-28 | Payer: Medicare Other | Source: Ambulatory Visit

## 2017-11-29 ENCOUNTER — Ambulatory Visit (HOSPITAL_COMMUNITY)
Admission: RE | Admit: 2017-11-29 | Discharge: 2017-11-29 | Disposition: A | Discharge status: Home / Self Care | Payer: Medicare Other | Source: Ambulatory Visit | Attending: Family Medicine | Admitting: Family Medicine

## 2017-11-29 DIAGNOSIS — E785 Hyperlipidemia, unspecified: Secondary | ICD-10-CM | POA: Insufficient documentation

## 2017-11-29 DIAGNOSIS — I77810 Thoracic aortic ectasia: Secondary | ICD-10-CM | POA: Diagnosis not present

## 2017-11-29 DIAGNOSIS — I110 Hypertensive heart disease with heart failure: Secondary | ICD-10-CM | POA: Diagnosis not present

## 2017-11-29 DIAGNOSIS — R0602 Shortness of breath: Secondary | ICD-10-CM | POA: Diagnosis not present

## 2017-11-29 DIAGNOSIS — I509 Heart failure, unspecified: Secondary | ICD-10-CM | POA: Diagnosis not present

## 2017-11-29 DIAGNOSIS — I252 Old myocardial infarction: Secondary | ICD-10-CM | POA: Diagnosis not present

## 2017-11-29 DIAGNOSIS — Z86711 Personal history of pulmonary embolism: Secondary | ICD-10-CM | POA: Diagnosis not present

## 2017-11-29 DIAGNOSIS — J449 Chronic obstructive pulmonary disease, unspecified: Secondary | ICD-10-CM | POA: Diagnosis not present

## 2017-11-29 DIAGNOSIS — I25119 Atherosclerotic heart disease of native coronary artery with unspecified angina pectoris: Secondary | ICD-10-CM | POA: Insufficient documentation

## 2017-11-29 NOTE — Progress Notes (Signed)
  Echocardiogram 2D Echocardiogram has been performed.  Adrain Butrick G Dajah Fischman 11/29/2017, 4:17 PM

## 2017-11-30 ENCOUNTER — Other Ambulatory Visit: Payer: Self-pay | Admitting: Family Medicine

## 2017-11-30 MED ORDER — FUROSEMIDE 20 MG PO TABS: 20.0000 mg | ORAL_TABLET | Freq: Every day | ORAL | 3 refills | Status: DC

## 2017-12-06 ENCOUNTER — Ambulatory Visit (HOSPITAL_COMMUNITY)
Admission: RE | Admit: 2017-12-06 | Discharge: 2017-12-06 | Disposition: A | Discharge status: Home / Self Care | Payer: Medicare Other | Source: Ambulatory Visit | Attending: Family Medicine | Admitting: Family Medicine

## 2017-12-06 DIAGNOSIS — I700 Atherosclerosis of aorta: Secondary | ICD-10-CM | POA: Insufficient documentation

## 2017-12-06 DIAGNOSIS — I2510 Atherosclerotic heart disease of native coronary artery without angina pectoris: Secondary | ICD-10-CM | POA: Insufficient documentation

## 2017-12-06 DIAGNOSIS — J432 Centrilobular emphysema: Secondary | ICD-10-CM | POA: Insufficient documentation

## 2017-12-06 DIAGNOSIS — J438 Other emphysema: Secondary | ICD-10-CM | POA: Diagnosis not present

## 2017-12-06 DIAGNOSIS — Z122 Encounter for screening for malignant neoplasm of respiratory organs: Secondary | ICD-10-CM | POA: Diagnosis not present

## 2017-12-06 DIAGNOSIS — F17210 Nicotine dependence, cigarettes, uncomplicated: Secondary | ICD-10-CM | POA: Insufficient documentation

## 2017-12-06 DIAGNOSIS — Z720 Tobacco use: Secondary | ICD-10-CM

## 2017-12-07 ENCOUNTER — Telehealth: Payer: Self-pay

## 2017-12-07 NOTE — Telephone Encounter (Signed)
-----   Message from Charlott Rakes, MD sent at 12/07/2017  2:18 PM EDT ----- CT chest for Lung cancer screen is negative.

## 2017-12-07 NOTE — Telephone Encounter (Signed)
Patient was called and informed of CT scan results.

## 2017-12-07 NOTE — Telephone Encounter (Signed)
Patient states that he still has SOB and wants to know what he sould do since CT results are negative.

## 2017-12-07 NOTE — Telephone Encounter (Signed)
Error

## 2017-12-10 NOTE — Telephone Encounter (Signed)
Patient was called and a voicemail was left informing patient to increase lasix and make appointment with PCP for SOB

## 2017-12-10 NOTE — Telephone Encounter (Signed)
I had sent a prescription for Lasix 20 mg to his pharmacy.  If he is still dyspneic on 20 mg of Lasix he can increase it to 40 mg of Lasix and schedule an appointment with me.

## 2017-12-25 ENCOUNTER — Telehealth: Payer: Self-pay | Admitting: Family Medicine

## 2017-12-25 DIAGNOSIS — G8929 Other chronic pain: Secondary | ICD-10-CM

## 2017-12-25 DIAGNOSIS — M546 Pain in thoracic spine: Principal | ICD-10-CM

## 2017-12-25 MED ORDER — TIZANIDINE HCL 4 MG PO TABS: 4.0000 mg | ORAL_TABLET | Freq: Three times a day (TID) | ORAL | 1 refills | Status: DC | PRN

## 2017-12-25 NOTE — Telephone Encounter (Signed)
RX for Atorvastatin(Cholesterol med) was sent to pharmacy on 11/20/17 and still has refills. Her Tizanidine(Muscle relaxer) was sent in today, she should contact her pharmacy to see when they will be ready for pick up.

## 2017-12-25 NOTE — Telephone Encounter (Signed)
1) Medication(s) Requested (by name): Muscle relaxer cholesteral  (patient doesn't know the names to the prescriptions)  2) Pharmacy of Choice:  3) Special Requests:   Approved medications will be sent to the pharmacy, we will reach out if there is an issue.  Requests made after 3pm may not be addressed until the following business day!  If a patient is unsure of the name of the medication(s) please note and ask patient to call back when they are able to provide all info, do not send to responsible party until all information is available!

## 2018-01-07 ENCOUNTER — Ambulatory Visit: Payer: Medicare Other | Admitting: Family Medicine

## 2018-02-18 ENCOUNTER — Ambulatory Visit: Payer: Medicare Other | Admitting: Family Medicine

## 2018-03-06 ENCOUNTER — Encounter: Payer: Self-pay | Admitting: Family Medicine

## 2018-03-06 ENCOUNTER — Ambulatory Visit: Payer: Medicare Other | Attending: Family Medicine | Admitting: Family Medicine

## 2018-03-06 VITALS — BP 129/82 | HR 78 | Temp 98.6°F | Ht 74.0 in | Wt 188.4 lb

## 2018-03-06 DIAGNOSIS — Z720 Tobacco use: Secondary | ICD-10-CM | POA: Insufficient documentation

## 2018-03-06 DIAGNOSIS — J328 Other chronic sinusitis: Secondary | ICD-10-CM

## 2018-03-06 DIAGNOSIS — Z716 Tobacco abuse counseling: Secondary | ICD-10-CM | POA: Insufficient documentation

## 2018-03-06 DIAGNOSIS — I25119 Atherosclerotic heart disease of native coronary artery with unspecified angina pectoris: Secondary | ICD-10-CM | POA: Diagnosis not present

## 2018-03-06 DIAGNOSIS — J438 Other emphysema: Secondary | ICD-10-CM | POA: Diagnosis not present

## 2018-03-06 DIAGNOSIS — Z86718 Personal history of other venous thrombosis and embolism: Secondary | ICD-10-CM

## 2018-03-06 DIAGNOSIS — I110 Hypertensive heart disease with heart failure: Secondary | ICD-10-CM | POA: Insufficient documentation

## 2018-03-06 DIAGNOSIS — I252 Old myocardial infarction: Secondary | ICD-10-CM | POA: Insufficient documentation

## 2018-03-06 DIAGNOSIS — Z79899 Other long term (current) drug therapy: Secondary | ICD-10-CM | POA: Insufficient documentation

## 2018-03-06 DIAGNOSIS — G629 Polyneuropathy, unspecified: Secondary | ICD-10-CM | POA: Insufficient documentation

## 2018-03-06 DIAGNOSIS — Z86711 Personal history of pulmonary embolism: Secondary | ICD-10-CM

## 2018-03-06 DIAGNOSIS — Z7901 Long term (current) use of anticoagulants: Secondary | ICD-10-CM | POA: Insufficient documentation

## 2018-03-06 DIAGNOSIS — I5042 Chronic combined systolic (congestive) and diastolic (congestive) heart failure: Secondary | ICD-10-CM | POA: Insufficient documentation

## 2018-03-06 DIAGNOSIS — Z955 Presence of coronary angioplasty implant and graft: Secondary | ICD-10-CM | POA: Insufficient documentation

## 2018-03-06 DIAGNOSIS — Z23 Encounter for immunization: Secondary | ICD-10-CM

## 2018-03-06 DIAGNOSIS — F17210 Nicotine dependence, cigarettes, uncomplicated: Secondary | ICD-10-CM | POA: Diagnosis not present

## 2018-03-06 DIAGNOSIS — J329 Chronic sinusitis, unspecified: Secondary | ICD-10-CM | POA: Insufficient documentation

## 2018-03-06 DIAGNOSIS — E7800 Pure hypercholesterolemia, unspecified: Secondary | ICD-10-CM | POA: Insufficient documentation

## 2018-03-06 DIAGNOSIS — K219 Gastro-esophageal reflux disease without esophagitis: Secondary | ICD-10-CM

## 2018-03-06 DIAGNOSIS — Z1211 Encounter for screening for malignant neoplasm of colon: Secondary | ICD-10-CM

## 2018-03-06 MED ORDER — APIXABAN 5 MG PO TABS: 5.0000 mg | ORAL_TABLET | Freq: Two times a day (BID) | ORAL | 3 refills | Status: DC

## 2018-03-06 MED ORDER — FLUTICASONE-SALMETEROL 250-50 MCG/DOSE IN AEPB: 1.0000 | INHALATION_SPRAY | Freq: Two times a day (BID) | RESPIRATORY_TRACT | 3 refills | Status: DC

## 2018-03-06 MED ORDER — GABAPENTIN 300 MG PO CAPS: 300.0000 mg | ORAL_CAPSULE | Freq: Two times a day (BID) | ORAL | 3 refills | Status: DC

## 2018-03-06 MED ORDER — PANTOPRAZOLE SODIUM 40 MG PO TBEC: 40.0000 mg | DELAYED_RELEASE_TABLET | Freq: Every day | ORAL | 3 refills | Status: DC

## 2018-03-06 MED ORDER — FUROSEMIDE 20 MG PO TABS: 20.0000 mg | ORAL_TABLET | Freq: Every day | ORAL | 3 refills | Status: DC

## 2018-03-06 MED ORDER — ALBUTEROL SULFATE HFA 108 (90 BASE) MCG/ACT IN AERS: 2.0000 | INHALATION_SPRAY | Freq: Four times a day (QID) | RESPIRATORY_TRACT | 3 refills | Status: DC | PRN

## 2018-03-06 MED ORDER — ATORVASTATIN CALCIUM 40 MG PO TABS: 40.0000 mg | ORAL_TABLET | Freq: Every day | ORAL | 3 refills | Status: DC

## 2018-03-06 MED ORDER — FLUTICASONE PROPIONATE 50 MCG/ACT NA SUSP: 2.0000 | Freq: Two times a day (BID) | NASAL | 2 refills | Status: DC

## 2018-03-06 NOTE — Progress Notes (Signed)
Subjective:  Patient ID: [name], male    DOB: 1960/07/04  Age: 57 y.o. MRN: [id number]  CC: Asthma   HPI ETHAN CLAYBURN is a 57 year old male with a history of COPD, chronic sinusitis, recurrent DVT and PE (on anticoagulation with Eliquis), coronary artery disease (status post stent, last cardiac cath from 12/2014 revealed two-vessel CAD, medical management recommended), tobacco abuse who presents today for follow-up visit. At his last visit he had complained of dyspnea for which an echocardiogram was ordered and revealed reduced EF of 40 to 45% down from 55 to 60% 1 year prior. Echo 11/29/2017: Study Conclusions  - Left ventricle: The cavity size was normal. Wall thickness was   normal. Systolic function was mildly to moderately reduced. The   estimated ejection fraction was in the range of 40% to 45%.   Diffuse hypokinesis. Doppler parameters are consistent with   abnormal left ventricular relaxation (grade 1 diastolic   dysfunction). - Ventricular septum: Septal motion showed abnormal function and   dyssynergy. The contour showed diastolic flattening. - Aorta: Aortic root dimension: 40 mm (ED). - Ascending aorta: The ascending aorta was mildly dilated.  Impressions:  - EF is reduced when compared to prior (55%).   Dyspnea has improved and he denies pedal edema, chest pain or weight gain.  He has been out of all his medications due to financial constraints but plans to obtain them next week.  He has been taking leftover prednisone which he had home as a result due to being out of his inhalers. He does have chronic sinus congestion but states the last 4 to 5 days have been better.  He continues to use Flonase. Denies bruising or bleeding with Eliquis.  Past Medical History:  Diagnosis Date  . Asthma   . CAD (coronary artery disease)    a. 09/2013 NSTEMI/PCI: LM nl, LAD 40-50%, D1 100 (2.25 x 28 Vision BMS), LCX min irregs, RI 60-70, 30, RCA 40-50/50-45m/p, EF  45-50%.  b. cath 12/2014 -occulded BMS in diag, 50% pro to mid LAD, 60% ramus, 30% RCA   . Chest pain 08/2015  . Chronic diastolic CHF (congestive heart failure) ([place]    a. 09/2014 EF 45-50% by LV gram;  b. 01/2014 Echo: EF 55-60%, Gr 1 DD.  .Marland KitchenCocaine abuse ([place]   . COPD (chronic obstructive pulmonary disease) ([facility]   . D[place]) [place]    "I had ~ 10 in each leg" (12/20/2016)  . Essential hypertension   . Hepatitis C    "clear free over a year now"  . High cholesterol   . Myocardial infarction ([facility] ~ 2014  . Osteoarthritis    a. hands and toes.  . Pneumonia    "several times" (12/20/2016)  . Pulmonary embolism ([place]    a. 2012 - s/p IVC filter;  b. prev on eliquis - noncompliant.  . Shortness of breath dyspnea   . Sinus headache   . Squamous cell skin cancer 07/13/2016   "foot"  . Tobacco abuse     Past Surgical History:  Procedure Laterality Date  . CARDIAC CATHETERIZATION N/A 01/04/2015   Procedure: Left Heart Cath and Coronary Angiography;  Surgeon: CBurnell Blanks MD;  [facility];  Service: Cardiovascular;  Laterality: N/A;  . CORONARY ANGIOPLASTY WITH STENT PLACEMENT  10/21/13   BMS to D1  . CYST EXCISION N/A 05/10/2016   Procedure: EXCISION OF SEBACEOUS CYST UPPER BACK;  Surgeon: MDonnie Mesa MD;  Location: [facility];  Service: General;  Laterality: N/A;  . FOOT SURGERY Right ~ 06/2016   "said there was skin cancer on it"  . LEFT HEART CATHETERIZATION WITH CORONARY ANGIOGRAM N/A 10/21/2013   Procedure: LEFT HEART CATHETERIZATION WITH CORONARY ANGIOGRAM;  Surgeon: Leonie Man, MD;  Location: [facility] C[facility];  Service: Cardiovascular;  Laterality: N/A;  . VENA CAVA FILTER PLACEMENT  ~ 2007-~ 2017   "1 in my neck; 2 in my wrist"    Allergies  Allergen Reactions  . No Known Allergies      Outpatient Medications Prior to Visit  Medication Sig Dispense Refill  . albuterol (PROVENTIL) (2.5 MG/3ML) 0.083% nebulizer solution Take 3  mLs (2.5 mg total) every 6 (six) hours as needed by nebulization for wheezing or shortness of breath. 75 mL 3  . ergocalciferol (DRISDOL) 50000 units capsule Take 1 capsule (50,000 Units total) by mouth once a week. 9 capsule 1  . tiZANidine (ZANAFLEX) 4 MG tablet Take 1 tablet (4 mg total) by mouth every 8 (eight) hours as needed for muscle spasms. 60 tablet 1  . albuterol (PROAIR HFA) 108 (90 Base) MCG/ACT inhaler Inhale 2 puffs into the lungs every 6 (six) hours as needed for wheezing or shortness of breath. 1 Inhaler 3  . apixaban (ELIQUIS) 5 MG TABS tablet Take 1 tablet (5 mg total) by mouth 2 (two) times daily. 60 tablet 3  . atorvastatin (LIPITOR) 40 MG tablet Take 1 tablet (40 mg total) by mouth daily at 6 PM. 30 tablet 3  . fluticasone (FLONASE) 50 MCG/ACT nasal spray Place 2 sprays into both nostrils 2 (two) times daily.  2  . Fluticasone-Salmeterol (ADVAIR DISKUS) 250-50 MCG/DOSE AEPB Inhale 1 puff into the lungs 2 (two) times daily. 1 each 3  . furosemide (LASIX) 20 MG tablet Take 1 tablet (20 mg total) by mouth daily. 30 tablet 3  . gabapentin (NEURONTIN) 300 MG capsule Take 1 capsule (300 mg total) by mouth 2 (two) times daily. 60 capsule 3  . pantoprazole (PROTONIX) 40 MG tablet Take 1 tablet (40 mg total) by mouth daily. 30 tablet 3  . traMADol (ULTRAM) 50 MG tablet Take 1 tablet (50 mg total) by mouth every 12 (twelve) hours as needed. 30 tablet 0  . acetaminophen (TYLENOL) 325 MG tablet Take 650 mg by mouth every 6 (six) hours as needed for mild pain.    Marland Kitchen diclofenac sodium (VOLTAREN) 1 % GEL Apply 4 g topically 4 (four) times daily. (Patient not taking: Reported on 10/03/2017) 100 g 1   No facility-administered medications prior to visit.     ROS Review of Systems  Constitutional: Negative for activity change and appetite change.  HENT: Negative for sinus pressure and sore throat.   Eyes: Negative for visual disturbance.  Respiratory: Negative for cough, chest tightness and  shortness of breath.   Cardiovascular: Negative for chest pain and leg swelling.  Gastrointestinal: Negative for abdominal distention, abdominal pain, constipation and diarrhea.  Endocrine: Negative.   Genitourinary: Negative for dysuria.  Musculoskeletal: Negative for joint swelling and myalgias.  Skin: Negative for rash.  Allergic/Immunologic: Negative.   Neurological: Negative for weakness, light-headedness and numbness.  Psychiatric/Behavioral: Negative for dysphoric mood and suicidal ideas.    Objective:  BP 129/82   Pulse 78   Temp 98.6 F (37 C) (Oral)   Ht _0  (1.88 m)   Wt 188 lb 6.4 oz (85.5 kg)   SpO2 93%   BMI 24.19 kg/m  BP/Weight 03/06/2018 11/20/2017 2/40/9735  Systolic BP 329 924 268  Diastolic BP 82 74 77  Wt. (Lbs) 188.4 191.2 -  BMI 24.19 24.55 -      Physical Exam  Constitutional: He is oriented to person, place, and time. He appears well-developed and well-nourished.  Cardiovascular: Normal rate, normal heart sounds and intact distal pulses.  No murmur heard. Pulmonary/Chest: Effort normal and breath sounds normal. He has no wheezes. He has no rales. He exhibits no tenderness.  Abdominal: Soft. Bowel sounds are normal. He exhibits no distension and no mass. There is no tenderness.  Musculoskeletal: Normal range of motion.  Neurological: He is alert and oriented to person, place, and time.  Skin: Skin is warm and dry.  Psychiatric: He has a normal mood and affect.     CMP Latest Ref Rng & Units 06/30/2017 04/10/2017 04/08/2017  Glucose 65 - 99 mg/dL 115(H) 99 233(H)  BUN 6 - 20 mg/dL 7 26(H) 20  Creatinine 0.61 - 1.24 mg/dL 0.69 0.88 0.83  Sodium 135 - 145 mmol/L 137 137 137  Potassium 3.5 - 5.1 mmol/L 3.6 4.0 4.3  Chloride 101 - 111 mmol/L 101 105 109  CO2 22 - 32 mmol/L 25 23 19(L)  Calcium 8.9 - 10.3 mg/dL 9.3 8.5(L) 9.2  Total Protein 6.0 - 8.5 g/dL - - -  Total Bilirubin 0.0 - 1.2 mg/dL - - -  Alkaline Phos 39 - 117 IU/L - - -  AST 0 -  40 IU/L - - -  ALT 0 - 44 IU/L - - -     Assessment & Plan:   1. Other emphysema ([place]) Hypoxia of 93% but surprisingly he is asymptomatic We have spoken with the pharmacy on site who will assist with refilling his medications Advised against using prednisone at this time as he is not in COPD exacerbation as this could exacerbate heart failure - albuterol (PROAIR HFA) 108 (90 Base) MCG/ACT inhaler; Inhale 2 puffs into the lungs every 6 (six) hours as needed for wheezing or shortness of breath.  Dispense: 1 Inhaler; Refill: 3 - Fluticasone-Salmeterol (ADVAIR DISKUS) 250-50 MCG/DOSE AEPB; Inhale 1 puff into the lungs 2 (two) times daily.  Dispense: 1 each; Refill: 3  2. Screening for colon cancer - Ambulatory referral to Gastroenterology  3. Other chronic sinusitis Continue Flonase, Zyrtec  4. Chronic combined systolic and diastolic congestive heart failure (HCC) EF of 40 to 45% from echo of 11/2017 which has decreased from 55-60% Euvolemic Continue Lasix - CMP14+EGFR  5. Tobacco abuse Spent 3 minutes counseling on cessation and he is not ready to quit at this time  6. History of pulmonary embolism Recurrent DVT and PE Remain on lifelong anticoagulation - apixaban (ELIQUIS) 5 MG TABS tablet; Take 1 tablet (5 mg total) by mouth 2 (two) times daily.  Dispense: 60 tablet; Refill: 3  7. History of recurrent deep vein thrombosis (DVT) See #6 above - apixaban (ELIQUIS) 5 MG TABS tablet; Take 1 tablet (5 mg total) by mouth 2 (two) times daily.  Dispense: 60 tablet; Refill: 3  8. Coronary artery disease involving native coronary artery of native heart with angina pectoris (HCC) Stable Low-cholesterol diet - atorvastatin (LIPITOR) 40 MG tablet; Take 1 tablet (40 mg total) by mouth daily at 6 PM.  Dispense: 30 tablet; Refill: 3  9. Neuropathy Controlled - gabapentin (NEURONTIN) 300 MG capsule; Take 1 capsule (300 mg total) by mouth 2 (two) times daily.  Dispense: 60 capsule; Refill:  3  10.  Gastroesophageal reflux disease without esophagitis Stable - pantoprazole (PROTONIX) 40 MG tablet; Take 1 tablet (40 mg total) by mouth daily.  Dispense: 30 tablet; Refill: 3   Meds ordered this encounter  Medications  . albuterol (PROAIR HFA) 108 (90 Base) MCG/ACT inhaler    Sig: Inhale 2 puffs into the lungs every 6 (six) hours as needed for wheezing or shortness of breath.    Dispense:  1 Inhaler    Refill:  3  . apixaban (ELIQUIS) 5 MG TABS tablet    Sig: Take 1 tablet (5 mg total) by mouth 2 (two) times daily.    Dispense:  60 tablet    Refill:  3  . atorvastatin (LIPITOR) 40 MG tablet    Sig: Take 1 tablet (40 mg total) by mouth daily at 6 PM.    Dispense:  30 tablet    Refill:  3  . fluticasone (FLONASE) 50 MCG/ACT nasal spray    Sig: Place 2 sprays into both nostrils 2 (two) times daily.    Dispense:  16 g    Refill:  2  . Fluticasone-Salmeterol (ADVAIR DISKUS) 250-50 MCG/DOSE AEPB    Sig: Inhale 1 puff into the lungs 2 (two) times daily.    Dispense:  1 each    Refill:  3  . furosemide (LASIX) 20 MG tablet    Sig: Take 1 tablet (20 mg total) by mouth daily.    Dispense:  30 tablet    Refill:  3  . gabapentin (NEURONTIN) 300 MG capsule    Sig: Take 1 capsule (300 mg total) by mouth 2 (two) times daily.    Dispense:  60 capsule    Refill:  3  . pantoprazole (PROTONIX) 40 MG tablet    Sig: Take 1 tablet (40 mg total) by mouth daily.    Dispense:  30 tablet    Refill:  3    Follow-up: Return in about 3 months (around 06/05/2018) for Follow-up of chronic medical conditions.   Charlott Rakes MD

## 2018-03-07 LAB — CMP14+EGFR
A/G RATIO: 1.3 (ref 1.2–2.2)
ALT: 34 IU/L (ref 0–44)
AST: 39 IU/L (ref 0–40)
Albumin: 4.4 g/dL (ref 3.5–5.5)
Alkaline Phosphatase: 143 IU/L — ABNORMAL HIGH (ref 39–117)
BUN/Creatinine Ratio: 20 (ref 9–20)
BUN: 16 mg/dL (ref 6–24)
Bilirubin Total: 0.3 mg/dL (ref 0.0–1.2)
CALCIUM: 9.8 mg/dL (ref 8.7–10.2)
CHLORIDE: 105 mmol/L (ref 96–106)
CO2: 17 mmol/L — ABNORMAL LOW (ref 20–29)
Creatinine, Ser: 0.8 mg/dL (ref 0.76–1.27)
GFR, EST AFRICAN AMERICAN: 115 mL/min/{1.73_m2} (ref >59)
GFR, EST NON AFRICAN AMERICAN: 99 mL/min/{1.73_m2} (ref >59)
GLOBULIN, TOTAL: 3.5 g/dL (ref 1.5–4.5)
Sodium: 142 mmol/L (ref 134–144)
TOTAL PROTEIN: 7.9 g/dL (ref 6.0–8.5)

## 2018-03-08 ENCOUNTER — Telehealth: Payer: Self-pay

## 2018-03-08 NOTE — Telephone Encounter (Signed)
-----   Message from Charlott Rakes, MD sent at 03/07/2018  4:12 PM EST ----- Labs are stable

## 2018-03-08 NOTE — Telephone Encounter (Signed)
Patient was called and informed of lab results. 

## 2018-03-22 ENCOUNTER — Encounter: Payer: Self-pay | Admitting: Gastroenterology

## 2018-04-03 ENCOUNTER — Telehealth: Payer: Self-pay | Admitting: *Deleted

## 2018-04-03 NOTE — Telephone Encounter (Signed)
Pt is scheduled for a direct colon 2-5- pt has no gi hx- pt is on ELiquis - he will need an OV not a PV- LMTRC to me before 5 pm today   Lelan Pons pV

## 2018-04-03 NOTE — Telephone Encounter (Signed)
Pt is on Eliquis per pt--  I cancelled 1-22 PV and made an OV for 1-17 with J Lemmon at 845 am - pt still has colon scheduled for 2-5. No GI hx-   [name]

## 2018-04-12 ENCOUNTER — Ambulatory Visit (INDEPENDENT_AMBULATORY_CARE_PROVIDER_SITE_OTHER): Payer: Medicare Other | Admitting: Physician Assistant

## 2018-04-12 ENCOUNTER — Encounter: Payer: Self-pay | Admitting: Physician Assistant

## 2018-04-12 ENCOUNTER — Telehealth: Payer: Self-pay

## 2018-04-12 VITALS — BP 122/86 | HR 74 | Ht 74.0 in | Wt 191.2 lb

## 2018-04-12 DIAGNOSIS — Z7901 Long term (current) use of anticoagulants: Secondary | ICD-10-CM

## 2018-04-12 DIAGNOSIS — Z01818 Encounter for other preprocedural examination: Secondary | ICD-10-CM

## 2018-04-12 DIAGNOSIS — Z1211 Encounter for screening for malignant neoplasm of colon: Secondary | ICD-10-CM | POA: Diagnosis not present

## 2018-04-12 MED ORDER — NA SULFATE-K SULFATE-MG SULF 17.5-3.13-1.6 GM/177ML PO SOLN: 1.0000 | Freq: Once | ORAL | 0 refills | Status: AC

## 2018-04-12 NOTE — Progress Notes (Signed)
Chief Complaint: Preprocedural visit for a screening colonoscopy on anticoagulation  HPI:    [name] is a 58 year old African-American male with a past medical history as listed below including COPD, recurrent DVT and PE on Eliquis (echo 11/29/2017 LVEF 40-45%), CAD status post stent, who was referred to me by Charlott Rakes, MD for a preprocedural visit for screening colonoscopy (patient is already scheduled on 05/01/2018 with Dr. Havery Moros).      Today, the patient tells me that he is doing well from a GI standpoint and has no complaints.  Tells me that he tried to get a colonoscopy 3 to 4 years ago at a different place in town but they never called him back to actually schedule his appointment.  He would like to get this done and over with.    Tells me he has been on Eliquis for years ever since his history of multiple DVTs and PE, the last a year ago.    Denies fever, chills, weight loss, nausea, vomiting, heartburn, reflux, abdominal pain or change in bowel habits.  Past Medical History:  Diagnosis Date  . Asthma   . CAD (coronary artery disease)    a. 09/2013 NSTEMI/PCI: LM nl, LAD 40-50%, D1 100 (2.25 x 28 Vision BMS), LCX min irregs, RI 60-70, 30, RCA 40-50/50-32ms/p, EF 45-50%.  b. cath 12/2014 -occulded BMS in diag, 50% pro to mid LAD, 60% ramus, 30% RCA   . Chest pain 08/2015  . Chronic diastolic CHF (congestive heart failure) ([place])    a. 09/2014 EF 45-50% by LV gram;  b. 01/2014 Echo: EF 55-60%, Gr 1 DD.  Marland Kitchen Cocaine abuse ([place])   . COPD (chronic obstructive pulmonary disease) ([place])   . DVT (deep venous thrombosis) ([place])    "I had ~ 10 in each leg" (12/20/2016)  . Essential hypertension   . Hepatitis C    "clear free over a year now"  . High cholesterol   . Myocardial infarction ([place]) ~ 2014  . Osteoarthritis    a. hands and toes.  . Pneumonia    "several times" (12/20/2016)  . Pulmonary embolism ([place])    a. 2012 - s/p IVC filter;  b. prev on eliquis - noncompliant.  .  Shortness of breath dyspnea   . Sinus headache   . Squamous cell skin cancer 07/13/2016   "foot"  . Tobacco abuse     Past Surgical History:  Procedure Laterality Date  . CARDIAC CATHETERIZATION N/A 01/04/2015   Procedure: Left Heart Cath and Coronary Angiography;  Surgeon: Burnell Blanks, MD;  Location: [facility];  Service: Cardiovascular;  Laterality: N/A;  . CORONARY ANGIOPLASTY WITH STENT PLACEMENT  10/21/13   BMS to D1  . CYST EXCISION N/A 05/10/2016   Procedure: EXCISION OF SEBACEOUS CYST UPPER BACK;  Surgeon: Donnie Mesa, MD;  Location: [facility];  Service: General;  Laterality: N/A;  . FOOT SURGERY Right ~ 06/2016   "said there was skin cancer on it"  . LEFT HEART CATHETERIZATION WITH CORONARY ANGIOGRAM N/A 10/21/2013   Procedure: LEFT HEART CATHETERIZATION WITH CORONARY ANGIOGRAM;  Surgeon: Leonie Man, MD;  Location: [facility] CATH LAB;  Service: Cardiovascular;  Laterality: N/A;  . VENA CAVA FILTER PLACEMENT  ~ 2007-~ 2017   "1 in my neck; 2 in my wrist"    Current Outpatient Medications  Medication Sig Dispense Refill  . acetaminophen (TYLENOL) 325 MG tablet Take 650 mg by mouth every 6 (six) hours as needed for mild pain.    Marland Kitchen  albuterol (PROAIR HFA) 108 (90 Base) MCG/ACT inhaler Inhale 2 puffs into the lungs every 6 (six) hours as needed for wheezing or shortness of breath. 1 Inhaler 3  . albuterol (PROVENTIL) (2.5 MG/3ML) 0.083% nebulizer solution Take 3 mLs (2.5 mg total) every 6 (six) hours as needed by nebulization for wheezing or shortness of breath. 75 mL 3  . apixaban (ELIQUIS) 5 MG TABS tablet Take 1 tablet (5 mg total) by mouth 2 (two) times daily. 60 tablet 3  . atorvastatin (LIPITOR) 40 MG tablet Take 1 tablet (40 mg total) by mouth daily at 6 PM. 30 tablet 3  . diclofenac sodium (VOLTAREN) 1 % GEL Apply 4 g topically 4 (four) times daily. (Patient not taking: Reported on 10/03/2017) 100 g 1  . ergocalciferol (DRISDOL) 50000 units capsule Take 1 capsule  (50,000 Units total) by mouth once a week. 9 capsule 1  . fluticasone (FLONASE) 50 MCG/ACT nasal spray Place 2 sprays into both nostrils 2 (two) times daily. 16 g 2  . Fluticasone-Salmeterol (ADVAIR DISKUS) 250-50 MCG/DOSE AEPB Inhale 1 puff into the lungs 2 (two) times daily. 1 each 3  . furosemide (LASIX) 20 MG tablet Take 1 tablet (20 mg total) by mouth daily. 30 tablet 3  . gabapentin (NEURONTIN) 300 MG capsule Take 1 capsule (300 mg total) by mouth 2 (two) times daily. 60 capsule 3  . pantoprazole (PROTONIX) 40 MG tablet Take 1 tablet (40 mg total) by mouth daily. 30 tablet 3  . tiZANidine (ZANAFLEX) 4 MG tablet Take 1 tablet (4 mg total) by mouth every 8 (eight) hours as needed for muscle spasms. 60 tablet 1   No current facility-administered medications for this visit.     Allergies as of 04/12/2018 - Review Complete 04/12/2018  Allergen Reaction Noted  . No known allergies  05/09/2016    Family History  Problem Relation Age of Onset  . Asthma Mother   . Allergies Mother   . Allergies Sister   . Allergies Brother   . Deep vein thrombosis Brother        two brothers with recurrent DVT  . Heart attack Father        a.60s b. deceased in his 11s  . Coronary artery disease Father     Social History   Socioeconomic History  . Marital status: Single    Spouse name: Not on file  . Number of children: Not on file  . Years of education: Not on file  . Highest education level: Not on file  Occupational History  . Occupation: unemployed  Social Needs  . Financial resource strain: Not on file  . Food insecurity:    Worry: Not on file    Inability: Not on file  . Transportation needs:    Medical: Not on file    Non-medical: Not on file  Tobacco Use  . Smoking status: Current Every Day Smoker    Packs/day: 0.25    Years: 39.00    Pack years: 9.75    Types: Cigarettes, Cigars  . Smokeless tobacco: Never Used  Substance and Sexual Activity  . Alcohol use: Yes     Alcohol/week: 11.0 standard drinks    Types: 7 Cans of beer, 4 Shots of liquor per week    Comment: 12/20/2016 "couple 40's on the weekend; shot qod"  . Drug use: Yes    Types: Cocaine    Comment: 12/20/2016 "might have used some the other day; I'm not sure"  . Sexual activity:  Not Currently  Lifestyle  . Physical activity:    Days per week: Not on file    Minutes per session: Not on file  . Stress: Not on file  Relationships  . Social connections:    Talks on phone: Not on file    Gets together: Not on file    Attends religious service: Not on file    Active member of club or organization: Not on file    Attends meetings of clubs or organizations: Not on file    Relationship status: Not on file  . Intimate partner violence:    Fear of current or ex partner: Not on file    Emotionally abused: Not on file    Physically abused: Not on file    Forced sexual activity: Not on file  Other Topics Concern  . Not on file  Social History Narrative   Lives in [place].    Review of Systems:    Constitutional: No weight loss, fever or chills Skin: No rash  Cardiovascular: No chest pain Respiratory: No SOB  Gastrointestinal: See HPI and otherwise negative Genitourinary: No dysuria  Neurological: No headache, dizziness or syncope Musculoskeletal: No new muscle or joint pain Hematologic: No bleeding  Psychiatric: No history of depression or anxiety   Physical Exam:  Vital signs: BP 122/86   Pulse 74   Ht 6\' 2"  (1.88 m)   Wt 191 lb 4 oz (86.8 kg)   BMI 24.56 kg/m   Constitutional:   Pleasant AA male appears to be in NAD, Well developed, Well nourished, alert and cooperative Head:  Normocephalic and atraumatic. Eyes:   PEERL, EOMI. No icterus. Conjunctiva pink. Ears:  Normal auditory acuity. Neck:  Supple Throat: Oral cavity and pharynx without inflammation, swelling or lesion.  Respiratory: Respirations even and unlabored. Lungs clear to auscultation bilaterally.   No wheezes,  crackles, or rhonchi.  Cardiovascular: Normal S1, S2. No MRG. Regular rate and rhythm. No peripheral edema, cyanosis or pallor.  Gastrointestinal:  Soft, nondistended, nontender. No rebound or guarding. Normal bowel sounds. No appreciable masses or hepatomegaly. Rectal:  Not performed.  Msk:  Symmetrical without gross deformities. Without edema, no deformity or joint abnormality.  Neurologic:  Alert and  oriented x4;  grossly normal neurologically.  Skin:   Dry and intact without significant lesions or rashes. Psychiatric: Demonstrates good judgement and reason without abnormal affect or behaviors.  No recent labs or imaging.  Assessment: 1.  Screening for colorectal cancer: Patient is now 89, never had a screening colonoscopy 2.  Chronic anticoagulation: For DVT/PE with Xarelto  Plan: 1. Patient already scheduled for colonoscopy with Dr. Havery Moros on February 5th.  Reviewed risks, benefits, limitations and alternatives and the patient agrees to proceed. 2.  Patient was advised to hold his Eliquis for 2 days prior to time of procedure.  We will contact his primary care physician to ensure that holding his Eliquis is acceptable for him. 3.  Patient to follow in clinic per recommendations from Dr. Havery Moros after time of procedure  Ellouise Newer, PA-C [facility] 04/12/2018, 8:52 AM  Cc: Charlott Rakes, MD

## 2018-04-12 NOTE — Telephone Encounter (Signed)
[facility] Pre-operative Risk Assessment     Request for surgical clearance:     Endoscopy Procedure  What type of surgery is being performed?     Colonoscopy   When is this surgery scheduled?     05-01-2018  What type of clearance is required ?   Pharmacy  Are there any medications that need to be held prior to surgery and how long? Eliquis 2 days  Practice name and name of physician performing surgery?      [facility]  What is your office phone and fax number?      Phone- [phone]  [phone]  Anesthesia type (None, local, MAC, general) ?       MAC

## 2018-04-12 NOTE — Patient Instructions (Signed)
If you are age 58 or older, your body mass index should be between 23-30. Your Body mass index is 24.56 kg/m. If this is out of the aforementioned range listed, please consider follow up with your Primary Care Provider.  If you are age 17 or younger, your body mass index should be between 19-25. Your Body mass index is 24.56 kg/m. If this is out of the aformentioned range listed, please consider follow up with your Primary Care Provider.   You have been scheduled for a colonoscopy. Please follow written instructions given to you at your visit today.  Please pick up your prep supplies at the pharmacy within the next 1-3 days. If you use inhalers (even only as needed), please bring them with you on the day of your procedure. Your physician has requested that you go to www.startemmi.com and enter the access code given to you at your visit today. This web site gives a general overview about your procedure. However, you should still follow specific instructions given to you by our office regarding your preparation for the procedure.  It was a pleasure to see you today!  Ellouise Newer PA-C

## 2018-04-12 NOTE — Telephone Encounter (Signed)
Pt has been notified and aware to hold the Eliquis. He states clear understanding.

## 2018-04-12 NOTE — Progress Notes (Signed)
Agree with assessment and plan as outlined.  

## 2018-04-12 NOTE — Telephone Encounter (Signed)
Okay to hold Eliquis for 2 days.  Resume 24 hours post procedure.

## 2018-04-22 ENCOUNTER — Other Ambulatory Visit: Payer: Self-pay | Admitting: Family Medicine

## 2018-04-22 DIAGNOSIS — J438 Other emphysema: Secondary | ICD-10-CM

## 2018-05-01 ENCOUNTER — Ambulatory Visit (AMBULATORY_SURGERY_CENTER): Payer: Medicare Other | Admitting: Gastroenterology

## 2018-05-01 ENCOUNTER — Encounter: Payer: Self-pay | Admitting: Gastroenterology

## 2018-05-01 VITALS — BP 118/88 | HR 82 | Temp 95.5°F | Resp 12 | Ht 74.0 in | Wt 191.0 lb

## 2018-05-01 DIAGNOSIS — D125 Benign neoplasm of sigmoid colon: Secondary | ICD-10-CM | POA: Diagnosis not present

## 2018-05-01 DIAGNOSIS — D127 Benign neoplasm of rectosigmoid junction: Secondary | ICD-10-CM

## 2018-05-01 DIAGNOSIS — D128 Benign neoplasm of rectum: Secondary | ICD-10-CM

## 2018-05-01 DIAGNOSIS — D123 Benign neoplasm of transverse colon: Secondary | ICD-10-CM | POA: Diagnosis not present

## 2018-05-01 DIAGNOSIS — Z1211 Encounter for screening for malignant neoplasm of colon: Secondary | ICD-10-CM

## 2018-05-01 MED ORDER — SODIUM CHLORIDE 0.9 % IV SOLN: 500.0000 mL | Freq: Once | INTRAVENOUS | Status: DC

## 2018-05-01 NOTE — Op Note (Signed)
[facility] Patient Name: [name] Procedure Date: 05/01/2018 9:20 AM MRN: [id number] Endoscopist: Remo Lipps P. Havery Moros , MD Age: 58 Referring MD:  Date of Birth: 09-28-1960 Gender: Male Account #: [id number] Procedure:                Colonoscopy Indications:              Screening for colorectal malignant neoplasm, This                            is the patient's first colonoscopy Medicines:                Monitored Anesthesia Care Procedure:                Pre-Anesthesia Assessment:                           - Prior to the procedure, a History and Physical                            was performed, and patient medications and                            allergies were reviewed. The patient's tolerance of                            previous anesthesia was also reviewed. The risks                            and benefits of the procedure and the sedation                            options and risks were discussed with the patient.                            All questions were answered, and informed consent                            was obtained. Prior Anticoagulants: The patient has                            taken Eliquis (apixaban), last dose was 2 days                            prior to procedure. ASA Grade Assessment: III - A                            patient with severe systemic disease. After                            reviewing the risks and benefits, the patient was                            deemed in satisfactory condition to undergo the  procedure.                           After obtaining informed consent, the colonoscope                            was passed under direct vision. Throughout the                            procedure, the patient's blood pressure, pulse, and                            oxygen saturations were monitored continuously. The                            Model CF-HQ190L [id number]) scope was introduced                        through the anus and advanced to the the cecum,                            identified by appendiceal orifice and ileocecal                            valve. The colonoscopy was performed without                            difficulty. The patient tolerated the procedure                            well. The quality of the bowel preparation was                            good. The ileocecal valve, appendiceal orifice, and                            rectum were photographed. Scope In: 9:34:14 AM Scope Out: 10:02:12 AM Scope Withdrawal Time: 0 hours 21 minutes 46 seconds  Total Procedure Duration: 0 hours 27 minutes 58 seconds  Findings:                 The perianal and digital rectal examinations were                            normal.                           A 6 mm polyp was found in the transverse colon. The                            polyp was sessile. The polyp was removed with a                            cold snare. Resection and retrieval were complete.  A 3 mm polyp was found in the sigmoid colon. The                            polyp was sessile. The polyp was removed with a                            cold snare. Resection and retrieval were complete.                           A 3 mm polyp was found in the recto-sigmoid colon.                            The polyp was sessile. The polyp was removed with a                            cold snare. Resection and retrieval were complete.                           Five sessile polyps were found in the rectum. The                            polyps were 3 to 4 mm in size. These polyps were                            removed with a cold snare. Resection and retrieval                            were complete. These were removed upon entering the                            colon. At one site of diminutive polypectomy, there                            was persistent oozing upon withdrawing the                             endoscope. Coagulation of the site was performed                            using the snare tip with good hemostasis.                           Multiple medium-mouthed diverticula were found in                            the transverse colon and left colon.                           Internal hemorrhoids were found during                            retroflexion. The hemorrhoids were small.  The exam was otherwise without abnormality. Complications:            No immediate complications. Estimated blood loss:                            Minimal. Estimated Blood Loss:     Estimated blood loss was minimal. Impression:               - One 6 mm polyp in the transverse colon, removed                            with a cold snare. Resected and retrieved.                           - One 3 mm polyp in the sigmoid colon, removed with                            a cold snare. Resected and retrieved.                           - One 3 mm polyp at the recto-sigmoid colon,                            removed with a cold snare. Resected and retrieved.                           - Five 3 to 4 mm polyps in the rectum, removed with                            a cold snare. Resected and retrieved.                           - Diverticulosis in the transverse colon and in the                            left colon.                           - Internal hemorrhoids.                           - The examination was otherwise normal. Recommendation:           - Patient has a contact number available for                            emergencies. The signs and symptoms of potential                            delayed complications were discussed with the                            patient. Return to normal activities tomorrow.  Written discharge instructions were provided to the                            patient.                           - Resume previous  diet.                           - Continue present medications.                           - Resume Eliquis tomorrow                           - Await pathology results. Remo Lipps P. Bartosz Luginbill, MD 05/01/2018 10:09:46 AM This report has been signed electronically.

## 2018-05-01 NOTE — Patient Instructions (Signed)
Handouts given on polyps, hemorrhoids and diverticulosis. 8 polyps were removed today. Start back on your [facility].  GOOD 1ST SMALL MEAL: EGGS  GRITS TOAST  PANCAKES OR WAFFLES LEAN MEAT   YOU HAD AN ENDOSCOPIC PROCEDURE TODAY AT THE [facility]:   Refer to the procedure report that was given to you for any specific questions about what was found during the examination.  If the procedure report does not answer your questions, please call your gastroenterologist to clarify.  If you requested that your care partner not be given the details of your procedure findings, then the procedure report has been included in a sealed envelope for you to review at your convenience later.  YOU SHOULD EXPECT: Some feelings of bloating in the abdomen. Passage of more gas than usual.  Walking can help get rid of the air that was put into your GI tract during the procedure and reduce the bloating. If you had a lower endoscopy (such as a colonoscopy or flexible sigmoidoscopy) you may notice spotting of blood in your stool or on the toilet paper. If you underwent a bowel prep for your procedure, you may not have a normal bowel movement for a few days.  Please Note:  You might notice some irritation and congestion in your nose or some drainage.  This is from the oxygen used during your procedure.  There is no need for concern and it should clear up in a day or so.  SYMPTOMS TO REPORT IMMEDIATELY:   Following lower endoscopy (colonoscopy or flexible sigmoidoscopy):  Excessive amounts of blood in the stool  Significant tenderness or worsening of abdominal pains  Swelling of the abdomen that is new, acute  Fever of 100F or higher   For urgent or emergent issues, a gastroenterologist can be reached at any hour by calling [phone].   DIET:  We do recommend a small meal at first, but then you may proceed to your regular diet.  Drink plenty of fluids but you should avoid alcoholic  beverages for 24 hours.  ACTIVITY:  You should plan to take it easy for the rest of today and you should NOT DRIVE or use heavy machinery until tomorrow (because of the sedation medicines used during the test).    FOLLOW UP: Our staff will call the number listed on your records the next business day following your procedure to check on you and address any questions or concerns that you may have regarding the information given to you following your procedure. If we do not reach you, we will leave a message.  However, if you are feeling well and you are not experiencing any problems, there is no need to return our call.  We will assume that you have returned to your regular daily activities without incident.  If any biopsies were taken you will be contacted by phone or by letter within the next 1-3 weeks.  Please call us at [phone] if you have not heard about the biopsies in 3 weeks.    SIGNATURES/CONFIDENTIALITY: You and/or your care partner have signed paperwork which will be entered into your electronic medical record.  These signatures attest to the fact that that the information above on your After Visit Summary has been reviewed and is understood.  Full responsibility of the confidentiality of this discharge information lies with you and/or your care-partner.

## 2018-05-01 NOTE — Progress Notes (Signed)
A/ox3, pleased with MAC, report to RN 

## 2018-05-01 NOTE — Progress Notes (Signed)
Called to room to assist during endoscopic procedure.  Patient ID and intended procedure confirmed with present staff. Received instructions for my participation in the procedure from the performing physician.  

## 2018-05-02 ENCOUNTER — Telehealth: Payer: Self-pay

## 2018-05-02 NOTE — Telephone Encounter (Signed)
  Follow up Call-  Call back number 05/01/2018  Post procedure Call Back phone  # [phone]  Permission to leave phone message Yes  Some recent data might be hidden     Patient questions:  Do you have a fever, pain , or abdominal swelling? No. Pain Score  0 *  Have you tolerated food without any problems? Yes.    Have you been able to return to your normal activities? Yes.    Do you have any questions about your discharge instructions: Diet   No. Medications  No. Follow up visit  No.  Do you have questions or concerns about your Care? No.  Actions: * If pain score is 4 or above: No action needed, pain <4.

## 2018-05-05 ENCOUNTER — Encounter: Payer: Self-pay | Admitting: Gastroenterology

## 2018-05-28 MED FILL — GABAPENTIN 300 MG CAPSULE: 300 | 30 days supply | Qty: 60 | Fill #0

## 2018-05-28 MED FILL — FUROSEMIDE 20 MG TABLET: 20 | 30 days supply | Qty: 30 | Fill #0

## 2018-05-28 MED FILL — FLUTICASONE PROP 50 MCG SPR: 50 | 30 days supply | Qty: 16 | Fill #0

## 2018-05-28 MED FILL — PROAIR HFA 90 MCG INHALER: 108 (90 BAS | 25 days supply | Qty: 9 | Fill #0

## 2018-05-28 MED FILL — FLUTICASONE-SALMETEROL 250-: 250-50 | 30 days supply | Qty: 60 | Fill #0

## 2018-05-28 MED FILL — ATORVASTATIN CALCIUM 40 MG: 40 | 30 days supply | Qty: 30 | Fill #0

## 2018-05-28 MED FILL — ELIQUIS 5 MG TABLET: 5 | 30 days supply | Qty: 60 | Fill #0

## 2018-05-28 MED FILL — PANTOPRAZOLE SOD DR 40 MG T: 40 | 30 days supply | Qty: 30 | Fill #0

## 2018-07-09 MED FILL — FLUTICASONE PROP 50 MCG SPR: 50 | 30 days supply | Qty: 16 | Fill #1

## 2018-07-09 MED FILL — ELIQUIS 5 MG TABLET: 5 | 30 days supply | Qty: 60 | Fill #1

## 2018-07-09 MED FILL — FLUTICASONE-SALMETEROL 250-: 250-50 | 30 days supply | Qty: 60 | Fill #1

## 2018-07-09 MED FILL — ATORVASTATIN CALCIUM 40 MG: 40 | 30 days supply | Qty: 30 | Fill #1

## 2018-07-09 MED FILL — PANTOPRAZOLE SOD DR 40 MG T: 40 | 90 days supply | Qty: 90 | Fill #1

## 2018-07-09 MED FILL — GABAPENTIN 300 MG CAPSULE: 300 | 30 days supply | Qty: 60 | Fill #1

## 2018-07-09 MED FILL — FUROSEMIDE 20 MG TABS: 20 | 30 days supply | Qty: 30 | Fill #1

## 2018-07-09 MED FILL — PROAIR HFA 90 MCG INHALER: 108 (90 BAS | 25 days supply | Qty: 9 | Fill #1

## 2018-07-23 ENCOUNTER — Ambulatory Visit: Payer: Medicare Other | Attending: Internal Medicine | Admitting: Internal Medicine

## 2018-07-23 ENCOUNTER — Ambulatory Visit: Payer: Medicare Other | Admitting: Internal Medicine

## 2018-07-23 ENCOUNTER — Other Ambulatory Visit: Payer: Self-pay

## 2018-07-23 DIAGNOSIS — F17200 Nicotine dependence, unspecified, uncomplicated: Secondary | ICD-10-CM

## 2018-07-23 DIAGNOSIS — J441 Chronic obstructive pulmonary disease with (acute) exacerbation: Secondary | ICD-10-CM

## 2018-07-23 MED ORDER — AZITHROMYCIN 250 MG PO TABS: ORAL_TABLET | ORAL | 0 refills | Status: DC

## 2018-07-23 MED ORDER — NICOTINE 14 MG/24HR TD PT24: 14.0000 mg | MEDICATED_PATCH | Freq: Every day | TRANSDERMAL | 0 refills | Status: DC

## 2018-07-23 MED ORDER — FLUTICASONE-SALMETEROL 250-50 MCG/DOSE IN AEPB: INHALATION_SPRAY | RESPIRATORY_TRACT | 2 refills | Status: DC

## 2018-07-23 MED ORDER — ALBUTEROL SULFATE (2.5 MG/3ML) 0.083% IN NEBU: 2.5000 mg | INHALATION_SOLUTION | Freq: Four times a day (QID) | RESPIRATORY_TRACT | 3 refills | Status: DC | PRN

## 2018-07-23 MED ORDER — UMECLIDINIUM BROMIDE 62.5 MCG/INH IN AEPB: 1.0000 | INHALATION_SPRAY | Freq: Every day | RESPIRATORY_TRACT | 6 refills | Status: DC

## 2018-07-23 MED ORDER — PREDNISONE 20 MG PO TABS: 40.0000 mg | ORAL_TABLET | Freq: Every day | ORAL | 0 refills | Status: DC

## 2018-07-23 MED ORDER — ALBUTEROL SULFATE HFA 108 (90 BASE) MCG/ACT IN AERS: 2.0000 | INHALATION_SPRAY | Freq: Four times a day (QID) | RESPIRATORY_TRACT | 3 refills | Status: DC | PRN

## 2018-07-23 MED ORDER — NICOTINE 7 MG/24HR TD PT24: 7.0000 mg | MEDICATED_PATCH | Freq: Every day | TRANSDERMAL | 0 refills | Status: DC

## 2018-07-23 MED FILL — PROAIR HFA 90 MCG INHALER: 108 (90 BAS | 25 days supply | Qty: 9 | Fill #0

## 2018-07-23 MED FILL — INCRUSE ELLIPTA 62.5 MCG IN: 62.5 | 30 days supply | Qty: 30 | Fill #0

## 2018-07-23 MED FILL — ALBUTEROL SUL 2.5 MG/3 ML S: (2.5 MG/3ML | 6 days supply | Qty: 75 | Fill #0

## 2018-07-23 MED FILL — predniSONE 20 MG TABS: 20 | 5 days supply | Qty: 10 | Fill #0

## 2018-07-23 MED FILL — ADVAIR 250/50 DISKUS: 250-50 | 30 days supply | Qty: 60 | Fill #0

## 2018-07-23 MED FILL — AZITHROMYCIN 250 MG TABLET: 250 | 5 days supply | Qty: 6 | Fill #0

## 2018-07-23 NOTE — Progress Notes (Signed)
Pt states his breathing is better that he still has a cough  Pt states he has been using his inhalers

## 2018-07-23 NOTE — Progress Notes (Signed)
Virtual Visit via Telephone Note Due to current restrictions/limitations of in-office visits due to the COVID-19 pandemic, this scheduled clinical appointment was converted to a telehealth visit  I connected with [name] on 07/23/18 at 8:41 a.m by telephone from my office and verified that I am speaking with the correct person using two identifiers.  The pt is at home.  Only the patient and myself participated in this telephone encounter.   I discussed the limitations, risks, security and privacy concerns of performing an evaluation and management service by telephone and the availability of in person appointments. I also discussed with the patient that there may be a patient responsible charge related to this service. The patient expressed understanding and agreed to proceed.  History of Present Illness: [name] is a 58 year old male with a history of COPD, chronic sinusitis, recurrent DVT and PE (on anticoagulation with Eliquis), coronary artery disease (status post stent, last cardiac cath from 12/2014 revealed two-vessel CAD, medical management recommended), tobacco abuse.  Pt c/o increase cough, wheezing with increased use of his inhalers x1 mth.  + SOB when he coughs.  Feels he is having "asthma flare." Wet cough productive of white phlegm.  No fever.  -using Albuterol inh 10-15 times a day and Advir 3 times a day.  Out of Albuterol neb treatment over 1 yr ago. -no sick exposure. Goes to grocery store 2 x a mth. -lives with a house mate.  He is not sick. -still smokes.  States he has cut back down to 5 cig/day.  Never quit but wants to.  Interested in trying the patches   Observations/Objective: No direct observation done as this was a telephone encounter  Assessment and Plan: 1. COPD exacerbation ([place]) Refill given on albuterol nebulizer solution which he can use as needed during this exacerbation so that he is not using his albuterol inhaler as much. 5 days of  prednisone and Z-Pak prescribed. Advised patient to use the Advair inhaler only twice a day. Add Incruse inhaler. Strongly advised to discontinue smoking. Patient requested that prescriptions be mailed to him even though I gave him the option of coming to the pharmacy today to pick up in person. - albuterol (PROAIR HFA) 108 (90 Base) MCG/ACT inhaler; Inhale 2 puffs into the lungs every 6 (six) hours as needed for wheezing or shortness of breath.  Dispense: 1 Inhaler; Refill: 3 - Fluticasone-Salmeterol (WIXELA INHUB) 250-50 MCG/DOSE AEPB; INHALE 1 PUFF INTO THE LUNGS TWICE DAILY  Dispense: 60 each; Refill: 2 - predniSONE (DELTASONE) 20 MG tablet; Take 2 tablets (40 mg total) by mouth daily with breakfast.  Dispense: 10 tablet; Refill: 0 - albuterol (PROVENTIL) (2.5 MG/3ML) 0.083% nebulizer solution; Take 3 mLs (2.5 mg total) by nebulization every 6 (six) hours as needed for wheezing or shortness of breath.  Dispense: 75 mL; Refill: 3 - azithromycin (ZITHROMAX) 250 MG tablet; 2 tabs PO x 1 day then 1 tab PO daily x 4 days  Dispense: 6 tablet; Refill: 0  2. Tobacco dependence Patient advised to quit smoking. Discussed health risks associated with smoking including lung and other types of cancers, chronic lung diseases and CV risks.. Pt readyto give trail of quitting.  Discussed methods to help quit including quitting cold [place], use of NRT, Chantix and Bupropion.  Patient wanting to give a trial of nicotine patches.  I will start him on the 14 mg patch which she will do for a month and then stepdown to the 7 mg.  I went over with him how to use the patch. About 5 minutes spent on counseling.  - nicotine (NICODERM CQ - DOSED IN MG/24 HOURS) 14 mg/24hr patch; Place 1 patch (14 mg total) onto the skin daily.  Dispense: 28 patch; Refill: 0 - nicotine (NICODERM CQ - DOSED IN MG/24 HR) 7 mg/24hr patch; Place 1 patch (7 mg total) onto the skin daily.  Dispense: 28 patch; Refill: 0 Follow Up  Instructions:   f/u with his PCP in 1 mth or sooner if symptoms do not improve. I discussed the assessment and treatment plan with the patient. The patient was provided an opportunity to ask questions and all were answered. The patient agreed with the plan and demonstrated an understanding of the instructions.   The patient was advised to call back or seek an in-person evaluation if the symptoms worsen or if the condition fails to improve as anticipated.  I provided 18 minutes of non-face-to-face time during this encounter.   Karle Plumber, MD

## 2018-08-01 ENCOUNTER — Ambulatory Visit: Payer: Medicare Other | Admitting: Family Medicine

## 2018-08-26 ENCOUNTER — Ambulatory Visit: Payer: Medicare Other | Attending: Family Medicine | Admitting: Family Medicine

## 2018-08-26 ENCOUNTER — Other Ambulatory Visit: Payer: Self-pay

## 2018-08-26 VITALS — BP 126/84 | HR 82 | Temp 98.1°F | Ht 74.0 in | Wt 194.0 lb

## 2018-08-26 DIAGNOSIS — G8929 Other chronic pain: Secondary | ICD-10-CM | POA: Diagnosis not present

## 2018-08-26 DIAGNOSIS — Z86711 Personal history of pulmonary embolism: Secondary | ICD-10-CM

## 2018-08-26 DIAGNOSIS — Z720 Tobacco use: Secondary | ICD-10-CM

## 2018-08-26 DIAGNOSIS — M546 Pain in thoracic spine: Secondary | ICD-10-CM | POA: Diagnosis not present

## 2018-08-26 DIAGNOSIS — J441 Chronic obstructive pulmonary disease with (acute) exacerbation: Secondary | ICD-10-CM

## 2018-08-26 DIAGNOSIS — F17210 Nicotine dependence, cigarettes, uncomplicated: Secondary | ICD-10-CM

## 2018-08-26 DIAGNOSIS — K219 Gastro-esophageal reflux disease without esophagitis: Secondary | ICD-10-CM

## 2018-08-26 DIAGNOSIS — I25119 Atherosclerotic heart disease of native coronary artery with unspecified angina pectoris: Secondary | ICD-10-CM

## 2018-08-26 DIAGNOSIS — Z86718 Personal history of other venous thrombosis and embolism: Secondary | ICD-10-CM

## 2018-08-26 DIAGNOSIS — I10 Essential (primary) hypertension: Secondary | ICD-10-CM

## 2018-08-26 MED ORDER — ATORVASTATIN CALCIUM 40 MG PO TABS: 40.0000 mg | ORAL_TABLET | Freq: Every day | ORAL | 6 refills | Status: DC

## 2018-08-26 MED ORDER — FLUTICASONE-SALMETEROL 250-50 MCG/DOSE IN AEPB: INHALATION_SPRAY | RESPIRATORY_TRACT | 2 refills | Status: DC

## 2018-08-26 MED ORDER — PANTOPRAZOLE SODIUM 40 MG PO TBEC: 40.0000 mg | DELAYED_RELEASE_TABLET | Freq: Every day | ORAL | 3 refills | Status: DC

## 2018-08-26 MED ORDER — APIXABAN 5 MG PO TABS: 5.0000 mg | ORAL_TABLET | Freq: Two times a day (BID) | ORAL | 6 refills | Status: DC

## 2018-08-26 MED ORDER — TIZANIDINE HCL 4 MG PO TABS: 4.0000 mg | ORAL_TABLET | Freq: Three times a day (TID) | ORAL | 3 refills | Status: DC | PRN

## 2018-08-26 MED ORDER — UMECLIDINIUM BROMIDE 62.5 MCG/INH IN AEPB: 1.0000 | INHALATION_SPRAY | Freq: Every day | RESPIRATORY_TRACT | 6 refills | Status: DC

## 2018-08-26 MED FILL — ELIQUIS 5 MG TABLET: 5 | 30 days supply | Qty: 60 | Fill #0

## 2018-08-26 MED FILL — FLUTICASONE-SALMETEROL 250-: 250-50 | 30 days supply | Qty: 60 | Fill #0

## 2018-08-26 MED FILL — ATORVASTATIN CALCIUM 40 MG: 40 | 30 days supply | Qty: 30 | Fill #0

## 2018-08-26 MED FILL — tiZANidine HCL 4 MG TABS: 4 | 20 days supply | Qty: 60 | Fill #0

## 2018-08-26 MED FILL — INCRUSE ELLIPTA 62.5 MCG IN: 62.5 | 30 days supply | Qty: 30 | Fill #0

## 2018-08-27 ENCOUNTER — Encounter: Payer: Self-pay | Admitting: Family Medicine

## 2018-08-27 MED ORDER — CETIRIZINE HCL 10 MG PO TABS: 10.0000 mg | ORAL_TABLET | Freq: Every day | ORAL | 1 refills | Status: DC

## 2018-08-27 MED ORDER — PREDNISONE 20 MG PO TABS: 40.0000 mg | ORAL_TABLET | Freq: Every day | ORAL | 0 refills | Status: DC

## 2018-08-27 MED ORDER — FUROSEMIDE 20 MG PO TABS: 20.0000 mg | ORAL_TABLET | Freq: Every day | ORAL | 3 refills | Status: DC

## 2018-08-27 MED FILL — FUROSEMIDE 20 MG TABS: 20 | 30 days supply | Qty: 30 | Fill #0

## 2018-08-27 MED FILL — predniSONE 20 MG TABS: 20 | 5 days supply | Qty: 10 | Fill #0

## 2018-08-27 NOTE — Progress Notes (Signed)
Subjective:  Patient ID: [name], male    DOB: 04-04-1960  Age: 58 y.o. MRN: [id number]  CC: Hypertension and Asthma   HPI [name]  is a 58 year old male with a history of COPD, chronic sinusitis, recurrent DVT and PE (on anticoagulation with Eliquis), coronary artery disease (status post stent, last cardiac cath from 12/2014 revealed two-vessel CAD, medical management recommended), CHF (EF 40-45% from 11/2017),tobacco abuse who presents today for follow-up visit.  He complains of one-month history of tiredness, weakness, decreased appetite, cough which makes him up at night and is described as dry; he has some shortness of breath and wheezing.  1 month ago he was treated for COPD exacerbation with prednisone and a Z-Pak which he states made him 60% better.  He denies fever.  Had to use his nebulizer today and endorses compliance with Advair, Incruse and Proventil. Continues to smoke 5 to 6 cigarettes/day; prescribed nicotine patches at his last visit which she has not been compliant with. Denies pedal edema, paroxysmal nocturnal dyspnea.  He endorses compliance with Eliquis with no complaints of bleeding, he has no chest pain and has been compliant with his statin and his antihypertensive. Past Medical History:  Diagnosis Date  . Asthma   . CAD (coronary artery disease)    a. 09/2013 NSTEMI/PCI: LM nl, LAD 40-50%, D1 100 (2.25 x 28 Vision BMS), LCX min irregs, RI 60-70, 30, RCA 40-50/50-33ms/p, EF 45-50%.  b. cath 12/2014 -occulded BMS in diag, 50% pro to mid LAD, 60% ramus, 30% RCA   . Chest pain 08/2015  . Chronic diastolic CHF (congestive heart failure) ([place])    a. 09/2014 EF 45-50% by LV gram;  b. 01/2014 Echo: EF 55-60%, Gr 1 DD.  Marland Kitchen Cocaine abuse ([place])   . COPD (chronic obstructive pulmonary disease) ([place])   . DVT (deep venous thrombosis) ([place])    "I had ~ 10 in each leg" (12/20/2016)  . Essential hypertension   . Hepatitis C    "clear free over a year now"  . High  cholesterol   . Myocardial infarction ([place]) ~ 2014  . Osteoarthritis    a. hands and toes.  . Pneumonia    "several times" (12/20/2016)  . Pulmonary embolism ([place])    a. 2012 - s/p IVC filter;  b. prev on eliquis - noncompliant.  . Shortness of breath dyspnea   . Sinus headache   . Squamous cell skin cancer 07/13/2016   "foot"  . Tobacco abuse     Past Surgical History:  Procedure Laterality Date  . CARDIAC CATHETERIZATION N/A 01/04/2015   Procedure: Left Heart Cath and Coronary Angiography;  Surgeon: Burnell Blanks, MD;  Location: [facility];  Service: Cardiovascular;  Laterality: N/A;  . CORONARY ANGIOPLASTY WITH STENT PLACEMENT  10/21/13   BMS to D1  . CYST EXCISION N/A 05/10/2016   Procedure: EXCISION OF SEBACEOUS CYST UPPER BACK;  Surgeon: Donnie Mesa, MD;  Location: [facility];  Service: General;  Laterality: N/A;  . FOOT SURGERY Right ~ 06/2016   "said there was skin cancer on it"  . LEFT HEART CATHETERIZATION WITH CORONARY ANGIOGRAM N/A 10/21/2013   Procedure: LEFT HEART CATHETERIZATION WITH CORONARY ANGIOGRAM;  Surgeon: Leonie Man, MD;  Location: [facility];  Service: Cardiovascular;  Laterality: N/A;  . VENA CAVA FILTER PLACEMENT  ~ 2007-~ 2017   "1 in my neck; 2 in my wrist"    Family History  Problem Relation Age of Onset  .  Asthma Mother   . Allergies Mother   . Allergies Sister   . Allergies Brother   . Deep vein thrombosis Brother        two brothers with recurrent DVT  . Heart attack Father        a.60s b. deceased in his 59s  . Coronary artery disease Father   . Colon cancer Neg Hx   . Liver cancer Neg Hx   . Esophageal cancer Neg Hx   . Colon polyps Neg Hx   . Rectal cancer Neg Hx   . Pancreatic cancer Neg Hx   . Stomach cancer Neg Hx     Allergies  Allergen Reactions  . No Known Allergies     Outpatient Medications Prior to Visit  Medication Sig Dispense Refill  . albuterol (PROAIR HFA) 108 (90 Base) MCG/ACT inhaler Inhale 2  puffs into the lungs every 6 (six) hours as needed for wheezing or shortness of breath. 1 Inhaler 3  . albuterol (PROVENTIL) (2.5 MG/3ML) 0.083% nebulizer solution Take 3 mLs (2.5 mg total) by nebulization every 6 (six) hours as needed for wheezing or shortness of breath. 75 mL 3  . ergocalciferol (DRISDOL) 50000 units capsule Take 1 capsule (50,000 Units total) by mouth once a week. 9 capsule 1  . fluticasone (FLONASE) 50 MCG/ACT nasal spray Place 2 sprays into both nostrils 2 (two) times daily. 16 g 2  . furosemide (LASIX) 20 MG tablet Take 1 tablet (20 mg total) by mouth daily. 30 tablet 3  . gabapentin (NEURONTIN) 300 MG capsule Take 1 capsule (300 mg total) by mouth 2 (two) times daily. 60 capsule 3  . nicotine (NICODERM CQ - DOSED IN MG/24 HOURS) 14 mg/24hr patch Place 1 patch (14 mg total) onto the skin daily. 28 patch 0  . nicotine (NICODERM CQ - DOSED IN MG/24 HR) 7 mg/24hr patch Place 1 patch (7 mg total) onto the skin daily. 28 patch 0  . traMADol (ULTRAM) 50 MG tablet Take 50 mg by mouth every 12 (twelve) hours as needed.    Marland Kitchen apixaban (ELIQUIS) 5 MG TABS tablet Take 1 tablet (5 mg total) by mouth 2 (two) times daily. 60 tablet 3  . atorvastatin (LIPITOR) 40 MG tablet Take 1 tablet (40 mg total) by mouth daily at 6 PM. 30 tablet 3  . Fluticasone-Salmeterol (WIXELA INHUB) 250-50 MCG/DOSE AEPB INHALE 1 PUFF INTO THE LUNGS TWICE DAILY 60 each 2  . pantoprazole (PROTONIX) 40 MG tablet Take 1 tablet (40 mg total) by mouth daily. 30 tablet 3  . tiZANidine (ZANAFLEX) 4 MG tablet Take 1 tablet (4 mg total) by mouth every 8 (eight) hours as needed for muscle spasms. 60 tablet 1  . umeclidinium bromide (INCRUSE ELLIPTA) 62.5 MCG/INH AEPB Inhale 1 puff into the lungs daily. 1 each 6  . azithromycin (ZITHROMAX) 250 MG tablet 2 tabs PO x 1 day then 1 tab PO daily x 4 days (Patient not taking: Reported on 08/26/2018) 6 tablet 0  . predniSONE (DELTASONE) 20 MG tablet Take 2 tablets (40 mg total) by mouth  daily with breakfast. (Patient not taking: Reported on 08/26/2018) 10 tablet 0   No facility-administered medications prior to visit.      ROS Review of Systems  Constitutional: Positive for appetite change. Negative for activity change.  HENT: Negative for sinus pressure and sore throat.   Eyes: Negative for visual disturbance.  Respiratory: Positive for cough, shortness of breath and wheezing. Negative for chest tightness.  Cardiovascular: Negative for chest pain and leg swelling.  Gastrointestinal: Negative for abdominal distention, abdominal pain, constipation and diarrhea.  Endocrine: Negative.   Genitourinary: Negative for dysuria.  Musculoskeletal: Negative for joint swelling and myalgias.  Skin: Negative for rash.  Allergic/Immunologic: Negative.   Neurological: Negative for weakness, light-headedness and numbness.  Psychiatric/Behavioral: Negative for dysphoric mood and suicidal ideas.    Objective:  BP 126/84   Pulse 82   Temp 98.1 F (36.7 C) (Oral)   Ht 6\' 2"  (1.88 m)   Wt 194 lb (88 kg)   SpO2 99%   BMI 24.91 kg/m   BP/Weight 08/26/2018 05/01/2018 07/27/7739  Systolic BP 287 867 672  Diastolic BP 84 88 86  Wt. (Lbs) 194 191 191.25  BMI 24.91 24.52 24.56      Physical Exam Constitutional:      Appearance: He is well-developed.  Neck:     Comments: No JVD Cardiovascular:     Rate and Rhythm: Normal rate.     Heart sounds: Normal heart sounds. No murmur.  Pulmonary:     Effort: Pulmonary effort is normal.     Breath sounds: Wheezing present. No rales.  Chest:     Chest wall: No tenderness.  Abdominal:     General: Bowel sounds are normal. There is no distension.     Palpations: Abdomen is soft. There is no mass.     Tenderness: There is no abdominal tenderness.  Musculoskeletal: Normal range of motion.  Neurological:     Mental Status: He is alert and oriented to person, place, and time.     CMP Latest Ref Rng & Units 03/06/2018 06/30/2017 04/10/2017   Glucose mg/dL CANCELED 115(H) 99  BUN 6 - 24 mg/dL 16 7 26(H)  Creatinine 0.76 - 1.27 mg/dL 0.80 0.69 0.88  Sodium 134 - 144 mmol/L 142 137 137  Potassium mmol/L CANCELED 3.6 4.0  Chloride 96 - 106 mmol/L 105 101 105  CO2 20 - 29 mmol/L 17(L) 25 23  Calcium 8.7 - 10.2 mg/dL 9.8 9.3 8.5(L)  Total Protein 6.0 - 8.5 g/dL 7.9 - -  Total Bilirubin 0.0 - 1.2 mg/dL 0.3 - -  Alkaline Phos 39 - 117 IU/L 143(H) - -  AST 0 - 40 IU/L 39 - -  ALT 0 - 44 IU/L 34 - -    Lipid Panel     Component Value Date/Time   CHOL 206 (H) 08/03/2016 1244   TRIG 98 08/03/2016 1244   HDL 57 08/03/2016 1244   CHOLHDL 3.6 08/03/2016 1244   CHOLHDL 3.1 01/01/2015 0208   VLDL 12 01/01/2015 0208   LDLCALC 129 (H) 08/03/2016 1244    CBC    Component Value Date/Time   WBC 7.1 06/30/2017 1259   RBC 4.89 06/30/2017 1259   HGB 15.1 06/30/2017 1259   HGB 14.6 08/03/2016 1244   HCT 46.1 06/30/2017 1259   HCT 45.7 08/03/2016 1244   PLT 100 (L) 06/30/2017 1259   PLT 172 08/03/2016 1244   MCV 94.3 06/30/2017 1259   MCV 96 08/03/2016 1244   MCH 30.9 06/30/2017 1259   MCHC 32.8 06/30/2017 1259   RDW 14.0 06/30/2017 1259   RDW 15.7 (H) 08/03/2016 1244   LYMPHSABS 1.3 06/30/2017 1259   LYMPHSABS 1.4 08/03/2016 1244   MONOABS 0.9 06/30/2017 1259   EOSABS 0.2 06/30/2017 1259   EOSABS 0.3 08/03/2016 1244   BASOSABS 0.0 06/30/2017 1259   BASOSABS 0.0 08/03/2016 1244    Lab Results  Component Value Date   HGBA1C 5.4 05/16/2016    Assessment & Plan:   1. Chronic right-sided thoracic back pain Stable - tiZANidine (ZANAFLEX) 4 MG tablet; Take 1 tablet (4 mg total) by mouth every 8 (eight) hours as needed for muscle spasms.  Dispense: 60 tablet; Refill: 3  2. History of pulmonary embolism On chronic anticoagulation with Eliquis - apixaban (ELIQUIS) 5 MG TABS tablet; Take 1 tablet (5 mg total) by mouth 2 (two) times daily.  Dispense: 60 tablet; Refill: 6  3. History of recurrent deep vein thrombosis (DVT)  See #2 above - apixaban (ELIQUIS) 5 MG TABS tablet; Take 1 tablet (5 mg total) by mouth 2 (two) times daily.  Dispense: 60 tablet; Refill: 6  4. Coronary artery disease involving native coronary artery of native heart with angina pectoris (HCC) Asymptomatic Risk factor modification including smoking cessation - atorvastatin (LIPITOR) 40 MG tablet; Take 1 tablet (40 mg total) by mouth daily at 6 PM.  Dispense: 30 tablet; Refill: 6  5. COPD exacerbation (HCC) Recurrent exacerbation Treated with Z-Pak and prednisone 1 month ago Repeat short course of prednisone today, will add Zyrtec Advised to use nebulizer when he gets home as no nebulizer policy in the clinic at this time due to ongoing COVID-19 pandemic We will evaluate for pneumonia Present to the ED if symptoms worsen Advised looking cessation will be beneficial - DG Chest 2 View; Future - Fluticasone-Salmeterol (WIXELA INHUB) 250-50 MCG/DOSE AEPB; INHALE 1 PUFF INTO THE LUNGS TWICE DAILY  Dispense: 60 each; Refill: 2  6. Gastroesophageal reflux disease without esophagitis Stable - pantoprazole (PROTONIX) 40 MG tablet; Take 1 tablet (40 mg total) by mouth daily.  Dispense: 30 tablet; Refill: 3  7. Tobacco abuse Spent 3 minutes counseling on cessation and use of nicotine replacement therapy however he is not ready to quit at this time. Advised to comply with nicotine patches.   Meds ordered this encounter  Medications  . tiZANidine (ZANAFLEX) 4 MG tablet    Sig: Take 1 tablet (4 mg total) by mouth every 8 (eight) hours as needed for muscle spasms.    Dispense:  60 tablet    Refill:  3  . apixaban (ELIQUIS) 5 MG TABS tablet    Sig: Take 1 tablet (5 mg total) by mouth 2 (two) times daily.    Dispense:  60 tablet    Refill:  6  . atorvastatin (LIPITOR) 40 MG tablet    Sig: Take 1 tablet (40 mg total) by mouth daily at 6 PM.    Dispense:  30 tablet    Refill:  6  . Fluticasone-Salmeterol (WIXELA INHUB) 250-50 MCG/DOSE AEPB     Sig: INHALE 1 PUFF INTO THE LUNGS TWICE DAILY    Dispense:  60 each    Refill:  2  . pantoprazole (PROTONIX) 40 MG tablet    Sig: Take 1 tablet (40 mg total) by mouth daily.    Dispense:  30 tablet    Refill:  3  . umeclidinium bromide (INCRUSE ELLIPTA) 62.5 MCG/INH AEPB    Sig: Inhale 1 puff into the lungs daily.    Dispense:  1 each    Refill:  6    Follow-up: Return if symptoms worsen or fail to improve.    Addendum: I spoke with the patient on the phone on 08/27/2018 by 12 12 PM and advised him of the need to have his chest x-ray done.  Prescriptions for Lasix, prednisone sent to his pharmacy as  well.   Charlott Rakes, MD, FAAFP. [facility] and [facility] [place], [place]   08/27/2018, 11:56 AM

## 2018-08-28 MED FILL — PANTOPRAZOLE SOD DR 40 MG T: 40 | 30 days supply | Qty: 30 | Fill #0

## 2018-08-30 ENCOUNTER — Ambulatory Visit (HOSPITAL_COMMUNITY)
Admission: RE | Admit: 2018-08-30 | Discharge: 2018-08-30 | Disposition: A | Discharge status: Home / Self Care | Payer: Medicare Other | Source: Ambulatory Visit | Attending: Family Medicine | Admitting: Family Medicine

## 2018-08-30 ENCOUNTER — Other Ambulatory Visit: Payer: Self-pay

## 2018-08-30 DIAGNOSIS — J441 Chronic obstructive pulmonary disease with (acute) exacerbation: Secondary | ICD-10-CM | POA: Diagnosis not present

## 2018-08-30 MED FILL — PROAIR HFA 90 MCG INHALER: 108 (90 BAS | 25 days supply | Qty: 9 | Fill #1

## 2018-09-05 ENCOUNTER — Telehealth: Payer: Self-pay

## 2018-09-05 NOTE — Telephone Encounter (Signed)
Patient name and DOB has been verified Patient was informed of lab results. Patient had no questions.  

## 2018-09-05 NOTE — Telephone Encounter (Signed)
-----   Message from Charlott Rakes, MD sent at 09/03/2018  1:10 PM EDT ----- Chest x-ray is negative for pneumonia but reveals arthritis of the spine.  Advised to work on quitting smoking

## 2018-09-30 ENCOUNTER — Other Ambulatory Visit: Payer: Self-pay | Admitting: Family Medicine

## 2018-09-30 DIAGNOSIS — J441 Chronic obstructive pulmonary disease with (acute) exacerbation: Secondary | ICD-10-CM

## 2018-09-30 MED FILL — FLUTICASONE-SALMETEROL 250-: 250-50 | 30 days supply | Qty: 60 | Fill #1

## 2018-09-30 MED FILL — ELIQUIS 5 MG TABLET: 5 | 30 days supply | Qty: 60 | Fill #1

## 2018-09-30 MED FILL — tiZANidine HCL 4 MG TABS: 4 | 20 days supply | Qty: 60 | Fill #1

## 2018-09-30 MED FILL — ATORVASTATIN CALCIUM 40 MG: 40 | 30 days supply | Qty: 30 | Fill #1

## 2018-09-30 MED FILL — PROAIR HFA 90 MCG INHALER: 108 (90 BAS | 25 days supply | Qty: 9 | Fill #2

## 2018-10-18 IMAGING — DX DG CHEST 2V
2 series · 2 of 2 positions shown · non-contrast
Comparison: Radiographs 2 days prior 12/18/2016, additional priors

CLINICAL DATA: Cough, shortness of breath and wheezing tonight.

EXAM:
CHEST  2 VIEW

[chest pa]
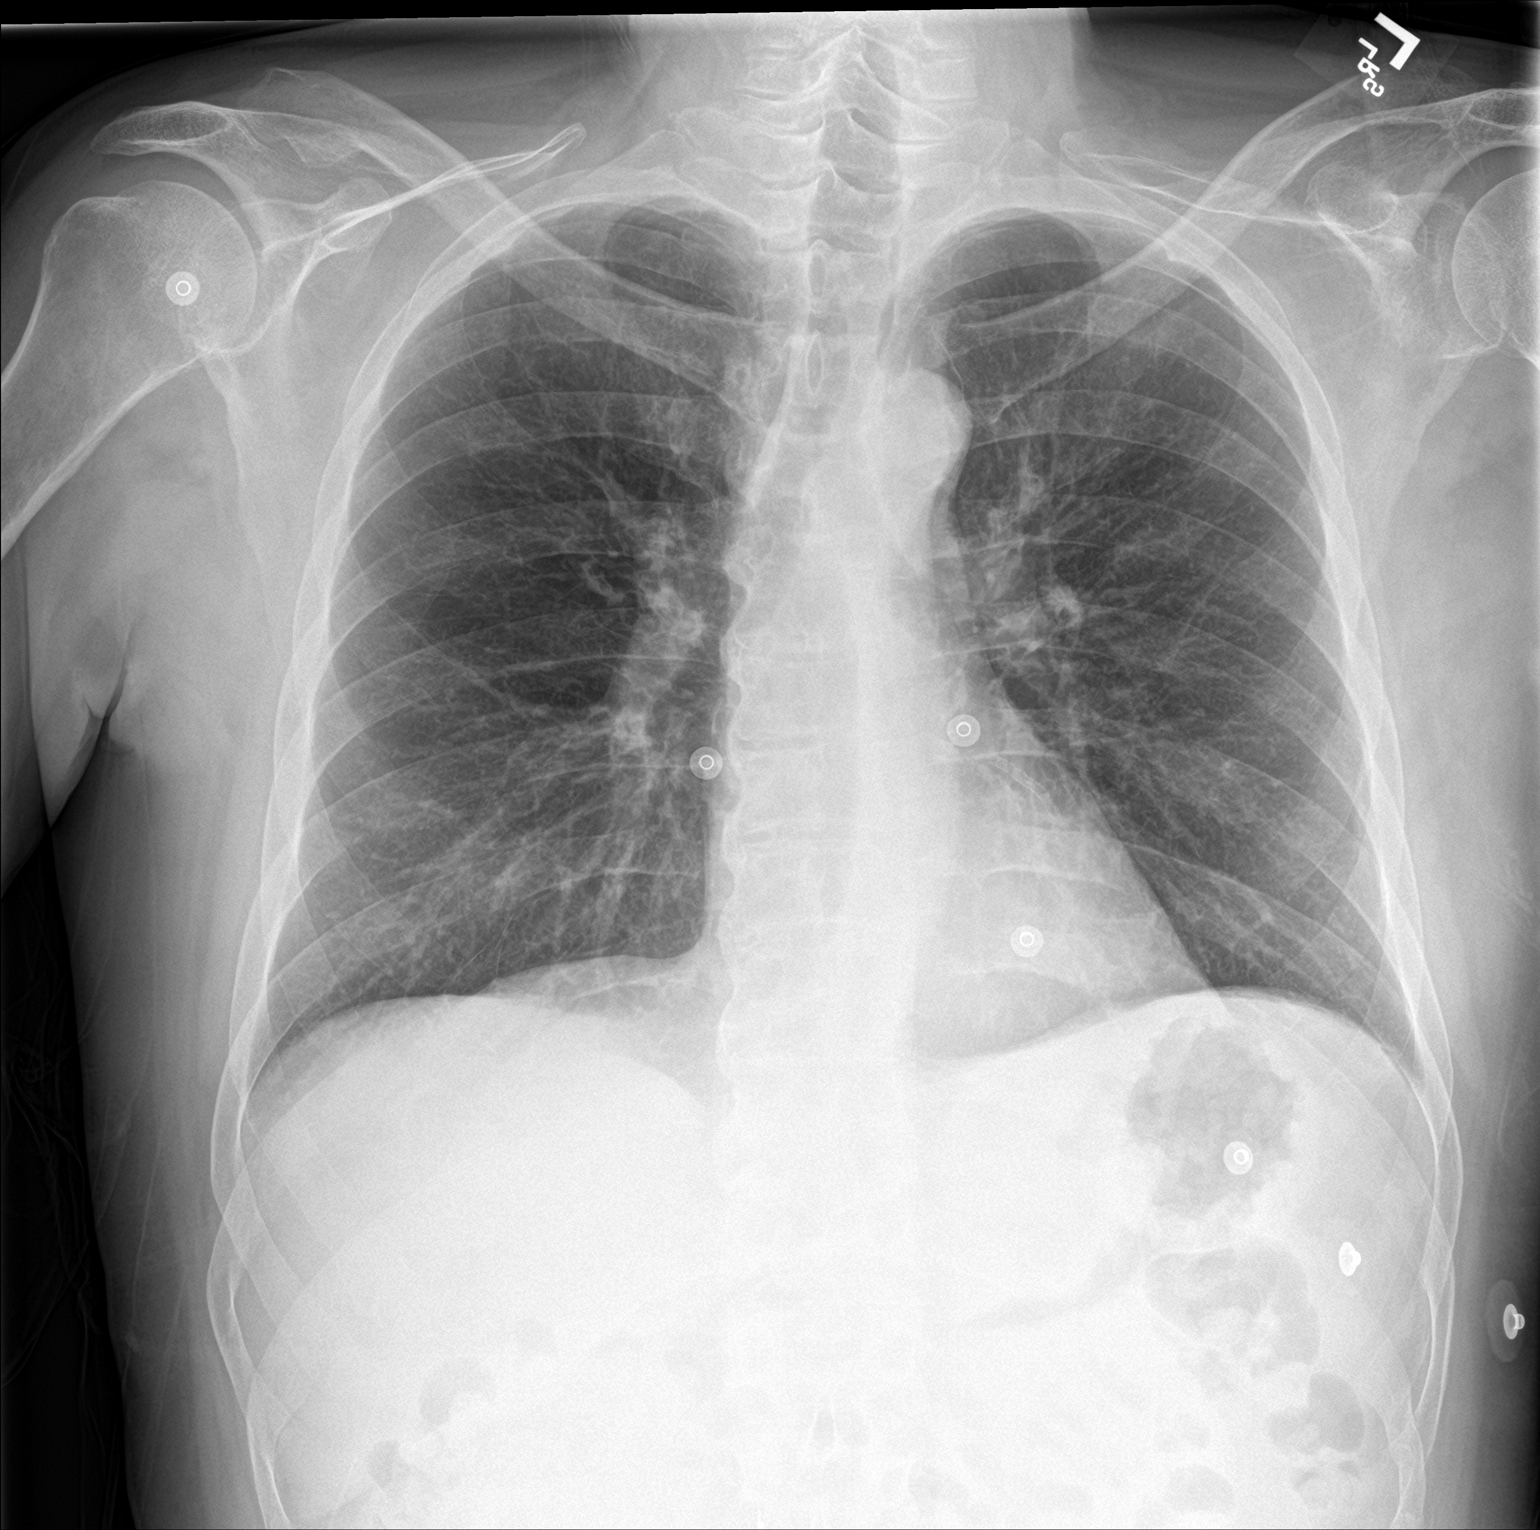

[chest lat]
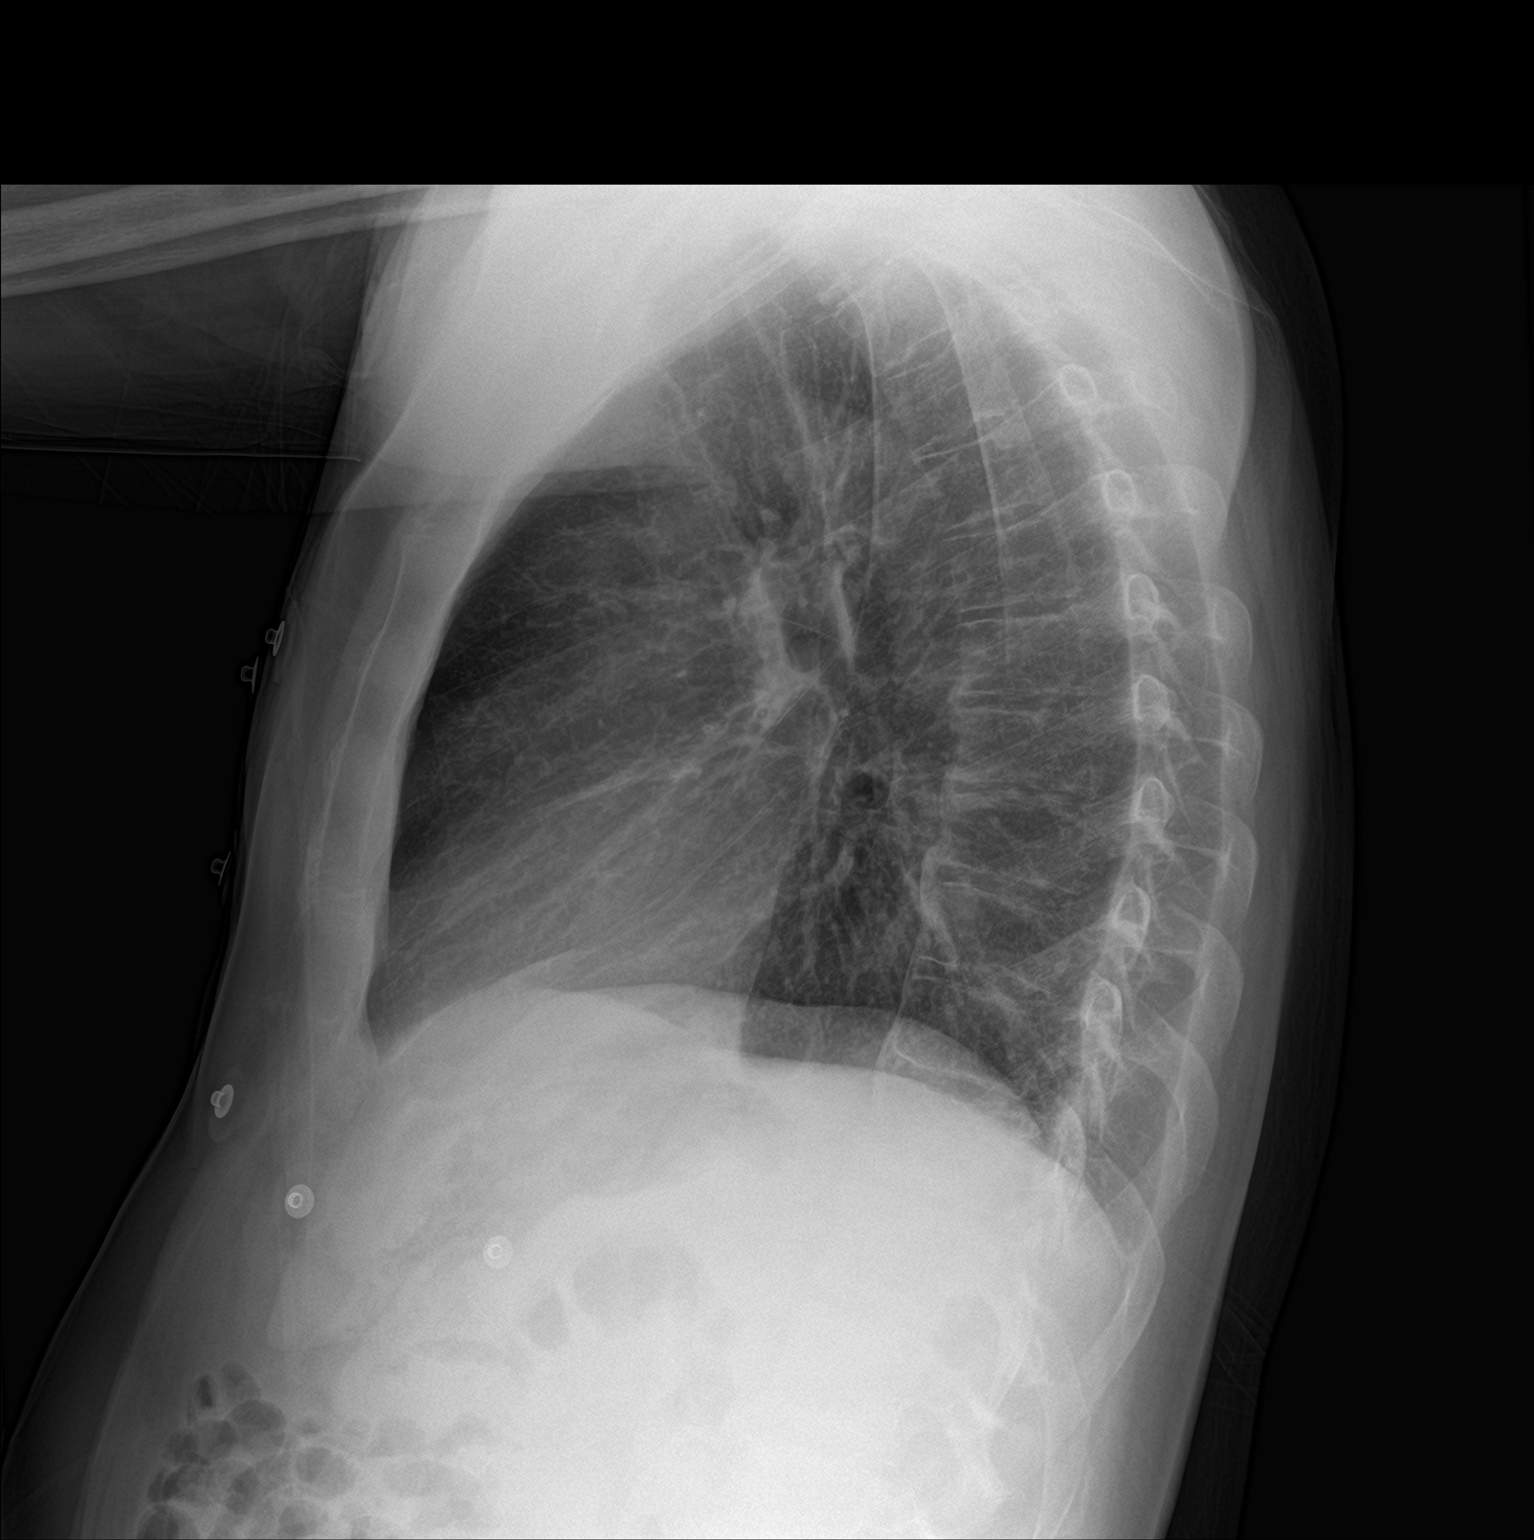

[2 of 2 positions shown; findings below may reference images not displayed]

FINDINGS: The cardiomediastinal contours are normal. Stable chronic bronchial
thickening. Pulmonary vasculature is normal. No consolidation,
pleural effusion, or pneumothorax. No acute osseous abnormalities
are seen.
IMPRESSION: Stable chronic bronchial thickening without acute abnormality.

## 2018-12-03 ENCOUNTER — Ambulatory Visit: Payer: Medicare Other | Admitting: Family Medicine

## 2018-12-27 MED FILL — tiZANidine HCL 4 MG TABS: 4 | 40 days supply | Qty: 120 | Fill #2

## 2018-12-27 MED FILL — FLUTICASONE-SALMETEROL 250-: 250-50 | 30 days supply | Qty: 60 | Fill #2

## 2018-12-27 MED FILL — PANTOPRAZOLE SOD DR 40 MG T: 40 | 90 days supply | Qty: 90 | Fill #1

## 2018-12-27 MED FILL — ELIQUIS 5 MG TABLET: 5 | 90 days supply | Qty: 180 | Fill #2

## 2018-12-27 MED FILL — FUROSEMIDE 20 MG TABS: 20 | 90 days supply | Qty: 90 | Fill #1

## 2018-12-27 MED FILL — ATORVASTATIN CALCIUM 40 MG: 40 | 90 days supply | Qty: 90 | Fill #2

## 2018-12-27 MED FILL — PROAIR HFA 90 MCG INHALER: 108 (90 BAS | 25 days supply | Qty: 9 | Fill #3

## 2019-01-14 ENCOUNTER — Other Ambulatory Visit: Payer: Self-pay

## 2019-01-14 ENCOUNTER — Ambulatory Visit: Payer: Medicare Other | Attending: Family Medicine | Admitting: Family Medicine

## 2019-01-14 DIAGNOSIS — J441 Chronic obstructive pulmonary disease with (acute) exacerbation: Secondary | ICD-10-CM

## 2019-01-14 DIAGNOSIS — G629 Polyneuropathy, unspecified: Secondary | ICD-10-CM

## 2019-01-14 DIAGNOSIS — Z86718 Personal history of other venous thrombosis and embolism: Secondary | ICD-10-CM

## 2019-01-14 DIAGNOSIS — Z86711 Personal history of pulmonary embolism: Secondary | ICD-10-CM

## 2019-01-14 DIAGNOSIS — K219 Gastro-esophageal reflux disease without esophagitis: Secondary | ICD-10-CM

## 2019-01-14 DIAGNOSIS — I25119 Atherosclerotic heart disease of native coronary artery with unspecified angina pectoris: Secondary | ICD-10-CM | POA: Diagnosis not present

## 2019-01-14 MED ORDER — GABAPENTIN 300 MG PO CAPS: 300.0000 mg | ORAL_CAPSULE | Freq: Two times a day (BID) | ORAL | 3 refills | Status: DC

## 2019-01-14 MED ORDER — APIXABAN 5 MG PO TABS: 5.0000 mg | ORAL_TABLET | Freq: Two times a day (BID) | ORAL | 6 refills | Status: DC

## 2019-01-14 MED ORDER — CETIRIZINE HCL 10 MG PO TABS: 10.0000 mg | ORAL_TABLET | Freq: Every day | ORAL | 1 refills | Status: DC

## 2019-01-14 MED ORDER — ALBUTEROL SULFATE (2.5 MG/3ML) 0.083% IN NEBU: 2.5000 mg | INHALATION_SOLUTION | Freq: Four times a day (QID) | RESPIRATORY_TRACT | 3 refills | Status: DC | PRN

## 2019-01-14 MED ORDER — FLUTICASONE-SALMETEROL 250-50 MCG/DOSE IN AEPB: INHALATION_SPRAY | RESPIRATORY_TRACT | 2 refills | Status: DC

## 2019-01-14 MED ORDER — ALBUTEROL SULFATE HFA 108 (90 BASE) MCG/ACT IN AERS: 2.0000 | INHALATION_SPRAY | Freq: Four times a day (QID) | RESPIRATORY_TRACT | 6 refills | Status: DC | PRN

## 2019-01-14 MED ORDER — UMECLIDINIUM BROMIDE 62.5 MCG/INH IN AEPB: 1.0000 | INHALATION_SPRAY | Freq: Every day | RESPIRATORY_TRACT | 6 refills | Status: DC

## 2019-01-14 MED ORDER — PANTOPRAZOLE SODIUM 40 MG PO TBEC: 40.0000 mg | DELAYED_RELEASE_TABLET | Freq: Every day | ORAL | 3 refills | Status: DC

## 2019-01-14 MED ORDER — ATORVASTATIN CALCIUM 40 MG PO TABS: 40.0000 mg | ORAL_TABLET | Freq: Every day | ORAL | 6 refills | Status: DC

## 2019-01-14 MED ORDER — FUROSEMIDE 20 MG PO TABS: 20.0000 mg | ORAL_TABLET | Freq: Every day | ORAL | 3 refills | Status: DC

## 2019-01-14 MED FILL — PROAIR HFA 90 MCG INHALER: 108 (90 BAS | 20 days supply | Qty: 9 | Fill #0

## 2019-01-14 MED FILL — INCRUSE ELLIPTA 62.5 MCG IN: 62.5 | 25 days supply | Qty: 7 | Fill #0

## 2019-01-14 MED FILL — ALBUTEROL SUL 2.5 MG/3 ML S: (2.5 MG/3ML | 75 days supply | Qty: 75 | Fill #0

## 2019-01-14 MED FILL — GABAPENTIN 300 MG CAPSULE: 300 | 30 days supply | Qty: 60 | Fill #0

## 2019-01-14 NOTE — Progress Notes (Signed)
Virtual Visit via Telephone Note  I connected with [name], on 01/14/2019 at 4:17 PM by telephone due to the COVID-19 pandemic and verified that I am speaking with the correct person using two identifiers.   Consent: I discussed the limitations, risks, security and privacy concerns of performing an evaluation and management service by telephone and the availability of in person appointments. I also discussed with the patient that there may be a patient responsible charge related to this service. The patient expressed understanding and agreed to proceed.   Location of Patient: Home  Location of Provider: Clinic   Persons participating in Telemedicine visit: Rosbel Buckner Farrington-CMA Dr. Felecia Shelling     History of Present Illness: [name]  is a 58 year old male with a history of COPD, chronic sinusitis, recurrent DVT and PE (on anticoagulation with Eliquis), coronary artery disease (status post stent, last cardiac cath from 12/2014 revealed two-vessel CAD, medical management recommended), CHF (EF 40-45% from 11/2017),tobacco abuse who presents today for follow-up visit.   States he is doing good and has had energy while taking his pills regularly His COPD/Asthma has been stable with no Hospital visits in close to two years.  He denies the presence of dyspnea, wheezing, chest pains and is compliant with his Eliquis.  Denies bruising or bleeding with Eliquis but noticed yesterday when he wiped after a bowel movement he saw blood on the toilet paper.  He endorsed being constipated though. With regards to his reflux he still needs his Protonix due to ongoing GERD symptoms. He is due for labs and flu shot.  Past Medical History:  Diagnosis Date  . Asthma   . CAD (coronary artery disease)    a. 09/2013 NSTEMI/PCI: LM nl, LAD 40-50%, D1 100 (2.25 x 28 Vision BMS), LCX min irregs, RI 60-70, 30, RCA 40-50/50-42m/p, EF 45-50%.  b. cath 12/2014 -occulded BMS in  diag, 50% pro to mid LAD, 60% ramus, 30% RCA   . Chest pain 08/2015  . Chronic diastolic CHF (congestive heart failure) ([place]    a. 09/2014 EF 45-50% by LV gram;  b. 01/2014 Echo: EF 55-60%, Gr 1 DD.  .Marland KitchenCocaine abuse ([place]   . COPD (chronic obstructive pulmonary disease) ([place]   . DVT (deep venous thrombosis) ([place]    "I had ~ 10 in each leg" (12/20/2016)  . Essential hypertension   . Hepatitis C    "clear free over a year now"  . High cholesterol   . Myocardial infarction ([place] ~ 2014  . Osteoarthritis    a. hands and toes.  . Pneumonia    "several times" (12/20/2016)  . Pulmonary embolism ([place]    a. 2012 - s/p IVC filter;  b. prev on eliquis - noncompliant.  . Shortness of breath dyspnea   . Sinus headache   . Squamous cell skin cancer 07/13/2016   "foot"  . Tobacco abuse    Allergies  Allergen Reactions  . No Known Allergies     Current Outpatient Medications on File Prior to Visit  Medication Sig Dispense Refill  . albuterol (PROAIR HFA) 108 (90 Base) MCG/ACT inhaler Inhale 2 puffs into the lungs every 6 (six) hours as needed for wheezing or shortness of breath. 1 Inhaler 3  . albuterol (PROVENTIL) (2.5 MG/3ML) 0.083% nebulizer solution Take 3 mLs (2.5 mg total) by nebulization every 6 (six) hours as needed for wheezing or shortness of breath. 75 mL 3  . apixaban (ELIQUIS) 5 MG TABS tablet  Take 1 tablet (5 mg total) by mouth 2 (two) times daily. 60 tablet 6  . atorvastatin (LIPITOR) 40 MG tablet Take 1 tablet (40 mg total) by mouth daily at 6 PM. 30 tablet 6  . cetirizine (ZYRTEC) 10 MG tablet Take 1 tablet (10 mg total) by mouth daily. 30 tablet 1  . ergocalciferol (DRISDOL) 50000 units capsule Take 1 capsule (50,000 Units total) by mouth once a week. 9 capsule 1  . fluticasone (FLONASE) 50 MCG/ACT nasal spray Place 2 sprays into both nostrils 2 (two) times daily. 16 g 2  . Fluticasone-Salmeterol (WIXELA INHUB) 250-50 MCG/DOSE AEPB INHALE 1 PUFF INTO THE LUNGS TWICE DAILY 60  each 2  . furosemide (LASIX) 20 MG tablet Take 1 tablet (20 mg total) by mouth daily. 30 tablet 3  . gabapentin (NEURONTIN) 300 MG capsule Take 1 capsule (300 mg total) by mouth 2 (two) times daily. 60 capsule 3  . pantoprazole (PROTONIX) 40 MG tablet Take 1 tablet (40 mg total) by mouth daily. 30 tablet 3  . tiZANidine (ZANAFLEX) 4 MG tablet Take 1 tablet (4 mg total) by mouth every 8 (eight) hours as needed for muscle spasms. 60 tablet 3  . traMADol (ULTRAM) 50 MG tablet Take 50 mg by mouth every 12 (twelve) hours as needed.    . umeclidinium bromide (INCRUSE ELLIPTA) 62.5 MCG/INH AEPB Inhale 1 puff into the lungs daily. 1 each 6  . azithromycin (ZITHROMAX) 250 MG tablet 2 tabs PO x 1 day then 1 tab PO daily x 4 days (Patient not taking: Reported on 08/26/2018) 6 tablet 0  . nicotine (NICODERM CQ - DOSED IN MG/24 HOURS) 14 mg/24hr patch Place 1 patch (14 mg total) onto the skin daily. (Patient not taking: Reported on 01/14/2019) 28 patch 0  . nicotine (NICODERM CQ - DOSED IN MG/24 HR) 7 mg/24hr patch Place 1 patch (7 mg total) onto the skin daily. (Patient not taking: Reported on 01/14/2019) 28 patch 0  . predniSONE (DELTASONE) 20 MG tablet Take 2 tablets (40 mg total) by mouth daily with breakfast. (Patient not taking: Reported on 01/14/2019) 10 tablet 0   No current facility-administered medications on file prior to visit.     Observations/Objective: Awake, alert, oriented x3 Not in acute distress  Assessment and Plan: 1. History of pulmonary embolism Currently on chronic anticoagulation due to recurrent thromboembolic events - apixaban (ELIQUIS) 5 MG TABS tablet; Take 1 tablet (5 mg total) by mouth 2 (two) times daily.  Dispense: 60 tablet; Refill: 6  2. History of recurrent deep vein thrombosis (DVT) See #1 above - apixaban (ELIQUIS) 5 MG TABS tablet; Take 1 tablet (5 mg total) by mouth 2 (two) times daily.  Dispense: 60 tablet; Refill: 6  3. COPD exacerbation (HCC) Stable with no  exacerbations - albuterol (PROVENTIL) (2.5 MG/3ML) 0.083% nebulizer solution; Take 3 mLs (2.5 mg total) by nebulization every 6 (six) hours as needed for wheezing or shortness of breath.  Dispense: 75 mL; Refill: 3 - albuterol (PROAIR HFA) 108 (90 Base) MCG/ACT inhaler; Inhale 2 puffs into the lungs every 6 (six) hours as needed for wheezing or shortness of breath.  Dispense: 18 g; Refill: 6 - Fluticasone-Salmeterol (WIXELA INHUB) 250-50 MCG/DOSE AEPB; INHALE 1 PUFF INTO THE LUNGS TWICE DAILY  Dispense: 60 each; Refill: 2  4. Coronary artery disease involving native coronary artery of native heart with angina pectoris (HCC) Asymptomatic Risk factor modification including smoking cessation - atorvastatin (LIPITOR) 40 MG tablet; Take 1 tablet (40  mg total) by mouth daily at 6 PM.  Dispense: 30 tablet; Refill: 6 - CMP14+EGFR; Future - Lipid panel; Future  5. Gastroesophageal reflux disease without esophagitis Controlled - pantoprazole (PROTONIX) 40 MG tablet; Take 1 tablet (40 mg total) by mouth daily.  Dispense: 30 tablet; Refill: 3  6. Neuropathy Stable - gabapentin (NEURONTIN) 300 MG capsule; Take 1 capsule (300 mg total) by mouth 2 (two) times daily.  Dispense: 60 capsule; Refill: 3   Follow Up Instructions: Return in about 3 months (around 04/16/2019) for Chronic care conditions.    I discussed the assessment and treatment plan with the patient. The patient was provided an opportunity to ask questions and all were answered. The patient agreed with the plan and demonstrated an understanding of the instructions.   The patient was advised to call back or seek an in-person evaluation if the symptoms worsen or if the condition fails to improve as anticipated.     I provided 15 minutes total of non-face-to-face time during this encounter including median intraservice time, reviewing previous notes, labs, imaging, medications, management and patient verbalized  understanding.     Charlott Rakes, MD, FAAFP. [facility] and [place], [place]   01/14/2019, 4:17 PM

## 2019-01-14 NOTE — Progress Notes (Signed)
Patient has been called and DOB has been verified. Patient has been screened and transferred to PCP to start phone visit.     

## 2019-01-21 ENCOUNTER — Other Ambulatory Visit: Payer: Self-pay

## 2019-01-21 ENCOUNTER — Ambulatory Visit: Payer: Medicare Other | Attending: Family Medicine

## 2019-01-21 ENCOUNTER — Ambulatory Visit (HOSPITAL_BASED_OUTPATIENT_CLINIC_OR_DEPARTMENT_OTHER): Payer: Medicare Other | Admitting: Pharmacist

## 2019-01-21 DIAGNOSIS — I25119 Atherosclerotic heart disease of native coronary artery with unspecified angina pectoris: Secondary | ICD-10-CM

## 2019-01-21 DIAGNOSIS — Z23 Encounter for immunization: Secondary | ICD-10-CM | POA: Diagnosis not present

## 2019-01-21 NOTE — Progress Notes (Signed)
Patient presents for vaccination against influenza per orders of Dr. Newlin. Consent given. Counseling provided. No contraindications exists. Vaccine administered without incident.   

## 2019-01-22 LAB — CMP14+EGFR
ALT: 16 IU/L (ref 0–44)
AST: 20 IU/L (ref 0–40)
Albumin/Globulin Ratio: 0.9 — ABNORMAL LOW (ref 1.2–2.2)
Albumin: 3.5 g/dL — ABNORMAL LOW (ref 3.8–4.9)
Alkaline Phosphatase: 123 IU/L — ABNORMAL HIGH (ref 39–117)
BUN/Creatinine Ratio: 10 (ref 9–20)
BUN: 7 mg/dL (ref 6–24)
Bilirubin Total: 0.3 mg/dL (ref 0.0–1.2)
CO2: 24 mmol/L (ref 20–29)
Calcium: 9.3 mg/dL (ref 8.7–10.2)
Chloride: 105 mmol/L (ref 96–106)
Creatinine, Ser: 0.7 mg/dL — ABNORMAL LOW (ref 0.76–1.27)
GFR calc Af Amer: 120 mL/min/{1.73_m2} (ref >59)
GFR calc non Af Amer: 104 mL/min/{1.73_m2} (ref >59)
Globulin, Total: 4 g/dL (ref 1.5–4.5)
Glucose: 92 mg/dL (ref 65–99)
Potassium: 3.8 mmol/L (ref 3.5–5.2)
Sodium: 142 mmol/L (ref 134–144)
Total Protein: 7.5 g/dL (ref 6.0–8.5)

## 2019-01-22 LAB — LIPID PANEL
Chol/HDL Ratio: 2.5 ratio (ref 0.0–5.0)
Cholesterol, Total: 145 mg/dL (ref 100–199)
HDL: 57 mg/dL (ref >39)
LDL Chol Calc (NIH): 72 mg/dL (ref 0–99)
Triglycerides: 87 mg/dL (ref 0–149)
VLDL Cholesterol Cal: 16 mg/dL (ref 5–40)

## 2019-01-23 ENCOUNTER — Telehealth: Payer: Self-pay

## 2019-01-23 NOTE — Telephone Encounter (Signed)
Patient name and DOB has been verified Patient was informed of lab results. Patient had no questions.  

## 2019-01-23 NOTE — Telephone Encounter (Signed)
-----   Message from Charlott Rakes, MD sent at 01/22/2019  1:02 PM EDT ----- Please inform the patient that labs are normal. Thank you.

## 2019-04-06 DIAGNOSIS — H5213 Myopia, bilateral: Secondary | ICD-10-CM | POA: Diagnosis not present

## 2019-04-21 ENCOUNTER — Ambulatory Visit: Payer: Medicare Other | Attending: Family Medicine | Admitting: Family Medicine

## 2019-04-21 ENCOUNTER — Encounter: Payer: Self-pay | Admitting: Family Medicine

## 2019-04-21 ENCOUNTER — Other Ambulatory Visit: Payer: Self-pay

## 2019-04-21 VITALS — BP 125/84 | HR 80 | Ht 74.0 in | Wt 192.8 lb

## 2019-04-21 DIAGNOSIS — K219 Gastro-esophageal reflux disease without esophagitis: Secondary | ICD-10-CM

## 2019-04-21 DIAGNOSIS — Z0000 Encounter for general adult medical examination without abnormal findings: Secondary | ICD-10-CM

## 2019-04-21 DIAGNOSIS — R5383 Other fatigue: Secondary | ICD-10-CM

## 2019-04-21 DIAGNOSIS — Z23 Encounter for immunization: Secondary | ICD-10-CM | POA: Diagnosis not present

## 2019-04-21 DIAGNOSIS — G629 Polyneuropathy, unspecified: Secondary | ICD-10-CM

## 2019-04-21 DIAGNOSIS — I5042 Chronic combined systolic (congestive) and diastolic (congestive) heart failure: Secondary | ICD-10-CM

## 2019-04-21 DIAGNOSIS — J441 Chronic obstructive pulmonary disease with (acute) exacerbation: Secondary | ICD-10-CM | POA: Diagnosis not present

## 2019-04-21 DIAGNOSIS — Z86711 Personal history of pulmonary embolism: Secondary | ICD-10-CM

## 2019-04-21 DIAGNOSIS — I25119 Atherosclerotic heart disease of native coronary artery with unspecified angina pectoris: Secondary | ICD-10-CM

## 2019-04-21 MED ORDER — PANTOPRAZOLE SODIUM 40 MG PO TBEC: 40.0000 mg | DELAYED_RELEASE_TABLET | Freq: Every day | ORAL | 3 refills | Status: DC

## 2019-04-21 MED ORDER — APIXABAN 5 MG PO TABS: 5.0000 mg | ORAL_TABLET | Freq: Two times a day (BID) | ORAL | 6 refills | Status: DC

## 2019-04-21 MED ORDER — FUROSEMIDE 40 MG PO TABS: 40.0000 mg | ORAL_TABLET | Freq: Every day | ORAL | 6 refills | Status: DC

## 2019-04-21 MED ORDER — BUDESONIDE-FORMOTEROL FUMARATE 80-4.5 MCG/ACT IN AERO: 2.0000 | INHALATION_SPRAY | Freq: Two times a day (BID) | RESPIRATORY_TRACT | 6 refills | Status: DC

## 2019-04-21 MED ORDER — ATORVASTATIN CALCIUM 40 MG PO TABS: 40.0000 mg | ORAL_TABLET | Freq: Every day | ORAL | 6 refills | Status: DC

## 2019-04-21 MED ORDER — GABAPENTIN 300 MG PO CAPS: 300.0000 mg | ORAL_CAPSULE | Freq: Two times a day (BID) | ORAL | 6 refills | Status: DC

## 2019-04-21 MED ORDER — ALBUTEROL SULFATE (2.5 MG/3ML) 0.083% IN NEBU: 2.5000 mg | INHALATION_SOLUTION | Freq: Four times a day (QID) | RESPIRATORY_TRACT | 3 refills | Status: DC | PRN

## 2019-04-21 MED ORDER — UMECLIDINIUM BROMIDE 62.5 MCG/INH IN AEPB: 1.0000 | INHALATION_SPRAY | Freq: Every day | RESPIRATORY_TRACT | 6 refills | Status: DC

## 2019-04-21 MED FILL — GABAPENTIN 300 MG CAPSULE: 300 | 30 days supply | Qty: 60 | Fill #0

## 2019-04-21 MED FILL — INCRUSE ELLIPTA 62.5 MCG IN: 62.5 | 30 days supply | Qty: 30 | Fill #0

## 2019-04-21 MED FILL — ELIQUIS 5 MG TABLET: 5 | 30 days supply | Qty: 60 | Fill #0

## 2019-04-21 MED FILL — ALBUTEROL SUL 2.5 MG/3 ML S: (2.5 MG/3ML | 6 days supply | Qty: 75 | Fill #0

## 2019-04-21 MED FILL — SYMBICORT 80-4.5 MCG INH: 80-4.5 | 30 days supply | Qty: 10 | Fill #0

## 2019-04-21 MED FILL — ATORVASTATIN CALCIUM 40 MG: 40 | 30 days supply | Qty: 30 | Fill #0

## 2019-04-21 MED FILL — PANTOPRAZOLE SOD DR 40 MG T: 40 | 30 days supply | Qty: 30 | Fill #0

## 2019-04-21 MED FILL — FUROSEMIDE 40 MG TAB: 40 | 30 days supply | Qty: 30 | Fill #0

## 2019-04-21 NOTE — Progress Notes (Signed)
Still gets weak and tired. Lower back Wants to change inhalers.

## 2019-04-21 NOTE — Progress Notes (Signed)
Subjective:  Patient ID: [name], male    DOB: [birth year]  Age: 59 y.o. MRN: [id number]  CC: Hypertension   HPI [name] is a 59 year old male with a history of COPD, chronic sinusitis, recurrent DVT and PE (on anticoagulation with Eliquis), coronary artery disease (status post stent, last cardiac cath from 12/2014 revealed two-vessel CAD, medical management recommended),CHF (EF 40-45% from 11/2017),tobacco abuse who presents today for follow-up visit.  He ran out of Advair and used Symbicort from a family member which he found was more effective than Advair. He would like to have Symbicort instead. Continues to smoke and is not ready to quit.  Still gets fatigued and has to lean over to catch his breath. At the grocery store he has to sit to rest intermittently and feels like he will pass out. He has no energy he says. He has not had any weight gain, pedal edema or chest pain. Currently on Lasix 20mg . Doing well on his statin.Compliant with Eliquis with no bleeding problems.  Echo from 11/2017 revealed : Study Conclusions  - Left ventricle: The cavity size was normal. Wall thickness was   normal. Systolic function was mildly to moderately reduced. The   estimated ejection fraction was in the range of 40% to 45%.   Diffuse hypokinesis. Doppler parameters are consistent with   abnormal left ventricular relaxation (grade 1 diastolic   dysfunction). - Ventricular septum: Septal motion showed abnormal function and   dyssynergy. The contour showed diastolic flattening. - Aorta: Aortic root dimension: 40 mm (ED). - Ascending aorta: The ascending aorta was mildly dilated.   Past Medical History:  Diagnosis Date  . Asthma   . CAD (coronary artery disease)    a. 09/2013 NSTEMI/PCI: LM nl, LAD 40-50%, D1 100 (2.25 x 28 Vision BMS), LCX min irregs, RI 60-70, 30, RCA 40-50/50-71ms/p, EF 45-50%.  b. cath 12/2014 -occulded BMS in diag, 50% pro to mid LAD, 60% ramus, 30% RCA     . Chest pain 08/2015  . Chronic diastolic CHF (congestive heart failure) ([place])    a. 09/2014 EF 45-50% by LV gram;  b. 01/2014 Echo: EF 55-60%, Gr 1 DD.  Marland Kitchen Cocaine abuse ([place])   . COPD (chronic obstructive pulmonary disease) ([place])   . DVT (deep venous thrombosis) ([place])    "I had ~ 10 in each leg" (12/20/2016)  . Essential hypertension   . Hepatitis C    "clear free over a year now"  . High cholesterol   . Myocardial infarction ([place]) ~ 2014  . Osteoarthritis    a. hands and toes.  . Pneumonia    "several times" (12/20/2016)  . Pulmonary embolism ([place])    a. 2012 - s/p IVC filter;  b. prev on eliquis - noncompliant.  . Shortness of breath dyspnea   . Sinus headache   . Squamous cell skin cancer 07/13/2016   "foot"  . Tobacco abuse     Past Surgical History:  Procedure Laterality Date  . CARDIAC CATHETERIZATION N/A 01/04/2015   Procedure: Left Heart Cath and Coronary Angiography;  Surgeon: Burnell Blanks, MD;  Location: [facility];  Service: Cardiovascular;  Laterality: N/A;  . CORONARY ANGIOPLASTY WITH STENT PLACEMENT  10/21/13   BMS to D1  . CYST EXCISION N/A 05/10/2016   Procedure: EXCISION OF SEBACEOUS CYST UPPER BACK;  Surgeon: Donnie Mesa, MD;  Location: [facility];  Service: General;  Laterality: N/A;  . FOOT SURGERY Right ~ 06/2016   "  said there was skin cancer on it"  . LEFT HEART CATHETERIZATION WITH CORONARY ANGIOGRAM N/A 10/21/2013   Procedure: LEFT HEART CATHETERIZATION WITH CORONARY ANGIOGRAM;  Surgeon: Leonie Man, MD;  Location: [facility];  Service: Cardiovascular;  Laterality: N/A;  . VENA CAVA FILTER PLACEMENT  ~ 2007-~ 2017   "1 in my neck; 2 in my wrist"    Family History  Problem Relation Age of Onset  . Asthma Mother   . Allergies Mother   . Allergies Sister   . Allergies Brother   . Deep vein thrombosis Brother        two brothers with recurrent DVT  . Heart attack Father        a.60s b. deceased in his 63s  . Coronary artery disease  Father   . Colon cancer Neg Hx   . Liver cancer Neg Hx   . Esophageal cancer Neg Hx   . Colon polyps Neg Hx   . Rectal cancer Neg Hx   . Pancreatic cancer Neg Hx   . Stomach cancer Neg Hx     Allergies  Allergen Reactions  . No Known Allergies     Outpatient Medications Prior to Visit  Medication Sig Dispense Refill  . albuterol (PROAIR HFA) 108 (90 Base) MCG/ACT inhaler Inhale 2 puffs into the lungs every 6 (six) hours as needed for wheezing or shortness of breath. 18 g 6  . albuterol (PROVENTIL) (2.5 MG/3ML) 0.083% nebulizer solution Take 3 mLs (2.5 mg total) by nebulization every 6 (six) hours as needed for wheezing or shortness of breath. 75 mL 3  . apixaban (ELIQUIS) 5 MG TABS tablet Take 1 tablet (5 mg total) by mouth 2 (two) times daily. 60 tablet 6  . atorvastatin (LIPITOR) 40 MG tablet Take 1 tablet (40 mg total) by mouth daily at 6 PM. 30 tablet 6  . cetirizine (ZYRTEC) 10 MG tablet Take 1 tablet (10 mg total) by mouth daily. 30 tablet 1  . fluticasone (FLONASE) 50 MCG/ACT nasal spray Place 2 sprays into both nostrils 2 (two) times daily. 16 g 2  . Fluticasone-Salmeterol (WIXELA INHUB) 250-50 MCG/DOSE AEPB INHALE 1 PUFF INTO THE LUNGS TWICE DAILY 60 each 2  . furosemide (LASIX) 20 MG tablet Take 1 tablet (20 mg total) by mouth daily. 30 tablet 3  . gabapentin (NEURONTIN) 300 MG capsule Take 1 capsule (300 mg total) by mouth 2 (two) times daily. 60 capsule 3  . pantoprazole (PROTONIX) 40 MG tablet Take 1 tablet (40 mg total) by mouth daily. 30 tablet 3  . tiZANidine (ZANAFLEX) 4 MG tablet Take 1 tablet (4 mg total) by mouth every 8 (eight) hours as needed for muscle spasms. 60 tablet 3  . traMADol (ULTRAM) 50 MG tablet Take 50 mg by mouth every 12 (twelve) hours as needed.    . umeclidinium bromide (INCRUSE ELLIPTA) 62.5 MCG/INH AEPB Inhale 1 puff into the lungs daily. 1 each 6  . ergocalciferol (DRISDOL) 50000 units capsule Take 1 capsule (50,000 Units total) by mouth once a  week. (Patient not taking: Reported on 04/21/2019) 9 capsule 1  . nicotine (NICODERM CQ - DOSED IN MG/24 HOURS) 14 mg/24hr patch Place 1 patch (14 mg total) onto the skin daily. (Patient not taking: Reported on 01/14/2019) 28 patch 0  . nicotine (NICODERM CQ - DOSED IN MG/24 HR) 7 mg/24hr patch Place 1 patch (7 mg total) onto the skin daily. (Patient not taking: Reported on 01/14/2019) 28 patch 0  No facility-administered medications prior to visit.     ROS Review of Systems  Constitutional: Negative for activity change and appetite change.  HENT: Negative for sinus pressure and sore throat.   Eyes: Negative for visual disturbance.  Respiratory: Positive for shortness of breath. Negative for cough and chest tightness.   Cardiovascular: Negative for chest pain and leg swelling.  Gastrointestinal: Negative for abdominal distention, abdominal pain, constipation and diarrhea.  Endocrine: Negative.   Genitourinary: Negative for dysuria.  Musculoskeletal: Negative for joint swelling and myalgias.  Skin: Negative for rash.  Allergic/Immunologic: Negative.   Neurological: Negative for weakness, light-headedness and numbness.  Psychiatric/Behavioral: Negative for dysphoric mood and suicidal ideas.    Objective:  BP 125/84   Pulse 80   Ht 6\' 2"  (1.88 m)   Wt 192 lb 12.8 oz (87.5 kg)   SpO2 99%   BMI 24.75 kg/m   BP/Weight 04/21/2019 [id number]  Systolic BP 0000000 [id number]  Diastolic BP 84 84 88  Wt. (Lbs) 192.8 194 191  BMI 24.75 24.91 24.52      Physical Exam Constitutional:      Appearance: He is well-developed.  Neck:     Vascular: No JVD.  Cardiovascular:     Rate and Rhythm: Normal rate.     Heart sounds: Normal heart sounds. No murmur.  Pulmonary:     Effort: Pulmonary effort is normal.     Breath sounds: Normal breath sounds. No wheezing or rales.  Chest:     Chest wall: No tenderness.  Abdominal:     General: Bowel sounds are normal. There is no distension.      Palpations: Abdomen is soft. There is no mass.     Tenderness: There is no abdominal tenderness.  Musculoskeletal:        General: Normal range of motion.     Right lower leg: No edema.     Left lower leg: No edema.  Neurological:     Mental Status: He is alert and oriented to person, place, and time.  Psychiatric:        Mood and Affect: Mood normal.     CMP Latest Ref Rng & Units 01/21/2019 03/06/2018 06/30/2017  Glucose 65 - 99 mg/dL 92 CANCELED 115(H)  BUN 6 - 24 mg/dL 7 16 7   Creatinine 0.76 - 1.27 mg/dL 0.70(L) 0.80 0.69  Sodium 134 - 144 mmol/L 142 142 137  Potassium 3.5 - 5.2 mmol/L 3.8 CANCELED 3.6  Chloride 96 - 106 mmol/L 105 105 101  CO2 20 - 29 mmol/L 24 17(L) 25  Calcium 8.7 - 10.2 mg/dL 9.3 9.8 9.3  Total Protein 6.0 - 8.5 g/dL 7.5 7.9 -  Total Bilirubin 0.0 - 1.2 mg/dL 0.3 0.3 -  Alkaline Phos 39 - 117 IU/L 123(H) 143(H) -  AST 0 - 40 IU/L 20 39 -  ALT 0 - 44 IU/L 16 34 -    Lipid Panel     Component Value Date/Time   CHOL 145 01/21/2019 0948   TRIG 87 01/21/2019 0948   HDL 57 01/21/2019 0948   CHOLHDL 2.5 01/21/2019 0948   CHOLHDL 3.1 01/01/2015 0208   VLDL 12 01/01/2015 0208   LDLCALC 72 01/21/2019 0948    CBC    Component Value Date/Time   WBC 7.1 06/30/2017 1259   RBC 4.89 06/30/2017 1259   HGB 15.1 06/30/2017 1259   HGB 14.6 08/03/2016 1244   HCT 46.1 06/30/2017 1259   HCT 45.7 08/03/2016 1244  PLT 100 (L) 06/30/2017 1259   PLT 172 08/03/2016 1244   MCV 94.3 06/30/2017 1259   MCV 96 08/03/2016 1244   MCH 30.9 06/30/2017 1259   MCHC 32.8 06/30/2017 1259   RDW 14.0 06/30/2017 1259   RDW 15.7 (H) 08/03/2016 1244   LYMPHSABS 1.3 06/30/2017 1259   LYMPHSABS 1.4 08/03/2016 1244   MONOABS 0.9 06/30/2017 1259   EOSABS 0.2 06/30/2017 1259   EOSABS 0.3 08/03/2016 1244   BASOSABS 0.0 06/30/2017 1259   BASOSABS 0.0 08/03/2016 1244    Lab Results  Component Value Date   HGBA1C 5.4 05/16/2016    Assessment & Plan:  1. Other  fatigue Will screen for vitamin D deffiicency - VITAMIN D 25 Hydroxy (Vit-D Deficiency, Fractures)  2. COPD exacerbation ([place]) Stable States he did better on Symbicort so I have switched from Advair to Symbicort - budesonide-formoterol (SYMBICORT) 80-4.5 MCG/ACT inhaler; Inhale 2 puffs into the lungs 2 (two) times daily.  Dispense: 1 Inhaler; Refill: 6 - albuterol (PROVENTIL) (2.5 MG/3ML) 0.083% nebulizer solution; Take 3 mLs (2.5 mg total) by nebulization every 6 (six) hours as needed for wheezing or shortness of breath.  Dispense: 75 mL; Refill: 3 - umeclidinium bromide (INCRUSE ELLIPTA) 62.5 MCG/INH AEPB; Inhale 1 puff into the lungs daily.  Dispense: 1 each; Refill: 6  3. History of pulmonary embolism On long term anticoagulation with Eliquis - apixaban (ELIQUIS) 5 MG TABS tablet; Take 1 tablet (5 mg total) by mouth 2 (two) times daily.  Dispense: 60 tablet; Refill: 6  4. Chronic combined systolic and diastolic congestive heart failure (HCC) EF 40-45% from 11/2017 No evidence of fluid overload on exam Lasix increased to 40mg  Unable to add ACEI due to soft BP Will send off BNP - Brain natriuretic peptide - furosemide (LASIX) 40 MG tablet; Take 1 tablet (40 mg total) by mouth daily.  Dispense: 30 tablet; Refill: 6  5. Coronary artery disease involving native coronary artery of native heart with angina pectoris (HCC) No angina Risk factor modifications - atorvastatin (LIPITOR) 40 MG tablet; Take 1 tablet (40 mg total) by mouth daily at 6 PM.  Dispense: 30 tablet; Refill: 6  6. Neuropathy Stable - gabapentin (NEURONTIN) 300 MG capsule; Take 1 capsule (300 mg total) by mouth 2 (two) times daily.  Dispense: 60 capsule; Refill: 6  7. Gastroesophageal reflux disease without esophagitis Stable - pantoprazole (PROTONIX) 40 MG tablet; Take 1 tablet (40 mg total) by mouth daily.  Dispense: 30 tablet; Refill: 3  8. Healthcare maintenance - Varicella-zoster vaccine IM - Tdap vaccine  greater than or equal to 7yo IM   Return in about 3 months (around 07/20/2019) for Chronic medical conditions.    Charlott Rakes, MD, FAAFP. [facility] [place], [place]   04/21/2019, 1:50 PM

## 2019-04-22 ENCOUNTER — Other Ambulatory Visit: Payer: Self-pay | Admitting: Family Medicine

## 2019-04-22 DIAGNOSIS — H5203 Hypermetropia, bilateral: Secondary | ICD-10-CM | POA: Diagnosis not present

## 2019-04-22 LAB — BRAIN NATRIURETIC PEPTIDE: BNP: 43.5 pg/mL (ref 0.0–100.0)

## 2019-04-22 LAB — VITAMIN D 25 HYDROXY (VIT D DEFICIENCY, FRACTURES): Vit D, 25-Hydroxy: 10.5 ng/mL — ABNORMAL LOW (ref 30.0–100.0)

## 2019-04-22 MED ORDER — ERGOCALCIFEROL 1.25 MG (50000 UT) PO CAPS: 50000.0000 [IU] | ORAL_CAPSULE | ORAL | 0 refills | Status: DC

## 2019-04-24 ENCOUNTER — Telehealth: Payer: Self-pay

## 2019-04-24 NOTE — Telephone Encounter (Signed)
Patient name and DOB has been verified Patient was informed of lab results. Patient had no questions.  

## 2019-04-24 NOTE — Telephone Encounter (Signed)
-----   Message from Charlott Rakes, MD sent at 04/22/2019  3:38 PM EST ----- Vitamin D level is low and I have sent a prescription for Drisdol to his pharmacy for 12 weeks after which he will need to continue over-the-counter vitamin D 1000 units daily.  Labs do not reveal evidence of fluid around his heart.

## 2019-04-29 ENCOUNTER — Encounter: Payer: Self-pay | Admitting: Internal Medicine

## 2019-04-29 ENCOUNTER — Ambulatory Visit (INDEPENDENT_AMBULATORY_CARE_PROVIDER_SITE_OTHER): Payer: Medicare Other | Admitting: Internal Medicine

## 2019-04-29 ENCOUNTER — Other Ambulatory Visit: Payer: Self-pay

## 2019-04-29 VITALS — BP 125/87 | HR 77 | Temp 97.3°F | Ht 74.0 in | Wt 198.2 lb

## 2019-04-29 DIAGNOSIS — D696 Thrombocytopenia, unspecified: Secondary | ICD-10-CM

## 2019-04-29 DIAGNOSIS — I25119 Atherosclerotic heart disease of native coronary artery with unspecified angina pectoris: Secondary | ICD-10-CM

## 2019-04-29 DIAGNOSIS — I739 Peripheral vascular disease, unspecified: Secondary | ICD-10-CM | POA: Diagnosis not present

## 2019-04-29 DIAGNOSIS — R079 Chest pain, unspecified: Secondary | ICD-10-CM

## 2019-04-29 DIAGNOSIS — R0600 Dyspnea, unspecified: Secondary | ICD-10-CM

## 2019-04-29 DIAGNOSIS — R0609 Other forms of dyspnea: Secondary | ICD-10-CM

## 2019-04-29 MED ORDER — ASPIRIN EC 81 MG PO TBEC: 81.0000 mg | DELAYED_RELEASE_TABLET | Freq: Every day | ORAL | 3 refills | Status: DC

## 2019-04-29 NOTE — Progress Notes (Signed)
Cardiology Office Note:    Date:  04/29/2019   ID:  [name], DOB Apr 14, 1960, MRN [id number]  PCP:  [name] Rakes, MD  Cardiologist:  No primary care provider on file.  Electrophysiologist:  None   Referring MD: [name] Rakes, MD   Chief Complaint: chest pain, shortness of breath  History of Present Illness:    [name] is a 59 y.o. male with a hx of CAD (s/p BMS to D1 in 2015, noted to be chronically occluded by cath in 12/2014 with scattered 30% RCA stenosis, 20% LCx, and 50% Prox-Mid LAD stenosis at that time), PE/DVT (s/p IVC filter placement in 2012), COPD, chronic diastolic CHF (EF 0000000 by echo in 12/2014), HTN, HLD, chronic Hepatitis C, substance use (Cocaine - UDS from 01/2016 positive), and tobacco use.  Last seen by my colleague [name], [place] in January 2018 for cardiovascular optimization prior to removal of cyst on back.  At that time he was felt to be asymptomatic with no chest pain, palpitations, or dizziness, lightheadedness.  Was noted to have baseline dyspnea with exertion, on Advair and albuterol.  Patient tells me that since 2019 he has had worsening shortness of breath with exertion, chest pain, lower extremity claudication.  He tells me that when he finishes showering he has to rest prior to drying himself off.  He notes that he can go only approximately 6 steps up stairs prior to developing a chest tightness with dyspnea on exertion and claudication, all 3 symptoms stopping his activity level at the same time.  He can go 30-40 steps on a hill prior to having to stop, and notes that when he carries his groceries home from the store he has quite a bit of difficulty with this.  He says that he does fine on level ground.  He was recently transitioned from Advair to Symbicort by his primary care physician due to patient suggesting that Symbicort may work better.  He has not yet filled his albuterol inhaler.  He is using his Symbicort as needed as  opposed to scheduled, and we discussed appropriate use of this medication.  He has not yet tried his albuterol inhaler for his current symptoms.  He is very concerned that his symptoms feel like "going to have another heart attack".  They have been slowly progressive over time.  He denies chest pain at rest or significant shortness of breath at rest.  He was evaluated in the hospital by my colleagues in 2017 and at that time underwent a nuclear stress test for hospital presentation for chest pain consistent with UA. Stress Myoview was performed demonstrating a small defect of moderate severity consistent with ischemia in the LAD territory. EF described as normal. Myoview felt to be similar as compared to the one from 2016, with known occluded diagonal. Medical management was pursued. He has not been prescribed a beta-blocker given history of cocaine use. He tells me that he last used cocaine 6 to 7 months ago but otherwise has not. We discussed the critical nature of avoiding beta-blockers in the setting of active cocaine use.  Last echocardiogram September 2019 shows a decrease in ejection fraction with LVEF 40 to 45% with diffuse hypokinesis, decreased as compared to the one prior which was 55%.  With his current symptoms BNP was obtained last week which is not significantly elevated. Other labs from October 2020 demonstrate normal renal function, LDL at goal of 72. Last CBC in our record is from 2019 demonstrating normal  hemoglobin and low platelet count of 100,000.  Past Medical History:  Diagnosis Date  . Asthma   . CAD (coronary artery disease)    a. 09/2013 NSTEMI/PCI: LM nl, LAD 40-50%, D1 100 (2.25 x 28 Vision BMS), LCX min irregs, RI 60-70, 30, RCA 40-50/50-22ms/p, EF 45-50%.  b. cath 12/2014 -occulded BMS in diag, 50% pro to mid LAD, 60% ramus, 30% RCA   . Chest pain 08/2015  . Chronic diastolic CHF (congestive heart failure) ([place])    a. 09/2014 EF 45-50% by LV gram;  b. 01/2014 Echo: EF  55-60%, Gr 1 DD.  Marland Kitchen Cocaine abuse ([place])   . COPD (chronic obstructive pulmonary disease) ([place])   . DVT (deep venous thrombosis) ([place])    "I had ~ 10 in each leg" (12/20/2016)  . Essential hypertension   . Hepatitis C    "clear free over a year now"  . High cholesterol   . Myocardial infarction ([place]) ~ 2014  . Osteoarthritis    a. hands and toes.  . Pneumonia    "several times" (12/20/2016)  . Pulmonary embolism ([place])    a. 2012 - s/p IVC filter;  b. prev on eliquis - noncompliant.  . Shortness of breath dyspnea   . Sinus headache   . Squamous cell skin cancer 07/13/2016   "foot"  . Tobacco abuse     Past Surgical History:  Procedure Laterality Date  . CARDIAC CATHETERIZATION N/A 01/04/2015   Procedure: Left Heart Cath and Coronary Angiography;  Surgeon: [name] Blanks, MD;  Location: [place] [facility];  Service: Cardiovascular;  Laterality: N/A;  . CORONARY ANGIOPLASTY WITH STENT PLACEMENT  10/21/13   BMS to D1  . CYST EXCISION N/A 05/10/2016   Procedure: EXCISION OF SEBACEOUS CYST UPPER BACK;  Surgeon: [name] Mesa, MD;  Location: [place] [place];  Service: General;  Laterality: N/A;  . FOOT SURGERY Right ~ 06/2016   "said there was skin cancer on it"  . LEFT HEART CATHETERIZATION WITH CORONARY ANGIOGRAM N/A 10/21/2013   Procedure: LEFT HEART CATHETERIZATION WITH CORONARY ANGIOGRAM;  Surgeon: Leonie Man, MD;  Location: [facility] CATH [facility];  Service: Cardiovascular;  Laterality: N/A;  . VENA CAVA FILTER PLACEMENT  ~ 2007-~ 2017   "1 in my neck; 2 in my wrist"    Current Medications: No outpatient medications have been marked as taking for the 04/29/19 encounter (Appointment) with [name] Munroe, MD.     Allergies:   No known allergies   Social History   Socioeconomic History  . Marital status: Single    Spouse name: Not on file  . Number of children: Not on file  . Years of education: Not on file  . Highest education level: Not on file  Occupational History  .  Occupation: unemployed  Tobacco Use  . Smoking status: Current Every Day Smoker    Packs/day: 0.25    Years: 39.00    Pack years: 9.75    Types: Cigarettes, Cigars  . Smokeless tobacco: Never Used  Substance and Sexual Activity  . Alcohol use: Yes    Alcohol/week: 6.0 standard drinks    Types: 2 Cans of beer, 4 Shots of liquor per week  . Drug use: Not Currently    Types: Cocaine    Comment: 12/20/2016 "might have used some the other day; I'm not sure"  . Sexual activity: Not Currently  Other Topics Concern  . Not on file  Social History Narrative   Lives in [place].  Social Determinants of Health   Financial Resource Strain:   . Difficulty of Paying Living Expenses: Not on file  Food Insecurity:   . Worried About Charity fundraiser in the Last Year: Not on file  . Ran Out of Food in the Last Year: Not on file  Transportation Needs:   . Lack of Transportation (Medical): Not on file  . Lack of Transportation (Non-Medical): Not on file  Physical Activity:   . Days of Exercise per Week: Not on file  . Minutes of Exercise per Session: Not on file  Stress:   . Feeling of Stress : Not on file  Social Connections:   . Frequency of Communication with Friends and Family: Not on file  . Frequency of Social Gatherings with Friends and Family: Not on file  . Attends Religious Services: Not on file  . Active Member of Clubs or Organizations: Not on file  . Attends Archivist Meetings: Not on file  . Marital Status: Not on file     Family History: The patient's family history includes Allergies in his brother, mother, and sister; Asthma in his mother; Coronary artery disease in his father; Deep vein thrombosis in his brother; Heart attack in his father. There is no history of Colon cancer, Liver cancer, Esophageal cancer, Colon polyps, Rectal cancer, Pancreatic cancer, or Stomach cancer.  ROS:   Please see the history of present illness.    All other systems reviewed and  are negative.  EKGs/Labs/Other Studies Reviewed:    The following studies were reviewed today:  EKG:  NSR, LVH  Recent Labs: 01/21/2019: ALT 16; BUN 7; Creatinine, Ser 0.70; Potassium 3.8; Sodium 142 04/21/2019: BNP 43.5  Recent Lipid Panel    Component Value Date/Time   CHOL 145 01/21/2019 0948   TRIG 87 01/21/2019 0948   HDL 57 01/21/2019 0948   CHOLHDL 2.5 01/21/2019 0948   CHOLHDL 3.1 01/01/2015 0208   VLDL 12 01/01/2015 0208   LDLCALC 72 01/21/2019 0948    Physical Exam:    VS:  BP 125/87   Pulse 77   Temp (!) 97.3 F (36.3 C)   Ht 6\' 2"  (1.88 m)   Wt 198 lb 3.2 oz (89.9 kg)   SpO2 92%   BMI 25.45 kg/m     Wt Readings from Last 5 Encounters:  04/21/19 192 lb 12.8 oz (87.5 kg)  08/26/18 194 lb (88 kg)  05/01/18 191 lb (86.6 kg)  04/12/18 191 lb 4 oz (86.8 kg)  03/06/18 188 lb 6.4 oz (85.5 kg)     Constitutional: No acute distress Eyes: sclera non-icteric, normal conjunctiva and lids ENMT: normal dentition, moist mucous membranes Cardiovascular: regular rhythm, normal rate, no murmurs. S1 and S2 normal. Radial pulses normal bilaterally. No jugular venous distention.  Respiratory: clear to auscultation bilaterally GI : normal bowel sounds, soft and nontender. No distention.   MSK: extremities warm, well perfused. No edema.  NEURO: grossly nonfocal exam, moves all extremities. PSYCH: alert and oriented x 3, normal mood and affect.   ASSESSMENT:    1. Dyspnea on exertion   2. Coronary artery disease involving native coronary artery of native heart with angina pectoris (HCC)   3. Claudication (HCC)   4. Chest pain, unspecified type   5. Thrombocytopenia ([place])    PLAN:    DOE -multifactorial, and I have spoken to the patient about how best to use his Symbicort and albuterol. Defer further recommendations to his primary care physician. His  dyspnea on exertion is concerning in the setting of chest tightness for recurrent ischemia, and he has had  revascularization in the past. To further restratify we will obtain a stress Myoview to ensure there is no new ischemia contributing to his symptoms. I am suspicious that it may be related to his COPD and he has not tried albuterol for his symptoms and will be picking up this prescription tomorrow. I have asked him to try his albuterol inhaler when he gets symptoms of dyspnea and chest tightness and report back when we see him next how this has affected his symptoms. I do not doubt that he may have progression of CAD, therefore stress Myoview is imperative. He is known to have a small ischemic defect in the LAD territory from previous stress studies related to an occluded diagonal.  Chest pain-as above, stress Myoview. Symptoms sound like stable angina.  Claudication-should have repeat arterial studies, last arterial studies in 2018 demonstrate abnormal flow quite distally. He is complaining of whole leg discomfort, so we will evaluate for vascular disease.  Thrombocytopenia-last CBC is from 2019. His platelet count was 100,000 at that time. He currently is on Eliquis without bleeding complication. It would be optimal for him to take a baby aspirin in the setting of previous stent, however I am concerned with the initiation of aspirin if we do not have a recent platelet count or hemoglobin. We will obtain a CBC. If normal, would recommend 81 mg aspirin daily in addition to Eliquis therapy. He has been educated on bleeding complications.  History of DVT PE-continues on Eliquis 5 mg twice daily, continue.  Reduced ejection fraction-we will evaluate this with stress Myoview to see if his EF has remained low. Could consider repeat echocardiogram as needed for quantitation of EF. Challenge with using heart failure therapy given concerns of hypotension as well as cocaine use limiting the ability to add ACE inhibitor/ARB/ARNI or beta-blocker, respectively.  CAD-continue Lipitor, will get stress Myoview for  chest tightness and dyspnea on exertion.  I will see the patient back in close follow-up after testing is complete.    Total time of encounter: 60 minutes total time of encounter, including 35 minutes spent in face-to-face patient care. This time includes coordination of care and counseling regarding DOE, Chest pain. Remainder of non-face-to-face time involved reviewing chart documents/testing relevant to the patient encounter and documentation in the medical record.  Cherlynn Kaiser, MD [place]  [facility]   Medication Adjustments/Labs and Tests Ordered: Current medicines are reviewed at length with the patient today.  Concerns regarding medicines are outlined above.  No orders of the defined types were placed in this encounter.  No orders of the defined types were placed in this encounter.   There are no Patient Instructions on file for this visit.

## 2019-04-29 NOTE — Patient Instructions (Signed)
Medication Instructions:  Start taking  81 mg enteric coated aspirin   One tablet daily   continue all other medications    When you become short of breath [place] You[place] Albuterol Inhaler if needed  *If you need a refill on your cardiac medications before your next appointment, please call your pharmacy*  Lab Work: Not needed  Testing/Procedures: WILL BE SCHEDULE AT [place] [address] Your physician has requested that you have a lexiscan myoview. Please follow instruction sheet, as given.  WILL BE SCHEDULE AT [place] [address] Your physician has requested that you have a lower extremity arterial duplex. This test is an ultrasound of the arteries in the legs . It looks at arterial blood flow in the legs . Allow one hour for Lower Arterial scans. There are no restrictions or special instructions  Follow-Up: At [facility], you and your health needs are our priority.  As part of our continuing mission to provide you with exceptional heart care, we have created designated Provider Care Teams.  These Care Teams include your primary Cardiologist (physician) and Advanced Practice Providers (APPs -  Physician Assistants and Nurse Practitioners) who all work together to provide you with the care you need, when you need it.  Your next appointment:   1 month(s)  The format for your next appointment:   In Person  Provider:   Cherlynn Kaiser, MD  Other Instructions n/a

## 2019-05-01 ENCOUNTER — Other Ambulatory Visit: Payer: Self-pay | Admitting: Internal Medicine

## 2019-05-01 ENCOUNTER — Telehealth (HOSPITAL_COMMUNITY): Payer: Self-pay | Admitting: *Deleted

## 2019-05-01 DIAGNOSIS — I739 Peripheral vascular disease, unspecified: Secondary | ICD-10-CM

## 2019-05-01 DIAGNOSIS — R0609 Other forms of dyspnea: Secondary | ICD-10-CM

## 2019-05-01 NOTE — Telephone Encounter (Signed)
Left message on voicemail per DPR in reference to upcoming appointment scheduled on 05/07/19 with detailed instructions given per Myocardial Perfusion Study Information Sheet for the test. LM to arrive 15 minutes early, and that it is imperative to arrive on time for appointment to keep from having the test rescheduled. If you need to cancel or reschedule your appointment, please call the office within 24 hours of your appointment. Failure to do so may result in a cancellation of your appointment, and a $50 no show fee. Phone number given for call back for any questions. Kirstie Peri

## 2019-05-07 ENCOUNTER — Other Ambulatory Visit: Payer: Self-pay

## 2019-05-07 ENCOUNTER — Ambulatory Visit (HOSPITAL_COMMUNITY): Payer: Medicare Other | Attending: Cardiology

## 2019-05-07 DIAGNOSIS — R079 Chest pain, unspecified: Secondary | ICD-10-CM | POA: Insufficient documentation

## 2019-05-07 DIAGNOSIS — R0609 Other forms of dyspnea: Secondary | ICD-10-CM

## 2019-05-07 DIAGNOSIS — I25119 Atherosclerotic heart disease of native coronary artery with unspecified angina pectoris: Secondary | ICD-10-CM | POA: Insufficient documentation

## 2019-05-07 DIAGNOSIS — R0600 Dyspnea, unspecified: Secondary | ICD-10-CM | POA: Insufficient documentation

## 2019-05-07 LAB — MYOCARDIAL PERFUSION IMAGING
LV dias vol: 98 mL (ref 62–150)
LV sys vol: 48 mL
Peak HR: 96 {beats}/min
Rest HR: 67 {beats}/min
SDS: 2
SRS: 2
SSS: 4
TID: 0.88

## 2019-05-07 MED ORDER — TECHNETIUM TC 99M TETROFOSMIN IV KIT
10.8000 | PACK | Freq: Once | INTRAVENOUS | Status: AC | PRN
  Administered 2019-05-07: 10.8 via INTRAVENOUS
  Filled 2019-05-07: qty 11

## 2019-05-07 MED ORDER — REGADENOSON 0.4 MG/5ML IV SOLN
0.4000 mg | Freq: Once | INTRAVENOUS | Status: AC
  Administered 2019-05-07: 0.4 mg via INTRAVENOUS

## 2019-05-07 MED ORDER — TECHNETIUM TC 99M TETROFOSMIN IV KIT
31.7000 | PACK | Freq: Once | INTRAVENOUS | Status: AC | PRN
  Administered 2019-05-07: 31.7 via INTRAVENOUS
  Filled 2019-05-07: qty 32

## 2019-05-12 ENCOUNTER — Ambulatory Visit (HOSPITAL_COMMUNITY)
Admission: RE | Admit: 2019-05-12 | Discharge: 2019-05-12 | Disposition: A | Discharge status: Home / Self Care | Payer: Medicare Other | Source: Ambulatory Visit | Attending: Cardiovascular Disease | Admitting: Cardiovascular Disease

## 2019-05-12 ENCOUNTER — Other Ambulatory Visit: Payer: Self-pay

## 2019-05-12 DIAGNOSIS — R0600 Dyspnea, unspecified: Secondary | ICD-10-CM | POA: Insufficient documentation

## 2019-05-12 DIAGNOSIS — I739 Peripheral vascular disease, unspecified: Secondary | ICD-10-CM

## 2019-05-12 DIAGNOSIS — R0609 Other forms of dyspnea: Secondary | ICD-10-CM

## 2019-05-20 ENCOUNTER — Telehealth: Payer: Self-pay | Admitting: Internal Medicine

## 2019-05-20 NOTE — Telephone Encounter (Signed)
Returned call to patient, advised once report reviewed by Dr. Margaretann Loveless will call with results and recommendations.  Patient aware.

## 2019-05-20 NOTE — Telephone Encounter (Signed)
New message    Pt is calling for results of his last test     Please call

## 2019-06-03 ENCOUNTER — Other Ambulatory Visit: Payer: Self-pay

## 2019-06-03 NOTE — Patient Outreach (Signed)
[facility]) Care Management  06/03/2019  [name] 02/28/1961 [id number]   Medication Adherence call to [name] [place] Identifiers Verify spoke with patient he is past due on Atorvastatin 40 mg, patient explain he takes 1 tablet at 6 pm and has enough for another week patient will order when due. [name] is showing past due under [place].   [name] Management Direct Dial [phone]  Fax [phone] [email or web address]

## 2019-06-05 ENCOUNTER — Other Ambulatory Visit: Payer: Self-pay | Admitting: Family Medicine

## 2019-06-05 MED FILL — ALBUTEROL SUL 2.5 MG/3 ML S: (2.5 MG/3ML | 6 days supply | Qty: 75 | Fill #1

## 2019-06-05 MED FILL — ATORVASTATIN CALCIUM 40 MG: 40 | 30 days supply | Qty: 30 | Fill #1

## 2019-06-05 MED FILL — GABAPENTIN 300 MG CAPSULE: 300 | 30 days supply | Qty: 60 | Fill #1

## 2019-06-05 MED FILL — SYMBICORT 80-4.5 MCG INH: 80-4.5 | 30 days supply | Qty: 10 | Fill #1

## 2019-06-05 MED FILL — FLUTICASONE PROP 50 MCG SPR: 50 | 30 days supply | Qty: 16 | Fill #0

## 2019-06-05 MED FILL — ELIQUIS 5 MG TABLET: 5 | 30 days supply | Qty: 60 | Fill #1

## 2019-06-05 MED FILL — INCRUSE ELLIPTA 62.5 MCG IN: 62.5 | 30 days supply | Qty: 30 | Fill #1

## 2019-06-05 MED FILL — FUROSEMIDE 40 MG TAB: 40 | 30 days supply | Qty: 30 | Fill #1

## 2019-06-05 MED FILL — PANTOPRAZOLE SOD DR 40 MG T: 40 | 30 days supply | Qty: 30 | Fill #1

## 2019-06-09 ENCOUNTER — Encounter: Payer: Self-pay | Admitting: Internal Medicine

## 2019-06-09 ENCOUNTER — Ambulatory Visit (INDEPENDENT_AMBULATORY_CARE_PROVIDER_SITE_OTHER): Payer: Medicare Other | Admitting: Internal Medicine

## 2019-06-09 ENCOUNTER — Other Ambulatory Visit: Payer: Self-pay

## 2019-06-09 ENCOUNTER — Encounter (INDEPENDENT_AMBULATORY_CARE_PROVIDER_SITE_OTHER): Payer: Self-pay

## 2019-06-09 VITALS — BP 118/89 | HR 81 | Temp 97.0°F | Ht 74.0 in | Wt 201.2 lb

## 2019-06-09 DIAGNOSIS — I739 Peripheral vascular disease, unspecified: Secondary | ICD-10-CM

## 2019-06-09 DIAGNOSIS — D696 Thrombocytopenia, unspecified: Secondary | ICD-10-CM | POA: Diagnosis not present

## 2019-06-09 DIAGNOSIS — R0609 Other forms of dyspnea: Secondary | ICD-10-CM

## 2019-06-09 DIAGNOSIS — R0600 Dyspnea, unspecified: Secondary | ICD-10-CM | POA: Diagnosis not present

## 2019-06-09 DIAGNOSIS — R079 Chest pain, unspecified: Secondary | ICD-10-CM

## 2019-06-09 DIAGNOSIS — R600 Localized edema: Secondary | ICD-10-CM

## 2019-06-09 DIAGNOSIS — I10 Essential (primary) hypertension: Secondary | ICD-10-CM

## 2019-06-09 DIAGNOSIS — I5032 Chronic diastolic (congestive) heart failure: Secondary | ICD-10-CM

## 2019-06-09 LAB — CBC
Hematocrit: 38.4 % (ref 37.5–51.0)
Hemoglobin: 13 g/dL (ref 13.0–17.7)
MCH: 31.3 pg (ref 26.6–33.0)
MCHC: 33.9 g/dL (ref 31.5–35.7)
MCV: 93 fL (ref 79–97)
Platelets: 212 10*3/uL (ref 150–450)
RBC: 4.15 x10E6/uL (ref 4.14–5.80)
RDW: 13 % (ref 11.6–15.4)
WBC: 5.2 10*3/uL (ref 3.4–10.8)

## 2019-06-09 NOTE — Progress Notes (Signed)
Cardiology Office Note:    Date:  06/09/2019   ID:  [name], DOB April 07, 1960, MRN [id number]  PCP:  Charlott Rakes, MD  Cardiologist:  No primary care provider on file.  Electrophysiologist:  None   Referring MD: Charlott Rakes, MD   Chief Complaint: f/u chest pain and SOB  History of Present Illness:    [name] is a 59 y.o. male with a history of CAD (s/p BMS to D1 in 2015, noted to be chronically occluded by cath in 12/2014 with scattered 30% RCA stenosis, 20% LCx, and 50% Prox-Mid LAD stenosis at that time), PE/DVT (s/p IVC filter placement in 2012), COPD, chronic diastolic CHF(EF 0000000 by echo in 12/2014), HTN, HLD, chronic Hepatitis C, substance use (Cocaine - UDS from 01/2016 positive),and tobacco use.  Presents today for follow up of testing.   DOE much better after using symbicort BID scheduled. No longer feels like he's going to have another MI.   Continues to be concerned about claudication, feels like his legs "weight 20 lbs when walking". We discussed results of vascular testing, with distal disease noted on left, abnormal TBI, bilateral occlusion of PTA. Vascular consult recommended.  Past Medical History:  Diagnosis Date  . Asthma   . CAD (coronary artery disease)    a. 09/2013 NSTEMI/PCI: LM nl, LAD 40-50%, D1 100 (2.25 x 28 Vision BMS), LCX min irregs, RI 60-70, 30, RCA 40-50/50-37ms/p, EF 45-50%.  b. cath 12/2014 -occulded BMS in diag, 50% pro to mid LAD, 60% ramus, 30% RCA   . Chest pain 08/2015  . Chronic diastolic CHF (congestive heart failure) ([place])    a. 09/2014 EF 45-50% by LV gram;  b. 01/2014 Echo: EF 55-60%, Gr 1 DD.  Marland Kitchen Cocaine abuse ([place])   . COPD (chronic obstructive pulmonary disease) ([place])   . DVT (deep venous thrombosis) ([place])    "I had ~ 10 in each leg" (12/20/2016)  . Essential hypertension   . Hepatitis C    "clear free over a year now"  . High cholesterol   . Myocardial infarction ([place]) ~ 2014  . Osteoarthritis    a.  hands and toes.  . Pneumonia    "several times" (12/20/2016)  . Pulmonary embolism ([place])    a. 2012 - s/p IVC filter;  b. prev on eliquis - noncompliant.  . Shortness of breath dyspnea   . Sinus headache   . Squamous cell skin cancer 07/13/2016   "foot"  . Tobacco abuse     Past Surgical History:  Procedure Laterality Date  . CARDIAC CATHETERIZATION N/A 01/04/2015   Procedure: Left Heart Cath and Coronary Angiography;  Surgeon: Burnell Blanks, MD;  Location: [facility];  Service: Cardiovascular;  Laterality: N/A;  . CORONARY ANGIOPLASTY WITH STENT PLACEMENT  10/21/13   BMS to D1  . CYST EXCISION N/A 05/10/2016   Procedure: EXCISION OF SEBACEOUS CYST UPPER BACK;  Surgeon: Donnie Mesa, MD;  Location: [facility];  Service: General;  Laterality: N/A;  . FOOT SURGERY Right ~ 06/2016   "said there was skin cancer on it"  . LEFT HEART CATHETERIZATION WITH CORONARY ANGIOGRAM N/A 10/21/2013   Procedure: LEFT HEART CATHETERIZATION WITH CORONARY ANGIOGRAM;  Surgeon: Leonie Man, MD;  Location: [facility];  Service: Cardiovascular;  Laterality: N/A;  . VENA CAVA FILTER PLACEMENT  ~ 2007-~ 2017   "1 in my neck; 2 in my wrist"    Current Medications: Current Meds  Medication Sig  .  albuterol (PROAIR HFA) 108 (90 Base) MCG/ACT inhaler Inhale 2 puffs into the lungs every 6 (six) hours as needed for wheezing or shortness of breath.  Marland Kitchen albuterol (PROVENTIL) (2.5 MG/3ML) 0.083% nebulizer solution Take 3 mLs (2.5 mg total) by nebulization every 6 (six) hours as needed for wheezing or shortness of breath.  Marland Kitchen apixaban (ELIQUIS) 5 MG TABS tablet Take 1 tablet (5 mg total) by mouth 2 (two) times daily.  Marland Kitchen aspirin EC 81 MG tablet Take 1 tablet (81 mg total) by mouth daily.  Marland Kitchen atorvastatin (LIPITOR) 40 MG tablet Take 1 tablet (40 mg total) by mouth daily at 6 PM.  . budesonide-formoterol (SYMBICORT) 80-4.5 MCG/ACT inhaler Inhale 2 puffs into the lungs 2 (two) times daily.  . cetirizine  (ZYRTEC) 10 MG tablet Take 1 tablet (10 mg total) by mouth daily.  . ergocalciferol (DRISDOL) 1.25 MG (50000 UT) capsule Take 1 capsule (50,000 Units total) by mouth once a week.  . fluticasone (FLONASE) 50 MCG/ACT nasal spray PLACE 2 SPRAYS INTO BOTH NOSTRILS 2 (TWO) TIMES DAILY.  . furosemide (LASIX) 40 MG tablet Take 1 tablet (40 mg total) by mouth daily.  Marland Kitchen gabapentin (NEURONTIN) 300 MG capsule Take 1 capsule (300 mg total) by mouth 2 (two) times daily.  . nicotine (NICODERM CQ - DOSED IN MG/24 HOURS) 14 mg/24hr patch Place 1 patch (14 mg total) onto the skin daily.  . nicotine (NICODERM CQ - DOSED IN MG/24 HR) 7 mg/24hr patch Place 1 patch (7 mg total) onto the skin daily.  . pantoprazole (PROTONIX) 40 MG tablet Take 1 tablet (40 mg total) by mouth daily.  Marland Kitchen tiZANidine (ZANAFLEX) 4 MG tablet Take 1 tablet (4 mg total) by mouth every 8 (eight) hours as needed for muscle spasms.  . traMADol (ULTRAM) 50 MG tablet Take 50 mg by mouth every 12 (twelve) hours as needed.  . umeclidinium bromide (INCRUSE ELLIPTA) 62.5 MCG/INH AEPB Inhale 1 puff into the lungs daily.     Allergies:   No known allergies   Social History   Socioeconomic History  . Marital status: Single    Spouse name: Not on file  . Number of children: Not on file  . Years of education: Not on file  . Highest education level: Not on file  Occupational History  . Occupation: unemployed  Tobacco Use  . Smoking status: Current Every Day Smoker    Packs/day: 0.25    Years: 39.00    Pack years: 9.75    Types: Cigarettes, Cigars  . Smokeless tobacco: Never Used  Substance and Sexual Activity  . Alcohol use: Yes    Alcohol/week: 6.0 standard drinks    Types: 2 Cans of beer, 4 Shots of liquor per week  . Drug use: Not Currently    Types: Cocaine    Comment: 12/20/2016 "might have used some the other day; I'm not sure"  . Sexual activity: Not Currently  Other Topics Concern  . Not on file  Social History Narrative   Lives  in [place].   Social Determinants of Health   Financial Resource Strain:   . Difficulty of Paying Living Expenses:   Food Insecurity:   . Worried About Charity fundraiser in the Last Year:   . Arboriculturist in the Last Year:   Transportation Needs:   . Film/video editor (Medical):   Marland Kitchen Lack of Transportation (Non-Medical):   Physical Activity:   . Days of Exercise per Week:   .  Minutes of Exercise per Session:   Stress:   . Feeling of Stress :   Social Connections:   . Frequency of Communication with Friends and Family:   . Frequency of Social Gatherings with Friends and Family:   . Attends Religious Services:   . Active Member of Clubs or Organizations:   . Attends Archivist Meetings:   Marland Kitchen Marital Status:      Family History: The patient's family history includes Allergies in his brother, mother, and sister; Asthma in his mother; Coronary artery disease in his father; Deep vein thrombosis in his brother; Heart attack in his father. There is no history of Colon cancer, Liver cancer, Esophageal cancer, Colon polyps, Rectal cancer, Pancreatic cancer, or Stomach cancer.  ROS:   Please see the history of present illness.    All other systems reviewed and are negative.  EKGs/Labs/Other Studies Reviewed:    The following studies were reviewed today:  EKG:  Not performed today  Recent Labs: 01/21/2019: ALT 16; BUN 7; Creatinine, Ser 0.70; Potassium 3.8; Sodium 142 04/21/2019: BNP 43.5  Recent Lipid Panel    Component Value Date/Time   CHOL 145 01/21/2019 0948   TRIG 87 01/21/2019 0948   HDL 57 01/21/2019 0948   CHOLHDL 2.5 01/21/2019 0948   CHOLHDL 3.1 01/01/2015 0208   VLDL 12 01/01/2015 0208   LDLCALC 72 01/21/2019 0948    Physical Exam:    VS:  BP 118/89   Pulse 81   Temp (!) 97 F (36.1 C)   Ht 6\' 2"  (1.88 m)   Wt 201 lb 3.2 oz (91.3 kg)   SpO2 98%   BMI 25.83 kg/m     Wt Readings from Last 5 Encounters:  06/09/19 201 lb 3.2 oz (91.3 kg)    05/07/19 198 lb (89.8 kg)  04/29/19 198 lb 3.2 oz (89.9 kg)  04/21/19 192 lb 12.8 oz (87.5 kg)  08/26/18 194 lb (88 kg)     Constitutional: No acute distress Eyes: sclera non-icteric, normal conjunctiva and lids ENMT: normal dentition, moist mucous membranes Cardiovascular: regular rhythm, normal rate, no murmurs. S1 and S2 normal. Radial pulses normal bilaterally. No jugular venous distention.  Respiratory: clear to auscultation bilaterally GI : normal bowel sounds, soft and nontender. No distention.   MSK: extremities warm, well perfused. 1+ bilateral edema.  NEURO: grossly nonfocal exam, moves all extremities. PSYCH: alert and oriented x 3, normal mood and affect.   ASSESSMENT:    1. Lower extremity edema   2. Thrombocytopenia ([place])   3. Dyspnea on exertion   4. Claudication ([place])   5. Chest pain, unspecified type   6. Essential hypertension   7. Chronic diastolic heart failure (HCC)    PLAN:    LE edema - has a history of reduced EF on last echo, borderline EF on recent myoview, and DOE with LE edema. We will recheck an echocardiogram to review EF and diastolic function  Thrombocytopenia - to help guide any potential vascular therapy, we will recheck a CBC as last plt count was low.   Chest pain - resolved with correct inhaler use.   DOE - improved.  Claudication - reviewed distal disease and walking program. Will refer to vascular per recommendation in vascular studies.   HTN - stable, no changes needed  Total time of encounter: 30 minutes total time of encounter, including 20 minutes spent in face-to-face patient care on the date of this encounter. This time includes coordination of care and counseling  regarding above mentioned problem list. Remainder of non-face-to-face time involved reviewing chart documents/testing relevant to the patient encounter and documentation in the medical record. I have independently reviewed documentation from referring provider.    Cherlynn Kaiser, MD [place]  [facility]    Medication Adjustments/Labs and Tests Ordered: Current medicines are reviewed at length with the patient today.  Concerns regarding medicines are outlined above.  No orders of the defined types were placed in this encounter.  No orders of the defined types were placed in this encounter.   There are no Patient Instructions on file for this visit.

## 2019-06-09 NOTE — Patient Instructions (Signed)
Medication Instructions:  No changes *If you need a refill on your cardiac medications before your next appointment, please call your pharmacy*   Lab Work: CBC  If you have labs (blood work) drawn today and your tests are completely normal, you will receive your results only by: Marland Kitchen MyChart Message (if you have MyChart) OR . A paper copy in the mail If you have any lab test that is abnormal or we need to change your treatment, we will call you to review the results.   Testing/Procedures: Will be schedule at [facility] has requested that you have an echocardiogram. Echocardiography is a painless test that uses sound waves to create images of your heart. It provides your doctor with information about the size and shape of your heart and how well your heart's chambers and valves are working. This procedure takes approximately one hour. There are no restrictions for this procedure.     Follow-Up: At [facility], you and your health needs are our priority.  As part of our continuing mission to provide you with exceptional heart care, we have created designated Provider Care Teams.  These Care Teams include your primary Cardiologist (physician) and Advanced Practice Providers (APPs -  Physician Assistants and Nurse Practitioners) who all work together to provide you with the care you need, when you need it.  We recommend signing up for the patient portal called "MyChart".  Sign up information is provided on this After Visit Summary.  MyChart is used to connect with patients for Virtual Visits (Telemedicine).  Patients are able to view lab/test results, encounter notes, upcoming appointments, etc.  Non-urgent messages can be sent to your provider as well.   To learn more about what you can do with MyChart, go to [email or web address].    Your next appointment:   3 month(s)  The format for your next appointment:   In Person  Provider:   Cherlynn Kaiser, MD   Other Instructions You have been referred to Dr Fletcher Anon  - discuss  Lower extremity dopplers

## 2019-06-17 ENCOUNTER — Other Ambulatory Visit (HOSPITAL_COMMUNITY): Payer: Medicare Other

## 2019-06-25 ENCOUNTER — Other Ambulatory Visit (HOSPITAL_COMMUNITY)
Admission: RE | Admit: 2019-06-25 | Discharge: 2019-06-25 | Disposition: A | Discharge status: Home / Self Care | Payer: Medicare Other | Source: Ambulatory Visit | Attending: Family Medicine | Admitting: Family Medicine

## 2019-06-25 ENCOUNTER — Ambulatory Visit (HOSPITAL_BASED_OUTPATIENT_CLINIC_OR_DEPARTMENT_OTHER): Payer: Medicare Other | Admitting: Physician Assistant

## 2019-06-25 ENCOUNTER — Other Ambulatory Visit: Payer: Self-pay

## 2019-06-25 VITALS — BP 116/76 | HR 74 | Ht 74.0 in | Wt 199.2 lb

## 2019-06-25 DIAGNOSIS — G8929 Other chronic pain: Secondary | ICD-10-CM

## 2019-06-25 DIAGNOSIS — R3911 Hesitancy of micturition: Secondary | ICD-10-CM | POA: Diagnosis not present

## 2019-06-25 DIAGNOSIS — J441 Chronic obstructive pulmonary disease with (acute) exacerbation: Secondary | ICD-10-CM | POA: Diagnosis not present

## 2019-06-25 DIAGNOSIS — M546 Pain in thoracic spine: Secondary | ICD-10-CM | POA: Diagnosis not present

## 2019-06-25 LAB — POCT URINALYSIS DIP (CLINITEK)
Bilirubin, UA: NEGATIVE
Blood, UA: NEGATIVE
Glucose, UA: NEGATIVE mg/dL
Ketones, POC UA: NEGATIVE mg/dL
Leukocytes, UA: NEGATIVE
Nitrite, UA: NEGATIVE
POC PROTEIN,UA: NEGATIVE
Spec Grav, UA: 1.025 (ref 1.010–1.025)
Urobilinogen, UA: 0.2 E.U./dL
pH, UA: 6.5 (ref 5.0–8.0)

## 2019-06-25 MED ORDER — TRAMADOL HCL 50 MG PO TABS: 50.0000 mg | ORAL_TABLET | Freq: Two times a day (BID) | ORAL | 0 refills | Status: DC | PRN

## 2019-06-25 MED ORDER — ALBUTEROL SULFATE HFA 108 (90 BASE) MCG/ACT IN AERS: 2.0000 | INHALATION_SPRAY | Freq: Four times a day (QID) | RESPIRATORY_TRACT | 6 refills | Status: DC | PRN

## 2019-06-25 MED FILL — PROAIR HFA 90 MCG INHALER: 108 (90 BAS | 25 days supply | Qty: 153 | Fill #0

## 2019-06-25 MED FILL — traMADol HCL 50 MG TABS: 50 | 5 days supply | Qty: 10 | Fill #0

## 2019-06-25 NOTE — Progress Notes (Signed)
Having trouble urinating in the morning only. States that its get better through out the day.  Needs inhaler and tramadol.

## 2019-06-25 NOTE — Progress Notes (Signed)
Patient ID: [name], male   DOB: [birth year], 59 y.o.   MRN: [id number]   [name], is a 59 y.o. male  [id number]  [id number]   DOB - 09/20/1960  Subjective:  Chief Complaint and HPI: [name] is a 59 y.o. male here today for urinary hesitancy in the mornings.  It improves over the course of the day.  Sometimes he has not been taking his lasix bc he says it makes him "pee too much."  He denies dysuria.  No CP/leg swelling.  Denies SOB except for his baseline with COPD.  Needs albuterol RF.  Needs tramadol for back pain.  He has not had a prescription in a long time.   ROS:   Constitutional:  No f/c, No night sweats, No unexplained weight loss. EENT:  No vision changes, No blurry vision, No hearing changes. No mouth, throat, or ear problems.  Respiratory: No cough, + SOB Cardiac: No CP, no palpitations GI:  No abd pain, No N/V/D. GU: see above Musculoskeletal: No joint pain Neuro: No headache, no dizziness, no motor weakness.  Skin: No rash Endocrine:  No polydipsia. No polyuria.  Psych: Denies SI/HI  No problems updated.  ALLERGIES: Allergies  Allergen Reactions  . No Known Allergies     PAST MEDICAL HISTORY: Past Medical History:  Diagnosis Date  . Asthma   . CAD (coronary artery disease)    a. 09/2013 NSTEMI/PCI: LM nl, LAD 40-50%, D1 100 (2.25 x 28 Vision BMS), LCX min irregs, RI 60-70, 30, RCA 40-50/50-74ms/p, EF 45-50%.  b. cath 12/2014 -occulded BMS in diag, 50% pro to mid LAD, 60% ramus, 30% RCA   . Chest pain 08/2015  . Chronic diastolic CHF (congestive heart failure) ([place])    a. 09/2014 EF 45-50% by LV gram;  b. 01/2014 Echo: EF 55-60%, Gr 1 DD.  Marland Kitchen Cocaine abuse ([place])   . COPD (chronic obstructive pulmonary disease) ([place])   . DVT (deep venous thrombosis) ([place])    "I had ~ 10 in each leg" (12/20/2016)  . Essential hypertension   . Hepatitis C    "clear free over a year now"  . High cholesterol   . Myocardial infarction ([place]) ~ 2014    . Osteoarthritis    a. hands and toes.  . Pneumonia    "several times" (12/20/2016)  . Pulmonary embolism ([place])    a. 2012 - s/p IVC filter;  b. prev on eliquis - noncompliant.  . Shortness of breath dyspnea   . Sinus headache   . Squamous cell skin cancer 07/13/2016   "foot"  . Tobacco abuse     MEDICATIONS AT HOME: Prior to Admission medications   Medication Sig Start Date End Date Taking? Authorizing Provider  albuterol (PROAIR HFA) 108 (90 Base) MCG/ACT inhaler Inhale 2 puffs into the lungs every 6 (six) hours as needed for wheezing or shortness of breath. 06/25/19  Yes Freeman Caldron M, PA-C  albuterol (PROVENTIL) (2.5 MG/3ML) 0.083% nebulizer solution Take 3 mLs (2.5 mg total) by nebulization every 6 (six) hours as needed for wheezing or shortness of breath. 04/21/19  Yes Charlott Rakes, MD  apixaban (ELIQUIS) 5 MG TABS tablet Take 1 tablet (5 mg total) by mouth 2 (two) times daily. 04/21/19  Yes Charlott Rakes, MD  aspirin EC 81 MG tablet Take 1 tablet (81 mg total) by mouth daily. 04/29/19  Yes Elouise Munroe, MD  atorvastatin (LIPITOR) 40 MG tablet Take 1 tablet (40 mg total) by mouth daily  at 6 PM. 04/21/19  Yes Charlott Rakes, MD  budesonide-formoterol (SYMBICORT) 80-4.5 MCG/ACT inhaler Inhale 2 puffs into the lungs 2 (two) times daily. 04/21/19  Yes Charlott Rakes, MD  cetirizine (ZYRTEC) 10 MG tablet Take 1 tablet (10 mg total) by mouth daily. 01/14/19  Yes Charlott Rakes, MD  ergocalciferol (DRISDOL) 1.25 MG (50000 UT) capsule Take 1 capsule (50,000 Units total) by mouth once a week. 04/22/19  Yes Newlin, Enobong, MD  fluticasone (FLONASE) 50 MCG/ACT nasal spray PLACE 2 SPRAYS INTO BOTH NOSTRILS 2 (TWO) TIMES DAILY. 06/05/19  Yes Charlott Rakes, MD  furosemide (LASIX) 40 MG tablet Take 1 tablet (40 mg total) by mouth daily. 04/21/19  Yes Charlott Rakes, MD  gabapentin (NEURONTIN) 300 MG capsule Take 1 capsule (300 mg total) by mouth 2 (two) times daily. 04/21/19  Yes Charlott Rakes, MD  pantoprazole (PROTONIX) 40 MG tablet Take 1 tablet (40 mg total) by mouth daily. 04/21/19  Yes Charlott Rakes, MD  tiZANidine (ZANAFLEX) 4 MG tablet Take 1 tablet (4 mg total) by mouth every 8 (eight) hours as needed for muscle spasms. 08/26/18  Yes Charlott Rakes, MD  traMADol (ULTRAM) 50 MG tablet Take 50 mg by mouth every 12 (twelve) hours as needed.   Yes [provider]  umeclidinium bromide (INCRUSE ELLIPTA) 62.5 MCG/INH AEPB Inhale 1 puff into the lungs daily. 04/21/19  Yes Newlin, [name], MD  nicotine (NICODERM CQ - DOSED IN MG/24 HOURS) 14 mg/24hr patch Place 1 patch (14 mg total) onto the skin daily. Patient not taking: Reported on 06/25/2019 07/23/18   Ladell Pier, MD  nicotine (NICODERM CQ - DOSED IN MG/24 HR) 7 mg/24hr patch Place 1 patch (7 mg total) onto the skin daily. Patient not taking: Reported on 06/25/2019 07/23/18   Ladell Pier, MD     Objective:  EXAM:   Vitals:   06/25/19 1054  BP: 116/76  Pulse: 74  SpO2: 99%  Weight: 199 lb 3.2 oz (90.4 kg)  Height: 6\' 2"  (1.88 m)    General appearance : A&OX3. NAD. Non-toxic-appearing HEENT: Atraumatic and Normocephalic.  PERRLA. EOM intact.  Chest/Lungs:  Breathing-non-labored, Good air entry bilaterally, breath sounds normal without rales, rhonchi, or wheezing  CVS: S1 S2 regular, no murmurs, gallops, rubs  Extremities: Bilateral Lower Ext shows no edema, both legs are warm to touch with = pulse throughout Neurology:  CN II-XII grossly intact, Non focal.   Psych:  TP linear. J/I fair to poor. Normal speech. Appropriate eye contact and blunted affect. Poor historian Skin:  No Rash  Data Review Lab Results  Component Value Date   HGBA1C 5.4 05/16/2016   HGBA1C 5.6 04/27/2014   HGBA1C 5.6 02/10/2012     Assessment & Plan   1. COPD exacerbation (HCC) - albuterol (PROAIR HFA) 108 (90 Base) MCG/ACT inhaler; Inhale 2 puffs into the lungs every 6 (six) hours as needed for wheezing or  shortness of breath.  Dispense: 18 g; Refill: 6  2. Urinary hesitancy - PSA - POCT URINALYSIS DIP (CLINITEK) - Urine cytology ancillary only  3.  Back pain-Rx #10 tramadol     Patient have been counseled extensively about nutrition and exercise  Return for 07/22/2019 appt with PCP.  The patient was given clear instructions to go to ER or return to medical center if symptoms don't improve, worsen or new problems develop. The patient verbalized understanding. The patient was told to call to get lab results if they haven't heard anything in the next  week.     Freeman Caldron, PA-C [facility] and [facility] [place], [place]   06/25/2019, 11:10 AM

## 2019-06-26 ENCOUNTER — Telehealth: Payer: Self-pay | Admitting: Cardiovascular Disease

## 2019-06-26 LAB — URINE CYTOLOGY ANCILLARY ONLY
Chlamydia: NEGATIVE
Comment: NEGATIVE
Comment: NEGATIVE
Comment: NORMAL
Neisseria Gonorrhea: NEGATIVE
Trichomonas: NEGATIVE

## 2019-06-26 LAB — PSA: Prostate Specific Ag, Serum: 0.5 ng/mL (ref 0.0–4.0)

## 2019-06-26 NOTE — Telephone Encounter (Signed)
New Message    Pt is calling and does not know why he has an appointment with Dr Fletcher Anon  He would like for the nurse to call him     Please call back

## 2019-06-26 NOTE — Telephone Encounter (Signed)
Called and spoke with pt notified that he was referred to [place] per Dr.Acharya's last office note to discuss his lower extremity dopplers in person. Pt verbalized understanding. Stated that he had an appointment at our other office on the 13th of April and wanted to know if he needed to keep this. Notified that he was having and Echo on that day at our [address] office and that he needed to keep this appointment, gave him the address for [place] verbalized understanding with no other questions at this time.

## 2019-06-28 ENCOUNTER — Ambulatory Visit: Payer: Medicare Other | Attending: Internal Medicine

## 2019-06-28 DIAGNOSIS — Z23 Encounter for immunization: Secondary | ICD-10-CM

## 2019-06-28 NOTE — Progress Notes (Signed)
   Covid-19 Vaccination Clinic  Name:  [name]    MRN: [id number] DOB: 02/19/1961  06/28/2019  [name] was observed post Covid-19 immunization for 15 minutes without incident. He was provided with Vaccine Information Sheet and instruction to access the V-Safe system.   [name] was instructed to call 911 with any severe reactions post vaccine: Marland Kitchen Difficulty breathing  . Swelling of face and throat  . A fast heartbeat  . A bad rash all over body  . Dizziness and weakness   Immunizations Administered    Name Date Dose VIS Date Route   Pfizer COVID-19 Vaccine 06/28/2019 10:27 AM 0.3 mL 03/07/2019 Intramuscular   Manufacturer: [place]   Lot: [id number]   [place]: [id number]

## 2019-06-30 ENCOUNTER — Encounter: Payer: Self-pay | Admitting: *Deleted

## 2019-07-01 ENCOUNTER — Ambulatory Visit (INDEPENDENT_AMBULATORY_CARE_PROVIDER_SITE_OTHER): Payer: Medicare Other | Admitting: Cardiovascular Disease

## 2019-07-01 ENCOUNTER — Other Ambulatory Visit: Payer: Self-pay

## 2019-07-01 ENCOUNTER — Encounter: Payer: Self-pay | Admitting: Cardiovascular Disease

## 2019-07-01 VITALS — BP 132/80 | HR 80 | Ht 74.0 in | Wt 203.6 lb

## 2019-07-01 DIAGNOSIS — Z720 Tobacco use: Secondary | ICD-10-CM | POA: Diagnosis not present

## 2019-07-01 DIAGNOSIS — I2510 Atherosclerotic heart disease of native coronary artery without angina pectoris: Secondary | ICD-10-CM

## 2019-07-01 DIAGNOSIS — I739 Peripheral vascular disease, unspecified: Secondary | ICD-10-CM | POA: Diagnosis not present

## 2019-07-01 DIAGNOSIS — M48062 Spinal stenosis, lumbar region with neurogenic claudication: Secondary | ICD-10-CM | POA: Diagnosis not present

## 2019-07-01 NOTE — Patient Instructions (Signed)
Medication Instructions:  No changes *If you need a refill on your cardiac medications before your next appointment, please call your pharmacy*   Lab Work: None ordered If you have labs (blood work) drawn today and your tests are completely normal, you will receive your results only by: Marland Kitchen MyChart Message (if you have MyChart) OR . A paper copy in the mail If you have any lab test that is abnormal or we need to change your treatment, we will call you to review the results.   Testing/Procedures: None ordered   Follow-Up: At [facility], you and your health needs are our priority.  As part of our continuing mission to provide you with exceptional heart care, we have created designated Provider Care Teams.  These Care Teams include your primary Cardiologist (physician) and Advanced Practice Providers (APPs -  Physician Assistants and Nurse Practitioners) who all work together to provide you with the care you need, when you need it.  We recommend signing up for the patient portal called "MyChart".  Sign up information is provided on this After Visit Summary.  MyChart is used to connect with patients for Virtual Visits (Telemedicine).  Patients are able to view lab/test results, encounter notes, upcoming appointments, etc.  Non-urgent messages can be sent to your provider as well.   To learn more about what you can do with MyChart, go to [email or web address].    Your next appointment:   Follow up as needed with Dr. Fletcher Anon   Steps to Quit Smoking Smoking tobacco is the leading cause of preventable death. It can affect almost every organ in the body. Smoking puts you and people around you at risk for many serious, long-lasting (chronic) diseases. Quitting smoking can be hard, but it is one of the best things that you can do for your health. It is never too late to quit. How do I get ready to quit? When you decide to quit smoking, make a plan to help you succeed. Before you quit:  Pick a  date to quit. Set a date within the next 2 weeks to give you time to prepare.  Write down the reasons why you are quitting. Keep this list in places where you will see it often.  Tell your family, friends, and co-workers that you are quitting. Their support is important.  Talk with your doctor about the choices that may help you quit.  Find out if your health insurance will pay for these treatments.  Know the people, places, things, and activities that make you want to smoke (triggers). Avoid them. What first steps can I take to quit smoking?  Throw away all cigarettes at home, at work, and in your car.  Throw away the things that you use when you smoke, such as ashtrays and lighters.  Clean your car. Make sure to empty the ashtray.  Clean your home, including curtains and carpets. What can I do to help me quit smoking? Talk with your doctor about taking medicines and seeing a counselor at the same time. You are more likely to succeed when you do both.  If you are pregnant or breastfeeding, talk with your doctor about counseling or other ways to quit smoking. Do not take medicine to help you quit smoking unless your doctor tells you to do so. To quit smoking: Quit right away  Quit smoking totally, instead of slowly cutting back on how much you smoke over a period of time.  Go to counseling. You are more likely to  quit if you go to counseling sessions regularly. Take medicine You may take medicines to help you quit. Some medicines need a prescription, and some you can buy over-the-counter. Some medicines may contain a drug called nicotine to replace the nicotine in cigarettes. Medicines may:  Help you to stop having the desire to smoke (cravings).  Help to stop the problems that come when you stop smoking (withdrawal symptoms). Your doctor may ask you to use:  Nicotine patches, gum, or lozenges.  Nicotine inhalers or sprays.  Non-nicotine medicine that is taken by  mouth. Find resources Find resources and other ways to help you quit smoking and remain smoke-free after you quit. These resources are most helpful when you use them often. They include:  Online chats with a Social worker.  Phone quitlines.  Printed Furniture conservator/restorer.  Support groups or group counseling.  Text messaging programs.  Mobile phone apps. Use apps on your mobile phone or tablet that can help you stick to your quit plan. There are many free apps for mobile phones and tablets as well as websites. Examples include Quit Guide from the [employer] and smokefree.gov  What things can I do to make it easier to quit?   Talk to your family and friends. Ask them to support and encourage you.  Call a phone quitline (1-800-QUIT-NOW), reach out to support groups, or work with a Social worker.  Ask people who smoke to not smoke around you.  Avoid places that make you want to smoke, such as: ? Bars. ? Parties. ? Smoke-break areas at work.  Spend time with people who do not smoke.  Lower the stress in your life. Stress can make you want to smoke. Try these things to help your stress: ? Getting regular exercise. ? Doing deep-breathing exercises. ? Doing yoga. ? Meditating. ? Doing a body scan. To do this, close your eyes, focus on one area of your body at a time from head to toe. Notice which parts of your body are tense. Try to relax the muscles in those areas. How will I feel when I quit smoking? Day 1 to 3 weeks Within the first 24 hours, you may start to have some problems that come from quitting tobacco. These problems are very bad 2-3 days after you quit, but they do not often last for more than 2-3 weeks. You may get these symptoms:  Mood swings.  Feeling restless, nervous, angry, or annoyed.  Trouble concentrating.  Dizziness.  Strong desire for high-sugar foods and nicotine.  Weight gain.  Trouble pooping (constipation).  Feeling like you may vomit (nausea).  Coughing or  a sore throat.  Changes in how the medicines that you take for other issues work in your body.  Depression.  Trouble sleeping (insomnia). Week 3 and afterward After the first 2-3 weeks of quitting, you may start to notice more positive results, such as:  Better sense of smell and taste.  Less coughing and sore throat.  Slower heart rate.  Lower blood pressure.  Clearer skin.  Better breathing.  Fewer sick days. Quitting smoking can be hard. Do not give up if you fail the first time. Some people need to try a few times before they succeed. Do your best to stick to your quit plan, and talk with your doctor if you have any questions or concerns. Summary  Smoking tobacco is the leading cause of preventable death. Quitting smoking can be hard, but it is one of the best things that you can do  for your health.  When you decide to quit smoking, make a plan to help you succeed.  Quit smoking right away, not slowly over a period of time.  When you start quitting, seek help from your doctor, family, or friends. This information is not intended to replace advice given to you by your health care provider. Make sure you discuss any questions you have with your health care provider. Document Revised: 12/06/2018 Document Reviewed: 06/01/2018 Elsevier Patient Education  [place].

## 2019-07-01 NOTE — Progress Notes (Signed)
Cardiology Office Note   Date:  07/01/2019   ID:  [name], DOB March 25, 1961, MRN [id number]  PCP:  Charlott Rakes, MD  Cardiologist:  Dr. Margaretann Loveless  No chief complaint on file.     History of Present Illness: [name] is a 59 y.o. male who was referred by Dr. Margaretann Loveless for evaluation and management of peripheral arterial disease. He has known history of coronary artery disease with previous bare-metal stent placement to first diagonal in 2015 which subsequently occluded with residual mild to moderate nonobstructive disease, history of pulmonary embolism/DVT status post IVC filter in 2012, COPD, chronic diastolic heart failure, essential hypertension, hyperlipidemia, hepatitis C, previous substance abuse with cocaine and tobacco use.  The patient complains of bilateral leg weakness and heaviness with discomfort starting in the thigh area in the lower back.  He usually has significant discomfort if he is sitting in a chair and he has to support himself when standing up.  The pain gradually improves as he walks further on a flat level.  He has no foot pain.  He underwent recent noninvasive vascular studies which showed normal ABI bilaterally.  Distal waveforms were mostly triphasic and biphasic.  There was mostly evidence of tibial disease with occluded posterior tibial artery bilaterally.  Toe pressure was normal on the right side and mildly decreased on the left side.   Past Medical History:  Diagnosis Date  . Asthma   . CAD (coronary artery disease)    a. 09/2013 NSTEMI/PCI: LM nl, LAD 40-50%, D1 100 (2.25 x 28 Vision BMS), LCX min irregs, RI 60-70, 30, RCA 40-50/50-75ms/p, EF 45-50%.  b. cath 12/2014 -occulded BMS in diag, 50% pro to mid LAD, 60% ramus, 30% RCA   . Chest pain 08/2015  . Chronic diastolic CHF (congestive heart failure) ([place])    a. 09/2014 EF 45-50% by LV gram;  b. 01/2014 Echo: EF 55-60%, Gr 1 DD.  Marland Kitchen Cocaine abuse ([place])   . COPD (chronic obstructive  pulmonary disease) ([place])   . DVT (deep venous thrombosis) ([place])    "I had ~ 10 in each leg" (12/20/2016)  . Essential hypertension   . Hepatitis C    "clear free over a year now"  . High cholesterol   . Myocardial infarction ([place]) ~ 2014  . Osteoarthritis    a. hands and toes.  . Pneumonia    "several times" (12/20/2016)  . Pulmonary embolism ([place])    a. 2012 - s/p IVC filter;  b. prev on eliquis - noncompliant.  . Shortness of breath dyspnea   . Sinus headache   . Squamous cell skin cancer 07/13/2016   "foot"  . Tobacco abuse     Past Surgical History:  Procedure Laterality Date  . CARDIAC CATHETERIZATION N/A 01/04/2015   Procedure: Left Heart Cath and Coronary Angiography;  Surgeon: Burnell Blanks, MD;  Location: [facility];  Service: Cardiovascular;  Laterality: N/A;  . CORONARY ANGIOPLASTY WITH STENT PLACEMENT  10/21/13   BMS to D1  . CYST EXCISION N/A 05/10/2016   Procedure: EXCISION OF SEBACEOUS CYST UPPER BACK;  Surgeon: Donnie Mesa, MD;  Location: [facility];  Service: General;  Laterality: N/A;  . FOOT SURGERY Right ~ 06/2016   "said there was skin cancer on it"  . LEFT HEART CATHETERIZATION WITH CORONARY ANGIOGRAM N/A 10/21/2013   Procedure: LEFT HEART CATHETERIZATION WITH CORONARY ANGIOGRAM;  Surgeon: Leonie Man, MD;  [facility] C[facility];  Service: Cardiovascular;  Laterality: N/A;  . VENA CAVA FILTER PLACEMENT  ~ 2007-~ 2017   "1 in my neck; 2 in my wrist"     Current Outpatient Medications  Medication Sig Dispense Refill  . albuterol (PROAIR HFA) 108 (90 Base) MCG/ACT inhaler Inhale 2 puffs into the lungs every 6 (six) hours as needed for wheezing or shortness of breath. 18 g 6  . albuterol (PROVENTIL) (2.5 MG/3ML) 0.083% nebulizer solution Take 3 mLs (2.5 mg total) by nebulization every 6 (six) hours as needed for wheezing or shortness of breath. 75 mL 3  . apixaban (ELIQUIS) 5 MG TABS tablet Take 1 tablet (5 mg total) by mouth 2 (two) times daily.  60 tablet 6  . aspirin EC 81 MG tablet Take 1 tablet (81 mg total) by mouth daily. 90 tablet 3  . atorvastatin (LIPITOR) 40 MG tablet Take 1 tablet (40 mg total) by mouth daily at 6 PM. 30 tablet 6  . budesonide-formoterol (SYMBICORT) 80-4.5 MCG/ACT inhaler Inhale 2 puffs into the lungs 2 (two) times daily. 1 Inhaler 6  . cetirizine (ZYRTEC) 10 MG tablet Take 1 tablet (10 mg total) by mouth daily. 30 tablet 1  . fluticasone (FLONASE) 50 MCG/ACT nasal spray PLACE 2 SPRAYS INTO BOTH NOSTRILS 2 (TWO) TIMES DAILY. 16 g 2  . furosemide (LASIX) 40 MG tablet Take 1 tablet (40 mg total) by mouth daily. 30 tablet 6  . gabapentin (NEURONTIN) 300 MG capsule Take 1 capsule (300 mg total) by mouth 2 (two) times daily. 60 capsule 6  . traMADol (ULTRAM) 50 MG tablet Take 1 tablet (50 mg total) by mouth every 12 (twelve) hours as needed. Prn pain 10 tablet 0  . umeclidinium bromide (INCRUSE ELLIPTA) 62.5 MCG/INH AEPB Inhale 1 puff into the lungs daily. 1 each 6   No current facility-administered medications for this visit.    Allergies:   No known allergies    Social History:  The patient  reports that he has been smoking cigarettes and cigars. He has a 9.75 pack-year smoking history. He has never used smokeless tobacco. He reports current alcohol use of about 6.0 standard drinks of alcohol per week. He reports previous drug use. Drug: Cocaine.   Family History:  The patient's family history includes Allergies in his brother, mother, and sister; Asthma in his mother; Coronary artery disease in his father; Deep vein thrombosis in his brother; Heart attack in his father.    ROS:  Please see the history of present illness.   Otherwise, review of systems are positive for none.   All other systems are reviewed and negative.    PHYSICAL EXAM: VS:  BP 132/80   Pulse 80   Ht 6\' 2"  (1.88 m)   Wt 203 lb 9.6 oz (92.4 kg)   SpO2 97%   BMI 26.14 kg/m  , BMI Body mass index is 26.14 kg/m. GEN: Well nourished,  well developed, in no acute distress  HEENT: normal  Neck: no JVD, carotid bruits, or masses Cardiac: RRR; no murmurs, rubs, or gallops,no edema  Respiratory:  clear to auscultation bilaterally, normal work of breathing GI: soft, nontender, nondistended, + BS MS: no deformity or atrophy  Skin: warm and dry, no rash Neuro:  Strength and sensation are intact Psych: euthymic mood, full affect Vascular: Femoral pulses +2 bilaterally.  Dorsalis pedis is palpable on both sides.   EKG:  EKG is not ordered today.    Recent Labs: 01/21/2019: ALT 16; BUN 7; Creatinine, Ser 0.70; Potassium  3.8; Sodium 142 04/21/2019: BNP 43.5 06/09/2019: Hemoglobin 13.0; Platelets 212    Lipid Panel    Component Value Date/Time   CHOL 145 01/21/2019 0948   TRIG 87 01/21/2019 0948   HDL 57 01/21/2019 0948   CHOLHDL 2.5 01/21/2019 0948   CHOLHDL 3.1 01/01/2015 0208   VLDL 12 01/01/2015 0208   LDLCALC 72 01/21/2019 0948      Wt Readings from Last 3 Encounters:  07/01/19 203 lb 9.6 oz (92.4 kg)  06/25/19 199 lb 3.2 oz (90.4 kg)  06/09/19 201 lb 3.2 oz (91.3 kg)        PAD Screen 04/25/2016  Previous PAD dx? No  Previous surgical procedure? Yes  Dates of procedures 2-3 years, and 5 years   Pain with walking? Yes  Subsides with rest? No  Feet/toe relief with dangling? No  Painful, non-healing ulcers? No  Extremities discolored? No      ASSESSMENT AND PLAN:  1.  Peripheral arterial disease: Recent noninvasive vascular studies showed occluded posterior tibial artery bilaterally.  However, the patient does not seem to be symptomatic from this with no foot pain and no evidence of ulceration.  The pain in his thighs and lower back does not seem to be vascular. I discussed with him the importance of controlling his risk factors and especially smoking cessation. No indication for angiography.  2.  Pseudoclaudication: The patient description of lower back and thigh heaviness and discomfort is  highly suggestive of pseudoclaudication likely due to LS spine disease.  I asked him to follow-up with his primary care physician and discuss possible referral to a back specialist or obtaining an LS spine MRI. The patient does not have evidence of aortoiliac disease to explain this and a vascular etiology is not suspected.  3.  Tobacco use: I discussed with him the importance of smoking cessation.  4.  Coronary artery disease involving native coronary arteries without angina: Continue medical therapy.  5.  Hyperlipidemia: Continue atorvastatin.    Disposition:   FU with me as needed.   Signed,  Kathlyn Sacramento, MD  07/01/2019 8:59 AM    [place]

## 2019-07-08 ENCOUNTER — Other Ambulatory Visit: Payer: Self-pay

## 2019-07-08 ENCOUNTER — Ambulatory Visit (HOSPITAL_COMMUNITY): Payer: Medicare Other | Attending: Internal Medicine

## 2019-07-08 DIAGNOSIS — R600 Localized edema: Secondary | ICD-10-CM

## 2019-07-08 DIAGNOSIS — R0609 Other forms of dyspnea: Secondary | ICD-10-CM

## 2019-07-08 DIAGNOSIS — R0600 Dyspnea, unspecified: Secondary | ICD-10-CM | POA: Diagnosis not present

## 2019-07-08 MED FILL — ELIQUIS 5 MG TABLET: 5 | 30 days supply | Qty: 60 | Fill #2

## 2019-07-08 MED FILL — FUROSEMIDE 40 MG TAB: 40 | 30 days supply | Qty: 30 | Fill #2

## 2019-07-08 MED FILL — SYMBICORT 80-4.5 MCG INH: 80-4.5 | 30 days supply | Qty: 10 | Fill #2

## 2019-07-08 MED FILL — PANTOPRAZOLE SOD DR 40 MG T: 40 | 30 days supply | Qty: 30 | Fill #2

## 2019-07-08 MED FILL — ATORVASTATIN CALCIUM 40 MG: 40 | 30 days supply | Qty: 30 | Fill #2

## 2019-07-08 MED FILL — INCRUSE ELLIPTA 62.5 MCG IN: 62.5 | 30 days supply | Qty: 30 | Fill #2

## 2019-07-08 MED FILL — FLUTICASONE PROP 50 MCG SPR: 50 | 30 days supply | Qty: 16 | Fill #1

## 2019-07-08 MED FILL — GABAPENTIN 300 MG CAPSULE: 300 | 30 days supply | Qty: 60 | Fill #2

## 2019-07-18 ENCOUNTER — Other Ambulatory Visit: Payer: Self-pay | Admitting: Internal Medicine

## 2019-07-18 ENCOUNTER — Telehealth: Payer: Self-pay | Admitting: *Deleted

## 2019-07-18 MED ORDER — METOPROLOL SUCCINATE ER 25 MG PO TB24: 25.0000 mg | ORAL_TABLET | Freq: Every day | ORAL | 3 refills | Status: DC

## 2019-07-18 NOTE — Telephone Encounter (Addendum)
Spoke with pt, aware of recommendations. New script sent to the pharmacy and Follow up scheduled   ----- Message from Elouise Munroe, MD sent at 07/15/2019 10:25 AM EDT ----- No changes from prior study, heart function is low but stable. Please start metoprolol succinate 25 mg daily, and arrange follow up with an APP in 2-3 weeks to review med titration.

## 2019-07-21 MED FILL — METOPROLOL SUCCINATE ER 25: 25 | 90 days supply | Qty: 90 | Fill #0

## 2019-07-22 ENCOUNTER — Ambulatory Visit: Payer: Medicare Other | Admitting: Family Medicine

## 2019-07-22 ENCOUNTER — Ambulatory Visit: Payer: Medicare Other

## 2019-07-29 ENCOUNTER — Ambulatory Visit: Payer: Medicare Other | Attending: Family Medicine | Admitting: Family Medicine

## 2019-07-29 ENCOUNTER — Other Ambulatory Visit: Payer: Self-pay

## 2019-07-29 DIAGNOSIS — G8929 Other chronic pain: Secondary | ICD-10-CM | POA: Diagnosis not present

## 2019-07-29 DIAGNOSIS — M545 Low back pain: Secondary | ICD-10-CM

## 2019-07-29 DIAGNOSIS — Z720 Tobacco use: Secondary | ICD-10-CM

## 2019-07-29 DIAGNOSIS — I739 Peripheral vascular disease, unspecified: Secondary | ICD-10-CM

## 2019-07-29 MED ORDER — TIZANIDINE HCL 4 MG PO TABS: 4.0000 mg | ORAL_TABLET | Freq: Three times a day (TID) | ORAL | 2 refills | Status: DC | PRN

## 2019-07-29 MED FILL — tiZANidine HCL 4 MG TABS: 4 | 20 days supply | Qty: 60 | Fill #0

## 2019-07-29 NOTE — Progress Notes (Signed)
Legs felt heavy yesterday when he was walking. He though that he was going to pass out.  States that he is having back pain.

## 2019-07-29 NOTE — Progress Notes (Signed)
Virtual Visit via Telephone Note  I connected with [name], on 07/29/2019 at 8:47 AM by telephone due to the COVID-19 pandemic and verified that I am speaking with the correct person using two identifiers.   Consent: I discussed the limitations, risks, security and privacy concerns of performing an evaluation and management service by telephone and the availability of in person appointments. I also discussed with the patient that there may be a patient responsible charge related to this service. The patient expressed understanding and agreed to proceed.   Location of Patient: Home  Location of Provider: Clinic   Persons participating in Telemedicine visit: [name]-CMA Dr. Margarita Rana     History of Present Illness: [name] is a 59 year old male with a history of COPD, chronic sinusitis, recurrent DVT and PE (on anticoagulation with Eliquis), coronary artery disease (status post stent, last cardiac cath from 12/2014 revealed two-vessel CAD, medical management recommended), (EF 40-45% from 11/2017),tobacco abuse who presents today for follow-up visit.  His legs felt heavy yesterday and he fell while attempting to obtain groceries.  His back hurts and his right side as well. Lower back pain is a 6/10 now and gets to a 10/10 and is worse in the mornings. Symptoms have been on for years. His muscle relaxants help but make him sedated.  He is wondering if he can be referred to see a specialist. He has leg pains which are described as worse when he walks up a hill but it improves on walking on a level ground. He has no numbness in his lower extremities. Was seen by his vascular doctor last month and notes have been reviewed; he was thought to have pseudoclaudication and no evidence of aortoiliac disease to explain his symptoms.    Lower extremity arterial Doppler from 04/2019 revealed: Summary:  Right: Mixed plaque throughout.  No evidence of  significant arterial stenosis/occlusion.  Probable tibial vessels disease; probable at least one vessel run-off via  the ATA.  Occlusion noted in the PTA.  Probable distal peroneal artery occlusion.   Left: Mixed plaque throughout.  No evidence of significant arterial stenosis/occlusion.  Probable tibial vessels disease; two vessel run-off via the ATA and  peroneal artery.  Occlusion noted in the PTA.  He continues to smoke but states he has cut back and is now smoking 5 cigarettes per day.  He has no chest pain and is compliant with his Eliquis.  COPD is stable. Past Medical History:  Diagnosis Date  . Asthma   . CAD (coronary artery disease)    a. 09/2013 NSTEMI/PCI: LM nl, LAD 40-50%, D1 100 (2.25 x 28 Vision BMS), LCX min irregs, RI 60-70, 30, RCA 40-50/50-79ms/p, EF 45-50%.  b. cath 12/2014 -occulded BMS in diag, 50% pro to mid LAD, 60% ramus, 30% RCA   . Chest pain 08/2015  . Chronic diastolic CHF (congestive heart failure) ([place])    a. 09/2014 EF 45-50% by LV gram;  b. 01/2014 Echo: EF 55-60%, Gr 1 DD.  Marland Kitchen Cocaine abuse ([place])   . COPD (chronic obstructive pulmonary disease) ([place])   . DVT (deep venous thrombosis) ([place])    "I had ~ 10 in each leg" (12/20/2016)  . Essential hypertension   . Hepatitis C    "clear free over a year now"  . High cholesterol   . Myocardial infarction ([place]) ~ 2014  . Osteoarthritis    a. hands and toes.  . Pneumonia    "several times" (12/20/2016)  .  Pulmonary embolism ([place])    a. 2012 - s/p IVC filter;  b. prev on eliquis - noncompliant.  . Shortness of breath dyspnea   . Sinus headache   . Squamous cell skin cancer 07/13/2016   "foot"  . Tobacco abuse    Allergies  Allergen Reactions  . No Known Allergies     Current Outpatient Medications on File Prior to Visit  Medication Sig Dispense Refill  . albuterol (PROAIR HFA) 108 (90 Base) MCG/ACT inhaler Inhale 2 puffs into the lungs every 6 (six) hours as needed for wheezing or shortness of  breath. 18 g 6  . albuterol (PROVENTIL) (2.5 MG/3ML) 0.083% nebulizer solution Take 3 mLs (2.5 mg total) by nebulization every 6 (six) hours as needed for wheezing or shortness of breath. 75 mL 3  . apixaban (ELIQUIS) 5 MG TABS tablet Take 1 tablet (5 mg total) by mouth 2 (two) times daily. 60 tablet 6  . aspirin EC 81 MG tablet Take 1 tablet (81 mg total) by mouth daily. 90 tablet 3  . atorvastatin (LIPITOR) 40 MG tablet Take 1 tablet (40 mg total) by mouth daily at 6 PM. 30 tablet 6  . budesonide-formoterol (SYMBICORT) 80-4.5 MCG/ACT inhaler Inhale 2 puffs into the lungs 2 (two) times daily. 1 Inhaler 6  . cetirizine (ZYRTEC) 10 MG tablet Take 1 tablet (10 mg total) by mouth daily. 30 tablet 1  . fluticasone (FLONASE) 50 MCG/ACT nasal spray PLACE 2 SPRAYS INTO BOTH NOSTRILS 2 (TWO) TIMES DAILY. 16 g 2  . furosemide (LASIX) 40 MG tablet Take 1 tablet (40 mg total) by mouth daily. 30 tablet 6  . gabapentin (NEURONTIN) 300 MG capsule Take 1 capsule (300 mg total) by mouth 2 (two) times daily. 60 capsule 6  . metoprolol succinate (TOPROL XL) 25 MG 24 hr tablet Take 1 tablet (25 mg total) by mouth daily. 90 tablet 3  . traMADol (ULTRAM) 50 MG tablet Take 1 tablet (50 mg total) by mouth every 12 (twelve) hours as needed. Prn pain 10 tablet 0  . umeclidinium bromide (INCRUSE ELLIPTA) 62.5 MCG/INH AEPB Inhale 1 puff into the lungs daily. 1 each 6   No current facility-administered medications on file prior to visit.    Observations/Objective: Awake, alert, oriented x3 Not in acute distress  Assessment and Plan: 1. Chronic bilateral low back pain without sciatica Uncontrolled Discussed physical therapy referral which he is supposed to but would like to see a specialist I have referred him to a spine specialist - DG Lumbar Spine Complete; Future - Ambulatory referral to Spine Surgery - tiZANidine (ZANAFLEX) 4 MG tablet; Take 1 tablet (4 mg total) by mouth every 8 (eight) hours as needed for  muscle spasms.  Dispense: 60 tablet; Refill: 2  2. Tobacco abuse Spent 3 minutes counseling on smoking cessation but he is not ready to quit Advised that smoking cessation will retard   3. Peripheral arterial disease ([place]) Uncontrolled with intermittent pain Evaluated by vascular pain thought to be neurogenic claudication Counseled on smoking cessation  Follow Up Instructions: 3 months for chronic disease management   I discussed the assessment and treatment plan with the patient. The patient was provided an opportunity to ask questions and all were answered. The patient agreed with the plan and demonstrated an understanding of the instructions.   The patient was advised to call back or seek an in-person evaluation if the symptoms worsen or if the condition fails to improve as anticipated.  I provided 16 minutes total of non-face-to-face time during this encounter including median intraservice time, reviewing previous notes, investigations, ordering medications, medical decision making, coordinating care and patient verbalized understanding at the end of the visit.     Charlott Rakes, MD, FAAFP. [facility] [place], [place]   07/29/2019, 8:47 AM

## 2019-07-30 ENCOUNTER — Encounter: Payer: Self-pay | Admitting: Family Medicine

## 2019-08-04 NOTE — Progress Notes (Signed)
Cardiology Clinic Note   Patient Name: [name] Date of Encounter: 08/05/2019  Primary Care Provider:  Charlott Rakes, MD Primary Cardiologist:  Elouise Munroe, MD  Patient Profile    [name] 59 year old male presents the clinic today for follow-up evaluation of his coronary artery disease, chronic diastolic heart failure, and hypertension.  Past Medical History    Past Medical History:  Diagnosis Date  . Asthma   . CAD (coronary artery disease)    a. 09/2013 NSTEMI/PCI: LM nl, LAD 40-50%, D1 100 (2.25 x 28 Vision BMS), LCX min irregs, RI 60-70, 30, RCA 40-50/50-77ms/p, EF 45-50%.  b. cath 12/2014 -occulded BMS in diag, 50% pro to mid LAD, 60% ramus, 30% RCA   . Chest pain 08/2015  . Chronic diastolic CHF (congestive heart failure) ([place])    a. 09/2014 EF 45-50% by LV gram;  b. 01/2014 Echo: EF 55-60%, Gr 1 DD.  Marland Kitchen Cocaine abuse ([place])   . COPD (chronic obstructive pulmonary disease) ([place])   . DVT (deep venous thrombosis) ([place])    "I had ~ 10 in each leg" (12/20/2016)  . Essential hypertension   . Hepatitis C    "clear free over a year now"  . High cholesterol   . Myocardial infarction ([place]) ~ 2014  . Osteoarthritis    a. hands and toes.  . Pneumonia    "several times" (12/20/2016)  . Pulmonary embolism ([place])    a. 2012 - s/p IVC filter;  b. prev on eliquis - noncompliant.  . Shortness of breath dyspnea   . Sinus headache   . Squamous cell skin cancer 07/13/2016   "foot"  . Tobacco abuse    Past Surgical History:  Procedure Laterality Date  . CARDIAC CATHETERIZATION N/A 01/04/2015   Procedure: Left Heart Cath and Coronary Angiography;  Surgeon: Burnell Blanks, MD;  Location: [facility];  Service: Cardiovascular;  Laterality: N/A;  . CORONARY ANGIOPLASTY WITH STENT PLACEMENT  10/21/13   BMS to D1  . CYST EXCISION N/A 05/10/2016   Procedure: EXCISION OF SEBACEOUS CYST UPPER BACK;  Surgeon: Donnie Mesa, MD;  Location: [facility];  Service:  General;  Laterality: N/A;  . FOOT SURGERY Right ~ 06/2016   "said there was skin cancer on it"  . LEFT HEART CATHETERIZATION WITH CORONARY ANGIOGRAM N/A 10/21/2013   Procedure: LEFT HEART CATHETERIZATION WITH CORONARY ANGIOGRAM;  Surgeon: Leonie Man, MD;  Location: [facility] CATH [facility];  Service: Cardiovascular;  Laterality: N/A;  . VENA CAVA FILTER PLACEMENT  ~ 2007-~ 2017   "1 in my neck; 2 in my wrist"    Allergies  Allergies  Allergen Reactions  . No Known Allergies     History of Present Illness    [name] has a PMH of coronary artery disease status post bare-metal stent to D1 in 2015 (noted to be chronically occluded by cardiac catheterization 10/16 with scattered 30% RCA stenosis, 20% circumflex, and 50% proximal mid LAD).  History also includes PE/DVT status post IVC filter placement in 2012, COPD, chronic diastolic CHF with an EF of 55-60% by echocardiogram 10/16, hypertension, HLD, chronic hepatitis C, tobacco use and substance use (positive for cocaine on UDS 11/17).  He was seen by Dr. Sophronia Simas for bilateral leg weakness, and heaviness in his thighs 4/21.  He indicated that his pain was significant with sitting in a chair.  He also indicated that the pain would gradually improve with walking.  He denied foot pain.  His ABIs showed normal flow bilaterally.  His toe pressure was normal in the right side mildly decreased on the left side.  He underwent echocardiogram on 07/08/2019.  It showed no significant changes from prior study and his EF was 40-45%.  He was started on metoprolol at that time.  Presents to the clinic today for follow-up evaluation and states he had an episode up chest pain while carrying his groceries to his car last week.  He states he also notices chest discomfort with walking uphill.  His chest discomfort was relieved by rest.  He did not take any sublingual nitroglycerin.  He states he does not have any sublingual nitroglycerin.  He states he continues to smoke but  has not recently used any street drugs.  I will order a nuclear stress test, give him salty 6, and have him follow-up in 1 month for further evaluation.  Today he denies chest pain, shortness of breath, lower extremity edema, fatigue, palpitations, melena, hematuria, hemoptysis, diaphoresis, weakness, presyncope, syncope, orthopnea, and PND.    Home Medications    Prior to Admission medications   Medication Sig Start Date End Date Taking? Authorizing Provider  albuterol (PROAIR HFA) 108 (90 Base) MCG/ACT inhaler Inhale 2 puffs into the lungs every 6 (six) hours as needed for wheezing or shortness of breath. 06/25/19   Argentina Donovan, PA-C  albuterol (PROVENTIL) (2.5 MG/3ML) 0.083% nebulizer solution Take 3 mLs (2.5 mg total) by nebulization every 6 (six) hours as needed for wheezing or shortness of breath. 04/21/19   Charlott Rakes, MD  apixaban (ELIQUIS) 5 MG TABS tablet Take 1 tablet (5 mg total) by mouth 2 (two) times daily. 04/21/19   Charlott Rakes, MD  aspirin EC 81 MG tablet Take 1 tablet (81 mg total) by mouth daily. 04/29/19   Elouise Munroe, MD  atorvastatin (LIPITOR) 40 MG tablet Take 1 tablet (40 mg total) by mouth daily at 6 PM. 04/21/19   Charlott Rakes, MD  budesonide-formoterol (SYMBICORT) 80-4.5 MCG/ACT inhaler Inhale 2 puffs into the lungs 2 (two) times daily. 04/21/19   Charlott Rakes, MD  cetirizine (ZYRTEC) 10 MG tablet Take 1 tablet (10 mg total) by mouth daily. 01/14/19   Charlott Rakes, MD  fluticasone (FLONASE) 50 MCG/ACT nasal spray PLACE 2 SPRAYS INTO BOTH NOSTRILS 2 (TWO) TIMES DAILY. 06/05/19   Charlott Rakes, MD  furosemide (LASIX) 40 MG tablet Take 1 tablet (40 mg total) by mouth daily. 04/21/19   Charlott Rakes, MD  gabapentin (NEURONTIN) 300 MG capsule Take 1 capsule (300 mg total) by mouth 2 (two) times daily. 04/21/19   Charlott Rakes, MD  metoprolol succinate (TOPROL XL) 25 MG 24 hr tablet Take 1 tablet (25 mg total) by mouth daily. 07/18/19   Elouise Munroe, MD  tiZANidine (ZANAFLEX) 4 MG tablet Take 1 tablet (4 mg total) by mouth every 8 (eight) hours as needed for muscle spasms. 07/29/19   Charlott Rakes, MD  traMADol (ULTRAM) 50 MG tablet Take 1 tablet (50 mg total) by mouth every 12 (twelve) hours as needed. Prn pain 06/25/19   Argentina Donovan, PA-C  umeclidinium bromide (INCRUSE ELLIPTA) 62.5 MCG/INH AEPB Inhale 1 puff into the lungs daily. 04/21/19   Charlott Rakes, MD    Family History    Family History  Problem Relation Age of Onset  . Asthma Mother   . Allergies Mother   . Allergies Sister   . Allergies Brother   . Deep vein thrombosis Brother  two brothers with recurrent DVT  . Heart attack Father        a.60s b. deceased in his 75s  . Coronary artery disease Father   . Colon cancer Neg Hx   . Liver cancer Neg Hx   . Esophageal cancer Neg Hx   . Colon polyps Neg Hx   . Rectal cancer Neg Hx   . Pancreatic cancer Neg Hx   . Stomach cancer Neg Hx    He indicated that the status of his mother is unknown. He indicated that the status of his father is unknown. He indicated that the status of his sister is unknown. He indicated that the status of his neg hx is unknown.  Social History    Social History   Socioeconomic History  . Marital status: Single    Spouse name: Not on file  . Number of children: Not on file  . Years of education: Not on file  . Highest education level: Not on file  Occupational History  . Occupation: unemployed  Tobacco Use  . Smoking status: Current Every Day Smoker    Packs/day: 0.25    Years: 39.00    Pack years: 9.75    Types: Cigarettes, Cigars  . Smokeless tobacco: Never Used  Substance and Sexual Activity  . Alcohol use: Yes    Alcohol/week: 6.0 standard drinks    Types: 2 Cans of beer, 4 Shots of liquor per week  . Drug use: Not Currently    Types: Cocaine    Comment: 12/20/2016 "might have used some the other day; I'm not sure"  . Sexual activity: Not Currently    Other Topics Concern  . Not on file  Social History Narrative   Lives in [place].   Social Determinants of Health   Financial Resource Strain:   . Difficulty of Paying Living Expenses:   Food Insecurity:   . Worried About Charity fundraiser in the Last Year:   . Arboriculturist in the Last Year:   Transportation Needs:   . Film/video editor (Medical):   Marland Kitchen Lack of Transportation (Non-Medical):   Physical Activity:   . Days of Exercise per Week:   . Minutes of Exercise per Session:   Stress:   . Feeling of Stress :   Social Connections:   . Frequency of Communication with Friends and Family:   . Frequency of Social Gatherings with Friends and Family:   . Attends Religious Services:   . Active Member of Clubs or Organizations:   . Attends Archivist Meetings:   Marland Kitchen Marital Status:   Intimate Partner Violence:   . Fear of Current or Ex-Partner:   . Emotionally Abused:   Marland Kitchen Physically Abused:   . Sexually Abused:      Review of Systems    General:  No chills, fever, night sweats or weight changes.  Cardiovascular:  No chest pain, dyspnea on exertion, edema, orthopnea, palpitations, paroxysmal nocturnal dyspnea. Dermatological: No rash, lesions/masses Respiratory: No cough, dyspnea Urologic: No hematuria, dysuria Abdominal:   No nausea, vomiting, diarrhea, bright red blood per rectum, melena, or hematemesis Neurologic:  No visual changes, wkns, changes in mental status. All other systems reviewed and are otherwise negative except as noted above.  Physical Exam    VS:  BP 110/70 (BP Location: Left Arm, Patient Position: Sitting, Cuff Size: Normal)   Pulse 78   Temp (!) 96.3 F (35.7 C)   Ht 6\' 2"  (1.88 m)  Wt 212 lb (96.2 kg)   BMI 27.22 kg/m  , BMI Body mass index is 27.22 kg/m. GEN: Well nourished, well developed, in no acute distress. HEENT: normal. Neck: Supple, no JVD, carotid bruits, or masses. Cardiac: RRR, no murmurs, rubs, or gallops. No  clubbing, cyanosis, edema.  Radials/DP/PT 2+ and equal bilaterally.  Respiratory:  Respirations regular and unlabored, clear to auscultation bilaterally. GI: Soft, nontender, nondistended, BS + x 4. MS: no deformity or atrophy. Skin: warm and dry, no rash. Neuro:  Strength and sensation are intact. Psych: Normal affect.  Accessory Clinical Findings    ECG personally reviewed by me today-normal sinus rhythm no ST or T wave deviation 78 bpm- No acute changes  Echocardiogram 07/08/2019 IMPRESSIONS    1. Left ventricular ejection fraction, by estimation, is 40 to 45%. The  left ventricle has mild to moderately decreased function. The left  ventricle demonstrates global hypokinesis. There is mild left ventricular  hypertrophy. Left ventricular diastolic  parameters are consistent with Grade I diastolic dysfunction (impaired  relaxation). There is abnormal septal motion.  2. Right ventricular systolic function is normal. The right ventricular  size is normal.  3. The mitral valve is normal in structure. Trivial mitral valve  regurgitation. No evidence of mitral stenosis.  4. The aortic valve is tricuspid. Aortic valve regurgitation is not  visualized. No aortic stenosis is present.  5. Aortic dilatation noted. There is mild dilatation of the aortic root  measuring 40 mm.   Comparison(s): A prior study was performed on 11/29/2017. No significant  change from prior study. Prior images reviewed side by side.   Assessment & Plan   1.  Stable angina/chest pain -has been noticing chest pain with walking up hills and carrying groceries.  Pain relieved with rest.  Has not needed any nitroglycerin. Continue current medical therapy Order sublingual nitroglycerin Order nuclear stress test Heart healthy low-sodium diet-salty 6 given  Chronic combined systolic and diastolic heart failure-echocardiogram 4/21 showed LVEF of [id number], grade 1 diastolic dysfunction, mitral valve regurgitation, and  mild aortic root dilation. Continue metoprolol succinate 25 mg daily Heart healthy low-sodium diet Increase physical activity as tolerated Daily weights Lower extremity support stockings  Lower extremity edema-euvolemic today.  Weight today 212 pounds.  Follow-up echocardiogram showed no significant changes from previous study details listed above. Continue furosemide Lower extremity support stockings Elevate extremities when not active Daily weights  Essential hypertension-BP today 110/70.  Well-controlled at home Continue current medical therapy Heart healthy low-sodium diet-salty 6 given Increase physical activity as tolerated  Dyspnea on exertion-no increased work of breathing with physical activity. Daily weights Increase physical activity as tolerated Lower extremity support stockings Heart healthy low-sodium diet-salty 6 given  Disposition follow-up with me in 1 month.  Jossie Ng. Jammie Troup NP-C    08/05/2019, 10:04 AM [place] [address] Office [phone] Fax [phone]

## 2019-08-05 ENCOUNTER — Other Ambulatory Visit: Payer: Self-pay

## 2019-08-05 ENCOUNTER — Encounter: Payer: Self-pay | Admitting: General Practice

## 2019-08-05 ENCOUNTER — Ambulatory Visit (INDEPENDENT_AMBULATORY_CARE_PROVIDER_SITE_OTHER): Payer: Medicare Other | Admitting: General Practice

## 2019-08-05 ENCOUNTER — Other Ambulatory Visit: Payer: Self-pay | Admitting: General Practice

## 2019-08-05 ENCOUNTER — Encounter (HOSPITAL_COMMUNITY): Payer: Self-pay | Admitting: General Practice

## 2019-08-05 VITALS — BP 110/70 | HR 78 | Temp 96.3°F | Ht 74.0 in | Wt 212.0 lb

## 2019-08-05 DIAGNOSIS — I208 Other forms of angina pectoris: Secondary | ICD-10-CM

## 2019-08-05 DIAGNOSIS — I10 Essential (primary) hypertension: Secondary | ICD-10-CM

## 2019-08-05 DIAGNOSIS — I5032 Chronic diastolic (congestive) heart failure: Secondary | ICD-10-CM

## 2019-08-05 DIAGNOSIS — R600 Localized edema: Secondary | ICD-10-CM | POA: Diagnosis not present

## 2019-08-05 DIAGNOSIS — R0600 Dyspnea, unspecified: Secondary | ICD-10-CM

## 2019-08-05 DIAGNOSIS — R0609 Other forms of dyspnea: Secondary | ICD-10-CM

## 2019-08-05 DIAGNOSIS — R079 Chest pain, unspecified: Secondary | ICD-10-CM

## 2019-08-05 MED ORDER — ISOSORBIDE MONONITRATE ER 30 MG PO TB24: 15.0000 mg | ORAL_TABLET | Freq: Every day | ORAL | 6 refills | Status: DC

## 2019-08-05 MED ORDER — NITROGLYCERIN 0.4 MG SL SUBL: 0.4000 mg | SUBLINGUAL_TABLET | SUBLINGUAL | 3 refills | Status: DC | PRN

## 2019-08-05 NOTE — Patient Instructions (Addendum)
Medication Instructions:  NEW RX SENT FOR NITROGLYCERIN >>>START ISOSORBIDE 15MG  (1/2 TAB) DAILY *If you need a refill on your cardiac medications before your next appointment, please call your pharmacy*  Testing/Procedures: Your physician has requested that you have a lexiscan myoview. A cardiac stress test is a cardiological test that measures the heart's ability to respond to external stress in a controlled clinical environment. The stress response is induced by intravenous pharmacological stimulation.   PLEASE HOLD: METOPROLOL SUCCINATE 25MG  the day of the testing  Special Instructions PLEASE READ AND FOLLOW SALTY 6-ATTACHED  Follow-Up: Your next appointment:  1 month(s)/AFTER [place] In Person with JESSE CLEAVER, FNP-C  At [facility], you and your health needs are our priority.  As part of our continuing mission to provide you with exceptional heart care, we have created designated Provider Care Teams.  These Care Teams include your primary Cardiologist (physician) and Advanced Practice Providers (APPs -  Physician Assistants and Nurse Practitioners) who all work together to provide you with the care you need, when you need it.  We recommend signing up for the patient portal called "MyChart".  Sign up information is provided on this After Visit Summary.  MyChart is used to connect with patients for Virtual Visits (Telemedicine).  Patients are able to view lab/test results, encounter notes, upcoming appointments, etc.  Non-urgent messages can be sent to your provider as well.   To learn more about what you can do with MyChart, go to [email or web address].

## 2019-08-05 NOTE — Addendum Note (Signed)
Addended by: Waylan Rocher on: 08/05/2019 05:11 PM   Modules accepted: Orders

## 2019-08-06 ENCOUNTER — Ambulatory Visit (HOSPITAL_COMMUNITY)
Admission: RE | Admit: 2019-08-06 | Discharge: 2019-08-06 | Disposition: A | Discharge status: Home / Self Care | Payer: Medicare Other | Source: Ambulatory Visit | Attending: Family Medicine | Admitting: Family Medicine

## 2019-08-06 ENCOUNTER — Other Ambulatory Visit: Payer: Self-pay | Admitting: Family Medicine

## 2019-08-06 DIAGNOSIS — M545 Low back pain, unspecified: Secondary | ICD-10-CM

## 2019-08-06 DIAGNOSIS — G8929 Other chronic pain: Secondary | ICD-10-CM | POA: Insufficient documentation

## 2019-08-06 DIAGNOSIS — J441 Chronic obstructive pulmonary disease with (acute) exacerbation: Secondary | ICD-10-CM

## 2019-08-07 DIAGNOSIS — M48062 Spinal stenosis, lumbar region with neurogenic claudication: Secondary | ICD-10-CM | POA: Diagnosis not present

## 2019-08-08 ENCOUNTER — Telehealth: Payer: Self-pay

## 2019-08-08 NOTE — Telephone Encounter (Signed)
Rx for prednisone recently d/c d/t completion. If pt is requesting more prednisone, authorization will need to come from Dr. Margarita Rana.

## 2019-08-08 NOTE — Telephone Encounter (Signed)
-----   Message from Charlott Rakes, MD sent at 08/07/2019  8:39 AM EDT ----- X-ray reveals degenerative changes (arthritis in his lumbar spine).  I have referred him to a spine specialist who will follow up on his low back pain.

## 2019-08-08 NOTE — Telephone Encounter (Signed)
Patient name and DOB has been verified Patient was informed of lab results. Patient had no questions.  

## 2019-08-11 ENCOUNTER — Other Ambulatory Visit: Payer: Self-pay | Admitting: Family Medicine

## 2019-08-11 DIAGNOSIS — J441 Chronic obstructive pulmonary disease with (acute) exacerbation: Secondary | ICD-10-CM

## 2019-08-11 MED FILL — PANTOPRAZOLE SOD DR 40 MG T: 40 | 30 days supply | Qty: 30 | Fill #3

## 2019-08-11 MED FILL — ATORVASTATIN CALCIUM 40 MG: 40 | 90 days supply | Qty: 90 | Fill #3

## 2019-08-11 MED FILL — FLUTICASONE PROP 50 MCG SPR: 50 | 30 days supply | Qty: 16 | Fill #2

## 2019-08-11 MED FILL — ALBUTEROL SULFATE HFA 108 (: 108 (90 BAS | 16 days supply | Qty: 9 | Fill #0

## 2019-08-11 MED FILL — GABAPENTIN 300 MG CAPSULE: 300 | 90 days supply | Qty: 180 | Fill #3

## 2019-08-11 MED FILL — ELIQUIS 5 MG TABLET: 5 | 90 days supply | Qty: 180 | Fill #3

## 2019-08-12 MED FILL — ALBUTEROL SUL 2.5 MG/3 ML S: (2.5 MG/3ML | 6 days supply | Qty: 75 | Fill #2

## 2019-08-15 ENCOUNTER — Inpatient Hospital Stay (HOSPITAL_COMMUNITY): Admission: RE | Admit: 2019-08-15 | Payer: Medicare Other | Source: Ambulatory Visit

## 2019-08-21 DIAGNOSIS — M48062 Spinal stenosis, lumbar region with neurogenic claudication: Secondary | ICD-10-CM | POA: Diagnosis not present

## 2019-08-21 DIAGNOSIS — G8220 Paraplegia, unspecified: Secondary | ICD-10-CM | POA: Insufficient documentation

## 2019-08-21 DIAGNOSIS — R29898 Other symptoms and signs involving the musculoskeletal system: Secondary | ICD-10-CM | POA: Diagnosis not present

## 2019-08-21 DIAGNOSIS — M545 Low back pain: Secondary | ICD-10-CM | POA: Diagnosis not present

## 2019-08-22 MED FILL — INCRUSE ELLIPTA 62.5 MCG IN: 62.5 | 30 days supply | Qty: 30 | Fill #3

## 2019-09-13 ENCOUNTER — Encounter (HOSPITAL_COMMUNITY): Payer: Self-pay | Admitting: Emergency Medicine

## 2019-09-13 ENCOUNTER — Other Ambulatory Visit: Payer: Self-pay

## 2019-09-13 ENCOUNTER — Inpatient Hospital Stay (HOSPITAL_COMMUNITY)
Admission: AD | Admit: 2019-09-13 | Discharge: 2019-09-17 | DRG: 191 | Disposition: A | Discharge status: Home / Self Care | Payer: Medicare Other | Attending: Internal Medicine | Admitting: Internal Medicine

## 2019-09-13 ENCOUNTER — Emergency Department (HOSPITAL_COMMUNITY): Payer: Medicare Other

## 2019-09-13 DIAGNOSIS — Z7982 Long term (current) use of aspirin: Secondary | ICD-10-CM | POA: Diagnosis not present

## 2019-09-13 DIAGNOSIS — F17210 Nicotine dependence, cigarettes, uncomplicated: Secondary | ICD-10-CM | POA: Diagnosis present

## 2019-09-13 DIAGNOSIS — Z79899 Other long term (current) drug therapy: Secondary | ICD-10-CM

## 2019-09-13 DIAGNOSIS — E785 Hyperlipidemia, unspecified: Secondary | ICD-10-CM | POA: Diagnosis present

## 2019-09-13 DIAGNOSIS — I110 Hypertensive heart disease with heart failure: Secondary | ICD-10-CM | POA: Diagnosis present

## 2019-09-13 DIAGNOSIS — Z86711 Personal history of pulmonary embolism: Secondary | ICD-10-CM | POA: Diagnosis not present

## 2019-09-13 DIAGNOSIS — D649 Anemia, unspecified: Secondary | ICD-10-CM | POA: Diagnosis present

## 2019-09-13 DIAGNOSIS — J441 Chronic obstructive pulmonary disease with (acute) exacerbation: Secondary | ICD-10-CM | POA: Diagnosis not present

## 2019-09-13 DIAGNOSIS — I252 Old myocardial infarction: Secondary | ICD-10-CM | POA: Diagnosis not present

## 2019-09-13 DIAGNOSIS — K219 Gastro-esophageal reflux disease without esophagitis: Secondary | ICD-10-CM | POA: Diagnosis not present

## 2019-09-13 DIAGNOSIS — Z85828 Personal history of other malignant neoplasm of skin: Secondary | ICD-10-CM | POA: Diagnosis not present

## 2019-09-13 DIAGNOSIS — J45901 Unspecified asthma with (acute) exacerbation: Secondary | ICD-10-CM | POA: Diagnosis not present

## 2019-09-13 DIAGNOSIS — Z8249 Family history of ischemic heart disease and other diseases of the circulatory system: Secondary | ICD-10-CM

## 2019-09-13 DIAGNOSIS — Z20822 Contact with and (suspected) exposure to covid-19: Secondary | ICD-10-CM | POA: Diagnosis present

## 2019-09-13 DIAGNOSIS — I2510 Atherosclerotic heart disease of native coronary artery without angina pectoris: Secondary | ICD-10-CM | POA: Diagnosis not present

## 2019-09-13 DIAGNOSIS — Z7951 Long term (current) use of inhaled steroids: Secondary | ICD-10-CM

## 2019-09-13 DIAGNOSIS — R000 Tachycardia, unspecified: Secondary | ICD-10-CM | POA: Diagnosis not present

## 2019-09-13 DIAGNOSIS — Z7901 Long term (current) use of anticoagulants: Secondary | ICD-10-CM | POA: Diagnosis not present

## 2019-09-13 DIAGNOSIS — I5042 Chronic combined systolic (congestive) and diastolic (congestive) heart failure: Secondary | ICD-10-CM | POA: Diagnosis not present

## 2019-09-13 DIAGNOSIS — Z86718 Personal history of other venous thrombosis and embolism: Secondary | ICD-10-CM | POA: Diagnosis not present

## 2019-09-13 DIAGNOSIS — R0602 Shortness of breath: Secondary | ICD-10-CM | POA: Diagnosis not present

## 2019-09-13 DIAGNOSIS — J439 Emphysema, unspecified: Secondary | ICD-10-CM | POA: Diagnosis not present

## 2019-09-13 DIAGNOSIS — I5030 Unspecified diastolic (congestive) heart failure: Secondary | ICD-10-CM | POA: Diagnosis not present

## 2019-09-13 DIAGNOSIS — Z825 Family history of asthma and other chronic lower respiratory diseases: Secondary | ICD-10-CM

## 2019-09-13 LAB — BASIC METABOLIC PANEL
Anion gap: 7 (ref 5–15)
BUN: 16 mg/dL (ref 6–20)
CO2: 21 mmol/L — ABNORMAL LOW (ref 22–32)
Calcium: 8.9 mg/dL (ref 8.9–10.3)
Chloride: 109 mmol/L (ref 98–111)
Creatinine, Ser: 0.89 mg/dL (ref 0.61–1.24)
GFR calc Af Amer: >60 mL/min (ref >60)
GFR calc non Af Amer: >60 mL/min (ref >60)
Glucose, Bld: 98 mg/dL (ref 70–99)
Potassium: 4.8 mmol/L (ref 3.5–5.1)
Sodium: 137 mmol/L (ref 135–145)

## 2019-09-13 LAB — CBC WITH DIFFERENTIAL/PLATELET
Abs Immature Granulocytes: 0.03 10*3/uL (ref 0.00–0.07)
Basophils Absolute: 0 10*3/uL (ref 0.0–0.1)
Basophils Relative: 1 %
Eosinophils Absolute: 0.6 10*3/uL — ABNORMAL HIGH (ref 0.0–0.5)
Eosinophils Relative: 9 %
HCT: 36.5 % — ABNORMAL LOW (ref 39.0–52.0)
Hemoglobin: 11.9 g/dL — ABNORMAL LOW (ref 13.0–17.0)
Immature Granulocytes: 1 %
Lymphocytes Relative: 29 %
Lymphs Abs: 1.7 10*3/uL (ref 0.7–4.0)
MCH: 31.1 pg (ref 26.0–34.0)
MCHC: 32.6 g/dL (ref 30.0–36.0)
MCV: 95.3 fL (ref 80.0–100.0)
Monocytes Absolute: 0.6 10*3/uL (ref 0.1–1.0)
Monocytes Relative: 11 %
Neutro Abs: 2.9 10*3/uL (ref 1.7–7.7)
Neutrophils Relative %: 49 %
Platelets: 193 10*3/uL (ref 150–400)
RBC: 3.83 MIL/uL — ABNORMAL LOW (ref 4.22–5.81)
RDW: 14.6 % (ref 11.5–15.5)
WBC: 5.8 10*3/uL (ref 4.0–10.5)
nRBC: 0 % (ref 0.0–0.2)

## 2019-09-13 LAB — RESPIRATORY PANEL BY PCR

## 2019-09-13 LAB — SARS CORONAVIRUS 2 BY RT PCR (HOSPITAL ORDER, PERFORMED IN ~~LOC~~ HOSPITAL LAB): SARS Coronavirus 2: NEGATIVE

## 2019-09-13 MED ORDER — ALBUTEROL (5 MG/ML) CONTINUOUS INHALATION SOLN
10.0000 mg/h | INHALATION_SOLUTION | Freq: Once | RESPIRATORY_TRACT | Status: AC
  Administered 2019-09-13: 10 mg/h via RESPIRATORY_TRACT

## 2019-09-13 MED ORDER — AZITHROMYCIN 250 MG PO TABS
250.0000 mg | ORAL_TABLET | Freq: Every day | ORAL | Status: AC
  Administered 2019-09-14 – 2019-09-17 (×4): 250 mg via ORAL
  Filled 2019-09-13 (×4): qty 1

## 2019-09-13 MED ORDER — TIZANIDINE HCL 4 MG PO TABS
4.0000 mg | ORAL_TABLET | Freq: Three times a day (TID) | ORAL | Status: DC | PRN
  Administered 2019-09-13 – 2019-09-15 (×3): 4 mg via ORAL
  Filled 2019-09-13 (×4): qty 1

## 2019-09-13 MED ORDER — METOPROLOL SUCCINATE ER 25 MG PO TB24
25.0000 mg | ORAL_TABLET | Freq: Every day | ORAL | Status: DC
  Administered 2019-09-14 – 2019-09-17 (×4): 25 mg via ORAL
  Filled 2019-09-13 (×4): qty 1

## 2019-09-13 MED ORDER — LORATADINE 10 MG PO TABS
10.0000 mg | ORAL_TABLET | Freq: Every day | ORAL | Status: DC
  Administered 2019-09-14 – 2019-09-17 (×4): 10 mg via ORAL
  Filled 2019-09-13 (×4): qty 1

## 2019-09-13 MED ORDER — SODIUM CHLORIDE 0.9 % IV SOLN: INTRAVENOUS | Status: DC

## 2019-09-13 MED ORDER — TAMSULOSIN HCL 0.4 MG PO CAPS
0.4000 mg | ORAL_CAPSULE | Freq: Every day | ORAL | Status: DC
  Administered 2019-09-13 – 2019-09-17 (×5): 0.4 mg via ORAL
  Filled 2019-09-13 (×5): qty 1

## 2019-09-13 MED ORDER — GABAPENTIN 300 MG PO CAPS
300.0000 mg | ORAL_CAPSULE | Freq: Two times a day (BID) | ORAL | Status: DC
  Administered 2019-09-13 – 2019-09-17 (×8): 300 mg via ORAL
  Filled 2019-09-13 (×8): qty 1

## 2019-09-13 MED ORDER — FUROSEMIDE 20 MG PO TABS
20.0000 mg | ORAL_TABLET | ORAL | Status: DC
  Administered 2019-09-13 – 2019-09-17 (×4): 20 mg via ORAL
  Filled 2019-09-13 (×4): qty 1

## 2019-09-13 MED ORDER — BENZONATATE 100 MG PO CAPS
100.0000 mg | ORAL_CAPSULE | Freq: Three times a day (TID) | ORAL | Status: DC | PRN
  Administered 2019-09-13 – 2019-09-14 (×3): 100 mg via ORAL
  Filled 2019-09-13 (×3): qty 1

## 2019-09-13 MED ORDER — METHYLPREDNISOLONE SODIUM SUCC 125 MG IJ SOLR
125.0000 mg | Freq: Once | INTRAMUSCULAR | Status: AC
  Administered 2019-09-13: 125 mg via INTRAVENOUS
  Filled 2019-09-13: qty 2

## 2019-09-13 MED ORDER — AZITHROMYCIN 250 MG PO TABS
500.0000 mg | ORAL_TABLET | Freq: Every day | ORAL | Status: AC
  Administered 2019-09-13: 500 mg via ORAL
  Filled 2019-09-13: qty 2

## 2019-09-13 MED ORDER — ATORVASTATIN CALCIUM 40 MG PO TABS
40.0000 mg | ORAL_TABLET | Freq: Every day | ORAL | Status: DC
  Administered 2019-09-13 – 2019-09-16 (×5): 40 mg via ORAL
  Filled 2019-09-13 (×5): qty 1

## 2019-09-13 MED ORDER — MAGNESIUM SULFATE 2 GM/50ML IV SOLN
2.0000 g | Freq: Once | INTRAVENOUS | Status: AC
  Administered 2019-09-13: 2 g via INTRAVENOUS
  Filled 2019-09-13: qty 50

## 2019-09-13 MED ORDER — IPRATROPIUM BROMIDE 0.02 % IN SOLN
0.5000 mg | Freq: Once | RESPIRATORY_TRACT | Status: AC
  Administered 2019-09-13: 0.5 mg via RESPIRATORY_TRACT
  Filled 2019-09-13: qty 2.5

## 2019-09-13 MED ORDER — FUROSEMIDE 20 MG PO TABS: 40.0000 mg | ORAL_TABLET | ORAL | Status: DC

## 2019-09-13 MED ORDER — FUROSEMIDE 20 MG PO TABS: 40.0000 mg | ORAL_TABLET | Freq: Every day | ORAL | Status: DC

## 2019-09-13 MED ORDER — PREDNISONE 20 MG PO TABS
40.0000 mg | ORAL_TABLET | Freq: Every day | ORAL | Status: AC
  Administered 2019-09-14 – 2019-09-17 (×4): 40 mg via ORAL
  Filled 2019-09-13 (×4): qty 2

## 2019-09-13 MED ORDER — ASPIRIN EC 81 MG PO TBEC
81.0000 mg | DELAYED_RELEASE_TABLET | Freq: Every day | ORAL | Status: DC
  Administered 2019-09-14 – 2019-09-17 (×4): 81 mg via ORAL
  Filled 2019-09-13 (×4): qty 1

## 2019-09-13 MED ORDER — MOMETASONE FURO-FORMOTEROL FUM 100-5 MCG/ACT IN AERO
2.0000 | INHALATION_SPRAY | Freq: Two times a day (BID) | RESPIRATORY_TRACT | Status: DC
  Administered 2019-09-14 – 2019-09-15 (×3): 2 via RESPIRATORY_TRACT
  Filled 2019-09-13: qty 8.8

## 2019-09-13 MED ORDER — IPRATROPIUM-ALBUTEROL 0.5-2.5 (3) MG/3ML IN SOLN
3.0000 mL | Freq: Four times a day (QID) | RESPIRATORY_TRACT | Status: DC
  Administered 2019-09-13 – 2019-09-14 (×3): 3 mL via RESPIRATORY_TRACT
  Filled 2019-09-13 (×3): qty 3

## 2019-09-13 MED ORDER — ISOSORBIDE MONONITRATE ER 30 MG PO TB24
15.0000 mg | ORAL_TABLET | Freq: Every day | ORAL | Status: DC
  Administered 2019-09-13 – 2019-09-17 (×5): 15 mg via ORAL
  Filled 2019-09-13 (×5): qty 1

## 2019-09-13 MED ORDER — UMECLIDINIUM BROMIDE 62.5 MCG/INH IN AEPB
1.0000 | INHALATION_SPRAY | Freq: Every day | RESPIRATORY_TRACT | Status: DC
  Administered 2019-09-14 – 2019-09-17 (×4): 1 via RESPIRATORY_TRACT
  Filled 2019-09-13: qty 7

## 2019-09-13 MED ORDER — APIXABAN 5 MG PO TABS
5.0000 mg | ORAL_TABLET | Freq: Two times a day (BID) | ORAL | Status: DC
  Administered 2019-09-13 – 2019-09-17 (×8): 5 mg via ORAL
  Filled 2019-09-13 (×9): qty 1

## 2019-09-13 MED ORDER — ALBUTEROL SULFATE HFA 108 (90 BASE) MCG/ACT IN AERS
2.0000 | INHALATION_SPRAY | Freq: Once | RESPIRATORY_TRACT | Status: AC
  Administered 2019-09-13: 2 via RESPIRATORY_TRACT
  Filled 2019-09-13: qty 6.7

## 2019-09-13 NOTE — H&P (Addendum)
Date: 09/13/2019               Patient Name:  [name] MRN: [id number]  DOB: 01-10-1961 Age / Sex: 59 y.o., male   PCP: Charlott Rakes, MD         Medical Service: Internal Medicine Teaching Service         Attending Physician: Dr. Lucious Groves, DO    First Contact: Dr. Gilford Rile Pager: [phone]  Second Contact: Dr. Sharon Seller  Pager: [phone]       After Hours (After 5p/  First Contact Pager: [phone]  weekends / holidays): Second Contact Pager: [phone]   Chief Complaint: Shortness of Breath and Coughing  History of Present Illness:   [name] is a 59 y/o male with a PMH of Asthma, CAD, MI, diastolic congestive heart failure, DVT, COPD, and hypertension, who presents to the emergency department for shortness of breath and coughing. Patient states that he  started to become short of breath approximately four days ago with uncontrollable coughing fits. He was taking his home inhaler and nebulizer without relief. He endorses that his abdomen and back are hurting from his coughing fits and tachycardia. He endorsed 1 short episode of chest pain 2 days ago, but this episode did not feel like his previous heart attack and went away. He states that the pain was pinpoint on the left side and felt like "pain from a bruise." He attributes this chest pain to his cough. He denies fever, nausea, vomiting, chest pain, abdominal pain, recent illness, or sick contacts.   He has had both of his COVID vaccines two months ago.   He also has been having lower extremity weakness and is going to see someone for this soon. He has been having falls and will have to pull himself up due to his weakness.   He does sometimes have trouble urinating and has trouble getting his urine out and having difficulty initiating micturition.   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  Uses tobacco, smokes 2-3 cigarettes per day for 35 years Uses ETOH drinks  approximately 6-9 beverages per week, denies withdrawal symptoms.   Family History:  HTN: None DM: None Cancer: None Asthma/COPD: Mother diagnosed in her 88s unsure if asthma or COPD  Meds:  Current Meds  Medication Sig  . albuterol (PROAIR HFA) 108 (90 Base) MCG/ACT inhaler Inhale 2 puffs into the lungs every 6 (six) hours as needed for wheezing or shortness of breath.  Marland Kitchen albuterol (PROVENTIL) (2.5 MG/3ML) 0.083% nebulizer solution Take 3 mLs (2.5 mg total) by nebulization every 6 (six) hours as needed for wheezing or shortness of breath.  Marland Kitchen apixaban (ELIQUIS) 5 MG TABS tablet Take 1 tablet (5 mg total) by mouth 2 (two) times daily.  Marland Kitchen aspirin EC 81 MG tablet Take 1 tablet (81 mg total) by mouth daily.  Marland Kitchen atorvastatin (LIPITOR) 40 MG tablet Take 1 tablet (40 mg total) by mouth daily at 6 PM.  . budesonide-formoterol (SYMBICORT) 80-4.5 MCG/ACT inhaler Inhale 2 puffs into the lungs 2 (two) times daily.  . cetirizine (ZYRTEC) 10 MG tablet Take 1 tablet (10 mg total) by mouth daily.  . fluticasone (FLONASE) 50 MCG/ACT nasal spray PLACE 2 SPRAYS INTO BOTH NOSTRILS 2 (TWO) TIMES DAILY. (Patient taking differently: Place 2 sprays into both nostrils 2 (two) times daily as needed for allergies or rhinitis. )  . furosemide (LASIX) 40 MG tablet  Take 1 tablet (40 mg total) by mouth daily. (Patient taking differently: Take 40 mg by mouth daily as needed for fluid or edema. )  . gabapentin (NEURONTIN) 300 MG capsule Take 1 capsule (300 mg total) by mouth 2 (two) times daily. (Patient taking differently: Take 300-600 mg by mouth at bedtime as needed (pain). )  . isosorbide mononitrate (IMDUR) 30 MG 24 hr tablet Take 0.5 tablets (15 mg total) by mouth daily.  . metoprolol succinate (TOPROL XL) 25 MG 24 hr tablet Take 1 tablet (25 mg total) by mouth daily.  . nitroGLYCERIN (NITROSTAT) 0.4 MG SL tablet Place 1 tablet (0.4 mg total) under the tongue every 5 (five) minutes as needed for chest pain.  .  pantoprazole (PROTONIX) 40 MG tablet Take 40 mg by mouth daily.  Marland Kitchen tiZANidine (ZANAFLEX) 4 MG tablet Take 1 tablet (4 mg total) by mouth every 8 (eight) hours as needed for muscle spasms. (Patient taking differently: Take 4 mg by mouth See admin instructions. Take one tablet (4 mg) by mouth every other night)  . umeclidinium bromide (INCRUSE ELLIPTA) 62.5 MCG/INH AEPB Inhale 1 puff into the lungs daily.     Allergies: Allergies as of 09/13/2019  . (No Known Allergies)   Past Medical History:  Diagnosis Date  . Asthma   . CAD (coronary artery disease)    a. 09/2013 NSTEMI/PCI: LM nl, LAD 40-50%, D1 100 (2.25 x 28 Vision BMS), LCX min irregs, RI 60-70, 30, RCA 40-50/50-82ms/p, EF 45-50%.  b. cath 12/2014 -occulded BMS in diag, 50% pro to mid LAD, 60% ramus, 30% RCA   . Chest pain 08/2015  . Chronic diastolic CHF (congestive heart failure) ([place])    a. 09/2014 EF 45-50% by LV gram;  b. 01/2014 Echo: EF 55-60%, Gr 1 DD.  Marland Kitchen Cocaine abuse ([place])   . COPD (chronic obstructive pulmonary disease) ([place])   . DVT (deep venous thrombosis) ([place])    "I had ~ 10 in each leg" (12/20/2016)  . Essential hypertension   . Hepatitis C    "clear free over a year now"  . High cholesterol   . Myocardial infarction ([place]) ~ 2014  . Osteoarthritis    a. hands and toes.  . Pneumonia    "several times" (12/20/2016)  . Pulmonary embolism ([place])    a. 2012 - s/p IVC filter;  b. prev on eliquis - noncompliant.  . Shortness of breath dyspnea   . Sinus headache   . Squamous cell skin cancer 07/13/2016   "foot"  . Tobacco abuse      Review of Systems: A complete ROS was negative except as per HPI.   Physical Exam: Blood pressure (!) 141/94, pulse 90, temperature 99 F (37.2 C), temperature source Oral, resp. rate (!) 25, SpO2 100 %. Physical Exam Vitals and nursing note reviewed.  Constitutional:      Appearance: He is obese. He is not ill-appearing or diaphoretic.     Comments: Patient resting in bed,  multiple coughing fits, throughout the interview, able to answer questions appropriately.   Eyes:     General: No scleral icterus.       Right eye: No discharge.        Left eye: No discharge.     Conjunctiva/sclera: Conjunctivae normal.  Cardiovascular:     Rate and Rhythm: Normal rate and regular rhythm.     Pulses: Normal pulses.     Heart sounds: Normal heart sounds. No murmur heard.  No friction rub.  No gallop.   Pulmonary:     Breath sounds: Wheezing present. No rhonchi or rales.     Comments: Diffuse wheezing auscultated in all lung fields bilaterally.  tachypneic RR 22-25 during interview Chest:     Chest wall: No tenderness.  Abdominal:     General: Bowel sounds are normal.     Tenderness: There is abdominal tenderness. There is no guarding or rebound.     Comments: Generalized tenderness to palpation.   Musculoskeletal:        General: No tenderness.     Right lower leg: Edema present.     Left lower leg: Edema present.     Comments: Trace pitting edema bilaterally in the lower extremities.   Skin:    General: Skin is warm and dry.  Neurological:     Mental Status: He is alert and oriented to person, place, and time.     EKG: personally reviewed my interpretation is sinus tachycardia  CXR: personally reviewed my interpretation is no active cardiopulmonary disease  Assessment & Plan by Problem: Active Problems:   COPD exacerbation [facility])  Mr. Corbet Hanley is a 59 y/o M with a PMH of HTN, MI, COPD, Asthma, DVT on Eliquis, and diastolic congestive heart failure, who presented with [facility] and admitted for treatment of his COPD Exacerbation.   COPD Exacerbation:  Patient presenting to the ED with [facility] and coughing fits resistant to home medications. Patient denied sick contacts, recent infections, or working with harsh chemicals or an identifiable trigger.  He additionally endorses productive cough with yellow tinged sputum. He denies orthopnea and has mild pitting  edema on the lower extremities to suggest this is a heart failure exacerbation. His COVID test is negative and he has no signs of leukocytosis on his CBC with a negative chest xray to suggest typical/atypical pneumonia. During his ED course he was treated with Magnesium sulfate, solumedrol, Atrovent, and ventolin. During the interview he had multiple coughing fits, and was undergoing a nebulizer treatment. Admitting to treat his COPD exacerbation.  - Respiratory Viral Panel  - Azithromycin 500 mg QD for one day followed by Azithromycin 250 mg for 4 days.  - Prednisone 40 mg QD - Magnesium Sulfate - Duoneb 3 mL Q6H - Dulera 2 puff BID  - Incruse Ellipta 1 puff QD  - Tessalon Perles PRN - Monitor vitals - daily weights - Pulse ox - PT/OT Eval and Treat  DVT:  Patient with history of DVT, s/p IVC placement 2212, on eliquis. While patient is on dvt prophylaxis and is presenting with SHOB and cough, he has not had evidence of right heart strain on EKG,lower extremity swelling, and has been compliant with his eliquis regimen makes PE unlikely. He has also had productive cough for yellow mucus, diffuse wheezing on examination, makes COPD exacerbation more likely, even with Well's score of 3, will PE workup at this time.  - Continue eliquis  Diastolic CHF/CAD/HLD/MI:  Patient with history of HFrEF (LVEF 40-45% on 07/08/2019) CAD, and previous MI in 2014 presenting to the ED with [facility] and COPD exacerbation. Patient EKG not showing ST changes or t wave inversions. Due to physical examination showing extensive wheezing, with no imaging or physical exam findings showing overt volume overload, worsening of his heart failure is unlikely. - Metoprolol Succinate 25 mg  QD - Furosemide 20 mg PO every other day - Lipitor 40 mg QD - Daily weights  - ASA 81 mg   Normocytic Anemia:  Patient with  hgb of 11.9 presents to the ED with COPD exacerbation. Does have a slight elevation in his eosinophils likely  secondary to his COPD exacerbation. Will order further studies.  - Iron and TIBC - Ferratin  - Vitamin B12  Diet: Heart Healthy  VTE: Eliquis Code: None   Dispo: Admit patient to Inpatient with expected length of stay greater than 2 midnights.  Signed: Maudie Mercury, MD IMTS, PGY-1 Pager: [phone] 09/13/2019,7:10 PM

## 2019-09-13 NOTE — ED Triage Notes (Signed)
C/o productive cough with yellow phlegm and SOB x 4 days.  Pt used inhaler with no relief and nebulizer with minimal relief.  Audible wheezing on arrival.

## 2019-09-13 NOTE — ED Provider Notes (Signed)
[place] EMERGENCY DEPARTMENT Provider Note   CSN: [id number] Arrival date & time: 09/13/19  1139     History Chief Complaint  Patient presents with  . Asthma  . Cough    [name] is a 59 y.o. male.  59 year old male with history of asthma who smoke cigarettes presents with several days of shortness of breath.  States that has been using his inhaler as well as nebulizer at home without relief.  No fever or chills.  Cough has been nonproductive.  Denies any anginal or CHF symptoms.  History of hospitalization in the past for asthma but denies any history of intubation.  Last hospitalized 2 years ago        Past Medical History:  Diagnosis Date  . Asthma   . CAD (coronary artery disease)    a. 09/2013 NSTEMI/PCI: LM nl, LAD 40-50%, D1 100 (2.25 x 28 Vision BMS), LCX min irregs, RI 60-70, 30, RCA 40-50/50-46ms/p, EF 45-50%.  b. cath 12/2014 -occulded BMS in diag, 50% pro to mid LAD, 60% ramus, 30% RCA   . Chest pain 08/2015  . Chronic diastolic CHF (congestive heart failure) ([place])    a. 09/2014 EF 45-50% by LV gram;  b. 01/2014 Echo: EF 55-60%, Gr 1 DD.  Marland Kitchen Cocaine abuse ([place])   . COPD (chronic obstructive pulmonary disease) ([place])   . DVT (deep venous thrombosis) ([place])    "I had ~ 10 in each leg" (12/20/2016)  . Essential hypertension   . Hepatitis C    "clear free over a year now"  . High cholesterol   . Myocardial infarction ([place]) ~ 2014  . Osteoarthritis    a. hands and toes.  . Pneumonia    "several times" (12/20/2016)  . Pulmonary embolism ([place])    a. 2012 - s/p IVC filter;  b. prev on eliquis - noncompliant.  . Shortness of breath dyspnea   . Sinus headache   . Squamous cell skin cancer 07/13/2016   "foot"  . Tobacco abuse     Patient Active Problem List   Diagnosis Date Noted  . GERD (gastroesophageal reflux disease) 04/24/2017  . COPD exacerbation ([place]) 04/05/2017  . Chronic sinusitis 02/02/2017  . COPD with acute exacerbation  ([place]) 12/20/2016  . History of squamous cell carcinoma 08/03/2016  . Psoriasis 08/03/2016  . Squamous cell skin cancer 07/13/2016  . Chronic anticoagulation 02/01/2016  . Malnutrition of moderate degree 06/05/2015  . Liver fibrosis 04/08/2015  . COPD (chronic obstructive pulmonary disease) ([place]) 01/01/2015  . Essential hypertension 01/01/2015  . Unstable angina ([place]) 01/01/2015  . History of coronary artery disease   . Cocaine abuse ([place]) 12/01/2014  . Pleuritic chest pain 12/01/2014  . Chronic diastolic heart failure ([place]) 12/01/2014  . QT prolongation 12/01/2014  . Chronic hepatitis C without hepatic coma ([place]) 06/18/2014  . HTN (hypertension) 04/27/2014  . Coronary artery disease involving native coronary artery of native heart without angina pectoris   . CAD (coronary artery disease) 10/21/2013  . NSTEMI (non-ST elevated myocardial infarction) ([place]) 10/21/2013  . Anemia 06/20/2013  . Regular alcohol consumption 06/18/2013  . History of DVT (deep vein thrombosis) 02/21/2013  . Thrombocytopenia ([place]) 08/09/2012  . Tobacco abuse 08/09/2012  . History of noncompliance with medical treatment 03/24/2012  . Atopic dermatitis 03/24/2012  . History of pulmonary embolism 02/10/2012    Past Surgical History:  Procedure Laterality Date  . CARDIAC CATHETERIZATION N/A 01/04/2015   Procedure: Left Heart Cath and Coronary Angiography;  Surgeon: Burnell Blanks, MD;  Location: [place] [facility] CV LAB;  Service: Cardiovascular;  Laterality: N/A;  . CORONARY ANGIOPLASTY WITH STENT PLACEMENT  10/21/13   BMS to D1  . CYST EXCISION N/A 05/10/2016   Procedure: EXCISION OF SEBACEOUS CYST UPPER BACK;  Surgeon: Donnie Mesa, MD;  Location: [facility];  Service: General;  Laterality: N/A;  . FOOT SURGERY Right ~ 06/2016   "said there was skin cancer on it"  . LEFT HEART CATHETERIZATION WITH CORONARY ANGIOGRAM N/A 10/21/2013   Procedure: LEFT HEART CATHETERIZATION WITH CORONARY ANGIOGRAM;  Surgeon: Leonie Man, MD;  Location: [facility] CATH LAB;  Service: Cardiovascular;  Laterality: N/A;  . VENA CAVA FILTER PLACEMENT  ~ 2007-~ 2017   "1 in my neck; 2 in my wrist"       Family History  Problem Relation Age of Onset  . Asthma Mother   . Allergies Mother   . Allergies Sister   . Allergies Brother   . Deep vein thrombosis Brother        two brothers with recurrent DVT  . Heart attack Father        a.60s b. deceased in his 26s  . Coronary artery disease Father   . Colon cancer Neg Hx   . Liver cancer Neg Hx   . Esophageal cancer Neg Hx   . Colon polyps Neg Hx   . Rectal cancer Neg Hx   . Pancreatic cancer Neg Hx   . Stomach cancer Neg Hx     Social History   Tobacco Use  . Smoking status: Current Every Day Smoker    Packs/day: 0.25    Years: 39.00    Pack years: 9.75    Types: Cigarettes, Cigars  . Smokeless tobacco: Never Used  Vaping Use  . Vaping Use: Never used  Substance Use Topics  . Alcohol use: Yes    Alcohol/week: 6.0 standard drinks    Types: 2 Cans of beer, 4 Shots of liquor per week  . Drug use: Not Currently    Types: Cocaine    Comment: 12/20/2016 "might have used some the other day; I'm not sure"    Home Medications Prior to Admission medications   Medication Sig Start Date End Date Taking? Authorizing Provider  albuterol (PROAIR HFA) 108 (90 Base) MCG/ACT inhaler Inhale 2 puffs into the lungs every 6 (six) hours as needed for wheezing or shortness of breath. 06/25/19   Argentina Donovan, PA-C  albuterol (PROVENTIL) (2.5 MG/3ML) 0.083% nebulizer solution Take 3 mLs (2.5 mg total) by nebulization every 6 (six) hours as needed for wheezing or shortness of breath. 04/21/19   Charlott Rakes, MD  apixaban (ELIQUIS) 5 MG TABS tablet Take 1 tablet (5 mg total) by mouth 2 (two) times daily. 04/21/19   Charlott Rakes, MD  aspirin EC 81 MG tablet Take 1 tablet (81 mg total) by mouth daily. 04/29/19   Elouise Munroe, MD  atorvastatin (LIPITOR) 40 MG tablet Take 1  tablet (40 mg total) by mouth daily at 6 PM. 04/21/19   Charlott Rakes, MD  budesonide-formoterol (SYMBICORT) 80-4.5 MCG/ACT inhaler Inhale 2 puffs into the lungs 2 (two) times daily. 04/21/19   Charlott Rakes, MD  cetirizine (ZYRTEC) 10 MG tablet Take 1 tablet (10 mg total) by mouth daily. 01/14/19   Charlott Rakes, MD  fluticasone (FLONASE) 50 MCG/ACT nasal spray PLACE 2 SPRAYS INTO BOTH NOSTRILS 2 (TWO) TIMES DAILY. 06/05/19   Charlott Rakes, MD  furosemide (LASIX) 40  MG tablet Take 1 tablet (40 mg total) by mouth daily. 04/21/19   Charlott Rakes, MD  gabapentin (NEURONTIN) 300 MG capsule Take 1 capsule (300 mg total) by mouth 2 (two) times daily. 04/21/19   Charlott Rakes, MD  isosorbide mononitrate (IMDUR) 30 MG 24 hr tablet Take 0.5 tablets (15 mg total) by mouth daily. 08/05/19 09/04/19  Deberah Pelton, NP  metoprolol succinate (TOPROL XL) 25 MG 24 hr tablet Take 1 tablet (25 mg total) by mouth daily. 07/18/19   Elouise Munroe, MD  nitroGLYCERIN (NITROSTAT) 0.4 MG SL tablet Place 1 tablet (0.4 mg total) under the tongue every 5 (five) minutes as needed for chest pain. 08/05/19 11/03/19  Deberah Pelton, NP  tiZANidine (ZANAFLEX) 4 MG tablet Take 1 tablet (4 mg total) by mouth every 8 (eight) hours as needed for muscle spasms. 07/29/19   Charlott Rakes, MD  traMADol (ULTRAM) 50 MG tablet Take 1 tablet (50 mg total) by mouth every 12 (twelve) hours as needed. Prn pain 06/25/19   Argentina Donovan, PA-C  umeclidinium bromide (INCRUSE ELLIPTA) 62.5 MCG/INH AEPB Inhale 1 puff into the lungs daily. 04/21/19   Charlott Rakes, MD    Allergies    No known allergies  Review of Systems   Review of Systems  All other systems reviewed and are negative.   Physical Exam Updated Vital Signs BP (!) 129/94 (BP Location: Right Arm)   Pulse (!) 103   Temp 99 F (37.2 C) (Oral)   Resp (!) 22 Comment: audible wheezing  SpO2 99%   Physical Exam Vitals and nursing note reviewed.  Constitutional:       General: He is not in acute distress.    Appearance: Normal appearance. He is well-developed. He is not toxic-appearing.  HENT:     Head: Normocephalic and atraumatic.  Eyes:     General: Lids are normal.     Conjunctiva/sclera: Conjunctivae normal.     Pupils: Pupils are equal, round, and reactive to light.  Neck:     Thyroid: No thyroid mass.     Trachea: No tracheal deviation.  Cardiovascular:     Rate and Rhythm: Normal rate and regular rhythm.     Heart sounds: Normal heart sounds. No murmur heard.  No gallop.   Pulmonary:     Effort: Pulmonary effort is normal. No respiratory distress.     Breath sounds: No stridor. Decreased breath sounds and wheezing present. No rhonchi or rales.  Abdominal:     General: Bowel sounds are normal. There is no distension.     Palpations: Abdomen is soft.     Tenderness: There is no abdominal tenderness. There is no rebound.  Musculoskeletal:        General: No tenderness. Normal range of motion.     Cervical back: Normal range of motion and neck supple.  Skin:    General: Skin is warm and dry.     Findings: No abrasion or rash.  Neurological:     Mental Status: He is alert and oriented to person, place, and time.     GCS: GCS eye subscore is 4. GCS verbal subscore is 5. GCS motor subscore is 6.     Cranial Nerves: No cranial nerve deficit.     Sensory: No sensory deficit.  Psychiatric:        Speech: Speech normal.        Behavior: Behavior normal.     ED Results / Procedures / Treatments  Labs (all labs ordered are listed, but only abnormal results are displayed) Labs Reviewed  SARS CORONAVIRUS 2 BY RT PCR [facility] ORDER, [place] LAB)  CBC WITH DIFFERENTIAL/PLATELET  BASIC METABOLIC PANEL    EKG EKG Interpretation  Date/Time:  Saturday September 13 2019 11:52:43 EDT Ventricular Rate:  102 PR Interval:  184 QRS Duration: 82 QT Interval:  352 QTC Calculation: 458 R Axis:   50 Text  Interpretation: Sinus tachycardia Otherwise normal ECG No significant change since last tracing Confirmed by Lacretia Leigh ([id number]) on 09/13/2019 12:06:21 PM   Radiology No results found.  Procedures Procedures (including critical care time)  Medications Ordered in ED Medications  0.9 %  sodium chloride infusion (has no administration in time range)  methylPREDNISolone sodium succinate (SOLU-MEDROL) 125 mg/2 mL injection 125 mg (has no administration in time range)  albuterol (VENTOLIN HFA) 108 (90 Base) MCG/ACT inhaler 2 puff (has no administration in time range)    ED Course  I have reviewed the triage vital signs and the nursing notes.  Pertinent labs & imaging results that were available during my care of the patient were reviewed by me and considered in my medical decision making (see chart for details).    MDM Rules/Calculators/A&P                         Chest x-ray without acute findings.  Patient given Solu-Medrol along with 10 mg of albuterol here and wheezing continues.  Covid test is negative.  Will give patient magnesium.  Patient will require admission    final Clinical Impression(s) / ED Diagnoses Final diagnoses:  None   CRITICAL CARE Performed by: Leota Jacobsen Total critical care time: 50 minutes Critical care time was exclusive of separately billable procedures and treating other patients. Critical care was necessary to treat or prevent imminent or life-threatening deterioration. Critical care was time spent personally by me on the following activities: development of treatment plan with patient and/or surrogate as well as nursing, discussions with consultants, evaluation of patient's response to treatment, examination of patient, obtaining history from patient or surrogate, ordering and performing treatments and interventions, ordering and review of laboratory studies, ordering and review of radiographic studies, pulse oximetry and re-evaluation of patient's  condition.  Rx / DC Orders ED Discharge Orders    None       Lacretia Leigh, MD 09/13/19 1503

## 2019-09-14 DIAGNOSIS — I5030 Unspecified diastolic (congestive) heart failure: Secondary | ICD-10-CM

## 2019-09-14 DIAGNOSIS — Z86718 Personal history of other venous thrombosis and embolism: Secondary | ICD-10-CM

## 2019-09-14 DIAGNOSIS — E785 Hyperlipidemia, unspecified: Secondary | ICD-10-CM

## 2019-09-14 DIAGNOSIS — I2510 Atherosclerotic heart disease of native coronary artery without angina pectoris: Secondary | ICD-10-CM

## 2019-09-14 DIAGNOSIS — Z7901 Long term (current) use of anticoagulants: Secondary | ICD-10-CM

## 2019-09-14 DIAGNOSIS — I110 Hypertensive heart disease with heart failure: Secondary | ICD-10-CM

## 2019-09-14 DIAGNOSIS — J441 Chronic obstructive pulmonary disease with (acute) exacerbation: Principal | ICD-10-CM

## 2019-09-14 DIAGNOSIS — D649 Anemia, unspecified: Secondary | ICD-10-CM

## 2019-09-14 DIAGNOSIS — I252 Old myocardial infarction: Secondary | ICD-10-CM

## 2019-09-14 LAB — CBC WITH DIFFERENTIAL/PLATELET
Abs Immature Granulocytes: 0.02 10*3/uL (ref 0.00–0.07)
Basophils Absolute: 0 10*3/uL (ref 0.0–0.1)
Basophils Relative: 0 %
Eosinophils Absolute: 0 10*3/uL (ref 0.0–0.5)
Eosinophils Relative: 0 %
HCT: 32.8 % — ABNORMAL LOW (ref 39.0–52.0)
Hemoglobin: 10.6 g/dL — ABNORMAL LOW (ref 13.0–17.0)
Immature Granulocytes: 0 %
Lymphocytes Relative: 19 %
Lymphs Abs: 1.1 10*3/uL (ref 0.7–4.0)
MCH: 30.5 pg (ref 26.0–34.0)
MCHC: 32.3 g/dL (ref 30.0–36.0)
MCV: 94.3 fL (ref 80.0–100.0)
Monocytes Absolute: 0.3 10*3/uL (ref 0.1–1.0)
Monocytes Relative: 6 %
Neutro Abs: 4 10*3/uL (ref 1.7–7.7)
Neutrophils Relative %: 75 %
Platelets: 195 10*3/uL (ref 150–400)
RBC: 3.48 MIL/uL — ABNORMAL LOW (ref 4.22–5.81)
RDW: 14.4 % (ref 11.5–15.5)
WBC: 5.4 10*3/uL (ref 4.0–10.5)
nRBC: 0 % (ref 0.0–0.2)

## 2019-09-14 LAB — IRON AND TIBC
Iron: 83 ug/dL (ref 45–182)
Saturation Ratios: 20 % (ref 17.9–39.5)
TIBC: 409 ug/dL (ref 250–450)
UIBC: 326 ug/dL

## 2019-09-14 LAB — BASIC METABOLIC PANEL
Anion gap: 8 (ref 5–15)
BUN: 19 mg/dL (ref 6–20)
CO2: 22 mmol/L (ref 22–32)
Calcium: 9 mg/dL (ref 8.9–10.3)
Chloride: 107 mmol/L (ref 98–111)
Creatinine, Ser: 0.81 mg/dL (ref 0.61–1.24)
GFR calc Af Amer: >60 mL/min (ref >60)
GFR calc non Af Amer: >60 mL/min (ref >60)
Glucose, Bld: 146 mg/dL — ABNORMAL HIGH (ref 70–99)
Potassium: 4.7 mmol/L (ref 3.5–5.1)
Sodium: 137 mmol/L (ref 135–145)

## 2019-09-14 LAB — MAGNESIUM: Magnesium: 2.3 mg/dL (ref 1.7–2.4)

## 2019-09-14 LAB — VITAMIN B12: Vitamin B-12: 259 pg/mL (ref 180–914)

## 2019-09-14 LAB — FERRITIN: Ferritin: 42 ng/mL (ref 24–336)

## 2019-09-14 LAB — HIV ANTIBODY (ROUTINE TESTING W REFLEX): HIV Screen 4th Generation wRfx: NONREACTIVE

## 2019-09-14 MED ORDER — KETOROLAC TROMETHAMINE 15 MG/ML IJ SOLN: 15.0000 mg | Freq: Four times a day (QID) | INTRAMUSCULAR | Status: DC

## 2019-09-14 MED ORDER — GUAIFENESIN-CODEINE 100-10 MG/5ML PO SOLN
10.0000 mL | Freq: Four times a day (QID) | ORAL | Status: DC | PRN
  Administered 2019-09-14: 10 mL via ORAL
  Filled 2019-09-14: qty 10

## 2019-09-14 MED ORDER — IPRATROPIUM-ALBUTEROL 0.5-2.5 (3) MG/3ML IN SOLN
3.0000 mL | Freq: Three times a day (TID) | RESPIRATORY_TRACT | Status: AC
  Administered 2019-09-14 – 2019-09-16 (×6): 3 mL via RESPIRATORY_TRACT
  Filled 2019-09-14 (×6): qty 3

## 2019-09-14 MED ORDER — BENZONATATE 100 MG PO CAPS
200.0000 mg | ORAL_CAPSULE | Freq: Three times a day (TID) | ORAL | Status: DC | PRN
  Administered 2019-09-14 – 2019-09-16 (×2): 200 mg via ORAL
  Filled 2019-09-14 (×2): qty 2

## 2019-09-14 MED ORDER — GUAIFENESIN-CODEINE 100-10 MG/5ML PO SOLN
10.0000 mL | ORAL | Status: DC | PRN
  Filled 2019-09-14: qty 10

## 2019-09-14 MED ORDER — GUAIFENESIN-CODEINE 100-10 MG/5ML PO SOLN
5.0000 mL | Freq: Four times a day (QID) | ORAL | Status: DC | PRN
  Administered 2019-09-14: 5 mL via ORAL
  Filled 2019-09-14: qty 5

## 2019-09-14 MED ORDER — KETOROLAC TROMETHAMINE 15 MG/ML IJ SOLN
15.0000 mg | Freq: Four times a day (QID) | INTRAMUSCULAR | Status: AC | PRN
  Administered 2019-09-14 – 2019-09-15 (×4): 15 mg via INTRAVENOUS
  Filled 2019-09-14 (×4): qty 1

## 2019-09-14 NOTE — Progress Notes (Signed)
Subjective:  ON Events: None  Patient evaluated at bedside. He is feeling a little better, but is having some pain from coughing in his side still. His breathing however, is much improved. He is asking for a shot of morphine this morphine for his side pain. We discussed starting codeine today to hopefully alleviate his cough and his side pain. Patient is agreeable to the plan.   Objective:  Vital signs in last 24 hours: Vitals:   09/13/19 1941 09/13/19 2134 09/14/19 0500 09/14/19 0539  BP:  133/87  130/82  Pulse:  87    Resp:  20  (!) 22  Temp:  98.2 F (36.8 C)    TempSrc:  Oral    SpO2: 98% 98%  98%  Weight:   95.1 kg    Physical Exam Constitutional:      Appearance: He is normal weight. He is not toxic-appearing or diaphoretic.     Comments: Patient sitting in bed, multiple coughing fits throughout the interview. Answers questions appropriately.   Cardiovascular:     Rate and Rhythm: Normal rate and regular rhythm.     Pulses: Normal pulses.     Heart sounds: Normal heart sounds. No murmur heard.  No friction rub. No gallop.   Pulmonary:     Breath sounds: Wheezing present.     Comments: Patient on room air. Wheezes auscultated in the lung fields bilaterally, improved from admission examination.  Abdominal:     General: Abdomen is flat. Bowel sounds are normal.     Palpations: Abdomen is soft.     Tenderness: There is no abdominal tenderness. There is no guarding.  Neurological:     Mental Status: He is alert.      LABS CBC Latest Ref Rng & Units 09/13/2019 06/09/2019 06/30/2017  WBC 4.0 - 10.5 K/uL 5.8 5.2 7.1  Hemoglobin 13.0 - 17.0 g/dL 11.9(L) 13.0 15.1  Hematocrit 39 - 52 % 36.5(L) 38.4 46.1  Platelets 150 - 400 K/uL 193 212 100(L)    BMP Latest Ref Rng & Units 09/13/2019 01/21/2019 03/06/2018  Glucose 70 - 99 mg/dL 98 92 CANCELED  BUN 6 - 20 mg/dL 16 7 16   Creatinine 0.61 - 1.24 mg/dL 0.89 0.70(L) 0.80  BUN/Creat Ratio 9 - 20 - 10 20  Sodium 135 - 145  mmol/L 137 142 142  Potassium 3.5 - 5.1 mmol/L 4.8 3.8 CANCELED  Chloride 98 - 111 mmol/L 109 105 105  CO2 22 - 32 mmol/L 21(L) 24 17(L)  Calcium 8.9 - 10.3 mg/dL 8.9 9.3 9.8     Assessment/Plan:  Active Problems:   COPD exacerbation [facility])  [name] is a 59 y/o M with a PMH of HTN, MI, COPD, Asthma, DVT on Eliquis, and diastolic congestive heart failure, who presented with [facility] and admitted for treatment of his COPD Exacerbation.   COPD Exacerbation:  Patient weaned from 4L to room air overnight. Breathing is better, but cough still persists, will start codeine today for both cough suppression and flank pain.  - Ordered Guaifenesin-codeine 100-10 mg/74mL - Respiratory Viral Panel- Negative  - Azithromycin 250 mg.  - Prednisone 40 mg QD - Duoneb 3 mL Q6H - Dulera 2 puff BID  - Incruse Ellipta 1 puff QD  - Tessalon Perles PRN - Monitor vitals - daily weights - Pulse ox - PT/OT Eval and Treat  History of DVTs:   - Continue eliquis  Diastolic CHF/CAD/HLD/MI:  - Metoprolol Succinate 25 mg QD - Furosemide 20 mg  PO every other day - Lipitor 40 mg QD - Daily weights  - ASA 81 mg   Normocytic Anemia:  Patient with hgb of 11.9 presents to the ED with COPD exacerbation. Does have a slight elevation in his eosinophils likely secondary to his COPD exacerbation. Will order further studies.  - Iron: 83 - TIBC: 409 - Ferratin: 42 - Vitamin B12: 259  Prior to Admission Living Arrangement: Home Anticipated Discharge Location: Home Barriers to Discharge: Continued Medical Workup Dispo: Anticipated discharge in approximately 2-3 day(s).   Maudie Mercury, MD 09/14/2019, 5:56 AM Pager: [phone] After 5pm on weekdays and 1pm on weekends: On Call pager [phone]

## 2019-09-14 NOTE — Evaluation (Addendum)
Occupational Therapy Evaluation Patient Details Name: [name] MRN: [id number] DOB: Jan 27, 1961 Today's Date: 09/14/2019    History of Present Illness 59 y.o. male admitted 09/13/19 for SOB and coughing due to COPD exacerbation. Chest x-ray 09/13/19 Emphysematous changes in the right mid and upper lung zones. PMH cancer, PE, OA, DVT, CHF, CAD, MI.   Clinical Impression   PTA pt living alone, independent for BADL/IADL. At time of eval, pt is able to complete mobility at independent level. He demonstrates ability to physically complete BADL at mod I level but remains limited by activity tolerance and cardiopulmonary status. Educated pt on ECS strategies such as pursed lip breathing, pacing, prioritizing, Issued pt incentive spirometer and educated on usage. Attempted to educate pt on managing pulse ox reading at home for safety, he denied and states he "listens to what his body tells him". VSS on RA. No OT follow up is recommended. Pt is clear to d/c from inpatient OT, OT will sign off. Please reconsult if changes arise, thank you for this consult.     Follow Up Recommendations  No OT follow up    Equipment Recommendations  Tub/shower seat    Recommendations for Other Services       Precautions / Restrictions Precautions Precautions: None Restrictions Weight Bearing Restrictions: No      Mobility Bed Mobility Overal bed mobility: Independent                Transfers Overall transfer level: Independent                    Balance Overall balance assessment: No apparent balance deficits (not formally assessed)                                         ADL either performed or assessed with clinical judgement   ADL Overall ADL's : Modified independent                                       General ADL Comments: Pt demonstrates ability to complete BADL at mod I level. Session focused on education and implementation of ECS  strategies with BADL/IADL routine     Vision Patient Visual Report: No change from baseline       Perception     Praxis      Pertinent Vitals/Pain Pain Assessment: Faces Faces Pain Scale: Hurts a little bit Pain Location: L side with coughing Pain Descriptors / Indicators: Grimacing;Discomfort;Guarding;Sore Pain Intervention(s): Monitored during session;Repositioned     Hand Dominance Right   Extremity/Trunk Assessment Upper Extremity Assessment Upper Extremity Assessment: Overall WFL for tasks assessed   Lower Extremity Assessment Lower Extremity Assessment: Overall WFL for tasks assessed   Cervical / Trunk Assessment Cervical / Trunk Assessment: Normal   Communication Communication Communication: No difficulties   Cognition Arousal/Alertness: Awake/alert Behavior During Therapy: WFL for tasks assessed/performed Overall Cognitive Status: Within Functional Limits for tasks assessed                                 General Comments: somewhat resistive to education and denying needs for COPD management strategies   General Comments  SpO2 stable on RA    Exercises Other Exercises Other Exercises: Incentive spirometer-  1579mL x3. Encouraged to make guage on L side hover in middle to further strengthen lungs   Shoulder Instructions      Home Living Family/patient expects to be discharged to:: Private residence Living Arrangements: Alone Available Help at Discharge: Family;Available PRN/intermittently Type of Home: Apartment Home Access: Elevator     Home Layout: One level     Bathroom Shower/Tub: Teacher, early years/pre: Handicapped height Bathroom Accessibility: Yes How Accessible: Accessible via walker Home Equipment: None          Prior Functioning/Environment Level of Independence: Independent                 OT Problem List: Decreased knowledge of use of DME or AE;Decreased knowledge of precautions;Decreased  activity tolerance;Cardiopulmonary status limiting activity      OT Treatment/Interventions:      OT Goals(Current goals can be found in the care plan section) Acute Rehab OT Goals Patient Stated Goal: reduce coughing OT Goal Formulation: All assessment and education complete, DC therapy  OT Frequency:     Barriers to D/C:            Co-evaluation              AM-PAC OT "6 Clicks" Daily Activity     Outcome Measure Help from another person eating meals?: None Help from another person taking care of personal grooming?: None Help from another person toileting, which includes using toliet, bedpan, or urinal?: None Help from another person bathing (including washing, rinsing, drying)?: None Help from another person to put on and taking off regular upper body clothing?: None Help from another person to put on and taking off regular lower body clothing?: None 6 Click Score: 24   End of Session Nurse Communication: Mobility status;Other (comment) (requestig cough medicine)  Activity Tolerance: Patient tolerated treatment well Patient left: in bed;with call bell/phone within reach  OT Visit Diagnosis: Other abnormalities of gait and mobility (R26.89)                Time: 3335-4562 OT Time Calculation (min): 20 min Charges:  OT General Charges $OT Visit: 1 Visit OT Evaluation $OT Eval Low Complexity: 1 Low  Zenovia Jarred, MSOT, OTR/L Acute Rehabilitation Services [facility] Office Number: [phone] Pager: [phone]  Zenovia Jarred 09/14/2019, 4:23 PM

## 2019-09-14 NOTE — Evaluation (Signed)
Physical Therapy Evaluation Patient Details Name: [name] MRN: [id number] DOB: [birth year] Today's Date: 09/14/2019   History of Present Illness  59 y.o. male admitted 09/13/19 for SOB and coughing due to COPD exacerbation. Chest x-ray 09/13/19 Emphysematous changes in the right mid and upper lung zones. PMH cancer, PE, OA, DVT, CHF, CAD, MI.  Clinical Impression  Pt presents with no overall decrease in functional mobility secondary to above. PTA, pt lives at home in [place] apartment with Media planner, uses bus for negotiating community and independent. Educ on energy conservation and benefit of using rollator for longer distances, pt declined suggestion for use of rollator. Today, pt able to complete bed mobility, transfers and amb independently with self selected rest breaks, VSS. Pt independent from mobility stand point and no longer requires PT acutely.   Pt spo2 >94% RA    Follow Up Recommendations No PT follow up    Equipment Recommendations  None recommended by PT    Recommendations for Other Services       Precautions / Restrictions Precautions Precautions: None      Mobility  Bed Mobility Overal bed mobility: Independent                Transfers Overall transfer level: Independent                  Ambulation/Gait Ambulation/Gait assistance: Independent Gait Distance (Feet): 450 Feet Assistive device: None Gait Pattern/deviations: WFL(Within Functional Limits) Gait velocity: WNL, required cues to slow down initally for safety   General Gait Details: Pt consistent coughing with mobility, pt spo2 >94% RA during amb. Pt required x2 standing rest breaks during amb.  Stairs            Wheelchair Mobility    Modified Rankin (Stroke Patients Only)       Balance Overall balance assessment: No apparent balance deficits (not formally assessed)                                           Pertinent Vitals/Pain Pain  Assessment: Faces Faces Pain Scale: Hurts a little bit Pain Location: chest with coughing Pain Descriptors / Indicators: Grimacing;Discomfort;Guarding;Sore Pain Intervention(s): Limited activity within patient's tolerance;Monitored during session;Patient requesting pain meds-RN notified    Home Living Family/patient expects to be discharged to:: Private residence Living Arrangements: Alone Available Help at Discharge: Family;Available PRN/intermittently Type of Home: House Home Access: Elevator     Home Layout: One level Home Equipment: None      Prior Function Level of Independence: Independent               Hand Dominance   Dominant Hand: Right    Extremity/Trunk Assessment   Upper Extremity Assessment Upper Extremity Assessment: Defer to OT evaluation    Lower Extremity Assessment Lower Extremity Assessment: Overall WFL for tasks assessed    Cervical / Trunk Assessment Cervical / Trunk Assessment: Normal  Communication   Communication: No difficulties  Cognition Arousal/Alertness: Awake/alert Behavior During Therapy: WFL for tasks assessed/performed Overall Cognitive Status: Within Functional Limits for tasks assessed                                        General Comments General comments (skin integrity, edema, etc.): Pt consistently coughing during session, pt spo2 >94%  RA during session. Pt skin integ appeared WNL. Attempted to educate on energy conservation, pt not receptive to use of rollator stating he "takes the bus and does not need that".    Exercises     Assessment/Plan    PT Assessment Patent does not need any further PT services  PT Problem List         PT Treatment Interventions      PT Goals (Current goals can be found in the Care Plan section)  Acute Rehab PT Goals PT Goal Formulation: All assessment and education complete, DC therapy    Frequency     Barriers to discharge        Co-evaluation                AM-PAC PT "6 Clicks" Mobility  Outcome Measure Help needed turning from your back to your side while in a flat bed without using bedrails?: None Help needed moving from lying on your back to sitting on the side of a flat bed without using bedrails?: None Help needed moving to and from a bed to a chair (including a wheelchair)?: None Help needed standing up from a chair using your arms (e.g., wheelchair or bedside chair)?: None Help needed to walk in hospital room?: None Help needed climbing 3-5 steps with a railing? : None 6 Click Score: 24    End of Session Equipment Utilized During Treatment: Gait belt Activity Tolerance: Patient tolerated treatment well Patient left: in bed;with call bell/phone within reach Nurse Communication: Mobility status;Other (comment) (request for pain meds)      Time: 7494-4967 PT Time Calculation (min) (ACUTE ONLY): 16 min   Charges:   PT Evaluation $PT Eval Moderate Complexity: 1 Mod          Madelyn Tlatelpa SPT 09/14/2019   Rolland Porter 09/14/2019, 2:56 PM

## 2019-09-15 LAB — BASIC METABOLIC PANEL
Anion gap: 8 (ref 5–15)
BUN: 21 mg/dL — ABNORMAL HIGH (ref 6–20)
CO2: 22 mmol/L (ref 22–32)
Calcium: 8.5 mg/dL — ABNORMAL LOW (ref 8.9–10.3)
Chloride: 107 mmol/L (ref 98–111)
Creatinine, Ser: 1 mg/dL (ref 0.61–1.24)
GFR calc Af Amer: >60 mL/min (ref >60)
GFR calc non Af Amer: >60 mL/min (ref >60)
Glucose, Bld: 151 mg/dL — ABNORMAL HIGH (ref 70–99)
Potassium: 4.2 mmol/L (ref 3.5–5.1)
Sodium: 137 mmol/L (ref 135–145)

## 2019-09-15 LAB — CBC
HCT: 30.9 % — ABNORMAL LOW (ref 39.0–52.0)
Hemoglobin: 9.7 g/dL — ABNORMAL LOW (ref 13.0–17.0)
MCH: 29.8 pg (ref 26.0–34.0)
MCHC: 31.4 g/dL (ref 30.0–36.0)
MCV: 94.8 fL (ref 80.0–100.0)
Platelets: 146 10*3/uL — ABNORMAL LOW (ref 150–400)
RBC: 3.26 MIL/uL — ABNORMAL LOW (ref 4.22–5.81)
RDW: 14.5 % (ref 11.5–15.5)
WBC: 5.4 10*3/uL (ref 4.0–10.5)
nRBC: 0 % (ref 0.0–0.2)

## 2019-09-15 MED ORDER — MOMETASONE FURO-FORMOTEROL FUM 200-5 MCG/ACT IN AERO
2.0000 | INHALATION_SPRAY | Freq: Two times a day (BID) | RESPIRATORY_TRACT | Status: DC
  Administered 2019-09-15 – 2019-09-17 (×4): 2 via RESPIRATORY_TRACT
  Filled 2019-09-15: qty 8.8

## 2019-09-15 NOTE — Progress Notes (Signed)
Internal Medicine Attending:   I saw and examined the patient. I reviewed the resident's note and I agree with the resident's findings and plan as documented in the resident's note.  Patient states that he is breathing is mildly improved but still has some persistent shortness of breath and coughing.  On lung exam, patient had bilateral diffuse expiratory wheezes.  Patient was initially admitted to the hospital with an acute COPD exacerbation.  Patient noted to have persistent cough and wheezing on exam today.  We will continue with prednisone 40 mg daily to complete a 5-day course as well as azithromycin to complete a 5-day course for his COPD exacerbation.  We will also continue patient on home inhalers (Dulera and Ellipta).  Patient respiratory viral panel was negative.  We will continue with guaifenesin/codeine for cough suppression.  Patient does complain of some right-sided flank pain with some tenderness to palpation.  We will continue with ketorolac as needed for this.  Of note, patient hemoglobin is down to 9.7 today from 11.9 on admission.  Patient denies any melena, hematemesis or hemoptysis.  If patient's hemoglobin continues to deteriorate would consider checking CT abdomen pelvis to rule out RP bleed given that he is on therapeutic anticoagulation for history of DVT.  We will continue to monitor closely.  No further work-up at this time.

## 2019-09-15 NOTE — Progress Notes (Addendum)
Subjective:  O/N Events: None  Patient states that he is doing well today, but continues to have significant coughing. He states that the medication have improved it but not too much. He also have chronic side pain, that seems to be worse now due to increased cough, but is alleviated with Ketoralac. He also admits to missing some doses of his medications, including his elquis. He denies blood in his BM or any overt signs of bleeding.Otherwise, he feels like he is moving in the right direction.  Objective:  Vital signs in last 24 hours: Vitals:   09/15/19 0300 09/15/19 0736 09/15/19 0755 09/15/19 0757  BP:  129/77    Pulse:  68    Resp:  18    Temp:  97.7 F (36.5 C)    TempSrc:      SpO2:  97% 98% 98%  Weight: 96.3 kg      Physical Exam Vitals and nursing note reviewed.  Cardiovascular:     Rate and Rhythm: Normal rate and regular rhythm.     Pulses: Normal pulses.     Heart sounds: Normal heart sounds. No murmur heard.  No friction rub. No gallop.   Pulmonary:     Effort: Pulmonary effort is normal. No respiratory distress.     Comments: Diffuse wheezing auscultated in both lung fields bilaterally. Coughing fits throughout physical examination.  Chest:     Chest wall: No tenderness.  Neurological:     Mental Status: He is alert.        LABS CBC Latest Ref Rng & Units 09/15/2019 09/14/2019 09/13/2019  WBC 4.0 - 10.5 K/uL 5.4 5.4 5.8  Hemoglobin 13.0 - 17.0 g/dL 9.7(L) 10.6(L) 11.9(L)  Hematocrit 39 - 52 % 30.9(L) 32.8(L) 36.5(L)  Platelets 150 - 400 K/uL 146(L) 195 193    BMP Latest Ref Rng & Units 09/15/2019 09/14/2019 09/13/2019  Glucose 70 - 99 mg/dL 151(H) 146(H) 98  BUN 6 - 20 mg/dL 21(H) 19 16  Creatinine 0.61 - 1.24 mg/dL 1.00 0.81 0.89  BUN/Creat Ratio 9 - 20 - - -  Sodium 135 - 145 mmol/L 137 137 137  Potassium 3.5 - 5.1 mmol/L 4.2 4.7 4.8  Chloride 98 - 111 mmol/L 107 107 109  CO2 22 - 32 mmol/L 22 22 21(L)  Calcium 8.9 - 10.3 mg/dL 8.5(L) 9.0 8.9      Assessment/Plan:  Active Problems:   COPD exacerbation (HCC)  [name] is a 59 y/o M with a PMH of HTN, MI, COPD, Asthma, DVT on Eliquis, and diastolic congestive heart failure, who presented with [facility] and admitted for treatment of his COPD Exacerbation.   COPD Exacerbation:  Patient weaned from 4L to room air overnight. Breathing is better, but cough still persists, will start codeine today for both cough suppression and flank pain. Cough is subjectively better on today's examination. Patient with complaints of R. Sided flank pain, ketorolac does alleviate.  - Continue Toradol PRN Q6H - Guaifenesin-codeine 100-10 mg/43mL - Respiratory Viral Panel- Negative  - Azithromycin 250 mg.  - Prednisone 40 mg QD - Duoneb 3 mL Q6H - Increase Dulera 2 puff BID 200-5 MCG/ACT  - Incruse Ellipta 1 puff QD  - Tessalon Perles PRN - Monitor vitals - daily weights - Pulse ox - PT/OT Eval and Treat  History of DVTs:   - Continue eliquis  Diastolic CHF/CAD/HLD/MI:  - Metoprolol Succinate 25 mg QD - Furosemide 20 mg PO every other day - Lipitor 40 mg QD -  Daily weights  - ASA 81 mg   Normocytic Anemia:  Patient with hgb of 11.9 presents to the ED with COPD exacerbation. Does have a slight elevation in his eosinophils likely secondary to his COPD exacerbation. Patient does oscillate between Hgb of 15 to 9.9. Patient denies dark or bloody stools. Patient is on eliquis, but currently his   hgb of 9.7 appears to be on the the low end of his normal range, with a slight thrombocytopenia. Will continue to monitor.  - Trend CBC   Prior to Admission Living Arrangement: Home Anticipated Discharge Location: Home Barriers to Discharge: Continued Medical Workup Dispo: Anticipated discharge in approximately 2-3 day(s).   Maudie Mercury, MD 09/15/2019, 9:05 AM Pager: [phone] After 5pm on weekdays and 1pm on weekends: On Call pager [phone]

## 2019-09-16 ENCOUNTER — Ambulatory Visit: Payer: Medicare Other | Admitting: General Practice

## 2019-09-16 LAB — BASIC METABOLIC PANEL
Anion gap: 8 (ref 5–15)
BUN: 23 mg/dL — ABNORMAL HIGH (ref 6–20)
CO2: 23 mmol/L (ref 22–32)
Calcium: 8.8 mg/dL — ABNORMAL LOW (ref 8.9–10.3)
Chloride: 108 mmol/L (ref 98–111)
Creatinine, Ser: 0.82 mg/dL (ref 0.61–1.24)
GFR calc Af Amer: >60 mL/min (ref >60)
GFR calc non Af Amer: >60 mL/min (ref >60)
Glucose, Bld: 120 mg/dL — ABNORMAL HIGH (ref 70–99)
Potassium: 4.3 mmol/L (ref 3.5–5.1)
Sodium: 139 mmol/L (ref 135–145)

## 2019-09-16 LAB — CBC
HCT: 32.6 % — ABNORMAL LOW (ref 39.0–52.0)
Hemoglobin: 10.4 g/dL — ABNORMAL LOW (ref 13.0–17.0)
MCH: 30.4 pg (ref 26.0–34.0)
MCHC: 31.9 g/dL (ref 30.0–36.0)
MCV: 95.3 fL (ref 80.0–100.0)
Platelets: 160 10*3/uL (ref 150–400)
RBC: 3.42 MIL/uL — ABNORMAL LOW (ref 4.22–5.81)
RDW: 14.7 % (ref 11.5–15.5)
WBC: 5.8 10*3/uL (ref 4.0–10.5)
nRBC: 0 % (ref 0.0–0.2)

## 2019-09-16 MED ORDER — IPRATROPIUM-ALBUTEROL 0.5-2.5 (3) MG/3ML IN SOLN
3.0000 mL | Freq: Four times a day (QID) | RESPIRATORY_TRACT | Status: DC | PRN
  Administered 2019-09-16 – 2019-09-17 (×2): 3 mL via RESPIRATORY_TRACT
  Filled 2019-09-16 (×2): qty 3

## 2019-09-16 MED ORDER — KETOROLAC TROMETHAMINE 15 MG/ML IJ SOLN
15.0000 mg | Freq: Four times a day (QID) | INTRAMUSCULAR | Status: DC | PRN
  Administered 2019-09-16 – 2019-09-17 (×3): 15 mg via INTRAVENOUS
  Filled 2019-09-16 (×3): qty 1

## 2019-09-16 NOTE — Progress Notes (Addendum)
Subjective:  O/N Events: None  Mr. Kielbasa was evaluated at bedside this morning. He states that he is doing better than yesterday, but is still wheezing. We discussed that he is improving since his admission and is breathing much better, and that it may take some time to alleviate his exacerbation. He states that he notices that his cough is better, and he is able to complete full sentences without coughing fits. We discussed continuing his medical course today to which he is agreeable. All questions and concerns were addressed.   Objective:  Vital signs in last 24 hours: Vitals:   09/15/19 1614 09/15/19 2038 09/15/19 2101 09/15/19 2104  BP: 108/65  122/76   Pulse: 71  76   Resp: 20   15  Temp: 97.7 F (36.5 C)  97.6 F (36.4 C)   TempSrc:   Oral   SpO2: 96% 98% 99%   Weight:       Physical Exam Constitutional:      Appearance: Normal appearance.  Cardiovascular:     Rate and Rhythm: Normal rate and regular rhythm.     Pulses: Normal pulses.     Heart sounds: Normal heart sounds. No murmur heard.  No friction rub. No gallop.   Pulmonary:     Effort: Pulmonary effort is normal.     Breath sounds: Wheezing present. No rhonchi or rales.     Comments: Faint wheezes appreciated in the L lobes, with wheezing present in the R lobes. Much improved from yesterday.  Chest:     Chest wall: No tenderness.  Abdominal:     General: Abdomen is flat. Bowel sounds are normal.     Palpations: Abdomen is soft.     Tenderness: There is no abdominal tenderness. There is no guarding.  Musculoskeletal:        General: No tenderness.     Right lower leg: No edema.     Left lower leg: No edema.  Neurological:     Mental Status: He is alert.        LABS CBC Latest Ref Rng & Units 09/16/2019 09/15/2019 09/14/2019  WBC 4.0 - 10.5 K/uL 5.8 5.4 5.4  Hemoglobin 13.0 - 17.0 g/dL 10.4(L) 9.7(L) 10.6(L)  Hematocrit 39 - 52 % 32.6(L) 30.9(L) 32.8(L)  Platelets 150 - 400 K/uL 160 146(L) 195     BMP Latest Ref Rng & Units 09/16/2019 09/15/2019 09/14/2019  Glucose 70 - 99 mg/dL 120(H) 151(H) 146(H)  BUN 6 - 20 mg/dL 23(H) 21(H) 19  Creatinine 0.61 - 1.24 mg/dL 0.82 1.00 0.81  BUN/Creat Ratio 9 - 20 - - -  Sodium 135 - 145 mmol/L 139 137 137  Potassium 3.5 - 5.1 mmol/L 4.3 4.2 4.7  Chloride 98 - 111 mmol/L 108 107 107  CO2 22 - 32 mmol/L 23 22 22   Calcium 8.9 - 10.3 mg/dL 8.8(L) 8.5(L) 9.0     Assessment/Plan:  Active Problems:   COPD exacerbation (HCC)  Mr. Avyan Livesay is a 59 y/o M with a PMH of HTN, MI, COPD, Asthma, DVT on Eliquis, and diastolic congestive heart failure, who presented with [facility] and admitted for treatment of his COPD Exacerbation.   COPD Exacerbation:  Patient weaned from 4L to room air overnight. Breathing is better, but cough still persists, will start codeine today for both cough suppression and flank pain. Cough is subjectively better on today's examination. Patient with complaints of R. Sided flank pain, ketorolac does alleviate. Increased Dulera to 200-5 mcg/act on 09/15/2019.  He sounds much improved on today's visit, lungs are clearing well. He is able to hold a full conversation without having coughing fits.  - Continue Toradol PRN Q6H - Guaifenesin-codeine 100-10 mg/52mL - Respiratory Viral Panel- Negative  - Azithromycin 250 mg.  - Prednisone 40 mg QD - Duoneb 3 mL Q6H - Dulera 2 puff BID 200-5 MCG/ACT  - Incruse Ellipta 1 puff QD  - Tessalon Perles PRN - Monitor vitals - daily weights - Pulse ox - PT/OT Eval and Treat  History of DVTs:   - Continue eliquis  Diastolic CHF/CAD/HLD/MI:  - Metoprolol Succinate 25 mg QD - Furosemide 20 mg PO every other day - Lipitor 40 mg QD - Daily weights  - ASA 81 mg   Normocytic Anemia:  Patient with hgb of 11.9 presents to the ED with COPD exacerbation. Does have a slight elevation in his eosinophils likely secondary to his COPD exacerbation. Patient does oscillate between Hgb of 15 to  9.9. Patient denies dark or bloody stools. Patient is on eliquis, but currently his   hgb of 9.7 appears to be on the the low end of his normal range, with a slight thrombocytopenia. Will continue to monitor.   - CBC stable at 10.4   Prior to Admission Living Arrangement: Home Anticipated Discharge Location: Home Barriers to Discharge: Continued Medical Workup Dispo: Anticipated discharge in approximately 1-2 day(s).   Maudie Mercury, MD 09/16/2019, 6:08 AM Pager: [phone] After 5pm on weekdays and 1pm on weekends: On Call pager [phone]

## 2019-09-16 NOTE — Plan of Care (Signed)
  Problem: Education: Goal: Knowledge of General Education information will improve Description: Including pain rating scale, medication(s)/side effects and non-pharmacologic comfort measures 09/16/2019 1521 by Joni Reining, RN Outcome: Progressing 09/16/2019 1521 by Joni Reining, RN Outcome: Progressing   Problem: Health Behavior/Discharge Planning: Goal: Ability to manage health-related needs will improve 09/16/2019 1521 by Joni Reining, RN Outcome: Progressing 09/16/2019 1521 by Joni Reining, RN Outcome: Progressing   Problem: Clinical Measurements: Goal: Respiratory complications will improve Outcome: Progressing  Reviewed home maintenance and rescue inhaler usage. Patient states he feels better from this morning, but still feels he needs rescue inhaler or prn nebulilzer.  Problem: Activity: Goal: Risk for activity intolerance will decrease Outcome: Progressing  Patient able to ambulate short distances in room without dyspnea.  Problem: Nutrition: Goal: Adequate nutrition will be maintained Outcome: Progressing  Patient tolerating meals and snacks.

## 2019-09-16 NOTE — Progress Notes (Signed)
Internal Medicine Attending:   I saw and examined the patient. I reviewed the resident's note and I agree with the resident's findings and plan as documented in the resident's note.  Patient states that his shortness of breath is much improved today but he still has some persistent wheezing.  Patient was initially made to the hospital with an acute COPD exacerbation.  On lung exam today patient still has some bilateral expiratory wheezes but much improved from yesterday.  We will continue with azithromycin and prednisone to complete a 5-day course.  Continue with cough suppressants with guaifenesin/codeine.  Continue with patient's home inhalers including Dulera and Incruse Ellipta.  PT/OT to follow-up.  No further work-up at this time.  I suspect patient should be stable for DC home tomorrow.

## 2019-09-16 NOTE — Plan of Care (Signed)

## 2019-09-17 ENCOUNTER — Other Ambulatory Visit (HOSPITAL_COMMUNITY): Payer: Self-pay | Admitting: Internal Medicine

## 2019-09-17 LAB — BASIC METABOLIC PANEL
Anion gap: 7 (ref 5–15)
BUN: 20 mg/dL (ref 6–20)
CO2: 24 mmol/L (ref 22–32)
Calcium: 8.6 mg/dL — ABNORMAL LOW (ref 8.9–10.3)
Chloride: 108 mmol/L (ref 98–111)
Creatinine, Ser: 0.7 mg/dL (ref 0.61–1.24)
GFR calc Af Amer: >60 mL/min (ref >60)
GFR calc non Af Amer: >60 mL/min (ref >60)
Glucose, Bld: 95 mg/dL (ref 70–99)
Potassium: 4.2 mmol/L (ref 3.5–5.1)
Sodium: 139 mmol/L (ref 135–145)

## 2019-09-17 LAB — CBC
HCT: 32.6 % — ABNORMAL LOW (ref 39.0–52.0)
Hemoglobin: 10.3 g/dL — ABNORMAL LOW (ref 13.0–17.0)
MCH: 30 pg (ref 26.0–34.0)
MCHC: 31.6 g/dL (ref 30.0–36.0)
MCV: 95 fL (ref 80.0–100.0)
Platelets: 165 10*3/uL (ref 150–400)
RBC: 3.43 MIL/uL — ABNORMAL LOW (ref 4.22–5.81)
RDW: 14.6 % (ref 11.5–15.5)
WBC: 7.6 10*3/uL (ref 4.0–10.5)
nRBC: 0 % (ref 0.0–0.2)

## 2019-09-17 MED ORDER — ISOSORBIDE MONONITRATE ER 30 MG PO TB24: 15.0000 mg | ORAL_TABLET | Freq: Every day | ORAL | 0 refills | Status: DC

## 2019-09-17 MED ORDER — BUDESONIDE-FORMOTEROL FUMARATE 160-4.5 MCG/ACT IN AERO: 2.0000 | INHALATION_SPRAY | Freq: Two times a day (BID) | RESPIRATORY_TRACT | 6 refills | Status: DC

## 2019-09-17 MED ORDER — GUAIFENESIN-CODEINE 100-10 MG/5ML PO SOLN: 10.0000 mL | ORAL | 0 refills | Status: DC | PRN

## 2019-09-17 MED ORDER — TAMSULOSIN HCL 0.4 MG PO CAPS: 0.4000 mg | ORAL_CAPSULE | Freq: Every day | ORAL | 0 refills | Status: DC

## 2019-09-17 MED FILL — TAMSULOSIN HCL 0.4 MG CAP: 0.4 | 30 days supply | Qty: 30 | Fill #0

## 2019-09-17 MED FILL — SYMBICORT 160-4.5 MCG INH: 160-4.5 | 30 days supply | Qty: 10 | Fill #0

## 2019-09-17 MED FILL — ISOSORBIDE MN ER 30 MG TAB: 30 | 30 days supply | Qty: 15 | Fill #0

## 2019-09-17 MED FILL — GUAIATUSSIN AC LIQUID: 100-10 | 3 days supply | Qty: 120 | Fill #0

## 2019-09-17 NOTE — Plan of Care (Signed)
Pt is adequate for discharge. 

## 2019-09-17 NOTE — Progress Notes (Addendum)
Subjective:  O/N Events: None  Mr. Runyon was evaluated at bedside this AM. Patient states he fluctuates between feeling better and worse. He states that he still gets tired when he gets up and walks. He was concerned why he felt short of breath but is SpO2 was reading 100%. He was counseled regarding COPD and admits to understanding. Otherwise denies new symptoms at this time. He states that he sees Dr. Teressa Senter with [facility]. He was counseled on the importance of smoking cessation.   Objective:  Vital signs in last 24 hours: Vitals:   09/16/19 0737 09/16/19 1604 09/16/19 1635 09/16/19 2314  BP:  127/77  (!) 146/86  Pulse:  74    Resp:  18    Temp:  98.2 F (36.8 C)  (!) 97.3 F (36.3 C)  TempSrc:  Oral  Oral  SpO2: 95% 96% 97% 98%  Weight:       Physical Exam Vitals reviewed.  Constitutional:      General: He is not in acute distress.    Appearance: Normal appearance.     Comments: Sitting comfortably in bed. No coughing fits throughout the entire interview at bedside.   Cardiovascular:     Rate and Rhythm: Normal rate and regular rhythm.     Pulses: Normal pulses.     Heart sounds: Normal heart sounds. No murmur heard.   Pulmonary:     Effort: Pulmonary effort is normal.     Breath sounds: No rhonchi or rales.     Comments: Slight wheezing present in both upper and lower pulmonary fields bilaterally, much improved from yesterday.  Neurological:     Mental Status: He is alert.        LABS CBC Latest Ref Rng & Units 09/16/2019 09/15/2019 09/14/2019  WBC 4.0 - 10.5 K/uL 5.8 5.4 5.4  Hemoglobin 13.0 - 17.0 g/dL 10.4(L) 9.7(L) 10.6(L)  Hematocrit 39 - 52 % 32.6(L) 30.9(L) 32.8(L)  Platelets 150 - 400 K/uL 160 146(L) 195    BMP Latest Ref Rng & Units 09/16/2019 09/15/2019 09/14/2019  Glucose 70 - 99 mg/dL 120(H) 151(H) 146(H)  BUN 6 - 20 mg/dL 23(H) 21(H) 19  Creatinine 0.61 - 1.24 mg/dL 0.82 1.00 0.81  BUN/Creat Ratio 9 - 20 - - -  Sodium 135 - 145 mmol/L 139 137 137    Potassium 3.5 - 5.1 mmol/L 4.3 4.2 4.7  Chloride 98 - 111 mmol/L 108 107 107  CO2 22 - 32 mmol/L 23 22 22   Calcium 8.9 - 10.3 mg/dL 8.8(L) 8.5(L) 9.0     Assessment/Plan:  Active Problems:   COPD exacerbation (HCC)  Mr. Ancil Dewan is a 59 y/o M with a PMH of HTN, MI, COPD, Asthma, DVT on Eliquis, and diastolic congestive heart failure, who presented with [facility] and admitted for treatment of his COPD Exacerbation.   COPD Exacerbation:  Still having some mild wheezing but overall improved. He has been on room air without difficulty breathing. Will need to complete prednisone one more day and follow-up with PCP.  - Guaifenesin-codeine 100-10 mg/72mL prn - complete Azithromycin 250 mg  - complete steroids today - Duoneb 3 mL Q6H prn - Increased Dulera 2 puff BID 200-5 MCG/ACT  - Incruse Ellipta 1 puff QD  - Tessalon Perles PRN  History of DVTs:   - Continue eliquis  Diastolic CHF/CAD/HLD/MI:  Will need follow-up with PCP for titration of his lasix. Euvolemic on exam.  - Metoprolol Succinate 25 mg QD - Furosemide 20 mg PO  every other day - Lipitor 40 mg QD - Daily weights  - ASA 81 mg   Normocytic Anemia:  Hemoglobin stable at 11 with normocytic anemia. No iron deficiency. B12 borderline.  - CBC - recommend f/u MMA with PCP.    Prior to Admission Living Arrangement: Home Anticipated Discharge Location: Home Barriers to Discharge: Continued Medical Workup Dispo: Anticipated discharge today  Marty Heck, DO 09/17/2019, 9:29 AM Pager: [phone]

## 2019-09-17 NOTE — Discharge Instructions (Signed)
To [name],  It was a pleasure working with you during your hospitalization at [facility]. You were diagnosed with a COPD exacerbation and were treated with breathing treatments and steroids to reduce your inflammation. If you experiencing worsening breathing, shortness of breath, dizziness, or change in mental status, please go to the nearest emergency department for evaluation.

## 2019-09-17 NOTE — Progress Notes (Signed)
Internal Medicine Attending:   I saw and examined the patient. I reviewed the resident's note and I agree with the resident's findings and plan as documented in the resident's note.  Patient states that his shortness of breath has improved but he still feels tired when he gets up and walks.  Patient was initially admitted to the hospital with an acute COPD exacerbation.  Patient's lung exam is much improved today with only slight wheezing noted in bilateral lung fields.  Will complete 5-day course of azithromycin and prednisone.  Continue with Dulera and Ellipta.  Patient's cough has improved but can continue with guaifenesin codeine as needed.  No further work-up at this time.  Patient stable for DC home today.

## 2019-09-18 ENCOUNTER — Telehealth: Payer: Self-pay

## 2019-09-18 ENCOUNTER — Ambulatory Visit: Payer: Medicare Other | Admitting: Internal Medicine

## 2019-09-18 NOTE — Telephone Encounter (Signed)
Transition Care Management Follow-up Telephone Call  Date of discharge and from where: 09/17/2019, [facility]   How have you been since you were released from the hospital? He said he is feeling fine.   Any questions or concerns?  none at this time  Items Reviewed:  Did the pt receive and understand the discharge instructions provided? yes and he has no questions  Medications obtained and verified?  he said that he has all medications, including the new ones and did not have any questions about his med regime.   Any new allergies since your discharge?none reported   Do you have support at home?  lives alone  Other (ie: DME, Home Health, etc)  No home health or DME ordered.   Does not have a scale for daily weights  Has nebulizer  Functional Questionnaire: (I = Independent and D = Dependent) ADL's: independent   Follow up appointments reviewed:    PCP Hospital f/u appt confirmed?scheduled appt with Dr Margarita Rana 10/01/2019 @ 1050.  cancelled appt with [place] walk in for 10/09/2019  Specialist Hospital f/u appt confirmed?none scheduled at this time   Are transportation arrangements needed? no, he uses the bus and also has [employer]  If their condition worsens, is the pt aware to call  their PCP or go to the ED?  yes  Was the patient provided with contact information for the PCP's office or ED?  he has the clinic phone number  Was the pt encouraged to call back with questions or concerns? yes

## 2019-09-18 NOTE — Telephone Encounter (Signed)
From the discharge call. Patient has appt with Dr Margarita Rana 10/01/2019.   He said he is feeling fine, no questions or concerns at this time     he said that he has all medications, including the new ones and did not have any questions about his med regime.   Does not have a scale for daily weights

## 2019-09-18 NOTE — Discharge Summary (Signed)
Name: [name]: [id number] DOB: 13-Jul-1960 59 y.o. PCP: [name] Rakes, MD  Date of Admission: 09/13/2019 11:43 AM Date of Discharge: 09/17/2019 Attending Physician: [name] Contes MD Discharge Diagnosis: 1. COPD Exacerbation  2. History of DVTs 3. Diastolic Congestive Heart Failure 4. Hyperlipidemia 5. Coronary Artery Disease  Discharge Medications: Allergies as of 09/17/2019   No Known Allergies     Medication List    STOP taking these medications   budesonide-formoterol 80-4.5 MCG/ACT inhaler Commonly known as: SYMBICORT Replaced by: budesonide-formoterol 160-4.5 MCG/ACT inhaler   traMADol 50 MG tablet Commonly known as: ULTRAM     TAKE these medications   albuterol (2.5 MG/3ML) 0.083% nebulizer solution Commonly known as: PROVENTIL Take 3 mLs (2.5 mg total) by nebulization every 6 (six) hours as needed for wheezing or shortness of breath.   albuterol 108 (90 Base) MCG/ACT inhaler Commonly known as: ProAir HFA Inhale 2 puffs into the lungs every 6 (six) hours as needed for wheezing or shortness of breath.   apixaban 5 MG Tabs tablet Commonly known as: Eliquis Take 1 tablet (5 mg total) by mouth 2 (two) times daily.   aspirin EC 81 MG tablet Take 1 tablet (81 mg total) by mouth daily.   atorvastatin 40 MG tablet Commonly known as: LIPITOR Take 1 tablet (40 mg total) by mouth daily at 6 PM.   budesonide-formoterol 160-4.5 MCG/ACT inhaler Commonly known as: Symbicort Inhale 2 puffs into the lungs 2 (two) times daily. Replaces: budesonide-formoterol 80-4.5 MCG/ACT inhaler   cetirizine 10 MG tablet Commonly known as: ZYRTEC Take 1 tablet (10 mg total) by mouth daily.   fluticasone 50 MCG/ACT nasal spray Commonly known as: FLONASE PLACE 2 SPRAYS INTO BOTH NOSTRILS 2 (TWO) TIMES DAILY. What changed:   when to take this  reasons to take this   furosemide 40 MG tablet Commonly known as: LASIX Take 1 tablet (40 mg total) by mouth  daily. What changed:   when to take this  reasons to take this   gabapentin 300 MG capsule Commonly known as: NEURONTIN Take 1 capsule (300 mg total) by mouth 2 (two) times daily. What changed:   how much to take  when to take this  reasons to take this   guaiFENesin-codeine 100-10 MG/5ML syrup Take 10 mLs by mouth every 4 (four) hours as needed for cough.   isosorbide mononitrate 30 MG 24 hr tablet Commonly known as: IMDUR Take 0.5 tablets (15 mg total) by mouth daily.   metoprolol succinate 25 MG 24 hr tablet Commonly known as: Toprol XL Take 1 tablet (25 mg total) by mouth daily.   nitroGLYCERIN 0.4 MG SL tablet Commonly known as: NITROSTAT Place 1 tablet (0.4 mg total) under the tongue every 5 (five) minutes as needed for chest pain.   pantoprazole 40 MG tablet Commonly known as: PROTONIX Take 40 mg by mouth daily.   tamsulosin 0.4 MG Caps capsule Commonly known as: FLOMAX Take 1 capsule (0.4 mg total) by mouth daily.   tiZANidine 4 MG tablet Commonly known as: Zanaflex Take 1 tablet (4 mg total) by mouth every 8 (eight) hours as needed for muscle spasms. What changed:   when to take this  additional instructions   umeclidinium bromide 62.5 MCG/INH Aepb Commonly known as: INCRUSE ELLIPTA Inhale 1 puff into the lungs daily.       Disposition and follow-up:   [name] was discharged from [facility] in Stable condition.  At the hospital follow  up visit please address:  1. COPD Exacerbation:   - COPD medication optimization  2. History of DVTs Diastolic Congestive Heart Failure Hyperlipidemia Coronary Artery Disease - Continue medications   2.  Labs / imaging needed at time of follow-up: None  3.  Pending labs/ test needing follow-up: None  Follow-up Appointments:   Hospital Course by problem list: 1.  COPD Exacerbation:  Patient presented to the hospital with shortness of breath and continuous coughing fits  that were resistant to home inhaler use. He was found to emphysematous change on imaging, productive cough with yellow sputum, and had diffuse wheezing bilaterally on physical examination. He was treated in the emergency department with solumedrol, magnesium sulfate, atrovent, and ventolin. He was started on prednisone, azithromycin, Duonebs, Dulera, and Ellipta.  During his admission he was noted to have eosinophilia, and his Ruthe Mannan was increased. After several days of breathing treatments, his shortness of breath and cough alleviated and he was discharged in stable condition.    Discharge Vitals:   BP (!) 143/76 (BP Location: Right Arm)   Pulse 66   Temp 98 F (36.7 C) (Oral)   Resp 19   Wt 96.3 kg   SpO2 98%   BMI 27.26 kg/m   Pertinent Labs, Studies, and Procedures:   Ref Range & Units 6 d ago  (09/13/19) 3 mo ago  (06/09/19) 2 yr ago  (06/30/17) 2 yr ago  (04/10/17)  WBC 4.0 - 10.5 K/uL 5.8  5.2 R  7.1  5.0   RBC 4.22 - 5.81 MIL/uL 3.83Low  4.15 R  4.89  3.44Low   Hemoglobin 13.0 - 17.0 g/dL 11.9Low  13.0 R  15.1  10.4Low   HCT 39 - 52 % 36.5Low  38.4 R  46.1  32.2Low   MCV 80.0 - 100.0 fL 95.3  93 R  94.3 R  93.6 R   MCH 26.0 - 34.0 pg 31.1  31.3 R  30.9  30.2   MCHC 30.0 - 36.0 g/dL 32.6  33.9 R  32.8  32.3   RDW 11.5 - 15.5 % 14.6  13.0 R  14.0  14.2   Platelets 150 - 400 K/uL 193  212 R  100Low CM  166   nRBC 0.0 - 0.2 % 0.0      Neutrophils Relative % % 49   65    Neutro Abs 1.7 - 7.7 K/uL 2.9   4.6    Lymphocytes Relative % 29   19    Lymphs Abs 0.7 - 4.0 K/uL 1.7   1.3    Monocytes Relative % 11   13    Monocytes Absolute 0 - 1 K/uL 0.6   0.9    Eosinophils Relative % 9   3    Eosinophils Absolute 0 - 0 K/uL 0.6High   0.2    Basophils Relative % 1   0    Basophils Absolute 0 - 0 K/uL 0.0   0.0 CM    Immature Granulocytes % 1      Abs Immature Granulocytes 0.00 - 0.07 K/uL 0.03       CBC Latest Ref Rng & Units 09/17/2019 09/16/2019 09/15/2019  WBC 4.0 -  10.5 K/uL 7.6 5.8 5.4  Hemoglobin 13.0 - 17.0 g/dL 10.3(L) 10.4(L) 9.7(L)  Hematocrit 39 - 52 % 32.6(L) 32.6(L) 30.9(L)  Platelets 150 - 400 K/uL 165 160 146(L)    BMP Latest Ref Rng & Units 09/17/2019 09/16/2019 09/15/2019  Glucose 70 - 99 mg/dL 95  120(H) 151(H)  BUN 6 - 20 mg/dL 20 23(H) 21(H)  Creatinine 0.61 - 1.24 mg/dL 0.70 0.82 1.00  BUN/Creat Ratio 9 - 20 - - -  Sodium 135 - 145 mmol/L 139 139 137  Potassium 3.5 - 5.1 mmol/L 4.2 4.3 4.2  Chloride 98 - 111 mmol/L 108 108 107  CO2 22 - 32 mmol/L 24 23 22   Calcium 8.9 - 10.3 mg/dL 8.6(L) 8.8(L) 8.5(L)    EXAM: PORTABLE CHEST 1 VIEW  COMPARISON:  08/30/2018  FINDINGS: The heart size and pulmonary vascularity are normal. No infiltrates or effusions. Lucency in the right mid and upper lung zones consistent with the emphysematous changes demonstrated on the CT scan on 12/06/2017.  No bone abnormality.  IMPRESSION: No acute cardiopulmonary disease. Emphysematous changes in the right mid and upper lung zones.  Discharge Instructions: Discharge Instructions    Call MD for:  difficulty breathing, headache or visual disturbances   Complete by: As directed    Call MD for:  persistant dizziness or light-headedness   Complete by: As directed    Diet - low sodium heart healthy   Complete by: As directed    Increase activity slowly   Complete by: As directed       Signed: Maudie Mercury, MD 09/18/2019, 2:12 PM   Pager: [phone]

## 2019-09-19 LAB — METHYLMALONIC ACID, SERUM: Methylmalonic Acid, Quantitative: 763 nmol/L — ABNORMAL HIGH (ref 0–378)

## 2019-09-22 DIAGNOSIS — R29898 Other symptoms and signs involving the musculoskeletal system: Secondary | ICD-10-CM | POA: Diagnosis not present

## 2019-09-30 ENCOUNTER — Other Ambulatory Visit: Payer: Self-pay | Admitting: Internal Medicine

## 2019-09-30 DIAGNOSIS — E538 Deficiency of other specified B group vitamins: Secondary | ICD-10-CM

## 2019-09-30 NOTE — Progress Notes (Signed)
Patient called and notified of B12 deficiency. Questions answered and he will pick up supplemental B12 from his pharmacy.

## 2019-10-01 ENCOUNTER — Ambulatory Visit: Payer: Medicare Other | Admitting: Family Medicine

## 2019-10-02 MED FILL — TAMSULOSIN HCL 0.4 MG CAP: 0.4 | 30 days supply | Qty: 30 | Fill #0

## 2019-10-02 MED FILL — ISOSORBIDE MN ER 30 MG TAB: 30 | 30 days supply | Qty: 15 | Fill #0

## 2019-10-09 ENCOUNTER — Inpatient Hospital Stay: Payer: Medicare Other

## 2019-10-21 DIAGNOSIS — R29898 Other symptoms and signs involving the musculoskeletal system: Secondary | ICD-10-CM | POA: Diagnosis not present

## 2019-10-23 MED ORDER — VITAMIN B-12 1000 MCG PO TABS: 1000.0000 ug | ORAL_TABLET | Freq: Every day | ORAL | 5 refills | Status: DC

## 2019-10-28 ENCOUNTER — Ambulatory Visit: Payer: Medicare Other | Admitting: Internal Medicine

## 2019-11-04 ENCOUNTER — Ambulatory Visit: Payer: Medicare Other | Admitting: Family Medicine

## 2019-11-11 ENCOUNTER — Ambulatory Visit: Payer: Medicare Other | Admitting: Family Medicine

## 2019-11-25 MED FILL — ATORVASTATIN CALCIUM 40 MG: 40 | 30 days supply | Qty: 30 | Fill #4

## 2019-11-25 MED FILL — SYMBICORT 160-4.5 MCG INH: 160-4.5 | 30 days supply | Qty: 10 | Fill #1

## 2019-11-25 MED FILL — PANTOPRAZOLE SOD DR 40 MG T: 40 | 30 days supply | Qty: 30 | Fill #0

## 2019-11-25 MED FILL — INCRUSE ELLIPTA 62.5 MCG IN: 62.5 | 30 days supply | Qty: 30 | Fill #4

## 2019-11-25 MED FILL — ISOSORBIDE MN ER 30 MG TAB: 30 | 30 days supply | Qty: 15 | Fill #1

## 2019-11-25 MED FILL — GABAPENTIN 300 MG CAPSULE: 300 | 30 days supply | Qty: 60 | Fill #4

## 2019-11-25 MED FILL — METOPROLOL SUCCINATE ER 25: 25 | 90 days supply | Qty: 90 | Fill #1

## 2019-11-25 MED FILL — tiZANidine HCL 4 MG TABS: 4 | 20 days supply | Qty: 60 | Fill #1

## 2019-11-25 MED FILL — ALBUTEROL SULFATE HFA 108 (: 108 (90 BAS | 16 days supply | Qty: 9 | Fill #1

## 2019-11-25 MED FILL — ELIQUIS 5 MG TABLET: 5 | 30 days supply | Qty: 60 | Fill #4

## 2019-11-26 MED FILL — ALBUTEROL SUL 2.5 MG/3 ML S: (2.5 MG/3ML | 6 days supply | Qty: 75 | Fill #3

## 2019-12-15 ENCOUNTER — Other Ambulatory Visit: Payer: Self-pay

## 2019-12-15 ENCOUNTER — Ambulatory Visit (INDEPENDENT_AMBULATORY_CARE_PROVIDER_SITE_OTHER): Payer: Medicare Other | Admitting: Internal Medicine

## 2019-12-15 ENCOUNTER — Other Ambulatory Visit: Payer: Self-pay | Admitting: Internal Medicine

## 2019-12-15 VITALS — BP 142/100 | HR 64 | Temp 97.1°F | Ht 74.0 in | Wt 216.4 lb

## 2019-12-15 DIAGNOSIS — I10 Essential (primary) hypertension: Secondary | ICD-10-CM | POA: Diagnosis not present

## 2019-12-15 DIAGNOSIS — I25119 Atherosclerotic heart disease of native coronary artery with unspecified angina pectoris: Secondary | ICD-10-CM

## 2019-12-15 DIAGNOSIS — I739 Peripheral vascular disease, unspecified: Secondary | ICD-10-CM | POA: Diagnosis not present

## 2019-12-15 DIAGNOSIS — Z789 Other specified health status: Secondary | ICD-10-CM

## 2019-12-15 DIAGNOSIS — M48062 Spinal stenosis, lumbar region with neurogenic claudication: Secondary | ICD-10-CM

## 2019-12-15 DIAGNOSIS — R0600 Dyspnea, unspecified: Secondary | ICD-10-CM

## 2019-12-15 DIAGNOSIS — I208 Other forms of angina pectoris: Secondary | ICD-10-CM

## 2019-12-15 DIAGNOSIS — R0609 Other forms of dyspnea: Secondary | ICD-10-CM

## 2019-12-15 DIAGNOSIS — I5022 Chronic systolic (congestive) heart failure: Secondary | ICD-10-CM | POA: Diagnosis not present

## 2019-12-15 MED ORDER — ISOSORBIDE MONONITRATE ER 30 MG PO TB24: 30.0000 mg | ORAL_TABLET | Freq: Every day | ORAL | 1 refills | Status: DC

## 2019-12-15 NOTE — Patient Instructions (Signed)
Medication Instructions:  INCREASE IMDUR to 30mg  (1 tablet daily) *If you need a refill on your cardiac medications before your next appointment, please call your pharmacy*  Lab Work: None Ordered At This Time.  If you have labs (blood work) drawn today and your tests are completely normal, you will receive your results only by: Marland Kitchen MyChart Message (if you have MyChart) OR . A paper copy in the mail If you have any lab test that is abnormal or we need to change your treatment, we will call you to review the results.  Testing/Procedures: None Ordered At This Time.   Follow-Up: At [facility], you and your health needs are our priority.  As part of our continuing mission to provide you with exceptional heart care, we have created designated Provider Care Teams.  These Care Teams include your primary Cardiologist (physician) and Advanced Practice Providers (APPs -  Physician Assistants and Nurse Practitioners) who all work together to provide you with the care you need, when you need it.  We recommend signing up for the patient portal called "MyChart".  Sign up information is provided on this After Visit Summary.  MyChart is used to connect with patients for Virtual Visits (Telemedicine).  Patients are able to view lab/test results, encounter notes, upcoming appointments, etc.  Non-urgent messages can be sent to your provider as well.   To learn more about what you can do with MyChart, go to [email or web address].    Your next appointment:   2 month(s)  The format for your next appointment:   In Person  Provider:   Cherlynn Kaiser, MD  Other Instructions REFERRAL TO [facility] for Statin Intolerance- APPOINTMENT at NEXT AVAILABLE

## 2019-12-15 NOTE — Progress Notes (Addendum)
Cardiology Office Note:    Date:  12/15/2019   ID:  [name], DOB 1960/11/30, MRN [id number]  PCP:  Charlott Rakes, MD  Cardiologist:  Elouise Munroe, MD  Electrophysiologist:  None   Referring MD: Charlott Rakes, MD   Chief Complaint/Reason for Referral: Follow up  History of Present Illness:    [name] is a 59 y.o. male with a history of CAD (s/p BMS to D1 in 2015, noted to be chronically occluded by cath in 12/2014 with scattered 30% RCA stenosis, 20% LCx, and 50% Prox-Mid LAD stenosis at that time), PE/DVT (s/p IVC filter placement in 2012), COPD, chronic diastolic CHF (EF 07-37% by echo in 12/2014), HTN, HLD, chronic Hepatitis C, substance use (Cocaine - UDS from 01/2016 positive), and tobacco use.   He was concerned about bilateral leg pain.  Dr. Fletcher Anon who saw him for abnormal vascular studies recommended stopping his atorvastatin.  His leg pain is gone away and he is feeling better.  He does however note that his legs still feel heavy.  I suspect this is related to spinal stenosis as did Dr. Fletcher Anon in his note from April.  I have asked the patient to contact his primary care physician to discuss this further and possibly refer to a spine specialist for further management.  Chest heaviness and SOB with exertion continues- no ischemia on stress in feb, continues to smoke.  Recently had an admission for COPD exacerbation to the hospital.  He has a prescription for nitroglycerin but has not used it.  He quit smoking 12/14/19 (yesterday).   Past Medical History:  Diagnosis Date  . Asthma   . CAD (coronary artery disease)    a. 09/2013 NSTEMI/PCI: LM nl, LAD 40-50%, D1 100 (2.25 x 28 Vision BMS), LCX min irregs, RI 60-70, 30, RCA 40-50/50-17ms/p, EF 45-50%.  b. cath 12/2014 -occulded BMS in diag, 50% pro to mid LAD, 60% ramus, 30% RCA   . Chest pain 08/2015  . Chronic diastolic CHF (congestive heart failure) ([place])    a. 09/2014 EF 45-50% by LV gram;  b. 01/2014  Echo: EF 55-60%, Gr 1 DD.  Marland Kitchen Cocaine abuse ([place])   . COPD (chronic obstructive pulmonary disease) ([place])   . DVT (deep venous thrombosis) ([place])    "I had ~ 10 in each leg" (12/20/2016)  . Essential hypertension   . Hepatitis C    "clear free over a year now"  . High cholesterol   . Myocardial infarction ([place]) ~ 2014  . Osteoarthritis    a. hands and toes.  . Pneumonia    "several times" (12/20/2016)  . Pulmonary embolism ([place])    a. 2012 - s/p IVC filter;  b. prev on eliquis - noncompliant.  . Shortness of breath dyspnea   . Sinus headache   . Squamous cell skin cancer 07/13/2016   "foot"  . Tobacco abuse     Past Surgical History:  Procedure Laterality Date  . CARDIAC CATHETERIZATION N/A 01/04/2015   Procedure: Left Heart Cath and Coronary Angiography;  Surgeon: Burnell Blanks, MD;  Location: [place] [facility];  Service: Cardiovascular;  Laterality: N/A;  . CORONARY ANGIOPLASTY WITH STENT PLACEMENT  10/21/13   BMS to D1  . CYST EXCISION N/A 05/10/2016   Procedure: EXCISION OF SEBACEOUS CYST UPPER BACK;  Surgeon: Donnie Mesa, MD;  Location: [place];  Service: General;  Laterality: N/A;  . FOOT SURGERY Right ~ 06/2016   "said there was skin cancer  on it"  . LEFT HEART CATHETERIZATION WITH CORONARY ANGIOGRAM N/A 10/21/2013   Procedure: LEFT HEART CATHETERIZATION WITH CORONARY ANGIOGRAM;  Surgeon: Leonie Man, MD;  [facility] C[facility];  Service: Cardiovascular;  Laterality: N/A;  . VENA CAVA FILTER PLACEMENT  ~ 2007-~ 2017   "1 in my neck; 2 in my wrist"    Current Medications: Current Meds  Medication Sig  . albuterol (PROAIR HFA) 108 (90 Base) MCG/ACT inhaler Inhale 2 puffs into the lungs every 6 (six) hours as needed for wheezing or shortness of breath.  Marland Kitchen albuterol (PROVENTIL) (2.5 MG/3ML) 0.083% nebulizer solution Take 3 mLs (2.5 mg total) by nebulization every 6 (six) hours as needed for wheezing or shortness of breath.  Marland Kitchen apixaban (ELIQUIS) 5 MG TABS tablet Take  1 tablet (5 mg total) by mouth 2 (two) times daily.  . budesonide-formoterol (SYMBICORT) 160-4.5 MCG/ACT inhaler Inhale 2 puffs into the lungs 2 (two) times daily.  . cetirizine (ZYRTEC) 10 MG tablet Take 1 tablet (10 mg total) by mouth daily.  . fluticasone (FLONASE) 50 MCG/ACT nasal spray PLACE 2 SPRAYS INTO BOTH NOSTRILS 2 (TWO) TIMES DAILY. (Patient taking differently: Place 2 sprays into both nostrils 2 (two) times daily as needed for allergies or rhinitis. )  . furosemide (LASIX) 40 MG tablet Take 1 tablet (40 mg total) by mouth daily. (Patient taking differently: Take 40 mg by mouth daily as needed for fluid or edema. )  . gabapentin (NEURONTIN) 300 MG capsule Take 1 capsule (300 mg total) by mouth 2 (two) times daily. (Patient taking differently: Take 300-600 mg by mouth at bedtime as needed (pain). )  . metoprolol succinate (TOPROL XL) 25 MG 24 hr tablet Take 1 tablet (25 mg total) by mouth daily.  . pantoprazole (PROTONIX) 40 MG tablet Take 40 mg by mouth daily.  . tamsulosin (FLOMAX) 0.4 MG CAPS capsule Take 1 capsule (0.4 mg total) by mouth daily. (Patient taking differently: Take 0.4 mg by mouth daily. As Needed)  . tiZANidine (ZANAFLEX) 4 MG tablet Take 1 tablet (4 mg total) by mouth every 8 (eight) hours as needed for muscle spasms. (Patient taking differently: Take 4 mg by mouth See admin instructions. Take one tablet (4 mg) by mouth every other night Patient taking as needed)  . umeclidinium bromide (INCRUSE ELLIPTA) 62.5 MCG/INH AEPB Inhale 1 puff into the lungs daily.  . vitamin B-12 (CYANOCOBALAMIN) 1000 MCG tablet Take 1 tablet (1,000 mcg total) by mouth daily.     Allergies:   Patient has no known allergies.   Social History   Tobacco Use  . Smoking status: Current Every Day Smoker    Packs/day: 0.25    Years: 39.00    Pack years: 9.75    Types: Cigarettes, Cigars  . Smokeless tobacco: Never Used  Vaping Use  . Vaping Use: Never used  Substance Use Topics  . Alcohol  use: Yes    Alcohol/week: 6.0 standard drinks    Types: 2 Cans of beer, 4 Shots of liquor per week  . Drug use: Not Currently    Types: Cocaine    Comment: 12/20/2016 "might have used some the other day; I'm not sure"     Family History: The patient's family history includes Allergies in his brother, mother, and sister; Asthma in his mother; Coronary artery disease in his father; Deep vein thrombosis in his brother; Heart attack in his father. There is no history of Colon cancer, Liver cancer, Esophageal cancer, Colon polyps, Rectal  cancer, Pancreatic cancer, or Stomach cancer.  ROS:   Please see the history of present illness.    All other systems reviewed and are negative.  EKGs/Labs/Other Studies Reviewed:    The following studies were reviewed today:  EKG:  NSR  Recent Labs: 01/21/2019: ALT 16 04/21/2019: BNP 43.5 09/14/2019: Magnesium 2.3 09/17/2019: BUN 20; Creatinine, Ser 0.70; Hemoglobin 10.3; Platelets 165; Potassium 4.2; Sodium 139  Recent Lipid Panel    Component Value Date/Time   CHOL 145 01/21/2019 0948   TRIG 87 01/21/2019 0948   HDL 57 01/21/2019 0948   CHOLHDL 2.5 01/21/2019 0948   CHOLHDL 3.1 01/01/2015 0208   VLDL 12 01/01/2015 0208   LDLCALC 72 01/21/2019 0948    Physical Exam:    VS:  BP (!) 142/100   Pulse 64   Temp (!) 97.1 F (36.2 C)   Ht 6\' 2"  (1.88 m)   Wt 216 lb 6.4 oz (98.2 kg)   SpO2 98%   BMI 27.78 kg/m     Wt Readings from Last 5 Encounters:  12/15/19 216 lb 6.4 oz (98.2 kg)  09/15/19 212 lb 4.9 oz (96.3 kg)  08/05/19 212 lb (96.2 kg)  07/01/19 203 lb 9.6 oz (92.4 kg)  06/25/19 199 lb 3.2 oz (90.4 kg)    Constitutional: No acute distress Eyes: sclera non-icteric, normal conjunctiva and lids ENMT: normal dentition, moist mucous membranes Cardiovascular: regular rhythm, normal rate, no murmurs. S1 and S2 normal. Radial pulses normal bilaterally. No jugular venous distention.  Respiratory: clear to auscultation bilaterally GI :  normal bowel sounds, soft and nontender. No distention.   MSK: extremities warm, well perfused. No edema.  NEURO: grossly nonfocal exam, moves all extremities. PSYCH: alert and oriented x 3, normal mood and affect.   ASSESSMENT:    1. Coronary artery disease involving native coronary artery of native heart with angina pectoris ([place])   2. Essential hypertension   3. Statin intolerance   4. Chronic systolic heart failure ([place])   5. PAD (peripheral artery disease) ([place])   6. Dyspnea on exertion   7. Stable angina pectoris ([place])   8. Pseudoclaudication    PLAN:    Coronary artery disease involving native coronary artery of native heart with angina pectoris ([place]) - Plan: EKG 12-Lead, AMB Referral to [facility] Pharm-D Essential hypertension - Plan: EKG 12-Lead Statin intolerance - Plan: EKG 12-Lead, AMB Referral to [facility] Pharm[facility]D He has a history of coronary artery disease.  Continues on apixaban 5 mg twice daily.  Intolerant of statin at 40 mg daily.  Would like for him to visit with the pharmacist for continued management of secondary prevention of CAD and possible consideration for PCSK9 inhibitor given his peripheral arterial disease, coronary artery disease.  Lipids were well managed on atorvastatin 40 mg daily.  He also takes metoprolol succinate 25 mg daily.  -for chest pain we will increase Imdur to 30 mg daily and monitor symptoms.  Chronic systolic heart failure (HCC) Heart Failure Therapy ACE-I/ARB/ARNI: recommend starting at next visit, losartan or Entresto BB: metoprolol succinate 25 mg daily MRA: none, consider starting SGLT2I: none yet. Recommend starting Diuretic plan: furosemide 40 mg daily  PAD (peripheral artery disease) (HCC) Pseudoclaudication -seems most consistent with spinal stenosis. Recommend PCP eval and referral if indicated.  Dyspnea on exertion  - recent COPD exacerbation. Will uptitrate nitrate to see if it is anginal equivalent.   Stable angina  pectoris (HCC) - increase nitrate.    Total time of encounter: 30  minutes total time of encounter, including 20 minutes spent in face-to-face patient care on the date of this encounter. This time includes coordination of care and counseling regarding above mentioned problem list. Remainder of non-face-to-face time involved reviewing chart documents/testing relevant to the patient encounter and documentation in the medical record. I have independently reviewed documentation from referring provider.   Cherlynn Kaiser, MD [place]  [facility]    Medication Adjustments/Labs and Tests Ordered: Current medicines are reviewed at length with the patient today.  Concerns regarding medicines are outlined above.   Orders Placed This Encounter  Procedures  . AMB Referral to [facility] Pharm[facility]D  . EKG 12-Lead    Meds ordered this encounter  Medications  . isosorbide mononitrate (IMDUR) 30 MG 24 hr tablet    Sig: Take 1 tablet (30 mg total) by mouth daily.    Dispense:  30 tablet    Refill:  1    Patient Instructions  Medication Instructions:  INCREASE IMDUR to 30mg  (1 tablet daily) *If you need a refill on your cardiac medications before your next appointment, please call your pharmacy*  Lab Work: None Ordered At This Time.  If you have labs (blood work) drawn today and your tests are completely normal, you will receive your results only by: Marland Kitchen MyChart Message (if you have MyChart) OR . A paper copy in the mail If you have any lab test that is abnormal or we need to change your treatment, we will call you to review the results.  Testing/Procedures: None Ordered At This Time.   Follow-Up: At [facility], you and your health needs are our priority.  As part of our continuing mission to provide you with exceptional heart care, we have created designated Provider Care Teams.  These Care Teams include your primary Cardiologist (physician) and Advanced Practice Providers (APPs -   Physician Assistants and Nurse Practitioners) who all work together to provide you with the care you need, when you need it.  We recommend signing up for the patient portal called "MyChart".  Sign up information is provided on this After Visit Summary.  MyChart is used to connect with patients for Virtual Visits (Telemedicine).  Patients are able to view lab/test results, encounter notes, upcoming appointments, etc.  Non-urgent messages can be sent to your provider as well.   To learn more about what you can do with MyChart, go to [email or web address].    Your next appointment:   2 month(s)  The format for your next appointment:   In Person  Provider:   Cherlynn Kaiser, MD  Other Instructions REFERRAL TO [facility] for Statin Intolerance- APPOINTMENT at NEXT AVAILABLE

## 2020-01-01 ENCOUNTER — Ambulatory Visit: Payer: Medicare Other | Attending: Family Medicine | Admitting: Family Medicine

## 2020-01-01 ENCOUNTER — Encounter: Payer: Self-pay | Admitting: Family Medicine

## 2020-01-01 ENCOUNTER — Other Ambulatory Visit: Payer: Self-pay

## 2020-01-01 ENCOUNTER — Other Ambulatory Visit: Payer: Self-pay | Admitting: Family Medicine

## 2020-01-01 VITALS — BP 94/63 | HR 69 | Ht 74.0 in | Wt 219.0 lb

## 2020-01-01 DIAGNOSIS — Z720 Tobacco use: Secondary | ICD-10-CM

## 2020-01-01 DIAGNOSIS — G629 Polyneuropathy, unspecified: Secondary | ICD-10-CM

## 2020-01-01 DIAGNOSIS — J3089 Other allergic rhinitis: Secondary | ICD-10-CM

## 2020-01-01 DIAGNOSIS — J441 Chronic obstructive pulmonary disease with (acute) exacerbation: Secondary | ICD-10-CM

## 2020-01-01 DIAGNOSIS — I5042 Chronic combined systolic (congestive) and diastolic (congestive) heart failure: Secondary | ICD-10-CM

## 2020-01-01 DIAGNOSIS — G8929 Other chronic pain: Secondary | ICD-10-CM

## 2020-01-01 DIAGNOSIS — M545 Low back pain, unspecified: Secondary | ICD-10-CM | POA: Diagnosis not present

## 2020-01-01 DIAGNOSIS — Z23 Encounter for immunization: Secondary | ICD-10-CM

## 2020-01-01 DIAGNOSIS — G729 Myopathy, unspecified: Secondary | ICD-10-CM

## 2020-01-01 DIAGNOSIS — Z86711 Personal history of pulmonary embolism: Secondary | ICD-10-CM

## 2020-01-01 MED ORDER — TRELEGY ELLIPTA 100-62.5-25 MCG/INH IN AEPB: 1.0000 | INHALATION_SPRAY | Freq: Every day | RESPIRATORY_TRACT | 6 refills | Status: DC

## 2020-01-01 MED ORDER — FLUTICASONE PROPIONATE 50 MCG/ACT NA SUSP: 2.0000 | Freq: Two times a day (BID) | NASAL | 2 refills | Status: DC

## 2020-01-01 MED ORDER — APIXABAN 5 MG PO TABS: 5.0000 mg | ORAL_TABLET | Freq: Two times a day (BID) | ORAL | 6 refills | Status: DC

## 2020-01-01 MED ORDER — FUROSEMIDE 40 MG PO TABS: 40.0000 mg | ORAL_TABLET | Freq: Every day | ORAL | 6 refills | Status: DC

## 2020-01-01 MED ORDER — TIZANIDINE HCL 4 MG PO TABS: 4.0000 mg | ORAL_TABLET | Freq: Three times a day (TID) | ORAL | 2 refills | Status: DC | PRN

## 2020-01-01 MED ORDER — CETIRIZINE HCL 10 MG PO TABS: 10.0000 mg | ORAL_TABLET | Freq: Every day | ORAL | 3 refills | Status: DC

## 2020-01-01 MED FILL — ELIQUIS 5 MG TABLET: 5 | 30 days supply | Qty: 60 | Fill #0

## 2020-01-01 MED FILL — TRELEGY ELLIPTA 100-62.5-25: 100-62.5-25 | 30 days supply | Qty: 60 | Fill #0

## 2020-01-01 MED FILL — FUROSEMIDE 40 MG TAB: 40 | 30 days supply | Qty: 30 | Fill #0

## 2020-01-01 MED FILL — tiZANidine HCL 4 MG TABS: 4 | 20 days supply | Qty: 60 | Fill #0

## 2020-01-01 MED FILL — FLUTICASONE PROP 50 MCG SPR: 50 | 30 days supply | Qty: 16 | Fill #0

## 2020-01-01 NOTE — Patient Instructions (Signed)
Myopathy Myopathy is a condition that causes muscles to be weak and not work well. There are many different kinds of myopathies. Myopathies may be passed from parent to child (inherited), or they may be caused by external factors (acquired). Inherited myopathies may cause symptoms at birth or early in life. Acquired myopathies may start suddenly at any age. There is no cure for most myopathies. What are the causes? Common causes of myopathy include:  Endocrine disorders, such as thyroid disease.  Metabolic disorders, which are usually inherited.  Infection or inflammation of the muscles. This is often triggered by viruses or because the body's defense system (immune system) is attacking the muscles.  Certain medicines, such as lipid-lowering medicines. In some cases, the cause is not known. What increases the risk? You are more likely to develop this condition if you:  Have a family history of myopathy.  Are male. What are the signs or symptoms? Symptoms of myopathy can range from mild to severe. Common symptoms of this condition include:  Muscle weakness.  Cramps.  Stiffness.  Spasms. These symptoms are usually felt close to the center of the body (proximal). Depending upon the type of myopathy, one muscle group may be more affected than others. In inherited myopathies, symptoms vary among family members. Other symptoms of myopathy include:  Muscle pain or tenderness.  Muscle weakness that gets progressively worse.  Fatigue.  Heart problems.  Trouble breathing.  Trouble swallowing. How is this diagnosed? This condition is diagnosed based on your medical history and tests, which may include:  Blood tests.  Removal of a small piece of muscle tissue to be tested (biopsy).  Electromyogram (EMG).  MRI.  Electrocardiogram (ECG).  Genetic testing. How is this treated? Treatment for this condition depends on the type of myopathy. Treatment may  include:  Over-the-counter medicines such as acetaminophen or ibuprofen.  Prescription medicines, such as disease-modifying antirheumatic drugs (DMARDs) or drugs that suppress the immune system.  Physical therapy.  A brace to help stabilize your muscles. Follow these instructions at home: If you have a brace:  Wear the brace as told by your health care provider. Remove it only as told by your health care provider.  Loosen the brace if any part of your body tingles, becomes numb, or turns cold and blue.  Keep the brace clean.  Check the skin around it every day. Tell your health care provider about any concerns.  If the brace is not waterproof: ? Do not let it get wet. ? Cover it with a watertight covering when you take a bath or shower.  Ask your health care provider when it is safe to drive if you are wearing a brace. General instructions  Take over-the-counter and prescription medicines only as told by your health care provider.  Maintain a healthy weight. Follow instructions from your health care provider about eating, drinking, and physical activity.  If physical therapy is prescribed, do exercises as told by your health care provider or physical therapist.  Keep all follow-up visits as told by your health care provider. This is important. Contact a health care provider if you have:  Trouble managing your symptoms at home.  A fever. Get help right away if you develop:  Breathing problems.  Chest pain. Summary  Myopathy is a condition that causes muscles to be weak and not work well.  There is no cure for most myopathies.  This condition may be treated with medicines, physical therapy, and a brace to help stabilize your  muscles. This information is not intended to replace advice given to you by your health care provider. Make sure you discuss any questions you have with your health care provider. Document Revised: 12/04/2017 Document Reviewed:  12/04/2017 Elsevier Patient Education  [place].

## 2020-01-01 NOTE — Progress Notes (Signed)
Having pain in legs.  Wants to discuss booster.

## 2020-01-01 NOTE — Progress Notes (Signed)
Subjective:  Patient ID: [name], male    DOB: 1961/02/18  Age: 59 y.o. MRN: [id number]  CC: COPD   HPI [name] is a 59 year old male with a history of COPD, chronic sinusitis, recurrent DVT and PE (on anticoagulation with Eliquis), coronary artery disease (status post stent, last cardiac cath from 12/2014 revealed two-vessel CAD, medical management recommended), (EF 40-45% from 11/2017),tobacco abuse who presents today for follow-up visit.   He is unable to climb stairs and walk up hills as his legs hurt. He was taken off his statin by his Vascular surgeon for possible statin myopathy. He is unable to get up from a low chair. Feels like he has no bones in his legs.  He has no back pain. Saw Neurosurgery in 07/2019 [facility] to exclude spinal stenosis but per patient he was informed he had nothing wrong with his back. Had also seen vein and vascular early in the year and thought to have neurogenic rather than intermittent claudication.  He suffers from non seasonal allergies and sneezes a lot.  Uses Flonase intermittently. Visit with cardiology was last month and cardiology notes recommend starting PCSK9 inhibitor due to statin intolerance.  He has no chest pain or dyspnea. Denies COPD flares but would like to switch from his current inhaler to one of the newer inhalers. He has not had 1 cig in 6 days.  Past Medical History:  Diagnosis Date   Asthma    CAD (coronary artery disease)    a. 09/2013 NSTEMI/PCI: LM nl, LAD 40-50%, D1 100 (2.25 x 28 Vision BMS), LCX min irregs, RI 60-70, 30, RCA 40-50/50-34ms/p, EF 45-50%.  b. cath 12/2014 -occulded BMS in diag, 50% pro to mid LAD, 60% ramus, 30% RCA    Chest pain 08/2015   Chronic diastolic CHF (congestive heart failure) ([place])    a. 09/2014 EF 45-50% by LV gram;  b. 01/2014 Echo: EF 55-60%, Gr 1 DD.   Cocaine abuse ([place])    COPD (chronic obstructive pulmonary disease) ([place])    DVT (deep venous  thrombosis) ([place])    "I had ~ 10 in each leg" (12/20/2016)   Essential hypertension    Hepatitis C    "clear free over a year now"   High cholesterol    Myocardial infarction [facility]) ~ 2014   Osteoarthritis    a. hands and toes.   Pneumonia    "several times" (12/20/2016)   Pulmonary embolism ([place])    a. 2012 - s/p IVC filter;  b. prev on eliquis - noncompliant.   Shortness of breath dyspnea    Sinus headache    Squamous cell skin cancer 07/13/2016   "foot"   Tobacco abuse     Past Surgical History:  Procedure Laterality Date   CARDIAC CATHETERIZATION N/A 01/04/2015   Procedure: Left Heart Cath and Coronary Angiography;  Surgeon: Burnell Blanks, MD;  Location: [place] [facility];  Service: Cardiovascular;  Laterality: N/A;   CORONARY ANGIOPLASTY WITH STENT PLACEMENT  10/21/13   BMS to D1   CYST EXCISION N/A 05/10/2016   Procedure: EXCISION OF SEBACEOUS CYST UPPER BACK;  Surgeon: Donnie Mesa, MD;  Location: [place];  Service: General;  Laterality: N/A;   FOOT SURGERY Right ~ 06/2016   "said there was skin cancer on it"   LEFT HEART CATHETERIZATION WITH CORONARY ANGIOGRAM N/A 10/21/2013   Procedure: LEFT HEART CATHETERIZATION WITH CORONARY ANGIOGRAM;  Surgeon: Leonie Man, MD;  Location: [facility]  CATH LAB;  Service: Cardiovascular;  Laterality: N/A;   VENA CAVA FILTER PLACEMENT  ~ 2007-~ 2017   "1 in my neck; 2 in my wrist"    Family History  Problem Relation Age of Onset   Asthma Mother    Allergies Mother    Allergies Sister    Allergies Brother    Deep vein thrombosis Brother        two brothers with recurrent DVT   Heart attack Father        a.60s b. deceased in his 3s   Coronary artery disease Father    Colon cancer Neg Hx    Liver cancer Neg Hx    Esophageal cancer Neg Hx    Colon polyps Neg Hx    Rectal cancer Neg Hx    Pancreatic cancer Neg Hx    Stomach cancer Neg Hx     No Known Allergies  Outpatient Medications Prior to  Visit  Medication Sig Dispense Refill   albuterol (PROAIR HFA) 108 (90 Base) MCG/ACT inhaler Inhale 2 puffs into the lungs every 6 (six) hours as needed for wheezing or shortness of breath. 18 g 6   albuterol (PROVENTIL) (2.5 MG/3ML) 0.083% nebulizer solution Take 3 mLs (2.5 mg total) by nebulization every 6 (six) hours as needed for wheezing or shortness of breath. 75 mL 3   gabapentin (NEURONTIN) 300 MG capsule Take 1 capsule (300 mg total) by mouth 2 (two) times daily. (Patient taking differently: Take 300-600 mg by mouth at bedtime as needed (pain). ) 60 capsule 6   isosorbide mononitrate (IMDUR) 30 MG 24 hr tablet Take 1 tablet (30 mg total) by mouth daily. 30 tablet 1   metoprolol succinate (TOPROL XL) 25 MG 24 hr tablet Take 1 tablet (25 mg total) by mouth daily. 90 tablet 3   pantoprazole (PROTONIX) 40 MG tablet Take 40 mg by mouth daily.     tamsulosin (FLOMAX) 0.4 MG CAPS capsule Take 1 capsule (0.4 mg total) by mouth daily. (Patient taking differently: Take 0.4 mg by mouth daily. As Needed) 30 capsule 0   vitamin B-12 (CYANOCOBALAMIN) 1000 MCG tablet Take 1 tablet (1,000 mcg total) by mouth daily. 30 tablet 5   apixaban (ELIQUIS) 5 MG TABS tablet Take 1 tablet (5 mg total) by mouth 2 (two) times daily. 60 tablet 6   budesonide-formoterol (SYMBICORT) 160-4.5 MCG/ACT inhaler Inhale 2 puffs into the lungs 2 (two) times daily. 1 Inhaler 6   cetirizine (ZYRTEC) 10 MG tablet Take 1 tablet (10 mg total) by mouth daily. 30 tablet 1   fluticasone (FLONASE) 50 MCG/ACT nasal spray PLACE 2 SPRAYS INTO BOTH NOSTRILS 2 (TWO) TIMES DAILY. (Patient taking differently: Place 2 sprays into both nostrils 2 (two) times daily as needed for allergies or rhinitis. ) 16 g 2   furosemide (LASIX) 40 MG tablet Take 1 tablet (40 mg total) by mouth daily. (Patient taking differently: Take 40 mg by mouth daily as needed for fluid or edema. ) 30 tablet 6   tiZANidine (ZANAFLEX) 4 MG tablet Take 1 tablet (4  mg total) by mouth every 8 (eight) hours as needed for muscle spasms. (Patient taking differently: Take 4 mg by mouth See admin instructions. Take one tablet (4 mg) by mouth every other night Patient taking as needed) 60 tablet 2   umeclidinium bromide (INCRUSE ELLIPTA) 62.5 MCG/INH AEPB Inhale 1 puff into the lungs daily. 1 each 6   nitroGLYCERIN (NITROSTAT) 0.4 MG SL tablet Place 1 tablet (  0.4 mg total) under the tongue every 5 (five) minutes as needed for chest pain. 25 tablet 3   guaiFENesin-codeine 100-10 MG/5ML syrup Take 10 mLs by mouth every 4 (four) hours as needed for cough. (Patient not taking: Reported on 12/15/2019) 120 mL 0   No facility-administered medications prior to visit.     ROS Review of Systems  Constitutional: Negative for activity change and appetite change.  HENT: Negative for sinus pressure and sore throat.   Eyes: Negative for visual disturbance.  Respiratory: Negative for cough, chest tightness and shortness of breath.   Cardiovascular: Negative for chest pain and leg swelling.  Gastrointestinal: Negative for abdominal distention, abdominal pain, constipation and diarrhea.  Endocrine: Negative.   Genitourinary: Negative for dysuria.  Musculoskeletal: Positive for myalgias.  Skin: Negative for rash.  Allergic/Immunologic: Negative.   Neurological: Positive for weakness. Negative for light-headedness and numbness.  Psychiatric/Behavioral: Negative for dysphoric mood and suicidal ideas.    Objective:  BP 94/63    Pulse 69    Ht 6\' 2"  (1.88 m)    Wt 219 lb (99.3 kg)    SpO2 100%    BMI 28.12 kg/m   BP/Weight 01/01/2020 12/15/2019 0/73/7106  Systolic BP 94 269 485  Diastolic BP 63 462 76  Wt. (Lbs) 219 216.4 -  BMI 28.12 27.78 -      Physical Exam Constitutional:      Appearance: He is well-developed.  Neck:     Vascular: No JVD.  Cardiovascular:     Rate and Rhythm: Normal rate.     Heart sounds: Normal heart sounds. No murmur heard.    Pulmonary:     Effort: Pulmonary effort is normal.     Breath sounds: Normal breath sounds. No wheezing or rales.  Chest:     Chest wall: No tenderness.  Abdominal:     General: Bowel sounds are normal. There is no distension.     Palpations: Abdomen is soft. There is no mass.     Tenderness: There is no abdominal tenderness.  Musculoskeletal:     Right lower leg: No edema.     Left lower leg: No edema.     Comments: In the supine position unable to elevate bilateral lower extremities actively above the horizontal plane. With flexed lower extremities, active elevation of bilateral lower extremity possible with knees at 90 degrees.  Neurological:     Mental Status: He is alert and oriented to person, place, and time.     Gait: Gait abnormal (holds unto support while rising from seated position).  Psychiatric:        Mood and Affect: Mood normal.     CMP Latest Ref Rng & Units 09/17/2019 09/16/2019 09/15/2019  Glucose 70 - 99 mg/dL 95 120(H) 151(H)  BUN 6 - 20 mg/dL 20 23(H) 21(H)  Creatinine 0.61 - 1.24 mg/dL 0.70 0.82 1.00  Sodium 135 - 145 mmol/L 139 139 137  Potassium 3.5 - 5.1 mmol/L 4.2 4.3 4.2  Chloride 98 - 111 mmol/L 108 108 107  CO2 22 - 32 mmol/L 24 23 22   Calcium 8.9 - 10.3 mg/dL 8.6(L) 8.8(L) 8.5(L)  Total Protein 6.0 - 8.5 g/dL - - -  Total Bilirubin 0.0 - 1.2 mg/dL - - -  Alkaline Phos 39 - 117 IU/L - - -  AST 0 - 40 IU/L - - -  ALT 0 - 44 IU/L - - -    Lipid Panel     Component Value Date/Time  CHOL 145 01/21/2019 0948   TRIG 87 01/21/2019 0948   HDL 57 01/21/2019 0948   CHOLHDL 2.5 01/21/2019 0948   CHOLHDL 3.1 01/01/2015 0208   VLDL 12 01/01/2015 0208   LDLCALC 72 01/21/2019 0948    CBC    Component Value Date/Time   WBC 7.6 09/17/2019 0646   RBC 3.43 (L) 09/17/2019 0646   HGB 10.3 (L) 09/17/2019 0646   HGB 13.0 06/09/2019 0926   HCT 32.6 (L) 09/17/2019 0646   HCT 38.4 06/09/2019 0926   PLT 165 09/17/2019 0646   PLT 212 06/09/2019 0926    MCV 95.0 09/17/2019 0646   MCV 93 06/09/2019 0926   MCH 30.0 09/17/2019 0646   MCHC 31.6 09/17/2019 0646   RDW 14.6 09/17/2019 0646   RDW 13.0 06/09/2019 0926   LYMPHSABS 1.1 09/14/2019 0707   LYMPHSABS 1.4 08/03/2016 1244   MONOABS 0.3 09/14/2019 0707   EOSABS 0.0 09/14/2019 0707   EOSABS 0.3 08/03/2016 1244   BASOSABS 0.0 09/14/2019 0707   BASOSABS 0.0 08/03/2016 1244    Lab Results  Component Value Date   HGBA1C 5.4 05/16/2016    Assessment & Plan:  1. COPD exacerbation (HCC) Stable Switch from Symbicort and Incruse to Trelegy per patient request Advised that smoking cessation will be beneficial - Fluticasone-Umeclidin-Vilant (TRELEGY ELLIPTA) 100-62.5-25 MCG/INH AEPB; Inhale 1 Inhaler into the lungs daily.  Dispense: 1 each; Refill: 6  2. Chronic bilateral low back pain without sciatica Uses this intermittently - tiZANidine (ZANAFLEX) 4 MG tablet; Take 1 tablet (4 mg total) by mouth every 8 (eight) hours as needed for muscle spasms.  Dispense: 60 tablet; Refill: 2  3. Chronic combined systolic and diastolic congestive heart failure (HCC) EF 40 to 45% from echo of 2019 Euvolemic - furosemide (LASIX) 40 MG tablet; Take 1 tablet (40 mg total) by mouth daily.  Dispense: 30 tablet; Refill: 6  4. History of pulmonary embolism On chronic anticoagulation with Eliquis - apixaban (ELIQUIS) 5 MG TABS tablet; Take 1 tablet (5 mg total) by mouth 2 (two) times daily.  Dispense: 60 tablet; Refill: 6  5. Neuropathy Uncontrolled with neurogenic claudication Seen by neurosurgery no additional recommendations Currently on gabapentin - Ambulatory referral to Neurology  6. Tobacco abuse Spent 3 minutes counseling on smoking cessation and detrimental effects of smoking-he is working on quitting  7. Myopathy Unknown etiology of myopathy It does appear he might have had EMG studies with neuro surgery but records are not available at this time We will refer to neurology for further  evaluation - Ambulatory referral to Neurology  8. Non-seasonal allergic rhinitis due to other allergic trigger - fluticasone (FLONASE) 50 MCG/ACT nasal spray; Place 2 sprays into both nostrils 2 (two) times daily.  Dispense: 16 g; Refill: 2 - cetirizine (ZYRTEC) 10 MG tablet; Take 1 tablet (10 mg total) by mouth daily.  Dispense: 30 tablet; Refill: 3  9. Need for immunization against influenza - Flu Vaccine QUAD 36+ mos IM   Meds ordered this encounter  Medications   tiZANidine (ZANAFLEX) 4 MG tablet    Sig: Take 1 tablet (4 mg total) by mouth every 8 (eight) hours as needed for muscle spasms.    Dispense:  60 tablet    Refill:  2   Fluticasone-Umeclidin-Vilant (TRELEGY ELLIPTA) 100-62.5-25 MCG/INH AEPB    Sig: Inhale 1 Inhaler into the lungs daily.    Dispense:  1 each    Refill:  6    Discontinue Symbicort, discontinue Incruse  fluticasone (FLONASE) 50 MCG/ACT nasal spray    Sig: Place 2 sprays into both nostrils 2 (two) times daily.    Dispense:  16 g    Refill:  2   furosemide (LASIX) 40 MG tablet    Sig: Take 1 tablet (40 mg total) by mouth daily.    Dispense:  30 tablet    Refill:  6   apixaban (ELIQUIS) 5 MG TABS tablet    Sig: Take 1 tablet (5 mg total) by mouth 2 (two) times daily.    Dispense:  60 tablet    Refill:  6   cetirizine (ZYRTEC) 10 MG tablet    Sig: Take 1 tablet (10 mg total) by mouth daily.    Dispense:  30 tablet    Refill:  3    Follow-up: Return in about 3 months (around 04/02/2020) for Chronic disease management.       Charlott Rakes, MD, FAAFP. [facility] [place], [place]   01/01/2020, 1:00 PM

## 2020-01-05 ENCOUNTER — Ambulatory Visit: Payer: Medicare Other

## 2020-01-09 DIAGNOSIS — R29898 Other symptoms and signs involving the musculoskeletal system: Secondary | ICD-10-CM | POA: Diagnosis not present

## 2020-01-09 DIAGNOSIS — Z008 Encounter for other general examination: Secondary | ICD-10-CM | POA: Diagnosis not present

## 2020-01-09 DIAGNOSIS — I6529 Occlusion and stenosis of unspecified carotid artery: Secondary | ICD-10-CM | POA: Diagnosis not present

## 2020-01-09 DIAGNOSIS — I2510 Atherosclerotic heart disease of native coronary artery without angina pectoris: Secondary | ICD-10-CM | POA: Diagnosis not present

## 2020-01-26 ENCOUNTER — Other Ambulatory Visit: Payer: Self-pay

## 2020-01-26 ENCOUNTER — Ambulatory Visit (INDEPENDENT_AMBULATORY_CARE_PROVIDER_SITE_OTHER): Payer: Medicare Other | Admitting: Pharmacist Clinician (PhC)/ Clinical Pharmacy Specialist

## 2020-01-26 ENCOUNTER — Other Ambulatory Visit: Payer: Self-pay | Admitting: Internal Medicine

## 2020-01-26 ENCOUNTER — Telehealth: Payer: Self-pay | Admitting: Pharmacist Clinician (PhC)/ Clinical Pharmacy Specialist

## 2020-01-26 DIAGNOSIS — E785 Hyperlipidemia, unspecified: Secondary | ICD-10-CM

## 2020-01-26 DIAGNOSIS — I208 Other forms of angina pectoris: Secondary | ICD-10-CM

## 2020-01-26 MED ORDER — ROSUVASTATIN CALCIUM 5 MG PO TABS
5.0000 mg | ORAL_TABLET | Freq: Every day | ORAL | 6 refills | Status: DC
Start: 2020-01-26 — End: 2022-06-14

## 2020-01-26 NOTE — Telephone Encounter (Signed)
Patient was scheduled for CVRR appointment today to discuss potential PCSK-9i start.  Had been on atorvastatin 40 mg for several years, until recently realized that his leg pain was caused by medication.  Stopped about 3 months ago and pain has since resolved.    Patient has not challenged with another statin drug in the past.  Also he noted that his managed Medicare is thru [facility]  Currently all plans require trial and failure of 2 statin drugs before allowing PCSK-9 inhibitor use.  Also note that [facility] is only allowing Repatha use for patients with familial hyperlipidemia.  (we have another patient with this plan and have learned their criteria).    Because of this, will not see the patient in the office but rather have him repeat lipid labs today and start him on rosuvastatin 5 mg daily.  He knows to call into the office if this causes his leg pains to return.

## 2020-01-27 LAB — LIPID PANEL
Chol/HDL Ratio: 4.4 ratio (ref 0.0–5.0)
Cholesterol, Total: 231 mg/dL — ABNORMAL HIGH (ref 100–199)
HDL: 53 mg/dL (ref >39)
LDL Chol Calc (NIH): 162 mg/dL — ABNORMAL HIGH (ref 0–99)
Triglycerides: 90 mg/dL (ref 0–149)
VLDL Cholesterol Cal: 16 mg/dL (ref 5–40)

## 2020-01-27 MED FILL — ROSUVASTATIN CALCIUM 5 MG T: 5 | 30 days supply | Qty: 30 | Fill #0

## 2020-01-29 ENCOUNTER — Telehealth: Payer: Self-pay | Admitting: Internal Medicine

## 2020-01-29 ENCOUNTER — Other Ambulatory Visit: Payer: Self-pay | Admitting: Family Medicine

## 2020-01-29 MED FILL — TRELEGY ELLIPTA 100-62.5-25: 100-62.5-25 | 30 days supply | Qty: 60 | Fill #1

## 2020-01-29 MED FILL — ALBUTEROL SULFATE HFA 108 (: 108 (90 BAS | 16 days supply | Qty: 9 | Fill #2

## 2020-01-29 MED FILL — FLUTICASONE PROP 50 MCG SPR: 50 | 30 days supply | Qty: 16 | Fill #1

## 2020-01-29 NOTE — Telephone Encounter (Signed)
Spoke with patient. Results reviewed, patient verbalized understanding.

## 2020-01-29 NOTE — Telephone Encounter (Signed)
Requested medication (s) are due for refill today:  Yes  Requested medication (s) are on the active medication list:  Yes  Future visit scheduled:  Yes  Last Refill: Historical   Notes to clinic:  Historical provider  Requested Prescriptions  Pending Prescriptions Disp Refills   pantoprazole (PROTONIX) 40 MG tablet [Pharmacy Med Name: PANTOPRAZOLE SOD DR 40 MG T 40 Tablet] 30 tablet 3    Sig: TAKE 1 TABLET (40 MG TOTAL) BY MOUTH DAILY.      Gastroenterology: Proton Pump Inhibitors Passed - 01/29/2020 10:29 AM      Passed - Valid encounter within last 12 months    Recent Outpatient Visits           4 weeks ago Tobacco abuse   [place], [address], MD   6 months ago Chronic bilateral low back pain without sciatica   [place], Enobong, MD   7 months ago Urinary hesitancy   [place], Levada Dy M, [place]   9 months ago Other fatigue   [place], Enobong, MD   1 year ago History of pulmonary embolism   [place] Charlott Rakes, MD       Future Appointments             In 2 weeks Elouise Munroe, MD [facility] [place], [facility]   In 2 months Charlott Rakes, MD [place]

## 2020-01-29 NOTE — Telephone Encounter (Signed)
Pt called in returning Jenna's call about his lab work   Best number [phone]

## 2020-01-30 ENCOUNTER — Other Ambulatory Visit: Payer: Self-pay | Admitting: Family Medicine

## 2020-01-30 ENCOUNTER — Ambulatory Visit (INDEPENDENT_AMBULATORY_CARE_PROVIDER_SITE_OTHER): Payer: Medicare Other | Admitting: Podiatry

## 2020-01-30 ENCOUNTER — Other Ambulatory Visit: Payer: Self-pay

## 2020-01-30 ENCOUNTER — Encounter: Payer: Self-pay | Admitting: Podiatry

## 2020-01-30 DIAGNOSIS — B351 Tinea unguium: Secondary | ICD-10-CM | POA: Diagnosis not present

## 2020-01-30 DIAGNOSIS — M79675 Pain in left toe(s): Secondary | ICD-10-CM

## 2020-01-30 DIAGNOSIS — Z7901 Long term (current) use of anticoagulants: Secondary | ICD-10-CM

## 2020-01-30 DIAGNOSIS — M79674 Pain in right toe(s): Secondary | ICD-10-CM

## 2020-01-30 MED FILL — PANTOPRAZOLE SOD DR 40 MG T: 40 | 30 days supply | Qty: 30 | Fill #0

## 2020-01-30 NOTE — Progress Notes (Signed)
This patient returns to my office for at risk foot care.  This patient requires this care by a professional since this patient will be at risk due to having coagulation defect. Patient is taking eliquis.  This patient is unable to cut nails himself since the patient cannot reach his nails.These nails are painful walking and wearing shoes.  This patient presents for at risk foot care today.  General Appearance  Alert, conversant and in no acute stress.  Vascular  Dorsalis pedis and posterior tibial  pulses are palpable  bilaterally.  Capillary return is within normal limits  bilaterally. Temperature is within normal limits  bilaterally.  Neurologic  Senn-Weinstein monofilament wire test within normal limits  bilaterally. Muscle power within normal limits bilaterally.  Nails Thick disfigured discolored nails with subungual debris  from hallux to fifth toes bilaterally. No evidence of bacterial infection or drainage bilaterally.  Orthopedic  No limitations of motion  feet .  No crepitus or effusions noted.  No bony pathology or digital deformities noted.  Skin  normotropic skin with no porokeratosis noted bilaterally.  No signs of infections or ulcers noted.     Onychomycosis  Pain in right toes  Pain in left toes  Consent was obtained for treatment procedures.   Mechanical debridement of nails 1-5  bilaterally performed with a nail nipper.  Filed with dremel without incident.    Return office visit    4   months                  Told patient to return for periodic foot care and evaluation due to potential at risk complications.   Kennidy Lamke DPM  

## 2020-02-02 MED FILL — SYMBICORT 160-4.5 MCG INH: 160-4.5 | 30 days supply | Qty: 10 | Fill #2

## 2020-02-05 ENCOUNTER — Other Ambulatory Visit: Payer: Self-pay | Admitting: Family Medicine

## 2020-02-05 ENCOUNTER — Ambulatory Visit (INDEPENDENT_AMBULATORY_CARE_PROVIDER_SITE_OTHER): Payer: Medicare Other

## 2020-02-05 ENCOUNTER — Other Ambulatory Visit: Payer: Self-pay

## 2020-02-05 ENCOUNTER — Encounter (HOSPITAL_COMMUNITY): Payer: Self-pay

## 2020-02-05 ENCOUNTER — Ambulatory Visit (HOSPITAL_COMMUNITY)
Admission: EM | Admit: 2020-02-05 | Discharge: 2020-02-05 | Disposition: A | Discharge status: Home / Self Care | Payer: Medicare Other | Attending: Family Medicine | Admitting: Family Medicine

## 2020-02-05 DIAGNOSIS — M549 Dorsalgia, unspecified: Secondary | ICD-10-CM | POA: Diagnosis not present

## 2020-02-05 DIAGNOSIS — M5442 Lumbago with sciatica, left side: Secondary | ICD-10-CM | POA: Diagnosis not present

## 2020-02-05 DIAGNOSIS — W19XXXA Unspecified fall, initial encounter: Secondary | ICD-10-CM

## 2020-02-05 DIAGNOSIS — M545 Low back pain, unspecified: Secondary | ICD-10-CM | POA: Diagnosis not present

## 2020-02-05 MED ORDER — PREDNISONE 10 MG (21) PO TBPK: ORAL_TABLET | ORAL | 0 refills | Status: DC

## 2020-02-05 MED ORDER — HYDROCODONE-ACETAMINOPHEN 5-325 MG PO TABS
1.0000 | ORAL_TABLET | Freq: Four times a day (QID) | ORAL | 0 refills | Status: DC | PRN
Start: 2020-02-05 — End: 2020-11-03

## 2020-02-05 MED FILL — predniSONE 10 MG TABS: 10 | 21 days supply | Qty: 21 | Fill #0

## 2020-02-05 NOTE — ED Provider Notes (Signed)
[place]    CSN: [id number] Arrival date & time: 02/05/20  0800      History   Chief Complaint Chief Complaint  Patient presents with  . Fall  . Back Pain    HPI [name] is a 59 y.o. male.   Pt is a 59 year old male that presents with lower back pain, more on the left lower back. Started after a fall on the bus. Hit his back on the stair. Describes the pain as sharp and stabbing. Pain radiates down the left leg. Taking Zanaflex without much relief. No fever, chills, urinary symptoms. Hx of chronic back issues.      Past Medical History:  Diagnosis Date  . Asthma   . CAD (coronary artery disease)    a. 09/2013 NSTEMI/PCI: LM nl, LAD 40-50%, D1 100 (2.25 x 28 Vision BMS), LCX min irregs, RI 60-70, 30, RCA 40-50/50-46ms/p, EF 45-50%.  b. cath 12/2014 -occulded BMS in diag, 50% pro to mid LAD, 60% ramus, 30% RCA   . Chest pain 08/2015  . Chronic diastolic CHF (congestive heart failure) ([place])    a. 09/2014 EF 45-50% by LV gram;  b. 01/2014 Echo: EF 55-60%, Gr 1 DD.  Marland Kitchen Cocaine abuse ([place])   . COPD (chronic obstructive pulmonary disease) ([place])   . DVT (deep venous thrombosis) ([place])    "I had ~ 10 in each leg" (12/20/2016)  . Essential hypertension   . Hepatitis C    "clear free over a year now"  . High cholesterol   . Myocardial infarction ([place]) ~ 2014  . Osteoarthritis    a. hands and toes.  . Pneumonia    "several times" (12/20/2016)  . Pulmonary embolism ([place])    a. 2012 - s/p IVC filter;  b. prev on eliquis - noncompliant.  . Shortness of breath dyspnea   . Sinus headache   . Squamous cell skin cancer 07/13/2016   "foot"  . Tobacco abuse     Patient Active Problem List   Diagnosis Date Noted  . Pain due to onychomycosis of toenails of both feet 01/30/2020  . Hyperlipidemia 01/26/2020  . GERD (gastroesophageal reflux disease) 04/24/2017  . COPD exacerbation ([place]) 04/05/2017  . Chronic sinusitis 02/02/2017  . COPD with acute exacerbation  ([place]) 12/20/2016  . History of squamous cell carcinoma 08/03/2016  . Psoriasis 08/03/2016  . Squamous cell skin cancer 07/13/2016  . Chronic anticoagulation 02/01/2016  . Malnutrition of moderate degree 06/05/2015  . Liver fibrosis 04/08/2015  . COPD (chronic obstructive pulmonary disease) ([place]) 01/01/2015  . Essential hypertension 01/01/2015  . Unstable angina ([place]) 01/01/2015  . History of coronary artery disease   . Cocaine abuse ([place]) 12/01/2014  . Pleuritic chest pain 12/01/2014  . Chronic diastolic heart failure ([place]) 12/01/2014  . QT prolongation 12/01/2014  . Chronic hepatitis C without hepatic coma ([place]) 06/18/2014  . HTN (hypertension) 04/27/2014  . Coronary artery disease involving native coronary artery of native heart without angina pectoris   . CAD (coronary artery disease) 10/21/2013  . NSTEMI (non-ST elevated myocardial infarction) ([place]) 10/21/2013  . Anemia 06/20/2013  . Regular alcohol consumption 06/18/2013  . History of DVT (deep vein thrombosis) 02/21/2013  . Thrombocytopenia ([place]) 08/09/2012  . Tobacco abuse 08/09/2012  . History of noncompliance with medical treatment 03/24/2012  . Atopic dermatitis 03/24/2012  . History of pulmonary embolism 02/10/2012    Past Surgical History:  Procedure Laterality Date  . CARDIAC CATHETERIZATION N/A 01/04/2015  Procedure: Left Heart Cath and Coronary Angiography;  Surgeon: Burnell Blanks, MD;  Location: [facility];  Service: Cardiovascular;  Laterality: N/A;  . CORONARY ANGIOPLASTY WITH STENT PLACEMENT  10/21/13   BMS to D1  . CYST EXCISION N/A 05/10/2016   Procedure: EXCISION OF SEBACEOUS CYST UPPER BACK;  Surgeon: Donnie Mesa, MD;  Location: [facility];  Service: General;  Laterality: N/A;  . FOOT SURGERY Right ~ 06/2016   "said there was skin cancer on it"  . LEFT HEART CATHETERIZATION WITH CORONARY ANGIOGRAM N/A 10/21/2013   Procedure: LEFT HEART CATHETERIZATION WITH CORONARY ANGIOGRAM;  Surgeon: Leonie Man, MD;  Location: [facility] C[facility];  Service: Cardiovascular;  Laterality: N/A;  . VENA CAVA FILTER PLACEMENT  ~ 2007-~ 2017   "1 in my neck; 2 in my wrist"       Home Medications    Prior to Admission medications   Medication Sig Start Date End Date Taking? Authorizing Provider  albuterol (PROAIR HFA) 108 (90 Base) MCG/ACT inhaler Inhale 2 puffs into the lungs every 6 (six) hours as needed for wheezing or shortness of breath. 06/25/19   Argentina Donovan, PA-C  albuterol (PROVENTIL) (2.5 MG/3ML) 0.083% nebulizer solution Take 3 mLs (2.5 mg total) by nebulization every 6 (six) hours as needed for wheezing or shortness of breath. 04/21/19   Charlott Rakes, MD  apixaban (ELIQUIS) 5 MG TABS tablet Take 1 tablet (5 mg total) by mouth 2 (two) times daily. 01/01/20   Charlott Rakes, MD  cetirizine (ZYRTEC) 10 MG tablet Take 1 tablet (10 mg total) by mouth daily. 01/01/20   Charlott Rakes, MD  fluticasone (FLONASE) 50 MCG/ACT nasal spray Place 2 sprays into both nostrils 2 (two) times daily. 01/01/20   Charlott Rakes, MD  Fluticasone-Umeclidin-Vilant (TRELEGY ELLIPTA) 100-62.5-25 MCG/INH AEPB Inhale 1 Inhaler into the lungs daily. 01/01/20   Charlott Rakes, MD  furosemide (LASIX) 40 MG tablet Take 1 tablet (40 mg total) by mouth daily. 01/01/20   Charlott Rakes, MD  gabapentin (NEURONTIN) 300 MG capsule Take 1 capsule (300 mg total) by mouth 2 (two) times daily. Patient taking differently: Take 300-600 mg by mouth at bedtime as needed (pain).  04/21/19   Charlott Rakes, MD  HYDROcodone-acetaminophen (NORCO/VICODIN) 5-325 MG tablet Take 1-2 tablets by mouth every 6 (six) hours as needed. 02/05/20   Loura Halt A, NP  isosorbide mononitrate (IMDUR) 30 MG 24 hr tablet Take 1 tablet (30 mg total) by mouth daily. 12/15/19 01/25/21  Elouise Munroe, MD  metoprolol succinate (TOPROL XL) 25 MG 24 hr tablet Take 1 tablet (25 mg total) by mouth daily. 07/18/19   Elouise Munroe, MD  nitroGLYCERIN  (NITROSTAT) 0.4 MG SL tablet Place 1 tablet (0.4 mg total) under the tongue every 5 (five) minutes as needed for chest pain. 08/05/19 01/25/21  Deberah Pelton, NP  pantoprazole (PROTONIX) 40 MG tablet TAKE 1 TABLET (40 MG TOTAL) BY MOUTH DAILY. 01/30/20   Charlott Rakes, MD  predniSONE (STERAPRED UNI-PAK 21 TAB) 10 MG (21) TBPK tablet 6 tabs for 1 day, then 5 tabs for 1 das, then 4 tabs for 1 day, then 3 tabs for 1 day, 2 tabs for 1 day, then 1 tab for 1 day 02/05/20   Loura Halt A, NP  rosuvastatin (CRESTOR) 5 MG tablet Take 1 tablet (5 mg total) by mouth daily. 01/26/20 04/25/20  Elouise Munroe, MD  tamsulosin (FLOMAX) 0.4 MG CAPS capsule Take 1 capsule (0.4 mg total)  by mouth daily. Patient taking differently: Take 0.4 mg by mouth daily. As Needed 09/18/19   Maudie Mercury, MD  tiZANidine (ZANAFLEX) 4 MG tablet Take 1 tablet (4 mg total) by mouth every 8 (eight) hours as needed for muscle spasms. 01/01/20   Charlott Rakes, MD  vitamin B-12 (CYANOCOBALAMIN) 1000 MCG tablet Take 1 tablet (1,000 mcg total) by mouth daily. 10/23/19   Seawell, Laurell Roof, DO    Family History Family History  Problem Relation Age of Onset  . Asthma Mother   . Allergies Mother   . Allergies Sister   . Allergies Brother   . Deep vein thrombosis Brother        two brothers with recurrent DVT  . Heart attack Father        a.60s b. deceased in his 91s  . Coronary artery disease Father   . Colon cancer Neg Hx   . Liver cancer Neg Hx   . Esophageal cancer Neg Hx   . Colon polyps Neg Hx   . Rectal cancer Neg Hx   . Pancreatic cancer Neg Hx   . Stomach cancer Neg Hx     Social History Social History   Tobacco Use  . Smoking status: Current Every Day Smoker    Packs/day: 0.25    Years: 39.00    Pack years: 9.75    Types: Cigarettes, Cigars  . Smokeless tobacco: Never Used  Vaping Use  . Vaping Use: Never used  Substance Use Topics  . Alcohol use: Yes    Alcohol/week: 6.0 standard drinks    Types: 2  Cans of beer, 4 Shots of liquor per week  . Drug use: Not Currently    Types: Cocaine    Comment: 12/20/2016 "might have used some the other day; I'm not sure"     Allergies   Atorvastatin   Review of Systems Review of Systems   Physical Exam Triage Vital Signs ED Triage Vitals  Enc Vitals Group     BP 02/05/20 0824 123/88     Pulse Rate 02/05/20 0824 77     Resp 02/05/20 0824 20     Temp --      Temp src --      SpO2 02/05/20 0824 97 %     Weight --      Height --      Head Circumference --      Peak Flow --      Pain Score 02/05/20 0822 10     Pain Loc --      Pain Edu? --      Excl. in [place]? --    No data found.  Updated Vital Signs BP 123/88 (BP Location: Left Arm)   Pulse 77   Resp 20   SpO2 97%   Visual Acuity Right Eye Distance:   Left Eye Distance:   Bilateral Distance:    Right Eye Near:   Left Eye Near:    Bilateral Near:     Physical Exam Vitals and nursing note reviewed.  Constitutional:      Appearance: Normal appearance.  HENT:     Head: Normocephalic and atraumatic.     Nose: Nose normal.  Eyes:     Conjunctiva/sclera: Conjunctivae normal.  Pulmonary:     Effort: Pulmonary effort is normal.  Musculoskeletal:     Cervical back: Normal range of motion.     Lumbar back: Tenderness and bony tenderness present. Decreased range of motion. Positive left straight  leg raise test.       Back:  Skin:    General: Skin is warm and dry.  Neurological:     Mental Status: He is alert.  Psychiatric:        Mood and Affect: Mood normal.      UC Treatments / Results  Labs (all labs ordered are listed, but only abnormal results are displayed) Labs Reviewed - No data to display  EKG   Radiology DG Lumbar Spine Complete  Result Date: 02/05/2020 CLINICAL DATA:  59 year old male with a history of back pain. Prior fall EXAM: LUMBAR SPINE - COMPLETE 4+ VIEW COMPARISON:  08/06/2019 FINDINGS: Lumbar Spine: Lumbar vertebral elements maintain  normal alignment without evidence of anterolisthesis, retrolisthesis, subluxation. No acute fracture line identified. Vertebral body heights maintained. Mild disc space narrowing throughout the lumbar spine with early endplate changes. Early anterior osteophyte production. Oblique images demonstrate no displaced pars defect. Mild facet hypertrophy at L5-S1. Retrievable IVC filter.  Vascular calcifications IMPRESSION: Negative for acute fracture or malalignment of the lumbar spine. Electronically Signed   By: Corrie Mckusick D.O.   On: 02/05/2020 09:09    Procedures Procedures (including critical care time)  Medications Ordered in UC Medications - No data to display  Initial Impression / Assessment and Plan / UC Course  I have reviewed the triage vital signs and the nursing notes.  Pertinent labs & imaging results that were available during my care of the patient were reviewed by me and considered in my medical decision making (see chart for details).     Low back pain with left-sided sciatica. X-ray without any acute findings Is likely sciatic nerve inflammation.  Will treat with prednisone taper over the next 6 days.  Hydrocodone for severe pain as needed.  Muscle relaxer as needed Follow up as needed for continued or worsening symptoms  Final Clinical Impressions(s) / UC Diagnoses   Final diagnoses:  Acute left-sided low back pain with left-sided sciatica  Fall, initial encounter     Discharge Instructions     Your x-ray was normal.  This is most likely sciatic nerve inflammation We will treat with prednisone taper over the next 6 days.  Take this with food. Hydrocodone for severe pain as needed.  You can do your muscle relaxer as needed also. Follow up as needed for continued or worsening symptoms     ED Prescriptions    Medication Sig Dispense Auth. Provider   predniSONE (STERAPRED UNI-PAK 21 TAB) 10 MG (21) TBPK tablet 6 tabs for 1 day, then 5 tabs for 1 das, then 4 tabs  for 1 day, then 3 tabs for 1 day, 2 tabs for 1 day, then 1 tab for 1 day 21 tablet Ronia Hazelett A, NP   HYDROcodone-acetaminophen (NORCO/VICODIN) 5-325 MG tablet Take 1-2 tablets by mouth every 6 (six) hours as needed. 10 tablet Jermond Burkemper A, NP     I have reviewed the PDMP during this encounter.   Orvan July, NP 02/05/20 1035

## 2020-02-05 NOTE — ED Triage Notes (Signed)
Pt in with c/o fall and lower back pain that started Monday. States that he was getting off of the bus and fell backwards and hit something hard on his back  States that it feels like he is being stabbed in his back with neeedles  Has been taking muscle relaxer but states that it just makes him sleepy.  Denies head injury

## 2020-02-05 NOTE — Discharge Instructions (Signed)
Your x-ray was normal.  This is most likely sciatic nerve inflammation We will treat with prednisone taper over the next 6 days.  Take this with food. Hydrocodone for severe pain as needed.  You can do your muscle relaxer as needed also. Follow up as needed for continued or worsening symptoms

## 2020-02-16 ENCOUNTER — Encounter: Payer: Self-pay | Admitting: Internal Medicine

## 2020-02-16 ENCOUNTER — Other Ambulatory Visit: Payer: Self-pay

## 2020-02-16 ENCOUNTER — Telehealth: Payer: Self-pay

## 2020-02-16 ENCOUNTER — Ambulatory Visit (INDEPENDENT_AMBULATORY_CARE_PROVIDER_SITE_OTHER): Payer: Medicare Other | Admitting: Internal Medicine

## 2020-02-16 VITALS — BP 118/76 | HR 83 | Ht 74.0 in | Wt 218.6 lb

## 2020-02-16 DIAGNOSIS — E785 Hyperlipidemia, unspecified: Secondary | ICD-10-CM | POA: Diagnosis not present

## 2020-02-16 DIAGNOSIS — I208 Other forms of angina pectoris: Secondary | ICD-10-CM | POA: Diagnosis not present

## 2020-02-16 DIAGNOSIS — I10 Essential (primary) hypertension: Secondary | ICD-10-CM | POA: Diagnosis not present

## 2020-02-16 DIAGNOSIS — I25119 Atherosclerotic heart disease of native coronary artery with unspecified angina pectoris: Secondary | ICD-10-CM | POA: Diagnosis not present

## 2020-02-16 DIAGNOSIS — Z789 Other specified health status: Secondary | ICD-10-CM

## 2020-02-16 DIAGNOSIS — I5022 Chronic systolic (congestive) heart failure: Secondary | ICD-10-CM

## 2020-02-16 MED ORDER — REPATHA SURECLICK 140 MG/ML ~~LOC~~ SOAJ
140.0000 mg | SUBCUTANEOUS | 11 refills | Status: DC
Start: 2020-02-16 — End: 2020-02-16

## 2020-02-16 MED ORDER — REPATHA SURECLICK 140 MG/ML ~~LOC~~ SOAJ: 140.0000 mg | SUBCUTANEOUS | 11 refills | Status: DC

## 2020-02-16 MED FILL — REPATHA SURECLICK 140 MG/ML: 140 | 28 days supply | Qty: 2 | Fill #0

## 2020-02-16 NOTE — Telephone Encounter (Signed)
Tommy Medal went to the pt while he was seeing the Dr. Margaretann Loveless and educated the pt on how to use their repatha sureclick that was approved from a prior authorization w/insurance. Pt voiced understanding and was instructed to come after 4th injection to complete fasting lipids and lfts, pt voiced understanding

## 2020-02-16 NOTE — Patient Instructions (Signed)
Medication Instructions:  No Changes In Medications at this time.  *If you need a refill on your cardiac medications before your next appointment, please call your pharmacy*  Lab Work: [place]  If you have labs (blood work) drawn today and your tests are completely normal, you will receive your results only by: Marland Kitchen MyChart Message (if you have MyChart) OR . A paper copy in the mail If you have any lab test that is abnormal or we need to change your treatment, we will call you to review the results.  Testing/Procedures: None Ordered At This Time.   Follow-Up: At [facility], you and your health needs are our priority.  As part of our continuing mission to provide you with exceptional heart care, we have created designated Provider Care Teams.  These Care Teams include your primary Cardiologist (physician) and Advanced Practice Providers (APPs -  Physician Assistants and Nurse Practitioners) who all work together to provide you with the care you need, when you need it.  We recommend signing up for the patient portal called "MyChart".  Sign up information is provided on this After Visit Summary.  MyChart is used to connect with patients for Virtual Visits (Telemedicine).  Patients are able to view lab/test results, encounter notes, upcoming appointments, etc.  Non-urgent messages can be sent to your provider as well.   To learn more about what you can do with MyChart, go to [email or web address].    Your next appointment:   6 month(s)  The format for your next appointment:   In Person  Provider:   Cherlynn Kaiser, MD  Other Instructions FOLLOW UP WITH [facility] IN 2 MONTHS   PLEASE CALL [place] AT [phone] IF YOU HAVE ANY ISSUES AFFORDING [place] BLOOD WORK DRAWN AFTER  4th DOSE OF INJECTION- YOU DO NOT NEED AN APPOINTMENT LAB IS OPEN IN OFFICE FROM MON-FRI 8am-4pm.

## 2020-02-16 NOTE — Progress Notes (Signed)
Cardiology Office Note:    Date:  02/16/2020   ID:  [name], DOB May 29, 1960, MRN [id number]  PCP:  Charlott Rakes, MD  Cardiologist:  Elouise Munroe, MD  Electrophysiologist:  None   Referring MD: Charlott Rakes, MD   Chief Complaint/Reason for Referral: Follow up CAD and HLD  History of Present Illness:    [name] is a 59 y.o. male with a history of CAD (s/p BMS to D1 in 2015, noted to be chronically occluded by cath in 12/2014 with scattered 30% RCA stenosis, 20% LCx, and 50% Prox-Mid LAD stenosis at that time), PE/DVT (s/p IVC filter placement in 2012), COPD, chronic diastolic CHF (EF 96-29% by echo in 12/2014), HTN, HLD, chronic Hepatitis C, substance use (Cocaine - UDS from 01/2016 positive), and tobacco use.    He was concerned about bilateral leg pain.  Dr. Fletcher Anon who saw him for abnormal vascular studies recommended stopping his atorvastatin.  His leg pain is gone away and he is feeling better.  He does however note that his legs still feel heavy.  I suspect this is related to spinal stenosis as did Dr. Fletcher Anon in his note from April.  I have asked the patient to contact his primary care physician to discuss this further and possibly refer to a spine specialist for further management. This is upcoming.  Less discomfort in chest after increasing imdur to 30 mg daily. Approved for Repatha for secondary prevention of CAD and HLD. Will need monitoring after 4th dose.   Well compensated with history of systolic CHF. NYHA class I-II symptoms.  Past Medical History:  Diagnosis Date  . Asthma   . CAD (coronary artery disease)    a. 09/2013 NSTEMI/PCI: LM nl, LAD 40-50%, D1 100 (2.25 x 28 Vision BMS), LCX min irregs, RI 60-70, 30, RCA 40-50/50-22ms/p, EF 45-50%.  b. cath 12/2014 -occulded BMS in diag, 50% pro to mid LAD, 60% ramus, 30% RCA   . Chest pain 08/2015  . Chronic diastolic CHF (congestive heart failure) ([place])    a. 09/2014 EF 45-50% by LV gram;  b. 01/2014  Echo: EF 55-60%, Gr 1 DD.  Marland Kitchen Cocaine abuse ([place])   . COPD (chronic obstructive pulmonary disease) ([place])   . DVT (deep venous thrombosis) ([place])    "I had ~ 10 in each leg" (12/20/2016)  . Essential hypertension   . Hepatitis C    "clear free over a year now"  . High cholesterol   . Myocardial infarction ([place]) ~ 2014  . Osteoarthritis    a. hands and toes.  . Pneumonia    "several times" (12/20/2016)  . Pulmonary embolism ([place])    a. 2012 - s/p IVC filter;  b. prev on eliquis - noncompliant.  . Shortness of breath dyspnea   . Sinus headache   . Squamous cell skin cancer 07/13/2016   "foot"  . Tobacco abuse     Past Surgical History:  Procedure Laterality Date  . CARDIAC CATHETERIZATION N/A 01/04/2015   Procedure: Left Heart Cath and Coronary Angiography;  Surgeon: Burnell Blanks, MD;  Location: [place] [facility];  Service: Cardiovascular;  Laterality: N/A;  . CORONARY ANGIOPLASTY WITH STENT PLACEMENT  10/21/13   BMS to D1  . CYST EXCISION N/A 05/10/2016   Procedure: EXCISION OF SEBACEOUS CYST UPPER BACK;  Surgeon: Donnie Mesa, MD;  Location: [place];  Service: General;  Laterality: N/A;  . FOOT SURGERY Right ~ 06/2016   "said there was skin  cancer on it"  . LEFT HEART CATHETERIZATION WITH CORONARY ANGIOGRAM N/A 10/21/2013   Procedure: LEFT HEART CATHETERIZATION WITH CORONARY ANGIOGRAM;  Surgeon: Leonie Man, MD;  Location: [facility];  Service: Cardiovascular;  Laterality: N/A;  . VENA CAVA FILTER PLACEMENT  ~ 2007-~ 2017   "1 in my neck; 2 in my wrist"    Current Medications: No outpatient medications have been marked as taking for the 02/16/20 encounter (Office Visit) with Elouise Munroe, MD.   Meds reviewed, unable to pull into chart.  Allergies:   Atorvastatin   Social History   Tobacco Use  . Smoking status: Current Every Day Smoker    Packs/day: 0.25    Years: 39.00    Pack years: 9.75    Types: Cigarettes, Cigars  . Smokeless tobacco: Never Used   Vaping Use  . Vaping Use: Never used  Substance Use Topics  . Alcohol use: Yes    Alcohol/week: 6.0 standard drinks    Types: 2 Cans of beer, 4 Shots of liquor per week  . Drug use: Not Currently    Types: Cocaine    Comment: 12/20/2016 "might have used some the other day; I'm not sure"     Family History: The patient's family history includes Allergies in his brother, mother, and sister; Asthma in his mother; Coronary artery disease in his father; Deep vein thrombosis in his brother; Heart attack in his father. There is no history of Colon cancer, Liver cancer, Esophageal cancer, Colon polyps, Rectal cancer, Pancreatic cancer, or Stomach cancer.  ROS:   Please see the history of present illness.    All other systems reviewed and are negative.  EKGs/Labs/Other Studies Reviewed:    The following studies were reviewed today:  Recent Labs: 04/21/2019: BNP 43.5 09/14/2019: Magnesium 2.3 09/17/2019: BUN 20; Creatinine, Ser 0.70; Hemoglobin 10.3; Platelets 165; Potassium 4.2; Sodium 139  Recent Lipid Panel    Component Value Date/Time   CHOL 231 (H) 01/26/2020 1227   TRIG 90 01/26/2020 1227   HDL 53 01/26/2020 1227   CHOLHDL 4.4 01/26/2020 1227   CHOLHDL 3.1 01/01/2015 0208   VLDL 12 01/01/2015 0208   LDLCALC 162 (H) 01/26/2020 1227    Physical Exam:    VS:  BP 118/76 (BP Location: Left Arm, Patient Position: Sitting)   Pulse 83   Ht 6\' 2"  (1.88 m)   Wt 218 lb 9.6 oz (99.2 kg)   SpO2 98%   BMI 28.07 kg/m     Wt Readings from Last 5 Encounters:  02/16/20 218 lb 9.6 oz (99.2 kg)  01/26/20 218 lb 3.2 oz (99 kg)  01/01/20 219 lb (99.3 kg)  12/15/19 216 lb 6.4 oz (98.2 kg)  09/15/19 212 lb 4.9 oz (96.3 kg)    Constitutional: No acute distress Eyes: sclera non-icteric, normal conjunctiva and lids ENMT: normal dentition, moist mucous membranes Cardiovascular: regular rhythm, normal rate, no murmurs. S1 and S2 normal. Radial pulses normal bilaterally. No jugular venous  distention.  Respiratory: clear to auscultation bilaterally GI : normal bowel sounds, soft and nontender. No distention.   MSK: extremities warm, well perfused. No edema.  NEURO: grossly nonfocal exam, moves all extremities. PSYCH: alert and oriented x 3, normal mood and affect.   ASSESSMENT:    1. Coronary artery disease involving native coronary artery of native heart with angina pectoris ([place])   2. Stable angina pectoris ([place])   3. Hyperlipidemia, unspecified hyperlipidemia type   4. Statin intolerance   5. Essential  hypertension   6. Chronic systolic heart failure (HCC)    PLAN:    Coronary artery disease involving native coronary artery of native heart with angina pectoris ([place]) Stable angina pectoris ([place]) - currently doing well on Eliquis (history of DVT/PE and IVC filter), Imdur 30 mg daily, metoprolol succinate 25 mg daily. BP normal but low end, may make adding losartan difficult at this time.   Hyperlipidemia, unspecified hyperlipidemia type Statin intolerance - begin repatha and follow up after 4th dose for lab testing.   Essential hypertension - bp low normal today, continue current regimen.  Chronic systolic heart failure (HCC) Heart Failure Therapy ACE-I/ARB/ARNI: consider adding low dose losartan or entresto if BP tolerates BB: metoprolol MRA: non currently, could add spironolactone 25 mg daily SGLT2I: none currently, will add at next follow up Diuretic plan: furosemide 40 mg daily.   If medication affordability is an issues, will discuss further with SW.   Total time of encounter: 30 minutes total time of encounter, including 20 minutes spent in face-to-face patient care on the date of this encounter. This time includes coordination of care and counseling regarding above mentioned problem list. Remainder of non-face-to-face time involved reviewing chart documents/testing relevant to the patient encounter and documentation in the medical record. I have  independently reviewed documentation from referring provider.   Cherlynn Kaiser, MD [place]  [facility]    Medication Adjustments/Labs and Tests Ordered: Current medicines are reviewed at length with the patient today.  Concerns regarding medicines are outlined above.   No orders of the defined types were placed in this encounter.    No orders of the defined types were placed in this encounter.   Patient Instructions  Medication Instructions:  No Changes In Medications at this time.  *If you need a refill on your cardiac medications before your next appointment, please call your pharmacy*  Lab Work: [place]  If you have labs (blood work) drawn today and your tests are completely normal, you will receive your results only by: Marland Kitchen MyChart Message (if you have MyChart) OR . A paper copy in the mail If you have any lab test that is abnormal or we need to change your treatment, we will call you to review the results.  Testing/Procedures: None Ordered At This Time.   Follow-Up: At [facility], you and your health needs are our priority.  As part of our continuing mission to provide you with exceptional heart care, we have created designated Provider Care Teams.  These Care Teams include your primary Cardiologist (physician) and Advanced Practice Providers (APPs -  Physician Assistants and Nurse Practitioners) who all work together to provide you with the care you need, when you need it.  We recommend signing up for the patient portal called "MyChart".  Sign up information is provided on this After Visit Summary.  MyChart is used to connect with patients for Virtual Visits (Telemedicine).  Patients are able to view lab/test results, encounter notes, upcoming appointments, etc.  Non-urgent messages can be sent to your provider as well.   To learn more about what you can do with MyChart, go to  [email or web address].    Your next appointment:   6 month(s)  The format for your next appointment:   In Person  Provider:   Cherlynn Kaiser, MD  Other Instructions FOLLOW UP WITH [facility] IN 2 MONTHS   [place] AT [phone] IF  YOU HAVE ANY ISSUES AFFORDING REPATHA  PLEASE RETURN TO HAVE FASTING BLOOD WORK DRAWN AFTER  4th DOSE OF INJECTION- YOU DO NOT NEED AN APPOINTMENT LAB IS OPEN IN OFFICE FROM MON-FRI 8am-4pm.

## 2020-03-12 ENCOUNTER — Encounter: Payer: Self-pay | Admitting: Neurology

## 2020-03-12 ENCOUNTER — Ambulatory Visit (INDEPENDENT_AMBULATORY_CARE_PROVIDER_SITE_OTHER): Payer: Medicare Other | Admitting: Neurology

## 2020-03-12 VITALS — BP 115/80 | HR 67 | Ht 74.0 in | Wt 216.4 lb

## 2020-03-12 DIAGNOSIS — R269 Unspecified abnormalities of gait and mobility: Secondary | ICD-10-CM

## 2020-03-12 DIAGNOSIS — G8929 Other chronic pain: Secondary | ICD-10-CM | POA: Diagnosis not present

## 2020-03-12 DIAGNOSIS — R531 Weakness: Secondary | ICD-10-CM | POA: Diagnosis not present

## 2020-03-12 DIAGNOSIS — M5441 Lumbago with sciatica, right side: Secondary | ICD-10-CM | POA: Diagnosis not present

## 2020-03-12 NOTE — Progress Notes (Addendum)
Chief Complaint  Patient presents with  . New Patient (Initial Visit)    New room, alone. Internal referral from Charlott Rakes, MD (PCP) for neuropathy and myopathy. Hansen booster 03/04/20. Has to hold onto chair to get out of chair, hurts. Does not do steps d/t pain. Having right leg pain. Both feet has numb/ting. Once he starts walking, it improves. Has had 9 blood clots in each leg. He fell a week ago, Monday. Fell in hole outside. Denies hitting head or injury. He fell off bus steps about 2.5 weeks ago. Hurt left side of back and saw urgent care. Laying down hurts.    HISTORICAL  JAMAREE HOSIER is a 59 year old male, seen in request by his primary care physician Dr. Margarita Rana, [name] for evaluation of lower extremity weakness, gait abnormality, initial evaluation was on March 12, 2020.  I reviewed and summarized the referring note. PMHX. HTN Asthma CAD, DVT since 2014, and PE. Smoker,  2 cigarette this week  He reported more than 10 years history of bilateral lower extremity difficulty, he is a poor historian, reported without clear triggers, he began to notice difficulty getting up from seated position, he has to push on chair arms to get up from seated position, slight worsening of the past 10 years, he gave me an example, he was working the yard, fell to the ground, even with his friend's help, he could not get up from low seated position, he has to climb to the front staff to pull himself up, he denies significant upper extremity weakness  He does complains of slow worsening low back pain, especially on the right side, radiating pain to right lower extremity, also complains of bilateral feet and lower extremity paresthesia, numbness, also involving left calf,  He complains some neck pain, denies radiating pain, denies numbness tingling at his feet, denies bowel bladder incontinence, he denies double vision, no chewing or swallowing difficulty.  He reported a long history  of recurrent bilateral lower extremity DVT, PE, went on disability more than 12 years ago.  Lab in Nov 2021, Lipid Panel, LDL 162, cholesterol 231, Methylmalonic acid level 763, BMP, Cal 8.6, Hg 10.3, ferritin 42, B12 259,  HIV -  REVIEW OF SYSTEMS: Full 14 system review of systems performed and notable only for as above All other review of systems were negative.  ALLERGIES: Allergies  Allergen Reactions  . Atorvastatin     myalgia    HOME MEDICATIONS: Current Outpatient Medications  Medication Sig Dispense Refill  . albuterol (PROAIR HFA) 108 (90 Base) MCG/ACT inhaler Inhale 2 puffs into the lungs every 6 (six) hours as needed for wheezing or shortness of breath. 18 g 6  . albuterol (PROVENTIL) (2.5 MG/3ML) 0.083% nebulizer solution Take 3 mLs (2.5 mg total) by nebulization every 6 (six) hours as needed for wheezing or shortness of breath. 75 mL 3  . apixaban (ELIQUIS) 5 MG TABS tablet Take 1 tablet (5 mg total) by mouth 2 (two) times daily. 60 tablet 6  . cetirizine (ZYRTEC) 10 MG tablet Take 1 tablet (10 mg total) by mouth daily. 30 tablet 3  . Evolocumab (REPATHA SURECLICK) 270 MG/ML SOAJ Inject 140 mg into the skin every 14 (fourteen) days. 2 mL 11  . fluticasone (FLONASE) 50 MCG/ACT nasal spray Place 2 sprays into both nostrils 2 (two) times daily. 16 g 2  . Fluticasone-Umeclidin-Vilant (TRELEGY ELLIPTA) 100-62.5-25 MCG/INH AEPB Inhale 1 Inhaler into the lungs daily. (Patient taking differently: Inhale 1 Inhaler into  the lungs daily. Pt uses as needed.) 1 each 6  . furosemide (LASIX) 40 MG tablet Take 1 tablet (40 mg total) by mouth daily. 30 tablet 6  . gabapentin (NEURONTIN) 300 MG capsule Take 1 capsule (300 mg total) by mouth 2 (two) times daily. (Patient taking differently: Take 300-600 mg by mouth at bedtime as needed (pain).) 60 capsule 6  . HYDROcodone-acetaminophen (NORCO/VICODIN) 5-325 MG tablet Take 1-2 tablets by mouth every 6 (six) hours as needed. 10 tablet 0  .  isosorbide mononitrate (IMDUR) 30 MG 24 hr tablet Take 1 tablet (30 mg total) by mouth daily. 30 tablet 1  . metoprolol succinate (TOPROL XL) 25 MG 24 hr tablet Take 1 tablet (25 mg total) by mouth daily. 90 tablet 3  . nitroGLYCERIN (NITROSTAT) 0.4 MG SL tablet Place 1 tablet (0.4 mg total) under the tongue every 5 (five) minutes as needed for chest pain. 25 tablet 3  . pantoprazole (PROTONIX) 40 MG tablet TAKE 1 TABLET (40 MG TOTAL) BY MOUTH DAILY. 30 tablet 3  . tamsulosin (FLOMAX) 0.4 MG CAPS capsule Take 1 capsule (0.4 mg total) by mouth daily. (Patient taking differently: Take 0.4 mg by mouth daily. As Needed) 30 capsule 0  . tiZANidine (ZANAFLEX) 4 MG tablet Take 1 tablet (4 mg total) by mouth every 8 (eight) hours as needed for muscle spasms. 60 tablet 2  . vitamin B-12 (CYANOCOBALAMIN) 1000 MCG tablet Take 1 tablet (1,000 mcg total) by mouth daily. 30 tablet 5   No current facility-administered medications for this visit.    PAST MEDICAL HISTORY: Past Medical History:  Diagnosis Date  . Asthma   . CAD (coronary artery disease)    a. 09/2013 NSTEMI/PCI: LM nl, LAD 40-50%, D1 100 (2.25 x 28 Vision BMS), LCX min irregs, RI 60-70, 30, RCA 40-50/50-30ms/p, EF 45-50%.  b. cath 12/2014 -occulded BMS in diag, 50% pro to mid LAD, 60% ramus, 30% RCA   . Chest pain 08/2015  . Chronic diastolic CHF (congestive heart failure) ([place])    a. 09/2014 EF 45-50% by LV gram;  b. 01/2014 Echo: EF 55-60%, Gr 1 DD.  Marland Kitchen Cocaine abuse ([place])   . COPD (chronic obstructive pulmonary disease) ([place])   . DVT (deep venous thrombosis) ([place])    "I had ~ 10 in each leg" (12/20/2016)  . Essential hypertension   . Hepatitis C    "clear free over a year now"  . High cholesterol   . Myocardial infarction ([place]) ~ 2014  . Osteoarthritis    a. hands and toes.  . Pneumonia    "several times" (12/20/2016)  . Pulmonary embolism ([place])    a. 2012 - s/p IVC filter;  b. prev on eliquis - noncompliant.  . Shortness of breath  dyspnea   . Sinus headache   . Squamous cell skin cancer 07/13/2016   "foot"  . Tobacco abuse     PAST SURGICAL HISTORY: Past Surgical History:  Procedure Laterality Date  . CARDIAC CATHETERIZATION N/A 01/04/2015   Procedure: Left Heart Cath and Coronary Angiography;  Surgeon: Burnell Blanks, MD;  Location: [place] [facility];  Service: Cardiovascular;  Laterality: N/A;  . CORONARY ANGIOPLASTY WITH STENT PLACEMENT  10/21/13   BMS to D1  . CYST EXCISION N/A 05/10/2016   Procedure: EXCISION OF SEBACEOUS CYST UPPER BACK;  Surgeon: Donnie Mesa, MD;  Location: [place] [facility];  Service: General;  Laterality: N/A;  . FOOT SURGERY Right ~ 06/2016   "said there was skin  cancer on it"  . LEFT HEART CATHETERIZATION WITH CORONARY ANGIOGRAM N/A 10/21/2013   Procedure: LEFT HEART CATHETERIZATION WITH CORONARY ANGIOGRAM;  Surgeon: Leonie Man, MD;  Location: [facility]ATH [facility];  Service: Cardiovascular;  Laterality: N/A;  . VENA CAVA FILTER PLACEMENT  ~ 2007-~ 2017   "1 in my neck; 2 in my wrist"    FAMILY HISTORY: Family History  Problem Relation Age of Onset  . Asthma Mother   . Allergies Mother   . Allergies Sister   . Allergies Brother   . Deep vein thrombosis Brother        two brothers with recurrent DVT  . Heart attack Father        a.60s b. deceased in his 39s  . Coronary artery disease Father   . Colon cancer Neg Hx   . Liver cancer Neg Hx   . Esophageal cancer Neg Hx   . Colon polyps Neg Hx   . Rectal cancer Neg Hx   . Pancreatic cancer Neg Hx   . Stomach cancer Neg Hx     SOCIAL HISTORY: Social History   Socioeconomic History  . Marital status: Single    Spouse name: Not on file  . Number of children: Not on file  . Years of education: Not on file  . Highest education level: Not on file  Occupational History  . Occupation: unemployed  Tobacco Use  . Smoking status: Current Every Day Smoker    Packs/day: 0.25    Years: 39.00    Pack years: 9.75    Types: Cigarettes,  Cigars  . Smokeless tobacco: Never Used  Vaping Use  . Vaping Use: Never used  Substance and Sexual Activity  . Alcohol use: Yes    Alcohol/week: 6.0 standard drinks    Types: 2 Cans of beer, 4 Shots of liquor per week  . Drug use: Not Currently    Types: Cocaine    Comment: 12/20/2016 "might have used some the other day; I'm not sure"  . Sexual activity: Not Currently  Other Topics Concern  . Not on file  Social History Narrative   Lives in [place].   Social Determinants of Health   Financial Resource Strain: Not on file  Food Insecurity: Not on file  Transportation Needs: Not on file  Physical Activity: Not on file  Stress: Not on file  Social Connections: Not on file  Intimate Partner Violence: Not on file     PHYSICAL EXAM   Vitals:   03/12/20 1054  BP: 115/80  Pulse: 67  SpO2: 97%  Weight: 216 lb 6.4 oz (98.2 kg)  Height: 6\' 2"  (1.88 m)   Not recorded     Body mass index is 27.78 kg/m.  PHYSICAL EXAMNIATION:  Gen: NAD, conversant, well nourised, well groomed                     Cardiovascular: Regular rate rhythm, no peripheral edema, warm, nontender. Eyes: Conjunctivae clear without exudates or hemorrhage Neck: Supple, no carotid bruits. Pulmonary: Clear to auscultation bilaterally   NEUROLOGICAL EXAM:  MENTAL STATUS: Speech:    Speech is normal; fluent and spontaneous with normal comprehension.  Cognition:     Orientation to time, place and person     Normal recent and remote memory     Normal Attention span and concentration     Normal Language, naming, repeating,spontaneous speech     Fund of knowledge   CRANIAL NERVES: CN II: Visual fields  are full to confrontation. Pupils are round equal and briskly reactive to light. CN III, IV, VI: extraocular movement are normal. No ptosis. CN V: Facial sensation is intact to light touch CN VII: Face is symmetric with normal eye closure  CN VIII: Hearing is normal to causal conversation. CN IX, X:  Phonation is normal. CN XI: Head turning and shoulder shrug are intact  MOTOR: He has mild shoulder abduction, external rotation, mild elbow flexion, elbow extension weakness.  He has mild to moderate hip flexion weakness.  REFLEXES: Reflexes are 2+ and symmetric at the biceps, triceps, knees, and ankles. Plantar responses are extensor bilaterally  SENSORY: Intact to light touch, pinprick and vibratory sensation are intact in fingers and toes.  COORDINATION: There is no trunk or limb dysmetria noted.  GAIT/STANCE: He needs to push on chair arm to get up from seated position, wide-based, stiff, mildly unsteady gait  DIAGNOSTIC DATA (LABS, IMAGING, TESTING) - I reviewed patient records, labs, notes, testing and imaging myself where available.   ASSESSMENT AND PLAN  MANNING LUNA is a 59 y.o. male   Gait abnormality Right side low back pain  She has mild bilateral upper extremity proximal muscle weakness, mild to moderate bilateral lower extremity proximal muscle weakness, hyperreflexia of bilateral upper extremity, and patella, with bilateral Babinski signs, wide-based, unsteady gait,  Need to rule out myopathy, cervical spondylitic myelopathy, right lumbar radiculopathy  MRI of cervical spine, lumbar spine  EMG nerve conduction study  Laboratory evaluations     Marcial Pacas, M.D. Ph.D.  [facility] [address], [place], [place] [address] Ph: [phone] Fax: [phone]  CC:  Charlott Rakes, MD [address] [place],   [address]

## 2020-03-15 ENCOUNTER — Telehealth: Payer: Self-pay | Admitting: Neurology

## 2020-03-15 NOTE — Telephone Encounter (Signed)
[employer] medicare/medicaid order sent to GI. No auth they will reach out to the patient to schedule.  °

## 2020-03-17 ENCOUNTER — Telehealth: Payer: Self-pay | Admitting: *Deleted

## 2020-03-17 ENCOUNTER — Other Ambulatory Visit: Payer: Self-pay | Admitting: Family Medicine

## 2020-03-17 DIAGNOSIS — G629 Polyneuropathy, unspecified: Secondary | ICD-10-CM

## 2020-03-17 LAB — ACETYLCHOLINE RECEPTOR AB, ALL
AChR Binding Ab, Serum: 0.04 nmol/L (ref 0.00–0.24)
Acetylchol Block Ab: 24 % (ref 0–25)

## 2020-03-17 LAB — THYROID PANEL WITH TSH
Free Thyroxine Index: 2.5 (ref 1.2–4.9)
T3 Uptake Ratio: 27 % (ref 24–39)
T4, Total: 9.3 ug/dL (ref 4.5–12.0)
TSH: 1.81 u[IU]/mL (ref 0.450–4.500)

## 2020-03-17 LAB — ANA W/REFLEX IF POSITIVE: Anti Nuclear Antibody (ANA): NEGATIVE

## 2020-03-17 LAB — CK: Total CK: 197 U/L (ref 41–331)

## 2020-03-17 LAB — RPR: RPR Ser Ql: NONREACTIVE

## 2020-03-17 MED FILL — SYMBICORT 160-4.5 MCG INH: 160-4.5 | 30 days supply | Qty: 10 | Fill #3

## 2020-03-17 MED FILL — ISOSORBIDE MN ER 30 MG TAB: 30 | 30 days supply | Qty: 15 | Fill #2

## 2020-03-17 MED FILL — METOPROLOL SUCCINATE ER 25: 25 | 90 days supply | Qty: 90 | Fill #2

## 2020-03-17 MED FILL — REPATHA SURECLICK 140 MG/ML: 140 | 28 days supply | Qty: 2 | Fill #1

## 2020-03-17 MED FILL — GABAPENTIN 300 MG CAPSULE: 300 | 30 days supply | Qty: 60 | Fill #0

## 2020-03-17 MED FILL — ELIQUIS 5 MG TABLET: 5 | 30 days supply | Qty: 60 | Fill #1

## 2020-03-17 MED FILL — TRELEGY ELLIPTA 100-62.5-25: 100-62.5-25 | 30 days supply | Qty: 60 | Fill #2

## 2020-03-17 MED FILL — PANTOPRAZOLE SOD DR 40 MG T: 40 | 30 days supply | Qty: 30 | Fill #1

## 2020-03-17 NOTE — Telephone Encounter (Signed)
I spoke to the patient and provided him with the lab results.  

## 2020-03-17 NOTE — Telephone Encounter (Signed)
-----   Message from Marcial Pacas, MD sent at 03/17/2020  3:56 PM EST ----- Please call patient for normal laboratory result

## 2020-03-22 ENCOUNTER — Ambulatory Visit (HOSPITAL_COMMUNITY)
Admission: EM | Admit: 2020-03-22 | Discharge: 2020-03-22 | Disposition: A | Discharge status: Home / Self Care | Payer: Medicare Other

## 2020-03-22 ENCOUNTER — Other Ambulatory Visit: Payer: Self-pay | Admitting: Student

## 2020-03-22 ENCOUNTER — Other Ambulatory Visit: Payer: Self-pay

## 2020-03-22 ENCOUNTER — Encounter (HOSPITAL_COMMUNITY): Payer: Self-pay

## 2020-03-22 DIAGNOSIS — M5431 Sciatica, right side: Secondary | ICD-10-CM | POA: Diagnosis not present

## 2020-03-22 MED ORDER — PREDNISONE 10 MG (21) PO TBPK: ORAL_TABLET | Freq: Every day | ORAL | 0 refills | Status: DC

## 2020-03-22 NOTE — Discharge Instructions (Addendum)
Start the prednisone taper. Follow-up with your primary care provider if you continue to have these symptoms despite our treatment plan.   Head to ED if you have chest pain, shortness of breath, calf pain, swelling in your lower legs, etc.

## 2020-03-22 NOTE — ED Triage Notes (Signed)
Pt in with c/o right lower back pain that radiates down his leg that has been going on for 1 month now.   States when he walks he feels tingling in his feet  Pt has been taking muscle relaxer's with no relief

## 2020-03-22 NOTE — ED Provider Notes (Signed)
[facility]    CSN: [id number] Arrival date & time: 03/22/20  0800      History   Chief Complaint Chief Complaint  Patient presents with  . Back Pain    HPI [name] is a 59 y.o. male presenting for back pain. History of neuropathy, myopathy, CAD, asthma, chronic diastolic CHF, COPD, hypertension. He presented to this urgent care 1 month ago following falling. At that time he was treated for L back pain with prednisone taper and hydrocodone and muscle relaxer, with relief. Today he is presenting today for right lower back pain with sciatica radiating down right leg, for one month. States pain is 10/10. Endorses tinging in left foot that begins when he stands and goes away following standing for 1-2 minutes. No relief with muscle relaxers; per pt, hydrocodone provided him with relief. At baseline he has weakness in LEs and is followed by neurology for this; denies changes in weakness. Denies numbness. Denies bowel/bladder sx, new weakness or sensation changes in arms/legs, chest pain, shortness of breath, headaches, shortness of breath, calf pain, swelling in LEs. Denies any new falls in the last month.  HPI  Past Medical History:  Diagnosis Date  . Asthma   . CAD (coronary artery disease)    a. 09/2013 NSTEMI/PCI: LM nl, LAD 40-50%, D1 100 (2.25 x 28 Vision BMS), LCX min irregs, RI 60-70, 30, RCA 40-50/50-37ms/p, EF 45-50%.  b. cath 12/2014 -occulded BMS in diag, 50% pro to mid LAD, 60% ramus, 30% RCA   . Chest pain 08/2015  . Chronic diastolic CHF (congestive heart failure) ([place])    a. 09/2014 EF 45-50% by LV gram;  b. 01/2014 Echo: EF 55-60%, Gr 1 DD.  Marland Kitchen Cocaine abuse ([place])   . COPD (chronic obstructive pulmonary disease) ([place])   . DVT (deep venous thrombosis) ([place])    "I had ~ 10 in each leg" (12/20/2016)  . Essential hypertension   . Hepatitis C    "clear free over a year now"  . High cholesterol   . Myocardial infarction ([place]) ~ 2014  . Osteoarthritis     a. hands and toes.  . Pneumonia    "several times" (12/20/2016)  . Pulmonary embolism ([place])    a. 2012 - s/p IVC filter;  b. prev on eliquis - noncompliant.  . Shortness of breath dyspnea   . Sinus headache   . Squamous cell skin cancer 07/13/2016   "foot"  . Tobacco abuse     Patient Active Problem List   Diagnosis Date Noted  . Gait abnormality 03/12/2020  . Chronic right-sided low back pain with right-sided sciatica 03/12/2020  . Weakness 03/12/2020  . Pain due to onychomycosis of toenails of both feet 01/30/2020  . Hyperlipidemia 01/26/2020  . GERD (gastroesophageal reflux disease) 04/24/2017  . COPD exacerbation ([place]) 04/05/2017  . Chronic sinusitis 02/02/2017  . COPD with acute exacerbation ([place]) 12/20/2016  . History of squamous cell carcinoma 08/03/2016  . Psoriasis 08/03/2016  . Squamous cell skin cancer 07/13/2016  . Chronic anticoagulation 02/01/2016  . Malnutrition of moderate degree 06/05/2015  . Liver fibrosis 04/08/2015  . COPD (chronic obstructive pulmonary disease) ([place]) 01/01/2015  . Essential hypertension 01/01/2015  . Unstable angina ([place]) 01/01/2015  . History of coronary artery disease   . Cocaine abuse ([place]) 12/01/2014  . Pleuritic chest pain 12/01/2014  . Chronic diastolic heart failure ([place]) 12/01/2014  . QT prolongation 12/01/2014  . Chronic hepatitis C without hepatic coma ([place]) 06/18/2014  .  HTN (hypertension) 04/27/2014  . Coronary artery disease involving native coronary artery of native heart without angina pectoris   . CAD (coronary artery disease) 10/21/2013  . NSTEMI (non-ST elevated myocardial infarction) ([place]) 10/21/2013  . Anemia 06/20/2013  . Regular alcohol consumption 06/18/2013  . History of DVT (deep vein thrombosis) 02/21/2013  . Thrombocytopenia ([place]) 08/09/2012  . Tobacco abuse 08/09/2012  . History of noncompliance with medical treatment 03/24/2012  . Atopic dermatitis 03/24/2012  . History of pulmonary embolism 02/10/2012     Past Surgical History:  Procedure Laterality Date  . CARDIAC CATHETERIZATION N/A 01/04/2015   Procedure: Left Heart Cath and Coronary Angiography;  Surgeon: Burnell Blanks, MD;  Location: [facility];  Service: Cardiovascular;  Laterality: N/A;  . CORONARY ANGIOPLASTY WITH STENT PLACEMENT  10/21/13   BMS to D1  . CYST EXCISION N/A 05/10/2016   Procedure: EXCISION OF SEBACEOUS CYST UPPER BACK;  Surgeon: Donnie Mesa, MD;  Location: [facility];  Service: General;  Laterality: N/A;  . FOOT SURGERY Right ~ 06/2016   "said there was skin cancer on it"  . LEFT HEART CATHETERIZATION WITH CORONARY ANGIOGRAM N/A 10/21/2013   Procedure: LEFT HEART CATHETERIZATION WITH CORONARY ANGIOGRAM;  Surgeon: Leonie Man, MD;  Location[facility];  Service: Cardiovascular;  Laterality: N/A;  . VENA CAVA FILTER PLACEMENT  ~ 2007-~ 2017   "1 in my neck; 2 in my wrist"       Home Medications    Prior to Admission medications   Medication Sig Start Date End Date Taking? Authorizing Provider  albuterol (PROAIR HFA) 108 (90 Base) MCG/ACT inhaler Inhale 2 puffs into the lungs every 6 (six) hours as needed for wheezing or shortness of breath. 06/25/19   Argentina Donovan, PA-C  albuterol (PROVENTIL) (2.5 MG/3ML) 0.083% nebulizer solution Take 3 mLs (2.5 mg total) by nebulization every 6 (six) hours as needed for wheezing or shortness of breath. 04/21/19   Charlott Rakes, MD  apixaban (ELIQUIS) 5 MG TABS tablet Take 1 tablet (5 mg total) by mouth 2 (two) times daily. 01/01/20   Charlott Rakes, MD  cetirizine (ZYRTEC) 10 MG tablet Take 1 tablet (10 mg total) by mouth daily. 01/01/20   Charlott Rakes, MD  Evolocumab (REPATHA SURECLICK) 937 MG/ML SOAJ Inject 140 mg into the skin every 14 (fourteen) days. 02/16/20   Elouise Munroe, MD  fluticasone (FLONASE) 50 MCG/ACT nasal spray Place 2 sprays into both nostrils 2 (two) times daily. 01/01/20   Charlott Rakes, MD  furosemide (LASIX) 40 MG tablet Take  1 tablet (40 mg total) by mouth daily. 01/01/20   Charlott Rakes, MD  gabapentin (NEURONTIN) 300 MG capsule TAKE 1 CAPSULE (300 MG TOTAL) BY MOUTH 2 (TWO) TIMES DAILY. 03/17/20   Charlott Rakes, MD  HYDROcodone-acetaminophen (NORCO/VICODIN) 5-325 MG tablet Take 1-2 tablets by mouth every 6 (six) hours as needed. 02/05/20   Loura Halt A, NP  isosorbide mononitrate (IMDUR) 30 MG 24 hr tablet Take 1 tablet (30 mg total) by mouth daily. 12/15/19 01/25/21  Elouise Munroe, MD  metoprolol succinate (TOPROL XL) 25 MG 24 hr tablet Take 1 tablet (25 mg total) by mouth daily. 07/18/19   Elouise Munroe, MD  nitroGLYCERIN (NITROSTAT) 0.4 MG SL tablet Place 1 tablet (0.4 mg total) under the tongue every 5 (five) minutes as needed for chest pain. 08/05/19 01/25/21  Deberah Pelton, NP  pantoprazole (PROTONIX) 40 MG tablet TAKE 1 TABLET (40 MG TOTAL) BY MOUTH DAILY.  01/30/20   Hoy Register, MD  predniSONE (STERAPRED UNI-PAK 21 TAB) 10 MG (21) TBPK tablet Take by mouth daily. Take 6 tabs by mouth daily  for 2 days, then 5 tabs for 2 days, then 4 tabs for 2 days, then 3 tabs for 2 days, 2 tabs for 2 days, then 1 tab by mouth daily for 2 days 03/22/20   Rhys Martini, PA-C  SYMBICORT 160-4.5 MCG/ACT inhaler Inhale 2 puffs into the lungs 2 (two) times daily. 03/17/20   [provider]  tiZANidine (ZANAFLEX) 4 MG tablet Take 1 tablet (4 mg total) by mouth every 8 (eight) hours as needed for muscle spasms. 01/01/20   Hoy Register, MD  vitamin B-12 (CYANOCOBALAMIN) 1000 MCG tablet Take 1 tablet (1,000 mcg total) by mouth daily. 10/23/19   Seawell, Shanon Brow, DO    Family History Family History  Problem Relation Age of Onset  . Asthma Mother   . Allergies Mother   . Allergies Sister   . Allergies Brother   . Deep vein thrombosis Brother        two brothers with recurrent DVT  . Heart attack Father        a.60s b. deceased in his 13s  . Coronary artery disease Father   . Colon cancer Neg Hx   .  Liver cancer Neg Hx   . Esophageal cancer Neg Hx   . Colon polyps Neg Hx   . Rectal cancer Neg Hx   . Pancreatic cancer Neg Hx   . Stomach cancer Neg Hx     Social History Social History   Tobacco Use  . Smoking status: Current Every Day Smoker    Packs/day: 0.25    Years: 39.00    Pack years: 9.75    Types: Cigarettes, Cigars  . Smokeless tobacco: Never Used  Vaping Use  . Vaping Use: Never used  Substance Use Topics  . Alcohol use: Yes    Alcohol/week: 6.0 standard drinks    Types: 2 Cans of beer, 4 Shots of liquor per week  . Drug use: Not Currently    Types: Cocaine    Comment: 12/20/2016 "might have used some the other day; I'm not sure"     Allergies   Atorvastatin   Review of Systems Review of Systems  Musculoskeletal: Positive for back pain.  All other systems reviewed and are negative.    Physical Exam Triage Vital Signs ED Triage Vitals  Enc Vitals Group     BP 03/22/20 0818 98/68     Pulse Rate 03/22/20 0818 70     Resp 03/22/20 0818 20     Temp --      Temp Source 03/22/20 0818 Oral     SpO2 03/22/20 0818 100 %     Weight --      Height --      Head Circumference --      Peak Flow --      Pain Score 03/22/20 0815 10     Pain Loc --      Pain Edu? --      Excl. in GC? --    No data found.  Updated Vital Signs BP 98/68 (BP Location: Left Arm)   Pulse 70   Resp 20   SpO2 100%   Visual Acuity Right Eye Distance:   Left Eye Distance:   Bilateral Distance:    Right Eye Near:   Left Eye Near:    Bilateral Near:  Physical Exam Vitals reviewed.  Constitutional:      General: He is not in acute distress.    Appearance: Normal appearance. He is not ill-appearing.  HENT:     Head: Normocephalic and atraumatic.  Cardiovascular:     Rate and Rhythm: Normal rate and regular rhythm.     Heart sounds: Normal heart sounds.  Pulmonary:     Effort: Pulmonary effort is normal.     Breath sounds: Normal breath sounds and air entry.   Abdominal:     General: Bowel sounds are normal.     Palpations: Abdomen is soft.     Tenderness: There is no abdominal tenderness. There is no right CVA tenderness, left CVA tenderness, guarding or rebound. Negative signs include Murphy's sign, Rovsing's sign and McBurney's sign.     Comments: No bowel or bladder incontinence.  Musculoskeletal:     Cervical back: Normal range of motion. No swelling, deformity, signs of trauma, rigidity, spasms, tenderness, bony tenderness or crepitus. No pain with movement.     Thoracic back: No swelling, deformity, signs of trauma, spasms, tenderness or bony tenderness. Normal range of motion. No scoliosis.     Lumbar back: Tenderness present. No swelling, deformity, signs of trauma, spasms or bony tenderness. Normal range of motion. Positive right straight leg raise test. Negative left straight leg raise test. No scoliosis.     Comments: R lumbar paraspinous tenderness to deep palpation.   Strength 5/5 in UEs. Strength 3/5 in bilateral LEs (unchanged from baseline per pt)  No pedal edema or calf swelling/tenderness  Neurological:     General: No focal deficit present.     Mental Status: He is alert and oriented to person, place, and time.     Cranial Nerves: Cranial nerves are intact. No cranial nerve deficit.     Sensory: Sensation is intact.     Coordination: Coordination is intact.     Comments: CN 2-12 intact. Sensation intact in UEs and LEs. Pt walks with limp at baseline.   Psychiatric:        Mood and Affect: Mood normal.        Behavior: Behavior normal.        Thought Content: Thought content normal.        Judgment: Judgment normal.      UC Treatments / Results  Labs (all labs ordered are listed, but only abnormal results are displayed) Labs Reviewed - No data to display  EKG   Radiology No results found.  Procedures Procedures (including critical care time)  Medications Ordered in UC Medications - No data to  display  Initial Impression / Assessment and Plan / UC Course  I have reviewed the triage vital signs and the nursing notes.  Pertinent labs & imaging results that were available during my care of the patient were reviewed by me and considered in my medical decision making (see chart for details).     This pt has a history of neuropathy and myopathy for which he is followed by neurology. He is taking gabapentin already. He walks with a limp at baseline and strength in LEs is decreased at baseline (per pt and per past notes). Lumbar spine xray 1 month ago (02/05/2020) without acute findings.   Wells score 1.5 due to history of DVT. Pt understands to head to ER if he has calf pain, LE swelling, shortness of breath, tachycardia, etc. He is afebrile nontachycardic nontachypneic with no calf swelling or pain today  For low  back pain with R sciatica, prednisone taper as below. He does not have diabetes- last glucose was 95 on 08/2019.   Denies numbness in arms/legs, denies new weakness in arms/legs, denies saddle anesthesia, denies bowel/bladder incontinence.   Follow-up with PCP or neurologist if continued symptoms despite the above regimen.  Final Clinical Impressions(s) / UC Diagnoses   Final diagnoses:  Sciatica of right side  Right sciatic nerve pain     Discharge Instructions     Start the prednisone taper. Follow-up with your primary care provider if you continue to have these symptoms despite our treatment plan.   Head to ED if you have chest pain, shortness of breath, calf pain, swelling in your lower legs, etc.     ED Prescriptions    Medication Sig Dispense Auth. Provider   predniSONE (STERAPRED UNI-PAK 21 TAB) 10 MG (21) TBPK tablet  (Status: Discontinued) Take by mouth daily. Take 6 tabs by mouth daily  for 2 days, then 5 tabs for 2 days, then 4 tabs for 2 days, then 3 tabs for 2 days, 2 tabs for 2 days, then 1 tab by mouth daily for 2 days 42 tablet Rhys Martini, PA-C    predniSONE (STERAPRED UNI-PAK 21 TAB) 10 MG (21) TBPK tablet Take by mouth daily. Take 6 tabs by mouth daily  for 2 days, then 5 tabs for 2 days, then 4 tabs for 2 days, then 3 tabs for 2 days, 2 tabs for 2 days, then 1 tab by mouth daily for 2 days 42 tablet Rhys Martini, PA-C     PDMP not reviewed this encounter.   Rhys Martini, PA-C 03/22/20 1049

## 2020-03-27 HISTORY — PX: BACK SURGERY: SHX140

## 2020-03-31 MED FILL — predniSONE 10 MG TABS: 10 | 12 days supply | Qty: 42 | Fill #0

## 2020-04-03 ENCOUNTER — Other Ambulatory Visit: Payer: Medicare Other

## 2020-04-07 ENCOUNTER — Ambulatory Visit: Payer: Medicare Other | Admitting: Family Medicine

## 2020-04-07 ENCOUNTER — Encounter (INDEPENDENT_AMBULATORY_CARE_PROVIDER_SITE_OTHER): Payer: Medicare Other | Admitting: Neurology

## 2020-04-07 ENCOUNTER — Ambulatory Visit (INDEPENDENT_AMBULATORY_CARE_PROVIDER_SITE_OTHER): Payer: Medicare Other | Admitting: Neurology

## 2020-04-07 DIAGNOSIS — M5441 Lumbago with sciatica, right side: Secondary | ICD-10-CM

## 2020-04-07 DIAGNOSIS — G8929 Other chronic pain: Secondary | ICD-10-CM

## 2020-04-07 DIAGNOSIS — R200 Anesthesia of skin: Secondary | ICD-10-CM | POA: Diagnosis not present

## 2020-04-07 DIAGNOSIS — R269 Unspecified abnormalities of gait and mobility: Secondary | ICD-10-CM

## 2020-04-07 DIAGNOSIS — R531 Weakness: Secondary | ICD-10-CM | POA: Diagnosis not present

## 2020-04-07 DIAGNOSIS — Z0289 Encounter for other administrative examinations: Secondary | ICD-10-CM

## 2020-04-07 MED FILL — REPATHA SURECLICK 140 MG/ML: 140 | 28 days supply | Qty: 2 | Fill #2

## 2020-04-07 MED FILL — predniSONE 10 MG TABS: 10 | 12 days supply | Qty: 42 | Fill #0

## 2020-04-07 NOTE — Progress Notes (Signed)
EMG report is under procedure tab. 

## 2020-04-07 NOTE — Procedures (Signed)
Full Name: Rollyn Scialdone Gender: Male MRN #: [id number] Date of Birth: 1960-08-14    Visit Date: 04/07/2020 07:50 Age: 60 Years Examining Physician: Marcial Pacas, MD  Referring Physician: Marcial Pacas, MD History: 60 years old male, with 10 years history of progressive worsening low back pain, radiating pain to bilateral lower extremities, gait abnormality  Summary of the test: Nerve conduction study: Right sural, superficial peroneal sensory responses were normal. Right peroneal to EDB, tibial motor responses were normal.  Right median, ulnar sensory and motor responses were normal  Electromyography: Selected needle examination of bilateral lower extremity muscles, bilateral lumbosacral paraspinal muscles; right upper extremity muscles and right cervical paraspinal muscles were performed  There was evidence of increased insertional activity, chronic neuropathic changes involving bilateral lower extremity muscles, L4-5 S1 myotomes.. Mild enlarged complex motor unit potential at bilateral lumbosacral paraspinals.  There was also evidence of mild chronic neuropathic changes involving right upper extremity muscles tested, right T1, C7, 5 6 myotomes.  Conclusion: This is an abnormal study. There is electrodiagnostic evidence of chronic bilateral lumbosacral radiculopathy, involving bilateral L4-5 S1 myotomes, this could indicate chronic bilateral lumbosacral radiculopathy. In addition, there is also evidence of chronic mild right cervical radiculopathy, involving right C5-6-7 T1 myotomes.    ------------------------------- Marcial Pacas M.D. PhD  [facility] [address], [place] [address] Tel: [phone] Fax: [phone]  Verbal informed consent was obtained from the patient, patient was informed of potential risk of procedure, including bruising, bleeding, hematoma formation, infection, muscle weakness, muscle pain, numbness, among others.          [place]    Nerve / Sites Muscle Latency Ref. Amplitude Ref. Rel Amp Segments Distance Velocity Ref. Area    ms ms mV mV %  cm m/s m/s mVms  R Median - APB     Wrist APB 3.0 ?4.4 5.6 ?4.0 100 Wrist - APB 7   19.5     Upper arm APB 7.8  5.4  96.1 Upper arm - Wrist 26 54 ?49 19.8  R Ulnar - ADM     Wrist ADM 2.1 ?3.3 10.4 ?6.0 100 Wrist - ADM 7   33.0     B.Elbow ADM 6.8  10.4  99.6 B.Elbow - Wrist 23 49 ?49 32.3     A.Elbow ADM 8.8  10.1  97.4 A.Elbow - B.Elbow 10 49 ?49 32.4         A.Elbow - Wrist      R Peroneal - EDB     Ankle EDB 3.9 ?6.5 3.0 ?2.0 100 Ankle - EDB 9   6.5     Fib head EDB 11.5  2.0  67.8 Fib head - Ankle 33 44 ?44 5.8     Pop fossa EDB 13.8  2.0  96.8 Pop fossa - Fib head 10 44 ?44 5.5         Pop fossa - Ankle      R Tibial - AH     Ankle AH 4.4 ?5.8 6.5 ?4.0 100 Ankle - AH 9   10.8     Pop fossa AH 15.2  3.3  50.9 Pop fossa - Ankle 45 42 ?41 8.6             SNC    Nerve / Sites Rec. Site Peak Lat Ref.  Amp Ref. Segments Distance Peak Diff Ref.    ms ms V V  cm ms ms  R Sural - Ankle (  Calf)     Calf Ankle 4.3 ?4.4 7 ?6 Calf - Ankle 14    R Superficial peroneal - Ankle     Lat leg Ankle 3.9 ?4.4 6 ?6 Lat leg - Ankle 14    R Median, Ulnar - Transcarpal comparison     Median Palm Wrist 2.2 ?2.2 59 ?35 Median Palm - Wrist 8       Ulnar Palm Wrist 2.1 ?2.2 13 ?12 Ulnar Palm - Wrist 8          Median Palm - Ulnar Palm  0.1 ?0.4  R Median - Orthodromic (Dig II, Mid palm)     Dig II Wrist 3.0 ?3.4 16 ?10 Dig II - Wrist 13    R Ulnar - Orthodromic, (Dig V, Mid palm)     Dig V Wrist 3.0 ?3.1 6 ?5 Dig V - Wrist 33                 F  Wave    Nerve F Lat Ref.   ms ms  R Tibial - AH 55.0 ?56.0  R Ulnar - ADM 31.7 ?32.0         EMG Summary Table    Spontaneous MUAP Recruitment  Muscle IA Fib PSW Fasc Other Amp Dur. Poly Pattern  R. Tibialis anterior Increased 1+ None None _______ Increased Increased 1+ Reduced  R. Tibialis posterior Increased 1+ None None _______  Increased Increased 1+ Reduced  R. Peroneus longus Increased 1+ None None _______ Increased Increased 1+ Reduced  R. Gastrocnemius (Medial head) Increased 1+ None None _______ Increased Increased 1+ Reduced  R. Vastus lateralis Increased None None None _______ Increased Increased Normal Reduced  L. Tibialis posterior Increased 1+ None None _______ Increased Increased 1+ Reduced  L. Tibialis anterior Increased None None None _______ Increased Increased 1+ Reduced  L. Peroneus longus Increased 1+ None None _______ Increased Increased 1+ Reduced  L. Vastus lateralis Increased None None None _______ Increased Increased 1+ Reduced  L. Gastrocnemius (Medial head) Increased None None None _______ Increased Increased 1+ Reduced  R. Lumbar paraspinals (low) Normal None None None _______ Increased Increased Normal Normal  R. Lumbar paraspinals (mid) Normal None None None _______ Increased Increased Normal Normal  L. Lumbar paraspinals (low) Normal None None None _______ Increased Increased Normal Normal  L. Lumbar paraspinals (mid) Normal None None None _______ Increased Increased Normal Normal  R. First dorsal interosseous Increased None None None _______ Increased Increased Normal Reduced  R. Pronator teres Increased None None None _______ Normal Increased Normal Reduced  R. Biceps brachii Normal None None None _______ Normal Normal Normal Reduced  R. Deltoid Increased None None None _______ Normal Normal Normal Reduced  R. Cervical paraspinals Normal None None None _______ Decreased Normal Normal Normal

## 2020-04-08 ENCOUNTER — Other Ambulatory Visit: Payer: Medicare Other

## 2020-04-14 MED FILL — PANTOPRAZOLE SOD DR 40 MG T: 40 | 30 days supply | Qty: 30 | Fill #2

## 2020-04-14 MED FILL — ELIQUIS 5 MG TABLET: 5 | 30 days supply | Qty: 60 | Fill #2

## 2020-04-14 MED FILL — SYMBICORT 160-4.5 MCG INH: 160-4.5 | 30 days supply | Qty: 10 | Fill #4

## 2020-04-14 MED FILL — ISOSORBIDE MN ER 30 MG TAB: 30 | 30 days supply | Qty: 15 | Fill #3

## 2020-04-19 ENCOUNTER — Ambulatory Visit: Payer: Medicare Other

## 2020-04-19 NOTE — Progress Notes (Deleted)
Patient ID: [name]                 DOB: 1960/11/18                    MRN: [id number]     HPI:  [name] is a 60 y.o. male patient referred to lipid clinic by Dr Margaretann Loveless. PMH is significant for hyperlipidemia, DVT/PE, COPD, NSTEMI, hypertension, and intolerance to atorvastatin therapy  Current Medications:  Rosuvastatin 5mg  daily  Intolerances: Atorvastatin    LDL goal: < 70mg /dL  Diet:   Exercise:   Family History: The patient's family history includes Allergies in his brother, mother, and sister; Asthma in his mother; Coronary artery disease in his father; Deep vein thrombosis in his brother; Heart attack in his father. There is no history of Colon cancer, Liver cancer, Esophageal cancer, Colon polyps, Rectal cancer, Pancreatic cancer, or Stomach cancer.  Social History:  Current smoker, reports 6 alcoholic drink per week  Labs:  Past Medical History:  Diagnosis Date  . Asthma   . CAD (coronary artery disease)    a. 09/2013 NSTEMI/PCI: LM nl, LAD 40-50%, D1 100 (2.25 x 28 Vision BMS), LCX min irregs, RI 60-70, 30, RCA 40-50/50-80ms/p, EF 45-50%.  b. cath 12/2014 -occulded BMS in diag, 50% pro to mid LAD, 60% ramus, 30% RCA   . Chest pain 08/2015  . Chronic diastolic CHF (congestive heart failure) ([place])    a. 09/2014 EF 45-50% by LV gram;  b. 01/2014 Echo: EF 55-60%, Gr 1 DD.  Marland Kitchen Cocaine abuse ([place])   . COPD (chronic obstructive pulmonary disease) ([place])   . DVT (deep venous thrombosis) ([place])    "I had ~ 10 in each leg" (12/20/2016)  . Essential hypertension   . Hepatitis C    "clear free over a year now"  . High cholesterol   . Myocardial infarction ([place]) ~ 2014  . Osteoarthritis    a. hands and toes.  . Pneumonia    "several times" (12/20/2016)  . Pulmonary embolism ([place])    a. 2012 - s/p IVC filter;  b. prev on eliquis - noncompliant.  . Shortness of breath dyspnea   . Sinus headache   . Squamous cell skin cancer 07/13/2016   "foot"  . Tobacco abuse      Current Outpatient Medications on File Prior to Visit  Medication Sig Dispense Refill  . albuterol (PROAIR HFA) 108 (90 Base) MCG/ACT inhaler Inhale 2 puffs into the lungs every 6 (six) hours as needed for wheezing or shortness of breath. 18 g 6  . albuterol (PROVENTIL) (2.5 MG/3ML) 0.083% nebulizer solution Take 3 mLs (2.5 mg total) by nebulization every 6 (six) hours as needed for wheezing or shortness of breath. 75 mL 3  . apixaban (ELIQUIS) 5 MG TABS tablet Take 1 tablet (5 mg total) by mouth 2 (two) times daily. 60 tablet 6  . cetirizine (ZYRTEC) 10 MG tablet Take 1 tablet (10 mg total) by mouth daily. 30 tablet 3  . Evolocumab (REPATHA SURECLICK) 242 MG/ML SOAJ Inject 140 mg into the skin every 14 (fourteen) days. 2 mL 11  . fluticasone (FLONASE) 50 MCG/ACT nasal spray Place 2 sprays into both nostrils 2 (two) times daily. 16 g 2  . furosemide (LASIX) 40 MG tablet Take 1 tablet (40 mg total) by mouth daily. 30 tablet 6  . gabapentin (NEURONTIN) 300 MG capsule TAKE 1 CAPSULE (300 MG TOTAL) BY MOUTH 2 (TWO) TIMES DAILY.  60 capsule 6  . HYDROcodone-acetaminophen (NORCO/VICODIN) 5-325 MG tablet Take 1-2 tablets by mouth every 6 (six) hours as needed. 10 tablet 0  . isosorbide mononitrate (IMDUR) 30 MG 24 hr tablet Take 1 tablet (30 mg total) by mouth daily. 30 tablet 1  . metoprolol succinate (TOPROL XL) 25 MG 24 hr tablet Take 1 tablet (25 mg total) by mouth daily. 90 tablet 3  . nitroGLYCERIN (NITROSTAT) 0.4 MG SL tablet Place 1 tablet (0.4 mg total) under the tongue every 5 (five) minutes as needed for chest pain. 25 tablet 3  . pantoprazole (PROTONIX) 40 MG tablet TAKE 1 TABLET (40 MG TOTAL) BY MOUTH DAILY. 30 tablet 3  . predniSONE (STERAPRED UNI-PAK 21 TAB) 10 MG (21) TBPK tablet Take by mouth daily. Take 6 tabs by mouth daily  for 2 days, then 5 tabs for 2 days, then 4 tabs for 2 days, then 3 tabs for 2 days, 2 tabs for 2 days, then 1 tab by mouth daily for 2 days 42 tablet 0  .  SYMBICORT 160-4.5 MCG/ACT inhaler Inhale 2 puffs into the lungs 2 (two) times daily.    Marland Kitchen tiZANidine (ZANAFLEX) 4 MG tablet Take 1 tablet (4 mg total) by mouth every 8 (eight) hours as needed for muscle spasms. 60 tablet 2  . vitamin B-12 (CYANOCOBALAMIN) 1000 MCG tablet Take 1 tablet (1,000 mcg total) by mouth daily. 30 tablet 5   No current facility-administered medications on file prior to visit.    Allergies  Allergen Reactions  . Atorvastatin     myalgia    No problem-specific Assessment & Plan notes found for this encounter.    Jermia Rigsby Rodriguez-Guzman PharmD, BCPS, [place] [address] [place] [address] 04/19/2020 10:14 AM

## 2020-04-21 ENCOUNTER — Telehealth: Payer: Self-pay

## 2020-04-21 NOTE — Telephone Encounter (Signed)
Spoke with patient regarding need for repeat blood work (Lipid Panel) since he has completed his 4th dose of Repatha. Patient states he was not able to come to his CV[facility] appointment on 1/24 and would like to get this rescheduled.  Patient states he will be able to come in and get his labs drawn next week.   Sent message to scheduling to get patient a follow up with CV[facility].

## 2020-04-23 ENCOUNTER — Telehealth: Payer: Self-pay | Admitting: Neurology

## 2020-04-23 ENCOUNTER — Ambulatory Visit
Admission: RE | Admit: 2020-04-23 | Discharge: 2020-04-23 | Disposition: A | Discharge status: Home / Self Care | Payer: Medicare Other | Source: Ambulatory Visit | Attending: Neurology | Admitting: Neurology

## 2020-04-23 DIAGNOSIS — R531 Weakness: Secondary | ICD-10-CM

## 2020-04-23 DIAGNOSIS — R269 Unspecified abnormalities of gait and mobility: Secondary | ICD-10-CM | POA: Diagnosis not present

## 2020-04-23 DIAGNOSIS — G8929 Other chronic pain: Secondary | ICD-10-CM

## 2020-04-23 DIAGNOSIS — M5441 Lumbago with sciatica, right side: Secondary | ICD-10-CM

## 2020-04-23 NOTE — Telephone Encounter (Signed)
  IMPRESSION: Abnormal MRI scan lumbar spine showing prominent spondylitic and facet degenerative changes from L3-S1 most prominent at L5-S1 there is broad-based disc osteophyte protrusion and ligamentum flavum hypertrophy resulting in mild canal and severe right-sided and moderate left-sided foraminal narrowing.  There is moderate foraminal narrowing on the left at L4/5 as well.   IMPRESSION: Abnormal MRI scan cervical spine without contrast showing prominent broad-based central disc bulge at C3-4 as well as posterior ligamentum flavum hypertrophy resulting in moderate canal and bilateral foraminal narrowing.  There also mild disc degenerative changes at C4-5 and C5-6 7 again mild canal and bilateral foraminal narrowing.  Please call patient, MRI of cervical spine showed evidence of multilevel degenerative changes, most prominent is at C3-4, evidence of moderate canal stenosis, but no cord signal abnormality, variable degree of foraminal narrowing at other levels  MRI of lumbar spine also showed prominent spondylitic changes, most prominent at L5-S1, with evidence of moderate left, severe right foraminal stenosis, mild canal stenosis  Please give him a follow-up appointment with me at my next available

## 2020-04-24 NOTE — Telephone Encounter (Signed)
I spoke to the patient and provided him with the MRI results below. He has been scheduled on 04/28/20 w/ Dr. Krista Blue for further review of the findings.

## 2020-04-28 ENCOUNTER — Other Ambulatory Visit: Payer: Self-pay

## 2020-04-28 ENCOUNTER — Encounter: Payer: Self-pay | Admitting: Neurology

## 2020-04-28 ENCOUNTER — Ambulatory Visit (INDEPENDENT_AMBULATORY_CARE_PROVIDER_SITE_OTHER): Payer: Medicare Other | Admitting: Neurology

## 2020-04-28 VITALS — BP 138/90 | HR 75 | Ht 74.0 in | Wt 219.0 lb

## 2020-04-28 DIAGNOSIS — R531 Weakness: Secondary | ICD-10-CM

## 2020-04-28 DIAGNOSIS — R269 Unspecified abnormalities of gait and mobility: Secondary | ICD-10-CM

## 2020-04-28 DIAGNOSIS — G629 Polyneuropathy, unspecified: Secondary | ICD-10-CM

## 2020-04-28 NOTE — Patient Instructions (Signed)
[facility]  Address: [address] [place], [place], [place] [address]  Phone: [phone]

## 2020-04-28 NOTE — Progress Notes (Addendum)
Chief Complaint  Patient presents with  . Follow-up    Gait abnormality/back pain. He is here to further review his MRI results.    HISTORICAL  [name] is a 60 year old male, seen in request by his primary care physician Dr. Margarita Rana, [name] for evaluation of lower extremity weakness, gait abnormality, initial evaluation was on March 12, 2020.  I reviewed and summarized the referring note. PMHX. HTN Asthma CAD, DVT since 2014, and PE. Smoker,  2 cigarette this week  He reported more than 10 years history of bilateral lower extremity difficulty, he is a poor historian, reported without clear triggers, he began to notice difficulty getting up from seated position, he has to push on chair arms to get up from seated position, slight worsening of the past 10 years, he gave me an example, he was working the yard, fell to the ground, even with his friend's help, he could not get up from low seated position, he has to climb to the front staff to pull himself up, he denies significant upper extremity weakness  He does complains of slow worsening low back pain, especially on the right side, radiating pain to right lower extremity, also complains of bilateral feet and lower extremity paresthesia, numbness, also involving left calf,  He complains some neck pain, denies radiating pain, denies numbness tingling at his feet, denies bowel bladder incontinence, he denies double vision, no chewing or swallowing difficulty.  He reported a long history of recurrent bilateral lower extremity DVT, PE, went on disability more than 12 years ago.  Lab in Nov 2021, Lipid Panel, LDL 162, cholesterol 231, Methylmalonic acid level 763, BMP, Cal 8.6, Hg 10.3, ferritin 42, B12 259,  HIV -  UPDATE Apr 28 2020: Patient insists that he had difficulty getting up from seated position for about 10 years, remember he has to push on chair arm to get up from seated position, gradually getting worse, if he fell to  the ground, it is very difficult for him to get up, even with the help,  Personally reviewed MRI of cervical and lumbar spine January 2022: Cervical spine showed multilevel degenerative changes, most obvious at C3-4, central disc bulging, posterior ligament foramen hypertrophy, with moderate canal bilateral foraminal narrowing, MRI of lumbar spine prominent spondylitic and facet degenerative changes from L3-S1, most prominent at L5-S1, broad based disc osteophyte protrusion, and ligament flavum and hypertrophy, with mild canal, severe right side moderate left-sided foraminal narrowing,  REVIEW OF SYSTEMS: Full 14 system review of systems performed and notable only for as above All other review of systems were negative.  ALLERGIES: Allergies  Allergen Reactions  . Atorvastatin     myalgia    HOME MEDICATIONS: Current Outpatient Medications  Medication Sig Dispense Refill  . albuterol (PROAIR HFA) 108 (90 Base) MCG/ACT inhaler Inhale 2 puffs into the lungs every 6 (six) hours as needed for wheezing or shortness of breath. 18 g 6  . albuterol (PROVENTIL) (2.5 MG/3ML) 0.083% nebulizer solution Take 3 mLs (2.5 mg total) by nebulization every 6 (six) hours as needed for wheezing or shortness of breath. 75 mL 3  . apixaban (ELIQUIS) 5 MG TABS tablet Take 1 tablet (5 mg total) by mouth 2 (two) times daily. 60 tablet 6  . cetirizine (ZYRTEC) 10 MG tablet Take 1 tablet (10 mg total) by mouth daily. 30 tablet 3  . Evolocumab (REPATHA SURECLICK) [id number] MG/ML SOAJ Inject 140 mg into the skin every 14 (fourteen) days. 2 mL 11  .  fluticasone (FLONASE) 50 MCG/ACT nasal spray Place 2 sprays into both nostrils 2 (two) times daily. 16 g 2  . furosemide (LASIX) 40 MG tablet Take 1 tablet (40 mg total) by mouth daily. 30 tablet 6  . gabapentin (NEURONTIN) 300 MG capsule TAKE 1 CAPSULE (300 MG TOTAL) BY MOUTH 2 (TWO) TIMES DAILY. 60 capsule 6  . HYDROcodone-acetaminophen (NORCO/VICODIN) 5-325 MG tablet Take 1-2  tablets by mouth every 6 (six) hours as needed. 10 tablet 0  . isosorbide mononitrate (IMDUR) 30 MG 24 hr tablet Take 1 tablet (30 mg total) by mouth daily. 30 tablet 1  . metoprolol succinate (TOPROL XL) 25 MG 24 hr tablet Take 1 tablet (25 mg total) by mouth daily. 90 tablet 3  . nitroGLYCERIN (NITROSTAT) 0.4 MG SL tablet Place 1 tablet (0.4 mg total) under the tongue every 5 (five) minutes as needed for chest pain. 25 tablet 3  . pantoprazole (PROTONIX) 40 MG tablet TAKE 1 TABLET (40 MG TOTAL) BY MOUTH DAILY. 30 tablet 3  . SYMBICORT 160-4.5 MCG/ACT inhaler Inhale 2 puffs into the lungs 2 (two) times daily.    Marland Kitchen tiZANidine (ZANAFLEX) 4 MG tablet Take 1 tablet (4 mg total) by mouth every 8 (eight) hours as needed for muscle spasms. 60 tablet 2  . vitamin B-12 (CYANOCOBALAMIN) 1000 MCG tablet Take 1 tablet (1,000 mcg total) by mouth daily. 30 tablet 5   No current facility-administered medications for this visit.    PAST MEDICAL HISTORY: Past Medical History:  Diagnosis Date  . Asthma   . CAD (coronary artery disease)    a. 09/2013 NSTEMI/PCI: LM nl, LAD 40-50%, D1 100 (2.25 x 28 Vision BMS), LCX min irregs, RI 60-70, 30, RCA 40-50/50-87ms/p, EF 45-50%.  b. cath 12/2014 -occulded BMS in diag, 50% pro to mid LAD, 60% ramus, 30% RCA   . Chest pain 08/2015  . Chronic diastolic CHF (congestive heart failure) ([place])    a. 09/2014 EF 45-50% by LV gram;  b. 01/2014 Echo: EF 55-60%, Gr 1 DD.  Marland Kitchen Cocaine abuse ([place])   . COPD (chronic obstructive pulmonary disease) ([place])   . DVT (deep venous thrombosis) ([place])    "I had ~ 10 in each leg" (12/20/2016)  . Essential hypertension   . Hepatitis C    "clear free over a year now"  . High cholesterol   . Myocardial infarction ([place]) ~ 2014  . Osteoarthritis    a. hands and toes.  . Pneumonia    "several times" (12/20/2016)  . Pulmonary embolism ([place])    a. 2012 - s/p IVC filter;  b. prev on eliquis - noncompliant.  . Shortness of breath dyspnea   . Sinus  headache   . Squamous cell skin cancer 07/13/2016   "foot"  . Tobacco abuse     PAST SURGICAL HISTORY: Past Surgical History:  Procedure Laterality Date  . CARDIAC CATHETERIZATION N/A 01/04/2015   Procedure: Left Heart Cath and Coronary Angiography;  Surgeon: Burnell Blanks, MD;  Location: [facility];  Service: Cardiovascular;  Laterality: N/A;  . CORONARY ANGIOPLASTY WITH STENT PLACEMENT  10/21/13   BMS to D1  . CYST EXCISION N/A 05/10/2016   Procedure: EXCISION OF SEBACEOUS CYST UPPER BACK;  Surgeon: Donnie Mesa, MD;  Location: [facility];  Service: General;  Laterality: N/A;  . FOOT SURGERY Right ~ 06/2016   "said there was skin cancer on it"  . LEFT HEART CATHETERIZATION WITH CORONARY ANGIOGRAM N/A 10/21/2013   Procedure: LEFT  HEART CATHETERIZATION WITH CORONARY ANGIOGRAM;  Surgeon: Leonie Man, MD;  Location[facility];  Service: Cardiovascular;  Laterality: N/A;  . VENA CAVA FILTER PLACEMENT  ~ 2007-~ 2017   "1 in my neck; 2 in my wrist"    FAMILY HISTORY: Family History  Problem Relation Age of Onset  . Asthma Mother   . Allergies Mother   . Allergies Sister   . Allergies Brother   . Deep vein thrombosis Brother        two brothers with recurrent DVT  . Heart attack Father        a.60s b. deceased in his 45s  . Coronary artery disease Father   . Colon cancer Neg Hx   . Liver cancer Neg Hx   . Esophageal cancer Neg Hx   . Colon polyps Neg Hx   . Rectal cancer Neg Hx   . Pancreatic cancer Neg Hx   . Stomach cancer Neg Hx     SOCIAL HISTORY: Social History   Socioeconomic History  . Marital status: Single    Spouse name: Not on file  . Number of children: Not on file  . Years of education: Not on file  . Highest education level: Not on file  Occupational History  . Occupation: unemployed  Tobacco Use  . Smoking status: Current Every Day Smoker    Packs/day: 0.25    Years: 39.00    Pack years: 9.75    Types: Cigarettes, Cigars  . Smokeless  tobacco: Never Used  Vaping Use  . Vaping Use: Never used  Substance and Sexual Activity  . Alcohol use: Yes    Alcohol/week: 6.0 standard drinks    Types: 2 Cans of beer, 4 Shots of liquor per week  . Drug use: Not Currently    Types: Cocaine    Comment: 12/20/2016 "might have used some the other day; I'm not sure"  . Sexual activity: Not Currently  Other Topics Concern  . Not on file  Social History Narrative   Lives in [place].   Social Determinants of Health   Financial Resource Strain: Not on file  Food Insecurity: Not on file  Transportation Needs: Not on file  Physical Activity: Not on file  Stress: Not on file  Social Connections: Not on file  Intimate Partner Violence: Not on file     PHYSICAL EXAM   Vitals:   04/28/20 0956  BP: 138/90  Pulse: 75  Weight: 219 lb (99.3 kg)  Height: 6\' 2"  (1.88 m)   Not recorded     Body mass index is 28.12 kg/m.  PHYSICAL EXAMNIATION:  Gen: NAD, conversant, well nourised, well groomed                     Cardiovascular: Regular rate rhythm, no peripheral edema, warm, nontender. Eyes: Conjunctivae clear without exudates or hemorrhage Neck: Supple, no carotid bruits. Pulmonary: Clear to auscultation bilaterally   NEUROLOGICAL EXAM:  MENTAL STATUS: Speech/cognition: Awake, alert, oriented to history taking and care of conversation   CRANIAL NERVES: CN II: Visual fields are full to confrontation. Pupils are round equal and briskly reactive to light. CN III, IV, VI: extraocular movement are normal. No ptosis. CN V: Facial sensation is intact to light touch CN VII: Face is symmetric with normal eye closure  CN VIII: Hearing is normal to causal conversation. CN IX, X: Phonation is normal. CN XI: Head turning and shoulder shrug are intact  MOTOR: He has mild  shoulder abduction, external rotation, mild elbow flexion, elbow extension weakness.  He has mild to moderate hip flexion weakness.  REFLEXES: Reflexes are 2+ and  symmetric at the biceps, triceps, knees, and ankles. Plantar responses are extensor bilaterally  SENSORY: Intact to light touch, pinprick and vibratory sensation are intact in fingers and toes.  COORDINATION: There is no trunk or limb dysmetria noted.  GAIT/STANCE: He needs to push on chair arm to get up from seated position, wide-based, stiff, mildly unsteady gait  DIAGNOSTIC DATA (LABS, IMAGING, TESTING) - I reviewed patient records, labs, notes, testing and imaging myself where available.   ASSESSMENT AND PLAN  JALAL RAUCH is a 60 y.o. male   Gait abnormality Chronic low back pain  He has mild bilateral upper extremity proximal muscle weakness, mild to moderate bilateral lower extremity proximal muscle weakness, hyperreflexia of bilateral upper extremity lower extremity muscles, with bilateral Babinski signs, wide-based, unsteady gait,  MRI of cervical spine showed moderate C3-4 spinal canal stenosis, multilevel lumbar degenerative changes, L5-S1 with severe right, moderate left foraminal narrowing,  Above imaging findings likely explain his progressive worsening gait abnormality  Will refer him to neurosurgeon for potential cervical decompression   Marcial Pacas, M.D. Ph.D.  [facility] [address] [place], [place] [address] Ph: [phone] Fax: [phone]  CC:  Charlott Rakes, MD [place],  [place] [address]  Addendum: Evaluation by neurosurgeon Dr. Deri Fuelling on April 30, 2020, cervical spine demonstrate significant multilevel cervical degeneration with moderate spinal stenosis, early cord compression at C3-4, no cord signal abnormality, MRI of the lumbar demonstrate progressive disc degeneration with central disc bulging at L5-S1, he seems to be primarily symptomatic secondary to his low back pain with intermittent radicular symptoms, refer to epidural steroid injection, monitor his symptoms for worsening cervical  myelopathy

## 2020-05-04 ENCOUNTER — Other Ambulatory Visit: Payer: Self-pay

## 2020-05-04 ENCOUNTER — Ambulatory Visit (INDEPENDENT_AMBULATORY_CARE_PROVIDER_SITE_OTHER): Payer: Medicare Other | Admitting: Pharmacist Clinician (PhC)/ Clinical Pharmacy Specialist

## 2020-05-04 VITALS — BP 132/86 | HR 73 | Resp 16 | Ht 74.0 in | Wt 217.2 lb

## 2020-05-04 DIAGNOSIS — T466X5A Adverse effect of antihyperlipidemic and antiarteriosclerotic drugs, initial encounter: Secondary | ICD-10-CM

## 2020-05-04 DIAGNOSIS — E785 Hyperlipidemia, unspecified: Secondary | ICD-10-CM

## 2020-05-04 DIAGNOSIS — G720 Drug-induced myopathy: Secondary | ICD-10-CM

## 2020-05-04 NOTE — Patient Instructions (Addendum)
Your Results:             Your most recent labs Goal  Total Cholesterol 231 < 200  Triglycerides 90 < 150  HDL (happy/good cholesterol) 53 > 40  LDL (lousy/bad cholesterol 162 < 70    Medication changes:  Continue with Repatha injections.  When we get the results, if the LDL is not < 70 we will add ezetimibe 10 mg daily to your medications.    Lab orders:  Get labs today to check cholesterol levels.  We will call you Thursday with the results.    Thank you for choosing [facility]

## 2020-05-04 NOTE — Progress Notes (Signed)
05/04/2020 [name] Aug 19, 1960 [id number]   HPI:  [name] is a 60 y.o. male patient of Dr Margaretann Loveless, who presents today for a lipid clinic evaluation.  See pertinent past medical history below.  He was seen by Dr. Margaretann Loveless in November and started on [place] for his elevated cholesterol levels.  He was unable to tolerate atorvastatin due to leg pains after having taken this for several years.    He has taken the [place] for just over 2 months now and reports no issues with injection or side effects from the medication.  His last dose was about 2 weeks ago.  Based on previous labs he will need a 60% reduction to achieve goal LDL level.  He has not had labs drawn since starting medication, so will get those done today.  His last dose of Repatha was 6- days ago.  Past Medical History: ASCVD 09/2013: NSTEMI w/BMS to D1, now chronically occluded  hyeprtension Controlled on   CHF Chronic diastolic (EF 08-14% by echo 2016)  PE/DVT IVC filter placed 2012    Current Medications: repatha Sureclick 481 mg E56D,   Cholesterol Goals: LDL < 70   Intolerant/previously tried: rosuvastatin, atorvastatin - myalgias  Social history: Some alcohol, not daily; smokes 5-6 cigarettes per day at most, 1/3-1/4 ppd; no coffee, tea  Family history:  Father had pacemaker; died in his late 71's; mother died at asthma/breathing issues; siblings without heart issues; no children  Diet: cooks mostly from scratch, some prepared stuff, some salt added; no fish, but all other meats; not much for vegetables; drinks Kool Aid and water   Exercise:  No regular exercise, tires easily; having back pain/neck issues, seeing neurology now  Labs: 11/21: TC 231, TG 90, HDL 53, LDL 162   Current Outpatient Medications  Medication Sig Dispense Refill  . albuterol (PROAIR HFA) 108 (90 Base) MCG/ACT inhaler Inhale 2 puffs into the lungs every 6 (six) hours as needed for wheezing or shortness of breath. 18 g 6   . albuterol (PROVENTIL) (2.5 MG/3ML) 0.083% nebulizer solution Take 3 mLs (2.5 mg total) by nebulization every 6 (six) hours as needed for wheezing or shortness of breath. 75 mL 3  . apixaban (ELIQUIS) 5 MG TABS tablet Take 1 tablet (5 mg total) by mouth 2 (two) times daily. 60 tablet 6  . cetirizine (ZYRTEC) 10 MG tablet Take 1 tablet (10 mg total) by mouth daily. 30 tablet 3  . Evolocumab (REPATHA SURECLICK) 149 MG/ML SOAJ Inject 140 mg into the skin every 14 (fourteen) days. 2 mL 11  . fluticasone (FLONASE) 50 MCG/ACT nasal spray Place 2 sprays into both nostrils 2 (two) times daily. 16 g 2  . furosemide (LASIX) 40 MG tablet Take 1 tablet (40 mg total) by mouth daily. 30 tablet 6  . gabapentin (NEURONTIN) 300 MG capsule TAKE 1 CAPSULE (300 MG TOTAL) BY MOUTH 2 (TWO) TIMES DAILY. 60 capsule 6  . HYDROcodone-acetaminophen (NORCO/VICODIN) 5-325 MG tablet Take 1-2 tablets by mouth every 6 (six) hours as needed. 10 tablet 0  . isosorbide mononitrate (IMDUR) 30 MG 24 hr tablet Take 1 tablet (30 mg total) by mouth daily. 30 tablet 1  . metoprolol succinate (TOPROL XL) 25 MG 24 hr tablet Take 1 tablet (25 mg total) by mouth daily. 90 tablet 3  . nitroGLYCERIN (NITROSTAT) 0.4 MG SL tablet Place 1 tablet (0.4 mg total) under the tongue every 5 (five) minutes as needed for chest pain. 25 tablet 3  .  pantoprazole (PROTONIX) 40 MG tablet TAKE 1 TABLET (40 MG TOTAL) BY MOUTH DAILY. 30 tablet 3  . SYMBICORT 160-4.5 MCG/ACT inhaler Inhale 2 puffs into the lungs 2 (two) times daily.    Marland Kitchen tiZANidine (ZANAFLEX) 4 MG tablet Take 1 tablet (4 mg total) by mouth every 8 (eight) hours as needed for muscle spasms. 60 tablet 2  . vitamin B-12 (CYANOCOBALAMIN) 1000 MCG tablet Take 1 tablet (1,000 mcg total) by mouth daily. 30 tablet 5   No current facility-administered medications for this visit.    Allergies  Allergen Reactions  . Atorvastatin     myalgia    Past Medical History:  Diagnosis Date  . Asthma   .  CAD (coronary artery disease)    a. 09/2013 NSTEMI/PCI: LM nl, LAD 40-50%, D1 100 (2.25 x 28 Vision BMS), LCX min irregs, RI 60-70, 30, RCA 40-50/50-83ms/p, EF 45-50%.  b. cath 12/2014 -occulded BMS in diag, 50% pro to mid LAD, 60% ramus, 30% RCA   . Chest pain 08/2015  . Chronic diastolic CHF (congestive heart failure) ([place])    a. 09/2014 EF 45-50% by LV gram;  b. 01/2014 Echo: EF 55-60%, Gr 1 DD.  Marland Kitchen Cocaine abuse ([place])   . COPD (chronic obstructive pulmonary disease) ([place])   . DVT (deep venous thrombosis) ([place])    "I had ~ 10 in each leg" (12/20/2016)  . Essential hypertension   . Hepatitis C    "clear free over a year now"  . High cholesterol   . Myocardial infarction ([place]) ~ 2014  . Osteoarthritis    a. hands and toes.  . Pneumonia    "several times" (12/20/2016)  . Pulmonary embolism ([place])    a. 2012 - s/p IVC filter;  b. prev on eliquis - noncompliant.  . Shortness of breath dyspnea   . Sinus headache   . Squamous cell skin cancer 07/13/2016   "foot"  . Tobacco abuse     Blood pressure 132/86, pulse 73, resp. rate 16, height 6\' 2"  (1.88 m), weight 217 lb 3.2 oz (98.5 kg), SpO2 97 %.   Hyperlipidemia Patient with ASCVD and hyperlipidemia, last labs drawn before start of Repatha.  Will repeat labs today.  If patient not at goal with just this, will probably need to add ezetmibe 10 mg daily.  Reviewed information on ezetimibe including mechanism of action, side effects and expected drop in LDL cholesterol.  Patient agreeable if this is needed. Will call him with results once received from lab.  Tommy Medal PharmD CPP [facility] [address] [place], [place] [address] [phone]

## 2020-05-04 NOTE — Assessment & Plan Note (Signed)
Patient with ASCVD and hyperlipidemia, last labs drawn before start of Repatha.  Will repeat labs today.  If patient not at goal with just this, will probably need to add ezetmibe 10 mg daily.  Reviewed information on ezetimibe including mechanism of action, side effects and expected drop in LDL cholesterol.  Patient agreeable if this is needed. Will call him with results once received from lab.

## 2020-05-05 LAB — HEPATIC FUNCTION PANEL
ALT: 19 IU/L (ref 0–44)
AST: 22 IU/L (ref 0–40)
Albumin: 4.1 g/dL (ref 3.8–4.9)
Alkaline Phosphatase: 122 IU/L — ABNORMAL HIGH (ref 44–121)
Bilirubin Total: 0.5 mg/dL (ref 0.0–1.2)
Bilirubin, Direct: 0.14 mg/dL (ref 0.00–0.40)
Total Protein: 7 g/dL (ref 6.0–8.5)

## 2020-05-05 LAB — LIPID PANEL
Chol/HDL Ratio: 2.8 ratio (ref 0.0–5.0)
Cholesterol, Total: 169 mg/dL (ref 100–199)
HDL: 60 mg/dL (ref >39)
LDL Chol Calc (NIH): 91 mg/dL (ref 0–99)
Triglycerides: 102 mg/dL (ref 0–149)
VLDL Cholesterol Cal: 18 mg/dL (ref 5–40)

## 2020-05-06 ENCOUNTER — Other Ambulatory Visit: Payer: Self-pay

## 2020-05-06 ENCOUNTER — Other Ambulatory Visit: Payer: Self-pay | Admitting: Pharmacist Clinician (PhC)/ Clinical Pharmacy Specialist

## 2020-05-06 MED ORDER — EZETIMIBE 10 MG PO TABS: 10.0000 mg | ORAL_TABLET | Freq: Every day | ORAL | 3 refills | Status: DC

## 2020-05-06 MED FILL — EZETIMIBE 10 MG TABS: 10 | 90 days supply | Qty: 90 | Fill #0

## 2020-05-18 MED FILL — REPATHA SURECLICK 140 MG/ML: 140 | 28 days supply | Qty: 2 | Fill #3

## 2020-05-18 MED FILL — TRELEGY ELLIPTA 100-62.5-25: 100-62.5-25 | 30 days supply | Qty: 60 | Fill #3

## 2020-05-18 MED FILL — EZETIMIBE 10 MG TABS: 10 | 90 days supply | Qty: 90 | Fill #0

## 2020-05-20 DIAGNOSIS — M5416 Radiculopathy, lumbar region: Secondary | ICD-10-CM | POA: Diagnosis not present

## 2020-05-20 DIAGNOSIS — I10 Essential (primary) hypertension: Secondary | ICD-10-CM | POA: Diagnosis not present

## 2020-05-31 ENCOUNTER — Ambulatory Visit: Payer: Medicare Other | Admitting: Family Medicine

## 2020-05-31 DIAGNOSIS — M5416 Radiculopathy, lumbar region: Secondary | ICD-10-CM | POA: Diagnosis not present

## 2020-05-31 DIAGNOSIS — M48062 Spinal stenosis, lumbar region with neurogenic claudication: Secondary | ICD-10-CM | POA: Diagnosis not present

## 2020-06-11 DIAGNOSIS — M5416 Radiculopathy, lumbar region: Secondary | ICD-10-CM | POA: Diagnosis not present

## 2020-06-26 ENCOUNTER — Other Ambulatory Visit: Payer: Self-pay

## 2020-07-05 ENCOUNTER — Telehealth: Payer: Self-pay | Admitting: Pharmacist Clinician (PhC)/ Clinical Pharmacy Specialist

## 2020-07-05 DIAGNOSIS — E785 Hyperlipidemia, unspecified: Secondary | ICD-10-CM

## 2020-07-05 NOTE — Telephone Encounter (Signed)
Follow up labs

## 2020-07-08 ENCOUNTER — Ambulatory Visit: Payer: Medicare Other | Admitting: Family Medicine

## 2020-07-12 ENCOUNTER — Other Ambulatory Visit: Payer: Self-pay | Admitting: Internal Medicine

## 2020-07-12 ENCOUNTER — Other Ambulatory Visit: Payer: Self-pay

## 2020-07-12 ENCOUNTER — Other Ambulatory Visit: Payer: Self-pay | Admitting: Family Medicine

## 2020-07-12 ENCOUNTER — Other Ambulatory Visit: Payer: Self-pay | Admitting: Student

## 2020-07-12 MED ORDER — PANTOPRAZOLE SODIUM 40 MG PO TBEC
DELAYED_RELEASE_TABLET | Freq: Every day | ORAL | 0 refills | Status: DC
  Filled 2020-07-12: qty 30, 30d supply, fill #0

## 2020-07-12 MED FILL — Apixaban Tab 5 MG: ORAL | 90 days supply | Qty: 180 | Fill #0 | Status: AC

## 2020-07-12 MED FILL — Fluticasone-Umeclidinium-Vilanterol AEPB 100-62.5-25 MCG/ACT: RESPIRATORY_TRACT | 90 days supply | Qty: 180 | Fill #0 | Status: AC

## 2020-07-12 MED FILL — Tizanidine HCl Tab 4 MG (Base Equivalent): ORAL | 40 days supply | Qty: 120 | Fill #0 | Status: AC

## 2020-07-12 MED FILL — Evolocumab Subcutaneous Soln Auto-Injector 140 MG/ML: SUBCUTANEOUS | 84 days supply | Qty: 6 | Fill #0 | Status: AC

## 2020-07-12 MED FILL — Isosorbide Mononitrate Tab ER 24HR 30 MG: ORAL | 60 days supply | Qty: 30 | Fill #0 | Status: AC

## 2020-07-13 ENCOUNTER — Telehealth: Payer: Self-pay | Admitting: Pharmacist

## 2020-07-13 ENCOUNTER — Other Ambulatory Visit: Payer: Self-pay

## 2020-07-13 DIAGNOSIS — M5416 Radiculopathy, lumbar region: Secondary | ICD-10-CM | POA: Diagnosis not present

## 2020-07-13 DIAGNOSIS — Z789 Other specified health status: Secondary | ICD-10-CM

## 2020-07-13 NOTE — Progress Notes (Signed)
[facility])                                            [place] Team                                        Statin Quality Measure Assessment    07/13/2020  [name] [birth year] [id number]    I am a [facility] clinical pharmacist that reviews patients for statin quality initiatives.     Per review of chart and payor information, patient has a diagnosis of cardiovascular disease but is not currently filling a statin prescription.  This places patient into the [facility] (Statin Use In Patients with Cardiovascular Disease) measure for CMS.    He has documented statin intolerance and can be removed from the measure if a statin exclusion code is associated with a provider visit.  Patient had a trial of Repatha and is on Ezetimibe.        Component Value Date/Time   CHOL 169 05/04/2020 1149   TRIG 102 05/04/2020 1149   HDL 60 05/04/2020 1149   CHOLHDL 2.8 05/04/2020 1149   CHOLHDL 3.1 01/01/2015 0208   VLDL 12 01/01/2015 0208   LDLCALC 91 05/04/2020 1149     Please consider ONE of the following recommendations during the patient's next visit. ? Initiate high intensity statin Atorvastatin 40mg  once daily, #90, 3 refills   Rosuvastatin 20mg  once daily, #90, 3 refills    ? Initiate moderate intensity  statin with reduced frequency if prior  statin intolerance 1x weekly, #13, 3 refills   2x weekly, #26, 3 refills   3x weekly, #39, 3 refills    ? Code for past statin intolerance  (required annually)  Provider Requirements: Must asociate code during an office visit or telehealth encounter   Drug Induced Myopathy G72.0   Myalgia M79.1   Myositis, unspecified M60.9   Myopathy, unspecified G72.9   Rhabdomyolysis M62.82     Elayne Guerin, PharmD, [place] Clinical Pharmacist [phone]

## 2020-07-21 ENCOUNTER — Other Ambulatory Visit: Payer: Self-pay

## 2020-07-26 ENCOUNTER — Other Ambulatory Visit: Payer: Self-pay

## 2020-07-26 ENCOUNTER — Other Ambulatory Visit: Payer: Self-pay | Admitting: Internal Medicine

## 2020-07-28 ENCOUNTER — Ambulatory Visit: Payer: Medicare Other | Admitting: Neurology

## 2020-07-30 ENCOUNTER — Ambulatory Visit: Payer: Medicare Other | Admitting: Podiatry

## 2020-08-09 ENCOUNTER — Other Ambulatory Visit: Payer: Self-pay

## 2020-08-09 ENCOUNTER — Encounter: Payer: Self-pay | Admitting: Family Medicine

## 2020-08-09 ENCOUNTER — Ambulatory Visit: Payer: Medicare Other | Attending: Family Medicine | Admitting: Family Medicine

## 2020-08-09 VITALS — BP 105/67 | HR 89 | Ht 74.0 in | Wt 218.0 lb

## 2020-08-09 DIAGNOSIS — M5416 Radiculopathy, lumbar region: Secondary | ICD-10-CM

## 2020-08-09 DIAGNOSIS — Z131 Encounter for screening for diabetes mellitus: Secondary | ICD-10-CM | POA: Diagnosis not present

## 2020-08-09 DIAGNOSIS — J438 Other emphysema: Secondary | ICD-10-CM | POA: Diagnosis not present

## 2020-08-09 DIAGNOSIS — I5022 Chronic systolic (congestive) heart failure: Secondary | ICD-10-CM | POA: Diagnosis not present

## 2020-08-09 DIAGNOSIS — M48061 Spinal stenosis, lumbar region without neurogenic claudication: Secondary | ICD-10-CM | POA: Diagnosis not present

## 2020-08-09 MED ORDER — FLUTICASONE-UMECLIDIN-VILANT 100-62.5-25 MCG/INH IN AEPB
INHALATION_SPRAY | RESPIRATORY_TRACT | 6 refills | Status: DC
  Filled 2020-08-09: qty 60, fill #0
  Filled 2020-10-05: qty 180, 90d supply, fill #0

## 2020-08-09 MED ORDER — PREDNISONE 10 MG PO TABS
10.0000 mg | ORAL_TABLET | Freq: Two times a day (BID) | ORAL | 0 refills | Status: DC
  Filled 2020-08-09: qty 10, 5d supply, fill #0

## 2020-08-09 MED ORDER — GABAPENTIN 300 MG PO CAPS
300.0000 mg | ORAL_CAPSULE | Freq: Two times a day (BID) | ORAL | 6 refills | Status: DC
  Filled 2020-08-09: qty 60, 30d supply, fill #0
  Filled 2020-10-05: qty 180, 90d supply, fill #1

## 2020-08-09 MED ORDER — PANTOPRAZOLE SODIUM 40 MG PO TBEC
DELAYED_RELEASE_TABLET | Freq: Every day | ORAL | 6 refills | Status: DC
  Filled 2020-08-09: qty 30, 30d supply, fill #0
  Filled 2020-10-05: qty 90, 90d supply, fill #1

## 2020-08-09 NOTE — Progress Notes (Signed)
Has pain in right leg.

## 2020-08-09 NOTE — Progress Notes (Signed)
Subjective:  Patient ID: [name], male    DOB: Oct 17, 1960  Age: 60 y.o. MRN: [id number]  CC: COPD   HPI [name] is a 60 year old male with a history of COPD, chronic sinusitis, recurrent DVT and PE (on anticoagulation with Eliquis), coronary artery disease (status post stent, last cardiac cath from 12/2014 revealed two-vessel CAD, medical management recommended), (EF 40-45% from 11/2017),tobacco abuse who presents today for follow-up visit.  Interval History: He complains of right leg pain R lower extremity is numb and he is still weak in his b/l LE. Has to hold onto support to get up from a sitting position.  He took a fall 5 months ago due to his symptoms; after his fall he was prescribed Prednisone at the [facility] which helped. He struggles to get up when he sits on a low chair. Seen by Neurology in 04/2020 after which he was referred to Neurosurgery and he is s/p ESI with no relief.  He is still dyspneic but follows with Cardiology soon.  Denies recent weight gain, pedal edema, orthopnea, paroxysmal nocturnal dyspnea.  Seen by the lipid clinic and placed on Repatha due to intolerance to statin as he previously had myalgias with atorvastatin. Compliant with Eliquis for his chronic PE. He is adherent with his inhalers for COPD but continues to smoke. Past Medical History:  Diagnosis Date  . Asthma   . CAD (coronary artery disease)    a. 09/2013 NSTEMI/PCI: LM nl, LAD 40-50%, D1 100 (2.25 x 28 Vision BMS), LCX min irregs, RI 60-70, 30, RCA 40-50/50-48ms/p, EF 45-50%.  b. cath 12/2014 -occulded BMS in diag, 50% pro to mid LAD, 60% ramus, 30% RCA   . Chest pain 08/2015  . Chronic diastolic CHF (congestive heart failure) ([place])    a. 09/2014 EF 45-50% by LV gram;  b. 01/2014 Echo: EF 55-60%, Gr 1 DD.  Marland Kitchen Cocaine abuse ([place])   . COPD (chronic obstructive pulmonary disease) ([place])   . DVT (deep venous thrombosis) ([place])    "I had ~ 10 in each leg" (12/20/2016)  . Essential  hypertension   . Hepatitis C    "clear free over a year now"  . High cholesterol   . Myocardial infarction ([place]) ~ 2014  . Osteoarthritis    a. hands and toes.  . Pneumonia    "several times" (12/20/2016)  . Pulmonary embolism ([place])    a. 2012 - s/p IVC filter;  b. prev on eliquis - noncompliant.  . Shortness of breath dyspnea   . Sinus headache   . Squamous cell skin cancer 07/13/2016   "foot"  . Tobacco abuse     Past Surgical History:  Procedure Laterality Date  . CARDIAC CATHETERIZATION N/A 01/04/2015   Procedure: Left Heart Cath and Coronary Angiography;  Surgeon: Burnell Blanks, MD;  Location: [facility];  Service: Cardiovascular;  Laterality: N/A;  . CORONARY ANGIOPLASTY WITH STENT PLACEMENT  10/21/13   BMS to D1  . CYST EXCISION N/A 05/10/2016   Procedure: EXCISION OF SEBACEOUS CYST UPPER BACK;  Surgeon: Donnie Mesa, MD;  Location: [place] [facility];  Service: General;  Laterality: N/A;  . FOOT SURGERY Right ~ 06/2016   "said there was skin cancer on it"  . LEFT HEART CATHETERIZATION WITH CORONARY ANGIOGRAM N/A 10/21/2013   Procedure: LEFT HEART CATHETERIZATION WITH CORONARY ANGIOGRAM;  Surgeon: Leonie Man, MD;  Location: [facility] C[facility];  Service: Cardiovascular;  Laterality: N/A;  . VENA CAVA FILTER PLACEMENT  ~  2007-~ 2017   "1 in my neck; 2 in my wrist"    Family History  Problem Relation Age of Onset  . Asthma Mother   . Allergies Mother   . Allergies Sister   . Allergies Brother   . Deep vein thrombosis Brother        two brothers with recurrent DVT  . Heart attack Father        a.60s b. deceased in his 80s  . Coronary artery disease Father   . Colon cancer Neg Hx   . Liver cancer Neg Hx   . Esophageal cancer Neg Hx   . Colon polyps Neg Hx   . Rectal cancer Neg Hx   . Pancreatic cancer Neg Hx   . Stomach cancer Neg Hx     Allergies  Allergen Reactions  . Atorvastatin     myalgia    Outpatient Medications Prior to Visit  Medication Sig  Dispense Refill  . albuterol (PROAIR HFA) 108 (90 Base) MCG/ACT inhaler Inhale 2 puffs into the lungs every 6 (six) hours as needed for wheezing or shortness of breath. 18 g 6  . albuterol (PROVENTIL) (2.5 MG/3ML) 0.083% nebulizer solution Take 3 mLs (2.5 mg total) by nebulization every 6 (six) hours as needed for wheezing or shortness of breath. 75 mL 3  . apixaban (ELIQUIS) 5 MG TABS tablet TAKE 1 TABLET (5 MG TOTAL) BY MOUTH 2 (TWO) TIMES DAILY. 60 tablet 6  . budesonide-formoterol (SYMBICORT) 160-4.5 MCG/ACT inhaler INHALE 2 PUFFS INTO THE LUNGS 2 (TWO) TIMES DAILY. 10.2 g 6  . cetirizine (ZYRTEC) 10 MG tablet Take 1 tablet (10 mg total) by mouth daily. 30 tablet 3  . Evolocumab 140 MG/ML SOAJ INJECT 140 MG INTO THE SKIN EVERY 14 (FOURTEEN) DAYS. 2 mL 11  . ezetimibe (ZETIA) 10 MG tablet TAKE 1 TABLET (10 MG TOTAL) BY MOUTH DAILY. 90 tablet 3  . fluticasone (FLONASE) 50 MCG/ACT nasal spray PLACE 2 SPRAYS INTO BOTH NOSTRILS 2 (TWO) TIMES DAILY. 16 g 2  . furosemide (LASIX) 40 MG tablet TAKE 1 TABLET (40 MG TOTAL) BY MOUTH DAILY. 30 tablet 6  . HYDROcodone-acetaminophen (NORCO/VICODIN) 5-325 MG tablet Take 1-2 tablets by mouth every 6 (six) hours as needed. 10 tablet 0  . isosorbide mononitrate (IMDUR) 30 MG 24 hr tablet Take 1 tablet (30 mg total) by mouth daily. 30 tablet 1  . isosorbide mononitrate (IMDUR) 30 MG 24 hr tablet TAKE 0.5 TABLETS (15 MG TOTAL) BY MOUTH DAILY. 15 tablet 6  . nitroGLYCERIN (NITROSTAT) 0.4 MG SL tablet Place 1 tablet (0.4 mg total) under the tongue every 5 (five) minutes as needed for chest pain. 25 tablet 3  . SYMBICORT 160-4.5 MCG/ACT inhaler Inhale 2 puffs into the lungs 2 (two) times daily.    Marland Kitchen tiZANidine (ZANAFLEX) 4 MG tablet TAKE 1 TABLET (4 MG TOTAL) BY MOUTH EVERY 8 (EIGHT) HOURS AS NEEDED FOR MUSCLE SPASMS. 60 tablet 2  . vitamin B-12 (CYANOCOBALAMIN) 1000 MCG tablet Take 1 tablet (1,000 mcg total) by mouth daily. 30 tablet 5  . Fluticasone-Umeclidin-Vilant  100-62.5-25 MCG/INH AEPB INHALE 1 INHALER INTO THE LUNGS DAILY. (Patient taking differently: Inhale 1 Inhaler into the lungs daily. Pt uses as needed.) 60 each 6  . gabapentin (NEURONTIN) 300 MG capsule TAKE 1 CAPSULE (300 MG TOTAL) BY MOUTH 2 (TWO) TIMES DAILY. 60 capsule 6  . pantoprazole (PROTONIX) 40 MG tablet TAKE 1 TABLET (40 MG TOTAL) BY MOUTH DAILY. 30 tablet 0  .  metoprolol succinate (TOPROL-XL) 25 MG 24 hr tablet TAKE 1 TABLET (25 MG TOTAL) BY MOUTH DAILY. 90 tablet 3  . predniSONE (DELTASONE) 10 MG tablet 6 TABS FOR 1 DAY, THEN 5 TABS FOR 1 DAY, THEN 4 TABS FOR 1 DAY, THEN 3 TABS FOR 1 DAY, 2 TABS FOR 1 DAY, THEN 1 TAB FOR 1 DAY (Patient not taking: Reported on 08/09/2020) 21 tablet 0  . predniSONE (DELTASONE) 10 MG tablet TAKE 6 TABS BY MOUTH DAILY FOR 2 DAYS, THEN 5 TABS FOR 2 DAYS, 4 TABS FOR 2 DAYS, 3 TABS FOR 2 DAYS, 2 TABS FOR 2 DAYS, 1 FOR 2 DAYS & STOP (Patient not taking: Reported on 08/09/2020) 42 tablet 0   No facility-administered medications prior to visit.     ROS Review of Systems  Constitutional: Negative for activity change and appetite change.  HENT: Negative for sinus pressure and sore throat.   Eyes: Negative for visual disturbance.  Respiratory: Negative for cough, chest tightness and shortness of breath.   Cardiovascular: Negative for chest pain and leg swelling.  Gastrointestinal: Negative for abdominal distention, abdominal pain, constipation and diarrhea.  Endocrine: Negative.   Genitourinary: Negative for dysuria.  Musculoskeletal:       See HPI  Skin: Negative for rash.  Allergic/Immunologic: Negative.   Neurological: Negative for weakness, light-headedness and numbness.  Psychiatric/Behavioral: Negative for dysphoric mood and suicidal ideas.    Objective:  BP 105/67   Pulse 89   Ht 6\' 2"  (1.88 m)   Wt 218 lb (98.9 kg)   SpO2 98%   BMI 27.99 kg/m   BP/Weight 08/09/2020 06/26/7060 05/31/6281  Systolic BP 151 761 607  Diastolic BP 67 86 90  Wt.  (Lbs) 218 217.2 219  BMI 27.99 27.89 28.12      Physical Exam Constitutional:      Appearance: He is well-developed.  Neck:     Vascular: No JVD.  Cardiovascular:     Rate and Rhythm: Normal rate.     Heart sounds: Normal heart sounds. No murmur heard.   Pulmonary:     Effort: Pulmonary effort is normal.     Breath sounds: Normal breath sounds. No wheezing or rales.  Chest:     Chest wall: No tenderness.  Abdominal:     General: Bowel sounds are normal. There is no distension.     Palpations: Abdomen is soft. There is no mass.     Tenderness: There is no abdominal tenderness.  Musculoskeletal:        General: Normal range of motion.     Right lower leg: No edema.     Left lower leg: No edema.     Comments: Positive straight leg raise on the RLE Strength RLE - 4/5; LLE 4+/5  Neurological:     Mental Status: He is alert and oriented to person, place, and time.  Psychiatric:        Mood and Affect: Mood normal.     CMP Latest Ref Rng & Units 05/04/2020 09/17/2019 09/16/2019  Glucose 70 - 99 mg/dL - 95 120(H)  BUN 6 - 20 mg/dL - 20 23(H)  Creatinine 0.61 - 1.24 mg/dL - 0.70 0.82  Sodium 135 - 145 mmol/L - 139 139  Potassium 3.5 - 5.1 mmol/L - 4.2 4.3  Chloride 98 - 111 mmol/L - 108 108  CO2 22 - 32 mmol/L - 24 23  Calcium 8.9 - 10.3 mg/dL - 8.6(L) 8.8(L)  Total Protein 6.0 - 8.5 g/dL 7.0 - -  Total Bilirubin 0.0 - 1.2 mg/dL 0.5 - -  Alkaline Phos 44 - 121 IU/L 122(H) - -  AST 0 - 40 IU/L 22 - -  ALT 0 - 44 IU/L 19 - -    Lipid Panel     Component Value Date/Time   CHOL 169 05/04/2020 1149   TRIG 102 05/04/2020 1149   HDL 60 05/04/2020 1149   CHOLHDL 2.8 05/04/2020 1149   CHOLHDL 3.1 01/01/2015 0208   VLDL 12 01/01/2015 0208   LDLCALC 91 05/04/2020 1149    CBC    Component Value Date/Time   WBC 7.6 09/17/2019 0646   RBC 3.43 (L) 09/17/2019 0646   HGB 10.3 (L) 09/17/2019 0646   HGB 13.0 06/09/2019 0926   HCT 32.6 (L) 09/17/2019 0646   HCT 38.4  06/09/2019 0926   PLT 165 09/17/2019 0646   PLT 212 06/09/2019 0926   MCV 95.0 09/17/2019 0646   MCV 93 06/09/2019 0926   MCH 30.0 09/17/2019 0646   MCHC 31.6 09/17/2019 0646   RDW 14.6 09/17/2019 0646   RDW 13.0 06/09/2019 0926   LYMPHSABS 1.1 09/14/2019 0707   LYMPHSABS 1.4 08/03/2016 1244   MONOABS 0.3 09/14/2019 0707   EOSABS 0.0 09/14/2019 0707   EOSABS 0.3 08/03/2016 1244   BASOSABS 0.0 09/14/2019 0707   BASOSABS 0.0 08/03/2016 1244    Lab Results  Component Value Date   HGBA1C 5.4 05/16/2016    Assessment & Plan:  1. Other emphysema ([place]) Stable with no recent exacerbations He continues to smoke and has been advised that cessation will be beneficial - Fluticasone-Umeclidin-Vilant 100-62.5-25 MCG/INH AEPB; INHALE 1 INHALER INTO THE LUNGS DAILY.  Dispense: 60 each; Refill: 6  2. Spinal stenosis of lumbar region with radiculopathy Uncontrolled Status post epidural spinal injections with no relief Placed on short course of prednisone Follow-up with neurology and neurosurgery - predniSONE (DELTASONE) 10 MG tablet; Take 1 tablet (10 mg total) by mouth 2 (two) times daily with a meal.  Dispense: 10 tablet; Refill: 0 - gabapentin (NEURONTIN) 300 MG capsule; Take 1 capsule (300 mg total) by mouth 2 (two) times daily.  Dispense: 60 capsule; Refill: 6  3. Screening for diabetes mellitus Will need to screen for diabetes mellitus given he has been on prednisone a couple of times for his lumbar radiculopathy -- Hemoglobin A1c  4. Chronic systolic heart failure (HCC) Stable with EF of 40 to 45% Euvolemic Advised to schedule appointment with his cardiologist.   Follow-up: Return in about 6 months (around 02/09/2021) for Chronic disease management.       Charlott Rakes, MD, FAAFP. [facility] [place], [place]   08/09/2020, 1:10 PM

## 2020-08-10 ENCOUNTER — Telehealth: Payer: Self-pay

## 2020-08-10 ENCOUNTER — Other Ambulatory Visit: Payer: Self-pay

## 2020-08-10 LAB — HEMOGLOBIN A1C
Est. average glucose Bld gHb Est-mCnc: 123 mg/dL
Hgb A1c MFr Bld: 5.9 % — ABNORMAL HIGH (ref 4.8–5.6)

## 2020-08-10 NOTE — Telephone Encounter (Signed)
-----   Message from Charlott Rakes, MD sent at 08/10/2020  9:00 AM EDT ----- Please inform him that his A1C is 5.9 in the Prediabetic range and he will need to work on low carb diet, reduce sweets, exercise to prevent progression to Diabetes. No medication needs to be initiated at this time. Thanks

## 2020-08-10 NOTE — Telephone Encounter (Signed)
Patient name and DOB has been verified Patient was informed of lab results. Patient had no questions.  

## 2020-08-11 ENCOUNTER — Encounter: Payer: Self-pay | Admitting: Podiatry

## 2020-08-11 ENCOUNTER — Ambulatory Visit (INDEPENDENT_AMBULATORY_CARE_PROVIDER_SITE_OTHER): Payer: Medicare Other | Admitting: Podiatry

## 2020-08-11 ENCOUNTER — Other Ambulatory Visit: Payer: Self-pay

## 2020-08-11 DIAGNOSIS — M79675 Pain in left toe(s): Secondary | ICD-10-CM | POA: Diagnosis not present

## 2020-08-11 DIAGNOSIS — M79674 Pain in right toe(s): Secondary | ICD-10-CM | POA: Diagnosis not present

## 2020-08-11 DIAGNOSIS — Z7901 Long term (current) use of anticoagulants: Secondary | ICD-10-CM

## 2020-08-11 DIAGNOSIS — B351 Tinea unguium: Secondary | ICD-10-CM

## 2020-08-11 NOTE — Progress Notes (Signed)
This patient returns to my office for at risk foot care.  This patient requires this care by a professional since this patient will be at risk due to having coagulation defect. Patient is taking eliquis.  This patient is unable to cut nails himself since the patient cannot reach his nails.These nails are painful walking and wearing shoes.  This patient presents for at risk foot care today.  General Appearance  Alert, conversant and in no acute stress.  Vascular  Dorsalis pedis and posterior tibial  pulses are palpable  bilaterally.  Capillary return is within normal limits  bilaterally. Temperature is within normal limits  bilaterally.  Neurologic  Senn-Weinstein monofilament wire test within normal limits  bilaterally. Muscle power within normal limits bilaterally.  Nails Thick disfigured discolored nails with subungual debris  from hallux to fifth toes bilaterally. No evidence of bacterial infection or drainage bilaterally.  Orthopedic  No limitations of motion  feet .  No crepitus or effusions noted.  No bony pathology or digital deformities noted.  Skin  normotropic skin with no porokeratosis noted bilaterally.  No signs of infections or ulcers noted.     Onychomycosis  Pain in right toes  Pain in left toes  Consent was obtained for treatment procedures.   Mechanical debridement of nails 1-5  bilaterally performed with a nail nipper.  Filed with dremel without incident.    Return office visit    4   months                  Told patient to return for periodic foot care and evaluation due to potential at risk complications.   Thadd Apuzzo DPM  

## 2020-08-12 ENCOUNTER — Telehealth: Payer: Self-pay

## 2020-08-12 DIAGNOSIS — E785 Hyperlipidemia, unspecified: Secondary | ICD-10-CM

## 2020-08-12 NOTE — Telephone Encounter (Signed)
Called and spoke w/him he stated that he takes zetia and coming for lipid labs tomorrow

## 2020-08-12 NOTE — Telephone Encounter (Signed)
-----   Message from Leeroy Bock, [place] sent at 08/12/2020 10:20 AM EDT ----- Regarding: FW: SPC Exclusion Code Forwarding message below - looks like pt was started on ezetimibe in Feb when LDL was still above goal on Repatha. Per fill records doesn't look like pt has picked up ezetimibe - Shi Grose can you check with pt to make sure he started ezetimibe? And Erasmo Downer can you add below statin exclusion code to your note from earlier this year?  Thanks, Jinny Blossom ----- Message ----- From: Elayne Guerin, [facility] Sent: 08/12/2020   9:00 AM EDT To: Leeroy Bock, RPH-CPP Subject: SPC Exclusion Code                             Hey there!  [name] has an appointment 08/13/20 can you help ensure a statin exclusion code gets added to his visit?  Also is he on Repatha or just Ezetimibe.?  Per review of chart and payor information, patient has a diagnosis of clinical ASCVD but is not currently prescribed a statin.  Noted patient has history of statin intolerances due to ##     Exclusion Code Requirements:    1) Provider must add exclusion code to problem list during current calendar year    2) Provider must associate code during an encounter (in person or virtual) during current calendar year       Drug Induced Myopathy: G72.0 Myalgia: M79.1 Myositis, unspecified M60.9 Myopathy, unspecified G72.9 Rhabdomyolysis M62.82  Thank you!   Elayne Guerin, PharmD, [place] Clinical Pharmacist [phone]

## 2020-08-13 ENCOUNTER — Ambulatory Visit (INDEPENDENT_AMBULATORY_CARE_PROVIDER_SITE_OTHER): Payer: Medicare Other | Admitting: Internal Medicine

## 2020-08-13 ENCOUNTER — Other Ambulatory Visit: Payer: Self-pay

## 2020-08-13 ENCOUNTER — Encounter: Payer: Self-pay | Admitting: Internal Medicine

## 2020-08-13 VITALS — BP 140/76 | HR 66 | Wt 221.0 lb

## 2020-08-13 DIAGNOSIS — I5042 Chronic combined systolic (congestive) and diastolic (congestive) heart failure: Secondary | ICD-10-CM | POA: Diagnosis not present

## 2020-08-13 DIAGNOSIS — I2089 Other forms of angina pectoris: Secondary | ICD-10-CM

## 2020-08-13 DIAGNOSIS — G720 Drug-induced myopathy: Secondary | ICD-10-CM | POA: Diagnosis not present

## 2020-08-13 DIAGNOSIS — I10 Essential (primary) hypertension: Secondary | ICD-10-CM

## 2020-08-13 DIAGNOSIS — I208 Other forms of angina pectoris: Secondary | ICD-10-CM | POA: Diagnosis not present

## 2020-08-13 DIAGNOSIS — I5022 Chronic systolic (congestive) heart failure: Secondary | ICD-10-CM

## 2020-08-13 DIAGNOSIS — I25119 Atherosclerotic heart disease of native coronary artery with unspecified angina pectoris: Secondary | ICD-10-CM

## 2020-08-13 DIAGNOSIS — T466X5D Adverse effect of antihyperlipidemic and antiarteriosclerotic drugs, subsequent encounter: Secondary | ICD-10-CM | POA: Diagnosis not present

## 2020-08-13 DIAGNOSIS — Z79899 Other long term (current) drug therapy: Secondary | ICD-10-CM | POA: Diagnosis not present

## 2020-08-13 DIAGNOSIS — T466X5A Adverse effect of antihyperlipidemic and antiarteriosclerotic drugs, initial encounter: Secondary | ICD-10-CM

## 2020-08-13 DIAGNOSIS — E785 Hyperlipidemia, unspecified: Secondary | ICD-10-CM

## 2020-08-13 LAB — HEPATIC FUNCTION PANEL
ALT: 25 IU/L (ref 0–44)
AST: 18 IU/L (ref 0–40)
Albumin: 4.4 g/dL (ref 3.8–4.9)
Alkaline Phosphatase: 122 IU/L — ABNORMAL HIGH (ref 44–121)
Bilirubin Total: <0.2 mg/dL (ref 0.0–1.2)
Bilirubin, Direct: <0.1 mg/dL (ref 0.00–0.40)
Total Protein: 7.8 g/dL (ref 6.0–8.5)

## 2020-08-13 LAB — LIPID PANEL
Chol/HDL Ratio: 2.4 ratio (ref 0.0–5.0)
Cholesterol, Total: 168 mg/dL (ref 100–199)
HDL: 69 mg/dL (ref >39)
LDL Chol Calc (NIH): 80 mg/dL (ref 0–99)
Triglycerides: 104 mg/dL (ref 0–149)
VLDL Cholesterol Cal: 19 mg/dL (ref 5–40)

## 2020-08-13 MED ORDER — ISOSORBIDE MONONITRATE ER 30 MG PO TB24
30.0000 mg | ORAL_TABLET | Freq: Every day | ORAL | 3 refills | Status: DC
  Filled 2020-08-13: qty 30, 30d supply, fill #0
  Filled 2020-10-05: qty 90, 90d supply, fill #0

## 2020-08-13 MED ORDER — FUROSEMIDE 40 MG PO TABS
40.0000 mg | ORAL_TABLET | Freq: Every day | ORAL | 6 refills | Status: DC | PRN
  Filled 2020-08-13 – 2020-08-25 (×2): qty 30, 30d supply, fill #0

## 2020-08-13 NOTE — Progress Notes (Signed)
Cardiology Office Note:    Date:  08/13/2020   ID:  [name], DOB 06/05/1960, MRN [id number]  PCP:  Charlott Rakes, MD  Cardiologist:  Elouise Munroe, MD  Electrophysiologist:  None   Referring MD: Charlott Rakes, MD   Chief Complaint/Reason for Referral: Follow up CAD and HLD  History of Present Illness:    [name] is a 60 y.o. male with a history of CAD (s/p BMS to D1 in 2015, noted to be chronically occluded by cath in 12/2014 with scattered 30% RCA stenosis, 20% LCx, and 50% Prox-Mid LAD stenosis at that time), PE/DVT (s/p IVC filter placement in 2012), COPD, chronic diastolic CHF (EF 59-56% by echo in 12/2014), HTN, HLD, chronic Hepatitis C, substance use (Cocaine - UDS from 01/2016 positive), and tobacco use.    08/13/2020: Since his prior visit he has visited a neurologist and received two injections in his back. However, he is still experiencing right LE pain and bilateral LE weakness. He is also having significant difficulty with being able to stand, and is sometimes unable to stand. He will return to the neurologist on 08/31/2020.  I strongly suspect that his lower extremity symptoms are non-vascular, and agree with continued management by neurology.  After about 50-75 yards of walking (such as lifting groceries) he develops mild chest pain/heaviness, shortness of breath, and has pre-syncopal feelings. When walking without carrying heavy objects he feels better aside from his LE weakness.  He has not tried sublingual nitroglycerin during these episodes.  We had originally intended for him to be on 30 mg of Imdur but he is only taking half a tab 15 mg daily.  Unclear reason why, and we discussed increasing the dose back to 30 mg daily for continued management.  He is also not taking Lasix, and I suspect some of his symptoms may be related to elevated LV pressure with reduced EF.  I encouraged him to take Lasix particularly on days when he notices the increased chest  pressure.  He had a nonischemic nuclear study last year, I am less concerned that this is related to high risk ischemia.  Symptoms may be due to small vessel disease and residual CAD, medical management is indicated and we discussed increasing Imdur.  He denies any palpitations, headaches, lower extremity edema, orthopnea or PND.  He remains compliant with his Repatha injections once every 2 weeks. At this time he is not taking Lasix at all due to polyuria.  For support he stays connected with his 3 brothers and 1 sister.  Past Medical History:  Diagnosis Date  . Asthma   . CAD (coronary artery disease)    a. 09/2013 NSTEMI/PCI: LM nl, LAD 40-50%, D1 100 (2.25 x 28 Vision BMS), LCX min irregs, RI 60-70, 30, RCA 40-50/50-15ms/p, EF 45-50%.  b. cath 12/2014 -occulded BMS in diag, 50% pro to mid LAD, 60% ramus, 30% RCA   . Chest pain 08/2015  . Chronic diastolic CHF (congestive heart failure) ([place])    a. 09/2014 EF 45-50% by LV gram;  b. 01/2014 Echo: EF 55-60%, Gr 1 DD.  Marland Kitchen Cocaine abuse ([place])   . COPD (chronic obstructive pulmonary disease) ([place])   . DVT (deep venous thrombosis) ([place])    "I had ~ 10 in each leg" (12/20/2016)  . Essential hypertension   . Hepatitis C    "clear free over a year now"  . High cholesterol   . Myocardial infarction ([place]) ~ 2014  . Osteoarthritis  a. hands and toes.  . Pneumonia    "several times" (12/20/2016)  . Pulmonary embolism ([place])    a. 2012 - s/p IVC filter;  b. prev on eliquis - noncompliant.  . Shortness of breath dyspnea   . Sinus headache   . Squamous cell skin cancer 07/13/2016   "foot"  . Tobacco abuse     Past Surgical History:  Procedure Laterality Date  . CARDIAC CATHETERIZATION N/A 01/04/2015   Procedure: Left Heart Cath and Coronary Angiography;  Surgeon: Burnell Blanks, MD;  Location: [facility];  Service: Cardiovascular;  Laterality: N/A;  . CORONARY ANGIOPLASTY WITH STENT PLACEMENT  10/21/13   BMS to D1  . CYST  EXCISION N/A 05/10/2016   Procedure: EXCISION OF SEBACEOUS CYST UPPER BACK;  Surgeon: Donnie Mesa, MD;  Location: [facility];  Service: General;  Laterality: N/A;  . FOOT SURGERY Right ~ 06/2016   "said there was skin cancer on it"  . LEFT HEART CATHETERIZATION WITH CORONARY ANGIOGRAM N/A 10/21/2013   Procedure: LEFT HEART CATHETERIZATION WITH CORONARY ANGIOGRAM;  Surgeon: Leonie Man, MD;  Location: [facility];  Service: Cardiovascular;  Laterality: N/A;  . VENA CAVA FILTER PLACEMENT  ~ 2007-~ 2017   "1 in my neck; 2 in my wrist"    Current Medications: Current Meds  Medication Sig  . albuterol (PROAIR HFA) 108 (90 Base) MCG/ACT inhaler Inhale 2 puffs into the lungs every 6 (six) hours as needed for wheezing or shortness of breath.  Marland Kitchen albuterol (PROVENTIL) (2.5 MG/3ML) 0.083% nebulizer solution Take 3 mLs (2.5 mg total) by nebulization every 6 (six) hours as needed for wheezing or shortness of breath.  Marland Kitchen apixaban (ELIQUIS) 5 MG TABS tablet TAKE 1 TABLET (5 MG TOTAL) BY MOUTH 2 (TWO) TIMES DAILY.  . budesonide-formoterol (SYMBICORT) 160-4.5 MCG/ACT inhaler INHALE 2 PUFFS INTO THE LUNGS 2 (TWO) TIMES DAILY.  . cetirizine (ZYRTEC) 10 MG tablet Take 1 tablet (10 mg total) by mouth daily.  . Evolocumab 140 MG/ML SOAJ INJECT 140 MG INTO THE SKIN EVERY 14 (FOURTEEN) DAYS.  Marland Kitchen ezetimibe (ZETIA) 10 MG tablet TAKE 1 TABLET (10 MG TOTAL) BY MOUTH DAILY.  . fluticasone (FLONASE) 50 MCG/ACT nasal spray PLACE 2 SPRAYS INTO BOTH NOSTRILS 2 (TWO) TIMES DAILY.  Marland Kitchen Fluticasone-Umeclidin-Vilant 100-62.5-25 MCG/INH AEPB INHALE 1 INHALER INTO THE LUNGS DAILY.  Marland Kitchen gabapentin (NEURONTIN) 300 MG capsule Take 1 capsule (300 mg total) by mouth 2 (two) times daily.  Marland Kitchen HYDROcodone-acetaminophen (NORCO/VICODIN) 5-325 MG tablet Take 1-2 tablets by mouth every 6 (six) hours as needed.  . nitroGLYCERIN (NITROSTAT) 0.4 MG SL tablet Place 1 tablet (0.4 mg total) under the tongue every 5 (five) minutes as needed for chest pain.   . pantoprazole (PROTONIX) 40 MG tablet TAKE 1 TABLET (40 MG TOTAL) BY MOUTH DAILY.  Marland Kitchen predniSONE (DELTASONE) 10 MG tablet 6 TABS FOR 1 DAY, THEN 5 TABS FOR 1 DAY, THEN 4 TABS FOR 1 DAY, THEN 3 TABS FOR 1 DAY, 2 TABS FOR 1 DAY, THEN 1 TAB FOR 1 DAY  . SYMBICORT 160-4.5 MCG/ACT inhaler Inhale 2 puffs into the lungs 2 (two) times daily.  Marland Kitchen tiZANidine (ZANAFLEX) 4 MG tablet TAKE 1 TABLET (4 MG TOTAL) BY MOUTH EVERY 8 (EIGHT) HOURS AS NEEDED FOR MUSCLE SPASMS.  . vitamin B-12 (CYANOCOBALAMIN) 1000 MCG tablet Take 1 tablet (1,000 mcg total) by mouth daily.  . [DISCONTINUED] furosemide (LASIX) 40 MG tablet TAKE 1 TABLET (40 MG TOTAL) BY MOUTH DAILY.  . [  DISCONTINUED] isosorbide mononitrate (IMDUR) 30 MG 24 hr tablet Take 1 tablet (30 mg total) by mouth daily.  . [DISCONTINUED] isosorbide mononitrate (IMDUR) 30 MG 24 hr tablet TAKE 0.5 TABLETS (15 MG TOTAL) BY MOUTH DAILY.  . [DISCONTINUED] isosorbide mononitrate (IMDUR) 30 MG 24 hr tablet Take 15 mg by mouth daily.  . [DISCONTINUED] predniSONE (DELTASONE) 10 MG tablet Take 1 tablet (10 mg total) by mouth 2 (two) times daily with a meal.  . [DISCONTINUED] rosuvastatin (CRESTOR) 5 MG tablet Take 1 tablet (5 mg total) by mouth daily.    Allergies:   Atorvastatin   Social History   Tobacco Use  . Smoking status: Current Every Day Smoker    Packs/day: 0.25    Years: 39.00    Pack years: 9.75    Types: Cigarettes, Cigars  . Smokeless tobacco: Never Used  Vaping Use  . Vaping Use: Never used  Substance Use Topics  . Alcohol use: Yes    Alcohol/week: 6.0 standard drinks    Types: 2 Cans of beer, 4 Shots of liquor per week  . Drug use: Not Currently    Types: Cocaine    Comment: 12/20/2016 "might have used some the other day; I'm not sure"     Family History: The patient's family history includes Allergies in his brother, mother, and sister; Asthma in his mother; Coronary artery disease in his father; Deep vein thrombosis in his brother; Heart  attack in his father. There is no history of Colon cancer, Liver cancer, Esophageal cancer, Colon polyps, Rectal cancer, Pancreatic cancer, or Stomach cancer.  ROS:   Please see the history of present illness.    (+) Right LE pain (+) Bilateral LE weakness (+) Chest pain/heaviness (+) Shortness of breath (+) Pre-syncope All other systems reviewed and are negative.  EKGs/Labs/Other Studies Reviewed:    The following studies were reviewed today:  Echo 07/07/2020: 1. Left ventricular ejection fraction, by estimation, is 40 to 45%. The  left ventricle has mild to moderately decreased function. The left  ventricle demonstrates global hypokinesis. There is mild left ventricular  hypertrophy. Left ventricular diastolic  parameters are consistent with Grade I diastolic dysfunction (impaired  relaxation). There is abnormal septal motion.  2. Right ventricular systolic function is normal. The right ventricular  size is normal.  3. The mitral valve is normal in structure. Trivial mitral valve  regurgitation. No evidence of mitral stenosis.  4. The aortic valve is tricuspid. Aortic valve regurgitation is not  visualized. No aortic stenosis is present.  5. Aortic dilatation noted. There is mild dilatation of the aortic root  measuring 40 mm.   Comparison(s): A prior study was performed on 11/29/2017. No significant  change from prior study. Prior images reviewed side by side.   EKG: 08/13/2020: NSR, rate 66 bpm  Recent Labs: 09/14/2019: Magnesium 2.3 09/17/2019: BUN 20; Creatinine, Ser 0.70; Hemoglobin 10.3; Platelets 165; Potassium 4.2; Sodium 139 03/12/2020: TSH 1.810 05/04/2020: ALT 19  Recent Lipid Panel    Component Value Date/Time   CHOL 169 05/04/2020 1149   TRIG 102 05/04/2020 1149   HDL 60 05/04/2020 1149   CHOLHDL 2.8 05/04/2020 1149   CHOLHDL 3.1 01/01/2015 0208   VLDL 12 01/01/2015 0208   LDLCALC 91 05/04/2020 1149    Physical Exam:    VS:  BP 140/76   Pulse 66    Wt 221 lb (100.2 kg)   SpO2 99%   BMI 28.37 kg/m     Wt Readings from Last  5 Encounters:  08/13/20 221 lb (100.2 kg)  08/09/20 218 lb (98.9 kg)  05/04/20 217 lb 3.2 oz (98.5 kg)  04/28/20 219 lb (99.3 kg)  03/12/20 216 lb 6.4 oz (98.2 kg)    Constitutional: No acute distress Eyes: sclera non-icteric, normal conjunctiva and lids ENMT: normal dentition, moist mucous membranes Cardiovascular: regular rhythm, normal rate, no murmurs. S1 and S2 normal. Radial pulses normal bilaterally. No jugular venous distention.  Respiratory: clear to auscultation bilaterally GI : normal bowel sounds, soft and nontender. No distention.   MSK: extremities warm, well perfused. No edema.  Venous varicosities in the lower extremities. NEURO: grossly nonfocal exam, moves all extremities. PSYCH: alert and oriented x 3, normal mood and affect.   ASSESSMENT:    1. Chronic systolic heart failure ([place])   2. Chronic combined systolic and diastolic congestive heart failure ([place])   3. Medication management   4. Hyperlipidemia, unspecified hyperlipidemia type   5. Statin myopathy   6. Coronary artery disease involving native coronary artery of native heart with angina pectoris ([place])   7. Stable angina pectoris ([place])   8. Essential hypertension    PLAN:    Coronary artery disease involving native coronary artery of native heart with angina pectoris ([place]) Stable angina pectoris ([place]) - currently doing well on Eliquis (history of DVT/PE and IVC filter), metoprolol succinate 25 mg daily.  BP is mildly elevated today, will need to consider adding ARB in near future.  We will increase the dose of Imdur today.  He has previously had low blood pressures so we will see how he tolerates this first.  Repeat echo in 6 months.  Hyperlipidemia, unspecified hyperlipidemia type Statin intolerance - repatha per CVRR, tolerating well.  Essential hypertension - bp  mildly elevated today but has previously been somewhat low,  increased dose of Imdur for medical management of CAD and angina.  Chronic systolic heart failure (HCC) Heart Failure Therapy ACE-I/ARB/ARNI: consider adding low dose losartan or entresto if BP tolerates BB: metoprolol MRA: none currently, could add spironolactone 25 mg daily SGLT2I: none currently, will add at next follow up Diuretic plan: furosemide 40 mg as needed.   Total time of encounter: 30 minutes total time of encounter, including 20 minutes spent in face-to-face patient care on the date of this encounter. This time includes coordination of care and counseling regarding above mentioned problem list. Remainder of non-face-to-face time involved reviewing chart documents/testing relevant to the patient encounter and documentation in the medical record. I have independently reviewed documentation from referring provider.   Cherlynn Kaiser, MD [place]  [facility]    Medication Adjustments/Labs and Tests Ordered: Current medicines are reviewed at length with the patient today.  Concerns regarding medicines are outlined above.   Orders Placed This Encounter  Procedures  . EKG 12-Lead  . ECHOCARDIOGRAM COMPLETE     Meds ordered this encounter  Medications  . isosorbide mononitrate (IMDUR) 30 MG 24 hr tablet    Sig: Take 1 tablet (30 mg total) by mouth daily.    Dispense:  30 tablet    Refill:  3  . furosemide (LASIX) 40 MG tablet    Sig: Take 1 tablet (40 mg total) by mouth daily as needed (May take 40mg  Daily for Swelling and Shortness of breath).    Dispense:  30 tablet    Refill:  6    Patient Instructions  Medication Instructions:  INCREASE: Imdur (ISOSORBIDE) to 30mg  (1) TABLET DAILY MAY TAKE LASIX (FUROSEMIDE) 40mg   DAILY AS NEEDED FOR SWELLING OR SHORTNESS OF BREATH  *If you need a refill on your cardiac medications before your next appointment, please call your pharmacy*  Your physician has requested that you have an echocardiogram IN 6 MONTHS.  Echocardiography is a painless test that uses sound waves to create images of your heart. It provides your doctor with information about the size and shape of your heart and how well your heart's chambers and valves are working. You may receive an ultrasound enhancing agent through an IV if needed to better visualize your heart during the echo.This procedure takes approximately one hour. There are no restrictions for this procedure. This will take place at the [address].   Follow-Up: At [facility], you and your health needs are our priority.  As part of our continuing mission to provide you with exceptional heart care, we have created designated Provider Care Teams.  These Care Teams include your primary Cardiologist (physician) and Advanced Practice Providers (APPs -  Physician Assistants and Nurse Practitioners) who all work together to provide you with the care you need, when you need it.  We recommend signing up for the patient portal called "MyChart".  Sign up information is provided on this After Visit Summary.  MyChart is used to connect with patients for Virtual Visits (Telemedicine).  Patients are able to view lab/test results, encounter notes, upcoming appointments, etc.  Non-urgent messages can be sent to your provider as well.   To learn more about what you can do with MyChart, go to [email or web address].    Your next appointment:   6 month(s)  The format for your next appointment:   In Person  Provider:   Cherlynn Kaiser, MD     [facility],acting as a scribe for Elouise Munroe, MD.,have documented all relevant documentation on the behalf of Elouise Munroe, MD,as directed by  Elouise Munroe, MD while in the presence of Elouise Munroe, MD.  I, Elouise Munroe, MD, have reviewed all documentation for this visit. The documentation on 08/13/20 for the exam, diagnosis, procedures, and orders are all accurate and complete.

## 2020-08-13 NOTE — Patient Instructions (Addendum)
Medication Instructions:  INCREASE: Imdur (ISOSORBIDE) to 30mg  (1) TABLET DAILY MAY TAKE LASIX (FUROSEMIDE) 40mg  DAILY AS NEEDED FOR SWELLING OR SHORTNESS OF BREATH  *If you need a refill on your cardiac medications before your next appointment, please call your pharmacy*  Your physician has requested that you have an echocardiogram IN 6 MONTHS. Echocardiography is a painless test that uses sound waves to create images of your heart. It provides your doctor with information about the size and shape of your heart and how well your heart's chambers and valves are working. You may receive an ultrasound enhancing agent through an IV if needed to better visualize your heart during the echo.This procedure takes approximately one hour. There are no restrictions for this procedure. This will take place at the [address].   Follow-Up: At [facility], you and your health needs are our priority.  As part of our continuing mission to provide you with exceptional heart care, we have created designated Provider Care Teams.  These Care Teams include your primary Cardiologist (physician) and Advanced Practice Providers (APPs -  Physician Assistants and Nurse Practitioners) who all work together to provide you with the care you need, when you need it.  We recommend signing up for the patient portal called "MyChart".  Sign up information is provided on this After Visit Summary.  MyChart is used to connect with patients for Virtual Visits (Telemedicine).  Patients are able to view lab/test results, encounter notes, upcoming appointments, etc.  Non-urgent messages can be sent to your provider as well.   To learn more about what you can do with MyChart, go to [email or web address].    Your next appointment:   6 month(s)  The format for your next appointment:   In Person  Provider:   Cherlynn Kaiser, MD

## 2020-08-17 DIAGNOSIS — T466X5A Adverse effect of antihyperlipidemic and antiarteriosclerotic drugs, initial encounter: Secondary | ICD-10-CM | POA: Insufficient documentation

## 2020-08-17 DIAGNOSIS — Z20822 Contact with and (suspected) exposure to covid-19: Secondary | ICD-10-CM | POA: Diagnosis not present

## 2020-08-17 DIAGNOSIS — G720 Drug-induced myopathy: Secondary | ICD-10-CM | POA: Insufficient documentation

## 2020-08-18 ENCOUNTER — Other Ambulatory Visit: Payer: Self-pay

## 2020-08-18 ENCOUNTER — Other Ambulatory Visit: Payer: Self-pay | Admitting: Family Medicine

## 2020-08-18 NOTE — Telephone Encounter (Signed)
Requested medication (s) are due for refill today - yes- expired rx  Requested medication (s) are on the active medication list -yes  Future visit scheduled -no  Last refill: 06/11/20  Notes to clinic: Request RF- outside provider  Requested Prescriptions  Pending Prescriptions Disp Refills   metoprolol succinate (TOPROL-XL) 25 MG 24 hr tablet 90 tablet 3    Sig: TAKE 1 TABLET (25 MG TOTAL) BY MOUTH DAILY.      There is no refill protocol information for this order        Requested Prescriptions  Pending Prescriptions Disp Refills   metoprolol succinate (TOPROL-XL) 25 MG 24 hr tablet 90 tablet 3    Sig: TAKE 1 TABLET (25 MG TOTAL) BY MOUTH DAILY.      There is no refill protocol information for this order

## 2020-08-20 ENCOUNTER — Other Ambulatory Visit: Payer: Self-pay

## 2020-08-21 DIAGNOSIS — Z20822 Contact with and (suspected) exposure to covid-19: Secondary | ICD-10-CM | POA: Diagnosis not present

## 2020-08-24 ENCOUNTER — Other Ambulatory Visit: Payer: Self-pay

## 2020-08-24 DIAGNOSIS — Z20822 Contact with and (suspected) exposure to covid-19: Secondary | ICD-10-CM | POA: Diagnosis not present

## 2020-08-25 ENCOUNTER — Other Ambulatory Visit: Payer: Self-pay

## 2020-08-30 ENCOUNTER — Other Ambulatory Visit: Payer: Self-pay

## 2020-08-31 ENCOUNTER — Other Ambulatory Visit: Payer: Self-pay

## 2020-08-31 ENCOUNTER — Ambulatory Visit (INDEPENDENT_AMBULATORY_CARE_PROVIDER_SITE_OTHER): Payer: Medicare Other | Admitting: Neurology

## 2020-08-31 ENCOUNTER — Encounter: Payer: Self-pay | Admitting: Neurology

## 2020-08-31 VITALS — BP 155/102 | HR 111 | Ht 74.0 in | Wt 221.0 lb

## 2020-08-31 DIAGNOSIS — I5042 Chronic combined systolic (congestive) and diastolic (congestive) heart failure: Secondary | ICD-10-CM

## 2020-08-31 DIAGNOSIS — G8929 Other chronic pain: Secondary | ICD-10-CM

## 2020-08-31 DIAGNOSIS — R269 Unspecified abnormalities of gait and mobility: Secondary | ICD-10-CM | POA: Diagnosis not present

## 2020-08-31 DIAGNOSIS — M5441 Lumbago with sciatica, right side: Secondary | ICD-10-CM | POA: Diagnosis not present

## 2020-08-31 MED ORDER — EZETIMIBE 10 MG PO TABS
ORAL_TABLET | Freq: Every day | ORAL | 3 refills | Status: DC
  Filled 2020-08-31: qty 90, 90d supply, fill #0

## 2020-08-31 MED ORDER — FUROSEMIDE 40 MG PO TABS
40.0000 mg | ORAL_TABLET | Freq: Every day | ORAL | 6 refills | Status: DC | PRN
  Filled 2020-10-08: qty 90, 90d supply, fill #0

## 2020-08-31 NOTE — Patient Instructions (Addendum)
Please follow-up with Dr. Trenton Gammon if injections aren't helpful, [facility] See you back in 6-8 months

## 2020-08-31 NOTE — Progress Notes (Signed)
Chief Complaint  Patient presents with  . Follow-up    New rm, states leg pain is worse, c/o numbness    HISTORICAL  [name] is a 60 year old male, seen in request by his primary care physician Dr. Margarita Rana, [name] for evaluation of lower extremity weakness, gait abnormality, initial evaluation was on March 12, 2020.  I reviewed and summarized the referring note. PMHX. HTN Asthma CAD, DVT since 2014, and PE. Smoker,  2 cigarette this week  He reported more than 10 years history of bilateral lower extremity difficulty, he is a poor historian, reported without clear triggers, he began to notice difficulty getting up from seated position, he has to push on chair arms to get up from seated position, slight worsening of the past 10 years, he gave me an example, he was working the yard, fell to the ground, even with his friend's help, he could not get up from low seated position, he has to climb to the front staff to pull himself up, he denies significant upper extremity weakness  He does complains of slow worsening low back pain, especially on the right side, radiating pain to right lower extremity, also complains of bilateral feet and lower extremity paresthesia, numbness, also involving left calf,  He complains some neck pain, denies radiating pain, denies numbness tingling at his feet, denies bowel bladder incontinence, he denies double vision, no chewing or swallowing difficulty.  He reported a long history of recurrent bilateral lower extremity DVT, PE, went on disability more than 12 years ago.  Lab in Nov 2021, Lipid Panel, LDL 162, cholesterol 231, Methylmalonic acid level 763, BMP, Cal 8.6, Hg 10.3, ferritin 42, B12 259,  HIV -  UPDATE Apr 28 2020: Patient insists that he had difficulty getting up from seated position for about 10 years, remember he has to push on chair arm to get up from seated position, gradually getting worse, if he fell to the ground, it is very  difficult for him to get up, even with the help,  Personally reviewed MRI of cervical and lumbar spine January 2022: Cervical spine showed multilevel degenerative changes, most obvious at C3-4, central disc bulging, posterior ligament foramen hypertrophy, with moderate canal bilateral foraminal narrowing, MRI of lumbar spine prominent spondylitic and facet degenerative changes from L3-S1, most prominent at L5-S1, broad based disc osteophyte protrusion, and ligament flavum and hypertrophy, with mild canal, severe right side moderate left-sided foraminal narrowing,  Update August 31, 2020 SS: Here today for follow-up, reports had 2 ESI injections with Dr. Trenton Gammon, last was 1 month ago, no help. Hasn't been back. When standing, has leg pain bilaterally, feels weak, but right leg is worse. Prednisone helps his symptoms, given 5 day taper last week for right sided leg pain. Right foot/leg is gets numb even with sitting. No B/B incontinence.   Dr. Rhea Belton note 04/28/2020 :Addendum: Evaluation by neurosurgeon Dr. Deri Fuelling on April 30, 2020, cervical spine demonstrate significant multilevel cervical degeneration with moderate spinal stenosis, early cord compression at C3-4, no cord signal abnormality, MRI of the lumbar demonstrate progressive disc degeneration with central disc bulging at L5-S1, he seems to be primarily symptomatic secondary to his low back pain with intermittent radicular symptoms, refer to epidural steroid injection, monitor his symptoms for worsening cervical myelopathy  REVIEW OF SYSTEMS: Full 14 system review of systems performed and notable only for as above  See HPI  ALLERGIES: Allergies  Allergen Reactions  . Atorvastatin     myalgia  HOME MEDICATIONS: Current Outpatient Medications  Medication Sig Dispense Refill  . albuterol (PROAIR HFA) 108 (90 Base) MCG/ACT inhaler Inhale 2 puffs into the lungs every 6 (six) hours as needed for wheezing or shortness of breath. 18 g 6  .  albuterol (PROVENTIL) (2.5 MG/3ML) 0.083% nebulizer solution Take 3 mLs (2.5 mg total) by nebulization every 6 (six) hours as needed for wheezing or shortness of breath. 75 mL 3  . apixaban (ELIQUIS) 5 MG TABS tablet TAKE 1 TABLET (5 MG TOTAL) BY MOUTH 2 (TWO) TIMES DAILY. 60 tablet 6  . budesonide-formoterol (SYMBICORT) 160-4.5 MCG/ACT inhaler INHALE 2 PUFFS INTO THE LUNGS 2 (TWO) TIMES DAILY. 10.2 g 6  . cetirizine (ZYRTEC) 10 MG tablet Take 1 tablet (10 mg total) by mouth daily. 30 tablet 3  . Evolocumab 140 MG/ML SOAJ INJECT 140 MG INTO THE SKIN EVERY 14 (FOURTEEN) DAYS. 2 mL 11  . ezetimibe (ZETIA) 10 MG tablet TAKE 1 TABLET (10 MG TOTAL) BY MOUTH DAILY. 90 tablet 3  . fluticasone (FLONASE) 50 MCG/ACT nasal spray PLACE 2 SPRAYS INTO BOTH NOSTRILS 2 (TWO) TIMES DAILY. 16 g 2  . Fluticasone-Umeclidin-Vilant 100-62.5-25 MCG/INH AEPB INHALE 1 INHALER INTO THE LUNGS DAILY. 60 each 6  . furosemide (LASIX) 40 MG tablet Take 1 tablet (40 mg total) by mouth daily as needed (May take 40mg  Daily for Swelling and Shortness of breath). 30 tablet 6  . gabapentin (NEURONTIN) 300 MG capsule Take 1 capsule (300 mg total) by mouth 2 (two) times daily. 60 capsule 6  . HYDROcodone-acetaminophen (NORCO/VICODIN) 5-325 MG tablet Take 1-2 tablets by mouth every 6 (six) hours as needed. 10 tablet 0  . isosorbide mononitrate (IMDUR) 30 MG 24 hr tablet Take 1 tablet (30 mg total) by mouth daily. 30 tablet 3  . metoprolol succinate (TOPROL-XL) 25 MG 24 hr tablet TAKE 1 TABLET (25 MG TOTAL) BY MOUTH DAILY. 90 tablet 3  . nitroGLYCERIN (NITROSTAT) 0.4 MG SL tablet Place 1 tablet (0.4 mg total) under the tongue every 5 (five) minutes as needed for chest pain. 25 tablet 3  . pantoprazole (PROTONIX) 40 MG tablet TAKE 1 TABLET (40 MG TOTAL) BY MOUTH DAILY. 30 tablet 6  . predniSONE (DELTASONE) 10 MG tablet 6 TABS FOR 1 DAY, THEN 5 TABS FOR 1 DAY, THEN 4 TABS FOR 1 DAY, THEN 3 TABS FOR 1 DAY, 2 TABS FOR 1 DAY, THEN 1 TAB FOR 1 DAY  21 tablet 0  . SYMBICORT 160-4.5 MCG/ACT inhaler Inhale 2 puffs into the lungs 2 (two) times daily.    Marland Kitchen tiZANidine (ZANAFLEX) 4 MG tablet TAKE 1 TABLET (4 MG TOTAL) BY MOUTH EVERY 8 (EIGHT) HOURS AS NEEDED FOR MUSCLE SPASMS. 60 tablet 2  . vitamin B-12 (CYANOCOBALAMIN) 1000 MCG tablet Take 1 tablet (1,000 mcg total) by mouth daily. 30 tablet 5   No current facility-administered medications for this visit.    PAST MEDICAL HISTORY: Past Medical History:  Diagnosis Date  . Asthma   . CAD (coronary artery disease)    a. 09/2013 NSTEMI/PCI: LM nl, LAD 40-50%, D1 100 (2.25 x 28 Vision BMS), LCX min irregs, RI 60-70, 30, RCA 40-50/50-49ms/p, EF 45-50%.  b. cath 12/2014 -occulded BMS in diag, 50% pro to mid LAD, 60% ramus, 30% RCA   . Chest pain 08/2015  . Chronic diastolic CHF (congestive heart failure) ([place])    a. 09/2014 EF 45-50% by LV gram;  b. 01/2014 Echo: EF 55-60%, Gr 1 DD.  Marland Kitchen Cocaine abuse (  [place])   . COPD (chronic obstructive pulmonary disease) ([place])   . DVT (deep venous thrombosis) ([place])    "I had ~ 10 in each leg" (12/20/2016)  . Essential hypertension   . Hepatitis C    "clear free over a year now"  . High cholesterol   . Myocardial infarction ([place]) ~ 2014  . Osteoarthritis    a. hands and toes.  . Pneumonia    "several times" (12/20/2016)  . Pulmonary embolism ([place])    a. 2012 - s/p IVC filter;  b. prev on eliquis - noncompliant.  . Shortness of breath dyspnea   . Sinus headache   . Squamous cell skin cancer 07/13/2016   "foot"  . Tobacco abuse     PAST SURGICAL HISTORY: Past Surgical History:  Procedure Laterality Date  . CARDIAC CATHETERIZATION N/A 01/04/2015   Procedure: Left Heart Cath and Coronary Angiography;  Surgeon: Burnell Blanks, MD;  Location: [facility];  Service: Cardiovascular;  Laterality: N/A;  . CORONARY ANGIOPLASTY WITH STENT PLACEMENT  10/21/13   BMS to D1  . CYST EXCISION N/A 05/10/2016   Procedure: EXCISION OF SEBACEOUS CYST UPPER  BACK;  Surgeon: Donnie Mesa, MD;  Location: [facility];  Service: General;  Laterality: N/A;  . FOOT SURGERY Right ~ 06/2016   "said there was skin cancer on it"  . LEFT HEART CATHETERIZATION WITH CORONARY ANGIOGRAM N/A 10/21/2013   Procedure: LEFT HEART CATHETERIZATION WITH CORONARY ANGIOGRAM;  Surgeon: Leonie Man, MD;  Location[facility] C[facility];  Service: Cardiovascular;  Laterality: N/A;  . VENA CAVA FILTER PLACEMENT  ~ 2007-~ 2017   "1 in my neck; 2 in my wrist"    FAMILY HISTORY: Family History  Problem Relation Age of Onset  . Asthma Mother   . Allergies Mother   . Allergies Sister   . Allergies Brother   . Deep vein thrombosis Brother        two brothers with recurrent DVT  . Heart attack Father        a.60s b. deceased in his 38s  . Coronary artery disease Father   . Colon cancer Neg Hx   . Liver cancer Neg Hx   . Esophageal cancer Neg Hx   . Colon polyps Neg Hx   . Rectal cancer Neg Hx   . Pancreatic cancer Neg Hx   . Stomach cancer Neg Hx     SOCIAL HISTORY: Social History   Socioeconomic History  . Marital status: Single    Spouse name: Not on file  . Number of children: Not on file  . Years of education: Not on file  . Highest education level: Not on file  Occupational History  . Occupation: unemployed  Tobacco Use  . Smoking status: Current Every Day Smoker    Packs/day: 0.25    Years: 39.00    Pack years: 9.75    Types: Cigarettes, Cigars  . Smokeless tobacco: Never Used  Vaping Use  . Vaping Use: Never used  Substance and Sexual Activity  . Alcohol use: Yes    Alcohol/week: 6.0 standard drinks    Types: 2 Cans of beer, 4 Shots of liquor per week  . Drug use: Not Currently    Types: Cocaine    Comment: 12/20/2016 "might have used some the other day; I'm not sure"  . Sexual activity: Not Currently  Other Topics Concern  . Not on file  Social History Narrative   Lives in [place].  Social Determinants of Health   Financial Resource Strain: Not on  file  Food Insecurity: Not on file  Transportation Needs: Not on file  Physical Activity: Not on file  Stress: Not on file  Social Connections: Not on file  Intimate Partner Violence: Not on file   PHYSICAL EXAM   Vitals:   08/31/20 1438  BP: (!) 155/102  Pulse: (!) 111  Weight: 221 lb (100.2 kg)  Height: 6\' 2"  (1.88 m)   Not recorded    Body mass index is 28.37 kg/m.  PHYSICAL EXAMNIATION:  Physical Exam  General: The patient is alert and cooperative at the time of the examination.  Skin: No significant peripheral edema is noted.  Neurologic Exam  Mental status: The patient is alert and oriented x 3 at the time of the examination. The patient has apparent normal recent and remote memory, with an apparently normal attention span and concentration ability.  Cranial nerves: Facial symmetry is present. Speech is normal, no aphasia or dysarthria is noted. Extraocular movements are full. Visual fields are full.  Motor: 4/5 bilateral hip flexion weakness, greater on right than left; no significant weakness of upper extremities noted  Sensory examination: Soft touch sensation is symmetric on the face, arms, and legs.  Coordination: The patient has good finger-nose-finger and heel-to-shin bilaterally, but heel to shin feels heavy to him is slow to perform  Gait and station: Gait is wide-based, cautious, stiff  Reflexes: Deep tendon reflexes are symmetric, mildly brisk throughout  DIAGNOSTIC DATA (LABS, IMAGING, TESTING) - I reviewed patient records, labs, notes, testing and imaging myself where available.  ASSESSMENT AND PLAN  [name] is a 60 y.o. male    1. Gait abnormality 2. Chronic low back pain  -Has done 2 cervical ESI with Dr. Trenton Gammon, denies any benefit -Needs to follow back up with Dr. Trenton Gammon to determine next steps, given continued leg pain, weakness, numbness, greater in the right than the left leg; unsteady gait; he has # will call to arrange  appointment -MRI of cervical spine showed moderate C3-4 spinal canal stenosis, multilevel lumbar degenerative changes, L5-S1 with severe right, moderate left foraminal narrowing-early cord compression at C3-4 -When saw Dr. Trenton Gammon Apr 30, 2020 plan for [facility], monitor symptoms for worsening cervical myelopathy -Follow-up here in 6 to 8 months or sooner if needed  Evangeline Dakin, DNP  [facility] [address], [place], [place] [address] [phone]

## 2020-09-01 ENCOUNTER — Other Ambulatory Visit: Payer: Self-pay

## 2020-09-07 ENCOUNTER — Other Ambulatory Visit: Payer: Self-pay

## 2020-09-14 DIAGNOSIS — M48062 Spinal stenosis, lumbar region with neurogenic claudication: Secondary | ICD-10-CM | POA: Diagnosis not present

## 2020-09-14 DIAGNOSIS — M4310 Spondylolisthesis, site unspecified: Secondary | ICD-10-CM | POA: Diagnosis not present

## 2020-09-14 DIAGNOSIS — I10 Essential (primary) hypertension: Secondary | ICD-10-CM | POA: Diagnosis not present

## 2020-09-22 ENCOUNTER — Telehealth: Payer: Self-pay | Admitting: *Deleted

## 2020-09-22 NOTE — Telephone Encounter (Signed)
   Name: [name]  DOB: 12-01-1960  MRN: [id number]   Primary Cardiologist: Elouise Munroe, MD  Chart reviewed as part of pre-operative protocol coverage. Patient was contacted 09/22/2020 in reference to pre-operative risk assessment for pending surgery as outlined below.  [name] was last seen on 08/13/20 by Dr. Margaretann Loveless.  Since that day, [name] has done well. He specifically denies chest pain. His primary complaint is back pain and leg weakness that Dr. Margaretann Loveless feels is not vascular. He understands he is at higher risk for a cardiovascular complication during the perioperative period and agrees to proceed. Reassuring nuclear stress test 04/2019. Please reach out to PCP for eliquis hold as he takes this for history of PE/DVT.  Therefore, based on ACC/AHA guidelines, the patient would be at acceptable risk for the planned procedure without further cardiovascular testing.   The patient was advised that if he develops new symptoms prior to surgery to contact our office to arrange for a follow-up visit, and he verbalized understanding.  I will route this recommendation to the requesting party via Epic fax function and remove from pre-op pool. Please call with questions.  Tami Lin Sherryn Pollino, PA 09/22/2020, 1:14 PM

## 2020-09-22 NOTE — Telephone Encounter (Signed)
   [facility] Pre-operative Risk Assessment    Patient Name: [name]  DOB: 08-18-1960  MRN: [id number]   Request for surgical clearance:  What type of surgery is being performed? L5-S1 lumbar fusion   When is this surgery scheduled? TBD   What type of clearance is required (medical clearance vs. Pharmacy clearance to hold med vs. Both)?  both  Are there any medications that need to be held prior to surgery and how long? Eliquis   Practice name and name of physician performing surgery? [facility] Dr. Annette Stable   What is the office phone number? [phone]   7.   What is the office fax number? [phone] attn: Lorriane Shire  8.   Anesthesia type (None, local, MAC, general) ? general   Cimberly Stoffel A Bolton Canupp 09/22/2020, 10:15 AM  _________________________________________________________________   (provider comments below)

## 2020-09-30 DIAGNOSIS — Z20822 Contact with and (suspected) exposure to covid-19: Secondary | ICD-10-CM | POA: Diagnosis not present

## 2020-10-05 ENCOUNTER — Other Ambulatory Visit: Payer: Self-pay

## 2020-10-05 ENCOUNTER — Other Ambulatory Visit: Payer: Self-pay | Admitting: Family Medicine

## 2020-10-05 MED ORDER — METOPROLOL SUCCINATE ER 25 MG PO TB24
25.0000 mg | ORAL_TABLET | Freq: Every day | ORAL | 1 refills | Status: DC
  Filled 2020-10-05: qty 90, 90d supply, fill #0

## 2020-10-05 MED FILL — Evolocumab Subcutaneous Soln Auto-Injector 140 MG/ML: SUBCUTANEOUS | 84 days supply | Qty: 6 | Fill #1 | Status: AC

## 2020-10-05 NOTE — Telephone Encounter (Signed)
  Notes to clinic: medication filled by a different provider  Review for refill   Requested Prescriptions  Pending Prescriptions Disp Refills   metoprolol succinate (TOPROL-XL) 25 MG 24 hr tablet 90 tablet 3    Sig: TAKE 1 TABLET (25 MG TOTAL) BY MOUTH DAILY.      There is no refill protocol information for this order

## 2020-10-06 ENCOUNTER — Other Ambulatory Visit: Payer: Self-pay

## 2020-10-08 ENCOUNTER — Other Ambulatory Visit: Payer: Self-pay

## 2020-10-12 DIAGNOSIS — Z20822 Contact with and (suspected) exposure to covid-19: Secondary | ICD-10-CM | POA: Diagnosis not present

## 2020-10-15 DIAGNOSIS — Z20822 Contact with and (suspected) exposure to covid-19: Secondary | ICD-10-CM | POA: Diagnosis not present

## 2020-10-19 ENCOUNTER — Other Ambulatory Visit: Payer: Self-pay | Admitting: Neurosurgery

## 2020-10-19 ENCOUNTER — Other Ambulatory Visit: Payer: Self-pay

## 2020-10-19 DIAGNOSIS — Z20822 Contact with and (suspected) exposure to covid-19: Secondary | ICD-10-CM | POA: Diagnosis not present

## 2020-10-22 DIAGNOSIS — Z20822 Contact with and (suspected) exposure to covid-19: Secondary | ICD-10-CM | POA: Diagnosis not present

## 2020-10-24 DIAGNOSIS — Z20822 Contact with and (suspected) exposure to covid-19: Secondary | ICD-10-CM | POA: Diagnosis not present

## 2020-10-28 ENCOUNTER — Other Ambulatory Visit: Payer: Self-pay

## 2020-10-28 ENCOUNTER — Inpatient Hospital Stay (HOSPITAL_COMMUNITY)
Admission: RE | Admit: 2020-10-28 | Discharge: 2020-10-28 | Disposition: A | Discharge status: Home / Self Care | Payer: Medicare Other | Source: Ambulatory Visit

## 2020-10-28 ENCOUNTER — Encounter (HOSPITAL_COMMUNITY): Payer: Self-pay

## 2020-10-28 DIAGNOSIS — Z20822 Contact with and (suspected) exposure to covid-19: Secondary | ICD-10-CM | POA: Diagnosis not present

## 2020-10-28 NOTE — Progress Notes (Signed)
Dr. Annette Stable office was called and the surgery scheduler was notified that Mrs. [name] didn't come in PAT and labs will be done on the day of surgery.

## 2020-10-28 NOTE — Progress Notes (Signed)
Patient didn't come for PAT appointment. This Probation officer called the patient and he said that he is in pain and he can't get up and come for appointment. Patient agreed to receive instructions over the phone.  Patient was instructed to arrive at the hospital at 07:00 o'clock on 11/02/2020. He will not eat or drink anything Monday after midnight; Tuesday morning he will have with a small sip of water Zyrtec, Zetia, Neurontin, Imdur, Metoprolol, Protonix and if needed - Hydrocodone, Zanaflex, Nitroglycerin. Patient verbalized that the last dose of Eliquis will be on 10/30/2020 per MD recommendation.   Patient was instructed to stop taking any Aspirin containing products, vitamins, herbal medication, Goody's, BC's until after surgery. Covid test will be done on 10/29/2020 per patient.  Patient verbalized understanding of these instructions. PA-C was asked to review the chart.

## 2020-10-28 NOTE — Progress Notes (Signed)
Surgical Instructions    Your procedure is scheduled on Tuesday, August 9th, 2022 .  Report to [facility] Main Entrance "A" at 07:00 A.M., then check in with the Admitting office.  Call this number if you have problems the morning of surgery:  [phone]   If you have any questions prior to your surgery date call [phone]: Open Monday-Friday 8am-4pm    Remember:  Do not eat or drink after midnight the night before your surgery    Take these medicines the morning of surgery with A SIP OF WATER:  cetirizine (ZYRTEC)  ezetimibe (ZETIA) fluticasone (FLONASE) Fluticasone-Umeclidin-Vilant - please, bring the inhaler with you gabapentin (NEURONTIN) isosorbide mononitrate (IMDUR) metoprolol succinate (TOPROL-XL) pantoprazole (PROTONIX)  predniSONE (DELTASONE) SYMBICORT 160-4.5 MCG/ACT - please, bring the inhaler with you  If needed:  albuterol (PROAIR HFA) - please, bring the inhaler with you budesonide-formoterol (SYMBICORT) - please, bring the inhaler with you HYDROcodone-acetaminophen (NORCO/VICODIN) nitroGLYCERIN (NITROSTAT)  tiZANidine (ZANAFLEX)  Follow your surgeon's instructions on when to stop Eliquis.  If no instructions were given by your surgeon then you will need to call the office to get those instructions.     As of today, STOP taking any Aspirin (unless otherwise instructed by your surgeon) Aleve, Naproxen, Ibuprofen, Motrin, Advil, Goody's, BC's, all herbal medications, fish oil, and all vitamins.          Do not wear jewelry  Do not wear lotions, powders, colognes, or deodorant. Men may shave face and neck. Do not bring valuables to the hospital. DO Not wear nail polish, gel polish, artificial nails, or any other type of covering on natural nails including finger and toenails. If patients have artificial nails, gel coating, etc. that need to be removed by a nail salon please have this removed prior to surgery or surgery may need to be canceled/delayed if  the surgeon/ anesthesia feels like the patient is unable to be adequately monitored.             [facility] is not responsible for any belongings or valuables.  Do NOT Smoke (Tobacco/Vaping) or drink Alcohol 24 hours prior to your procedure If you use a CPAP at night, you may bring all equipment for your overnight stay.   Contacts, glasses, dentures or bridgework may not be worn into surgery, please bring cases for these belongings   For patients admitted to the hospital, discharge time will be determined by your treatment team.   Patients discharged the day of surgery will not be allowed to drive home, and someone needs to stay with them for 24 hours.  ONLY 1 SUPPORT PERSON MAY BE PRESENT WHILE YOU ARE IN SURGERY. IF YOU ARE TO BE ADMITTED ONCE YOU ARE IN YOUR ROOM YOU WILL BE ALLOWED TWO (2) VISITORS.  Minor children may have two parents present. Special consideration for safety and communication needs will be reviewed on a case by case basis.  Special instructions:    Oral Hygiene is also important to reduce your risk of infection.  Remember - BRUSH YOUR TEETH THE MORNING OF SURGERY WITH YOUR REGULAR TOOTHPASTE   [facility]- Preparing For Surgery  Before surgery, you can play an important role. Because skin is not sterile, your skin needs to be as free of germs as possible. You can reduce the number of germs on your skin by washing with CHG (chlorahexidine gluconate) Soap before surgery.  CHG is an antiseptic cleaner which kills germs and bonds with the skin to continue killing germs  even after washing.     Please do not use if you have an allergy to CHG or antibacterial soaps. If your skin becomes reddened/irritated stop using the CHG.  Do not shave (including legs and underarms) for at least 48 hours prior to first CHG shower. It is OK to shave your face.  Please follow these instructions carefully.     Shower the NIGHT BEFORE SURGERY and the MORNING OF SURGERY with CHG Soap.    If you chose to wash your hair, wash your hair first as usual with your normal shampoo. After you shampoo, rinse your hair and body thoroughly to remove the shampoo.  Then [employer] and genitals (private parts) with your normal soap and rinse thoroughly to remove soap.  After that Use CHG Soap as you would any other liquid soap. You can apply CHG directly to the skin and wash gently with a scrungie or a clean washcloth.   Apply the CHG Soap to your body ONLY FROM THE NECK DOWN.  Do not use on open wounds or open sores. Avoid contact with your eyes, ears, mouth and genitals (private parts). Wash Face and genitals (private parts)  with your normal soap.   Wash thoroughly, paying special attention to the area where your surgery will be performed.  Thoroughly rinse your body with warm water from the neck down.  DO NOT shower/wash with your normal soap after using and rinsing off the CHG Soap.  Pat yourself dry with a CLEAN TOWEL.  Wear CLEAN PAJAMAS to bed the night before surgery  Place CLEAN SHEETS on your bed the night before your surgery  DO NOT SLEEP WITH PETS.   Day of Surgery:  Take a shower with CHG soap. Wear Clean/Comfortable clothing the morning of surgery Do not apply any deodorants/lotions.   Remember to brush your teeth WITH YOUR REGULAR TOOTHPASTE.   Please read over the following fact sheets that you were given.

## 2020-10-29 NOTE — Progress Notes (Signed)
Anesthesia Chart Review: SAME DAY WORK-UP (patient did not show for his 10/28/20 PAT visit)  Case: [id number] Date/Time: 11/02/20 1020   Procedure: PLIF - L5-S1 - Posterior Lateral and Interbody fusion (Back)   Anesthesia type: General   Pre-op diagnosis: Stenosis   Location: [facility] OR ROOM 21 / [facility]   Surgeons: Earnie Larsson, MD       DISCUSSION: Patient is a 60 year old male scheduled for the above procedure.  History includes smoking, COPD. asthma, dyspnea, DVT (BLE, ~ '10 and 11/06/15), PE (2012 s/p IVC filter), CAD (NSTEMI s/p BMS D1 2015, chronically occluded 12/2014 with scattered 30% RCA stenosis, 20% LCx, and 50% Prox-Mid LAD stenosis at that time; history of stable angina 08/13/20), chronic combined systolic and diastolic CHF, HTN, HLD (statin myopathy), illicit drug use (+ UDS cocaine/opiates 01/2016, + opiates only 04/06/17), hepatitis C.   Preoperative cardiology input outlined by Fabian Sharp, [place], "Patient was contacted 09/22/2020 in reference to pre-operative risk assessment for pending surgery as outlined below.  [name] was last seen on 08/13/20 by Dr. Margaretann Loveless.  Since that day, [name] has done well. He specifically denies chest pain. His primary complaint is back pain and leg weakness that Dr. Margaretann Loveless feels is not vascular. He understands he is at higher risk for a cardiovascular complication during the perioperative period and agrees to proceed. Reassuring nuclear stress test 04/2019. Please reach out to PCP for eliquis hold as he takes this for history of PE/DVT.   Therefore, based on ACC/AHA guidelines, the patient would be at acceptable risk for the planned procedure without further cardiovascular testing..."  Patient verbalized that the last dose of Eliquis will be on 10/30/2020 per MD recommendation.      Patient did not show for his 10/28/2020 PAT visit.  He reported he was into much pain.  Surgeon's office notified that he will be a same-day work-up with labs will be  done on arrival.  He is to get preoperative COVID-19 testing on 10/29/2020.  Anesthesia team to evaluate on the day of surgery.   VS:  BP Readings from Last 3 Encounters:  08/31/20 (!) 155/102  08/13/20 140/76  08/09/20 105/67   Pulse Readings from Last 3 Encounters:  08/31/20 (!) 111  08/13/20 66  08/09/20 89     PROVIDERS: Charlott Rakes, MD is PCP  Cherlynn Kaiser, MD is cardiologist last visit 08/13/2020.  31-monthfollow-up with an echocardiogram at that time. YMarcial Pacas MD is neurologist   LABS: He is for labs on the day of surgery. A1c 5.9% 08/09/20.    PFTs 07/14/16: FVC 3.63 (79%), post 3.75 (82%). FEV1 2.74 (76%), post 2.86 (79%). DLCO unc 22.31 (61%).    IMAGES: MRI L-spine 04/23/20: IMPRESSION: Abnormal MRI scan lumbar spine showing prominent spondylitic and facet degenerative changes from L3-S1 most prominent at L5-S1 there is broad-based disc osteophyte protrusion and ligamentum flavum hypertrophy resulting in mild canal and severe right-sided and moderate left-sided foraminal narrowing.  There is moderate foraminal narrowing on the left at L4/5 as well.  MRI C-spine 04/23/20: IMPRESSION: Abnormal MRI scan cervical spine without contrast showing prominent broad-based central disc bulge at C3-4 as well as posterior ligamentum flavum hypertrophy resulting in moderate canal and bilateral foraminal narrowing.  There also mild disc degenerative changes at C4-5 and C5-6 7 again mild canal and bilateral foraminal narrowing.   EKG: 08/13/20: Normal sinus rhythm Minimal voltage criteria for LVH, may be normal variant   CV: Echo 07/08/19: IMPRESSIONS  1. Left ventricular ejection fraction, by estimation, is 40 to 45%. The  left ventricle has mild to moderately decreased function. The left  ventricle demonstrates global hypokinesis. There is mild left ventricular  hypertrophy. Left ventricular diastolic  parameters are consistent with Grade I diastolic dysfunction  (impaired  relaxation). There is abnormal septal motion.   2. Right ventricular systolic function is normal. The right ventricular  size is normal.   3. The mitral valve is normal in structure. Trivial mitral valve  regurgitation. No evidence of mitral stenosis.   4. The aortic valve is tricuspid. Aortic valve regurgitation is not  visualized. No aortic stenosis is present.   5. Aortic dilatation noted. There is mild dilatation of the aortic root  measuring 40 mm.  - Comparison(s): A prior study was performed on 11/29/2017. No significant  change from prior study. Prior images reviewed side by side.    Nuclear stress test 05/07/19: The left ventricular ejection fraction is mildly decreased (45-54%). Nuclear stress EF: 51%. There was no ST segment deviation noted during stress. Defect 1: There is a small defect of mild severity present in the mid anterior and apical anterior location. This is a low risk study. Perfusion unchanged from prior study. There is a small, mild, fixed defect in the mid anterior wall. There is no ischemia. EF is calculated as low normal, but there are no clear focal wall motion defects seen on this study. Perfusion is similar to prior study. Low risk.    Cardiac cath 01/04/15: Prox RCA lesion, 30% stenosed. Mid RCA to Dist RCA lesion, 30% stenosed. Ost Cx to Prox Cx lesion, 20% stenosed. Ramus lesion, 60% stenosed. Prox LAD to Mid LAD lesion, 50% stenosed. Ost 1st Diag to 1st Diag lesion, 100% stenosed. The lesion was previously treated with a bare metal stentone to two years ago. The left ventricular systolic function is normal. 1. Double vessel CAD 2. Moderate stenosis moderate caliber Ramus Intermediate branch, does not appear to be flow limiting. 3. Mild to moderate disease in the LAD, RCA and Circumflex. 4. Chronic total occlusion bare metal stent in Diagonal branch. 5. Low normal LV systolic function.  Recommendations: Continue medical management of  CAD. He admits today to continued tobacco abuse and medication non-compliance. He responded poorly to bare metal stenting in the past. I would maximize his anti-anginal medications and encourage tobacco cessation. He can be discharged home later today. Follow up with Dr. Loralie Champagne following discharge.     Past Medical History:  Diagnosis Date   Asthma    CAD (coronary artery disease)    a. 09/2013 NSTEMI/PCI: LM nl, LAD 40-50%, D1 100 (2.25 x 28 Vision BMS), LCX min irregs, RI 60-70, 30, RCA 40-50/50-82m/p, EF 45-50%.  b. cath 12/2014 -occulded BMS in diag, 50% pro to mid LAD, 60% ramus, 30% RCA    Chest pain 08/2015   Chronic diastolic CHF (congestive heart failure) ([facility]    a. 09/2014 EF 45-50% by LV gram;  b. 01/2014 Echo: EF 55-60%, Gr 1 DD.   Cocaine abuse ([facility]    COPD (chronic obstructive pulmonary disease) ([facility]    DVT (deep venous thrombosis) ([facility]    "I had ~ 10 in each leg" (12/20/2016)   Essential hypertension    Hepatitis C    "clear free over a year now"   High cholesterol    Myocardial infarction ([facility] ~ 2014   Osteoarthritis    a. hands and toes.   Pneumonia    "  several times" (12/20/2016)   Pulmonary embolism ([place])    a. 2012 - s/p IVC filter;  b. prev on eliquis - noncompliant.   Shortness of breath dyspnea    Sinus headache    Squamous cell skin cancer 07/13/2016   "foot"   Tobacco abuse     Past Surgical History:  Procedure Laterality Date   CARDIAC CATHETERIZATION N/A 01/04/2015   Procedure: Left Heart Cath and Coronary Angiography;  Surgeon: Burnell Blanks, MD;  Location: [place] CV LAB;  Service: Cardiovascular;  Laterality: N/A;   CORONARY ANGIOPLASTY WITH STENT PLACEMENT  10/21/13   BMS to D1   CYST EXCISION N/A 05/10/2016   Procedure: EXCISION OF SEBACEOUS CYST UPPER BACK;  Surgeon: Donnie Mesa, MD;  Location: [place];  Service: General;  Laterality: N/A;   FOOT SURGERY Right ~ 06/2016   "said there was skin cancer on it"   LEFT HEART  CATHETERIZATION WITH CORONARY ANGIOGRAM N/A 10/21/2013   Procedure: LEFT HEART CATHETERIZATION WITH CORONARY ANGIOGRAM;  Surgeon: Leonie Man, MD;  [facility]: [facility] CATH LAB;  Service: Cardiovascular;  Laterality: N/A;   VENA CAVA FILTER PLACEMENT  ~ 2007-~ 2017   "1 in my neck; 2 in my wrist"    MEDICATIONS:  albuterol (PROAIR HFA) 108 (90 Base) MCG/ACT inhaler   albuterol (PROVENTIL) (2.5 MG/3ML) 0.083% nebulizer solution   apixaban (ELIQUIS) 5 MG TABS tablet   budesonide-formoterol (SYMBICORT) 160-4.5 MCG/ACT inhaler   cetirizine (ZYRTEC) 10 MG tablet   Evolocumab 140 MG/ML SOAJ   ezetimibe (ZETIA) 10 MG tablet   fluticasone (FLONASE) 50 MCG/ACT nasal spray   Fluticasone-Umeclidin-Vilant 100-62.5-25 MCG/INH AEPB   furosemide (LASIX) 40 MG tablet   gabapentin (NEURONTIN) 300 MG capsule   HYDROcodone-acetaminophen (NORCO/VICODIN) 5-325 MG tablet   isosorbide mononitrate (IMDUR) 30 MG 24 hr tablet   metoprolol succinate (TOPROL-XL) 25 MG 24 hr tablet   nitroGLYCERIN (NITROSTAT) 0.4 MG SL tablet   pantoprazole (PROTONIX) 40 MG tablet   predniSONE (DELTASONE) 10 MG tablet   SYMBICORT 160-4.5 MCG/ACT inhaler   tiZANidine (ZANAFLEX) 4 MG tablet   vitamin B-12 (CYANOCOBALAMIN) 1000 MCG tablet   No current facility-administered medications for this encounter.    Myra Gianotti, PA-C Surgical Short Stay/Anesthesiology [facility] Phone [phone] [facility] Phone [phone] 10/29/2020 12:30 PM

## 2020-10-29 NOTE — Anesthesia Preprocedure Evaluation (Addendum)
Anesthesia Evaluation  Patient identified by MRN, date of birth, ID band Patient awake    Reviewed: Allergy & Precautions, NPO status , Patient's Chart, lab work & pertinent test results, reviewed documented beta blocker date and time   Airway Mallampati: II  TM Distance: >3 FB Neck ROM: Full    Dental  (+) Dental Advisory Given, Lower Dentures, Upper Dentures   Pulmonary shortness of breath and with exertion, asthma , pneumonia, resolved, COPD,  COPD inhaler, Current Smoker and Patient abstained from smoking., PE Hx/o PTE 2012   Pulmonary exam normal breath sounds clear to auscultation       Cardiovascular hypertension, Pt. on medications and Pt. on home beta blockers + angina with exertion + CAD, + Past MI, +CHF and + DVT  Normal cardiovascular exam Rhythm:Regular Rate:Normal  EKG 08/13/20 NSR, LVH   Echo 07/08/19 1. Left ventricular ejection fraction, by estimation, is 40 to 45%. The left ventricle has mild to moderately decreased function. The left ventricle demonstrates global hypokinesis. There is mild left ventricular hypertrophy. Left ventricular diastolic parameters are consistent with Grade I diastolic dysfunction (impaired relaxation). There is abnormal septal motion.  2. Right ventricular systolic function is normal. The right ventricular size is normal.  3. The mitral valve is normal in structure. Trivial mitral valve regurgitation. No evidence of mitral stenosis.  4. The aortic valve is tricuspid. Aortic valve regurgitation is not visualized. No aortic stenosis is present.  5. Aortic dilatation noted. There is mild dilatation of the aortic root measuring 40 mm.   Hx/o IVC filter 2012   Neuro/Psych  Headaches,  Neuromuscular disease negative psych ROS   GI/Hepatic GERD  Medicated,(+) Hepatitis -, CTreated Hepatitis C   Endo/Other  Hyperlipidemia  Renal/GU negative Renal ROS  negative genitourinary    Musculoskeletal  (+) Arthritis , Osteoarthritis,  Lumbar stenosis L5-S1 right radiculopathy and numbness right foot   Abdominal   Peds  Hematology  (+) anemia , Eliquis therapy- last dose 8/5   Anesthesia Other Findings   Reproductive/Obstetrics                           Anesthesia Physical Anesthesia Plan  ASA: 3  Anesthesia Plan: General   Post-op Pain Management:    Induction: Intravenous  PONV Risk Score and Plan: 2 and Treatment may vary due to age or medical condition  Airway Management Planned: Oral ETT  Additional Equipment:   Intra-op Plan:   Post-operative Plan: Extubation in OR  Informed Consent: I have reviewed the patients History and Physical, chart, labs and discussed the procedure including the risks, benefits and alternatives for the proposed anesthesia with the patient or authorized representative who has indicated his/her understanding and acceptance.     Dental advisory given  Plan Discussed with: CRNA and Anesthesiologist  Anesthesia Plan Comments: (PAT note written 10/29/2020 by Myra Gianotti, PA-C. )       Anesthesia Quick Evaluation

## 2020-11-02 ENCOUNTER — Encounter (HOSPITAL_COMMUNITY): Payer: Self-pay | Admitting: Neurosurgery

## 2020-11-02 ENCOUNTER — Encounter (HOSPITAL_COMMUNITY)
Admission: RE | Disposition: A | Discharge status: Home / Self Care | Payer: Self-pay | Source: Home / Self Care | Attending: Neurosurgery

## 2020-11-02 ENCOUNTER — Inpatient Hospital Stay (HOSPITAL_COMMUNITY)
Admission: RE | Admit: 2020-11-02 | Discharge: 2020-11-03 | DRG: 454 | Disposition: A | Discharge status: Home / Self Care | Payer: Medicare Other | Attending: Neurosurgery | Admitting: Neurosurgery

## 2020-11-02 ENCOUNTER — Ambulatory Visit (HOSPITAL_COMMUNITY): Payer: Medicare Other | Admitting: Vascular Surgery

## 2020-11-02 ENCOUNTER — Ambulatory Visit (HOSPITAL_COMMUNITY): Payer: Medicare Other | Admitting: Anesthesiology

## 2020-11-02 ENCOUNTER — Ambulatory Visit (HOSPITAL_COMMUNITY): Payer: Medicare Other

## 2020-11-02 DIAGNOSIS — Z79899 Other long term (current) drug therapy: Secondary | ICD-10-CM

## 2020-11-02 DIAGNOSIS — M5417 Radiculopathy, lumbosacral region: Secondary | ICD-10-CM | POA: Diagnosis not present

## 2020-11-02 DIAGNOSIS — Z7951 Long term (current) use of inhaled steroids: Secondary | ICD-10-CM | POA: Diagnosis not present

## 2020-11-02 DIAGNOSIS — F17290 Nicotine dependence, other tobacco product, uncomplicated: Secondary | ICD-10-CM | POA: Diagnosis not present

## 2020-11-02 DIAGNOSIS — Z85828 Personal history of other malignant neoplasm of skin: Secondary | ICD-10-CM

## 2020-11-02 DIAGNOSIS — E785 Hyperlipidemia, unspecified: Secondary | ICD-10-CM | POA: Diagnosis not present

## 2020-11-02 DIAGNOSIS — Z981 Arthrodesis status: Secondary | ICD-10-CM | POA: Diagnosis not present

## 2020-11-02 DIAGNOSIS — Z86718 Personal history of other venous thrombosis and embolism: Secondary | ICD-10-CM | POA: Diagnosis not present

## 2020-11-02 DIAGNOSIS — M4807 Spinal stenosis, lumbosacral region: Principal | ICD-10-CM | POA: Diagnosis present

## 2020-11-02 DIAGNOSIS — K219 Gastro-esophageal reflux disease without esophagitis: Secondary | ICD-10-CM | POA: Diagnosis not present

## 2020-11-02 DIAGNOSIS — Z825 Family history of asthma and other chronic lower respiratory diseases: Secondary | ICD-10-CM

## 2020-11-02 DIAGNOSIS — E7800 Pure hypercholesterolemia, unspecified: Secondary | ICD-10-CM | POA: Diagnosis not present

## 2020-11-02 DIAGNOSIS — Z7901 Long term (current) use of anticoagulants: Secondary | ICD-10-CM | POA: Diagnosis not present

## 2020-11-02 DIAGNOSIS — I252 Old myocardial infarction: Secondary | ICD-10-CM

## 2020-11-02 DIAGNOSIS — F17210 Nicotine dependence, cigarettes, uncomplicated: Secondary | ICD-10-CM | POA: Diagnosis present

## 2020-11-02 DIAGNOSIS — I2510 Atherosclerotic heart disease of native coronary artery without angina pectoris: Secondary | ICD-10-CM | POA: Diagnosis not present

## 2020-11-02 DIAGNOSIS — Z8619 Personal history of other infectious and parasitic diseases: Secondary | ICD-10-CM | POA: Diagnosis not present

## 2020-11-02 DIAGNOSIS — Z955 Presence of coronary angioplasty implant and graft: Secondary | ICD-10-CM

## 2020-11-02 DIAGNOSIS — Z86711 Personal history of pulmonary embolism: Secondary | ICD-10-CM

## 2020-11-02 DIAGNOSIS — Z8701 Personal history of pneumonia (recurrent): Secondary | ICD-10-CM

## 2020-11-02 DIAGNOSIS — J449 Chronic obstructive pulmonary disease, unspecified: Secondary | ICD-10-CM | POA: Diagnosis present

## 2020-11-02 DIAGNOSIS — Z888 Allergy status to other drugs, medicaments and biological substances status: Secondary | ICD-10-CM

## 2020-11-02 DIAGNOSIS — M4317 Spondylolisthesis, lumbosacral region: Secondary | ICD-10-CM | POA: Diagnosis not present

## 2020-11-02 DIAGNOSIS — M1990 Unspecified osteoarthritis, unspecified site: Secondary | ICD-10-CM | POA: Diagnosis not present

## 2020-11-02 DIAGNOSIS — M4310 Spondylolisthesis, site unspecified: Secondary | ICD-10-CM | POA: Diagnosis present

## 2020-11-02 DIAGNOSIS — Z95828 Presence of other vascular implants and grafts: Secondary | ICD-10-CM | POA: Diagnosis not present

## 2020-11-02 DIAGNOSIS — I110 Hypertensive heart disease with heart failure: Secondary | ICD-10-CM | POA: Diagnosis not present

## 2020-11-02 DIAGNOSIS — Z419 Encounter for procedure for purposes other than remedying health state, unspecified: Secondary | ICD-10-CM

## 2020-11-02 DIAGNOSIS — F1410 Cocaine abuse, uncomplicated: Secondary | ICD-10-CM | POA: Diagnosis present

## 2020-11-02 DIAGNOSIS — Z20822 Contact with and (suspected) exposure to covid-19: Secondary | ICD-10-CM | POA: Diagnosis not present

## 2020-11-02 DIAGNOSIS — I5032 Chronic diastolic (congestive) heart failure: Secondary | ICD-10-CM | POA: Diagnosis present

## 2020-11-02 DIAGNOSIS — M4326 Fusion of spine, lumbar region: Secondary | ICD-10-CM | POA: Diagnosis not present

## 2020-11-02 DIAGNOSIS — Z972 Presence of dental prosthetic device (complete) (partial): Secondary | ICD-10-CM

## 2020-11-02 DIAGNOSIS — Z8249 Family history of ischemic heart disease and other diseases of the circulatory system: Secondary | ICD-10-CM

## 2020-11-02 LAB — CBC WITH DIFFERENTIAL/PLATELET
Abs Immature Granulocytes: 0.04 10*3/uL (ref 0.00–0.07)
Basophils Absolute: 0 10*3/uL (ref 0.0–0.1)
Basophils Relative: 1 %
Eosinophils Absolute: 0.8 10*3/uL — ABNORMAL HIGH (ref 0.0–0.5)
Eosinophils Relative: 13 %
HCT: 41.2 % (ref 39.0–52.0)
Hemoglobin: 13.4 g/dL (ref 13.0–17.0)
Immature Granulocytes: 1 %
Lymphocytes Relative: 26 %
Lymphs Abs: 1.6 10*3/uL (ref 0.7–4.0)
MCH: 30.9 pg (ref 26.0–34.0)
MCHC: 32.5 g/dL (ref 30.0–36.0)
MCV: 95.2 fL (ref 80.0–100.0)
Monocytes Absolute: 0.5 10*3/uL (ref 0.1–1.0)
Monocytes Relative: 9 %
Neutro Abs: 3.1 10*3/uL (ref 1.7–7.7)
Neutrophils Relative %: 50 %
Platelets: 176 10*3/uL (ref 150–400)
RBC: 4.33 MIL/uL (ref 4.22–5.81)
RDW: 13.7 % (ref 11.5–15.5)
WBC: 6.2 10*3/uL (ref 4.0–10.5)
nRBC: 0 % (ref 0.0–0.2)

## 2020-11-02 LAB — BASIC METABOLIC PANEL
Anion gap: 9 (ref 5–15)
BUN: 10 mg/dL (ref 6–20)
CO2: 21 mmol/L — ABNORMAL LOW (ref 22–32)
Calcium: 9.1 mg/dL (ref 8.9–10.3)
Chloride: 106 mmol/L (ref 98–111)
Creatinine, Ser: 0.76 mg/dL (ref 0.61–1.24)
GFR, Estimated: >60 mL/min (ref >60)
Glucose, Bld: 114 mg/dL — ABNORMAL HIGH (ref 70–99)
Potassium: 3.5 mmol/L (ref 3.5–5.1)
Sodium: 136 mmol/L (ref 135–145)

## 2020-11-02 LAB — SURGICAL PCR SCREEN
MRSA, PCR: NEGATIVE
Staphylococcus aureus: NEGATIVE

## 2020-11-02 LAB — TYPE AND SCREEN
ABO/RH(D): O POS
Antibody Screen: NEGATIVE

## 2020-11-02 LAB — ABO/RH: ABO/RH(D): O POS

## 2020-11-02 LAB — SARS CORONAVIRUS 2 BY RT PCR (HOSPITAL ORDER, PERFORMED IN ~~LOC~~ HOSPITAL LAB): SARS Coronavirus 2: NEGATIVE

## 2020-11-02 SURGERY — POSTERIOR LUMBAR FUSION 1 LEVEL
Anesthesia: General | Site: Back

## 2020-11-02 MED ORDER — LACTATED RINGERS IV SOLN: INTRAVENOUS | Status: DC

## 2020-11-02 MED ORDER — ISOSORBIDE MONONITRATE ER 30 MG PO TB24
30.0000 mg | ORAL_TABLET | Freq: Every day | ORAL | Status: DC
  Administered 2020-11-02 – 2020-11-03 (×2): 30 mg via ORAL
  Filled 2020-11-02 (×2): qty 1

## 2020-11-02 MED ORDER — OXYCODONE HCL 5 MG/5ML PO SOLN: 5.0000 mg | Freq: Once | ORAL | Status: AC | PRN

## 2020-11-02 MED ORDER — METOPROLOL SUCCINATE ER 25 MG PO TB24
25.0000 mg | ORAL_TABLET | Freq: Every day | ORAL | Status: DC
  Administered 2020-11-03: 25 mg via ORAL
  Filled 2020-11-02 (×2): qty 1

## 2020-11-02 MED ORDER — ROCURONIUM BROMIDE 10 MG/ML (PF) SYRINGE
PREFILLED_SYRINGE | INTRAVENOUS | Status: DC | PRN
  Administered 2020-11-02 (×2): 60 mg via INTRAVENOUS

## 2020-11-02 MED ORDER — SODIUM CHLORIDE 0.9% FLUSH: 3.0000 mL | INTRAVENOUS | Status: DC | PRN

## 2020-11-02 MED ORDER — LIDOCAINE 2% (20 MG/ML) 5 ML SYRINGE
INTRAMUSCULAR | Status: DC | PRN
  Administered 2020-11-02 (×2): 80 mg via INTRAVENOUS

## 2020-11-02 MED ORDER — ACETAMINOPHEN 325 MG PO TABS
650.0000 mg | ORAL_TABLET | ORAL | Status: DC | PRN
  Administered 2020-11-02: 650 mg via ORAL
  Filled 2020-11-02 (×2): qty 2

## 2020-11-02 MED ORDER — PROPOFOL 10 MG/ML IV BOLUS
INTRAVENOUS | Status: AC
  Filled 2020-11-02: qty 20

## 2020-11-02 MED ORDER — CHLORHEXIDINE GLUCONATE 0.12 % MT SOLN
15.0000 mL | Freq: Once | OROMUCOSAL | Status: AC
  Administered 2020-11-02: 15 mL via OROMUCOSAL
  Filled 2020-11-02: qty 15

## 2020-11-02 MED ORDER — DIAZEPAM 5 MG PO TABS
5.0000 mg | ORAL_TABLET | Freq: Four times a day (QID) | ORAL | Status: DC | PRN
  Administered 2020-11-02: 5 mg via ORAL
  Filled 2020-11-02: qty 1

## 2020-11-02 MED ORDER — MIDAZOLAM HCL 2 MG/2ML IJ SOLN
INTRAMUSCULAR | Status: DC | PRN
  Administered 2020-11-02: 2 mg via INTRAVENOUS

## 2020-11-02 MED ORDER — THROMBIN 20000 UNITS EX SOLR
CUTANEOUS | Status: AC
  Filled 2020-11-02: qty 20000

## 2020-11-02 MED ORDER — ONDANSETRON HCL 4 MG PO TABS: 4.0000 mg | ORAL_TABLET | Freq: Four times a day (QID) | ORAL | Status: DC | PRN

## 2020-11-02 MED ORDER — ORAL CARE MOUTH RINSE: 15.0000 mL | Freq: Once | OROMUCOSAL | Status: AC

## 2020-11-02 MED ORDER — EZETIMIBE 10 MG PO TABS
10.0000 mg | ORAL_TABLET | Freq: Every day | ORAL | Status: DC
  Administered 2020-11-03: 10 mg via ORAL
  Filled 2020-11-02: qty 1

## 2020-11-02 MED ORDER — CHLORHEXIDINE GLUCONATE CLOTH 2 % EX PADS: 6.0000 | MEDICATED_PAD | Freq: Once | CUTANEOUS | Status: DC

## 2020-11-02 MED ORDER — MOMETASONE FURO-FORMOTEROL FUM 200-5 MCG/ACT IN AERO
2.0000 | INHALATION_SPRAY | Freq: Two times a day (BID) | RESPIRATORY_TRACT | Status: DC
  Filled 2020-11-02: qty 8.8

## 2020-11-02 MED ORDER — OXYCODONE HCL 5 MG PO TABS
ORAL_TABLET | ORAL | Status: AC
  Filled 2020-11-02: qty 1

## 2020-11-02 MED ORDER — HYDROMORPHONE HCL 1 MG/ML IJ SOLN
INTRAMUSCULAR | Status: AC
  Administered 2020-11-02: 1 mg
  Filled 2020-11-02: qty 1

## 2020-11-02 MED ORDER — LORATADINE 10 MG PO TABS
10.0000 mg | ORAL_TABLET | Freq: Every day | ORAL | Status: DC
  Administered 2020-11-03: 10 mg via ORAL
  Filled 2020-11-02: qty 1

## 2020-11-02 MED ORDER — FLEET ENEMA 7-19 GM/118ML RE ENEM: 1.0000 | ENEMA | Freq: Once | RECTAL | Status: DC | PRN

## 2020-11-02 MED ORDER — HYDROCODONE-ACETAMINOPHEN 10-325 MG PO TABS
1.0000 | ORAL_TABLET | ORAL | Status: DC | PRN
Start: 2020-11-02 — End: 2020-11-03

## 2020-11-02 MED ORDER — BUPIVACAINE HCL (PF) 0.25 % IJ SOLN
INTRAMUSCULAR | Status: DC | PRN
  Administered 2020-11-02: 20 mL

## 2020-11-02 MED ORDER — NITROGLYCERIN 0.4 MG SL SUBL: 0.4000 mg | SUBLINGUAL_TABLET | SUBLINGUAL | Status: DC | PRN

## 2020-11-02 MED ORDER — ONDANSETRON HCL 4 MG/2ML IJ SOLN
INTRAMUSCULAR | Status: AC
  Filled 2020-11-02: qty 2

## 2020-11-02 MED ORDER — OXYCODONE HCL 5 MG PO TABS
5.0000 mg | ORAL_TABLET | Freq: Once | ORAL | Status: AC | PRN
  Administered 2020-11-02: 5 mg via ORAL

## 2020-11-02 MED ORDER — CEFAZOLIN SODIUM-DEXTROSE 1-4 GM/50ML-% IV SOLN
1.0000 g | Freq: Three times a day (TID) | INTRAVENOUS | Status: AC
Start: 2020-11-02 — End: 2020-11-03
  Administered 2020-11-02 – 2020-11-03 (×2): 1 g via INTRAVENOUS
  Filled 2020-11-02 (×3): qty 50

## 2020-11-02 MED ORDER — ALBUTEROL SULFATE HFA 108 (90 BASE) MCG/ACT IN AERS: 2.0000 | INHALATION_SPRAY | Freq: Four times a day (QID) | RESPIRATORY_TRACT | Status: DC | PRN

## 2020-11-02 MED ORDER — FLUTICASONE-UMECLIDIN-VILANT 100-62.5-25 MCG/INH IN AEPB: 1.0000 | INHALATION_SPRAY | Freq: Every day | RESPIRATORY_TRACT | Status: DC

## 2020-11-02 MED ORDER — PHENOL 1.4 % MT LIQD: 1.0000 | OROMUCOSAL | Status: DC | PRN

## 2020-11-02 MED ORDER — SODIUM CHLORIDE 0.9 % IV SOLN: 250.0000 mL | INTRAVENOUS | Status: DC

## 2020-11-02 MED ORDER — OXYCODONE HCL 5 MG PO TABS
10.0000 mg | ORAL_TABLET | ORAL | Status: DC | PRN
  Administered 2020-11-02 – 2020-11-03 (×4): 10 mg via ORAL
  Filled 2020-11-02 (×4): qty 2

## 2020-11-02 MED ORDER — FUROSEMIDE 40 MG PO TABS: 40.0000 mg | ORAL_TABLET | Freq: Every day | ORAL | Status: DC | PRN

## 2020-11-02 MED ORDER — ACETAMINOPHEN 650 MG RE SUPP: 650.0000 mg | RECTAL | Status: DC | PRN

## 2020-11-02 MED ORDER — MENTHOL 3 MG MT LOZG: 1.0000 | LOZENGE | OROMUCOSAL | Status: DC | PRN

## 2020-11-02 MED ORDER — HYDROMORPHONE HCL 1 MG/ML IJ SOLN
1.0000 mg | INTRAMUSCULAR | Status: DC | PRN
  Administered 2020-11-02 (×2): 1 mg via INTRAVENOUS
  Filled 2020-11-02 (×2): qty 1

## 2020-11-02 MED ORDER — MOMETASONE FURO-FORMOTEROL FUM 200-5 MCG/ACT IN AERO: 2.0000 | INHALATION_SPRAY | Freq: Two times a day (BID) | RESPIRATORY_TRACT | Status: DC

## 2020-11-02 MED ORDER — SODIUM CHLORIDE 0.9% FLUSH
3.0000 mL | Freq: Two times a day (BID) | INTRAVENOUS | Status: DC
  Administered 2020-11-02: 3 mL via INTRAVENOUS

## 2020-11-02 MED ORDER — HYDROMORPHONE HCL 1 MG/ML IJ SOLN
0.2500 mg | INTRAMUSCULAR | Status: DC | PRN
  Administered 2020-11-02 (×2): 0.5 mg via INTRAVENOUS

## 2020-11-02 MED ORDER — GABAPENTIN 300 MG PO CAPS
300.0000 mg | ORAL_CAPSULE | Freq: Two times a day (BID) | ORAL | Status: DC
  Administered 2020-11-02 – 2020-11-03 (×2): 300 mg via ORAL
  Filled 2020-11-02 (×2): qty 1

## 2020-11-02 MED ORDER — VANCOMYCIN HCL 1000 MG IV SOLR
INTRAVENOUS | Status: AC
  Filled 2020-11-02: qty 1000

## 2020-11-02 MED ORDER — VITAMIN B-12 1000 MCG PO TABS
1000.0000 ug | ORAL_TABLET | Freq: Every day | ORAL | Status: DC
  Administered 2020-11-03: 1000 ug via ORAL
  Filled 2020-11-02: qty 1

## 2020-11-02 MED ORDER — BISACODYL 10 MG RE SUPP: 10.0000 mg | Freq: Every day | RECTAL | Status: DC | PRN

## 2020-11-02 MED ORDER — VANCOMYCIN HCL 1000 MG IV SOLR
INTRAVENOUS | Status: DC | PRN
  Administered 2020-11-02: 1000 mg via TOPICAL

## 2020-11-02 MED ORDER — MIDAZOLAM HCL 2 MG/2ML IJ SOLN
INTRAMUSCULAR | Status: AC
  Filled 2020-11-02: qty 2

## 2020-11-02 MED ORDER — ONDANSETRON HCL 4 MG/2ML IJ SOLN: 4.0000 mg | Freq: Four times a day (QID) | INTRAMUSCULAR | Status: DC | PRN

## 2020-11-02 MED ORDER — ROCURONIUM BROMIDE 10 MG/ML (PF) SYRINGE
PREFILLED_SYRINGE | INTRAVENOUS | Status: AC
  Filled 2020-11-02: qty 10

## 2020-11-02 MED ORDER — CEFAZOLIN SODIUM-DEXTROSE 2-4 GM/100ML-% IV SOLN
2.0000 g | INTRAVENOUS | Status: AC
  Administered 2020-11-02: 2 g via INTRAVENOUS
  Filled 2020-11-02: qty 100

## 2020-11-02 MED ORDER — METOPROLOL SUCCINATE ER 25 MG PO TB24
ORAL_TABLET | ORAL | Status: AC
  Administered 2020-11-02: 25 mg via ORAL
  Filled 2020-11-02: qty 1

## 2020-11-02 MED ORDER — CHLORHEXIDINE GLUCONATE CLOTH 2 % EX PADS
6.0000 | MEDICATED_PAD | Freq: Once | CUTANEOUS | Status: AC
  Administered 2020-11-02: 6 via TOPICAL

## 2020-11-02 MED ORDER — PROPOFOL 10 MG/ML IV BOLUS
INTRAVENOUS | Status: DC | PRN
  Administered 2020-11-02 (×2): 150 mg via INTRAVENOUS

## 2020-11-02 MED ORDER — FLUTICASONE PROPIONATE 50 MCG/ACT NA SUSP
2.0000 | Freq: Every day | NASAL | Status: DC
  Filled 2020-11-02: qty 16

## 2020-11-02 MED ORDER — DEXAMETHASONE SODIUM PHOSPHATE 10 MG/ML IJ SOLN
10.0000 mg | Freq: Once | INTRAMUSCULAR | Status: AC
  Administered 2020-11-02: 10 mg via INTRAVENOUS
  Filled 2020-11-02: qty 1

## 2020-11-02 MED ORDER — ESMOLOL HCL 100 MG/10ML IV SOLN
INTRAVENOUS | Status: AC
  Filled 2020-11-02: qty 10

## 2020-11-02 MED ORDER — METOPROLOL SUCCINATE ER 25 MG PO TB24: 25.0000 mg | ORAL_TABLET | Freq: Every day | ORAL | Status: DC

## 2020-11-02 MED ORDER — POLYETHYLENE GLYCOL 3350 17 G PO PACK: 17.0000 g | PACK | Freq: Every day | ORAL | Status: DC | PRN

## 2020-11-02 MED ORDER — ONDANSETRON HCL 4 MG/2ML IJ SOLN
INTRAMUSCULAR | Status: DC | PRN
  Administered 2020-11-02: 4 mg via INTRAVENOUS

## 2020-11-02 MED ORDER — LIDOCAINE 2% (20 MG/ML) 5 ML SYRINGE
INTRAMUSCULAR | Status: AC
  Filled 2020-11-02: qty 5

## 2020-11-02 MED ORDER — SUFENTANIL CITRATE 50 MCG/ML IV SOLN
INTRAVENOUS | Status: DC | PRN
  Administered 2020-11-02: 10 ug via INTRAVENOUS
  Administered 2020-11-02: 15 ug via INTRAVENOUS
  Administered 2020-11-02: 5 ug via INTRAVENOUS
  Administered 2020-11-02: 10 ug via INTRAVENOUS

## 2020-11-02 MED ORDER — ESMOLOL HCL 100 MG/10ML IV SOLN
INTRAVENOUS | Status: DC | PRN
  Administered 2020-11-02: 20 mg via INTRAVENOUS

## 2020-11-02 MED ORDER — SUFENTANIL CITRATE 50 MCG/ML IV SOLN
INTRAVENOUS | Status: AC
  Filled 2020-11-02: qty 1

## 2020-11-02 MED ORDER — ALBUTEROL SULFATE (2.5 MG/3ML) 0.083% IN NEBU: 2.5000 mg | INHALATION_SOLUTION | Freq: Four times a day (QID) | RESPIRATORY_TRACT | Status: DC | PRN

## 2020-11-02 MED ORDER — BUPIVACAINE HCL (PF) 0.25 % IJ SOLN
INTRAMUSCULAR | Status: AC
  Filled 2020-11-02: qty 30

## 2020-11-02 MED ORDER — THROMBIN 20000 UNITS EX SOLR: CUTANEOUS | Status: DC | PRN

## 2020-11-02 MED ORDER — 0.9 % SODIUM CHLORIDE (POUR BTL) OPTIME
TOPICAL | Status: DC | PRN
  Administered 2020-11-02: 1000 mL

## 2020-11-02 MED ORDER — HYDROMORPHONE HCL 1 MG/ML IJ SOLN
INTRAMUSCULAR | Status: AC
  Filled 2020-11-02: qty 1

## 2020-11-02 SURGICAL SUPPLY — 66 items
ADH SKN CLS APL DERMABOND .7 (GAUZE/BANDAGES/DRESSINGS) ×2
APL SKNCLS STERI-STRIP NONHPOA (GAUZE/BANDAGES/DRESSINGS) ×2
BAG COUNTER SPONGE SURGICOUNT (BAG) ×3 IMPLANT
BAG DECANTER FOR FLEXI CONT (MISCELLANEOUS) ×3 IMPLANT
BAG SPNG CNTER NS LX DISP (BAG) ×2
BENZOIN TINCTURE PRP APPL 2/3 (GAUZE/BANDAGES/DRESSINGS) ×3 IMPLANT
BLADE CLIPPER SURG (BLADE) IMPLANT
BONE GRAFTON DBF INJECT 3CC (Bone Implant) ×2 IMPLANT
BUR CUTTER 7.0 ROUND (BURR) IMPLANT
BUR MATCHSTICK NEURO 3.0 LAGG (BURR) ×3 IMPLANT
CANISTER SUCT 3000ML PPV (MISCELLANEOUS) ×3 IMPLANT
CAP LCK SPNE (Orthopedic Implant) ×8 IMPLANT
CAP LOCK SPINE RADIUS (Orthopedic Implant) ×4 IMPLANT
CAP LOCKING (Orthopedic Implant) ×12 IMPLANT
CARTRIDGE OIL MAESTRO DRILL (MISCELLANEOUS) ×2 IMPLANT
CNTNR URN SCR LID CUP LEK RST (MISCELLANEOUS) ×2 IMPLANT
CONT SPEC 4OZ STRL OR WHT (MISCELLANEOUS) ×3
COVER BACK TABLE 60X90IN (DRAPES) ×3 IMPLANT
DECANTER SPIKE VIAL GLASS SM (MISCELLANEOUS) ×3 IMPLANT
DERMABOND ADVANCED (GAUZE/BANDAGES/DRESSINGS) ×1
DERMABOND ADVANCED .7 DNX12 (GAUZE/BANDAGES/DRESSINGS) ×2 IMPLANT
DIFFUSER DRILL AIR PNEUMATIC (MISCELLANEOUS) ×3 IMPLANT
DRAPE C-ARM 42X72 X-RAY (DRAPES) ×6 IMPLANT
DRAPE HALF SHEET 40X57 (DRAPES) IMPLANT
DRAPE LAPAROTOMY 100X72X124 (DRAPES) ×3 IMPLANT
DRAPE SURG 17X23 STRL (DRAPES) ×12 IMPLANT
DRSG OPSITE POSTOP 4X6 (GAUZE/BANDAGES/DRESSINGS) ×3 IMPLANT
DURAPREP 26ML APPLICATOR (WOUND CARE) ×3 IMPLANT
ELECT REM PT RETURN 9FT ADLT (ELECTROSURGICAL) ×3
ELECTRODE REM PT RTRN 9FT ADLT (ELECTROSURGICAL) ×2 IMPLANT
EVACUATOR 1/8 PVC DRAIN (DRAIN) IMPLANT
GAUZE 4X4 16PLY ~~LOC~~+RFID DBL (SPONGE) IMPLANT
GAUZE SPONGE 4X4 12PLY STRL (GAUZE/BANDAGES/DRESSINGS) IMPLANT
GLOVE EXAM NITRILE XL STR (GLOVE) IMPLANT
GLOVE SURG ENC MOIS LTX SZ6.5 (GLOVE) ×3 IMPLANT
GLOVE SURG LTX SZ7 (GLOVE) ×1 IMPLANT
GLOVE SURG LTX SZ7.5 (GLOVE) ×4 IMPLANT
GLOVE SURG LTX SZ9 (GLOVE) ×6 IMPLANT
GLOVE SURG UNDER POLY LF SZ6.5 (GLOVE) ×3 IMPLANT
GLOVE SURG UNDER POLY LF SZ7.5 (GLOVE) ×4 IMPLANT
GOWN STRL REUS W/ TWL LRG LVL3 (GOWN DISPOSABLE) ×1 IMPLANT
GOWN STRL REUS W/ TWL XL LVL3 (GOWN DISPOSABLE) ×4 IMPLANT
GOWN STRL REUS W/TWL 2XL LVL3 (GOWN DISPOSABLE) IMPLANT
GOWN STRL REUS W/TWL LRG LVL3 (GOWN DISPOSABLE) ×3
GOWN STRL REUS W/TWL XL LVL3 (GOWN DISPOSABLE) ×6
KIT BASIN OR (CUSTOM PROCEDURE TRAY) ×3 IMPLANT
KIT TURNOVER KIT B (KITS) ×3 IMPLANT
MILL MEDIUM DISP (BLADE) ×3 IMPLANT
NEEDLE HYPO 22GX1.5 SAFETY (NEEDLE) ×3 IMPLANT
NS IRRIG 1000ML POUR BTL (IV SOLUTION) ×3 IMPLANT
OIL CARTRIDGE MAESTRO DRILL (MISCELLANEOUS) ×3
PACK LAMINECTOMY NEURO (CUSTOM PROCEDURE TRAY) ×3 IMPLANT
ROD RADIUS 40MM (Neuro Prosthesis/Implant) ×6 IMPLANT
ROD SPNL 40X5.5XNS TI RDS (Neuro Prosthesis/Implant) IMPLANT
SCREW 5.75X45MM (Screw) ×4 IMPLANT
SPACER PL CATALYFT LONG 11 (Spacer) ×2 IMPLANT
SPONGE SURGIFOAM ABS GEL 100 (HEMOSTASIS) ×3 IMPLANT
STRIP CLOSURE SKIN 1/2X4 (GAUZE/BANDAGES/DRESSINGS) ×4 IMPLANT
SUT VIC AB 0 CT1 18XCR BRD8 (SUTURE) ×4 IMPLANT
SUT VIC AB 0 CT1 8-18 (SUTURE) ×6
SUT VIC AB 2-0 CT1 18 (SUTURE) ×3 IMPLANT
SUT VIC AB 3-0 SH 8-18 (SUTURE) ×6 IMPLANT
TOWEL GREEN STERILE (TOWEL DISPOSABLE) ×3 IMPLANT
TOWEL GREEN STERILE FF (TOWEL DISPOSABLE) ×3 IMPLANT
TRAY FOLEY MTR SLVR 16FR STAT (SET/KITS/TRAYS/PACK) ×3 IMPLANT
WATER STERILE IRR 1000ML POUR (IV SOLUTION) ×3 IMPLANT

## 2020-11-02 NOTE — Transfer of Care (Signed)
Immediate Anesthesia Transfer of Care Note  Patient: [name]  Procedure(s) Performed: Posterior Lumbar Interbody Fusion Lumbar five-Sacral one (Back) APPLICATION OF CELL SAVER  Patient Location: PACU  Anesthesia Type:General  Level of Consciousness: drowsy and patient cooperative  Airway & Oxygen Therapy: Patient Spontanous Breathing and Patient connected to nasal cannula oxygen  Post-op Assessment: Report given to RN and Post -op Vital signs reviewed and stable  Post vital signs: Reviewed and stable  Last Vitals:  Vitals Value Taken Time  BP 152/100 11/02/20 1327  Temp    Pulse 94 11/02/20 1330  Resp 23 11/02/20 1330  SpO2 99 % 11/02/20 1330  Vitals shown include unvalidated device data.  Last Pain:  Vitals:   11/02/20 0821  TempSrc: Oral         Complications: No notable events documented.

## 2020-11-02 NOTE — Anesthesia Procedure Notes (Signed)
Procedure Name: Intubation Date/Time: 11/02/2020 10:40 AM Performed by: Moshe Salisbury, CRNA Pre-anesthesia Checklist: Patient identified, Emergency Drugs available, Suction available and Patient being monitored Patient Re-evaluated:Patient Re-evaluated prior to induction Oxygen Delivery Method: Circle System Utilized Preoxygenation: Pre-oxygenation with 100% oxygen Induction Type: IV induction Ventilation: Mask ventilation without difficulty Laryngoscope Size: Mac and 4 Grade View: Grade I Tube type: Oral Tube size: 8.0 mm Number of attempts: 1 Airway Equipment and Method: Stylet Placement Confirmation: ETT inserted through vocal cords under direct vision, positive ETCO2 and breath sounds checked- equal and bilateral Secured at: 24 cm Tube secured with: Tape Dental Injury: Teeth and Oropharynx as per pre-operative assessment

## 2020-11-02 NOTE — Op Note (Signed)
Date of procedure: 11/02/2020  Date of dictation: Same  Service: Neurosurgery  Preoperative diagnosis: L5-S1 degenerative retrolisthesis with severe stenosis and radiculopathy  Postoperative diagnosis: Same  Procedure Name: Bilateral L5-S1 decompressive laminotomies and foraminotomies, more than would be required for simple interbody fusion alone.  L5-S1 posterior lumbar interbody fusion utilizing interbody cages, locally harvested autograft, morselized allograft  L5-S1 posterior lateral arthrodesis utilizing nonsegmental pedicle screw fixation and local autograft  Surgeon:Dottie Vaquerano A.Coreen Shippee, M.D.  Asst. Surgeon: Kathyrn Sheriff, MD; Reinaldo Meeker, NP  Anesthesia: General  Indication: 60 year old male with severe back and bilateral lower extremity symptoms right greater than left which is failed conservative manage.  Work-up demonstrates evidence of significant disc generation with degenerative retrolisthesis of L5 on S1 with severe central and lateral recess and foraminal stenosis bilaterally.  Patient presents now for decompression and fusion in hopes of improving his symptoms.  Operative note: After induction of anesthesia, patient position prone on the Wilson frame appropriate padded.  Lumbar region prepped and draped sterilely.  Incision made overlying L5-S1.  Dissection performed bilaterally.  Retractor placed.  Fluoroscopy used.  Levels confirmed.  Decompressive laminotomies and facetectomies then performed bilaterally using [place] rongeurs care centers and high-speed drill to remove the inferior two thirds of the lamina of L5 the entire inferior facet and pars interarticularis of L5 bilaterally.  In the majority the superior facet of S1 and the superior lamina was also removed.  Ligament flavum elevated and resected.  Foraminotomies completed along course the exiting L5 and S1 nerve roots bilaterally.  Bilateral discectomies were then performed.  The space was then prepared for interbody fusion.  With a  distractor placed the patient's right side to space was further cleaned of soft tissue.  An 11 mm [employer] expandable cage was then impacted in place and expanded.  Distractor removed patient's right side.  To space prepared on the right side.  Morselized autograft packed in the interspace.  Second cage was then impacted in place and expanded.  Each cage was then filled with allograft fibers using graft fibers.  Pedicles of L5 and S1 were identified using surface landmarks and intraoperative fluoroscopy with suture bone around the pedicle was then removed using high-speed drill.  Pedicle was then probed using pedicle all each pedicle all track was probed and found to be solidly within the bone.  Each pedicle tract was then tapped with a screw tap.  Screw double was probed and once again found to be solidly within the bone.  5.75 mm radius brand screws from [employer] placed bilaterally at L5 and S1.  Final images reveal good position of the cages and the hardware at the proper upper level with normal alignment of the spine.  Short segment titanium rod was then placed with screws at L5 and S1.  Locking caps placed at the screws and locking caps and engaged with a construct under compression.  Transverse processes and sacral ala were then decorticated.  Morselized autograft was packed posterior laterally.  Vancomycin powder was placed in the deep wound space in layers of Vicryl sutures.  Steri-Strips and sterile dressing were applied.  No apparent complications.  Patient tolerated the procedure well and he returns to the recovery room postop.

## 2020-11-02 NOTE — H&P (Signed)
[name] is an 60 y.o. male.   Chief Complaint: Back pain HPI: 60 year old male with chronic and progressively worsening back and bilateral lower extremity pain.  Symptoms of failed all conservative management.  Work-up demonstrates evidence of degenerative spondylolisthesis with significant stenosis at L5S1.  The patient presents now for lumbar decompression and fusion in hopes of improving his symptoms.  Past Medical History:  Diagnosis Date   Asthma    CAD (coronary artery disease)    a. 09/2013 NSTEMI/PCI: LM nl, LAD 40-50%, D1 100 (2.25 x 28 Vision BMS), LCX min irregs, RI 60-70, 30, RCA 40-50/50-56m/p, EF 45-50%.  b. cath 12/2014 -occulded BMS in diag, 50% pro to mid LAD, 60% ramus, 30% RCA    Chest pain 08/2015   Chronic diastolic CHF (congestive heart failure) ([place]    a. 09/2014 EF 45-50% by LV gram;  b. 01/2014 Echo: EF 55-60%, Gr 1 DD.   Cocaine abuse ([facility]    COPD (chronic obstructive pulmonary disease) ([facility]    DVT (deep venous thrombosis) ([facility]    "I had ~ 10 in each leg" (12/20/2016)   Essential hypertension    Hepatitis C    "clear free over a year now"   High cholesterol    Myocardial infarction ([facility] ~ 2014   Osteoarthritis    a. hands and toes.   Pneumonia    "several times" (12/20/2016)   Pulmonary embolism ([facility]    a. 2012 - s/p IVC filter;  b. prev on eliquis - noncompliant.   Shortness of breath dyspnea    Sinus headache    Squamous cell skin cancer 07/13/2016   "foot"   Tobacco abuse     Past Surgical History:  Procedure Laterality Date   CARDIAC CATHETERIZATION N/A 01/04/2015   Procedure: Left Heart Cath and Coronary Angiography;  Surgeon: CBurnell Blanks MD;  [facility];  Service: Cardiovascular;  Laterality: N/A;   CORONARY ANGIOPLASTY WITH STENT PLACEMENT  10/21/13   BMS to D1   CYST EXCISION N/A 05/10/2016   Procedure: EXCISION OF SEBACEOUS CYST UPPER BACK;  Surgeon: MDonnie Mesa MD;  [place] [facility]  Service:  General;  Laterality: N/A;   FOOT SURGERY Right ~ 06/2016   "said there was skin cancer on it"   LEFT HEART CATHETERIZATION WITH CORONARY ANGIOGRAM N/A 10/21/2013   Procedure: LEFT HEART CATHETERIZATION WITH CORONARY ANGIOGRAM;  Surgeon: DLeonie Man MD;  [facility];  Service: Cardiovascular;  Laterality: N/A;   VENA CAVA FILTER PLACEMENT  ~ 2007-~ 2017   "1 in my neck; 2 in my wrist"    Family History  Problem Relation Age of Onset   Asthma Mother    Allergies Mother    Allergies Sister    Allergies Brother    Deep vein thrombosis Brother        two brothers with recurrent DVT   Heart attack Father        a.60s b. deceased in his 715s  Coronary artery disease Father    Colon cancer Neg Hx    Liver cancer Neg Hx    Esophageal cancer Neg Hx    Colon polyps Neg Hx    Rectal cancer Neg Hx    Pancreatic cancer Neg Hx    Stomach cancer Neg Hx    Social History:  reports that he has been smoking cigarettes and cigars. He has a 9.75 pack-year smoking history. He has never used smokeless tobacco. He reports  current alcohol use of about 6.0 standard drinks of alcohol per week. He reports previous drug use. Drug: Cocaine.  Allergies:  Allergies  Allergen Reactions   Atorvastatin     myalgia    Medications Prior to Admission  Medication Sig Dispense Refill   albuterol (PROAIR HFA) 108 (90 Base) MCG/ACT inhaler Inhale 2 puffs into the lungs every 6 (six) hours as needed for wheezing or shortness of breath. 18 g 6   cetirizine (ZYRTEC) 10 MG tablet Take 1 tablet (10 mg total) by mouth daily. 30 tablet 3   Evolocumab 140 MG/ML SOAJ INJECT 140 MG INTO THE SKIN EVERY 14 (FOURTEEN) DAYS. 2 mL 11   ezetimibe (ZETIA) 10 MG tablet TAKE 1 TABLET (10 MG TOTAL) BY MOUTH DAILY. 90 tablet 3   fluticasone (FLONASE) 50 MCG/ACT nasal spray PLACE 2 SPRAYS INTO BOTH NOSTRILS 2 (TWO) TIMES DAILY. 16 g 2   Fluticasone-Umeclidin-Vilant 100-62.5-25 MCG/INH AEPB INHALE 1 INHALER INTO THE LUNGS  DAILY. 60 each 6   furosemide (LASIX) 40 MG tablet Take 1 tablet (40 mg total) by mouth daily as needed (May take '40mg'$  Daily for Swelling and Shortness of breath). 30 tablet 6   gabapentin (NEURONTIN) 300 MG capsule Take 1 capsule (300 mg total) by mouth 2 (two) times daily. 60 capsule 6   HYDROcodone-acetaminophen (NORCO/VICODIN) 5-325 MG tablet Take 1-2 tablets by mouth every 6 (six) hours as needed. 10 tablet 0   isosorbide mononitrate (IMDUR) 30 MG 24 hr tablet Take 1 tablet (30 mg total) by mouth daily. 30 tablet 3   metoprolol succinate (TOPROL-XL) 25 MG 24 hr tablet Take 1 tablet (25 mg total) by mouth daily. 90 tablet 1   pantoprazole (PROTONIX) 40 MG tablet TAKE 1 TABLET (40 MG TOTAL) BY MOUTH DAILY. 30 tablet 6   SYMBICORT 160-4.5 MCG/ACT inhaler Inhale 2 puffs into the lungs 2 (two) times daily.     tiZANidine (ZANAFLEX) 4 MG tablet TAKE 1 TABLET (4 MG TOTAL) BY MOUTH EVERY 8 (EIGHT) HOURS AS NEEDED FOR MUSCLE SPASMS. 60 tablet 2   vitamin B-12 (CYANOCOBALAMIN) 1000 MCG tablet Take 1 tablet (1,000 mcg total) by mouth daily. 30 tablet 5   albuterol (PROVENTIL) (2.5 MG/3ML) 0.083% nebulizer solution Take 3 mLs (2.5 mg total) by nebulization every 6 (six) hours as needed for wheezing or shortness of breath. 75 mL 3   apixaban (ELIQUIS) 5 MG TABS tablet TAKE 1 TABLET (5 MG TOTAL) BY MOUTH 2 (TWO) TIMES DAILY. 60 tablet 6   budesonide-formoterol (SYMBICORT) 160-4.5 MCG/ACT inhaler INHALE 2 PUFFS INTO THE LUNGS 2 (TWO) TIMES DAILY. 10.2 g 6   nitroGLYCERIN (NITROSTAT) 0.4 MG SL tablet Place 1 tablet (0.4 mg total) under the tongue every 5 (five) minutes as needed for chest pain. 25 tablet 3   predniSONE (DELTASONE) 10 MG tablet 6 TABS FOR 1 DAY, THEN 5 TABS FOR 1 DAY, THEN 4 TABS FOR 1 DAY, THEN 3 TABS FOR 1 DAY, 2 TABS FOR 1 DAY, THEN 1 TAB FOR 1 DAY 21 tablet 0    Results for orders placed or performed during the hospital encounter of 11/02/20 (from the past 48 hour(s))  SARS Coronavirus 2  by RT PCR (hospital order, performed in [facility] hospital lab) Nasopharyngeal Nasopharyngeal Swab     Status: None   Collection Time: 11/02/20  8:20 AM   Specimen: Nasopharyngeal Swab  Result Value Ref Range   SARS Coronavirus 2 NEGATIVE NEGATIVE    Comment: (NOTE) SARS-CoV-2 target nucleic  acids are NOT DETECTED.  The SARS-CoV-2 RNA is generally detectable in upper and lower respiratory specimens during the acute phase of infection. The lowest concentration of SARS-CoV-2 viral copies this assay can detect is 250 copies / mL. A negative result does not preclude SARS-CoV-2 infection and should not be used as the sole basis for treatment or other patient management decisions.  A negative result may occur with improper specimen collection / handling, submission of specimen other than nasopharyngeal swab, presence of viral mutation(s) within the areas targeted by this assay, and inadequate number of viral copies (<250 copies / mL). A negative result must be combined with clinical observations, patient history, and epidemiological information.  Fact Sheet for Patients:   StrictlyIdeas.no  Fact Sheet for Healthcare Providers: BankingDealers.co.za  This test is not yet approved or  cleared by the [place] FDA and has been authorized for detection and/or diagnosis of SARS-CoV-2 by FDA under an Emergency Use Authorization (EUA).  This EUA will remain in effect (meaning this test can be used) for the duration of the COVID-19 declaration under Section 564(b)(1) of the Act, 21 U.S.C. section 360bbb-3(b)(1), unless the authorization is terminated or revoked sooner.  Performed at [facility], [place] [address] [place], [place] [address]   CBC WITH DIFFERENTIAL     Status: Abnormal   Collection Time: 11/02/20  8:38 AM  Result Value Ref Range   WBC 6.2 4.0 - 10.5 K/uL   RBC 4.33 4.22 - 5.81 MIL/uL   Hemoglobin 13.4 13.0 - 17.0 g/dL    HCT 41.2 39.0 - 52.0 %   MCV 95.2 80.0 - 100.0 fL   MCH 30.9 26.0 - 34.0 pg   MCHC 32.5 30.0 - 36.0 g/dL   RDW 13.7 11.5 - 15.5 %   Platelets 176 150 - 400 K/uL    Comment: REPEATED TO VERIFY   nRBC 0.0 0.0 - 0.2 %   Neutrophils Relative % 50 %   Neutro Abs 3.1 1.7 - 7.7 K/uL   Lymphocytes Relative 26 %   Lymphs Abs 1.6 0.7 - 4.0 K/uL   Monocytes Relative 9 %   Monocytes Absolute 0.5 0.1 - 1.0 K/uL   Eosinophils Relative 13 %   Eosinophils Absolute 0.8 (H) 0.0 - 0.5 K/uL   Basophils Relative 1 %   Basophils Absolute 0.0 0.0 - 0.1 K/uL   Immature Granulocytes 1 %   Abs Immature Granulocytes 0.04 0.00 - 0.07 K/uL    Comment: Performed at [place] [address], [place], [place] [address]   No results found.  Pertinent items noted in HPI and remainder of comprehensive ROS otherwise negative.  Blood pressure (!) 150/105, pulse (!) 102, temperature 98.7 F (37.1 C), temperature source Oral, resp. rate 18, height '6\' 2"'$  (1.88 m), weight 99.8 kg, SpO2 100 %.  Patient is awake and alert.  He is oriented and appropriate.  Speech is fluent.  Judgment insight are intact.  Cranial nerve function normal bilateral.  Motor examination reveals intact motor strength.  Sensory examination with decrease sensation pinprick light touch in his L5 and S1 dermatomes bilaterally.  Straight raising is positive bilaterally.  Deep cervix is normal active Supples Achilles reflexes are absent bilaterally.  Gait is antalgic.  Posture is mildly flexed peer examination head ears eyes nose throat is unremarked.  Chest and abdomen are benign.  Extremities are free from injury deformity. Assessment/Plan L5-S1 degenerative spondylolisthesis with stenosis and chronic back and radicular pain.  Plan  bilateral L5S1 decompressive laminotomies foraminotomies followed by posterior lumbar body fusion utilizing interbody cages, Kerrison autograft, and augmented with posterior lateral arthrodesis utilizing  nonsegmental pedicle screw fixation and local autograft.  Risks and benefits of been explained.  Patient wishes to proceed.  Dent Plantz A Ty Oshima 11/02/2020, 10:02 AM

## 2020-11-02 NOTE — Brief Op Note (Signed)
11/02/2020  1:17 PM  PATIENT:  [name]  60 y.o. male  PRE-OPERATIVE DIAGNOSIS:  Stenosis  POST-OPERATIVE DIAGNOSIS:  Stenosis  PROCEDURE:  Procedure(s): Posterior Lumbar Interbody Fusion Lumbar five-Sacral one (N/A) APPLICATION OF CELL SAVER  SURGEON:  Surgeon(s) and Role:    * [name] Larsson, MD - Primary    * [name] Lose, MD - Assisting  PHYSICIAN ASSISTANT:   ASSISTANTSMearl [name]   ANESTHESIA:   general  EBL:  200 cc   BLOOD ADMINISTERED:none  DRAINS: none   LOCAL MEDICATIONS USED:  MARCAINE     SPECIMEN:  No Specimen  DISPOSITION OF SPECIMEN:  N/A  COUNTS:  YES  TOURNIQUET:  * No tourniquets in log *  DICTATION: .Dragon Dictation  PLAN OF CARE: Admit for overnight observation  PATIENT DISPOSITION:  PACU - hemodynamically stable.   Delay start of Pharmacological VTE agent (>24hrs) due to surgical blood loss or risk of bleeding: yes

## 2020-11-02 NOTE — Progress Notes (Signed)
Orthopedic Tech Progress Note Patient Details:  [name] [birth year] [id number]  Ortho Devices Type of Ortho Device: Lumbar corsett Ortho Device/Splint Location: BACK Ortho Device/Splint Interventions: Ordered, Adjustment   Post Interventions Patient Tolerated: Well Instructions Provided: Care of [place] 11/02/2020, 2:42 PM

## 2020-11-02 NOTE — Anesthesia Postprocedure Evaluation (Signed)
Anesthesia Post Note  Patient: [name]  Procedure(s) Performed: Posterior Lumbar Interbody Fusion Lumbar five-Sacral one (Back) APPLICATION OF CELL SAVER     Patient location during evaluation: PACU Anesthesia Type: General Level of consciousness: awake and alert and oriented Pain management: pain level controlled Vital Signs Assessment: post-procedure vital signs reviewed and stable Respiratory status: spontaneous breathing, nonlabored ventilation, respiratory function stable and patient connected to nasal cannula oxygen Cardiovascular status: blood pressure returned to baseline and stable Postop Assessment: no apparent nausea or vomiting Anesthetic complications: no   No notable events documented.  Last Vitals:  Vitals:   11/02/20 1343 11/02/20 1358  BP: (!) 134/96 132/89  Pulse: 85 78  Resp: 12 11  Temp:    SpO2: 99% 99%    Last Pain:  Vitals:   11/02/20 1358  TempSrc:   PainSc: Asleep                 Rennee Coyne A.

## 2020-11-03 ENCOUNTER — Other Ambulatory Visit: Payer: Self-pay

## 2020-11-03 MED ORDER — METHOCARBAMOL 500 MG PO TABS
500.0000 mg | ORAL_TABLET | Freq: Four times a day (QID) | ORAL | 1 refills | Status: DC
  Filled 2020-11-03: qty 40, 10d supply, fill #0

## 2020-11-03 MED ORDER — OXYCODONE-ACETAMINOPHEN 10-325 MG PO TABS: 1.0000 | ORAL_TABLET | ORAL | 0 refills | Status: DC | PRN

## 2020-11-03 MED ORDER — OXYCODONE HCL 10 MG PO TABS
10.0000 mg | ORAL_TABLET | ORAL | 0 refills | Status: DC | PRN
  Filled 2020-11-03: qty 40, 5d supply, fill #0

## 2020-11-03 MED FILL — Sodium Chloride IV Soln 0.9%: INTRAVENOUS | Qty: 1000 | Status: AC

## 2020-11-03 MED FILL — Heparin Sodium (Porcine) Inj 1000 Unit/ML: INTRAMUSCULAR | Qty: 30 | Status: AC

## 2020-11-03 NOTE — Discharge Instructions (Signed)
Wound Care Keep incision covered and dry for two days.  If you shower, cover incision with plastic wrap.  Do not put any creams, lotions, or ointments on incision. Leave steri-strips on back.  They will fall off by themselves. Activity Walk each and every day, increasing distance each day. No lifting greater than 5 lbs.  Avoid excessive neck motion. No driving for 2 weeks; may ride as a passenger locally. If provided with back brace, wear when out of bed.  It is not necessary to wear brace in bed. Diet Resume your normal diet.  Return to Work Will be discussed at you follow up appointment. Call Your Doctor If Any of These Occur Redness, drainage, or swelling at the wound.  Temperature greater than 101 degrees. Severe pain not relieved by pain medication. Incision starts to come apart. Follow Up Appt Call today for appointment in 1-2 weeks ([phone]) or for problems.  If you have any hardware placed in your spine, you will need an x-ray before your appointment.   Resume Eliquis on Saturday 

## 2020-11-03 NOTE — Evaluation (Signed)
Occupational Therapy Evaluation Patient Details Name: [name] MRN: [id number] DOB: [birth year] Today's Date: 11/03/2020    History of Present Illness 60 y.o. male with Back pain.  Underwent PLIF L5-S1.  PMH includes:Asthma, CAD, CHF, Cocaine abuse, COPD, Essential hypertension, Hepatitis C, High cholesterol, Myocardial infarction.   Clinical Impression   Patient admitted for the diagnosis and procedure above.  PTA he admits lower extremity weakness, and occasional assist with sit to stand from low surfaces.  He lives in an apartment, with PRN support from his sister that lives in the same complex.  Barriers are listed below.  Currently he is close to his baseline for ADL completion and in room mobility.  Patient educated on the use of hip kit, but unsure he will be able to purchase.  Patient looking to discharge today, no OT follow up recommended given family support.  Back precautions reviewed, and all questions answered.      Follow Up Recommendations  No OT follow up    Equipment Recommendations  None recommended by OT    Recommendations for Other Services       Precautions / Restrictions Precautions Precautions: Back Precaution Booklet Issued: Yes (comment) Precaution Comments: Reviewed and patient verbalized understanding. Required Braces or Orthoses: Spinal Brace Spinal Brace: Lumbar corset;Applied in sitting position Restrictions Weight Bearing Restrictions: No      Mobility Bed Mobility Overal bed mobility: Modified Independent             General bed mobility comments: log roll and use of SR's Patient Response: Cooperative  Transfers Overall transfer level: Modified independent                    Balance Overall balance assessment: Mild deficits observed, not formally tested                                         ADL either performed or assessed with clinical judgement   ADL Overall ADL's : At baseline                                        General ADL Comments: Increased time and effort, but able to complete ADL from sit/stand level.     Vision Baseline Vision/History: Wears glasses Wears Glasses: At all times Patient Visual Report: No change from baseline       Perception     Praxis      Pertinent Vitals/Pain Pain Assessment: Faces Faces Pain Scale: Hurts little more Pain Location: surgical site Pain Descriptors / Indicators: Operative site guarding Pain Intervention(s): Monitored during session     Hand Dominance Right   Extremity/Trunk Assessment Upper Extremity Assessment Upper Extremity Assessment: Overall WFL for tasks assessed   Lower Extremity Assessment Lower Extremity Assessment: Defer to PT evaluation   Cervical / Trunk Assessment Cervical / Trunk Assessment: Other exceptions Cervical / Trunk Exceptions: spine surgery   Communication Communication Communication: No difficulties   Cognition Arousal/Alertness: Awake/alert Behavior During Therapy: WFL for tasks assessed/performed Overall Cognitive Status: Within Functional Limits for tasks assessed                                      Home Living Family/patient expects to  be discharged to:: Private residence Living Arrangements: Alone Available Help at Discharge: Family;Available PRN/intermittently Type of Home: Apartment Home Access: Elevator     Home Layout: One level     Bathroom Shower/Tub: Teacher, early years/pre: Handicapped height Bathroom Accessibility: Yes How Accessible: Accessible via walker Home Equipment: Grab bars - toilet;Grab bars - tub/shower;Hand held shower head          Prior Functioning/Environment Level of Independence: Independent        Comments: Occasional assist with sit to stand, SOB and LB weakness.        OT Problem List: Pain;Decreased strength      OT Treatment/Interventions:      OT Goals(Current goals can be found in  the care plan section) Acute Rehab OT Goals Patient Stated Goal: Return home and recover OT Goal Formulation: With patient Time For Goal Achievement: 11/03/20 Potential to Achieve Goals: Good  OT Frequency:   None noted  Barriers to D/C:            Co-evaluation              AM-PAC OT "6 Clicks" Daily Activity     Outcome Measure Help from another person eating meals?: None Help from another person taking care of personal grooming?: None Help from another person toileting, which includes using toliet, bedpan, or urinal?: None Help from another person bathing (including washing, rinsing, drying)?: None Help from another person to put on and taking off regular upper body clothing?: None Help from another person to put on and taking off regular lower body clothing?: None 6 Click Score: 24   End of Session Equipment Utilized During Treatment: Back brace Nurse Communication: Mobility status  Activity Tolerance: Patient tolerated treatment well Patient left: in bed;with call bell/phone within reach  OT Visit Diagnosis: Pain;Muscle weakness (generalized) (M62.81)                Time: 9957-9009 OT Time Calculation (min): 23 min Charges:  OT General Charges $OT Visit: 1 Visit OT Evaluation $OT Eval Moderate Complexity: 1 Mod OT Treatments $Self Care/Home Management : 8-22 mins  11/03/2020  Rich, OTR/L  Acute Rehabilitation Services  Office:  [phone]   Metta Clines 11/03/2020, 9:25 AM

## 2020-11-03 NOTE — Discharge Summary (Signed)
Physician Discharge Summary  Patient ID: SAMAAD ORSBURN MRN: [id number] DOB/AGE: 06-17-1960 60 y.o.  Admit date: 11/02/2020 Discharge date: 11/03/2020  Admission Diagnoses:  Discharge Diagnoses:  Active Problems:   Degenerative spondylolisthesis   Discharged Condition: good  Hospital Course: Patient admitted to hospital where underwent uncomplicated lumbar decompression and fusion.  Postoperatively doing well.  Preoperative severe back and bilateral knee pain significantly improved.  Standing ambulating and voiding without difficulty.  Ready for discharge home.  Consults:   Significant Diagnostic Studies:   Treatments:   Discharge Exam: Blood pressure 115/81, pulse 78, temperature 98.2 F (36.8 C), temperature source Oral, resp. rate 18, height '6\' 2"'$  (1.88 m), weight 99.8 kg, SpO2 96 %. Awake and alert.  Oriented.  Motor and sensory function intact.  Wound clean and dry.  Chest and abdomen.  Disposition: Discharge disposition: 01-Home or Self Care        Allergies as of 11/03/2020       Reactions   Atorvastatin    myalgia        Medication List     STOP taking these medications    HYDROcodone-acetaminophen 5-325 MG tablet Commonly known as: NORCO/VICODIN       TAKE these medications    albuterol (2.5 MG/3ML) 0.083% nebulizer solution Commonly known as: PROVENTIL Take 3 mLs (2.5 mg total) by nebulization every 6 (six) hours as needed for wheezing or shortness of breath.   albuterol 108 (90 Base) MCG/ACT inhaler Commonly known as: ProAir HFA Inhale 2 puffs into the lungs every 6 (six) hours as needed for wheezing or shortness of breath.   Eliquis 5 MG Tabs tablet Generic drug: apixaban TAKE 1 TABLET (5 MG TOTAL) BY MOUTH 2 (TWO) TIMES DAILY.   furosemide 40 MG tablet Commonly known as: LASIX Take 1 tablet (40 mg total) by mouth daily as needed (May take '40mg'$  Daily for Swelling and Shortness of breath).   gabapentin 300 MG capsule Commonly known  as: NEURONTIN Take 1 capsule (300 mg total) by mouth 2 (two) times daily.   isosorbide mononitrate 30 MG 24 hr tablet Commonly known as: IMDUR Take 1 tablet (30 mg total) by mouth daily.   methocarbamol 500 MG tablet Commonly known as: Robaxin Take 1 tablet (500 mg total) by mouth 4 (four) times daily.   metoprolol succinate 25 MG 24 hr tablet Commonly known as: TOPROL-XL Take 1 tablet (25 mg total) by mouth daily.   nitroGLYCERIN 0.4 MG SL tablet Commonly known as: NITROSTAT Place 1 tablet (0.4 mg total) under the tongue every 5 (five) minutes as needed for chest pain.   Oxycodone HCl 10 MG Tabs Take 1 tablet (10 mg total) by mouth every 3 (three) hours as needed for severe pain ((score 7 to 10)).   Symbicort 160-4.5 MCG/ACT inhaler Generic drug: budesonide-formoterol INHALE 2 PUFFS INTO THE LUNGS 2 (TWO) TIMES DAILY.   vitamin B-12 1000 MCG tablet Commonly known as: CYANOCOBALAMIN Take 1 tablet (1,000 mcg total) by mouth daily.       ASK your doctor about these medications    cetirizine 10 MG tablet Commonly known as: ZYRTEC Take 1 tablet (10 mg total) by mouth daily.   ezetimibe 10 MG tablet Commonly known as: ZETIA TAKE 1 TABLET (10 MG TOTAL) BY MOUTH DAILY.   fluticasone 50 MCG/ACT nasal spray Commonly known as: FLONASE PLACE 2 SPRAYS INTO BOTH NOSTRILS 2 (TWO) TIMES DAILY.   pantoprazole 40 MG tablet Commonly known as: PROTONIX TAKE 1 TABLET (40 MG  TOTAL) BY MOUTH DAILY.   Repatha SureClick [id number] MG/ML Soaj Generic drug: Evolocumab INJECT 140 MG INTO THE SKIN EVERY 14 (FOURTEEN) DAYS.   tiZANidine 4 MG tablet Commonly known as: ZANAFLEX TAKE 1 TABLET (4 MG TOTAL) BY MOUTH EVERY 8 (EIGHT) HOURS AS NEEDED FOR MUSCLE SPASMS.   Trelegy Ellipta 100-62.5-25 MCG/INH Aepb Generic drug: Fluticasone-Umeclidin-Vilant INHALE 1 INHALER INTO THE LUNGS DAILY.               Durable Medical Equipment  (From admission, onward)           Start      Ordered   11/02/20 1647  DME 3 n 1  Once        11/02/20 1646   11/02/20 1647  DME Walker rolling  Once       Question:  Patient needs a walker to treat with the following condition  Answer:  Degenerative spondylolisthesis   11/02/20 1646             Signed: Mallie Mussel A Rudie Rikard 11/03/2020, 9:09 AM

## 2020-11-03 NOTE — Progress Notes (Signed)
Patient was transported via wheelchair by volunteer for discharge home; in no acute distress nor complaints of pain nor discomfort; room was checked and accounted for all his belongings; discharge instructions given to patient by RN and he verbalized understanding on the instructions given.

## 2020-11-03 NOTE — Evaluation (Signed)
Physical Therapy Evaluation Patient Details Name: [name] MRN: [id number] DOB: 1960/12/19 Today's Date: 11/03/2020   History of Present Illness  60 y.o. male with Back pain.  Underwent PLIF L5-S1.  PMH includes:Asthma, CAD, CHF, Cocaine abuse, COPD, Essential hypertension, Hepatitis C, High cholesterol, Myocardial infarction.  Clinical Impression  Pt admitted with above diagnosis. At the time of PT eval, pt was able to demonstrate transfers and ambulation with gross min guard assist and RW for support. Safety, reported pain, gait speed, and gait deviations improved with RW however pt declines needing it at home. Pt was educated on precautions, brace application/wearing schedule, appropriate activity progression, and car transfer. Pt currently with functional limitations due to the deficits listed below (see PT Problem List). Pt will benefit from skilled PT to increase their independence and safety with mobility to allow discharge to the venue listed below.      Follow Up Recommendations Home health PT    Equipment Recommendations  Rolling walker with 5" wheels    Recommendations for Other Services       Precautions / Restrictions Precautions Precautions: Back Precaution Booklet Issued: Yes (comment) Precaution Comments: Reviewed and patient verbalized understanding. Required Braces or Orthoses: Spinal Brace Spinal Brace: Lumbar corset;Applied in sitting position Restrictions Weight Bearing Restrictions: No      Mobility  Bed Mobility Overal bed mobility: Needs Assistance Bed Mobility: Rolling;Sit to Sidelying Rolling: Modified independent (Device/Increase time)       Sit to sidelying: Min guard General bed mobility comments: Increased effort and time to elevate LE's back up into bed at end of session. VC's for optimal log roll technique.    Transfers Overall transfer level: Needs assistance Equipment used: None Transfers: Sit to/from Stand Sit to Stand: Min  guard;From elevated surface         General transfer comment: Close guard for safety. Pt asks for bed height to be progressively raised throughout session. Difficulty powering up to full stand. VC's for wide BOS and to get feet under him more to increase ease of transfer.  Ambulation/Gait Ambulation/Gait assistance: Min guard;Supervision Gait Distance (Feet): 300 Feet Assistive device: None;Rolling walker (2 wheeled) Gait Pattern/deviations: Step-through pattern;Decreased stride length;Trunk flexed Gait velocity: Decreased Gait velocity interpretation: <1.31 ft/sec, indicative of household ambulator General Gait Details: VC's for improved posture, closer walker proximity, and forward gaze. Pt initially without AD and struggling to advance feet without reaching out for the wall. With RW, pt was able to improve posture, quality of gait pattern, and gait speed.  Stairs            Wheelchair Mobility    Modified Rankin (Stroke Patients Only)       Balance Overall balance assessment: Needs assistance Sitting-balance support: Feet supported;No upper extremity supported Sitting balance-Leahy Scale: Fair     Standing balance support: No upper extremity supported Standing balance-Leahy Scale: Poor Standing balance comment: Reaching out for external support                             Pertinent Vitals/Pain Pain Assessment: Faces Faces Pain Scale: Hurts little more Pain Location: surgical site Pain Descriptors / Indicators: Operative site guarding Pain Intervention(s): Monitored during session;Limited activity within patient's tolerance;Repositioned    Home Living Family/patient expects to be discharged to:: Private residence Living Arrangements: Alone Available Help at Discharge: Family;Available PRN/intermittently Type of Home: Apartment Home Access: Elevator     Home Layout: One level Home Equipment: Grab  bars - toilet;Grab bars - tub/shower;Hand held  shower head      Prior Function Level of Independence: Independent         Comments: Occasional assist with sit to stand, SOB and LB weakness.     Hand Dominance   Dominant Hand: Right    Extremity/Trunk Assessment   Upper Extremity Assessment Upper Extremity Assessment: Defer to OT evaluation    Lower Extremity Assessment Lower Extremity Assessment: Generalized weakness    Cervical / Trunk Assessment Cervical / Trunk Assessment: Other exceptions Cervical / Trunk Exceptions: s/p surgery  Communication   Communication: No difficulties  Cognition Arousal/Alertness: Awake/alert Behavior During Therapy: WFL for tasks assessed/performed Overall Cognitive Status: Within Functional Limits for tasks assessed                                        General Comments      Exercises     Assessment/Plan    PT Assessment Patient needs continued PT services  PT Problem List Decreased strength;Decreased activity tolerance;Decreased balance;Decreased mobility;Decreased knowledge of use of DME;Decreased safety awareness;Decreased knowledge of precautions;Pain       PT Treatment Interventions DME instruction;Gait training;Functional mobility training;Therapeutic activities;Therapeutic exercise;Neuromuscular re-education;Patient/family education    PT Goals (Current goals can be found in the Care Plan section)  Acute Rehab PT Goals Patient Stated Goal: Return home and recover PT Goal Formulation: With patient Time For Goal Achievement: 11/10/20 Potential to Achieve Goals: Good    Frequency Min 5X/week   Barriers to discharge        Co-evaluation               AM-PAC PT "6 Clicks" Mobility  Outcome Measure Help needed turning from your back to your side while in a flat bed without using bedrails?: None Help needed moving from lying on your back to sitting on the side of a flat bed without using bedrails?: A Little Help needed moving to and from a  bed to a chair (including a wheelchair)?: A Little Help needed standing up from a chair using your arms (e.g., wheelchair or bedside chair)?: A Little Help needed to walk in hospital room?: A Little Help needed climbing 3-5 steps with a railing? : A Little 6 Click Score: 19    End of Session Equipment Utilized During Treatment: Gait belt Activity Tolerance: Patient limited by fatigue Patient left: in bed;with call bell/phone within reach Nurse Communication: Mobility status PT Visit Diagnosis: Unsteadiness on feet (R26.81);Pain Pain - part of body:  (back)    Time: [id number] PT Time Calculation (min) (ACUTE ONLY): 26 min   Charges:   PT Evaluation $PT Eval Low Complexity: 1 Low PT Treatments $Gait Training: 8-22 mins        Rolinda Roan, PT, DPT Acute Rehabilitation Services Pager: [phone] Office: [phone]   Thelma Comp 11/03/2020, 3:34 PM

## 2020-11-04 ENCOUNTER — Other Ambulatory Visit: Payer: Self-pay

## 2020-11-04 ENCOUNTER — Telehealth: Payer: Self-pay

## 2020-11-04 NOTE — Telephone Encounter (Signed)
Transition Care Management Follow-up Telephone Call   Date of discharge and from where:[facility] 11/03/2020 How have you been since you were released from the hospital? OK Any questions or concerns? No questions/concerns reported.  Items Reviewed:  Did the pt receive and understand the discharge instructions provided? have the instructions and have no questions.  Medications obtained and verified? He said that they have the medication list  and the hospital staff reviewed them in detail prior to discharge. He said that he has all of the medications and they have no questions.  Any new allergies since your discharge? None reported  Do you have support at home? Yes, spouse Other (ie: DME, Home Health, etc)     Bathroom Shower/Tub: Tub/shower unit Handicapped height,  walker, Grab bars - toilet;Grab bars - tub/shower;Hand held shower head   Functional Questionnaire: (I = Independent and D = Dependent) ADL's:  Independent.        Follow up appointments reviewed:   PCP Hospital f/u appt confirmed? Dr Margarita Rana on 12/01/2020@ 1050.  [facility] f/u appt confirmed? scheduled at this time  Are transportation arrangements needed? have transportation   If their condition worsens, is the pt aware to call  their PCP or go to the ED? Yes.Made pt aware if condition worsen or start experiencing rapid weight gain, chest pain, diff breathing, SOB, high fevers, or bleading to refer imediately to ED for further evaluation.  Was the patient provided with contact information for the PCP's office or ED? He has the phone number  Was the pt encouraged to call back with questions or concerns?yes

## 2020-11-26 ENCOUNTER — Other Ambulatory Visit: Payer: Self-pay

## 2020-11-26 MED FILL — Evolocumab Subcutaneous Soln Auto-Injector 140 MG/ML: SUBCUTANEOUS | 28 days supply | Qty: 2 | Fill #2 | Status: CN

## 2020-12-01 ENCOUNTER — Other Ambulatory Visit: Payer: Self-pay

## 2020-12-01 ENCOUNTER — Ambulatory Visit: Payer: Medicare Other | Attending: Family Medicine | Admitting: Family Medicine

## 2020-12-01 ENCOUNTER — Encounter: Payer: Self-pay | Admitting: Family Medicine

## 2020-12-01 VITALS — BP 150/98 | HR 92 | Resp 20 | Ht 74.0 in | Wt 220.0 lb

## 2020-12-01 DIAGNOSIS — M48061 Spinal stenosis, lumbar region without neurogenic claudication: Secondary | ICD-10-CM

## 2020-12-01 DIAGNOSIS — M5416 Radiculopathy, lumbar region: Secondary | ICD-10-CM | POA: Diagnosis not present

## 2020-12-01 DIAGNOSIS — M25471 Effusion, right ankle: Secondary | ICD-10-CM | POA: Diagnosis not present

## 2020-12-01 DIAGNOSIS — Z23 Encounter for immunization: Secondary | ICD-10-CM

## 2020-12-01 DIAGNOSIS — M25474 Effusion, right foot: Secondary | ICD-10-CM

## 2020-12-01 DIAGNOSIS — M25571 Pain in right ankle and joints of right foot: Secondary | ICD-10-CM | POA: Diagnosis not present

## 2020-12-01 DIAGNOSIS — M25572 Pain in left ankle and joints of left foot: Secondary | ICD-10-CM

## 2020-12-01 DIAGNOSIS — M25472 Effusion, left ankle: Secondary | ICD-10-CM | POA: Diagnosis not present

## 2020-12-01 DIAGNOSIS — M25475 Effusion, left foot: Secondary | ICD-10-CM | POA: Diagnosis not present

## 2020-12-01 DIAGNOSIS — I10 Essential (primary) hypertension: Secondary | ICD-10-CM | POA: Diagnosis not present

## 2020-12-01 DIAGNOSIS — R21 Rash and other nonspecific skin eruption: Secondary | ICD-10-CM | POA: Diagnosis not present

## 2020-12-01 MED ORDER — METHOCARBAMOL 500 MG PO TABS
500.0000 mg | ORAL_TABLET | Freq: Three times a day (TID) | ORAL | 1 refills | Status: DC | PRN
  Filled 2020-12-01: qty 90, 30d supply, fill #0

## 2020-12-01 MED ORDER — PERMETHRIN 5 % EX CREA
1.0000 | TOPICAL_CREAM | Freq: Once | CUTANEOUS | 1 refills | Status: AC
Start: 2020-12-01 — End: 2020-12-02
  Filled 2020-12-01: qty 60, 3d supply, fill #0

## 2020-12-01 MED ORDER — TRIAMCINOLONE ACETONIDE 0.1 % EX CREA
1.0000 "application " | TOPICAL_CREAM | Freq: Two times a day (BID) | CUTANEOUS | 1 refills | Status: DC
  Filled 2020-12-01: qty 60, 30d supply, fill #0

## 2020-12-01 NOTE — Progress Notes (Signed)
Need refill on muscle relaxer and cholesterol injection  Rash on left arm and BLE  Ankle pain

## 2020-12-01 NOTE — Patient Instructions (Signed)
Edema  Edema is when you have too much fluid in your body or under your skin. Edema may make your legs, feet, and ankles swell up. Swelling is also common in looser tissues, like around your eyes. This is a common condition. It gets more common as you get older. There are many possible causes of edema. Eating too much salt (sodium) and being on your feet or sitting for a long time can cause edema in yourlegs, feet, and ankles. Hot weather may make edema worse. Edema is usually painless. Your skin may look swollen or shiny. Follow these instructions at home: Keep the swollen body part raised (elevated) above the level of your heart when you are sitting or lying down. Do not sit still or stand for a long time. Do not wear tight clothes. Do not wear garters on your upper legs. Exercise your legs. This can help the swelling go down. Wear elastic bandages or support stockings as told by your doctor. Eat a low-salt (low-sodium) diet to reduce fluid as told by your doctor. Depending on the cause of your swelling, you may need to limit how much fluid you drink (fluid restriction). Take over-the-counter and prescription medicines only as told by your doctor. Contact a doctor if: Treatment is not working. You have heart, liver, or kidney disease and have symptoms of edema. You have sudden and unexplained weight gain. Get help right away if: You have shortness of breath or chest pain. You cannot breathe when you lie down. You have pain, redness, or warmth in the swollen areas. You have heart, liver, or kidney disease and get edema all of a sudden. You have a fever and your symptoms get worse all of a sudden. Summary Edema is when you have too much fluid in your body or under your skin. Edema may make your legs, feet, and ankles swell up. Swelling is also common in looser tissues, like around your eyes. Raise (elevate) the swollen body part above the level of your heart when you are sitting or lying  down. Follow your doctor's instructions about diet and how much fluid you can drink (fluid restriction). This information is not intended to replace advice given to you by your health care provider. Make sure you discuss any questions you have with your healthcare provider. Document Revised: 01/07/2020 Document Reviewed: 01/07/2020 Elsevier Patient Education  2022 Elsevier Inc.  

## 2020-12-01 NOTE — Progress Notes (Signed)
Subjective:  Patient ID: [name], male    DOB: 1960-08-26  Age: 60 y.o. MRN: [id number]  CC: Medication Refill and Rash   HPI [name] is a 60 y.o. year old male with a history of COPD, chronic sinusitis, recurrent DVT and PE (on anticoagulation with Eliquis), coronary artery disease (status post stent, last cardiac cath from 12/2014 revealed two-vessel CAD, medical management recommended),  (EF 40-45% from 06/2019),tobacco abuse, status post posterior lumbar interbody fusion in 10/2020.  Interval History: Since his back surgery he has been doing well and is currently on oxycodone which he received from his neurosurgeon Dr. Trenton Gammon.  He is wearing a back brace. He still has difficulty rising from a seated position.  A couple of days ago he developed a rash which is pruritic and erythematous and is worse at night.  Denies introduction of new medication and has no known allergies to foods.  He informs me his place of residence was cleaned out 1 year ago so he does not think he has bedbugs. His ankle is painful and edematous but not erythematous. He does have some dyspnea on exertion which is chronic.  Ankle symptoms have been present for the last couple of days as well.  Past Medical History:  Diagnosis Date   Asthma    CAD (coronary artery disease)    a. 09/2013 NSTEMI/PCI: LM nl, LAD 40-50%, D1 100 (2.25 x 28 Vision BMS), LCX min irregs, RI 60-70, 30, RCA 40-50/50-61m/p, EF 45-50%.  b. cath 12/2014 -occulded BMS in diag, 50% pro to mid LAD, 60% ramus, 30% RCA    Chest pain 08/2015   Chronic diastolic CHF (congestive heart failure) ([facility]    a. 09/2014 EF 45-50% by LV gram;  b. 01/2014 Echo: EF 55-60%, Gr 1 DD.   Cocaine abuse ([facility]    COPD (chronic obstructive pulmonary disease) ([facility]    DV[place]deep venous thrombosis) ([facility]    "I had ~ 10 in each leg" (12/20/2016)   Essential hypertension    Hepatitis C    "clear free over a year now"   High cholesterol    Myocardial  infarction ([facility] ~ 2014   Osteoarthritis    a. hands and toes.   Pneumonia    "several times" (12/20/2016)   Pulmonary embolism ([facility]    a. 2012 - s/p IVC filter;  b. prev on eliquis - noncompliant.   Shortness of breath dyspnea    Sinus headache    Squamous cell skin cancer 07/13/2016   "foot"   Tobacco abuse     Past Surgical History:  Procedure Laterality Date   CARDIAC CATHETERIZATION N/A 01/04/2015   Procedure: Left Heart Cath and Coronary Angiography;  Surgeon: CBurnell Blanks MD;  [facility];  Service: Cardiovascular;  Laterality: N/A;   CORONARY ANGIOPLASTY WITH STENT PLACEMENT  10/21/13   BMS to D1   CYST EXCISION N/A 05/10/2016   Procedure: EXCISION OF SEBACEOUS CYST UPPER BACK;  Surgeon: MDonnie Mesa MD;  [facility]  Service: General;  Laterality: N/A;   FOOT SURGERY Right ~ 06/2016   "said there was skin cancer on it"   LEFT HEART CATHETERIZATION WITH CORONARY ANGIOGRAM N/A 10/21/2013   Procedure: LEFT HEART CATHETERIZATION WITH CORONARY ANGIOGRAM;  Surgeon: DLeonie Man MD;  [facility];  Service: Cardiovascular;  Laterality: N/A;   VENA CAVA FILTER PLACEMENT  ~ 2007-~ 2017   "1 in my neck; 2 in my wrist"  Family History  Problem Relation Age of Onset   Asthma Mother    Allergies Mother    Allergies Sister    Allergies Brother    Deep vein thrombosis Brother        two brothers with recurrent DVT   Heart attack Father        a.60s b. deceased in his 13s   Coronary artery disease Father    Colon cancer Neg Hx    Liver cancer Neg Hx    Esophageal cancer Neg Hx    Colon polyps Neg Hx    Rectal cancer Neg Hx    Pancreatic cancer Neg Hx    Stomach cancer Neg Hx     Allergies  Allergen Reactions   Atorvastatin     myalgia    Outpatient Medications Prior to Visit  Medication Sig Dispense Refill   albuterol (PROAIR HFA) 108 (90 Base) MCG/ACT inhaler Inhale 2 puffs into the lungs every 6 (six) hours as needed  for wheezing or shortness of breath. 18 g 6   albuterol (PROVENTIL) (2.5 MG/3ML) 0.083% nebulizer solution Take 3 mLs (2.5 mg total) by nebulization every 6 (six) hours as needed for wheezing or shortness of breath. 75 mL 3   apixaban (ELIQUIS) 5 MG TABS tablet TAKE 1 TABLET (5 MG TOTAL) BY MOUTH 2 (TWO) TIMES DAILY. 60 tablet 6   cetirizine (ZYRTEC) 10 MG tablet Take 1 tablet (10 mg total) by mouth daily. (Patient taking differently: Take 10 mg by mouth daily as needed for allergies.) 30 tablet 3   Evolocumab 140 MG/ML SOAJ INJECT 140 MG INTO THE SKIN EVERY 14 (FOURTEEN) DAYS. (Patient taking differently: Inject 140 mg into the skin every 14 (fourteen) days.) 2 mL 11   ezetimibe (ZETIA) 10 MG tablet TAKE 1 TABLET (10 MG TOTAL) BY MOUTH DAILY. (Patient taking differently: Take 10 mg by mouth daily.) 90 tablet 3   fluticasone (FLONASE) 50 MCG/ACT nasal spray PLACE 2 SPRAYS INTO BOTH NOSTRILS 2 (TWO) TIMES DAILY. (Patient taking differently: Place 2 sprays into both nostrils daily as needed for allergies.) 16 g 2   Fluticasone-Umeclidin-Vilant 100-62.5-25 MCG/INH AEPB INHALE 1 INHALER INTO THE LUNGS DAILY. (Patient taking differently: Inhale 1 puff into the lungs daily.) 60 each 6   furosemide (LASIX) 40 MG tablet Take 1 tablet (40 mg total) by mouth daily as needed (May take '40mg'$  Daily for Swelling and Shortness of breath). 30 tablet 6   gabapentin (NEURONTIN) 300 MG capsule Take 1 capsule (300 mg total) by mouth 2 (two) times daily. 60 capsule 6   isosorbide mononitrate (IMDUR) 30 MG 24 hr tablet Take 1 tablet (30 mg total) by mouth daily. 30 tablet 3   metoprolol succinate (TOPROL-XL) 25 MG 24 hr tablet Take 1 tablet (25 mg total) by mouth daily. 90 tablet 1   nitroGLYCERIN (NITROSTAT) 0.4 MG SL tablet Place 1 tablet (0.4 mg total) under the tongue every 5 (five) minutes as needed for chest pain. 25 tablet 3   oxyCODONE-acetaminophen (PERCOCET) 10-325 MG tablet Take 1 tablet by mouth every 4 (four)  hours as needed for pain. 30 tablet 0   pantoprazole (PROTONIX) 40 MG tablet TAKE 1 TABLET (40 MG TOTAL) BY MOUTH DAILY. (Patient taking differently: Take 40 mg by mouth daily.) 30 tablet 6   vitamin B-12 (CYANOCOBALAMIN) 1000 MCG tablet Take 1 tablet (1,000 mcg total) by mouth daily. 30 tablet 5   methocarbamol (ROBAXIN) 500 MG tablet Take 1 tablet (500 mg total) by  mouth 4 (four) times daily. 40 tablet 1   tiZANidine (ZANAFLEX) 4 MG tablet TAKE 1 TABLET (4 MG TOTAL) BY MOUTH EVERY 8 (EIGHT) HOURS AS NEEDED FOR MUSCLE SPASMS. (Patient taking differently: Take 4 mg by mouth every 8 (eight) hours as needed for muscle spasms.) 60 tablet 2   budesonide-formoterol (SYMBICORT) 160-4.5 MCG/ACT inhaler INHALE 2 PUFFS INTO THE LUNGS 2 (TWO) TIMES DAILY. 10.2 g 6   No facility-administered medications prior to visit.     ROS Review of Systems  Constitutional:  Negative for activity change and appetite change.  HENT:  Negative for sinus pressure and sore throat.   Eyes:  Negative for visual disturbance.  Respiratory:  Negative for cough, chest tightness and shortness of breath.   Cardiovascular:  Positive for leg swelling. Negative for chest pain.  Gastrointestinal:  Negative for abdominal distention, abdominal pain, constipation and diarrhea.  Endocrine: Negative.   Genitourinary:  Negative for dysuria.  Musculoskeletal:  Negative for myalgias.       See HPI  Skin:  Positive for rash.  Allergic/Immunologic: Negative.   Neurological:  Negative for weakness, light-headedness and numbness.  Psychiatric/Behavioral:  Negative for dysphoric mood and suicidal ideas.    Objective:  BP (!) 150/98 (BP Location: Left Arm, Patient Position: Sitting, Cuff Size: Large)   Pulse 92   Resp 20   Ht '6\' 2"'$  (1.88 m)   Wt 220 lb (99.8 kg)   SpO2 100%   BMI 28.25 kg/m   BP/Weight 12/01/2020 [id number]  Systolic BP [id number] -  Diastolic BP 98 81 -  Wt. (Lbs) 220 - 220  BMI 28.25 - 28.25       Physical Exam Constitutional:      Appearance: He is well-developed.  Cardiovascular:     Rate and Rhythm: Normal rate.     Heart sounds: Normal heart sounds. No murmur heard. Pulmonary:     Effort: Pulmonary effort is normal.     Breath sounds: Normal breath sounds. No wheezing or rales.  Chest:     Chest wall: No tenderness.  Abdominal:     General: Bowel sounds are normal. There is no distension.     Palpations: Abdomen is soft. There is no mass.     Tenderness: There is no abdominal tenderness.  Musculoskeletal:        General: Tenderness (lumbar spine) present.     Right lower leg: Edema (1+) present.     Left lower leg: Edema (1+) present.     Comments: Tenderness on palpation of bilateral ankles and legs  Skin:    Comments: Erythematous rash which is raised diffusely distributed on bilateral upper and lower extremities, anterior and posterior trunk  Neurological:     Mental Status: He is alert and oriented to person, place, and time.  Psychiatric:        Mood and Affect: Mood normal.    CMP Latest Ref Rng & Units 11/02/2020 08/13/2020 05/04/2020  Glucose 70 - 99 mg/dL 114(H) - -  BUN 6 - 20 mg/dL 10 - -  Creatinine 0.61 - 1.24 mg/dL 0.76 - -  Sodium 135 - 145 mmol/L 136 - -  Potassium 3.5 - 5.1 mmol/L 3.5 - -  Chloride 98 - 111 mmol/L 106 - -  CO2 22 - 32 mmol/L 21(L) - -  Calcium 8.9 - 10.3 mg/dL 9.1 - -  Total Protein 6.0 - 8.5 g/dL - 7.8 7.0  Total Bilirubin 0.0 - 1.2 mg/dL - <0.2 0.5  Alkaline Phos 44 - 121 IU/L - 122(H) 122(H)  AST 0 - 40 IU/L - 18 22  ALT 0 - 44 IU/L - 25 19    Lipid Panel     Component Value Date/Time   CHOL 168 08/13/2020 1105   TRIG 104 08/13/2020 1105   HDL 69 08/13/2020 1105   CHOLHDL 2.4 08/13/2020 1105   CHOLHDL 3.1 01/01/2015 0208   VLDL 12 01/01/2015 0208   LDLCALC 80 08/13/2020 1105    CBC    Component Value Date/Time   WBC 6.2 11/02/2020 0838   RBC 4.33 11/02/2020 0838   HGB 13.4 11/02/2020 0838   HGB 13.0  06/09/2019 0926   HCT 41.2 11/02/2020 0838   HCT 38.4 06/09/2019 0926   PLT 176 11/02/2020 0838   PLT 212 06/09/2019 0926   MCV 95.2 11/02/2020 0838   MCV 93 06/09/2019 0926   MCH 30.9 11/02/2020 0838   MCHC 32.5 11/02/2020 0838   RDW 13.7 11/02/2020 0838   RDW 13.0 06/09/2019 0926   LYMPHSABS 1.6 11/02/2020 0838   LYMPHSABS 1.4 08/03/2016 1244   MONOABS 0.5 11/02/2020 0838   EOSABS 0.8 (H) 11/02/2020 0838   EOSABS 0.3 08/03/2016 1244   BASOSABS 0.0 11/02/2020 0838   BASOSABS 0.0 08/03/2016 1244    Lab Results  Component Value Date   HGBA1C 5.9 (H) 08/09/2020    Assessment & Plan:  1. Rash and nonspecific skin eruption Unknown etiology History points towards scabies as etiology however patient denies this Will place on topical permethrin and also provide triamcinolone - CBC with Differential/Platelet - permethrin (ELIMITE) 5 % cream; Apply 1 application topically once for 1 dose. May repeat in 72 hours.  Dispense: 60 g; Refill: 1 - triamcinolone cream (KENALOG) 0.1 %; Apply 1 application topically 2 (two) times daily.  Dispense: 80 g; Refill: 1  2. Bilateral swelling of feet and ankles Probably from dependent edema He is already on torsemide 40 mg Advised to use compression stockings but given his recent back surgery I am unsure he will be able to put this on - Pro b natriuretic peptide  3. Spinal stenosis of lumbar region with radiculopathy S/p lumbar interbody fusion Followed by Neurosurgery  4. Essential hypertension Uncontrolled Elevation likely secondary to pain No regimen change today Counseled on blood pressure goal of less than 130/80, low-sodium, DASH diet, medication compliance, 150 minutes of moderate intensity exercise per week. Discussed medication compliance, adverse effects. He is currently on a PCSK9 inhibitor and has been advised to obtain this from his cardiologist  5. Acute bilateral ankle pain Possibly due to combination of pedal edema and  underlying osteoarthritis Low suspicion for gout Will place on Robaxin - methocarbamol (ROBAXIN) 500 MG tablet; Take 1 tablet (500 mg total) by mouth every 8 (eight) hours as needed for muscle spasms.  Dispense: 90 tablet; Refill: 1  6. Need for influenza vaccination - Flu Vaccine QUAD 6+ mos PF IM (Fluarix Quad PF)    Meds ordered this encounter  Medications   methocarbamol (ROBAXIN) 500 MG tablet    Sig: Take 1 tablet (500 mg total) by mouth every 8 (eight) hours as needed for muscle spasms.    Dispense:  90 tablet    Refill:  1   permethrin (ELIMITE) 5 % cream    Sig: Apply 1 application topically once for 1 dose. May repeat in 72 hours.    Dispense:  60 g    Refill:  1   triamcinolone cream (KENALOG)  0.1 %    Sig: Apply 1 application topically 2 (two) times daily.    Dispense:  80 g    Refill:  1    Follow-up: Return in about 3 months (around 03/02/2021), or if symptoms worsen or fail to improve, for Medical conditions.       Charlott Rakes, MD, FAAFP. [facility] [place] [place]   12/01/2020, 12:12 PM

## 2020-12-02 LAB — CBC WITH DIFFERENTIAL/PLATELET
Basophils Absolute: 0 10*3/uL (ref 0.0–0.2)
Basos: 1 %
EOS (ABSOLUTE): 0.3 10*3/uL (ref 0.0–0.4)
Eos: 6 %
Hematocrit: 39.3 % (ref 37.5–51.0)
Hemoglobin: 13.5 g/dL (ref 13.0–17.7)
Immature Grans (Abs): 0 10*3/uL (ref 0.0–0.1)
Immature Granulocytes: 1 %
Lymphocytes Absolute: 1.2 10*3/uL (ref 0.7–3.1)
Lymphs: 28 %
MCH: 30.7 pg (ref 26.6–33.0)
MCHC: 34.4 g/dL (ref 31.5–35.7)
MCV: 89 fL (ref 79–97)
Monocytes Absolute: 0.5 10*3/uL (ref 0.1–0.9)
Monocytes: 12 %
Neutrophils Absolute: 2.3 10*3/uL (ref 1.4–7.0)
Neutrophils: 52 %
Platelets: 174 10*3/uL (ref 150–450)
RBC: 4.4 x10E6/uL (ref 4.14–5.80)
RDW: 12.9 % (ref 11.6–15.4)
WBC: 4.4 10*3/uL (ref 3.4–10.8)

## 2020-12-02 LAB — PRO B NATRIURETIC PEPTIDE: NT-Pro BNP: 308 pg/mL — ABNORMAL HIGH (ref 0–210)

## 2020-12-03 ENCOUNTER — Telehealth: Payer: Self-pay

## 2020-12-03 NOTE — Telephone Encounter (Signed)
Patient name and DOB has been verified Patient was informed of lab results. Patient had no questions.  

## 2020-12-03 NOTE — Telephone Encounter (Signed)
-----   Message from Charlott Rakes, MD sent at 12/02/2020  4:19 PM EDT ----- Please inform him that his blood count is negative for infection. If he continue to have swelling in his legs please advise to take an extra Furosemide pill for the next 5 days then return to usual dose

## 2020-12-10 ENCOUNTER — Telehealth: Payer: Self-pay | Admitting: Internal Medicine

## 2020-12-10 ENCOUNTER — Other Ambulatory Visit: Payer: Self-pay

## 2020-12-10 DIAGNOSIS — E785 Hyperlipidemia, unspecified: Secondary | ICD-10-CM

## 2020-12-10 DIAGNOSIS — I2510 Atherosclerotic heart disease of native coronary artery without angina pectoris: Secondary | ICD-10-CM

## 2020-12-10 MED ORDER — EVOLOCUMAB 140 MG/ML ~~LOC~~ SOAJ
SUBCUTANEOUS | 11 refills | Status: DC
  Filled 2020-12-10: qty 2, fill #0
  Filled 2020-12-30: qty 2, 28d supply, fill #0
  Filled 2020-12-30: qty 6, 84d supply, fill #1

## 2020-12-10 NOTE — Telephone Encounter (Signed)
Pt is calling in regards to getting [place]. He states he is using his last one today.Please advise pt further

## 2020-12-15 ENCOUNTER — Encounter: Payer: Self-pay | Admitting: Podiatry

## 2020-12-15 ENCOUNTER — Other Ambulatory Visit: Payer: Self-pay

## 2020-12-15 ENCOUNTER — Ambulatory Visit (INDEPENDENT_AMBULATORY_CARE_PROVIDER_SITE_OTHER): Payer: Medicare Other | Admitting: Podiatry

## 2020-12-15 DIAGNOSIS — B351 Tinea unguium: Secondary | ICD-10-CM

## 2020-12-15 DIAGNOSIS — Z7901 Long term (current) use of anticoagulants: Secondary | ICD-10-CM

## 2020-12-15 DIAGNOSIS — M79674 Pain in right toe(s): Secondary | ICD-10-CM

## 2020-12-15 DIAGNOSIS — M79675 Pain in left toe(s): Secondary | ICD-10-CM

## 2020-12-15 DIAGNOSIS — M48062 Spinal stenosis, lumbar region with neurogenic claudication: Secondary | ICD-10-CM | POA: Diagnosis not present

## 2020-12-15 NOTE — Progress Notes (Signed)
This patient returns to my office for at risk foot care.  This patient requires this care by a professional since this patient will be at risk due to having coagulation defect. Patient is taking eliquis.  This patient is unable to cut nails himself since the patient cannot reach his nails.These nails are painful walking and wearing shoes.  This patient presents for at risk foot care today.  General Appearance  Alert, conversant and in no acute stress.  Vascular  Dorsalis pedis and posterior tibial  pulses are palpable  bilaterally.  Capillary return is within normal limits  bilaterally. Temperature is within normal limits  bilaterally.  Neurologic  Senn-Weinstein monofilament wire test within normal limits  bilaterally. Muscle power within normal limits bilaterally.  Nails Thick disfigured discolored nails with subungual debris  from hallux to fifth toes bilaterally. No evidence of bacterial infection or drainage bilaterally.  Orthopedic  No limitations of motion  feet .  No crepitus or effusions noted.  No bony pathology or digital deformities noted.  Skin  normotropic skin with no porokeratosis noted bilaterally.  No signs of infections or ulcers noted.     Onychomycosis  Pain in right toes  Pain in left toes  Consent was obtained for treatment procedures.   Mechanical debridement of nails 1-5  bilaterally performed with a nail nipper.  Filed with dremel without incident.    Return office visit    3    months                  Told patient to return for periodic foot care and evaluation due to potential at risk complications.   Yoan Sallade DPM  

## 2020-12-30 ENCOUNTER — Other Ambulatory Visit: Payer: Self-pay

## 2020-12-30 ENCOUNTER — Encounter: Payer: Self-pay | Admitting: Family Medicine

## 2020-12-30 ENCOUNTER — Other Ambulatory Visit: Payer: Self-pay | Admitting: Internal Medicine

## 2020-12-30 ENCOUNTER — Ambulatory Visit: Payer: Medicare Other | Attending: Family Medicine | Admitting: Family Medicine

## 2020-12-30 ENCOUNTER — Telehealth: Payer: Self-pay | Admitting: Internal Medicine

## 2020-12-30 VITALS — BP 142/88 | HR 72 | Ht 74.0 in | Wt 215.4 lb

## 2020-12-30 DIAGNOSIS — I2510 Atherosclerotic heart disease of native coronary artery without angina pectoris: Secondary | ICD-10-CM

## 2020-12-30 DIAGNOSIS — M5416 Radiculopathy, lumbar region: Secondary | ICD-10-CM

## 2020-12-30 DIAGNOSIS — M48061 Spinal stenosis, lumbar region without neurogenic claudication: Secondary | ICD-10-CM | POA: Diagnosis not present

## 2020-12-30 DIAGNOSIS — E785 Hyperlipidemia, unspecified: Secondary | ICD-10-CM

## 2020-12-30 DIAGNOSIS — R0902 Hypoxemia: Secondary | ICD-10-CM | POA: Diagnosis not present

## 2020-12-30 DIAGNOSIS — R29898 Other symptoms and signs involving the musculoskeletal system: Secondary | ICD-10-CM

## 2020-12-30 DIAGNOSIS — Z87891 Personal history of nicotine dependence: Secondary | ICD-10-CM | POA: Diagnosis not present

## 2020-12-30 MED ORDER — EVOLOCUMAB 140 MG/ML ~~LOC~~ SOAJ
SUBCUTANEOUS | 3 refills | Status: DC
  Filled 2020-12-30: qty 6, fill #0

## 2020-12-30 MED ORDER — PREDNISONE 20 MG PO TABS
20.0000 mg | ORAL_TABLET | Freq: Every day | ORAL | 0 refills | Status: DC
  Filled 2020-12-30: qty 5, 5d supply, fill #0

## 2020-12-30 NOTE — Telephone Encounter (Signed)
*  STAT* If patient is at the pharmacy, call can be transferred to refill team.   1. Which medications need to be refilled? (please list name of each medication and dose if known) Repatha Sureclick 614 mg E31V  2. Which pharmacy/location (including street and city if local pharmacy) is medication to be sent to? [facility] [place]  3. Do they need a 30 day or 90 day supply? 90   Patient states that he was told by the pharmacy to ask for a 3 month supply.

## 2020-12-30 NOTE — Telephone Encounter (Signed)
Refill sent in as 3 month supply.

## 2020-12-30 NOTE — Progress Notes (Signed)
Subjective:  Patient ID: [name], male    DOB: Jul 30, 1960  Age: 60 y.o. MRN: [id number]  CC: Leg Pain   HPI [name] is a 60 y.o. year old male with a history of COPD, chronic sinusitis, recurrent DVT and PE (on anticoagulation with Eliquis), coronary artery disease (status post stent, last cardiac cath from 12/2014 revealed two-vessel CAD, medical management recommended),  (EF 40-45% from 06/2019),tobacco abuse, status post posterior lumbar interbody fusion in 10/2020.    Interval History: What bothers him is he has a difficulty rising up out of a seated position as his legs are weak. If the chair is high he can get up but if it ls a low chair someone has to help him. Walking a short distance causes his legs to be weak. R is weaker than left.  Symptoms have not improved since his surgery. He requests a prescription for prednisone of symptoms have improved after prescription for prednisone. Has an upcoming appointment with [facility] on 03/10/21  He also endorses presence of dyspnea after walking 50 yds.  Oxygen walk test performed today and he desaturated to 89% on room air on exertion. He has a >30 pack year history; Currently smokes 4 -10 cig/day Echocardiogram scheduled by cardiology in 07/2020 which he is yet to undergo. Past Medical History:  Diagnosis Date   Asthma    CAD (coronary artery disease)    a. 09/2013 NSTEMI/PCI: LM nl, LAD 40-50%, D1 100 (2.25 x 28 Vision BMS), LCX min irregs, RI 60-70, 30, RCA 40-50/50-28ms/p, EF 45-50%.  b. cath 12/2014 -occulded BMS in diag, 50% pro to mid LAD, 60% ramus, 30% RCA    Chest pain 08/2015   Chronic diastolic CHF (congestive heart failure) ([place])    a. 09/2014 EF 45-50% by LV gram;  b. 01/2014 Echo: EF 55-60%, Gr 1 DD.   Cocaine abuse ([place])    COPD (chronic obstructive pulmonary disease) ([place])    DVT (deep venous thrombosis) ([place])    "I had ~ 10 in each leg" (12/20/2016)   Essential hypertension    Hepatitis C    "clear free  over a year now"   High cholesterol    Myocardial infarction [facility]) ~ 2014   Osteoarthritis    a. hands and toes.   Pneumonia    "several times" (12/20/2016)   Pulmonary embolism ([place])    a. 2012 - s/p IVC filter;  b. prev on eliquis - noncompliant.   Shortness of breath dyspnea    Sinus headache    Squamous cell skin cancer 07/13/2016   "foot"   Tobacco abuse     Past Surgical History:  Procedure Laterality Date   CARDIAC CATHETERIZATION N/A 01/04/2015   Procedure: Left Heart Cath and Coronary Angiography;  Surgeon: Burnell Blanks, MD;  Location: [facility];  Service: Cardiovascular;  Laterality: N/A;   CORONARY ANGIOPLASTY WITH STENT PLACEMENT  10/21/13   BMS to D1   CYST EXCISION N/A 05/10/2016   Procedure: EXCISION OF SEBACEOUS CYST UPPER BACK;  Surgeon: Donnie Mesa, MD;  Location: [facility];  Service: General;  Laterality: N/A;   FOOT SURGERY Right ~ 06/2016   "said there was skin cancer on it"   LEFT HEART CATHETERIZATION WITH CORONARY ANGIOGRAM N/A 10/21/2013   Procedure: LEFT HEART CATHETERIZATION WITH CORONARY ANGIOGRAM;  Surgeon: Leonie Man, MD;  Location: [facility];  Service: Cardiovascular;  Laterality: N/A;   VENA CAVA FILTER PLACEMENT  ~ 2007-~ 2017   "  1 in my neck; 2 in my wrist"    Family History  Problem Relation Age of Onset   Asthma Mother    Allergies Mother    Allergies Sister    Allergies Brother    Deep vein thrombosis Brother        two brothers with recurrent DVT   Heart attack Father        a.60s b. deceased in his 59s   Coronary artery disease Father    Colon cancer Neg Hx    Liver cancer Neg Hx    Esophageal cancer Neg Hx    Colon polyps Neg Hx    Rectal cancer Neg Hx    Pancreatic cancer Neg Hx    Stomach cancer Neg Hx     Allergies  Allergen Reactions   Atorvastatin     myalgia    Outpatient Medications Prior to Visit  Medication Sig Dispense Refill   albuterol (PROAIR HFA) 108 (90 Base) MCG/ACT inhaler Inhale 2  puffs into the lungs every 6 (six) hours as needed for wheezing or shortness of breath. 18 g 6   albuterol (PROVENTIL) (2.5 MG/3ML) 0.083% nebulizer solution Take 3 mLs (2.5 mg total) by nebulization every 6 (six) hours as needed for wheezing or shortness of breath. 75 mL 3   apixaban (ELIQUIS) 5 MG TABS tablet TAKE 1 TABLET (5 MG TOTAL) BY MOUTH 2 (TWO) TIMES DAILY. 60 tablet 6   cetirizine (ZYRTEC) 10 MG tablet Take 1 tablet (10 mg total) by mouth daily. (Patient taking differently: Take 10 mg by mouth daily as needed for allergies.) 30 tablet 3   Evolocumab 140 MG/ML SOAJ INJECT 140 MG INTO THE SKIN EVERY 14 (FOURTEEN) DAYS. 2 mL 11   ezetimibe (ZETIA) 10 MG tablet TAKE 1 TABLET (10 MG TOTAL) BY MOUTH DAILY. (Patient taking differently: Take 10 mg by mouth daily.) 90 tablet 3   fluticasone (FLONASE) 50 MCG/ACT nasal spray PLACE 2 SPRAYS INTO BOTH NOSTRILS 2 (TWO) TIMES DAILY. (Patient taking differently: Place 2 sprays into both nostrils daily as needed for allergies.) 16 g 2   Fluticasone-Umeclidin-Vilant 100-62.5-25 MCG/INH AEPB INHALE 1 INHALER INTO THE LUNGS DAILY. (Patient taking differently: Inhale 1 puff into the lungs daily.) 60 each 6   furosemide (LASIX) 40 MG tablet Take 1 tablet (40 mg total) by mouth daily as needed (May take 40mg  Daily for Swelling and Shortness of breath). 30 tablet 6   gabapentin (NEURONTIN) 300 MG capsule Take 1 capsule (300 mg total) by mouth 2 (two) times daily. 60 capsule 6   isosorbide mononitrate (IMDUR) 30 MG 24 hr tablet Take 1 tablet (30 mg total) by mouth daily. 30 tablet 3   methocarbamol (ROBAXIN) 500 MG tablet Take 1 tablet (500 mg total) by mouth every 8 (eight) hours as needed for muscle spasms. 90 tablet 1   metoprolol succinate (TOPROL-XL) 25 MG 24 hr tablet Take 1 tablet (25 mg total) by mouth daily. 90 tablet 1   nitroGLYCERIN (NITROSTAT) 0.4 MG SL tablet Place 1 tablet (0.4 mg total) under the tongue every 5 (five) minutes as needed for chest pain.  25 tablet 3   oxyCODONE-acetaminophen (PERCOCET) 10-325 MG tablet Take 1 tablet by mouth every 4 (four) hours as needed for pain. 30 tablet 0   pantoprazole (PROTONIX) 40 MG tablet TAKE 1 TABLET (40 MG TOTAL) BY MOUTH DAILY. (Patient taking differently: Take 40 mg by mouth daily.) 30 tablet 6   triamcinolone cream (KENALOG) 0.1 % Apply 1  application topically 2 (two) times daily. 80 g 1   vitamin B-12 (CYANOCOBALAMIN) 1000 MCG tablet Take 1 tablet (1,000 mcg total) by mouth daily. 30 tablet 5   budesonide-formoterol (SYMBICORT) 160-4.5 MCG/ACT inhaler INHALE 2 PUFFS INTO THE LUNGS 2 (TWO) TIMES DAILY. 10.2 g 6   No facility-administered medications prior to visit.     ROS Review of Systems  Constitutional:  Negative for activity change and appetite change.  HENT:  Negative for sinus pressure and sore throat.   Eyes:  Negative for visual disturbance.  Respiratory:  Positive for shortness of breath. Negative for cough and chest tightness.   Cardiovascular:  Negative for chest pain and leg swelling.  Gastrointestinal:  Negative for abdominal distention, abdominal pain, constipation and diarrhea.  Endocrine: Negative.   Genitourinary:  Negative for dysuria.  Musculoskeletal:  Negative for joint swelling and myalgias.  Skin:  Negative for rash.  Allergic/Immunologic: Negative.   Neurological:  Positive for weakness. Negative for light-headedness and numbness.  Psychiatric/Behavioral:  Negative for dysphoric mood and suicidal ideas.    Objective:  BP (!) 142/88   Pulse 72   Ht 6\' 2"  (1.88 m)   Wt 215 lb 6.4 oz (97.7 kg)   SpO2 98%   BMI 27.66 kg/m   BP/Weight 12/30/2020 12/01/2020 3/82/5053  Systolic BP 976 734 193  Diastolic BP 88 98 81  Wt. (Lbs) 215.4 220 -  BMI 27.66 28.25 -      Physical Exam Constitutional:      Appearance: He is well-developed.  Cardiovascular:     Rate and Rhythm: Normal rate.     Heart sounds: Normal heart sounds. No murmur heard. Pulmonary:      Effort: Pulmonary effort is normal.     Breath sounds: Normal breath sounds. No wheezing or rales.  Chest:     Chest wall: No tenderness.  Abdominal:     General: Bowel sounds are normal. There is no distension.     Palpations: Abdomen is soft. There is no mass.     Tenderness: There is no abdominal tenderness.  Musculoskeletal:     Right lower leg: No edema.     Left lower leg: No edema.  Neurological:     Mental Status: He is alert and oriented to person, place, and time.     Comments: Normal strength in bilateral upper extremities Lower extremities-3+/5 bilaterally  Psychiatric:        Mood and Affect: Mood normal.    CMP Latest Ref Rng & Units 11/02/2020 08/13/2020 05/04/2020  Glucose 70 - 99 mg/dL 114(H) - -  BUN 6 - 20 mg/dL 10 - -  Creatinine 0.61 - 1.24 mg/dL 0.76 - -  Sodium 135 - 145 mmol/L 136 - -  Potassium 3.5 - 5.1 mmol/L 3.5 - -  Chloride 98 - 111 mmol/L 106 - -  CO2 22 - 32 mmol/L 21(L) - -  Calcium 8.9 - 10.3 mg/dL 9.1 - -  Total Protein 6.0 - 8.5 g/dL - 7.8 7.0  Total Bilirubin 0.0 - 1.2 mg/dL - <0.2 0.5  Alkaline Phos 44 - 121 IU/L - 122(H) 122(H)  AST 0 - 40 IU/L - 18 22  ALT 0 - 44 IU/L - 25 19    Lipid Panel     Component Value Date/Time   CHOL 168 08/13/2020 1105   TRIG 104 08/13/2020 1105   HDL 69 08/13/2020 1105   CHOLHDL 2.4 08/13/2020 1105   CHOLHDL 3.1 01/01/2015 0208   VLDL  12 01/01/2015 0208   LDLCALC 80 08/13/2020 1105    CBC    Component Value Date/Time   WBC 4.4 12/01/2020 1123   WBC 6.2 11/02/2020 0838   RBC 4.40 12/01/2020 1123   RBC 4.33 11/02/2020 0838   HGB 13.5 12/01/2020 1123   HCT 39.3 12/01/2020 1123   PLT 174 12/01/2020 1123   MCV 89 12/01/2020 1123   MCH 30.7 12/01/2020 1123   MCH 30.9 11/02/2020 0838   MCHC 34.4 12/01/2020 1123   MCHC 32.5 11/02/2020 0838   RDW 12.9 12/01/2020 1123   LYMPHSABS 1.2 12/01/2020 1123   MONOABS 0.5 11/02/2020 0838   EOSABS 0.3 12/01/2020 1123   BASOSABS 0.0 12/01/2020 1123    Lab  Results  Component Value Date   HGBA1C 5.9 (H) 08/09/2020    Assessment & Plan:  1. Spinal stenosis of lumbar region with radiculopathy He continues to have bilateral lower extremity weakness Advised to reach out to his neurosurgeon Dr. Trenton Gammon due to ongoing symptoms - Ambulatory referral to Physical Therapy  2. Weakness of both lower extremities Symptoms surprisingly have improved in the past with prednisone I will proceed with ordering labs to exclude autoimmune condition including proximal myopathy, polymyalgia rheumatica - Ambulatory referral to Physical Therapy - predniSONE (DELTASONE) 20 MG tablet; Take 1 tablet (20 mg total) by mouth daily with breakfast.  Dispense: 5 tablet; Refill: 0 - Aldolase; Future - Sedimentation Rate; Future - Anti-DNA antibody, double-stranded; Future - Rheumatoid Arthritis Diagnostic Panel; Future - C-reactive protein; Future  3. Hypoxia 6-minute test does not qualify him for home oxygen as he desaturated to 89% on room air but will need 88% to qualify Underlying history of COPD but has been compliant with his inhalers Reviewed PFTs from 2018-mild obstructive defect, mild early parenchymal disease We will proceed with low-dose CT chest to screen for lung cancer and consider repeat PFT after that. Will also refer to pulmonary if symptoms persist. Advised to work on smoking cessation  4. History of smoking 30 or more pack years - CT CHEST LUNG CA SCREEN LOW DOSE W/O CM; Future    Meds ordered this encounter  Medications   predniSONE (DELTASONE) 20 MG tablet    Sig: Take 1 tablet (20 mg total) by mouth daily with breakfast.    Dispense:  5 tablet    Refill:  0    Follow-up: Return for medical conditions keep previously scheduled appointment.       Charlott Rakes, MD, FAAFP. [facility] [place], [place]   12/30/2020, 12:51 PM

## 2020-12-30 NOTE — Progress Notes (Signed)
Having pain and weakness in both legs.

## 2020-12-30 NOTE — Patient Instructions (Signed)
Shortness of Breath, Adult Shortness of breath means you have trouble breathing. Shortness of breath could be a sign of a medical problem. Follow these instructions at home:  Watch for any changes in your symptoms. Do not use any products that contain nicotine or tobacco, such as cigarettes, e-cigarettes, and chewing tobacco. Do not smoke. Smoking can cause shortness of breath. If you need help to quit smoking, ask your doctor. Avoid things that can make it harder to breathe, such as: Mold. Dust. Air pollution. Chemical smells. Things that can cause allergy symptoms (allergens), if you have allergies. Keep your living space clean. Use products that help remove mold and dust. Rest as needed. Slowly return to your normal activities. Take over-the-counter and prescription medicines only as told by your doctor. This includes oxygen therapy and inhaled medicines. Keep all follow-up visits as told by your doctor. This is important. Contact a doctor if: Your condition does not get better as soon as expected. You have a hard time doing your normal activities, even after you rest. You have new symptoms. Get help right away if: Your shortness of breath gets worse. You have trouble breathing when you are resting. You feel light-headed or you pass out (faint). You have a cough that is not helped by medicines. You cough up blood. You have pain with breathing. You have pain in your chest, arms, shoulders, or belly (abdomen). You have a fever. You cannot walk up stairs. You cannot exercise the way you normally do. These symptoms may represent a serious problem that is an emergency. Do not wait to see if the symptoms will go away. Get medical help right away. Call your local emergency services (911 in the [place] Do not drive yourself to the hospital. Summary Shortness of breath is when you have trouble breathing enough air. It can be a sign of a medical problem. Avoid things that make it hard for  you to breathe, such as smoking, pollution, mold, and dust. Watch for any changes in your symptoms. Contact your doctor if you do not get better or you get worse. This information is not intended to replace advice given to you by your health care provider. Make sure you discuss any questions you have with your health care provider. Document Revised: 08/13/2017 Document Reviewed: 08/13/2017 Elsevier Patient Education  2022 [employer].

## 2021-01-03 ENCOUNTER — Other Ambulatory Visit: Payer: Self-pay

## 2021-01-03 ENCOUNTER — Other Ambulatory Visit: Payer: Medicare Other

## 2021-01-04 ENCOUNTER — Ambulatory Visit: Payer: Medicare Other | Attending: Family Medicine

## 2021-01-04 ENCOUNTER — Other Ambulatory Visit: Payer: Self-pay

## 2021-01-04 ENCOUNTER — Other Ambulatory Visit: Payer: Self-pay | Admitting: Family Medicine

## 2021-01-04 DIAGNOSIS — R29898 Other symptoms and signs involving the musculoskeletal system: Secondary | ICD-10-CM | POA: Diagnosis not present

## 2021-01-04 DIAGNOSIS — J3089 Other allergic rhinitis: Secondary | ICD-10-CM

## 2021-01-04 NOTE — Telephone Encounter (Signed)
Requested medications are due for refill today.  yes  Requested medications are on the active medications list.  yes  Last refill. 01/01/2020  Future visit scheduled.   yes  Notes to clinic.  Prescription is expired.

## 2021-01-05 LAB — ALDOLASE: Aldolase: 6.7 U/L (ref 3.3–10.3)

## 2021-01-05 LAB — SEDIMENTATION RATE: Sed Rate: 21 mm/hr (ref 0–30)

## 2021-01-05 LAB — C-REACTIVE PROTEIN: CRP: 8 mg/L (ref 0–10)

## 2021-01-05 LAB — ANTI-DNA ANTIBODY, DOUBLE-STRANDED: dsDNA Ab: 6 IU/mL (ref 0–9)

## 2021-01-06 LAB — RA QN+CCP(IGG/A)+SJOSSA+SJOSSB
Cyclic Citrullin Peptide Ab: 9 units (ref 0–19)
ENA SSA (RO) Ab: <0.2 AI (ref 0.0–0.9)
ENA SSB (LA) Ab: <0.2 AI (ref 0.0–0.9)
Rheumatoid fact SerPl-aCnc: 13.1 IU/mL (ref <14.0)

## 2021-01-06 MED ORDER — FLUTICASONE PROPIONATE 50 MCG/ACT NA SUSP
NASAL | 2 refills | Status: DC
Start: 2021-01-06 — End: 2021-05-17
  Filled 2021-01-06 – 2021-01-26 (×2): qty 16, 30d supply, fill #0

## 2021-01-07 ENCOUNTER — Other Ambulatory Visit: Payer: Self-pay

## 2021-01-07 ENCOUNTER — Telehealth: Payer: Self-pay

## 2021-01-07 ENCOUNTER — Ambulatory Visit (HOSPITAL_COMMUNITY): Admission: RE | Admit: 2021-01-07 | Payer: Medicare Other | Source: Ambulatory Visit

## 2021-01-07 ENCOUNTER — Telehealth: Payer: Self-pay | Admitting: Internal Medicine

## 2021-01-07 DIAGNOSIS — E785 Hyperlipidemia, unspecified: Secondary | ICD-10-CM

## 2021-01-07 DIAGNOSIS — I2510 Atherosclerotic heart disease of native coronary artery without angina pectoris: Secondary | ICD-10-CM

## 2021-01-07 MED ORDER — EVOLOCUMAB 140 MG/ML ~~LOC~~ SOAJ
SUBCUTANEOUS | 3 refills | Status: DC
Start: 2021-01-07 — End: 2022-06-14
  Filled 2021-01-07: qty 6, fill #0
  Filled 2021-01-26 – 2021-05-17 (×2): qty 6, 84d supply, fill #0
  Filled 2021-08-30 – 2021-12-13 (×2): qty 6, 84d supply, fill #1

## 2021-01-07 NOTE — Telephone Encounter (Signed)
-----   Message from Charlott Rakes, MD sent at 01/06/2021  1:40 PM EDT ----- Please inform the patient that labs are normal. Thank you.

## 2021-01-07 NOTE — Telephone Encounter (Signed)
-----   Message from Charlott Rakes, MD sent at 01/06/2021  1:38 PM EDT ----- Please inform the patient that labs are normal. Thank you.

## 2021-01-07 NOTE — Telephone Encounter (Signed)
*  STAT* If patient is at the pharmacy, call can be transferred to refill team.   1. Which medications need to be refilled? (please list name of each medication and dose if known)  Evolocumab 140 MG/ML SOAJ  2. Which pharmacy/location (including street and city if local pharmacy) is medication to be sent to? [facility] [place]  3. Do they need a 30 day or 90 day supply? 90 day with refills    Patient said he is having a hard time getting this medicine from the pharmacy.

## 2021-01-07 NOTE — Telephone Encounter (Signed)
Patient name and DOB has been verified Patient was informed of lab results. Patient had no questions.  

## 2021-01-10 ENCOUNTER — Other Ambulatory Visit: Payer: Self-pay

## 2021-01-10 ENCOUNTER — Other Ambulatory Visit: Payer: Self-pay | Admitting: Family Medicine

## 2021-01-10 DIAGNOSIS — R29898 Other symptoms and signs involving the musculoskeletal system: Secondary | ICD-10-CM

## 2021-01-11 ENCOUNTER — Ambulatory Visit: Payer: Self-pay

## 2021-01-11 ENCOUNTER — Other Ambulatory Visit: Payer: Self-pay

## 2021-01-11 ENCOUNTER — Ambulatory Visit (HOSPITAL_COMMUNITY)
Admission: RE | Admit: 2021-01-11 | Discharge: 2021-01-11 | Disposition: A | Discharge status: Home / Self Care | Payer: Medicare Other | Source: Ambulatory Visit | Attending: Family Medicine | Admitting: Family Medicine

## 2021-01-11 DIAGNOSIS — Z87891 Personal history of nicotine dependence: Secondary | ICD-10-CM | POA: Insufficient documentation

## 2021-01-11 DIAGNOSIS — F17210 Nicotine dependence, cigarettes, uncomplicated: Secondary | ICD-10-CM | POA: Diagnosis not present

## 2021-01-11 NOTE — Telephone Encounter (Signed)
Requested medications are due for refill today NO  Requested medications are on the active medication list YES for 5 days only  Last refill 12/30/20  Last visit 12/30/20  Future visit scheduled 02/10/21  Notes to clinic Was ordered for 5 days only, please assess. Not Delegated.  Requested Prescriptions  Pending Prescriptions Disp Refills   predniSONE (DELTASONE) 20 MG tablet 5 tablet 0    Sig: Take 1 tablet (20 mg total) by mouth daily with breakfast.     Not Delegated - Endocrinology:  Oral Corticosteroids Failed - 01/10/2021 12:50 PM      Failed - This refill cannot be delegated      Failed - Last BP in normal range    BP Readings from Last 1 Encounters:  12/30/20 (!) 142/88          Passed - Valid encounter within last 6 months    Recent Outpatient Visits           1 week ago Spinal stenosis of lumbar region with radiculopathy   [place], [place], MD   1 month ago Rash and nonspecific skin eruption   [place], [place], MD   5 months ago Screening for diabetes mellitus   [place], [place], MD   1 year ago Tobacco abuse   [place], [name], MD   1 year ago Chronic bilateral low back pain without sciatica   [place], [place], MD       Future Appointments             In 1 month Charlott Rakes, MD [place]

## 2021-01-11 NOTE — Telephone Encounter (Signed)
Pt called and stated he had back sx 3 months ago and has lost 35 lbs. He states he is very weak and feels dizzy and lightheaded and has poor appetite to where he has barely ate anything in 3 days. He says it is hard for him to get around d/t he is afraid he's going to fall r/t the weakness and dizziness. Advised pt to schedule appt, appt scheduled for tomorrow at 1050 for telephone visit. Pt needs to be called on [phone] for appt. Pt has appt today for xray at [facility] and encouraged pt to be careful and seek tx at ED if faints or get SOB. Care advice given, pt verbalized understanding.   Summary: FU pt weak eaten nothing in 3 days   Pt has called and states he is very weak as has not eaten anything in 3 days as since back surgery does not feel like eating. He has to go to [facility] for X-rays and is afraid he might pass out. He did say he had an Ensure and is drinking that but still feels extremely weak. Pls fu as pt will be able to talk till around 12:15. He wanted Dr Margarita Rana to know what was going on with him.  Has appt  [place] in Nov Fu for now at [phone]. States he has lost 35 lbs.        Reason for Disposition  [1] MODERATE weakness (i.e., interferes with work, school, normal activities) AND [2] persists > 3 days  Answer Assessment - Initial Assessment Questions 1. DESCRIPTION: "Describe how you are feeling."     Dizzy, lightheaded, weak, feel like you fall 2. SEVERITY: "How bad is it?"  "Can you stand and walk?"   - MILD - Feels weak or tired, but does not interfere with work, school or normal activities   - [place] to stand and walk; weakness interferes with work, school, or normal activities   - SEVERE - Unable to stand or walk     Unable to get around d/t weakness 3. ONSET:  "When did the weakness begin?"     3 months 4. CAUSE: "What do you think is causing the weakness?"     Had back sx 5. MEDICINES: "Have you recently started a new medicine or had a change in the amount of a  medicine?"     No 6. OTHER SYMPTOMS: "Do you have any other symptoms?" (e.g., chest pain, fever, cough, SOB, vomiting, diarrhea, bleeding, other areas of pain)     Heartburn,  7. PREGNANCY: "Is there any chance you are pregnant?" "When was your last menstrual period?"     N/A  Protocols used: Weakness (Generalized) and Fatigue-A-AH

## 2021-01-12 ENCOUNTER — Ambulatory Visit: Payer: Medicare Other | Attending: Physician Assistant | Admitting: Physician Assistant

## 2021-01-12 ENCOUNTER — Other Ambulatory Visit: Payer: Self-pay

## 2021-01-12 DIAGNOSIS — R630 Anorexia: Secondary | ICD-10-CM

## 2021-01-12 MED ORDER — PREDNISONE 20 MG PO TABS
20.0000 mg | ORAL_TABLET | Freq: Every day | ORAL | 0 refills | Status: DC
  Filled 2021-01-12 – 2021-01-26 (×2): qty 5, 5d supply, fill #0

## 2021-01-12 NOTE — Progress Notes (Signed)
Established Patient Office Visit  Subjective:  Patient ID: [name], male    DOB: 1960-07-13  Age: 60 y.o. MRN: [id number]  CC:  Chief Complaint  Patient presents with   Extremity Weakness   Virtual Visit via Telephone Note  I connected with [name] on 01/14/21 at 10:50 AM EDT by telephone and verified that I am speaking with the correct person using two identifiers.  Location: Patient: [facility]   I discussed the limitations, risks, security and privacy concerns of performing an evaluation and management service by telephone and the availability of in person appointments. I also discussed with the patient that there may be a patient responsible charge related to this service. The patient expressed understanding and agreed to proceed.   History of Present Illness:  HPI KIKO RIPP states that he has been having a very low appetite, has not been eating much since he had his back surgery 3 months ago, states that he believes he has lost approximately 35 pounds.  Reports that he does not have energy to cook some meals, but does have to take breaks.  Reports that his neighbor did bring him some Ensure yesterday and he believes this is starting to help.  States that he is staying hydrated.  States that he has been too weak to attend his physical therapy visits.   Observations/Objective: Medical history and current medications reviewed, no physical exam completed     Past Medical History:  Diagnosis Date   Asthma    CAD (coronary artery disease)    a. 09/2013 NSTEMI/PCI: LM nl, LAD 40-50%, D1 100 (2.25 x 28 Vision BMS), LCX min irregs, RI 60-70, 30, RCA 40-50/50-58ms/p, EF 45-50%.  b. cath 12/2014 -occulded BMS in diag, 50% pro to mid LAD, 60% ramus, 30% RCA    Chest pain 08/2015   Chronic diastolic CHF (congestive heart failure) ([employer])    a. 09/2014 EF 45-50% by LV gram;  b. 01/2014 Echo: EF 55-60%, Gr 1 DD.    Cocaine abuse ([place])    COPD (chronic obstructive pulmonary disease) ([place])    DVT (deep venous thrombosis) ([place])    "I had ~ 10 in each leg" (12/20/2016)   Essential hypertension    Hepatitis C    "clear free over a year now"   High cholesterol    Myocardial infarction [facility]) ~ 2014   Osteoarthritis    a. hands and toes.   Pneumonia    "several times" (12/20/2016)   Pulmonary embolism ([place])    a. 2012 - s/p IVC filter;  b. prev on eliquis - noncompliant.   Shortness of breath dyspnea    Sinus headache    Squamous cell skin cancer 07/13/2016   "foot"   Tobacco abuse     Past Surgical History:  Procedure Laterality Date   CARDIAC CATHETERIZATION N/A 01/04/2015   Procedure: Left Heart Cath and Coronary Angiography;  Surgeon: Burnell Blanks, MD;  Location: [facility];  Service: Cardiovascular;  Laterality: N/A;   CORONARY ANGIOPLASTY WITH STENT PLACEMENT  10/21/13   BMS to D1   CYST EXCISION N/A 05/10/2016   Procedure: EXCISION OF SEBACEOUS CYST UPPER BACK;  Surgeon: Donnie Mesa, MD;  Location: [facility];  Service: General;  Laterality: N/A;   FOOT SURGERY Right ~ 06/2016   "said there was skin cancer on it"   LEFT HEART CATHETERIZATION WITH CORONARY ANGIOGRAM N/A 10/21/2013   Procedure: LEFT HEART CATHETERIZATION  WITH CORONARY ANGIOGRAM;  Surgeon: Leonie Man, MD;  Location: [facility];  Service: Cardiovascular;  Laterality: N/A;   VENA CAVA FILTER PLACEMENT  ~ 2007-~ 2017   "1 in my neck; 2 in my wrist"    Family History  Problem Relation Age of Onset   Asthma Mother    Allergies Mother    Allergies Sister    Allergies Brother    Deep vein thrombosis Brother        two brothers with recurrent DVT   Heart attack Father        a.60s b. deceased in his 94s   Coronary artery disease Father    Colon cancer Neg Hx    Liver cancer Neg Hx    Esophageal cancer Neg Hx    Colon polyps Neg Hx    Rectal cancer Neg Hx    Pancreatic cancer Neg Hx    Stomach cancer Neg  Hx     Social History   Socioeconomic History   Marital status: Single    Spouse name: Not on file   Number of children: Not on file   Years of education: Not on file   Highest education level: Not on file  Occupational History   Occupation: unemployed  Tobacco Use   Smoking status: Every Day    Packs/day: 0.25    Years: 39.00    Pack years: 9.75    Types: Cigarettes, Cigars   Smokeless tobacco: Never  Vaping Use   Vaping Use: Never used  Substance and Sexual Activity   Alcohol use: Yes    Alcohol/week: 6.0 standard drinks    Types: 2 Cans of beer, 4 Shots of liquor per week   Drug use: Not Currently    Types: Cocaine    Comment: 12/20/2016 "might have used some the other day; I'm not sure"   Sexual activity: Not Currently  Other Topics Concern   Not on file  Social History Narrative   Lives in [place].   Social Determinants of Health   Financial Resource Strain: Not on file  Food Insecurity: Not on file  Transportation Needs: Not on file  Physical Activity: Not on file  Stress: Not on file  Social Connections: Not on file  Intimate Partner Violence: Not on file    Outpatient Medications Prior to Visit  Medication Sig Dispense Refill   albuterol (PROAIR HFA) 108 (90 Base) MCG/ACT inhaler Inhale 2 puffs into the lungs every 6 (six) hours as needed for wheezing or shortness of breath. 18 g 6   albuterol (PROVENTIL) (2.5 MG/3ML) 0.083% nebulizer solution Take 3 mLs (2.5 mg total) by nebulization every 6 (six) hours as needed for wheezing or shortness of breath. 75 mL 3   apixaban (ELIQUIS) 5 MG TABS tablet TAKE 1 TABLET (5 MG TOTAL) BY MOUTH 2 (TWO) TIMES DAILY. 60 tablet 6   budesonide-formoterol (SYMBICORT) 160-4.5 MCG/ACT inhaler INHALE 2 PUFFS INTO THE LUNGS 2 (TWO) TIMES DAILY. 10.2 g 6   cetirizine (ZYRTEC) 10 MG tablet Take 1 tablet (10 mg total) by mouth daily. (Patient taking differently: Take 10 mg by mouth daily as needed for allergies.) 30 tablet 3    Evolocumab 140 MG/ML SOAJ INJECT 140 MG INTO THE SKIN EVERY 14 (FOURTEEN) DAYS. 6 mL 3   ezetimibe (ZETIA) 10 MG tablet TAKE 1 TABLET (10 MG TOTAL) BY MOUTH DAILY. (Patient taking differently: Take 10 mg by mouth daily.) 90 tablet 3   fluticasone (FLONASE) 50 MCG/ACT nasal spray  PLACE 2 SPRAYS INTO BOTH NOSTRILS 2 (TWO) TIMES DAILY. 16 g 2   Fluticasone-Umeclidin-Vilant 100-62.5-25 MCG/INH AEPB INHALE 1 INHALER INTO THE LUNGS DAILY. (Patient taking differently: Inhale 1 puff into the lungs daily.) 60 each 6   furosemide (LASIX) 40 MG tablet Take 1 tablet (40 mg total) by mouth daily as needed (May take 40mg  Daily for Swelling and Shortness of breath). 30 tablet 6   gabapentin (NEURONTIN) 300 MG capsule Take 1 capsule (300 mg total) by mouth 2 (two) times daily. 60 capsule 6   isosorbide mononitrate (IMDUR) 30 MG 24 hr tablet Take 1 tablet (30 mg total) by mouth daily. 30 tablet 3   methocarbamol (ROBAXIN) 500 MG tablet Take 1 tablet (500 mg total) by mouth every 8 (eight) hours as needed for muscle spasms. 90 tablet 1   metoprolol succinate (TOPROL-XL) 25 MG 24 hr tablet Take 1 tablet (25 mg total) by mouth daily. 90 tablet 1   nitroGLYCERIN (NITROSTAT) 0.4 MG SL tablet Place 1 tablet (0.4 mg total) under the tongue every 5 (five) minutes as needed for chest pain. 25 tablet 3   oxyCODONE-acetaminophen (PERCOCET) 10-325 MG tablet Take 1 tablet by mouth every 4 (four) hours as needed for pain. 30 tablet 0   pantoprazole (PROTONIX) 40 MG tablet TAKE 1 TABLET (40 MG TOTAL) BY MOUTH DAILY. (Patient taking differently: Take 40 mg by mouth daily.) 30 tablet 6   triamcinolone cream (KENALOG) 0.1 % Apply 1 application topically 2 (two) times daily. 80 g 1   vitamin B-12 (CYANOCOBALAMIN) 1000 MCG tablet Take 1 tablet (1,000 mcg total) by mouth daily. 30 tablet 5   predniSONE (DELTASONE) 20 MG tablet Take 1 tablet (20 mg total) by mouth daily with breakfast. 5 tablet 0   No facility-administered medications  prior to visit.    Allergies  Allergen Reactions   Atorvastatin     myalgia    ROS Review of Systems  Constitutional:  Negative for chills and fever.  HENT: Negative.    Eyes: Negative.   Respiratory:  Negative for shortness of breath.   Cardiovascular:  Negative for chest pain.  Gastrointestinal:  Negative for abdominal pain, diarrhea, nausea and vomiting.  Endocrine: Negative.   Genitourinary: Negative.   Musculoskeletal: Negative.   Skin: Negative.   Allergic/Immunologic: Negative.   Neurological: Negative.   Hematological: Negative.   Psychiatric/Behavioral: Negative.       Objective:      There were no vitals taken for this visit. Wt Readings from Last 3 Encounters:  12/30/20 215 lb 6.4 oz (97.7 kg)  12/01/20 220 lb (99.8 kg)  11/02/20 220 lb (99.8 kg)     Health Maintenance Due  Topic Date Due   Pneumococcal Vaccine 66-36 Years old (3 - PCV) 01/02/2016   COVID-19 Vaccine (3 - Pfizer risk series) 08/16/2019    There are no preventive care reminders to display for this patient.  Lab Results  Component Value Date   TSH 1.810 03/12/2020   Lab Results  Component Value Date   WBC 4.4 12/01/2020   HGB 13.5 12/01/2020   HCT 39.3 12/01/2020   MCV 89 12/01/2020   PLT 174 12/01/2020   Lab Results  Component Value Date   NA 136 11/02/2020   K 3.5 11/02/2020   CO2 21 (L) 11/02/2020   GLUCOSE 114 (H) 11/02/2020   BUN 10 11/02/2020   CREATININE 0.76 11/02/2020   BILITOT <0.2 08/13/2020   ALKPHOS 122 (H) 08/13/2020   AST 18  08/13/2020   ALT 25 08/13/2020   PROT 7.8 08/13/2020   ALBUMIN 4.4 08/13/2020   CALCIUM 9.1 11/02/2020   ANIONGAP 9 11/02/2020   Lab Results  Component Value Date   CHOL 168 08/13/2020   Lab Results  Component Value Date   HDL 69 08/13/2020   Lab Results  Component Value Date   LDLCALC 80 08/13/2020   Lab Results  Component Value Date   TRIG 104 08/13/2020   Lab Results  Component Value Date   CHOLHDL 2.4  08/13/2020   Lab Results  Component Value Date   HGBA1C 5.9 (H) 08/09/2020      Assessment & Plan:   Problem List Items Addressed This Visit   None Visit Diagnoses     Anorexia    -  Primary       No orders of the defined types were placed in this encounter.   Assessment and Plan: . Anorexia Patient agreeable to start drinking Ensure twice a day, and eating 3 meals a day regardless of desire to eat.  Patient encouraged to continue proper hydration.  Red flags given for prompt reevaluation.   Follow Up Instructions:    I discussed the assessment and treatment plan with the patient. The patient was provided an opportunity to ask questions and all were answered. The patient agreed with the plan and demonstrated an understanding of the instructions.   The patient was advised to call back or seek an in-person evaluation if the symptoms worsen or if the condition fails to improve as anticipated.  I provided 21 minutes of non-face-to-face time during this encounter.  No AVS created due to patient declining MyChart  Follow-up: Return if symptoms worsen or fail to improve.    Loraine Grip Mayers, PA-C

## 2021-01-12 NOTE — Progress Notes (Signed)
Patient has eaten today since 3 days and has only had ensure. Patient reports losing 30 pounds in since the surgery. Patient complains of back pain.

## 2021-01-13 ENCOUNTER — Other Ambulatory Visit: Payer: Self-pay | Admitting: Family Medicine

## 2021-01-13 ENCOUNTER — Telehealth: Payer: Self-pay

## 2021-01-13 DIAGNOSIS — J438 Other emphysema: Secondary | ICD-10-CM

## 2021-01-13 NOTE — Telephone Encounter (Signed)
Patient name and DOB has been verified Patient was informed of lab results. Patient had no questions.  

## 2021-01-13 NOTE — Telephone Encounter (Signed)
-----   Message from Charlott Rakes, MD sent at 01/13/2021  8:11 AM EDT ----- Please inform him that his CT chest to screen for lung cancer is negative for lung cancer.  He reveals presence of emphysema, cholesterol deposits in his arteries and a right kidney stone.  Given his ongoing shortness of breath I have referred him to pulmonary.

## 2021-01-14 ENCOUNTER — Other Ambulatory Visit: Payer: Self-pay

## 2021-01-14 ENCOUNTER — Encounter: Payer: Self-pay | Admitting: Physician Assistant

## 2021-01-19 ENCOUNTER — Other Ambulatory Visit: Payer: Self-pay

## 2021-01-26 ENCOUNTER — Other Ambulatory Visit: Payer: Self-pay

## 2021-01-31 ENCOUNTER — Other Ambulatory Visit: Payer: Self-pay

## 2021-02-03 DIAGNOSIS — M48062 Spinal stenosis, lumbar region with neurogenic claudication: Secondary | ICD-10-CM | POA: Diagnosis not present

## 2021-02-08 ENCOUNTER — Ambulatory Visit (HOSPITAL_COMMUNITY): Payer: Medicare Other | Attending: Cardiovascular Disease

## 2021-02-08 ENCOUNTER — Other Ambulatory Visit: Payer: Self-pay

## 2021-02-08 DIAGNOSIS — I5042 Chronic combined systolic (congestive) and diastolic (congestive) heart failure: Secondary | ICD-10-CM | POA: Insufficient documentation

## 2021-02-08 DIAGNOSIS — I5022 Chronic systolic (congestive) heart failure: Secondary | ICD-10-CM | POA: Diagnosis not present

## 2021-02-08 LAB — ECHOCARDIOGRAM COMPLETE
Area-P 1/2: 2.01 cm2
S' Lateral: 3.8 cm

## 2021-02-08 MED ORDER — PERFLUTREN LIPID MICROSPHERE
2.0000 mL | INTRAVENOUS | Status: AC | PRN
  Administered 2021-02-08: 2 mL via INTRAVENOUS

## 2021-02-10 ENCOUNTER — Ambulatory Visit: Payer: Medicare Other | Admitting: Family Medicine

## 2021-02-14 ENCOUNTER — Ambulatory Visit: Payer: Medicare Other | Attending: Family Medicine | Admitting: Physical Therapy

## 2021-02-15 DIAGNOSIS — Z1152 Encounter for screening for COVID-19: Secondary | ICD-10-CM | POA: Diagnosis not present

## 2021-02-28 ENCOUNTER — Ambulatory Visit: Payer: Medicare Other | Attending: Family Medicine

## 2021-03-03 ENCOUNTER — Institutional Professional Consult (permissible substitution): Payer: Medicare Other

## 2021-03-08 ENCOUNTER — Ambulatory Visit: Payer: Medicare Other | Admitting: Podiatry

## 2021-03-10 ENCOUNTER — Ambulatory Visit: Payer: Medicare Other | Admitting: Neurology

## 2021-03-22 ENCOUNTER — Institutional Professional Consult (permissible substitution): Payer: Medicare Other

## 2021-03-28 NOTE — Progress Notes (Signed)
Chief Complaint  Patient presents with   Follow-up    Rm 14. Alone. Pt c/o of bilateral leg pain and weakness. He has difficulty ambulating after sitting. Pt states right sided sciatica pain has improved since his operation in August 2022. He c/o of middle back pain.   HISTORICAL  [name] is a 61 year old male, seen in request by his primary care physician Dr. Margarita Rana, [name] for evaluation of lower extremity weakness, gait abnormality, initial evaluation was on March 12, 2020.  I reviewed and summarized the referring note. PMHX. HTN Asthma CAD, DVT since 2014, and PE. Smoker,  2 cigarette this week  He reported more than 10 years history of bilateral lower extremity difficulty, he is a poor historian, reported without clear triggers, he began to notice difficulty getting up from seated position, he has to push on chair arms to get up from seated position, slight worsening of the past 10 years, he gave me an example, he was working the yard, fell to the ground, even with his friend's help, he could not get up from low seated position, he has to climb to the front staff to pull himself up, he denies significant upper extremity weakness  He does complains of slow worsening low back pain, especially on the right side, radiating pain to right lower extremity, also complains of bilateral feet and lower extremity paresthesia, numbness, also involving left calf,  He complains some neck pain, denies radiating pain, denies numbness tingling at his feet, denies bowel bladder incontinence, he denies double vision, no chewing or swallowing difficulty.  He reported a long history of recurrent bilateral lower extremity DVT, PE, went on disability more than 12 years ago.  Lab in Nov 2021, Lipid Panel, LDL 162, cholesterol 231, Methylmalonic acid level 763, BMP, Cal 8.6, Hg 10.3, ferritin 42, B12 259,  HIV -  UPDATE Apr 28 2020: Patient insists that he had difficulty getting up from  seated position for about 10 years, remember he has to push on chair arm to get up from seated position, gradually getting worse, if he fell to the ground, it is very difficult for him to get up, even with the help,  Personally reviewed MRI of cervical and lumbar spine January 2022: Cervical spine showed multilevel degenerative changes, most obvious at C3-4, central disc bulging, posterior ligament foramen hypertrophy, with moderate canal bilateral foraminal narrowing, MRI of lumbar spine prominent spondylitic and facet degenerative changes from L3-S1, most prominent at L5-S1, broad based disc osteophyte protrusion, and ligament flavum and hypertrophy, with mild canal, severe right side moderate left-sided foraminal narrowing,  Update August 31, 2020 SS: Here today for follow-up, reports had 2 ESI injections with Dr. Trenton Gammon, last was 1 month ago, no help. Hasn't been back. When standing, has leg pain bilaterally, feels weak, but right leg is worse. Prednisone helps his symptoms, given 5 day taper last week for right sided leg pain. Right foot/leg is gets numb even with sitting. No B/B incontinence.   Dr. Rhea Belton note 04/28/2020 :Addendum: Evaluation by neurosurgeon Dr. Deri Fuelling on April 30, 2020, cervical spine demonstrate significant multilevel cervical degeneration with moderate spinal stenosis, early cord compression at C3-4, no cord signal abnormality, MRI of the lumbar demonstrate progressive disc degeneration with central disc bulging at L5-S1, he seems to be primarily symptomatic secondary to his low back pain with intermittent radicular symptoms, refer to epidural steroid injection, monitor his symptoms for worsening cervical myelopathy  Update March 29, 2021 SS: Underwent lumbar  decompression and fusion in August 2022 with Dr. Annette Stable after failing conservative management. Claims no better with weakness to legs, especially if in low chair. Claims didn't do PT or rehab because he didn't want to. Is now  looking for somewhere but has cancelled it. Low back pain come and goes, doesn't think any better. Mentions repeatedly legs are weak when standing, unless sitting up high. No numbness to extremities. No B/B incontinence. Seeing Dr. Annette Stable on Jan 11th. Lives alone, no falls. Doesn't have appetite.  REVIEW OF SYSTEMS: Full 14 system review of systems performed and notable only for as above  See HPI  ALLERGIES: Allergies  Allergen Reactions   Atorvastatin     myalgia    HOME MEDICATIONS: Current Outpatient Medications  Medication Sig Dispense Refill   albuterol (PROAIR HFA) 108 (90 Base) MCG/ACT inhaler Inhale 2 puffs into the lungs every 6 (six) hours as needed for wheezing or shortness of breath. 18 g 6   albuterol (PROVENTIL) (2.5 MG/3ML) 0.083% nebulizer solution Take 3 mLs (2.5 mg total) by nebulization every 6 (six) hours as needed for wheezing or shortness of breath. 75 mL 3   cetirizine (ZYRTEC) 10 MG tablet Take 1 tablet (10 mg total) by mouth daily. (Patient taking differently: Take 10 mg by mouth daily as needed for allergies.) 30 tablet 3   Evolocumab 140 MG/ML SOAJ INJECT 140 MG INTO THE SKIN EVERY 14 (FOURTEEN) DAYS. 6 mL 3   ezetimibe (ZETIA) 10 MG tablet TAKE 1 TABLET (10 MG TOTAL) BY MOUTH DAILY. (Patient taking differently: Take 10 mg by mouth daily.) 90 tablet 3   fluticasone (FLONASE) 50 MCG/ACT nasal spray PLACE 2 SPRAYS INTO BOTH NOSTRILS 2 (TWO) TIMES DAILY. 16 g 2   Fluticasone-Umeclidin-Vilant 100-62.5-25 MCG/INH AEPB INHALE 1 INHALER INTO THE LUNGS DAILY. (Patient taking differently: Inhale 1 puff into the lungs daily.) 60 each 6   furosemide (LASIX) 40 MG tablet Take 1 tablet (40 mg total) by mouth daily as needed (May take 40mg  Daily for Swelling and Shortness of breath). 30 tablet 6   gabapentin (NEURONTIN) 300 MG capsule Take 1 capsule (300 mg total) by mouth 2 (two) times daily. 60 capsule 6   isosorbide mononitrate (IMDUR) 30 MG 24 hr tablet Take 1 tablet (30 mg  total) by mouth daily. 30 tablet 3   methocarbamol (ROBAXIN) 500 MG tablet Take 1 tablet (500 mg total) by mouth every 8 (eight) hours as needed for muscle spasms. 90 tablet 1   metoprolol succinate (TOPROL-XL) 25 MG 24 hr tablet Take 1 tablet (25 mg total) by mouth daily. 90 tablet 1   oxyCODONE-acetaminophen (PERCOCET) 10-325 MG tablet Take 1 tablet by mouth every 4 (four) hours as needed for pain. 30 tablet 0   pantoprazole (PROTONIX) 40 MG tablet TAKE 1 TABLET (40 MG TOTAL) BY MOUTH DAILY. (Patient taking differently: Take 40 mg by mouth daily.) 30 tablet 6   predniSONE (DELTASONE) 20 MG tablet Take 1 tablet (20 mg total) by mouth daily with breakfast. 5 tablet 0   triamcinolone cream (KENALOG) 0.1 % Apply 1 application topically 2 (two) times daily. 80 g 1   vitamin B-12 (CYANOCOBALAMIN) 1000 MCG tablet Take 1 tablet (1,000 mcg total) by mouth daily. 30 tablet 5   apixaban (ELIQUIS) 5 MG TABS tablet TAKE 1 TABLET (5 MG TOTAL) BY MOUTH 2 (TWO) TIMES DAILY. 60 tablet 6   budesonide-formoterol (SYMBICORT) 160-4.5 MCG/ACT inhaler INHALE 2 PUFFS INTO THE LUNGS 2 (TWO) TIMES DAILY. 10.2 g  6   nitroGLYCERIN (NITROSTAT) 0.4 MG SL tablet Place 1 tablet (0.4 mg total) under the tongue every 5 (five) minutes as needed for chest pain. 25 tablet 3   No current facility-administered medications for this visit.    PAST MEDICAL HISTORY: Past Medical History:  Diagnosis Date   Asthma    CAD (coronary artery disease)    a. 09/2013 NSTEMI/PCI: LM nl, LAD 40-50%, D1 100 (2.25 x 28 Vision BMS), LCX min irregs, RI 60-70, 30, RCA 40-50/50-68ms/p, EF 45-50%.  b. cath 12/2014 -occulded BMS in diag, 50% pro to mid LAD, 60% ramus, 30% RCA    Chest pain 08/2015   Chronic diastolic CHF (congestive heart failure) ([place])    a. 09/2014 EF 45-50% by LV gram;  b. 01/2014 Echo: EF 55-60%, Gr 1 DD.   Cocaine abuse ([employer])    COPD (chronic obstructive pulmonary disease) ([place])    DVT (deep venous thrombosis) ([place])    "I had ~  10 in each leg" (12/20/2016)   Essential hypertension    Hepatitis C    "clear free over a year now"   High cholesterol    Myocardial infarction [facility]) ~ 2014   Osteoarthritis    a. hands and toes.   Pneumonia    "several times" (12/20/2016)   Pulmonary embolism ([place])    a. 2012 - s/p IVC filter;  b. prev on eliquis - noncompliant.   Shortness of breath dyspnea    Sinus headache    Squamous cell skin cancer 07/13/2016   "foot"   Tobacco abuse     PAST SURGICAL HISTORY: Past Surgical History:  Procedure Laterality Date   CARDIAC CATHETERIZATION N/A 01/04/2015   Procedure: Left Heart Cath and Coronary Angiography;  Surgeon: Burnell Blanks, MD;  Location: [facility];  Service: Cardiovascular;  Laterality: N/A;   CORONARY ANGIOPLASTY WITH STENT PLACEMENT  10/21/13   BMS to D1   CYST EXCISION N/A 05/10/2016   Procedure: EXCISION OF SEBACEOUS CYST UPPER BACK;  Surgeon: Donnie Mesa, MD;  Location: [facility];  Service: General;  Laterality: N/A;   FOOT SURGERY Right ~ 06/2016   "said there was skin cancer on it"   LEFT HEART CATHETERIZATION WITH CORONARY ANGIOGRAM N/A 10/21/2013   Procedure: LEFT HEART CATHETERIZATION WITH CORONARY ANGIOGRAM;  Surgeon: Leonie Man, MD;  Location: [facility] CATH [facility];  Service: Cardiovascular;  Laterality: N/A;   VENA CAVA FILTER PLACEMENT  ~ 2007-~ 2017   "1 in my neck; 2 in my wrist"    FAMILY HISTORY: Family History  Problem Relation Age of Onset   Asthma Mother    Allergies Mother    Allergies Sister    Allergies Brother    Deep vein thrombosis Brother        two brothers with recurrent DVT   Heart attack Father        a.60s b. deceased in his 44s   Coronary artery disease Father    Colon cancer Neg Hx    Liver cancer Neg Hx    Esophageal cancer Neg Hx    Colon polyps Neg Hx    Rectal cancer Neg Hx    Pancreatic cancer Neg Hx    Stomach cancer Neg Hx     SOCIAL HISTORY: Social History   Socioeconomic History   Marital status:  Single    Spouse name: Not on file   Number of children: Not on file   Years of education: Not on file  Highest education level: Not on file  Occupational History   Occupation: unemployed  Tobacco Use   Smoking status: Every Day    Packs/day: 0.25    Years: 39.00    Pack years: 9.75    Types: Cigarettes, Cigars   Smokeless tobacco: Never  Vaping Use   Vaping Use: Never used  Substance and Sexual Activity   Alcohol use: Yes    Alcohol/week: 6.0 standard drinks    Types: 2 Cans of beer, 4 Shots of liquor per week   Drug use: Not Currently    Types: Cocaine    Comment: 12/20/2016 "might have used some the other day; I'm not sure"   Sexual activity: Not Currently  Other Topics Concern   Not on file  Social History Narrative   Lives in [place].   Social Determinants of Health   Financial Resource Strain: Not on file  Food Insecurity: Not on file  Transportation Needs: Not on file  Physical Activity: Not on file  Stress: Not on file  Social Connections: Not on file  Intimate Partner Violence: Not on file   PHYSICAL EXAM   Vitals:   03/29/21 0935  BP: (!) 144/98  Pulse: 99  Weight: 196 lb (88.9 kg)  Height: 6\' 2"  (1.88 m)    Not recorded    Body mass index is 25.16 kg/m.  PHYSICAL EXAMNIATION:  Physical Exam  General: The patient is alert and cooperative at the time of the examination.  Skin: No significant peripheral edema is noted.  Neurologic Exam  Mental status: The patient is alert and oriented x 3 at the time of the examination. The patient has apparent normal recent and remote memory, with an apparently normal attention span and concentration ability.  Cranial nerves: Facial symmetry is present. Speech is normal, no aphasia or dysarthria is noted. Extraocular movements are full. Visual fields are full.  Motor: 4/5 right hip flexion weakness  Sensory examination: Soft touch sensation is symmetric on the face, arms, and legs.  Coordination: The  patient has good finger-nose-finger and heel-to-shin bilaterally, but heel to shin on right feels heavy for him to lift  Gait and station: Gait is wide-based, cautious, stiff. From exam table was able to stand with ease but was cautious  Reflexes: Deep tendon reflexes are symmetric, mildly brisk throughout  DIAGNOSTIC DATA (LABS, IMAGING, TESTING) - I reviewed patient records, labs, notes, testing and imaging myself where available.  ASSESSMENT AND PLAN  ORVEL CUTSFORTH is a 61 y.o. male    1. Gait abnormality 2. Chronic low back pain  -Post lumbar decompression and fusion surgery in August 2022 with Dr. Annette Stable, claims continued symptoms of leg weakness when standing -However, he did not complete physical therapy of his own choosing, he is now interested but has cancelled his last appointments, encouraged him to schedule this, get started in physical therapy, hopefully will help his symptoms -Is seeing Dr. Annette Stable January 11th for follow-up, encouraged to keep this -Follow-up here in 6 months or sooner if needed, going forward determine if needs to continue to follow him, would like to see his baseline after he finishes therapy -MRI of cervical spine showed moderate C3-4 spinal canal stenosis, multilevel lumbar degenerative changes, L5-S1 with severe right, moderate left foraminal narrowing-early cord compression at C3-4  Butler Denmark, Laqueta Jean, [facility] [address], [place],  [address] [phone]

## 2021-03-29 ENCOUNTER — Ambulatory Visit (INDEPENDENT_AMBULATORY_CARE_PROVIDER_SITE_OTHER): Payer: Commercial Managed Care - HMO | Admitting: Neurology

## 2021-03-29 ENCOUNTER — Encounter: Payer: Self-pay | Admitting: Neurology

## 2021-03-29 VITALS — BP 144/98 | HR 99 | Ht 74.0 in | Wt 196.0 lb

## 2021-03-29 DIAGNOSIS — R269 Unspecified abnormalities of gait and mobility: Secondary | ICD-10-CM

## 2021-03-29 DIAGNOSIS — R531 Weakness: Secondary | ICD-10-CM | POA: Diagnosis not present

## 2021-03-29 NOTE — Patient Instructions (Signed)
Please call to schedule physical therapy  Keep appointment with Dr. Annette Stable  See you back in 6 months

## 2021-03-29 NOTE — Progress Notes (Signed)
Chart reviewed, agree above plan,  Will change next follow-up appointment with me, to rule out other potential etiology for his continued complain of gait abnormality, weakness

## 2021-03-30 ENCOUNTER — Other Ambulatory Visit: Payer: Self-pay

## 2021-03-30 ENCOUNTER — Ambulatory Visit (INDEPENDENT_AMBULATORY_CARE_PROVIDER_SITE_OTHER): Payer: Commercial Managed Care - HMO | Admitting: Podiatry

## 2021-03-30 ENCOUNTER — Encounter: Payer: Self-pay | Admitting: Podiatry

## 2021-03-30 DIAGNOSIS — B351 Tinea unguium: Secondary | ICD-10-CM | POA: Diagnosis not present

## 2021-03-30 DIAGNOSIS — M79674 Pain in right toe(s): Secondary | ICD-10-CM | POA: Diagnosis not present

## 2021-03-30 DIAGNOSIS — Z7901 Long term (current) use of anticoagulants: Secondary | ICD-10-CM

## 2021-03-30 DIAGNOSIS — M79675 Pain in left toe(s): Secondary | ICD-10-CM | POA: Diagnosis not present

## 2021-03-30 NOTE — Progress Notes (Signed)
This patient returns to my office for at risk foot care.  This patient requires this care by a professional since this patient will be at risk due to having coagulation defect. Patient is taking eliquis.  This patient is unable to cut nails himself since the patient cannot reach his nails.These nails are painful walking and wearing shoes.  This patient presents for at risk foot care today.  General Appearance  Alert, conversant and in no acute stress.  Vascular  Dorsalis pedis and posterior tibial  pulses are palpable  bilaterally.  Capillary return is within normal limits  bilaterally. Temperature is within normal limits  bilaterally.  Neurologic  Senn-Weinstein monofilament wire test within normal limits  bilaterally. Muscle power within normal limits bilaterally.  Nails Thick disfigured discolored nails with subungual debris  from hallux to fifth toes bilaterally. No evidence of bacterial infection or drainage bilaterally.  Orthopedic  No limitations of motion  feet .  No crepitus or effusions noted.  No bony pathology or digital deformities noted.  Skin  normotropic skin with no porokeratosis noted bilaterally.  No signs of infections or ulcers noted.     Onychomycosis  Pain in right toes  Pain in left toes  Consent was obtained for treatment procedures.   Mechanical debridement of nails 1-5  bilaterally performed with a nail nipper.  Filed with dremel without incident.    Return office visit    3    months                  Told patient to return for periodic foot care and evaluation due to potential at risk complications.   Hosea Hanawalt DPM  

## 2021-04-05 DIAGNOSIS — R55 Syncope and collapse: Secondary | ICD-10-CM | POA: Diagnosis not present

## 2021-04-05 DIAGNOSIS — R42 Dizziness and giddiness: Secondary | ICD-10-CM | POA: Diagnosis not present

## 2021-04-05 DIAGNOSIS — I959 Hypotension, unspecified: Secondary | ICD-10-CM | POA: Diagnosis not present

## 2021-04-06 DIAGNOSIS — M48062 Spinal stenosis, lumbar region with neurogenic claudication: Secondary | ICD-10-CM | POA: Diagnosis not present

## 2021-04-07 ENCOUNTER — Other Ambulatory Visit: Payer: Self-pay

## 2021-04-07 ENCOUNTER — Ambulatory Visit (INDEPENDENT_AMBULATORY_CARE_PROVIDER_SITE_OTHER): Payer: Commercial Managed Care - HMO | Admitting: Pharmacist

## 2021-04-07 VITALS — BP 133/87 | HR 82 | Resp 17 | Ht 74.0 in | Wt 204.0 lb

## 2021-04-07 DIAGNOSIS — I5042 Chronic combined systolic (congestive) and diastolic (congestive) heart failure: Secondary | ICD-10-CM | POA: Diagnosis not present

## 2021-04-07 DIAGNOSIS — Z86711 Personal history of pulmonary embolism: Secondary | ICD-10-CM | POA: Diagnosis not present

## 2021-04-07 DIAGNOSIS — I5032 Chronic diastolic (congestive) heart failure: Secondary | ICD-10-CM

## 2021-04-07 MED ORDER — FUROSEMIDE 40 MG PO TABS
40.0000 mg | ORAL_TABLET | Freq: Every day | ORAL | 1 refills | Status: DC | PRN
  Filled 2021-04-07 – 2021-04-08 (×2): qty 90, 90d supply, fill #0
  Filled 2021-08-30: qty 90, 90d supply, fill #1

## 2021-04-07 MED ORDER — METOPROLOL SUCCINATE ER 25 MG PO TB24
25.0000 mg | ORAL_TABLET | Freq: Every day | ORAL | 1 refills | Status: DC
  Filled 2021-04-07: qty 90, 90d supply, fill #0
  Filled 2021-08-30 – 2021-12-13 (×2): qty 90, 90d supply, fill #1

## 2021-04-07 MED ORDER — ISOSORBIDE MONONITRATE ER 30 MG PO TB24
30.0000 mg | ORAL_TABLET | Freq: Every day | ORAL | 1 refills | Status: DC
  Filled 2021-04-07: qty 90, 90d supply, fill #0
  Filled 2021-08-30 – 2021-12-13 (×2): qty 90, 90d supply, fill #1

## 2021-04-07 MED ORDER — APIXABAN 5 MG PO TABS
ORAL_TABLET | Freq: Two times a day (BID) | ORAL | 1 refills | Status: DC
  Filled 2021-04-07: qty 180, 90d supply, fill #0
  Filled 2021-12-13: qty 180, 90d supply, fill #1

## 2021-04-07 NOTE — Progress Notes (Signed)
Patient ID: [name]                 DOB: 11-30-1960                      MRN: [id number]     HPI: [name] is a 61 y.o. male referred by Dr. Margaretann Loveless to pharmacy clinic for HF medication management. PMH is significant for CAD, NSTEMI, HTN, CHF, COPD, Hep C, history of DVT, history of PE, and substance abuse. Most recent LVEF 40-45% on 02/08/21.  Patient presents today for medication titration after repeat echo.  However, patient has not been taking any medications since his back surgery 4 months ago.  Does not want to take medications without food and reports he has no appetite.  Is having back pain and having issues going from sitting to standing position without assistance.  Reports he has fallen down 4 times.  Has not taken any of his cardiac medications including Eliquis.    Reports he is weak, has SOB, swelling in his ankles, orthostatic dizziness, and occasional chest pain that he relates to GERD.   Does not know how many medications he has at home and struggle with transportation issues.     Is not weighing himself at home but knows he has lost weight due to lack of appetite. Has lost 20# since October.  Current CHF meds:  Toprol XL 25mg  (not taking) Furosemide 40mg  (not taking) Isosorbide 30mg  daily (not taking)  BP goal: <130/80  Wt Readings from Last 3 Encounters:  03/29/21 196 lb (88.9 kg)  12/30/20 215 lb 6.4 oz (97.7 kg)  12/01/20 220 lb (99.8 kg)   BP Readings from Last 3 Encounters:  03/29/21 (!) 144/98  12/30/20 (!) 142/88  12/01/20 (!) 150/98   Pulse Readings from Last 3 Encounters:  03/29/21 99  12/30/20 72  12/01/20 92    Renal function: CrCl cannot be calculated (Patient's most recent lab result is older than the maximum 21 days allowed.).  Past Medical History:  Diagnosis Date   Asthma    CAD (coronary artery disease)    a. 09/2013 NSTEMI/PCI: LM nl, LAD 40-50%, D1 100 (2.25 x 28 Vision BMS), LCX min irregs, RI 60-70, 30, RCA  40-50/50-32ms/p, EF 45-50%.  b. cath 12/2014 -occulded BMS in diag, 50% pro to mid LAD, 60% ramus, 30% RCA    Chest pain 08/2015   Chronic diastolic CHF (congestive heart failure) ([place])    a. 09/2014 EF 45-50% by LV gram;  b. 01/2014 Echo: EF 55-60%, Gr 1 DD.   Cocaine abuse ([place])    COPD (chronic obstructive pulmonary disease) ([place])    DVT (deep venous thrombosis) ([place])    "I had ~ 10 in each leg" (12/20/2016)   Essential hypertension    Hepatitis C    "clear free over a year now"   High cholesterol    Myocardial infarction [facility]) ~ 2014   Osteoarthritis    a. hands and toes.   Pneumonia    "several times" (12/20/2016)   Pulmonary embolism ([place])    a. 2012 - s/p IVC filter;  b. prev on eliquis - noncompliant.   Shortness of breath dyspnea    Sinus headache    Squamous cell skin cancer 07/13/2016   "foot"   Tobacco abuse     Current Outpatient Medications on File Prior to Visit  Medication Sig Dispense Refill   albuterol (PROAIR HFA) 108 (90 Base) MCG/ACT inhaler  Inhale 2 puffs into the lungs every 6 (six) hours as needed for wheezing or shortness of breath. 18 g 6   albuterol (PROVENTIL) (2.5 MG/3ML) 0.083% nebulizer solution Take 3 mLs (2.5 mg total) by nebulization every 6 (six) hours as needed for wheezing or shortness of breath. 75 mL 3   apixaban (ELIQUIS) 5 MG TABS tablet TAKE 1 TABLET (5 MG TOTAL) BY MOUTH 2 (TWO) TIMES DAILY. 60 tablet 6   budesonide-formoterol (SYMBICORT) 160-4.5 MCG/ACT inhaler INHALE 2 PUFFS INTO THE LUNGS 2 (TWO) TIMES DAILY. 10.2 g 6   cetirizine (ZYRTEC) 10 MG tablet Take 1 tablet (10 mg total) by mouth daily. (Patient taking differently: Take 10 mg by mouth daily as needed for allergies.) 30 tablet 3   Evolocumab 140 MG/ML SOAJ INJECT 140 MG INTO THE SKIN EVERY 14 (FOURTEEN) DAYS. 6 mL 3   ezetimibe (ZETIA) 10 MG tablet TAKE 1 TABLET (10 MG TOTAL) BY MOUTH DAILY. (Patient taking differently: Take 10 mg by mouth daily.) 90 tablet 3   fluticasone (FLONASE)  50 MCG/ACT nasal spray PLACE 2 SPRAYS INTO BOTH NOSTRILS 2 (TWO) TIMES DAILY. 16 g 2   Fluticasone-Umeclidin-Vilant 100-62.5-25 MCG/INH AEPB INHALE 1 INHALER INTO THE LUNGS DAILY. (Patient taking differently: Inhale 1 puff into the lungs daily.) 60 each 6   furosemide (LASIX) 40 MG tablet Take 1 tablet (40 mg total) by mouth daily as needed (May take 40mg  Daily for Swelling and Shortness of breath). 30 tablet 6   gabapentin (NEURONTIN) 300 MG capsule Take 1 capsule (300 mg total) by mouth 2 (two) times daily. 60 capsule 6   isosorbide mononitrate (IMDUR) 30 MG 24 hr tablet Take 1 tablet (30 mg total) by mouth daily. 30 tablet 3   methocarbamol (ROBAXIN) 500 MG tablet Take 1 tablet (500 mg total) by mouth every 8 (eight) hours as needed for muscle spasms. 90 tablet 1   metoprolol succinate (TOPROL-XL) 25 MG 24 hr tablet Take 1 tablet (25 mg total) by mouth daily. 90 tablet 1   nitroGLYCERIN (NITROSTAT) 0.4 MG SL tablet Place 1 tablet (0.4 mg total) under the tongue every 5 (five) minutes as needed for chest pain. 25 tablet 3   oxyCODONE-acetaminophen (PERCOCET) 10-325 MG tablet Take 1 tablet by mouth every 4 (four) hours as needed for pain. 30 tablet 0   pantoprazole (PROTONIX) 40 MG tablet TAKE 1 TABLET (40 MG TOTAL) BY MOUTH DAILY. (Patient taking differently: Take 40 mg by mouth daily.) 30 tablet 6   predniSONE (DELTASONE) 20 MG tablet Take 1 tablet (20 mg total) by mouth daily with breakfast. 5 tablet 0   triamcinolone cream (KENALOG) 0.1 % Apply 1 application topically 2 (two) times daily. 80 g 1   vitamin B-12 (CYANOCOBALAMIN) 1000 MCG tablet Take 1 tablet (1,000 mcg total) by mouth daily. 30 tablet 5   [DISCONTINUED] rosuvastatin (CRESTOR) 5 MG tablet Take 1 tablet (5 mg total) by mouth daily. 30 tablet 6   No current facility-administered medications on file prior to visit.    Allergies  Allergen Reactions   Atorvastatin     myalgia     Assessment/Plan:  1. CHF -  Patient BP in room  133/87 which is above goal of <130/80. Unfortunately patient has not been taking any medications and feels poorly. Discussed importance of medication compliance espeically with Eliquis since patient has history of PE and DVT.  Patient in need of social work assistance.  Brought in LCSW to help patient with transportation issues and  was able to set patient up with ride to pharmacy tomorrow and for follow up in 2 weeks.  Sent in refills of Eliquis, metoprolol, isosorbide, and furosemide to [facility]. Advised to restart cardiac medications and will recheck in clinic in 2 weeks.  Karren Cobble, PharmD, BCACP, [place], [place], [place], [place], [address] Phone: [phone], Fax: [phone]

## 2021-04-07 NOTE — Progress Notes (Signed)
Heart and Vascular Care Navigation  04/07/2021  [name] 08-17-1960 [id number]  Reason for Referral:  Engaged with patient face to face for initial visit for Heart and Vascular Care Coordination.                                                                                                   Assessment:        LCSW met with pt during pharmacy appt today. Introduced myself and role. Pt shares that he utilizes Medicaid transportation but he has to schedule that at least 3 days before appts and wants to know his other options. Confirmed home address and phone number. He lives at [employer] which is affordable senior housing managed by the [employer]; he utilizes [employer] for PCP and [employer] for medications. He needs to get to pick up medications and to upcoming appointments. LCSW confirmed that he can utilize Medicaid transportation, acknowledged there is sometimes challenges with that. He is also eligible for [facility] Medicare transportation services and [employer]. I shared with him a list of those options as well as my card. Encouraged him to call us if he hits any snags with this.                                HRT/VAS Care Coordination     Patients Home Cardiology Office [place] Team Social Worker   Social Worker Name: Valeda Malm, [facility] [phone]   Living arrangements for the past 2 months Apartment   Lives with: Self   Patient Current Insurance Coverage Medicaid; Managed Medicare   Patient Has Concern With Paying Medical Bills No   Does Patient Have Prescription Coverage? Yes   Home Assistive Devices/Equipment Eyeglasses   DME Agency NA   HH Agency NA       Social History:                                                                             SDOH Screenings   Alcohol Screen: Not on file  Depression (PHQ2-9): Low Risk    PHQ-2 Score: 3  Financial Resource Strain: Not on file   Food Insecurity: Not on file  Housing: [place] Risk Score: 0  Physical Activity: Not on file  Social Connections: Not on file  Stress: Not on file  Tobacco Use: High Risk   Smoking Tobacco Use: Every Day   Smokeless Tobacco Use: Never   Passive Exposure: Not on file  Transportation Needs: Unmet Transportation Needs   Lack of Transportation (Medical): Yes   Lack of Transportation (Non-Medical): Yes    SDOH Interventions: Housing Insecurity:  Housing Interventions: Intervention Not Indicated  Transportation:   Transportation Interventions: [employer], Other (Comment) (Medicaid and [facility] Medicare transportation options also shared)      Provided Pharmacy assistance resources  Connected w. [facility] where he has his PCP   Follow-up plan:   LCSW has scheduled rides for 1/13 to [place] and 1/25 to come back to see pharmacy team at [facility]. Again, encouraged him to reach out if any issues with the above.

## 2021-04-07 NOTE — Patient Instructions (Signed)
It was nice meeting you today  We would like your blood pressure to stay less than 130/80  I refilled your: Metoprolol Furosemide Isosorbide Eliquis 5mg  twice a day  Please restart your medications and I will see you back in 2 weeks  Karren Cobble, PharmD, [place], [place], [place], [place], [place], [address] Phone: [phone], Fax: [phone]

## 2021-04-08 ENCOUNTER — Other Ambulatory Visit: Payer: Self-pay

## 2021-04-08 ENCOUNTER — Telehealth: Payer: Self-pay | Admitting: Licensed Clinical Social Worker

## 2021-04-08 NOTE — Telephone Encounter (Signed)
LCSW received call from pt, shares that he "had something pop up today," and that he needs his ride rescheduled for Monday to get medications. I inquired if pt could still pick up medications later today- he states he cannot and again asks for Monday ride. I shared that we could reschedule ride but when he gets the medications he needs to take them immediately as he has been not taking them for extended period of time. Pt states understanding. Pt ride rescheduled for Monday at 10am to take him to [place] (now located at the old [place])  Westley Hummer, MSW, [place]  [phone]- work cell phone (preferred) [phone]- desk phone

## 2021-04-09 DIAGNOSIS — Z1152 Encounter for screening for COVID-19: Secondary | ICD-10-CM | POA: Diagnosis not present

## 2021-04-11 ENCOUNTER — Other Ambulatory Visit: Payer: Self-pay

## 2021-04-11 ENCOUNTER — Telehealth: Payer: Self-pay | Admitting: Licensed Clinical Social Worker

## 2021-04-11 NOTE — Telephone Encounter (Signed)
Spoke with pt twice this morning. Rides completed through [place] by this Probation officer for pt to get to [place], he was able to get his medications and return home.   Westley Hummer, MSW, [place]  [phone]- work cell phone (preferred) [phone]- desk phone

## 2021-04-13 ENCOUNTER — Other Ambulatory Visit: Payer: Self-pay

## 2021-04-13 ENCOUNTER — Ambulatory Visit: Payer: Medicare Other | Attending: Family Medicine

## 2021-04-13 DIAGNOSIS — R29898 Other symptoms and signs involving the musculoskeletal system: Secondary | ICD-10-CM | POA: Insufficient documentation

## 2021-04-13 DIAGNOSIS — M48061 Spinal stenosis, lumbar region without neurogenic claudication: Secondary | ICD-10-CM | POA: Insufficient documentation

## 2021-04-13 DIAGNOSIS — M5416 Radiculopathy, lumbar region: Secondary | ICD-10-CM | POA: Insufficient documentation

## 2021-04-13 DIAGNOSIS — M545 Low back pain, unspecified: Secondary | ICD-10-CM | POA: Diagnosis not present

## 2021-04-13 DIAGNOSIS — G8929 Other chronic pain: Secondary | ICD-10-CM | POA: Diagnosis not present

## 2021-04-13 DIAGNOSIS — M4327 Fusion of spine, lumbosacral region: Secondary | ICD-10-CM | POA: Diagnosis not present

## 2021-04-13 DIAGNOSIS — M6281 Muscle weakness (generalized): Secondary | ICD-10-CM | POA: Diagnosis not present

## 2021-04-13 NOTE — Therapy (Signed)
OUTPATIENT PHYSICAL THERAPY THORACOLUMBAR EVALUATION   Patient Name: [name] MRN: [id number] DOB:September 28, 1960, 61 y.o., male Today's Date: 04/13/2021   PT End of Session - 04/13/21 1449     Visit Number 1    Number of Visits 8    Date for PT Re-Evaluation 05/11/21    PT Start Time 4580    PT Stop Time 1530    PT Time Calculation (min) 45 min    Activity Tolerance Patient tolerated treatment well    Behavior During Therapy [facility] for tasks assessed/performed             Past Medical History:  Diagnosis Date   Asthma    CAD (coronary artery disease)    a. 09/2013 NSTEMI/PCI: LM nl, LAD 40-50%, D1 100 (2.25 x 28 Vision BMS), LCX min irregs, RI 60-70, 30, RCA 40-50/50-67ms/p, EF 45-50%.  b. cath 12/2014 -occulded BMS in diag, 50% pro to mid LAD, 60% ramus, 30% RCA    Chest pain 08/2015   Chronic diastolic CHF (congestive heart failure) ([place])    a. 09/2014 EF 45-50% by LV gram;  b. 01/2014 Echo: EF 55-60%, Gr 1 DD.   Cocaine abuse ([place])    COPD (chronic obstructive pulmonary disease) ([place])    DVT (deep venous thrombosis) ([place])    "I had ~ 10 in each leg" (12/20/2016)   Essential hypertension    Hepatitis C    "clear free over a year now"   High cholesterol    Myocardial infarction [facility]) ~ 2014   Osteoarthritis    a. hands and toes.   Pneumonia    "several times" (12/20/2016)   Pulmonary embolism ([place])    a. 2012 - s/p IVC filter;  b. prev on eliquis - noncompliant.   Shortness of breath dyspnea    Sinus headache    Squamous cell skin cancer 07/13/2016   "foot"   Tobacco abuse    Past Surgical History:  Procedure Laterality Date   CARDIAC CATHETERIZATION N/A 01/04/2015   Procedure: Left Heart Cath and Coronary Angiography;  Surgeon: Burnell Blanks, MD;  Location: [facility] CV [facility];  Service: Cardiovascular;  Laterality: N/A;   CORONARY ANGIOPLASTY WITH STENT PLACEMENT  10/21/13   BMS to D1   CYST EXCISION N/A 05/10/2016   Procedure: EXCISION OF SEBACEOUS  CYST UPPER BACK;  Surgeon: Donnie Mesa, MD;  Location: [facility];  Service: General;  Laterality: N/A;   FOOT SURGERY Right ~ 06/2016   "said there was skin cancer on it"   LEFT HEART CATHETERIZATION WITH CORONARY ANGIOGRAM N/A 10/21/2013   Procedure: LEFT HEART CATHETERIZATION WITH CORONARY ANGIOGRAM;  Surgeon: Leonie Man, MD;  Location: [facility] CATH LAB;  Service: Cardiovascular;  Laterality: N/A;   VENA CAVA FILTER PLACEMENT  ~ 2007-~ 2017   "1 in my neck; 2 in my wrist"   Patient Active Problem List   Diagnosis Date Noted   Degenerative spondylolisthesis 11/02/2020   Statin myopathy 08/17/2020   Lumbar radiculopathy 05/20/2020   Gait abnormality 03/12/2020   Chronic right-sided low back pain with right-sided sciatica 03/12/2020   Weakness 03/12/2020   Pain due to onychomycosis of toenails of both feet 01/30/2020   Hyperlipidemia 01/26/2020   Paraparesis ([place]) 08/21/2019   GERD (gastroesophageal reflux disease) 04/24/2017   COPD exacerbation ([place]) 04/05/2017   Chronic sinusitis 02/02/2017   COPD with acute exacerbation ([place]) 12/20/2016   History of squamous cell carcinoma 08/03/2016   Psoriasis 08/03/2016   Squamous cell skin cancer  07/13/2016   Chronic anticoagulation 02/01/2016   Malnutrition of moderate degree 06/05/2015   Liver fibrosis 04/08/2015   COPD (chronic obstructive pulmonary disease) ([place]) 01/01/2015   Essential hypertension 01/01/2015   Unstable angina ([place]) 01/01/2015   History of coronary artery disease    Cocaine abuse ([place]) 12/01/2014   Pleuritic chest pain 12/01/2014   Chronic diastolic heart failure ([place]) 12/01/2014   QT prolongation 12/01/2014   Chronic hepatitis C without hepatic coma ([place]) 06/18/2014   HTN (hypertension) 04/27/2014   Coronary artery disease involving native coronary artery of native heart without angina pectoris    CAD (coronary artery disease) 10/21/2013   NSTEMI (non-ST elevated myocardial infarction) ([place]) 10/21/2013   Anemia  06/20/2013   Regular alcohol consumption 06/18/2013   History of DVT (deep vein thrombosis) 02/21/2013   Thrombocytopenia ([place]) 08/09/2012   Tobacco abuse 08/09/2012   History of noncompliance with medical treatment 03/24/2012   Atopic dermatitis 03/24/2012   History of pulmonary embolism 02/10/2012    PCP: Charlott Rakes, MD  REFERRING PROVIDER: Charlott Rakes, MD  REFERRING DIAG: M48.061,M54.16 (ICD-10-CM) - Spinal stenosis of lumbar region with radiculopathy R29.898 (ICD-10-CM) - Weakness of both lower extremities   THERAPY DIAG:  1. Gait abnormality 2. Chronic low back pain     ONSET DATE: -Post lumbar decompression and fusion surgery in August 2022 with Dr. Annette Stable, claims continued symptoms of leg weakness when standing  SUBJECTIVE:                                                                                                                                                                                           SUBJECTIVE STATEMENT: Reports a progressive decline in BLE strength which is responsible for at least 2 falls and a decrease in his functional status.  He requires heavy UE support to arise from sitting and avoids steps and stairs  PERTINENT HISTORY:  -Post lumbar decompression and fusion surgery in August 2022 with Dr. Annette Stable, claims continued symptoms of leg weakness when standing -However, he did not complete physical therapy of his own choosing, he is now interested but has cancelled his last appointments, encouraged him to schedule this, get started in physical therapy, hopefully will help his symptoms -Is seeing Dr. Annette Stable January 11th for follow-up, encouraged to keep this -Follow-up here in 6 months or sooner if needed, going forward determine if needs to continue to follow him, would like to see his baseline after he finishes therapy -MRI of cervical spine showed moderate C3-4 spinal canal stenosis, multilevel lumbar degenerative changes, L5-S1 with severe right,  moderate left foraminal narrowing-early cord compression at C3-4  PAIN:  Are you having pain? Yes NPRS scale: 8/10 Pain location: Low back and legs Pain orientation: Bilateral  PAIN TYPE: aching Pain description: intermittent and dull  Aggravating factors: prolonged sitting and standing Relieving factors: supine   PRECAUTIONS: Fall  WEIGHT BEARING RESTRICTIONS No  FALLS:  Has patient fallen in last 6 months? Yes, Number of falls: 2  LIVING ENVIRONMENT: Lives with: lives alone Lives in: House/apartment Stairs: No;    OCCUPATION: retired  PLOF: Independent with basic ADLs, Independent with gait, and Independent with transfers  PATIENT GOALS: Regain my leg sterngth and manage my symptoms   OBJECTIVE:   DIAGNOSTIC FINDINGS:  FINDINGS: AP and lateral view intraoperative fluoroscopic images of the lumbosacral spine are submitted, 2 images total. The lowest well-formed intervertebral disc space is designated L5-S1. On the provided images, bilateral pedicle screws are present at L5 and S1. Vertical interconnecting rods were not present at the time the images were taken. Interbody device(s) present at L5-S1. Overlying retractors.   IMPRESSION: Two intraoperative fluoroscopic images of the lumbar spine from L5-S1 posterior fusion, as described.  PATIENT SURVEYS:  FOTO 47  SCREENING FOR RED FLAGS: Bowel or bladder incontinence: No  COGNITION:  Overall cognitive status: Within functional limits for tasks assessed     SENSATION:  Light touch: Appears intact    MUSCLE LENGTH: Hamstrings: Right 90 deg; Left 90 deg  POSTURE:  Flexed hips and lumbar, rounded shoulders  PALPATION: unremarkable  LUMBARAROM/PROM: Not tested at eval as patient unable to stand unsupported   A/PROM A/PROM  04/13/2021  Flexion   Extension   Right lateral flexion   Left lateral flexion   Right rotation   Left rotation      LE AROM/PROM: WNL throughout  A/PROM Right 04/13/2021  Left 04/13/2021  Hip flexion    Hip extension    Hip abduction    Hip adduction    Hip internal rotation    Hip external rotation    Knee flexion    Knee extension    Ankle dorsiflexion    Ankle plantarflexion    Ankle inversion    Ankle eversion     (Blank rows = not tested)  LE MMT:  MMT Right 04/13/2021 Left 04/13/2021  Hip flexion 4 4  Hip extension    Hip abduction 3+ 3+  Hip adduction    Hip internal rotation    Hip external rotation    Knee flexion    Knee extension 3+ 3+  Ankle dorsiflexion    Ankle plantarflexion 3+ 3+  Ankle inversion    Ankle eversion     (Blank rows = not tested)  LUMBAR SPECIAL TESTS:  Straight leg raise test: Negative, Slump test: Negative, and FABER test: Negative  FUNCTIONAL TESTS:  5x STS 26s with UE support Gait speed 2.5.ft/s  GAIT: Distance walked: 59ft x2 Assistive device utilized: None Level of assistance: Modified independence Comments: slow cadence    TODAY'S TREATMENT  Evaluation and HEP   PATIENT EDUCATION:  Education details: Discussed eval findings, rehab rationale and POC and patient is in agreement  Person educated: Patient Education method: Explanation, Corporate treasurer cues, Verbal cues, and Handouts Education comprehension: verbalized understanding, returned demonstration, verbal cues required, and needs further education   HOME EXERCISE PROGRAM: Access Code: [email or web address] URL: [email or web address] Date: 04/13/2021 Prepared by: Sharlynn Oliphant  Exercises Supine Bridge - 2 x daily - 7 x weekly - 2 sets - 10 reps Sidelying Hip Abduction - 2 x daily - 7 x weekly - 2  sets - 10 reps Seated Long Arc Quad - 2 x daily - 7 x weekly - 2 sets - 10 reps Standing Heel Raise with Support - 2 x daily - 7 x weekly - 2 sets - 10 reps   ASSESSMENT:  CLINICAL IMPRESSION: Patient is a 61 y.o. male who was seen today for physical therapy evaluation and treatment for LE weakness and functional decline following PLIF  L5-S1 in 8/22.  Patient demos good LE flexibility and no neurologic impairments, 5x STS and gait speed below functional limits, UTA lumbar mobility as patient unable to stand unsupported comfortably Objective impairments include Abnormal gait, decreased activity tolerance, decreased balance, decreased mobility, difficulty walking, decreased strength, and postural dysfunction. These impairments are limiting patient from community activity, meal prep, yard work, and stair climbing . Personal factors including Age, Fitness, and Time since onset of injury/illness/exacerbation are also affecting patient's functional outcome. Patient will benefit from skilled PT to address above impairments and improve overall function.  REHAB POTENTIAL: Good  CLINICAL DECISION MAKING: Stable/uncomplicated  EVALUATION COMPLEXITY: Low   GOALS: Goals reviewed with patient? Yes  SHORT TERM GOALS:  STG Name Target Date Goal status  1 Patient to demonstrate independence in HEP  Baseline: [place] 04/27/2021 INITIAL  2 Decrease 5x STS score to 20s Baseline: 26s with UE Support 04/27/2021 INITIAL  LONG TERM GOALS:   LTG Name Target Date Goal status  1 Increase B knee, hip and ankle strength to 4/5 Baseline: B knee, hip and ankle strength 3+/5 05/11/2021 INITIAL  2 Increase gait speed to 3.0 ft/s Baseline: 2.5 ft/s 05/11/2021 INITIAL  3 Increase FOTO score to 53 Baseline: Initial FOTO score 47 05/11/2021 INITIAL  PLAN: PT FREQUENCY: 2x/week  PT DURATION: 4 weeks  PLANNED INTERVENTIONS: Therapeutic exercises, Therapeutic activity, Neuro Muscular re-education, Balance training, Gait training, Patient/Family education, Joint mobilization, Stair training, and Aquatic Therapy  PLAN FOR NEXT SESSION: HEP update, LE strengthening and aerobic conditioning as tolerated, consider aquatics based on response to land based sessions   Lanice Shirts PT 04/13/2021, 3:50 PM

## 2021-04-14 DIAGNOSIS — Z1152 Encounter for screening for COVID-19: Secondary | ICD-10-CM | POA: Diagnosis not present

## 2021-04-20 ENCOUNTER — Telehealth: Payer: Self-pay | Admitting: Licensed Clinical Social Worker

## 2021-04-20 ENCOUNTER — Ambulatory Visit: Payer: Commercial Managed Care - HMO

## 2021-04-20 ENCOUNTER — Other Ambulatory Visit: Payer: Self-pay

## 2021-04-20 ENCOUNTER — Ambulatory Visit: Payer: Medicare Other | Attending: Family Medicine | Admitting: Family Medicine

## 2021-04-20 ENCOUNTER — Encounter: Payer: Self-pay | Admitting: Family Medicine

## 2021-04-20 DIAGNOSIS — I10 Essential (primary) hypertension: Secondary | ICD-10-CM

## 2021-04-20 DIAGNOSIS — J438 Other emphysema: Secondary | ICD-10-CM

## 2021-04-20 DIAGNOSIS — I5042 Chronic combined systolic (congestive) and diastolic (congestive) heart failure: Secondary | ICD-10-CM | POA: Diagnosis not present

## 2021-04-20 DIAGNOSIS — Z86711 Personal history of pulmonary embolism: Secondary | ICD-10-CM | POA: Diagnosis not present

## 2021-04-20 DIAGNOSIS — R29898 Other symptoms and signs involving the musculoskeletal system: Secondary | ICD-10-CM | POA: Diagnosis not present

## 2021-04-20 NOTE — Progress Notes (Signed)
Virtual Visit via Telephone Note  I connected with [name], on 04/20/2021 at 2:13 PM by telephone due to the COVID-19 pandemic and verified that I am speaking with the correct person using two identifiers.   Consent: I discussed the limitations, risks, security and privacy concerns of performing an evaluation and management service by telephone and the availability of in person appointments. I also discussed with the patient that there may be a patient responsible charge related to this service. The patient expressed understanding and agreed to proceed.   Location of Patient: Home  Location of Provider: [place] participating in Telemedicine visit: [name] Dr. Margarita [name]     History of Present Illness: [name] is a 61 y.o. year old male with a history of COPD, chronic sinusitis, recurrent DVT and PE (on anticoagulation with Eliquis), coronary artery disease (status post stent, last cardiac cath from 12/2014 revealed two-vessel CAD, medical management recommended),  (EF 40-45% from 01/2021),tobacco abuse, status post posterior lumbar interbody fusion in 10/2020.    His legs have been hurting and he is currently undergoing PT.He sometimes uses a cane to ambulate but he has difficulty rising from a seated position. He fell a few weeks ago at the bus stop Last seen by Dr [name] , [facility] 2 weeks ago and was advised to use Advil. States he will no longer prescribe Oxycodone for him. He also had a visit with [facility] earlier in the month with no change in management.  Whenever he bends to pick up something he gets lightheaded His breathing is stable with no exacerbation.  Requests a refill of his Trelegy.  Compliant with his antihypertensive and blood pressure at his visit with neurology was 144/98 three weeks ago.  Denies presence of chest pain, dyspnea.  He missed his appointment with cardiology today due to bad  weather. He has no additional concerns besides his gait. Past Medical History:  Diagnosis Date   Asthma    CAD (coronary artery disease)    a. 09/2013 NSTEMI/PCI: LM nl, LAD 40-50%, D1 100 (2.25 x 28 Vision BMS), LCX min irregs, RI 60-70, 30, RCA 40-50/50-14ms/p, EF 45-50%.  b. cath 12/2014 -occulded BMS in diag, 50% pro to mid LAD, 60% ramus, 30% RCA    Chest pain 08/2015   Chronic diastolic CHF (congestive heart failure) ([place])    a. 09/2014 EF 45-50% by LV gram;  b. 01/2014 Echo: EF 55-60%, Gr 1 DD.   Cocaine abuse ([name])    COPD (chronic obstructive pulmonary disease) ([place])    DVT (deep venous thrombosis) ([place])    "I had ~ 10 in each leg" (12/20/2016)   Essential hypertension    Hepatitis C    "clear free over a year now"   High cholesterol    Myocardial infarction [facility]) ~ 2014   Osteoarthritis    a. hands and toes.   Pneumonia    "several times" (12/20/2016)   Pulmonary embolism ([place])    a. 2012 - s/p IVC filter;  b. prev on eliquis - noncompliant.   Shortness of breath dyspnea    Sinus headache    Squamous cell skin cancer 07/13/2016   "foot"   Tobacco abuse    Allergies  Allergen Reactions   Atorvastatin     myalgia    Current Outpatient Medications on File Prior to Visit  Medication Sig Dispense Refill   albuterol (PROAIR HFA) 108 (90 Base) MCG/ACT inhaler Inhale 2  puffs into the lungs every 6 (six) hours as needed for wheezing or shortness of breath. 18 g 6   albuterol (PROVENTIL) (2.5 MG/3ML) 0.083% nebulizer solution Take 3 mLs (2.5 mg total) by nebulization every 6 (six) hours as needed for wheezing or shortness of breath. 75 mL 3   apixaban (ELIQUIS) 5 MG TABS tablet TAKE 1 TABLET (5 MG TOTAL) BY MOUTH 2 (TWO) TIMES DAILY. 180 tablet 1   budesonide-formoterol (SYMBICORT) 160-4.5 MCG/ACT inhaler INHALE 2 PUFFS INTO THE LUNGS 2 (TWO) TIMES DAILY. 10.2 g 6   cetirizine (ZYRTEC) 10 MG tablet Take 1 tablet (10 mg total) by mouth daily. (Patient taking differently: Take 10  mg by mouth daily as needed for allergies.) 30 tablet 3   Evolocumab 140 MG/ML SOAJ INJECT 140 MG INTO THE SKIN EVERY 14 (FOURTEEN) DAYS. 6 mL 3   ezetimibe (ZETIA) 10 MG tablet TAKE 1 TABLET (10 MG TOTAL) BY MOUTH DAILY. (Patient taking differently: Take 10 mg by mouth daily.) 90 tablet 3   fluticasone (FLONASE) 50 MCG/ACT nasal spray PLACE 2 SPRAYS INTO BOTH NOSTRILS 2 (TWO) TIMES DAILY. 16 g 2   Fluticasone-Umeclidin-Vilant 100-62.5-25 MCG/INH AEPB INHALE 1 INHALER INTO THE LUNGS DAILY. (Patient taking differently: Inhale 1 puff into the lungs daily.) 60 each 6   furosemide (LASIX) 40 MG tablet Take 1 tablet (40 mg total) by mouth daily as needed (May take 40mg  Daily for Swelling and Shortness of breath). 90 tablet 1   gabapentin (NEURONTIN) 300 MG capsule Take 1 capsule (300 mg total) by mouth 2 (two) times daily. 60 capsule 6   isosorbide mononitrate (IMDUR) 30 MG 24 hr tablet Take 1 tablet (30 mg total) by mouth daily. 90 tablet 1   methocarbamol (ROBAXIN) 500 MG tablet Take 1 tablet (500 mg total) by mouth every 8 (eight) hours as needed for muscle spasms. 90 tablet 1   metoprolol succinate (TOPROL-XL) 25 MG 24 hr tablet Take 1 tablet (25 mg total) by mouth daily. 90 tablet 1   nitroGLYCERIN (NITROSTAT) 0.4 MG SL tablet Place 1 tablet (0.4 mg total) under the tongue every 5 (five) minutes as needed for chest pain. 25 tablet 3   oxyCODONE-acetaminophen (PERCOCET) 10-325 MG tablet Take 1 tablet by mouth every 4 (four) hours as needed for pain. (Patient not taking: Reported on 04/07/2021) 30 tablet 0   pantoprazole (PROTONIX) 40 MG tablet TAKE 1 TABLET (40 MG TOTAL) BY MOUTH DAILY. 30 tablet 6   predniSONE (DELTASONE) 20 MG tablet Take 1 tablet (20 mg total) by mouth daily with breakfast. (Patient not taking: Reported on 04/07/2021) 5 tablet 0   triamcinolone cream (KENALOG) 0.1 % Apply 1 application topically 2 (two) times daily. 80 g 1   vitamin B-12 (CYANOCOBALAMIN) 1000 MCG tablet Take 1 tablet  (1,000 mcg total) by mouth daily. 30 tablet 5   [DISCONTINUED] rosuvastatin (CRESTOR) 5 MG tablet Take 1 tablet (5 mg total) by mouth daily. 30 tablet 6   No current facility-administered medications on file prior to visit.    ROS: See HPI  Observations/Objective: Awake, alert, oriented x3 Not in acute distress Normal mood   CMP Latest Ref Rng & Units 11/02/2020 08/13/2020 05/04/2020  Glucose 70 - 99 mg/dL 114(H) - -  BUN 6 - 20 mg/dL 10 - -  Creatinine 0.61 - 1.24 mg/dL 0.76 - -  Sodium 135 - 145 mmol/L 136 - -  Potassium 3.5 - 5.1 mmol/L 3.5 - -  Chloride 98 - 111 mmol/L 106 - -  CO2 22 - 32 mmol/L 21(L) - -  Calcium 8.9 - 10.3 mg/dL 9.1 - -  Total Protein 6.0 - 8.5 g/dL - 7.8 7.0  Total Bilirubin 0.0 - 1.2 mg/dL - <0.2 0.5  Alkaline Phos 44 - 121 IU/L - 122(H) 122(H)  AST 0 - 40 IU/L - 18 22  ALT 0 - 44 IU/L - 25 19    Lipid Panel     Component Value Date/Time   CHOL 168 08/13/2020 1105   TRIG 104 08/13/2020 1105   HDL 69 08/13/2020 1105   CHOLHDL 2.4 08/13/2020 1105   CHOLHDL 3.1 01/01/2015 0208   VLDL 12 01/01/2015 0208   LDLCALC 80 08/13/2020 1105   LABVLDL 19 08/13/2020 1105    Lab Results  Component Value Date   HGBA1C 5.9 (H) 08/09/2020    Assessment and Plan: 1. Other emphysema ([place]) Controlled with no exacerbations Continue Trelegy, albuterol MDI as needed Smoking cessation will be beneficial  2. Weakness of both lower extremities Status post lumbar decompression and fusion surgery by Dr. Trenton [name] He continues to fall and has an unstable gait Fall precautions discussed Recently started PT which might be beneficial Continue to follow-up with neurosurgeon and neurology  3. Essential hypertension Slightly above goal from recent office visit Will need to be evaluated in person visit and adjust regimen if indicated Counseled on blood pressure goal of less than 130/80, low-sodium, DASH diet, medication compliance, 150 minutes of moderate intensity  exercise per week. Discussed medication compliance, adverse effects.   4. Chronic combined systolic and diastolic congestive heart failure (HCC) EF of 40 to 45% from echo of 01/2021 Euvolemic Continue isosorbide, beta-blocker, furosemide Consider SGLT2 inhibitor initiation at next visit 20 on Repatha due to statin intolerance  5. History of PE and DVT Currently on Eliquis  Follow Up instructions: 3 months   I discussed the assessment and treatment plan with the patient. The patient was provided an opportunity to ask questions and all were answered. The patient agreed with the plan and demonstrated an understanding of the instructions.   The patient was advised to call back or seek an in-person evaluation if the symptoms worsen or if the condition fails to improve as anticipated.     I provided 13 minutes total of non-face-to-face time during this encounter.   Charlott Rakes, MD, FAAFP. [facility], [place]   04/20/2021, 2:13 PM

## 2021-04-20 NOTE — Telephone Encounter (Signed)
CSW consulted to assist with transportation to Pharmacist appt on 2/24- ride set up through [employer]  Will continue to follow and assist as needed  Jorge Ny, [facility] Desk#: [phone] Cell#: [phone]

## 2021-04-20 NOTE — Telephone Encounter (Signed)
This encounter was created in error - please disregard.

## 2021-04-21 DIAGNOSIS — Z1152 Encounter for screening for COVID-19: Secondary | ICD-10-CM | POA: Diagnosis not present

## 2021-04-22 ENCOUNTER — Ambulatory Visit: Payer: Medicare Other

## 2021-04-22 ENCOUNTER — Other Ambulatory Visit (HOSPITAL_COMMUNITY): Payer: Self-pay

## 2021-04-22 ENCOUNTER — Other Ambulatory Visit: Payer: Self-pay

## 2021-04-22 DIAGNOSIS — G8929 Other chronic pain: Secondary | ICD-10-CM

## 2021-04-22 DIAGNOSIS — M5416 Radiculopathy, lumbar region: Secondary | ICD-10-CM | POA: Diagnosis not present

## 2021-04-22 DIAGNOSIS — M6281 Muscle weakness (generalized): Secondary | ICD-10-CM

## 2021-04-22 DIAGNOSIS — M4327 Fusion of spine, lumbosacral region: Secondary | ICD-10-CM | POA: Diagnosis not present

## 2021-04-22 DIAGNOSIS — M545 Low back pain, unspecified: Secondary | ICD-10-CM

## 2021-04-22 DIAGNOSIS — M48061 Spinal stenosis, lumbar region without neurogenic claudication: Secondary | ICD-10-CM | POA: Diagnosis not present

## 2021-04-22 DIAGNOSIS — R29898 Other symptoms and signs involving the musculoskeletal system: Secondary | ICD-10-CM | POA: Diagnosis not present

## 2021-04-22 NOTE — Therapy (Addendum)
OUTPATIENT PHYSICAL THERAPY TREATMENT NOTE/DC SUMMARY   Patient Name: [name] MRN: [id number] DOB:12/28/1960, 61 y.o., male Today's Date: 04/22/2021  PCP: Charlott Rakes, MD REFERRING PROVIDER: Charlott Rakes, MD  PHYSICAL THERAPY DISCHARGE SUMMARY  Visits from Start of Care: 2  Current functional level related to goals / functional outcomes: UTA   Remaining deficits: UTA   Education / Equipment: HEP   Patient agrees to discharge. Patient goals were partially met. Patient is being discharged due to not returning since the last visit.    PT End of Session - 04/22/21 1218     Visit Number 2    Number of Visits 8    Date for PT Re-Evaluation 05/11/21    Authorization Type UHC MCR    Progress Note Due on Visit 8    PT Start Time 1215    PT Stop Time 1300    PT Time Calculation (min) 45 min    Activity Tolerance Patient tolerated treatment well    Behavior During Therapy WFL for tasks assessed/performed             Past Medical History:  Diagnosis Date   Asthma    CAD (coronary artery disease)    a. 09/2013 NSTEMI/PCI: LM nl, LAD 40-50%, D1 100 (2.25 x 28 Vision BMS), LCX min irregs, RI 60-70, 30, RCA 40-50/50-54ms/p, EF 45-50%.  b. cath 12/2014 -occulded BMS in diag, 50% pro to mid LAD, 60% ramus, 30% RCA    Chest pain 08/2015   Chronic diastolic CHF (congestive heart failure) ([place])    a. 09/2014 EF 45-50% by LV gram;  b. 01/2014 Echo: EF 55-60%, Gr 1 DD.   Cocaine abuse ([place])    COPD (chronic obstructive pulmonary disease) ([place])    DVT (deep venous thrombosis) ([place])    "I had ~ 10 in each leg" (12/20/2016)   Essential hypertension    Hepatitis C    "clear free over a year now"   High cholesterol    Myocardial infarction [facility]) ~ 2014   Osteoarthritis    a. hands and toes.   Pneumonia    "several times" (12/20/2016)   Pulmonary embolism ([place])    a. 2012 - s/p IVC filter;  b. prev on eliquis - noncompliant.   Shortness of breath dyspnea    Sinus  headache    Squamous cell skin cancer 07/13/2016   "foot"   Tobacco abuse    Past Surgical History:  Procedure Laterality Date   CARDIAC CATHETERIZATION N/A 01/04/2015   Procedure: Left Heart Cath and Coronary Angiography;  Surgeon: Burnell Blanks, MD;  Location: [place] CV LAB;  Service: Cardiovascular;  Laterality: N/A;   CORONARY ANGIOPLASTY WITH STENT PLACEMENT  10/21/13   BMS to D1   CYST EXCISION N/A 05/10/2016   Procedure: EXCISION OF SEBACEOUS CYST UPPER BACK;  Surgeon: Donnie Mesa, MD;  Location: [place];  Service: General;  Laterality: N/A;   FOOT SURGERY Right ~ 06/2016   "said there was skin cancer on it"   LEFT HEART CATHETERIZATION WITH CORONARY ANGIOGRAM N/A 10/21/2013   Procedure: LEFT HEART CATHETERIZATION WITH CORONARY ANGIOGRAM;  Surgeon: Leonie Man, MD;  Location: [facility] CATH LAB;  Service: Cardiovascular;  Laterality: N/A;   VENA CAVA FILTER PLACEMENT  ~ 2007-~ 2017   "1 in my neck; 2 in my wrist"   Patient Active Problem List   Diagnosis Date Noted   Degenerative spondylolisthesis 11/02/2020   Statin myopathy 08/17/2020   Lumbar radiculopathy 05/20/2020  Gait abnormality 03/12/2020   Chronic right-sided low back pain with right-sided sciatica 03/12/2020   Weakness 03/12/2020   Pain due to onychomycosis of toenails of both feet 01/30/2020   Hyperlipidemia 01/26/2020   Paraparesis ([place]) 08/21/2019   GERD (gastroesophageal reflux disease) 04/24/2017   COPD exacerbation ([place]) 04/05/2017   Chronic sinusitis 02/02/2017   COPD with acute exacerbation ([place]) 12/20/2016   History of squamous cell carcinoma 08/03/2016   Psoriasis 08/03/2016   Squamous cell skin cancer 07/13/2016   Chronic anticoagulation 02/01/2016   Malnutrition of moderate degree 06/05/2015   Liver fibrosis 04/08/2015   COPD (chronic obstructive pulmonary disease) ([place]) 01/01/2015   Essential hypertension 01/01/2015   Unstable angina ([place]) 01/01/2015   History of coronary artery  disease    Cocaine abuse ([place]) 12/01/2014   Pleuritic chest pain 12/01/2014   Chronic diastolic heart failure ([place]) 12/01/2014   QT prolongation 12/01/2014   Chronic hepatitis C without hepatic coma ([place]) 06/18/2014   HTN (hypertension) 04/27/2014   Coronary artery disease involving native coronary artery of native heart without angina pectoris    CAD (coronary artery disease) 10/21/2013   NSTEMI (non-ST elevated myocardial infarction) ([place]) 10/21/2013   Anemia 06/20/2013   Regular alcohol consumption 06/18/2013   History of DVT (deep vein thrombosis) 02/21/2013   Thrombocytopenia ([place]) 08/09/2012   Tobacco abuse 08/09/2012   History of noncompliance with medical treatment 03/24/2012   Atopic dermatitis 03/24/2012   History of pulmonary embolism 02/10/2012    REFERRING DIAG: M48.061,M54.16 (ICD-10-CM) - Spinal stenosis of lumbar region with radiculopathy R29.898 (ICD-10-CM) - Weakness of both lower extremities   THERAPY DIAG:  Muscle weakness (generalized)  Chronic midline low back pain without sciatica  Fusion of lumbosacral spine  PERTINENT HISTORY: 1. Gait abnormality 2. Chronic low back pain   -Post lumbar decompression and fusion surgery in August 2022 with Dr. Annette Stable, claims continued symptoms of leg weakness when standing -However, he did not complete physical therapy of his own choosing, he is now interested but has cancelled his last appointments, encouraged him to schedule this, get started in physical therapy, hopefully will help his symptoms -Is seeing Dr. Annette Stable January 11th for follow-up, encouraged to keep this -Follow-up here in 6 months or sooner if needed, going forward determine if needs to continue to follow him, would like to see his baseline after he finishes therapy -MRI of cervical spine showed moderate C3-4 spinal canal stenosis, multilevel lumbar degenerative changes, L5-S1 with severe right, moderate left foraminal narrowing-early cord compression at  C3-4  PRECAUTIONS: fusion  SUBJECTIVE: Maybe moving a little better, still cannot rise from low seat w/o difficulty  PAIN:  Are you having pain? Yes NPRS scale: 2/10 Pain location: low back Pain orientation: Bilateral  PAIN TYPE: aching Pain description: intermittent  Aggravating factors: arising low chairs Relieving factors: standing     OBJECTIVE:    DIAGNOSTIC FINDINGS:  FINDINGS: AP and lateral view intraoperative fluoroscopic images of the lumbosacral spine are submitted, 2 images total. The lowest well-formed intervertebral disc space is designated L5-S1. On the provided images, bilateral pedicle screws are present at L5 and S1. Vertical interconnecting rods were not present at the time the images were taken. Interbody device(s) present at L5-S1. Overlying retractors.   IMPRESSION: Two intraoperative fluoroscopic images of the lumbar spine from L5-S1 posterior fusion, as described.   PATIENT SURVEYS:  FOTO 43   SCREENING FOR RED FLAGS: Bowel or bladder incontinence: No   COGNITION:  Overall cognitive status: Within functional limits for tasks assessed                        SENSATION:          Light touch: Appears intact             MUSCLE LENGTH: Hamstrings: Right 90 deg; Left 90 deg   POSTURE:  Flexed hips and lumbar, rounded shoulders   PALPATION: unremarkable   LUMBARAROM/PROM: Not tested at eval as patient unable to stand unsupported    A/PROM A/PROM  04/13/2021  Flexion    Extension    Right lateral flexion    Left lateral flexion    Right rotation    Left rotation        LE AROM/PROM: WNL throughout   A/PROM Right 04/13/2021 Left 04/13/2021  Hip flexion      Hip extension      Hip abduction      Hip adduction      Hip internal rotation      Hip external rotation      Knee flexion      Knee extension      Ankle dorsiflexion      Ankle plantarflexion      Ankle inversion      Ankle eversion       (Blank rows = not  tested)   LE MMT:   MMT Right 04/13/2021 Left 04/13/2021  Hip flexion 4 4  Hip extension      Hip abduction 3+ 3+  Hip adduction      Hip internal rotation      Hip external rotation      Knee flexion      Knee extension 3+ 3+  Ankle dorsiflexion      Ankle plantarflexion 3+ 3+  Ankle inversion      Ankle eversion       (Blank rows = not tested)   LUMBAR SPECIAL TESTS:  Straight leg raise test: Negative, Slump test: Negative, and FABER test: Negative   FUNCTIONAL TESTS:  5x STS 26s with UE support Gait speed 2.5.ft/s   GAIT: Distance walked: 17f x2 Assistive device utilized: None Level of assistance: Modified independence Comments: slow cadence       TODAY'S TREATMENT   [facility] Adult PT Treatment:                                                DATE: 04/22/21 Therapeutic Exercise: BConstance Hawx15 Supine marching 15x B alternating  Curl ups x15 Alternating UE OH flexion 15x Supine hip fallouts YTB x15 SLR 15x B SAQs 5# 15x SL clams 15x B SL ABD 15x B Nustep seat 13 arms 12 L2 8 min         PATIENT EDUCATION:  Education details: Discussed eval findings, rehab rationale and POC and patient is in agreement  Person educated: Patient Education method: Explanation, TCorporate treasurercues, Verbal cues, and Handouts Education comprehension: verbalized understanding, returned demonstration, verbal cues required, and needs further education     HOME EXERCISE PROGRAM: Access Code: [email or web address] URL: [email or web address] Date: 04/22/2021 Prepared by: JSharlynn Oliphant Exercises Supine Bridge - 2 x daily - 7 x weekly - 2 sets - 10 reps Sidelying Hip Abduction - 2 x daily - 7 x weekly - 2 sets - 10 reps  Seated Long Arc Quad - 2 x daily - 7 x weekly - 2 sets - 10 reps Standing Heel Raise with Support - 2 x daily - 7 x weekly - 2 sets - 10 reps Hooklying Single Leg Bent Knee Fallouts with Resistance - 2 x daily - 7 x weekly - 2 sets - 10 reps      ASSESSMENT:    CLINICAL IMPRESSION: Introduced BLE strengthening mainly in supine position due to low tolerance to standing and inability to arise for low seating due to marked LE weakness.  Able to tolerate all tasks w/o increasing his symptoms, weakest muscle groups were abductors REHAB POTENTIAL: Good   CLINICAL DECISION MAKING: Stable/uncomplicated   EVALUATION COMPLEXITY: Low     GOALS: Goals reviewed with patient? Yes   SHORT TERM GOALS:   STG Name Target Date Goal status  1 Patient to demonstrate independence in HEP  Baseline: [place] 04/27/2021 INITIAL  2 Decrease 5x STS score to 20s Baseline: 26s with UE Support 04/27/2021 INITIAL  LONG TERM GOALS:    LTG Name Target Date Goal status  1 Increase B knee, hip and ankle strength to 4/5 Baseline: B knee, hip and ankle strength 3+/5 05/11/2021 INITIAL  2 Increase gait speed to 3.0 ft/s Baseline: 2.5 ft/s 05/11/2021 INITIAL  3 Increase FOTO score to 53 Baseline: Initial FOTO score 47 05/11/2021 INITIAL  PLAN: PT FREQUENCY: 2x/week   PT DURATION: 4 weeks   PLANNED INTERVENTIONS: Therapeutic exercises, Therapeutic activity, Neuro Muscular re-education, Balance training, Gait training, Patient/Family education, Joint mobilization, Stair training, and Aquatic Therapy   PLAN FOR NEXT SESSION: HEP update, LE strengthening and aerobic conditioning as tolerated, consider aquatics based on response to land based sessions, increase strengthening tasks as able    Lanice Shirts, PT 04/22/2021, 12:20 PM

## 2021-04-25 ENCOUNTER — Telehealth: Payer: Self-pay | Admitting: Physical Therapy

## 2021-04-25 ENCOUNTER — Ambulatory Visit: Payer: Medicare Other | Admitting: Physical Therapy

## 2021-04-25 NOTE — Telephone Encounter (Signed)
Contacted patient regarding no show to appointment today. He reported that he was in too much pain to attend today. Confirmed next appointment date and time.

## 2021-04-27 ENCOUNTER — Encounter: Payer: Self-pay | Admitting: Physical Therapy

## 2021-04-27 ENCOUNTER — Ambulatory Visit: Payer: Medicare Other | Attending: Family Medicine | Admitting: Physical Therapy

## 2021-04-27 ENCOUNTER — Other Ambulatory Visit: Payer: Self-pay

## 2021-04-27 DIAGNOSIS — M545 Low back pain, unspecified: Secondary | ICD-10-CM | POA: Diagnosis not present

## 2021-04-27 DIAGNOSIS — M4327 Fusion of spine, lumbosacral region: Secondary | ICD-10-CM | POA: Insufficient documentation

## 2021-04-27 DIAGNOSIS — M6281 Muscle weakness (generalized): Secondary | ICD-10-CM | POA: Diagnosis not present

## 2021-04-27 DIAGNOSIS — G8929 Other chronic pain: Secondary | ICD-10-CM | POA: Diagnosis not present

## 2021-04-27 NOTE — Therapy (Signed)
OUTPATIENT PHYSICAL THERAPY TREATMENT NOTE   Patient Name: [name] MRN: [id number] DOB:[birth year], 61 y.o., male Today's Date: 04/27/2021  PCP: Charlott Rakes, MD REFERRING PROVIDER: Charlott Rakes, MD   PT End of Session - 04/27/21 1334     Visit Number 3    Number of Visits 8    Date for PT Re-Evaluation 05/11/21    Authorization Type UHC MCR    PT Start Time 1318    PT Stop Time 1400    PT Time Calculation (min) 42 min             Past Medical History:  Diagnosis Date   Asthma    CAD (coronary artery disease)    a. 09/2013 NSTEMI/PCI: LM nl, LAD 40-50%, D1 100 (2.25 x 28 Vision BMS), LCX min irregs, RI 60-70, 30, RCA 40-50/50-41ms/p, EF 45-50%.  b. cath 12/2014 -occulded BMS in diag, 50% pro to mid LAD, 60% ramus, 30% RCA    Chest pain 08/2015   Chronic diastolic CHF (congestive heart failure) ([place])    a. 09/2014 EF 45-50% by LV gram;  b. 01/2014 Echo: EF 55-60%, Gr 1 DD.   Cocaine abuse ([place])    COPD (chronic obstructive pulmonary disease) ([place])    DVT (deep venous thrombosis) ([place])    "I had ~ 10 in each leg" (12/20/2016)   Essential hypertension    Hepatitis C    "clear free over a year now"   High cholesterol    Myocardial infarction [facility]) ~ 2014   Osteoarthritis    a. hands and toes.   Pneumonia    "several times" (12/20/2016)   Pulmonary embolism ([place])    a. 2012 - s/p IVC filter;  b. prev on eliquis - noncompliant.   Shortness of breath dyspnea    Sinus headache    Squamous cell skin cancer 07/13/2016   "foot"   Tobacco abuse    Past Surgical History:  Procedure Laterality Date   CARDIAC CATHETERIZATION N/A 01/04/2015   Procedure: Left Heart Cath and Coronary Angiography;  Surgeon: Burnell Blanks, MD;  Location: [facility];  Service: Cardiovascular;  Laterality: N/A;   CORONARY ANGIOPLASTY WITH STENT PLACEMENT  10/21/13   BMS to D1   CYST EXCISION N/A 05/10/2016   Procedure: EXCISION OF SEBACEOUS CYST UPPER BACK;  Surgeon:  Donnie Mesa, MD;  Location: [facility];  Service: General;  Laterality: N/A;   FOOT SURGERY Right ~ 06/2016   "said there was skin cancer on it"   LEFT HEART CATHETERIZATION WITH CORONARY ANGIOGRAM N/A 10/21/2013   Procedure: LEFT HEART CATHETERIZATION WITH CORONARY ANGIOGRAM;  Surgeon: Leonie Man, MD;  Location: [facility] CATH LAB;  Service: Cardiovascular;  Laterality: N/A;   VENA CAVA FILTER PLACEMENT  ~ 2007-~ 2017   "1 in my neck; 2 in my wrist"   Patient Active Problem List   Diagnosis Date Noted   Degenerative spondylolisthesis 11/02/2020   Statin myopathy 08/17/2020   Lumbar radiculopathy 05/20/2020   Gait abnormality 03/12/2020   Chronic right-sided low back pain with right-sided sciatica 03/12/2020   Weakness 03/12/2020   Pain due to onychomycosis of toenails of both feet 01/30/2020   Hyperlipidemia 01/26/2020   Paraparesis ([place]) 08/21/2019   GERD (gastroesophageal reflux disease) 04/24/2017   COPD exacerbation ([place]) 04/05/2017   Chronic sinusitis 02/02/2017   COPD with acute exacerbation ([place]) 12/20/2016   History of squamous cell carcinoma 08/03/2016   Psoriasis 08/03/2016   Squamous cell skin cancer 07/13/2016  Chronic anticoagulation 02/01/2016   Malnutrition of moderate degree 06/05/2015   Liver fibrosis 04/08/2015   COPD (chronic obstructive pulmonary disease) ([place]) 01/01/2015   Essential hypertension 01/01/2015   Unstable angina ([place]) 01/01/2015   History of coronary artery disease    Cocaine abuse ([place]) 12/01/2014   Pleuritic chest pain 12/01/2014   Chronic diastolic heart failure ([place]) 12/01/2014   QT prolongation 12/01/2014   Chronic hepatitis C without hepatic coma ([place]) 06/18/2014   HTN (hypertension) 04/27/2014   Coronary artery disease involving native coronary artery of native heart without angina pectoris    CAD (coronary artery disease) 10/21/2013   NSTEMI (non-ST elevated myocardial infarction) ([place]) 10/21/2013   Anemia 06/20/2013   Regular alcohol  consumption 06/18/2013   History of DVT (deep vein thrombosis) 02/21/2013   Thrombocytopenia ([place]) 08/09/2012   Tobacco abuse 08/09/2012   History of noncompliance with medical treatment 03/24/2012   Atopic dermatitis 03/24/2012   History of pulmonary embolism 02/10/2012    REFERRING DIAG: M48.061,M54.16 (ICD-10-CM) - Spinal stenosis of lumbar region with radiculopathy R29.898 (ICD-10-CM) - Weakness of both lower extremities   THERAPY DIAG:  Muscle weakness (generalized)  Chronic midline low back pain without sciatica  Fusion of lumbosacral spine  PERTINENT HISTORY: 1. Gait abnormality 2. Chronic low back pain   -Post lumbar decompression and fusion surgery in August 2022 with Dr. Annette Stable, claims continued symptoms of leg weakness when standing -However, he did not complete physical therapy of his own choosing, he is now interested but has cancelled his last appointments, encouraged him to schedule this, get started in physical therapy, hopefully will help his symptoms -Is seeing Dr. Annette Stable January 11th for follow-up, encouraged to keep this -Follow-up here in 6 months or sooner if needed, going forward determine if needs to continue to follow him, would like to see his baseline after he finishes therapy -MRI of cervical spine showed moderate C3-4 spinal canal stenosis, multilevel lumbar degenerative changes, L5-S1 with severe right, moderate left foraminal narrowing-early cord compression at C3-4  PRECAUTIONS: fusion  SUBJECTIVE: My back was hurting too bad to come to the last visit. I will try not to let that happen again. I still cannot get up from a chair if I sit for awhile.   PAIN:  Are you having pain? Yes NPRS scale: 6/10 Pain location: low back Pain orientation: Bilateral  PAIN TYPE: aching Pain description: intermittent  Aggravating factors: arising low chairs Relieving factors: standing     OBJECTIVE:    DIAGNOSTIC FINDINGS:  FINDINGS: AP and lateral view  intraoperative fluoroscopic images of the lumbosacral spine are submitted, 2 images total. The lowest well-formed intervertebral disc space is designated L5-S1. On the provided images, bilateral pedicle screws are present at L5 and S1. Vertical interconnecting rods were not present at the time the images were taken. Interbody device(s) present at L5-S1. Overlying retractors.   IMPRESSION: Two intraoperative fluoroscopic images of the lumbar spine from L5-S1 posterior fusion, as described.   PATIENT SURVEYS:  FOTO 47   SCREENING FOR RED FLAGS: Bowel or bladder incontinence: No   COGNITION:          Overall cognitive status: Within functional limits for tasks assessed                        SENSATION:          Light touch: Appears intact             MUSCLE LENGTH: Hamstrings: Right  90 deg; Left 90 deg   POSTURE:  Flexed hips and lumbar, rounded shoulders   PALPATION: unremarkable   LUMBARAROM/PROM: Not tested at eval as patient unable to stand unsupported    A/PROM A/PROM  04/13/2021  Flexion    Extension    Right lateral flexion    Left lateral flexion    Right rotation    Left rotation        LE AROM/PROM: WNL throughout   A/PROM Right 04/13/2021 Left 04/13/2021  Hip flexion      Hip extension      Hip abduction      Hip adduction      Hip internal rotation      Hip external rotation      Knee flexion      Knee extension      Ankle dorsiflexion      Ankle plantarflexion      Ankle inversion      Ankle eversion       (Blank rows = not tested)   LE MMT:   MMT Right 04/13/2021 Left 04/13/2021  Hip flexion 4 4  Hip extension      Hip abduction 3+ 3+  Hip adduction      Hip internal rotation      Hip external rotation      Knee flexion      Knee extension 3+ 3+  Ankle dorsiflexion      Ankle plantarflexion 3+ 3+  Ankle inversion      Ankle eversion       (Blank rows = not tested)   LUMBAR SPECIAL TESTS:  Straight leg raise test: Negative,  Slump test: Negative, and FABER test: Negative   FUNCTIONAL TESTS:  5x STS 26s with UE support Gait speed 2.5.ft/s   GAIT: Distance walked: 19ft x2 Assistive device utilized: None Level of assistance: Modified independence Comments: slow cadence       TODAY'S TREATMENT   [facility] Adult PT Treatment:                                                DATE: 04/27/21 Therapeutic Exercise: Nustep L4 x 5 min UE/LE Sit-stand from elevated mat table 2 different heights x10 using UE  to rise, cues for eccentric lowering Bridges x15 Supine clam green band x 15 Supine marching 15x B alternating  SLR 15x B (cues for abdominal draw in)  SAQs 5# 15x alternating SL clams 15x B SL ABD 15x Left (right unable)   [facility] Adult PT Treatment:                                                DATE: 04/22/21 Therapeutic Exercise: Constance Haw x15 Supine marching 15x B alternating  Curl ups x15 Alternating UE OH flexion 15x Supine hip fallouts YTB x15 SLR 15x B SAQs 5# 15x SL clams 15x B SL ABD 15x B Nustep seat 13 arms 12 L2 8 min         PATIENT EDUCATION:  Education details: Continue HEP Person educated: Patient Education method: Explanation, Corporate treasurer cues, Verbal cues, and Handouts Education comprehension: verbalized understanding, returned demonstration, verbal cues required, and needs further education     HOME EXERCISE PROGRAM: Access  Code: [email or web address] URL: [email or web address] Date: 04/22/2021 Prepared by: Sharlynn Oliphant  Exercises Supine Bridge - 2 x daily - 7 x weekly - 2 sets - 10 reps Sidelying Hip Abduction - 2 x daily - 7 x weekly - 2 sets - 10 reps Seated Long Arc Quad - 2 x daily - 7 x weekly - 2 sets - 10 reps Standing Heel Raise with Support - 2 x daily - 7 x weekly - 2 sets - 10 reps Hooklying Single Leg Bent Knee Fallouts with Resistance - 2 x daily - 7 x weekly - 2 sets - 10 reps      ASSESSMENT:   CLINICAL IMPRESSION: Patient reports continued difficulty  with getting out of chairs and was standing in the lobby at start of session. Worked on sit-stand from elevated mat table focuing on eccentric lowering. Continued BLE strengthening mainly in supine position due to low tolerance to standing and inability to arise for low seating due to marked LE weakness.  Discussed Bed Bug findings with patient and to let his housing manager know [employer]). He verbalized understanding.    REHAB POTENTIAL: Good   CLINICAL DECISION MAKING: Stable/uncomplicated   EVALUATION COMPLEXITY: Low     GOALS: Goals reviewed with patient? Yes   SHORT TERM GOALS:   STG Name Target Date Goal status  1 Patient to demonstrate independence in HEP  Baseline: [place]; status 2/123- reports min compliance  04/27/2021 ongoing  2 Decrease 5x STS score to 20s Baseline: 26s with UE Support 04/27/2021 ongoing  LONG TERM GOALS:    LTG Name Target Date Goal status  1 Increase B knee, hip and ankle strength to 4/5 Baseline: B knee, hip and ankle strength 3+/5 05/11/2021 INITIAL  2 Increase gait speed to 3.0 ft/s Baseline: 2.5 ft/s 05/11/2021 INITIAL  3 Increase FOTO score to 53 Baseline: Initial FOTO score 47 05/11/2021 INITIAL  PLAN: PT FREQUENCY: 2x/week   PT DURATION: 4 weeks   PLANNED INTERVENTIONS: Therapeutic exercises, Therapeutic activity, Neuro Muscular re-education, Balance training, Gait training, Patient/Family education, Joint mobilization, Stair training, and Aquatic Therapy   PLAN FOR NEXT SESSION: HEP update, LE strengthening and aerobic conditioning as tolerated, consider aquatics based on response to land based sessions, increase strengthening tasks as able   Hessie Diener, PTA 04/27/21 2:12 PM Phone: [phone] Fax: [phone]

## 2021-04-28 ENCOUNTER — Telehealth: Payer: Self-pay | Admitting: Licensed Clinical Social Worker

## 2021-04-28 NOTE — Telephone Encounter (Signed)
LCSW mailed reminder that pt has a scheduled [facility] [employer] ride to [facility] coumadin appt on 2/24; I have sent pt a reminder that he has benefits both through [facility] and Medicaid for transportation and at this time we are encouraging him to utilize these resources prior to utilizing [employer]. I included my number for any questions/concerns.   Westley Hummer, MSW, [place]  [phone]- work cell phone (preferred) [phone]- desk phone'

## 2021-05-02 ENCOUNTER — Ambulatory Visit: Payer: Medicare Other

## 2021-05-03 ENCOUNTER — Institutional Professional Consult (permissible substitution): Payer: Medicare Other | Admitting: Pulmonary Disease

## 2021-05-03 NOTE — Progress Notes (Deleted)
Subjective:   PATIENT ID: [name] GENDER: male DOB: [birth year], MRN: [id number]   HPI  No chief complaint on file.   Reason for Visit: New consult for ***  *** Social History:  Environmental exposures: ***  I have personally reviewed patient's past medical/family/social history, allergies, current medications.***  Past Medical History:  Diagnosis Date   Asthma    CAD (coronary artery disease)    a. 09/2013 NSTEMI/PCI: LM nl, LAD 40-50%, D1 100 (2.25 x 28 Vision BMS), LCX min irregs, RI 60-70, 30, RCA 40-50/50-62ms/p, EF 45-50%.  b. cath 12/2014 -occulded BMS in diag, 50% pro to mid LAD, 60% ramus, 30% RCA    Chest pain 08/2015   Chronic diastolic CHF (congestive heart failure) ([place])    a. 09/2014 EF 45-50% by LV gram;  b. 01/2014 Echo: EF 55-60%, Gr 1 DD.   Cocaine abuse ([place])    COPD (chronic obstructive pulmonary disease) ([place])    DVT (deep venous thrombosis) ([place])    "I had ~ 10 in each leg" (12/20/2016)   Essential hypertension    Hepatitis C    "clear free over a year now"   High cholesterol    Myocardial infarction [facility]) ~ 2014   Osteoarthritis    a. hands and toes.   Pneumonia    "several times" (12/20/2016)   Pulmonary embolism ([place])    a. 2012 - s/p IVC filter;  b. prev on eliquis - noncompliant.   Shortness of breath dyspnea    Sinus headache    Squamous cell skin cancer 07/13/2016   "foot"   Tobacco abuse      Family History  Problem Relation Age of Onset   Asthma Mother    Allergies Mother    Allergies Sister    Allergies Brother    Deep vein thrombosis Brother        two brothers with recurrent DVT   Heart attack Father        a.60s b. deceased in his 52s   Coronary artery disease Father    Colon cancer Neg Hx    Liver cancer Neg Hx    Esophageal cancer Neg Hx    Colon polyps Neg Hx    Rectal cancer Neg Hx    Pancreatic cancer Neg Hx    Stomach cancer Neg Hx      Social History   Occupational History   Occupation: unemployed   Tobacco Use   Smoking status: Every Day    Packs/day: 0.25    Years: 39.00    Pack years: 9.75    Types: Cigarettes, Cigars   Smokeless tobacco: Never  Vaping Use   Vaping Use: Never used  Substance and Sexual Activity   Alcohol use: Yes    Alcohol/week: 6.0 standard drinks    Types: 2 Cans of beer, 4 Shots of liquor per week   Drug use: Not Currently    Types: Cocaine    Comment: 12/20/2016 "might have used some the other day; I'm not sure"   Sexual activity: Not Currently    Allergies  Allergen Reactions   Atorvastatin     myalgia     Outpatient Medications Prior to Visit  Medication Sig Dispense Refill   albuterol (PROAIR HFA) 108 (90 Base) MCG/ACT inhaler Inhale 2 puffs into the lungs every 6 (six) hours as needed for wheezing or shortness of breath. 18 g 6   albuterol (PROVENTIL) (2.5 MG/3ML) 0.083% nebulizer solution Take 3 mLs (2.5 mg  total) by nebulization every 6 (six) hours as needed for wheezing or shortness of breath. 75 mL 3   apixaban (ELIQUIS) 5 MG TABS tablet TAKE 1 TABLET (5 MG TOTAL) BY MOUTH 2 (TWO) TIMES DAILY. 180 tablet 1   cetirizine (ZYRTEC) 10 MG tablet Take 1 tablet (10 mg total) by mouth daily. (Patient taking differently: Take 10 mg by mouth daily as needed for allergies.) 30 tablet 3   Evolocumab 140 MG/ML SOAJ INJECT 140 MG INTO THE SKIN EVERY 14 (FOURTEEN) DAYS. 6 mL 3   ezetimibe (ZETIA) 10 MG tablet TAKE 1 TABLET (10 MG TOTAL) BY MOUTH DAILY. (Patient taking differently: Take 10 mg by mouth daily.) 90 tablet 3   fluticasone (FLONASE) 50 MCG/ACT nasal spray PLACE 2 SPRAYS INTO BOTH NOSTRILS 2 (TWO) TIMES DAILY. 16 g 2   Fluticasone-Umeclidin-Vilant 100-62.5-25 MCG/INH AEPB INHALE 1 INHALER INTO THE LUNGS DAILY. (Patient taking differently: Inhale 1 puff into the lungs daily.) 60 each 6   furosemide (LASIX) 40 MG tablet Take 1 tablet (40 mg total) by mouth daily as needed (May take 40mg  Daily for Swelling and Shortness of breath). 90 tablet 1    gabapentin (NEURONTIN) 300 MG capsule Take 1 capsule (300 mg total) by mouth 2 (two) times daily. 60 capsule 6   isosorbide mononitrate (IMDUR) 30 MG 24 hr tablet Take 1 tablet (30 mg total) by mouth daily. 90 tablet 1   methocarbamol (ROBAXIN) 500 MG tablet Take 1 tablet (500 mg total) by mouth every 8 (eight) hours as needed for muscle spasms. 90 tablet 1   metoprolol succinate (TOPROL-XL) 25 MG 24 hr tablet Take 1 tablet (25 mg total) by mouth daily. 90 tablet 1   nitroGLYCERIN (NITROSTAT) 0.4 MG SL tablet Place 1 tablet (0.4 mg total) under the tongue every 5 (five) minutes as needed for chest pain. 25 tablet 3   oxyCODONE-acetaminophen (PERCOCET) 10-325 MG tablet Take 1 tablet by mouth every 4 (four) hours as needed for pain. (Patient not taking: Reported on 04/07/2021) 30 tablet 0   pantoprazole (PROTONIX) 40 MG tablet TAKE 1 TABLET (40 MG TOTAL) BY MOUTH DAILY. 30 tablet 6   triamcinolone cream (KENALOG) 0.1 % Apply 1 application topically 2 (two) times daily. 80 g 1   vitamin B-12 (CYANOCOBALAMIN) 1000 MCG tablet Take 1 tablet (1,000 mcg total) by mouth daily. 30 tablet 5   No facility-administered medications prior to visit.    ROS   Objective:  There were no vitals filed for this visit.    Physical Exam: General: Well-appearing, no acute distress HENT: [place], AT, OP clear, MMM Eyes: EOMI, no scleral icterus Respiratory: Clear to auscultation bilaterally.  No crackles, wheezing or rales Cardiovascular: RRR, -M/R/G, no JVD GI: BS+, soft, nontender Extremities:-Edema,-tenderness Neuro: AAO x4, CNII-XII grossly intact Skin: Intact, no rashes or bruising Psych: Normal mood, normal affect  Data Reviewed:  Imaging:  PFT:  Labs:      Assessment & Plan:   Discussion: ***  Health Maintenance Immunization History  Administered Date(s) Administered   Hepatitis B, adult 02/24/2015, 04/08/2015   Influenza Split 02/11/2012   Influenza,inj,Quad PF,6+ Mos 02/22/2013,  01/27/2014, 12/02/2014, 02/03/2016, 12/21/2016, 03/06/2018, 01/21/2019, 01/01/2020, 12/01/2020   PFIZER(Purple Top)SARS-COV-2 Vaccination 06/28/2019, 07/19/2019   Pneumococcal Polysaccharide-23 03/24/2012, 01/02/2015   Tdap 04/21/2019   Zoster Recombinat (Shingrix) 04/21/2019, 01/01/2020   CT Lung Screen***  No orders of the defined types were placed in this encounter. No orders of the defined types were placed in this encounter.  No follow-ups on file.  I have spent a total time of***-minutes on the day of the appointment reviewing prior documentation, coordinating care and discussing medical diagnosis and plan with the patient/family. Imaging, labs and tests included in this note have been reviewed and interpreted independently by me.  [place], MD [facility] 05/03/2021 8:33 AM  Office Number [phone]

## 2021-05-04 ENCOUNTER — Ambulatory Visit: Payer: Medicare Other

## 2021-05-09 ENCOUNTER — Ambulatory Visit: Payer: Medicare Other

## 2021-05-11 ENCOUNTER — Ambulatory Visit: Payer: Medicare Other | Admitting: Physical Therapy

## 2021-05-13 DIAGNOSIS — Z20822 Contact with and (suspected) exposure to covid-19: Secondary | ICD-10-CM | POA: Diagnosis not present

## 2021-05-17 ENCOUNTER — Other Ambulatory Visit: Payer: Self-pay | Admitting: Pharmacist

## 2021-05-17 ENCOUNTER — Other Ambulatory Visit: Payer: Self-pay

## 2021-05-17 ENCOUNTER — Ambulatory Visit: Payer: Self-pay | Admitting: *Deleted

## 2021-05-17 DIAGNOSIS — J441 Chronic obstructive pulmonary disease with (acute) exacerbation: Secondary | ICD-10-CM

## 2021-05-17 MED ORDER — AZELASTINE-FLUTICASONE 137-50 MCG/ACT NA SUSP
1.0000 | Freq: Two times a day (BID) | NASAL | 1 refills | Status: DC
  Filled 2021-05-17: qty 23, 30d supply, fill #0
  Filled 2021-08-30: qty 23, 30d supply, fill #1

## 2021-05-17 MED ORDER — ALBUTEROL SULFATE HFA 108 (90 BASE) MCG/ACT IN AERS
2.0000 | INHALATION_SPRAY | Freq: Four times a day (QID) | RESPIRATORY_TRACT | 6 refills | Status: DC | PRN
  Filled 2021-05-17: qty 8.5, 25d supply, fill #0
  Filled 2021-08-30: qty 8.5, 25d supply, fill #1

## 2021-05-17 MED ORDER — ALBUTEROL SULFATE HFA 108 (90 BASE) MCG/ACT IN AERS
2.0000 | INHALATION_SPRAY | Freq: Four times a day (QID) | RESPIRATORY_TRACT | 6 refills | Status: DC | PRN
  Filled 2021-05-17: qty 18, 25d supply, fill #0

## 2021-05-17 MED ORDER — TRELEGY ELLIPTA 100-62.5-25 MCG/ACT IN AEPB
1.0000 | INHALATION_SPRAY | Freq: Every day | RESPIRATORY_TRACT | 4 refills | Status: DC
  Filled 2021-05-17: qty 60, 30d supply, fill #0
  Filled 2021-08-30 – 2021-12-13 (×2): qty 60, 30d supply, fill #1

## 2021-05-17 MED ORDER — TRELEGY ELLIPTA 100-62.5-25 MCG/ACT IN AEPB
1.0000 | INHALATION_SPRAY | Freq: Every day | RESPIRATORY_TRACT | 4 refills | Status: DC
  Filled 2021-05-17: qty 28, fill #0

## 2021-05-17 NOTE — Telephone Encounter (Signed)
Summary: medication management   Patient sates pharmacy informed him PCP d/c symbicort inhaler or any inhaler and he wants to know why because he has asthma       Chief Complaint: requesting medication clarification regarding inhalers, symbicort. Symptoms: no c/o shortness of breath at this time. Requesting to know why symbicort inhaler discontinued on 09/17/19.  Frequency: out of inhalers except trelegy .  Pertinent Negatives: Patient denies shortness of breath or difficulty breathing. Disposition: [] ED /[] Urgent Care (no appt availability in office) / [] Appointment(In office/virtual)/ []  [facility]/ [] Home Care/ [] Refused Recommended Disposition /[] [place]/ [x]  Follow-up with PCP Additional Notes:   Please review medications prescribed for treatment of asthma with patient. NT reviewed medications on med list trelegy ellipta 1 puff daily, albuterol (proair HFA) inhaler 2 puff every 6 hours and albuterol (probventil) nebulizer 2/5 mg as needed every 6 hours. Please advise and review with patient if other medications are needed.     Reason for Disposition  [1] Caller has NON-URGENT medicine question about med that PCP prescribed AND [2] triager unable to answer question  Answer Assessment - Initial Assessment Questions 1. NAME of MEDICATION: "What medicine are you calling about?"     Symbicort inhaler and all respiratory medications. 2. QUESTION: "What is your question?" (e.g., double dose of medicine, side effect)     Why was symbicort discontinued? What inhalers are prescribed and supposed to take . 3. PRESCRIBING HCP: "Who prescribed it?" Reason: if prescribed by specialist, call should be referred to that group.     PCP 4. SYMPTOMS: "Do you have any symptoms?"     Not at this time  5. SEVERITY: If symptoms are present, ask "Are they mild, moderate or severe?"     na 6. PREGNANCY:  "Is there any chance that you are pregnant?" "When was your last menstrual  period?"     na  Protocols used: Medication Question Call-A-AH

## 2021-05-17 NOTE — Telephone Encounter (Signed)
I have sent a stronger nasal spray to his pharmacy.

## 2021-05-17 NOTE — Telephone Encounter (Signed)
Advised that Fluticasone-Umeclidin-Vilant (TRELEGY ELLIPTA) 100-62.5-25 was sent to pharmacy today   Patient states he just wants an inhaler to walk around with and stronger nasal spray. He states that the Symbicort was discontinued. Albuterol MDI was sent to pharmacy.   Please advise on nasal spray

## 2021-05-18 ENCOUNTER — Telehealth: Payer: Self-pay | Admitting: Licensed Clinical Social Worker

## 2021-05-18 ENCOUNTER — Ambulatory Visit: Payer: Medicare Other | Admitting: Physical Therapy

## 2021-05-18 ENCOUNTER — Other Ambulatory Visit: Payer: Self-pay

## 2021-05-18 NOTE — Telephone Encounter (Signed)
Patient aware that medication was sent to the pharmacy.

## 2021-05-18 NOTE — Telephone Encounter (Signed)
LCSW called and spoke with pt this morning, noted that he had cancelled other appointments this week due to ongoing bedbug challenges at his apartment. He states that he is going to have to reschedule his appt on Friday. I have messaged Haleigh, CMA, to make her aware and maybe get rescheduled and cancelled his ride. Remain available should pt need assistance w/ transportation moving forward to appts.   Westley Hummer, MSW, [place]  [phone]- work cell phone (preferred) [phone]- desk phone

## 2021-05-20 ENCOUNTER — Other Ambulatory Visit: Payer: Self-pay

## 2021-05-20 ENCOUNTER — Ambulatory Visit: Payer: Medicaid Other

## 2021-05-20 DIAGNOSIS — Z20822 Contact with and (suspected) exposure to covid-19: Secondary | ICD-10-CM | POA: Diagnosis not present

## 2021-05-24 ENCOUNTER — Other Ambulatory Visit: Payer: Self-pay

## 2021-06-22 DIAGNOSIS — Z1152 Encounter for screening for COVID-19: Secondary | ICD-10-CM | POA: Diagnosis not present

## 2021-06-28 ENCOUNTER — Ambulatory Visit: Payer: Commercial Managed Care - HMO | Admitting: Podiatry

## 2021-07-07 DIAGNOSIS — Z1152 Encounter for screening for COVID-19: Secondary | ICD-10-CM | POA: Diagnosis not present

## 2021-07-13 DIAGNOSIS — Z1152 Encounter for screening for COVID-19: Secondary | ICD-10-CM | POA: Diagnosis not present

## 2021-07-22 DIAGNOSIS — Z1152 Encounter for screening for COVID-19: Secondary | ICD-10-CM | POA: Diagnosis not present

## 2021-07-28 DIAGNOSIS — Z1152 Encounter for screening for COVID-19: Secondary | ICD-10-CM | POA: Diagnosis not present

## 2021-08-02 DIAGNOSIS — Z5181 Encounter for therapeutic drug level monitoring: Secondary | ICD-10-CM | POA: Diagnosis not present

## 2021-08-03 DIAGNOSIS — Z5181 Encounter for therapeutic drug level monitoring: Secondary | ICD-10-CM | POA: Diagnosis not present

## 2021-08-04 DIAGNOSIS — Z1152 Encounter for screening for COVID-19: Secondary | ICD-10-CM | POA: Diagnosis not present

## 2021-08-13 DIAGNOSIS — Z1152 Encounter for screening for COVID-19: Secondary | ICD-10-CM | POA: Diagnosis not present

## 2021-08-16 DIAGNOSIS — Z5181 Encounter for therapeutic drug level monitoring: Secondary | ICD-10-CM | POA: Diagnosis not present

## 2021-08-17 DIAGNOSIS — Z5181 Encounter for therapeutic drug level monitoring: Secondary | ICD-10-CM | POA: Diagnosis not present

## 2021-08-18 DIAGNOSIS — Z1152 Encounter for screening for COVID-19: Secondary | ICD-10-CM | POA: Diagnosis not present

## 2021-08-24 ENCOUNTER — Ambulatory Visit: Payer: Medicare Other | Admitting: Podiatry

## 2021-08-30 ENCOUNTER — Other Ambulatory Visit: Payer: Self-pay | Admitting: Family Medicine

## 2021-08-30 ENCOUNTER — Other Ambulatory Visit: Payer: Self-pay

## 2021-08-30 DIAGNOSIS — Z5181 Encounter for therapeutic drug level monitoring: Secondary | ICD-10-CM | POA: Diagnosis not present

## 2021-08-30 MED ORDER — PANTOPRAZOLE SODIUM 40 MG PO TBEC
DELAYED_RELEASE_TABLET | Freq: Every day | ORAL | 2 refills | Status: DC
  Filled 2021-08-30 – 2021-12-13 (×2): qty 30, 30d supply, fill #0
  Filled 2022-03-28: qty 30, 30d supply, fill #1

## 2021-08-31 ENCOUNTER — Other Ambulatory Visit: Payer: Self-pay

## 2021-09-01 ENCOUNTER — Other Ambulatory Visit: Payer: Self-pay

## 2021-09-01 ENCOUNTER — Ambulatory Visit (INDEPENDENT_AMBULATORY_CARE_PROVIDER_SITE_OTHER): Payer: Medicare Other | Admitting: Pharmacist Clinician (PhC)/ Clinical Pharmacy Specialist

## 2021-09-01 ENCOUNTER — Encounter: Payer: Self-pay | Admitting: Pharmacist Clinician (PhC)/ Clinical Pharmacy Specialist

## 2021-09-01 DIAGNOSIS — I5042 Chronic combined systolic (congestive) and diastolic (congestive) heart failure: Secondary | ICD-10-CM

## 2021-09-01 MED ORDER — EMPAGLIFLOZIN 10 MG PO TABS
10.0000 mg | ORAL_TABLET | Freq: Every day | ORAL | 3 refills | Status: DC
  Filled 2021-09-01: qty 90, 90d supply, fill #0
  Filled 2021-12-13: qty 90, 90d supply, fill #1

## 2021-09-01 MED ORDER — LOSARTAN POTASSIUM 25 MG PO TABS
25.0000 mg | ORAL_TABLET | Freq: Every day | ORAL | 3 refills | Status: DC
  Filled 2021-09-01: qty 90, 90d supply, fill #0
  Filled 2021-12-13: qty 90, 90d supply, fill #1

## 2021-09-01 NOTE — Progress Notes (Unsigned)
09/01/2021 MARQUIN PATINO [birth year] [id number]   HPI:  [name] is a 61 y.o. male patient of Dr Margaretann Loveless, with a PMH below who presents today for heart failure medication titration.    Last seen 1/23 by Gerald Stabs.  Wasn't taking meds - didn't want to take without food and had no appetitie; didn't know what meds had at home 40-45% 11/22 echo  Stopped for 2-3 weeks, then started about 3-4 day ago   Past Medical History: CAD NSTEMI  HTN   VTE   HLD         Blood Pressure Goal:  130/80  Current Medications: furosemide 40 mg qd, isosorbide monon 30 mg qd, metoprolol scuc 25 mg qd  Family Hx: mother died from asthma, father had pacemaker otherwise unknown; 3 brothers 1 sister; 2 brothers with blood clots; no children  Social Hx: smokes < 1/2 ppd (somewhere between 1-6 cigarettes per day); alcohol most weeks, not daily - liquor and beer; drinks soda, only occasional cola; no coffee  Diet: mostly home, but hasn't been to grocery store in some time;   Exercise: no exercise legs still give problems  Home BP readings:   Intolerances: atorvastatin - myalgias  Labs:    Wt Readings from Last 3 Encounters:  04/07/21 204 lb (92.5 kg)  03/29/21 196 lb (88.9 kg)  12/30/20 215 lb 6.4 oz (97.7 kg)   BP Readings from Last 3 Encounters:  04/07/21 133/87  03/29/21 (!) 144/98  12/30/20 (!) 142/88   Pulse Readings from Last 3 Encounters:  04/07/21 82  03/29/21 99  12/30/20 72    Current Outpatient Medications  Medication Sig Dispense Refill   albuterol (PROAIR HFA) 108 (90 Base) MCG/ACT inhaler Inhale 2 puffs into the lungs every 6 (six) hours as needed for wheezing or shortness of breath. 8.5 g 6   albuterol (PROVENTIL) (2.5 MG/3ML) 0.083% nebulizer solution Take 3 mLs (2.5 mg total) by nebulization every 6 (six) hours as needed for wheezing or shortness of breath. 75 mL 3   apixaban (ELIQUIS) 5 MG TABS tablet TAKE 1 TABLET (5 MG TOTAL) BY MOUTH 2 (TWO) TIMES DAILY.  180 tablet 1   Azelastine-Fluticasone 137-50 MCG/ACT SUSP Place 1 spray into the nose every 12 (twelve) hours. 23 g 1   cetirizine (ZYRTEC) 10 MG tablet Take 1 tablet (10 mg total) by mouth daily. (Patient taking differently: Take 10 mg by mouth daily as needed for allergies.) 30 tablet 3   Evolocumab 140 MG/ML SOAJ INJECT 140 MG INTO THE SKIN EVERY 14 (FOURTEEN) DAYS. 6 mL 3   ezetimibe (ZETIA) 10 MG tablet TAKE 1 TABLET (10 MG TOTAL) BY MOUTH DAILY. (Patient taking differently: Take 10 mg by mouth daily.) 90 tablet 3   Fluticasone-Umeclidin-Vilant (TRELEGY ELLIPTA) 100-62.5-25 MCG/ACT AEPB Inhale 1 puff into the lungs daily. 60 each 4   furosemide (LASIX) 40 MG tablet Take 1 tablet (40 mg total) by mouth daily as needed (May take '40mg'$  Daily for Swelling and Shortness of breath). 90 tablet 1   gabapentin (NEURONTIN) 300 MG capsule Take 1 capsule (300 mg total) by mouth 2 (two) times daily. 60 capsule 6   isosorbide mononitrate (IMDUR) 30 MG 24 hr tablet Take 1 tablet (30 mg total) by mouth daily. 90 tablet 1   methocarbamol (ROBAXIN) 500 MG tablet Take 1 tablet (500 mg total) by mouth every 8 (eight) hours as needed for muscle spasms. 90 tablet 1   metoprolol succinate (TOPROL-XL) 25 MG  24 hr tablet Take 1 tablet (25 mg total) by mouth daily. 90 tablet 1   nitroGLYCERIN (NITROSTAT) 0.4 MG SL tablet Place 1 tablet (0.4 mg total) under the tongue every 5 (five) minutes as needed for chest pain. 25 tablet 3   oxyCODONE-acetaminophen (PERCOCET) 10-325 MG tablet Take 1 tablet by mouth every 4 (four) hours as needed for pain. (Patient not taking: Reported on 04/07/2021) 30 tablet 0   pantoprazole (PROTONIX) 40 MG tablet TAKE 1 TABLET (40 MG TOTAL) BY MOUTH DAILY. 30 tablet 2   triamcinolone cream (KENALOG) 0.1 % Apply 1 application topically 2 (two) times daily. 80 g 1   vitamin B-12 (CYANOCOBALAMIN) 1000 MCG tablet Take 1 tablet (1,000 mcg total) by mouth daily. 30 tablet 5   No current  facility-administered medications for this visit.    Allergies  Allergen Reactions   Atorvastatin     myalgia    Past Medical History:  Diagnosis Date   Asthma    CAD (coronary artery disease)    a. 09/2013 NSTEMI/PCI: LM nl, LAD 40-50%, D1 100 (2.25 x 28 Vision BMS), LCX min irregs, RI 60-70, 30, RCA 40-50/50-63m/p, EF 45-50%.  b. cath 12/2014 -occulded BMS in diag, 50% pro to mid LAD, 60% ramus, 30% RCA    Chest pain 08/2015   Chronic diastolic CHF (congestive heart failure) ([place]    a. 09/2014 EF 45-50% by LV gram;  b. 01/2014 Echo: EF 55-60%, Gr 1 DD.   Cocaine abuse ([facility]    COPD (chronic obstructive pulmonary disease) ([facility]    DVT (deep venous thrombosis) ([facility]    "I had ~ 10 in each leg" (12/20/2016)   Essential hypertension    Hepatitis C    "clear free over a year now"   High cholesterol    Myocardial infarction ([facility] ~ 2014   Osteoarthritis    a. hands and toes.   Pneumonia    "several times" (12/20/2016)   Pulmonary embolism ([facility]    a. 2012 - s/p IVC filter;  b. prev on eliquis - noncompliant.   Shortness of breath dyspnea    Sinus headache    Squamous cell skin cancer 07/13/2016   "foot"   Tobacco abuse     There were no vitals taken for this visit.  No problem-specific Assessment & Plan notes found for this encounter.   KTommy MedalPharmD CPP [facility] [address] [phone]

## 2021-09-01 NOTE — Patient Instructions (Signed)
Return for a a follow up appointment Thursday July 13 at 11 am  Try to take your medications more days than you miss.  See if using the 7 day pill minder is helpful or not.    Take your meds as follows:  Start losartan 25 mg and jardiance 10 mg both once daily.  You can cut the furosemide (fluid pill) back to just 2-3 days a week unless you have more swelling, then go back to daily.    Continue with your other medications.

## 2021-09-03 ENCOUNTER — Encounter: Payer: Self-pay | Admitting: Pharmacist Clinician (PhC)/ Clinical Pharmacy Specialist

## 2021-09-03 NOTE — Assessment & Plan Note (Signed)
Patient with HFmrEF at 40-45%, currently not on GDMT.  He does have problems with compliance.  We discussed the importance of GDMT on increasing pumping function and preventing worsening heart failure.  Patient states will try to increase compliance with medications and was given 4x/day - 7 day pill minder.  Encouraged him to use and bring back if he has any concerns about how to divide up his medications (he really only needs bid spaces).  Will have him start losartan 25 mg and Jardiance 10 mg, both once daily.  We will see him back in the office next month for close follow up.

## 2021-09-05 DIAGNOSIS — Z5181 Encounter for therapeutic drug level monitoring: Secondary | ICD-10-CM | POA: Diagnosis not present

## 2021-09-05 DIAGNOSIS — Z1152 Encounter for screening for COVID-19: Secondary | ICD-10-CM | POA: Diagnosis not present

## 2021-09-06 ENCOUNTER — Ambulatory Visit (INDEPENDENT_AMBULATORY_CARE_PROVIDER_SITE_OTHER): Payer: Medicare Other | Admitting: Gastroenterology

## 2021-09-06 ENCOUNTER — Encounter: Payer: Self-pay | Admitting: Gastroenterology

## 2021-09-06 ENCOUNTER — Other Ambulatory Visit (INDEPENDENT_AMBULATORY_CARE_PROVIDER_SITE_OTHER): Payer: Medicare Other

## 2021-09-06 ENCOUNTER — Telehealth: Payer: Self-pay

## 2021-09-06 ENCOUNTER — Other Ambulatory Visit: Payer: Self-pay

## 2021-09-06 VITALS — BP 128/82 | HR 71 | Ht 74.0 in | Wt 180.1 lb

## 2021-09-06 DIAGNOSIS — R630 Anorexia: Secondary | ICD-10-CM

## 2021-09-06 DIAGNOSIS — Z86010 Personal history of colonic polyps: Secondary | ICD-10-CM

## 2021-09-06 DIAGNOSIS — K219 Gastro-esophageal reflux disease without esophagitis: Secondary | ICD-10-CM

## 2021-09-06 DIAGNOSIS — Z7901 Long term (current) use of anticoagulants: Secondary | ICD-10-CM

## 2021-09-06 DIAGNOSIS — R634 Abnormal weight loss: Secondary | ICD-10-CM

## 2021-09-06 LAB — CBC WITH DIFFERENTIAL/PLATELET
Basophils Absolute: 0 10*3/uL (ref 0.0–0.1)
Basophils Relative: 0.8 % (ref 0.0–3.0)
Eosinophils Absolute: 0.6 10*3/uL (ref 0.0–0.7)
Eosinophils Relative: 12.1 % — ABNORMAL HIGH (ref 0.0–5.0)
HCT: 42.5 % (ref 39.0–52.0)
Hemoglobin: 13.9 g/dL (ref 13.0–17.0)
Lymphocytes Relative: 41.3 % (ref 12.0–46.0)
Lymphs Abs: 1.9 10*3/uL (ref 0.7–4.0)
MCHC: 32.7 g/dL (ref 30.0–36.0)
MCV: 99.1 fl (ref 78.0–100.0)
Monocytes Absolute: 0.4 10*3/uL (ref 0.1–1.0)
Monocytes Relative: 9.5 % (ref 3.0–12.0)
Neutro Abs: 1.7 10*3/uL (ref 1.4–7.7)
Neutrophils Relative %: 36.3 % — ABNORMAL LOW (ref 43.0–77.0)
Platelets: 164 10*3/uL (ref 150.0–400.0)
RBC: 4.29 Mil/uL (ref 4.22–5.81)
RDW: 14.7 % (ref 11.5–15.5)
WBC: 4.6 10*3/uL (ref 4.0–10.5)

## 2021-09-06 LAB — COMPREHENSIVE METABOLIC PANEL
ALT: 13 U/L (ref 0–53)
AST: 18 U/L (ref 0–37)
Albumin: 3.7 g/dL (ref 3.5–5.2)
Alkaline Phosphatase: 136 U/L — ABNORMAL HIGH (ref 39–117)
BUN: 7 mg/dL (ref 6–23)
CO2: 26 mEq/L (ref 19–32)
Calcium: 9.5 mg/dL (ref 8.4–10.5)
Chloride: 104 mEq/L (ref 96–112)
Creatinine, Ser: 0.6 mg/dL (ref 0.40–1.50)
GFR: 104.34 mL/min (ref >60.00)
Glucose, Bld: 85 mg/dL (ref 70–99)
Potassium: 3.9 mEq/L (ref 3.5–5.1)
Sodium: 138 mEq/L (ref 135–145)
Total Bilirubin: 0.5 mg/dL (ref 0.2–1.2)
Total Protein: 8.1 g/dL (ref 6.0–8.3)

## 2021-09-06 MED ORDER — NA SULFATE-K SULFATE-MG SULF 17.5-3.13-1.6 GM/177ML PO SOLN
1.0000 | Freq: Once | ORAL | 0 refills | Status: AC
Start: 2021-09-06 — End: 2021-09-08
  Filled 2021-09-06: qty 354, 1d supply, fill #0

## 2021-09-06 NOTE — Telephone Encounter (Signed)
Request for surgical clearance:     Endoscopy Procedure  What type of surgery is being performed?     Colon /EGD  When is this surgery scheduled?     10/17/21  What type of clearance is required ?   Pharmacy  Are there any medications that need to be held prior to surgery and how long? Eliquis x2 days prior to procedure   Practice name and name of physician performing surgery?      [facility]-Dr. Havery Moros   What is your office phone and fax number?      Phone- [phone]  [phone]  Anesthesia type (None, local, MAC, general) ?       MAC

## 2021-09-06 NOTE — Progress Notes (Signed)
HPI :  61 year old male with a history of tobacco use, COPD, recurrent DVT and PE on Eliquis, CAD, CHF, here to reestablish his GI care for weight loss, history of colon polyps, GERD.  He was last seen in February 2020  Main complaint he has today is significant weight loss.  He states he thinks he weighed about 220 pounds about 8 months ago, now down to about 180 pounds, significant weight loss.  He states he is not eating well at all.  Poor appetite with some early satiety for several months.  Denies any dysphagia, no odynophagia.  He is on Protonix daily and states his heartburn is well controlled without any breakthrough.  He uses Advil occasionally for leg pains but not routinely. He denies any blood in his stools.  No problems with his bowel habits that bother him.  He denies any abdominal pains that bother him.  Recall he has a history of hepatitis C years ago that was treated and eradicated.  He has a significant cardiac history as outlined.  He is followed closely by cardiology.  There has been some issues with medication compliance per review of the chart.  His last echocardiogram in 01/2021 showed an EF of 40 to 45%.  He denies any cardiopulmonary symptoms today, states he is doing pretty well in regards to his COPD without any flares recently.  He is not on any supplemental oxygen.  He last had lab work done last October.  He had a CT chest done for lung cancer screening last October which was negative.  He has not had cross-sectional imaging of his abdomen in several years.  He is recovering from a back surgery he had within the past year as well.  He does not think he is ever had an EGD.  He endorses longstanding reflux symptoms over years.  His last colonoscopy with me was in February 2020, he had 3 small adenomas removed amongst 8 polyps.    Colonoscopy 05/01/2018: One 6 mm polyp in the transverse colon, removed with a cold snare. Resected and retrieved. - One 3 mm polyp in the  sigmoid colon, removed with a cold snare. Resected and retrieved. - One 3 mm polyp at the recto-sigmoid colon, removed with a cold snare. Resected and retrieved. - Five 3 to 4 mm polyps in the rectum, removed with a cold snare. Resected and retrieved. - Diverticulosis in the transverse colon and in the left colon. - Internal hemorrhoids. - The examination was otherwise normal.  Surgical [P], rectum, transverse, sigmoid, recto-sigmoid, polyp (8) - TUBULAR ADENOMA (X3 FRAGMENTS). - HYPERPLASTIC POLYP (X6 FRAGMENTS). - NO HIGH GRADE DYSPLASIA OR MALIGNANCY.   Echocardiogram 02/08/21 - EF 40-45%, grade I DD,    Past Medical History:  Diagnosis Date   Asthma    CAD (coronary artery disease)    a. 09/2013 NSTEMI/PCI: LM nl, LAD 40-50%, D1 100 (2.25 x 28 Vision BMS), LCX min irregs, RI 60-70, 30, RCA 40-50/50-31m/p, EF 45-50%.  b. cath 12/2014 -occulded BMS in diag, 50% pro to mid LAD, 60% ramus, 30% RCA    Chest pain 08/2015   Chronic diastolic CHF (congestive heart failure) ([facility] [place]    a. 09/2014 EF 45-50% by LV gram;  b. 01/2014 Echo: EF 55-60%, Gr 1 DD.   Cocaine abuse ([facility])    COPD (chronic obstructive pulmonary disease) ([facility])    DVT (deep venous thrombosis) ([facility]    "I had ~ 10 in each leg" (12/20/2016)   Essential hypertension  Hepatitis C    "clear free over a year now"   High cholesterol    Myocardial infarction [facility]) ~ 2014   Osteoarthritis    a. hands and toes.   Pneumonia    "several times" (12/20/2016)   Pulmonary embolism ([place])    a. 2012 - s/p IVC filter;  b. prev on eliquis - noncompliant.   Shortness of breath dyspnea    Sinus headache    Squamous cell skin cancer 07/13/2016   "foot"   Tobacco abuse      Past Surgical History:  Procedure Laterality Date   BACK SURGERY  2022   CARDIAC CATHETERIZATION N/A 01/04/2015   Procedure: Left Heart Cath and Coronary Angiography;  Surgeon: Burnell Blanks, MD;  Location: [place] CV LAB;  Service: Cardiovascular;   Laterality: N/A;   CORONARY ANGIOPLASTY WITH STENT PLACEMENT  10/21/2013   BMS to D1   CYST EXCISION N/A 05/10/2016   Procedure: EXCISION OF SEBACEOUS CYST UPPER BACK;  Surgeon: Donnie Mesa, MD;  Location: [place];  Service: General;  Laterality: N/A;   FOOT SURGERY Right ~ 06/2016   "said there was skin cancer on it"   LEFT HEART CATHETERIZATION WITH CORONARY ANGIOGRAM N/A 10/21/2013   Procedure: LEFT HEART CATHETERIZATION WITH CORONARY ANGIOGRAM;  Surgeon: Leonie Man, MD;  Location: [facility] CATH LAB;  Service: Cardiovascular;  Laterality: N/A;   VENA CAVA FILTER PLACEMENT  ~ 2007-~ 2017   "1 in my neck; 2 in my wrist"   Family History  Problem Relation Age of Onset   Asthma Mother    Allergies Mother    Allergies Sister    Allergies Brother    Deep vein thrombosis Brother        two brothers with recurrent DVT   Heart attack Father        a.60s b. deceased in his 56s   Coronary artery disease Father    Colon cancer Neg Hx    Liver cancer Neg Hx    Esophageal cancer Neg Hx    Colon polyps Neg Hx    Rectal cancer Neg Hx    Pancreatic cancer Neg Hx    Stomach cancer Neg Hx    Social History   Tobacco Use   Smoking status: Every Day    Packs/day: 0.25    Years: 39.00    Total pack years: 9.75    Types: Cigarettes, Cigars   Smokeless tobacco: Never  Vaping Use   Vaping Use: Never used  Substance Use Topics   Alcohol use: Yes    Alcohol/week: 6.0 standard drinks of alcohol    Types: 2 Cans of beer, 4 Shots of liquor per week   Drug use: Not Currently    Types: Cocaine    Comment: 12/20/2016 "might have used some the other day; I'm not sure"   Current Outpatient Medications  Medication Sig Dispense Refill   albuterol (PROAIR HFA) 108 (90 Base) MCG/ACT inhaler Inhale 2 puffs into the lungs every 6 (six) hours as needed for wheezing or shortness of breath. 8.5 g 6   albuterol (PROVENTIL) (2.5 MG/3ML) 0.083% nebulizer solution Take 3 mLs (2.5 mg total) by nebulization  every 6 (six) hours as needed for wheezing or shortness of breath. 75 mL 3   apixaban (ELIQUIS) 5 MG TABS tablet TAKE 1 TABLET (5 MG TOTAL) BY MOUTH 2 (TWO) TIMES DAILY. 180 tablet 1   Azelastine-Fluticasone 137-50 MCG/ACT SUSP Place 1 spray into the nose every 12 (twelve)  hours. 23 g 1   cetirizine (ZYRTEC) 10 MG tablet Take 1 tablet (10 mg total) by mouth daily. (Patient taking differently: Take 10 mg by mouth daily as needed for allergies.) 30 tablet 3   empagliflozin (JARDIANCE) 10 MG TABS tablet Take 1 tablet (10 mg total) by mouth daily before breakfast. 90 tablet 3   Evolocumab 140 MG/ML SOAJ INJECT 140 MG INTO THE SKIN EVERY 14 (FOURTEEN) DAYS. 6 mL 3   ezetimibe (ZETIA) 10 MG tablet TAKE 1 TABLET (10 MG TOTAL) BY MOUTH DAILY. 90 tablet 3   Fluticasone-Umeclidin-Vilant (TRELEGY ELLIPTA) 100-62.5-25 MCG/ACT AEPB Inhale 1 puff into the lungs daily. 60 each 4   furosemide (LASIX) 40 MG tablet Take 1 tablet (40 mg total) by mouth daily as needed (May take '40mg'$  Daily for Swelling and Shortness of breath). 90 tablet 1   gabapentin (NEURONTIN) 300 MG capsule Take 1 capsule (300 mg total) by mouth 2 (two) times daily. 60 capsule 6   isosorbide mononitrate (IMDUR) 30 MG 24 hr tablet Take 1 tablet (30 mg total) by mouth daily. 90 tablet 1   losartan (COZAAR) 25 MG tablet Take 1 tablet (25 mg total) by mouth daily. 90 tablet 3   methocarbamol (ROBAXIN) 500 MG tablet Take 1 tablet (500 mg total) by mouth every 8 (eight) hours as needed for muscle spasms. 90 tablet 1   metoprolol succinate (TOPROL-XL) 25 MG 24 hr tablet Take 1 tablet (25 mg total) by mouth daily. 90 tablet 1   nitroGLYCERIN (NITROSTAT) 0.4 MG SL tablet Place 1 tablet (0.4 mg total) under the tongue every 5 (five) minutes as needed for chest pain. 25 tablet 3   pantoprazole (PROTONIX) 40 MG tablet TAKE 1 TABLET (40 MG TOTAL) BY MOUTH DAILY. 30 tablet 2   No current facility-administered medications for this visit.   Allergies  Allergen  Reactions   Atorvastatin     myalgia     Review of Systems: All systems reviewed and negative except where noted in HPI.    Lab Results  Component Value Date   WBC 4.4 12/01/2020   HGB 13.5 12/01/2020   HCT 39.3 12/01/2020   MCV 89 12/01/2020   PLT 174 12/01/2020    Lab Results  Component Value Date   CREATININE 0.76 11/02/2020   BUN 10 11/02/2020   NA 136 11/02/2020   K 3.5 11/02/2020   CL 106 11/02/2020   CO2 21 (L) 11/02/2020    Lab Results  Component Value Date   ALT 25 08/13/2020   AST 18 08/13/2020   ALKPHOS 122 (H) 08/13/2020   BILITOT <0.2 08/13/2020     Physical Exam: BP 128/82   Pulse 71   Ht '6\' 2"'$  (1.88 m)   Wt 180 lb 2 oz (81.7 kg)   BMI 23.13 kg/m  Constitutional: Pleasant,well-developed, male in no acute distress. HEENT: Normocephalic and atraumatic. Conjunctivae are normal. No scleral icterus. Neck supple.  Cardiovascular: Normal rate, regular rhythm.  Pulmonary/chest: Effort normal and breath sounds normal.  Abdominal: Soft, nondistended, nontender.  There are no masses palpable.  Extremities: no edema Lymphadenopathy: No cervical adenopathy noted. Neurological: Alert and oriented to person place and time. Skin: Skin is warm and dry. No rashes noted. Psychiatric: Normal mood and affect. Behavior is normal.   ASSESSMENT AND PLAN: 61 year old male here to reestablish care for the following:  Weight loss Loss of appetite GERD History of colon polyps Anticoagulated  I looked through his chart and confirmed he has had  significant weight loss since last October, at least 35 pounds from what is documented.  He seems to attribute this to simply not eating enough, he is down to 1 meal per day due to loss of appetite and some early satiety.  Discussed differential diagnosis with him.  Given his dramatic weight loss and recommending cross-sectional imaging of his abdomen and pelvis to rule out malignancy, as he has not had imaging of his abdomen  in some time  He is agreeable to proceed with this. CT chest done within the past year and negative. Otherwise I do think he also would benefit from an EGD, he meets criteria for screening for Barrett's esophagus and with his upper tract symptoms with loss of appetite and early satiety this is also indicated.  We discussed timing of his next colonoscopy.  Current guidelines recommend surveillance colonoscopy anywhere from 3 to 5 years after his last exam.  He denies any bowel symptoms.  I think okay to hold off on his colonoscopy for another year or 2 however he states if he is being sedated for his EGD he would want to have his colonoscopy done at the same time, and that is his preference if within guideline criteria.  This would necessitate holding his Eliquis for 2 days preprocedure.  We will reach out to cardiologist to see if that is acceptable.  I discussed risks and benefits of endoscopy and anesthesia with him, risks of each, he wants to proceed.  Hopefully we can get this work-up done in the next few weeks.  He will continue his Protonix for now, I asked him to minimize his NSAID use.  Otherwise he needs to try to eat more calories as best he can, we discussed using supplemental boost and Ensure etc. to improve his nutritional status.  He will try to do this as best he can.  We will also send for basic labs today as its been a while since his last blood work.  He agrees with the plan as outlined.  Further recommendations pending the results.  Plan: - CT scan abdomen / pelvis with contrast - CBC, CMET today - stop NSAIDs - continue protonix - scheduled for EGD / Colonoscopy in the [place]. He will need approval from cardiology to hold Eliquis for 2 days pre-procedure - discussed his nutritional status and he will do his best to consume more calories on a daily basis  Jolly Mango, MD [facility]  CC: Charlott Rakes, MD

## 2021-09-06 NOTE — Patient Instructions (Signed)
Your provider has requested that you go to the basement level for lab work before leaving today. Press "B" on the elevator. The lab is located at the first door on the left as you exit the elevator.  We have sent the following medications to your pharmacy for you to pick up at your convenience:  [place] have been scheduled for a CT scan of the abdomen and pelvis at [facility], 1st floor Radiology. You are scheduled on 09/15/21  at 9:00am. You should arrive 30 minutes prior to your appointment time for registration. The solution may taste better if refrigerated, but do NOT add ice or any other liquid to this solution. Shake well before drinking.   Please follow the written instructions below on the day of your exam:   1) Do not eat anything after 5:00am (4 hours prior to your test)   2) Drink 1 bottle of contrast @ 7:00am (2 hours prior to your exam)  Remember to shake well before drinking and do NOT pour over ice.     Drink 1 bottle of contrast @ 8:00am (1 hour prior to your exam)   You may take any medications as prescribed with a small amount of water, if necessary. If you take any of the following medications: METFORMIN, GLUCOPHAGE, GLUCOVANCE, AVANDAMET, RIOMET, FORTAMET, Sellers MET, JANUMET, GLUMETZA or METAGLIP, you MAY be asked to HOLD this medication 48 hours AFTER the exam.   The purpose of you drinking the oral contrast is to aid in the visualization of your intestinal tract. The contrast solution may cause some diarrhea. Depending on your individual set of symptoms, you may also receive an intravenous injection of x-ray contrast/dye. Plan on being at [facility] for 45 minutes or longer, depending on the type of exam you are having performed.   If you have any questions regarding your exam or if you need to reschedule, you may call [facility] at [phone] between the hours of 8:00 am and 5:00 pm, Monday-Friday.   You will be contaced by our office prior to  your procedure for directions on holding your Eliquis.  If you do not hear from our office 1 week prior to your scheduled procedure, please call [phone] to discuss.  STOP ALL NSAIDS.   Continue your Pantoprazole.   Thank you for choosing me and [facility].  Dr. Succasunna Cellar

## 2021-09-07 ENCOUNTER — Other Ambulatory Visit: Payer: Self-pay

## 2021-09-07 DIAGNOSIS — Z1152 Encounter for screening for COVID-19: Secondary | ICD-10-CM | POA: Diagnosis not present

## 2021-09-08 NOTE — Telephone Encounter (Signed)
Patient followed by PCP for anticoagulation related to history of PE/DVT.  Please reach out to that practice for Eliquis hold

## 2021-09-09 NOTE — Telephone Encounter (Signed)
Okay to hold anticoagulation for 72 hours.

## 2021-09-13 ENCOUNTER — Telehealth: Payer: Self-pay | Admitting: Gastroenterology

## 2021-09-13 DIAGNOSIS — Z5181 Encounter for therapeutic drug level monitoring: Secondary | ICD-10-CM | POA: Diagnosis not present

## 2021-09-13 NOTE — Telephone Encounter (Signed)
EGD/colon was scheduled in the [facility] [place] for 7/24. I did advise patient that the EGD was to further assess his symptoms of weight loss, etc. Pt still wanted to cancel.

## 2021-09-13 NOTE — Telephone Encounter (Signed)
Inbound call from patient

## 2021-09-13 NOTE — Telephone Encounter (Signed)
Inbound call from patient cancelling upcoming procedure. Patient states he has chosen to egt the procedure in 2 years and will call back to rescheduled. Thank you

## 2021-09-13 NOTE — Telephone Encounter (Addendum)
   Name: [name]  DOB: 08-22-1960  MRN: [id number]   Primary Cardiologist: Elouise Munroe, MD  Chart reviewed as part of pre-operative protocol coverage. We are asked for guidance to hold eliquis.   We do not manage eliquis, as this has been prescribed for PE/DVT. Per Dr. Margarita Rana, [place] to hold [facility] for 2 days as requested.  I will route this recommendation to the requesting party via Epic fax function and remove from pre-op pool. Please call with questions.  Tami Lin Jovee Dettinger, PA 09/13/2021, 8:08 AM

## 2021-09-13 NOTE — Telephone Encounter (Signed)
Okay got it, thank you for clarifying

## 2021-09-13 NOTE — Telephone Encounter (Signed)
Lm on vm for patient to return call 

## 2021-09-13 NOTE — Telephone Encounter (Signed)
Thanks [employer]. Can you clarify when he was scheduled for his double procedure? Which date did he cancel? Colonoscopy okay to keep at 04/2023, he had the option of delaying that. He had lost almost 40 lbs and not eating well which is why the EGD was recommended. I do recommend he keep that appointment, but up to him he wanted to cancel it.

## 2021-09-13 NOTE — Telephone Encounter (Signed)
Pt returned call. I informed patient that Dr. Havery Moros said that he would not be due for a colonoscopy for another 2 years. I told pt that his EGD was to further assess his weight loss and GERD. Pt states that he is aware. He states that he gained about 3-4 lbs over the weekend and his appetite seems to be returning slowly. Pt wanted to keep both appts cancelled. Pt had no other concerns at the end of the call.

## 2021-09-14 DIAGNOSIS — Z1152 Encounter for screening for COVID-19: Secondary | ICD-10-CM | POA: Diagnosis not present

## 2021-09-15 ENCOUNTER — Ambulatory Visit (HOSPITAL_COMMUNITY): Admission: RE | Admit: 2021-09-15 | Payer: Medicare Other | Source: Ambulatory Visit

## 2021-09-21 ENCOUNTER — Ambulatory Visit (INDEPENDENT_AMBULATORY_CARE_PROVIDER_SITE_OTHER): Payer: Medicare Other | Admitting: Podiatry

## 2021-09-21 ENCOUNTER — Encounter: Payer: Self-pay | Admitting: Podiatry

## 2021-09-21 DIAGNOSIS — Z7901 Long term (current) use of anticoagulants: Secondary | ICD-10-CM | POA: Diagnosis not present

## 2021-09-21 DIAGNOSIS — B351 Tinea unguium: Secondary | ICD-10-CM | POA: Diagnosis not present

## 2021-09-21 DIAGNOSIS — M79675 Pain in left toe(s): Secondary | ICD-10-CM

## 2021-09-21 DIAGNOSIS — M79674 Pain in right toe(s): Secondary | ICD-10-CM

## 2021-09-21 NOTE — Progress Notes (Signed)
This patient returns to my office for at risk foot care.  This patient requires this care by a professional since this patient will be at risk due to having coagulation defect. Patient is taking eliquis.  This patient is unable to cut nails himself since the patient cannot reach his nails.These nails are painful walking and wearing shoes.  This patient presents for at risk foot care today.  General Appearance  Alert, conversant and in no acute stress.  Vascular  Dorsalis pedis and posterior tibial  pulses are palpable  bilaterally.  Capillary return is within normal limits  bilaterally. Temperature is within normal limits  bilaterally.  Neurologic  Senn-Weinstein monofilament wire test within normal limits  bilaterally. Muscle power within normal limits bilaterally.  Nails Thick disfigured discolored nails with subungual debris  from hallux to fifth toes bilaterally. No evidence of bacterial infection or drainage bilaterally.  Orthopedic  No limitations of motion  feet .  No crepitus or effusions noted.  No bony pathology or digital deformities noted.  Skin  normotropic skin with no porokeratosis noted bilaterally.  No signs of infections or ulcers noted.     Onychomycosis  Pain in right toes  Pain in left toes  Consent was obtained for treatment procedures.   Mechanical debridement of nails 1-5  bilaterally performed with a nail nipper.  Filed with dremel without incident.    Return office visit    4   months                  Told patient to return for periodic foot care and evaluation due to potential at risk complications.   Gardiner Barefoot DPM

## 2021-09-29 ENCOUNTER — Encounter: Payer: Self-pay | Admitting: Neurology

## 2021-09-29 ENCOUNTER — Ambulatory Visit: Payer: Commercial Managed Care - HMO | Admitting: Neurology

## 2021-10-06 ENCOUNTER — Ambulatory Visit: Payer: Medicare Other

## 2021-10-06 NOTE — Progress Notes (Deleted)
Patient ID: [name]                 DOB: December 04, 1960                      MRN: [id number]     HPI: [name] is a 61 y.o. male referred by Dr. Margaretann [name] to pharmacy clinic for HF medication management. PMH is significant for CAD, NSTEMI, HTN, CHF, COPD, Hep C, history of DVT, history of PE, and substance abuse. Most recent LVEF 40-45% on 02/08/21.  Today she returns to pharmacy clinic for further medication titration. At last visit with MD ***. Symptomatically, she is feeling ***, *** dizziness, lightheadedness, and fatigue. *** chest pain or palpitations. Feels SOB when ***. Able to complete all ADLs. Activity level ***. She *** checks her weight at home (normal range *** - *** lbs). *** LEE, PND, or orthopnea. Appetite has been ***. She *** adheres to a low-salt diet.  Current CHF meds:  Jardiance '10mg'$  daily Furosemide '40mg'$  daily Isosorbide mononitrate '30mg'$  daily Losartan '25mg'$  daily Toprol XL '25mg'$  daily  Previously tried:  BP goal:   Family History:   Social History:   Diet:   Exercise:   Home BP readings:   Wt Readings from Last 3 Encounters:  09/06/21 180 lb 2 oz (81.7 kg)  09/01/21 187 lb (84.8 kg)  04/07/21 204 lb (92.5 kg)   BP Readings from Last 3 Encounters:  09/06/21 128/82  09/01/21 120/82  04/07/21 133/87   Pulse Readings from Last 3 Encounters:  09/06/21 71  09/01/21 84  04/07/21 82    Renal function: CrCl cannot be calculated (Patient's most recent lab result is older than the maximum 21 days allowed.).  Past Medical History:  Diagnosis Date   Asthma    CAD (coronary artery disease)    a. 09/2013 NSTEMI/PCI: LM nl, LAD 40-50%, D1 100 (2.25 x 28 Vision BMS), LCX min irregs, RI 60-70, 30, RCA 40-50/50-3m/p, EF 45-50%.  b. cath 12/2014 -occulded BMS in diag, 50% pro to mid LAD, 60% ramus, 30% RCA    Chest pain 08/2015   Chronic diastolic CH[place] (congestive heart failure) [place]    a. 09/2014 EF 45-50% by LV gram;  b. 01/2014 Echo: EF 55-60%,  Gr 1 DD.   Coc[place] abuse ([facility]    [place]D ([place] obstructive pulmonary disease) [place]    [place]    "I had ~ 10 in each leg" (12/20/2016)   Essential hypertension    Hepatitis C    "clear free over a year now"   High cholesterol    Myocardial infarction ([facility] ~ 2014   Osteoarthritis    a. hands and toes.   Pneumonia    "several times" (12/20/2016)   [place] [place]    a. 2012 - s/p IVC filter;  b. prev on eliquis - noncompliant.   Shortness of breath dyspnea    Sinus headache    Squamous cell skin cancer 07/13/2016   "foot"   Tobacco abuse     Current Outpatient Medications on File Prior to Visit  Medication Sig Dispense Refill   albuterol (PROAIR HFA) 108 (90 Base) MCG/ACT inhaler Inhale 2 puffs into the lungs every 6 (six) hours as needed for wheezing or shortness of breath. 8.5 g 6   albuterol (PROVENTIL) (2.5 MG/3ML) 0.083% nebulizer solution Take 3 mLs (2.5 mg total) by nebulization every 6 (six) hours as needed for wheezing or shortness  of breath. 75 mL 3   apixaban (ELIQUIS) 5 MG TABS tablet TAKE 1 TABLET (5 MG TOTAL) BY MOUTH 2 (TWO) TIMES DAILY. 180 tablet 1   Azelastine-Fluticasone 137-50 MCG/ACT SUSP Place 1 spray into the nose every 12 (twelve) hours. 23 g 1   cetirizine (ZYRTEC) 10 MG tablet Take 1 tablet (10 mg total) by mouth daily. (Patient taking differently: Take 10 mg by mouth daily as needed for allergies.) 30 tablet 3   empagliflozin (JARDIANCE) 10 MG TABS tablet Take 1 tablet (10 mg total) by mouth daily before breakfast. 90 tablet 3   Evolocumab 140 MG/ML SOAJ INJECT 140 MG INTO THE SKIN EVERY 14 (FOURTEEN) DAYS. 6 mL 3   ezetimibe (ZETIA) 10 MG tablet TAKE 1 TABLET (10 MG TOTAL) BY MOUTH DAILY. 90 tablet 3   Fluticasone-Umeclidin-Vilant (TRELEGY ELLIPTA) 100-62.5-25 MCG/ACT AEPB Inhale 1 puff into the lungs daily. 60 each 4   furosemide (LASIX) 40 MG tablet Take 1 tablet (40 mg total) by mouth daily as needed (May take '40mg'$   Daily for Swelling and Shortness of breath). 90 tablet 1   gabapentin (NEURONTIN) 300 MG capsule Take 1 capsule (300 mg total) by mouth 2 (two) times daily. 60 capsule 6   isosorbide mononitrate (IMDUR) 30 MG 24 hr tablet Take 1 tablet (30 mg total) by mouth daily. 90 tablet 1   losartan (COZAAR) 25 MG tablet Take 1 tablet (25 mg total) by mouth daily. 90 tablet 3   methocarbamol (ROBAXIN) 500 MG tablet Take 1 tablet (500 mg total) by mouth every 8 (eight) hours as needed for muscle spasms. 90 tablet 1   metoprolol succinate (TOPROL-XL) 25 MG 24 hr tablet Take 1 tablet (25 mg total) by mouth daily. 90 tablet 1   nitroGLYCERIN (NITROSTAT) 0.4 MG SL tablet Place 1 tablet (0.4 mg total) under the tongue every 5 (five) minutes as needed for chest pain. 25 tablet 3   pantoprazole (PROTONIX) 40 MG tablet TAKE 1 TABLET (40 MG TOTAL) BY MOUTH DAILY. 30 tablet 2   [DISCONTINUED] rosuvastatin (CRESTOR) 5 MG tablet Take 1 tablet (5 mg total) by mouth daily. 30 tablet 6   No current facility-administered medications on file prior to visit.    Allergies  Allergen Reactions   Atorvastatin     myalgia     Assessment/Plan:  1. CHF -

## 2021-10-17 ENCOUNTER — Encounter: Payer: Medicare Other | Admitting: Gastroenterology

## 2021-10-20 DIAGNOSIS — Z20822 Contact with and (suspected) exposure to covid-19: Secondary | ICD-10-CM | POA: Diagnosis not present

## 2021-11-07 ENCOUNTER — Ambulatory Visit: Payer: Self-pay

## 2021-11-07 NOTE — Patient Outreach (Signed)
  Care Coordination   11/07/2021 Name: [name] MRN: [id number] DOB: June 05, 1960   Care Coordination Outreach Attempts:  An unsuccessful telephone outreach was attempted today to offer the patient information about available care coordination services as a benefit of their health plan.   Follow Up Plan:  Additional outreach attempts will be made to offer the patient care coordination information and services.   Encounter Outcome:  No Answer  Care Coordination Interventions Activated:  No   Care Coordination Interventions:  No, not indicated    Daneen Schick, BSW, CDP Social Worker, Certified Dementia Practitioner Care Coordination [phone]

## 2021-11-15 ENCOUNTER — Ambulatory Visit: Payer: Self-pay

## 2021-11-15 NOTE — Patient Outreach (Signed)
  Care Coordination   11/15/2021 Name: [name] MRN: [id number] DOB: Dec 12, 1960   Care Coordination Outreach Attempts:  A second unsuccessful outreach was attempted today to offer the patient with information about available care coordination services as a benefit of their health plan.     Follow Up Plan:  Additional outreach attempts will be made to offer the patient care coordination information and services.   Encounter Outcome:  No Answer  Care Coordination Interventions Activated:  No   Care Coordination Interventions:  No, not indicated    Daneen Schick, BSW, CDP Social Worker, Certified Dementia Practitioner Care Coordination [phone]

## 2021-11-16 ENCOUNTER — Other Ambulatory Visit: Payer: Self-pay

## 2021-11-23 ENCOUNTER — Ambulatory Visit: Payer: Self-pay

## 2021-11-23 NOTE — Patient Outreach (Signed)
  Care Coordination   11/23/2021 Name: [name] MRN: [id number] DOB: 08/19/1960   Care Coordination Outreach Attempts:  A third unsuccessful outreach was attempted today to offer the patient with information about available care coordination services as a benefit of their health plan.   Follow Up Plan:  No further outreach attempts will be made at this time. We have been unable to contact the patient to offer or enroll patient in care coordination services  Encounter Outcome:  No Answer  Care Coordination Interventions Activated:  No   Care Coordination Interventions:  No, not indicated    Daneen Schick, BSW, CDP Social Worker, Certified Dementia Practitioner Care Coordination [phone]

## 2021-12-06 ENCOUNTER — Encounter: Payer: Self-pay | Admitting: Pharmacist

## 2021-12-06 DIAGNOSIS — G720 Drug-induced myopathy: Secondary | ICD-10-CM

## 2021-12-06 NOTE — Progress Notes (Signed)
[facility])                                            [place] Team                                        Statin Quality Measure Assessment    12/06/2021  [name] 1961/03/10 [id number]  Per review of chart and payor information, patient has a diagnosis of cardiovascular disease but is not currently filling a statin prescription.  This places patient into the [facility] (Statin Use In Patients with Cardiovascular Disease) measure for CMS.    Patient has documented trials of atorvastatin and rosuvastatin  with reported myopathy, but no corresponding CPT codes that would exclude patient from [facility] measure. He was prescribed Repatha (it was last filled 02/23)     Component Value Date/Time   CHOL 168 08/13/2020 1105   TRIG 104 08/13/2020 1105   HDL 69 08/13/2020 1105   CHOLHDL 2.4 08/13/2020 1105   CHOLHDL 3.1 01/01/2015 0208   VLDL 12 01/01/2015 0208   LDLCALC 80 08/13/2020 1105     Please consider ONE of the following recommendations:  Initiate high intensity statin Atorvastatin '40mg'$  once daily, #90, 3 refills   Rosuvastatin '20mg'$  once daily, #90, 3 refills    Initiate moderate intensity  statin with reduced frequency if prior  statin intolerance 1x weekly, #13, 3 refills   2x weekly, #26, 3 refills   3x weekly, #39, 3 refills    Code for past statin intolerance  (required annually)   Provider Requirements:  Must asociate code during an office visit or telehealth encounter   Drug Induced Myopathy G72.0   Myalgia M79.1   Myositis, unspecified M60.9   Myopathy, unspecified G72.9   Rhabdomyolysis M62.82     Plan: Route note PCP prior to upcoming appointment.   Elayne Guerin, PharmD, [place] Clinical Pharmacist [phone]

## 2021-12-07 ENCOUNTER — Ambulatory Visit: Payer: Medicare Other | Attending: Family Medicine | Admitting: Family Medicine

## 2021-12-07 ENCOUNTER — Other Ambulatory Visit: Payer: Self-pay

## 2021-12-07 ENCOUNTER — Encounter: Payer: Self-pay | Admitting: Family Medicine

## 2021-12-07 VITALS — BP 130/81 | HR 77 | Temp 98.0°F | Ht 74.0 in | Wt 180.8 lb

## 2021-12-07 DIAGNOSIS — I110 Hypertensive heart disease with heart failure: Secondary | ICD-10-CM

## 2021-12-07 DIAGNOSIS — I5042 Chronic combined systolic (congestive) and diastolic (congestive) heart failure: Secondary | ICD-10-CM | POA: Diagnosis not present

## 2021-12-07 DIAGNOSIS — Z86711 Personal history of pulmonary embolism: Secondary | ICD-10-CM | POA: Diagnosis not present

## 2021-12-07 DIAGNOSIS — Z131 Encounter for screening for diabetes mellitus: Secondary | ICD-10-CM | POA: Diagnosis not present

## 2021-12-07 DIAGNOSIS — M48061 Spinal stenosis, lumbar region without neurogenic claudication: Secondary | ICD-10-CM | POA: Diagnosis not present

## 2021-12-07 DIAGNOSIS — J3089 Other allergic rhinitis: Secondary | ICD-10-CM | POA: Diagnosis not present

## 2021-12-07 DIAGNOSIS — I2510 Atherosclerotic heart disease of native coronary artery without angina pectoris: Secondary | ICD-10-CM

## 2021-12-07 DIAGNOSIS — I10 Essential (primary) hypertension: Secondary | ICD-10-CM

## 2021-12-07 DIAGNOSIS — J438 Other emphysema: Secondary | ICD-10-CM | POA: Diagnosis not present

## 2021-12-07 DIAGNOSIS — F17210 Nicotine dependence, cigarettes, uncomplicated: Secondary | ICD-10-CM | POA: Diagnosis not present

## 2021-12-07 DIAGNOSIS — M5416 Radiculopathy, lumbar region: Secondary | ICD-10-CM

## 2021-12-07 DIAGNOSIS — Z23 Encounter for immunization: Secondary | ICD-10-CM

## 2021-12-07 DIAGNOSIS — T466X5A Adverse effect of antihyperlipidemic and antiarteriosclerotic drugs, initial encounter: Secondary | ICD-10-CM | POA: Diagnosis not present

## 2021-12-07 DIAGNOSIS — G720 Drug-induced myopathy: Secondary | ICD-10-CM | POA: Diagnosis not present

## 2021-12-07 MED ORDER — AZELASTINE-FLUTICASONE 137-50 MCG/ACT NA SUSP
1.0000 | Freq: Two times a day (BID) | NASAL | 1 refills | Status: DC
  Filled 2021-12-07: qty 23, 30d supply, fill #0

## 2021-12-07 MED ORDER — CETIRIZINE HCL 10 MG PO TABS
10.0000 mg | ORAL_TABLET | Freq: Every day | ORAL | 3 refills | Status: DC
  Filled 2021-12-07: qty 30, 30d supply, fill #0

## 2021-12-07 NOTE — Patient Instructions (Signed)
Allergic Rhinitis, Adult  Allergic rhinitis is an allergic reaction that affects the mucous membrane inside the nose. The mucous membrane is the tissue that produces mucus. There are two types of allergic rhinitis: Seasonal. This type is also called hay fever and happens only during certain seasons. Perennial. This type can happen at any time of the year. Allergic rhinitis cannot be spread from person to person. This condition can be mild, moderate, or severe. It can develop at any age and may be outgrown. What are the causes? This condition is caused by allergens. These are things that can cause an allergic reaction. Allergens may differ for seasonal allergic rhinitis and perennial allergic rhinitis. Seasonal allergic rhinitis is triggered by pollen. Pollen can come from grasses, trees, and weeds. Perennial allergic rhinitis may be triggered by: Dust mites. Proteins in a pet's urine, saliva, or dander. Dander is dead skin cells from a pet. Smoke, mold, or car fumes. What increases the risk? You are more likely to develop this condition if you have a family history of allergies or other conditions related to allergies, including: Allergic conjunctivitis. This is inflammation of parts of the eyes and eyelids. Asthma. This condition affects the lungs and makes it hard to breathe. Atopic dermatitis or eczema. This is long term (chronic) inflammation of the skin. Food allergies. What are the signs or symptoms? Symptoms of this condition include: Sneezing or coughing. A stuffy nose (nasal congestion), itchy nose, or nasal discharge. Itchy eyes and tearing of the eyes. A feeling of mucus dripping down the back of your throat (postnasal drip). Trouble sleeping. Tiredness or fatigue. Headache. Sore throat. How is this diagnosed? This condition may be diagnosed with your symptoms, medical history, and physical exam. Your health care provider may check for related conditions, such  as: Asthma. Pink eye. This is eye inflammation caused by infection (conjunctivitis). Ear infection. Upper respiratory infection. This is an infection in the nose, throat, or upper airways. You may also have tests to find out which allergens trigger your symptoms. These may include skin tests or blood tests. How is this treated? There is no cure for this condition, but treatment can help control symptoms. Treatment may include: Taking medicines that block allergy symptoms, such as corticosteroids and antihistamines. Medicine may be given as a shot, nasal spray, or pill. Avoiding any allergens. Being exposed again and again to tiny amounts of allergens to help you build a defense against allergens (immunotherapy). This is done if other treatments have not helped. It may include: Allergy shots. These are injected medicines that have small amounts of allergen in them. Sublingual immunotherapy. This involves taking small doses of a medicine with allergen in it under your tongue. If these treatments do not work, your health care provider may prescribe newer, stronger medicines. Follow these instructions at home: Avoiding allergens Find out what you are allergic to and avoid those allergens. These are some things you can do to help avoid allergens: If you have perennial allergies: Replace carpet with wood, tile, or vinyl flooring. Carpet can trap dander and dust. Do not smoke. Do not allow smoking in your home. Change your heating and air conditioning filters at least once a month. If you have seasonal allergies, take these steps during allergy season: Keep windows closed as much as possible. Plan outdoor activities when pollen counts are lowest. Check pollen counts before you plan outdoor activities. When coming indoors, change clothing and shower before sitting on furniture or bedding. If you have a pet   in the house that produces allergens: Keep the pet out of the bedroom. Vacuum, sweep, and  dust regularly. General instructions Take over-the-counter and prescription medicines only as told by your health care provider. Drink enough fluid to keep your urine pale yellow. Keep all follow-up visits as told by your health care provider. This is important. Where to find more information American Academy of Allergy, Asthma & Immunology: www.aaaai.org Contact a health care provider if: You have a fever. You develop a cough that does not go away. You make whistling sounds when you breathe (wheeze). Your symptoms slow you down or stop you from doing your normal activities each day. Get help right away if: You have shortness of breath. This symptom may represent a serious problem that is an emergency. Do not wait to see if the symptom will go away. Get medical help right away. Call your local emergency services (911 in the [place] Do not drive yourself to the hospital. Summary Allergic rhinitis may be managed by taking medicines as directed and avoiding allergens. If you have seasonal allergies, keep windows closed as much as possible during allergy season. Contact your health care provider if you develop a fever or a cough that does not go away. This information is not intended to replace advice given to you by your health care provider. Make sure you discuss any questions you have with your health care provider. Document Revised: 05/02/2019 Document Reviewed: 03/11/2019 Elsevier Patient Education  [place].

## 2021-12-07 NOTE — Progress Notes (Signed)
Dark circle under eyes Sinus.

## 2021-12-07 NOTE — Progress Notes (Signed)
Subjective:  Patient ID: [name], male    DOB: April 23, 1960  Age: 61 y.o. MRN: [id number]  CC: Hypertension   HPI [name] is a 61 y.o. year old male with a history of COPD, chronic sinusitis, recurrent DVT and PE (on anticoagulation with Eliquis), coronary artery disease (status post stent, last cardiac cath from 12/2014 revealed two-vessel CAD, medical management recommended),  (EF 40-45% from 01/2021),tobacco abuse, status post posterior lumbar interbody fusion in 10/2020.  Interval History: He was seen by Dr. Havery Moros 3 months ago for follow-up of weight loss (of about 35 pounds) on CT abdominal pelvis ordered which he never followed through with.  Notes also revealed plans for EGD/colonoscopy. He did have a visit with the CHF pharmacist 3 months ago for optimization of heart failure medications and per notes compliance has been an issue.  Losartan and Jardiance were initiated at that time. Endorses adherence with his antihypertensive. He has not taken his Repatha in 3 months.  He states his appetite is improving. He has not been attending rehab as he had bed bugs and plans to call rehab to reschedule.Still has difficulty rising from a seating position  Complains of sneezing a lot with associated nasal congestion. States his nasal spray is not effective and neither is his antihistamine. He has also noticed darkening beneath his eyelids x6 months. Past Medical History:  Diagnosis Date   Asthma    CAD (coronary artery disease)    a. 09/2013 NSTEMI/PCI: LM nl, LAD 40-50%, D1 100 (2.25 x 28 Vision BMS), LCX min irregs, RI 60-70, 30, RCA 40-50/50-85m/p, EF 45-50%.  b. cath 12/2014 -occulded BMS in diag, 50% pro to mid LAD, 60% ramus, 30% RCA    Chest pain 08/2015   Chronic diastolic CHF (congestive heart failure) ([facility]    a. 09/2014 EF 45-50% by LV gram;  b. 01/2014 Echo: EF 55-60%, Gr 1 DD.   Cocaine abuse ([facility]    COPD (chronic obstructive pulmonary disease) ([facility]     DVT (deep venous thrombosis) ([facility]    "I had ~ 10 in each leg" (12/20/2016)   Essential hypertension    Hepatitis C    "clear free over a year now"   High cholesterol    Myocardial infarction ([facility] ~ 2014   Osteoarthritis    a. hands and toes.   Pneumonia    "several times" (12/20/2016)   Pulmonary embolism ([facility]    a. 2012 - s/p IVC filter;  b. prev on eliquis - noncompliant.   Shortness of breath dyspnea    Sinus headache    Squamous cell skin cancer 07/13/2016   "foot"   Tobacco abuse     Past Surgical History:  Procedure Laterality Date   BACK SURGERY  2022   CARDIAC CATHETERIZATION N/A 01/04/2015   Procedure: Left Heart Cath and Coronary Angiography;  Surgeon: CBurnell Blanks MD;  [facility];  Service: Cardiovascular;  Laterality: N/A;   CORONARY ANGIOPLASTY WITH STENT PLACEMENT  10/21/2013   BMS to D1   CYST EXCISION N/A 05/10/2016   Procedure: EXCISION OF SEBACEOUS CYST UPPER BACK;  Surgeon: MDonnie Mesa MD;  [place] [facility]  Service: General;  Laterality: N/A;   FOOT SURGERY Right ~ 06/2016   "said there was skin cancer on it"   LEFT HEART CATHETERIZATION WITH CORONARY ANGIOGRAM N/A 10/21/2013   Procedure: LEFT HEART CATHETERIZATION WITH CORONARY ANGIOGRAM;  Surgeon: DLeonie Man MD;  [facility];  Service:  Cardiovascular;  Laterality: N/A;   VENA CAVA FILTER PLACEMENT  ~ 2007-~ 2017   "1 in my neck; 2 in my wrist"    Family History  Problem Relation Age of Onset   Asthma Mother    Allergies Mother    Allergies Sister    Allergies Brother    Deep vein thrombosis Brother        two brothers with recurrent DVT   Heart attack Father        a.60s b. deceased in his 41s   Coronary artery disease Father    Colon cancer Neg Hx    Liver cancer Neg Hx    Esophageal cancer Neg Hx    Colon polyps Neg Hx    Rectal cancer Neg Hx    Pancreatic cancer Neg Hx    Stomach cancer Neg Hx     Social History   Socioeconomic History    Marital status: Single    Spouse name: Not on file   Number of children: Not on file   Years of education: Not on file   Highest education level: Not on file  Occupational History   Occupation: unemployed  Tobacco Use   Smoking status: Every Day    Packs/day: 0.25    Years: 39.00    Total pack years: 9.75    Types: Cigarettes, Cigars   Smokeless tobacco: Never  Vaping Use   Vaping Use: Never used  Substance and Sexual Activity   Alcohol use: Yes    Alcohol/week: 6.0 standard drinks of alcohol    Types: 2 Cans of beer, 4 Shots of liquor per week   Drug use: Not Currently    Types: Cocaine    Comment: 12/20/2016 "might have used some the other day; I'm not sure"   Sexual activity: Not Currently  Other Topics Concern   Not on file  Social History Narrative   Lives in [place].   Social Determinants of Health   Financial Resource Strain: Not on file  Food Insecurity: Not on file  Transportation Needs: Unmet Transportation Needs (04/07/2021)   PRAPARE - Hydrologist (Medical): Yes    Lack of Transportation (Non-Medical): Yes  Physical Activity: Not on file  Stress: Not on file  Social Connections: Not on file    Allergies  Allergen Reactions   Atorvastatin     myalgia    Outpatient Medications Prior to Visit  Medication Sig Dispense Refill   albuterol (PROAIR HFA) 108 (90 Base) MCG/ACT inhaler Inhale 2 puffs into the lungs every 6 (six) hours as needed for wheezing or shortness of breath. 8.5 g 6   albuterol (PROVENTIL) (2.5 MG/3ML) 0.083% nebulizer solution Take 3 mLs (2.5 mg total) by nebulization every 6 (six) hours as needed for wheezing or shortness of breath. 75 mL 3   apixaban (ELIQUIS) 5 MG TABS tablet TAKE 1 TABLET (5 MG TOTAL) BY MOUTH 2 (TWO) TIMES DAILY. 180 tablet 1   empagliflozin (JARDIANCE) 10 MG TABS tablet Take 1 tablet (10 mg total) by mouth daily before breakfast. 90 tablet 3   Evolocumab 140 MG/ML SOAJ INJECT 140 MG INTO  THE SKIN EVERY 14 (FOURTEEN) DAYS. 6 mL 3   ezetimibe (ZETIA) 10 MG tablet TAKE 1 TABLET (10 MG TOTAL) BY MOUTH DAILY. 90 tablet 3   Fluticasone-Umeclidin-Vilant (TRELEGY ELLIPTA) 100-62.5-25 MCG/ACT AEPB Inhale 1 puff into the lungs daily. 60 each 4   furosemide (LASIX) 40 MG tablet Take 1 tablet (40 mg  total) by mouth daily as needed (May take 72m Daily for Swelling and Shortness of breath). 90 tablet 1   gabapentin (NEURONTIN) 300 MG capsule Take 1 capsule (300 mg total) by mouth 2 (two) times daily. 60 capsule 6   isosorbide mononitrate (IMDUR) 30 MG 24 hr tablet Take 1 tablet (30 mg total) by mouth daily. 90 tablet 1   losartan (COZAAR) 25 MG tablet Take 1 tablet (25 mg total) by mouth daily. 90 tablet 3   methocarbamol (ROBAXIN) 500 MG tablet Take 1 tablet (500 mg total) by mouth every 8 (eight) hours as needed for muscle spasms. 90 tablet 1   nitroGLYCERIN (NITROSTAT) 0.4 MG SL tablet Place 1 tablet (0.4 mg total) under the tongue every 5 (five) minutes as needed for chest pain. 25 tablet 3   pantoprazole (PROTONIX) 40 MG tablet TAKE 1 TABLET (40 MG TOTAL) BY MOUTH DAILY. 30 tablet 2   Azelastine-Fluticasone 137-50 MCG/ACT SUSP Place 1 spray into the nose every 12 (twelve) hours. 23 g 1   cetirizine (ZYRTEC) 10 MG tablet Take 1 tablet (10 mg total) by mouth daily. (Patient taking differently: Take 10 mg by mouth daily as needed for allergies.) 30 tablet 3   metoprolol succinate (TOPROL-XL) 25 MG 24 hr tablet Take 1 tablet (25 mg total) by mouth daily. 90 tablet 1   No facility-administered medications prior to visit.     ROS Review of Systems  Constitutional:  Negative for activity change and appetite change.  HENT:  Negative for sinus pressure and sore throat.   Respiratory:  Negative for chest tightness, shortness of breath and wheezing.   Cardiovascular:  Negative for chest pain and palpitations.  Gastrointestinal:  Negative for abdominal distention, abdominal pain and  constipation.  Genitourinary: Negative.   Musculoskeletal:        See HPI  Skin:  Positive for color change.  Psychiatric/Behavioral:  Negative for behavioral problems and dysphoric mood.     Objective:  BP 130/81   Pulse 77   Temp 98 F (36.7 C) (Oral)   Ht _0  (1.88 m)   Wt 180 lb 12.8 oz (82 kg)   SpO2 100%   BMI 23.21 kg/m      12/07/2021    8:42 AM 09/06/2021    8:50 AM 09/01/2021   11:40 AM  BP/Weight  Systolic BP [id number] Diastolic BP 81 82 82  Wt. (Lbs) 180.8 180.13 187  BMI 23.21 kg/m2 23.13 kg/m2 24.01 kg/m2      Physical Exam Constitutional:      Appearance: He is well-developed.  Cardiovascular:     Rate and Rhythm: Normal rate.     Heart sounds: Normal heart sounds. No murmur heard. Pulmonary:     Effort: Pulmonary effort is normal.     Breath sounds: Normal breath sounds. No wheezing or rales.  Chest:     Chest wall: No tenderness.  Abdominal:     General: Bowel sounds are normal. There is no distension.     Palpations: Abdomen is soft. There is no mass.     Tenderness: There is no abdominal tenderness.  Musculoskeletal:        General: Normal range of motion.     Right lower leg: No edema.     Left lower leg: No edema.  Skin:    Comments: Slight bilateral infraorbital hyperpigmentation  Neurological:     Mental Status: He is alert and oriented to person, place, and time.  Psychiatric:  Mood and Affect: Mood normal.        Latest Ref Rng & Units 09/06/2021    9:35 AM 11/02/2020    8:38 AM 08/13/2020   11:02 AM  CMP  Glucose 70 - 99 mg/dL 85  114    BUN 6 - 23 mg/dL 7  10    Creatinine 0.40 - 1.50 mg/dL 0.60  0.76    Sodium 135 - 145 mEq/L 138  136    Potassium 3.5 - 5.1 mEq/L 3.9  3.5    Chloride 96 - 112 mEq/L 104  106    CO2 19 - 32 mEq/L 26  21    Calcium 8.4 - 10.5 mg/dL 9.5  9.1    Total Protein 6.0 - 8.3 g/dL 8.1   7.8   Total Bilirubin 0.2 - 1.2 mg/dL 0.5   <0.2   Alkaline Phos 39 - 117 U/L 136   122   AST 0 - 37  U/L 18   18   ALT 0 - 53 U/L 13   25     Lipid Panel     Component Value Date/Time   CHOL 168 08/13/2020 1105   TRIG 104 08/13/2020 1105   HDL 69 08/13/2020 1105   CHOLHDL 2.4 08/13/2020 1105   CHOLHDL 3.1 01/01/2015 0208   VLDL 12 01/01/2015 0208   LDLCALC 80 08/13/2020 1105    CBC    Component Value Date/Time   WBC 4.6 09/06/2021 0935   RBC 4.29 09/06/2021 0935   HGB 13.9 09/06/2021 0935   HGB 13.5 12/01/2020 1123   HCT 42.5 09/06/2021 0935   HCT 39.3 12/01/2020 1123   PLT 164.0 09/06/2021 0935   PLT 174 12/01/2020 1123   MCV 99.1 09/06/2021 0935   MCV 89 12/01/2020 1123   MCH 30.7 12/01/2020 1123   MCH 30.9 11/02/2020 0838   MCHC 32.7 09/06/2021 0935   RDW 14.7 09/06/2021 0935   RDW 12.9 12/01/2020 1123   LYMPHSABS 1.9 09/06/2021 0935   LYMPHSABS 1.2 12/01/2020 1123   MONOABS 0.4 09/06/2021 0935   EOSABS 0.6 09/06/2021 0935   EOSABS 0.3 12/01/2020 1123   BASOSABS 0.0 09/06/2021 0935   BASOSABS 0.0 12/01/2020 1123    Lab Results  Component Value Date   HGBA1C 5.9 (H) 08/09/2020    Assessment & Plan:  1. Non-seasonal allergic rhinitis due to other allergic trigger Uncontrolled on nasal steroid and antihistamine He would like to be referred for immunotherapy and referral has been placed - Ambulatory referral to Allergy - cetirizine (ZYRTEC) 10 MG tablet; Take 1 tablet (10 mg total) by mouth daily.  Dispense: 30 tablet; Refill: 3  2. Other emphysema (HCC) Stable Continue current inhalers  3. Hypertensive heart disease with chronic combined systolic and diastolic congestive heart failure (HCC) EF of 40 to 45% Euvolemic Continue GDMT - CMP14+EGFR  4. History of pulmonary embolism Currently anticoagulation with Eliquis  5. Spinal stenosis of lumbar region with radiculopathy Status postsurgery He continues to experience weakness in his legs Has not been compliant with physical therapy but plans to reschedule  6. Screening for diabetes mellitus -  Hemoglobin A1c  7. Coronary artery disease involving native coronary artery of native heart without angina pectoris Non adherent with Repatha Statin intolerance - LP+Non-HDL Cholesterol  8. Need for immunization against influenza - Flu Vaccine QUAD 53moIM (Fluarix, Fluzone & Alfiuria Quad PF)    Meds ordered this encounter  Medications   cetirizine (ZYRTEC) 10 MG tablet  Sig: Take 1 tablet (10 mg total) by mouth daily.    Dispense:  30 tablet    Refill:  3   Azelastine-Fluticasone 137-50 MCG/ACT SUSP    Sig: Place 1 spray into the nose every 12 (twelve) hours.    Dispense:  23 g    Refill:  1    Follow-up: Return in about 6 months (around 06/07/2022) for Chronic medical conditions.       Charlott Rakes, MD, FAAFP. [facility], [place]   12/07/2021, 12:08 PM

## 2021-12-08 LAB — CMP14+EGFR
ALT: 16 IU/L (ref 0–44)
AST: 19 IU/L (ref 0–40)
Albumin/Globulin Ratio: 1 — ABNORMAL LOW (ref 1.2–2.2)
Albumin: 3.7 g/dL — ABNORMAL LOW (ref 3.9–4.9)
Alkaline Phosphatase: 119 IU/L (ref 44–121)
BUN/Creatinine Ratio: 13 (ref 10–24)
BUN: 9 mg/dL (ref 8–27)
Bilirubin Total: 0.3 mg/dL (ref 0.0–1.2)
CO2: 21 mmol/L (ref 20–29)
Calcium: 9.2 mg/dL (ref 8.6–10.2)
Chloride: 108 mmol/L — ABNORMAL HIGH (ref 96–106)
Creatinine, Ser: 0.69 mg/dL — ABNORMAL LOW (ref 0.76–1.27)
Globulin, Total: 3.8 g/dL (ref 1.5–4.5)
Glucose: 87 mg/dL (ref 70–99)
Potassium: 4.1 mmol/L (ref 3.5–5.2)
Sodium: 144 mmol/L (ref 134–144)
Total Protein: 7.5 g/dL (ref 6.0–8.5)
eGFR: 105 mL/min/{1.73_m2} (ref >59)

## 2021-12-08 LAB — LP+NON-HDL CHOLESTEROL
Cholesterol, Total: 218 mg/dL — ABNORMAL HIGH (ref 100–199)
HDL: 61 mg/dL (ref >39)
LDL Chol Calc (NIH): 139 mg/dL — ABNORMAL HIGH (ref 0–99)
Total Non-HDL-Chol (LDL+VLDL): 157 mg/dL — ABNORMAL HIGH (ref 0–129)
Triglycerides: 99 mg/dL (ref 0–149)
VLDL Cholesterol Cal: 18 mg/dL (ref 5–40)

## 2021-12-08 LAB — HEMOGLOBIN A1C
Est. average glucose Bld gHb Est-mCnc: 120 mg/dL
Hgb A1c MFr Bld: 5.8 % — ABNORMAL HIGH (ref 4.8–5.6)

## 2021-12-09 ENCOUNTER — Other Ambulatory Visit: Payer: Self-pay

## 2021-12-13 ENCOUNTER — Other Ambulatory Visit: Payer: Self-pay | Admitting: Family Medicine

## 2021-12-13 ENCOUNTER — Other Ambulatory Visit: Payer: Self-pay

## 2021-12-13 DIAGNOSIS — M48061 Spinal stenosis, lumbar region without neurogenic claudication: Secondary | ICD-10-CM

## 2021-12-14 ENCOUNTER — Other Ambulatory Visit: Payer: Self-pay

## 2021-12-14 MED ORDER — GABAPENTIN 300 MG PO CAPS
300.0000 mg | ORAL_CAPSULE | Freq: Two times a day (BID) | ORAL | 6 refills | Status: DC
  Filled 2021-12-14: qty 60, 30d supply, fill #0
  Filled 2022-03-28: qty 60, 30d supply, fill #1

## 2021-12-15 ENCOUNTER — Other Ambulatory Visit: Payer: Self-pay

## 2022-01-18 ENCOUNTER — Encounter: Payer: Self-pay | Admitting: Podiatry

## 2022-01-18 ENCOUNTER — Ambulatory Visit (INDEPENDENT_AMBULATORY_CARE_PROVIDER_SITE_OTHER): Payer: Medicare Other | Admitting: Podiatry

## 2022-01-18 DIAGNOSIS — M79675 Pain in left toe(s): Secondary | ICD-10-CM | POA: Diagnosis not present

## 2022-01-18 DIAGNOSIS — B351 Tinea unguium: Secondary | ICD-10-CM | POA: Diagnosis not present

## 2022-01-18 DIAGNOSIS — M79674 Pain in right toe(s): Secondary | ICD-10-CM | POA: Diagnosis not present

## 2022-01-18 DIAGNOSIS — Z7901 Long term (current) use of anticoagulants: Secondary | ICD-10-CM

## 2022-01-18 NOTE — Progress Notes (Signed)
This patient returns to my office for at risk foot care.  This patient requires this care by a professional since this patient will be at risk due to having coagulation defect. Patient is taking eliquis.  This patient is unable to cut nails himself since the patient cannot reach his nails.These nails are painful walking and wearing shoes.  This patient presents for at risk foot care today.  General Appearance  Alert, conversant and in no acute stress.  Vascular  Dorsalis pedis and posterior tibial  pulses are palpable  bilaterally.  Capillary return is within normal limits  bilaterally. Temperature is within normal limits  bilaterally.  Neurologic  Senn-Weinstein monofilament wire test within normal limits  bilaterally. Muscle power within normal limits bilaterally.  Nails Thick disfigured discolored nails with subungual debris  from hallux to fifth toes bilaterally. No evidence of bacterial infection or drainage bilaterally.  Orthopedic  No limitations of motion  feet .  No crepitus or effusions noted.  No bony pathology or digital deformities noted.  Skin  normotropic skin with no porokeratosis noted bilaterally.  No signs of infections or ulcers noted.     Onychomycosis  Pain in right toes  Pain in left toes  Consent was obtained for treatment procedures.   Mechanical debridement of nails 1-5  bilaterally performed with a nail nipper.  Filed with dremel without incident.    Return office visit    3    months                  Told patient to return for periodic foot care and evaluation due to potential at risk complications.   Gardiner Barefoot DPM

## 2022-01-23 NOTE — Progress Notes (Unsigned)
NEW PATIENT Date of Service/Encounter:  01/25/22 Referring provider: Charlott Rakes, MD Primary care provider: Charlott Rakes, MD  Subjective:  [name] is a 61 y.o. male with a PMHx of COPD, chronic sinusitis, recurrent DVT and PE (on Eliquis), coronary artery disease (s/p stent, decreased EF 40-45%),tobacco abuse, status post posterior lumbar interbody fusion in 10/2020 presenting today for evaluation of chronic rhinitis. History obtained from: chart review and patient.   Chronic rhinitis: started when he was around 61 yo Symptoms include: nasal congestion and sneezing  Occurs year-round Potential triggers: changes from warm to cold weather Treatments tried: azelastine-fluticasone, zyrtec - not using regularly, doesn't find them effective Previous allergy testing: no History of reflux/heartburn:  occasionally but not often, maybe once or twice a month , prescribed pantoprazole 40 mg daily Previous ENT visits-none Sinus surgeries, ear or tonsils/adenoids: none  Additionally, he has COPD. Not currently managed by Pulmonary.  Takes Trelegy 100, 1 puff daily-only using as needed.  Albuterol using only as needed.  He states very infrequently.  Has not needed in months. 2018 full pft: Conclusions: The diffusion defect and reduced lung volumes suggest an early parenchymal process. Mild airway obstruction is present. He is an active smoker, not ready to quit.  Chart Review:  - PCP visit 12/07/21: "Complains of sneezing a lot with associated nasal congestion. States his nasal spray is not effective and neither is his antihistamine. He has also noticed darkening beneath his eyelids x6 months"  Other allergy screening: Food allergy: no Medication allergy: no Hymenoptera allergy: no Urticaria: no Eczema:no History of recurrent infections suggestive of immunodeficency: no Vaccinations are up to date.   Past Medical History: Past Medical History:  Diagnosis Date   Asthma     CAD (coronary artery disease)    a. 09/2013 NSTEMI/PCI: LM nl, LAD 40-50%, D1 100 (2.25 x 28 Vision BMS), LCX min irregs, RI 60-70, 30, RCA 40-50/50-32m/p, EF 45-50%.  b. cath 12/2014 -occulded BMS in diag, 50% pro to mid LAD, 60% ramus, 30% RCA    Chest pain 08/2015   Chronic diastolic CHF (congestive heart failure) ([facility]    a. 09/2014 EF 45-50% by LV gram;  b. 01/2014 Echo: EF 55-60%, Gr 1 DD.   Cocaine abuse ([facility]    COPD (chronic obstructive pulmonary disease) ([facility]    DVT (deep venous thrombosis) ([facility]    "I had ~ 10 in each leg" (12/20/2016)   Essential hypertension    Hepatitis C    "clear free over a year now"   High cholesterol    Myocardial infarction ([facility] ~ 2014   Osteoarthritis    a. hands and toes.   Pneumonia    "several times" (12/20/2016)   Pulmonary embolism ([facility]    a. 2012 - s/p IVC filter;  b. prev on eliquis - noncompliant.   Shortness of breath dyspnea    Sinus headache    Squamous cell skin cancer 07/13/2016   "foot"   Tobacco abuse    Medication List:  Current Outpatient Medications  Medication Sig Dispense Refill   albuterol (PROAIR HFA) 108 (90 Base) MCG/ACT inhaler Inhale 2 puffs into the lungs every 6 (six) hours as needed for wheezing or shortness of breath. 8.5 g 6   albuterol (PROVENTIL) (2.5 MG/3ML) 0.083% nebulizer solution Take 3 mLs (2.5 mg total) by nebulization every 6 (six) hours as needed for wheezing or shortness of breath. 75 mL 3   apixaban (ELIQUIS) 5 MG TABS tablet TAKE 1 TABLET (5  MG TOTAL) BY MOUTH 2 (TWO) TIMES DAILY. 180 tablet 1   Azelastine-Fluticasone 137-50 MCG/ACT SUSP Place 1 spray into the nose every 12 (twelve) hours. (Patient taking differently: Place 1 spray into the nose as needed (congestion).) 23 g 1   cetirizine (ZYRTEC) 10 MG tablet Take 1 tablet (10 mg total) by mouth daily. 30 tablet 3   empagliflozin (JARDIANCE) 10 MG TABS tablet Take 1 tablet (10 mg total) by mouth daily before breakfast. 90 tablet 3   Evolocumab 140  MG/ML SOAJ INJECT 140 MG INTO THE SKIN EVERY 14 (FOURTEEN) DAYS. 6 mL 3   ezetimibe (ZETIA) 10 MG tablet TAKE 1 TABLET (10 MG TOTAL) BY MOUTH DAILY. 90 tablet 3   Fluticasone-Umeclidin-Vilant (TRELEGY ELLIPTA) 100-62.5-25 MCG/ACT AEPB Inhale 1 puff into the lungs daily. 60 each 4   furosemide (LASIX) 40 MG tablet Take 1 tablet (40 mg total) by mouth daily as needed (May take '40mg'$  Daily for Swelling and Shortness of breath). 90 tablet 1   gabapentin (NEURONTIN) 300 MG capsule Take 1 capsule (300 mg total) by mouth 2 (two) times daily. 60 capsule 6   isosorbide mononitrate (IMDUR) 30 MG 24 hr tablet Take 1 tablet (30 mg total) by mouth daily. 90 tablet 1   losartan (COZAAR) 25 MG tablet Take 1 tablet (25 mg total) by mouth daily. 90 tablet 3   nitroGLYCERIN (NITROSTAT) 0.4 MG SL tablet Place 1 tablet (0.4 mg total) under the tongue every 5 (five) minutes as needed for chest pain. 25 tablet 3   pantoprazole (PROTONIX) 40 MG tablet TAKE 1 TABLET (40 MG TOTAL) BY MOUTH DAILY. 30 tablet 2   No current facility-administered medications for this visit.   Known Allergies:  Allergies  Allergen Reactions   Atorvastatin     myalgia   Past Surgical History: Past Surgical History:  Procedure Laterality Date   BACK SURGERY  2022   CARDIAC CATHETERIZATION N/A 01/04/2015   Procedure: Left Heart Cath and Coronary Angiography;  Surgeon: Burnell Blanks, MD;  Location: [place] [facility];  Service: Cardiovascular;  Laterality: N/A;   CORONARY ANGIOPLASTY WITH STENT PLACEMENT  10/21/2013   BMS to D1   CYST EXCISION N/A 05/10/2016   Procedure: EXCISION OF SEBACEOUS CYST UPPER BACK;  Surgeon: Donnie Mesa, MD;  Location: [place];  Service: General;  Laterality: N/A;   FOOT SURGERY Right ~ 06/2016   "said there was skin cancer on it"   LEFT HEART CATHETERIZATION WITH CORONARY ANGIOGRAM N/A 10/21/2013   Procedure: LEFT HEART CATHETERIZATION WITH CORONARY ANGIOGRAM;  Surgeon: Leonie Man, MD;   Location: [facility] CATH LAB;  Service: Cardiovascular;  Laterality: N/A;   VENA CAVA FILTER PLACEMENT  ~ 2007-~ 2017   "1 in my neck; 2 in my wrist"   Family History: Family History  Problem Relation Age of Onset   Asthma Mother    Allergies Mother    Allergies Sister    Allergies Brother    Deep vein thrombosis Brother        two brothers with recurrent DVT   Heart attack Father        a.60s b. deceased in his 83s   Coronary artery disease Father    Colon cancer Neg Hx    Liver cancer Neg Hx    Esophageal cancer Neg Hx    Colon polyps Neg Hx    Rectal cancer Neg Hx    Pancreatic cancer Neg Hx    Stomach cancer Neg Hx  Social History: Huzaifa lives in a townhouse without water damage, wood floors, electric heating, central AC, no cockroaches, not using dust mite protection on the bedding or pillows, no smoke exposure.  No HEPA filter in the home.  [place].  He smokes one half a pack per day for 45 years.   ROS:  All other systems negative except as noted per HPI.  Objective:  Blood pressure 104/60, pulse 68, temperature (!) 96.7 F (35.9 C), temperature source Temporal, resp. rate 18, height 5' 11.5" (1.816 m), weight 180 lb (81.6 kg), SpO2 100 %. Body mass index is 24.76 kg/m. Physical Exam:  General Appearance:  Alert, cooperative, no distress, appears stated age  Head:  Normocephalic, without obvious abnormality, atraumatic  Eyes:  Conjunctiva clear, EOM's intact  Nose: Nares normal, hypertrophic turbinates, normal mucosa, no visible anterior polyps, and septum midline  Throat: Lips, tongue normal; teeth and gums normal, normal posterior oropharynx  Neck: Supple, symmetrical  Lungs:   clear to auscultation bilaterally, Respirations unlabored, no coughing  Heart:  regular rate and rhythm and no murmur, Appears well perfused  Extremities: No edema  Skin: Skin color, texture, turgor normal, no rashes or lesions on visualized portions of  skin  Neurologic: No gross deficits     Diagnostics: Spirometry:  Tracings reviewed. His effort: Good reproducible efforts. FVC: 3.24L  FEV1: 2.53L, 79% predicted FEV1/FVC ratio: 0.78 Interpretation: Spirometry consistent with normal pattern   Skin Testing: Environmental allergy panel and select foods.  Adequate controls. Results discussed with patient/family.  Airborne Adult Perc - 01/25/22 1011     Time Antigen Placed 1011    Allergen Manufacturer Lavella Hammock    Location Back    Number of Test 59    1. Control-Buffer 50% Glycerol Negative    2. Control-Histamine 1 mg/ml 3+    3. Albumin saline Negative    4. [place] 3+    5. [place] Negative    6. [place] Negative    7. [place] 4+    8. Meadow Fescue 4+    9. Perennial Rye 4+    10. Sweet Vernal 4+    11. Timothy 4+    12. Cocklebur Negative    13. Burweed Marshelder 3+    14. Ragweed, short Negative    15. Ragweed, Giant 2+    16. Plantain,  English 2+    17. Lamb's Quarters Negative    18. Sheep Sorrell Negative    19. Rough Pigweed Negative    20. Marsh Elder, Rough 2+    21. Mugwort, Common Negative    22. Ash mix Negative    23. Birch mix Negative    24. Beech American Negative    25. Box, Elder 2+    26. Cedar, red 3+    27. Cottonwood, [place] Negative    28. Elm mix Negative    29. Hickory 3+    30. Maple mix Negative    31. Oak, [place] mix 2+    32. Pecan Pollen 3+    33. Pine mix Negative    34. Sycamore Eastern Negative    35. Pacific, Black Pollen Negative    36. Alternaria alternata Negative    37. Cladosporium Herbarum Negative    38. Aspergillus mix Negative    39. Penicillium mix Negative    40. Bipolaris sorokiniana (Helminthosporium) Negative    41. Drechslera spicifera (Curvularia) Negative    42. Mucor plumbeus Negative    43. Fusarium  moniliforme Negative    44. Aureobasidium pullulans (pullulara) Negative    45. Rhizopus oryzae Negative    46. Botrytis cinera Negative    47.  Epicoccum nigrum Negative    48. Phoma betae Negative    49. Candida Albicans Negative    50. Trichophyton mentagrophytes Negative    51. Mite, D Farinae  5,000 AU/ml Negative    52. Mite, D Pteronyssinus  5,000 AU/ml Negative    53. Cat Hair 10,000 BAU/ml Negative    54.  Dog Epithelia Negative    55. Mixed Feathers Negative    56. Horse Epithelia Negative    57. Cockroach, German Negative    58. Mouse Negative    59. Tobacco Leaf 3+             Food Perc - 01/25/22 1011       Test Information   Time Antigen Placed 1011    Allergen Manufacturer Lavella Hammock    Location Back    Number of allergen test 10      Food   1. Peanut Negative    2. Soybean food Negative    3. Wheat, whole Negative    4. Sesame Negative    5. Milk, cow Negative    6. Egg White, chicken Negative    7. Casein Negative    8. Shellfish mix Negative    9. Fish mix Negative    10. Cashew Negative             Intradermal - 01/25/22 1100     Time Antigen Placed 1100    Allergen Manufacturer Other    Location Arm    Number of Test 9    Control Negative    Mold 1 Negative    Mold 2 Negative    Mold 3 Negative    Mold 4 Negative    Cat Negative    Dog Negative    Cockroach Negative    Mite mix Negative             Allergy testing results were read and interpreted by myself, documented by clinical staff.  Assessment and Plan  Chronic Rhinitis Seasonal Allergic: - allergy testing today: Positive to grass pollen, weed pollen, tree pollen and tobacco leaf  - Prevention:  - allergen avoidance when possible -Not a candidate for allergy injections given reduced ejection fraction/poor heart function.  - Symptom control: - Start fluticasone-azelastine 1-2 sprays in each nostril twice a day every day for nasal congestion/itchy nose - Start Singulair (Montelukast) '10mg'$  nightly.   - Discontinue if nightmares of behavior changes. - Start Antihistamine: daily or daily as needed.   -Options  include Zyrtec (Cetirizine) '10mg'$ , Claritin (Loratadine) '10mg'$ , Allegra (Fexofenadine) '180mg'$ , or Xyzal (Levocetirinze) '5mg'$  - Can be purchased over-the-counter if not covered by insurance. - if not controlled at next visit, consider referral to ENT.  COPD: - your lung testing today looked great - Controller Inhaler: Continue Trelegy 1 puff once a day; This Should Be Used Everyday - Rinse mouth out after use - Rescue Inhaler: Albuterol (Proair/Ventolin) 2 puffs . Use  every 4-6 hours as needed for chest tightness, wheezing, or coughing.  Can also use 15 minutes prior to exercise if you have symptoms with activity. - continue to follow-up with your primary care doctor for management - when you are ready to quit smoking, talk to your primary care doctor about this.   Follow-up in 3 months, sooner if needed.   This note in  its entirety was forwarded to the Provider who requested this consultation.  Thank you for your kind referral. I appreciate the opportunity to take part in Jameson's care. Please do not hesitate to contact me with questions.  Sincerely,  Sigurd Sos, MD [facility]

## 2022-01-25 ENCOUNTER — Other Ambulatory Visit: Payer: Self-pay

## 2022-01-25 ENCOUNTER — Encounter: Payer: Self-pay | Admitting: Internal Medicine

## 2022-01-25 ENCOUNTER — Ambulatory Visit (INDEPENDENT_AMBULATORY_CARE_PROVIDER_SITE_OTHER): Payer: Medicare Other | Admitting: Internal Medicine

## 2022-01-25 VITALS — BP 104/60 | HR 68 | Temp 96.7°F | Resp 18 | Ht 71.5 in | Wt 180.0 lb

## 2022-01-25 DIAGNOSIS — J301 Allergic rhinitis due to pollen: Secondary | ICD-10-CM | POA: Insufficient documentation

## 2022-01-25 DIAGNOSIS — J310 Chronic rhinitis: Secondary | ICD-10-CM

## 2022-01-25 DIAGNOSIS — J3089 Other allergic rhinitis: Secondary | ICD-10-CM | POA: Diagnosis not present

## 2022-01-25 DIAGNOSIS — J449 Chronic obstructive pulmonary disease, unspecified: Secondary | ICD-10-CM | POA: Diagnosis not present

## 2022-01-25 MED ORDER — MONTELUKAST SODIUM 10 MG PO TABS
10.0000 mg | ORAL_TABLET | Freq: Every day | ORAL | 3 refills | Status: DC
  Filled 2022-01-25: qty 30, 30d supply, fill #0
  Filled 2022-03-28: qty 30, 30d supply, fill #1

## 2022-01-25 MED ORDER — AZELASTINE-FLUTICASONE 137-50 MCG/ACT NA SUSP
2.0000 | Freq: Two times a day (BID) | NASAL | 3 refills | Status: DC
  Filled 2022-01-25: qty 23, 30d supply, fill #0

## 2022-01-25 NOTE — Patient Instructions (Addendum)
Chronic Rhinitis Seasonal Allergic: - allergy testing today: Positive to grass pollen, weed pollen, tree pollen and tobacco leaf  - Prevention:  - allergen avoidance when possible -Not a candidate for allergy injections given reduced ejection fraction/poor heart function.  - Symptom control: - Start fluticasone-azelastine 1-2 sprays in each nostril twice a day every day for nasal congestion/itchy nose - Start Singulair (Montelukast) '10mg'$  nightly.   - Discontinue if nightmares of behavior changes. - Start Antihistamine: daily or daily as needed.   -Options include Zyrtec (Cetirizine) '10mg'$ , Claritin (Loratadine) '10mg'$ , Allegra (Fexofenadine) '180mg'$ , or Xyzal (Levocetirinze) '5mg'$  - Can be purchased over-the-counter if not covered by insurance. - if not controlled at next visit, consider referral to ENT.  COPD: - your lung testing today looked great - Controller Inhaler: Continue Trelegy 1 puff once a day; This Should Be Used Everyday - Rinse mouth out after use - Rescue Inhaler: Albuterol (Proair/Ventolin) 2 puffs . Use  every 4-6 hours as needed for chest tightness, wheezing, or coughing.  Can also use 15 minutes prior to exercise if you have symptoms with activity. - continue to follow-up with your primary care doctor for management - when you are ready to quit smoking, talk to your primary care doctor about this.   Follow-up in 3 months, sooner if needed.  It was wonderful meeting you today! Thank you for letting me participate in your care. Sigurd Sos, MD [facility]  Reducing Pollen Exposure  The American Academy of Allergy, Asthma and Immunology suggests the following steps to reduce your exposure to pollen during allergy seasons.    Do not hang sheets or clothing out to dry; pollen may collect on these items. Do not mow lawns or spend time around freshly cut grass; mowing stirs up pollen. Keep windows closed at night.  Keep car windows closed while  driving. Minimize morning activities outdoors, a time when pollen counts are usually at their highest. Stay indoors as much as possible when pollen counts or humidity is high and on windy days when pollen tends to remain in the air longer. Use air conditioning when possible.  Many air conditioners have filters that trap the pollen spores. Use a HEPA room air filter to remove pollen form the indoor air you breathe.

## 2022-01-26 ENCOUNTER — Other Ambulatory Visit: Payer: Self-pay

## 2022-01-30 ENCOUNTER — Other Ambulatory Visit: Payer: Self-pay

## 2022-02-04 DIAGNOSIS — Z03818 Encounter for observation for suspected exposure to other biological agents ruled out: Secondary | ICD-10-CM | POA: Diagnosis not present

## 2022-02-06 ENCOUNTER — Telehealth: Payer: Self-pay | Admitting: Family Medicine

## 2022-02-06 NOTE — Telephone Encounter (Signed)
[name] from [facility] called in about fax sent on 11/07 about recommendation for astatin.

## 2022-02-06 NOTE — Telephone Encounter (Signed)
Form has been faxed back.

## 2022-02-24 DIAGNOSIS — Z1152 Encounter for screening for COVID-19: Secondary | ICD-10-CM | POA: Diagnosis not present

## 2022-03-01 DIAGNOSIS — Z03818 Encounter for observation for suspected exposure to other biological agents ruled out: Secondary | ICD-10-CM | POA: Diagnosis not present

## 2022-03-28 ENCOUNTER — Other Ambulatory Visit: Payer: Self-pay

## 2022-03-28 ENCOUNTER — Other Ambulatory Visit: Payer: Self-pay | Admitting: Internal Medicine

## 2022-03-28 DIAGNOSIS — I2510 Atherosclerotic heart disease of native coronary artery without angina pectoris: Secondary | ICD-10-CM

## 2022-03-28 DIAGNOSIS — E785 Hyperlipidemia, unspecified: Secondary | ICD-10-CM

## 2022-03-31 ENCOUNTER — Other Ambulatory Visit: Payer: Self-pay

## 2022-04-03 ENCOUNTER — Other Ambulatory Visit (HOSPITAL_COMMUNITY): Payer: Self-pay

## 2022-04-03 ENCOUNTER — Other Ambulatory Visit: Payer: Self-pay

## 2022-04-13 ENCOUNTER — Other Ambulatory Visit (HOSPITAL_COMMUNITY): Payer: Self-pay

## 2022-04-26 ENCOUNTER — Ambulatory Visit: Payer: Medicare Other | Admitting: Podiatry

## 2022-05-02 NOTE — Progress Notes (Deleted)
FOLLOW UP Date of Service/Encounter:  05/02/22   Subjective:  [name] (DOB: 22-Feb-1961) is a 62 y.o. male PMHx of COPD, chronic sinusitis, recurrent DVT and PE (on Eliquis), coronary artery disease (s/p stent, decreased EF 40-45%),tobacco abuse, status post posterior lumbar interbody fusion in 10/2020 who returns to the [facility] on 05/03/2022 in re-evaluation of the following: allergic rhinitis History obtained from: chart review and {Persons; PED relatives w/patient:[id number]::"patient"}.  For Review, LV was on 01/25/22  with Dr.Emigdio Wildeman seen for intial visit for allergic rhinitis . See below for details-we started ryaltris, singulair and an antihistamine. Discussed referral to ENT at follow-up if not controlled at next visit. Not a candidate for AIT.   Pertinent History/Diagnostics:  COPD : Not currently managed by Pulmonary.  Takes Trelegy 100, 1 puff daily-only using as needed.  Albuterol using only as needed.  He states very infrequently.  Has not needed in months. 2018 full pft: Conclusions: The diffusion defect and reduced lung volumes suggest an early parenchymal process. Mild airway obstruction is present. He is an active smoker, not ready to quit. -  spirometry (01/25/22): ratio 0.78, 79% FEV1  Allergic Rhinitis:  Started in adolescence. Perennial congestion and sneezing. + heartburn, using pantoprazole 40 mg daily, breakthrough sx 1-2 times per month. No prior ENT surgeries.  - SPT environmental panel (01/25/22): Positive to grass pollen, weed pollen, tree pollen and tobacco leaf   Today presents for follow-up. ***  Allergies as of 05/03/2022       Reactions   Atorvastatin    myalgia        Medication List        Accurate as of May 02, 2022  1:57 PM. If you have any questions, ask your nurse or doctor.          albuterol (2.5 MG/3ML) 0.083% nebulizer solution Commonly known as: PROVENTIL Take 3 mLs (2.5 mg total) by nebulization  every 6 (six) hours as needed for wheezing or shortness of breath.   albuterol 108 (90 Base) MCG/ACT inhaler Commonly known as: ProAir HFA Inhale 2 puffs into the lungs every 6 (six) hours as needed for wheezing or shortness of breath.   Azelastine-Fluticasone 137-50 MCG/ACT Susp Place 2 sprays into the nose every 12 (twelve) hours.   cetirizine 10 MG tablet Commonly known as: ZYRTEC Take 1 tablet (10 mg total) by mouth daily.   Eliquis 5 MG Tabs tablet Generic drug: apixaban TAKE 1 TABLET (5 MG TOTAL) BY MOUTH 2 (TWO) TIMES DAILY.   ezetimibe 10 MG tablet Commonly known as: ZETIA TAKE 1 TABLET (10 MG TOTAL) BY MOUTH DAILY.   furosemide 40 MG tablet Commonly known as: LASIX Take 1 tablet (40 mg total) by mouth daily as needed (May take '40mg'$  Daily for Swelling and Shortness of breath).   gabapentin 300 MG capsule Commonly known as: NEURONTIN Take 1 capsule (300 mg total) by mouth 2 (two) times daily.   isosorbide mononitrate 30 MG 24 hr tablet Commonly known as: IMDUR Take 1 tablet (30 mg total) by mouth daily.   Jardiance 10 MG Tabs tablet Generic drug: empagliflozin Take 1 tablet (10 mg total) by mouth daily before breakfast.   losartan 25 MG tablet Commonly known as: COZAAR Take 1 tablet (25 mg total) by mouth daily.   montelukast 10 MG tablet Commonly known as: Singulair Take 1 tablet (10 mg total) by mouth at bedtime.   nitroGLYCERIN 0.4 MG SL tablet Commonly known as: NITROSTAT Place 1  tablet (0.4 mg total) under the tongue every 5 (five) minutes as needed for chest pain.   pantoprazole 40 MG tablet Commonly known as: PROTONIX TAKE 1 TABLET (40 MG TOTAL) BY MOUTH DAILY.   Repatha SureClick 778 MG/ML Soaj Generic drug: Evolocumab INJECT 140 MG INTO THE SKIN EVERY 14 (FOURTEEN) DAYS.   Trelegy Ellipta 100-62.5-25 MCG/ACT Aepb Generic drug: Fluticasone-Umeclidin-Vilant Inhale 1 puff into the lungs daily.       Past Medical History:  Diagnosis Date    Asthma    CAD (coronary artery disease)    a. 09/2013 NSTEMI/PCI: LM nl, LAD 40-50%, D1 100 (2.25 x 28 Vision BMS), LCX min irregs, RI 60-70, 30, RCA 40-50/50-57m/p, EF 45-50%.  b. cath 12/2014 -occulded BMS in diag, 50% pro to mid LAD, 60% ramus, 30% RCA    Chest pain 08/2015   Chronic diastolic CHF (congestive heart failure) ([place]    a. 09/2014 EF 45-50% by LV gram;  b. 01/2014 Echo: EF 55-60%, Gr 1 DD.   Cocaine abuse ([facility]    COPD (chronic obstructive pulmonary disease) ([place]    DVT (deep venous thrombosis) ([place]    "I had ~ 10 in each leg" (12/20/2016)   Essential hypertension    Hepatitis C    "clear free over a year now"   High cholesterol    Myocardial infarction ([facility] ~ 2014   Osteoarthritis    a. hands and toes.   Pneumonia    "several times" (12/20/2016)   [place]ulmonary embolism ([place]    a. 2012 - s/p IVC filter;  b. prev on eliquis - noncompliant.   Shortness of breath dyspnea    Sinus headache    Squamous cell skin cancer 07/13/2016   "foot"   Tobacco abuse    Past Surgical History:  Procedure Laterality Date   BACK SURGERY  2022   CARDIAC CATHETERIZATION N/A 01/04/2015   Procedure: Left Heart Cath and Coronary Angiography;  Surgeon: CBurnell Blanks MD;  [facility];  Service: Cardiovascular;  Laterality: N/A;   CORONARY ANGIOPLASTY WITH STENT PLACEMENT  10/21/2013   BMS to D1   CYST EXCISION N/A 05/10/2016   Procedure: EXCISION OF SEBACEOUS CYST UPPER BACK;  Surgeon: MDonnie Mesa MD;  [place]  Service: General;  Laterality: N/A;   FOOT SURGERY Right ~ 06/2016   "said there was skin cancer on it"   LEFT HEART CATHETERIZATION WITH CORONARY ANGIOGRAM N/A 10/21/2013   Procedure: LEFT HEART CATHETERIZATION WITH CORONARY ANGIOGRAM;  Surgeon: DLeonie Man MD;  [facility];  Service: Cardiovascular;  Laterality: N/A;   VENA CAVA FILTER PLACEMENT  ~ 2007-~ 2017   "1 in my neck; 2 in my wrist"   Otherwise, there have been no  changes to his past medical history, surgical history, family history, or social history.  ROS: All others negative except as noted per HPI.   Objective:  There were no vitals taken for this visit. There is no height or weight on file to calculate BMI. Physical Exam: General Appearance:  Alert, cooperative, no distress, appears stated age  Head:  Normocephalic, without obvious abnormality, atraumatic  Eyes:  Conjunctiva clear, EOM's intact  Nose: Nares normal, {Blank multiple:19196:a:"***","hypertrophic turbinates","normal mucosa","no visible anterior polyps","septum midline"}  Throat: Lips, tongue normal; teeth and gums normal, {Blank multiple:19196:a:"***","normal posterior oropharynx","tonsils 2+","tonsils 3+","no tonsillar exudate","+ cobblestoning"}  Neck: Supple, symmetrical  Lungs:   {Blank multiple:19196:a:"***","clear to auscultation bilaterally","end-expiratory wheezing","wheezing throughout"}, Respirations unlabored, {Blank multiple:19196:a:"***","no coughing","intermittent dry coughing"}  Heart:  {Blank multiple:19196:a:"***","regular rate and rhythm","no murmur"}, Appears well perfused  Extremities: No edema  Skin: Skin color, texture, turgor normal, no rashes or lesions on visualized portions of skin  Neurologic: No gross deficits   Reviewed: ***  Spirometry:  Tracings reviewed. His effort: {Blank single:19197::"Good reproducible efforts.","It was hard to get consistent efforts and there is a question as to whether this reflects a maximal maneuver.","Poor effort, data can not be interpreted.","Variable effort-results affected.","decent for first attempt at spirometry."} FVC: ***L FEV1: ***L, ***% predicted FEV1/FVC ratio: ***% Interpretation: {Blank single:19197::"Spirometry consistent with mild obstructive disease","Spirometry consistent with moderate obstructive disease","Spirometry consistent with severe obstructive disease","Spirometry consistent with possible restrictive  disease","Spirometry consistent with mixed obstructive and restrictive disease","Spirometry uninterpretable due to technique","Spirometry consistent with normal pattern","No overt abnormalities noted given today's efforts"}.  Please see scanned spirometry results for details.  Skin Testing: {Blank single:19197::"Select foods","Environmental allergy panel","Environmental allergy panel and select foods","Food allergy panel","None","Deferred due to recent antihistamines use","deferred due to recent reaction"}. ***Adequate positive and negative controls Results discussed with patient/family.   {Blank single:19197::"Allergy testing results were read and interpreted by myself, documented by clinical staff."," "}  Assessment/Plan   ***  Sigurd Sos, MD  [facility] [place]

## 2022-05-03 ENCOUNTER — Ambulatory Visit: Payer: Medicare Other | Admitting: Internal Medicine

## 2022-05-10 ENCOUNTER — Ambulatory Visit: Payer: Medicare Other | Admitting: Podiatry

## 2022-05-24 ENCOUNTER — Other Ambulatory Visit: Payer: Self-pay

## 2022-05-26 ENCOUNTER — Ambulatory Visit: Payer: Self-pay | Admitting: *Deleted

## 2022-05-26 NOTE — Telephone Encounter (Signed)
Reason for Disposition . [1] Sinus congestion (pressure, fullness) AND [2] present > 10 days  Answer Assessment - Initial Assessment Questions 1. LOCATION: "Where does it hurt?"      Nasal congestion- patient states he get nasal congestion on each side- blocked and hard to breath 2. ONSET: "When did the sinus pain start?"  (e.g., hours, days)      Sees allergy doctor for this- allergic to several things- medication does not help congestion- Singular 3. SEVERITY: "How bad is the pain?"   (Scale 1-10; mild, moderate or severe)   - MILD (1-3): doesn't interfere with normal activities    - MODERATE (4-7): interferes with normal activities (e.g., work or school) or awakens from sleep   - SEVERE (8-10): excruciating pain and patient unable to do any normal activities        No pain 4. RECURRENT SYMPTOM: "Have you ever had sinus problems before?" If Yes, ask: "When was the last time?" and "What happened that time?"      chronic 5. NASAL CONGESTION: "Is the nose blocked?" If Yes, ask: "Can you open it or must you breathe through your mouth?"     No congestion- patient states it may be physical issue 6. NASAL DISCHARGE: "Do you have discharge from your nose?" If so ask, "What color?"     No congestion- unable to clear congestion 7. FEVER: "Do you have a fever?" If Yes, ask: "What is it, how was it measured, and when did it start?"      no 8. OTHER SYMPTOMS: "Do you have any other symptoms?" (e.g., sore throat, cough, earache, difficulty breathing)     no  Protocols used: Sinus Pain or Congestion-A-AH

## 2022-05-26 NOTE — Telephone Encounter (Signed)
Noted  

## 2022-05-26 NOTE — Telephone Encounter (Signed)
  Chief Complaint: patient is calling to get number for allergist- nasal congestion  Symptoms: chronic nasal congestion- blocked nasal passage- either side- makes it hard for patient to breath- over due for follow up with allergy provider Frequency: chronic Pertinent Negatives: Patient denies pain, other symptoms Disposition: '[]'$ ED /'[]'$ Urgent Care (no appt availability in office) / '[]'$ Appointment(In office/virtual)/ '[]'$  [facility]/ '[]'$ Home Care/ '[]'$ Refused Recommended Disposition /'[]'$ [facility]/ '[x]'$  Follow-up with PCP Additional Notes: Patient is calling to get number of allergist- he does not have it and is overdue for follow up. Patient did mention other chronic issues- back pain(previous surgery) and hx of blood clots- no symptoms at this time. Patient does have f/u appointment with PCP in 13 days and he was reminded of that appointment- he is going to call specialist for his allergy concerns- name and number given.

## 2022-06-05 NOTE — Progress Notes (Unsigned)
FOLLOW UP Date of Service/Encounter:  06/07/22   Subjective:  [name] (DOB: Oct 04, 1960) is a 62 y.o. male PMHx of COPD, chronic sinusitis, recurrent DVT and PE (on Eliquis), coronary artery disease (s/p stent, decreased EF 40-45%),tobacco abuse, status post posterior lumbar interbody fusion in 10/2020 who returns to the [facility] on 06/07/2022 in re-evaluation of the following: allergic rhinitis History obtained from: chart review and patient.  For Review, LV was on 01/25/22  with Dr.Manley Fason seen for intial visit for chronic rhinitis . See details below.  We started singulair, AH, dymista.  Pertinent History/Diagnostics:  COPD : On Trelegy, infrequent albuterol use. Not followed by Pulm. 2018 full pft: Conclusions: The diffusion defect and reduced lung volumes suggest an early parenchymal process. Mild airway obstruction is present. He is an active smoker, not ready to quit -  spirometry (01/25/22): ratio 0.78, 79% FEV1 -nonobstructive ratio Allergic Rhinitis:  Started when he was around 62 yo Perennial nasal congestion and sneezing  Potential triggers: changes from warm to cold weather Treatments tried: azelastine-fluticasone, zyrtec - not using regularly, doesn't find them effective Occasional reflux. - SPT environmental panel (01/25/22): Positive to grass pollen, weed pollen, tree pollen and tobacco leaf  - not a candidate for allergy injection due to reduced EF/poor heart function ------------------------------------------------------------ Today presents for follow-up. He is having a hard time sleeping at night, due to nasal congestion which alternates sides.   He is only taking singulair nightly, not on any antihistamines. He was not aware he was to take these. He is taking dymista 2 sprays daily, but sometimes doesn't use it because he is so congested the medications won't go in.  He has never tried saline rinses.  He has not seen an ENT doctor.     He does have a history of COPD, current tobacco use of around 0.25 ppd x 40 years. Reports infrequent use of albuterol. Managed by primary.   Allergies as of 06/07/2022       Reactions   Atorvastatin    myalgia        Medication List        Accurate as of June 07, 2022 10:40 AM. If you have any questions, ask your nurse or doctor.          STOP taking these medications    gabapentin 300 MG capsule Commonly known as: NEURONTIN Stopped by: Clemon Chambers, MD       TAKE these medications    albuterol (2.5 MG/3ML) 0.083% nebulizer solution Commonly known as: PROVENTIL Take 3 mLs (2.5 mg total) by nebulization every 6 (six) hours as needed for wheezing or shortness of breath.   albuterol 108 (90 Base) MCG/ACT inhaler Commonly known as: ProAir HFA Inhale 2 puffs into the lungs every 6 (six) hours as needed for wheezing or shortness of breath.   Azelastine-Fluticasone 137-50 MCG/ACT Susp Place 2 sprays into the nose every 12 (twelve) hours.   cetirizine 10 MG tablet Commonly known as: ZYRTEC Take 1 tablet (10 mg total) by mouth daily.   Eliquis 5 MG Tabs tablet Generic drug: apixaban TAKE 1 TABLET (5 MG TOTAL) BY MOUTH 2 (TWO) TIMES DAILY.   ezetimibe 10 MG tablet Commonly known as: ZETIA TAKE 1 TABLET (10 MG TOTAL) BY MOUTH DAILY.   furosemide 40 MG tablet Commonly known as: LASIX Take 1 tablet (40 mg total) by mouth daily as needed (May take '40mg'$  Daily for Swelling and Shortness of breath).   isosorbide mononitrate  30 MG 24 hr tablet Commonly known as: IMDUR Take 1 tablet (30 mg total) by mouth daily.   Jardiance 10 MG Tabs tablet Generic drug: empagliflozin Take 1 tablet (10 mg total) by mouth daily before breakfast.   losartan 25 MG tablet Commonly known as: COZAAR Take 1 tablet (25 mg total) by mouth daily.   montelukast 10 MG tablet Commonly known as: Singulair Take 1 tablet (10 mg total) by mouth at bedtime.   nitroGLYCERIN 0.4 MG SL  tablet Commonly known as: NITROSTAT Place 1 tablet (0.4 mg total) under the tongue every 5 (five) minutes as needed for chest pain.   pantoprazole 40 MG tablet Commonly known as: PROTONIX TAKE 1 TABLET (40 MG TOTAL) BY MOUTH DAILY.   Repatha SureClick [id number] MG/ML Soaj Generic drug: Evolocumab INJECT 140 MG INTO THE SKIN EVERY 14 (FOURTEEN) DAYS.   Trelegy Ellipta 100-62.5-25 MCG/ACT Aepb Generic drug: Fluticasone-Umeclidin-Vilant Inhale 1 puff into the lungs daily.       Past Medical History:  Diagnosis Date   Asthma    CAD (coronary artery disease)    a. 09/2013 NSTEMI/PCI: LM nl, LAD 40-50%, D1 100 (2.25 x 28 Vision BMS), LCX min irregs, RI 60-70, 30, RCA 40-50/50-22m/p, EF 45-50%.  b. cath 12/2014 -occulded BMS in diag, 50% pro to mid LAD, 60% ramus, 30% RCA    Chest pain 08/2015   Chronic diastolic CHF (congestive heart failure) ([facility]    a. 09/2014 EF 45-50% by LV gram;  b. 01/2014 Echo: EF 55-60%, Gr 1 DD.   Cocaine abuse ([facility]    COPD (chronic obstructive pulmonary disease) ([facility]    DVT (deep venous thrombosis) ([facility]    "I had ~ 10 in each leg" (12/20/2016)   Essential hypertension    Hepatitis C    "clear free over a year now"   High cholesterol    Myocardial infarction ([facility] ~ 2014   Osteoarthritis    a. hands and toes.   Pneumonia    "several times" (12/20/2016)   Pulmonary embolism ([facility]    a. 2012 - s/p IVC filter;  b. prev on eliquis - noncompliant.   Shortness of breath dyspnea    Sinus headache    Squamous cell skin cancer 07/13/2016   "foot"   Tobacco abuse    Past Surgical History:  Procedure Laterality Date   BACK SURGERY  2022   CARDIAC CATHETERIZATION N/A 01/04/2015   Procedure: Left Heart Cath and Coronary Angiography;  Surgeon: CBurnell Blanks MD;  [facility];  Service: Cardiovascular;  Laterality: N/A;   CORONARY ANGIOPLASTY WITH STENT PLACEMENT  10/21/2013   BMS to D1   CYST EXCISION N/A 05/10/2016   Procedure: EXCISION  OF SEBACEOUS CYST UPPER BACK;  Surgeon: MDonnie Mesa MD;  [place]  Service: General;  Laterality: N/A;   FOOT SURGERY Right ~ 06/2016   "said there was skin cancer on it"   LEFT HEART CATHETERIZATION WITH CORONARY ANGIOGRAM N/A 10/21/2013   Procedure: LEFT HEART CATHETERIZATION WITH CORONARY ANGIOGRAM;  Surgeon: DLeonie Man MD;  [facility];  Service: Cardiovascular;  Laterality: N/A;   VENA CAVA FILTER PLACEMENT  ~ 2007-~ 2017   "1 in my neck; 2 in my wrist"   Otherwise, there have been no changes to his past medical history, surgical history, family history, or social history.  ROS: All others negative except as noted per HPI.   Objective:  BP 134/86 (BP Location: Left Arm, Patient  Position: Sitting, Cuff Size: Normal)   Pulse 92   Temp 97.6 F (36.4 C) (Temporal)   Resp 18   SpO2 99%  There is no height or weight on file to calculate BMI. Physical Exam: General Appearance:  Alert, cooperative, no distress, appears stated age  Head:  Normocephalic, without obvious abnormality, atraumatic  Eyes:  Conjunctiva clear, EOM's intact  Nose: Nares normal, hypertrophic turbinates and normal mucosa  Throat: Lips, tongue normal; teeth and gums normal, normal posterior oropharynx  Neck: Supple, symmetrical  Lungs:   clear to auscultation bilaterally, Respirations unlabored, no coughing  Heart:  regular rate and rhythm and no murmur, Appears well perfused  Extremities: No edema  Skin: Skin color, texture, turgor normal, no rashes or lesions on visualized portions of skin  Neurologic: No gross deficits    Assessment/Plan   Seasonal Allergic Rhinitis: not controlled - 2023 allergy testing: Positive to grass pollen, weed pollen, tree pollen and tobacco leaf - Prevention:  - allergen avoidance when possible -Not a candidate for allergy injections given reduced ejection fraction/poor heart function. - Symptom control: - start doing sinus rinses twice daily with  distilled water only or boiled tap water that has cooled. - Continue fluticasone-azelastine 1-2 sprays in each nostril twice a day every day for nasal congestion/itchy nose - Continue Singulair (Montelukast) '10mg'$  nightly.   - Discontinue if nightmares of behavior changes. - Start Antihistamine: daily or daily as needed.   -Options include Zyrtec (Cetirizine) '10mg'$  daily or twice daily. - referral to ENT to explore other treatment options  COPD: managed by primary - Controller Inhaler: Continue Trelegy as prescribed by your primary doctor - Rinse mouth out after use - Rescue Inhaler: Albuterol (Proair/Ventolin) 2 puffs . Use  every 4-6 hours as needed for chest tightness, wheezing, or coughing.  Can also use 15 minutes prior to exercise if you have symptoms with activity. - continue to follow-up with your primary care doctor for management - when you are ready to quit smoking, talk to your primary care doctor about this.   Follow-up in 6 months, sooner if needed.   Sigurd Sos, MD  [facility]

## 2022-06-06 ENCOUNTER — Encounter: Payer: Self-pay | Admitting: Pharmacist

## 2022-06-06 DIAGNOSIS — G720 Drug-induced myopathy: Secondary | ICD-10-CM

## 2022-06-06 NOTE — Progress Notes (Signed)
[facility])                                            [place] Team                                        Statin Quality Measure Assessment    06/06/2022  SUSANA AGUILLAR Nov 09, 1960 [id number]    Per review of chart and payor information, patient has a diagnosis of cardiovascular disease but is not currently filling a statin prescription.  This places patient into the [facility] (Statin Use In Patients with Cardiovascular Disease) measure for CMS.     Patient has documented trials of statins with reported myopathy, but no corresponding CPT codes that would exclude patient from [facility] measure. He is on Repatha  Patient has an upcoming appointment 06/07/22. If deemed therapeutically appropriate, a statin exclusion code could be associated with the upcoming visit.     Component Value Date/Time   CHOL 218 (H) 12/07/2021 0908   TRIG 99 12/07/2021 0908   HDL 61 12/07/2021 0908   CHOLHDL 2.4 08/13/2020 1105   CHOLHDL 3.1 01/01/2015 0208   VLDL 12 01/01/2015 0208   LDLCALC 139 (H) 12/07/2021 0908     Please consider ONE of the following recommendations:  Initiate high intensity statin Atorvastatin '40mg'$  once daily, #90, 3 refills   Rosuvastatin '20mg'$  once daily, #90, 3 refills    Initiate moderate intensity  statin with reduced frequency if prior  statin intolerance 1x weekly, #13, 3 refills   2x weekly, #26, 3 refills   3x weekly, #39, 3 refills    Code for past statin intolerance  (required annually)   Provider Requirements:  Must asociate code during an office visit or telehealth encounter   Drug Induced Myopathy G72.0   Myalgia M79.1   Myositis, unspecified M60.9   Myopathy, unspecified G72.9   Rhabdomyolysis M62.82     Plan: Send note to Provider prior to the upcoming appointment.  Elayne Guerin, PharmD, [place] Clinical Pharmacist [phone]

## 2022-06-07 ENCOUNTER — Ambulatory Visit: Payer: 59 | Attending: Family Medicine | Admitting: Family Medicine

## 2022-06-07 ENCOUNTER — Encounter: Payer: Self-pay | Admitting: Family Medicine

## 2022-06-07 ENCOUNTER — Ambulatory Visit (INDEPENDENT_AMBULATORY_CARE_PROVIDER_SITE_OTHER): Payer: 59 | Admitting: Internal Medicine

## 2022-06-07 ENCOUNTER — Other Ambulatory Visit: Payer: Self-pay

## 2022-06-07 ENCOUNTER — Encounter: Payer: Self-pay | Admitting: Internal Medicine

## 2022-06-07 VITALS — BP 111/72 | HR 81 | Ht 74.0 in | Wt 176.2 lb

## 2022-06-07 VITALS — BP 134/86 | HR 92 | Temp 97.6°F | Resp 18

## 2022-06-07 DIAGNOSIS — I110 Hypertensive heart disease with heart failure: Secondary | ICD-10-CM | POA: Diagnosis not present

## 2022-06-07 DIAGNOSIS — E440 Moderate protein-calorie malnutrition: Secondary | ICD-10-CM | POA: Diagnosis not present

## 2022-06-07 DIAGNOSIS — J441 Chronic obstructive pulmonary disease with (acute) exacerbation: Secondary | ICD-10-CM

## 2022-06-07 DIAGNOSIS — I5042 Chronic combined systolic (congestive) and diastolic (congestive) heart failure: Secondary | ICD-10-CM

## 2022-06-07 DIAGNOSIS — T466X5A Adverse effect of antihyperlipidemic and antiarteriosclerotic drugs, initial encounter: Secondary | ICD-10-CM | POA: Diagnosis not present

## 2022-06-07 DIAGNOSIS — G720 Drug-induced myopathy: Secondary | ICD-10-CM

## 2022-06-07 DIAGNOSIS — R0600 Dyspnea, unspecified: Secondary | ICD-10-CM

## 2022-06-07 DIAGNOSIS — F17210 Nicotine dependence, cigarettes, uncomplicated: Secondary | ICD-10-CM | POA: Diagnosis not present

## 2022-06-07 DIAGNOSIS — I25119 Atherosclerotic heart disease of native coronary artery with unspecified angina pectoris: Secondary | ICD-10-CM

## 2022-06-07 DIAGNOSIS — J449 Chronic obstructive pulmonary disease, unspecified: Secondary | ICD-10-CM | POA: Diagnosis not present

## 2022-06-07 DIAGNOSIS — J3089 Other allergic rhinitis: Secondary | ICD-10-CM

## 2022-06-07 MED ORDER — ALBUTEROL SULFATE HFA 108 (90 BASE) MCG/ACT IN AERS
2.0000 | INHALATION_SPRAY | Freq: Four times a day (QID) | RESPIRATORY_TRACT | 6 refills | Status: DC | PRN
  Filled 2022-06-07: qty 6.7, 25d supply, fill #0
  Filled 2022-08-02 (×2): qty 6.7, 25d supply, fill #1

## 2022-06-07 MED ORDER — TRELEGY ELLIPTA 100-62.5-25 MCG/ACT IN AEPB
1.0000 | INHALATION_SPRAY | Freq: Every day | RESPIRATORY_TRACT | 6 refills | Status: DC
  Filled 2022-06-07: qty 60, 30d supply, fill #0
  Filled 2022-08-02 (×2): qty 60, 30d supply, fill #1
  Filled 2022-09-22: qty 60, 30d supply, fill #2
  Filled 2022-10-30: qty 60, 30d supply, fill #0
  Filled 2022-10-30: qty 60, 30d supply, fill #3
  Filled 2023-01-02 (×2): qty 60, 30d supply, fill #1
  Filled 2023-03-08: qty 60, 30d supply, fill #2

## 2022-06-07 MED ORDER — PANTOPRAZOLE SODIUM 40 MG PO TBEC
40.0000 mg | DELAYED_RELEASE_TABLET | Freq: Every day | ORAL | 6 refills | Status: DC
  Filled 2022-06-07: qty 30, 30d supply, fill #0
  Filled 2022-07-24: qty 90, 90d supply, fill #1
  Filled 2022-12-01 (×2): qty 90, 90d supply, fill #0
  Filled 2022-12-01: qty 90, 90d supply, fill #2
  Filled 2022-12-05: qty 90, 90d supply, fill #0

## 2022-06-07 MED ORDER — CETIRIZINE HCL 10 MG PO TABS
10.0000 mg | ORAL_TABLET | Freq: Every day | ORAL | 1 refills | Status: AC
  Filled 2022-06-07 – 2022-12-05 (×5): qty 90, 90d supply, fill #0

## 2022-06-07 MED ORDER — AZELASTINE-FLUTICASONE 137-50 MCG/ACT NA SUSP
2.0000 | Freq: Two times a day (BID) | NASAL | 5 refills | Status: DC
  Filled 2022-06-07: qty 23, 30d supply, fill #0
  Filled 2022-08-02 (×2): qty 23, 30d supply, fill #1
  Filled 2022-10-05 – 2022-10-30 (×2): qty 23, 30d supply, fill #2
  Filled 2022-10-30: qty 23, 30d supply, fill #0

## 2022-06-07 MED ORDER — MONTELUKAST SODIUM 10 MG PO TABS
10.0000 mg | ORAL_TABLET | Freq: Every day | ORAL | 1 refills | Status: DC
  Filled 2022-06-07 – 2022-12-01 (×3): qty 90, 90d supply, fill #0
  Filled 2022-12-01: qty 90, 90d supply, fill #1
  Filled 2022-12-05: qty 90, 90d supply, fill #0

## 2022-06-07 NOTE — Patient Instructions (Addendum)
Seasonal Allergic Rhinitis: not controlled - 2023 allergy testing: Positive to grass pollen, weed pollen, tree pollen and tobacco leaf  - Prevention:  - allergen avoidance when possible -Not a candidate for allergy injections given reduced ejection fraction/poor heart function.  - Symptom control: - start doing sinus rinses twice daily with distilled water only or boiled tap water that has cooled. - Continue fluticasone-azelastine 1-2 sprays in each nostril twice a day every day for nasal congestion/itchy nose - Continue Singulair (Montelukast) '10mg'$  nightly.   - Discontinue if nightmares of behavior changes. - Start Antihistamine:  Zyrtec (Cetirizine) '10mg'$  daily or twice daily. - referral to ENT to explore other treatment options  COPD: managed by primary - Controller Inhaler: Continue Trelegy as prescribed by your primary doctor - Rinse mouth out after use - Rescue Inhaler: Albuterol (Proair/Ventolin) 2 puffs . Use  every 4-6 hours as needed for chest tightness, wheezing, or coughing.  Can also use 15 minutes prior to exercise if you have symptoms with activity. - continue to follow-up with your primary care doctor for management - when you are ready to quit smoking, talk to your primary care doctor about this.   Follow-up in 6 months, sooner if needed.  It was wonderful seeing you again today! Thank you for letting me participate in your care. Sigurd Sos, MD [facility]  Reducing Pollen Exposure  The American Academy of Allergy, Asthma and Immunology suggests the following steps to reduce your exposure to pollen during allergy seasons.    Do not hang sheets or clothing out to dry; pollen may collect on these items. Do not mow lawns or spend time around freshly cut grass; mowing stirs up pollen. Keep windows closed at night.  Keep car windows closed while driving. Minimize morning activities outdoors, a time when pollen counts are usually at their  highest. Stay indoors as much as possible when pollen counts or humidity is high and on windy days when pollen tends to remain in the air longer. Use air conditioning when possible.  Many air conditioners have filters that trap the pollen spores. Use a HEPA room air filter to remove pollen form the indoor air you breathe.

## 2022-06-07 NOTE — Patient Instructions (Signed)
Heart Failure Action Plan A heart failure action plan helps you understand what to do when you have symptoms of heart failure. Your action plan is a color-coded plan that lists the symptoms to watch for and indicates what actions to take. If you have symptoms in the red zone, you need medical care right away. If you have symptoms in the yellow zone, you are having problems. If you have symptoms in the green zone, you are doing well. Follow the plan that was created by you and your health care provider. Review your plan each time you visit your health care provider. Red zone These signs and symptoms mean you should get medical help right away: You have trouble breathing when resting. You have a dry cough that is getting worse. You have swelling or pain in your legs or abdomen that is getting worse. You suddenly gain more than 2-3 lb (0.9-1.4 kg) in 24 hours, or more than 5 lb (2.3 kg) in a week. This amount may be more or less depending on your condition. You have trouble staying awake or you feel confused. You have chest pain. You do not have an appetite. You pass out. You have worsening sadness or depression. If you have any of these symptoms, call your local emergency services (911 in the [place] right away. Do not drive yourself to the hospital. Yellow zone These signs and symptoms mean your condition may be getting worse and you should make some changes: You have trouble breathing when you are active, or you need to sleep with your head raised on extra pillows to help you breathe. You have swelling in your legs or abdomen. You gain 2-3 lb (0.9-1.4 kg) in 24 hours, or 5 lb (2.3 kg) in a week. This amount may be more or less depending on your condition. You get tired easily. You have trouble sleeping. You have a dry cough. If you have any of these symptoms: Contact your health care provider within the next day. Your health care provider may adjust your medicines. Green zone These signs  mean you are doing well and can continue what you are doing: You do not have shortness of breath. You have very little swelling or no new swelling. Your weight is stable (no gain or loss). You have a normal activity level. You do not have chest pain or any other new symptoms. Follow these instructions at home: Take over-the-counter and prescription medicines only as told by your health care provider. Weigh yourself daily. Your target weight is __________ lb (__________ kg). Call your health care provider if you gain more than __________ lb (__________ kg) in 24 hours, or more than __________ lb (__________ kg) in a week. Health care provider name: _____________________________________________________ Health care provider phone number: _____________________________________________________ Eat a heart-healthy diet. Work with a diet and nutrition specialist (dietitian) to create an eating plan that is best for you. Keep all follow-up visits. This is important. Where to find more information American Heart Association: Summary A heart failure action plan helps you understand what to do when you have symptoms of heart failure. Follow the action plan that was created by you and your health care provider. Get help right away if you have any symptoms in the red zone. This information is not intended to replace advice given to you by your health care provider. Make sure you discuss any questions you have with your health care provider. Document Revised: 06/21/2021 Document Reviewed: 10/27/2019 Elsevier Patient Education  2023 [employer]  

## 2022-06-07 NOTE — Progress Notes (Unsigned)
Subjective:  Patient ID: [name], male    DOB: 10/04/1960  Age: 62 y.o. MRN: [id number]  CC: Hypertension   HPI [name] is a 62 y.o. year old male with a history of COPD, chronic sinusitis, recurrent DVT and PE (on anticoagulation with Eliquis), coronary artery disease (status post stent, last cardiac cath from 12/2014 revealed two-vessel CAD, medical management recommended),  (EF 40-45% from 01/2021),tobacco abuse, status post posterior lumbar interbody fusion in 10/2020.   Interval History:  He has been sneezing and has had nasal congestion for a while. Seen by his Allergist earlier today. He states he has to sit a few times after he gets his groceries prior to getting home. He has no pedal edema but does have some orthopnea. He has not seen his Cardiologist since 08/2021 and has been out of his Evolocumab  Still smokes 6 Cigarettes/ day Past Medical History:  Diagnosis Date   Asthma    CAD (coronary artery disease)    a. 09/2013 NSTEMI/PCI: LM nl, LAD 40-50%, D1 100 (2.25 x 28 Vision BMS), LCX min irregs, RI 60-70, 30, RCA 40-50/50-39m/p, EF 45-50%.  b. cath 12/2014 -occulded BMS in diag, 50% pro to mid LAD, 60% ramus, 30% RCA    Chest pain 08/2015   Chronic diastolic CHF (congestive heart failure) ([facility]    a. 09/2014 EF 45-50% by LV gram;  b. 01/2014 Echo: EF 55-60%, Gr 1 DD.   Cocaine abuse ([facility]    COPD (chronic obstructive pulmonary disease) ([facility]    DVT (deep venous thrombosis) ([facility]    "I had ~ 10 in each leg" (12/20/2016)   Essential hypertension    Hepatitis C    "clear free over a year now"   High cholesterol    Myocardial infarction ([facility] ~ 2014   Osteoarthritis    a. hands and toes.   Pneumonia    "several times" (12/20/2016)   [place] ([facility]    a. 2012 - s/p IVC filter;  b. prev on eliquis - noncompliant.   Shortness of breath dyspnea    Sinus headache    Squamous cell skin cancer 07/13/2016   "foot"   Tobacco abuse     Past  Surgical History:  Procedure Laterality Date   BACK SURGERY  2022   CARDIAC CATHETERIZATION N/A 01/04/2015   Procedure: Left Heart Cath and Coronary Angiography;  Surgeon: CBurnell Blanks MD;  [facility];  Service: Cardiovascular;  Laterality: N/A;   CORONARY ANGIOPLASTY WITH STENT PLACEMENT  10/21/2013   BMS to D1   CYST EXCISION N/A 05/10/2016   Procedure: EXCISION OF SEBACEOUS CYST UPPER BACK;  Surgeon: MDonnie Mesa MD;  [facility]  Service: General;  Laterality: N/A;   FOOT SURGERY Right ~ 06/2016   "said there was skin cancer on it"   LEFT HEART CATHETERIZATION WITH CORONARY ANGIOGRAM N/A 10/21/2013   Procedure: LEFT HEART CATHETERIZATION WITH CORONARY ANGIOGRAM;  Surgeon: DLeonie Man MD;  [facility];  Service: Cardiovascular;  Laterality: N/A;   VENA CAVA FILTER PLACEMENT  ~ 2007-~ 2017   "1 in my neck; 2 in my wrist"    Family History  Problem Relation Age of Onset   Asthma Mother    Allergies Mother    Allergies Sister    Allergies Brother    Deep vein thrombosis Brother        two brothers with recurrent DVT   Heart attack Father  a.60s b. deceased in his 35s   Coronary artery disease Father    Colon cancer Neg Hx    Liver cancer Neg Hx    Esophageal cancer Neg Hx    Colon polyps Neg Hx    Rectal cancer Neg Hx    Pancreatic cancer Neg Hx    Stomach cancer Neg Hx     Social History   Socioeconomic History   Marital status: Single    Spouse name: Not on file   Number of children: Not on file   Years of education: Not on file   Highest education level: Not on file  Occupational History   Occupation: unemployed  Tobacco Use   Smoking status: Every Day    Packs/day: 0.25    Years: 39.00    Total pack years: 9.75    Types: Cigarettes, Cigars    Passive exposure: Never   Smokeless tobacco: Never  Vaping Use   Vaping Use: Never used  Substance and Sexual Activity   Alcohol use: Yes    Alcohol/week: 6.0  standard drinks of alcohol    Types: 2 Cans of beer, 4 Shots of liquor per week   Drug use: Not Currently    Types: Cocaine    Comment: 12/20/2016 "might have used some the other day; I'm not sure"   Sexual activity: Not Currently  Other Topics Concern   Not on file  Social History Narrative   Lives in [place].   Social Determinants of Health   Financial Resource Strain: Not on file  Food Insecurity: Not on file  Transportation Needs: Unmet Transportation Needs (04/07/2021)   PRAPARE - Hydrologist (Medical): Yes    Lack of Transportation (Non-Medical): Yes  Physical Activity: Not on file  Stress: Not on file  Social Connections: Not on file    Allergies  Allergen Reactions   Atorvastatin     myalgia    Outpatient Medications Prior to Visit  Medication Sig Dispense Refill   albuterol (PROAIR HFA) 108 (90 Base) MCG/ACT inhaler Inhale 2 puffs into the lungs every 6 (six) hours as needed for wheezing or shortness of breath. 8.5 g 6   albuterol (PROVENTIL) (2.5 MG/3ML) 0.083% nebulizer solution Take 3 mLs (2.5 mg total) by nebulization every 6 (six) hours as needed for wheezing or shortness of breath. 75 mL 3   apixaban (ELIQUIS) 5 MG TABS tablet TAKE 1 TABLET (5 MG TOTAL) BY MOUTH 2 (TWO) TIMES DAILY. 180 tablet 1   Azelastine-Fluticasone 137-50 MCG/ACT SUSP Place 2 sprays into the nose every 12 (twelve) hours. 23 g 5   cetirizine (ZYRTEC) 10 MG tablet Take 1 tablet (10 mg total) by mouth daily. 90 tablet 1   empagliflozin (JARDIANCE) 10 MG TABS tablet Take 1 tablet (10 mg total) by mouth daily before breakfast. 90 tablet 3   Evolocumab 140 MG/ML SOAJ INJECT 140 MG INTO THE SKIN EVERY 14 (FOURTEEN) DAYS. 6 mL 3   ezetimibe (ZETIA) 10 MG tablet TAKE 1 TABLET (10 MG TOTAL) BY MOUTH DAILY. 90 tablet 3   Fluticasone-Umeclidin-Vilant (TRELEGY ELLIPTA) 100-62.5-25 MCG/ACT AEPB Inhale 1 puff into the lungs daily. 60 each 4   furosemide (LASIX) 40 MG tablet Take 1  tablet (40 mg total) by mouth daily as needed (May take '40mg'$  Daily for Swelling and Shortness of breath). 90 tablet 1   isosorbide mononitrate (IMDUR) 30 MG 24 hr tablet Take 1 tablet (30 mg total) by mouth daily. 90 tablet 1  losartan (COZAAR) 25 MG tablet Take 1 tablet (25 mg total) by mouth daily. 90 tablet 3   montelukast (SINGULAIR) 10 MG tablet Take 1 tablet (10 mg total) by mouth at bedtime. 90 tablet 1   nitroGLYCERIN (NITROSTAT) 0.4 MG SL tablet Place 1 tablet (0.4 mg total) under the tongue every 5 (five) minutes as needed for chest pain. 25 tablet 3   pantoprazole (PROTONIX) 40 MG tablet TAKE 1 TABLET (40 MG TOTAL) BY MOUTH DAILY. 30 tablet 2   No facility-administered medications prior to visit.     ROS Review of Systems *** Objective:  BP 111/72   Pulse 81   Ht '6\' 2"'$  (1.88 m)   Wt 176 lb 3.2 oz (79.9 kg)   SpO2 98%   BMI 22.62 kg/m      06/07/2022    1:43 PM 06/07/2022   10:16 AM 01/25/2022    9:17 AM  BP/Weight  Systolic BP [id number]  Diastolic BP 72 86 60  Wt. (Lbs) 176.2  180  BMI 22.62 kg/m2  24.76 kg/m2      Physical Exam ***    Latest Ref Rng & Units 12/07/2021    9:08 AM 09/06/2021    9:35 AM 11/02/2020    8:38 AM  CMP  Glucose 70 - 99 mg/dL 87  85  114   BUN 8 - 27 mg/dL '9  7  10   '$ Creatinine 0.76 - 1.27 mg/dL 0.69  0.60  0.76   Sodium 134 - 144 mmol/L 144  138  136   Potassium 3.5 - 5.2 mmol/L 4.1  3.9  3.5   Chloride 96 - 106 mmol/L 108  104  106   CO2 20 - 29 mmol/L '21  26  21   '$ Calcium 8.6 - 10.2 mg/dL 9.2  9.5  9.1   Total Protein 6.0 - 8.5 g/dL 7.5  8.1    Total Bilirubin 0.0 - 1.2 mg/dL 0.3  0.5    Alkaline Phos 44 - 121 IU/L 119  136    AST 0 - 40 IU/L 19  18    ALT 0 - 44 IU/L 16  13      Lipid Panel     Component Value Date/Time   CHOL 218 (H) 12/07/2021 0908   TRIG 99 12/07/2021 0908   HDL 61 12/07/2021 0908   CHOLHDL 2.4 08/13/2020 1105   CHOLHDL 3.1 01/01/2015 0208   VLDL 12 01/01/2015 0208   LDLCALC 139 (H) 12/07/2021  0908    CBC    Component Value Date/Time   WBC 4.6 09/06/2021 0935   RBC 4.29 09/06/2021 0935   HGB 13.9 09/06/2021 0935   HGB 13.5 12/01/2020 1123   HCT 42.5 09/06/2021 0935   HCT 39.3 12/01/2020 1123   PLT 164.0 09/06/2021 0935   PLT 174 12/01/2020 1123   MCV 99.1 09/06/2021 0935   MCV 89 12/01/2020 1123   MCH 30.7 12/01/2020 1123   MCH 30.9 11/02/2020 0838   MCHC 32.7 09/06/2021 0935   RDW 14.7 09/06/2021 0935   RDW 12.9 12/01/2020 1123   LYMPHSABS 1.9 09/06/2021 0935   LYMPHSABS 1.2 12/01/2020 1123   MONOABS 0.4 09/06/2021 0935   EOSABS 0.6 09/06/2021 0935   EOSABS 0.3 12/01/2020 1123   BASOSABS 0.0 09/06/2021 0935   BASOSABS 0.0 12/01/2020 1123    Lab Results  Component Value Date   HGBA1C 5.8 (H) 12/07/2021    Assessment & Plan:  There are no diagnoses linked  to this encounter.  Health Care Maintenance: *** No orders of the defined types were placed in this encounter.   Follow-up: No follow-ups on file.       Charlott Rakes, MD, FAAFP. [facility] [place], [place]   06/07/2022, 1:50 PM

## 2022-06-07 NOTE — Progress Notes (Unsigned)
Out of breath when walking

## 2022-06-08 LAB — BASIC METABOLIC PANEL
BUN/Creatinine Ratio: 24 (ref 10–24)
BUN: 16 mg/dL (ref 8–27)
CO2: 16 mmol/L — ABNORMAL LOW (ref 20–29)
Calcium: 9.5 mg/dL (ref 8.6–10.2)
Chloride: 104 mmol/L (ref 96–106)
Creatinine, Ser: 0.66 mg/dL — ABNORMAL LOW (ref 0.76–1.27)
Glucose: 75 mg/dL (ref 70–99)
Sodium: 137 mmol/L (ref 134–144)
eGFR: 106 mL/min/{1.73_m2} (ref >59)

## 2022-06-08 LAB — PRO B NATRIURETIC PEPTIDE: NT-Pro BNP: 246 pg/mL — ABNORMAL HIGH (ref 0–210)

## 2022-06-09 ENCOUNTER — Telehealth: Payer: Self-pay

## 2022-06-09 ENCOUNTER — Telehealth: Payer: Self-pay | Admitting: Family Medicine

## 2022-06-09 NOTE — Telephone Encounter (Signed)
Noted  

## 2022-06-09 NOTE — Telephone Encounter (Signed)
Noted will reach out to patient to reschedule.     Copied from [place] [phone]. Topic: General - Call Back - No Documentation >> Jun 09, 2022 10:38 AM Cyndi Bender wrote: Reason for CRM: Pt stated he received a call from [facility] but he is having a problem with his phone. Pt requests that Angela Nevin return his call.

## 2022-06-09 NOTE — Telephone Encounter (Signed)
Pt is calling carla back. Please advise CB- [phone]

## 2022-06-12 NOTE — Progress Notes (Addendum)
Cardiology Office Note:    Date:  06/14/2022   ID:  [name], DOB 06-19-1960, MRN [id number]  PCP:  Charlott Rakes, MD  Cardiologist:  Elouise Munroe, MD  Electrophysiologist:  None   Referring MD: Charlott Rakes, MD   Chief Complaint/Reason for Referral: Follow up CAD and HLD  History of Present Illness:    [name] is a 62 y.o. male with a history of CAD (s/p BMS to D1 in 2015, noted to be chronically occluded by cath in 12/2014 with scattered 30% RCA stenosis, 20% LCx, and 50% Prox-Mid LAD stenosis at that time), PE/DVT (s/p IVC filter placement in 2012), COPD, chronic diastolic CHF (EF 0000000 by echo in 12/2014), HTN, HLD, chronic Hepatitis C, substance use (Cocaine - UDS from 01/2016 positive), and tobacco use.    08/13/2020: Since his prior visit he visited a neurologist and received two injections in his back. However, he was still experiencing right LE pain and bilateral LE weakness. He was also having significant difficulty with being able to stand, and was sometimes unable to stand. He planned to return to the neurologist on 08/31/2020. I strongly suspected that his lower extremity symptoms are non-vascular, and agree with continued management by neurology.  After about 50-75 yards of walking (such as lifting groceries) he developed mild chest pain/heaviness, shortness of breath, and had pre-syncopal feelings. When walking without carrying heavy objects he would feel better aside from his LE weakness.  He had not tried sublingual nitroglycerin during these episodes.  We had originally intended for him to be on 30 mg of Imdur but he was only taking half a tab 15 mg daily.  Unclear reason why, and we discussed increasing the dose back to 30 mg daily for continued management.  He was also not taking Lasix, and I suspect some of his symptoms may be related to elevated LV pressure with reduced EF.  I encouraged him to take Lasix particularly on days when he noticed the  increased chest pressure.  He had a nonischemic nuclear study, I was less concerned that this was related to high risk ischemia.  Symptoms may have been due to small vessel disease and residual CAD, medical management was indicated and we discussed increasing Imdur.  He remained compliant with his Repatha injections once every 2 weeks. At the time he was not taking Lasix at all due to polyuria.  For support he would stay connected with his 3 brothers and 1 sister.  06/14/2022: he complains of fatigue, shortness of breath, and chest discomfort with exertion. While walking in the store or down the street he notes he has to stop about 3-4 times. He also endorses associated leg pain while walking. He has been unable to exercise regularly due to these symptoms. He has not followed up with pulmonology or had a lung function test done since 2018. He is using his inhaler as needed and tends to carry it with him everywhere he goes.   He also complains of sinus allergies and follows up with Dr. Simona Huh. He reports he is not a candidate for allergy injections. He has been using a nasal spray to alleviate symptoms.   He continues to smoke. Yesterday he smoked 2 cigarettes. He reports sometimes smoking more than 2 cigarettes a day, and some days not smoking at all.   We discussed his recent labs from 11/2021. His LDL was 139. He was out of Repatha for at least 5 months at the time.  We also discussed his latest Echo from 01/2021.  He is requesting a refill on Zetia, Losartan, and Lasix. He is compliant with Eliquis, 5 mg twice daily. He is also taking Imdur 30 mg daily and notes it has been helping.   He reports a history of MI about 5-6 years ago. He states he stayed in hospital about 10-11 days.   He denies any palpitations or peripheral edema. No lightheadedness, headaches, syncope, orthopnea, or PND.  Past Medical History:  Diagnosis Date   Asthma    CAD (coronary artery disease)    a. 09/2013  NSTEMI/PCI: LM nl, LAD 40-50%, D1 100 (2.25 x 28 Vision BMS), LCX min irregs, RI 60-70, 30, RCA 40-50/50-30ms/p, EF 45-50%.  b. cath 12/2014 -occulded BMS in diag, 50% pro to mid LAD, 60% ramus, 30% RCA    Chest pain 08/2015   Chronic diastolic CHF (congestive heart failure) ([place])    a. 09/2014 EF 45-50% by LV gram;  b. 01/2014 Echo: EF 55-60%, Gr 1 DD.   Cocaine abuse ([place])    COPD (chronic obstructive pulmonary disease) ([place])    DVT (deep venous thrombosis) ([place])    "I had ~ 10 in each leg" (12/20/2016)   Essential hypertension    Hepatitis C    "clear free over a year now"   High cholesterol    Myocardial infarction [facility]) ~ 2014   Osteoarthritis    a. hands and toes.   Pneumonia    "several times" (12/20/2016)   Pulmonary embolism ([place])    a. 2012 - s/p IVC filter;  b. prev on eliquis - noncompliant.   Shortness of breath dyspnea    Sinus headache    Squamous cell skin cancer 07/13/2016   "foot"   Tobacco abuse     Past Surgical History:  Procedure Laterality Date   BACK SURGERY  2022   CARDIAC CATHETERIZATION N/A 01/04/2015   Procedure: Left Heart Cath and Coronary Angiography;  Surgeon: Burnell Blanks, MD;  Location: [place] [facility];  Service: Cardiovascular;  Laterality: N/A;   CORONARY ANGIOPLASTY WITH STENT PLACEMENT  10/21/2013   BMS to D1   CYST EXCISION N/A 05/10/2016   Procedure: EXCISION OF SEBACEOUS CYST UPPER BACK;  Surgeon: Donnie Mesa, MD;  Location: [place];  Service: General;  Laterality: N/A;   FOOT SURGERY Right ~ 06/2016   "said there was skin cancer on it"   LEFT HEART CATHETERIZATION WITH CORONARY ANGIOGRAM N/A 10/21/2013   Procedure: LEFT HEART CATHETERIZATION WITH CORONARY ANGIOGRAM;  Surgeon: Leonie Man, MD;  Location: [facility] CATH LAB;  Service: Cardiovascular;  Laterality: N/A;   VENA CAVA FILTER PLACEMENT  ~ 2007-~ 2017   "1 in my neck; 2 in my wrist"    Current Medications: Current Meds  Medication Sig   albuterol (PROAIR HFA) 108 (90  Base) MCG/ACT inhaler Inhale 2 puffs into the lungs every 6 (six) hours as needed for wheezing or shortness of breath.   albuterol (PROVENTIL) (2.5 MG/3ML) 0.083% nebulizer solution Take 3 mLs (2.5 mg total) by nebulization every 6 (six) hours as needed for wheezing or shortness of breath.   Azelastine-Fluticasone 137-50 MCG/ACT SUSP Place 2 sprays into the nose every 12 (twelve) hours.   cetirizine (ZYRTEC) 10 MG tablet Take 1 tablet (10 mg total) by mouth daily.   Fluticasone-Umeclidin-Vilant (TRELEGY ELLIPTA) 100-62.5-25 MCG/ACT AEPB Inhale 1 puff into the lungs daily.   montelukast (SINGULAIR) 10 MG tablet Take 1 tablet (10 mg total) by mouth  at bedtime.   pantoprazole (PROTONIX) 40 MG tablet Take 1 tablet (40 mg total) by mouth daily.   [DISCONTINUED] apixaban (ELIQUIS) 5 MG TABS tablet TAKE 1 TABLET (5 MG TOTAL) BY MOUTH 2 (TWO) TIMES DAILY.   [DISCONTINUED] empagliflozin (JARDIANCE) 10 MG TABS tablet Take 1 tablet (10 mg total) by mouth daily before breakfast.   [DISCONTINUED] ezetimibe (ZETIA) 10 MG tablet TAKE 1 TABLET (10 MG TOTAL) BY MOUTH DAILY.   [DISCONTINUED] furosemide (LASIX) 40 MG tablet Take 1 tablet (40 mg total) by mouth daily as needed (May take 40mg  Daily for Swelling and Shortness of breath).   [DISCONTINUED] isosorbide mononitrate (IMDUR) 30 MG 24 hr tablet Take 1 tablet (30 mg total) by mouth daily.   [DISCONTINUED] losartan (COZAAR) 25 MG tablet Take 1 tablet (25 mg total) by mouth daily.   [DISCONTINUED] nitroGLYCERIN (NITROSTAT) 0.4 MG SL tablet Place 1 tablet (0.4 mg total) under the tongue every 5 (five) minutes as needed for chest pain.    Allergies:   Atorvastatin   Social History   Tobacco Use   Smoking status: Every Day    Packs/day: 0.25    Years: 39.00    Additional pack years: 0.00    Total pack years: 9.75    Types: Cigarettes, Cigars    Passive exposure: Never   Smokeless tobacco: Never  Vaping Use   Vaping Use: Never used  Substance Use Topics    Alcohol use: Yes    Alcohol/week: 6.0 standard drinks of alcohol    Types: 2 Cans of beer, 4 Shots of liquor per week   Drug use: Not Currently    Types: Cocaine    Comment: 12/20/2016 "might have used some the other day; I'm not sure"     Family History: The patient's family history includes Allergies in his brother, mother, and sister; Asthma in his mother; Coronary artery disease in his father; Deep vein thrombosis in his brother; Heart attack in his father. There is no history of Colon cancer, Liver cancer, Esophageal cancer, Colon polyps, Rectal cancer, Pancreatic cancer, or Stomach cancer.  ROS:   Please see the history of present illness.    (+) Shortness of breath (with exertion) (+) Chest discomfort (with exertion) (+) Fatigue (with exertion) (+) Myalgias (leg pain while walking) All other systems reviewed and are negative.  EKGs/Labs/Other Studies Reviewed:    The following studies were reviewed today:  Echo 02/08/2021: IMPRESSIONS   1. Left ventricular ejection fraction, by estimation, is 40 to 45%. The  left ventricle has mildly decreased function. The left ventricle  demonstrates global hypokinesis. There is mild concentric left ventricular  hypertrophy. Left ventricular diastolic  parameters are consistent with Grade I diastolic dysfunction (impaired  relaxation).   2. Right ventricular systolic function is mildly reduced. The right  ventricular size is normal. Tricuspid regurgitation signal is inadequate  for assessing PA pressure.   3. Left atrial size was mildly dilated.   4. The mitral valve is normal in structure. No evidence of mitral valve  regurgitation.   5. The aortic valve is tricuspid. Aortic valve regurgitation is not  visualized. No aortic stenosis is present.   6. Aortic dilatation noted. There is mild dilatation of the aortic root,  measuring 40 mm.   7. The inferior vena cava is normal in size with greater than 50%  respiratory variability,  suggesting right atrial pressure of 3 mmHg.   Comparison(s): No significant change from prior study. Prior images  reviewed side by  side.   Echo 07/07/2020: 1. Left ventricular ejection fraction, by estimation, is 40 to 45%. The  left ventricle has mild to moderately decreased function. The left  ventricle demonstrates global hypokinesis. There is mild left ventricular  hypertrophy. Left ventricular diastolic  parameters are consistent with Grade I diastolic dysfunction (impaired  relaxation). There is abnormal septal motion.   2. Right ventricular systolic function is normal. The right ventricular  size is normal.   3. The mitral valve is normal in structure. Trivial mitral valve  regurgitation. No evidence of mitral stenosis.   4. The aortic valve is tricuspid. Aortic valve regurgitation is not  visualized. No aortic stenosis is present.   5. Aortic dilatation noted. There is mild dilatation of the aortic root  measuring 40 mm.   Comparison(s): A prior study was performed on 11/29/2017. No significant  change from prior study. Prior images reviewed side by side.   EKG: EKG is personally reviewed. 06/14/2022: Sinus bradycardia. 1st degree AV block. 08/13/2020: NSR, rate 66 bpm.   Recent Labs: 09/06/2021: Hemoglobin 13.9; Platelets 164.0 12/07/2021: ALT 16 06/07/2022: BUN 16; Creatinine, Ser 0.66; NT-Pro BNP 246; Potassium CANCELED; Sodium 137  Recent Lipid Panel    Component Value Date/Time   CHOL 218 (H) 12/07/2021 0908   TRIG 99 12/07/2021 0908   HDL 61 12/07/2021 0908   CHOLHDL 2.4 08/13/2020 1105   CHOLHDL 3.1 01/01/2015 0208   VLDL 12 01/01/2015 0208   LDLCALC 139 (H) 12/07/2021 0908    Physical Exam:    VS:  BP 110/78 (BP Location: Right Arm, Patient Position: Sitting, Cuff Size: Normal)   Pulse 62   Ht 6\' 2"  (1.88 m)   Wt 181 lb (82.1 kg)   BMI 23.24 kg/m     Wt Readings from Last 5 Encounters:  06/14/22 181 lb (82.1 kg)  06/07/22 176 lb 3.2 oz (79.9 kg)   01/25/22 180 lb (81.6 kg)  12/07/21 180 lb 12.8 oz (82 kg)  09/06/21 180 lb 2 oz (81.7 kg)    Constitutional: No acute distress Eyes: sclera non-icteric, normal conjunctiva and lids ENMT: normal dentition, moist mucous membranes Cardiovascular: regular rhythm, normal rate, no murmurs. S1 and S2 normal. Radial pulses normal bilaterally. No jugular venous distention.  Respiratory: clear to auscultation bilaterally GI : normal bowel sounds, soft and nontender. No distention.   MSK: extremities warm, well perfused. No edema.  Venous varicosities in the lower extremities. NEURO: grossly nonfocal exam, moves all extremities. PSYCH: alert and oriented x 3, normal mood and affect.   ASSESSMENT:    1. History of pulmonary embolism   2. Coronary artery disease involving native coronary artery of native heart without angina pectoris   3. Hyperlipidemia, unspecified hyperlipidemia type   4. Chronic combined systolic and diastolic congestive heart failure (HCC)   5. Chronic diastolic heart failure (HCC)     PLAN:    DOE - pulm referral, for copd eval given continued symptoms despite control of angina.  - repeat echo  Coronary artery disease involving native coronary artery of native heart with angina pectoris (HCC) Stable angina pectoris (HCC) - needs refill of all his cardiac meds, refill all today.  -continue eliquis, jardiance, repatha, lasix, imdur, losartan, nitro.   Hyperlipidemia, unspecified hyperlipidemia type Statin intolerance - repatha per CVRR, tolerating well prior to running out. LDL elevated, query duration of lack of medication.  -repeat lipids  Essential hypertension - bp stable today.   Chronic systolic heart failure (HCC) Heart Failure Therapy  ACE-I/ARB/ARNI: losartan 25 mg daily  BB: none MRA: none currently, could add spironolactone 25 mg daily if BP tolerates SGLT2I: jardiance 10 mg daily Diuretic plan: furosemide 40 mg as needed.  - repeat echo  Total  time of encounter: 30 minutes total time of encounter, including 20 minutes spent in face-to-face patient care on the date of this encounter. This time includes coordination of care and counseling regarding above mentioned problem list. Remainder of non-face-to-face time involved reviewing chart documents/testing relevant to the patient encounter and documentation in the medical record. I have independently reviewed documentation from referring provider.   Weston Brass, MD [place]  [facility]    Medication Adjustments/Labs and Tests Ordered: Current medicines are reviewed at length with the patient today.  Concerns regarding medicines are outlined above.   Orders Placed This Encounter  Procedures   Lipid panel   Lipoprotein A (LPA)   Comprehensive metabolic panel   Ambulatory referral to Pulmonology   Meds ordered this encounter  Medications   apixaban (ELIQUIS) 5 MG TABS tablet    Sig: TAKE 1 TABLET (5 MG TOTAL) BY MOUTH 2 (TWO) TIMES DAILY.    Dispense:  180 tablet    Refill:  1   empagliflozin (JARDIANCE) 10 MG TABS tablet    Sig: Take 1 tablet (10 mg total) by mouth daily before breakfast.    Dispense:  90 tablet    Refill:  3   Evolocumab 140 MG/ML SOAJ    Sig: INJECT 140 MG INTO THE SKIN EVERY 14 (FOURTEEN) DAYS.    Dispense:  6 mL    Refill:  3   ezetimibe (ZETIA) 10 MG tablet    Sig: TAKE 1 TABLET (10 MG TOTAL) BY MOUTH DAILY.    Dispense:  90 tablet    Refill:  3   furosemide (LASIX) 40 MG tablet    Sig: Take 1 tablet (40 mg total) by mouth daily as needed (May take 40mg  Daily for Swelling and Shortness of breath).    Dispense:  90 tablet    Refill:  1   isosorbide mononitrate (IMDUR) 30 MG 24 hr tablet    Sig: Take 1 tablet (30 mg total) by mouth daily.    Dispense:  90 tablet    Refill:  1   losartan (COZAAR) 25 MG tablet    Sig: Take 1 tablet (25 mg total) by mouth daily.    Dispense:  90 tablet    Refill:  3   nitroGLYCERIN (NITROSTAT) 0.4 MG SL  tablet    Sig: Place 1 tablet (0.4 mg total) under the tongue every 5 (five) minutes as needed for chest pain.    Dispense:  25 tablet    Refill:  3   Patient Instructions  Medication Instructions:  No Changes In Medications at this time.  *If you need a refill on your cardiac medications before your next appointment, please call your pharmacy*  Lab Work: Please return for Blood Work HERE OR AT PRIMARY CARE . No appointment needed, lab here at the office is open Monday-Friday from 8AM to 4PM and closed daily for lunch from 12:45-1:45.   If you have labs (blood work) drawn today and your tests are completely normal, you will receive your results only by: MyChart Message (if you have MyChart) OR A paper copy in the mail If you have any lab test that is abnormal or we need to change your treatment, we will call you to review the results.  Testing/Procedures: Your physician has requested that you have an echocardiogram AS SCHEDULED. Echocardiography is a painless test that uses sound waves to create images of your heart. It provides your doctor with information about the size and shape of your heart and how well your heart's chambers and valves are working. You may receive an ultrasound enhancing agent through an IV if needed to better visualize your heart during the echo.This procedure takes approximately one hour. There are no restrictions for this procedure. This will take place at the [address]    Follow-Up: At [facility], you and your health needs are our priority.  As part of our continuing mission to provide you with exceptional heart care, we have created designated Provider Care Teams.  These Care Teams include your primary Cardiologist (physician) and Advanced Practice Providers (APPs -  Physician Assistants and Nurse Practitioners) who all work together to provide you with the care you need, when you need it.  Your next appointment:   6 month(s) WITH  APP   Provider:   Parke PoissonGayatri A Kaytlen Lightsey, MD     REFERRAL TO PULMONARY THEY WILL CALL TO GET YOU SCHEDULED     I,Rachel Rivera,acting as a scribe for Parke PoissonGayatri A Izzabell Klasen, MD.,have documented all relevant documentation on the behalf of Parke PoissonGayatri A Arcadio Cope, MD,as directed by  Parke PoissonGayatri A Camdynn Maranto, MD while in the presence of Parke PoissonGayatri A Laquanda Bick, MD.  I, Parke PoissonGayatri A Shaquasha Gerstel, MD, have reviewed all documentation for the visit on 06/14/2022. The documentation on today's date of service for the exam, diagnosis, procedures, and orders are all accurate and complete.

## 2022-06-14 ENCOUNTER — Other Ambulatory Visit (HOSPITAL_COMMUNITY): Payer: Self-pay

## 2022-06-14 ENCOUNTER — Other Ambulatory Visit: Payer: Self-pay

## 2022-06-14 ENCOUNTER — Ambulatory Visit: Payer: 59 | Attending: Internal Medicine | Admitting: Internal Medicine

## 2022-06-14 ENCOUNTER — Encounter: Payer: Self-pay | Admitting: Internal Medicine

## 2022-06-14 DIAGNOSIS — I5032 Chronic diastolic (congestive) heart failure: Secondary | ICD-10-CM

## 2022-06-14 DIAGNOSIS — I2510 Atherosclerotic heart disease of native coronary artery without angina pectoris: Secondary | ICD-10-CM | POA: Diagnosis not present

## 2022-06-14 DIAGNOSIS — I5042 Chronic combined systolic (congestive) and diastolic (congestive) heart failure: Secondary | ICD-10-CM | POA: Diagnosis not present

## 2022-06-14 DIAGNOSIS — Z86711 Personal history of pulmonary embolism: Secondary | ICD-10-CM | POA: Diagnosis not present

## 2022-06-14 DIAGNOSIS — E785 Hyperlipidemia, unspecified: Secondary | ICD-10-CM | POA: Diagnosis not present

## 2022-06-14 MED ORDER — APIXABAN 5 MG PO TABS
ORAL_TABLET | Freq: Two times a day (BID) | ORAL | 1 refills | Status: DC
  Filled 2022-06-14 (×2): qty 180, 90d supply, fill #0
  Filled 2022-10-05: qty 180, 90d supply, fill #1

## 2022-06-14 MED ORDER — LOSARTAN POTASSIUM 25 MG PO TABS
25.0000 mg | ORAL_TABLET | Freq: Every day | ORAL | 3 refills | Status: DC
  Filled 2022-06-14 (×2): qty 90, 90d supply, fill #0
  Filled 2022-10-05: qty 90, 90d supply, fill #1
  Filled 2023-01-02: qty 90, 90d supply, fill #2

## 2022-06-14 MED ORDER — NITROGLYCERIN 0.4 MG SL SUBL
0.4000 mg | SUBLINGUAL_TABLET | SUBLINGUAL | 3 refills | Status: AC | PRN
  Filled 2022-06-14: qty 25, 7d supply, fill #0
  Filled 2022-06-14: qty 25, 30d supply, fill #0

## 2022-06-14 MED ORDER — EVOLOCUMAB 140 MG/ML ~~LOC~~ SOAJ
SUBCUTANEOUS | 3 refills | Status: DC
  Filled 2022-06-14 – 2022-06-15 (×3): qty 6, 84d supply, fill #0
  Filled 2022-09-22: qty 2, 28d supply, fill #1
  Filled 2022-10-30 (×2): qty 2, 28d supply, fill #2

## 2022-06-14 MED ORDER — EMPAGLIFLOZIN 10 MG PO TABS
10.0000 mg | ORAL_TABLET | Freq: Every day | ORAL | 3 refills | Status: DC
  Filled 2022-06-14 (×2): qty 90, 90d supply, fill #0
  Filled 2022-10-05: qty 90, 90d supply, fill #1
  Filled 2023-01-02: qty 90, 90d supply, fill #2

## 2022-06-14 MED ORDER — FUROSEMIDE 40 MG PO TABS
40.0000 mg | ORAL_TABLET | Freq: Every day | ORAL | 1 refills | Status: AC | PRN
  Filled 2022-06-14 (×2): qty 90, 90d supply, fill #0

## 2022-06-14 MED ORDER — EZETIMIBE 10 MG PO TABS
ORAL_TABLET | Freq: Every day | ORAL | 3 refills | Status: DC
  Filled 2022-06-14 (×2): qty 90, 90d supply, fill #0
  Filled 2022-10-05: qty 90, 90d supply, fill #1
  Filled 2023-01-02: qty 90, 90d supply, fill #2

## 2022-06-14 MED ORDER — ISOSORBIDE MONONITRATE ER 30 MG PO TB24
30.0000 mg | ORAL_TABLET | Freq: Every day | ORAL | 1 refills | Status: AC
  Filled 2022-06-14 (×2): qty 90, 90d supply, fill #0
  Filled 2022-10-05: qty 90, 90d supply, fill #1

## 2022-06-14 NOTE — Patient Instructions (Addendum)
Medication Instructions:  No Changes In Medications at this time.  *If you need a refill on your cardiac medications before your next appointment, please call your pharmacy*  Lab Work: Please return for Blood Work [place] . No appointment needed, lab here at the office is open Monday-Friday from 8AM to 4PM and closed daily for lunch from 12:45-1:45.   If you have labs (blood work) drawn today and your tests are completely normal, you will receive your results only by: [place] (if you have MyChart) OR A paper copy in the mail If you have any lab test that is abnormal or we need to change your treatment, we will call you to review the results.  Testing/Procedures: Your physician has requested that you have an echocardiogram AS SCHEDULED. Echocardiography is a painless test that uses sound waves to create images of your heart. It provides your doctor with information about the size and shape of your heart and how well your heart's chambers and valves are working. You may receive an ultrasound enhancing agent through an IV if needed to better visualize your heart during the echo.This procedure takes approximately one hour. There are no restrictions for this procedure. This will take place at the [address].    Follow-Up: At [facility], you and your health needs are our priority.  As part of our continuing mission to provide you with exceptional heart care, we have created designated Provider Care Teams.  These Care Teams include your primary Cardiologist (physician) and Advanced Practice Providers (APPs -  Physician Assistants and Nurse Practitioners) who all work together to provide you with the care you need, when you need it.  Your next appointment:   6 month(s) WITH APP   Provider:   Elouise Munroe, MD     REFERRAL TO PULMONARY THEY WILL CALL TO GET YOU SCHEDULED

## 2022-06-15 ENCOUNTER — Other Ambulatory Visit (HOSPITAL_COMMUNITY): Payer: Self-pay

## 2022-06-15 ENCOUNTER — Other Ambulatory Visit: Payer: Self-pay

## 2022-06-16 ENCOUNTER — Other Ambulatory Visit (HOSPITAL_COMMUNITY): Payer: Self-pay

## 2022-06-16 ENCOUNTER — Telehealth: Payer: Self-pay | Admitting: Internal Medicine

## 2022-06-16 NOTE — Telephone Encounter (Signed)
Pt c/o medication issue:  1. Name of Medication: Evolocumab 140 MG/ML SOAJ   2. How are you currently taking this medication (dosage and times per day)? As prescribed  3. Are you having a reaction (difficulty breathing--STAT)?   4. What is your medication issue? Patient states that he had not received this medication like he usually does and would like a call back to see if he is still to be taking this. Please advise.

## 2022-06-16 NOTE — Telephone Encounter (Signed)
Patient states receiving his medication from pharmacy yesterday but not the injections.   Called to pharmacy and stated that they shipped it yesterday but it is a refrigerated med so it comes separately.  It should be today in a larger, brown box. Called patient back and let him know regarding arrival and what to look for. He states understanding.

## 2022-06-20 ENCOUNTER — Telehealth: Payer: Self-pay | Admitting: Family Medicine

## 2022-06-20 NOTE — Telephone Encounter (Signed)
Contacted PRANEETH HILBURN to schedule their annual wellness visit. Appointment made for 06/21/22.  [facility] [employer] [facility] direct phone # [phone]

## 2022-06-21 ENCOUNTER — Ambulatory Visit: Payer: 59 | Attending: Family Medicine

## 2022-06-21 VITALS — Ht 74.0 in | Wt 182.0 lb

## 2022-06-21 DIAGNOSIS — Z0000 Encounter for general adult medical examination without abnormal findings: Secondary | ICD-10-CM

## 2022-06-21 NOTE — Progress Notes (Signed)
I connected with  ELIYAHU STANFORTH on 06/21/22 by a audio enabled telemedicine application and verified that I am speaking with the correct person using two identifiers.  Patient Location: Home  Provider Location: Office/Clinic  I discussed the limitations of evaluation and management by telemedicine. The patient expressed understanding and agreed to proceed.  Subjective:   CALIX MCNEISH is a 62 y.o. male who presents for an Initial Medicare Annual Wellness Visit.  Review of Systems     Cardiac Risk Factors include: advanced age (>79men, >54 women);dyslipidemia;hypertension;male gender;smoking/ tobacco exposure     Objective:    Today's Vitals   06/21/22 1427  Weight: 182 lb (82.6 kg)  Height: 6\' 2"  (1.88 m)   Body mass index is 23.37 kg/m.     06/21/2022    2:37 PM 04/13/2021    2:51 PM 09/14/2019    4:12 PM 05/01/2018    8:27 AM 04/06/2017   10:52 PM 12/20/2016    6:18 PM 05/18/2016    7:07 PM  Advanced Directives  Does Patient Have a Medical Advance Directive? No No No No No No No  Would patient like information on creating a medical advance directive?  No - Patient declined No - Patient declined  No - Patient declined No - Patient declined No - Patient declined    Current Medications (verified) Outpatient Encounter Medications as of 06/21/2022  Medication Sig   albuterol (PROAIR HFA) 108 (90 Base) MCG/ACT inhaler Inhale 2 puffs into the lungs every 6 (six) hours as needed for wheezing or shortness of breath.   albuterol (PROVENTIL) (2.5 MG/3ML) 0.083% nebulizer solution Take 3 mLs (2.5 mg total) by nebulization every 6 (six) hours as needed for wheezing or shortness of breath.   apixaban (ELIQUIS) 5 MG TABS tablet TAKE 1 TABLET (5 MG TOTAL) BY MOUTH 2 (TWO) TIMES DAILY.   Azelastine-Fluticasone 137-50 MCG/ACT SUSP Place 2 sprays into the nose every 12 (twelve) hours.   cetirizine (ZYRTEC) 10 MG tablet Take 1 tablet (10 mg total) by mouth daily.   empagliflozin  (JARDIANCE) 10 MG TABS tablet Take 1 tablet (10 mg total) by mouth daily before breakfast.   Evolocumab 140 MG/ML SOAJ INJECT 140 MG INTO THE SKIN EVERY 14 (FOURTEEN) DAYS.   ezetimibe (ZETIA) 10 MG tablet TAKE 1 TABLET (10 MG TOTAL) BY MOUTH DAILY.   Fluticasone-Umeclidin-Vilant (TRELEGY ELLIPTA) 100-62.5-25 MCG/ACT AEPB Inhale 1 puff into the lungs daily.   furosemide (LASIX) 40 MG tablet Take 1 tablet (40 mg total) by mouth daily as needed (May take 40mg  Daily for Swelling and Shortness of breath).   isosorbide mononitrate (IMDUR) 30 MG 24 hr tablet Take 1 tablet (30 mg total) by mouth daily.   losartan (COZAAR) 25 MG tablet Take 1 tablet (25 mg total) by mouth daily.   montelukast (SINGULAIR) 10 MG tablet Take 1 tablet (10 mg total) by mouth at bedtime.   nitroGLYCERIN (NITROSTAT) 0.4 MG SL tablet Place 1 tablet (0.4 mg total) under the tongue every 5 (five) minutes as needed for chest pain.   pantoprazole (PROTONIX) 40 MG tablet Take 1 tablet (40 mg total) by mouth daily.   Torsemide 40 MG TABS Take 1 tablet by mouth daily.   No facility-administered encounter medications on file as of 06/21/2022.    Allergies (verified) Atorvastatin   History: Past Medical History:  Diagnosis Date   Asthma    CAD (coronary artery disease)    a. 09/2013 NSTEMI/PCI: LM nl, LAD 40-50%, D1 100 (2.25  x 28 Vision BMS), LCX min irregs, RI 60-70, 30, RCA 40-50/50-67ms/p, EF 45-50%.  b. cath 12/2014 -occulded BMS in diag, 50% pro to mid LAD, 60% ramus, 30% RCA    Chest pain 08/2015   Chronic diastolic CHF (congestive heart failure) ([place])    a. 09/2014 EF 45-50% by LV gram;  b. 01/2014 Echo: EF 55-60%, Gr 1 DD.   Cocaine abuse ([place])    COPD (chronic obstructive pulmonary disease) ([place])    DVT (deep venous thrombosis) ([place])    "I had ~ 10 in each leg" (12/20/2016)   Essential hypertension    Hepatitis C    "clear free over a year now"   High cholesterol    Myocardial infarction [facility]) ~ 2014   Osteoarthritis     a. hands and toes.   Pneumonia    "several times" (12/20/2016)   Pulmonary embolism ([place])    a. 2012 - s/p IVC filter;  b. prev on eliquis - noncompliant.   Shortness of breath dyspnea    Sinus headache    Squamous cell skin cancer 07/13/2016   "foot"   Tobacco abuse    Past Surgical History:  Procedure Laterality Date   BACK SURGERY  2022   CARDIAC CATHETERIZATION N/A 01/04/2015   Procedure: Left Heart Cath and Coronary Angiography;  Surgeon: Burnell Blanks, MD;  Location: [place] [facility];  Service: Cardiovascular;  Laterality: N/A;   CORONARY ANGIOPLASTY WITH STENT PLACEMENT  10/21/2013   BMS to D1   CYST EXCISION N/A 05/10/2016   Procedure: EXCISION OF SEBACEOUS CYST UPPER BACK;  Surgeon: Donnie Mesa, MD;  Location: [place];  Service: General;  Laterality: N/A;   FOOT SURGERY Right ~ 06/2016   "said there was skin cancer on it"   LEFT HEART CATHETERIZATION WITH CORONARY ANGIOGRAM N/A 10/21/2013   Procedure: LEFT HEART CATHETERIZATION WITH CORONARY ANGIOGRAM;  Surgeon: Leonie Man, MD;  Location: [facility];  Service: Cardiovascular;  Laterality: N/A;   VENA CAVA FILTER PLACEMENT  ~ 2007-~ 2017   "1 in my neck; 2 in my wrist"   Family History  Problem Relation Age of Onset   Asthma Mother    Allergies Mother    Allergies Sister    Allergies Brother    Deep vein thrombosis Brother        two brothers with recurrent DVT   Heart attack Father        a.60s b. deceased in his 35s   Coronary artery disease Father    Colon cancer Neg Hx    Liver cancer Neg Hx    Esophageal cancer Neg Hx    Colon polyps Neg Hx    Rectal cancer Neg Hx    Pancreatic cancer Neg Hx    Stomach cancer Neg Hx    Social History   Socioeconomic History   Marital status: Single    Spouse name: Not on file   Number of children: Not on file   Years of education: Not on file   Highest education level: Not on file  Occupational History   Occupation: unemployed  Tobacco Use    Smoking status: Every Day    Packs/day: 0.25    Years: 39.00    Additional pack years: 0.00    Total pack years: 9.75    Types: Cigarettes, Cigars    Passive exposure: Never   Smokeless tobacco: Never  Vaping Use   Vaping Use: Never used  Substance and Sexual Activity  Alcohol use: Yes    Alcohol/week: 6.0 standard drinks of alcohol    Types: 2 Cans of beer, 4 Shots of liquor per week   Drug use: Not Currently    Types: Cocaine    Comment: 12/20/2016 "might have used some the other day; I'm not sure"   Sexual activity: Not Currently  Other Topics Concern   Not on file  Social History Narrative   Lives in [place].   Social Determinants of Health   Financial Resource Strain: Low Risk  (06/21/2022)   Overall Financial Resource Strain (CARDIA)    Difficulty of Paying Living Expenses: Not hard at all  Food Insecurity: No Food Insecurity (06/21/2022)   Hunger Vital Sign    Worried About Running Out of Food in the Last Year: Never true    Ran Out of Food in the Last Year: Never true  Transportation Needs: No Transportation Needs (06/21/2022)   PRAPARE - Hydrologist (Medical): No    Lack of Transportation (Non-Medical): No  Physical Activity: Inactive (06/21/2022)   Exercise Vital Sign    Days of Exercise per Week: 0 days    Minutes of Exercise per Session: 0 min  Stress: No Stress Concern Present (06/21/2022)   [place]    Feeling of Stress : Not at all  Social Connections: Not on file    Tobacco Counseling Ready to quit: Yes Counseling given: Not Answered   Clinical Intake:  Pre-visit preparation completed: Yes  Pain : No/denies pain     Nutritional Status: BMI of 19-24  Normal Nutritional Risks: None Diabetes: No  How often do you need to have someone help you when you read instructions, pamphlets, or other written materials from your doctor or pharmacy?: 1 -  Never  Diabetic? no  Interpreter Needed?: No  Information entered by :: NAllen LPN   Activities of Daily Living    06/21/2022    2:39 PM  In your present state of health, do you have any difficulty performing the following activities:  Hearing? 1  Vision? 0  Difficulty concentrating or making decisions? 0  Walking or climbing stairs? 1  Dressing or bathing? 0  Doing errands, shopping? 0  Preparing Food and eating ? N  Using the Toilet? N  In the past six months, have you accidently leaked urine? Y  Do you have problems with loss of bowel control? N  Managing your Medications? N  Managing your Finances? N  Housekeeping or managing your Housekeeping? N    Patient Care Team: Charlott Rakes, MD as PCP - General (Family Medicine) Elouise Munroe, MD as PCP - Cardiology (Cardiology)  Indicate any recent Medical Services you may have received from other than [facility] providers in the past year (date may be approximate).     Assessment:   This is a routine wellness examination for Anubis.  Hearing/Vision screen Vision Screening - Comments:: No regular eye exams  Dietary issues and exercise activities discussed: Current Exercise Habits: The patient does not participate in regular exercise at present   Goals Addressed             This Visit's Progress    Patient Stated       06/21/2022, no goals       Depression Screen    06/21/2022    2:39 PM 12/07/2021    8:43 AM 12/30/2020   10:19 AM 01/01/2020    9:10 AM  04/21/2019    1:47 PM 03/06/2018    2:01 PM 11/20/2017   10:09 AM  PHQ 2/9 Scores  PHQ - 2 Score 0 2 1 1 1 1 1   PHQ- 9 Score  4 3 5 5 5 5     Fall Risk    06/21/2022    2:38 PM 06/07/2022    1:45 PM 12/07/2021    8:43 AM 12/30/2020   10:14 AM 01/01/2020    8:44 AM  Fall Risk   Falls in the past year? 0 0 0 0 1  Number falls in past yr: 0 0 0 0 1  Injury with Fall? 0 0 0 0 0  Risk for fall due to : Medication side effect No Fall Risks No Fall Risks     Follow up Falls prevention discussed;Education provided;Falls evaluation completed        FALL RISK PREVENTION PERTAINING TO THE HOME:  Any stairs in or around the home? No  If so, are there any without handrails? N/a Home free of loose throw rugs in walkways, pet beds, electrical cords, etc? Yes  Adequate lighting in your home to reduce risk of falls? Yes   ASSISTIVE DEVICES UTILIZED TO PREVENT FALLS:  Life alert? No  Use of a cane, walker or w/c? No  Grab bars in the bathroom? Yes  Shower chair or bench in shower? No  Elevated toilet seat or a handicapped toilet? No   TIMED UP AND GO:  Was the test performed? No .      Cognitive Function:        06/21/2022    2:41 PM  6CIT Screen  What Year? 0 points  What month? 0 points  What time? 0 points  Count back from 20 0 points  Months in reverse 4 points  Repeat phrase 0 points  Total Score 4 points    Immunizations Immunization History  Administered Date(s) Administered   Hepatitis B, ADULT 02/24/2015, 04/08/2015   Influenza Split 02/11/2012   Influenza,inj,Quad PF,6+ Mos 02/22/2013, 01/27/2014, 12/02/2014, 02/03/2016, 12/21/2016, 03/06/2018, 01/21/2019, 01/01/2020, 12/01/2020, 12/07/2021   PFIZER(Purple Top)SARS-COV-2 Vaccination 06/28/2019, 07/19/2019   Pneumococcal Polysaccharide-23 03/24/2012, 01/02/2015   Tdap 04/21/2019   Zoster Recombinat (Shingrix) 04/21/2019, 01/01/2020    TDAP status: Up to date  Flu Vaccine status: Up to date  Pneumococcal vaccine status: Up to date  Covid-19 vaccine status: Completed vaccines  Qualifies for Shingles Vaccine? Yes   Zostavax completed Yes   Shingrix Completed?: Yes  Screening Tests Health Maintenance  Topic Date Due   COVID-19 Vaccine (3 - Pfizer risk series) 08/16/2019   COLONOSCOPY (Pts 45-42yrs Insurance coverage will need to be confirmed)  05/01/2021   Lung Cancer Screening  01/11/2022   Medicare Annual Wellness (AWV)  06/21/2023   DTaP/Tdap/Td (2 -  Td or Tdap) 04/20/2029   INFLUENZA VACCINE  Completed   Hepatitis C Screening  Completed   HIV Screening  Completed   Zoster Vaccines- Shingrix  Completed   HPV VACCINES  Aged Out    Health Maintenance  Health Maintenance Due  Topic Date Due   COVID-19 Vaccine (3 - Pfizer risk series) 08/16/2019   COLONOSCOPY (Pts 45-93yrs Insurance coverage will need to be confirmed)  05/01/2021   Lung Cancer Screening  01/11/2022    Colorectal cancer screening: Type of screening: Colonoscopy. Completed 05/01/2018. Repeat every 3 years  Lung Cancer Screening: (Low Dose CT Chest recommended if Age 82-80 years, 30 pack-year currently smoking OR have quit w/in 15years.) does  qualify.   Lung Cancer Screening Referral: scheduled for 07/21/2022  Additional Screening:  Hepatitis C Screening: does qualify; Completed 03/28/2016  Vision Screening: Recommended annual ophthalmology exams for early detection of glaucoma and other disorders of the eye. Is the patient up to date with their annual eye exam?  No  Who is the provider or what is the name of the office in which the patient attends annual eye exams? none If pt is not established with a provider, would they like to be referred to a provider to establish care? No .   Dental Screening: Recommended annual dental exams for proper oral hygiene  Community Resource Referral / Chronic Care Management: CRR required this visit?  No   CCM required this visit?  No      Plan:     I have personally reviewed and noted the following in the patient's chart:   Medical and social history Use of alcohol, tobacco or illicit drugs  Current medications and supplements including opioid prescriptions. Patient is not currently taking opioid prescriptions. Functional ability and status Nutritional status Physical activity Advanced directives List of other physicians Hospitalizations, surgeries, and ER visits in previous 12 months Vitals Screenings to include  cognitive, depression, and falls Referrals and appointments  In addition, I have reviewed and discussed with patient certain preventive protocols, quality metrics, and best practice recommendations. A written personalized care plan for preventive services as well as general preventive health recommendations were provided to patient.     Kellie Simmering, LPN   [id number]   Nurse Notes: none  Due to this being a virtual visit, the after visit summary with patients personalized plan was offered to patient via mail or my-chart.  to pick up at office at next visit

## 2022-06-21 NOTE — Patient Instructions (Signed)
[name] , Thank you for taking time to come for your Medicare Wellness Visit. I appreciate your ongoing commitment to your health goals. Please review the following plan we discussed and let me know if I can assist you in the future.   These are the goals we discussed:  Goals      Patient Stated     06/21/2022, no goals        This is a list of the screening recommended for you and due dates:  Health Maintenance  Topic Date Due   COVID-19 Vaccine (3 - Pfizer risk series) 08/16/2019   Colon Cancer Screening  05/01/2021   Screening for Lung Cancer  01/11/2022   Medicare Annual Wellness Visit  06/21/2023   DTaP/Tdap/Td vaccine (2 - Td or Tdap) 04/20/2029   Flu Shot  Completed   Hepatitis C Screening: USPSTF Recommendation to screen - Ages 18-79 yo.  Completed   HIV Screening  Completed   Zoster (Shingles) Vaccine  Completed   HPV Vaccine  Aged Out    Advanced directives: Advance directive discussed with you today. .  Conditions/risks identified: smoking  Next appointment: Follow up in one year for your annual wellness visit   Preventive Care 40-64 Years, Male Preventive care refers to lifestyle choices and visits with your health care provider that can promote health and wellness. What does preventive care include? A yearly physical exam. This is also called an annual well check. Dental exams once or twice a year. Routine eye exams. Ask your health care provider how often you should have your eyes checked. Personal lifestyle choices, including: Daily care of your teeth and gums. Regular physical activity. Eating a healthy diet. Avoiding tobacco and drug use. Limiting alcohol use. Practicing safe sex. Taking low-dose aspirin every day starting at age 35. What happens during an annual well check? The services and screenings done by your health care provider during your annual well check will depend on your age, overall health, lifestyle risk factors, and family history of  disease. Counseling  Your health care provider may ask you questions about your: Alcohol use. Tobacco use. Drug use. Emotional well-being. Home and relationship well-being. Sexual activity. Eating habits. Work and work Statistician. Screening  You may have the following tests or measurements: Height, weight, and BMI. Blood pressure. Lipid and cholesterol levels. These may be checked every 5 years, or more frequently if you are over 63 years old. Skin check. Lung cancer screening. You may have this screening every year starting at age 16 if you have a 30-pack-year history of smoking and currently smoke or have quit within the past 15 years. Fecal occult blood test (FOBT) of the stool. You may have this test every year starting at age 59. Flexible sigmoidoscopy or colonoscopy. You may have a sigmoidoscopy every 5 years or a colonoscopy every 10 years starting at age 88. Prostate cancer screening. Recommendations will vary depending on your family history and other risks. Hepatitis C blood test. Hepatitis B blood test. Sexually transmitted disease (STD) testing. Diabetes screening. This is done by checking your blood sugar (glucose) after you have not eaten for a while (fasting). You may have this done every 1-3 years. Discuss your test results, treatment options, and if necessary, the need for more tests with your health care provider. Vaccines  Your health care provider may recommend certain vaccines, such as: Influenza vaccine. This is recommended every year. Tetanus, diphtheria, and acellular pertussis (Tdap, Td) vaccine. You may need a Td  booster every 10 years. Zoster vaccine. You may need this after age 36. Pneumococcal 13-valent conjugate (PCV13) vaccine. You may need this if you have certain conditions and have not been vaccinated. Pneumococcal polysaccharide (PPSV23) vaccine. You may need one or two doses if you smoke cigarettes or if you have certain conditions. Talk to your  health care provider about which screenings and vaccines you need and how often you need them. This information is not intended to replace advice given to you by your health care provider. Make sure you discuss any questions you have with your health care provider. Document Released: 04/09/2015 Document Revised: 12/01/2015 Document Reviewed: 01/12/2015 Elsevier Interactive Patient Education  2017 [place] Prevention in the Home Falls can cause injuries. They can happen to people of all ages. There are many things you can do to make your home safe and to help prevent falls. What can I do on the outside of my home? Regularly fix the edges of walkways and driveways and fix any cracks. Remove anything that might make you trip as you walk through a door, such as a raised step or threshold. Trim any bushes or trees on the path to your home. Use bright outdoor lighting. Clear any walking paths of anything that might make someone trip, such as rocks or tools. Regularly check to see if handrails are loose or broken. Make sure that both sides of any steps have handrails. Any raised decks and porches should have guardrails on the edges. Have any leaves, snow, or ice cleared regularly. Use sand or salt on walking paths during winter. Clean up any spills in your garage right away. This includes oil or grease spills. What can I do in the bathroom? Use night lights. Install grab bars by the toilet and in the tub and shower. Do not use towel bars as grab bars. Use non-skid mats or decals in the tub or shower. If you need to sit down in the shower, use a plastic, non-slip stool. Keep the floor dry. Clean up any water that spills on the floor as soon as it happens. Remove soap buildup in the tub or shower regularly. Attach bath mats securely with double-sided non-slip rug tape. Do not have throw rugs and other things on the floor that can make you trip. What can I do in the bedroom? Use night  lights. Make sure that you have a light by your bed that is easy to reach. Do not use any sheets or blankets that are too big for your bed. They should not hang down onto the floor. Have a firm chair that has side arms. You can use this for support while you get dressed. Do not have throw rugs and other things on the floor that can make you trip. What can I do in the kitchen? Clean up any spills right away. Avoid walking on wet floors. Keep items that you use a lot in easy-to-reach places. If you need to reach something above you, use a strong step stool that has a grab bar. Keep electrical cords out of the way. Do not use floor polish or wax that makes floors slippery. If you must use wax, use non-skid floor wax. Do not have throw rugs and other things on the floor that can make you trip. What can I do with my stairs? Do not leave any items on the stairs. Make sure that there are handrails on both sides of the stairs and use them. Fix handrails that  are broken or loose. Make sure that handrails are as long as the stairways. Check any carpeting to make sure that it is firmly attached to the stairs. Fix any carpet that is loose or worn. Avoid having throw rugs at the top or bottom of the stairs. If you do have throw rugs, attach them to the floor with carpet tape. Make sure that you have a light switch at the top of the stairs and the bottom of the stairs. If you do not have them, ask someone to add them for you. What else can I do to help prevent falls? Wear shoes that: Do not have high heels. Have rubber bottoms. Are comfortable and fit you well. Are closed at the toe. Do not wear sandals. If you use a stepladder: Make sure that it is fully opened. Do not climb a closed stepladder. Make sure that both sides of the stepladder are locked into place. Ask someone to hold it for you, if possible. Clearly mark and make sure that you can see: Any grab bars or handrails. First and last  steps. Where the edge of each step is. Use tools that help you move around (mobility aids) if they are needed. These include: Canes. Walkers. Scooters. Crutches. Turn on the lights when you go into a dark area. Replace any light bulbs as soon as they burn out. Set up your furniture so you have a clear path. Avoid moving your furniture around. If any of your floors are uneven, fix them. If there are any pets around you, be aware of where they are. Review your medicines with your doctor. Some medicines can make you feel dizzy. This can increase your chance of falling. Ask your doctor what other things that you can do to help prevent falls. This information is not intended to replace advice given to you by your health care provider. Make sure you discuss any questions you have with your health care provider. Document Released: 01/07/2009 Document Revised: 08/19/2015 Document Reviewed: 04/17/2014 Elsevier Interactive Patient Education  2017 [employer].

## 2022-06-28 ENCOUNTER — Ambulatory Visit (HOSPITAL_COMMUNITY)
Admission: RE | Admit: 2022-06-28 | Discharge: 2022-06-28 | Disposition: A | Discharge status: Home / Self Care | Payer: 59 | Source: Ambulatory Visit | Attending: Family Medicine | Admitting: Family Medicine

## 2022-06-28 DIAGNOSIS — I77819 Aortic ectasia, unspecified site: Secondary | ICD-10-CM | POA: Insufficient documentation

## 2022-06-28 DIAGNOSIS — I5042 Chronic combined systolic (congestive) and diastolic (congestive) heart failure: Secondary | ICD-10-CM

## 2022-06-28 DIAGNOSIS — I110 Hypertensive heart disease with heart failure: Secondary | ICD-10-CM | POA: Diagnosis not present

## 2022-06-28 DIAGNOSIS — F17200 Nicotine dependence, unspecified, uncomplicated: Secondary | ICD-10-CM | POA: Diagnosis not present

## 2022-06-28 DIAGNOSIS — I509 Heart failure, unspecified: Secondary | ICD-10-CM | POA: Insufficient documentation

## 2022-06-28 DIAGNOSIS — F1410 Cocaine abuse, uncomplicated: Secondary | ICD-10-CM | POA: Diagnosis not present

## 2022-06-28 LAB — ECHOCARDIOGRAM COMPLETE
AR max vel: 4.68 cm2
AV Area VTI: 4.5 cm2
AV Area mean vel: 4.34 cm2
AV Mean grad: 1 mmHg
AV Peak grad: 2.5 mmHg
Ao pk vel: 0.8 m/s
Area-P 1/2: 2.93 cm2
Calc EF: 42.1 %
S' Lateral: 4.2 cm
Single Plane A2C EF: 42.1 %
Single Plane A4C EF: 39.5 %

## 2022-06-30 ENCOUNTER — Ambulatory Visit (INDEPENDENT_AMBULATORY_CARE_PROVIDER_SITE_OTHER): Payer: 59 | Admitting: Podiatry

## 2022-06-30 ENCOUNTER — Encounter: Payer: Self-pay | Admitting: Podiatry

## 2022-06-30 ENCOUNTER — Telehealth: Payer: Self-pay | Admitting: Family Medicine

## 2022-06-30 DIAGNOSIS — Z7901 Long term (current) use of anticoagulants: Secondary | ICD-10-CM | POA: Diagnosis not present

## 2022-06-30 DIAGNOSIS — M79675 Pain in left toe(s): Secondary | ICD-10-CM | POA: Diagnosis not present

## 2022-06-30 DIAGNOSIS — B351 Tinea unguium: Secondary | ICD-10-CM

## 2022-06-30 DIAGNOSIS — M79674 Pain in right toe(s): Secondary | ICD-10-CM | POA: Diagnosis not present

## 2022-06-30 NOTE — Progress Notes (Signed)
This patient returns to my office for at risk foot care.  This patient requires this care by a professional since this patient will be at risk due to having coagulation defect. Patient is taking eliquis. This patient is unable to cut nails himself since the patient cannot reach his nails.These nails are painful walking and wearing shoes.  This patient presents for at risk foot care today.  General Appearance  Alert, conversant and in no acute stress.  Vascular  Dorsalis pedis and posterior tibial  pulses are palpable  bilaterally.  Capillary return is within normal limits  bilaterally. Temperature is within normal limits  bilaterally.  Neurologic  Senn-Weinstein monofilament wire test within normal limits  bilaterally. Muscle power within normal limits bilaterally.  Nails Thick disfigured discolored nails with subungual debris  from hallux to fifth toes bilaterally. No evidence of bacterial infection or drainage bilaterally.  Orthopedic  No limitations of motion  feet .  No crepitus or effusions noted.  No bony pathology or digital deformities noted.  Skin  normotropic skin with no porokeratosis noted bilaterally.  No signs of infections or ulcers noted.     Onychomycosis  Pain in right toes  Pain in left toes  Consent was obtained for treatment procedures.   Mechanical debridement of nails 1-5  bilaterally performed with a nail nipper.  Filed with dremel without incident.    Return office visit    4 months                  Told patient to return for periodic foot care and evaluation due to potential at risk complications.   Cameran Ahmed DPM  

## 2022-06-30 NOTE — Telephone Encounter (Signed)
Pt's call was returned

## 2022-06-30 NOTE — Telephone Encounter (Signed)
Copied from CRM [id number]. Topic: General - Other >> Jun 30, 2022 12:12 PM Santiya F wrote: Reason for CRM: Pt is returning a call from the office.

## 2022-07-04 ENCOUNTER — Telehealth: Payer: Self-pay | Admitting: Internal Medicine

## 2022-07-04 ENCOUNTER — Ambulatory Visit: Payer: Self-pay | Admitting: *Deleted

## 2022-07-04 NOTE — Telephone Encounter (Signed)
Summary: pt eyes are puffy   Pt called saying his eyes are puffy in the mornings.  He ask for prednisone Please advise  CB#  [phone]         Attempted to call patient- left message to call office regarding symptoms

## 2022-07-04 NOTE — Telephone Encounter (Signed)
Patient stated his PCP gave him results of his Echocardiogram test and he just wanted to report that the results were the same as before.

## 2022-07-04 NOTE — Telephone Encounter (Signed)
Called pt, nothing else to add too this message he just wanted to let Dr. Jacques Navy know the results were in.

## 2022-07-04 NOTE — Telephone Encounter (Signed)
Summary: pt eyes are puffy   Pt called saying his eyes are puffy in the mornings.  He ask for prednisone Please advise  CB#  [phone]           Chief Complaint: eyelids swollen  Symptoms: upper eyelids swollen 3-4 months , some itching.  Frequency: 3-4 months  Pertinent Negatives: Patient denies fever no drainage no pain no redness reported  Disposition: [] ED /[] Urgent Care (no appt availability in office) / [] Appointment(In office/virtual)/ []  [facility]/ [] Home Care/ [] Refused Recommended Disposition /[] [facility]/ [x]  Follow-up with PCP Additional Notes:    Requesting medication prednisone. No available appt until 07/19/22. Unsure if patient will try mobile bus . Patient placed on waiting list. Please advise if earlier appt available. Patient reports last OV 06/07/22 forgot to mention sx. Please advise .       Reason for Disposition  [1] MILD eyelid swelling (puffiness) AND [2] persists > 3 days  (Exception: Suspect mosquito bites.)  Answer Assessment - Initial Assessment Questions 1. ONSET: "When did the swelling start?" (e.g., minutes, hours, days)     3 -4 months  2. LOCATION: "What part of the eyelids is swollen?"     Upper lids  3. SEVERITY: "How swollen is it?"     Can see  4. ITCHING: "Is there any itching?" If Yes, ask: "How much?"   (Scale 1-10; mild, moderate or severe)     sometimes 5. PAIN: "Is the swelling painful to touch?" If Yes, ask: "How painful is it?"   (Scale 1-10; mild, moderate or severe)     No  6. FEVER: "Do you have a fever?" If Yes, ask: "What is it, how was it measured, and when did it start?"      No  7. CAUSE: "What do you think is causing the swelling?"     Not sure  8. RECURRENT SYMPTOM: "Have you had eyelid swelling before?" If Yes, ask: "When was the last time?" "What happened that time?"     Yes took prednisone and helped for a few years  9. OTHER SYMPTOMS: "Do you have any other symptoms?" (e.g., blurred  vision, eye discharge, rash, runny nose)     No drainage, no blurred vision no runny nose  10. PREGNANCY: "Is there any chance you are pregnant?" "When was your last menstrual period?"       na  Protocols used: Eye - Swelling-A-AH

## 2022-07-05 NOTE — Telephone Encounter (Signed)
Patient requesting medication for prednisone. Patient voiced his eyes are puffy and feels prednisone will help. Patient is taking Zyrtec and Azelastine-fluticasone as ordered

## 2022-07-06 ENCOUNTER — Encounter (INDEPENDENT_AMBULATORY_CARE_PROVIDER_SITE_OTHER): Payer: 59 | Admitting: Primary Care

## 2022-07-09 NOTE — Progress Notes (Signed)
No show

## 2022-07-11 ENCOUNTER — Other Ambulatory Visit: Payer: Self-pay

## 2022-07-11 ENCOUNTER — Ambulatory Visit (INDEPENDENT_AMBULATORY_CARE_PROVIDER_SITE_OTHER): Payer: 59 | Admitting: Internal Medicine

## 2022-07-11 ENCOUNTER — Ambulatory Visit: Payer: Self-pay

## 2022-07-11 ENCOUNTER — Encounter: Payer: Self-pay | Admitting: Internal Medicine

## 2022-07-11 VITALS — BP 100/64 | HR 85 | Temp 98.0°F | Ht 74.0 in | Wt 175.8 lb

## 2022-07-11 DIAGNOSIS — F17210 Nicotine dependence, cigarettes, uncomplicated: Secondary | ICD-10-CM | POA: Diagnosis not present

## 2022-07-11 DIAGNOSIS — R5383 Other fatigue: Secondary | ICD-10-CM

## 2022-07-11 DIAGNOSIS — J439 Emphysema, unspecified: Secondary | ICD-10-CM | POA: Diagnosis not present

## 2022-07-11 DIAGNOSIS — R0602 Shortness of breath: Secondary | ICD-10-CM | POA: Diagnosis not present

## 2022-07-11 DIAGNOSIS — J4489 Other specified chronic obstructive pulmonary disease: Secondary | ICD-10-CM | POA: Diagnosis not present

## 2022-07-11 NOTE — Progress Notes (Signed)
[name]    [id number]    06-Jun-1960  Primary Care Physician:Newlin, Odette Horns, MD  Referring Physician: Parke Poisson, MD [address] [place],  [place] [address] Reason for Consultation: shortness of breath Date of Consultation: 07/11/2022  Chief complaint:   Chief Complaint  Patient presents with   Consult    complains of fatigue, shortness of breath, and chest discomfort with exertion. While walking in the store or down the street he notes he has to stop about 3-4 times.  Last seen Dr. Isaiah Serge 2018.     HPI: [name] is a 62 y.o. man who presents for new patient evaluation for shortness of breath and fatigue with exertion for the past 10-15 years.  Past medical history of DVT on eliquis, HFrEF EF 40-45%.   Denies coughing and chest tightness but does have wheezing.   No recurrent bronchitis but was hospitalized in 2018 for hypoxemic and hypercapnic respiratory failure copd exacerbation.  No childhood respiratory disease.   On trelegy 1 puff daily usually takes 3-4 days out of the week. Takes albuterol three times a week as needed.   Says he feels unrefreshed when he wakes up in the morning.  Denies snoring or witnessed apneas. Denies excessive daytime sleepiness.   Has difficulty getting up from a chair position. Cannot go up stairs. He feels his legs are going to give out and he's short of breath. He infrequent falls. Does not take nitroglycerin for these episodes. Albuterol does not improve these symptoms.   Appetite is a little low. After his back surgery he lost about 35 lbs without meaning to.   Has dymista nasal spray which he takes as need. Takes singulair daily. Takes zyrtec daily.   Walks to get groceries at [employer] but gets tired.  Symptoms worse walking up hill.   Social history:  Occupation: on disability for heart disease, asthma. Used to work at [employer].  Exposures: lives at home independently Smoking  history: 35 pack years, started at age 28, now down to 1/2 ppd.   Social History   Occupational History   Occupation: unemployed  Tobacco Use   Smoking status: Every Day    Packs/day: 1.00    Years: 35.00    Additional pack years: 0.00    Total pack years: 35.00    Types: Cigarettes    Passive exposure: Never   Smokeless tobacco: Never  Vaping Use   Vaping Use: Never used  Substance and Sexual Activity   Alcohol use: Yes    Alcohol/week: 6.0 standard drinks of alcohol    Types: 2 Cans of beer, 4 Shots of liquor per week   Drug use: Not Currently    Types: Cocaine    Comment: 12/20/2016 "might have used some the other day; I'm not sure"   Sexual activity: Not Currently    Relevant family history:  Family History  Problem Relation Age of Onset   Asthma Mother    Allergies Mother    Allergies Sister    Allergies Brother    Deep vein thrombosis Brother        two brothers with recurrent DVT   Heart attack Father        a.60s b. deceased in his 28s   Coronary artery disease Father    Colon cancer Neg Hx    Liver cancer Neg Hx    Esophageal cancer Neg Hx    Colon  polyps Neg Hx    Rectal cancer Neg Hx    Pancreatic cancer Neg Hx    Stomach cancer Neg Hx     Past Medical History:  Diagnosis Date   Asthma    CAD (coronary artery disease)    a. 09/2013 NSTEMI/PCI: LM nl, LAD 40-50%, D1 100 (2.25 x 28 Vision BMS), LCX min irregs, RI 60-70, 30, RCA 40-50/50-53ms/p, EF 45-50%.  b. cath 12/2014 -occulded BMS in diag, 50% pro to mid LAD, 60% ramus, 30% RCA    Chest pain 08/2015   Chronic diastolic CHF (congestive heart failure)    a. 09/2014 EF 45-50% by LV gram;  b. 01/2014 Echo: EF 55-60%, Gr 1 DD.   Cocaine abuse    COPD (chronic obstructive pulmonary disease)    DVT (deep venous thrombosis)    "I had ~ 10 in each leg" (12/20/2016)   Essential hypertension    Hepatitis C    "clear free over a year now"   High cholesterol    Myocardial infarction ~ 2014    Osteoarthritis    a. hands and toes.   Pneumonia    "several times" (12/20/2016)   Pulmonary embolism    a. 2012 - s/p IVC filter;  b. prev on eliquis - noncompliant.   Shortness of breath dyspnea    Sinus headache    Squamous cell skin cancer 07/13/2016   "foot"   Tobacco abuse     Past Surgical History:  Procedure Laterality Date   BACK SURGERY  2022   CARDIAC CATHETERIZATION N/A 01/04/2015   Procedure: Left Heart Cath and Coronary Angiography;  Surgeon: Kathleene Hazel, MD;  Location: [facility] INVASIVE CV LAB;  Service: Cardiovascular;  Laterality: N/A;   CORONARY ANGIOPLASTY WITH STENT PLACEMENT  10/21/2013   BMS to D1   CYST EXCISION N/A 05/10/2016   Procedure: EXCISION OF SEBACEOUS CYST UPPER BACK;  Surgeon: Manus Rudd, MD;  Location: [facility] OR;  Service: General;  Laterality: N/A;   FOOT SURGERY Right ~ 06/2016   "said there was skin cancer on it"   LEFT HEART CATHETERIZATION WITH CORONARY ANGIOGRAM N/A 10/21/2013   Procedure: LEFT HEART CATHETERIZATION WITH CORONARY ANGIOGRAM;  Surgeon: Marykay Lex, MD;  Location[facility] CATH LAB;  Service: Cardiovascular;  Laterality: N/A;   VENA CAVA FILTER PLACEMENT  ~ 2007-~ 2017   "1 in my neck; 2 in my wrist"     Physical Exam: Blood pressure 100/64, pulse 85, temperature 98 F (36.7 C), temperature source Oral, height  (1.88 m), weight 175 lb 12.8 oz (79.7 kg), SpO2 98 %. Gen:      No acute distress Lungs:    No increased respiratory effort, symmetric chest wall excursion, clear to auscultation bilaterally, no wheezes or crackles CV:         Regular rate and rhythm; no murmurs, rubs, or gallops.  No pedal edema Abd:      + bowel sounds; soft, non-tender; no distension MSK: no acute synovitis of DIP or PIP joints, no mechanics hands.  Skin:      Warm and dry; no rashes Neuro: normal speech, no focal facial asymmetry, normal muscle bulk and tone in upper and lower extremities and strength is 5/5.  Psych: alert and oriented x3,  normal mood and affect   Data Reviewed/Medical Decision Making:  Independent interpretation of tests: Imaging:  Review of patient's ct chest ldct lung cancer screening October 2022 images revealed centrilobular emphysema, no suspicious nodules or masses. Marland Kitchen  The patient's images have been independently reviewed by me.    PFTs: I have personally reviewed the patient's PFTs and normal pulmonary function in 2018    Latest Ref Rng & Units 07/14/2016   11:29 AM  PFT Results  FVC-Pre L 3.63   FVC-Predicted Pre % 79   FVC-Post L 3.75   FVC-Predicted Post % 82   Pre FEV1/FVC % % 75   Post FEV1/FCV % % 76   FEV1-Pre L 2.74   FEV1-Predicted Pre % 76   FEV1-Post L 2.86   DLCO uncorrected ml/min/mmHg 22.31   DLCO UNC% % 61   DLVA Predicted % 88   TLC L 6.07   TLC % Predicted % 79   RV % Predicted % 109     Labs:  Lab Results  Component Value Date   NA 137 06/07/2022   K CANCELED 06/07/2022   CO2 16 (L) 06/07/2022   GLUCOSE 75 06/07/2022   BUN 16 06/07/2022   CREATININE 0.66 (L) 06/07/2022   CALCIUM 9.5 06/07/2022   EGFR 106 06/07/2022   GFRNONAA >60 11/02/2020   Lab Results  Component Value Date   WBC 4.6 09/06/2021   HGB 13.9 09/06/2021   HCT 42.5 09/06/2021   MCV 99.1 09/06/2021   PLT 164.0 09/06/2021     Immunization status:  Immunization History  Administered Date(s) Administered   Hepatitis B, ADULT 02/24/2015, 04/08/2015   Influenza Split 02/11/2012   Influenza,inj,Quad PF,6+ Mos 02/22/2013, 01/27/2014, 12/02/2014, 02/03/2016, 12/21/2016, 03/06/2018, 01/21/2019, 01/01/2020, 12/01/2020, 12/07/2021   PFIZER(Purple Top)SARS-COV-2 Vaccination 06/28/2019, 07/19/2019   Pneumococcal Polysaccharide-23 03/24/2012, 01/02/2015   Tdap 04/21/2019   Zoster Recombinat (Shingrix) 04/21/2019, 01/01/2020     I reviewed prior external note(s) from cardiology  I reviewed the result(s) of the labs and imaging as noted above.   I have ordered PFTs  Discussion of management or  test interpretation with another colleague.   Assessment:  Shortness of breath, fatigue COPD, Gold Stage 0 from 2018 PFTs.  Tobacco use disorder, not ready to quit smoking CAD with ICM LVEF 40%  Plan/Recommendations:  Will obtain repeat PFTs since it has been 6 years.   ABIs were performed in 2021 which were normal. Lower extremity arterial studies without significant blockage.  His strength is bilateral and symmetric with normal muscle bulk and tone. Not sure what is causing his leg fatigue after ruling out claudication and neuromuscular weakness. Consider deconditioning. He says he was supposed to do physical therapy but stopped going. Does not appear to have sleep apnea based on symptoms.   For now:  Start taking trelegy inhaler 1 puff once daily - take this everyday even if you feel ok.  Start taking albuterol before exertion such as walking to get groceries. This might enable you to walk further without getting tired.   We discussed disease management and progression at length today. He is not yet ready to quit smoking.    Smoking Cessation Counseling:  1. The patient is an everyday smoker and symptomatic due to the following condition copd 2. The patient is currently pre-contemplative in quitting smoking. 3. I advised patient to quit smoking. 4. We identified patient specific barriers to change.  5. I personally spent 3 minutes counseling the patient regarding tobacco use disorder. 6. We discussed management of stress and anxiety to help with smoking cessation, when applicable. 7. We discussed nicotine replacement therapy, Wellbutrin, Chantix as possible options. 8. I advised setting a quit date. 9. Follow?up arranged with our office  to continue ongoing discussions. 10.Resources given to patient including quit hotline.    Return to Care: Return in about 2 months (around 09/10/2022) for PFT before next visit, same day. Durel Salts, MD Pulmonary and Critical Care  [facility] [phone]  CC: Parke Poisson, MD

## 2022-07-11 NOTE — Patient Instructions (Addendum)
Please schedule follow up scheduled with myself in 2 months.  If my schedule is not open yet, we will contact you with a reminder closer to that time. Please call [phone] if you haven't heard from [place] a month before.   Before your next visit I would like you to have:  Full set of PFTs - 45 minutes, come early   Start taking trelegy inhaler 1 puff once daily - take this everyday even if you feel ok.  Start taking albuterol before exertion such as walking to get groceries. This might enable you to walk further without getting tired.

## 2022-07-11 NOTE — Telephone Encounter (Signed)
  Chief Complaint: eyelids swollen  Symptoms: upper eyelids swollen, some itching.  Frequency: 3-4 months or longer Pertinent Negatives: NA Disposition: ED /[] Urgent Care (no appt availability in office) / Appointment(In office/virtual)/  [facility]/ Home Care/ Refused Recommended Disposition /[] [facility]/  Follow-up with PCP Additional Notes: pt calling back because he was triaged on 07/04/22 and was sent message to practice regarding pt requesting prednisone but states he never got a call back. Pt has had this issue on and off for several years. Someone told him today that he looked like he had gotten into a fight d/t the swelling. Scheduled VV tomorrow at 1410 with Zelda, NP d/t pt unable to come in for OV d/t transportation needs.        Reason for Disposition  [1] MILD eyelid swelling (puffiness) AND [2] persists > 3 days  (Exception: Suspect mosquito bites.)  Answer Assessment - Initial Assessment Questions 1. ONSET: "When did the swelling start?" (e.g., minutes, hours, days) 3 -4 months  2. LOCATION: "What part of the eyelids is swollen?" Upper lids  3. SEVERITY: "How swollen is it?" Can see, looks like been a fight  4. ITCHING: "Is there any itching?" If Yes, ask: "How much?" (Scale 1-10; mild, moderate or severe) sometimes 5. PAIN: "Is the swelling painful to touch?" If Yes, ask: "How painful is it?" (Scale 1-10; mild, moderate or severe) No  8. RECURRENT SYMPTOM: "Have you had eyelid swelling before?" If Yes, ask: "When was the last time?" "What happened that time?" Yes took prednisone and helped for a few years  9. OTHER SYMPTOMS: "Do you have any other symptoms?" (e.g., blurred vision, eye discharge, rash, runny nose) No drainage, no blurred vision no runny nose  Protocols used: Eye - Swelling-A-AH

## 2022-07-12 ENCOUNTER — Telehealth: Payer: 59 | Admitting: Nurse Practitioner

## 2022-07-13 ENCOUNTER — Ambulatory Visit: Payer: Self-pay | Admitting: *Deleted

## 2022-07-13 NOTE — Telephone Encounter (Signed)
Summary: Right eye swollen   Pt is calling to report that his right eye is swollen no pain associated with the eye. However, pain reports that he had a MyChart appt this week. 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 And there are no available in office appts available. Please advise     Attempted My Chart VV from 07/11/22 triage call and unable to do VV.    Chief Complaint: right eyelid swelling   Symptoms: right eyelid upper eyelid swelling almost closed but can see out of eye. Mild itching redness. No pain see NT encounter 07/11/22. Frequency: 2 months  Pertinent Negatives: Patient denies fever, can see out of eye no swollen shut. No drainage Disposition: ED /[] Urgent Care (no appt availability in office) / Appointment(In office/virtual)/  [facility]/ Home Care/ Refused Recommended Disposition /[] [facility]/  Follow-up with PCP Additional Notes:   Recommended mobile bus and address given but patient reports no transportation. Did not know how to do My Chart VV appt scheduled during triage call 07/11/22. No available appt until May 2 and patient reports he would like prednisone given due to it has worked in the past for same issues. Please advise.       Reason for Disposition  [1] MILD eyelid swelling (puffiness) AND [2] persists > 3 days  (Exception: Suspect mosquito bites.)  Answer Assessment - Initial Assessment Questions 1. ONSET: "When did the swelling start?" (e.g., minutes, hours, days)     2 months ago  2. LOCATION: "What part of the eyelids is swollen?"     Upper eyelids 3. SEVERITY: "How swollen is it?"     Right eye swelling eye almost closed but can see out of eye  4. ITCHING: "Is there any itching?" If Yes, ask: "How much?"   (Scale 1-10; mild, moderate or severe)     Mild  5. PAIN: "Is the swelling painful to touch?" If Yes, ask: "How painful is it?"   (Scale 1-10; mild, moderate or severe)     no 6. FEVER: "Do you have a fever?" If  Yes, ask: "What is it, how was it measured, and when did it start?"      na 7. CAUSE: "What do you think is causing the swelling?"     Not sure  8. RECURRENT SYMPTOM: "Have you had eyelid swelling before?" If Yes, ask: "When was the last time?" "What happened that time?"     Yes took prednisone and got better  9. OTHER SYMPTOMS: "Do you have any other symptoms?" (e.g., blurred vision, eye discharge, rash, runny nose)     Can't open eye wide can see, mild itching, took prednisone in the past and helped  10. PREGNANCY: "Is there any chance you are pregnant?" "When was your last menstrual period?"       na  Protocols used: Eye - Swelling-A-AH

## 2022-07-14 NOTE — Telephone Encounter (Signed)
He needs an Urgent  Care visit. I am unable to make a diagnosis based on his message and I am unable to just prescribe Prednisone.

## 2022-07-14 NOTE — Telephone Encounter (Signed)
Patient advise to got to UC to be evaluated.patient voice he think that all he need is prednisone, Stressed to patient the his PCP is advising that he be seen at [facility]. Patient said ok and call ended.

## 2022-07-24 ENCOUNTER — Other Ambulatory Visit: Payer: Self-pay

## 2022-08-02 ENCOUNTER — Other Ambulatory Visit: Payer: Self-pay

## 2022-08-02 ENCOUNTER — Other Ambulatory Visit (HOSPITAL_COMMUNITY): Payer: Self-pay

## 2022-08-24 ENCOUNTER — Ambulatory Visit: Payer: 59 | Admitting: Podiatry

## 2022-09-12 ENCOUNTER — Ambulatory Visit: Payer: 59 | Admitting: Family Medicine

## 2022-09-13 ENCOUNTER — Ambulatory Visit: Payer: 59 | Admitting: Family Medicine

## 2022-09-21 ENCOUNTER — Ambulatory Visit (INDEPENDENT_AMBULATORY_CARE_PROVIDER_SITE_OTHER): Payer: Medicare HMO | Admitting: Podiatry

## 2022-09-21 ENCOUNTER — Other Ambulatory Visit: Payer: Self-pay

## 2022-09-21 ENCOUNTER — Other Ambulatory Visit (HOSPITAL_COMMUNITY): Payer: Self-pay

## 2022-09-21 DIAGNOSIS — M792 Neuralgia and neuritis, unspecified: Secondary | ICD-10-CM

## 2022-09-21 MED ORDER — GABAPENTIN 100 MG PO CAPS
100.0000 mg | ORAL_CAPSULE | Freq: Every day | ORAL | 0 refills | Status: DC
  Filled 2022-09-21 (×2): qty 90, 90d supply, fill #0

## 2022-09-21 NOTE — Patient Instructions (Signed)
Start gabapentin 1 tablet at night (100mg ). After a week you can go up to 2 tablets at night (200mg ) if needed. After that you can go up to taking 300mg  (3 tablets) at night as long as you don't have any side affect.   Gabapentin Capsules or Tablets What is this medication? GABAPENTIN (GA ba pen tin) treats nerve pain. It may also be used to prevent and control seizures in people with epilepsy. It works by calming overactive nerves in your body. This medicine may be used for other purposes; ask your health care provider or pharmacist if you have questions. COMMON BRAND NAME(S): Active-PAC with Gabapentin, Ascencion Dike, Gralise, Neurontin What should I tell my care team before I take this medication? They need to know if you have any of these conditions: Kidney disease Lung or breathing disease Substance use disorder Suicidal thoughts, plans, or attempt by you or a family member An unusual or allergic reaction to gabapentin, other medications, foods, dyes, or preservatives Pregnant or trying to get pregnant Breastfeeding How should I use this medication? Take this medication by mouth with a glass of water. Follow the directions on the prescription label. You can take it with or without food. If it upsets your stomach, take it with food. Take your medication at regular intervals. Do not take it more often than directed. Do not stop taking except on your care team's advice. If you are directed to break the 600 or 800 mg tablets in half as part of your dose, the extra half tablet should be used for the next dose. If you have not used the extra half tablet within 28 days, it should be thrown away. A special MedGuide will be given to you by the pharmacist with each prescription and refill. Be sure to read this information carefully each time. Talk to your care team about the use of this medication in children. While this medication may be prescribed for children as young as 3 years for selected conditions,  precautions do apply. Overdosage: If you think you have taken too much of this medicine contact a poison control center or emergency room at once. NOTE: This medicine is only for you. Do not share this medicine with others. What if I miss a dose? If you miss a dose, take it as soon as you can. If it is almost time for your next dose, take only that dose. Do not take double or extra doses. What may interact with this medication? Alcohol Antihistamines for allergy, cough, and cold Certain medications for anxiety or sleep Certain medications for depression like amitriptyline, fluoxetine, sertraline Certain medications for seizures like phenobarbital, primidone Certain medications for stomach problems General anesthetics like halothane, isoflurane, methoxyflurane, propofol Local anesthetics like lidocaine, pramoxine, tetracaine Medications that relax muscles for surgery Opioid medications for pain Phenothiazines like chlorpromazine, mesoridazine, prochlorperazine, thioridazine This list may not describe all possible interactions. Give your health care provider a list of all the medicines, herbs, non-prescription drugs, or dietary supplements you use. Also tell them if you smoke, drink alcohol, or use illegal drugs. Some items may interact with your medicine. What should I watch for while using this medication? Visit your care team for regular checks on your progress. You may want to keep a record at home of how you feel your condition is responding to treatment. You may want to share this information with your care team at each visit. You should contact your care team if your seizures get worse or if you have  any new types of seizures. Do not stop taking this medication or any of your seizure medications unless instructed by your care team. Stopping your medication suddenly can increase your seizures or their severity. This medication may cause serious skin reactions. They can happen weeks to months  after starting the medication. Contact your care team right away if you notice fevers or flu-like symptoms with a rash. The rash may be red or purple and then turn into blisters or peeling of the skin. Or, you might notice a red rash with swelling of the face, lips or lymph nodes in your neck or under your arms. Wear a medical identification bracelet or chain if you are taking this medication for seizures. Carry a card that lists all your medications. This medication may affect your coordination, reaction time, or judgment. Do not drive or operate machinery until you know how this medication affects you. Sit up or stand slowly to reduce the risk of dizzy or fainting spells. Drinking alcohol with this medication can increase the risk of these side effects. Your mouth may get dry. Chewing sugarless gum or sucking hard candy, and drinking plenty of water may help. Watch for new or worsening thoughts of suicide or depression. This includes sudden changes in mood, behaviors, or thoughts. These changes can happen at any time but are more common in the beginning of treatment or after a change in dose. Call your care team right away if you experience these thoughts or worsening depression. If you become pregnant while using this medication, you may enroll in the [place] American Antiepileptic Drug Pregnancy Registry by calling [phone]. This registry collects information about the safety of antiepileptic medication use during pregnancy. What side effects may I notice from receiving this medication? Side effects that you should report to your care team as soon as possible: Allergic reactions or angioedema--skin rash, itching, hives, swelling of the face, eyes, lips, tongue, arms, or legs, trouble swallowing or breathing Rash, fever, and swollen lymph nodes Thoughts of suicide or self harm, worsening mood, feelings of depression Trouble breathing Unusual changes in mood or behavior in children after use such  as difficulty concentrating, hostility, or restlessness Side effects that usually do not require medical attention (report to your care team if they continue or are bothersome): Dizziness Drowsiness Nausea Swelling of ankles, feet, or hands Vomiting This list may not describe all possible side effects. Call your doctor for medical advice about side effects. You may report side effects to FDA at 1-800-FDA-1088. Where should I keep my medication? Keep out of reach of children and pets. Store at room temperature between 15 and 30 degrees C (59 and 86 degrees F). Get rid of any unused medication after the expiration date. This medication may cause accidental overdose and death if taken by other adults, children, or pets. To get rid of medications that are no longer needed or have expired: Take the medication to a medication take-back program. Check with your pharmacy or law enforcement to find a location. If you cannot return the medication, check the label or package insert to see if the medication should be thrown out in the garbage or flushed down the toilet. If you are not sure, ask your care team. If it is safe to put it in the trash, empty the medication out of the container. Mix the medication with cat litter, dirt, coffee grounds, or other unwanted substance. Seal the mixture in a bag or container. Put it in the trash. NOTE: This  sheet is a summary. It may not cover all possible information. If you have questions about this medicine, talk to your doctor, pharmacist, or health care provider.  2024 Elsevier/Gold Standard (2021-12-27 00:00:00)

## 2022-09-21 NOTE — Progress Notes (Signed)
Subjective: Chief Complaint  Patient presents with   Foot Pain    C/o numbness to right hallux. Numbness and pain. Patient had surgery in 2018 for right silver bunionectomy and soft tissue mass excision. Patient does not take any medication for nerve pain.    62 year old male presents the office today with above concerns.  He states that ever since he had surgery in 2018 he is still having some numbness and some tingling along the incision site he has numbness on the medial aspect of great toe he points to.  It has not worsened but is not improved either.  Objective: AAO x3, NAD DP/PT pulses palpable bilaterally, CRT less than 3 seconds Patient well coapted eschar is well-formed.  Is mildly hypertrophic but is quite soft does not significantly thickened.  There is negative Tinel sign along this area.  There is no edema or erythema.  No skin lesions or soft tissue masses noted.  He describes numbness along the medial aspect of the hallux and some tingling. No pain with calf compression, swelling, warmth, erythema  Assessment: Neuritis  Plan: -All treatment options discussed with the patient including all alternatives, risks, complications.  -We discussed different treatment options.  Discussed trying a steroid injection but we are on this today.  Start gabapentin discussed side effects.  Discussed we can titrate up the dose if needed. -Patient encouraged to call the office with any questions, concerns, change in symptoms.   Vivi Barrack DPM

## 2022-09-22 ENCOUNTER — Other Ambulatory Visit: Payer: Self-pay

## 2022-09-22 ENCOUNTER — Other Ambulatory Visit (HOSPITAL_COMMUNITY): Payer: Self-pay

## 2022-10-03 DIAGNOSIS — J3489 Other specified disorders of nose and nasal sinuses: Secondary | ICD-10-CM | POA: Diagnosis not present

## 2022-10-03 DIAGNOSIS — H5789 Other specified disorders of eye and adnexa: Secondary | ICD-10-CM | POA: Diagnosis not present

## 2022-10-05 ENCOUNTER — Encounter: Payer: Self-pay | Admitting: Physician Assistant

## 2022-10-05 ENCOUNTER — Other Ambulatory Visit: Payer: Self-pay

## 2022-10-05 ENCOUNTER — Other Ambulatory Visit (HOSPITAL_COMMUNITY): Payer: Self-pay

## 2022-10-05 ENCOUNTER — Ambulatory Visit: Payer: Medicare HMO | Attending: Family Medicine | Admitting: Physician Assistant

## 2022-10-05 VITALS — BP 147/91 | HR 64 | Wt 175.8 lb

## 2022-10-05 DIAGNOSIS — F17200 Nicotine dependence, unspecified, uncomplicated: Secondary | ICD-10-CM

## 2022-10-05 DIAGNOSIS — D490 Neoplasm of unspecified behavior of digestive system: Secondary | ICD-10-CM | POA: Diagnosis not present

## 2022-10-05 DIAGNOSIS — H5789 Other specified disorders of eye and adnexa: Secondary | ICD-10-CM | POA: Diagnosis not present

## 2022-10-05 MED ORDER — GABAPENTIN 100 MG PO CAPS
100.0000 mg | ORAL_CAPSULE | Freq: Every day | ORAL | 0 refills | Status: DC
  Filled 2022-10-05: qty 90, 90d supply, fill #0

## 2022-10-05 NOTE — Patient Instructions (Addendum)
If you have not heard from your ears, nose, throat doctor by Monday, call to schedule a follow-up appt with them and to find out about the eye doctor appt

## 2022-10-05 NOTE — Progress Notes (Signed)
Patient ID: [name], male   DOB: 07-Nov-1960, 62 y.o.   MRN: [id number]   [name], is a 62 y.o. male  AOZ:[id number]  [id number]  DOB - 08-16-1960  Chief Complaint  Patient presents with   Facial Swelling    Patient stated that when both eye were swollen the last time he was given prednisone   Mass    On left side of face no pain    Medication Refill       Subjective:   [name] is a 62 y.o. male here today for R upper lid swelling (2 weeks) and L parotid gland swelling.  However, he has already seen ENT for this 2 days ago.  They are having him follow up with ophthalmology.  A CT was done to R/O orbital cellulitis.  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  The eye has swollen before about 5 months ago and prednisone helped.  The lump in front of his L ear, he noticed about 6 months ago.    He denies any difficulty swallowing.  No dysphagia.  Appetite is good.  He denies fevers or night sweats.  He is a lifetime smoker and is down to 5 cigs daily  No problems updated.  ALLERGIES: Allergies  Allergen Reactions   Atorvastatin     myalgia    PAST MEDICAL HISTORY: Past Medical History:  Diagnosis Date   Asthma    CAD (coronary artery disease)    a. 09/2013 NSTEMI/PCI: LM nl, LAD 40-50%, D1 100 (2.25 x 28 Vision BMS), LCX min irregs, RI 60-70, 30, RCA 40-50/50-43ms/p, EF 45-50%.  b. cath 12/2014 -occulded BMS in diag, 50% pro to mid LAD, 60% ramus, 30% RCA    Chest pain 08/2015   Chronic diastolic CHF (congestive heart failure) (HCC)    a. 09/2014 EF 45-50% by LV gram;  b. 01/2014 Echo: EF 55-60%, Gr 1 DD.   Cocaine abuse (HCC)    COPD (chronic obstructive pulmonary disease) (HCC)    DVT (deep venous thrombosis) (HCC)    "I had ~ 10 in each leg" (12/20/2016)   Essential hypertension    Hepatitis C    "clear free over a year now"   High cholesterol    Myocardial infarction [facility]) ~ 2014   Osteoarthritis    a.  hands and toes.   Pneumonia    "several times" (12/20/2016)   Pulmonary embolism (HCC)    a. 2012 - s/p IVC filter;  b. prev on eliquis - noncompliant.   Shortness of breath dyspnea    Sinus headache    Squamous cell skin cancer 07/13/2016   "foot"   Tobacco abuse     MEDICATIONS AT HOME: Prior to Admission medications   Medication Sig Start Date End Date Taking? Authorizing Provider  albuterol (PROAIR HFA) 108 (90 Base) MCG/ACT inhaler Inhale 2 puffs into the lungs every 6 (six) hours as needed for wheezing or shortness of breath. 06/07/22  Yes Hoy Register, MD  albuterol (PROVENTIL) (2.5 MG/3ML) 0.083% nebulizer solution Take 3 mLs (2.5 mg total) by nebulization every 6 (six) hours as needed for wheezing or shortness of breath. 04/21/19  Yes Newlin, Enobong, MD  apixaban (ELIQUIS) 5 MG TABS tablet TAKE 1 TABLET (5 MG TOTAL) BY MOUTH 2 (TWO) TIMES DAILY. 06/14/22  Yes Parke Poisson, MD  Azelastine-Fluticasone 137-50 MCG/ACT SUSP Place 2 sprays into the nose every 12 (twelve) hours. 06/07/22  Yes Verlee Monte, MD  cetirizine (ZYRTEC) 10 MG tablet Take 1 tablet (10 mg total) by mouth daily. 06/07/22  Yes Verlee Monte, MD  empagliflozin (JARDIANCE) 10 MG TABS tablet Take 1 tablet (10 mg total) by mouth daily before breakfast. 06/14/22  Yes Parke Poisson, MD  Evolocumab 140 MG/ML SOAJ INJECT 140 MG INTO THE SKIN EVERY 14 (FOURTEEN) DAYS. 06/14/22  Yes Parke Poisson, MD  ezetimibe (ZETIA) 10 MG tablet TAKE 1 TABLET (10 MG TOTAL) BY MOUTH DAILY. 06/14/22 06/14/23 Yes Parke Poisson, MD  Fluticasone-Umeclidin-Vilant (TRELEGY ELLIPTA) 100-62.5-25 MCG/ACT AEPB Inhale 1 puff into the lungs daily. Patient taking differently: Inhale 1 puff into the lungs daily as needed. 06/07/22  Yes Hoy Register, MD  furosemide (LASIX) 40 MG tablet Take 1 tablet (40 mg total) by mouth daily as needed (May take 40mg  Daily for Swelling and Shortness of breath). 06/14/22  Yes Parke Poisson, MD   gabapentin (NEURONTIN) 100 MG capsule Take 1 capsule (100 mg total) by mouth at bedtime. 09/21/22  Yes Vivi Barrack, DPM  isosorbide mononitrate (IMDUR) 30 MG 24 hr tablet Take 1 tablet (30 mg total) by mouth daily. 06/14/22  Yes Parke Poisson, MD  losartan (COZAAR) 25 MG tablet Take 1 tablet (25 mg total) by mouth daily. 06/14/22  Yes Parke Poisson, MD  montelukast (SINGULAIR) 10 MG tablet Take 1 tablet (10 mg total) by mouth at bedtime. 06/07/22  Yes Verlee Monte, MD  pantoprazole (PROTONIX) 40 MG tablet Take 1 tablet (40 mg total) by mouth daily. 06/07/22  Yes Newlin, Odette Horns, MD  Torsemide 40 MG TABS Take 1 tablet by mouth daily.   Yes [provider]  nitroGLYCERIN (NITROSTAT) 0.4 MG SL tablet Place 1 tablet (0.4 mg total) under the tongue every 5 (five) minutes as needed for chest pain. 06/14/22 09/12/22  Parke Poisson, MD    ROS:  Neg resp Neg cardiac Neg GI Neg GU Neg MS Neg psych Neg neuro  Objective:   Vitals:   10/05/22 1022  BP: (!) 147/91  Pulse: 64  SpO2: 98%  Weight: 175 lb 12.8 oz (79.7 kg)   Exam General appearance : Awake, alert, not in any distress. Speech Clear. Not toxic looking HEENT: Atraumatic and Normocephalic. 3cm mobile but semi-firm parotid mass on the L.  Throat WNL.  No oral lesions noted.  R upper lid is swollen but not drooping.  PERRLA and EOM intact. Non-tender.  Lacrimal glad is swollen.  No erythema or induration Neck: Supple, no JVD. No cervical lymphadenopathy. No occipital nodes.   No axillary nodes Chest: Good air entry bilaterally, CTAB.  No rales/rhonchi/wheezing CVS: S1 S2 regular, no murmurs.  Extremities: B/L Lower Ext shows no edema, both legs are warm to touch Neurology: Awake alert, and oriented X 3, CN II-XII intact, Non focal Skin: No Rash  Data Review Lab Results  Component Value Date   HGBA1C 5.8 (H) 12/07/2021   HGBA1C 5.9 (H) 08/09/2020   HGBA1C 5.4 05/16/2016    Assessment & Plan   1.  Parotid tumor ENT planning to remove(see note 10/03/2022) - Comprehensive metabolic panel - TSH - CBC with Differential/Platelet  2. Eye swelling ENT referred to eye doctor.  No red flags.  See ENT visit 10/03/2022 - Comprehensive metabolic panel - TSH - CBC with Differential/Platelet  3. Smoking Smoking and dangers of nicotine have been discussed at length. Long term health consequences of smoking reviewed in detail.  Methods  for helping with cessation have been reviewed.  Patient expresses understanding.     Return in about 3 months (around 01/05/2023) for PCP(Newlin) for chronic conditions.  The patient was given clear instructions to go to ER or return to medical center if symptoms don't improve, worsen or new problems develop. The patient verbalized understanding. The patient was told to call to get lab results if they haven't heard anything in the next week.      Georgian Co, PA-C [facility] [place], [place] [phone]   10/05/2022, 10:59 AM

## 2022-10-06 ENCOUNTER — Telehealth: Payer: Self-pay

## 2022-10-06 LAB — CBC WITH DIFFERENTIAL/PLATELET
Basophils Absolute: 0 10*3/uL (ref 0.0–0.2)
Basos: 1 %
EOS (ABSOLUTE): 0.5 10*3/uL — ABNORMAL HIGH (ref 0.0–0.4)
Eos: 10 %
Hematocrit: 42.2 % (ref 37.5–51.0)
Hemoglobin: 14.5 g/dL (ref 13.0–17.7)
Immature Grans (Abs): 0 10*3/uL (ref 0.0–0.1)
Immature Granulocytes: 0 %
Lymphocytes Absolute: 1.8 10*3/uL (ref 0.7–3.1)
Lymphs: 36 %
MCH: 31.9 pg (ref 26.6–33.0)
MCHC: 34.4 g/dL (ref 31.5–35.7)
MCV: 93 fL (ref 79–97)
Monocytes Absolute: 0.6 10*3/uL (ref 0.1–0.9)
Monocytes: 11 %
Neutrophils Absolute: 2 10*3/uL (ref 1.4–7.0)
Neutrophils: 42 %
Platelets: 169 10*3/uL (ref 150–450)
RBC: 4.54 x10E6/uL (ref 4.14–5.80)
RDW: 13.1 % (ref 11.6–15.4)
WBC: 5 10*3/uL (ref 3.4–10.8)

## 2022-10-06 LAB — TSH: TSH: 2.22 u[IU]/mL (ref 0.450–4.500)

## 2022-10-06 LAB — COMPREHENSIVE METABOLIC PANEL
ALT: 19 IU/L (ref 0–44)
AST: 24 IU/L (ref 0–40)
Albumin: 4.2 g/dL (ref 3.9–4.9)
Alkaline Phosphatase: 116 IU/L (ref 44–121)
BUN/Creatinine Ratio: 17 (ref 10–24)
BUN: 12 mg/dL (ref 8–27)
Bilirubin Total: 0.3 mg/dL (ref 0.0–1.2)
CO2: 22 mmol/L (ref 20–29)
Calcium: 9.9 mg/dL (ref 8.6–10.2)
Chloride: 103 mmol/L (ref 96–106)
Creatinine, Ser: 0.72 mg/dL — ABNORMAL LOW (ref 0.76–1.27)
Globulin, Total: 3.8 g/dL (ref 1.5–4.5)
Glucose: 90 mg/dL (ref 70–99)
Potassium: 4.8 mmol/L (ref 3.5–5.2)
Sodium: 139 mmol/L (ref 134–144)
Total Protein: 8 g/dL (ref 6.0–8.5)
eGFR: 103 mL/min/{1.73_m2} (ref >59)

## 2022-10-06 NOTE — Telephone Encounter (Signed)
Pt was called and vm was left, Information has been sent to nurse pool.   Also for his REPATHA he can call his cardiologist and request they do a prior authorization because he does qualify for this medication

## 2022-10-06 NOTE — Telephone Encounter (Signed)
-----   Message from Georgian Co sent at 10/06/2022 12:59 PM EDT ----- Thyroid is normal.  Kidney function shows mild dehydration.  Increase you water intake.  Liver, electrolytes, blood count.  Thanks, Georgian Co, PA-C

## 2022-10-27 ENCOUNTER — Telehealth: Payer: Self-pay

## 2022-10-27 NOTE — Telephone Encounter (Signed)
Copied from CRM [id number]. Topic: General - Other >> Oct 27, 2022  9:34 AM Epimenio Foot F wrote: Reason for CRM: Pt is calling in because at his last appointment he had his right eye looked at and per pt he was told to go to [facility]. Pt says when he contacted [facility] he was told they were trying to see if his insurance was going to cover the procedure. Pt said it's been a month and he hasn't heard anything back and wanted to know if his doctor could reach out and see what was going on. Pt is also requesting someone to call him back because he says he still needs to have this taken care of.

## 2022-10-30 ENCOUNTER — Other Ambulatory Visit: Payer: Self-pay

## 2022-10-30 ENCOUNTER — Ambulatory Visit: Payer: Self-pay

## 2022-10-30 ENCOUNTER — Telehealth: Payer: Self-pay | Admitting: Family Medicine

## 2022-10-30 ENCOUNTER — Other Ambulatory Visit (HOSPITAL_COMMUNITY): Payer: Self-pay

## 2022-10-30 NOTE — Telephone Encounter (Signed)
  Patient is calling to report that his eye is still swelling. Pt was waiting on call from ENT. Per the pt, that office would need to reverify his insurance. Please advise     Left message to call back.

## 2022-10-30 NOTE — Telephone Encounter (Addendum)
Spoke with patient . Verified name & DOB  Patient voiced that he has not received a call from ophthalmology . Advised patient to call ENT office as they placed the referral for him and the ENT office should be able to give him the contact information for ophthalmology .

## 2022-10-30 NOTE — Telephone Encounter (Signed)
      Chief Complaint: Right still swollen. Has seen ENT and eye doctor. Wants advise from PCP. Symptoms: Above Frequency: 2 months ago Pertinent Negatives: Patient denies  Disposition: [] ED /[] Urgent Care (no appt availability in office) / [] Appointment(In office/virtual)/ []  [place] [facility]/ [] Home Care/ [] Refused Recommended Disposition /[] [place] [facility]/ [x]  Follow-up with PCP Additional Notes: Please advise pt.  Reason for Disposition  MODERATE-SEVERE eyelid swelling on one side  (Exception: Due to a mosquito bite.)  Answer Assessment - Initial Assessment Questions 1. ONSET: "When did the swelling start?" (e.g., minutes, hours, days)     2 months 2. LOCATION: "What part of the eyelids is swollen?"     Right eye 3. SEVERITY: "How swollen is it?"     Moderate 4. ITCHING: "Is there any itching?" If Yes, ask: "How much?"   (Scale 1-10; mild, moderate or severe)     No 5. PAIN: "Is the swelling painful to touch?" If Yes, ask: "How painful is it?"   (Scale 1-10; mild, moderate or severe)     No 6. FEVER: "Do you have a fever?" If Yes, ask: "What is it, how was it measured, and when did it start?"      No 7. CAUSE: "What do you think is causing the swelling?"     Unsure 8. RECURRENT SYMPTOM: "Have you had eyelid swelling before?" If Yes, ask: "When was the last time?" "What happened that time?"     No 9. OTHER SYMPTOMS: "Do you have any other symptoms?" (e.g., blurred vision, eye discharge, rash, runny nose)     Right eye 10. PREGNANCY: "Is there any chance you are pregnant?" "When was your last menstrual period?"       N/a  Protocols used: Eye - Swelling-A-AH

## 2022-10-31 ENCOUNTER — Other Ambulatory Visit: Payer: Self-pay

## 2022-10-31 ENCOUNTER — Other Ambulatory Visit: Payer: Self-pay | Admitting: Internal Medicine

## 2022-10-31 ENCOUNTER — Other Ambulatory Visit (HOSPITAL_COMMUNITY): Payer: Self-pay

## 2022-10-31 MED ORDER — AZELASTINE HCL 0.1 % NA SOLN
2.0000 | Freq: Two times a day (BID) | NASAL | 5 refills | Status: DC
  Filled 2022-10-31: qty 30, 50d supply, fill #0

## 2022-10-31 MED ORDER — FLUTICASONE PROPIONATE 50 MCG/ACT NA SUSP
2.0000 | Freq: Every day | NASAL | 5 refills | Status: DC
  Filled 2022-10-31: qty 16, 30d supply, fill #0
  Filled 2023-03-08: qty 16, 30d supply, fill #1

## 2022-11-01 DIAGNOSIS — Z79899 Other long term (current) drug therapy: Secondary | ICD-10-CM | POA: Diagnosis not present

## 2022-11-03 ENCOUNTER — Other Ambulatory Visit (HOSPITAL_COMMUNITY): Payer: Self-pay

## 2022-11-03 ENCOUNTER — Telehealth (HOSPITAL_COMMUNITY): Payer: Self-pay | Admitting: Pharmacy Technician

## 2022-11-03 ENCOUNTER — Encounter: Payer: Self-pay | Admitting: Podiatry

## 2022-11-03 ENCOUNTER — Ambulatory Visit (INDEPENDENT_AMBULATORY_CARE_PROVIDER_SITE_OTHER): Payer: Medicare HMO | Admitting: Podiatry

## 2022-11-03 DIAGNOSIS — M79675 Pain in left toe(s): Secondary | ICD-10-CM | POA: Diagnosis not present

## 2022-11-03 DIAGNOSIS — B351 Tinea unguium: Secondary | ICD-10-CM

## 2022-11-03 DIAGNOSIS — M79674 Pain in right toe(s): Secondary | ICD-10-CM | POA: Diagnosis not present

## 2022-11-03 DIAGNOSIS — Z7901 Long term (current) use of anticoagulants: Secondary | ICD-10-CM | POA: Diagnosis not present

## 2022-11-03 NOTE — Progress Notes (Signed)
This patient returns to my office for at risk foot care.  This patient requires this care by a professional since this patient will be at risk due to having coagulation defect. Patient is taking eliquis.  This patient is unable to cut nails himself since the patient cannot reach his nails.These nails are painful walking and wearing shoes.  This patient presents for at risk foot care today.  General Appearance  Alert, conversant and in no acute stress.  Vascular  Dorsalis pedis and posterior tibial  pulses are palpable  bilaterally.  Capillary return is within normal limits  bilaterally. Temperature is within normal limits  bilaterally.  Neurologic  Senn-Weinstein monofilament wire test within normal limits  bilaterally. Muscle power within normal limits bilaterally.  Nails Thick disfigured discolored nails with subungual debris  from hallux to fifth toes bilaterally. No evidence of bacterial infection or drainage bilaterally.  Orthopedic  No limitations of motion  feet .  No crepitus or effusions noted.  No bony pathology or digital deformities noted.  Skin  normotropic skin with no porokeratosis noted bilaterally.  No signs of infections or ulcers noted.     Onychomycosis  Pain in right toes  Pain in left toes  Consent was obtained for treatment procedures.   Mechanical debridement of nails 1-5  bilaterally performed with a nail nipper.  Filed with dremel without incident.    Return office visit    3    months                  Told patient to return for periodic foot care and evaluation due to potential at risk complications.   Gardiner Barefoot DPM

## 2022-11-03 NOTE — Telephone Encounter (Signed)
Pharmacy Patient Advocate Encounter  Received notification from [facility] that Prior Authorization for {Repatha SureClick 140MG /ML auto-injectors has been APPROVED from 03/27/2022 to 03/27/2023. Ran test claim, Copay is $0.00. This test claim was processed through [facility]- copay amounts may vary at other pharmacies due to pharmacy/plan contracts, or as the patient moves through the different stages of their insurance plan.   PA #/Case ID/Reference #: [id number]

## 2022-11-03 NOTE — Telephone Encounter (Signed)
Pharmacy Patient Advocate Encounter   Received notification from CoverMyMeds that prior authorization for Repatha SureClick 140MG /ML auto-injectors is required/requested.   Insurance verification completed.   The patient is insured through [employer] .   Per test claim: PA required; PA submitted to [facility] via CoverMyMeds Key/confirmation #/EOC [id number] Status is pending

## 2022-11-10 ENCOUNTER — Other Ambulatory Visit: Payer: Self-pay

## 2022-11-10 ENCOUNTER — Other Ambulatory Visit (HOSPITAL_COMMUNITY): Payer: Self-pay

## 2022-11-10 NOTE — Telephone Encounter (Signed)
Called pt and let him know that repatha PA has been approved. He verbalizes understanding and will give the pharmacy a call.

## 2022-11-13 ENCOUNTER — Other Ambulatory Visit: Payer: Self-pay

## 2022-11-13 ENCOUNTER — Other Ambulatory Visit (HOSPITAL_COMMUNITY): Payer: Self-pay

## 2022-11-14 DIAGNOSIS — Z79899 Other long term (current) drug therapy: Secondary | ICD-10-CM | POA: Diagnosis not present

## 2022-11-20 DIAGNOSIS — H25813 Combined forms of age-related cataract, bilateral: Secondary | ICD-10-CM | POA: Diagnosis not present

## 2022-11-20 DIAGNOSIS — H0589 Other disorders of orbit: Secondary | ICD-10-CM | POA: Diagnosis not present

## 2022-11-21 ENCOUNTER — Ambulatory Visit: Payer: Medicare HMO | Admitting: Podiatry

## 2022-11-29 DIAGNOSIS — Z79899 Other long term (current) drug therapy: Secondary | ICD-10-CM | POA: Diagnosis not present

## 2022-12-01 ENCOUNTER — Other Ambulatory Visit (HOSPITAL_COMMUNITY): Payer: Self-pay

## 2022-12-01 ENCOUNTER — Encounter: Payer: Self-pay | Admitting: Pharmacist

## 2022-12-01 ENCOUNTER — Other Ambulatory Visit: Payer: Self-pay

## 2022-12-01 DIAGNOSIS — Z789 Other specified health status: Secondary | ICD-10-CM | POA: Insufficient documentation

## 2022-12-04 ENCOUNTER — Encounter: Payer: Medicare HMO | Admitting: *Deleted

## 2022-12-04 ENCOUNTER — Encounter: Payer: Self-pay | Admitting: Internal Medicine

## 2022-12-04 ENCOUNTER — Ambulatory Visit: Payer: Medicare HMO | Attending: Internal Medicine | Admitting: Internal Medicine

## 2022-12-04 VITALS — BP 114/70 | HR 67 | Ht 74.0 in | Wt 176.8 lb

## 2022-12-04 VITALS — BP 132/74 | HR 64 | Temp 97.3°F | Resp 16 | Ht 74.0 in | Wt 176.8 lb

## 2022-12-04 DIAGNOSIS — R079 Chest pain, unspecified: Secondary | ICD-10-CM | POA: Diagnosis not present

## 2022-12-04 DIAGNOSIS — I10 Essential (primary) hypertension: Secondary | ICD-10-CM

## 2022-12-04 DIAGNOSIS — I110 Hypertensive heart disease with heart failure: Secondary | ICD-10-CM

## 2022-12-04 DIAGNOSIS — E785 Hyperlipidemia, unspecified: Secondary | ICD-10-CM

## 2022-12-04 DIAGNOSIS — Z789 Other specified health status: Secondary | ICD-10-CM

## 2022-12-04 DIAGNOSIS — T466X5D Adverse effect of antihyperlipidemic and antiarteriosclerotic drugs, subsequent encounter: Secondary | ICD-10-CM | POA: Diagnosis not present

## 2022-12-04 DIAGNOSIS — I2510 Atherosclerotic heart disease of native coronary artery without angina pectoris: Secondary | ICD-10-CM

## 2022-12-04 DIAGNOSIS — G720 Drug-induced myopathy: Secondary | ICD-10-CM | POA: Diagnosis not present

## 2022-12-04 DIAGNOSIS — I5042 Chronic combined systolic (congestive) and diastolic (congestive) heart failure: Secondary | ICD-10-CM

## 2022-12-04 DIAGNOSIS — Z006 Encounter for examination for normal comparison and control in clinical research program: Secondary | ICD-10-CM

## 2022-12-04 DIAGNOSIS — Z86711 Personal history of pulmonary embolism: Secondary | ICD-10-CM | POA: Diagnosis not present

## 2022-12-04 NOTE — Patient Instructions (Signed)
Medication Instructions:  NO CHANGES  *If you need a refill on your cardiac medications before your next appointment, please call your pharmacy*  Testing/Procedures: Dr. Jacques Navy has ordered a Lexiscan Myocardial Perfusion Imaging Study.  Please arrive 15 minutes prior to your appointment time for registration and insurance purposes.   The test will take approximately 3 to 4 hours to complete; you may bring reading material.  If someone comes with you to your appointment, they will need to remain in the main lobby due to limited space in the testing area. **If you are pregnant or breastfeeding, please notify the nuclear lab prior to your appointment**   How to prepare for your Myocardial Perfusion Test: Do not eat or drink 3 hours prior to your test, except you may have water. Do not consume products containing caffeine (regular or decaffeinated) 12 hours prior to your test. (ex: coffee, chocolate, sodas, tea). Do wear comfortable clothes (no dresses or overalls) and walking shoes, tennis shoes preferred (No heels or open toe shoes are allowed). Do NOT wear cologne, perfume, aftershave, or lotions (deodorant is allowed). If you use an inhaler, use it the AM of your test and bring it with you.  If you use a nebulizer, use it the AM of your test.  If these instructions are not followed, your test will have to be rescheduled.    Follow-Up: At [facility], you and your health needs are our priority.  As part of our continuing mission to provide you with exceptional heart care, we have created designated Provider Care Teams.  These Care Teams include your primary Cardiologist (physician) and Advanced Practice Providers (APPs -  Physician Assistants and Nurse Practitioners) who all work together to provide you with the care you need, when you need it.  We recommend signing up for the patient portal called "MyChart".  Sign up information is provided on this After Visit Summary.  MyChart  is used to connect with patients for Virtual Visits (Telemedicine).  Patients are able to view lab/test results, encounter notes, upcoming appointments, etc.  Non-urgent messages can be sent to your provider as well.   To learn more about what you can do with MyChart, go to [email or web address].    Your next appointment:    2-4 weeks -- after stress test  Schedule with Marjie Skiff PA or Azalee Course PA

## 2022-12-04 NOTE — Research (Cosign Needed Addendum)
AA HEART  Informed Consent   Subject Name: [name]  Subject met inclusion and exclusion criteria.  The informed consent form, study requirements and expectations were reviewed with the subject and questions and concerns were addressed prior to the signing of the consent form.  The subject verbalized understanding of the trial requirements.  The subject agreed to participate in the AA HEART  trial and signed the informed consent at 11:21 a.m  on 12-04-2022.  The informed consent was obtained prior to performance of any protocol-specific procedures for the subject.  A copy of the signed informed consent was given to the subject and a copy was placed in the subject's medical record.   [place] Nissi Doffing, Research Coordinator    Are there any labs that are clinically significant?  Yes []  OR No[]   Please FORWARD back to me with any changes or follow up!    ACCESSION NO. [id number]                                             Page 1 of 2                                                        INVESTIGATOR: ([id number])                          PROTOCOL   [id number]                     Thomasene Ripple, M.D.                              INVESTIGATOR NO.: [id number]                     c/o [facility]                         SUBJECT NUMBER: [id number]                     [facility]                 SUBJECT INITIALS NOT COLLECTED:                     [address]                          VISIT: D0                     [place], [place] [address]                   SPONSOR REPORT TO:                 COLLECTION TIME:12:40 DATE:04-Dec-2022                     Cruzita Lederer                 DATE RECEIVED  IN LABORATORY: 05-Dec-2022                     c/o Sponsor [employer] access         DATE REPORTED BY LABORATORY: 05-Dec-2022                     [place]                          SEX: M  AGE: 11B                     [id number]                   [place], [place]  [place] [phone]                                                                                        Ref. Ranges               Clinical    Comments                                                                          Significance                                                                            Yes*  No                    HEMATOLOGY&DIFFERENTIAL PANEL                   #1 HGB            14.4         12.5-17.0 g/dL                   #1 HCT            45           37-51 %                   #1 RBC            4.6          4.0-5.8 x106/uL                   #1 MCH            32           26-34 pg                   #  1 MCHC           32           31-38 g/dL                   #1 RDW            15.4    H    12.0-15.0 %                      RBC Morph      No Review Required                     #1 MCV            98           80-100 fL                   #1 WBC            3.28    L    3.80-10.70 x103/uL                   #1 Neutrophil     1.67    L    1.96-7.23 x103/uL                      Lymphocyte     1.08         0.80-3.00 x103/uL                   #1 Monocytes      0.21         0.12-0.92 x103/uL                   #1 Eosinophil     0.31         0.00-0.57 x103/uL                   #1 Basophils      0.02         0.00-0.20 x103/uL                   #1 Neutrophil     50.7         40.5-75.0 %                   #1 Lymphocyte     32.9         15.4-48.5%                   #1 Monocytes      6.3          2.6-10.1 %                   #1 Eosinophil     9.5     H    0.0-6.8 %                   #1 Basophils      0.5          0.0-2.0 %                   #1 Platelets      140          130-394 x103/uL   ANC  ANC            1.67    L    1.96-7.23 x103/uL                    HBA1C                      Fast HbA1c     5.8          <6.5%  RETICULOCYTES                      Retic %        2.5          0.6-2.5 %                      Retic Abs      0.113        0.030-0.130  x106/uL    [id number] LPA COLLECTION D/T                      Untimed D      04-Dec-2022                        Untimed T      12:40                            SM/PLASMA PROTEO & BMS D/T                      Untimed D      04-Dec-2022                        Untimed T      12:40                            SM/WB DNA COLLECTION D/T                      Untimed D      04-Dec-2022                        Untimed T      12:40                            SM/WB PAXGENE RNA COLLECT D/T                      Untimed D      04-Dec-2022                        Untimed T      12:40                            SUBJECT FASTED AT LEAST 9 HRS?                      Pt fast 9h     Yes

## 2022-12-04 NOTE — Progress Notes (Signed)
Cardiology Office Note:    Date:  12/04/2022   ID:  [name], DOB [birth year], MRN [id number]  PCP:  Hoy Register, MD  Cardiologist:  Parke Poisson, MD  Electrophysiologist:  None   Referring MD: Hoy Register, MD   Chief Complaint/Reason for Referral: Follow up CAD and HLD  History of Present Illness:    JARREN MITCHUM is a 62 y.o. male with a history of CAD (s/p BMS to D1 in 2015, noted to be chronically occluded by cath in 12/2014 with scattered 30% RCA stenosis, 20% LCx, and 50% Prox-Mid LAD stenosis at that time), PE/DVT (s/p IVC filter placement in 2012), COPD, chronic diastolic CHF (EF 40-98% by echo in 12/2014), HTN, HLD, chronic Hepatitis C, substance use (Cocaine - UDS from 01/2016 positive), and tobacco use.    Patient continues to endorse lower extremity weakness with activity.  We have performed lower extremity ABIs which were unremarkable for obstructive disease.  He is also previously had lumbar fusion with Dr. Dutch Quint and had signs of radiculopathy in the lumbar spine on EMG and has previously seen neurology.  I do not think there is a cardiac or vascular etiology to his lower extremity weakness but this may represent deconditioning, agree with pulmonary medicine evaluation as well.  Patient notes some dyspnea on exertion which has been chronic, but also endorses some chest tightness.  Recall that he has had a cardiac catheterization in 2016 with nonobstructive CAD, but more recently has been noted to have a mildly reduced ejection fraction.  We have pursued pulmonary evaluation and they recommended PFTs and have started him on Trelegy, he does not feel a significant difference in his symptoms.  With no change in exertional shortness of breath and mildly reduced ejection fraction that has persisted, this warrants ischemic evaluation.  Patient is not a treadmill candidate due to likely inability to complete sufficient exercise for diagnostic stress, therefore we  will perform pharmacologic stress.  If stress test abnormal, would consider repeat cardiac catheterization given a 50% LAD lesion proximal to mid noted on his prior cardiac catheterization.  Note patient symptoms have not changed significantly over the last year, but with other interventions there has been no significant improvement, therefore warranting cardiac evaluation.  Past Medical History:  Diagnosis Date   Asthma    CAD (coronary artery disease)    a. 09/2013 NSTEMI/PCI: LM nl, LAD 40-50%, D1 100 (2.25 x 28 Vision BMS), LCX min irregs, RI 60-70, 30, RCA 40-50/50-86ms/p, EF 45-50%.  b. cath 12/2014 -occulded BMS in diag, 50% pro to mid LAD, 60% ramus, 30% RCA    Chest pain 08/2015   Chronic diastolic CHF (congestive heart failure) (HCC)    a. 09/2014 EF 45-50% by LV gram;  b. 01/2014 Echo: EF 55-60%, Gr 1 DD.   Cocaine abuse (HCC)    COPD (chronic obstructive pulmonary disease) (HCC)    DVT (deep venous thrombosis) (HCC)    "I had ~ 10 in each leg" (12/20/2016)   Essential hypertension    Hepatitis C    "clear free over a year now"   High cholesterol    Myocardial infarction [facility]) ~ 2014   Osteoarthritis    a. hands and toes.   Pneumonia    "several times" (12/20/2016)   Pulmonary embolism (HCC)    a. 2012 - s/p IVC filter;  b. prev on eliquis - noncompliant.   Shortness of breath dyspnea    Sinus headache    Squamous cell  skin cancer 07/13/2016   "foot"   Tobacco abuse     Past Surgical History:  Procedure Laterality Date   BACK SURGERY  2022   CARDIAC CATHETERIZATION N/A 01/04/2015   Procedure: Left Heart Cath and Coronary Angiography;  Surgeon: Kathleene Hazel, MD;  Location: [facility] INVASIVE CV LAB;  Service: Cardiovascular;  Laterality: N/A;   CORONARY ANGIOPLASTY WITH STENT PLACEMENT  10/21/2013   BMS to D1   CYST EXCISION N/A 05/10/2016   Procedure: EXCISION OF SEBACEOUS CYST UPPER BACK;  Surgeon: Manus Rudd, MD;  Location: [facility] OR;  Service: General;  Laterality:  N/A;   FOOT SURGERY Right ~ 06/2016   "said there was skin cancer on it"   LEFT HEART CATHETERIZATION WITH CORONARY ANGIOGRAM N/A 10/21/2013   Procedure: LEFT HEART CATHETERIZATION WITH CORONARY ANGIOGRAM;  Surgeon: Marykay Lex, MD;  Location: [facility] CATH LAB;  Service: Cardiovascular;  Laterality: N/A;   VENA CAVA FILTER PLACEMENT  ~ 2007-~ 2017   "1 in my neck; 2 in my wrist"    Current Medications: Current Meds  Medication Sig   albuterol (PROAIR HFA) 108 (90 Base) MCG/ACT inhaler Inhale 2 puffs into the lungs every 6 (six) hours as needed for wheezing or shortness of breath.   albuterol (PROVENTIL) (2.5 MG/3ML) 0.083% nebulizer solution Take 3 mLs (2.5 mg total) by nebulization every 6 (six) hours as needed for wheezing or shortness of breath.   apixaban (ELIQUIS) 5 MG TABS tablet TAKE 1 TABLET (5 MG TOTAL) BY MOUTH 2 (TWO) TIMES DAILY.   Azelastine-Fluticasone 137-50 MCG/ACT SUSP Place 2 sprays into the nose every 12 (twelve) hours. (Patient taking differently: Place 2 sprays into the nose as needed.)   cetirizine (ZYRTEC) 10 MG tablet Take 1 tablet (10 mg total) by mouth daily.   empagliflozin (JARDIANCE) 10 MG TABS tablet Take 1 tablet (10 mg total) by mouth daily before breakfast.   Evolocumab 140 MG/ML SOAJ INJECT 140 MG INTO THE SKIN EVERY 14 (FOURTEEN) DAYS.   ezetimibe (ZETIA) 10 MG tablet TAKE 1 TABLET (10 MG TOTAL) BY MOUTH DAILY.   fluticasone (FLONASE) 50 MCG/ACT nasal spray Place 2 sprays into both nostrils daily. (Patient taking differently: Place 2 sprays into both nostrils as needed.)   Fluticasone-Umeclidin-Vilant (TRELEGY ELLIPTA) 100-62.5-25 MCG/ACT AEPB Inhale 1 puff into the lungs daily. (Patient taking differently: Inhale 1 puff into the lungs daily as needed.)   furosemide (LASIX) 40 MG tablet Take 1 tablet (40 mg total) by mouth daily as needed (May take 40mg  Daily for Swelling and Shortness of breath).   gabapentin (NEURONTIN) 100 MG capsule Take 1 capsule (100 mg  total) by mouth at bedtime.   isosorbide mononitrate (IMDUR) 30 MG 24 hr tablet Take 1 tablet (30 mg total) by mouth daily.   losartan (COZAAR) 25 MG tablet Take 1 tablet (25 mg total) by mouth daily.   montelukast (SINGULAIR) 10 MG tablet Take 1 tablet (10 mg total) by mouth at bedtime.   pantoprazole (PROTONIX) 40 MG tablet Take 1 tablet (40 mg total) by mouth daily.   Torsemide 40 MG TABS Take 1 tablet by mouth daily.   [DISCONTINUED] azelastine (ASTELIN) 0.1 % nasal spray Place 2 sprays into both nostrils 2 (two) times daily. Use in each nostril as directed    Allergies:   Atorvastatin   Social History   Tobacco Use   Smoking status: Every Day    Current packs/day: 1.00    Average packs/day: 1 pack/day for 35.0 years (35.0  ttl pk-yrs)    Types: Cigarettes    Passive exposure: Never   Smokeless tobacco: Never  Vaping Use   Vaping status: Never Used  Substance Use Topics   Alcohol use: Yes    Alcohol/week: 6.0 standard drinks of alcohol    Types: 2 Cans of beer, 4 Shots of liquor per week   Drug use: Not Currently    Types: Cocaine    Comment: 12/20/2016 "might have used some the other day; I'm not sure"     Family History: The patient's family history includes Allergies in his brother, mother, and sister; Asthma in his mother; Coronary artery disease in his father; Deep vein thrombosis in his brother; Heart attack in his father. There is no history of Colon cancer, Liver cancer, Esophageal cancer, Colon polyps, Rectal cancer, Pancreatic cancer, or Stomach cancer.  ROS:   Please see the history of present illness.    (+) Shortness of breath (with exertion) (+) Chest discomfort (with exertion) (+) Fatigue (with exertion) (+) leg pain while walking All other systems reviewed and are negative.  EKGs/Labs/Other Studies Reviewed:    The following studies were reviewed today:  Echo 02/08/2021: IMPRESSIONS   1. Left ventricular ejection fraction, by estimation, is 40 to 45%.  The  left ventricle has mildly decreased function. The left ventricle  demonstrates global hypokinesis. There is mild concentric left ventricular  hypertrophy. Left ventricular diastolic  parameters are consistent with Grade I diastolic dysfunction (impaired  relaxation).   2. Right ventricular systolic function is mildly reduced. The right  ventricular size is normal. Tricuspid regurgitation signal is inadequate  for assessing PA pressure.   3. Left atrial size was mildly dilated.   4. The mitral valve is normal in structure. No evidence of mitral valve  regurgitation.   5. The aortic valve is tricuspid. Aortic valve regurgitation is not  visualized. No aortic stenosis is present.   6. Aortic dilatation noted. There is mild dilatation of the aortic root,  measuring 40 mm.   7. The inferior vena cava is normal in size with greater than 50%  respiratory variability, suggesting right atrial pressure of 3 mmHg.   Comparison(s): No significant change from prior study. Prior images  reviewed side by side.   Echo 07/07/2020: 1. Left ventricular ejection fraction, by estimation, is 40 to 45%. The  left ventricle has mild to moderately decreased function. The left  ventricle demonstrates global hypokinesis. There is mild left ventricular  hypertrophy. Left ventricular diastolic  parameters are consistent with Grade I diastolic dysfunction (impaired  relaxation). There is abnormal septal motion.   2. Right ventricular systolic function is normal. The right ventricular  size is normal.   3. The mitral valve is normal in structure. Trivial mitral valve  regurgitation. No evidence of mitral stenosis.   4. The aortic valve is tricuspid. Aortic valve regurgitation is not  visualized. No aortic stenosis is present.   5. Aortic dilatation noted. There is mild dilatation of the aortic root  measuring 40 mm.   Comparison(s): A prior study was performed on 11/29/2017. No significant  change from  prior study. Prior images reviewed side by side.   EKG: EKG is personally reviewed. EKG Interpretation Date/Time:  Monday December 04 2022 09:47:47 EDT Ventricular Rate:  67 PR Interval:  204 QRS Duration:  80 QT Interval:  466 QTC Calculation: 492 R Axis:   -3  Text Interpretation: Normal sinus rhythm Prolonged QT Confirmed by Weston Brass ([id number]) on 12/04/2022 9:49:47  AM   06/14/2022: Sinus bradycardia. 1st degree AV block. 08/13/2020: NSR, rate 66 bpm.   Recent Labs: 06/07/2022: NT-Pro BNP 246 10/05/2022: ALT 19; BUN 12; Creatinine, Ser 0.72; Hemoglobin 14.5; Platelets 169; Potassium 4.8; Sodium 139; TSH 2.220  Recent Lipid Panel    Component Value Date/Time   CHOL 218 (H) 12/07/2021 0908   TRIG 99 12/07/2021 0908   HDL 61 12/07/2021 0908   CHOLHDL 2.4 08/13/2020 1105   CHOLHDL 3.1 01/01/2015 0208   VLDL 12 01/01/2015 0208   LDLCALC 139 (H) 12/07/2021 0908    Physical Exam:    VS:  BP 114/70   Pulse 67   Ht 6\' 2"  (1.88 m)   Wt 176 lb 12.8 oz (80.2 kg)   SpO2 99%   BMI 22.70 kg/m     Wt Readings from Last 5 Encounters:  12/04/22 176 lb 12.8 oz (80.2 kg)  10/05/22 175 lb 12.8 oz (79.7 kg)  07/11/22 175 lb 12.8 oz (79.7 kg)  06/21/22 182 lb (82.6 kg)  06/14/22 181 lb (82.1 kg)    Constitutional: No acute distress Eyes: sclera non-icteric, normal conjunctiva and lids ENMT: normal dentition, moist mucous membranes Cardiovascular: regular rhythm, normal rate, no murmurs. S1 and S2 normal. Radial pulses normal bilaterally. No jugular venous distention.  Respiratory: clear to auscultation bilaterally GI : normal bowel sounds, soft and nontender. No distention.   MSK: extremities warm, well perfused. No edema.  Venous varicosities in the lower extremities. NEURO: grossly nonfocal exam, moves all extremities. PSYCH: alert and oriented x 3, normal mood and affect.   ASSESSMENT:    1. Hypertension, unspecified type   2. Exertional chest pain   3. Hypertensive  heart disease with chronic combined systolic and diastolic congestive heart failure (HCC)   4. History of pulmonary embolism   5. Coronary artery disease involving native coronary artery of native heart without angina pectoris   6. Hyperlipidemia, unspecified hyperlipidemia type   7. Chronic combined systolic and diastolic congestive heart failure (HCC)   8. Statin myopathy   9. Statin intolerance     PLAN:    DOE Exertional chest discomfort, stable angina Known nonobstructive CAD cath 2016. -Continue to recommend follow-up with pulmonary medicine it appears he was supposed to have PFTs.  He has been started on Trelegy but does not feel a significant benefit in symptoms. - repeat echo with mild reduced EF.  Now with pulmonary evaluation and inhaler use with no change in exertional symptoms.  Given exertional dyspnea and some chest tightness which may represent stable angina, known LAD disease from prior cath, and continued exercise intolerance would recommend Lexiscan Myoview to risk stratify.  Recommend if stress test is abnormal consider cardiac catheterization given prior documented CAD. -continue eliquis, jardiance, repatha, lasix, imdur, losartan, nitro.   Hyperlipidemia, unspecified hyperlipidemia type Statin intolerance - repatha per CVRR, tolerating well prior to running out.  He is participating in a research study and has that appointment next today, if lipids are not checked as part of the research study, would check these at his next follow-up appointment.  Essential hypertension - bp stable today.  Low normal.  This may limit our ability to add heart failure therapy though at this time it appears he may need up titration.  Meds are as noted below, at next visit would consider either adding heart failure specific beta-blocker or spironolactone.  Chronic systolic heart failure (HCC) Heart Failure Therapy ACE-I/ARB/ARNI: losartan 25 mg daily  BB: none MRA: none currently,  could  add spironolactone 25 mg daily if BP tolerates SGLT2I: jardiance 10 mg daily Diuretic plan: furosemide 40 mg as needed.    Informed Consent   Shared Decision Making/Informed Consent The risks [chest pain, shortness of breath, cardiac arrhythmias, dizziness, blood pressure fluctuations, myocardial infarction, stroke/transient ischemic attack, nausea, vomiting, allergic reaction, radiation exposure, metallic taste sensation and life-threatening complications (estimated to be 1 in 10,000)], benefits (risk stratification, diagnosing coronary artery disease, treatment guidance) and alternatives of a nuclear stress test were discussed in detail with Mr. Hartong and he agrees to proceed.      Total time of encounter: 30 minutes total time of encounter, including 20 minutes spent in face-to-face patient care on the date of this encounter. This time includes coordination of care and counseling regarding above mentioned problem list. Remainder of non-face-to-face time involved reviewing chart documents/testing relevant to the patient encounter and documentation in the medical record. I have independently reviewed documentation from referring provider.   Weston Brass, MD, [facility]   [facility]    Medication Adjustments/Labs and Tests Ordered: Current medicines are reviewed at length with the patient today.  Concerns regarding medicines are outlined above.   Orders Placed This Encounter  Procedures   EKG 12-Lead   No orders of the defined types were placed in this encounter.  There are no Patient Instructions on file for this visit.

## 2022-12-05 ENCOUNTER — Other Ambulatory Visit: Payer: Self-pay

## 2022-12-05 ENCOUNTER — Ambulatory Visit: Payer: Medicare HMO

## 2022-12-05 NOTE — Telephone Encounter (Signed)
Spoke with patient . Patient reports that he with to the eye doctor that his PCP referred him too. That eye Doctor referred him to another eye doctor and they do not accept his insurance. Advised that he should call the office of the eye doctor that referred him  and make them aware that his insurance is not accepted there. Advised that he should request that he be referred to an eye doctor that accepts his insurance. Patient said that he would do that. Advised that if he needs further assistance to let us know.

## 2022-12-05 NOTE — Telephone Encounter (Signed)
Chief Complaint: Eye swollen  Symptoms: Right eye swollen with clear drainage coming from the eye, itchy only in the morning, blurred vision when watching TV Frequency: Constant, ongoing x 2-3 months Pertinent Negatives: Patient denies eye pain, swollen shut, fever Disposition: [] ED /[] Urgent Care (no appt availability in office) / [] Appointment(In office/virtual)/ []  [facility]/ [] Home Care/ [] Refused Recommended Disposition /[] [facility]/ [x]  Follow-up with PCP Additional Notes: Patient states his eye has been swollen moderately for almost 3 months now with clear drainage. Patient had an eye exam on done on 11/20/22 and was told his right eye is overproducing tears and he will need to be referred to another eye doctor for further evaluation. Patient stated the new referral location does not take his insurance and he will not be able to schedule an appointment because he can not afford to pay out of pocket. Patient is requesting a follow-up with PCP and requesting a course of prednisone will be prescribed because it has helped improve symptoms in the past. No appointments available in office until November. Care advice was given and patient was advised to go to Urgent care if symptoms get worse. Advised will forward message to PCP for additional recommendations at this time.  Summary: swollen leaking eye   Pt states that the office he was referred to regarding his right eye called him back and advised that they do not take medicaid or medicare and he will have to pay out of pocket. Pt states that he does not have any money at this time to pay for the visit. Pt states that his right eye is swollen and is leaking water. Pt states that his PCP knows what is going on with his eye and he is needing an appointment with his PCP and is needing to know what to do. Please advise.     Reason for Disposition  Eyelid swelling is a chronic problem (recurrent or ongoing AND present > 4  weeks)  Answer Assessment - Initial Assessment Questions 1. ONSET: "When did the swelling start?" (e.g., minutes, hours, days)     2 months  2. LOCATION: "What part of the eyelids is swollen?"     Right eyelid 3. SEVERITY: "How swollen is it?"     Mild to moderate it is not completely closed  4. ITCHING: "Is there any itching?" If Yes, ask: "How much?"   (Scale 1-10; mild, moderate or severe)     Only in the morning it is mildly itchy 5. PAIN: "Is the swelling painful to touch?" If Yes, ask: "How painful is it?"   (Scale 1-10; mild, moderate or severe)     0/10 6. FEVER: "Do you have a fever?" If Yes, ask: "What is it, how was it measured, and when did it start?"      No  7. CAUSE: "What do you think is causing the swelling?"     The eye doctor said I produce too many tears  8. RECURRENT SYMPTOM: "Have you had eyelid swelling before?" If Yes, ask: "When was the last time?" "What happened that time?"     Ongoing 3 months  9. OTHER SYMPTOMS: "Do you have any other symptoms?" (e.g., blurred vision, eye discharge, rash, runny nose)     Eye discharge and blurred vision when watching TV  Protocols used: Eye - Swelling-A-AH

## 2022-12-06 ENCOUNTER — Other Ambulatory Visit: Payer: Self-pay

## 2022-12-07 ENCOUNTER — Other Ambulatory Visit (HOSPITAL_COMMUNITY): Payer: Self-pay

## 2022-12-07 ENCOUNTER — Telehealth (HOSPITAL_COMMUNITY): Payer: Self-pay

## 2022-12-07 NOTE — Telephone Encounter (Signed)
Detailed instructions left on the patient's answering machine. Asked to call back with any questions. S.Aby Gessel CCT

## 2022-12-12 ENCOUNTER — Ambulatory Visit (HOSPITAL_COMMUNITY): Payer: Medicare HMO | Attending: Internal Medicine

## 2022-12-12 DIAGNOSIS — R079 Chest pain, unspecified: Secondary | ICD-10-CM | POA: Diagnosis not present

## 2022-12-12 LAB — MYOCARDIAL PERFUSION IMAGING
LV dias vol: 102 mL (ref 62–150)
LV sys vol: 51 mL
Nuc Stress EF: 50 %
Peak HR: 91 {beats}/min
Rest HR: 59 {beats}/min
Rest Nuclear Isotope Dose: 9.6 mCi
SDS: 4
SRS: 6
SSS: 10
ST Depression (mm): 0 mm
Stress Nuclear Isotope Dose: 31.6 mCi
TID: 0.96

## 2022-12-12 MED ORDER — TECHNETIUM TC 99M TETROFOSMIN IV KIT
31.6000 | PACK | Freq: Once | INTRAVENOUS | Status: AC | PRN
  Administered 2022-12-12: 31.6 via INTRAVENOUS

## 2022-12-12 MED ORDER — REGADENOSON 0.4 MG/5ML IV SOLN
0.4000 mg | Freq: Once | INTRAVENOUS | Status: AC
  Administered 2022-12-12: 0.4 mg via INTRAVENOUS

## 2022-12-12 MED ORDER — TECHNETIUM TC 99M TETROFOSMIN IV KIT
9.6000 | PACK | Freq: Once | INTRAVENOUS | Status: AC | PRN
  Administered 2022-12-12: 9.6 via INTRAVENOUS

## 2022-12-19 DIAGNOSIS — Z79899 Other long term (current) drug therapy: Secondary | ICD-10-CM | POA: Diagnosis not present

## 2022-12-28 ENCOUNTER — Other Ambulatory Visit: Payer: Self-pay | Admitting: Physician Assistant

## 2022-12-28 ENCOUNTER — Ambulatory Visit: Payer: Medicare HMO | Attending: Physician Assistant | Admitting: Physician Assistant

## 2022-12-28 ENCOUNTER — Other Ambulatory Visit (HOSPITAL_COMMUNITY): Payer: Self-pay

## 2022-12-28 VITALS — BP 120/68 | HR 79 | Ht 74.0 in | Wt 173.0 lb

## 2022-12-28 DIAGNOSIS — Z01818 Encounter for other preprocedural examination: Secondary | ICD-10-CM | POA: Diagnosis not present

## 2022-12-28 DIAGNOSIS — Z86718 Personal history of other venous thrombosis and embolism: Secondary | ICD-10-CM

## 2022-12-28 DIAGNOSIS — I10 Essential (primary) hypertension: Secondary | ICD-10-CM | POA: Diagnosis not present

## 2022-12-28 DIAGNOSIS — I2510 Atherosclerotic heart disease of native coronary artery without angina pectoris: Secondary | ICD-10-CM | POA: Diagnosis not present

## 2022-12-28 DIAGNOSIS — E785 Hyperlipidemia, unspecified: Secondary | ICD-10-CM | POA: Diagnosis not present

## 2022-12-28 DIAGNOSIS — R079 Chest pain, unspecified: Secondary | ICD-10-CM

## 2022-12-28 DIAGNOSIS — R9439 Abnormal result of other cardiovascular function study: Secondary | ICD-10-CM | POA: Diagnosis not present

## 2022-12-28 MED ORDER — EVOLOCUMAB 140 MG/ML ~~LOC~~ SOAJ
140.0000 mg | SUBCUTANEOUS | 3 refills | Status: AC
Start: 2022-12-28 — End: ?
  Filled 2022-12-28 – 2023-01-02 (×2): qty 6, 84d supply, fill #0
  Filled 2023-08-31 – 2023-09-07 (×3): qty 6, 84d supply, fill #1
  Filled 2023-11-28: qty 6, 84d supply, fill #2

## 2022-12-28 NOTE — H&P (View-Only) (Signed)
his anti-anginal medications and encourage tobacco cessation. He can be discharged home later today. Follow up with Dr. Marca Ancona following discharge.  Findings Coronary Findings Diagnostic  Dominance: Right  Left Anterior Descending discrete.  First Diagonal Branch chronic total occlusion. The lesion was previously treated with a bare metal stentone to two years ago.  Ramus  Intermedius The vessel is moderate in size. discrete.  Left Circumflex The vessel is moderate in size. discrete.  First Obtuse Marginal Branch The vessel is small in size.  Second Obtuse Marginal Branch The vessel is small in size.  Right Coronary Artery discrete. diffuse.  Intervention  No interventions have been documented.   STRESS TESTS  MYOCARDIAL PERFUSION IMAGING 12/12/2022  Narrative   Small, moderate, fixed defect in the apical anterior segment consistent with infarction. Medium, moderate, fixed defect in the mid to apical inferior/inferolateral segments consistent with infarction. Findings are consistent with prior infarction, multi-vessel. No ischemia. Compared to the prior study, the inferior/inferolateral infarction is new.   LV perfusion is abnormal. There is no evidence of ischemia. There is evidence of infarction. Defect 1: There is a small defect with moderate reduction in uptake present in the apical anterior location(s) that is fixed. There is abnormal wall motion in the defect area. Consistent with infarction. Defect 2: There is a medium defect with moderate reduction in uptake present in the apical to mid inferior and inferolateral location(s) that is fixed. There is abnormal wall motion in the defect area. Consistent with infarction.   Findings are consistent with infarction. The study is intermediate risk.   Left ventricular function is normal. Nuclear stress EF: 50%. The left ventricular ejection fraction is mildly decreased (45-54%). End diastolic cavity size is normal.   ECHOCARDIOGRAM  ECHOCARDIOGRAM COMPLETE 06/28/2022  Narrative ECHOCARDIOGRAM REPORT    Patient Name:   RAYLON LAMSON Date of Exam: 06/28/2022 Medical Rec #:  [id number]         Height:       74.0 in Accession #:    [id number]        Weight:       182.0 lb Date of Birth:  31-Jan-1961         BSA:          2.088 m Patient Age:    62 years          BP:           115/66 mmHg Patient  Gender: M                 HR:           77 bpm. Exam Location:  Outpatient  Procedure: 2D Echo, Color Doppler and Cardiac Doppler  Indications:    CHF  History:        Patient has prior history of Echocardiogram examinations. CHF, CAD, COPD, Signs/Symptoms:Shortness of Breath; Risk Factors:Hypertension, Current Smoker and Cocaine abuse.  Sonographer:    Milbert Coulter Referring Phys: 78 ENOBONG NEWLIN  IMPRESSIONS   1. Limited study due to poor echo windows. 2. Left ventricular ejection fraction, by estimation, is 40 to 45%. The left ventricle has mildly decreased function. Left ventricular endocardial border not optimally defined to evaluate regional wall motion. There is mild concentric left ventricular hypertrophy. Left ventricular diastolic parameters are consistent with Grade I diastolic dysfunction (impaired relaxation). 3. Right ventricular systolic function is normal. The right ventricular size is normal. Tricuspid regurgitation signal is inadequate for assessing PA pressure. 4. The mitral valve  his anti-anginal medications and encourage tobacco cessation. He can be discharged home later today. Follow up with Dr. Marca Ancona following discharge.  Findings Coronary Findings Diagnostic  Dominance: Right  Left Anterior Descending discrete.  First Diagonal Branch chronic total occlusion. The lesion was previously treated with a bare metal stentone to two years ago.  Ramus  Intermedius The vessel is moderate in size. discrete.  Left Circumflex The vessel is moderate in size. discrete.  First Obtuse Marginal Branch The vessel is small in size.  Second Obtuse Marginal Branch The vessel is small in size.  Right Coronary Artery discrete. diffuse.  Intervention  No interventions have been documented.   STRESS TESTS  MYOCARDIAL PERFUSION IMAGING 12/12/2022  Narrative   Small, moderate, fixed defect in the apical anterior segment consistent with infarction. Medium, moderate, fixed defect in the mid to apical inferior/inferolateral segments consistent with infarction. Findings are consistent with prior infarction, multi-vessel. No ischemia. Compared to the prior study, the inferior/inferolateral infarction is new.   LV perfusion is abnormal. There is no evidence of ischemia. There is evidence of infarction. Defect 1: There is a small defect with moderate reduction in uptake present in the apical anterior location(s) that is fixed. There is abnormal wall motion in the defect area. Consistent with infarction. Defect 2: There is a medium defect with moderate reduction in uptake present in the apical to mid inferior and inferolateral location(s) that is fixed. There is abnormal wall motion in the defect area. Consistent with infarction.   Findings are consistent with infarction. The study is intermediate risk.   Left ventricular function is normal. Nuclear stress EF: 50%. The left ventricular ejection fraction is mildly decreased (45-54%). End diastolic cavity size is normal.   ECHOCARDIOGRAM  ECHOCARDIOGRAM COMPLETE 06/28/2022  Narrative ECHOCARDIOGRAM REPORT    Patient Name:   RAYLON LAMSON Date of Exam: 06/28/2022 Medical Rec #:  [id number]         Height:       74.0 in Accession #:    [id number]        Weight:       182.0 lb Date of Birth:  31-Jan-1961         BSA:          2.088 m Patient Age:    62 years          BP:           115/66 mmHg Patient  Gender: M                 HR:           77 bpm. Exam Location:  Outpatient  Procedure: 2D Echo, Color Doppler and Cardiac Doppler  Indications:    CHF  History:        Patient has prior history of Echocardiogram examinations. CHF, CAD, COPD, Signs/Symptoms:Shortness of Breath; Risk Factors:Hypertension, Current Smoker and Cocaine abuse.  Sonographer:    Milbert Coulter Referring Phys: 78 ENOBONG NEWLIN  IMPRESSIONS   1. Limited study due to poor echo windows. 2. Left ventricular ejection fraction, by estimation, is 40 to 45%. The left ventricle has mildly decreased function. Left ventricular endocardial border not optimally defined to evaluate regional wall motion. There is mild concentric left ventricular hypertrophy. Left ventricular diastolic parameters are consistent with Grade I diastolic dysfunction (impaired relaxation). 3. Right ventricular systolic function is normal. The right ventricular size is normal. Tricuspid regurgitation signal is inadequate for assessing PA pressure. 4. The mitral valve  his anti-anginal medications and encourage tobacco cessation. He can be discharged home later today. Follow up with Dr. Marca Ancona following discharge.  Findings Coronary Findings Diagnostic  Dominance: Right  Left Anterior Descending discrete.  First Diagonal Branch chronic total occlusion. The lesion was previously treated with a bare metal stentone to two years ago.  Ramus  Intermedius The vessel is moderate in size. discrete.  Left Circumflex The vessel is moderate in size. discrete.  First Obtuse Marginal Branch The vessel is small in size.  Second Obtuse Marginal Branch The vessel is small in size.  Right Coronary Artery discrete. diffuse.  Intervention  No interventions have been documented.   STRESS TESTS  MYOCARDIAL PERFUSION IMAGING 12/12/2022  Narrative   Small, moderate, fixed defect in the apical anterior segment consistent with infarction. Medium, moderate, fixed defect in the mid to apical inferior/inferolateral segments consistent with infarction. Findings are consistent with prior infarction, multi-vessel. No ischemia. Compared to the prior study, the inferior/inferolateral infarction is new.   LV perfusion is abnormal. There is no evidence of ischemia. There is evidence of infarction. Defect 1: There is a small defect with moderate reduction in uptake present in the apical anterior location(s) that is fixed. There is abnormal wall motion in the defect area. Consistent with infarction. Defect 2: There is a medium defect with moderate reduction in uptake present in the apical to mid inferior and inferolateral location(s) that is fixed. There is abnormal wall motion in the defect area. Consistent with infarction.   Findings are consistent with infarction. The study is intermediate risk.   Left ventricular function is normal. Nuclear stress EF: 50%. The left ventricular ejection fraction is mildly decreased (45-54%). End diastolic cavity size is normal.   ECHOCARDIOGRAM  ECHOCARDIOGRAM COMPLETE 06/28/2022  Narrative ECHOCARDIOGRAM REPORT    Patient Name:   RAYLON LAMSON Date of Exam: 06/28/2022 Medical Rec #:  [id number]         Height:       74.0 in Accession #:    [id number]        Weight:       182.0 lb Date of Birth:  31-Jan-1961         BSA:          2.088 m Patient Age:    62 years          BP:           115/66 mmHg Patient  Gender: M                 HR:           77 bpm. Exam Location:  Outpatient  Procedure: 2D Echo, Color Doppler and Cardiac Doppler  Indications:    CHF  History:        Patient has prior history of Echocardiogram examinations. CHF, CAD, COPD, Signs/Symptoms:Shortness of Breath; Risk Factors:Hypertension, Current Smoker and Cocaine abuse.  Sonographer:    Milbert Coulter Referring Phys: 78 ENOBONG NEWLIN  IMPRESSIONS   1. Limited study due to poor echo windows. 2. Left ventricular ejection fraction, by estimation, is 40 to 45%. The left ventricle has mildly decreased function. Left ventricular endocardial border not optimally defined to evaluate regional wall motion. There is mild concentric left ventricular hypertrophy. Left ventricular diastolic parameters are consistent with Grade I diastolic dysfunction (impaired relaxation). 3. Right ventricular systolic function is normal. The right ventricular size is normal. Tricuspid regurgitation signal is inadequate for assessing PA pressure. 4. The mitral valve  his anti-anginal medications and encourage tobacco cessation. He can be discharged home later today. Follow up with Dr. Marca Ancona following discharge.  Findings Coronary Findings Diagnostic  Dominance: Right  Left Anterior Descending discrete.  First Diagonal Branch chronic total occlusion. The lesion was previously treated with a bare metal stentone to two years ago.  Ramus  Intermedius The vessel is moderate in size. discrete.  Left Circumflex The vessel is moderate in size. discrete.  First Obtuse Marginal Branch The vessel is small in size.  Second Obtuse Marginal Branch The vessel is small in size.  Right Coronary Artery discrete. diffuse.  Intervention  No interventions have been documented.   STRESS TESTS  MYOCARDIAL PERFUSION IMAGING 12/12/2022  Narrative   Small, moderate, fixed defect in the apical anterior segment consistent with infarction. Medium, moderate, fixed defect in the mid to apical inferior/inferolateral segments consistent with infarction. Findings are consistent with prior infarction, multi-vessel. No ischemia. Compared to the prior study, the inferior/inferolateral infarction is new.   LV perfusion is abnormal. There is no evidence of ischemia. There is evidence of infarction. Defect 1: There is a small defect with moderate reduction in uptake present in the apical anterior location(s) that is fixed. There is abnormal wall motion in the defect area. Consistent with infarction. Defect 2: There is a medium defect with moderate reduction in uptake present in the apical to mid inferior and inferolateral location(s) that is fixed. There is abnormal wall motion in the defect area. Consistent with infarction.   Findings are consistent with infarction. The study is intermediate risk.   Left ventricular function is normal. Nuclear stress EF: 50%. The left ventricular ejection fraction is mildly decreased (45-54%). End diastolic cavity size is normal.   ECHOCARDIOGRAM  ECHOCARDIOGRAM COMPLETE 06/28/2022  Narrative ECHOCARDIOGRAM REPORT    Patient Name:   RAYLON LAMSON Date of Exam: 06/28/2022 Medical Rec #:  [id number]         Height:       74.0 in Accession #:    [id number]        Weight:       182.0 lb Date of Birth:  31-Jan-1961         BSA:          2.088 m Patient Age:    62 years          BP:           115/66 mmHg Patient  Gender: M                 HR:           77 bpm. Exam Location:  Outpatient  Procedure: 2D Echo, Color Doppler and Cardiac Doppler  Indications:    CHF  History:        Patient has prior history of Echocardiogram examinations. CHF, CAD, COPD, Signs/Symptoms:Shortness of Breath; Risk Factors:Hypertension, Current Smoker and Cocaine abuse.  Sonographer:    Milbert Coulter Referring Phys: 78 ENOBONG NEWLIN  IMPRESSIONS   1. Limited study due to poor echo windows. 2. Left ventricular ejection fraction, by estimation, is 40 to 45%. The left ventricle has mildly decreased function. Left ventricular endocardial border not optimally defined to evaluate regional wall motion. There is mild concentric left ventricular hypertrophy. Left ventricular diastolic parameters are consistent with Grade I diastolic dysfunction (impaired relaxation). 3. Right ventricular systolic function is normal. The right ventricular size is normal. Tricuspid regurgitation signal is inadequate for assessing PA pressure. 4. The mitral valve  Cardiology Office Note:  .   Date:  12/28/2022  ID:  [name], DOB November 18, 1960, MRN [id number] PCP: Hoy Register, MD  [facility] Providers Cardiologist:  Parke Poisson, MD     History of Present Illness: .   GREGOIRE BENNIS is a 62 y.o. male with PMH of CAD s/p BMS to D1 in 2015 (noted to be chronically occluded by cath in 12/2014 with 30% RCA, 20% left circumflex and a 50% proximal to mid LAD lesion), PE/DVT s/p IVC filter placement in 2012, COPD, chronic diastolic CHF, hypertension, hyperlipidemia, chronic hepatitis C, history of substance use and tobacco use.  Patient had lower extremity weakness with activity, ABI was unremarkable for obstructive disease.  He previously underwent lumbar spine fusion by Dr. Dutch Quint, there was sign of radiculopathy in the lumbar spine.  Patient also has dyspnea on exertion which has been chronic and endorse some chest tightness.  He has been started on Trelegy after completing pulmonary evaluation.  This has made no difference with his dyspnea on exertion.  He was seen by Dr. Jacques Navy on 12/04/2018 for who recommended ischemic evaluation.  Subsequent Myoview obtained on 12/12/2022 showed a small moderate fixed defect in the apical anterior segment consistent with infarction, medium moderate fixed defect in the mid to apical inferior/inferolateral segment consistent with infarction, finding consistent with prior infarction, there was no ischemia.  EF 50%.  Patient presents today for follow-up.  He continued to have shortness of breath with exertion and chest tightness after climbing up a hill.  I discussed with the patient between he medical management versus cardiac catheterization, he opted to proceed with cardiac catheterization for more definitive evaluation.  I discussed his case with Dr. Jacques Navy who is also agreeable with more definitive evaluation.  Risk and benefit of the procedure has been discussed with the patient who is agreeable to  proceed.  He is on Eliquis for history of DVT/PE, he is not sure if his IVC filter was removed since placed in 2012.  Last cardiac catheterization in 2016 was done via right radial artery.  He has been instructed to hold Eliquis for 2 days prior to the procedure.  He reported he had a total of 9 DVT and 1 PE in the past, I am unable to confirm this.  I discussed with our clinical pharmacist, there is no Lovenox bridging with Eliquis.  ROS:   Patient complains of dyspnea with exertion and chest tightness after climbing up a hill.  Studies Reviewed: .        Cardiac Studies & Procedures   CARDIAC CATHETERIZATION  CARDIAC CATHETERIZATION 01/04/2015  Narrative  Prox RCA lesion, 30% stenosed.  Mid RCA to Dist RCA lesion, 30% stenosed.  Ost Cx to Prox Cx lesion, 20% stenosed.  Ramus lesion, 60% stenosed.  Prox LAD to Mid LAD lesion, 50% stenosed.  Ost 1st Diag to 1st Diag lesion, 100% stenosed. The lesion was previously treated with a bare metal stentone to two years ago.  The left ventricular systolic function is normal.  1. Double vessel CAD 2. Moderate stenosis moderate caliber Ramus Intermediate branch, does not appear to be flow limiting. 3. Mild to moderate disease in the LAD, RCA and Circumflex. 4. Chronic total occlusion bare metal stent in Diagonal branch. 5. Low normal LV systolic function  Recommendations: Continue medical management of CAD. He admits today to continued tobacco abuse and medication non-compliance. He responded poorly to bare metal stenting in the past. I would maximize

## 2022-12-28 NOTE — Patient Instructions (Addendum)
Medication Instructions:  HOLD ELIQUIS FOR 2 DAYS PRIOR TO PROCEDURE.  TAKE ASPIRIN 81 MG THE MORNING OF PROCEDURE.  *If you need a refill on your cardiac medications before your next appointment, please call your pharmacy*   Lab Work: CBC AND BMP TODAY If you have labs (blood work) drawn today and your tests are completely normal, you will receive your results only by: MyChart Message (if you have MyChart) OR A paper copy in the mail If you have any lab test that is abnormal or we need to change your treatment, we will call you to review the results.   Testing/Procedures:       Cardiac/Peripheral Catheterization   You are scheduled for a Cardiac Catheterization on Tuesday, October 8 with Dr. Peter [place].  1. Please arrive at the [facility] (Main Entrance A) at [facility]: [address] [place], [place] [address] at 8:30 AM (This time is 2 hour(s) before your procedure to ensure your preparation). Free valet parking service is available. You will check in at ADMITTING. The support person will be asked to wait in the waiting room.  It is OK to have someone drop you off and come back when you are ready to be discharged.        Special note: Every effort is made to have your procedure done on time. Please understand that emergencies sometimes delay scheduled procedures.  2. Diet: Do not eat solid foods after midnight.  You may have clear liquids until 5 AM the day of the procedure.  3. Labs: You will need to have blood drawn today 12/28/22.  4. Medication instructions in preparation for your procedure:   Contrast Allergy: No  Stop taking Eliquis (Apixiban) on Sunday, October 6.  On the morning of your procedure, take Aspirin 81 mg and any morning medicines NOT listed above.  You may use sips of water.  5. Plan to go home the same day, you will only stay overnight if medically necessary. 6. You MUST have a responsible adult to drive you home. 7. An adult MUST be  with you the first 24 hours after you arrive home. 8. Bring a current list of your medications, and the last time and date medication taken. 9. Bring ID and current insurance cards. 10.Please wear clothes that are easy to get on and off and wear slip-on shoes.  Thank you for allowing [place] to care for you!   -- [facility]    Follow-Up: At [facility], you and your health needs are our priority.  As part of our continuing mission to provide you with exceptional heart care, we have created designated Provider Care Teams.  These Care Teams include your primary Cardiologist (physician) and Advanced Practice Providers (APPs -  Physician Assistants and Nurse Practitioners) who all work together to provide you with the care you need, when you need it.  We recommend signing up for the patient portal called "MyChart".  Sign up information is provided on this After Visit Summary.  MyChart is used to connect with patients for Virtual Visits (Telemedicine).  Patients are able to view lab/test results, encounter notes, upcoming appointments, etc.  Non-urgent messages can be sent to your provider as well.   To learn more about what you can do with MyChart, go to [email or web address].    Your next appointment:   4 week(s) after procedure  Provider:   Azalee Course, PA

## 2022-12-28 NOTE — Progress Notes (Signed)
Cardiology Office Note:  .   Date:  12/28/2022  ID:  [name], DOB Feb 03, 1961, MRN [id number] PCP: Hoy Register, MD   HeartCare Providers Cardiologist:  Parke Poisson, MD     History of Present Illness: .   [name] is a 62 y.o. male with PMH of CAD s/p BMS to D1 in 2015 (noted to be chronically occluded by cath in 12/2014 with 30% RCA, 20% left circumflex and a 50% proximal to mid LAD lesion), PE/DVT s/p IVC filter placement in 2012, COPD, chronic diastolic CHF, hypertension, hyperlipidemia, chronic hepatitis C, history of substance use and tobacco use.  Patient had lower extremity weakness with activity, ABI was unremarkable for obstructive disease.  He previously underwent lumbar spine fusion by Dr. Dutch Quint, there was sign of radiculopathy in the lumbar spine.  Patient also has dyspnea on exertion which has been chronic and endorse some chest tightness.  He has been started on Trelegy after completing pulmonary evaluation.  This has made no difference with his dyspnea on exertion.  He was seen by Dr. Jacques Navy on 12/04/2018 for who recommended ischemic evaluation.  Subsequent Myoview obtained on 12/12/2022 showed a small moderate fixed defect in the apical anterior segment consistent with infarction, medium moderate fixed defect in the mid to apical inferior/inferolateral segment consistent with infarction, finding consistent with prior infarction, there was no ischemia.  EF 50%.  Patient presents today for follow-up.  He continued to have shortness of breath with exertion and chest tightness after climbing up a hill.  I discussed with the patient between he medical management versus cardiac catheterization, he opted to proceed with cardiac catheterization for more definitive evaluation.  I discussed his case with Dr. Jacques Navy who is also agreeable with more definitive evaluation.  Risk and benefit of the procedure has been discussed with the patient who is agreeable to  proceed.  He is on Eliquis for history of DVT/PE, he is not sure if his IVC filter was removed since placed in 2012.  Last cardiac catheterization in 2016 was done via right radial artery.  He has been instructed to hold Eliquis for 2 days prior to the procedure.  He reported he had a total of 9 DVT and 1 PE in the past, I am unable to confirm this.  I discussed with our clinical pharmacist, there is no Lovenox bridging with Eliquis.  ROS:   Patient complains of dyspnea with exertion and chest tightness after climbing up a hill.  Studies Reviewed: .        Cardiac Studies & Procedures   CARDIAC CATHETERIZATION  CARDIAC CATHETERIZATION 01/04/2015  Narrative  Prox RCA lesion, 30% stenosed.  Mid RCA to Dist RCA lesion, 30% stenosed.  Ost Cx to Prox Cx lesion, 20% stenosed.  Ramus lesion, 60% stenosed.  Prox LAD to Mid LAD lesion, 50% stenosed.  Ost 1st Diag to 1st Diag lesion, 100% stenosed. The lesion was previously treated with a bare metal stentone to two years ago.  The left ventricular systolic function is normal.  1. Double vessel CAD 2. Moderate stenosis moderate caliber Ramus Intermediate branch, does not appear to be flow limiting. 3. Mild to moderate disease in the LAD, RCA and Circumflex. 4. Chronic total occlusion bare metal stent in Diagonal branch. 5. Low normal LV systolic function  Recommendations: Continue medical management of CAD. He admits today to continued tobacco abuse and medication non-compliance. He responded poorly to bare metal stenting in the past. I would maximize  Cardiology Office Note:  .   Date:  12/28/2022  ID:  [name], DOB Feb 03, 1961, MRN [id number] PCP: Hoy Register, MD   HeartCare Providers Cardiologist:  Parke Poisson, MD     History of Present Illness: .   [name] is a 62 y.o. male with PMH of CAD s/p BMS to D1 in 2015 (noted to be chronically occluded by cath in 12/2014 with 30% RCA, 20% left circumflex and a 50% proximal to mid LAD lesion), PE/DVT s/p IVC filter placement in 2012, COPD, chronic diastolic CHF, hypertension, hyperlipidemia, chronic hepatitis C, history of substance use and tobacco use.  Patient had lower extremity weakness with activity, ABI was unremarkable for obstructive disease.  He previously underwent lumbar spine fusion by Dr. Dutch Quint, there was sign of radiculopathy in the lumbar spine.  Patient also has dyspnea on exertion which has been chronic and endorse some chest tightness.  He has been started on Trelegy after completing pulmonary evaluation.  This has made no difference with his dyspnea on exertion.  He was seen by Dr. Jacques Navy on 12/04/2018 for who recommended ischemic evaluation.  Subsequent Myoview obtained on 12/12/2022 showed a small moderate fixed defect in the apical anterior segment consistent with infarction, medium moderate fixed defect in the mid to apical inferior/inferolateral segment consistent with infarction, finding consistent with prior infarction, there was no ischemia.  EF 50%.  Patient presents today for follow-up.  He continued to have shortness of breath with exertion and chest tightness after climbing up a hill.  I discussed with the patient between he medical management versus cardiac catheterization, he opted to proceed with cardiac catheterization for more definitive evaluation.  I discussed his case with Dr. Jacques Navy who is also agreeable with more definitive evaluation.  Risk and benefit of the procedure has been discussed with the patient who is agreeable to  proceed.  He is on Eliquis for history of DVT/PE, he is not sure if his IVC filter was removed since placed in 2012.  Last cardiac catheterization in 2016 was done via right radial artery.  He has been instructed to hold Eliquis for 2 days prior to the procedure.  He reported he had a total of 9 DVT and 1 PE in the past, I am unable to confirm this.  I discussed with our clinical pharmacist, there is no Lovenox bridging with Eliquis.  ROS:   Patient complains of dyspnea with exertion and chest tightness after climbing up a hill.  Studies Reviewed: .        Cardiac Studies & Procedures   CARDIAC CATHETERIZATION  CARDIAC CATHETERIZATION 01/04/2015  Narrative  Prox RCA lesion, 30% stenosed.  Mid RCA to Dist RCA lesion, 30% stenosed.  Ost Cx to Prox Cx lesion, 20% stenosed.  Ramus lesion, 60% stenosed.  Prox LAD to Mid LAD lesion, 50% stenosed.  Ost 1st Diag to 1st Diag lesion, 100% stenosed. The lesion was previously treated with a bare metal stentone to two years ago.  The left ventricular systolic function is normal.  1. Double vessel CAD 2. Moderate stenosis moderate caliber Ramus Intermediate branch, does not appear to be flow limiting. 3. Mild to moderate disease in the LAD, RCA and Circumflex. 4. Chronic total occlusion bare metal stent in Diagonal branch. 5. Low normal LV systolic function  Recommendations: Continue medical management of CAD. He admits today to continued tobacco abuse and medication non-compliance. He responded poorly to bare metal stenting in the past. I would maximize  his anti-anginal medications and encourage tobacco cessation. He can be discharged home later today. Follow up with Dr. Marca Ancona following discharge.  Findings Coronary Findings Diagnostic  Dominance: Right  Left Anterior Descending discrete.  First Diagonal Branch chronic total occlusion. The lesion was previously treated with a bare metal stentone to two years ago.  Ramus  Intermedius The vessel is moderate in size. discrete.  Left Circumflex The vessel is moderate in size. discrete.  First Obtuse Marginal Branch The vessel is small in size.  Second Obtuse Marginal Branch The vessel is small in size.  Right Coronary Artery discrete. diffuse.  Intervention  No interventions have been documented.   STRESS TESTS  MYOCARDIAL PERFUSION IMAGING 12/12/2022  Narrative   Small, moderate, fixed defect in the apical anterior segment consistent with infarction. Medium, moderate, fixed defect in the mid to apical inferior/inferolateral segments consistent with infarction. Findings are consistent with prior infarction, multi-vessel. No ischemia. Compared to the prior study, the inferior/inferolateral infarction is new.   LV perfusion is abnormal. There is no evidence of ischemia. There is evidence of infarction. Defect 1: There is a small defect with moderate reduction in uptake present in the apical anterior location(s) that is fixed. There is abnormal wall motion in the defect area. Consistent with infarction. Defect 2: There is a medium defect with moderate reduction in uptake present in the apical to mid inferior and inferolateral location(s) that is fixed. There is abnormal wall motion in the defect area. Consistent with infarction.   Findings are consistent with infarction. The study is intermediate risk.   Left ventricular function is normal. Nuclear stress EF: 50%. The left ventricular ejection fraction is mildly decreased (45-54%). End diastolic cavity size is normal.   ECHOCARDIOGRAM  ECHOCARDIOGRAM COMPLETE 06/28/2022  Narrative ECHOCARDIOGRAM REPORT    Patient Name:   [name] Date of Exam: 06/28/2022 Medical Rec #:  [id number]         Height:       74.0 in Accession #:    [id number]        Weight:       182.0 lb Date of Birth:  12/16/1960         BSA:          2.088 m Patient Age:    62 years          BP:           115/66 mmHg Patient  Gender: M                 HR:           77 bpm. Exam Location:  Outpatient  Procedure: 2D Echo, Color Doppler and Cardiac Doppler  Indications:    CHF  History:        Patient has prior history of Echocardiogram examinations. CHF, CAD, COPD, Signs/Symptoms:Shortness of Breath; Risk Factors:Hypertension, Current Smoker and Cocaine abuse.  Sonographer:    Milbert Coulter Referring Phys: 22 ENOBONG NEWLIN  IMPRESSIONS   1. Limited study due to poor echo windows. 2. Left ventricular ejection fraction, by estimation, is 40 to 45%. The left ventricle has mildly decreased function. Left ventricular endocardial border not optimally defined to evaluate regional wall motion. There is mild concentric left ventricular hypertrophy. Left ventricular diastolic parameters are consistent with Grade I diastolic dysfunction (impaired relaxation). 3. Right ventricular systolic function is normal. The right ventricular size is normal. Tricuspid regurgitation signal is inadequate for assessing PA pressure. 4. The mitral valve  his anti-anginal medications and encourage tobacco cessation. He can be discharged home later today. Follow up with Dr. Marca Ancona following discharge.  Findings Coronary Findings Diagnostic  Dominance: Right  Left Anterior Descending discrete.  First Diagonal Branch chronic total occlusion. The lesion was previously treated with a bare metal stentone to two years ago.  Ramus  Intermedius The vessel is moderate in size. discrete.  Left Circumflex The vessel is moderate in size. discrete.  First Obtuse Marginal Branch The vessel is small in size.  Second Obtuse Marginal Branch The vessel is small in size.  Right Coronary Artery discrete. diffuse.  Intervention  No interventions have been documented.   STRESS TESTS  MYOCARDIAL PERFUSION IMAGING 12/12/2022  Narrative   Small, moderate, fixed defect in the apical anterior segment consistent with infarction. Medium, moderate, fixed defect in the mid to apical inferior/inferolateral segments consistent with infarction. Findings are consistent with prior infarction, multi-vessel. No ischemia. Compared to the prior study, the inferior/inferolateral infarction is new.   LV perfusion is abnormal. There is no evidence of ischemia. There is evidence of infarction. Defect 1: There is a small defect with moderate reduction in uptake present in the apical anterior location(s) that is fixed. There is abnormal wall motion in the defect area. Consistent with infarction. Defect 2: There is a medium defect with moderate reduction in uptake present in the apical to mid inferior and inferolateral location(s) that is fixed. There is abnormal wall motion in the defect area. Consistent with infarction.   Findings are consistent with infarction. The study is intermediate risk.   Left ventricular function is normal. Nuclear stress EF: 50%. The left ventricular ejection fraction is mildly decreased (45-54%). End diastolic cavity size is normal.   ECHOCARDIOGRAM  ECHOCARDIOGRAM COMPLETE 06/28/2022  Narrative ECHOCARDIOGRAM REPORT    Patient Name:   [name] Date of Exam: 06/28/2022 Medical Rec #:  [id number]         Height:       74.0 in Accession #:    [id number]        Weight:       182.0 lb Date of Birth:  12/16/1960         BSA:          2.088 m Patient Age:    62 years          BP:           115/66 mmHg Patient  Gender: M                 HR:           77 bpm. Exam Location:  Outpatient  Procedure: 2D Echo, Color Doppler and Cardiac Doppler  Indications:    CHF  History:        Patient has prior history of Echocardiogram examinations. CHF, CAD, COPD, Signs/Symptoms:Shortness of Breath; Risk Factors:Hypertension, Current Smoker and Cocaine abuse.  Sonographer:    Milbert Coulter Referring Phys: 22 ENOBONG NEWLIN  IMPRESSIONS   1. Limited study due to poor echo windows. 2. Left ventricular ejection fraction, by estimation, is 40 to 45%. The left ventricle has mildly decreased function. Left ventricular endocardial border not optimally defined to evaluate regional wall motion. There is mild concentric left ventricular hypertrophy. Left ventricular diastolic parameters are consistent with Grade I diastolic dysfunction (impaired relaxation). 3. Right ventricular systolic function is normal. The right ventricular size is normal. Tricuspid regurgitation signal is inadequate for assessing PA pressure. 4. The mitral valve  his anti-anginal medications and encourage tobacco cessation. He can be discharged home later today. Follow up with Dr. Marca Ancona following discharge.  Findings Coronary Findings Diagnostic  Dominance: Right  Left Anterior Descending discrete.  First Diagonal Branch chronic total occlusion. The lesion was previously treated with a bare metal stentone to two years ago.  Ramus  Intermedius The vessel is moderate in size. discrete.  Left Circumflex The vessel is moderate in size. discrete.  First Obtuse Marginal Branch The vessel is small in size.  Second Obtuse Marginal Branch The vessel is small in size.  Right Coronary Artery discrete. diffuse.  Intervention  No interventions have been documented.   STRESS TESTS  MYOCARDIAL PERFUSION IMAGING 12/12/2022  Narrative   Small, moderate, fixed defect in the apical anterior segment consistent with infarction. Medium, moderate, fixed defect in the mid to apical inferior/inferolateral segments consistent with infarction. Findings are consistent with prior infarction, multi-vessel. No ischemia. Compared to the prior study, the inferior/inferolateral infarction is new.   LV perfusion is abnormal. There is no evidence of ischemia. There is evidence of infarction. Defect 1: There is a small defect with moderate reduction in uptake present in the apical anterior location(s) that is fixed. There is abnormal wall motion in the defect area. Consistent with infarction. Defect 2: There is a medium defect with moderate reduction in uptake present in the apical to mid inferior and inferolateral location(s) that is fixed. There is abnormal wall motion in the defect area. Consistent with infarction.   Findings are consistent with infarction. The study is intermediate risk.   Left ventricular function is normal. Nuclear stress EF: 50%. The left ventricular ejection fraction is mildly decreased (45-54%). End diastolic cavity size is normal.   ECHOCARDIOGRAM  ECHOCARDIOGRAM COMPLETE 06/28/2022  Narrative ECHOCARDIOGRAM REPORT    Patient Name:   [name] Date of Exam: 06/28/2022 Medical Rec #:  [id number]         Height:       74.0 in Accession #:    [id number]        Weight:       182.0 lb Date of Birth:  12/16/1960         BSA:          2.088 m Patient Age:    62 years          BP:           115/66 mmHg Patient  Gender: M                 HR:           77 bpm. Exam Location:  Outpatient  Procedure: 2D Echo, Color Doppler and Cardiac Doppler  Indications:    CHF  History:        Patient has prior history of Echocardiogram examinations. CHF, CAD, COPD, Signs/Symptoms:Shortness of Breath; Risk Factors:Hypertension, Current Smoker and Cocaine abuse.  Sonographer:    Milbert Coulter Referring Phys: 22 ENOBONG NEWLIN  IMPRESSIONS   1. Limited study due to poor echo windows. 2. Left ventricular ejection fraction, by estimation, is 40 to 45%. The left ventricle has mildly decreased function. Left ventricular endocardial border not optimally defined to evaluate regional wall motion. There is mild concentric left ventricular hypertrophy. Left ventricular diastolic parameters are consistent with Grade I diastolic dysfunction (impaired relaxation). 3. Right ventricular systolic function is normal. The right ventricular size is normal. Tricuspid regurgitation signal is inadequate for assessing PA pressure. 4. The mitral valve

## 2023-01-01 ENCOUNTER — Telehealth: Payer: Self-pay | Admitting: *Deleted

## 2023-01-01 NOTE — Telephone Encounter (Signed)
Call placed to patient to review cardiac cath instructions for 01/02/23, pt requests to reschedule cath to 01/11/23. Cardiac Cath has been rescheduled to 01/11/23 with Dr Clifton James, procedure instructions below reviewed with patient.  Cardiac Catheterization scheduled at [facility] for: Thursday January 11, 2023 10:30 AM Arrival time [facility] Main Entrance A at: 8:30 AM  Nothing to eat after midnight prior to procedure, clear liquids until 5 AM day of procedure.  Medication instructions: -Hold:  Eliquis-none starting 01/09/23 until post procedure  Patient reports he took Eliquis today and will continue until 01/09/23 when he will start to hold for procedure Jardiance-AM of procedure  Lasix-pt reports he is not taking. -Other usual morning medications can be taken with sips of water including aspirin 81 mg.  Plan to go home the same day, you will only stay overnight if medically necessary.  You must have responsible adult to drive you home.  Someone must be with you the first 24 hours after you arrive home.

## 2023-01-01 NOTE — Telephone Encounter (Signed)
Patient reports did not get BMP/CBC 12/29/22, reports lab was unable to get specimen, asked to patient to hydrate and return for lab. Patient reports he plans to hydrate and return to lab this week to get pre-procedure BMP/CBC.

## 2023-01-02 ENCOUNTER — Other Ambulatory Visit: Payer: Self-pay

## 2023-01-02 ENCOUNTER — Other Ambulatory Visit (HOSPITAL_COMMUNITY): Payer: Self-pay

## 2023-01-02 ENCOUNTER — Other Ambulatory Visit: Payer: Self-pay | Admitting: Internal Medicine

## 2023-01-02 DIAGNOSIS — Z86711 Personal history of pulmonary embolism: Secondary | ICD-10-CM

## 2023-01-02 DIAGNOSIS — I5032 Chronic diastolic (congestive) heart failure: Secondary | ICD-10-CM

## 2023-01-02 DIAGNOSIS — Z79899 Other long term (current) drug therapy: Secondary | ICD-10-CM | POA: Diagnosis not present

## 2023-01-03 ENCOUNTER — Other Ambulatory Visit (HOSPITAL_COMMUNITY): Payer: Self-pay

## 2023-01-05 DIAGNOSIS — Z01818 Encounter for other preprocedural examination: Secondary | ICD-10-CM | POA: Diagnosis not present

## 2023-01-05 LAB — CBC
Hematocrit: 42.5 % (ref 37.5–51.0)
Hemoglobin: 13.9 g/dL (ref 13.0–17.7)
MCH: 32.1 pg (ref 26.6–33.0)
MCHC: 32.7 g/dL (ref 31.5–35.7)
MCV: 98 fL — ABNORMAL HIGH (ref 79–97)
Platelets: 157 10*3/uL (ref 150–450)
RBC: 4.33 x10E6/uL (ref 4.14–5.80)
RDW: 13.7 % (ref 11.6–15.4)
WBC: 4.3 10*3/uL (ref 3.4–10.8)

## 2023-01-05 LAB — BASIC METABOLIC PANEL
BUN/Creatinine Ratio: 16 (ref 10–24)
BUN: 12 mg/dL (ref 8–27)
CO2: 25 mmol/L (ref 20–29)
Calcium: 9.3 mg/dL (ref 8.6–10.2)
Chloride: 104 mmol/L (ref 96–106)
Creatinine, Ser: 0.75 mg/dL — ABNORMAL LOW (ref 0.76–1.27)
Glucose: 83 mg/dL (ref 70–99)
Sodium: 137 mmol/L (ref 134–144)
eGFR: 102 mL/min/{1.73_m2} (ref >59)

## 2023-01-09 NOTE — Telephone Encounter (Signed)
Reviewed procedure instructions with patient, see instructions below, pt confirmed he stopped taking Eliquis today 01/09/23. Patient is aware he will need serum potassium done at hospital AM of procedure.

## 2023-01-11 ENCOUNTER — Other Ambulatory Visit: Payer: Self-pay

## 2023-01-11 ENCOUNTER — Encounter (HOSPITAL_COMMUNITY)
Admission: RE | Disposition: A | Discharge status: Home / Self Care | Payer: Self-pay | Source: Home / Self Care | Attending: Cardiovascular Disease

## 2023-01-11 ENCOUNTER — Encounter (HOSPITAL_COMMUNITY): Payer: Self-pay | Admitting: Cardiovascular Disease

## 2023-01-11 ENCOUNTER — Ambulatory Visit (HOSPITAL_COMMUNITY)
Admission: RE | Admit: 2023-01-11 | Discharge: 2023-01-11 | Disposition: A | Discharge status: Home / Self Care | Payer: Medicare HMO | Attending: Cardiovascular Disease | Admitting: Cardiovascular Disease

## 2023-01-11 DIAGNOSIS — I2584 Coronary atherosclerosis due to calcified coronary lesion: Secondary | ICD-10-CM | POA: Diagnosis not present

## 2023-01-11 DIAGNOSIS — R0609 Other forms of dyspnea: Secondary | ICD-10-CM | POA: Diagnosis not present

## 2023-01-11 DIAGNOSIS — Z01812 Encounter for preprocedural laboratory examination: Secondary | ICD-10-CM

## 2023-01-11 DIAGNOSIS — I2582 Chronic total occlusion of coronary artery: Secondary | ICD-10-CM | POA: Insufficient documentation

## 2023-01-11 DIAGNOSIS — E785 Hyperlipidemia, unspecified: Secondary | ICD-10-CM | POA: Diagnosis not present

## 2023-01-11 DIAGNOSIS — J449 Chronic obstructive pulmonary disease, unspecified: Secondary | ICD-10-CM | POA: Diagnosis not present

## 2023-01-11 DIAGNOSIS — Z86718 Personal history of other venous thrombosis and embolism: Secondary | ICD-10-CM | POA: Diagnosis not present

## 2023-01-11 DIAGNOSIS — F17210 Nicotine dependence, cigarettes, uncomplicated: Secondary | ICD-10-CM | POA: Diagnosis not present

## 2023-01-11 DIAGNOSIS — I110 Hypertensive heart disease with heart failure: Secondary | ICD-10-CM | POA: Insufficient documentation

## 2023-01-11 DIAGNOSIS — Z86711 Personal history of pulmonary embolism: Secondary | ICD-10-CM | POA: Insufficient documentation

## 2023-01-11 DIAGNOSIS — Z7901 Long term (current) use of anticoagulants: Secondary | ICD-10-CM | POA: Insufficient documentation

## 2023-01-11 DIAGNOSIS — B182 Chronic viral hepatitis C: Secondary | ICD-10-CM | POA: Insufficient documentation

## 2023-01-11 DIAGNOSIS — I5032 Chronic diastolic (congestive) heart failure: Secondary | ICD-10-CM | POA: Insufficient documentation

## 2023-01-11 DIAGNOSIS — I25118 Atherosclerotic heart disease of native coronary artery with other forms of angina pectoris: Secondary | ICD-10-CM | POA: Diagnosis not present

## 2023-01-11 HISTORY — PX: LEFT HEART CATH AND CORONARY ANGIOGRAPHY: CATH118249

## 2023-01-11 LAB — POTASSIUM: Potassium: 3.9 mmol/L (ref 3.5–5.1)

## 2023-01-11 SURGERY — LEFT HEART CATH AND CORONARY ANGIOGRAPHY
Anesthesia: LOCAL

## 2023-01-11 MED ORDER — SODIUM CHLORIDE 0.9% FLUSH: 3.0000 mL | INTRAVENOUS | Status: DC | PRN

## 2023-01-11 MED ORDER — MIDAZOLAM HCL 2 MG/2ML IJ SOLN
INTRAMUSCULAR | Status: DC | PRN
  Administered 2023-01-11: 2 mg via INTRAVENOUS

## 2023-01-11 MED ORDER — ACETAMINOPHEN 325 MG PO TABS: 650.0000 mg | ORAL_TABLET | ORAL | Status: DC | PRN

## 2023-01-11 MED ORDER — HEPARIN SODIUM (PORCINE) 1000 UNIT/ML IJ SOLN
INTRAMUSCULAR | Status: AC
  Filled 2023-01-11: qty 10

## 2023-01-11 MED ORDER — SODIUM CHLORIDE 0.9 % WEIGHT BASED INFUSION: 1.0000 mL/kg/h | INTRAVENOUS | Status: DC

## 2023-01-11 MED ORDER — VERAPAMIL HCL 2.5 MG/ML IV SOLN
INTRAVENOUS | Status: DC | PRN
  Administered 2023-01-11: 10 mL via INTRA_ARTERIAL

## 2023-01-11 MED ORDER — SODIUM CHLORIDE 0.9% FLUSH: 3.0000 mL | Freq: Two times a day (BID) | INTRAVENOUS | Status: DC

## 2023-01-11 MED ORDER — ONDANSETRON HCL 4 MG/2ML IJ SOLN: 4.0000 mg | Freq: Four times a day (QID) | INTRAMUSCULAR | Status: DC | PRN

## 2023-01-11 MED ORDER — LIDOCAINE HCL (PF) 1 % IJ SOLN
INTRAMUSCULAR | Status: AC
  Filled 2023-01-11: qty 30

## 2023-01-11 MED ORDER — SODIUM CHLORIDE 0.9 % WEIGHT BASED INFUSION: 3.0000 mL/kg/h | INTRAVENOUS | Status: DC

## 2023-01-11 MED ORDER — HYDRALAZINE HCL 20 MG/ML IJ SOLN: 10.0000 mg | INTRAMUSCULAR | Status: DC | PRN

## 2023-01-11 MED ORDER — SODIUM CHLORIDE 0.9 % WEIGHT BASED INFUSION
3.0000 mL/kg/h | INTRAVENOUS | Status: DC
  Administered 2023-01-11: 3 mL/kg/h via INTRAVENOUS

## 2023-01-11 MED ORDER — LIDOCAINE HCL (PF) 1 % IJ SOLN
INTRAMUSCULAR | Status: DC | PRN
  Administered 2023-01-11: 2 mL

## 2023-01-11 MED ORDER — HEPARIN SODIUM (PORCINE) 1000 UNIT/ML IJ SOLN
INTRAMUSCULAR | Status: DC | PRN
  Administered 2023-01-11: 4000 [IU] via INTRAVENOUS

## 2023-01-11 MED ORDER — VERAPAMIL HCL 2.5 MG/ML IV SOLN
INTRAVENOUS | Status: AC
  Filled 2023-01-11: qty 2

## 2023-01-11 MED ORDER — ASPIRIN 81 MG PO CHEW: 81.0000 mg | CHEWABLE_TABLET | ORAL | Status: DC

## 2023-01-11 MED ORDER — SODIUM CHLORIDE 0.9 % IV SOLN: 250.0000 mL | INTRAVENOUS | Status: DC | PRN

## 2023-01-11 MED ORDER — MIDAZOLAM HCL 2 MG/2ML IJ SOLN
INTRAMUSCULAR | Status: AC
  Filled 2023-01-11: qty 2

## 2023-01-11 MED ORDER — IOHEXOL 350 MG/ML SOLN
INTRAVENOUS | Status: DC | PRN
  Administered 2023-01-11: 50 mL

## 2023-01-11 MED ORDER — FENTANYL CITRATE (PF) 100 MCG/2ML IJ SOLN
INTRAMUSCULAR | Status: DC | PRN
  Administered 2023-01-11: 50 ug via INTRAVENOUS

## 2023-01-11 MED ORDER — LABETALOL HCL 5 MG/ML IV SOLN: 10.0000 mg | INTRAVENOUS | Status: DC | PRN

## 2023-01-11 MED ORDER — FENTANYL CITRATE (PF) 100 MCG/2ML IJ SOLN
INTRAMUSCULAR | Status: AC
  Filled 2023-01-11: qty 2

## 2023-01-11 MED ORDER — SODIUM CHLORIDE 0.9 % IV SOLN: INTRAVENOUS | Status: AC

## 2023-01-11 SURGICAL SUPPLY — 7 items
CATH 5FR JL3.5 JR4 ANG PIG MP (CATHETERS) IMPLANT
DEVICE RAD COMP TR BAND LRG (VASCULAR PRODUCTS) IMPLANT
GLIDESHEATH SLEND SS 6F .021 (SHEATH) IMPLANT
GUIDEWIRE INQWIRE 1.5J.035X260 (WIRE) IMPLANT
INQWIRE 1.5J .035X260CM (WIRE) ×1
PACK CARDIAC CATHETERIZATION (CUSTOM PROCEDURE TRAY) ×1 IMPLANT
SET ATX-X65L (MISCELLANEOUS) IMPLANT

## 2023-01-11 NOTE — Interval H&P Note (Signed)
History and Physical Interval Note:  01/11/2023 8:37 AM  [name]  has presented today for surgery, with the diagnosis of chest pain and abnormal stress test.  The various methods of treatment have been discussed with the patient and family. After consideration of risks, benefits and other options for treatment, the patient has consented to  Procedure(s): LEFT HEART CATH AND CORONARY ANGIOGRAPHY (N/A) as a surgical intervention.  The patient's history has been reviewed, patient examined, no change in status, stable for surgery.  I have reviewed the patient's chart and labs.  Questions were answered to the patient's satisfaction.    Cath Lab Visit (complete for each Cath Lab visit)  Clinical Evaluation Leading to the Procedure:   ACS: No.  Non-ACS:    Anginal Classification: CCS III  Anti-ischemic medical therapy: Maximal Therapy (2 or more classes of medications)  Non-Invasive Test Results: Intermediate-risk stress test findings: cardiac mortality 1-3%/year  Prior CABG: No previous CABG        Verne Carrow

## 2023-01-11 NOTE — Discharge Instructions (Signed)

## 2023-01-15 NOTE — Plan of Care (Signed)
[facility] Tonsillectomy/Adenoidectomy, Postoperative PEDS care plan entered in error.

## 2023-01-23 ENCOUNTER — Other Ambulatory Visit: Payer: Self-pay

## 2023-01-23 ENCOUNTER — Other Ambulatory Visit: Payer: Self-pay | Admitting: Internal Medicine

## 2023-01-23 DIAGNOSIS — I5032 Chronic diastolic (congestive) heart failure: Secondary | ICD-10-CM

## 2023-01-29 DIAGNOSIS — Z79899 Other long term (current) drug therapy: Secondary | ICD-10-CM | POA: Diagnosis not present

## 2023-02-01 ENCOUNTER — Encounter: Payer: Self-pay | Admitting: Physician Assistant

## 2023-02-01 ENCOUNTER — Ambulatory Visit: Payer: Medicare HMO | Attending: Physician Assistant | Admitting: Physician Assistant

## 2023-02-01 VITALS — BP 110/74 | HR 73 | Ht 74.0 in | Wt 176.0 lb

## 2023-02-01 DIAGNOSIS — I10 Essential (primary) hypertension: Secondary | ICD-10-CM

## 2023-02-01 DIAGNOSIS — J449 Chronic obstructive pulmonary disease, unspecified: Secondary | ICD-10-CM

## 2023-02-01 DIAGNOSIS — E785 Hyperlipidemia, unspecified: Secondary | ICD-10-CM | POA: Diagnosis not present

## 2023-02-01 DIAGNOSIS — I2510 Atherosclerotic heart disease of native coronary artery without angina pectoris: Secondary | ICD-10-CM

## 2023-02-01 DIAGNOSIS — Z86718 Personal history of other venous thrombosis and embolism: Secondary | ICD-10-CM | POA: Diagnosis not present

## 2023-02-01 NOTE — Patient Instructions (Signed)
Medication Instructions:  NO CHANGES *If you need a refill on your cardiac medications before your next appointment, please call your pharmacy*   Lab Work: NO LABS If you have labs (blood work) drawn today and your tests are completely normal, you will receive your results only by: MyChart Message (if you have MyChart) OR A paper copy in the mail If you have any lab test that is abnormal or we need to change your treatment, we will call you to review the results.   Testing/Procedures: NO TESTING   Follow-Up: At [facility], you and your health needs are our priority.  As part of our continuing mission to provide you with exceptional heart care, we have created designated Provider Care Teams.  These Care Teams include your primary Cardiologist (physician) and Advanced Practice Providers (APPs -  Physician Assistants and Nurse Practitioners) who all work together to provide you with the care you need, when you need it.  We recommend signing up for the patient portal called "MyChart".  Sign up information is provided on this After Visit Summary.  MyChart is used to connect with patients for Virtual Visits (Telemedicine).  Patients are able to view lab/test results, encounter notes, upcoming appointments, etc.  Non-urgent messages can be sent to your provider as well.   To learn more about what you can do with MyChart, go to [email or web address].    Your next appointment:   6 month(s)  Provider:   Parke Poisson, MD

## 2023-02-01 NOTE — Progress Notes (Signed)
Cardiology Office Note:  .   Date:  02/01/2023  ID:  [name], DOB [birth year], MRN [id number] PCP: Hoy Register, MD  [facility] Providers Cardiologist:  Parke Poisson, MD    History of Present Illness: .   [name] is a 62 y.o. male with PMH of CAD s/p BMS to D1 in 2015 (noted to be chronically occluded by cath in 12/2014 with 30% RCA, 20% left circumflex and a 50% proximal to mid LAD lesion), PE/DVT s/p IVC filter placement in 2012, COPD, chronic diastolic CHF, hypertension, hyperlipidemia, chronic hepatitis C, history of substance use and tobacco use.  Patient had lower extremity weakness with activity, ABI was unremarkable for obstructive disease.  He previously underwent lumbar spine fusion by Dr. Dutch Quint, there was sign of radiculopathy in the lumbar spine.  Patient also has dyspnea on exertion which has been chronic and endorse some chest tightness.  He has been started on Trelegy after completing pulmonary evaluation.  This has made no difference with his dyspnea on exertion.  He was seen by Dr. Jacques Navy on 12/04/2022 who recommended ischemic evaluation.  Subsequent Myoview obtained on 12/12/2022 showed small moderate fixed defect in the apical anterior segment consistent with infarction, medium moderate fixed defect in the mid to apical inferior/inferolateral segment consistent with infarction, finding consistent with prior infarction, there was no ischemia.  EF 50%.   I last saw the patient on 12/28/2022, after discussing medical management versus cardiac catheterization, he opted to proceed with cardiac catheterization for more definitive evaluation.  I also discussed the case with Dr. Jacques Navy who also was agreeable with cardiac catheterization.  He ultimately underwent cardiac catheterization on 01/11/2023 that showed 20% ostial to proximal left circumflex lesion, 30% proximal RCA lesion, 30% mid to distal RCA lesion, 100% ostial D1 occlusion, 50% proximal to mid LAD  lesion, 50% ramus lesion, the diagonal lesion has faint distal left to left collaterals.  Medical therapy was recommended.  Patient presents today for posthospital follow-up.  He denies any chest pain or shortness of breath.  He has no lower extremity edema, orthopnea or PND.  His last lipid panel was obtained in September of last year by his PCP.  He is due to return to see his PCP again.  I will defer to primary care provider to repeat blood work.  Over, he has been doing well from the cardiac perspective and can follow-up in 6 months.   ROS:   He denies chest pain, palpitations, dyspnea, pnd, orthopnea, n, v, dizziness, syncope, edema, weight gain, or early satiety. All other systems reviewed and are otherwise negative except as noted above.    Studies Reviewed: .        Cardiac Studies & Procedures   CARDIAC CATHETERIZATION  CARDIAC CATHETERIZATION 01/11/2023  Narrative   Ost Cx to Prox Cx lesion is 20% stenosed.   Prox RCA lesion is 30% stenosed.   Mid RCA to Dist RCA lesion is 30% stenosed.   Ost 1st Diag to 1st Diag lesion is 100% stenosed.   Prox LAD to Mid LAD lesion is 50% stenosed.   Ramus lesion is 50% stenosed.   1st Diag lesion is 100% stenosed.  Stable two vessel CAD Large caliber LAD that courses to the apex. The proximal to mid LAD has a long calcified segment with moderate, non-obstructive stenosis. Unchanged from last cath. The Diagonal branch is occluded within the old stent and fills faintly from left to left collaterals. This is unchanged  from last cath The moderate caliber intermediate branch has moderate, non-obstructive proximal stenosis The Circumflex is small to moderate in caliber and has minimal plaque The RCA is a large dominant vessel with mild proximal and mild distal stenosis  Recommendations: Continue medical management of CAD.  Findings Coronary Findings Diagnostic  Dominance: Right  Left Anterior Descending Vessel is large. Prox LAD to Mid  LAD lesion is 50% stenosed. The lesion is calcified.  First Diagonal Branch Collaterals 1st Diag filled by collaterals from 2nd Diag.  Ost 1st Diag to 1st Diag lesion is 100% stenosed. The lesion is chronically occluded. The lesion was previously treated between 1-2 years ago. 1st Diag lesion is 100% stenosed. The lesion is chronically occluded. The lesion was previously treated using a bare metal stent over 2 years ago.  Ramus Intermedius Vessel is moderate in size. Ramus lesion is 50% stenosed.  Left Circumflex Ost Cx to Prox Cx lesion is 20% stenosed. The lesion is discrete.  First Obtuse Marginal Branch Vessel is small in size.  Second Obtuse Marginal Branch Vessel is small in size.  Right Coronary Artery Vessel is large. Prox RCA lesion is 30% stenosed. The lesion is discrete. Mid RCA to Dist RCA lesion is 30% stenosed. The lesion is segmental.  Intervention  No interventions have been documented.   CARDIAC CATHETERIZATION  CARDIAC CATHETERIZATION 01/04/2015  Narrative  Prox RCA lesion, 30% stenosed.  Mid RCA to Dist RCA lesion, 30% stenosed.  Ost Cx to Prox Cx lesion, 20% stenosed.  Ramus lesion, 60% stenosed.  Prox LAD to Mid LAD lesion, 50% stenosed.  Ost 1st Diag to 1st Diag lesion, 100% stenosed. The lesion was previously treated with a bare metal stentone to two years ago.  The left ventricular systolic function is normal.  1. Double vessel CAD 2. Moderate stenosis moderate caliber Ramus Intermediate branch, does not appear to be flow limiting. 3. Mild to moderate disease in the LAD, RCA and Circumflex. 4. Chronic total occlusion bare metal stent in Diagonal branch. 5. Low normal LV systolic function  Recommendations: Continue medical management of CAD. He admits today to continued tobacco abuse and medication non-compliance. He responded poorly to bare metal stenting in the past. I would maximize his anti-anginal medications and encourage tobacco  cessation. He can be discharged home later today. Follow up with Dr. Marca Ancona following discharge.  Findings Coronary Findings Diagnostic  Dominance: Right  Left Anterior Descending discrete.  First Diagonal Branch chronic total occlusion. The lesion was previously treated with a bare metal stentone to two years ago.  Ramus Intermedius The vessel is moderate in size. discrete.  Left Circumflex The vessel is moderate in size. discrete.  First Obtuse Marginal Branch The vessel is small in size.  Second Obtuse Marginal Branch The vessel is small in size.  Right Coronary Artery discrete. diffuse.  Intervention  No interventions have been documented.   STRESS TESTS  MYOCARDIAL PERFUSION IMAGING 12/12/2022  Narrative   Small, moderate, fixed defect in the apical anterior segment consistent with infarction. Medium, moderate, fixed defect in the mid to apical inferior/inferolateral segments consistent with infarction. Findings are consistent with prior infarction, multi-vessel. No ischemia. Compared to the prior study, the inferior/inferolateral infarction is new.   LV perfusion is abnormal. There is no evidence of ischemia. There is evidence of infarction. Defect 1: There is a small defect with moderate reduction in uptake present in the apical anterior location(s) that is fixed. There is abnormal wall motion in the defect area.  Consistent with infarction. Defect 2: There is a medium defect with moderate reduction in uptake present in the apical to mid inferior and inferolateral location(s) that is fixed. There is abnormal wall motion in the defect area. Consistent with infarction.   Findings are consistent with infarction. The study is intermediate risk.   Left ventricular function is normal. Nuclear stress EF: 50%. The left ventricular ejection fraction is mildly decreased (45-54%). End diastolic cavity size is normal.   ECHOCARDIOGRAM  ECHOCARDIOGRAM COMPLETE  06/28/2022  Narrative ECHOCARDIOGRAM REPORT    Patient Name:   [name] Date of Exam: 06/28/2022 Medical Rec #:  [id number]         Height:       74.0 in Accession #:    [id number]        Weight:       182.0 lb Date of Birth:  December 03, 1960         BSA:          2.088 m Patient Age:    62 years          BP:           115/66 mmHg Patient Gender: M                 HR:           77 bpm. Exam Location:  Outpatient  Procedure: 2D Echo, Color Doppler and Cardiac Doppler  Indications:    CHF  History:        Patient has prior history of Echocardiogram examinations. CHF, CAD, COPD, Signs/Symptoms:Shortness of Breath; Risk Factors:Hypertension, Current Smoker and Cocaine abuse.  Sonographer:    Milbert Coulter Referring Phys: 34 ENOBONG NEWLIN  IMPRESSIONS   1. Limited study due to poor echo windows. 2. Left ventricular ejection fraction, by estimation, is 40 to 45%. The left ventricle has mildly decreased function. Left ventricular endocardial border not optimally defined to evaluate regional wall motion. There is mild concentric left ventricular hypertrophy. Left ventricular diastolic parameters are consistent with Grade I diastolic dysfunction (impaired relaxation). 3. Right ventricular systolic function is normal. The right ventricular size is normal. Tricuspid regurgitation signal is inadequate for assessing PA pressure. 4. The mitral valve is grossly normal. No evidence of mitral valve regurgitation. No evidence of mitral stenosis. 5. The aortic valve is tricuspid. There is mild calcification of the aortic valve. There is mild thickening of the aortic valve. Aortic valve regurgitation is trivial. Aortic valve sclerosis/calcification is present, without any evidence of aortic stenosis. 6. The inferior vena cava is normal in size with greater than 50% respiratory variability, suggesting right atrial pressure of 3 mmHg. 7. Aortic dilatation noted. There is borderline dilatation of the  aortic root, measuring 39 mm.  Comparison(s): No significant change from prior study.  FINDINGS Left Ventricle: Left ventricular ejection fraction, by estimation, is 40 to 45%. The left ventricle has mildly decreased function. Left ventricular endocardial border not optimally defined to evaluate regional wall motion. The left ventricular internal cavity size was normal in size. There is mild concentric left ventricular hypertrophy. Left ventricular diastolic parameters are consistent with Grade I diastolic dysfunction (impaired relaxation).  Right Ventricle: The right ventricular size is normal. No increase in right ventricular wall thickness. Right ventricular systolic function is normal. Tricuspid regurgitation signal is inadequate for assessing PA pressure.  Left Atrium: Left atrial size was normal in size.  Right Atrium: Right atrial size was normal in size.  Pericardium: There is no evidence  of pericardial effusion.  Mitral Valve: The mitral valve is grossly normal. There is mild thickening of the mitral valve leaflet(s). There is mild calcification of the mitral valve leaflet(s). No evidence of mitral valve regurgitation. No evidence of mitral valve stenosis.  Tricuspid Valve: The tricuspid valve is normal in structure. Tricuspid valve regurgitation is trivial.  Aortic Valve: The aortic valve is tricuspid. There is mild calcification of the aortic valve. There is mild thickening of the aortic valve. Aortic valve regurgitation is trivial. Aortic valve sclerosis/calcification is present, without any evidence of aortic stenosis. Aortic valve mean gradient measures 1.0 mmHg. Aortic valve peak gradient measures 2.5 mmHg. Aortic valve area, by VTI measures 4.50 cm.  Pulmonic Valve: The pulmonic valve was not well visualized. Pulmonic valve regurgitation is trivial.  Aorta: Aortic dilatation noted. There is borderline dilatation of the aortic root, measuring 39 mm.  Venous: The inferior  vena cava is normal in size with greater than 50% respiratory variability, suggesting right atrial pressure of 3 mmHg.  IAS/Shunts: The atrial septum is grossly normal.   LEFT VENTRICLE PLAX 2D LVIDd:         4.70 cm     Diastology LVIDs:         4.20 cm     LV e' medial:    5.87 cm/s LV PW:         0.90 cm     LV E/e' medial:  6.1 LV IVS:        1.00 cm     LV e' lateral:   7.51 cm/s LVOT diam:     2.50 cm     LV E/e' lateral: 4.8 LV SV:         64 LV SV Index:   31 LVOT Area:     4.91 cm  LV Volumes (MOD) LV vol d, MOD A2C: 58.4 ml LV vol d, MOD A4C: 78.7 ml LV vol s, MOD A2C: 33.8 ml LV vol s, MOD A4C: 47.6 ml LV SV MOD A2C:     24.6 ml LV SV MOD A4C:     78.7 ml LV SV MOD BP:      29.1 ml  RIGHT VENTRICLE RV Basal diam:  3.10 cm RV Mid diam:    2.50 cm RV S prime:     8.59 cm/s TAPSE (M-mode): 1.9 cm  LEFT ATRIUM             Index LA Vol (A2C):   43.2 ml 20.69 ml/m LA Vol (A4C):   33.2 ml 15.90 ml/m LA Biplane Vol: 39.5 ml 18.92 ml/m AORTIC VALVE AV Area (Vmax):    4.68 cm AV Area (Vmean):   4.34 cm AV Area (VTI):     4.50 cm AV Vmax:           79.50 cm/s AV Vmean:          56.200 cm/s AV VTI:            0.143 m AV Peak Grad:      2.5 mmHg AV Mean Grad:      1.0 mmHg LVOT Vmax:         75.80 cm/s LVOT Vmean:        49.700 cm/s LVOT VTI:          0.131 m LVOT/AV VTI ratio: 0.92  MITRAL VALVE MV Area (PHT): 2.93 cm    SHUNTS MV Decel Time: 259 msec    Systemic VTI:  0.13 m MV E  velocity: 36.00 cm/s  Systemic Diam: 2.50 cm MV A velocity: 62.60 cm/s MV E/A ratio:  0.58  Laurance Flatten MD Electronically signed by Laurance Flatten MD Signature Date/Time: 06/28/2022/9:31:09 AM    Final             Risk Assessment/Calculations:             Physical Exam:   VS:  BP 110/74 (BP Location: Left Arm, Patient Position: Sitting, Cuff Size: Normal)   Pulse 73   Ht 6\' 2"  (1.88 m)   Wt 176 lb (79.8 kg)   BMI 22.60 kg/m    Wt Readings from Last 3  Encounters:  02/01/23 176 lb (79.8 kg)  01/11/23 177 lb (80.3 kg)  12/28/22 173 lb (78.5 kg)    GEN: Well nourished, well developed in no acute distress NECK: No JVD; No carotid bruits CARDIAC: RRR, no murmurs, rubs, gallops RESPIRATORY:  Clear to auscultation without rales, wheezing or rhonchi  ABDOMEN: Soft, non-tender, non-distended EXTREMITIES:  No edema; No deformity   ASSESSMENT AND PLAN: .    Coronary Artery Disease History of occluded D1 artery since 2015, stable since 2016. Recent cardiac catheterization on 01/11/2023 showed 20% proximal left circumflex lesion, 30% proximal RCA lesion, 30% mid to distal RCA lesion, 100% occluded D1, 50% proximal to mid LAD lesion, and 50% ramus lesion.  -Continue current medical management with Eliquis, Jardiance, Praluent/Repatha, Imdur, Losartan, and Metoprolol. -No medication adjustments at this time.  History of DVT: Resumed Eliquis  COPD: No acute exacerbation  Hypertension: Blood pressure well-controlled on current therapy  Hyperlipidemia Last cholesterol check in September 2024 by primary care physician. -Recommend annual cholesterol check by primary care physician.        Dispo: Follow-up with Dr. Jacques Navy in 6 month  Signed, Azalee Course, [place]

## 2023-02-02 ENCOUNTER — Ambulatory Visit: Payer: Medicare HMO | Admitting: Podiatry

## 2023-02-13 ENCOUNTER — Other Ambulatory Visit (HOSPITAL_COMMUNITY): Payer: Self-pay

## 2023-02-13 DIAGNOSIS — Z79899 Other long term (current) drug therapy: Secondary | ICD-10-CM | POA: Diagnosis not present

## 2023-02-14 ENCOUNTER — Other Ambulatory Visit (HOSPITAL_COMMUNITY): Payer: Self-pay

## 2023-02-21 ENCOUNTER — Other Ambulatory Visit (HOSPITAL_BASED_OUTPATIENT_CLINIC_OR_DEPARTMENT_OTHER): Payer: Medicare HMO | Admitting: Pharmacist

## 2023-02-21 ENCOUNTER — Encounter: Payer: Self-pay | Admitting: Pharmacist

## 2023-02-21 DIAGNOSIS — E119 Type 2 diabetes mellitus without complications: Secondary | ICD-10-CM

## 2023-02-21 DIAGNOSIS — I10 Essential (primary) hypertension: Secondary | ICD-10-CM

## 2023-02-21 DIAGNOSIS — Z7984 Long term (current) use of oral hypoglycemic drugs: Secondary | ICD-10-CM

## 2023-02-21 NOTE — Progress Notes (Signed)
Pharmacy Quality Measure Review  This patient is appearing on a report for being at risk of failing the adherence measure for diabetes and hypertension (ACEi/ARB) medications this calendar year.   Medication: empagliflozin  Last fill date: 01/02/23 for 90 day supply. Was filled before abs fail date of 02/05/2023.  Insurance report was not up to date. No action needed at this time.   Medication: losartan Last fill date: 01/02/2023 for 90 day supply. Was filled before absolute fail date of 02/04/2023.  Insurance report was not up to date. No action needed at this time.   Butch Penny, PharmD, Patsy [place], CPP Clinical Pharmacist [facility] [phone]

## 2023-02-28 DIAGNOSIS — Z79899 Other long term (current) drug therapy: Secondary | ICD-10-CM | POA: Diagnosis not present

## 2023-03-08 ENCOUNTER — Other Ambulatory Visit: Payer: Self-pay | Admitting: Family Medicine

## 2023-03-08 ENCOUNTER — Other Ambulatory Visit: Payer: Self-pay | Admitting: Internal Medicine

## 2023-03-08 ENCOUNTER — Other Ambulatory Visit (HOSPITAL_COMMUNITY): Payer: Self-pay

## 2023-03-08 ENCOUNTER — Other Ambulatory Visit: Payer: Self-pay

## 2023-03-08 DIAGNOSIS — I5032 Chronic diastolic (congestive) heart failure: Secondary | ICD-10-CM

## 2023-03-08 DIAGNOSIS — Z86711 Personal history of pulmonary embolism: Secondary | ICD-10-CM

## 2023-03-08 MED ORDER — PANTOPRAZOLE SODIUM 40 MG PO TBEC
40.0000 mg | DELAYED_RELEASE_TABLET | Freq: Every day | ORAL | 6 refills | Status: DC
  Filled 2023-03-08 (×2): qty 30, 30d supply, fill #0

## 2023-03-08 NOTE — Telephone Encounter (Signed)
Requested Prescriptions  Pending Prescriptions Disp Refills   pantoprazole (PROTONIX) 40 MG tablet 30 tablet 6    Sig: Take 1 tablet (40 mg total) by mouth daily.     Gastroenterology: Proton Pump Inhibitors Passed - 03/08/2023 12:49 PM      Passed - Valid encounter within last 12 months    Recent Outpatient Visits           5 months ago Parotid tumor   [facility] [place]. [facility], Marylene Land M, [place]   9 months ago Smoking greater than 20 pack years   [facility] [place]. [facility] Hoy Register, [place]   1 year ago Other emphysema [facility])   [facility] Of [place]. [facility] Hoy Register, MD   1 year ago Other emphysema [facility])   [facility] [place] [facility] Of [place]. [facility] Hoy Register, MD   2 years ago Anorexia   [facility] Of [place]. [facility], [place], [place]

## 2023-03-12 ENCOUNTER — Other Ambulatory Visit: Payer: Self-pay

## 2023-03-14 DIAGNOSIS — Z79899 Other long term (current) drug therapy: Secondary | ICD-10-CM | POA: Diagnosis not present

## 2023-03-14 NOTE — H&P (Signed)
HPI:   [name] is a 62 y.o. male who presents as a consult Patient.   Referring Provider: Reeves Forth, MD  Chief complaint: Eye swelling.  HPI: His right eye has been swollen for a couple of weeks. He had this once before with both eyes and he took prednisone. He does not recall any more details. He complains of nasal congestion and nasal discharge. He has intermittent nasal obstruction. He has not been to an eye doctor.  PMH/Meds/All/SocHx/FamHx/ROS:   History reviewed. No pertinent past medical history.  History reviewed. No pertinent surgical history.  No family history of bleeding disorders, wound healing problems or difficulty with anesthesia.     Current Outpatient Medications:  albuterol HFA (PROVENTIL HFA;VENTOLIN HFA;PROAIR HFA) 90 mcg/actuation inhaler, Inhale 2 puffs every 6 (six) hours as needed., Disp: , Rfl:  apixaban (ELIQUIS) 5 mg tab, Take by mouth., Disp: , Rfl:  azelastine-fluticasone (DYMISTA) 137-50 mcg/spray spry nasal spray, Administer 2 sprays into affected nostril(s)., Disp: , Rfl:  cetirizine (ZyrTEC) 10 mg tablet, Take 10 mg by mouth daily., Disp: , Rfl:  empagliflozin (JARDIANCE) 10 mg tab, Take 10 mg by mouth., Disp: , Rfl:  evolocumab 140 mg/mL pnij, INJECT 140 MG INTO THE SKIN EVERY 14 (FOURTEEN) DAYS., Disp: , Rfl:  ezetimibe (ZETIA) 10 mg tablet, Take 10 mg by mouth daily., Disp: , Rfl:  furosemide (LASIX) 40 mg tablet, Take 40 mg by mouth., Disp: , Rfl:  gabapentin (NEURONTIN) 100 mg capsule, Take 100 mg by mouth., Disp: , Rfl:  isosorbide mononitrate (IMDUR) 30 mg 24 hr tablet, Take 30 mg by mouth daily., Disp: , Rfl:  losartan (COZAAR) 25 mg tablet, Take 25 mg by mouth daily., Disp: , Rfl:  montelukast (SINGULAIR) 10 mg tablet, Take 1 tablet by mouth nightly., Disp: , Rfl:  nitroglycerin (NITROSTAT) 0.4 mg SL tablet, Place 0.4 mg under the tongue every 5 (five) minutes as needed., Disp: , Rfl:  pantoprazole (PROTONIX) 40 mg EC  tablet, Take 40 mg by mouth daily., Disp: , Rfl:  torsemide 40 mg tab, Take 1 tablet by mouth daily., Disp: , Rfl:  Trelegy Ellipta 100-62.5-25 mcg inhaler, Inhale 1 puff daily., Disp: , Rfl:   A complete ROS was performed with pertinent positives/negatives noted in the HPI. The remainder of the ROS are negative.   Physical Exam:   Temp 96.9 F (36.1 C) (Temporal)  Ht 1.854 m (6\' 1" )  Wt 79.5 kg (175 lb 3.2 oz)  BMI 23.11 kg/m   General: Healthy and alert, in no distress, breathing easily. Normal affect. In a pleasant mood. Head: Normocephalic, atraumatic. No masses, or scars. Eyes: Right lateral upper eyelid with edema and swelling of the lacrimal gland. Pupils are equal, and reactive to light. Vision is grossly intact. No spontaneous or gaze nystagmus. Ears: Ear canals are clear. Tympanic membranes are intact, with normal landmarks and the middle ears are clear and healthy. Hearing: Grossly normal. Nose: Nasal cavities are clear with healthy mucosa, no polyps or exudate. Airways are patent. Face: There is a 2 cm soft mobile left parotid mass. No other masses or scars, facial nerve function is symmetric. Oral Cavity: No mucosal abnormalities are noted. Tongue with normal mobility. Dentition appears healthy. Oropharynx: Tonsils are symmetric. There are no mucosal masses identified. Tongue base appears normal and healthy. Larynx/Hypopharynx: deferred Chest: Deferred Neck: No palpable masses, no cervical adenopathy, no thyroid nodules or enlargement. Neuro: Cranial nerves II-XII with normal function. Balance: Normal gate. Other findings: none.  Independent  Review of Additional Tests or Records:  none  Procedures:  none  Impression & Plans:  Right orbital swelling. Need to rule out a sinus cause of this with imaging of the sinuses. Will discuss results when available.  Left parotid mass. Recommend parotidectomy with facial nerve dissection.

## 2023-03-15 ENCOUNTER — Other Ambulatory Visit: Payer: Self-pay

## 2023-03-16 ENCOUNTER — Other Ambulatory Visit: Payer: Self-pay

## 2023-03-16 ENCOUNTER — Encounter (HOSPITAL_COMMUNITY): Payer: Self-pay | Admitting: Otolaryngology

## 2023-03-16 NOTE — Progress Notes (Signed)
PCP - Hoy Register, MD  Cardiologist - Parke Poisson, MD   PPM/ICD - denies Device Orders - n/a Rep Notified - n/a  Chest x-ray - 09-13-22 Chest CT 01-12-23 EKG - 12-28-22 Stress Test - 12-12-22 ECHO - 06-28-22 Cardiac Cath - 01-11-23  CPAP - denies   DM denies  Blood Thinner Instructions: apixaban (ELIQUIS) Last dose 03-14-23 Aspirin Instructions: n/a  ERAS Protcol - NPO  COVID TEST- no  Anesthesia review: YES, Htn, MI, CHF, COPD, IV filter   Patient verbally denies any shortness of breath, fever, cough and chest pain during phone call   -------------  SDW INSTRUCTIONS given:  Your procedure is scheduled on March 19, 2023.  Report to [facility] Main Entrance "A" at 1:00 P.M., and check in at the Admitting office.  Call this number if you have problems the morning of surgery:  [phone]   Remember:  Do not eat  or drink after midnight the night before your surgery      Take these medicines the morning of surgery with A SIP OF WATER  ezetimibe (ZETIA)  Fluticasone-Umeclidin-Vilant (TRELEGY ELLIPTA) MCG/ACT AEPB    IF needed albuterol (PROAIR HFA)  inhaler  albuterol (PROVENTIL)nebulizer solution  nitroGLYCERIN (NITROSTAT) Please call [phone] if needed to use day before surgery pantoprazole (PROTONIX)  Do not take empagliflozin (JARDIANCE)  the morning of surgery. LAST DOSE ":03-14-23  As of today, STOP taking any Aspirin (unless otherwise instructed by your surgeon) Aleve, Naproxen, Ibuprofen, Motrin, Advil, Goody's, BC's, all herbal medications, fish oil, and all vitamins.              Do not wear jewelry, make up, or nail polish            Do not wear lotions, powders, perfumes/colognes, or deodorant.            Do not shave 48 hours prior to surgery.  Men may shave face and neck.            Do not bring valuables to the hospital.            [facility] is not responsible for any belongings or valuables.  Do NOT Smoke (Tobacco/Vaping)  24 hours prior to your procedure If you use a CPAP at night, you may bring all equipment for your overnight stay.   Contacts, glasses, dentures or bridgework may not be worn into surgery.      For patients admitted to the hospital, discharge time will be determined by your treatment team.   Patients discharged the day of surgery will not be allowed to drive home, and someone needs to stay with them for 24 hours.    Special instructions:   [place]- Preparing For Surgery  Before surgery, you can play an important role. Because skin is not sterile, your skin needs to be as free of germs as possible. You can reduce the number of germs on your skin by washing with CHG (chlorahexidine gluconate) Soap before surgery.  CHG is an antiseptic cleaner which kills germs and bonds with the skin to continue killing germs even after washing.    Oral Hygiene is also important to reduce your risk of infection.  Remember - BRUSH YOUR TEETH THE MORNING OF SURGERY WITH YOUR REGULAR TOOTHPASTE  Please do not use if you have an allergy to CHG or antibacterial soaps. If your skin becomes reddened/irritated stop using the CHG.  Do not shave (including legs and underarms) for at least 48 hours  prior to first CHG shower. It is OK to shave your face.  Please follow these instructions carefully.   Shower the NIGHT BEFORE SURGERY and the MORNING OF SURGERY with DIAL Soap.   Pat yourself dry with a CLEAN TOWEL.  Wear CLEAN PAJAMAS to bed the night before surgery  Place CLEAN SHEETS on your bed the night of your first shower and DO NOT SLEEP WITH PETS.   Day of Surgery: Please shower morning of surgery  Wear Clean/Comfortable clothing the morning of surgery Do not apply any deodorants/lotions.   Remember to brush your teeth WITH YOUR REGULAR TOOTHPASTE.   Questions were answered. Patient verbalized understanding of instructions.

## 2023-03-16 NOTE — Anesthesia Preprocedure Evaluation (Addendum)
Anesthesia Evaluation  Patient identified by MRN, date of birth, ID band Patient awake    Reviewed: Allergy & Precautions, NPO status , Patient's Chart, lab work & pertinent test results, reviewed documented beta blocker date and time   Airway Mallampati: II  TM Distance: >3 FB Neck ROM: Full    Dental  (+) Dental Advisory Given, Lower Dentures, Upper Dentures   Pulmonary shortness of breath and with exertion, asthma , pneumonia, resolved, COPD,  COPD inhaler, Current Smoker and Patient abstained from smoking., PE Hx/o PTE 2012   Pulmonary exam normal breath sounds clear to auscultation       Cardiovascular hypertension, Pt. on medications and Pt. on home beta blockers + angina with exertion + CAD, + Past MI, +CHF and + DVT  Normal cardiovascular exam Rhythm:Regular Rate:Normal  EKG 08/13/20 NSR, LVH   Echo 07/08/19 1. Left ventricular ejection fraction, by estimation, is 40 to 45%. The left ventricle has mild to moderately decreased function. The left ventricle demonstrates global hypokinesis. There is mild left ventricular hypertrophy. Left ventricular diastolic parameters are consistent with Grade I diastolic dysfunction (impaired relaxation). There is abnormal septal motion.  2. Right ventricular systolic function is normal. The right ventricular size is normal.  3. The mitral valve is normal in structure. Trivial mitral valve regurgitation. No evidence of mitral stenosis.  4. The aortic valve is tricuspid. Aortic valve regurgitation is not visualized. No aortic stenosis is present.  5. Aortic dilatation noted. There is mild dilatation of the aortic root measuring 40 mm.   Hx/o IVC filter 2012   Neuro/Psych  Headaches  Neuromuscular disease  negative psych ROS   GI/Hepatic ,GERD  Medicated,,(+)     substance abuse  , Hepatitis -, CTreated Hepatitis C   Endo/Other  Hyperlipidemia  Renal/GU negative Renal ROS  negative  genitourinary   Musculoskeletal  (+) Arthritis , Osteoarthritis,  Lumbar stenosis L5-S1 right radiculopathy and numbness right foot   Abdominal   Peds  Hematology  (+) Blood dyscrasia, anemia Eliquis therapy- last dose 8/5   Anesthesia Other Findings   Reproductive/Obstetrics                             Anesthesia Physical Anesthesia Plan  ASA: 3  Anesthesia Plan: General   Post-op Pain Management: Minimal or no pain anticipated, Tylenol PO (pre-op)* and Celebrex PO (pre-op)*   Induction: Intravenous  PONV Risk Score and Plan: 2 and Treatment may vary due to age or medical condition, Ondansetron and Dexamethasone  Airway Management Planned: Oral ETT  Additional Equipment: None  Intra-op Plan:   Post-operative Plan: Extubation in OR  Informed Consent: I have reviewed the patients History and Physical, chart, labs and discussed the procedure including the risks, benefits and alternatives for the proposed anesthesia with the patient or authorized representative who has indicated his/her understanding and acceptance.     Dental advisory given  Plan Discussed with: CRNA and Anesthesiologist  Anesthesia Plan Comments: (PAT note by Antionette Poles, PA-C: 62 year old male with history including CAD s/p BMS to D1 in 2015 (noted to be chronically occluded by cath in 12/2014 with 30% RCA, 20% left circumflex and a 50% proximal to mid LAD lesion), PE/DVT s/p IVC filter placement in 2012, COPD, chronic diastolic CHF, hypertension, hyperlipidemia, chronic hepatitis C (completed Harvoni 2017), history of substance use and tobacco use.   Patient underwent recent evaluation by cardiology for complaint of DOE and chest tightness.  Myoview 12/12/2022 showed small moderate fixed defect in the apical anterior segment consistent with infarction, medium moderate fixed defect in the mid to apical inferior/inferolateral segment consistent with infarction, finding consistent  with prior infarction, there was no ischemia. EF 50%.  He ultimately underwent cardiac catheterization on 01/11/2023 showing 20% ostial to proximal left circumflex lesion, 30% proximal RCA lesion, 30% mid to distal RCA lesion, 100% ostial D1 occlusion, 50% proximal to mid LAD lesion, 50% ramus lesion, the diagonal lesion has faint distal left to left collaterals. Medical therapy was recommended.  He had follow-up with Azalee Course, PA on 02/01/2023.  He was reportedly doing well at that time from cardiac standpoint.  He was continued on Eliquis, Jardiance, Praluent/Repatha, Imdur, losartan, and metoprolol.  No medication changes were made at that time.  64-month follow-up was recommended.  Patient reports last dose Eliquis 03/14/2023.  Patient will need day of surgery labs and evaluation.  EKG 12/28/2022: Sinus rhythm with first-degree AV block.  Rate 67.  Prolonged QT (QTcB 509).  No significant change was found.  Cath 01/11/2023:    Ost Cx to Prox Cx lesion is 20% stenosed.   Prox RCA lesion is 30% stenosed.   Mid RCA to Dist RCA lesion is 30% stenosed.   Ost 1st Diag to 1st Diag lesion is 100% stenosed.   Prox LAD to Mid LAD lesion is 50% stenosed.   Ramus lesion is 50% stenosed.   1st Diag lesion is 100% stenosed.  Stable two vessel CAD Large caliber LAD that courses to the apex. The proximal to mid LAD has a long calcified segment with moderate, non-obstructive stenosis. Unchanged from last cath.  The Diagonal branch is occluded within the old stent and fills faintly from left to left collaterals. This is unchanged from last cath The moderate caliber intermediate branch has moderate, non-obstructive proximal stenosis The Circumflex is small to moderate in caliber and has minimal plaque The RCA is a large dominant vessel with mild proximal and mild distal stenosis  Recommendations: Continue medical management of CAD.   TTE 06/28/2022: 1. Limited study due to poor echo windows.  2.  Left ventricular ejection fraction, by estimation, is 40 to 45%. The  left ventricle has mildly decreased function. Left ventricular endocardial  border not optimally defined to evaluate regional wall motion. There is  mild concentric left ventricular  hypertrophy. Left ventricular diastolic parameters are consistent with  Grade I diastolic dysfunction (impaired relaxation).  3. Right ventricular systolic function is normal. The right ventricular  size is normal. Tricuspid regurgitation signal is inadequate for assessing  PA pressure.  4. The mitral valve is grossly normal. No evidence of mitral valve  regurgitation. No evidence of mitral stenosis.  5. The aortic valve is tricuspid. There is mild calcification of the  aortic valve. There is mild thickening of the aortic valve. Aortic valve  regurgitation is trivial. Aortic valve sclerosis/calcification is present,  without any evidence of aortic  stenosis.  6. The inferior vena cava is normal in size with greater than 50%  respiratory variability, suggesting right atrial pressure of 3 mmHg.  7. Aortic dilatation noted. There is borderline dilatation of the aortic  root, measuring 39 mm.   Comparison(s): No significant change from prior study.   )        Anesthesia Quick Evaluation

## 2023-03-16 NOTE — Progress Notes (Addendum)
Anesthesia Chart Review: Same-day workup  62 year old male with history including CAD s/p BMS to D1 in 2015 (noted to be chronically occluded by cath in 12/2014 with 30% RCA, 20% left circumflex and a 50% proximal to mid LAD lesion), PE/DVT s/p IVC filter placement in 2012, COPD, chronic diastolic CHF, hypertension, hyperlipidemia, chronic hepatitis C (completed Harvoni 2017), history of substance use and tobacco use.   Patient underwent recent evaluation by cardiology for complaint of DOE and chest tightness.  Myoview 12/12/2022 showed small moderate fixed defect in the apical anterior segment consistent with infarction, medium moderate fixed defect in the mid to apical inferior/inferolateral segment consistent with infarction, finding consistent with prior infarction, there was no ischemia. EF 50%.  He ultimately underwent cardiac catheterization on 01/11/2023 showing 20% ostial to proximal left circumflex lesion, 30% proximal RCA lesion, 30% mid to distal RCA lesion, 100% ostial D1 occlusion, 50% proximal to mid LAD lesion, 50% ramus lesion, the diagonal lesion has faint distal left to left collaterals. Medical therapy was recommended.  He had follow-up with Azalee Course, PA on 02/01/2023.  He was reportedly doing well at that time from cardiac standpoint.  He was continued on Eliquis, Jardiance, Praluent/Repatha, Imdur, losartan, and metoprolol.  No medication changes were made at that time.  26-month follow-up was recommended.  Patient reports last dose Eliquis 03/14/2023.  Patient will need day of surgery labs and evaluation.  EKG 12/28/2022: Sinus rhythm with first-degree AV block.  Rate 67.  Prolonged QT (QTcB 509).  No significant change was found.  Cath 01/11/2023:    Ost Cx to Prox Cx lesion is 20% stenosed.   Prox RCA lesion is 30% stenosed.   Mid RCA to Dist RCA lesion is 30% stenosed.   Ost 1st Diag to 1st Diag lesion is 100% stenosed.   Prox LAD to Mid LAD lesion is 50% stenosed.   Ramus  lesion is 50% stenosed.   1st Diag lesion is 100% stenosed.   Stable two vessel CAD Large caliber LAD that courses to the apex. The proximal to mid LAD has a long calcified segment with moderate, non-obstructive stenosis. Unchanged from last cath.  The Diagonal branch is occluded within the old stent and fills faintly from left to left collaterals. This is unchanged from last cath The moderate caliber intermediate branch has moderate, non-obstructive proximal stenosis The Circumflex is small to moderate in caliber and has minimal plaque The RCA is a large dominant vessel with mild proximal and mild distal stenosis   Recommendations: Continue medical management of CAD.   TTE 06/28/2022:  1. Limited study due to poor echo windows.   2. Left ventricular ejection fraction, by estimation, is 40 to 45%. The  left ventricle has mildly decreased function. Left ventricular endocardial  border not optimally defined to evaluate regional wall motion. There is  mild concentric left ventricular  hypertrophy. Left ventricular diastolic parameters are consistent with  Grade I diastolic dysfunction (impaired relaxation).   3. Right ventricular systolic function is normal. The right ventricular  size is normal. Tricuspid regurgitation signal is inadequate for assessing  PA pressure.   4. The mitral valve is grossly normal. No evidence of mitral valve  regurgitation. No evidence of mitral stenosis.   5. The aortic valve is tricuspid. There is mild calcification of the  aortic valve. There is mild thickening of the aortic valve. Aortic valve  regurgitation is trivial. Aortic valve sclerosis/calcification is present,  without any evidence of aortic  stenosis.  6. The inferior vena cava is normal in size with greater than 50%  respiratory variability, suggesting right atrial pressure of 3 mmHg.   7. Aortic dilatation noted. There is borderline dilatation of the aortic  root, measuring 39 mm.    Comparison(s): No significant change from prior study.     Zannie Cove [facility] Short Stay Center/Anesthesiology Phone [phone] 03/16/2023 11:28 AM

## 2023-03-19 ENCOUNTER — Other Ambulatory Visit: Payer: Self-pay

## 2023-03-19 ENCOUNTER — Ambulatory Visit (HOSPITAL_COMMUNITY): Payer: Medicare HMO | Admitting: Physician Assistant

## 2023-03-19 ENCOUNTER — Encounter (HOSPITAL_COMMUNITY): Payer: Self-pay | Admitting: Otolaryngology

## 2023-03-19 ENCOUNTER — Observation Stay (HOSPITAL_COMMUNITY)
Admission: RE | Admit: 2023-03-19 | Discharge: 2023-03-21 | Disposition: A | Discharge status: Home / Self Care | Payer: Medicare HMO | Attending: Otolaryngology | Admitting: Otolaryngology

## 2023-03-19 ENCOUNTER — Encounter (HOSPITAL_COMMUNITY)
Admission: RE | Disposition: A | Discharge status: Home / Self Care | Payer: Self-pay | Source: Home / Self Care | Attending: Otolaryngology

## 2023-03-19 ENCOUNTER — Ambulatory Visit (HOSPITAL_COMMUNITY): Payer: Self-pay | Admitting: Physician Assistant

## 2023-03-19 DIAGNOSIS — Z7984 Long term (current) use of oral hypoglycemic drugs: Secondary | ICD-10-CM | POA: Insufficient documentation

## 2023-03-19 DIAGNOSIS — K118 Other diseases of salivary glands: Secondary | ICD-10-CM | POA: Diagnosis not present

## 2023-03-19 DIAGNOSIS — I252 Old myocardial infarction: Secondary | ICD-10-CM | POA: Diagnosis not present

## 2023-03-19 DIAGNOSIS — J449 Chronic obstructive pulmonary disease, unspecified: Secondary | ICD-10-CM | POA: Insufficient documentation

## 2023-03-19 DIAGNOSIS — Z79899 Other long term (current) drug therapy: Secondary | ICD-10-CM | POA: Insufficient documentation

## 2023-03-19 DIAGNOSIS — Z86718 Personal history of other venous thrombosis and embolism: Secondary | ICD-10-CM | POA: Insufficient documentation

## 2023-03-19 DIAGNOSIS — H5789 Other specified disorders of eye and adnexa: Secondary | ICD-10-CM | POA: Diagnosis not present

## 2023-03-19 DIAGNOSIS — I509 Heart failure, unspecified: Secondary | ICD-10-CM

## 2023-03-19 DIAGNOSIS — F17200 Nicotine dependence, unspecified, uncomplicated: Secondary | ICD-10-CM | POA: Insufficient documentation

## 2023-03-19 DIAGNOSIS — E785 Hyperlipidemia, unspecified: Secondary | ICD-10-CM | POA: Diagnosis not present

## 2023-03-19 DIAGNOSIS — Z7901 Long term (current) use of anticoagulants: Secondary | ICD-10-CM | POA: Diagnosis not present

## 2023-03-19 DIAGNOSIS — I110 Hypertensive heart disease with heart failure: Secondary | ICD-10-CM | POA: Insufficient documentation

## 2023-03-19 DIAGNOSIS — R7309 Other abnormal glucose: Secondary | ICD-10-CM | POA: Insufficient documentation

## 2023-03-19 DIAGNOSIS — D649 Anemia, unspecified: Secondary | ICD-10-CM | POA: Diagnosis not present

## 2023-03-19 DIAGNOSIS — D119 Benign neoplasm of major salivary gland, unspecified: Principal | ICD-10-CM | POA: Insufficient documentation

## 2023-03-19 DIAGNOSIS — I2510 Atherosclerotic heart disease of native coronary artery without angina pectoris: Secondary | ICD-10-CM | POA: Diagnosis not present

## 2023-03-19 DIAGNOSIS — I25119 Atherosclerotic heart disease of native coronary artery with unspecified angina pectoris: Secondary | ICD-10-CM | POA: Diagnosis not present

## 2023-03-19 HISTORY — PX: PAROTIDECTOMY: SHX2163

## 2023-03-19 LAB — CBC
HCT: 47.3 % (ref 39.0–52.0)
Hemoglobin: 15.7 g/dL (ref 13.0–17.0)
MCH: 31.9 pg (ref 26.0–34.0)
MCHC: 33.2 g/dL (ref 30.0–36.0)
MCV: 96.1 fL (ref 80.0–100.0)
Platelets: 144 10*3/uL — ABNORMAL LOW (ref 150–400)
RBC: 4.92 MIL/uL (ref 4.22–5.81)
RDW: 14.1 % (ref 11.5–15.5)
WBC: 5.1 10*3/uL (ref 4.0–10.5)
nRBC: 0 % (ref 0.0–0.2)

## 2023-03-19 LAB — COMPREHENSIVE METABOLIC PANEL
ALT: 23 U/L (ref 0–44)
AST: 31 U/L (ref 15–41)
Albumin: 3.2 g/dL — ABNORMAL LOW (ref 3.5–5.0)
Alkaline Phosphatase: 91 U/L (ref 38–126)
Anion gap: 12 (ref 5–15)
BUN: 11 mg/dL (ref 8–23)
CO2: 24 mmol/L (ref 22–32)
Calcium: 9.4 mg/dL (ref 8.9–10.3)
Chloride: 100 mmol/L (ref 98–111)
Creatinine, Ser: 0.85 mg/dL (ref 0.61–1.24)
GFR, Estimated: >60 mL/min (ref >60)
Glucose, Bld: 107 mg/dL — ABNORMAL HIGH (ref 70–99)
Potassium: 4.3 mmol/L (ref 3.5–5.1)
Sodium: 136 mmol/L (ref 135–145)
Total Bilirubin: 0.8 mg/dL (ref <1.2)
Total Protein: 8.2 g/dL — ABNORMAL HIGH (ref 6.5–8.1)

## 2023-03-19 LAB — GLUCOSE, CAPILLARY
Glucose-Capillary: 109 mg/dL — ABNORMAL HIGH (ref 70–99)
Glucose-Capillary: 83 mg/dL (ref 70–99)

## 2023-03-19 SURGERY — EXCISION, PAROTID GLAND
Anesthesia: General | Site: Head | Laterality: Left

## 2023-03-19 MED ORDER — ORAL CARE MOUTH RINSE: 15.0000 mL | Freq: Once | OROMUCOSAL | Status: AC

## 2023-03-19 MED ORDER — OXYCODONE HCL 5 MG/5ML PO SOLN: 5.0000 mg | Freq: Once | ORAL | Status: DC | PRN

## 2023-03-19 MED ORDER — BACITRACIN ZINC 500 UNIT/GM EX OINT
TOPICAL_OINTMENT | CUTANEOUS | Status: AC
  Filled 2023-03-19: qty 28.35

## 2023-03-19 MED ORDER — ACETAMINOPHEN 160 MG/5ML PO SOLN: 325.0000 mg | ORAL | Status: DC | PRN

## 2023-03-19 MED ORDER — SUCCINYLCHOLINE CHLORIDE 200 MG/10ML IV SOSY
PREFILLED_SYRINGE | INTRAVENOUS | Status: DC | PRN
  Administered 2023-03-19: 160 mg via INTRAVENOUS

## 2023-03-19 MED ORDER — MEPERIDINE HCL 25 MG/ML IJ SOLN: 6.2500 mg | INTRAMUSCULAR | Status: DC | PRN

## 2023-03-19 MED ORDER — PHENYLEPHRINE HCL-NACL 20-0.9 MG/250ML-% IV SOLN
INTRAVENOUS | Status: DC | PRN
  Administered 2023-03-19: 20 ug/min via INTRAVENOUS

## 2023-03-19 MED ORDER — DEXAMETHASONE SODIUM PHOSPHATE 10 MG/ML IJ SOLN
INTRAMUSCULAR | Status: DC | PRN
  Administered 2023-03-19: 10 mg via INTRAVENOUS

## 2023-03-19 MED ORDER — MIDAZOLAM HCL 2 MG/2ML IJ SOLN
INTRAMUSCULAR | Status: AC
  Filled 2023-03-19: qty 2

## 2023-03-19 MED ORDER — FENTANYL CITRATE (PF) 250 MCG/5ML IJ SOLN
INTRAMUSCULAR | Status: AC
  Filled 2023-03-19: qty 5

## 2023-03-19 MED ORDER — ONDANSETRON HCL 4 MG/2ML IJ SOLN
INTRAMUSCULAR | Status: DC | PRN
  Administered 2023-03-19: 4 mg via INTRAVENOUS

## 2023-03-19 MED ORDER — CHLORHEXIDINE GLUCONATE 0.12 % MT SOLN: 15.0000 mL | Freq: Once | OROMUCOSAL | Status: AC

## 2023-03-19 MED ORDER — PHENYLEPHRINE 80 MCG/ML (10ML) SYRINGE FOR IV PUSH (FOR BLOOD PRESSURE SUPPORT)
PREFILLED_SYRINGE | INTRAVENOUS | Status: DC | PRN
  Administered 2023-03-19 (×2): 80 ug via INTRAVENOUS

## 2023-03-19 MED ORDER — PROPOFOL 10 MG/ML IV BOLUS
INTRAVENOUS | Status: AC
  Filled 2023-03-19: qty 20

## 2023-03-19 MED ORDER — PROPOFOL 10 MG/ML IV BOLUS
INTRAVENOUS | Status: DC | PRN
  Administered 2023-03-19: 200 mg via INTRAVENOUS

## 2023-03-19 MED ORDER — ACETAMINOPHEN 325 MG PO TABS: 325.0000 mg | ORAL_TABLET | ORAL | Status: DC | PRN

## 2023-03-19 MED ORDER — HYDROMORPHONE HCL 1 MG/ML IJ SOLN
INTRAMUSCULAR | Status: DC | PRN
  Administered 2023-03-19: .5 mg via INTRAVENOUS

## 2023-03-19 MED ORDER — SODIUM CHLORIDE 0.9 % IV SOLN: INTRAVENOUS | Status: DC

## 2023-03-19 MED ORDER — FENTANYL CITRATE (PF) 250 MCG/5ML IJ SOLN
INTRAMUSCULAR | Status: DC | PRN
  Administered 2023-03-19: 50 ug via INTRAVENOUS
  Administered 2023-03-19 (×2): 100 ug via INTRAVENOUS

## 2023-03-19 MED ORDER — LIDOCAINE 2% (20 MG/ML) 5 ML SYRINGE
INTRAMUSCULAR | Status: DC | PRN
  Administered 2023-03-19: 100 mg via INTRAVENOUS

## 2023-03-19 MED ORDER — FENTANYL CITRATE (PF) 100 MCG/2ML IJ SOLN: 25.0000 ug | INTRAMUSCULAR | Status: DC | PRN

## 2023-03-19 MED ORDER — HYDROMORPHONE HCL 1 MG/ML IJ SOLN
INTRAMUSCULAR | Status: AC
  Filled 2023-03-19: qty 0.5

## 2023-03-19 MED ORDER — MIDAZOLAM HCL 2 MG/2ML IJ SOLN
INTRAMUSCULAR | Status: DC | PRN
  Administered 2023-03-19: 2 mg via INTRAVENOUS

## 2023-03-19 MED ORDER — CHLORHEXIDINE GLUCONATE 0.12 % MT SOLN
OROMUCOSAL | Status: AC
  Administered 2023-03-19: 15 mL via OROMUCOSAL
  Filled 2023-03-19: qty 15

## 2023-03-19 MED ORDER — LIDOCAINE-EPINEPHRINE 1 %-1:100000 IJ SOLN
INTRAMUSCULAR | Status: AC
  Filled 2023-03-19: qty 1

## 2023-03-19 MED ORDER — OXYCODONE HCL 5 MG PO TABS: 5.0000 mg | ORAL_TABLET | Freq: Once | ORAL | Status: DC | PRN

## 2023-03-19 MED ORDER — 0.9 % SODIUM CHLORIDE (POUR BTL) OPTIME
TOPICAL | Status: DC | PRN
  Administered 2023-03-19: 1000 mL

## 2023-03-19 MED ORDER — ACETAMINOPHEN 10 MG/ML IV SOLN
1000.0000 mg | Freq: Once | INTRAVENOUS | Status: AC
  Administered 2023-03-19: 1000 mg via INTRAVENOUS
  Filled 2023-03-19: qty 100

## 2023-03-19 SURGICAL SUPPLY — 47 items
ATTRACTOMAT 16X20 MAGNETIC DRP (DRAPES) IMPLANT
BAG COUNTER SPONGE SURGICOUNT (BAG) ×1 IMPLANT
BLADE CLIPPER SURG (BLADE) IMPLANT
BLADE SURG 15 STRL LF DISP TIS (BLADE) IMPLANT
CANISTER SUCT 3000ML PPV (MISCELLANEOUS) ×1 IMPLANT
CLEANER TIP ELECTROSURG 2X2 (MISCELLANEOUS) ×1 IMPLANT
CNTNR URN SCR LID CUP LEK RST (MISCELLANEOUS) IMPLANT
CORD BIPOLAR FORCEPS 12FT (ELECTRODE) ×1 IMPLANT
COVER SURGICAL LIGHT HANDLE (MISCELLANEOUS) ×1 IMPLANT
DERMABOND ADVANCED .7 DNX12 (GAUZE/BANDAGES/DRESSINGS) ×1 IMPLANT
DRAIN JACKSON RD 7FR 3/32 (WOUND CARE) IMPLANT
DRAIN JP 10F RND RADIO (DRAIN) IMPLANT
DRAPE HALF SHEET 40X57 (DRAPES) IMPLANT
DRAPE INCISE 23X17 STRL (DRAPES) ×1 IMPLANT
DRAPE INCISE IOBAN 23X17 STRL (DRAPES) ×1
ELECT COATED BLADE 2.86 ST (ELECTRODE) ×1 IMPLANT
ELECT REM PT RETURN 9FT ADLT (ELECTROSURGICAL) ×1
ELECTRODE REM PT RTRN 9FT ADLT (ELECTROSURGICAL) ×1 IMPLANT
EVACUATOR SILICONE 100CC (DRAIN) IMPLANT
FORCEPS BIPOLAR SPETZLER 8 1.0 (NEUROSURGERY SUPPLIES) ×1 IMPLANT
GAUZE 4X4 16PLY ~~LOC~~+RFID DBL (SPONGE) ×1 IMPLANT
GAUZE SPONGE 4X4 12PLY STRL (GAUZE/BANDAGES/DRESSINGS) IMPLANT
GLOVE ECLIPSE 7.5 STRL STRAW (GLOVE) ×1 IMPLANT
GOWN STRL REUS W/ TWL LRG LVL3 (GOWN DISPOSABLE) ×2 IMPLANT
KIT BASIN OR (CUSTOM PROCEDURE TRAY) ×1 IMPLANT
KIT TURNOVER KIT B (KITS) ×1 IMPLANT
LOCATOR NERVE 3 VOLT (DISPOSABLE) IMPLANT
NDL PRECISIONGLIDE 27X1.5 (NEEDLE) IMPLANT
NEEDLE PRECISIONGLIDE 27X1.5 (NEEDLE)
NS IRRIG 1000ML POUR BTL (IV SOLUTION) ×1 IMPLANT
PAD ARMBOARD 7.5X6 YLW CONV (MISCELLANEOUS) ×2 IMPLANT
PENCIL FOOT CONTROL (ELECTRODE) ×1 IMPLANT
SHEARS HARMONIC 9CM CVD (BLADE) ×1 IMPLANT
STAPLER SKIN PROX WIDE 3.9 (STAPLE) IMPLANT
STAPLER VISISTAT 35W (STAPLE) ×1 IMPLANT
SUT CHROMIC 3 0 SH 27 (SUTURE) IMPLANT
SUT CHROMIC 4 0 RB 1X27 (SUTURE) IMPLANT
SUT ETHILON 3 0 PS 1 (SUTURE) IMPLANT
SUT MNCRL AB 4-0 PS2 18 (SUTURE) ×1 IMPLANT
SUT NYLON ETHILON 5-0 P-3 1X18 (SUTURE) IMPLANT
SUT SILK 2 0 SH CR/8 (SUTURE) ×1 IMPLANT
SUT SILK 4 0 REEL (SUTURE) ×1 IMPLANT
SUT VIC AB 3-0 SH 18 (SUTURE) IMPLANT
SYR CONTROL 10ML LL (SYRINGE) IMPLANT
TOWEL GREEN STERILE FF (TOWEL DISPOSABLE) ×1 IMPLANT
TRAY ENT MC OR (CUSTOM PROCEDURE TRAY) ×1 IMPLANT
TRAY FOLEY MTR SLVR 14FR STAT (SET/KITS/TRAYS/PACK) IMPLANT

## 2023-03-19 NOTE — Op Note (Signed)
OPERATIVE REPORT  DATE OF SURGERY: 03/19/2023  PATIENT:  [name],  62 y.o. male  PRE-OPERATIVE DIAGNOSIS: Left parotid mass  POST-OPERATIVE DIAGNOSIS: Left parotid mass  PROCEDURE:  Procedure(s): PAROTIDECTOMY, superficial, left with facial nerve dissection  SURGEON:  Susy Frizzle, MD  ASSISTANTS: RNFA  ANESTHESIA:   General   EBL: 50 ml  DRAINS: 10 French round JP  LOCAL MEDICATIONS USED:  None  SPECIMEN: Left parotid  COUNTS:  Correct  PROCEDURE DETAILS: The patient was taken to the operating room and placed on the operating table in the supine position. Following induction of general endotracheal anesthesia, the left side of the face was prepped and draped in a standard fashion. A preauricular incision was outlined with a marking pen with extension down around the mastoid process and 2 fingerbreadths below the angle of the mandible.  Electrocautery was used to incise the skin and subcutaneous tissue. The lateral branch of the greater auricular nerve was preserved. The parotid gland was dissected forward off of the upper sternocleidomastoid muscle and the digastric muscle was exposed posteriorly. The gland was also dissected off of the external auditory canal. Careful dissection superior to the digastric muscle and medial to the tympanomastoid suture line revealed the main trunk of the facial nerve. Using a McCabe dissector the main trunk was dissected out towards the pes anserinus. The upper and lower divisions were then dissected identifying all branches.  The harmonic scalpel was used to divide the parotid tissue. Bipolar cautery was used as needed for completion of hemostasis. The tumor was identified in the middle part of the gland. The glandular tissue with the accompanying tumor was dissected free and removed, sent for pathologic evaluation. The wound was irrigated with saline and hemostasis was completed as needed.  The battery-operated facial nerve stimulator  was used to stimulate the main trunk and the upper and lower divisions at the end of the procedure and everything was functional.  The drain was exited through a separate stab incision and secured in place with nylon suture. A running subcuticular chromic closure was accomplished and Dermabond was used on the skin. The drain was charged. She was awakened, extubated and transferred to recovery in stable condition.    PATIENT DISPOSITION:  To PACU, stable

## 2023-03-19 NOTE — Anesthesia Procedure Notes (Addendum)
Procedure Name: Intubation Date/Time: 03/19/2023 3:49 PM  Performed by: Ardean Larsen, CRNAPre-anesthesia Checklist: Patient identified, Emergency Drugs available, Suction available and Patient being monitored Patient Re-evaluated:Patient Re-evaluated prior to induction Oxygen Delivery Method: Circle System Utilized Preoxygenation: Pre-oxygenation with 100% oxygen Induction Type: IV induction Ventilation: Mask ventilation without difficulty Laryngoscope Size: Mac and 3 Grade View: Grade I Tube type: Oral Tube size: 7.5 mm Number of attempts: 1 Airway Equipment and Method: Stylet and Oral airway Placement Confirmation: ETT inserted through vocal cords under direct vision, positive ETCO2 and breath sounds checked- equal and bilateral Tube secured with: Tape Dental Injury: Teeth and Oropharynx as per pre-operative assessment

## 2023-03-19 NOTE — Interval H&P Note (Signed)
History and Physical Interval Note:  03/19/2023 3:26 PM  [name]  has presented today for surgery, with the diagnosis of Eye swelling right.  The various methods of treatment have been discussed with the patient and family. After consideration of risks, benefits and other options for treatment, the patient has consented to  Procedure(s): PAROTIDECTOMY (Right) as a surgical intervention.  The patient's history has been reviewed, patient examined, no change in status, stable for surgery.  I have reviewed the patient's chart and labs.  Questions were answered to the patient's satisfaction.     Serena Colonel

## 2023-03-19 NOTE — Transfer of Care (Signed)
Immediate Anesthesia Transfer of Care Note  Patient: [name]  Procedure(s) Performed: PAROTIDECTOMY (Left: Head)  Patient Location: PACU  Anesthesia Type:General  Level of Consciousness: awake, alert , and oriented  Airway & Oxygen Therapy: Patient Spontanous Breathing and Patient connected to face mask oxygen  Post-op Assessment: Report given to RN and Post -op Vital signs reviewed and stable  Post vital signs: Reviewed and stable  Last Vitals:  Vitals Value Taken Time  BP 141/81 03/19/23 1735  Temp    Pulse 94 03/19/23 1741  Resp 10 03/19/23 1741  SpO2 95 % 03/19/23 1741  Vitals shown include unfiled device data.  Last Pain:  Vitals:   03/19/23 1320  PainSc: 0-No pain         Complications: No notable events documented.

## 2023-03-20 ENCOUNTER — Other Ambulatory Visit (HOSPITAL_COMMUNITY): Payer: Self-pay

## 2023-03-20 ENCOUNTER — Encounter (HOSPITAL_COMMUNITY): Payer: Self-pay | Admitting: Otolaryngology

## 2023-03-20 DIAGNOSIS — Z7984 Long term (current) use of oral hypoglycemic drugs: Secondary | ICD-10-CM | POA: Diagnosis not present

## 2023-03-20 DIAGNOSIS — I509 Heart failure, unspecified: Secondary | ICD-10-CM | POA: Diagnosis not present

## 2023-03-20 DIAGNOSIS — I25119 Atherosclerotic heart disease of native coronary artery with unspecified angina pectoris: Secondary | ICD-10-CM | POA: Diagnosis not present

## 2023-03-20 DIAGNOSIS — E785 Hyperlipidemia, unspecified: Secondary | ICD-10-CM | POA: Diagnosis not present

## 2023-03-20 DIAGNOSIS — D119 Benign neoplasm of major salivary gland, unspecified: Secondary | ICD-10-CM | POA: Diagnosis not present

## 2023-03-20 DIAGNOSIS — Z7901 Long term (current) use of anticoagulants: Secondary | ICD-10-CM | POA: Diagnosis not present

## 2023-03-20 DIAGNOSIS — J449 Chronic obstructive pulmonary disease, unspecified: Secondary | ICD-10-CM | POA: Diagnosis not present

## 2023-03-20 DIAGNOSIS — Z79899 Other long term (current) drug therapy: Secondary | ICD-10-CM | POA: Diagnosis not present

## 2023-03-20 DIAGNOSIS — R7309 Other abnormal glucose: Secondary | ICD-10-CM | POA: Diagnosis not present

## 2023-03-20 MED ORDER — NITROGLYCERIN 0.4 MG SL SUBL: 0.4000 mg | SUBLINGUAL_TABLET | SUBLINGUAL | Status: DC | PRN

## 2023-03-20 MED ORDER — EVOLOCUMAB 140 MG/ML ~~LOC~~ SOAJ: 140.0000 mg | SUBCUTANEOUS | Status: DC

## 2023-03-20 MED ORDER — LOSARTAN POTASSIUM 25 MG PO TABS
25.0000 mg | ORAL_TABLET | Freq: Every day | ORAL | Status: DC
  Administered 2023-03-20 – 2023-03-21 (×2): 25 mg via ORAL
  Filled 2023-03-20 (×2): qty 1

## 2023-03-20 MED ORDER — HYDROCODONE-ACETAMINOPHEN 7.5-325 MG PO TABS
1.0000 | ORAL_TABLET | Freq: Four times a day (QID) | ORAL | 0 refills | Status: DC | PRN
  Filled 2023-03-20: qty 20, 5d supply, fill #0

## 2023-03-20 MED ORDER — ALBUTEROL SULFATE (2.5 MG/3ML) 0.083% IN NEBU: 2.5000 mg | INHALATION_SOLUTION | Freq: Four times a day (QID) | RESPIRATORY_TRACT | Status: DC | PRN

## 2023-03-20 MED ORDER — ONDANSETRON 4 MG PO TBDP
4.0000 mg | ORAL_TABLET | Freq: Three times a day (TID) | ORAL | 0 refills | Status: DC | PRN
  Filled 2023-03-20: qty 20, 7d supply, fill #0

## 2023-03-20 MED ORDER — PANTOPRAZOLE SODIUM 40 MG PO TBEC
40.0000 mg | DELAYED_RELEASE_TABLET | Freq: Every day | ORAL | Status: DC
  Administered 2023-03-20 – 2023-03-21 (×2): 40 mg via ORAL
  Filled 2023-03-20 (×2): qty 1

## 2023-03-20 MED ORDER — EMPAGLIFLOZIN 10 MG PO TABS
10.0000 mg | ORAL_TABLET | Freq: Every day | ORAL | Status: DC
  Administered 2023-03-21: 10 mg via ORAL
  Filled 2023-03-20 (×3): qty 1

## 2023-03-20 MED ORDER — GABAPENTIN 100 MG PO CAPS
100.0000 mg | ORAL_CAPSULE | Freq: Every day | ORAL | Status: DC
  Administered 2023-03-20: 100 mg via ORAL
  Filled 2023-03-20: qty 1

## 2023-03-20 MED ORDER — FLUTICASONE PROPIONATE 50 MCG/ACT NA SUSP
2.0000 | Freq: Two times a day (BID) | NASAL | Status: DC
  Administered 2023-03-20 – 2023-03-21 (×2): 2 via NASAL
  Filled 2023-03-20 (×2): qty 16

## 2023-03-20 MED ORDER — ISOSORBIDE MONONITRATE ER 30 MG PO TB24
30.0000 mg | ORAL_TABLET | Freq: Every day | ORAL | Status: DC
  Administered 2023-03-20 – 2023-03-21 (×2): 30 mg via ORAL
  Filled 2023-03-20 (×2): qty 1

## 2023-03-20 MED ORDER — METOPROLOL SUCCINATE ER 25 MG PO TB24
25.0000 mg | ORAL_TABLET | Freq: Every day | ORAL | Status: DC
  Administered 2023-03-20 – 2023-03-21 (×2): 25 mg via ORAL
  Filled 2023-03-20 (×2): qty 1

## 2023-03-20 MED ORDER — FLUTICASONE FUROATE-VILANTEROL 100-25 MCG/ACT IN AEPB
1.0000 | INHALATION_SPRAY | Freq: Every day | RESPIRATORY_TRACT | Status: DC
Start: 2023-03-20 — End: 2023-03-21
  Administered 2023-03-21: 1 via RESPIRATORY_TRACT
  Filled 2023-03-20 (×2): qty 28

## 2023-03-20 MED ORDER — FUROSEMIDE 40 MG PO TABS: 40.0000 mg | ORAL_TABLET | Freq: Every day | ORAL | Status: DC | PRN

## 2023-03-20 MED ORDER — MONTELUKAST SODIUM 10 MG PO TABS
10.0000 mg | ORAL_TABLET | Freq: Every day | ORAL | Status: DC
  Administered 2023-03-20: 10 mg via ORAL
  Filled 2023-03-20: qty 1

## 2023-03-20 MED ORDER — DEXTROSE-SODIUM CHLORIDE 5-0.9 % IV SOLN: INTRAVENOUS | Status: AC

## 2023-03-20 MED ORDER — IBUPROFEN 100 MG/5ML PO SUSP: 400.0000 mg | Freq: Four times a day (QID) | ORAL | Status: DC | PRN

## 2023-03-20 MED ORDER — HYDROCODONE-ACETAMINOPHEN 5-325 MG PO TABS: 1.0000 | ORAL_TABLET | ORAL | Status: DC | PRN

## 2023-03-20 MED ORDER — ALBUTEROL SULFATE HFA 108 (90 BASE) MCG/ACT IN AERS: 2.0000 | INHALATION_SPRAY | Freq: Four times a day (QID) | RESPIRATORY_TRACT | Status: DC | PRN

## 2023-03-20 MED ORDER — UMECLIDINIUM BROMIDE 62.5 MCG/ACT IN AEPB
1.0000 | INHALATION_SPRAY | Freq: Every day | RESPIRATORY_TRACT | Status: DC
  Administered 2023-03-21: 1 via RESPIRATORY_TRACT
  Filled 2023-03-20 (×2): qty 7

## 2023-03-20 MED ORDER — EZETIMIBE 10 MG PO TABS
10.0000 mg | ORAL_TABLET | Freq: Every day | ORAL | Status: DC
  Administered 2023-03-20 – 2023-03-21 (×2): 10 mg via ORAL
  Filled 2023-03-20 (×2): qty 1

## 2023-03-20 NOTE — Evaluation (Signed)
Physical Therapy Evaluation Patient Details Name: [name] MRN: [id number] DOB: [birth year] Today's Date: 03/20/2023  History of Present Illness  62 y.o. male presents to [facility] hospital on 03/19/2023 for parotidectomy and L facial nerve dissection. PMH includes PE, NSTEMI, CHF, HTN, COPD, HTN, liver fibrosis, psoriasis.  Clinical Impression  Pt presents to PT at or near his baseline. Pt is able to ambulate for household and limited community distances without support of DME. Pt is encouraged to mobilize frequently during the remainder of this admission in an effort to improve activity tolerance, however the pt does not appear to require further PT services. Mobility specialist team will follow. Acute PT signing off.        If plan is discharge home, recommend the following: Assist for transportation   Can travel by private vehicle        Equipment Recommendations None recommended by PT  Recommendations for Other Services       Functional Status Assessment Patient has not had a recent decline in their functional status     Precautions / Restrictions Precautions Precautions: Other (comment) Precaution Comments: JP drain Restrictions Weight Bearing Restrictions Per Provider Order: No      Mobility  Bed Mobility Overal bed mobility: Independent                  Transfers Overall transfer level: Modified independent                      Ambulation/Gait Ambulation/Gait assistance: Modified independent (Device/Increase time) Gait Distance (Feet): 400 Feet Assistive device: IV Pole, None (IV pole initial 100', progressing to no DME) Gait Pattern/deviations: WFL(Within Functional Limits) Gait velocity: functional Gait velocity interpretation: >2.62 ft/sec, indicative of community ambulatory   General Gait Details: steady step-through gait  Stairs            Wheelchair Mobility     Tilt Bed    Modified Rankin (Stroke Patients Only)        Balance Overall balance assessment: Independent                                           Pertinent Vitals/Pain Pain Assessment Pain Assessment: No/denies pain    Home Living Family/patient expects to be discharged to:: Private residence Living Arrangements: Alone Available Help at Discharge: Neighbor Type of Home: Apartment Home Access: Elevator       Home Layout: One level Home Equipment: None      Prior Function Prior Level of Function : Independent/Modified Independent             Mobility Comments: ambulates to grocery store and takes a cab home       Extremity/Trunk Assessment   Upper Extremity Assessment Upper Extremity Assessment: Overall WFL for tasks assessed    Lower Extremity Assessment Lower Extremity Assessment: Overall WFL for tasks assessed    Cervical / Trunk Assessment Cervical / Trunk Assessment: Normal  Communication   Communication Communication: No apparent difficulties Cueing Techniques: Verbal cues  Cognition Arousal: Alert Behavior During Therapy: WFL for tasks assessed/performed Overall Cognitive Status: Within Functional Limits for tasks assessed                                          General  Comments General comments (skin integrity, edema, etc.): VSS on RA    Exercises     Assessment/Plan    PT Assessment Patient does not need any further PT services  PT Problem List         PT Treatment Interventions      PT Goals (Current goals can be found in the Care Plan section)       Frequency       Co-evaluation               AM-PAC PT "6 Clicks" Mobility  Outcome Measure Help needed turning from your back to your side while in a flat bed without using bedrails?: None Help needed moving from lying on your back to sitting on the side of a flat bed without using bedrails?: None Help needed moving to and from a bed to a chair (including a wheelchair)?: None Help needed  standing up from a chair using your arms (e.g., wheelchair or bedside chair)?: None Help needed to walk in hospital room?: None Help needed climbing 3-5 steps with a railing? : None 6 Click Score: 24    End of Session Equipment Utilized During Treatment: Gait belt Activity Tolerance: Patient tolerated treatment well Patient left: in bed;with call bell/phone within reach Nurse Communication: Mobility status PT Visit Diagnosis: Other abnormalities of gait and mobility (R26.89)    Time: 0347-4259 PT Time Calculation (min) (ACUTE ONLY): 18 min   Charges:   PT Evaluation $PT Eval Low Complexity: 1 Low   PT General Charges $$ ACUTE PT VISIT: 1 Visit         Arlyss Gandy, PT, DPT Acute Rehabilitation Office [phone]   Arlyss Gandy 03/20/2023, 9:49 AM

## 2023-03-20 NOTE — TOC CM/SW Note (Addendum)
Transition of Care [facility]) - Inpatient Brief Assessment   Patient Details  Name: [name] MRN: [id number] Date of Birth: 1960-08-23  Transition of Care [facility]) CM/SW Contact:    Gala Lewandowsky, RN Phone Number: 03/20/2023, 1:06 PM   Clinical Narrative: Patient educated regarding the CC-44. Case Manager unsure if the patient will need RN services (unsure if JP drain will be discontinued. Case Manager discussed home health; however, patient states he is not sure if will need it. Case Manager will follow up tomorrow. Patient reports that he has support of neighbor JT and he will provide transportation home.    Per MD notes drain to be removed tomorrow and d/c home.   Transition of Care Asessment: Insurance and Status: Insurance coverage has been reviewed Patient has primary care physician: Yes Home environment has been reviewed: reviewed Prior/Current Home Services: No current home services Social Drivers of Health Review: SDOH reviewed no interventions necessary Readmission risk has been reviewed: Yes Transition of care needs: transition of care needs identified, TOC will continue to follow

## 2023-03-20 NOTE — Anesthesia Postprocedure Evaluation (Signed)
Anesthesia Post Note  Patient: AIYDEN BARTOSCH  Procedure(s) Performed: PAROTIDECTOMY (Left: Head)     Patient location during evaluation: PACU Anesthesia Type: General Level of consciousness: awake and alert Pain management: pain level controlled Vital Signs Assessment: post-procedure vital signs reviewed and stable Respiratory status: spontaneous breathing, nonlabored ventilation, respiratory function stable and patient connected to nasal cannula oxygen Cardiovascular status: blood pressure returned to baseline and stable Postop Assessment: no apparent nausea or vomiting Anesthetic complications: no   No notable events documented.  Last Vitals:  Vitals:   03/20/23 0525 03/20/23 0741  BP: (!) 149/86 (!) 154/86  Pulse: 69 60  Resp: 17 16  Temp: 36.6 C 36.4 C  SpO2: 97% 99%    Last Pain:  Vitals:   03/20/23 0741  TempSrc: Oral  PainSc:                  Stanislav Gervase

## 2023-03-20 NOTE — Discharge Instructions (Signed)
You may shower and use soap and water. Do not use any creams, oils or ointment.   Resume blood thinner tomorrow.

## 2023-03-20 NOTE — Progress Notes (Signed)
Subjective: Doing well, no complaints.  Objective: Vital signs in last 24 hours: Temp:  [97.6 F (36.4 C)-98.5 F (36.9 C)] 97.6 F (36.4 C) (12/24 0741) Pulse Rate:  [60-111] 60 (12/24 0741) Resp:  [8-20] 16 (12/24 0741) BP: (109-154)/(65-115) 154/86 (12/24 0741) SpO2:  [80 %-99 %] 99 % (12/24 0741) FiO2 (%):  [0 %] 0 % (12/23 1730) Weight:  [81.6 kg] 81.6 kg (12/23 1305) Weight change:     Intake/Output from previous day: 12/23 0701 - 12/24 0700 In: 1220 [P.O.:720; I.V.:500] Out: 605 [Urine:575; Drains:25; Blood:5] Intake/Output this shift: No intake/output data recorded.  PHYSICAL EXAM: Facial nerve function is completely normal.  Incision looks excellent.  Drain is functioning and holding a seal.  Lab Results: Recent Labs    03/19/23 1329  WBC 5.1  HGB 15.7  HCT 47.3  PLT 144*   BMET Recent Labs    03/19/23 1329  NA 136  K 4.3  CL 100  CO2 24  GLUCOSE 107*  BUN 11  CREATININE 0.85  CALCIUM 9.4    Studies/Results: No results found.  Medications: I have reviewed the patient's current medications.  Assessment/Plan: Stable postop.  Would like to keep the drain in 1 more day.  He is not comfortable going home with it.  Will keep him in another day and then remove the drain tomorrow and send him home.  He is not able to walk very much so we will see if we can get some physical therapy to get him ambulating.  LOS: 0 days    [name] 03/20/2023, 8:42 AM

## 2023-03-20 NOTE — Care Management Obs Status (Signed)
MEDICARE OBSERVATION STATUS NOTIFICATION   Patient Details  Name: [name] MRN: [id number] Date of Birth: 1960/08/16   Medicare Observation Status Notification Given:  Yes    Gala Lewandowsky, RN 03/20/2023, 1:01 PM

## 2023-03-20 NOTE — Care Management CC44 (Signed)
Condition Code 44 Documentation Completed  Patient Details  Name: [name] MRN: [id number] Date of Birth: October 27, 1960   Condition Code 44 given:  Yes Patient signature on Condition Code 44 notice:  Yes Documentation of 2 MD's agreement:  Yes Code 44 added to claim:  Yes    Gala Lewandowsky, RN 03/20/2023, 1:01 PM

## 2023-03-20 NOTE — Plan of Care (Signed)
  Problem: Pain Management: Goal: General experience of comfort will improve Outcome: Progressing   Problem: Safety: Goal: Ability to remain free from injury will improve Outcome: Progressing

## 2023-03-21 DIAGNOSIS — I25119 Atherosclerotic heart disease of native coronary artery with unspecified angina pectoris: Secondary | ICD-10-CM | POA: Diagnosis not present

## 2023-03-21 DIAGNOSIS — I509 Heart failure, unspecified: Secondary | ICD-10-CM | POA: Diagnosis not present

## 2023-03-21 DIAGNOSIS — Z7901 Long term (current) use of anticoagulants: Secondary | ICD-10-CM | POA: Diagnosis not present

## 2023-03-21 DIAGNOSIS — J449 Chronic obstructive pulmonary disease, unspecified: Secondary | ICD-10-CM | POA: Diagnosis not present

## 2023-03-21 DIAGNOSIS — D119 Benign neoplasm of major salivary gland, unspecified: Secondary | ICD-10-CM | POA: Diagnosis not present

## 2023-03-21 DIAGNOSIS — Z7984 Long term (current) use of oral hypoglycemic drugs: Secondary | ICD-10-CM | POA: Diagnosis not present

## 2023-03-21 DIAGNOSIS — R7309 Other abnormal glucose: Secondary | ICD-10-CM | POA: Diagnosis not present

## 2023-03-21 DIAGNOSIS — Z79899 Other long term (current) drug therapy: Secondary | ICD-10-CM | POA: Diagnosis not present

## 2023-03-21 DIAGNOSIS — E785 Hyperlipidemia, unspecified: Secondary | ICD-10-CM | POA: Diagnosis not present

## 2023-03-21 NOTE — Discharge Summary (Signed)
Physician Discharge Summary  Patient ID: [name] MRN: [id number] DOB/AGE: 10/04/1960 62 y.o.  Admit date: 03/19/2023 Discharge date: 03/21/2023  Admission Diagnoses: Parotid mass  Discharge Diagnoses:  Principal Problem:   Parotid mass   Discharged Condition: good  Hospital Course: 62 year old male underwent left parotidectomy.  See operative note.  He was observed in the hospital for two nights after surgery for drain management and due to lack of social support at home.  On POD 2, he was felt stable for discharge.  His drain was removed.  Consults: None  Significant Diagnostic Studies: None  Treatments: surgery: Left parotidectomy  Discharge Exam: Blood pressure (!) 103/54, pulse 62, temperature 98.7 F (37.1 C), temperature source Oral, resp. rate 17, height 6\' 2"  (1.88 m), weight 81.6 kg, SpO2 98%. General appearance: alert, cooperative, and no distress Neck: left parotid incision clean and intact, no fluid collection, drain removed, normal left facial movement  Disposition: Discharge disposition: 01-Home or Self Care       Discharge Instructions     Diet - low sodium heart healthy   Complete by: As directed    Discharge instructions   Complete by: As directed    Avoid spicy or sour food or drinks.   Increase activity slowly   Complete by: As directed       Allergies as of 03/21/2023       Reactions   Atorvastatin    myalgia        Medication List     TAKE these medications    albuterol (2.5 MG/3ML) 0.083% nebulizer solution Commonly known as: PROVENTIL Take 3 mLs (2.5 mg total) by nebulization every 6 (six) hours as needed for wheezing or shortness of breath.   albuterol 108 (90 Base) MCG/ACT inhaler Commonly known as: ProAir HFA Inhale 2 puffs into the lungs every 6 (six) hours as needed for wheezing or shortness of breath.   azelastine 0.1 % nasal spray Commonly known as: ASTELIN Place 2 sprays into both nostrils 2 (two) times  daily as needed for rhinitis. Use in each nostril as directed   Azelastine-Fluticasone 137-50 MCG/ACT Susp Place 2 sprays into the nose every 12 (twelve) hours.   cetirizine 10 MG tablet Commonly known as: ZYRTEC Take 1 tablet (10 mg total) by mouth daily.   Eliquis 5 MG Tabs tablet Generic drug: apixaban TAKE 1 TABLET (5 MG TOTAL) BY MOUTH 2 (TWO) TIMES DAILY.   ezetimibe 10 MG tablet Commonly known as: ZETIA TAKE 1 TABLET (10 MG TOTAL) BY MOUTH DAILY.   fluticasone 50 MCG/ACT nasal spray Commonly known as: FLONASE Place 2 sprays into both nostrils daily. What changed:  when to take this reasons to take this   furosemide 40 MG tablet Commonly known as: LASIX Take 1 tablet (40 mg total) by mouth daily as needed (May take 40mg  Daily for Swelling and Shortness of breath).   gabapentin 100 MG capsule Commonly known as: NEURONTIN Take 1 capsule (100 mg total) by mouth at bedtime.   HYDROcodone-acetaminophen 7.5-325 MG tablet Commonly known as: Norco Take 1 tablet by mouth every 6 (six) hours as needed for moderate pain (pain score 4-6).   isosorbide mononitrate 30 MG 24 hr tablet Commonly known as: IMDUR Take 1 tablet (30 mg total) by mouth daily.   Jardiance 10 MG Tabs tablet Generic drug: empagliflozin Take 1 tablet (10 mg total) by mouth daily before breakfast.   losartan 25 MG tablet Commonly known as: COZAAR Take 1 tablet (  25 mg total) by mouth daily.   metoprolol succinate 25 MG 24 hr tablet Commonly known as: TOPROL-XL Take 25 mg by mouth daily.   montelukast 10 MG tablet Commonly known as: Singulair Take 1 tablet (10 mg total) by mouth at bedtime.   nitroGLYCERIN 0.4 MG SL tablet Commonly known as: NITROSTAT Place 1 tablet (0.4 mg total) under the tongue every 5 (five) minutes as needed for chest pain.   ondansetron 4 MG disintegrating tablet Commonly known as: ZOFRAN-ODT Take 1 tablet (4 mg total) by mouth every 8 (eight) hours as needed for nausea  or vomiting.   pantoprazole 40 MG tablet Commonly known as: PROTONIX Take 1 tablet (40 mg total) by mouth daily. What changed:  when to take this reasons to take this   Repatha SureClick 140 MG/ML Soaj Generic drug: Evolocumab Inject 140 mg into the skin every 14 (fourteen) days.   Trelegy Ellipta 100-62.5-25 MCG/ACT Aepb Generic drug: Fluticasone-Umeclidin-Vilant Inhale 1 puff into the lungs daily.        Follow-up Information     Serena Colonel, MD. Schedule an appointment as soon as possible for a visit in 2 week(s).   Specialty: Otolaryngology Contact information: [address] [place] [address] [phone]                 Signed: Christia Reading 03/21/2023, 3:22 AM

## 2023-03-22 ENCOUNTER — Other Ambulatory Visit: Payer: Self-pay | Admitting: Pharmacist

## 2023-03-22 ENCOUNTER — Other Ambulatory Visit: Payer: Self-pay

## 2023-03-22 ENCOUNTER — Other Ambulatory Visit (HOSPITAL_COMMUNITY): Payer: Self-pay

## 2023-03-22 ENCOUNTER — Telehealth: Payer: Self-pay

## 2023-03-22 DIAGNOSIS — Z86711 Personal history of pulmonary embolism: Secondary | ICD-10-CM

## 2023-03-22 LAB — SURGICAL PATHOLOGY

## 2023-03-22 MED ORDER — APIXABAN 5 MG PO TABS
5.0000 mg | ORAL_TABLET | Freq: Two times a day (BID) | ORAL | 0 refills | Status: DC
  Filled 2023-03-22 (×2): qty 60, 30d supply, fill #0

## 2023-03-22 NOTE — Transitions of Care (Post Inpatient/ED Visit) (Signed)
   03/22/2023  Name: [name] MRN: [id number] DOB: 11/18/1960  Today's TOC FU Call Status: Today's TOC FU Call Status:: Successful TOC FU Call Completed TOC FU Call Complete Date: 03/22/23 Patient's Name and Date of Birth confirmed.  Transition Care Management Follow-up Telephone Call Date of Discharge: 03/21/23 Discharge Facility: [facility]) Type of Discharge: Inpatient Admission Primary Inpatient Discharge Diagnosis:: left parotidectomy How have you been since you were released from the hospital?: Better (He said he is feeling better, the only issue is that his face is still numb but he is aware that is to be expected after the surgery he had.) Any questions or concerns?: No  Items Reviewed: Did you receive and understand the discharge instructions provided?: Yes Medications obtained,verified, and reconciled?: Partial Review Completed Reason for Partial Mediation Review: He said he was not home at the time of this call.  He said he thinks he has all of his medications except the eliquis and he needs a new prescription.  I told him that I would check on that. he would like the rx sent to [facility] and would like it delivered.   He then said if he has any questions about the meds or needs refills, he will call this clinic tomorrow. I spoke to [employer], [facility] and he will order the eliquis and request delivery as patient requested. Any new allergies since your discharge?: No Dietary orders reviewed?: Yes Type of Diet Ordered:: heart healthy, low sodium- he said he is not having any difficulty eating and he has been tolerating a soft diet. Do you have support at home?: Yes People in Home: friend(s) Name of Support/Comfort Primary Source: He stated that he has a neighbor who checks on him  Medications Reviewed Today: Medications Reviewed Today   Medications were not reviewed in this encounter     Home Care and Equipment/Supplies: Were Home Health Services Ordered?:  No Any new equipment or medical supplies ordered?: No  Functional Questionnaire: Do you need assistance with bathing/showering or dressing?: No Do you need assistance with meal preparation?: No Do you need assistance with eating?: No Do you have difficulty maintaining continence: No Do you need assistance with getting out of bed/getting out of a chair/moving?: No Do you have difficulty managing or taking your medications?: No  Follow up appointments reviewed: PCP Follow-up appointment confirmed?: Yes Date of PCP follow-up appointment?: 04/19/23 Follow-up Provider: [facility] Follow-up appointment confirmed?: Yes Date of Specialist follow-up appointment?: 03/30/23 Follow-Up Specialty Provider:: Dr Pollyann Kennedy, ENT Do you need transportation to your follow-up appointment?: No Do you understand care options if your condition(s) worsen?: Yes-patient verbalized understanding    SIGNATURE .Robyne Peers, RN

## 2023-03-23 ENCOUNTER — Other Ambulatory Visit: Payer: Self-pay

## 2023-03-23 ENCOUNTER — Telehealth: Payer: Self-pay

## 2023-03-23 ENCOUNTER — Other Ambulatory Visit: Payer: Self-pay | Admitting: Family Medicine

## 2023-03-23 DIAGNOSIS — Z86711 Personal history of pulmonary embolism: Secondary | ICD-10-CM

## 2023-03-23 DIAGNOSIS — J441 Chronic obstructive pulmonary disease with (acute) exacerbation: Secondary | ICD-10-CM

## 2023-03-23 NOTE — Telephone Encounter (Signed)
Copied from CRM [id number]. Topic: General - Other >> Mar 23, 2023 11:23 AM Everette C wrote: Reason for CRM: The patient would like to speak with a member of practice staff when possible to review their medications and active prescriptions  Please contact the patient further when possible

## 2023-03-23 NOTE — Telephone Encounter (Signed)
VM left informing patient to return phone call. Also informed him that I will mail him a copy of his medication list.

## 2023-03-23 NOTE — Telephone Encounter (Signed)
Medication Refill -  Most Recent Primary Care Visit:  Provider: Anders Simmonds  Department: [facility]  Visit Type: OFFICE VISIT  Date: 10/05/2022 Rx #: [id number]  pantoprazole (PROTONIX) 40 MG tablet [[id number]]  Fluticasone-Umeclidin-Vilant (TRELEGY ELLIPTA) 100-62.5-25 MCG/ACT AEPB [[id number]]  apixaban (ELIQUIS) 5 MG TABS tablet [[id number]]  albuterol (PROVENTIL) (2.5 MG/3ML) 0.083% nebulizer solution [250  albuterol (PROAIR HFA) 108 (90 Base) MCG/ACT inhaler [[id number]   Has the patient contacted their pharmacy? No   Is this the correct pharmacy for this prescription? Yes If no, delete pharmacy and type the correct one.  This is the patient's preferred pharmacy:  [facility] [address], [address] [place] [address] Phone: [phone] Fax: [phone]  [id number] [place], [place] - [address] AT [facility] [address] [place] [address] Phone: [phone] Fax: [phone]  [facility] [address] [place] [address] Phone: [phone] Fax: [phone]   Is the patient out of the medication? Yes  Has the patient been seen for an appointment in the last year OR does the patient have an upcoming appointment? Yes  Can we respond through MyChart? No  Agent: Please be advised that Rx refills may take up to 3 business days. We ask that you follow-up with your pharmacy.

## 2023-03-26 ENCOUNTER — Telehealth: Payer: Self-pay | Admitting: *Deleted

## 2023-03-26 DIAGNOSIS — Z79899 Other long term (current) drug therapy: Secondary | ICD-10-CM | POA: Diagnosis not present

## 2023-03-26 NOTE — Telephone Encounter (Signed)
Called patient to give him lab results for research study he is  enrolled in. Left message for patient to call back.   [name],Research Coordinator 03/26/2023  16:19

## 2023-03-29 ENCOUNTER — Other Ambulatory Visit: Payer: Self-pay

## 2023-03-29 MED ORDER — TRELEGY ELLIPTA 100-62.5-25 MCG/ACT IN AEPB
1.0000 | INHALATION_SPRAY | Freq: Every day | RESPIRATORY_TRACT | 0 refills | Status: DC
  Filled 2023-03-29 – 2023-06-21 (×2): qty 60, 30d supply, fill #0

## 2023-03-29 MED ORDER — ALBUTEROL SULFATE HFA 108 (90 BASE) MCG/ACT IN AERS
2.0000 | INHALATION_SPRAY | Freq: Four times a day (QID) | RESPIRATORY_TRACT | 0 refills | Status: DC | PRN
  Filled 2023-03-29: qty 6.7, 25d supply, fill #0

## 2023-03-29 MED ORDER — PANTOPRAZOLE SODIUM 40 MG PO TBEC
40.0000 mg | DELAYED_RELEASE_TABLET | Freq: Every day | ORAL | 6 refills | Status: DC
  Filled 2023-03-29: qty 30, 30d supply, fill #0

## 2023-03-29 MED ORDER — ALBUTEROL SULFATE (2.5 MG/3ML) 0.083% IN NEBU
2.5000 mg | INHALATION_SOLUTION | Freq: Four times a day (QID) | RESPIRATORY_TRACT | 0 refills | Status: DC | PRN
  Filled 2023-03-29: qty 75, 7d supply, fill #0

## 2023-03-29 NOTE — Telephone Encounter (Signed)
 Requested medication (s) are due for refill today: NA  Requested medication (s) are on the active medication list: {na  Last refill: na  Future visit scheduled NA  Notes to clinic: [facility] pharmacy. Pt picked up all meds except Eliquis  which was shipped on 03/23/23.Spoke with [employer]  Requested Prescriptions  Pending Prescriptions Disp Refills   Fluticasone -Umeclidin-Vilant (TRELEGY ELLIPTA ) 100-62.5-25 MCG/ACT AEPB 60 each 6    Sig: Inhale 1 puff into the lungs daily.     Off-Protocol Failed - 03/29/2023 10:10 AM      Failed - Medication not assigned to a protocol, review manually.      Passed - Valid encounter within last 12 months    Recent Outpatient Visits           5 months ago Parotid tumor   [place] [facility] - A Dept Of [place]. [facility], Jon M, [place]   9 months ago Smoking greater than 20 pack years   [place] [facility] [place] - A Dept Of [place]. [facility] Delbert Clam, [place]   1 year ago Other emphysema [facility])   [place] [facility] [place] - A Dept Of [place]. [facility] Delbert Clam, MD   1 year ago Other emphysema [facility])   [place] [facility] [place] - A Dept Of [place]. [facility] Delbert Clam, MD   2 years ago Anorexia   [place] [facility] [place] - A Dept Of [place]. [facility] [place], Kirk RAMAN, [place]       Future Appointments             In 3 weeks Delbert Clam, MD [facility] [place] - A Dept Of [place]. [facility]             albuterol  (PROVENTIL ) (2.5 MG/3ML) 0.083% nebulizer solution 75 mL 3    Sig: Take 3 mLs (2.5 mg total) by nebulization every 6 (six) hours as needed for wheezing or shortness of breath.     Pulmonology:  Beta Agonists 2 Passed - 03/29/2023 10:10 AM      Passed - Last BP in normal range    BP Readings from Last 1 Encounters:  03/21/23 135/85         Passed - Last Heart Rate in normal range     Pulse Readings from Last 1 Encounters:  03/21/23 60         Passed - Valid encounter within last 12 months    Recent Outpatient Visits           5 months ago Parotid tumor   [place] [facility] [place] - A Dept Of [place]. [facility], Jon M, [place]   9 months ago Smoking greater than 20 pack years   [place] [facility] [place] - A Dept Of [place]. [facility] Delbert Clam, MD   1 year ago Other emphysema [facility])   [place] [facility] [place] - A Dept Of [place]. [facility] Delbert Clam, MD   1 year ago Other emphysema [facility])   [place] [facility] [place] - A Dept Of [place]. [facility] Delbert Clam, MD   2 years ago Anorexia    [facility] [place] - A Dept Of . [facility] [place], Kirk RAMAN, [place]       Future Appointments  In 3 weeks Delbert Clam, MD [facility] [place] - A Dept Of [place]. [facility]             albuterol  (PROAIR  HFA) 108 (90 Base) MCG/ACT inhaler 6.7 g 6    Sig: Inhale 2 puffs into the lungs every 6 (six) hours as needed for wheezing or shortness of breath.     Pulmonology:  Beta Agonists 2 Passed - 03/29/2023 10:10 AM      Passed - Last BP in normal range    BP Readings from Last 1 Encounters:  03/21/23 135/85         Passed - Last Heart Rate in normal range    Pulse Readings from Last 1 Encounters:  03/21/23 60         Passed - Valid encounter within last 12 months    Recent Outpatient Visits           5 months ago Parotid tumor   [place] [facility] [place] - A Dept Of [place]. [facility], Jon M, [place]   9 months ago Smoking greater than 20 pack years   [place] [facility] [place] - A Dept Of [place]. [facility] Delbert Clam, MD   1 year ago Other emphysema [facility])   [place] [facility] [place] - A Dept Of [place]. [facility] Delbert Clam, MD    1 year ago Other emphysema [facility])   [place] [facility] [place] - A Dept Of [place]. [facility] Delbert Clam, MD   2 years ago Anorexia   [place] [facility] [place] - A Dept Of [place]. [facility] [place], Kirk RAMAN, [place]       Future Appointments             In 3 weeks Delbert Clam, MD [facility] [place] - A Dept Of [place]. [facility]             pantoprazole  (PROTONIX ) 40 MG tablet 30 tablet 6    Sig: Take 1 tablet (40 mg total) by mouth daily.     Gastroenterology: Proton Pump Inhibitors Passed - 03/29/2023 10:10 AM      Passed - Valid encounter within last 12 months    Recent Outpatient Visits           5 months ago Parotid tumor   [place] [facility] - A Dept Of [place]. [facility], Jon M, [place]   9 months ago Smoking greater than 20 pack years   [place] [facility] [place] - A Dept Of [place]. [facility] Delbert Clam, MD   1 year ago Other emphysema [facility])   [place] [facility] [place] - A Dept Of [place]. [facility] Delbert Clam, MD   1 year ago Other emphysema [facility])   [place] [facility] [place] - A Dept Of [place]. [facility] Delbert Clam, MD   2 years ago Anorexia   [place] [facility] [place] - A Dept Of . [facility] [place], Kirk RAMAN, [place]       Future Appointments             In 3 weeks Delbert Clam, MD [facility] [place] - A Dept Of . [facility]  apixaban  (ELIQUIS ) 5 MG TABS tablet 60 tablet 0    Sig: Take 1 tablet (5 mg total) by mouth 2 (two) times daily.     Hematology:  Anticoagulants - apixaban  Failed - 03/29/2023 10:10 AM      Failed - PLT in normal range and within 360 days    Platelets  Date Value Ref Range Status  03/19/2023 144 (L) 150 - 400 K/uL Final  01/05/2023 157 150 - 450 x10E3/uL Final         Passed - HGB in  normal range and within 360 days    Hemoglobin  Date Value Ref Range Status  03/19/2023 15.7 13.0 - 17.0 g/dL Final  89/88/7975 86.0 13.0 - 17.7 g/dL Final         Passed - HCT in normal range and within 360 days    HCT  Date Value Ref Range Status  03/19/2023 47.3 39.0 - 52.0 % Final   Hematocrit  Date Value Ref Range Status  01/05/2023 42.5 37.5 - 51.0 % Final         Passed - Cr in normal range and within 360 days    Creat  Date Value Ref Range Status  03/28/2016 0.82 0.70 - 1.33 mg/dL Final    Comment:      For patients > or = 63 years of age: The upper reference limit for Creatinine is approximately 13% higher for people identified as African-American.      Creatinine, Ser  Date Value Ref Range Status  03/19/2023 0.85 0.61 - 1.24 mg/dL Final   Creatinine,U  Date Value Ref Range Status  12/17/2009  mg/dL Final   892.2 (NOTE)  Cutoff Values for Urine Drug Screen:        Drug Class           Cutoff (ng/mL)        Amphetamines            1000        Barbiturates             200        Cocaine  Metabolites      300        Benzodiazepines          200        Methadone                 300        Opiates                 2000        Phencyclidine             25        Propoxyphene             300        Marijuana Metabolites     50  For medical purposes only.   Creatinine, Urine  Date Value Ref Range Status  06/04/2015 100.41 mg/dL Final         Passed - AST in normal range and within 360 days    AST  Date Value Ref Range Status  03/19/2023 31 15 - 41 U/L Final         Passed - ALT in normal range and within 360 days    ALT  Date Value Ref Range Status  03/19/2023 23 0 - 44 U/L Final         Passed - Valid encounter within  last 12 months    Recent Outpatient Visits           5 months ago Parotid tumor   [place] [facility] [place] - A Dept Of [place]. [facility], Jon M, [place]   9 months ago Smoking greater than 20 pack years   [facility] [place] - A Dept Of [place]. [facility] Delbert Clam, [place]   1 year ago Other emphysema [facility])   [place] [facility] [place] - A Dept Of [place]. [facility] Delbert Clam, MD   1 year ago Other emphysema [facility])    [facility] [place] - A Dept Of [place]. [facility] Delbert Clam, MD   2 years ago Anorexia    Comm [facility] [place] - A Dept Of [place]. [facility] [place], Kirk [place]AN, [place]       Future Appointments             In 3 weeks Delbert Clam, MD [facility] [place] - A Dept Of [place]. [facility]

## 2023-03-29 NOTE — Telephone Encounter (Signed)
 Need to verify pharmacy. Several meds sent to [facility]. Left VM to call back

## 2023-03-29 NOTE — Telephone Encounter (Signed)
 After listening to the recording, patient wants the medications sent to the [facility] for delivery.

## 2023-04-11 ENCOUNTER — Other Ambulatory Visit: Payer: Self-pay

## 2023-04-19 ENCOUNTER — Ambulatory Visit: Payer: No Typology Code available for payment source | Attending: Family Medicine | Admitting: Family Medicine

## 2023-04-19 ENCOUNTER — Other Ambulatory Visit: Payer: Self-pay

## 2023-04-19 ENCOUNTER — Encounter: Payer: Self-pay | Admitting: Family Medicine

## 2023-04-19 VITALS — BP 129/80 | HR 77 | Ht 74.0 in | Wt 179.4 lb

## 2023-04-19 DIAGNOSIS — Z86711 Personal history of pulmonary embolism: Secondary | ICD-10-CM

## 2023-04-19 DIAGNOSIS — Z1211 Encounter for screening for malignant neoplasm of colon: Secondary | ICD-10-CM

## 2023-04-19 DIAGNOSIS — Z23 Encounter for immunization: Secondary | ICD-10-CM | POA: Diagnosis not present

## 2023-04-19 DIAGNOSIS — I110 Hypertensive heart disease with heart failure: Secondary | ICD-10-CM | POA: Diagnosis not present

## 2023-04-19 DIAGNOSIS — J4551 Severe persistent asthma with (acute) exacerbation: Secondary | ICD-10-CM

## 2023-04-19 DIAGNOSIS — J449 Chronic obstructive pulmonary disease, unspecified: Secondary | ICD-10-CM | POA: Diagnosis not present

## 2023-04-19 DIAGNOSIS — H5789 Other specified disorders of eye and adnexa: Secondary | ICD-10-CM

## 2023-04-19 DIAGNOSIS — I5042 Chronic combined systolic (congestive) and diastolic (congestive) heart failure: Secondary | ICD-10-CM | POA: Diagnosis not present

## 2023-04-19 DIAGNOSIS — L603 Nail dystrophy: Secondary | ICD-10-CM

## 2023-04-19 DIAGNOSIS — F17210 Nicotine dependence, cigarettes, uncomplicated: Secondary | ICD-10-CM

## 2023-04-19 DIAGNOSIS — R29898 Other symptoms and signs involving the musculoskeletal system: Secondary | ICD-10-CM

## 2023-04-19 DIAGNOSIS — J438 Other emphysema: Secondary | ICD-10-CM

## 2023-04-19 MED ORDER — PANTOPRAZOLE SODIUM 40 MG PO TBEC
40.0000 mg | DELAYED_RELEASE_TABLET | Freq: Every day | ORAL | 1 refills | Status: DC
  Filled 2023-04-19: qty 90, 90d supply, fill #0
  Filled 2023-08-31: qty 90, 90d supply, fill #1

## 2023-04-19 MED ORDER — EMPAGLIFLOZIN 10 MG PO TABS
10.0000 mg | ORAL_TABLET | Freq: Every day | ORAL | 3 refills | Status: AC
  Filled 2023-04-19: qty 90, 90d supply, fill #0
  Filled 2023-08-31: qty 90, 90d supply, fill #1
  Filled 2023-11-28: qty 90, 90d supply, fill #0
  Filled 2023-11-28: qty 90, 90d supply, fill #2
  Filled 2024-02-27: qty 90, 90d supply, fill #1

## 2023-04-19 MED ORDER — ALBUTEROL SULFATE HFA 108 (90 BASE) MCG/ACT IN AERS
2.0000 | INHALATION_SPRAY | Freq: Four times a day (QID) | RESPIRATORY_TRACT | 6 refills | Status: DC | PRN
  Filled 2023-04-19: qty 6.7, 25d supply, fill #0
  Filled 2023-11-20: qty 6.7, 25d supply, fill #1
  Filled 2023-11-20: qty 6.7, 25d supply, fill #0
  Filled 2024-01-21: qty 6.7, 25d supply, fill #1

## 2023-04-19 MED ORDER — EZETIMIBE 10 MG PO TABS
10.0000 mg | ORAL_TABLET | Freq: Every day | ORAL | 3 refills | Status: AC
  Filled 2023-04-19: qty 90, 90d supply, fill #0
  Filled 2023-08-31: qty 90, 90d supply, fill #1
  Filled 2023-11-28: qty 90, 90d supply, fill #0

## 2023-04-19 MED ORDER — ONDANSETRON 4 MG PO TBDP
4.0000 mg | ORAL_TABLET | Freq: Three times a day (TID) | ORAL | 0 refills | Status: DC | PRN
  Filled 2023-04-19: qty 20, 7d supply, fill #0

## 2023-04-19 MED ORDER — LOSARTAN POTASSIUM 25 MG PO TABS
25.0000 mg | ORAL_TABLET | Freq: Every day | ORAL | 3 refills | Status: AC
  Filled 2023-04-19: qty 90, 90d supply, fill #0
  Filled 2023-08-31: qty 90, 90d supply, fill #1
  Filled 2023-11-28: qty 90, 90d supply, fill #0
  Filled 2023-11-28: qty 90, 90d supply, fill #2
  Filled 2024-03-07: qty 90, 90d supply, fill #1

## 2023-04-19 MED ORDER — APIXABAN 5 MG PO TABS
5.0000 mg | ORAL_TABLET | Freq: Two times a day (BID) | ORAL | 1 refills | Status: AC
  Filled 2023-04-19: qty 180, 90d supply, fill #0
  Filled 2023-11-16: qty 180, 90d supply, fill #1
  Filled 2023-11-20: qty 180, 90d supply, fill #0
  Filled 2023-11-20: qty 180, 90d supply, fill #1

## 2023-04-19 MED ORDER — ALBUTEROL SULFATE (2.5 MG/3ML) 0.083% IN NEBU
2.5000 mg | INHALATION_SOLUTION | Freq: Four times a day (QID) | RESPIRATORY_TRACT | 6 refills | Status: AC | PRN
  Filled 2023-04-19: qty 90, 8d supply, fill #0
  Filled 2024-01-21: qty 90, 8d supply, fill #1
  Filled 2024-01-21: qty 90, 8d supply, fill #0

## 2023-04-19 MED ORDER — BUPROPION HCL ER (XL) 150 MG PO TB24
150.0000 mg | ORAL_TABLET | Freq: Every day | ORAL | 1 refills | Status: AC
  Filled 2023-04-19: qty 90, 90d supply, fill #0

## 2023-04-19 MED ORDER — METOPROLOL SUCCINATE ER 25 MG PO TB24
25.0000 mg | ORAL_TABLET | Freq: Every day | ORAL | 1 refills | Status: AC
  Filled 2023-04-19: qty 90, 90d supply, fill #0
  Filled 2023-08-31: qty 90, 90d supply, fill #1

## 2023-04-19 MED ORDER — GABAPENTIN 100 MG PO CAPS
100.0000 mg | ORAL_CAPSULE | Freq: Every day | ORAL | 1 refills | Status: AC
  Filled 2023-04-19: qty 90, 90d supply, fill #0

## 2023-04-19 MED ORDER — AZELASTINE-FLUTICASONE 137-50 MCG/ACT NA SUSP
2.0000 | Freq: Two times a day (BID) | NASAL | 5 refills | Status: AC
  Filled 2023-04-19 – 2023-05-07 (×3): qty 23, 30d supply, fill #0
  Filled 2023-06-20 (×3): qty 23, 30d supply, fill #1

## 2023-04-19 MED ORDER — MONTELUKAST SODIUM 10 MG PO TABS
10.0000 mg | ORAL_TABLET | Freq: Every day | ORAL | 1 refills | Status: AC
  Filled 2023-04-19: qty 90, 90d supply, fill #0
  Filled 2023-06-20 – 2023-06-26 (×3): qty 90, 90d supply, fill #1

## 2023-04-19 NOTE — Progress Notes (Signed)
Subjective:  Patient ID: [name], male    DOB: Oct 10, 1960  Age: 63 y.o. MRN: [id number]  CC: Hospitalization Follow-up (Weakness in legs/Needs referral: Podiatry, eye)   HPI [name] is a 63 y.o. year old male with a history of COPD, chronic sinusitis, recurrent DVT and PE (on anticoagulation with Eliquis),  coronary artery disease (status post stent, last cardiac cath from 12/2022 revealed 20% proximal LCx lesion, 30% proximal RCA lesion, 30% mid to distal RCA lesion, 100% occluded D1, 50% proximal to mid LAD lesion and 50% ramus lesion, medical management recommended),  (EF 40-45% from 06/2022),tobacco abuse, status post posterior lumbar interbody fusion in 10/2020, history of parotidectomy in 02/2023 (pathology revealed Warthin's tumor), Nicotine dependence (about 10 Cigarettes/day >27yrs)  Interval History: Discussed the use of AI scribe software for clinical note transcription with the patient, who gave verbal consent to proceed.  He presents for a follow-up visit. The patient reports worsening leg weakness and difficulty getting up from a chair. The patient describes having to lift his legs manually to put on pants and struggles to rise from a seated position. Despite previous back surgery and physical therapy, the patient's symptoms have not improved.  I had ordered autoimmune panel 2 years ago to evaluate for his lower extremity weakness and labs were negative.  He also did see a vascular surgeon. The patient also reports a swelling in the right upper eyelid , which has been present for an unspecified duration.  He states he usually receives prednisone with resolution of symptoms.  He has no blurry vision.  He did have a follow-up visit with the cardiology nurse practitioner 2 months ago and remains adherent with his cardiac meds.  He has no dyspnea or chest pain.  The patient has a long history of smoking, currently smoking about five cigarettes a day, down from a previous  average of ten cigarettes a day. The patient expresses a desire to quit smoking and is open to pharmacological assistance to manage cravings.        Past Medical History:  Diagnosis Date   Asthma    CAD (coronary artery disease)    a. 09/2013 NSTEMI/PCI: LM nl, LAD 40-50%, D1 100 (2.25 x 28 Vision BMS), LCX min irregs, RI 60-70, 30, RCA 40-50/50-10ms/p, EF 45-50%.  b. cath 12/2014 -occulded BMS in diag, 50% pro to mid LAD, 60% ramus, 30% RCA    Chest pain 08/2015   Chronic diastolic CHF (congestive heart failure) (HCC)    a. 09/2014 EF 45-50% by LV gram;  b. 01/2014 Echo: EF 55-60%, Gr 1 DD.   Cocaine abuse (HCC)    COPD (chronic obstructive pulmonary disease) (HCC)    DVT (deep venous thrombosis) (HCC)    "I had ~ 10 in each leg" (12/20/2016)   Essential hypertension    Hepatitis C    "clear free over a year now"   High cholesterol    Myocardial infarction [facility]) ~ 2014   Osteoarthritis    a. hands and toes.   Pneumonia    "several times" (12/20/2016)   Pulmonary embolism (HCC)    a. 2012 - s/p IVC filter;  b. prev on eliquis - noncompliant.   Shortness of breath dyspnea    Sinus headache    Squamous cell skin cancer 07/13/2016   "foot"   Tobacco abuse     Past Surgical History:  Procedure Laterality Date   BACK SURGERY  2022   CARDIAC CATHETERIZATION N/A 01/04/2015  Procedure: Left Heart Cath and Coronary Angiography;  Surgeon: Kathleene Hazel, MD;  Location: [facility] INVASIVE CV LAB;  Service: Cardiovascular;  Laterality: N/A;   CORONARY ANGIOPLASTY WITH STENT PLACEMENT  10/21/2013   BMS to D1   CYST EXCISION N/A 05/10/2016   Procedure: EXCISION OF SEBACEOUS CYST UPPER BACK;  Surgeon: Manus Rudd, MD;  Location: [facility] OR;  Service: General;  Laterality: N/A;   FOOT SURGERY Right ~ 06/2016   "said there was skin cancer on it"   LEFT HEART CATH AND CORONARY ANGIOGRAPHY N/A 01/11/2023   Procedure: LEFT HEART CATH AND CORONARY ANGIOGRAPHY;  Surgeon: Kathleene Hazel, MD;   Location: [facility] INVASIVE CV LAB;  Service: Cardiovascular;  Laterality: N/A;   LEFT HEART CATHETERIZATION WITH CORONARY ANGIOGRAM N/A 10/21/2013   Procedure: LEFT HEART CATHETERIZATION WITH CORONARY ANGIOGRAM;  Surgeon: Marykay Lex, MD;  Location: [facility] CATH LAB;  Service: Cardiovascular;  Laterality: N/A;   PAROTIDECTOMY Left 03/19/2023   Procedure: PAROTIDECTOMY;  Surgeon: Serena Colonel, MD;  Location: [facility] OR;  Service: ENT;  Laterality: Left;   VENA CAVA FILTER PLACEMENT  ~ 2007-~ 2017   "1 in my neck; 2 in my wrist"    Family History  Problem Relation Age of Onset   Asthma Mother    Allergies Mother    Allergies Sister    Allergies Brother    Deep vein thrombosis Brother        two brothers with recurrent DVT   Heart attack Father        a.60s b. deceased in his 80s   Coronary artery disease Father    Colon cancer Neg Hx    Liver cancer Neg Hx    Esophageal cancer Neg Hx    Colon polyps Neg Hx    Rectal cancer Neg Hx    Pancreatic cancer Neg Hx    Stomach cancer Neg Hx     Social History   Socioeconomic History   Marital status: Single    Spouse name: Not on file   Number of children: Not on file   Years of education: Not on file   Highest education level: Not on file  Occupational History   Occupation: unemployed  Tobacco Use   Smoking status: Every Day    Current packs/day: 1.00    Average packs/day: 1 pack/day for 35.0 years (35.0 ttl pk-yrs)    Types: Cigarettes    Passive exposure: Never   Smokeless tobacco: Never  Vaping Use   Vaping status: Never Used  Substance and Sexual Activity   Alcohol use: Yes    Alcohol/week: 6.0 standard drinks of alcohol    Types: 2 Cans of beer, 4 Shots of liquor per week    Comment: socially   Drug use: Not Currently    Types: Cocaine    Comment: 12/20/2016 "might have used some the other day; I'm not sure"   Sexual activity: Not Currently  Other Topics Concern   Not on file  Social History Narrative   Lives in [place].    Social Drivers of Corporate investment banker Strain: Low Risk  (06/21/2022)   Overall Financial Resource Strain (CARDIA)    Difficulty of Paying Living Expenses: Not hard at all  Food Insecurity: No Food Insecurity (03/19/2023)   Hunger Vital Sign    Worried About Running Out of Food in the Last Year: Never true    Ran Out of Food in the Last Year: Never true  Transportation Needs: No Transportation  Needs (03/19/2023)   PRAPARE - Administrator, Civil Service (Medical): No    Lack of Transportation (Non-Medical): No  Physical Activity: Inactive (06/21/2022)   Exercise Vital Sign    Days of Exercise per Week: 0 days    Minutes of Exercise per Session: 0 min  Stress: No Stress Concern Present (06/21/2022)   [employer] of Occupational Health - Occupational Stress Questionnaire    Feeling of Stress : Not at all  Social Connections: Not on file    Allergies  Allergen Reactions   Atorvastatin     myalgia    Outpatient Medications Prior to Visit  Medication Sig Dispense Refill   azelastine (ASTELIN) 0.1 % nasal spray Place 2 sprays into both nostrils 2 (two) times daily as needed for rhinitis. Use in each nostril as directed     cetirizine (ZYRTEC) 10 MG tablet Take 1 tablet (10 mg total) by mouth daily. 90 tablet 1   Evolocumab 140 MG/ML SOAJ Inject 140 mg into the skin every 14 (fourteen) days. 6 mL 3   Fluticasone-Umeclidin-Vilant (TRELEGY ELLIPTA) 100-62.5-25 MCG/ACT AEPB Inhale 1 puff into the lungs daily. 60 each 0   furosemide (LASIX) 40 MG tablet Take 1 tablet (40 mg total) by mouth daily as needed (May take 40mg  Daily for Swelling and Shortness of breath). 90 tablet 1   HYDROcodone-acetaminophen (NORCO) 7.5-325 MG tablet Take 1 tablet by mouth every 6 (six) hours as needed for moderate pain (pain score 4-6). 20 tablet 0   isosorbide mononitrate (IMDUR) 30 MG 24 hr tablet Take 1 tablet (30 mg total) by mouth daily. 90 tablet 1   albuterol (PROAIR HFA) 108  (90 Base) MCG/ACT inhaler Inhale 2 puffs into the lungs every 6 (six) hours as needed for wheezing or shortness of breath. 6.7 g 0   albuterol (PROVENTIL) (2.5 MG/3ML) 0.083% nebulizer solution Take 3 mLs (2.5 mg total) by nebulization every 6 (six) hours as needed for wheezing or shortness of breath. 75 mL 0   apixaban (ELIQUIS) 5 MG TABS tablet Take 1 tablet (5 mg total) by mouth 2 (two) times daily. 60 tablet 0   Azelastine-Fluticasone 137-50 MCG/ACT SUSP Place 2 sprays into the nose every 12 (twelve) hours. 23 g 5   empagliflozin (JARDIANCE) 10 MG TABS tablet Take 1 tablet (10 mg total) by mouth daily before breakfast. 90 tablet 3   ezetimibe (ZETIA) 10 MG tablet TAKE 1 TABLET (10 MG TOTAL) BY MOUTH DAILY. 90 tablet 3   fluticasone (FLONASE) 50 MCG/ACT nasal spray Place 2 sprays into both nostrils daily. (Patient taking differently: Place 2 sprays into both nostrils daily as needed for allergies or rhinitis.) 16 g 5   gabapentin (NEURONTIN) 100 MG capsule Take 1 capsule (100 mg total) by mouth at bedtime. 90 capsule 0   losartan (COZAAR) 25 MG tablet Take 1 tablet (25 mg total) by mouth daily. 90 tablet 3   metoprolol succinate (TOPROL-XL) 25 MG 24 hr tablet Take 25 mg by mouth daily.     montelukast (SINGULAIR) 10 MG tablet Take 1 tablet (10 mg total) by mouth at bedtime. 90 tablet 1   ondansetron (ZOFRAN-ODT) 4 MG disintegrating tablet Take 1 tablet (4 mg total) by mouth every 8 (eight) hours as needed for nausea or vomiting. 20 tablet 0   pantoprazole (PROTONIX) 40 MG tablet Take 1 tablet (40 mg total) by mouth daily. 30 tablet 6   nitroGLYCERIN (NITROSTAT) 0.4 MG SL tablet Place 1 tablet (0.4 mg  total) under the tongue every 5 (five) minutes as needed for chest pain. 25 tablet 3   No facility-administered medications prior to visit.     ROS Review of Systems  Constitutional:  Negative for activity change and appetite change.  HENT:  Negative for sinus pressure and sore throat.    Respiratory:  Negative for chest tightness, shortness of breath and wheezing.   Cardiovascular:  Negative for chest pain and palpitations.  Gastrointestinal:  Negative for abdominal distention, abdominal pain and constipation.  Genitourinary: Negative.   Musculoskeletal:        See HPI  Psychiatric/Behavioral:  Negative for behavioral problems and dysphoric mood.     Objective:  BP 129/80   Pulse 77   Ht 6\' 2"  (1.88 m)   Wt 179 lb 6.4 oz (81.4 kg)   SpO2 99%   BMI 23.03 kg/m      04/19/2023    9:41 AM 03/21/2023    8:34 AM 03/21/2023    5:37 AM  BP/Weight  Systolic BP 129 135 117  Diastolic BP 80 85 60  Wt. (Lbs) 179.4    BMI 23.03 kg/m2        Physical Exam Constitutional:      Appearance: He is well-developed.  Eyes:     Comments: Mass in inner aspect right upper eyelid  Cardiovascular:     Rate and Rhythm: Normal rate.     Heart sounds: Normal heart sounds. No murmur heard. Pulmonary:     Effort: Pulmonary effort is normal.     Breath sounds: Normal breath sounds. No wheezing or rales.  Chest:     Chest wall: No tenderness.  Abdominal:     General: Bowel sounds are normal. There is no distension.     Palpations: Abdomen is soft. There is no mass.     Tenderness: There is no abdominal tenderness.  Musculoskeletal:     Right lower leg: No edema.     Left lower leg: No edema.     Comments: Difficulty rising from a seated position No tenderness on palpation of back  Neurological:     Mental Status: He is alert and oriented to person, place, and time.     Comments: Normal strength in bilateral lower extremity Normal sensation in lower extremities  Psychiatric:        Mood and Affect: Mood normal.        Latest Ref Rng & Units 03/19/2023    1:29 PM 01/11/2023    8:51 AM 01/05/2023   10:14 AM  CMP  Glucose 70 - 99 mg/dL 161   83   BUN 8 - 23 mg/dL 11   12   Creatinine 0.96 - 1.24 mg/dL 0.45   4.09   Sodium 811 - 145 mmol/L 136   137   Potassium  3.5 - 5.1 mmol/L 4.3  3.9  CANCELED   Chloride 98 - 111 mmol/L 100   104   CO2 22 - 32 mmol/L 24   25   Calcium 8.9 - 10.3 mg/dL 9.4   9.3   Total Protein 6.5 - 8.1 g/dL 8.2     Total Bilirubin <1.2 mg/dL 0.8     Alkaline Phos 38 - 126 U/L 91     AST 15 - 41 U/L 31     ALT 0 - 44 U/L 23       Lipid Panel     Component Value Date/Time   CHOL 218 (H) 12/07/2021 0908  TRIG 99 12/07/2021 0908   HDL 61 12/07/2021 0908   CHOLHDL 2.4 08/13/2020 1105   CHOLHDL 3.1 01/01/2015 0208   VLDL 12 01/01/2015 0208   LDLCALC 139 (H) 12/07/2021 0908    CBC    Component Value Date/Time   WBC 5.1 03/19/2023 1329   RBC 4.92 03/19/2023 1329   HGB 15.7 03/19/2023 1329   HGB 13.9 01/05/2023 1014   HCT 47.3 03/19/2023 1329   HCT 42.5 01/05/2023 1014   PLT 144 (L) 03/19/2023 1329   PLT 157 01/05/2023 1014   MCV 96.1 03/19/2023 1329   MCV 98 (H) 01/05/2023 1014   MCH 31.9 03/19/2023 1329   MCHC 33.2 03/19/2023 1329   RDW 14.1 03/19/2023 1329   RDW 13.7 01/05/2023 1014   LYMPHSABS 1.8 10/05/2022 1118   MONOABS 0.4 09/06/2021 0935   EOSABS 0.5 (H) 10/05/2022 1118   BASOSABS 0.0 10/05/2022 1118    Lab Results  Component Value Date   HGBA1C 5.8 (H) 12/07/2021    Assessment & Plan:      Lower extremity weakness Progressive weakness in legs, difficulty with mobility and activities of daily living. History of lumbar spinal stenosis and radiculopathy status post lumbar spinal fusion. Physical therapy not significantly beneficial. -Refer to neurologist for further evaluation and possible EMG/NCS  Eye swelling Noted swelling in the eye, possibly a cyst or mass. -Maxillofacial CT ordered -Refer to ophthalmologist for further evaluation.  COPD/ Asthma Stable with no flares Continue inhalers Advised that smoking cessation will be beneficial.  CAD -Recent cardiac cath revealed stable two-vessel CAD -Continue medical management per cardiology -Risk factor modification -Continue  Repatha   Tobacco use Reports smoking 5-10 cigarettes daily for over 40 years. Expressed interest in quitting. -Prescribe Wellbutrin to help decrease cravings for cigarettes.   Recurrent DVT and PE -On lifelong anticoagulation with Eliquis  Colon cancer screening Due for colon cancer screening. Patient prefers colonoscopy over stool test. -Refer for colonoscopy.  Lung cancer screening Due for lung cancer screening. -Order lung cancer screening.  Medication refills Out of Albuterol, Eliquis, nasal spray, and Jardiance. -Refill Albuterol, Eliquis, nasal spray, and Jardiance.  Pneumonia vaccination Due for pneumonia vaccination. -Administer pneumonia vaccine.          Meds ordered this encounter  Medications   buPROPion (WELLBUTRIN XL) 150 MG 24 hr tablet    Sig: Take 1 tablet (150 mg total) by mouth daily. For smoking cessation.    Dispense:  90 tablet    Refill:  1   albuterol (PROAIR HFA) 108 (90 Base) MCG/ACT inhaler    Sig: Inhale 2 puffs into the lungs every 6 (six) hours as needed for wheezing or shortness of breath.    Dispense:  6.7 g    Refill:  6   albuterol (PROVENTIL) (2.5 MG/3ML) 0.083% nebulizer solution    Sig: Take 3 mLs (2.5 mg total) by nebulization every 6 (six) hours as needed for wheezing or shortness of breath.    Dispense:  90 mL    Refill:  6   apixaban (ELIQUIS) 5 MG TABS tablet    Sig: Take 1 tablet (5 mg total) by mouth 2 (two) times daily.    Dispense:  180 tablet    Refill:  1   Azelastine-Fluticasone 137-50 MCG/ACT SUSP    Sig: Place 2 sprays into the nose every 12 (twelve) hours.    Dispense:  23 g    Refill:  5   empagliflozin (JARDIANCE) 10 MG TABS tablet  Sig: Take 1 tablet (10 mg total) by mouth daily before breakfast.    Dispense:  90 tablet    Refill:  3   ezetimibe (ZETIA) 10 MG tablet    Sig: Take 1 tablet (10 mg total) by mouth daily.    Dispense:  90 tablet    Refill:  3   gabapentin (NEURONTIN) 100 MG capsule     Sig: Take 1 capsule (100 mg total) by mouth at bedtime.    Dispense:  90 capsule    Refill:  1   losartan (COZAAR) 25 MG tablet    Sig: Take 1 tablet (25 mg total) by mouth daily.    Dispense:  90 tablet    Refill:  3   metoprolol succinate (TOPROL-XL) 25 MG 24 hr tablet    Sig: Take 1 tablet (25 mg total) by mouth daily.    Dispense:  90 tablet    Refill:  1   montelukast (SINGULAIR) 10 MG tablet    Sig: Take 1 tablet (10 mg total) by mouth at bedtime.    Dispense:  90 tablet    Refill:  1   ondansetron (ZOFRAN-ODT) 4 MG disintegrating tablet    Sig: Take 1 tablet (4 mg total) by mouth every 8 (eight) hours as needed for nausea or vomiting.    Dispense:  20 tablet    Refill:  0   pantoprazole (PROTONIX) 40 MG tablet    Sig: Take 1 tablet (40 mg total) by mouth daily.    Dispense:  90 tablet    Refill:  1    Follow-up: Return in about 3 months (around 07/18/2023) for Chronic medical conditions.       Hoy Register, MD, FAAFP. [facility] [place], [place] [phone]   04/19/2023, 11:25 AM

## 2023-04-19 NOTE — Patient Instructions (Signed)
VISIT SUMMARY:  During today's visit, we discussed your ongoing leg weakness, eye swelling, smoking habits, and necessary health screenings. We also addressed your medication refills and administered a pneumonia vaccine.  YOUR PLAN:  -LOWER EXTREMITY WEAKNESS: You are experiencing worsening weakness in your legs, making it difficult to perform daily activities. This may be related to your previous back surgery. We will refer you to a neurologist for further evaluation and possible nerve tests.  -EYE SWELLING: You have swelling in your eye, which could be a cyst or mass. We will refer you to an eye specialist for further evaluation.  I also ordered a CT scan for better evaluation.  -TOBACCO USE: You have a long history of smoking and currently smoke about five cigarettes a day. You expressed a desire to quit, so we will prescribe Wellbutrin to help reduce your cravings.  -COLON CANCER SCREENING: You are due for a colon cancer screening and prefer a colonoscopy. We will refer you for this procedure.  -LUNG CANCER SCREENING: You are due for a lung cancer screening. We will order this screening for you.  -MEDICATION REFILLS: You need refills for Albuterol, Eliquis, nasal spray, and Jardiance. We have refilled these medications for you.  -PNEUMONIA VACCINATION: You are due for a pneumonia vaccination. We administered this vaccine during your visit.  INSTRUCTIONS:  Please follow up with the neurologist and ophthalmologist as soon as possible. Schedule your colonoscopy and lung cancer screening. Continue taking your medications as prescribed. If you have any questions or concerns, do not hesitate to contact our office.

## 2023-05-01 ENCOUNTER — Other Ambulatory Visit: Payer: Self-pay

## 2023-05-02 ENCOUNTER — Encounter: Payer: Self-pay | Admitting: Podiatry

## 2023-05-02 ENCOUNTER — Ambulatory Visit (INDEPENDENT_AMBULATORY_CARE_PROVIDER_SITE_OTHER): Payer: No Typology Code available for payment source | Admitting: Podiatry

## 2023-05-02 DIAGNOSIS — M79674 Pain in right toe(s): Secondary | ICD-10-CM | POA: Diagnosis not present

## 2023-05-02 DIAGNOSIS — M79675 Pain in left toe(s): Secondary | ICD-10-CM | POA: Diagnosis not present

## 2023-05-02 DIAGNOSIS — B351 Tinea unguium: Secondary | ICD-10-CM

## 2023-05-02 DIAGNOSIS — Z7901 Long term (current) use of anticoagulants: Secondary | ICD-10-CM

## 2023-05-02 NOTE — Progress Notes (Signed)
This patient returns to my office for at risk foot care.  This patient requires this care by a professional since this patient will be at risk due to having coagulation defect. Patient is taking eliquis.  This patient is unable to cut nails himself since the patient cannot reach his nails.These nails are painful walking and wearing shoes.  This patient presents for at risk foot care today.  General Appearance  Alert, conversant and in no acute stress.  Vascular  Dorsalis pedis and posterior tibial  pulses are palpable  bilaterally.  Capillary return is within normal limits  bilaterally. Temperature is within normal limits  bilaterally.  Neurologic  Senn-Weinstein monofilament wire test within normal limits  bilaterally. Muscle power within normal limits bilaterally.  Nails Thick disfigured discolored nails with subungual debris  from hallux to fifth toes bilaterally. No evidence of bacterial infection or drainage bilaterally.  Orthopedic  No limitations of motion  feet .  No crepitus or effusions noted.  No bony pathology or digital deformities noted.  Skin  normotropic skin with no porokeratosis noted bilaterally.  No signs of infections or ulcers noted.     Onychomycosis  Pain in right toes  Pain in left toes  Consent was obtained for treatment procedures.   Mechanical debridement of nails 1-5  bilaterally performed with a nail nipper.  Filed with dremel without incident.    Return office visit    3    months                  Told patient to return for periodic foot care and evaluation due to potential at risk complications.   Gardiner Barefoot DPM

## 2023-05-07 ENCOUNTER — Other Ambulatory Visit (HOSPITAL_COMMUNITY): Payer: Self-pay

## 2023-05-07 ENCOUNTER — Other Ambulatory Visit: Payer: Self-pay

## 2023-05-08 ENCOUNTER — Other Ambulatory Visit (HOSPITAL_COMMUNITY): Payer: Self-pay

## 2023-05-08 ENCOUNTER — Encounter: Payer: Self-pay | Admitting: Gastroenterology

## 2023-05-09 DIAGNOSIS — Z79899 Other long term (current) drug therapy: Secondary | ICD-10-CM | POA: Diagnosis not present

## 2023-05-16 ENCOUNTER — Inpatient Hospital Stay: Admission: RE | Admit: 2023-05-16 | Payer: No Typology Code available for payment source | Source: Ambulatory Visit

## 2023-05-22 ENCOUNTER — Emergency Department (HOSPITAL_COMMUNITY): Payer: No Typology Code available for payment source

## 2023-05-22 ENCOUNTER — Emergency Department (HOSPITAL_COMMUNITY)
Admission: EM | Admit: 2023-05-22 | Discharge: 2023-05-23 | Disposition: A | Discharge status: Home / Self Care | Payer: No Typology Code available for payment source | Attending: Emergency Medicine | Admitting: Emergency Medicine

## 2023-05-22 ENCOUNTER — Encounter (HOSPITAL_COMMUNITY): Payer: Self-pay

## 2023-05-22 ENCOUNTER — Other Ambulatory Visit: Payer: Self-pay

## 2023-05-22 DIAGNOSIS — I6782 Cerebral ischemia: Secondary | ICD-10-CM | POA: Diagnosis not present

## 2023-05-22 DIAGNOSIS — S0990XA Unspecified injury of head, initial encounter: Secondary | ICD-10-CM | POA: Diagnosis not present

## 2023-05-22 DIAGNOSIS — E876 Hypokalemia: Secondary | ICD-10-CM | POA: Insufficient documentation

## 2023-05-22 DIAGNOSIS — R000 Tachycardia, unspecified: Secondary | ICD-10-CM | POA: Diagnosis not present

## 2023-05-22 DIAGNOSIS — R55 Syncope and collapse: Secondary | ICD-10-CM | POA: Diagnosis not present

## 2023-05-22 DIAGNOSIS — R0602 Shortness of breath: Secondary | ICD-10-CM | POA: Diagnosis not present

## 2023-05-22 DIAGNOSIS — R918 Other nonspecific abnormal finding of lung field: Secondary | ICD-10-CM | POA: Diagnosis not present

## 2023-05-22 DIAGNOSIS — I959 Hypotension, unspecified: Secondary | ICD-10-CM | POA: Diagnosis not present

## 2023-05-22 DIAGNOSIS — S199XXA Unspecified injury of neck, initial encounter: Secondary | ICD-10-CM | POA: Diagnosis not present

## 2023-05-22 DIAGNOSIS — Z7901 Long term (current) use of anticoagulants: Secondary | ICD-10-CM | POA: Insufficient documentation

## 2023-05-22 DIAGNOSIS — R42 Dizziness and giddiness: Secondary | ICD-10-CM | POA: Diagnosis not present

## 2023-05-22 DIAGNOSIS — R0902 Hypoxemia: Secondary | ICD-10-CM | POA: Diagnosis not present

## 2023-05-22 DIAGNOSIS — R531 Weakness: Secondary | ICD-10-CM | POA: Diagnosis not present

## 2023-05-22 LAB — CBC WITH DIFFERENTIAL/PLATELET
Abs Immature Granulocytes: 0.08 10*3/uL — ABNORMAL HIGH (ref 0.00–0.07)
Basophils Absolute: 0 10*3/uL (ref 0.0–0.1)
Basophils Relative: 1 %
Eosinophils Absolute: 0.3 10*3/uL (ref 0.0–0.5)
Eosinophils Relative: 6 %
HCT: 42.9 % (ref 39.0–52.0)
Hemoglobin: 14.2 g/dL (ref 13.0–17.0)
Immature Granulocytes: 1 %
Lymphocytes Relative: 37 %
Lymphs Abs: 2.1 10*3/uL (ref 0.7–4.0)
MCH: 31.8 pg (ref 26.0–34.0)
MCHC: 33.1 g/dL (ref 30.0–36.0)
MCV: 96.2 fL (ref 80.0–100.0)
Monocytes Absolute: 0.6 10*3/uL (ref 0.1–1.0)
Monocytes Relative: 11 %
Neutro Abs: 2.6 10*3/uL (ref 1.7–7.7)
Neutrophils Relative %: 44 %
Platelets: 251 10*3/uL (ref 150–400)
RBC: 4.46 MIL/uL (ref 4.22–5.81)
RDW: 14.1 % (ref 11.5–15.5)
WBC: 5.7 10*3/uL (ref 4.0–10.5)
nRBC: 0 % (ref 0.0–0.2)

## 2023-05-22 LAB — MAGNESIUM: Magnesium: 1.6 mg/dL — ABNORMAL LOW (ref 1.7–2.4)

## 2023-05-22 LAB — BASIC METABOLIC PANEL
Anion gap: 8 (ref 5–15)
BUN: 10 mg/dL (ref 8–23)
CO2: 22 mmol/L (ref 22–32)
Calcium: 8.8 mg/dL — ABNORMAL LOW (ref 8.9–10.3)
Chloride: 108 mmol/L (ref 98–111)
Creatinine, Ser: 0.73 mg/dL (ref 0.61–1.24)
GFR, Estimated: >60 mL/min (ref >60)
Glucose, Bld: 110 mg/dL — ABNORMAL HIGH (ref 70–99)
Potassium: 2.9 mmol/L — ABNORMAL LOW (ref 3.5–5.1)
Sodium: 138 mmol/L (ref 135–145)

## 2023-05-22 LAB — CBG MONITORING, ED: Glucose-Capillary: 103 mg/dL — ABNORMAL HIGH (ref 70–99)

## 2023-05-22 MED ORDER — POTASSIUM CHLORIDE 10 MEQ/100ML IV SOLN
10.0000 meq | Freq: Once | INTRAVENOUS | Status: AC
  Administered 2023-05-22: 10 meq via INTRAVENOUS
  Filled 2023-05-22: qty 100

## 2023-05-22 MED ORDER — POTASSIUM CHLORIDE CRYS ER 20 MEQ PO TBCR
20.0000 meq | EXTENDED_RELEASE_TABLET | Freq: Every day | ORAL | 0 refills | Status: DC
  Filled 2023-05-22 – 2023-05-23 (×3): qty 5, 5d supply, fill #0

## 2023-05-22 MED ORDER — POTASSIUM CHLORIDE CRYS ER 20 MEQ PO TBCR
40.0000 meq | EXTENDED_RELEASE_TABLET | Freq: Once | ORAL | Status: AC
  Administered 2023-05-22: 40 meq via ORAL
  Filled 2023-05-22: qty 2

## 2023-05-22 MED ORDER — MAGNESIUM CHLORIDE 64 MG PO TBEC
1.0000 | DELAYED_RELEASE_TABLET | Freq: Once | ORAL | Status: AC
  Administered 2023-05-23: 64 mg via ORAL
  Filled 2023-05-22: qty 1

## 2023-05-22 NOTE — ED Provider Triage Note (Signed)
 Emergency Medicine Provider Triage Evaluation Note  [name] , a 63 y.o. male  was evaluated in triage.  Pt complains of fall.  Occurred this morning.  Patient states he felt lightheaded and then fell.  Patient reports he is on Eliquis.  States he fell and caught himself with his hands outstretched but did hit his head.  Has pain in the left temporal region.  Denies any neck pain.  Endorses some shortness of breath but no chest pain or palpitations.  Review of Systems  Positive: See above Negative: See above  Physical Exam  BP 91/61   Pulse 78   Temp 97.8 F (36.6 C) (Oral)   Resp 20   Ht 6\' 2"  (1.88 m)   Wt 77.1 kg   SpO2 95%   BMI 21.83 kg/m  Gen:   Awake, no distress   Resp:  Normal effort  MSK:   Moves extremities without difficulty  Other:    Medical Decision Making  Medically screening exam initiated at 9:37 AM.  Appropriate orders placed.  DANI DANIS was informed that the remainder of the evaluation will be completed by another provider, this initial triage assessment does not replace that evaluation, and the importance of remaining in the ED until their evaluation is complete.  Work upstarted   Roxan Hockey, Sharlett Iles, PA-C 05/22/23 [phone]

## 2023-05-22 NOTE — ED Triage Notes (Signed)
 Pt BIB EMS due to multiple falls. Pt fell yesterday and hit head, pt on elliquis. EMS states BP was 90 systolic. Axox4. No injuries noted.

## 2023-05-22 NOTE — ED Notes (Signed)
 Report given to Wonda Horner.

## 2023-05-22 NOTE — Discharge Instructions (Signed)
 You have been seen and discharged from the emergency department.  You were found to have low potassium.  This could be because of decreased oral intake or some of the medications on your list.  Follow-up with your primary doctor for review of these medications.  You were given replacement potassium here in the department.  Fill prescription and take potassium pill for the next couple days.  Follow-up with your primary provider for further evaluation and further care. Take home medications as prescribed. If you have any worsening symptoms or further concerns for your health please return to an emergency department for further evaluation.

## 2023-05-22 NOTE — ED Provider Notes (Signed)
 [place] EMERGENCY DEPARTMENT AT [facility] Provider Note   CSN: [id number] Arrival date & time: 05/22/23  4098     History  Chief Complaint  Patient presents with   Fall   Dizziness    [name] is a 63 y.o. male.  HPI   63 year old male presents emergency department with generalized weakness, multiple falls, admits to hitting his head yesterday, he is anticoagulated on Eliquis.  Denies any other acute symptoms or changes in health.  Admits to decreased appetite and p.o. intake.  Home Medications Prior to Admission medications   Medication Sig Start Date End Date Taking? Authorizing Provider  albuterol (PROAIR HFA) 108 (90 Base) MCG/ACT inhaler Inhale 2 puffs into the lungs every 6 (six) hours as needed for wheezing or shortness of breath. 04/19/23   Hoy Register, MD  albuterol (PROVENTIL) (2.5 MG/3ML) 0.083% nebulizer solution Take 3 mLs (2.5 mg total) by nebulization every 6 (six) hours as needed for wheezing or shortness of breath. 04/19/23   Hoy Register, MD  apixaban (ELIQUIS) 5 MG TABS tablet Take 1 tablet (5 mg total) by mouth 2 (two) times daily. 04/19/23   Hoy Register, MD  azelastine (ASTELIN) 0.1 % nasal spray Place 2 sprays into both nostrils 2 (two) times daily as needed for rhinitis. Use in each nostril as directed    [provider]  Azelastine-Fluticasone 137-50 MCG/ACT SUSP Place 2 sprays into the nose every 12 (twelve) hours. 04/19/23   Hoy Register, MD  buPROPion (WELLBUTRIN XL) 150 MG 24 hr tablet Take 1 tablet (150 mg total) by mouth daily. For smoking cessation. 04/19/23   Hoy Register, MD  cetirizine (ZYRTEC) 10 MG tablet Take 1 tablet (10 mg total) by mouth daily. 06/07/22   Verlee Monte, MD  empagliflozin (JARDIANCE) 10 MG TABS tablet Take 1 tablet (10 mg total) by mouth daily before breakfast. 04/19/23   Hoy Register, MD  Evolocumab 140 MG/ML SOAJ Inject 140 mg into the skin every 14 (fourteen) days. 12/28/22    Azalee Course, PA  ezetimibe (ZETIA) 10 MG tablet Take 1 tablet (10 mg total) by mouth daily. 04/19/23   Hoy Register, MD  Fluticasone-Umeclidin-Vilant (TRELEGY ELLIPTA) 100-62.5-25 MCG/ACT AEPB Inhale 1 puff into the lungs daily. 03/29/23   Hoy Register, MD  furosemide (LASIX) 40 MG tablet Take 1 tablet (40 mg total) by mouth daily as needed (May take 40mg  Daily for Swelling and Shortness of breath). 06/14/22   Parke Poisson, MD  gabapentin (NEURONTIN) 100 MG capsule Take 1 capsule (100 mg total) by mouth at bedtime. 04/19/23   Hoy Register, MD  HYDROcodone-acetaminophen (NORCO) 7.5-325 MG tablet Take 1 tablet by mouth every 6 (six) hours as needed for moderate pain (pain score 4-6). 03/20/23   Serena Colonel, MD  isosorbide mononitrate (IMDUR) 30 MG 24 hr tablet Take 1 tablet (30 mg total) by mouth daily. 06/14/22   Parke Poisson, MD  losartan (COZAAR) 25 MG tablet Take 1 tablet (25 mg total) by mouth daily. 04/19/23   Hoy Register, MD  metoprolol succinate (TOPROL-XL) 25 MG 24 hr tablet Take 1 tablet (25 mg total) by mouth daily. 04/19/23   Hoy Register, MD  montelukast (SINGULAIR) 10 MG tablet Take 1 tablet (10 mg total) by mouth at bedtime. 04/19/23   Hoy Register, MD  nitroGLYCERIN (NITROSTAT) 0.4 MG SL tablet Place 1 tablet (0.4 mg total) under the tongue every 5 (five) minutes as needed for chest pain. 06/14/22 03/15/23  Weston Brass  A, MD  ondansetron (ZOFRAN-ODT) 4 MG disintegrating tablet Take 1 tablet (4 mg total) by mouth every 8 (eight) hours as needed for nausea or vomiting. 04/19/23   Hoy Register, MD  pantoprazole (PROTONIX) 40 MG tablet Take 1 tablet (40 mg total) by mouth daily. 04/19/23   Hoy Register, MD      Allergies    Atorvastatin    Review of Systems   Review of Systems  Constitutional:  Positive for appetite change and fatigue. Negative for fever.  Respiratory:  Negative for shortness of breath.   Cardiovascular:  Negative for chest pain.   Gastrointestinal:  Negative for abdominal pain, diarrhea and vomiting.  Skin:  Negative for rash.  Neurological:  Negative for headaches.    Physical Exam Updated Vital Signs BP 111/80   Pulse 88   Temp 97.7 F (36.5 C)   Resp 18   Ht 6\' 2"  (1.88 m)   Wt 77.1 kg   SpO2 95%   BMI 21.83 kg/m  Physical Exam Vitals and nursing note reviewed.  Constitutional:      Appearance: Normal appearance.  HENT:     Head: Normocephalic.     Mouth/Throat:     Mouth: Mucous membranes are moist.  Eyes:     Pupils: Pupils are equal, round, and reactive to light.     Comments: Swelling around the right eyelid  Cardiovascular:     Rate and Rhythm: Normal rate.  Pulmonary:     Effort: Pulmonary effort is normal. No respiratory distress.  Abdominal:     Palpations: Abdomen is soft.     Tenderness: There is no abdominal tenderness.  Musculoskeletal:        General: No deformity or signs of injury.  Skin:    General: Skin is warm.  Neurological:     Mental Status: He is alert and oriented to person, place, and time. Mental status is at baseline.  Psychiatric:        Mood and Affect: Mood normal.     ED Results / Procedures / Treatments   Labs (all labs ordered are listed, but only abnormal results are displayed) Labs Reviewed  BASIC METABOLIC PANEL - Abnormal; Notable for the following components:      Result Value   Potassium 2.9 (*)    Glucose, Bld 110 (*)    Calcium 8.8 (*)    All other components within normal limits  CBC WITH DIFFERENTIAL/PLATELET - Abnormal; Notable for the following components:   Abs Immature Granulocytes 0.08 (*)    All other components within normal limits  CBG MONITORING, ED - Abnormal; Notable for the following components:   Glucose-Capillary 103 (*)    All other components within normal limits  MAGNESIUM    EKG None  Radiology DG Chest 2 View Result Date: 05/22/2023 CLINICAL DATA:  Shortness of breath and syncope. EXAM: CHEST - 2 VIEW  COMPARISON:  Chest radiograph dated 09/13/2019. FINDINGS: Faint interstitial and peribronchial densities may represent reactive airway disease or viral infection. No focal consolidation, pleural effusion, or pneumothorax. The cardiac silhouette is within normal limits. No acute osseous pathology. Degenerative changes of the spine. IMPRESSION: No focal consolidation. Findings may represent reactive airway disease or viral infection. Electronically Signed   By: Elgie Collard M.D.   On: 05/22/2023 11:20   CT HEAD WO CONTRAST Result Date: 05/22/2023 CLINICAL DATA:  Polytrauma, blunt EXAM: CT HEAD WITHOUT CONTRAST CT CERVICAL SPINE WITHOUT CONTRAST TECHNIQUE: Multidetector CT imaging of the head and cervical  spine was performed following the standard protocol without intravenous contrast. Multiplanar CT image reconstructions of the cervical spine were also generated. RADIATION DOSE REDUCTION: This exam was performed according to the departmental dose-optimization program which includes automated exposure control, adjustment of the mA and/or kV according to patient size and/or use of iterative reconstruction technique. COMPARISON:  None Available. FINDINGS: CT HEAD FINDINGS Brain: No hemorrhage. No hydrocephalus. No extra-axial fluid collection. No mass effect. No mass lesion. There is background of moderate chronic microvascular ischemic change. Vascular: No hyperdense vessel or unexpected calcification. Skull: Normal. Negative for fracture or focal lesion. Sinuses/Orbits: No middle ear or mastoid effusion. There are air-fluid levels in the bilateral maxillary sinuses, which can be seen in the setting of acute sinusitis. Other: There is asymmetric thickening of the lacrimal gland on the right. This is nonspecific and could be posttraumatic. Correlate with mechanism trauma. CT CERVICAL SPINE FINDINGS Alignment: Mild retrolisthesis of C3 on C4 and C4 on C5 Skull base and vertebrae: No acute fracture. No primary bone  lesion or focal pathologic process. Soft tissues and spinal canal: No prevertebral fluid or swelling. No visible canal hematoma. Disc levels:  No evidence of high-grade spinal canal stenosis Upper chest: Negative. Other: None IMPRESSION: 1. No acute intracranial abnormality. 2. No acute fracture or traumatic subluxation of the cervical spine. 3. Asymmetric enlargement of the lacrimal gland on the right. This is nonspecific and could be posttraumatic. Correlate with mechanism of trauma. If this is not favored to be posttraumatic, ophthalmologic consultation is recommended for further evaluation 4. Air-fluid levels in the bilateral maxillary sinuses, which can be seen in the setting of acute sinusitis. Electronically Signed   By: Lorenza Cambridge M.D.   On: 05/22/2023 11:01   CT CERVICAL SPINE WO CONTRAST Result Date: 05/22/2023 CLINICAL DATA:  Polytrauma, blunt EXAM: CT HEAD WITHOUT CONTRAST CT CERVICAL SPINE WITHOUT CONTRAST TECHNIQUE: Multidetector CT imaging of the head and cervical spine was performed following the standard protocol without intravenous contrast. Multiplanar CT image reconstructions of the cervical spine were also generated. RADIATION DOSE REDUCTION: This exam was performed according to the departmental dose-optimization program which includes automated exposure control, adjustment of the mA and/or kV according to patient size and/or use of iterative reconstruction technique. COMPARISON:  None Available. FINDINGS: CT HEAD FINDINGS Brain: No hemorrhage. No hydrocephalus. No extra-axial fluid collection. No mass effect. No mass lesion. There is background of moderate chronic microvascular ischemic change. Vascular: No hyperdense vessel or unexpected calcification. Skull: Normal. Negative for fracture or focal lesion. Sinuses/Orbits: No middle ear or mastoid effusion. There are air-fluid levels in the bilateral maxillary sinuses, which can be seen in the setting of acute sinusitis. Other: There is  asymmetric thickening of the lacrimal gland on the right. This is nonspecific and could be posttraumatic. Correlate with mechanism trauma. CT CERVICAL SPINE FINDINGS Alignment: Mild retrolisthesis of C3 on C4 and C4 on C5 Skull base and vertebrae: No acute fracture. No primary bone lesion or focal pathologic process. Soft tissues and spinal canal: No prevertebral fluid or swelling. No visible canal hematoma. Disc levels:  No evidence of high-grade spinal canal stenosis Upper chest: Negative. Other: None IMPRESSION: 1. No acute intracranial abnormality. 2. No acute fracture or traumatic subluxation of the cervical spine. 3. Asymmetric enlargement of the lacrimal gland on the right. This is nonspecific and could be posttraumatic. Correlate with mechanism of trauma. If this is not favored to be posttraumatic, ophthalmologic consultation is recommended for further evaluation 4. Air-fluid levels in  the bilateral maxillary sinuses, which can be seen in the setting of acute sinusitis. Electronically Signed   By: Lorenza Cambridge M.D.   On: 05/22/2023 11:01    Procedures .Critical Care  Performed by: Rozelle Logan, DO Authorized by: Rozelle Logan, DO   Critical care provider statement:    Critical care time (minutes):  30   Critical care time was exclusive of:  Separately billable procedures and treating other patients   Critical care was necessary to treat or prevent imminent or life-threatening deterioration of the following conditions:  Metabolic crisis   Critical care was time spent personally by me on the following activities:  Development of treatment plan with patient or surrogate, discussions with consultants, evaluation of patient's response to treatment, examination of patient, ordering and review of laboratory studies, ordering and review of radiographic studies, ordering and performing treatments and interventions, pulse oximetry, re-evaluation of patient's condition and review of old charts    I assumed direction of critical care for this patient from another provider in my specialty: no     Care discussed with: admitting provider       Medications Ordered in ED Medications  potassium chloride SA (KLOR-CON M) CR tablet 40 mEq (has no administration in time range)  potassium chloride 10 mEq in 100 mL IVPB (has no administration in time range)    ED Course/ Medical Decision Making/ A&P                                 Medical Decision Making Amount and/or Complexity of Data Reviewed Labs: ordered.  Risk OTC drugs. Prescription drug management.   63 year old male presents emergency department with generalized weakness, fall yesterday with head injury, on anticoagulation.  Vitals are normal and stable, he is neuro baseline.  CT of the head shows no acute bleed.  There is mention of asymmetrical swelling of the right lacrimal gland.  This is known to the patient he is following as an outpatient, he has had a previous seizure on the left side for the same diagnosis and is pending outpatient procedure for the right side.  CT of the cervical spine is unremarkable and x-ray imaging is negative.  Blood work shows mild hypokalemia at 2.9, magnesium is 1.6.  EKG shows a slight prolonged QTc however this is baseline for the patient.  Patient tolerated IV and oral replacement of these electrolytes.  With no other acute findings or illness I do not feel he requires emergent admission at this time.  Will prescribe further potassium replacement and have him follow-up as an outpatient for reevaluation of medications, specifically Lasix.  Patient at this time appears safe and stable for discharge and close outpatient follow up. Discharge plan and strict return to ED precautions discussed, patient verbalizes understanding and agreement.        Final Clinical Impression(s) / ED Diagnoses Final diagnoses:  None    Rx / DC Orders ED Discharge Orders     None         Rozelle Logan, DO 05/22/23 2354

## 2023-05-23 ENCOUNTER — Other Ambulatory Visit (HOSPITAL_COMMUNITY): Payer: Self-pay

## 2023-05-23 ENCOUNTER — Other Ambulatory Visit: Payer: Self-pay

## 2023-05-23 DIAGNOSIS — E876 Hypokalemia: Secondary | ICD-10-CM | POA: Diagnosis not present

## 2023-06-01 ENCOUNTER — Other Ambulatory Visit: Payer: Self-pay

## 2023-06-14 ENCOUNTER — Other Ambulatory Visit: Payer: No Typology Code available for payment source

## 2023-06-20 ENCOUNTER — Other Ambulatory Visit (HOSPITAL_COMMUNITY): Payer: Self-pay

## 2023-06-20 ENCOUNTER — Other Ambulatory Visit: Payer: Self-pay

## 2023-06-21 ENCOUNTER — Other Ambulatory Visit: Payer: Self-pay

## 2023-06-21 ENCOUNTER — Other Ambulatory Visit (HOSPITAL_COMMUNITY): Payer: Self-pay

## 2023-06-26 ENCOUNTER — Other Ambulatory Visit: Payer: Self-pay

## 2023-07-10 ENCOUNTER — Other Ambulatory Visit

## 2023-07-19 ENCOUNTER — Encounter (HOSPITAL_COMMUNITY): Payer: Self-pay

## 2023-07-19 ENCOUNTER — Ambulatory Visit: Payer: No Typology Code available for payment source | Attending: Family Medicine | Admitting: Family Medicine

## 2023-07-19 ENCOUNTER — Emergency Department (HOSPITAL_COMMUNITY)

## 2023-07-19 ENCOUNTER — Other Ambulatory Visit: Payer: Self-pay

## 2023-07-19 ENCOUNTER — Encounter: Payer: Self-pay | Admitting: Family Medicine

## 2023-07-19 ENCOUNTER — Emergency Department (HOSPITAL_COMMUNITY)
Admission: EM | Admit: 2023-07-19 | Discharge: 2023-07-19 | Disposition: A | Discharge status: Home / Self Care | Attending: Emergency Medicine | Admitting: Emergency Medicine

## 2023-07-19 ENCOUNTER — Telehealth: Payer: Self-pay

## 2023-07-19 VITALS — BP 116/73 | HR 85 | Ht 74.0 in | Wt 160.2 lb

## 2023-07-19 DIAGNOSIS — S022XXA Fracture of nasal bones, initial encounter for closed fracture: Secondary | ICD-10-CM | POA: Diagnosis not present

## 2023-07-19 DIAGNOSIS — I110 Hypertensive heart disease with heart failure: Secondary | ICD-10-CM

## 2023-07-19 DIAGNOSIS — R220 Localized swelling, mass and lump, head: Secondary | ICD-10-CM | POA: Diagnosis not present

## 2023-07-19 DIAGNOSIS — S0990XA Unspecified injury of head, initial encounter: Secondary | ICD-10-CM | POA: Diagnosis not present

## 2023-07-19 DIAGNOSIS — M545 Low back pain, unspecified: Secondary | ICD-10-CM | POA: Diagnosis not present

## 2023-07-19 DIAGNOSIS — R7303 Prediabetes: Secondary | ICD-10-CM | POA: Diagnosis not present

## 2023-07-19 DIAGNOSIS — I5042 Chronic combined systolic (congestive) and diastolic (congestive) heart failure: Secondary | ICD-10-CM | POA: Diagnosis not present

## 2023-07-19 DIAGNOSIS — W19XXXA Unspecified fall, initial encounter: Secondary | ICD-10-CM

## 2023-07-19 DIAGNOSIS — F17210 Nicotine dependence, cigarettes, uncomplicated: Secondary | ICD-10-CM | POA: Diagnosis not present

## 2023-07-19 DIAGNOSIS — R238 Other skin changes: Secondary | ICD-10-CM

## 2023-07-19 DIAGNOSIS — Z7901 Long term (current) use of anticoagulants: Secondary | ICD-10-CM | POA: Diagnosis not present

## 2023-07-19 DIAGNOSIS — W01198A Fall on same level from slipping, tripping and stumbling with subsequent striking against other object, initial encounter: Secondary | ICD-10-CM | POA: Insufficient documentation

## 2023-07-19 DIAGNOSIS — S0003XA Contusion of scalp, initial encounter: Secondary | ICD-10-CM

## 2023-07-19 DIAGNOSIS — R634 Abnormal weight loss: Secondary | ICD-10-CM | POA: Diagnosis not present

## 2023-07-19 DIAGNOSIS — H02849 Edema of unspecified eye, unspecified eyelid: Secondary | ICD-10-CM | POA: Diagnosis not present

## 2023-07-19 DIAGNOSIS — R609 Edema, unspecified: Secondary | ICD-10-CM | POA: Diagnosis not present

## 2023-07-19 DIAGNOSIS — E876 Hypokalemia: Secondary | ICD-10-CM

## 2023-07-19 DIAGNOSIS — G319 Degenerative disease of nervous system, unspecified: Secondary | ICD-10-CM | POA: Diagnosis not present

## 2023-07-19 DIAGNOSIS — M4800 Spinal stenosis, site unspecified: Secondary | ICD-10-CM

## 2023-07-19 DIAGNOSIS — Z125 Encounter for screening for malignant neoplasm of prostate: Secondary | ICD-10-CM | POA: Diagnosis not present

## 2023-07-19 DIAGNOSIS — R29898 Other symptoms and signs involving the musculoskeletal system: Secondary | ICD-10-CM

## 2023-07-19 DIAGNOSIS — L819 Disorder of pigmentation, unspecified: Secondary | ICD-10-CM

## 2023-07-19 DIAGNOSIS — R531 Weakness: Secondary | ICD-10-CM

## 2023-07-19 DIAGNOSIS — M48061 Spinal stenosis, lumbar region without neurogenic claudication: Secondary | ICD-10-CM

## 2023-07-19 DIAGNOSIS — H5789 Other specified disorders of eye and adnexa: Secondary | ICD-10-CM

## 2023-07-19 DIAGNOSIS — Z91199 Patient's noncompliance with other medical treatment and regimen due to unspecified reason: Secondary | ICD-10-CM | POA: Diagnosis not present

## 2023-07-19 DIAGNOSIS — G8929 Other chronic pain: Secondary | ICD-10-CM | POA: Diagnosis not present

## 2023-07-19 MED ORDER — OXYCODONE-ACETAMINOPHEN 5-325 MG PO TABS
1.0000 | ORAL_TABLET | Freq: Once | ORAL | Status: AC
  Administered 2023-07-19: 1 via ORAL
  Filled 2023-07-19: qty 1

## 2023-07-19 NOTE — Telephone Encounter (Signed)
 noted

## 2023-07-19 NOTE — ED Provider Notes (Signed)
 3:36 PM Care assumed from Dr. Synetta Eves.  At time of transfer of care, patient waiting for results of CT imaging of the head neck and face to look for significant traumatic injuries after fall today.  If imaging reassuring, plan of care we discharged home for outpatient follow-up.  5:47 PM CT scans returned without evidence of significant traumatic injuries and no skull fracture or intracranial bleeding.  No C-spine fracture.  Patient does have swelling of his lacrimal gland on the right side and patient tells me that this is been there for about 6 months and slowly growing.  The CT scan report recommended ophthalmology consultation however he says he is already scheduled to see an ophthalmologist coming up for this.  He is feeling better and would like to go home.  Given his improvement in discomfort, his reassuring imaging, and his well appearance now with plans for outpatient ophthalmology follow-up, will discharge for outpatient follow-up.  Patient understood return precautions for obstruction and was discharged in good condition.   Clinical Impression: 1. Fall, initial encounter   2. Contusion of scalp, initial encounter   3. Chronic anticoagulation     Disposition: Discharge  Condition: Good  I have discussed the results, Dx and Tx plan with the pt(& family if present). He/she/they expressed understanding and agree(s) with the plan. Discharge instructions discussed at great length. Strict return precautions discussed and pt &/or family have verbalized understanding of the instructions. No further questions at time of discharge.    New Prescriptions   No medications on file    Follow Up: No follow-up provider specified.    Lorra Freeman, Marine Sia, MD 07/19/23 1750

## 2023-07-19 NOTE — Discharge Instructions (Signed)
 Your workup, history, exam did not show evidence of significant medication today is any soft tissue injuries.  Your CT did not show skull fracture or intracranial bleed.  It did show the bump on the back of your head and also showed that mass in one of the glands near your right eye.  You tell me you are scheduled to see ophthalmology coming up so we will not consult them today but we do recommend going to that appointment soon as possible.  Please rest and stay hydrated.  If any symptoms change or worsen acutely, would return to the nearest emergency department.

## 2023-07-19 NOTE — Telephone Encounter (Signed)
 Spoke with patient today during his visit with his PCP. Patient has not had any of the CT imaging test completed or followed up on getting appointments for the referrals that have been placed by his PCP.  Patient said he has been unable to schedule or receive calls to schedule appointments, because his phone is not working. It will not keep a charge. Patient said that he plans on getting a phone from [place] on the 3rd of the month. Patient voiced that at [facility] where he lives someone was passing out phones and he was given one of the phones. I asked him if he thought they would give him another since his will not charge. Patient voiced that the office manager was not there this week, but he would ask her when she returns.  I asked patient if there was someone that we could contact in the meantime to help us  get a message to him. Patient said his neighbor Royston Cornea will be able to get him a message and he will also let him use his phone if needed. Patient will call us  with James's number so I can add it to his contacts in his chart. Instructed patient to call us  when he gets his new phone so that his contact information can be updated.  Patient is agreeable with plan. Voiced he will call as soon as he gets home.   Scheduled patient for CT ABDOMEN PELVIS W CONTRAST, CT MAXILLOFACIAL W CONTRAST, and CT CHEST LUNG CANCER SCREENING LOW DOSE WO CONTRAST on 08/02/2023 at 12:00 pm at [facility] imaging on [address] Spoke with Pansy to schedule. Advised patient of the appointment and the time he is to be there. Written reminder given to patient. Advised patient that Transportation will be arranged to ensure he is able to attend appointment. Patient verbalized understanding. Patient also aware that we would be calling him with his appointments for ophthalmology, Neurology and for his colonoscopy and that someone will need to stay with him for the colonoscopy. Patient voiced that he was aware and that he had someone that would  be available to go with him.  Advised patient that we would call Royston Cornea to leave messages, and he should return our call as soon as possible so that transportation can be scheduled. Patient gave consent to leave messages with Royston Cornea. Patient voiced understanding of all discussed.

## 2023-07-19 NOTE — Patient Instructions (Addendum)
 Ophthalmology Sent Referral to [facility] ph # [phone] address [address] they will contact the patient to schedule an appointment.Sent Referral and Notes in [employer] [facility] PRO[facility]    Colonoscopy: Placed in [facility] [address] [place], [place] [address] PH# [phone]    Neurology: Placed in [facility]  [address]  [place], [place] [address] Ph# [phone]

## 2023-07-19 NOTE — Progress Notes (Addendum)
 Subjective:  Patient ID: [name], male    DOB: 09/09/1960  Age: 63 y.o. MRN: [id number]  CC: Medical Management of Chronic Issues (Pain and weakness in legs/Frequent falls)     Discussed the use of AI scribe software for clinical note transcription with the patient, who gave verbal consent to proceed.  History of Present Illness The patient  is a 63 y.o. year old male with a history of COPD, chronic sinusitis, recurrent DVT and PE (on anticoagulation with Eliquis ),  coronary artery disease (status post stent, last cardiac cath from 12/2022 revealed 20% proximal LCx lesion, 30% proximal RCA lesion, 30% mid to distal RCA lesion, 100% occluded D1, 50% proximal to mid LAD lesion and 50% ramus lesion, medical management recommended),  (EF 40-45% from 06/2022),tobacco abuse, status post posterior lumbar interbody fusion in 10/2020, history of parotidectomy in 02/2023 (pathology revealed Warthin's tumor), Nicotine  dependence (about 10 Cigarettes/day >68yrs)  At his visit in 03/2023 he had presented with a history of leg weakness, and eye swelling, presents with ongoing symptoms.  He reports multiple falls both inside and outside his apartment due to leg weakness.    He has not seen any of his doctors, including the pulmonologist, I referred him to, neurologist, neurosurgeon.  His chart reveals several attempts at trying to contact him which were futile.  He has also had a significant weight loss of nineteen pounds in three months. He confirms that the swelling in his eye is getting bigger. He admits to smoking, and reports a lack of appetite. He also mentions a rash on his face and a pain in his left side side where he had a prior to the surgery. I had referred him for maxillofacial CT and lung cancer screening CT which he never completed.  The patient acknowledges that he has not been proactive in managing his health and promises to do better.    Past Medical History:  Diagnosis Date    Asthma    CAD (coronary artery disease)    a. 09/2013 NSTEMI/PCI: LM nl, LAD 40-50%, D1 100 (2.25 x 28 Vision BMS), LCX min irregs, RI 60-70, 30, RCA 40-50/50-35ms/p, EF 45-50%.  b. cath 12/2014 -occulded BMS in diag, 50% pro to mid LAD, 60% ramus, 30% RCA    Chest pain 08/2015   Chronic diastolic CHF (congestive heart failure) (HCC)    a. 09/2014 EF 45-50% by LV gram;  b. 01/2014 Echo: EF 55-60%, Gr 1 DD.   Cocaine  abuse (HCC)    COPD (chronic obstructive pulmonary disease) (HCC)    DVT (deep venous thrombosis) (HCC)    "I had ~ 10 in each leg" (12/20/2016)   Essential hypertension    Hepatitis C    "clear free over a year now"   High cholesterol    Myocardial infarction [facility]) ~ 2014   Osteoarthritis    a. hands and toes.   Pneumonia    "several times" (12/20/2016)   Pulmonary embolism (HCC)    a. 2012 - s/p IVC filter;  b. prev on eliquis  - noncompliant.   Shortness of breath dyspnea    Sinus headache    Squamous cell skin cancer 07/13/2016   "foot"   Tobacco abuse     Past Surgical History:  Procedure Laterality Date   BACK SURGERY  2022   CARDIAC CATHETERIZATION N/A 01/04/2015   Procedure: Left Heart Cath and Coronary Angiography;  Surgeon: Odie Benne, MD;  Location: [facility] INVASIVE CV LAB;  Service: Cardiovascular;  Laterality: N/A;   CORONARY ANGIOPLASTY WITH STENT PLACEMENT  10/21/2013   BMS to D1   CYST EXCISION N/A 05/10/2016   Procedure: EXCISION OF SEBACEOUS CYST UPPER BACK;  Surgeon: Dareen Ebbing, MD;  Location: [facility] OR;  Service: General;  Laterality: N/A;   FOOT SURGERY Right ~ 06/2016   "said there was skin cancer on it"   LEFT HEART CATH AND CORONARY ANGIOGRAPHY N/A 01/11/2023   Procedure: LEFT HEART CATH AND CORONARY ANGIOGRAPHY;  Surgeon: Odie Benne, MD;  Location: [facility] INVASIVE CV LAB;  Service: Cardiovascular;  Laterality: N/A;   LEFT HEART CATHETERIZATION WITH CORONARY ANGIOGRAM N/A 10/21/2013   Procedure: LEFT HEART CATHETERIZATION WITH  CORONARY ANGIOGRAM;  Surgeon: Arleen Lacer, MD;  Location: [facility] CATH LAB;  Service: Cardiovascular;  Laterality: N/A;   PAROTIDECTOMY Left 03/19/2023   Procedure: PAROTIDECTOMY;  Surgeon: Janita Mellow, MD;  Location: [facility] OR;  Service: ENT;  Laterality: Left;   VENA CAVA FILTER PLACEMENT  ~ 2007-~ 2017   "1 in my neck; 2 in my wrist"    Family History  Problem Relation Age of Onset   Asthma Mother    Allergies Mother    Allergies Sister    Allergies Brother    Deep vein thrombosis Brother        two brothers with recurrent DVT   Heart attack Father        a.60s b. deceased in his 57s   Coronary artery disease Father    Colon cancer Neg Hx    Liver cancer Neg Hx    Esophageal cancer Neg Hx    Colon polyps Neg Hx    Rectal cancer Neg Hx    Pancreatic cancer Neg Hx    Stomach cancer Neg Hx     Social History   Socioeconomic History   Marital status: Single    Spouse name: Not on file   Number of children: Not on file   Years of education: Not on file   Highest education level: Not on file  Occupational History   Occupation: unemployed  Tobacco Use   Smoking status: Every Day    Current packs/day: 1.00    Average packs/day: 1 pack/day for 35.0 years (35.0 ttl pk-yrs)    Types: Cigarettes    Passive exposure: Never   Smokeless tobacco: Never  Vaping Use   Vaping status: Never Used  Substance and Sexual Activity   Alcohol use: Yes    Alcohol/week: 6.0 standard drinks of alcohol    Types: 2 Cans of beer, 4 Shots of liquor per week    Comment: socially   Drug use: Not Currently    Types: Cocaine     Comment: 12/20/2016 "might have used some the other day; I'm not sure"   Sexual activity: Not Currently  Other Topics Concern   Not on file  Social History Narrative   Lives in [place].   Social Drivers of Corporate investment banker Strain: Low Risk  (06/21/2022)   Overall Financial Resource Strain (CARDIA)    Difficulty of Paying Living Expenses: Not hard at all  Food  Insecurity: No Food Insecurity (03/19/2023)   Hunger Vital Sign    Worried About Running Out of Food in the Last Year: Never true    Ran Out of Food in the Last Year: Never true  Transportation Needs: No Transportation Needs (03/19/2023)   PRAPARE - Administrator, Civil Service (Medical): No    Lack of Transportation (Non-Medical): No  Physical Activity: Inactive (06/21/2022)   Exercise Vital Sign    Days of Exercise per Week: 0 days    Minutes of Exercise per Session: 0 min  Stress: No Stress Concern Present (06/21/2022)   [employer] of Occupational Health - Occupational Stress Questionnaire    Feeling of Stress : Not at all  Social Connections: Not on file    Allergies  Allergen Reactions   Atorvastatin      myalgia    Outpatient Medications Prior to Visit  Medication Sig Dispense Refill   albuterol  (PROAIR  HFA) 108 (90 Base) MCG/ACT inhaler Inhale 2 puffs into the lungs every 6 (six) hours as needed for wheezing or shortness of breath. 6.7 g 6   albuterol  (PROVENTIL ) (2.5 MG/3ML) 0.083% nebulizer solution Take 3 mLs (2.5 mg total) by nebulization every 6 (six) hours as needed for wheezing or shortness of breath. 90 mL 6   apixaban  (ELIQUIS ) 5 MG TABS tablet Take 1 tablet (5 mg total) by mouth 2 (two) times daily. 180 tablet 1   azelastine  (ASTELIN ) 0.1 % nasal spray Place 2 sprays into both nostrils 2 (two) times daily as needed for rhinitis. Use in each nostril as directed     Azelastine -Fluticasone  137-50 MCG/ACT SUSP Place 2 sprays into the nose every 12 (twelve) hours. 23 g 5   buPROPion  (WELLBUTRIN  XL) 150 MG 24 hr tablet Take 1 tablet (150 mg total) by mouth daily. For smoking cessation. 90 tablet 1   cetirizine  (ZYRTEC ) 10 MG tablet Take 1 tablet (10 mg total) by mouth daily. 90 tablet 1   empagliflozin  (JARDIANCE ) 10 MG TABS tablet Take 1 tablet (10 mg total) by mouth daily before breakfast. 90 tablet 3   Evolocumab  140 MG/ML SOAJ Inject 140 mg into the  skin every 14 (fourteen) days. 6 mL 3   ezetimibe  (ZETIA ) 10 MG tablet Take 1 tablet (10 mg total) by mouth daily. 90 tablet 3   Fluticasone -Umeclidin-Vilant (TRELEGY ELLIPTA ) 100-62.5-25 MCG/ACT AEPB Inhale 1 puff into the lungs daily. 60 each 0   furosemide  (LASIX ) 40 MG tablet Take 1 tablet (40 mg total) by mouth daily as needed (May take 40mg  Daily for Swelling and Shortness of breath). 90 tablet 1   gabapentin  (NEURONTIN ) 100 MG capsule Take 1 capsule (100 mg total) by mouth at bedtime. 90 capsule 1   HYDROcodone -acetaminophen  (NORCO) 7.5-325 MG tablet Take 1 tablet by mouth every 6 (six) hours as needed for moderate pain (pain score 4-6). 20 tablet 0   isosorbide  mononitrate (IMDUR ) 30 MG 24 hr tablet Take 1 tablet (30 mg total) by mouth daily. 90 tablet 1   losartan  (COZAAR ) 25 MG tablet Take 1 tablet (25 mg total) by mouth daily. 90 tablet 3   metoprolol  succinate (TOPROL -XL) 25 MG 24 hr tablet Take 1 tablet (25 mg total) by mouth daily. 90 tablet 1   montelukast  (SINGULAIR ) 10 MG tablet Take 1 tablet (10 mg total) by mouth at bedtime. 90 tablet 1   ondansetron  (ZOFRAN -ODT) 4 MG disintegrating tablet Take 1 tablet (4 mg total) by mouth every 8 (eight) hours as needed for nausea or vomiting. 20 tablet 0   pantoprazole  (PROTONIX ) 40 MG tablet Take 1 tablet (40 mg total) by mouth daily. 90 tablet 1   potassium chloride  SA (KLOR-CON  M) 20 MEQ tablet Take 1 tablet (20 mEq total) by mouth daily. 5 tablet 0   nitroGLYCERIN  (NITROSTAT ) 0.4 MG SL tablet Place 1 tablet (0.4 mg total) under the tongue every 5 (five)  minutes as needed for chest pain. 25 tablet 3   No facility-administered medications prior to visit.     ROS Review of Systems  Constitutional:  Negative for activity change and appetite change.  HENT:  Negative for sinus pressure and sore throat.   Respiratory:  Negative for chest tightness, shortness of breath and wheezing.   Cardiovascular:  Negative for chest pain and  palpitations.  Gastrointestinal:  Negative for abdominal distention, abdominal pain and constipation.  Genitourinary: Negative.   Musculoskeletal:        See HPI  Neurological:  Positive for weakness.  Psychiatric/Behavioral:  Negative for behavioral problems and dysphoric mood.     Objective:  BP 116/73   Pulse 85   Ht 6\' 2"  (1.88 m)   Wt 160 lb 3.2 oz (72.7 kg)   SpO2 98%   BMI 20.57 kg/m      07/19/2023    1:26 PM 07/19/2023    1:25 PM 07/19/2023   11:00 AM  BP/Weight  Systolic BP  162 161  Diastolic BP  86 73  Wt. (Lbs) 160.05  160.2  BMI 20.55 kg/m2  20.57 kg/m2    Wt Readings from Last 3 Encounters:  07/19/23 160 lb 0.9 oz (72.6 kg)  07/19/23 160 lb 3.2 oz (72.7 kg)  05/22/23 170 lb (77.1 kg)     Physical Exam Constitutional:      Appearance: He is well-developed.  Eyes:     Comments: Right supraorbital swelling  Cardiovascular:     Rate and Rhythm: Normal rate.     Heart sounds: Normal heart sounds. No murmur heard. Pulmonary:     Effort: Pulmonary effort is normal.     Breath sounds: Normal breath sounds. No wheezing or rales.  Chest:     Chest wall: No tenderness.  Abdominal:     General: Bowel sounds are normal. There is no distension.     Palpations: Abdomen is soft. There is no mass.     Tenderness: There is no abdominal tenderness.  Musculoskeletal:        General: Normal range of motion.     Right lower leg: No edema.     Left lower leg: No edema.  Skin:    Comments: Mixture of hyper and hypopigmented rash on face.  Right side of the jaw with correlation of small plaques  Neurological:     Mental Status: He is alert and oriented to person, place, and time.     Comments: Reduced strength in bilateral lower extremities  Psychiatric:        Mood and Affect: Mood normal.        Latest Ref Rng & Units 05/22/2023    9:40 AM 03/19/2023    1:29 PM 01/11/2023    8:51 AM  CMP  Glucose 70 - 99 mg/dL 096  045    BUN 8 - 23 mg/dL 10  11     Creatinine 4.09 - 1.24 mg/dL 8.11  9.14    Sodium 782 - 145 mmol/L 138  136    Potassium 3.5 - 5.1 mmol/L 2.9  4.3  3.9   Chloride 98 - 111 mmol/L 108  100    CO2 22 - 32 mmol/L 22  24    Calcium  8.9 - 10.3 mg/dL 8.8  9.4    Total Protein 6.5 - 8.1 g/dL  8.2    Total Bilirubin <1.2 mg/dL  0.8    Alkaline Phos 38 - 126 U/L  91  AST 15 - 41 U/L  31    ALT 0 - 44 U/L  23      Lipid Panel     Component Value Date/Time   CHOL 218 (H) 12/07/2021 0908   TRIG 99 12/07/2021 0908   HDL 61 12/07/2021 0908   CHOLHDL 2.4 08/13/2020 1105   CHOLHDL 3.1 01/01/2015 0208   VLDL 12 01/01/2015 0208   LDLCALC 139 (H) 12/07/2021 0908    CBC    Component Value Date/Time   WBC 5.7 05/22/2023 0940   RBC 4.46 05/22/2023 0940   HGB 14.2 05/22/2023 0940   HGB 13.9 01/05/2023 1014   HCT 42.9 05/22/2023 0940   HCT 42.5 01/05/2023 1014   PLT 251 05/22/2023 0940   PLT 157 01/05/2023 1014   MCV 96.2 05/22/2023 0940   MCV 98 (H) 01/05/2023 1014   MCH 31.8 05/22/2023 0940   MCHC 33.1 05/22/2023 0940   RDW 14.1 05/22/2023 0940   RDW 13.7 01/05/2023 1014   LYMPHSABS 2.1 05/22/2023 0940   LYMPHSABS 1.8 10/05/2022 1118   MONOABS 0.6 05/22/2023 0940   EOSABS 0.3 05/22/2023 0940   EOSABS 0.5 (H) 10/05/2022 1118   BASOSABS 0.0 05/22/2023 0940   BASOSABS 0.0 10/05/2022 1118    Lab Results  Component Value Date   HGBA1C 5.8 (H) 12/07/2021    Lab Results  Component Value Date   TSH 2.220 10/05/2022       Assessment & Plan Lower extremity weakness History of spinal stenosis Chronic weakness with falls, -Despite encouragement on several occasions he has not followed up with his neurosurgeon - Refer to neurology for evaluation; provided him with the contact so he can reach out to them - Check potassium and magnesium  levels. - Order CBC, kidney and liver function tests, and diabetes screening.  Spinal stenosis Chronic pain with leg weakness. Previous surgery noted, no recent neurosurgeon  follow-up. Referral missed. - Refer to neurosurgeon for evaluation.  Weight loss Significant 19-pound loss over three months. Concern for malignancy due to tobacco use and symptoms. CT scans needed. - Order CT scans of abdomen, pelvis, lungs -Advised to complete maxillofacial CT - Check for prostate cancer. - Order CBC, kidney and liver function tests, and diabetes screening. - Referred for colonoscopy and GI office tried to reach him but were unsuccessful  Eye swelling Progressive swelling with missed ophthalmology appointments. Concern for underlying pathology with weight loss and facial changes. - Provide contact information to reschedule ophthalmology appointment. - Needs to complete maxillofacial CT  Dark patches on face Dark patches with concern for systemic issue. Differential includes dermatological or systemic causes. HIV test needed. - Prescribe hydrocortisone  for facial rash.  Hypokalemia - Last potassium was 2.9 - This could also exacerbate his weakness Will check level again today  Tobacco use Continued use despite counseling. Increased risk for malignancy and health issues. Advised cessation due to weight loss and cancer risk. - Advise cessation of tobacco use due to increased cancer risk.   Noncompliance  RN and case manager updated about patient case.  Unfortunately this is a complex patient at risk of deterioration due to noncompliance.  He does have significant barriers including transportation, lack of a working phone.  RN has verified he can be reached to his neighbors phone.  Staff have assisted in scheduling his CT scans for him I will assist in scheduling appointment with specialist.  Transportation arranged to his CT scan appointment  Visit required 53 minutes of patient care including median intraservice  time, reviewing previous notes and test results, coordination of care, counseling the patient in addition to management of chronic medical conditions.Time  also spent ordering medications, investigations and documenting in the chart.  All questions were answered to the patient's satisfaction    No orders of the defined types were placed in this encounter.   Follow-up: Return in about 6 weeks (around 08/30/2023) for Chronic medical conditions.    Addendum: 07/27/2023 Patient called requesting a prescription for walker due to increased falls the result of lower extremity weakness which led to an ED visit.  A cane will not suffice.  Prescription for walker will be sent to DME company.   Joaquin Mulberry, MD, FAAFP. [facility] [place], [place] [phone]   07/19/2023, 2:09 PM

## 2023-07-19 NOTE — ED Provider Notes (Signed)
 [place] EMERGENCY DEPARTMENT AT [facility] Provider Note   CSN: [id number] Arrival date & time: 07/19/23  1313     History  Chief Complaint  Patient presents with   Steffan Caniglia is a 63 y.o. male.  HPI 63 year old man history of PE, history of DVT, on Eliquis  with last dose this morning states he tripped and fell this morning across the street from here and struck his head on the curb.  He is complaining mainly about swelling and pain to the back of his head but also struck his right periorbital area and has some swelling there.  He states his vision is normal.  He denies any other injury.  He has some headache.    Home Medications Prior to Admission medications   Medication Sig Start Date End Date Taking? Authorizing Provider  albuterol  (PROAIR  HFA) 108 (90 Base) MCG/ACT inhaler Inhale 2 puffs into the lungs every 6 (six) hours as needed for wheezing or shortness of breath. 04/19/23   Joaquin Mulberry, MD  albuterol  (PROVENTIL ) (2.5 MG/3ML) 0.083% nebulizer solution Take 3 mLs (2.5 mg total) by nebulization every 6 (six) hours as needed for wheezing or shortness of breath. 04/19/23   Joaquin Mulberry, MD  apixaban  (ELIQUIS ) 5 MG TABS tablet Take 1 tablet (5 mg total) by mouth 2 (two) times daily. 04/19/23   Newlin, Enobong, MD  azelastine  (ASTELIN ) 0.1 % nasal spray Place 2 sprays into both nostrils 2 (two) times daily as needed for rhinitis. Use in each nostril as directed    [provider]  Azelastine -Fluticasone  137-50 MCG/ACT SUSP Place 2 sprays into the nose every 12 (twelve) hours. 04/19/23   Newlin, Enobong, MD  buPROPion  (WELLBUTRIN  XL) 150 MG 24 hr tablet Take 1 tablet (150 mg total) by mouth daily. For smoking cessation. 04/19/23   Newlin, Enobong, MD  cetirizine  (ZYRTEC ) 10 MG tablet Take 1 tablet (10 mg total) by mouth daily. 06/07/22   Sean Czar, MD  empagliflozin  (JARDIANCE ) 10 MG TABS tablet Take 1 tablet (10 mg total) by mouth daily  before breakfast. 04/19/23   Newlin, Enobong, MD  Evolocumab  140 MG/ML SOAJ Inject 140 mg into the skin every 14 (fourteen) days. 12/28/22   Meng, Hao, PA  ezetimibe  (ZETIA ) 10 MG tablet Take 1 tablet (10 mg total) by mouth daily. 04/19/23   Newlin, Enobong, MD  Fluticasone -Umeclidin-Vilant (TRELEGY ELLIPTA ) 100-62.5-25 MCG/ACT AEPB Inhale 1 puff into the lungs daily. 03/29/23   Newlin, Enobong, MD  furosemide  (LASIX ) 40 MG tablet Take 1 tablet (40 mg total) by mouth daily as needed (May take 40mg  Daily for Swelling and Shortness of breath). 06/14/22   Acharya, Gayatri A, MD  gabapentin  (NEURONTIN ) 100 MG capsule Take 1 capsule (100 mg total) by mouth at bedtime. 04/19/23   Newlin, Enobong, MD  HYDROcodone -acetaminophen  (NORCO) 7.5-325 MG tablet Take 1 tablet by mouth every 6 (six) hours as needed for moderate pain (pain score 4-6). 03/20/23   Janita Mellow, MD  isosorbide  mononitrate (IMDUR ) 30 MG 24 hr tablet Take 1 tablet (30 mg total) by mouth daily. 06/14/22   Acharya, Gayatri A, MD  losartan  (COZAAR ) 25 MG tablet Take 1 tablet (25 mg total) by mouth daily. 04/19/23   Newlin, Enobong, MD  metoprolol  succinate (TOPROL -XL) 25 MG 24 hr tablet Take 1 tablet (25 mg total) by mouth daily. 04/19/23   Newlin, Enobong, MD  montelukast  (SINGULAIR ) 10 MG tablet Take 1 tablet (10 mg total) by mouth at  bedtime. 04/19/23   Newlin, Enobong, MD  nitroGLYCERIN  (NITROSTAT ) 0.4 MG SL tablet Place 1 tablet (0.4 mg total) under the tongue every 5 (five) minutes as needed for chest pain. 06/14/22 03/15/23  Acharya, Gayatri A, MD  ondansetron  (ZOFRAN -ODT) 4 MG disintegrating tablet Take 1 tablet (4 mg total) by mouth every 8 (eight) hours as needed for nausea or vomiting. 04/19/23   Newlin, Enobong, MD  pantoprazole  (PROTONIX ) 40 MG tablet Take 1 tablet (40 mg total) by mouth daily. 04/19/23   Newlin, Enobong, MD  potassium chloride  SA (KLOR-CON  M) 20 MEQ tablet Take 1 tablet (20 mEq total) by mouth daily. 05/22/23   Horton, Sidra Dredge,  DO      Allergies    Atorvastatin     Review of Systems   Review of Systems  Physical Exam Updated Vital Signs BP (!) 162/86 (BP Location: Left Arm)   Pulse 72   Temp 98 F (36.7 C) (Oral)   Resp 16   Ht 1.88 m (6\' 2" )   Wt 72.6 kg   SpO2 100%   BMI 20.55 kg/m  Physical Exam Vitals reviewed.  HENT:     Head: Normocephalic.     Comments: Large occipital hematoma    Right Ear: External ear normal.     Left Ear: External ear normal.     Nose: Nose normal.     Mouth/Throat:     Pharynx: Oropharynx is clear.  Eyes:     Comments: Right periorbital swelling  Neck:     Comments: Cervical collar in place Trachea is midline Some mild diffuse tenderness Cardiovascular:     Rate and Rhythm: Normal rate. Rhythm irregular.     Pulses: Normal pulses.  Pulmonary:     Effort: Pulmonary effort is normal.     Breath sounds: Normal breath sounds.  Abdominal:     General: Abdomen is flat.     Palpations: Abdomen is soft.  Musculoskeletal:     Comments: No obvious external signs of trauma on extremities.  All extremities ranged with no decreased range of movement or point tenderness noted  Skin:    General: Skin is warm and dry.     Capillary Refill: Capillary refill takes less than 2 seconds.  Neurological:     General: No focal deficit present.     Mental Status: He is alert.  Psychiatric:        Mood and Affect: Mood normal.     ED Results / Procedures / Treatments   Labs (all labs ordered are listed, but only abnormal results are displayed) Labs Reviewed - No data to display  EKG None  Radiology No results found.  Procedures Procedures    Medications Ordered in ED Medications  oxyCODONE -acetaminophen  (PERCOCET/ROXICET) 5-325 MG per tablet 1 tablet (1 tablet Oral Given 07/19/23 1358)    ED Course/ Medical Decision Making/ A&P                                 Medical Decision Making Amount and/or Complexity of Data Reviewed Radiology:  ordered.  Risk Prescription drug management.   63 yo male on eliquis  with mechanical fall today.  Patient hit head on back and has some right periorbital swelling.  NO vision changes. No injury below neck. CT head, neck and maxillofacial pending. Care discussed with Dr. Manus Sellers         Final Clinical Impression(s) / ED Diagnoses Final diagnoses:  Fall, initial encounter  Contusion of scalp, initial encounter  Chronic anticoagulation    Rx / DC Orders ED Discharge Orders     None         Auston Blush, MD 07/19/23 1537

## 2023-07-19 NOTE — ED Notes (Signed)
 Pt discharged by Vernice Goodell RN.  Pt had no questions.

## 2023-07-19 NOTE — ED Triage Notes (Signed)
 Patient BIB EMS after construction workers witnessed him fall back and hit his head as he was walking from his doctor's appointment. No LOC, right eye swelling, and contusion noted to the back of the head. Patient is on eliquis  and states that he is dizzy.   140/90 80 HR 97% RA

## 2023-07-20 ENCOUNTER — Telehealth: Payer: Self-pay | Admitting: Family Medicine

## 2023-07-20 NOTE — Telephone Encounter (Signed)
 fyi

## 2023-07-20 NOTE — Telephone Encounter (Signed)
 FYI!  Copied from CRM [id number]. Topic: General - Other  >> Jul 20, 2023  3:04 PM Lotus Round B wrote: Reason for CRM: pt called in to let Dr.Newlin know that he was in the hospital yesterday due to falling in the street and hitting his head. He said he is still alittle dizzy and they should have kept him in the hospital.

## 2023-07-23 ENCOUNTER — Other Ambulatory Visit: Payer: Self-pay | Admitting: Family Medicine

## 2023-07-23 DIAGNOSIS — D472 Monoclonal gammopathy: Secondary | ICD-10-CM

## 2023-07-23 DIAGNOSIS — R29898 Other symptoms and signs involving the musculoskeletal system: Secondary | ICD-10-CM

## 2023-07-23 LAB — PSA, TOTAL AND FREE
PSA, Free Pct: 21.1 %
PSA, Free: 0.19 ng/mL
Prostate Specific Ag, Serum: 0.9 ng/mL (ref 0.0–4.0)

## 2023-07-23 LAB — MULTIPLE MYELOMA PANEL, SERUM
Albumin SerPl Elph-Mcnc: 3.4 g/dL (ref 2.9–4.4)
Albumin/Glob SerPl: 0.8 (ref 0.7–1.7)
Alpha 1: 0.3 g/dL (ref 0.0–0.4)
Alpha2 Glob SerPl Elph-Mcnc: 0.8 g/dL (ref 0.4–1.0)
B-Globulin SerPl Elph-Mcnc: 1.2 g/dL (ref 0.7–1.3)
Gamma Glob SerPl Elph-Mcnc: 2.1 g/dL — ABNORMAL HIGH (ref 0.4–1.8)
Globulin, Total: 4.3 g/dL — ABNORMAL HIGH (ref 2.2–3.9)
IgA/Immunoglobulin A, Serum: 188 mg/dL (ref 61–437)
IgG (Immunoglobin G), Serum: 2499 mg/dL — ABNORMAL HIGH (ref 603–1613)
IgM (Immunoglobulin M), Srm: 46 mg/dL (ref 20–172)

## 2023-07-23 LAB — CMP14+EGFR
ALT: 20 IU/L (ref 0–44)
AST: 42 IU/L — ABNORMAL HIGH (ref 0–40)
Albumin: 3.8 g/dL — ABNORMAL LOW (ref 3.9–4.9)
Alkaline Phosphatase: 108 IU/L (ref 44–121)
BUN/Creatinine Ratio: 14 (ref 10–24)
BUN: 10 mg/dL (ref 8–27)
Bilirubin Total: 0.4 mg/dL (ref 0.0–1.2)
CO2: 19 mmol/L — ABNORMAL LOW (ref 20–29)
Calcium: 9.7 mg/dL (ref 8.6–10.2)
Chloride: 104 mmol/L (ref 96–106)
Creatinine, Ser: 0.7 mg/dL — ABNORMAL LOW (ref 0.76–1.27)
Globulin, Total: 3.9 g/dL (ref 1.5–4.5)
Glucose: 71 mg/dL (ref 70–99)
Potassium: 4.6 mmol/L (ref 3.5–5.2)
Sodium: 141 mmol/L (ref 134–144)
Total Protein: 7.7 g/dL (ref 6.0–8.5)
eGFR: 104 mL/min/{1.73_m2} (ref >59)

## 2023-07-23 LAB — LP+NON-HDL CHOLESTEROL
Cholesterol, Total: 186 mg/dL (ref 100–199)
HDL: 70 mg/dL (ref >39)
LDL Chol Calc (NIH): 98 mg/dL (ref 0–99)
Total Non-HDL-Chol (LDL+VLDL): 116 mg/dL (ref 0–129)
Triglycerides: 104 mg/dL (ref 0–149)
VLDL Cholesterol Cal: 18 mg/dL (ref 5–40)

## 2023-07-23 LAB — HEMOGLOBIN A1C
Est. average glucose Bld gHb Est-mCnc: 100 mg/dL
Hgb A1c MFr Bld: 5.1 % (ref 4.8–5.6)

## 2023-07-23 LAB — T3: T3, Total: 113 ng/dL (ref 71–180)

## 2023-07-23 LAB — TSH: TSH: 2.68 u[IU]/mL (ref 0.450–4.500)

## 2023-07-23 LAB — T4, FREE: Free T4: 1.31 ng/dL (ref 0.82–1.77)

## 2023-07-23 NOTE — Progress Notes (Signed)
 Referral received for gammopathy. Will schedule for 4-6 weeks.

## 2023-07-26 ENCOUNTER — Telehealth: Payer: Self-pay | Admitting: Family Medicine

## 2023-07-26 NOTE — Telephone Encounter (Signed)
 Unable to reach patient. Patient call to give us  his Neighbor's phone number as a contact. Contact information updated. Spoke with Royston Cornea and asked if he would have the patient to call us  as soon as possible.  Advised to ask for Edker Punt, Dr. Jennet Mode Nurse and to ask to have her called directly. Royston Cornea said he would get the patient the message

## 2023-07-26 NOTE — Telephone Encounter (Signed)
 Spoke with patient. Advised patient that all the imaging that had been schedule for him on 08/02/2022 was done when he went to the ED that day of his Fall. Advised patient that we need to get him schedule to for the referrals that have been place for him. I asked the patient was there a certain day of the week that we could not schedule for him , patient said that any day or time was ok with him. Patient said that we could call Royston Cornea his neighbor to leave a message and he will call us .   Patient voiced that he really would like a walker. Voiced that he still having some dizziness.

## 2023-07-26 NOTE — Telephone Encounter (Signed)
 Patient called back retuning Fritzi Jewels, RN call. Call transferred to [facility].

## 2023-07-26 NOTE — Telephone Encounter (Signed)
Please reach out to patient.

## 2023-07-26 NOTE — Telephone Encounter (Signed)
 Copied from CRM [id number]. Topic: General - Other  >> Jul 26, 2023  2:08 PM Sophia H wrote: Reason for CRM: Spoke with the patient who is needing to know the phone number for where he will be having his imaging done on 05/08 also needs clear instructions on how to prepare, etc. Pt stated please call # [phone] and leave a message with information if he does not answer. Thank you

## 2023-07-27 MED ORDER — MISC. DEVICES MISC: 0 refills | Status: AC

## 2023-07-27 NOTE — Addendum Note (Signed)
 Addended by: Braxxton Stoudt on: 07/27/2023 10:17 AM   Modules accepted: Orders

## 2023-07-27 NOTE — Telephone Encounter (Signed)
 He did not have all of the imaging I ordered at his last visit done at his recent ED visit.  He still needs a CT abdomen and CT lung cancer screening.  I have placed an order for a walker and amended my last note to justify medical necessity.  Thank you.

## 2023-07-31 ENCOUNTER — Telehealth: Payer: Self-pay | Admitting: Family Medicine

## 2023-07-31 ENCOUNTER — Telehealth: Payer: Self-pay

## 2023-07-31 NOTE — Telephone Encounter (Signed)
 Patient schedule with [facility] Transportation arranged.

## 2023-07-31 NOTE — Telephone Encounter (Signed)
 FYI  Copied from CRM [id number]. Topic: Referral - Status >> Jul 31, 2023 10:57 AM Crispin Dolphin wrote: Reason for CRM: Corbin Dess called from [facility]. Received referral for patient. Attempted to contact patient multiple times. Closing out referral due to no contact. Thank You

## 2023-07-31 NOTE — Telephone Encounter (Signed)
 FYI

## 2023-08-01 ENCOUNTER — Encounter: Payer: Self-pay | Admitting: Podiatry

## 2023-08-01 ENCOUNTER — Ambulatory Visit (INDEPENDENT_AMBULATORY_CARE_PROVIDER_SITE_OTHER): Payer: No Typology Code available for payment source | Admitting: Podiatry

## 2023-08-01 DIAGNOSIS — M79674 Pain in right toe(s): Secondary | ICD-10-CM

## 2023-08-01 DIAGNOSIS — D4989 Neoplasm of unspecified behavior of other specified sites: Secondary | ICD-10-CM | POA: Diagnosis not present

## 2023-08-01 DIAGNOSIS — Z7901 Long term (current) use of anticoagulants: Secondary | ICD-10-CM | POA: Diagnosis not present

## 2023-08-01 DIAGNOSIS — B351 Tinea unguium: Secondary | ICD-10-CM

## 2023-08-01 DIAGNOSIS — H25813 Combined forms of age-related cataract, bilateral: Secondary | ICD-10-CM | POA: Diagnosis not present

## 2023-08-01 DIAGNOSIS — M79675 Pain in left toe(s): Secondary | ICD-10-CM | POA: Diagnosis not present

## 2023-08-01 NOTE — Progress Notes (Signed)
 This patient returns to my office for at risk foot care.  This patient requires this care by a professional since this patient will be at risk due to having coagulation defect. Patient is taking eliquis .  This patient is unable to cut nails himself since the patient cannot reach his nails.These nails are painful walking and wearing shoes.  This patient presents for at risk foot care today.  General Appearance  Alert, conversant and in no acute stress.  Vascular  Dorsalis pedis and posterior tibial  pulses are palpable  bilaterally.  Capillary return is within normal limits  bilaterally. Temperature is within normal limits  bilaterally.  Neurologic  Senn-Weinstein monofilament wire test within normal limits  bilaterally. Muscle power within normal limits bilaterally.  Nails Thick disfigured discolored nails with subungual debris  from hallux to fifth toes bilaterally. No evidence of bacterial infection or drainage bilaterally.  Orthopedic  No limitations of motion  feet .  No crepitus or effusions noted.  No bony pathology or digital deformities noted.  Skin  normotropic skin with no porokeratosis noted bilaterally.  No signs of infections or ulcers noted.   Clavi 4th toe left foot.  Onychomycosis  Pain in right toes  Pain in left toes  Consent was obtained for treatment procedures.   Mechanical debridement of nails 1-5  bilaterally performed with a nail nipper.  Filed with dremel without incident.    Return office visit    3    months                  Told patient to return for periodic foot care and evaluation due to potential at risk complications.   Ruffin Cotton DPM

## 2023-08-01 NOTE — Telephone Encounter (Signed)
 Spoke with Sierra Dresser for Oncology. Advised the patient name has just been updated so she should be able to reach him. Corbin Dess voiced that she would reopen his chart there call patient an schedule.

## 2023-08-02 ENCOUNTER — Ambulatory Visit
Admission: RE | Admit: 2023-08-02 | Discharge: 2023-08-02 | Disposition: A | Discharge status: Home / Self Care | Source: Ambulatory Visit | Attending: Family Medicine | Admitting: Family Medicine

## 2023-08-02 ENCOUNTER — Other Ambulatory Visit: Payer: Self-pay

## 2023-08-02 ENCOUNTER — Other Ambulatory Visit

## 2023-08-02 DIAGNOSIS — R634 Abnormal weight loss: Secondary | ICD-10-CM | POA: Diagnosis not present

## 2023-08-02 DIAGNOSIS — Z122 Encounter for screening for malignant neoplasm of respiratory organs: Secondary | ICD-10-CM | POA: Diagnosis not present

## 2023-08-02 DIAGNOSIS — F17210 Nicotine dependence, cigarettes, uncomplicated: Secondary | ICD-10-CM

## 2023-08-02 MED ORDER — IOPAMIDOL (ISOVUE-300) INJECTION 61%
100.0000 mL | Freq: Once | INTRAVENOUS | Status: AC | PRN
  Administered 2023-08-02: 100 mL via INTRAVENOUS

## 2023-08-03 ENCOUNTER — Other Ambulatory Visit: Payer: Self-pay

## 2023-08-03 ENCOUNTER — Other Ambulatory Visit (HOSPITAL_COMMUNITY): Payer: Self-pay

## 2023-08-03 NOTE — Telephone Encounter (Signed)
 Patient has completed CT abdomen and CT lung cancer screening and  also went to his  Ophthalmology appointment. Spoke with Madison at [facility] they will be faxing office noted to PCP. Patient has ONCOLOGY appointment 08/07/2023.

## 2023-08-03 NOTE — Telephone Encounter (Signed)
 Thank you for the update. His CT lung ca screen is still pending. Do you mind calling the Radiology reading room at [phone] so they can put in a reading as I just have the CT Abdomen reading so I can result both together and give him the message all together. Thanks.

## 2023-08-03 NOTE — Telephone Encounter (Signed)
 Call to Radiology reading (Spoke with Jerryl Morin) to request a reading for Patient's  CT lung screening be entered into the patient chart. Jerryl Morin said that she had to call IT as there's some wrong and can't get it to  the radiologist to read. But as soon as she could she would get it read and into the chart.

## 2023-08-06 ENCOUNTER — Other Ambulatory Visit: Payer: Self-pay | Admitting: Family Medicine

## 2023-08-06 DIAGNOSIS — Z95828 Presence of other vascular implants and grafts: Secondary | ICD-10-CM

## 2023-08-06 NOTE — Progress Notes (Signed)
 [facility] [phone]   [phone]  INITIAL CONSULTATION:  Patient Care Team: Joaquin Mulberry, MD as PCP - General (Family Medicine) Euell Herrlich, MD as PCP - Cardiology (Cardiology) Auther Bo, RN as Nurse Navigator  CHIEF COMPLAINTS/PURPOSE OF CONSULTATION:  Polyclonal gammopathy  HISTORY OF PRESENTING ILLNESS:  [name] 63 y.o. male with medical history significant for hypertension, hepatitis C, hyperlipidemia, coronary artery disease, recurrent DVT and PE status post IVC filter placement and Eliquis  therapy who presents to the hematology clinic for evaluation for abnormal SPEP.  He is unaccompanied for this visit.  On review of the previous records, the was evidence of elevated total protein found on labs from 03/19/2023.  He subsequently underwent repeat labs to check serum protein electrophoresis on 07/19/2023 that did not detect a monoclonal protein however there was elevated polyclonal increase in 1 or more immunoglobulins predominantly IgG measuring 2499 mg/dL.  On exam today, [name] reports that having some fatigue but is able to complete his daily activities on his own.  He does admit to having weight loss approximately 20 pounds since January 2025 but contributes this to poor dentition.  He continues to have chronic bilateral leg weakness which does impact his walking.  He sustained a fall on 07/19/2023 that resulted in an emergency room evaluation.  CT imaging did not show any evidence of significant traumatic injuries or skull fractures or intracranial bleeding.  He does have swelling of his right eyelid and CT imaging did show edema of the lacrimal gland on the right side.  He has undergone initial consultation with an ophthalmologist and is planning to follow-up with [facility] for further evaluation.  He denies nausea, vomiting or bowel habit changes.  He denies easy bruising or  signs of overt bleeding.  He denies fevers, chills, night sweats, shortness of breath, chest pain or cough.  He has no other complaints.  Rest of the 10 point ROS as below.  MEDICAL HISTORY:  Past Medical History:  Diagnosis Date   Asthma    CAD (coronary artery disease)    a. 09/2013 NSTEMI/PCI: LM nl, LAD 40-50%, D1 100 (2.25 x 28 Vision BMS), LCX min irregs, RI 60-70, 30, RCA 40-50/50-40ms/p, EF 45-50%.  b. cath 12/2014 -occulded BMS in diag, 50% pro to mid LAD, 60% ramus, 30% RCA    Chest pain 08/2015   Chronic diastolic CHF (congestive heart failure) (HCC)    a. 09/2014 EF 45-50% by LV gram;  b. 01/2014 Echo: EF 55-60%, Gr 1 DD.   Cocaine  abuse (HCC)    COPD (chronic obstructive pulmonary disease) (HCC)    DVT (deep venous thrombosis) (HCC)    "I had ~ 10 in each leg" (12/20/2016)   Essential hypertension    Hepatitis C    "clear free over a year now"   High cholesterol    Myocardial infarction [facility]) ~ 2014   Osteoarthritis    a. hands and toes.   Pneumonia    "several times" (12/20/2016)   Pulmonary embolism (HCC)    a. 2012 - s/p IVC filter;  b. prev on eliquis  - noncompliant.   Shortness of breath dyspnea    Sinus headache    Squamous cell skin cancer 07/13/2016   "foot"   Tobacco abuse     SURGICAL HISTORY: Past Surgical History:  Procedure Laterality Date   BACK SURGERY  2022   CARDIAC CATHETERIZATION N/A 01/04/2015   Procedure: Left Heart  Cath and Coronary Angiography;  Surgeon: Odie Benne, MD;  Location: [facility] INVASIVE CV LAB;  Service: Cardiovascular;  Laterality: N/A;   CORONARY ANGIOPLASTY WITH STENT PLACEMENT  10/21/2013   BMS to D1   CYST EXCISION N/A 05/10/2016   Procedure: EXCISION OF SEBACEOUS CYST UPPER BACK;  Surgeon: Dareen Ebbing, MD;  Location: [facility] OR;  Service: General;  Laterality: N/A;   FOOT SURGERY Right ~ 06/2016   "said there was skin cancer on it"   LEFT HEART CATH AND CORONARY ANGIOGRAPHY N/A 01/11/2023   Procedure: LEFT HEART CATH AND  CORONARY ANGIOGRAPHY;  Surgeon: Odie Benne, MD;  Location: [facility] INVASIVE CV LAB;  Service: Cardiovascular;  Laterality: N/A;   LEFT HEART CATHETERIZATION WITH CORONARY ANGIOGRAM N/A 10/21/2013   Procedure: LEFT HEART CATHETERIZATION WITH CORONARY ANGIOGRAM;  Surgeon: Arleen Lacer, MD;  Location: [facility] CATH LAB;  Service: Cardiovascular;  Laterality: N/A;   PAROTIDECTOMY Left 03/19/2023   Procedure: PAROTIDECTOMY;  Surgeon: Janita Mellow, MD;  Location: [facility] OR;  Service: ENT;  Laterality: Left;   VENA CAVA FILTER PLACEMENT  ~ 2007-~ 2017   "1 in my neck; 2 in my wrist"    SOCIAL HISTORY: Social History   Socioeconomic History   Marital status: Single    Spouse name: Not on file   Number of children: Not on file   Years of education: Not on file   Highest education level: Not on file  Occupational History   Occupation: unemployed  Tobacco Use   Smoking status: Every Day    Current packs/day: 1.00    Average packs/day: 1 pack/day for 35.0 years (35.0 ttl pk-yrs)    Types: Cigarettes    Passive exposure: Never   Smokeless tobacco: Never  Vaping Use   Vaping status: Never Used  Substance and Sexual Activity   Alcohol use: Yes    Alcohol/week: 6.0 standard drinks of alcohol    Types: 2 Cans of beer, 4 Shots of liquor per week    Comment: socially   Drug use: Not Currently    Types: Cocaine     Comment: 12/20/2016 "might have used some the other day; I'm not sure"   Sexual activity: Not Currently  Other Topics Concern   Not on file  Social History Narrative   Lives in [place].   Social Drivers of Corporate investment banker Strain: Low Risk  (06/21/2022)   Overall Financial Resource Strain (CARDIA)    Difficulty of Paying Living Expenses: Not hard at all  Food Insecurity: No Food Insecurity (03/19/2023)   Hunger Vital Sign    Worried About Running Out of Food in the Last Year: Never true    Ran Out of Food in the Last Year: Never true  Transportation Needs: No  Transportation Needs (03/19/2023)   PRAPARE - Administrator, Civil Service (Medical): No    Lack of Transportation (Non-Medical): No  Physical Activity: Inactive (06/21/2022)   Exercise Vital Sign    Days of Exercise per Week: 0 days    Minutes of Exercise per Session: 0 min  Stress: No Stress Concern Present (06/21/2022)   [employer] of Occupational Health - Occupational Stress Questionnaire    Feeling of Stress : Not at all  Social Connections: Not on file  Intimate Partner Violence: Not At Risk (03/19/2023)   Humiliation, Afraid, Rape, and Kick questionnaire    Fear of Current or Ex-Partner: No    Emotionally Abused: No    Physically Abused: No  Sexually Abused: No    FAMILY HISTORY: Family History  Problem Relation Age of Onset   Asthma Mother    Allergies Mother    Allergies Sister    Allergies Brother    Deep vein thrombosis Brother        two brothers with recurrent DVT   Heart attack Father        a.60s b. deceased in his 64s   Coronary artery disease Father    Colon cancer Neg Hx    Liver cancer Neg Hx    Esophageal cancer Neg Hx    Colon polyps Neg Hx    Rectal cancer Neg Hx    Pancreatic cancer Neg Hx    Stomach cancer Neg Hx     ALLERGIES:  is allergic to atorvastatin .  MEDICATIONS:  Current Outpatient Medications  Medication Sig Dispense Refill   albuterol  (PROAIR  HFA) 108 (90 Base) MCG/ACT inhaler Inhale 2 puffs into the lungs every 6 (six) hours as needed for wheezing or shortness of breath. 6.7 g 6   albuterol  (PROVENTIL ) (2.5 MG/3ML) 0.083% nebulizer solution Take 3 mLs (2.5 mg total) by nebulization every 6 (six) hours as needed for wheezing or shortness of breath. 90 mL 6   apixaban  (ELIQUIS ) 5 MG TABS tablet Take 1 tablet (5 mg total) by mouth 2 (two) times daily. 180 tablet 1   azelastine  (ASTELIN ) 0.1 % nasal spray Place 2 sprays into both nostrils 2 (two) times daily as needed for rhinitis. Use in each nostril as directed      Azelastine -Fluticasone  137-50 MCG/ACT SUSP Place 2 sprays into the nose every 12 (twelve) hours. 23 g 5   buPROPion  (WELLBUTRIN  XL) 150 MG 24 hr tablet Take 1 tablet (150 mg total) by mouth daily. For smoking cessation. 90 tablet 1   cetirizine  (ZYRTEC ) 10 MG tablet Take 1 tablet (10 mg total) by mouth daily. 90 tablet 1   empagliflozin  (JARDIANCE ) 10 MG TABS tablet Take 1 tablet (10 mg total) by mouth daily before breakfast. 90 tablet 3   Evolocumab  140 MG/ML SOAJ Inject 140 mg into the skin every 14 (fourteen) days. 6 mL 3   ezetimibe  (ZETIA ) 10 MG tablet Take 1 tablet (10 mg total) by mouth daily. 90 tablet 3   Fluticasone -Umeclidin-Vilant (TRELEGY ELLIPTA ) 100-62.5-25 MCG/ACT AEPB Inhale 1 puff into the lungs daily. 60 each 0   furosemide  (LASIX ) 40 MG tablet Take 1 tablet (40 mg total) by mouth daily as needed (May take 40mg  Daily for Swelling and Shortness of breath). 90 tablet 1   gabapentin  (NEURONTIN ) 100 MG capsule Take 1 capsule (100 mg total) by mouth at bedtime. 90 capsule 1   HYDROcodone -acetaminophen  (NORCO) 7.5-325 MG tablet Take 1 tablet by mouth every 6 (six) hours as needed for moderate pain (pain score 4-6). 20 tablet 0   isosorbide  mononitrate (IMDUR ) 30 MG 24 hr tablet Take 1 tablet (30 mg total) by mouth daily. 90 tablet 1   losartan  (COZAAR ) 25 MG tablet Take 1 tablet (25 mg total) by mouth daily. 90 tablet 3   metoprolol  succinate (TOPROL -XL) 25 MG 24 hr tablet Take 1 tablet (25 mg total) by mouth daily. 90 tablet 1   Misc. Devices MISC Rolling walker with seat.  Diagnosis-recurrent falls 1 each 0   montelukast  (SINGULAIR ) 10 MG tablet Take 1 tablet (10 mg total) by mouth at bedtime. 90 tablet 1   nitroGLYCERIN  (NITROSTAT ) 0.4 MG SL tablet Place 1 tablet (0.4 mg total) under the tongue every  5 (five) minutes as needed for chest pain. 25 tablet 3   ondansetron  (ZOFRAN -ODT) 4 MG disintegrating tablet Take 1 tablet (4 mg total) by mouth every 8 (eight) hours as needed for nausea  or vomiting. 20 tablet 0   pantoprazole  (PROTONIX ) 40 MG tablet Take 1 tablet (40 mg total) by mouth daily. 90 tablet 1   potassium chloride  SA (KLOR-CON  M) 20 MEQ tablet Take 1 tablet (20 mEq total) by mouth daily. 5 tablet 0   No current facility-administered medications for this visit.    REVIEW OF SYSTEMS:   Constitutional: ( - ) fevers, ( - )  chills , ( - ) night sweats Eyes: ( - ) blurriness of vision, ( - ) double vision, ( - ) watery eyes Ears, nose, mouth, throat, and face: ( - ) mucositis, ( - ) sore throat Respiratory: ( - ) cough, ( - ) dyspnea, ( - ) wheezes Cardiovascular: ( - ) palpitation, ( - ) chest discomfort, ( - ) lower extremity swelling Gastrointestinal:  ( - ) nausea, ( - ) heartburn, ( - ) change in bowel habits Skin: ( - ) abnormal skin rashes Lymphatics: ( - ) new lymphadenopathy, ( - ) easy bruising Neurological: ( - ) numbness, ( - ) tingling, ( - ) new weaknesses Behavioral/Psych: ( - ) mood change, ( - ) new changes  All other systems were reviewed with the patient and are negative.  PHYSICAL EXAMINATION: ECOG PERFORMANCE STATUS: 1 - Symptomatic but completely ambulatory  There were no vitals filed for this visit. There were no vitals filed for this visit.  GENERAL: well appearing male in NAD  SKIN: skin color, texture, turgor are normal, no rashes or significant lesions EYES: conjunctiva are pink and non-injected, sclera clear.  Edema of his right upper eyelid without any drainage. OROPHARYNX: no exudate, no erythema; lips, buccal mucosa, and tongue normal  NECK: supple, non-tender LYMPH:  no palpable lymphadenopathy in the cervical or supraclavicular lymph nodes.  LUNGS: clear to auscultation and percussion with normal breathing effort HEART: regular rate & rhythm and no murmurs and no lower extremity edema ABDOMEN: soft, non-tender, non-distended, normal bowel sounds Musculoskeletal: no cyanosis of digits and no clubbing  PSYCH: alert & oriented  x 3, fluent speech NEURO: no focal motor/sensory deficits  LABORATORY DATA:  I have reviewed the data as listed    Latest Ref Rng & Units 05/22/2023    9:40 AM 03/19/2023    1:29 PM 01/05/2023   10:14 AM  CBC  WBC 4.0 - 10.5 K/uL 5.7  5.1  4.3   Hemoglobin 13.0 - 17.0 g/dL 21.3  08.6  57.8   Hematocrit 39.0 - 52.0 % 42.9  47.3  42.5   Platelets 150 - 400 K/uL 251  144  157        Latest Ref Rng & Units 07/19/2023   12:32 PM 05/22/2023    9:40 AM 03/19/2023    1:29 PM  CMP  Glucose 70 - 99 mg/dL 71  469  629   BUN 8 - 27 mg/dL 10  10  11    Creatinine 0.76 - 1.27 mg/dL 5.28  4.13  2.44   Sodium 134 - 144 mmol/L 141  138  136   Potassium 3.5 - 5.2 mmol/L 4.6  2.9  4.3   Chloride 96 - 106 mmol/L 104  108  100   CO2 20 - 29 mmol/L 19  22  24    Calcium  8.6 -  10.2 mg/dL 9.7  8.8  9.4   Total Protein 6.0 - 8.5 g/dL 7.7   8.2   Total Bilirubin 0.0 - 1.2 mg/dL 0.4   0.8   Alkaline Phos 44 - 121 IU/L 108   91   AST 0 - 40 IU/L 42   31   ALT 0 - 44 IU/L 20   23      RADIOGRAPHIC STUDIES: I have personally reviewed the radiological images as listed and agreed with the findings in the report. CT CHEST LUNG CANCER SCREENING LOW DOSE WO CONTRAST Result Date: 08/06/2023 CLINICAL DATA:  Current 40 pack-year smoker. EXAM: CT CHEST WITHOUT CONTRAST LOW-DOSE FOR LUNG CANCER SCREENING TECHNIQUE: Multidetector CT imaging of the chest was performed following the standard protocol without IV contrast. RADIATION DOSE REDUCTION: This exam was performed according to the departmental dose-optimization program which includes automated exposure control, adjustment of the mA and/or kV according to patient size and/or use of iterative reconstruction technique. COMPARISON:  01/11/2021. FINDINGS: Cardiovascular: Atherosclerotic calcification of the aorta with age advanced involvement of the coronary arteries. Heart size normal. No pericardial effusion. Mediastinum/Nodes: No pathologically enlarged mediastinal or  axillary lymph nodes. Hilar regions are difficult to definitively evaluate without IV contrast. 12 mm left axillary node is chronically stable. Esophagus is grossly unremarkable. Lungs/Pleura: Centrilobular and paraseptal emphysema. Smoking related respiratory bronchiolitis. Scattered mucoid impaction. No suspicious pulmonary nodules. No pleural fluid. Minimal debris in the airway. Upper Abdomen: Slight gastric wall thickening. Visualized portions of the liver, gallbladder, adrenal glands, kidneys, spleen, pancreas, stomach and bowel are otherwise grossly unremarkable. No upper abdominal adenopathy. Musculoskeletal: Degenerative changes in the spine.  Osteopenia IMPRESSION: 1. Lung-RADS 1, negative. Continue annual screening with low-dose chest CT without contrast in 12 months. 2. Age advanced coronary artery calcification. 3. Slight gastric wall thickening. 4.  Aortic atherosclerosis (ICD10-I70.0). 5.  Emphysema (ICD10-J43.9). Electronically Signed   By: Shearon Denis M.D.   On: 08/06/2023 13:16   CT ABDOMEN PELVIS W CONTRAST Result Date: 08/02/2023 EXAMINATION: CT ABDOMEN PELVIS W CONTRAST CLINICAL INDICATION: Male, 63 years old. Weight loss, unintended TECHNIQUE: Axial CT of the abdomen and pelvis with 100 cc Isovue  300 intravenous contrast. Multiplanar reformations provided. Unless otherwise specified, incidental thyroid , adrenal, renal lesions do not require dedicated imaging follow up. Additionally, any mentioned pulmonary nodules do not require dedicated imaging follow-up based on the Fleischner guidelines unless otherwise specified. Coronary calcifications are not identified unless otherwise specified. COMPARISON: 06/15/2014 FINDINGS: Regarding findings of the lung bases, please refer to the chest report. The liver appears normal. The gallbladder is normal. The spleen is normal. The pancreas is normal. The adrenals are normal. Bilateral renal cysts are seen. Abdominal aorta is normal in caliber. Scattered  atherosclerotic changes are present. There is an indwelling penetrating IVC filter noted. The urinary bladder is normal. The prostate is mildly enlarged. Large and small bowel loops are otherwise within normal limits. There is no free fluid or pathologic lymphadenopathy by size criteria. There are degenerative changes of the spine and bony pelvis. Lumbosacral fusion noted. 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 IMPRESSION: No acute findings in the abdomen or pelvis. Indwelling penetrating IVC filter. Patient should be considered for removal versus replacement. Various additional findings as above. DOSE REDUCTION: This exam was performed according to our departmental dose-optimization program which includes automated exposure control, adjustment of the mA and/or kV according to patient size and/or use of iterative reconstruction technique. Electronically signed by: [place] Engel MD 08/02/2023 03:46 PM  EDT RP Workstation: [id number]   CT Head Wo Contrast Result Date: 07/19/2023 CLINICAL DATA:  Trauma EXAM: CT HEAD WITHOUT CONTRAST CT MAXILLOFACIAL WITHOUT CONTRAST CT CERVICAL SPINE WITHOUT CONTRAST TECHNIQUE: Multidetector CT imaging of the head, cervical spine, and maxillofacial structures were performed using the standard protocol without intravenous contrast. Multiplanar CT image reconstructions of the cervical spine and maxillofacial structures were also generated. RADIATION DOSE REDUCTION: This exam was performed according to the departmental dose-optimization program which includes automated exposure control, adjustment of the mA and/or kV according to patient size and/or use of iterative reconstruction technique. COMPARISON:  CT head and cervical spine 05/22/2023. FINDINGS: CT HEAD FINDINGS Brain: No evidence of acute infarction, hemorrhage, hydrocephalus, extra-axial collection or mass lesion/mass effect. There is mild diffuse atrophy and moderate patchy periventricular white matter hypodensity  similar to the prior study. 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 Vascular: Atherosclerotic calcifications are present within the cavernous internal carotid arteries. Skull: Normal. Negative for fracture or focal lesion. Other: There is scalp soft tissue swelling and hematoma overlying the posterior left parietal region. CT MAXILLOFACIAL FINDINGS Osseous: There chronic right nasal bone fractures which are similar to the prior study. No acute fractures are seen. No focal osseous lesion. No dislocation. Orbits: Again seen is enlargement of the right lacrimal gland measuring 2.8 x 1.5 cm. This is slightly increased in size when compared to the prior study. There is also soft tissue enlargement at the insertion of the right lateral rectus muscle. This is a new finding. The globes are intact bilaterally. There is no postseptal orbital soft tissue stranding. No acute fracture identified. No osseous in origin Sinuses: There are small air-fluid levels in the bilateral maxillary sinuses and left sphenoid sinus. Mastoid air cells are clear. Soft tissues: Negative. CT CERVICAL SPINE FINDINGS Alignment: Normal. Skull base and vertebrae: No acute fracture. No primary bone lesion or focal pathologic process. The bones are osteopenic. Soft tissues and spinal canal: No prevertebral fluid or swelling. No visible canal hematoma. Disc levels: There are central disc protrusions at C3-C4, C4-C5 and C5-C6 causing mild central canal stenosis. There is mild right neural foraminal stenosis secondary to uncovertebral spurring at C3-C4. Upper chest: Negative. Other: None. IMPRESSION: 1. No acute intracranial process. 2. No acute facial bone fracture. 3. Left parietal scalp soft tissue swelling and hematoma. 4. No acute fracture or traumatic subluxation of the cervical spine. 5. Enlargement of the right lacrimal gland, slightly increased in size when compared to the prior study. There is also soft tissue enlargement  at the insertion of the right lateral rectus muscle, new finding. Findings are nonspecific and may be related to dacryoadenitis, however neoplasm is not excluded. Recommend ophthalmology consultation. 6. Small air-fluid levels in the bilateral maxillary sinuses and left sphenoid sinus. Correlate for acute sinusitis. 7. Degenerative changes of the cervical spine. Electronically Signed   By: Tyron Gallon M.D.   On: 07/19/2023 15:52   CT Cervical Spine Wo Contrast Result Date: 07/19/2023 CLINICAL DATA:  Trauma EXAM: CT HEAD WITHOUT CONTRAST CT MAXILLOFACIAL WITHOUT CONTRAST CT CERVICAL SPINE WITHOUT CONTRAST TECHNIQUE: Multidetector CT imaging of the head, cervical spine, and maxillofacial structures were performed using the standard protocol without intravenous contrast. Multiplanar CT image reconstructions of the cervical spine and maxillofacial structures were also generated. RADIATION DOSE REDUCTION: This exam was performed according to the departmental dose-optimization program which includes automated exposure control, adjustment of the mA and/or kV according to patient size and/or use of iterative reconstruction  technique. COMPARISON:  CT head and cervical spine 05/22/2023. FINDINGS: CT HEAD FINDINGS Brain: No evidence of acute infarction, hemorrhage, hydrocephalus, extra-axial collection or mass lesion/mass effect. There is mild diffuse atrophy and moderate patchy periventricular white matter hypodensity similar to the prior study. 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 Vascular: Atherosclerotic calcifications are present within the cavernous internal carotid arteries. Skull: Normal. Negative for fracture or focal lesion. Other: There is scalp soft tissue swelling and hematoma overlying the posterior left parietal region. CT MAXILLOFACIAL FINDINGS Osseous: There chronic right nasal bone fractures which are similar to the prior study. No acute fractures are seen. No focal osseous  lesion. No dislocation. Orbits: Again seen is enlargement of the right lacrimal gland measuring 2.8 x 1.5 cm. This is slightly increased in size when compared to the prior study. There is also soft tissue enlargement at the insertion of the right lateral rectus muscle. This is a new finding. The globes are intact bilaterally. There is no postseptal orbital soft tissue stranding. No acute fracture identified. No osseous in origin Sinuses: There are small air-fluid levels in the bilateral maxillary sinuses and left sphenoid sinus. Mastoid air cells are clear. Soft tissues: Negative. CT CERVICAL SPINE FINDINGS Alignment: Normal. Skull base and vertebrae: No acute fracture. No primary bone lesion or focal pathologic process. The bones are osteopenic. Soft tissues and spinal canal: No prevertebral fluid or swelling. No visible canal hematoma. Disc levels: There are central disc protrusions at C3-C4, C4-C5 and C5-C6 causing mild central canal stenosis. There is mild right neural foraminal stenosis secondary to uncovertebral spurring at C3-C4. Upper chest: Negative. Other: None. IMPRESSION: 1. No acute intracranial process. 2. No acute facial bone fracture. 3. Left parietal scalp soft tissue swelling and hematoma. 4. No acute fracture or traumatic subluxation of the cervical spine. 5. Enlargement of the right lacrimal gland, slightly increased in size when compared to the prior study. There is also soft tissue enlargement at the insertion of the right lateral rectus muscle, new finding. Findings are nonspecific and may be related to dacryoadenitis, however neoplasm is not excluded. Recommend ophthalmology consultation. 6. Small air-fluid levels in the bilateral maxillary sinuses and left sphenoid sinus. Correlate for acute sinusitis. 7. Degenerative changes of the cervical spine. Electronically Signed   By: Tyron Gallon M.D.   On: 07/19/2023 15:52   CT Maxillofacial Wo Contrast Result Date: 07/19/2023 CLINICAL DATA:   Trauma EXAM: CT HEAD WITHOUT CONTRAST CT MAXILLOFACIAL WITHOUT CONTRAST CT CERVICAL SPINE WITHOUT CONTRAST TECHNIQUE: Multidetector CT imaging of the head, cervical spine, and maxillofacial structures were performed using the standard protocol without intravenous contrast. Multiplanar CT image reconstructions of the cervical spine and maxillofacial structures were also generated. RADIATION DOSE REDUCTION: This exam was performed according to the departmental dose-optimization program which includes automated exposure control, adjustment of the mA and/or kV according to patient size and/or use of iterative reconstruction technique. COMPARISON:  CT head and cervical spine 05/22/2023. FINDINGS: CT HEAD FINDINGS Brain: No evidence of acute infarction, hemorrhage, hydrocephalus, extra-axial collection or mass lesion/mass effect. There is mild diffuse atrophy and moderate patchy periventricular white matter hypodensity similar to the prior study. 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 Vascular: Atherosclerotic calcifications are present within the cavernous internal carotid arteries. Skull: Normal. Negative for fracture or focal lesion. Other: There is scalp soft tissue swelling and hematoma overlying the posterior left parietal region. CT MAXILLOFACIAL FINDINGS Osseous: There chronic right nasal bone fractures which  are similar to the prior study. No acute fractures are seen. No focal osseous lesion. No dislocation. Orbits: Again seen is enlargement of the right lacrimal gland measuring 2.8 x 1.5 cm. This is slightly increased in size when compared to the prior study. There is also soft tissue enlargement at the insertion of the right lateral rectus muscle. This is a new finding. The globes are intact bilaterally. There is no postseptal orbital soft tissue stranding. No acute fracture identified. No osseous in origin Sinuses: There are small air-fluid levels in the bilateral maxillary sinuses and  left sphenoid sinus. Mastoid air cells are clear. Soft tissues: Negative. CT CERVICAL SPINE FINDINGS Alignment: Normal. Skull base and vertebrae: No acute fracture. No primary bone lesion or focal pathologic process. The bones are osteopenic. Soft tissues and spinal canal: No prevertebral fluid or swelling. No visible canal hematoma. Disc levels: There are central disc protrusions at C3-C4, C4-C5 and C5-C6 causing mild central canal stenosis. There is mild right neural foraminal stenosis secondary to uncovertebral spurring at C3-C4. Upper chest: Negative. Other: None. IMPRESSION: 1. No acute intracranial process. 2. No acute facial bone fracture. 3. Left parietal scalp soft tissue swelling and hematoma. 4. No acute fracture or traumatic subluxation of the cervical spine. 5. Enlargement of the right lacrimal gland, slightly increased in size when compared to the prior study. There is also soft tissue enlargement at the insertion of the right lateral rectus muscle, new finding. Findings are nonspecific and may be related to dacryoadenitis, however neoplasm is not excluded. Recommend ophthalmology consultation. 6. Small air-fluid levels in the bilateral maxillary sinuses and left sphenoid sinus. Correlate for acute sinusitis. 7. Degenerative changes of the cervical spine. Electronically Signed   By: Tyron Gallon M.D.   On: 07/19/2023 15:52    ASSESSMENT & PLAN [name] is a 63 y.o. male who presents to the hematology clinic for evaluation for abnormal SPEP that revealed polyclonal gammopathy.     #Polyclonal gammopathy: --Discussed underlying causes for polyclonal gammopathy including paraproteinemia, inflammatory conditions, infectious process and malignancies. --Per record review, patient is up-to-date on his age-appropriate cancer screenings including normal PSA (07/19/2023), CT lung cancer screening without any suspicious lung nodules (08/02/2023), colonoscopy with several polyps identified and  resected (05/01/2018). --We recommend a full monoclonal gammopathy workup which includes serum SPEP, serum free light chains, LDH, beta-2  microglobulin, ACE, sedimentation rate and C-reactive protein levels.  In addition we will order a 24-hour UPEP as well.   -- Based on above workup we will determine if there is a role for secondary evaluation with bone marrow biopsy.  If there is no evidence of paraproteinemia identified in her workup, no further hematologic workup is recommended at this time. --RTC in 2 weeks for virtual visit with Dr. Salomon Cree   #Enlargement of right lacrimal gland: --Patient will undergo evaluation with ophthalmology team in the near future.  #Age-appropriate cancer screenings: -- All up-to-date but is due for repeat colonoscopy.  No orders of the defined types were placed in this encounter.   All questions were answered. The patient knows to call the clinic with any problems, questions or concerns.  I have spent a total of 60 minutes minutes of face-to-face and non-face-to-face time, preparing to see the patient, obtaining and/or reviewing separately obtained history, performing a medically appropriate examination, counseling and educating the patient, ordering medications/tests/procedures, referring and communicating with other health care professionals, documenting clinical information in the electronic health record, independently interpreting results and communicating results to the  patient, and care coordination.   [name] Hearing, PA-C Department of Hematology/Oncology [facility] Phone: [phone]  Patient was seen with Dr. Salomon Cree.   ADDENDUM  .Patient was Personally and independently interviewed, examined and relevant elements of the history of present illness were reviewed in details and an assessment and plan was created. All elements of the patient's history of present illness , assessment and plan were discussed in details  with [name] Hearing, PA-C. The above documentation reflects our combined findings assessment and plan.   Gautam Kale MD MS

## 2023-08-07 ENCOUNTER — Inpatient Hospital Stay

## 2023-08-07 ENCOUNTER — Encounter: Payer: Self-pay | Admitting: Physician Assistant

## 2023-08-07 ENCOUNTER — Inpatient Hospital Stay: Attending: Physician Assistant | Admitting: Physician Assistant

## 2023-08-07 VITALS — BP 120/80 | HR 77 | Temp 97.2°F | Resp 14 | Wt 160.7 lb

## 2023-08-07 DIAGNOSIS — H04031 Chronic enlargement of right lacrimal gland: Secondary | ICD-10-CM | POA: Insufficient documentation

## 2023-08-07 DIAGNOSIS — F17210 Nicotine dependence, cigarettes, uncomplicated: Secondary | ICD-10-CM | POA: Insufficient documentation

## 2023-08-07 DIAGNOSIS — D890 Polyclonal hypergammaglobulinemia: Secondary | ICD-10-CM | POA: Insufficient documentation

## 2023-08-07 DIAGNOSIS — Z1211 Encounter for screening for malignant neoplasm of colon: Secondary | ICD-10-CM

## 2023-08-07 LAB — CMP (CANCER CENTER ONLY)
ALT: 19 U/L (ref 0–44)
AST: 28 U/L (ref 15–41)
Albumin: 3.9 g/dL (ref 3.5–5.0)
Alkaline Phosphatase: 104 U/L (ref 38–126)
Anion gap: 8 (ref 5–15)
BUN: 8 mg/dL (ref 8–23)
CO2: 26 mmol/L (ref 22–32)
Calcium: 9.3 mg/dL (ref 8.9–10.3)
Chloride: 107 mmol/L (ref 98–111)
Creatinine: 0.69 mg/dL (ref 0.61–1.24)
GFR, Estimated: >60 mL/min (ref >60)
Glucose, Bld: 84 mg/dL (ref 70–99)
Potassium: 3.8 mmol/L (ref 3.5–5.1)
Sodium: 141 mmol/L (ref 135–145)
Total Bilirubin: 0.5 mg/dL (ref 0.0–1.2)
Total Protein: 8.7 g/dL — ABNORMAL HIGH (ref 6.5–8.1)

## 2023-08-07 LAB — CBC WITH DIFFERENTIAL (CANCER CENTER ONLY)
Abs Immature Granulocytes: 0.02 10*3/uL (ref 0.00–0.07)
Basophils Absolute: 0 10*3/uL (ref 0.0–0.1)
Basophils Relative: 1 %
Eosinophils Absolute: 0.3 10*3/uL (ref 0.0–0.5)
Eosinophils Relative: 9 %
HCT: 38.1 % — ABNORMAL LOW (ref 39.0–52.0)
Hemoglobin: 12.8 g/dL — ABNORMAL LOW (ref 13.0–17.0)
Immature Granulocytes: 1 %
Lymphocytes Relative: 43 %
Lymphs Abs: 1.8 10*3/uL (ref 0.7–4.0)
MCH: 32.7 pg (ref 26.0–34.0)
MCHC: 33.6 g/dL (ref 30.0–36.0)
MCV: 97.4 fL (ref 80.0–100.0)
Monocytes Absolute: 0.4 10*3/uL (ref 0.1–1.0)
Monocytes Relative: 9 %
Neutro Abs: 1.5 10*3/uL — ABNORMAL LOW (ref 1.7–7.7)
Neutrophils Relative %: 37 %
Platelet Count: 155 10*3/uL (ref 150–400)
RBC: 3.91 MIL/uL — ABNORMAL LOW (ref 4.22–5.81)
RDW: 14.9 % (ref 11.5–15.5)
WBC Count: 4 10*3/uL (ref 4.0–10.5)
nRBC: 0 % (ref 0.0–0.2)

## 2023-08-07 LAB — SEDIMENTATION RATE: Sed Rate: 16 mm/h (ref 0–16)

## 2023-08-07 LAB — C-REACTIVE PROTEIN: CRP: <0.5 mg/dL (ref <1.0)

## 2023-08-07 LAB — LACTATE DEHYDROGENASE: LDH: 257 U/L — ABNORMAL HIGH (ref 98–192)

## 2023-08-08 LAB — KAPPA/LAMBDA LIGHT CHAINS
Kappa free light chain: 116 mg/L — ABNORMAL HIGH (ref 3.3–19.4)
Kappa, lambda light chain ratio: 2.28 — ABNORMAL HIGH (ref 0.26–1.65)
Lambda free light chains: 50.8 mg/L — ABNORMAL HIGH (ref 5.7–26.3)

## 2023-08-08 LAB — ANGIOTENSIN CONVERTING ENZYME: Angiotensin-Converting Enzyme: 86 U/L — ABNORMAL HIGH (ref 14–82)

## 2023-08-08 LAB — BETA 2 MICROGLOBULIN, SERUM: Beta-2 Microglobulin: 2.4 mg/L (ref 0.6–2.4)

## 2023-08-10 LAB — MULTIPLE MYELOMA PANEL, SERUM
Albumin SerPl Elph-Mcnc: 3.5 g/dL (ref 2.9–4.4)
Albumin/Glob SerPl: 0.8 (ref 0.7–1.7)
Alpha 1: 0.4 g/dL (ref 0.0–0.4)
Alpha2 Glob SerPl Elph-Mcnc: 0.9 g/dL (ref 0.4–1.0)
B-Globulin SerPl Elph-Mcnc: 1.4 g/dL — ABNORMAL HIGH (ref 0.7–1.3)
Gamma Glob SerPl Elph-Mcnc: 2.3 g/dL — ABNORMAL HIGH (ref 0.4–1.8)
Globulin, Total: 4.9 g/dL — ABNORMAL HIGH (ref 2.2–3.9)
IgA: 209 mg/dL (ref 61–437)
IgG (Immunoglobin G), Serum: 2416 mg/dL — ABNORMAL HIGH (ref 603–1613)
IgM (Immunoglobulin M), Srm: 45 mg/dL (ref 20–172)
Total Protein ELP: 8.4 g/dL (ref 6.0–8.5)

## 2023-08-17 ENCOUNTER — Telehealth: Payer: Self-pay | Admitting: Family Medicine

## 2023-08-17 NOTE — Telephone Encounter (Signed)
 Copied from CRM [id number]. Topic: General - Other >> Aug 17, 2023 12:43 PM Rachelle R wrote:  Reason for CRM: Veryl Gottron from [facility] health calling regards to patients medications, losartan  (COZAAR ) 25 MG tablet and empagliflozin  (JARDIANCE ) 10 MG TABS tablet. States he is behind on his refills. They have attempted to reach out to the patient to check on him but have had no luck getting a hold of him. Asking if the office is able to reach out.  Leo can be reached at [phone]

## 2023-08-21 ENCOUNTER — Inpatient Hospital Stay (HOSPITAL_BASED_OUTPATIENT_CLINIC_OR_DEPARTMENT_OTHER): Admitting: Hematology

## 2023-08-21 DIAGNOSIS — D890 Polyclonal hypergammaglobulinemia: Secondary | ICD-10-CM | POA: Diagnosis not present

## 2023-08-21 NOTE — Progress Notes (Signed)
 HEMATOLOGY/ONCOLOGY PHONE VISIT NOTE  Date of Service: 08/21/2023  Patient Care Team: Joaquin Mulberry, MD as PCP - General (Family Medicine) Euell Herrlich, MD as PCP - Cardiology (Cardiology) Auther Bo, RN as Nurse Navigator  CHIEF COMPLAINTS/PURPOSE OF CONSULTATION:  Polyclonal gammopathy   HISTORY OF PRESENTING ILLNESS:  [name] is a wonderful 63 y.o. male who has previously been seen by [facility] PA for evaluation and management of Polyclonal gammopathy.  Patient noted to have evidence of previously elevated total protein based on labs from 03/19/2023. Repeat labs from one month ago showed undetectable M protein, but there were findings of elevated polyclonal increase in 1 or more immunoglobulins predominantly IgG measuring 2499 mg/dL.   He was seen by [facility] PA on 08/07/2023 and labs showed IgG 2416 mg/dL. Patient reported some fatigue, 20-pound weight loss over 4 months attributed to poor dentition, chronic bilateral leg weakness impacting his walking, a fall, and right eyelid swelling.  I connected with [name] on 08/21/23 at  3:30 PM EDT by telephone visit and verified that I am speaking with the correct person using two identifiers.   I discussed the limitations, risks, security and privacy concerns of performing an evaluation and management service by telemedicine and the availability of in-person appointments. I also discussed with the patient that there may be a patient responsible charge related to this service. The patient expressed understanding and agreed to proceed.   Other persons participating in the visit and their role in the encounter: none   Patient's location: home  Provider's location: [facility]   Chief Complaint: Polyclonal gammopathy   He reports having a fall yesterday. Patient complains of bilateral leg weakness present for a while. Patient connected with his PCP regarding his leg weakness.   He reports that his swelling above  the right eye has been stable in size. Patient reports that he will be seeing an eye doctor on 6/10 in [place] for evaluation of swelling above the right eye.   Patient notes hx of bilateral eye swelling 25 years ago, which was treated with steroids for two years and improved his swelling.   MEDICAL HISTORY:  Past Medical History:  Diagnosis Date   Asthma    CAD (coronary artery disease)    a. 09/2013 NSTEMI/PCI: LM nl, LAD 40-50%, D1 100 (2.25 x 28 Vision BMS), LCX min irregs, RI 60-70, 30, RCA 40-50/50-61ms/p, EF 45-50%.  b. cath 12/2014 -occulded BMS in diag, 50% pro to mid LAD, 60% ramus, 30% RCA    Chest pain 08/2015   Chronic diastolic CHF (congestive heart failure) (HCC)    a. 09/2014 EF 45-50% by LV gram;  b. 01/2014 Echo: EF 55-60%, Gr 1 DD.   Cocaine  abuse (HCC)    COPD (chronic obstructive pulmonary disease) (HCC)    DVT (deep venous thrombosis) (HCC)    "I had ~ 10 in each leg" (12/20/2016)   Essential hypertension    Hepatitis C    "clear free over a year now"   High cholesterol    Myocardial infarction [facility]) ~ 2014   Osteoarthritis    a. hands and toes.   Pneumonia    "several times" (12/20/2016)   Pulmonary embolism (HCC)    a. 2012 - s/p IVC filter;  b. prev on eliquis  - noncompliant.   Shortness of breath dyspnea    Sinus headache    Squamous cell skin cancer 07/13/2016   "foot"   Tobacco abuse  SURGICAL HISTORY: Past Surgical History:  Procedure Laterality Date   BACK SURGERY  2022   CARDIAC CATHETERIZATION N/A 01/04/2015   Procedure: Left Heart Cath and Coronary Angiography;  Surgeon: Odie Benne, MD;  Location: [facility] INVASIVE CV LAB;  Service: Cardiovascular;  Laterality: N/A;   CORONARY ANGIOPLASTY WITH STENT PLACEMENT  10/21/2013   BMS to D1   CYST EXCISION N/A 05/10/2016   Procedure: EXCISION OF SEBACEOUS CYST UPPER BACK;  Surgeon: Dareen Ebbing, MD;  Location: [facility] OR;  Service: General;  Laterality: N/A;   FOOT SURGERY Right ~ 06/2016   "said  there was skin cancer on it"   LEFT HEART CATH AND CORONARY ANGIOGRAPHY N/A 01/11/2023   Procedure: LEFT HEART CATH AND CORONARY ANGIOGRAPHY;  Surgeon: Odie Benne, MD;  Location: [facility] INVASIVE CV LAB;  Service: Cardiovascular;  Laterality: N/A;   LEFT HEART CATHETERIZATION WITH CORONARY ANGIOGRAM N/A 10/21/2013   Procedure: LEFT HEART CATHETERIZATION WITH CORONARY ANGIOGRAM;  Surgeon: Arleen Lacer, MD;  Location: [facility] CATH LAB;  Service: Cardiovascular;  Laterality: N/A;   PAROTIDECTOMY Left 03/19/2023   Procedure: PAROTIDECTOMY;  Surgeon: Janita Mellow, MD;  Location: [facility] OR;  Service: ENT;  Laterality: Left;   VENA CAVA FILTER PLACEMENT  ~ 2007-~ 2017   "1 in my neck; 2 in my wrist"    SOCIAL HISTORY: Social History   Socioeconomic History   Marital status: Single    Spouse name: Not on file   Number of children: Not on file   Years of education: Not on file   Highest education level: Not on file  Occupational History   Occupation: unemployed  Tobacco Use   Smoking status: Every Day    Current packs/day: 1.00    Average packs/day: 1 pack/day for 35.0 years (35.0 ttl pk-yrs)    Types: Cigarettes    Passive exposure: Never   Smokeless tobacco: Never   Tobacco comments:    Up to 5 cigarettes a day.   Vaping Use   Vaping status: Never Used  Substance and Sexual Activity   Alcohol use: Yes    Alcohol/week: 6.0 standard drinks of alcohol    Types: 2 Cans of beer, 4 Shots of liquor per week   Drug use: Not Currently    Types: Cocaine     Comment: 12/20/2016 "might have used some the other day; I'm not sure"   Sexual activity: Not Currently  Other Topics Concern   Not on file  Social History Narrative   Lives in [place].   Social Drivers of Corporate investment banker Strain: Low Risk  (06/21/2022)   Overall Financial Resource Strain (CARDIA)    Difficulty of Paying Living Expenses: Not hard at all  Food Insecurity: No Food Insecurity (03/19/2023)   Hunger Vital Sign     Worried About Running Out of Food in the Last Year: Never true    Ran Out of Food in the Last Year: Never true  Transportation Needs: Unmet Transportation Needs (08/07/2023)   PRAPARE - Administrator, Civil Service (Medical): Yes    Lack of Transportation (Non-Medical): No  Physical Activity: Inactive (06/21/2022)   Exercise Vital Sign    Days of Exercise per Week: 0 days    Minutes of Exercise per Session: 0 min  Stress: No Stress Concern Present (06/21/2022)   [employer] of Occupational Health - Occupational Stress Questionnaire    Feeling of Stress : Not at all  Social Connections: Not on file  Intimate  Partner Violence: Not At Risk (03/19/2023)   Humiliation, Afraid, Rape, and Kick questionnaire    Fear of Current or Ex-Partner: No    Emotionally Abused: No    Physically Abused: No    Sexually Abused: No    FAMILY HISTORY: Family History  Problem Relation Age of Onset   Asthma Mother    Allergies Mother    Allergies Sister    Allergies Brother    Deep vein thrombosis Brother        two brothers with recurrent DVT   Heart attack Father        a.60s b. deceased in his 77s   Coronary artery disease Father    Colon cancer Neg Hx    Liver cancer Neg Hx    Esophageal cancer Neg Hx    Colon polyps Neg Hx    Rectal cancer Neg Hx    Pancreatic cancer Neg Hx    Stomach cancer Neg Hx     ALLERGIES:  is allergic to atorvastatin .  MEDICATIONS:  Current Outpatient Medications  Medication Sig Dispense Refill   albuterol  (PROAIR  HFA) 108 (90 Base) MCG/ACT inhaler Inhale 2 puffs into the lungs every 6 (six) hours as needed for wheezing or shortness of breath. 6.7 g 6   albuterol  (PROVENTIL ) (2.5 MG/3ML) 0.083% nebulizer solution Take 3 mLs (2.5 mg total) by nebulization every 6 (six) hours as needed for wheezing or shortness of breath. 90 mL 6   apixaban  (ELIQUIS ) 5 MG TABS tablet Take 1 tablet (5 mg total) by mouth 2 (two) times daily. 180 tablet 1    azelastine  (ASTELIN ) 0.1 % nasal spray Place 2 sprays into both nostrils 2 (two) times daily as needed for rhinitis. Use in each nostril as directed     Azelastine -Fluticasone  137-50 MCG/ACT SUSP Place 2 sprays into the nose every 12 (twelve) hours. 23 g 5   buPROPion  (WELLBUTRIN  XL) 150 MG 24 hr tablet Take 1 tablet (150 mg total) by mouth daily. For smoking cessation. 90 tablet 1   cetirizine  (ZYRTEC ) 10 MG tablet Take 1 tablet (10 mg total) by mouth daily. 90 tablet 1   empagliflozin  (JARDIANCE ) 10 MG TABS tablet Take 1 tablet (10 mg total) by mouth daily before breakfast. 90 tablet 3   Evolocumab  140 MG/ML SOAJ Inject 140 mg into the skin every 14 (fourteen) days. 6 mL 3   ezetimibe  (ZETIA ) 10 MG tablet Take 1 tablet (10 mg total) by mouth daily. 90 tablet 3   Fluticasone -Umeclidin-Vilant (TRELEGY ELLIPTA ) 100-62.5-25 MCG/ACT AEPB Inhale 1 puff into the lungs daily. 60 each 0   furosemide  (LASIX ) 40 MG tablet Take 1 tablet (40 mg total) by mouth daily as needed (May take 40mg  Daily for Swelling and Shortness of breath). 90 tablet 1   gabapentin  (NEURONTIN ) 100 MG capsule Take 1 capsule (100 mg total) by mouth at bedtime. 90 capsule 1   HYDROcodone -acetaminophen  (NORCO) 7.5-325 MG tablet Take 1 tablet by mouth every 6 (six) hours as needed for moderate pain (pain score 4-6). 20 tablet 0   isosorbide  mononitrate (IMDUR ) 30 MG 24 hr tablet Take 1 tablet (30 mg total) by mouth daily. 90 tablet 1   losartan  (COZAAR ) 25 MG tablet Take 1 tablet (25 mg total) by mouth daily. 90 tablet 3   metoprolol  succinate (TOPROL -XL) 25 MG 24 hr tablet Take 1 tablet (25 mg total) by mouth daily. 90 tablet 1   Misc. Devices MISC Rolling walker with seat.  Diagnosis-recurrent falls 1 each 0  montelukast  (SINGULAIR ) 10 MG tablet Take 1 tablet (10 mg total) by mouth at bedtime. 90 tablet 1   nitroGLYCERIN  (NITROSTAT ) 0.4 MG SL tablet Place 1 tablet (0.4 mg total) under the tongue every 5 (five) minutes as needed for  chest pain. 25 tablet 3   ondansetron  (ZOFRAN -ODT) 4 MG disintegrating tablet Take 1 tablet (4 mg total) by mouth every 8 (eight) hours as needed for nausea or vomiting. 20 tablet 0   pantoprazole  (PROTONIX ) 40 MG tablet Take 1 tablet (40 mg total) by mouth daily. 90 tablet 1   potassium chloride  SA (KLOR-CON  M) 20 MEQ tablet Take 1 tablet (20 mEq total) by mouth daily. 5 tablet 0   No current facility-administered medications for this visit.    REVIEW OF SYSTEMS:    10 Point review of Systems was done is negative except as noted above.  PHYSICAL EXAMINATION: TELEMEDICINE VISIT  LABORATORY DATA:  I have reviewed the data as listed  .    Latest Ref Rng & Units 08/07/2023   11:05 AM 05/22/2023    9:40 AM 03/19/2023    1:29 PM  CBC  WBC 4.0 - 10.5 K/uL 4.0  5.7  5.1   Hemoglobin 13.0 - 17.0 g/dL 16.1  09.6  04.5   Hematocrit 39.0 - 52.0 % 38.1  42.9  47.3   Platelets 150 - 400 K/uL 155  251  144     .    Latest Ref Rng & Units 08/07/2023   11:05 AM 07/19/2023   12:32 PM 05/22/2023    9:40 AM  CMP  Glucose 70 - 99 mg/dL 84  71  409   BUN 8 - 23 mg/dL 8  10  10    Creatinine 0.61 - 1.24 mg/dL 8.11  9.14  7.82   Sodium 135 - 145 mmol/L 141  141  138   Potassium 3.5 - 5.1 mmol/L 3.8  4.6  2.9   Chloride 98 - 111 mmol/L 107  104  108   CO2 22 - 32 mmol/L 26  19  22    Calcium  8.9 - 10.3 mg/dL 9.3  9.7  8.8   Total Protein 6.5 - 8.1 g/dL 8.7  7.7    Total Bilirubin 0.0 - 1.2 mg/dL 0.5  0.4    Alkaline Phos 38 - 126 U/L 104  108    AST 15 - 41 U/L 28  42    ALT 0 - 44 U/L 19  20       RADIOGRAPHIC STUDIES: I have personally reviewed the radiological images as listed and agreed with the findings in the report. CT CHEST LUNG CANCER SCREENING LOW DOSE WO CONTRAST Result Date: 08/06/2023 CLINICAL DATA:  Current 40 pack-year smoker. EXAM: CT CHEST WITHOUT CONTRAST LOW-DOSE FOR LUNG CANCER SCREENING TECHNIQUE: Multidetector CT imaging of the chest was performed following the standard  protocol without IV contrast. RADIATION DOSE REDUCTION: This exam was performed according to the departmental dose-optimization program which includes automated exposure control, adjustment of the mA and/or kV according to patient size and/or use of iterative reconstruction technique. COMPARISON:  01/11/2021. FINDINGS: Cardiovascular: Atherosclerotic calcification of the aorta with age advanced involvement of the coronary arteries. Heart size normal. No pericardial effusion. Mediastinum/Nodes: No pathologically enlarged mediastinal or axillary lymph nodes. Hilar regions are difficult to definitively evaluate without IV contrast. 12 mm left axillary node is chronically stable. Esophagus is grossly unremarkable. Lungs/Pleura: Centrilobular and paraseptal emphysema. Smoking related respiratory bronchiolitis. Scattered mucoid impaction. No suspicious pulmonary nodules.  No pleural fluid. Minimal debris in the airway. Upper Abdomen: Slight gastric wall thickening. Visualized portions of the liver, gallbladder, adrenal glands, kidneys, spleen, pancreas, stomach and bowel are otherwise grossly unremarkable. No upper abdominal adenopathy. Musculoskeletal: Degenerative changes in the spine.  Osteopenia IMPRESSION: 1. Lung-RADS 1, negative. Continue annual screening with low-dose chest CT without contrast in 12 months. 2. Age advanced coronary artery calcification. 3. Slight gastric wall thickening. 4.  Aortic atherosclerosis (ICD10-I70.0). 5.  Emphysema (ICD10-J43.9). Electronically Signed   By: Shearon Denis M.D.   On: 08/06/2023 13:16   CT ABDOMEN PELVIS W CONTRAST Result Date: 08/02/2023 EXAMINATION: CT ABDOMEN PELVIS W CONTRAST CLINICAL INDICATION: Male, 63 years old. Weight loss, unintended TECHNIQUE: Axial CT of the abdomen and pelvis with 100 cc Isovue  300 intravenous contrast. Multiplanar reformations provided. Unless otherwise specified, incidental thyroid , adrenal, renal lesions do not require dedicated imaging  follow up. Additionally, any mentioned pulmonary nodules do not require dedicated imaging follow-up based on the Fleischner guidelines unless otherwise specified. Coronary calcifications are not identified unless otherwise specified. COMPARISON: 06/15/2014 FINDINGS: Regarding findings of the lung bases, please refer to the chest report. The liver appears normal. The gallbladder is normal. The spleen is normal. The pancreas is normal. The adrenals are normal. Bilateral renal cysts are seen. Abdominal aorta is normal in caliber. Scattered atherosclerotic changes are present. There is an indwelling penetrating IVC filter noted. The urinary bladder is normal. The prostate is mildly enlarged. Large and small bowel loops are otherwise within normal limits. There is no free fluid or pathologic lymphadenopathy by size criteria. There are degenerative changes of the spine and bony pelvis. Lumbosacral fusion noted. 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 IMPRESSION: No acute findings in the abdomen or pelvis. Indwelling penetrating IVC filter. Patient should be considered for removal versus replacement. Various additional findings as above. DOSE REDUCTION: This exam was performed according to our departmental dose-optimization program which includes automated exposure control, adjustment of the mA and/or kV according to patient size and/or use of iterative reconstruction technique. Electronically signed by: [place] Engel MD 08/02/2023 03:46 PM EDT RP Workstation: [id number]    ASSESSMENT & PLAN:  63 y.o. male with:  Polyclonal gammopathy - igG 2,416 mg/dL Lacrimal gland enlargement  PLAN:  -the results of his recent lab testing was discussed with him in detail -CBC normal -CMP stable with no high calcium  levels or kidney changes concerning for multiple myeloma -CT chest scan for lung cancer screening showed no obvious lung cancer -CT abdomen and pelvis scan did not show any obvious tumors -his abnormal  antibodies are not reflecting as an obvious cancer of the bone marrow at this time -igG 2,416 mg/dL two weeks ago -discussed that patient was found to have multiple different antibodies that were increased, which is more consistent with an inflammatory process, which is likely the same process involving Lacrimal gland enlargement -Angiotensin converting enzyme elevated at 86 U/L, which sometimes suggests sarcoidosis -patient shall connect with his eye doctor on 6/10 to evaluate enlarged Lacrimal gland and possibly consider biopsying the area. Discussed that if his biopsy of the lacrimal gland shows lympha or some tumor, then may need to see him back  FOLLOW-UP: RTC with PCP RTC with Oncology if any concerning findings on lacrimal gland biopsy  The total time spent in the appointment was 20 minutes* .  All of the patient's questions were answered with apparent satisfaction. The patient knows to call the clinic with any problems, questions or concerns.  Jacquelyn Matt MD MS AAHIVMS [facility] Hematology/Oncology Physician [facility]  .*Total Encounter Time as defined by the Centers for Medicare and Medicaid Services includes, in addition to the face-to-face time of a patient visit (documented in the note above) non-face-to-face time: obtaining and reviewing outside history, ordering and reviewing medications, tests or procedures, care coordination (communications with other health care professionals or caregivers) and documentation in the medical record.    I,Mitra Faeizi,acting as a Neurosurgeon for Jacquelyn Matt, MD.,have documented all relevant documentation on the behalf of Jacquelyn Matt, MD,as directed by  Jacquelyn Matt, MD while in the presence of Jacquelyn Matt, MD.  .I have reviewed the above documentation for accuracy and completeness, and I agree with the above. .Alicen Donalson Kishore Jeryl Wilbourn MD

## 2023-08-22 ENCOUNTER — Telehealth: Admitting: Hematology

## 2023-08-24 ENCOUNTER — Telehealth: Payer: Self-pay | Admitting: Family Medicine

## 2023-08-24 NOTE — Telephone Encounter (Signed)
 Copied from CRM [id number]. Topic: Referral - Status >> Aug 24, 2023  3:58 PM Ary Bitter R wrote:  Reason for CRM: Pt is asking for an update on the referral that the doctor was to send about his legs.   He also mentioned that the cancer center will be contacting the doctor for a further discussion about whether he has cancer or not.   Please f/u at [phone].

## 2023-08-27 NOTE — Telephone Encounter (Signed)
 Patient was called and informed of contact information for [facility].

## 2023-08-31 ENCOUNTER — Other Ambulatory Visit: Payer: Self-pay

## 2023-08-31 ENCOUNTER — Other Ambulatory Visit (HOSPITAL_COMMUNITY): Payer: Self-pay

## 2023-08-31 ENCOUNTER — Other Ambulatory Visit: Payer: Self-pay | Admitting: Family Medicine

## 2023-08-31 MED ORDER — POTASSIUM CHLORIDE CRYS ER 20 MEQ PO TBCR
20.0000 meq | EXTENDED_RELEASE_TABLET | Freq: Every day | ORAL | 0 refills | Status: AC
  Filled 2023-08-31 – 2023-09-07 (×3): qty 5, 5d supply, fill #0

## 2023-09-03 ENCOUNTER — Telehealth: Payer: Self-pay | Admitting: Pharmacy Technician

## 2023-09-03 NOTE — Telephone Encounter (Signed)
 Received notification Repatha  was denied due to not receiving information per insurance. I faxed visit notes and labs for the repatha  to insurance.

## 2023-09-04 ENCOUNTER — Ambulatory Visit
Admission: RE | Admit: 2023-09-04 | Discharge: 2023-09-04 | Disposition: A | Discharge status: Home / Self Care | Source: Ambulatory Visit | Attending: Family Medicine | Admitting: Family Medicine

## 2023-09-04 ENCOUNTER — Telehealth: Payer: Self-pay | Admitting: *Deleted

## 2023-09-04 ENCOUNTER — Other Ambulatory Visit (HOSPITAL_COMMUNITY): Payer: Self-pay

## 2023-09-04 DIAGNOSIS — Z95828 Presence of other vascular implants and grafts: Secondary | ICD-10-CM | POA: Diagnosis not present

## 2023-09-04 DIAGNOSIS — H04021 Chronic dacryoadenitis, right lacrimal gland: Secondary | ICD-10-CM | POA: Diagnosis not present

## 2023-09-04 DIAGNOSIS — Z7901 Long term (current) use of anticoagulants: Secondary | ICD-10-CM | POA: Diagnosis not present

## 2023-09-04 DIAGNOSIS — D472 Monoclonal gammopathy: Secondary | ICD-10-CM | POA: Diagnosis not present

## 2023-09-04 NOTE — Telephone Encounter (Signed)
 Dr. Nereida Banning tried calling [name] several times today to discuss the IVC Filter that was placed 04-09-2007.  Mr. Anstead was aware Dr. Nereida Banning was going to call.  No answer.vm

## 2023-09-05 ENCOUNTER — Other Ambulatory Visit (HOSPITAL_COMMUNITY): Payer: Self-pay

## 2023-09-05 NOTE — Telephone Encounter (Signed)
 I called his insurance to check on appeal and they made me leave a message on the appeal line. I called [phone]

## 2023-09-06 ENCOUNTER — Other Ambulatory Visit (HOSPITAL_COMMUNITY): Payer: Self-pay

## 2023-09-06 NOTE — Telephone Encounter (Signed)
 This time called [phone] and they said they did receive our appeal and the appeal is still in process

## 2023-09-06 NOTE — Telephone Encounter (Signed)
 Faxed more information as requested

## 2023-09-07 ENCOUNTER — Other Ambulatory Visit (HOSPITAL_COMMUNITY): Payer: Self-pay

## 2023-09-07 ENCOUNTER — Other Ambulatory Visit: Payer: Self-pay

## 2023-09-07 NOTE — Telephone Encounter (Signed)
 Pharmacy Patient Advocate Encounter  Received notification from [employer] that Prior Authorization for repatha  140mg /ml has been APPROVED from 06/08/23 to 09/05/24. Spoke to pharmacy to process.Copay is $0.00.   He would like mailed from [place] [facility]. Sent staff a message

## 2023-09-10 ENCOUNTER — Other Ambulatory Visit: Payer: Self-pay

## 2023-09-12 ENCOUNTER — Encounter: Payer: Self-pay | Admitting: Pharmacist

## 2023-09-12 ENCOUNTER — Other Ambulatory Visit (HOSPITAL_BASED_OUTPATIENT_CLINIC_OR_DEPARTMENT_OTHER): Admitting: Pharmacist

## 2023-09-12 DIAGNOSIS — E785 Hyperlipidemia, unspecified: Secondary | ICD-10-CM

## 2023-09-12 DIAGNOSIS — E119 Type 2 diabetes mellitus without complications: Secondary | ICD-10-CM

## 2023-09-12 DIAGNOSIS — Z7984 Long term (current) use of oral hypoglycemic drugs: Secondary | ICD-10-CM

## 2023-09-12 NOTE — Progress Notes (Signed)
 Pharmacy Quality Measure Review  This patient is appearing on a report for adherence measure for cholesterol (statin) and diabetes medications this calendar year.   Medication: empagliflozin  Last fill date: 08/31/2023 for 90 day supply  Insurance report was not up to date. No action needed at this time.   Medication: losartan  Last fill date: 08/31/2023 for 90 day supply  Insurance report was not up to date. No action needed at this time.   Reminders set for next refill.   Marene Shape, PharmD, Becky Bowels, CPP Clinical Pharmacist [facility] [phone]

## 2023-09-17 ENCOUNTER — Telehealth: Payer: Self-pay | Admitting: Neurology

## 2023-09-17 NOTE — Telephone Encounter (Signed)
 Appointment details confirmed

## 2023-09-18 ENCOUNTER — Encounter: Payer: Self-pay | Admitting: Family Medicine

## 2023-09-18 ENCOUNTER — Ambulatory Visit: Attending: Family Medicine | Admitting: Family Medicine

## 2023-09-18 VITALS — BP 102/68 | HR 78 | Ht 74.0 in | Wt 159.8 lb

## 2023-09-18 DIAGNOSIS — D890 Polyclonal hypergammaglobulinemia: Secondary | ICD-10-CM | POA: Diagnosis not present

## 2023-09-18 DIAGNOSIS — R634 Abnormal weight loss: Secondary | ICD-10-CM

## 2023-09-18 DIAGNOSIS — H04001 Unspecified dacryoadenitis, right lacrimal gland: Secondary | ICD-10-CM

## 2023-09-18 DIAGNOSIS — R29898 Other symptoms and signs involving the musculoskeletal system: Secondary | ICD-10-CM | POA: Diagnosis not present

## 2023-09-18 NOTE — Progress Notes (Signed)
 Subjective:  Patient ID: [name], male    DOB: 1960/07/08  Age: 63 y.o. MRN: [id number]  CC: Medical Management of Chronic Issues     Discussed the use of AI scribe software for clinical note transcription with the patient, who gave verbal consent to proceed.  History of Present Illness [name] is a 63 year old male with a history of COPD, chronic sinusitis, recurrent DVT and PE (on anticoagulation with Eliquis ),  coronary artery disease (status post stent, last cardiac cath from 12/2022 revealed 20% proximal LCx lesion, 30% proximal RCA lesion, 30% mid to distal RCA lesion, 100% occluded D1, 50% proximal to mid LAD lesion and 50% ramus lesion, medical management recommended),  (EF 40-45% from 06/2022),tobacco abuse, status post posterior lumbar interbody fusion in 10/2020, history of parotidectomy in 02/2023 (pathology revealed Warthin's tumor), Nicotine  dependence (about 10 Cigarettes/day >61yrs) who presents for follow-up regarding his upcoming eye surgery and leg weakness.  He is scheduled for eye surgery next month due to chronic dacryoadenitis, with a biopsy planned to investigate the inflammation.  He experiences persistent leg weakness, making it difficult to stand from a seated position, and prefers standing. He has appointments with [facility] Neurology on July 3rd and a vascular specialist to evaluate his leg condition.  He has experienced significant weight loss, leading to a referral to the cancer center. CT scans of the lungs and abdomen show no signs of cancer. Blood tests indicate polyclonal gammopathy, with the significance yet to be determined.  No chest pain, shortness of breath, or palpitations.    Past Medical History:  Diagnosis Date   Asthma    CAD (coronary artery disease)    a. 09/2013 NSTEMI/PCI: LM nl, LAD 40-50%, D1 100 (2.25 x 28 Vision BMS), LCX min irregs, RI 60-70, 30, RCA 40-50/50-67ms/p, EF 45-50%.  b. cath 12/2014 -occulded BMS in diag,  50% pro to mid LAD, 60% ramus, 30% RCA    Chest pain 08/2015   Chronic diastolic CHF (congestive heart failure) (HCC)    a. 09/2014 EF 45-50% by LV gram;  b. 01/2014 Echo: EF 55-60%, Gr 1 DD.   Cocaine  abuse (HCC)    COPD (chronic obstructive pulmonary disease) (HCC)    DVT (deep venous thrombosis) (HCC)    I had ~ 10 in each leg (12/20/2016)   Essential hypertension    Hepatitis C    clear free over a year now   High cholesterol    Myocardial infarction [facility]) ~ 2014   Osteoarthritis    a. hands and toes.   Pneumonia    several times (12/20/2016)   Pulmonary embolism (HCC)    a. 2012 - s/p IVC filter;  b. prev on eliquis  - noncompliant.   Shortness of breath dyspnea    Sinus headache    Squamous cell skin cancer 07/13/2016   foot   Tobacco abuse     Past Surgical History:  Procedure Laterality Date   BACK SURGERY  2022   CARDIAC CATHETERIZATION N/A 01/04/2015   Procedure: Left Heart Cath and Coronary Angiography;  Surgeon: Lonni JONETTA Cash, MD;  Location: [facility] INVASIVE CV LAB;  Service: Cardiovascular;  Laterality: N/A;   CORONARY ANGIOPLASTY WITH STENT PLACEMENT  10/21/2013   BMS to D1   CYST EXCISION N/A 05/10/2016   Procedure: EXCISION OF SEBACEOUS CYST UPPER BACK;  Surgeon: Donnice Lima, MD;  Location: [facility] OR;  Service: General;  Laterality: N/A;   FOOT SURGERY Right ~ 06/2016   said  there was skin cancer on it   LEFT HEART CATH AND CORONARY ANGIOGRAPHY N/A 01/11/2023   Procedure: LEFT HEART CATH AND CORONARY ANGIOGRAPHY;  Surgeon: Verlin Lonni BIRCH, MD;  Location: [facility] INVASIVE CV LAB;  Service: Cardiovascular;  Laterality: N/A;   LEFT HEART CATHETERIZATION WITH CORONARY ANGIOGRAM N/A 10/21/2013   Procedure: LEFT HEART CATHETERIZATION WITH CORONARY ANGIOGRAM;  Surgeon: Alm LELON Clay, MD;  Location: [facility] CATH LAB;  Service: Cardiovascular;  Laterality: N/A;   PAROTIDECTOMY Left 03/19/2023   Procedure: PAROTIDECTOMY;  Surgeon: Jesus Oliphant, MD;  Location: [facility]  OR;  Service: ENT;  Laterality: Left;   VENA CAVA FILTER PLACEMENT  ~ 2007-~ 2017   1 in my neck; 2 in my wrist    Family History  Problem Relation Age of Onset   Asthma Mother    Allergies Mother    Allergies Sister    Allergies Brother    Deep vein thrombosis Brother        two brothers with recurrent DVT   Heart attack Father        a.60s b. deceased in his 3s   Coronary artery disease Father    Colon cancer Neg Hx    Liver cancer Neg Hx    Esophageal cancer Neg Hx    Colon polyps Neg Hx    Rectal cancer Neg Hx    Pancreatic cancer Neg Hx    Stomach cancer Neg Hx     Social History   Socioeconomic History   Marital status: Single    Spouse name: Not on file   Number of children: Not on file   Years of education: Not on file   Highest education level: Not on file  Occupational History   Occupation: unemployed  Tobacco Use   Smoking status: Every Day    Current packs/day: 1.00    Average packs/day: 1 pack/day for 35.0 years (35.0 ttl pk-yrs)    Types: Cigarettes    Passive exposure: Never   Smokeless tobacco: Never   Tobacco comments:    Up to 5 cigarettes a day.   Vaping Use   Vaping status: Never Used  Substance and Sexual Activity   Alcohol use: Yes    Alcohol/week: 6.0 standard drinks of alcohol    Types: 2 Cans of beer, 4 Shots of liquor per week   Drug use: Not Currently    Types: Cocaine     Comment: 12/20/2016 might have used some the other day; I'm not sure   Sexual activity: Not Currently  Other Topics Concern   Not on file  Social History Narrative   Lives in [place].   Social Drivers of Corporate investment banker Strain: Low Risk  (06/21/2022)   Overall Financial Resource Strain (CARDIA)    Difficulty of Paying Living Expenses: Not hard at all  Food Insecurity: No Food Insecurity (03/19/2023)   Hunger Vital Sign    Worried About Running Out of Food in the Last Year: Never true    Ran Out of Food in the Last Year: Never true   Transportation Needs: Unmet Transportation Needs (08/07/2023)   PRAPARE - Administrator, Civil Service (Medical): Yes    Lack of Transportation (Non-Medical): No  Physical Activity: Inactive (06/21/2022)   Exercise Vital Sign    Days of Exercise per Week: 0 days    Minutes of Exercise per Session: 0 min  Stress: No Stress Concern Present (06/21/2022)   [employer] of Occupational Health - Occupational Stress  Questionnaire    Feeling of Stress : Not at all  Social Connections: Not on file    Allergies  Allergen Reactions   Atorvastatin      myalgia    Outpatient Medications Prior to Visit  Medication Sig Dispense Refill   albuterol  (PROAIR  HFA) 108 (90 Base) MCG/ACT inhaler Inhale 2 puffs into the lungs every 6 (six) hours as needed for wheezing or shortness of breath. 6.7 g 6   albuterol  (PROVENTIL ) (2.5 MG/3ML) 0.083% nebulizer solution Take 3 mLs (2.5 mg total) by nebulization every 6 (six) hours as needed for wheezing or shortness of breath. 90 mL 6   apixaban  (ELIQUIS ) 5 MG TABS tablet Take 1 tablet (5 mg total) by mouth 2 (two) times daily. 180 tablet 1   azelastine  (ASTELIN ) 0.1 % nasal spray Place 2 sprays into both nostrils 2 (two) times daily as needed for rhinitis. Use in each nostril as directed     Azelastine -Fluticasone  137-50 MCG/ACT SUSP Place 2 sprays into the nose every 12 (twelve) hours. 23 g 5   buPROPion  (WELLBUTRIN  XL) 150 MG 24 hr tablet Take 1 tablet (150 mg total) by mouth daily. For smoking cessation. 90 tablet 1   cetirizine  (ZYRTEC ) 10 MG tablet Take 1 tablet (10 mg total) by mouth daily. 90 tablet 1   empagliflozin  (JARDIANCE ) 10 MG TABS tablet Take 1 tablet (10 mg total) by mouth daily before breakfast. 90 tablet 3   Evolocumab  140 MG/ML SOAJ Inject 140 mg into the skin every 14 (fourteen) days. 6 mL 3   ezetimibe  (ZETIA ) 10 MG tablet Take 1 tablet (10 mg total) by mouth daily. 90 tablet 3   Fluticasone -Umeclidin-Vilant (TRELEGY ELLIPTA )  100-62.5-25 MCG/ACT AEPB Inhale 1 puff into the lungs daily. 60 each 0   furosemide  (LASIX ) 40 MG tablet Take 1 tablet (40 mg total) by mouth daily as needed (May take 40mg  Daily for Swelling and Shortness of breath). 90 tablet 1   gabapentin  (NEURONTIN ) 100 MG capsule Take 1 capsule (100 mg total) by mouth at bedtime. 90 capsule 1   HYDROcodone -acetaminophen  (NORCO) 7.5-325 MG tablet Take 1 tablet by mouth every 6 (six) hours as needed for moderate pain (pain score 4-6). 20 tablet 0   isosorbide  mononitrate (IMDUR ) 30 MG 24 hr tablet Take 1 tablet (30 mg total) by mouth daily. 90 tablet 1   losartan  (COZAAR ) 25 MG tablet Take 1 tablet (25 mg total) by mouth daily. 90 tablet 3   metoprolol  succinate (TOPROL -XL) 25 MG 24 hr tablet Take 1 tablet (25 mg total) by mouth daily. 90 tablet 1   Misc. Devices MISC Rolling walker with seat.  Diagnosis-recurrent falls 1 each 0   montelukast  (SINGULAIR ) 10 MG tablet Take 1 tablet (10 mg total) by mouth at bedtime. 90 tablet 1   nitroGLYCERIN  (NITROSTAT ) 0.4 MG SL tablet Place 1 tablet (0.4 mg total) under the tongue every 5 (five) minutes as needed for chest pain. 25 tablet 3   ondansetron  (ZOFRAN -ODT) 4 MG disintegrating tablet Take 1 tablet (4 mg total) by mouth every 8 (eight) hours as needed for nausea or vomiting. 20 tablet 0   pantoprazole  (PROTONIX ) 40 MG tablet Take 1 tablet (40 mg total) by mouth daily. 90 tablet 1   potassium chloride  SA (KLOR-CON  M) 20 MEQ tablet Take 1 tablet (20 mEq total) by mouth daily. 5 tablet 0   No facility-administered medications prior to visit.     ROS Review of Systems  Constitutional:  Positive for unexpected  weight change. Negative for activity change and appetite change.  HENT:  Negative for sinus pressure and sore throat.   Respiratory:  Negative for chest tightness, shortness of breath and wheezing.   Cardiovascular:  Negative for chest pain and palpitations.  Gastrointestinal:  Negative for abdominal  distention, abdominal pain and constipation.  Genitourinary: Negative.   Musculoskeletal: Negative.   Neurological:  Positive for weakness.  Psychiatric/Behavioral:  Negative for behavioral problems and dysphoric mood.     Objective:  BP 102/68   Pulse 78   Ht 6' 2 (1.88 m)   Wt 159 lb 12.8 oz (72.5 kg)   SpO2 98%   BMI 20.52 kg/m      09/18/2023   10:46 AM 08/07/2023   10:07 AM 07/19/2023    5:48 PM  BP/Weight  Systolic BP 102 120 144  Diastolic BP 68 80 84  Wt. (Lbs) 159.8 160.7   BMI 20.52 kg/m2 20.63 kg/m2     Wt Readings from Last 3 Encounters:  09/18/23 159 lb 12.8 oz (72.5 kg)  08/07/23 160 lb 11.2 oz (72.9 kg)  07/19/23 160 lb 0.9 oz (72.6 kg)      Physical Exam Constitutional:      Appearance: He is well-developed.   Cardiovascular:     Rate and Rhythm: Normal rate.     Heart sounds: Normal heart sounds. No murmur heard. Pulmonary:     Effort: Pulmonary effort is normal.     Breath sounds: Normal breath sounds. No wheezing or rales.  Chest:     Chest wall: No tenderness.  Abdominal:     General: Bowel sounds are normal. There is no distension.     Palpations: Abdomen is soft. There is no mass.     Tenderness: There is no abdominal tenderness.   Musculoskeletal:        General: Normal range of motion.     Right lower leg: No edema.     Left lower leg: No edema.   Neurological:     Mental Status: He is alert and oriented to person, place, and time.   Psychiatric:        Mood and Affect: Mood normal.        Latest Ref Rng & Units 08/07/2023   11:05 AM 07/19/2023   12:32 PM 05/22/2023    9:40 AM  CMP  Glucose 70 - 99 mg/dL 84  71  889   BUN 8 - 23 mg/dL 8  10  10    Creatinine 0.61 - 1.24 mg/dL 9.30  9.29  9.26   Sodium 135 - 145 mmol/L 141  141  138   Potassium 3.5 - 5.1 mmol/L 3.8  4.6  2.9   Chloride 98 - 111 mmol/L 107  104  108   CO2 22 - 32 mmol/L 26  19  22    Calcium  8.9 - 10.3 mg/dL 9.3  9.7  8.8   Total Protein 6.5 - 8.1 g/dL 8.7   7.7    Total Bilirubin 0.0 - 1.2 mg/dL 0.5  0.4    Alkaline Phos 38 - 126 U/L 104  108    AST 15 - 41 U/L 28  42    ALT 0 - 44 U/L 19  20      Lipid Panel     Component Value Date/Time   CHOL 186 07/19/2023 1232   TRIG 104 07/19/2023 1232   HDL 70 07/19/2023 1232   CHOLHDL 2.4 08/13/2020 1105   CHOLHDL 3.1 01/01/2015  0208   VLDL 12 01/01/2015 0208   LDLCALC 98 07/19/2023 1232    CBC    Component Value Date/Time   WBC 4.0 08/07/2023 1105   WBC 5.7 05/22/2023 0940   RBC 3.91 (L) 08/07/2023 1105   HGB 12.8 (L) 08/07/2023 1105   HGB 13.9 01/05/2023 1014   HCT 38.1 (L) 08/07/2023 1105   HCT 42.5 01/05/2023 1014   PLT 155 08/07/2023 1105   PLT 157 01/05/2023 1014   MCV 97.4 08/07/2023 1105   MCV 98 (H) 01/05/2023 1014   MCH 32.7 08/07/2023 1105   MCHC 33.6 08/07/2023 1105   RDW 14.9 08/07/2023 1105   RDW 13.7 01/05/2023 1014   LYMPHSABS 1.8 08/07/2023 1105   LYMPHSABS 1.8 10/05/2022 1118   MONOABS 0.4 08/07/2023 1105   EOSABS 0.3 08/07/2023 1105   EOSABS 0.5 (H) 10/05/2022 1118   BASOSABS 0.0 08/07/2023 1105   BASOSABS 0.0 10/05/2022 1118    Lab Results  Component Value Date   HGBA1C 5.1 07/19/2023       Assessment & Plan Chronic Dacryoadenitis Chronic inflammation and enlargement of the right lacrimal gland. Surgery and biopsy scheduled to assess for underlying pathology, including malignancy. Informed consent obtained. - Proceed with scheduled eye surgery and biopsy. - Ensure preoperative clearance is completed. - Follow up with oncology if biopsy results indicate abnormal findings.  Lower Extremity Weakness Unclear etiology Previously evaluated by spine surgery due to history of spinal stenosis Persistent leg weakness impeding ability to stand. Neurological evaluation pending. - Attend scheduled appointment with [facility] Neurology on July 3rd for further evaluation.  Unexplained Weight Loss Significant weight loss with no identifiable cause.  Pound  weight loss in the last 4 months recent tests, including CT scans, showed no malignancy. Elevated blood proteins suggest possible inflammation.   Polyclonal gammopathy Follow-up with oncology contingent on eye biopsy results. - Monitor weight and follow up with oncology if eye biopsy results indicate abnormalities.  Follow-up Scheduled follow-up appointments to address ongoing health concerns. - Attend vascular doctor appointment for leg evaluation. - Attend follow-up appointment with Dr. Maritza Hacker on August 7th. - Return for follow-up visit in six months or sooner if new issues arise.    No orders of the defined types were placed in this encounter.   Follow-up: Return in about 6 months (around 03/19/2024) for Chronic medical conditions.       Corrina Sabin, MD, FAAFP. [facility] [place], [place] [phone]   09/18/2023, 1:05 PM

## 2023-09-18 NOTE — Patient Instructions (Signed)
 VISIT SUMMARY:  During your visit, we discussed your upcoming eye surgery, persistent leg weakness, and recent weight loss. We reviewed your recent CT scans and blood tests, and planned follow-up appointments with specialists.  YOUR PLAN:  -CHRONIC DACRYOADENITIS: Chronic dacryoadenitis is a long-term inflammation of the tear gland in your eye. You are scheduled for surgery and a biopsy next month to investigate the cause of the inflammation. Please ensure you complete your preoperative clearance. We will follow up with oncology if the biopsy results show any abnormalities.  -LOWER EXTREMITY WEAKNESS: You have been experiencing persistent weakness in your legs, making it difficult to stand from a seated position. You have an appointment with [facility] Neurology on July 3rd for further evaluation.  -UNEXPLAINED WEIGHT LOSS: You have experienced significant weight loss without a clear cause. Recent CT scans did not show any signs of cancer, but your blood tests indicated elevated proteins, which might suggest inflammation. We will monitor your weight and follow up with oncology if your eye biopsy results indicate any abnormalities.  INSTRUCTIONS:  Please attend your scheduled appointments with [facility] Neurology on July 3rd and the vascular specialist for your leg evaluation. Also, ensure you complete your preoperative clearance for your eye surgery. Your follow-up appointment with Dr. Maritza Hacker is on August 7th. Return for a follow-up visit in six months or sooner if new issues arise.

## 2023-09-27 ENCOUNTER — Encounter: Payer: Self-pay | Admitting: Neurology

## 2023-09-27 ENCOUNTER — Ambulatory Visit (INDEPENDENT_AMBULATORY_CARE_PROVIDER_SITE_OTHER): Admitting: Neurology

## 2023-09-27 VITALS — BP 114/79 | HR 80 | Ht 74.0 in | Wt 158.2 lb

## 2023-09-27 DIAGNOSIS — R269 Unspecified abnormalities of gait and mobility: Secondary | ICD-10-CM | POA: Diagnosis not present

## 2023-09-27 DIAGNOSIS — R531 Weakness: Secondary | ICD-10-CM | POA: Diagnosis not present

## 2023-09-27 NOTE — Progress Notes (Signed)
 Chief Complaint  Patient presents with   New Patient (Initial Visit)    Rm15, alone, internal referral for Weakness of both lower extremities: pt stated that due to weakness he has issues getting up from low chairs and even the toilet      ASSESSMENT AND PLAN  [name] is a 63 y.o. male   Long history of lower  extremity weakness, slow worsening, now also complains of upper extremity weakness History of L5-S1 laminectomy and foraminotomy in August 2022 by Dr. Malcolm  Known history of cervical spondylitic disease, previous C3-4 moderate canal stenosis, variable degree of foraminal narrowing  Repeat MRI of cervical spine  MRI of thoracic spine  Lab to rule out intrinsic muscle disease.  DIAGNOSTIC DATA (LABS, IMAGING, TESTING) - I reviewed patient records, labs, notes, testing and imaging myself where available.   MEDICAL HISTORY: [name] is a 63 year old male, seen in request by his primary care physician Dr. Newlin, Enobong for evaluation of lower extremity weakness, gait abnormality, initial evaluation was on March 12, 2020.  PMHx of  Asthma DM HTN CAD Hx of cocaine  abuse COPD Smoker Hepatitis Pulmonary Embolism, DVT on Eliquis  Lumbar decompression surgery in 2022, presenting with low back pain,     He reported gradual onset bilateral lower extremity difficulty around 2010, he is a poor historian, without clear triggers, he began to notice difficulty getting up from seated position, he has to push on chair arms to get up from seated position, slow worsening of the past 10 years, he gave me an example, he was working the yard, fell to the ground, even with his friend's help, he could not get up from low seated position, he has to climb to the front porch to pull himself up, he denies significant upper extremity weakness   He complains of slow worsening low back pain,     Lab in Nov 2021, Lipid Panel, LDL 162, cholesterol 231, Methylmalonic acid level  763, BMP, Cal 8.6, Hg 10.3, ferritin 42, B12 259,  HIV - Gradual onset gait abnormality  MRI of cervical and lumbar spine January 2022: Cervical spine showed multilevel degenerative changes, most obvious at C3-4, central disc bulging, posterior ligament foramen hypertrophy, with moderate canal bilateral foraminal narrowing, MRI of lumbar spine prominent spondylitic and facet degenerative changes from L3-S1, most prominent at L5-S1, broad based disc osteophyte protrusion, and ligament flavum and hypertrophy, with mild canal, severe right side moderate left-sided foraminal narrowing,  He was seen by neurosurgeon Dr. Malcolm, failed to improve with epidural injection, had bilateral L5-S1 decompressive laminotomy and foraminotomies on November 02, 2020,  His low back pain improved some, but no improvement in his lower extremity weakness, now also concerned upper extremity weakness, denied bowel and bladder incontinence  PHYSICAL EXAM:   Vitals:   09/27/23 1000  BP: 114/79  Pulse: 80  Weight: 158 lb 3.2 oz (71.8 kg)  Height: 6' 2 (1.88 m)   Body mass index is 20.31 kg/m.  PHYSICAL EXAMNIATION:  Gen: NAD, conversant, well nourised, well groomed                     Cardiovascular: Regular rate rhythm, no peripheral edema, warm, nontender. Eyes: Conjunctivae clear without exudates or hemorrhage Neck: Supple, no carotid bruits. Pulmonary: Clear to auscultation bilaterally   NEUROLOGICAL EXAM:  MENTAL STATUS: Speech/cognition: Depressed looking middle-age male, CRANIAL NERVES: CN II: Visual fields are full to confrontation. Pupils are round equal and briskly reactive to  light. CN III, IV, VI: extraocular movement are normal.  Enlarged right lacrimal gland, CN V: Facial sensation is intact to light touch CN VII: Face is symmetric with normal eye closure  CN VIII: Hearing is normal to causal conversation. CN IX, X: Phonation is normal. CN XI: Head turning and shoulder shrug are  intact  MOTOR: No significant neck flexion muscle weakness, mild bilateral shoulder abduction external rotation, elbow flexion weakness.  Moderate bilateral hip flexion weakness, distal lower extremity muscle strength was preserved  REFLEXES: Reflexes are 2  and symmetric at the biceps, triceps, knees, and ankles. Plantar responses are extensor bilaterally  SENSORY: Intact to light touch, pinprick and vibratory sensation are intact in fingers and toes.  COORDINATION: There is no trunk or limb dysmetria noted.  GAIT/STANCE: Push-up, lordotic, wide based, cautious gait  REVIEW OF SYSTEMS:  Full 14 system review of systems performed and notable only for as above All other review of systems were negative.   ALLERGIES: Allergies  Allergen Reactions   Atorvastatin      myalgia    HOME MEDICATIONS: Current Outpatient Medications  Medication Sig Dispense Refill   albuterol  (PROAIR  HFA) 108 (90 Base) MCG/ACT inhaler Inhale 2 puffs into the lungs every 6 (six) hours as needed for wheezing or shortness of breath. 6.7 g 6   albuterol  (PROVENTIL ) (2.5 MG/3ML) 0.083% nebulizer solution Take 3 mLs (2.5 mg total) by nebulization every 6 (six) hours as needed for wheezing or shortness of breath. 90 mL 6   apixaban  (ELIQUIS ) 5 MG TABS tablet Take 1 tablet (5 mg total) by mouth 2 (two) times daily. 180 tablet 1   azelastine  (ASTELIN ) 0.1 % nasal spray Place 2 sprays into both nostrils 2 (two) times daily as needed for rhinitis. Use in each nostril as directed     Azelastine -Fluticasone  137-50 MCG/ACT SUSP Place 2 sprays into the nose every 12 (twelve) hours. 23 g 5   buPROPion  (WELLBUTRIN  XL) 150 MG 24 hr tablet Take 1 tablet (150 mg total) by mouth daily. For smoking cessation. 90 tablet 1   cetirizine  (ZYRTEC ) 10 MG tablet Take 1 tablet (10 mg total) by mouth daily. 90 tablet 1   empagliflozin  (JARDIANCE ) 10 MG TABS tablet Take 1 tablet (10 mg total) by mouth daily before breakfast. 90 tablet 3    Evolocumab  140 MG/ML SOAJ Inject 140 mg into the skin every 14 (fourteen) days. 6 mL 3   ezetimibe  (ZETIA ) 10 MG tablet Take 1 tablet (10 mg total) by mouth daily. 90 tablet 3   Fluticasone -Umeclidin-Vilant (TRELEGY ELLIPTA ) 100-62.5-25 MCG/ACT AEPB Inhale 1 puff into the lungs daily. 60 each 0   furosemide  (LASIX ) 40 MG tablet Take 1 tablet (40 mg total) by mouth daily as needed (May take 40mg  Daily for Swelling and Shortness of breath). 90 tablet 1   gabapentin  (NEURONTIN ) 100 MG capsule Take 1 capsule (100 mg total) by mouth at bedtime. 90 capsule 1   HYDROcodone -acetaminophen  (NORCO) 7.5-325 MG tablet Take 1 tablet by mouth every 6 (six) hours as needed for moderate pain (pain score 4-6). 20 tablet 0   isosorbide  mononitrate (IMDUR ) 30 MG 24 hr tablet Take 1 tablet (30 mg total) by mouth daily. 90 tablet 1   losartan  (COZAAR ) 25 MG tablet Take 1 tablet (25 mg total) by mouth daily. 90 tablet 3   metoprolol  succinate (TOPROL -XL) 25 MG 24 hr tablet Take 1 tablet (25 mg total) by mouth daily. 90 tablet 1   Misc. Devices MISC Rolling walker  with seat.  Diagnosis-recurrent falls 1 each 0   montelukast  (SINGULAIR ) 10 MG tablet Take 1 tablet (10 mg total) by mouth at bedtime. 90 tablet 1   nitroGLYCERIN  (NITROSTAT ) 0.4 MG SL tablet Place 1 tablet (0.4 mg total) under the tongue every 5 (five) minutes as needed for chest pain. 25 tablet 3   ondansetron  (ZOFRAN -ODT) 4 MG disintegrating tablet Take 1 tablet (4 mg total) by mouth every 8 (eight) hours as needed for nausea or vomiting. 20 tablet 0   pantoprazole  (PROTONIX ) 40 MG tablet Take 1 tablet (40 mg total) by mouth daily. 90 tablet 1   potassium chloride  SA (KLOR-CON  M) 20 MEQ tablet Take 1 tablet (20 mEq total) by mouth daily. 5 tablet 0   No current facility-administered medications for this visit.    PAST MEDICAL HISTORY: Past Medical History:  Diagnosis Date   Asthma    CAD (coronary artery disease)    a. 09/2013 NSTEMI/PCI: LM nl, LAD  40-50%, D1 100 (2.25 x 28 Vision BMS), LCX min irregs, RI 60-70, 30, RCA 40-50/50-32ms/p, EF 45-50%.  b. cath 12/2014 -occulded BMS in diag, 50% pro to mid LAD, 60% ramus, 30% RCA    Chest pain 08/2015   Chronic diastolic CHF (congestive heart failure) (HCC)    a. 09/2014 EF 45-50% by LV gram;  b. 01/2014 Echo: EF 55-60%, Gr 1 DD.   Cocaine  abuse (HCC)    COPD (chronic obstructive pulmonary disease) (HCC)    DVT (deep venous thrombosis) (HCC)    I had ~ 10 in each leg (12/20/2016)   Essential hypertension    Hepatitis C    clear free over a year now   High cholesterol    Myocardial infarction [facility]) ~ 2014   Osteoarthritis    a. hands and toes.   Pneumonia    several times (12/20/2016)   Pulmonary embolism (HCC)    a. 2012 - s/p IVC filter;  b. prev on eliquis  - noncompliant.   Shortness of breath dyspnea    Sinus headache    Squamous cell skin cancer 07/13/2016   foot   Tobacco abuse     PAST SURGICAL HISTORY: Past Surgical History:  Procedure Laterality Date   BACK SURGERY  2022   CARDIAC CATHETERIZATION N/A 01/04/2015   Procedure: Left Heart Cath and Coronary Angiography;  Surgeon: Lonni JONETTA Cash, MD;  [facility] INVAS[facility] CV LAB;  Service: Cardiovascular;  Laterality: N/A;   CORONARY ANGIOPLASTY WITH STENT PLACEMENT  10/21/2013   BMS to D1   CYST EXCISION N/A 05/10/2016   Procedure: EXCISION OF SEBACEOUS CYST UPPER BACK;  Surgeon: Donnice Lima, MD;  Location: [facility] OR;  Service: General;  Laterality: N/A;   FOOT SURGERY Right ~ 06/2016   said there was skin cancer on it   LEFT HEART CATH AND CORONARY ANGIOGRAPHY N/A 01/11/2023   Procedure: LEFT HEART CATH AND CORONARY ANGIOGRAPHY;  Surgeon: Cash Lonni JONETTA, MD;  Location: [facility] INVASIVE CV LAB;  Service: Cardiovascular;  Laterality: N/A;   LEFT HEART CATHETERIZATION WITH CORONARY ANGIOGRAM N/A 10/21/2013   Procedure: LEFT HEART CATHETERIZATION WITH CORONARY ANGIOGRAM;  Surgeon: Alm LELON Clay, MD;   Location[facility] CATH LAB;  Service: Cardiovascular;  Laterality: N/A;   PAROTIDECTOMY Left 03/19/2023   Procedure: PAROTIDECTOMY;  Surgeon: Jesus Oliphant, MD;  Location: [facility] OR;  Service: ENT;  Laterality: Left;   VENA CAVA FILTER PLACEMENT  ~ 2007-~ 2017   1 in my neck; 2 in my wrist  FAMILY HISTORY: Family History  Problem Relation Age of Onset   Asthma Mother    Allergies Mother    Allergies Sister    Allergies Brother    Deep vein thrombosis Brother        two brothers with recurrent DVT   Heart attack Father        a.60s b. deceased in his 59s   Coronary artery disease Father    Colon cancer Neg Hx    Liver cancer Neg Hx    Esophageal cancer Neg Hx    Colon polyps Neg Hx    Rectal cancer Neg Hx    Pancreatic cancer Neg Hx    Stomach cancer Neg Hx     SOCIAL HISTORY: Social History   Socioeconomic History   Marital status: Single    Spouse name: Not on file   Number of children: Not on file   Years of education: Not on file   Highest education level: Not on file  Occupational History   Occupation: unemployed  Tobacco Use   Smoking status: Every Day    Current packs/day: 1.00    Average packs/day: 1 pack/day for 35.0 years (35.0 ttl pk-yrs)    Types: Cigarettes    Passive exposure: Never   Smokeless tobacco: Never   Tobacco comments:    Up to 5 cigarettes a day.   Vaping Use   Vaping status: Never Used  Substance and Sexual Activity   Alcohol use: Yes    Alcohol/week: 6.0 standard drinks of alcohol    Types: 2 Cans of beer, 4 Shots of liquor per week   Drug use: Not Currently    Types: Cocaine     Comment: 12/20/2016 might have used some the other day; I'm not sure   Sexual activity: Not Currently  Other Topics Concern   Not on file  Social History Narrative   Lives in [place].   Social Drivers of Corporate investment banker Strain: Low Risk  (06/21/2022)   Overall Financial Resource Strain (CARDIA)    Difficulty of Paying Living Expenses: Not hard at  all  Food Insecurity: No Food Insecurity (03/19/2023)   Hunger Vital Sign    Worried About Running Out of Food in the Last Year: Never true    Ran Out of Food in the Last Year: Never true  Transportation Needs: Unmet Transportation Needs (08/07/2023)   PRAPARE - Administrator, Civil Service (Medical): Yes    Lack of Transportation (Non-Medical): No  Physical Activity: Inactive (06/21/2022)   Exercise Vital Sign    Days of Exercise per Week: 0 days    Minutes of Exercise per Session: 0 min  Stress: No Stress Concern Present (06/21/2022)   [employer] of Occupational Health - Occupational Stress Questionnaire    Feeling of Stress : Not at all  Social Connections: Not on file  Intimate Partner Violence: Not At Risk (03/19/2023)   Humiliation, Afraid, Rape, and Kick questionnaire    Fear of Current or Ex-Partner: No    Emotionally Abused: No    Physically Abused: No    Sexually Abused: No      Modena Callander, M.D. Ph.D.  [facility] [address] [place], [place] [address] Ph: [phone] Fax: [phone]  CC:  Delbert Clam, MD [address] [place],  [place] [address]  Delbert Clam, MD

## 2023-10-01 ENCOUNTER — Telehealth: Payer: Self-pay | Admitting: Neurology

## 2023-10-01 NOTE — Telephone Encounter (Signed)
 devoted auth: [id number] exp. 10/01/23-12/02/23 sent to GI [phone]

## 2023-10-04 ENCOUNTER — Ambulatory Visit: Payer: Self-pay | Admitting: Neurology

## 2023-10-04 LAB — IRON,TIBC AND FERRITIN PANEL
Ferritin: 42 ng/mL (ref 30–400)
Iron Saturation: 39 % (ref 15–55)
Iron: 176 ug/dL — ABNORMAL HIGH (ref 38–169)
Total Iron Binding Capacity: 456 ug/dL — ABNORMAL HIGH (ref 250–450)
UIBC: 280 ug/dL (ref 111–343)

## 2023-10-04 LAB — ACETYLCHOLINE RECEPTOR, BINDING: AChR Binding Ab, Serum: <0.07 nmol/L (ref 0.00–0.24)

## 2023-10-04 LAB — B12 AND FOLATE PANEL
Folate: 5.7 ng/mL (ref >3.0)
Vitamin B-12: 401 pg/mL (ref 232–1245)

## 2023-10-04 LAB — CK: Total CK: 134 U/L (ref 41–331)

## 2023-10-04 LAB — ANA W/REFLEX IF POSITIVE

## 2023-10-04 NOTE — Progress Notes (Signed)
 Please inform patient that recent labs are within normal limits. No evidence of Myasthenia.

## 2023-10-08 NOTE — Progress Notes (Signed)
 Will defer to Dr. Onita, patient with long history of LE weakness.

## 2023-10-11 ENCOUNTER — Encounter: Payer: Self-pay | Admitting: Physician Assistant

## 2023-10-11 DIAGNOSIS — Z79899 Other long term (current) drug therapy: Secondary | ICD-10-CM | POA: Diagnosis not present

## 2023-10-12 ENCOUNTER — Telehealth: Payer: Self-pay | Admitting: Neurology

## 2023-10-12 NOTE — Telephone Encounter (Signed)
 Please make sure he is on schedule for MRI of cervical and thoracic spine that was ordered during his most recent office visit on July 3

## 2023-10-15 ENCOUNTER — Telehealth: Payer: Self-pay | Admitting: Neurology

## 2023-10-15 NOTE — Telephone Encounter (Signed)
 Please call patient, extensive laboratory evaluation    ---Mild elevation of iron 176, total iron-binding capacity 456 of unknown clinical significance,   --- Mildly low hemoglobin 12.8  --- Rest of the laboratory evaluation showed no significant abnormalities.  may consider repeat laboratory evaluation at next scheduled follow-up with his primary care

## 2023-10-15 NOTE — Telephone Encounter (Signed)
 Call to patient and review results. He verbalized understanding and no further questions.

## 2023-10-16 ENCOUNTER — Ambulatory Visit: Payer: Self-pay

## 2023-10-16 NOTE — Telephone Encounter (Signed)
 FYI Only or Action Required?: Action required by provider: clinical question for provider and update on patient condition.  Patient was last seen in primary care on 09/18/2023 by Newlin, Enobong, MD.  Called Nurse Triage reporting Hypertension, Extremity Weakness, and Advice Only.  Symptoms began N/A.  Interventions attempted: Other: N/A.  Symptoms are: N/A.  Triage Disposition: Call PCP When Office is Open  Patient/caregiver understands and will follow disposition?: Yes                             Copied from CRM [id number]. Topic: Clinical - Red Word Triage >> Oct 16, 2023  9:29 AM Turkey B wrote: [facility] that prompted transfer to Nurse Triage: pt says was told by neuro he has high blood pressure Reason for Disposition  [1] Caller requesting NON-URGENT health information AND [2] PCP's office is the best resource  Answer Assessment - Initial Assessment Questions 1. REASON FOR CALL: What is the main reason for your call? or How can I best help you?     Patient originally called in because his neurologist advised patient to follow-up with his PCP about his blood pressure. BP from most recent neurology OV was 114/79. Patient stated there was something else he needed to follow-up with his PCP about, but he could not recall what it was. Patient stated he has been dealing with ongoing leg weakness, but his PCP was the one who referred him to neurology. Patient did not mention new or worsening symptoms. This RN attempted to ask more probing questions as to what patient needed to see provider for. Patient did not know and requested PCP to call neurologist office. Please advise.  Protocols used: Information Only Call - No Triage-A-AH

## 2023-10-16 NOTE — Telephone Encounter (Signed)
 Seen by hematology already.

## 2023-10-16 NOTE — Telephone Encounter (Signed)
 Patient was referring to the message / note from Dr. Onita, Neurology     Signed      Please call patient, extensive laboratory evaluation      ---Mild elevation of iron 176, total iron-binding capacity 456 of unknown clinical significance,    --- Mildly low hemoglobin 12.8   --- Rest of the laboratory evaluation showed no significant abnormalities.   may consider repeat laboratory evaluation at next scheduled follow-up with his primary care           Pls advise.

## 2023-10-31 ENCOUNTER — Telehealth: Payer: Self-pay | Admitting: Family Medicine

## 2023-10-31 NOTE — Telephone Encounter (Signed)
 I called patient back: [phone] regarding the need for transportation to [place].  I had to leave a message requesting a call back.     He will need to contact Medicaid for a ride and he can also check with his other insurance, [employer] to see if they provide rides to medical appointments. I can also refer him to [employer] for other possible resources.

## 2023-10-31 NOTE — Telephone Encounter (Signed)
 Copied from CRM [id number]. Topic: Clinical - Medical Advice >> Oct 31, 2023 11:47 AM Ivette P wrote: Reason for CRM: Pt called in to relay message to provider that he wont be able to make it to the eye specialist and this would be a second cancellation and needs help with transportation.  Pt also said leg doctor said nothing is wrong with his legs and would like to know his next option.  Pt would like a follow up call  [phone]- callback today or tomorrow about a ride to [place]

## 2023-10-31 NOTE — Telephone Encounter (Signed)
 fyi

## 2023-10-31 NOTE — Telephone Encounter (Signed)
 Copied from CRM #[id number]. Topic: Clinical - Medical Advice >> Oct 31, 2023  3:15 PM Antwanette L wrote: Reason for CRM: Patient is calling to let Dr. Delbert know that he saw a neurologist on 7/3 and they told him nothing was wrong with his legs. The pt is still having trouble standing up w/o assistance. The pt can be contacted at [phone].

## 2023-11-01 ENCOUNTER — Encounter: Payer: Self-pay | Admitting: Podiatry

## 2023-11-01 ENCOUNTER — Ambulatory Visit (INDEPENDENT_AMBULATORY_CARE_PROVIDER_SITE_OTHER): Admitting: Podiatry

## 2023-11-01 DIAGNOSIS — M79675 Pain in left toe(s): Secondary | ICD-10-CM | POA: Diagnosis not present

## 2023-11-01 DIAGNOSIS — B351 Tinea unguium: Secondary | ICD-10-CM | POA: Diagnosis not present

## 2023-11-01 DIAGNOSIS — M79674 Pain in right toe(s): Secondary | ICD-10-CM | POA: Diagnosis not present

## 2023-11-01 DIAGNOSIS — M2042 Other hammer toe(s) (acquired), left foot: Secondary | ICD-10-CM

## 2023-11-01 DIAGNOSIS — Z7901 Long term (current) use of anticoagulants: Secondary | ICD-10-CM | POA: Diagnosis not present

## 2023-11-01 NOTE — Progress Notes (Addendum)
 This patient returns to my office for at risk foot care.  This patient requires this care by a professional since this patient will be at risk due to having coagulation defect. Patient is taking eliquis .  The fourth toe left foot is very painful when walking.  He was treated with debridement and padding but pain quickly returned  This patient presents for evaluation and treatment.  General Appearance  Alert, conversant and in no acute stress.  Vascular  Dorsalis pedis and posterior tibial  pulses are weakly palpable  bilaterally.  Capillary return is within normal limits  bilaterally. Temperature is within normal limits  bilaterally.  Neurologic  Senn-Weinstein monofilament wire test within normal limits  bilaterally. Muscle power within normal limits bilaterally.  Nails Thick disfigured discolored nails with subungual debris  from hallux to fifth toes bilaterally. No evidence of bacterial infection or drainage bilaterally.  Orthopedic  No limitations of motion  feet .  No crepitus or effusions noted. Hammer toes 2-4  B/L.  Skin  normotropic skin with no porokeratosis noted bilaterally.  No signs of infections or ulcers noted.   Clavi 4th toe left foot.  Onychomycosis  Pain in right toes  Pain in left toes  Consent was obtained for treatment procedures.  Discussed hammer toes with this patient.  To send to Dr.  Tobie for possible flexor tenotomy or hammer toe correction surgery.   Return office visit    3    months                  Told patient to return for periodic foot care and evaluation due to potential at risk complications.   Cordella Bold DPM

## 2023-11-07 DIAGNOSIS — Z79899 Other long term (current) drug therapy: Secondary | ICD-10-CM | POA: Diagnosis not present

## 2023-11-07 NOTE — Telephone Encounter (Signed)
 I called the patient and he said that he has the number for DSS and will call on Fri, 11/09/2023 to arrange a ride to his appointment in [place] next week  He did not have any other questions.

## 2023-11-16 ENCOUNTER — Other Ambulatory Visit: Payer: Self-pay | Admitting: Family Medicine

## 2023-11-16 ENCOUNTER — Other Ambulatory Visit: Payer: Self-pay

## 2023-11-16 MED ORDER — TRELEGY ELLIPTA 100-62.5-25 MCG/ACT IN AEPB
1.0000 | INHALATION_SPRAY | Freq: Every day | RESPIRATORY_TRACT | 0 refills | Status: DC
  Filled 2023-11-16 – 2023-11-20 (×3): qty 60, 30d supply, fill #0

## 2023-11-19 DIAGNOSIS — Z79899 Other long term (current) drug therapy: Secondary | ICD-10-CM | POA: Diagnosis not present

## 2023-11-20 ENCOUNTER — Other Ambulatory Visit (HOSPITAL_COMMUNITY): Payer: Self-pay

## 2023-11-20 ENCOUNTER — Other Ambulatory Visit: Payer: Self-pay

## 2023-11-20 DIAGNOSIS — H04021 Chronic dacryoadenitis, right lacrimal gland: Secondary | ICD-10-CM | POA: Diagnosis not present

## 2023-11-20 DIAGNOSIS — Z7901 Long term (current) use of anticoagulants: Secondary | ICD-10-CM | POA: Diagnosis not present

## 2023-11-20 DIAGNOSIS — D472 Monoclonal gammopathy: Secondary | ICD-10-CM | POA: Diagnosis not present

## 2023-11-21 ENCOUNTER — Other Ambulatory Visit: Payer: Self-pay

## 2023-11-21 ENCOUNTER — Ambulatory Visit (INDEPENDENT_AMBULATORY_CARE_PROVIDER_SITE_OTHER): Admitting: Podiatry

## 2023-11-21 DIAGNOSIS — M205X2 Other deformities of toe(s) (acquired), left foot: Secondary | ICD-10-CM

## 2023-11-21 NOTE — Progress Notes (Signed)
  Subjective:  Patient ID: [name], male    DOB: 06-Jul-1960,  MRN: [id number]  Chief Complaint  Patient presents with   Toe Pain    Left 4th toe is painful with walking. Occurred after trimming the nail. Referred to Dr. Tobie by Dr. Loreda. Takes Eliquis . No visible redness.    63 y.o. male returns today for planned flexor tenotomy of the fourth surgery digit.  Objective:  There were no vitals filed for this visit.  General AA&O x3. Normal mood and affect.  Vascular Pedal pulses palpable.  Neurologic Epicritic sensation grossly intact.  Dermatologic Pre-ulcerative callus at the tip of the left, 4th toe  Orthopedic: Semi-reducible hammertoe deformity left, 4th toe    Assessment & Plan:  Patient was evaluated and treated and all questions answered.  Hammertoe left fourth with pre-ulce 6 rative callus -Flexor tenotomy as below. -Advised to remove the dressing in 24 hours and apply a band-aid and triple abx ointment every day thereafter.  Procedure: Flexor Tenotomy Indication for Procedure: toe with semi-reducible hammertoe with distal tip ulceration. Flexor tenotomy indicated to alleviate contracture, reduce pressure, and enhance healing of the ulceration. Location: left, 4th toe Anesthesia: Lidocaine  1% plain; 1.5 mL and Marcaine  0.5% plain; 1.5 mL digital block Instrumentation: 18 g needle  Technique: The toe was anesthetized as above and prepped in the usual fashion. The toe was exsanquinated and a tourniquet was secured at the base of the toe. An 18g needle was then used to percutaneously release the flexor tendon at the plantar surface of the toe with noted release of the hammertoe deformity. The incision was then dressed with antibiotic ointment and band-aid. Compression splint dressing applied. Patient tolerated the procedure well. Dressing: Dry, sterile, compression dressing. Disposition: Patient tolerated procedure well. Patient to return in 1 week for follow-up.       No follow-ups on file.

## 2023-11-28 ENCOUNTER — Other Ambulatory Visit: Payer: Self-pay

## 2023-11-28 ENCOUNTER — Other Ambulatory Visit (HOSPITAL_BASED_OUTPATIENT_CLINIC_OR_DEPARTMENT_OTHER): Payer: Self-pay | Admitting: Pharmacist

## 2023-11-28 ENCOUNTER — Other Ambulatory Visit (HOSPITAL_COMMUNITY): Payer: Self-pay

## 2023-11-28 DIAGNOSIS — Z79899 Other long term (current) drug therapy: Secondary | ICD-10-CM

## 2023-11-28 NOTE — Progress Notes (Signed)
 Pharmacy Quality Measure Review  This patient is appearing on a report for adherence measure for cholesterol (statin) and diabetes medications this calendar year.   Medication: empagliflozin  Last fill date: 08/31/2023 for 90 day supply Medication: losartan  Last fill date: 08/31/2023 for 90 day supply  Collaborated with our pharmacy. Refills being processed for today.   Herlene Fleeta Morris, PharmD, JAQUELINE, CPP Clinical Pharmacist [facility] [phone]

## 2023-12-03 DIAGNOSIS — Z79899 Other long term (current) drug therapy: Secondary | ICD-10-CM | POA: Diagnosis not present

## 2023-12-10 ENCOUNTER — Other Ambulatory Visit: Payer: Self-pay

## 2023-12-10 ENCOUNTER — Encounter: Payer: Self-pay | Admitting: Nurse Practitioner

## 2023-12-10 ENCOUNTER — Ambulatory Visit: Attending: Nurse Practitioner | Admitting: Nurse Practitioner

## 2023-12-10 VITALS — BP 109/76 | HR 77 | Ht 74.0 in | Wt 163.0 lb

## 2023-12-10 DIAGNOSIS — M5416 Radiculopathy, lumbar region: Secondary | ICD-10-CM | POA: Diagnosis not present

## 2023-12-10 DIAGNOSIS — Z23 Encounter for immunization: Secondary | ICD-10-CM

## 2023-12-10 DIAGNOSIS — R29898 Other symptoms and signs involving the musculoskeletal system: Secondary | ICD-10-CM | POA: Diagnosis not present

## 2023-12-10 MED ORDER — METHOCARBAMOL 500 MG PO TABS
500.0000 mg | ORAL_TABLET | Freq: Three times a day (TID) | ORAL | 0 refills | Status: AC | PRN
  Filled 2023-12-10: qty 60, 20d supply, fill #0

## 2023-12-10 NOTE — Patient Instructions (Signed)
 [facility] [address]  [phone]

## 2023-12-10 NOTE — Progress Notes (Unsigned)
 Assessment & Plan:  There are no diagnoses linked to this encounter.  Patient has been counseled on age-appropriate routine health concerns for screening and prevention. These are reviewed and up-to-date. Referrals have been placed accordingly. Immunizations are up-to-date or declined.    Subjective:   Chief Complaint  Patient presents with   Asthma   Leg Pain    Having pain in both legs and is wanting to try muscle relaxer and also wanting a Rolator     [name] 63 y.o. male presents to office today    Walker with a seat. Needs tall    Methocarbamol . Need muslce rleanx   ROS  Past Medical History:  Diagnosis Date   Asthma    CAD (coronary artery disease)    a. 09/2013 NSTEMI/PCI: LM nl, LAD 40-50%, D1 100 (2.25 x 28 Vision BMS), LCX min irregs, RI 60-70, 30, RCA 40-50/50-96ms/p, EF 45-50%.  b. cath 12/2014 -occulded BMS in diag, 50% pro to mid LAD, 60% ramus, 30% RCA    Chest pain 08/2015   Chronic diastolic CHF (congestive heart failure) (HCC)    a. 09/2014 EF 45-50% by LV gram;  b. 01/2014 Echo: EF 55-60%, Gr 1 DD.   Cocaine  abuse (HCC)    COPD (chronic obstructive pulmonary disease) (HCC)    DVT (deep venous thrombosis) (HCC)    I had ~ 10 in each leg (12/20/2016)   Essential hypertension    Hepatitis C    clear free over a year now   High cholesterol    Myocardial infarction [facility]) ~ 2014   Osteoarthritis    a. hands and toes.   Pneumonia    several times (12/20/2016)   Pulmonary embolism (HCC)    a. 2012 - s/p IVC filter;  b. prev on eliquis  - noncompliant.   Shortness of breath dyspnea    Sinus headache    Squamous cell skin cancer 07/13/2016   foot   Tobacco abuse     Past Surgical History:  Procedure Laterality Date   BACK SURGERY  2022   CARDIAC CATHETERIZATION N/A 01/04/2015   Procedure: Left Heart Cath and Coronary Angiography;  Surgeon: Lonni JONETTA Cash, MD;  Location: [facility] INVASIVE CV LAB;  Service: Cardiovascular;  Laterality:  N/A;   CORONARY ANGIOPLASTY WITH STENT PLACEMENT  10/21/2013   BMS to D1   CYST EXCISION N/A 05/10/2016   Procedure: EXCISION OF SEBACEOUS CYST UPPER BACK;  Surgeon: Donnice Lima, MD;  Location: [facility] OR;  Service: General;  Laterality: N/A;   FOOT SURGERY Right ~ 06/2016   said there was skin cancer on it   LEFT HEART CATH AND CORONARY ANGIOGRAPHY N/A 01/11/2023   Procedure: LEFT HEART CATH AND CORONARY ANGIOGRAPHY;  Surgeon: Cash Lonni JONETTA, MD;  Location: [facility] INVASIVE CV LAB;  Service: Cardiovascular;  Laterality: N/A;   LEFT HEART CATHETERIZATION WITH CORONARY ANGIOGRAM N/A 10/21/2013   Procedure: LEFT HEART CATHETERIZATION WITH CORONARY ANGIOGRAM;  Surgeon: Alm LELON Clay, MD;  Location: [facility] CATH LAB;  Service: Cardiovascular;  Laterality: N/A;   PAROTIDECTOMY Left 03/19/2023   Procedure: PAROTIDECTOMY;  Surgeon: Jesus Oliphant, MD;  Location: [facility] OR;  Service: ENT;  Laterality: Left;   VENA CAVA FILTER PLACEMENT  ~ 2007-~ 2017   1 in my neck; 2 in my wrist    Family History  Problem Relation Age of Onset   Asthma Mother    Allergies Mother    Allergies Sister    Allergies Brother    Deep  vein thrombosis Brother        two brothers with recurrent DVT   Heart attack Father        a.60s b. deceased in his 32s   Coronary artery disease Father    Colon cancer Neg Hx    Liver cancer Neg Hx    Esophageal cancer Neg Hx    Colon polyps Neg Hx    Rectal cancer Neg Hx    Pancreatic cancer Neg Hx    Stomach cancer Neg Hx     Social History Reviewed with no changes to be made today.   Outpatient Medications Prior to Visit  Medication Sig Dispense Refill   albuterol  (PROAIR  HFA) 108 (90 Base) MCG/ACT inhaler Inhale 2 puffs into the lungs every 6 (six) hours as needed for wheezing or shortness of breath. 6.7 g 6   albuterol  (PROVENTIL ) (2.5 MG/3ML) 0.083% nebulizer solution Take 3 mLs (2.5 mg total) by nebulization every 6 (six) hours as needed for wheezing or shortness of  breath. 90 mL 6   apixaban  (ELIQUIS ) 5 MG TABS tablet Take 1 tablet (5 mg total) by mouth 2 (two) times daily. 180 tablet 1   azelastine  (ASTELIN ) 0.1 % nasal spray Place 2 sprays into both nostrils 2 (two) times daily as needed for rhinitis. Use in each nostril as directed     Azelastine -Fluticasone  137-50 MCG/ACT SUSP Place 2 sprays into the nose every 12 (twelve) hours. 23 g 5   buPROPion  (WELLBUTRIN  XL) 150 MG 24 hr tablet Take 1 tablet (150 mg total) by mouth daily. For smoking cessation. 90 tablet 1   cetirizine  (ZYRTEC ) 10 MG tablet Take 1 tablet (10 mg total) by mouth daily. 90 tablet 1   empagliflozin  (JARDIANCE ) 10 MG TABS tablet Take 1 tablet (10 mg total) by mouth daily before breakfast. 90 tablet 3   Evolocumab  140 MG/ML SOAJ Inject 140 mg into the skin every 14 (fourteen) days. 6 mL 3   ezetimibe  (ZETIA ) 10 MG tablet Take 1 tablet (10 mg total) by mouth daily. 90 tablet 3   Fluticasone -Umeclidin-Vilant (TRELEGY ELLIPTA ) 100-62.5-25 MCG/ACT AEPB Inhale 1 puff into the lungs daily. 60 each 0   furosemide  (LASIX ) 40 MG tablet Take 1 tablet (40 mg total) by mouth daily as needed (May take 40mg  Daily for Swelling and Shortness of breath). 90 tablet 1   gabapentin  (NEURONTIN ) 100 MG capsule Take 1 capsule (100 mg total) by mouth at bedtime. 90 capsule 1   isosorbide  mononitrate (IMDUR ) 30 MG 24 hr tablet Take 1 tablet (30 mg total) by mouth daily. 90 tablet 1   losartan  (COZAAR ) 25 MG tablet Take 1 tablet (25 mg total) by mouth daily. 90 tablet 3   metoprolol  succinate (TOPROL -XL) 25 MG 24 hr tablet Take 1 tablet (25 mg total) by mouth daily. 90 tablet 1   Misc. Devices MISC Rolling walker with seat.  Diagnosis-recurrent falls 1 each 0   montelukast  (SINGULAIR ) 10 MG tablet Take 1 tablet (10 mg total) by mouth at bedtime. 90 tablet 1   nitroGLYCERIN  (NITROSTAT ) 0.4 MG SL tablet Place 1 tablet (0.4 mg total) under the tongue every 5 (five) minutes as needed for chest pain. 25 tablet 3    ondansetron  (ZOFRAN -ODT) 4 MG disintegrating tablet Take 1 tablet (4 mg total) by mouth every 8 (eight) hours as needed for nausea or vomiting. 20 tablet 0   pantoprazole  (PROTONIX ) 40 MG tablet Take 1 tablet (40 mg total) by mouth daily. 90 tablet 1  potassium chloride  SA (KLOR-CON  M) 20 MEQ tablet Take 1 tablet (20 mEq total) by mouth daily. 5 tablet 0   No facility-administered medications prior to visit.    Allergies  Allergen Reactions   Atorvastatin      myalgia       Objective:    BP 109/76 (BP Location: Right Arm, Patient Position: Sitting, Cuff Size: Normal)   Pulse 77   Wt 163 lb (73.9 kg)   SpO2 97%   BMI 20.93 kg/m  Wt Readings from Last 3 Encounters:  12/10/23 163 lb (73.9 kg)  09/27/23 158 lb 3.2 oz (71.8 kg)  09/18/23 159 lb 12.8 oz (72.5 kg)    Physical Exam       Patient has been counseled extensively about nutrition and exercise as well as the importance of adherence with medications and regular follow-up. The patient was given clear instructions to go to ER or return to medical center if symptoms don't improve, worsen or new problems develop. The patient verbalized understanding.   Follow-up: No follow-ups on file.   Haze LELON Servant, FNP-BC [facility] [place], [place] [phone]   12/10/2023, 3:32 PM

## 2023-12-11 ENCOUNTER — Encounter: Payer: Self-pay | Admitting: Nurse Practitioner

## 2023-12-11 ENCOUNTER — Other Ambulatory Visit: Payer: Self-pay

## 2023-12-12 ENCOUNTER — Encounter: Payer: Self-pay | Admitting: Pharmacist

## 2023-12-12 ENCOUNTER — Other Ambulatory Visit (HOSPITAL_BASED_OUTPATIENT_CLINIC_OR_DEPARTMENT_OTHER): Payer: Self-pay | Admitting: Pharmacist

## 2023-12-12 DIAGNOSIS — Z79899 Other long term (current) drug therapy: Secondary | ICD-10-CM

## 2023-12-12 NOTE — Progress Notes (Signed)
 Pharmacy Quality Measure Review  This patient is appearing on a report for adherence measure for HTN and DM medications this calendar year.   Medication: empagliflozin  Last fill date: 11/29/23 for 90 day supply  Medication: losartan  Last fill date: 11/29/23 for 90 day supply  Refills are up to date. Next fill date is 02/27/24  Herlene Fleeta Morris, PharmD, JAQUELINE, CPP Clinical Pharmacist [facility] [phone]

## 2023-12-19 ENCOUNTER — Encounter: Payer: Self-pay | Admitting: Neurology

## 2023-12-25 ENCOUNTER — Inpatient Hospital Stay: Admission: RE | Admit: 2023-12-25 | Source: Ambulatory Visit

## 2023-12-25 ENCOUNTER — Other Ambulatory Visit

## 2023-12-25 ENCOUNTER — Other Ambulatory Visit: Payer: Self-pay | Admitting: Family Medicine

## 2023-12-25 NOTE — Telephone Encounter (Unsigned)
 Copied from CRM [id number]. Topic: Clinical - Medication Refill >> Dec 25, 2023 11:50 AM Joesph NOVAK wrote: Medication:  Fluticasone -Umeclidin-Vilant (TRELEGY ELLIPTA ) 100-62.5-25 MCG/ACT AEPB  Has the patient contacted their pharmacy? Yes (Agent: If no, request that the patient contact the pharmacy for the refill. If patient does not wish to contact the pharmacy document the reason why and proceed with request.) (Agent: If yes, when and what did the pharmacy advise?)  This is the patient's preferred pharmacy:    [facility] [address] [place] [address] Phone: [phone] Fax: [phone]  Is this the correct pharmacy for this prescription? Yes If no, delete pharmacy and type the correct one.   Has the prescription been filled recently? Yes  Is the patient out of the medication? Yes  Has the patient been seen for an appointment in the last year OR does the patient have an upcoming appointment? Yes  Can we respond through MyChart? Yes  Agent: Please be advised that Rx refills may take up to 3 business days. We ask that you follow-up with your pharmacy.

## 2023-12-26 ENCOUNTER — Ambulatory Visit (INDEPENDENT_AMBULATORY_CARE_PROVIDER_SITE_OTHER)

## 2023-12-26 ENCOUNTER — Ambulatory Visit: Admitting: Podiatry

## 2023-12-26 DIAGNOSIS — Z01818 Encounter for other preprocedural examination: Secondary | ICD-10-CM

## 2023-12-26 DIAGNOSIS — M2042 Other hammer toe(s) (acquired), left foot: Secondary | ICD-10-CM | POA: Diagnosis not present

## 2023-12-26 NOTE — Progress Notes (Signed)
 Subjective:  Patient ID: [name], male    DOB: January 02, 1961,  MRN: [id number]  Chief Complaint  Patient presents with   Toe Pain    Left foot 4th toe pain     63 y.o. male presents with the above complaint.  Patient presents with left fourth digit toe contracture/hammertoe deformity.  He states that the tenotomy lasted for a little while but is starting to crawl back up again.  He would like to discuss more aggressive treatment options.  Pain scale 7 out of 10 dull aching nature.  He would like to discuss more treatment options.  He has tried shoe gear modification padding protecting offloading none of which has helped.  He wishes surgical options at this time pain scale 7 out of 10   Review of Systems: Negative except as noted in the HPI. Denies N/V/F/Ch.  Past Medical History:  Diagnosis Date   Asthma    CAD (coronary artery disease)    a. 09/2013 NSTEMI/PCI: LM nl, LAD 40-50%, D1 100 (2.25 x 28 Vision BMS), LCX min irregs, RI 60-70, 30, RCA 40-50/50-9ms/p, EF 45-50%.  b. cath 12/2014 -occulded BMS in diag, 50% pro to mid LAD, 60% ramus, 30% RCA    Chest pain 08/2015   Chronic diastolic CHF (congestive heart failure) (HCC)    a. 09/2014 EF 45-50% by LV gram;  b. 01/2014 Echo: EF 55-60%, Gr 1 DD.   Cocaine  abuse (HCC)    COPD (chronic obstructive pulmonary disease) (HCC)    DVT (deep venous thrombosis) (HCC)    I had ~ 10 in each leg (12/20/2016)   Essential hypertension    Hepatitis C    clear free over a year now   High cholesterol    Myocardial infarction [facility]) ~ 2014   Osteoarthritis    a. hands and toes.   Pneumonia    several times (12/20/2016)   Pulmonary embolism (HCC)    a. 2012 - s/p IVC filter;  b. prev on eliquis  - noncompliant.   Shortness of breath dyspnea    Sinus headache    Squamous cell skin cancer 07/13/2016   foot   Tobacco abuse     Current Outpatient Medications:    albuterol  (PROAIR  HFA) 108 (90 Base) MCG/ACT inhaler, Inhale 2 puffs  into the lungs every 6 (six) hours as needed for wheezing or shortness of breath., Disp: 6.7 g, Rfl: 6   albuterol  (PROVENTIL ) (2.5 MG/3ML) 0.083% nebulizer solution, Take 3 mLs (2.5 mg total) by nebulization every 6 (six) hours as needed for wheezing or shortness of breath., Disp: 90 mL, Rfl: 6   apixaban  (ELIQUIS ) 5 MG TABS tablet, Take 1 tablet (5 mg total) by mouth 2 (two) times daily., Disp: 180 tablet, Rfl: 1   azelastine  (ASTELIN ) 0.1 % nasal spray, Place 2 sprays into both nostrils 2 (two) times daily as needed for rhinitis. Use in each nostril as directed, Disp: , Rfl:    Azelastine -Fluticasone  137-50 MCG/ACT SUSP, Place 2 sprays into the nose every 12 (twelve) hours., Disp: 23 g, Rfl: 5   buPROPion  (WELLBUTRIN  XL) 150 MG 24 hr tablet, Take 1 tablet (150 mg total) by mouth daily. For smoking cessation., Disp: 90 tablet, Rfl: 1   cetirizine  (ZYRTEC ) 10 MG tablet, Take 1 tablet (10 mg total) by mouth daily., Disp: 90 tablet, Rfl: 1   empagliflozin  (JARDIANCE ) 10 MG TABS tablet, Take 1 tablet (10 mg total) by mouth daily before breakfast., Disp: 90 tablet, Rfl: 3  Evolocumab  140 MG/ML SOAJ, Inject 140 mg into the skin every 14 (fourteen) days., Disp: 6 mL, Rfl: 3   ezetimibe  (ZETIA ) 10 MG tablet, Take 1 tablet (10 mg total) by mouth daily., Disp: 90 tablet, Rfl: 3   Fluticasone -Umeclidin-Vilant (TRELEGY ELLIPTA ) 100-62.5-25 MCG/ACT AEPB, Inhale 1 puff into the lungs daily., Disp: 60 each, Rfl: 6   furosemide  (LASIX ) 40 MG tablet, Take 1 tablet (40 mg total) by mouth daily as needed (May take 40mg  Daily for Swelling and Shortness of breath)., Disp: 90 tablet, Rfl: 1   gabapentin  (NEURONTIN ) 100 MG capsule, Take 1 capsule (100 mg total) by mouth at bedtime., Disp: 90 capsule, Rfl: 1   isosorbide  mononitrate (IMDUR ) 30 MG 24 hr tablet, Take 1 tablet (30 mg total) by mouth daily., Disp: 90 tablet, Rfl: 1   losartan  (COZAAR ) 25 MG tablet, Take 1 tablet (25 mg total) by mouth daily., Disp: 90 tablet,  Rfl: 3   methocarbamol  (ROBAXIN ) 500 MG tablet, Take 1 tablet (500 mg total) by mouth every 8 (eight) hours as needed for muscle spasms. NEEDS MAILED, Disp: 60 tablet, Rfl: 0   metoprolol  succinate (TOPROL -XL) 25 MG 24 hr tablet, Take 1 tablet (25 mg total) by mouth daily., Disp: 90 tablet, Rfl: 1   Misc. Devices MISC, Rolling walker with seat.  Diagnosis-recurrent falls, Disp: 1 each, Rfl: 0   montelukast  (SINGULAIR ) 10 MG tablet, Take 1 tablet (10 mg total) by mouth at bedtime., Disp: 90 tablet, Rfl: 1   nitroGLYCERIN  (NITROSTAT ) 0.4 MG SL tablet, Place 1 tablet (0.4 mg total) under the tongue every 5 (five) minutes as needed for chest pain., Disp: 25 tablet, Rfl: 3   ondansetron  (ZOFRAN -ODT) 4 MG disintegrating tablet, Take 1 tablet (4 mg total) by mouth every 8 (eight) hours as needed for nausea or vomiting., Disp: 20 tablet, Rfl: 0   pantoprazole  (PROTONIX ) 40 MG tablet, Take 1 tablet (40 mg total) by mouth daily., Disp: 90 tablet, Rfl: 1   potassium chloride  SA (KLOR-CON  M) 20 MEQ tablet, Take 1 tablet (20 mEq total) by mouth daily., Disp: 5 tablet, Rfl: 0  Social History   Tobacco Use  Smoking Status Every Day   Current packs/day: 1.00   Average packs/day: 1 pack/day for 35.0 years (35.0 ttl pk-yrs)   Types: Cigarettes   Passive exposure: Never  Smokeless Tobacco Never  Tobacco Comments   Up to 5 cigarettes a day.     Allergies  Allergen Reactions   Atorvastatin      myalgia   Objective:  There were no vitals filed for this visit. There is no height or weight on file to calculate BMI. Constitutional Well developed. Well nourished.  Vascular Dorsalis pedis pulses palpable bilaterally. Posterior tibial pulses palpable bilaterally. Capillary refill normal to all digits.  No cyanosis or clubbing noted. Pedal hair growth normal.  Neurologic Normal speech. Oriented to person, place, and time. Epicritic sensation to light touch grossly present bilaterally.  Dermatologic Nails  well groomed and normal in appearance. No open wounds. No skin lesions.  Orthopedic: Left fourth digit hammertoe contracture noted semiflexible in nature.  Pain on palpation to the fourth toe.   Radiographs: 3 views of skeletally mature adult left fourth digit: Hammertoe contracture of fourth digit noted.  No other abnormalities identified.  Midfoot arthritis noted.  Pes cavus foot structure noted.  First MTP arthritis noted. Assessment:   1. Hammer toe of left foot   2. Encounter for preoperative examination for general surgical procedure  Plan:  Patient was evaluated and treated and all questions answered.  Left fourth digit hammertoe contracture - All questions and concerns were discussed with the patient in extensive detail given the amount of pain that he is experiencing he will benefit from left fourth digit hammertoe arthroplasty.  I discussed this procedure with the patient in extensive detail he states understand like to proceed with surgery I discussed my preoperative or postop plan with the patient in extensive detail.  He states understanding would like to proceed with surgery -Informed surgical risk consent was reviewed and read aloud to the patient.  I reviewed the films.  I have discussed my findings with the patient in great detail.  I have discussed all risks including but not limited to infection, stiffness, scarring, limp, disability, deformity, damage to blood vessels and nerves, numbness, poor healing, need for braces, arthritis, chronic pain, amputation, death.  All benefits and realistic expectations discussed in great detail.  I have made no promises as to the outcome.  I have provided realistic expectations.  I have offered the patient a 2nd opinion, which they have declined and assured me they preferred to proceed despite the risks   No follow-ups on file.

## 2023-12-27 ENCOUNTER — Other Ambulatory Visit (HOSPITAL_COMMUNITY): Payer: Self-pay

## 2023-12-27 MED ORDER — TRELEGY ELLIPTA 100-62.5-25 MCG/ACT IN AEPB
1.0000 | INHALATION_SPRAY | Freq: Every day | RESPIRATORY_TRACT | 6 refills | Status: DC
  Filled 2023-12-27 – 2024-01-21 (×2): qty 60, 30d supply, fill #0

## 2023-12-27 NOTE — Telephone Encounter (Signed)
 Requested medication (s) are due for refill today: yes  Requested medication (s) are on the active medication list: yes  Last refill:  11/16/23 60 each  Future visit scheduled: yes  Notes to clinic:  med not assigned to a protocol   Requested Prescriptions  Pending Prescriptions Disp Refills   Fluticasone -Umeclidin-Vilant (TRELEGY ELLIPTA ) 100-62.5-25 MCG/ACT AEPB 60 each 0    Sig: Inhale 1 puff into the lungs daily.     Off-Protocol Failed - 12/27/2023 10:21 AM      Failed - Medication not assigned to a protocol, review manually.      Passed - Valid encounter within last 12 months    Recent Outpatient Visits           2 weeks ago Lumbar radiculopathy   [place] [place] [facility] [place] Theotis Haze ORN, NP   3 months ago Dacryoadenitis of right lacrimal gland   [place] [facility] [place] [facility] [place] Delbert Clam, MD   5 months ago Weight loss   [facility] [place] [facility] [place]. [facility] Delbert Clam, MD   8 months ago Eye mass   [place] [facility] [place]. [facility] Delbert Clam, MD   1 year ago Parotid tumor    [facility] [place] [facility] [place]. [facility] Burley, [place], PA-C

## 2023-12-28 ENCOUNTER — Telehealth: Payer: Self-pay

## 2023-12-28 NOTE — Telephone Encounter (Signed)
Noted.  Will follow up with patient.

## 2023-12-28 NOTE — Telephone Encounter (Signed)
 Copied from CRM [id number]. Topic: Clinical - Order For Equipment >> Dec 28, 2023 11:05 AM Dedra B wrote: Reason for CRM: Pt called to follow up on his request for a walker. Pls call pt.

## 2024-01-04 ENCOUNTER — Telehealth: Payer: Self-pay | Admitting: Podiatry

## 2024-01-04 NOTE — Telephone Encounter (Signed)
 Attempted to call patient in regards to scheduling surgery but received a call can not be completed as dialed, please consult your directory message. Need updated telephone number.

## 2024-01-08 ENCOUNTER — Other Ambulatory Visit (HOSPITAL_COMMUNITY): Payer: Self-pay

## 2024-01-09 ENCOUNTER — Ambulatory Visit
Admission: RE | Admit: 2024-01-09 | Discharge: 2024-01-09 | Disposition: A | Discharge status: Home / Self Care | Source: Ambulatory Visit | Attending: Neurology | Admitting: Neurology

## 2024-01-09 DIAGNOSIS — R269 Unspecified abnormalities of gait and mobility: Secondary | ICD-10-CM

## 2024-01-09 DIAGNOSIS — R531 Weakness: Secondary | ICD-10-CM

## 2024-01-18 ENCOUNTER — Inpatient Hospital Stay (HOSPITAL_COMMUNITY)
Admission: EM | Admit: 2024-01-18 | Discharge: 2024-01-22 | DRG: 190 | Disposition: A | Discharge status: Home Health | Attending: Internal Medicine | Admitting: Internal Medicine

## 2024-01-18 ENCOUNTER — Encounter (HOSPITAL_COMMUNITY): Payer: Self-pay

## 2024-01-18 ENCOUNTER — Emergency Department (HOSPITAL_COMMUNITY)

## 2024-01-18 ENCOUNTER — Other Ambulatory Visit: Payer: Self-pay

## 2024-01-18 DIAGNOSIS — F1410 Cocaine abuse, uncomplicated: Secondary | ICD-10-CM | POA: Diagnosis present

## 2024-01-18 DIAGNOSIS — I110 Hypertensive heart disease with heart failure: Secondary | ICD-10-CM | POA: Diagnosis present

## 2024-01-18 DIAGNOSIS — R531 Weakness: Secondary | ICD-10-CM | POA: Diagnosis present

## 2024-01-18 DIAGNOSIS — R634 Abnormal weight loss: Secondary | ICD-10-CM | POA: Diagnosis present

## 2024-01-18 DIAGNOSIS — Z7984 Long term (current) use of oral hypoglycemic drugs: Secondary | ICD-10-CM

## 2024-01-18 DIAGNOSIS — I5042 Chronic combined systolic (congestive) and diastolic (congestive) heart failure: Secondary | ICD-10-CM | POA: Diagnosis present

## 2024-01-18 DIAGNOSIS — R296 Repeated falls: Secondary | ICD-10-CM | POA: Diagnosis present

## 2024-01-18 DIAGNOSIS — Z86711 Personal history of pulmonary embolism: Secondary | ICD-10-CM

## 2024-01-18 DIAGNOSIS — Z825 Family history of asthma and other chronic lower respiratory diseases: Secondary | ICD-10-CM

## 2024-01-18 DIAGNOSIS — E876 Hypokalemia: Secondary | ICD-10-CM | POA: Diagnosis present

## 2024-01-18 DIAGNOSIS — E7800 Pure hypercholesterolemia, unspecified: Secondary | ICD-10-CM | POA: Diagnosis present

## 2024-01-18 DIAGNOSIS — Z560 Unemployment, unspecified: Secondary | ICD-10-CM | POA: Diagnosis not present

## 2024-01-18 DIAGNOSIS — Z955 Presence of coronary angioplasty implant and graft: Secondary | ICD-10-CM | POA: Diagnosis not present

## 2024-01-18 DIAGNOSIS — Z5982 Transportation insecurity: Secondary | ICD-10-CM

## 2024-01-18 DIAGNOSIS — J45901 Unspecified asthma with (acute) exacerbation: Secondary | ICD-10-CM | POA: Diagnosis present

## 2024-01-18 DIAGNOSIS — Z85828 Personal history of other malignant neoplasm of skin: Secondary | ICD-10-CM | POA: Diagnosis not present

## 2024-01-18 DIAGNOSIS — I2489 Other forms of acute ischemic heart disease: Secondary | ICD-10-CM | POA: Diagnosis present

## 2024-01-18 DIAGNOSIS — J9601 Acute respiratory failure with hypoxia: Secondary | ICD-10-CM | POA: Diagnosis present

## 2024-01-18 DIAGNOSIS — Z7901 Long term (current) use of anticoagulants: Secondary | ICD-10-CM | POA: Diagnosis not present

## 2024-01-18 DIAGNOSIS — Z5941 Food insecurity: Secondary | ICD-10-CM

## 2024-01-18 DIAGNOSIS — I252 Old myocardial infarction: Secondary | ICD-10-CM

## 2024-01-18 DIAGNOSIS — I493 Ventricular premature depolarization: Secondary | ICD-10-CM | POA: Diagnosis present

## 2024-01-18 DIAGNOSIS — Z86718 Personal history of other venous thrombosis and embolism: Secondary | ICD-10-CM | POA: Diagnosis not present

## 2024-01-18 DIAGNOSIS — F17210 Nicotine dependence, cigarettes, uncomplicated: Secondary | ICD-10-CM | POA: Diagnosis present

## 2024-01-18 DIAGNOSIS — Z8619 Personal history of other infectious and parasitic diseases: Secondary | ICD-10-CM

## 2024-01-18 DIAGNOSIS — I10 Essential (primary) hypertension: Secondary | ICD-10-CM | POA: Diagnosis not present

## 2024-01-18 DIAGNOSIS — J441 Chronic obstructive pulmonary disease with (acute) exacerbation: Secondary | ICD-10-CM | POA: Diagnosis present

## 2024-01-18 DIAGNOSIS — R7989 Other specified abnormal findings of blood chemistry: Secondary | ICD-10-CM | POA: Diagnosis not present

## 2024-01-18 DIAGNOSIS — R9431 Abnormal electrocardiogram [ECG] [EKG]: Secondary | ICD-10-CM | POA: Diagnosis not present

## 2024-01-18 DIAGNOSIS — Z720 Tobacco use: Secondary | ICD-10-CM | POA: Diagnosis present

## 2024-01-18 DIAGNOSIS — Z6821 Body mass index (BMI) 21.0-21.9, adult: Secondary | ICD-10-CM

## 2024-01-18 DIAGNOSIS — Z95828 Presence of other vascular implants and grafts: Secondary | ICD-10-CM

## 2024-01-18 DIAGNOSIS — Z8249 Family history of ischemic heart disease and other diseases of the circulatory system: Secondary | ICD-10-CM | POA: Diagnosis not present

## 2024-01-18 DIAGNOSIS — I2510 Atherosclerotic heart disease of native coronary artery without angina pectoris: Secondary | ICD-10-CM | POA: Diagnosis present

## 2024-01-18 DIAGNOSIS — E875 Hyperkalemia: Secondary | ICD-10-CM | POA: Diagnosis present

## 2024-01-18 DIAGNOSIS — R000 Tachycardia, unspecified: Secondary | ICD-10-CM | POA: Diagnosis present

## 2024-01-18 DIAGNOSIS — Z888 Allergy status to other drugs, medicaments and biological substances status: Secondary | ICD-10-CM

## 2024-01-18 DIAGNOSIS — Z5948 Other specified lack of adequate food: Secondary | ICD-10-CM

## 2024-01-18 DIAGNOSIS — Z79899 Other long term (current) drug therapy: Secondary | ICD-10-CM

## 2024-01-18 DIAGNOSIS — I5022 Chronic systolic (congestive) heart failure: Secondary | ICD-10-CM | POA: Diagnosis not present

## 2024-01-18 DIAGNOSIS — J449 Chronic obstructive pulmonary disease, unspecified: Secondary | ICD-10-CM

## 2024-01-18 DIAGNOSIS — I249 Acute ischemic heart disease, unspecified: Secondary | ICD-10-CM | POA: Diagnosis not present

## 2024-01-18 HISTORY — DX: Chronic diastolic (congestive) heart failure: I50.32

## 2024-01-18 LAB — CBC WITH DIFFERENTIAL/PLATELET
Abs Immature Granulocytes: 0.02 K/uL (ref 0.00–0.07)
Basophils Absolute: 0 K/uL (ref 0.0–0.1)
Basophils Relative: 1 %
Eosinophils Absolute: 0.7 K/uL — ABNORMAL HIGH (ref 0.0–0.5)
Eosinophils Relative: 14 %
HCT: 44 % (ref 39.0–52.0)
Hemoglobin: 14.1 g/dL (ref 13.0–17.0)
Immature Granulocytes: 0 %
Lymphocytes Relative: 30 %
Lymphs Abs: 1.6 K/uL (ref 0.7–4.0)
MCH: 32.4 pg (ref 26.0–34.0)
MCHC: 32 g/dL (ref 30.0–36.0)
MCV: 101.1 fL — ABNORMAL HIGH (ref 80.0–100.0)
Monocytes Absolute: 0.6 K/uL (ref 0.1–1.0)
Monocytes Relative: 11 %
Neutro Abs: 2.4 K/uL (ref 1.7–7.7)
Neutrophils Relative %: 44 %
Platelets: 141 K/uL — ABNORMAL LOW (ref 150–400)
RBC: 4.35 MIL/uL (ref 4.22–5.81)
RDW: 14.6 % (ref 11.5–15.5)
WBC: 5.3 K/uL (ref 4.0–10.5)
nRBC: 0 % (ref 0.0–0.2)

## 2024-01-18 LAB — I-STAT ARTERIAL BLOOD GAS, ED
Acid-base deficit: 4 mmol/L — ABNORMAL HIGH (ref 0.0–2.0)
Bicarbonate: 20.8 mmol/L (ref 20.0–28.0)
Calcium, Ion: 1.29 mmol/L (ref 1.15–1.40)
HCT: 43 % (ref 39.0–52.0)
Hemoglobin: 14.6 g/dL (ref 13.0–17.0)
O2 Saturation: 100 %
Patient temperature: 97.7
Potassium: 3.7 mmol/L (ref 3.5–5.1)
Sodium: 140 mmol/L (ref 135–145)
TCO2: 22 mmol/L (ref 22–32)
pCO2 arterial: 36.8 mmHg (ref 32–48)
pH, Arterial: 7.358 (ref 7.35–7.45)
pO2, Arterial: 246 mmHg — ABNORMAL HIGH (ref 83–108)

## 2024-01-18 LAB — TROPONIN I (HIGH SENSITIVITY)
Troponin I (High Sensitivity): 11 ng/L (ref <18)
Troponin I (High Sensitivity): 34 ng/L — ABNORMAL HIGH (ref <18)
Troponin I (High Sensitivity): 83 ng/L — ABNORMAL HIGH (ref <18)
Troponin I (High Sensitivity): 200 ng/L (ref <18)
Troponin I (High Sensitivity): 262 ng/L (ref <18)

## 2024-01-18 LAB — COMPREHENSIVE METABOLIC PANEL WITH GFR
ALT: 13 U/L (ref 0–44)
AST: 18 U/L (ref 15–41)
Albumin: <1.5 g/dL — ABNORMAL LOW (ref 3.5–5.0)
Alkaline Phosphatase: 41 U/L (ref 38–126)
Anion gap: 8 (ref 5–15)
BUN: <5 mg/dL — ABNORMAL LOW (ref 8–23)
CO2: 13 mmol/L — ABNORMAL LOW (ref 22–32)
Calcium: 5.8 mg/dL — CL (ref 8.9–10.3)
Chloride: 113 mmol/L — ABNORMAL HIGH (ref 98–111)
Creatinine, Ser: 0.42 mg/dL — ABNORMAL LOW (ref 0.61–1.24)
GFR, Estimated: >60 mL/min (ref >60)
Glucose, Bld: 103 mg/dL — ABNORMAL HIGH (ref 70–99)
Potassium: 2 mmol/L — CL (ref 3.5–5.1)
Sodium: 134 mmol/L — ABNORMAL LOW (ref 135–145)
Total Bilirubin: 0.5 mg/dL (ref 0.0–1.2)
Total Protein: 4.2 g/dL — ABNORMAL LOW (ref 6.5–8.1)

## 2024-01-18 LAB — I-STAT CHEM 8, ED
BUN: 10 mg/dL (ref 8–23)
Calcium, Ion: 1.19 mmol/L (ref 1.15–1.40)
Chloride: 108 mmol/L (ref 98–111)
Creatinine, Ser: 0.7 mg/dL (ref 0.61–1.24)
Glucose, Bld: 150 mg/dL — ABNORMAL HIGH (ref 70–99)
HCT: 46 % (ref 39.0–52.0)
Hemoglobin: 15.6 g/dL (ref 13.0–17.0)
Potassium: 4.1 mmol/L (ref 3.5–5.1)
Sodium: 141 mmol/L (ref 135–145)
TCO2: 22 mmol/L (ref 22–32)

## 2024-01-18 LAB — BRAIN NATRIURETIC PEPTIDE: B Natriuretic Peptide: 45.8 pg/mL (ref 0.0–100.0)

## 2024-01-18 LAB — MAGNESIUM: Magnesium: 2.4 mg/dL (ref 1.7–2.4)

## 2024-01-18 LAB — HIV ANTIBODY (ROUTINE TESTING W REFLEX): HIV Screen 4th Generation wRfx: NONREACTIVE

## 2024-01-18 MED ORDER — POTASSIUM CHLORIDE CRYS ER 20 MEQ PO TBCR
40.0000 meq | EXTENDED_RELEASE_TABLET | ORAL | Status: AC
  Administered 2024-01-18 (×2): 40 meq via ORAL
  Filled 2024-01-18 (×2): qty 2

## 2024-01-18 MED ORDER — ARFORMOTEROL TARTRATE 15 MCG/2ML IN NEBU
15.0000 ug | INHALATION_SOLUTION | Freq: Two times a day (BID) | RESPIRATORY_TRACT | Status: DC
  Administered 2024-01-18 – 2024-01-20 (×5): 15 ug via RESPIRATORY_TRACT
  Filled 2024-01-18 (×5): qty 2

## 2024-01-18 MED ORDER — IPRATROPIUM-ALBUTEROL 0.5-2.5 (3) MG/3ML IN SOLN: 3.0000 mL | RESPIRATORY_TRACT | Status: DC | PRN

## 2024-01-18 MED ORDER — GUAIFENESIN-DM 100-10 MG/5ML PO SYRP
5.0000 mL | ORAL_SOLUTION | ORAL | Status: DC | PRN
  Administered 2024-01-18 – 2024-01-21 (×6): 5 mL via ORAL
  Filled 2024-01-18 (×6): qty 5

## 2024-01-18 MED ORDER — MAGNESIUM SULFATE 2 GM/50ML IV SOLN
2.0000 g | Freq: Once | INTRAVENOUS | Status: AC
  Administered 2024-01-18: 2 g via INTRAVENOUS
  Filled 2024-01-18: qty 50

## 2024-01-18 MED ORDER — CALCIUM GLUCONATE-NACL 2-0.675 GM/100ML-% IV SOLN
2.0000 g | Freq: Once | INTRAVENOUS | Status: AC
  Administered 2024-01-18: 2000 mg via INTRAVENOUS
  Filled 2024-01-18: qty 100

## 2024-01-18 MED ORDER — POTASSIUM CHLORIDE 10 MEQ/100ML IV SOLN
10.0000 meq | INTRAVENOUS | Status: AC
  Administered 2024-01-18 (×4): 10 meq via INTRAVENOUS
  Filled 2024-01-18 (×5): qty 100

## 2024-01-18 MED ORDER — IPRATROPIUM-ALBUTEROL 0.5-2.5 (3) MG/3ML IN SOLN
3.0000 mL | Freq: Four times a day (QID) | RESPIRATORY_TRACT | Status: DC
  Administered 2024-01-18 – 2024-01-19 (×3): 3 mL via RESPIRATORY_TRACT
  Filled 2024-01-18 (×4): qty 3

## 2024-01-18 MED ORDER — ALBUTEROL SULFATE (2.5 MG/3ML) 0.083% IN NEBU
10.0000 mg/h | INHALATION_SOLUTION | Freq: Once | RESPIRATORY_TRACT | Status: AC
  Administered 2024-01-18: 10 mg/h via RESPIRATORY_TRACT
  Filled 2024-01-18: qty 12

## 2024-01-18 MED ORDER — MORPHINE SULFATE (PF) 2 MG/ML IV SOLN
2.0000 mg | INTRAVENOUS | Status: AC
  Administered 2024-01-18: 2 mg via INTRAVENOUS
  Filled 2024-01-18: qty 1

## 2024-01-18 MED ORDER — MONTELUKAST SODIUM 10 MG PO TABS
10.0000 mg | ORAL_TABLET | Freq: Every day | ORAL | Status: DC
  Administered 2024-01-18 – 2024-01-21 (×4): 10 mg via ORAL
  Filled 2024-01-18 (×4): qty 1

## 2024-01-18 MED ORDER — SODIUM CHLORIDE 0.9 % IV SOLN
500.0000 mg | INTRAVENOUS | Status: DC
  Administered 2024-01-18 – 2024-01-19 (×2): 500 mg via INTRAVENOUS
  Filled 2024-01-18 (×2): qty 5

## 2024-01-18 MED ORDER — BUDESON-GLYCOPYRROL-FORMOTEROL 160-9-4.8 MCG/ACT IN AERO: 2.0000 | INHALATION_SPRAY | Freq: Two times a day (BID) | RESPIRATORY_TRACT | Status: DC

## 2024-01-18 MED ORDER — PROCHLORPERAZINE EDISYLATE 10 MG/2ML IJ SOLN: 5.0000 mg | Freq: Four times a day (QID) | INTRAMUSCULAR | Status: DC | PRN

## 2024-01-18 MED ORDER — MORPHINE SULFATE (PF) 2 MG/ML IV SOLN
2.0000 mg | INTRAVENOUS | Status: DC | PRN
Start: 2024-01-18 — End: 2024-01-22
  Administered 2024-01-18 – 2024-01-21 (×8): 2 mg via INTRAVENOUS
  Filled 2024-01-18 (×9): qty 1

## 2024-01-18 MED ORDER — APIXABAN 5 MG PO TABS
5.0000 mg | ORAL_TABLET | Freq: Two times a day (BID) | ORAL | Status: DC
  Administered 2024-01-18 – 2024-01-22 (×9): 5 mg via ORAL
  Filled 2024-01-18 (×9): qty 1

## 2024-01-18 MED ORDER — ACETAMINOPHEN 500 MG PO TABS
500.0000 mg | ORAL_TABLET | Freq: Four times a day (QID) | ORAL | Status: DC | PRN
  Administered 2024-01-19: 500 mg via ORAL
  Filled 2024-01-18: qty 1

## 2024-01-18 MED ORDER — SODIUM CHLORIDE 0.9 % IV SOLN
100.0000 mg | Freq: Once | INTRAVENOUS | Status: DC
  Filled 2024-01-18: qty 100

## 2024-01-18 MED ORDER — EZETIMIBE 10 MG PO TABS
10.0000 mg | ORAL_TABLET | Freq: Every day | ORAL | Status: DC
  Administered 2024-01-18 – 2024-01-22 (×5): 10 mg via ORAL
  Filled 2024-01-18 (×5): qty 1

## 2024-01-18 MED ORDER — BUDESONIDE 0.25 MG/2ML IN SUSP
0.2500 mg | Freq: Two times a day (BID) | RESPIRATORY_TRACT | Status: DC
  Administered 2024-01-18 – 2024-01-22 (×9): 0.25 mg via RESPIRATORY_TRACT
  Filled 2024-01-18 (×9): qty 2

## 2024-01-18 MED ORDER — GABAPENTIN 100 MG PO CAPS
100.0000 mg | ORAL_CAPSULE | Freq: Every day | ORAL | Status: DC
  Administered 2024-01-18 – 2024-01-21 (×4): 100 mg via ORAL
  Filled 2024-01-18 (×4): qty 1

## 2024-01-18 MED ORDER — POLYETHYLENE GLYCOL 3350 17 G PO PACK
17.0000 g | PACK | Freq: Every day | ORAL | Status: DC | PRN
  Administered 2024-01-20: 17 g via ORAL
  Filled 2024-01-18: qty 1

## 2024-01-18 MED ORDER — METHYLPREDNISOLONE SODIUM SUCC 40 MG IJ SOLR
40.0000 mg | Freq: Every day | INTRAMUSCULAR | Status: DC
  Administered 2024-01-18 – 2024-01-22 (×5): 40 mg via INTRAVENOUS
  Filled 2024-01-18 (×5): qty 1

## 2024-01-18 NOTE — ED Provider Notes (Signed)
 [place] EMERGENCY DEPARTMENT AT [facility] Provider Note   CSN: [id number] Arrival date & time: 01/18/24  9675     Patient presents with: Respiratory Distress   [name] is a 63 y.o. male.   63 year old male presents nurser distress on CPAP.  Patient states he felt bad for last couple days with cough and breathing.  States that it got worse tonight so he called EMS.  On EMS arrival patient's oxygen  saturations on room air were in the 80s.  He had diminished breath sounds, wheezing and rhonchi.  He started on CPAP gave him 2 DuoNebs, Solu-Medrol , magnesium  and epinephrine .  Patient states he feels better however.  Paramedics state that he still tachypneic but improved based on their initial evaluation.  Patient has a history of asthma however on review of the records it appears he has COPD.        Prior to Admission medications   Medication Sig Start Date End Date Taking? Authorizing Provider  albuterol  (PROAIR  HFA) 108 (90 Base) MCG/ACT inhaler Inhale 2 puffs into the lungs every 6 (six) hours as needed for wheezing or shortness of breath. 04/19/23  Yes Delbert Clam, MD  albuterol  (PROVENTIL ) (2.5 MG/3ML) 0.083% nebulizer solution Take 3 mLs (2.5 mg total) by nebulization every 6 (six) hours as needed for wheezing or shortness of breath. 04/19/23  Yes Newlin, Enobong, MD  apixaban  (ELIQUIS ) 5 MG TABS tablet Take 1 tablet (5 mg total) by mouth 2 (two) times daily. 04/19/23  Yes Newlin, Enobong, MD  Azelastine -Fluticasone  137-50 MCG/ACT SUSP Place 2 sprays into the nose every 12 (twelve) hours. 04/19/23  Yes Newlin, Enobong, MD  buPROPion  (WELLBUTRIN  XL) 150 MG 24 hr tablet Take 1 tablet (150 mg total) by mouth daily. For smoking cessation. 04/19/23  Yes Newlin, Enobong, MD  cetirizine  (ZYRTEC ) 10 MG tablet Take 1 tablet (10 mg total) by mouth daily. 06/07/22  Yes Marinda Rocky SAILOR, MD  empagliflozin  (JARDIANCE ) 10 MG TABS tablet Take 1 tablet (10 mg total) by mouth daily  before breakfast. 04/19/23  Yes Newlin, Enobong, MD  Evolocumab  140 MG/ML SOAJ Inject 140 mg into the skin every 14 (fourteen) days. 12/28/22  Yes Meng, Hao, PA  ezetimibe  (ZETIA ) 10 MG tablet Take 1 tablet (10 mg total) by mouth daily. 04/19/23  Yes Newlin, Enobong, MD  Fluticasone -Umeclidin-Vilant (TRELEGY ELLIPTA ) 100-62.5-25 MCG/ACT AEPB Inhale 1 puff into the lungs daily. 12/27/23  Yes Newlin, Enobong, MD  furosemide  (LASIX ) 40 MG tablet Take 1 tablet (40 mg total) by mouth daily as needed (May take 40mg  Daily for Swelling and Shortness of breath). 06/14/22  Yes Acharya, Gayatri A, MD  gabapentin  (NEURONTIN ) 100 MG capsule Take 1 capsule (100 mg total) by mouth at bedtime. 04/19/23  Yes Newlin, Enobong, MD  losartan  (COZAAR ) 25 MG tablet Take 1 tablet (25 mg total) by mouth daily. 04/19/23  Yes Newlin, Enobong, MD  methocarbamol  (ROBAXIN ) 500 MG tablet Take 1 tablet (500 mg total) by mouth every 8 (eight) hours as needed for muscle spasms. NEEDS MAILED 12/10/23  Yes Fleming, Zelda W, NP  metoprolol  succinate (TOPROL -XL) 25 MG 24 hr tablet Take 1 tablet (25 mg total) by mouth daily. 04/19/23  Yes Newlin, Enobong, MD  montelukast  (SINGULAIR ) 10 MG tablet Take 1 tablet (10 mg total) by mouth at bedtime. 04/19/23  Yes Newlin, Enobong, MD  pantoprazole  (PROTONIX ) 40 MG tablet Take 1 tablet (40 mg total) by mouth daily. 04/19/23  Yes Newlin, Enobong, MD  potassium chloride  SA (  KLOR-CON  M) 20 MEQ tablet Take 1 tablet (20 mEq total) by mouth daily. 08/31/23  Yes Newlin, Enobong, MD  isosorbide  mononitrate (IMDUR ) 30 MG 24 hr tablet Take 1 tablet (30 mg total) by mouth daily. Patient not taking: Reported on 01/18/2024 06/14/22   Acharya, Gayatri A, MD  Misc. Devices MISC Rolling walker with seat.  Diagnosis-recurrent falls 07/27/23   Newlin, Enobong, MD  nitroGLYCERIN  (NITROSTAT ) 0.4 MG SL tablet Place 1 tablet (0.4 mg total) under the tongue every 5 (five) minutes as needed for chest pain. Patient not taking: Reported  on 01/18/2024 06/14/22 09/26/24  Acharya, Gayatri A, MD    Allergies: Atorvastatin     Review of Systems  Updated Vital Signs BP (!) 151/90 (BP Location: Right Arm)   Pulse 91   Temp 97.7 F (36.5 C) (Axillary)   Resp (!) 21   Ht 6' 2 (1.88 m)   Wt 74.8 kg   SpO2 95%   BMI 21.18 kg/m   Physical Exam Vitals and nursing note reviewed.  Constitutional:      Appearance: He is well-developed.  HENT:     Head: Normocephalic and atraumatic.  Cardiovascular:     Rate and Rhythm: Tachycardia present.  Pulmonary:     Effort: Tachypnea and accessory muscle usage present. No respiratory distress.     Breath sounds: Decreased breath sounds and wheezing present.  Abdominal:     General: There is no distension.  Musculoskeletal:        General: Normal range of motion.     Cervical back: Normal range of motion.  Neurological:     Mental Status: He is alert.     (all labs ordered are listed, but only abnormal results are displayed) Labs Reviewed  CBC WITH DIFFERENTIAL/PLATELET - Abnormal; Notable for the following components:      Result Value   MCV 101.1 (*)    Platelets 141 (*)    Eosinophils Absolute 0.7 (*)    All other components within normal limits  COMPREHENSIVE METABOLIC PANEL WITH GFR - Abnormal; Notable for the following components:   Sodium 134 (*)    Potassium 2.0 (*)    Chloride 113 (*)    CO2 13 (*)    Glucose, Bld 103 (*)    BUN <5 (*)    Creatinine, Ser 0.42 (*)    Calcium  5.8 (*)    Total Protein 4.2 (*)    Albumin <1.5 (*)    All other components within normal limits  I-STAT ARTERIAL BLOOD GAS, ED - Abnormal; Notable for the following components:   pO2, Arterial 246 (*)    Acid-base deficit 4.0 (*)    All other components within normal limits  I-STAT CHEM 8, ED - Abnormal; Notable for the following components:   Glucose, Bld 150 (*)    All other components within normal limits  TROPONIN I (HIGH SENSITIVITY) - Abnormal; Notable for the following  components:   Troponin I (High Sensitivity) 34 (*)    All other components within normal limits  BRAIN NATRIURETIC PEPTIDE  HIV ANTIBODY (ROUTINE TESTING W REFLEX)  MAGNESIUM   TROPONIN I (HIGH SENSITIVITY)  TROPONIN I (HIGH SENSITIVITY)    EKG: EKG Interpretation Date/Time:  Friday January 18 2024 03:35:04 EDT Ventricular Rate:  97 PR Interval:  172 QRS Duration:  102 QT Interval:  380 QTC Calculation: 483 R Axis:   -4  Text Interpretation: Sinus tachycardia Multiform ventricular premature complexes Anteroseptal infarct, age indeterminate Baseline wander in lead(s) V2 Confirmed  by Lorette Mayo [phone]) on 01/18/2024 5:46:49 AM  Radiology: DG Chest Portable 1 View Result Date: 01/18/2024 EXAM: 1 VIEW(S) XRAY OF THE CHEST 01/18/2024 04:03:40 AM COMPARISON: 05/22/23 CLINICAL HISTORY: eval for sob FINDINGS: LUNGS AND PLEURA: Unchanged appearance of diffuse chronic bronchitic changes. No focal pulmonary opacity. No pulmonary edema. No pleural effusion. No pneumothorax. HEART AND MEDIASTINUM: Calcified aorta. No acute abnormality of the cardiac and mediastinal silhouettes. BONES AND SOFT TISSUES: Thoracic degenerative changes. No acute osseous abnormality. IMPRESSION: 1. No acute cardiopulmonary abnormality. Electronically signed by: Waddell Calk MD 01/18/2024 05:21 AM EDT RP Workstation: [id number]     .Critical Care  Performed by: Lorette Mayo, MD Authorized by: Lorette Mayo, MD   Critical care provider statement:    Critical care time (minutes):  30   Critical care was necessary to treat or prevent imminent or life-threatening deterioration of the following conditions:  Respiratory failure   Critical care was time spent personally by me on the following activities:  Development of treatment plan with patient or surrogate, discussions with consultants, evaluation of patient's response to treatment, examination of patient, ordering and review of laboratory studies, ordering and  review of radiographic studies, ordering and performing treatments and interventions, pulse oximetry, re-evaluation of patient's condition and review of old charts    Medications Ordered in the ED  apixaban  (ELIQUIS ) tablet 5 mg (has no administration in time range)  ezetimibe  (ZETIA ) tablet 10 mg (has no administration in time range)  budesonide -glycopyrrolate-formoterol  (BREZTRI) 160-9-4.8 MCG/ACT inhaler 2 puff (has no administration in time range)  gabapentin  (NEURONTIN ) capsule 100 mg (has no administration in time range)  montelukast  (SINGULAIR ) tablet 10 mg (has no administration in time range)  methylPREDNISolone  sodium succinate (SOLU-MEDROL ) 40 mg/mL injection 40 mg (has no administration in time range)  ipratropium-albuterol  (DUONEB) 0.5-2.5 (3) MG/3ML nebulizer solution 3 mL (has no administration in time range)  ipratropium-albuterol  (DUONEB) 0.5-2.5 (3) MG/3ML nebulizer solution 3 mL (has no administration in time range)  azithromycin  (ZITHROMAX ) 500 mg in sodium chloride  0.9 % 250 mL IVPB (has no administration in time range)  acetaminophen  (TYLENOL ) tablet 500 mg (has no administration in time range)  morphine  (PF) 2 MG/ML injection 2 mg (has no administration in time range)  prochlorperazine (COMPAZINE) injection 5 mg (has no administration in time range)  polyethylene glycol (MIRALAX  / GLYCOLAX ) packet 17 g (has no administration in time range)  potassium chloride  10 mEq in 100 mL IVPB (has no administration in time range)  potassium chloride  SA (KLOR-CON  M) CR tablet 40 mEq (has no administration in time range)  calcium  gluconate 2 g/ 100 mL sodium chloride  IVPB (has no administration in time range)  magnesium  sulfate IVPB 2 g 50 mL (has no administration in time range)  albuterol  (PROVENTIL ) (2.5 MG/3ML) 0.083% nebulizer solution (10 mg/hr Nebulization Given 01/18/24 0400)                                    Medical Decision Making Amount and/or Complexity of Data  Reviewed Labs: ordered. Radiology: ordered. ECG/medicine tests: ordered.  Risk Prescription drug management. Decision regarding hospitalization.   Patient received most of treatment for COPD exacerbation with EMS.  Given dose of antibiotics here continued on BiPAP along with a continuous albuterol .  His lungs opened up quite a bit more still wheezing and tachypneic.  Will continue BiPAP.  ABG was reassuring.  He did have a BMP that showed  very significant lab abnormalities however this was more consistent with saline and the most with the blood so it was redrawn and these were all within normal limits.  BMP was low making exacerbation of heart failure unlikely.  Discussed with hospitalist for admission to stepdown on BiPAP.   Final diagnoses:  COPD exacerbation [facility])    ED Discharge Orders     None          Cali Hope, Selinda, MD 01/18/24 [phone]

## 2024-01-18 NOTE — Progress Notes (Signed)
   01/18/24 0300  Spiritual Encounters  Type of Visit Initial  Care provided to: Pt not available  Referral source Trauma page  Reason for visit Trauma  OnCall Visit No   Chaplain responded to a level two trauma. No family present. If a chaplain is requested someone will respond.  Carley Birmingham [facility]  [phone]

## 2024-01-18 NOTE — Hospital Course (Addendum)
 [name] is a 63 year old male with PMH COPD, ongoing tobacco use, asthma, CAD, chronic diastolic CHF, HTN, HLD, osteoarthritis who presented with significant shortness of breath.  He has had associated cough but no production of sputum due to difficulty moving adequate air.  He has felt hot and sweaty but unsure if he has had fevers. He was admitted for COPD exacerbation.   Assessment/Plan:  Elevated troponin, suspect demand ischemia -Troponin uptrending with some chest pain which may be due to dyspnea and COPD exacerbation - given CP persistence with uptrending troponin, will ask for cardiology input as well - EKG repeated this am too showing new TWI diffusely  - he reported to cardiology today he also used cocaine  recently; follow up UDS - last echo 06/28/22: EF 40-45%, Gr 1 DD - last LHC 01/11/23, stable two-vessel CAD at that time -Morphine  for air hunger and chest pain - trend trop to peak  - follow up repeat echo   Hyperkalemia - was hypoK on admission; likely over-repleted - EKG this morning noted with prominent TWI and prolonged QTC - given lasix  and lokelma; repeat BMP this afternoon and further treatment as necessary  History of recurrent PE/DVT on Eliquis  Resumed home Eliquis  - given persistent tachycardia, CP, hypoxia and hx noncompliance, will obtain CTA chest to evaluate  Acute COPD/asthma exacerbation Current smoker and coming down on tobacco use - IV Solu-Medrol , IV azithromycin  - Start on budesonide , Brovana , DuoNebs   Acute hypoxic respiratory failure -Not on oxygen  at home -Required BiPAP on admission due to increased WOB - remains off bipap -Continue oxygen  and wean as able   Tobacco use disorder Cutting down on tobacco use Counseled on the importance of tobacco cessation The patient was receptive.   Generalized weakness PT OT assessment. Fall precautions   Chronic combined diastolic and systolic CHF Last 2D echo done on 06/28/2022 revealed LVEF 40 to  45% and grade 1 diastolic dysfunction. Euvolemic on exam Monitor strict I's and O's and daily weight  Hypophosphatemia - Replete as needed

## 2024-01-18 NOTE — Progress Notes (Signed)
 Progress Note    [name]   FMW:[id number]  DOB: [birth year]  DOA: 01/18/2024     0 PCP: [name] Clam, MD  Initial CC: Shortness of breath  Hospital Course: [name] is a 63 year old male with PMH COPD, ongoing tobacco use, asthma, CAD, chronic diastolic CHF, HTN, HLD, osteoarthritis who presented with significant shortness of breath.  He has had associated cough but no production of sputum due to difficulty moving adequate air.  He has felt hot and sweaty but unsure if he has had fevers. He was admitted for COPD exacerbation.   Assessment/Plan:  Acute COPD/asthma exacerbation Current smoker and coming down on tobacco use - IV Solu-Medrol , IV azithromycin  - Start on budesonide , Brovana , DuoNebs   Acute hypoxic respiratory failure -Not on oxygen  at home -Required BiPAP on admission due to increased WOB -Now off BiPAP this morning -Continue oxygen  and wean as able   History of recurrent PE/DVT on Eliquis  Resume home Eliquis    Tobacco use disorder Cutting down on tobacco use Counseled on the importance of tobacco cessation The patient was receptive.   Generalized weakness PT OT assessment. Fall precautions   Elevated troponin, suspect demand ischemia -Troponin uptrending with some chest pain which may be due to dyspnea and COPD exacerbation - last echo 06/28/22: EF 40-45%, Gr 1 DD - last LHC 01/11/23, stable two-vessel CAD at that time -EKG repeated this morning, no ischemia noted -Morphine  for air hunger and chest pain - trend trop to peak    Chronic combined diastolic and systolic CHF Last 2D echo done on 06/28/2022 revealed LVEF 40 to 45% and grade 1 diastolic dysfunction. Euvolemic on exam Monitor strict I's and O's and daily weight  Interval History:  Breathing little bit better when seen this morning in the ER and moving better air.  Still has significant wheezing and dyspnea but improved.  BiPAP also taken off this  morning.   Antimicrobials: Azithromycin  01/18/2024 >> current  DVT prophylaxis:   apixaban  (ELIQUIS ) tablet 5 mg   Code Status:   Code Status: Full Code  Mobility Assessment (Last 72 Hours)     Mobility Assessment   No documentation.     Barriers to discharge: None Disposition Plan: Home HH orders placed: N/A Status is: Inpatient  Objective: Blood pressure 138/87, pulse 87, temperature 97.6 F (36.4 C), temperature source Oral, resp. rate (!) 21, height 6' 2 (1.88 m), weight 74.8 kg, SpO2 100%.  Examination:  Physical Exam Constitutional:      General: He is not in acute distress.    Appearance: Normal appearance.  HENT:     Head: Normocephalic and atraumatic.     Mouth/Throat:     Mouth: Mucous membranes are moist.  Eyes:     Extraocular Movements: Extraocular movements intact.  Cardiovascular:     Rate and Rhythm: Normal rate and regular rhythm.  Pulmonary:     Effort: Pulmonary effort is normal. No respiratory distress.     Breath sounds: Decreased air movement present. Wheezing (Diffuse) present.  Abdominal:     General: Bowel sounds are normal. There is no distension.     Palpations: Abdomen is soft.     Tenderness: There is no abdominal tenderness.  Musculoskeletal:        General: Normal range of motion.     Cervical back: Normal range of motion and neck supple.  Skin:    General: Skin is warm and dry.  Neurological:     General: No focal deficit present.  Mental Status: He is alert.  Psychiatric:        Mood and Affect: Mood normal.        Behavior: Behavior normal.      Consultants:    Procedures:    Data Reviewed: Results for orders placed or performed during the hospital encounter of 01/18/24 (from the past 24 hours)  Brain natriuretic peptide     Status: None   Collection Time: 01/18/24  3:27 AM  Result Value Ref Range   B Natriuretic Peptide 45.8 0.0 - 100.0 pg/mL  CBC with Differential     Status: Abnormal   Collection Time:  01/18/24  3:56 AM  Result Value Ref Range   WBC 5.3 4.0 - 10.5 K/uL   RBC 4.35 4.22 - 5.81 MIL/uL   Hemoglobin 14.1 13.0 - 17.0 g/dL   HCT 55.9 60.9 - 47.9 %   MCV 101.1 (H) 80.0 - 100.0 fL   MCH 32.4 26.0 - 34.0 pg   MCHC 32.0 30.0 - 36.0 g/dL   RDW 85.3 88.4 - 84.4 %   Platelets 141 (L) 150 - 400 K/uL   nRBC 0.0 0.0 - 0.2 %   Neutrophils Relative % 44 %   Neutro Abs 2.4 1.7 - 7.7 K/uL   Lymphocytes Relative 30 %   Lymphs Abs 1.6 0.7 - 4.0 K/uL   Monocytes Relative 11 %   Monocytes Absolute 0.6 0.1 - 1.0 K/uL   Eosinophils Relative 14 %   Eosinophils Absolute 0.7 (H) 0.0 - 0.5 K/uL   Basophils Relative 1 %   Basophils Absolute 0.0 0.0 - 0.1 K/uL   Immature Granulocytes 0 %   Abs Immature Granulocytes 0.02 0.00 - 0.07 K/uL  Comprehensive metabolic panel     Status: Abnormal   Collection Time: 01/18/24  3:56 AM  Result Value Ref Range   Sodium 134 (L) 135 - 145 mmol/L   Potassium 2.0 (LL) 3.5 - 5.1 mmol/L   Chloride 113 (H) 98 - 111 mmol/L   CO2 13 (L) 22 - 32 mmol/L   Glucose, Bld 103 (H) 70 - 99 mg/dL   BUN <5 (L) 8 - 23 mg/dL   Creatinine, Ser 9.57 (L) 0.61 - 1.24 mg/dL   Calcium  5.8 (LL) 8.9 - 10.3 mg/dL   Total Protein 4.2 (L) 6.5 - 8.1 g/dL   Albumin <8.4 (L) 3.5 - 5.0 g/dL   AST 18 15 - 41 U/L   ALT 13 0 - 44 U/L   Alkaline Phosphatase 41 38 - 126 U/L   Total Bilirubin 0.5 0.0 - 1.2 mg/dL   GFR, Estimated >39 >39 mL/min   Anion gap 8 5 - 15  Troponin I (High Sensitivity)     Status: None   Collection Time: 01/18/24  3:56 AM  Result Value Ref Range   Troponin I (High Sensitivity) 11 <18 ng/L  I-Stat arterial blood gas, ED ([facility] ED, [facility], [facility])     Status: Abnormal   Collection Time: 01/18/24  4:16 AM  Result Value Ref Range   pH, Arterial 7.358 7.35 - 7.45   pCO2 arterial 36.8 32 - 48 mmHg   pO2, Arterial 246 (H) 83 - 108 mmHg   Bicarbonate 20.8 20.0 - 28.0 mmol/L   TCO2 22 22 - 32 mmol/L   O2 Saturation 100 %   Acid-base deficit 4.0 (H) 0.0 - 2.0 mmol/L    Sodium 140 135 - 145 mmol/L   Potassium 3.7 3.5 - 5.1 mmol/L   Calcium , Ion  1.29 1.15 - 1.40 mmol/L   HCT 43.0 39.0 - 52.0 %   Hemoglobin 14.6 13.0 - 17.0 g/dL   Patient temperature 02.2 F    Sample type ARTERIAL   Troponin I (High Sensitivity)     Status: Abnormal   Collection Time: 01/18/24  5:27 AM  Result Value Ref Range   Troponin I (High Sensitivity) 34 (H) <18 ng/L  I-stat chem 8, ED (not at [facility], [facility] or [facility])     Status: Abnormal   Collection Time: 01/18/24  5:45 AM  Result Value Ref Range   Sodium 141 135 - 145 mmol/L   Potassium 4.1 3.5 - 5.1 mmol/L   Chloride 108 98 - 111 mmol/L   BUN 10 8 - 23 mg/dL   Creatinine, Ser 9.29 0.61 - 1.24 mg/dL   Glucose, Bld 849 (H) 70 - 99 mg/dL   Calcium , Ion 1.19 1.15 - 1.40 mmol/L   TCO2 22 22 - 32 mmol/L   Hemoglobin 15.6 13.0 - 17.0 g/dL   HCT 53.9 60.9 - 47.9 %  HIV Antibody (routine testing w rflx)     Status: None   Collection Time: 01/18/24  7:08 AM  Result Value Ref Range   HIV Screen 4th Generation wRfx Non Reactive Non Reactive  Magnesium      Status: None   Collection Time: 01/18/24  7:08 AM  Result Value Ref Range   Magnesium  2.4 1.7 - 2.4 mg/dL  Troponin I (High Sensitivity)     Status: Abnormal   Collection Time: 01/18/24  7:34 AM  Result Value Ref Range   Troponin I (High Sensitivity) 83 (H) <18 ng/L    I have reviewed pertinent nursing notes, vitals, labs, and images as necessary. I have ordered labwork to follow up on as indicated.  I have reviewed the last notes from staff over past 24 hours. I have discussed patient's care plan and test results with nursing staff, CM/SW, and other staff as appropriate.  Old records reviewed in assessment of this patient  Time spent: Greater than 50% of the 55 minute visit was spent in counseling/coordination of care for the patient as laid out in the A&P.   LOS: 0 days   Alm Apo, MD [facility] 01/18/2024, 12:33 PM

## 2024-01-18 NOTE — Plan of Care (Signed)

## 2024-01-18 NOTE — H&P (Addendum)
 History and Physical  AZREAL STTHOMAS FMW:[id number] DOB: [birth year] DOA: 01/18/2024  Referring physician: Dr. Lorette, EDP  PCP: Delbert Clam, MD  Outpatient Specialists: Neurology. Patient coming from: Home.  Chief Complaint: Shortness of breath.  HPI: [name] is a 63 y.o. male with medical history significant for COPD, current smoker, asthma, hypertension, coronary artery disease, chronic combined diastolic and systolic CHF, history of cocaine  abuse in 2017, history of hepatitis C, polyclonal gammopathy, PE/DVT on Eliquis , who presents to the ER due to shortness of breath that started a few days ago and worsening.  Associated with a nonproductive cough and wheezing.  EMS was activated.  Upon EMS arrival, the patient was saturating 86% with increased work of breathing.  He was placed on CPAP.  No reported fevers or chills.  Endorses generalized weakness and fatigue for the past few days.  In the ER, the patient was switched to BiPAP due to increased work of breathing.  ED Course: Temperature 97.7.  BP 150/90, pulse 91, respiratory 21, O2 saturation 95% on BiPAP.  Review of Systems: Review of systems as noted in the HPI. All other systems reviewed and are negative.   Past Medical History:  Diagnosis Date   Asthma    CAD (coronary artery disease)    a. 09/2013 NSTEMI/PCI: LM nl, LAD 40-50%, D1 100 (2.25 x 28 Vision BMS), LCX min irregs, RI 60-70, 30, RCA 40-50/50-11ms/p, EF 45-50%.  b. cath 12/2014 -occulded BMS in diag, 50% pro to mid LAD, 60% ramus, 30% RCA    Chest pain 08/2015   Chronic diastolic CHF (congestive heart failure) (HCC)    a. 09/2014 EF 45-50% by LV gram;  b. 01/2014 Echo: EF 55-60%, Gr 1 DD.   Cocaine  abuse (HCC)    COPD (chronic obstructive pulmonary disease) (HCC)    DVT (deep venous thrombosis) (HCC)    I had ~ 10 in each leg (12/20/2016)   Essential hypertension    Hepatitis C    clear free over a year now   High cholesterol    Myocardial  infarction [facility]) ~ 2014   Osteoarthritis    a. hands and toes.   Pneumonia    several times (12/20/2016)   Pulmonary embolism (HCC)    a. 2012 - s/p IVC filter;  b. prev on eliquis  - noncompliant.   Shortness of breath dyspnea    Sinus headache    Squamous cell skin cancer 07/13/2016   foot   Tobacco abuse    Past Surgical History:  Procedure Laterality Date   BACK SURGERY  2022   CARDIAC CATHETERIZATION N/A 01/04/2015   Procedure: Left Heart Cath and Coronary Angiography;  Surgeon: Lonni JONETTA Cash, MD;  Location: [facility] INVASIVE CV LAB;  Service: Cardiovascular;  Laterality: N/A;   CORONARY ANGIOPLASTY WITH STENT PLACEMENT  10/21/2013   BMS to D1   CYST EXCISION N/A 05/10/2016   Procedure: EXCISION OF SEBACEOUS CYST UPPER BACK;  Surgeon: Donnice Lima, MD;  Location: [facility] OR;  Service: General;  Laterality: N/A;   FOOT SURGERY Right ~ 06/2016   said there was skin cancer on it   LEFT HEART CATH AND CORONARY ANGIOGRAPHY N/A 01/11/2023   Procedure: LEFT HEART CATH AND CORONARY ANGIOGRAPHY;  Surgeon: Cash Lonni JONETTA, MD;  Location: [facility] INVASIVE CV LAB;  Service: Cardiovascular;  Laterality: N/A;   LEFT HEART CATHETERIZATION WITH CORONARY ANGIOGRAM N/A 10/21/2013   Procedure: LEFT HEART CATHETERIZATION WITH CORONARY ANGIOGRAM;  Surgeon: Alm LELON Clay, MD;  Location[facility]  CATH LAB;  Service: Cardiovascular;  Laterality: N/A;   PAROTIDECTOMY Left 03/19/2023   Procedure: PAROTIDECTOMY;  Surgeon: Jesus Oliphant, MD;  Location: [facility] OR;  Service: ENT;  Laterality: Left;   VENA CAVA FILTER PLACEMENT  ~ 2007-~ 2017   1 in my neck; 2 in my wrist    Social History:  reports that he has been smoking cigarettes. He has a 35 pack-year smoking history. He has never been exposed to tobacco smoke. He has never used smokeless tobacco. He reports current alcohol use of about 6.0 standard drinks of alcohol per week. He reports that he does not currently use drugs after having used the following  drugs: Cocaine .   Allergies  Allergen Reactions   Atorvastatin      myalgia    Family History  Problem Relation Age of Onset   Asthma Mother    Allergies Mother    Allergies Sister    Allergies Brother    Deep vein thrombosis Brother        two brothers with recurrent DVT   Heart attack Father        a.60s b. deceased in his 26s   Coronary artery disease Father    Colon cancer Neg Hx    Liver cancer Neg Hx    Esophageal cancer Neg Hx    Colon polyps Neg Hx    Rectal cancer Neg Hx    Pancreatic cancer Neg Hx    Stomach cancer Neg Hx       Prior to Admission medications   Medication Sig Start Date End Date Taking? Authorizing Provider  albuterol  (PROAIR  HFA) 108 (90 Base) MCG/ACT inhaler Inhale 2 puffs into the lungs every 6 (six) hours as needed for wheezing or shortness of breath. 04/19/23  Yes Delbert Clam, MD  albuterol  (PROVENTIL ) (2.5 MG/3ML) 0.083% nebulizer solution Take 3 mLs (2.5 mg total) by nebulization every 6 (six) hours as needed for wheezing or shortness of breath. 04/19/23  Yes Newlin, Enobong, MD  apixaban  (ELIQUIS ) 5 MG TABS tablet Take 1 tablet (5 mg total) by mouth 2 (two) times daily. 04/19/23  Yes Newlin, Enobong, MD  Azelastine -Fluticasone  137-50 MCG/ACT SUSP Place 2 sprays into the nose every 12 (twelve) hours. 04/19/23  Yes Newlin, Enobong, MD  buPROPion  (WELLBUTRIN  XL) 150 MG 24 hr tablet Take 1 tablet (150 mg total) by mouth daily. For smoking cessation. 04/19/23  Yes Newlin, Enobong, MD  cetirizine  (ZYRTEC ) 10 MG tablet Take 1 tablet (10 mg total) by mouth daily. 06/07/22  Yes Marinda Rocky SAILOR, MD  empagliflozin  (JARDIANCE ) 10 MG TABS tablet Take 1 tablet (10 mg total) by mouth daily before breakfast. 04/19/23  Yes Newlin, Enobong, MD  Evolocumab  140 MG/ML SOAJ Inject 140 mg into the skin every 14 (fourteen) days. 12/28/22  Yes Meng, Hao, PA  ezetimibe  (ZETIA ) 10 MG tablet Take 1 tablet (10 mg total) by mouth daily. 04/19/23  Yes Newlin, Enobong, MD   Fluticasone -Umeclidin-Vilant (TRELEGY ELLIPTA ) 100-62.5-25 MCG/ACT AEPB Inhale 1 puff into the lungs daily. 12/27/23  Yes Newlin, Enobong, MD  furosemide  (LASIX ) 40 MG tablet Take 1 tablet (40 mg total) by mouth daily as needed (May take 40mg  Daily for Swelling and Shortness of breath). 06/14/22  Yes Acharya, Gayatri A, MD  gabapentin  (NEURONTIN ) 100 MG capsule Take 1 capsule (100 mg total) by mouth at bedtime. 04/19/23  Yes Newlin, Enobong, MD  losartan  (COZAAR ) 25 MG tablet Take 1 tablet (25 mg total) by mouth daily. 04/19/23  Yes Newlin, Enobong, MD  methocarbamol  (ROBAXIN ) 500 MG tablet Take 1 tablet (500 mg total) by mouth every 8 (eight) hours as needed for muscle spasms. NEEDS MAILED 12/10/23  Yes Fleming, Zelda W, NP  metoprolol  succinate (TOPROL -XL) 25 MG 24 hr tablet Take 1 tablet (25 mg total) by mouth daily. 04/19/23  Yes Newlin, Enobong, MD  montelukast  (SINGULAIR ) 10 MG tablet Take 1 tablet (10 mg total) by mouth at bedtime. 04/19/23  Yes Newlin, Enobong, MD  pantoprazole  (PROTONIX ) 40 MG tablet Take 1 tablet (40 mg total) by mouth daily. 04/19/23  Yes Newlin, Enobong, MD  potassium chloride  SA (KLOR-CON  M) 20 MEQ tablet Take 1 tablet (20 mEq total) by mouth daily. 08/31/23  Yes Newlin, Enobong, MD  isosorbide  mononitrate (IMDUR ) 30 MG 24 hr tablet Take 1 tablet (30 mg total) by mouth daily. Patient not taking: Reported on 01/18/2024 06/14/22   Acharya, Gayatri A, MD  Misc. Devices MISC Rolling walker with seat.  Diagnosis-recurrent falls 07/27/23   Newlin, Enobong, MD  nitroGLYCERIN  (NITROSTAT ) 0.4 MG SL tablet Place 1 tablet (0.4 mg total) under the tongue every 5 (five) minutes as needed for chest pain. Patient not taking: Reported on 01/18/2024 06/14/22 09/26/24  Loni Soyla LABOR, MD    Physical Exam: BP (!) 151/90 (BP Location: Right Arm)   Pulse 91   Temp 97.7 F (36.5 C) (Axillary)   Resp (!) 21   Ht 6' 2 (1.88 m)   Wt 74.8 kg   SpO2 95%   BMI 21.18 kg/m   General: 63 y.o.  year-old male well developed well nourished in no acute distress.  Alert and oriented x3. Cardiovascular: Regular rate and rhythm with no rubs or gallops.  No thyromegaly or JVD noted.  No lower extremity edema. 2/4 pulses in all 4 extremities. Respiratory: Diffuse wheezing with poor inspiratory effort. Abdomen: Soft nontender nondistended with normal bowel sounds x4 quadrants. Muskuloskeletal: No cyanosis, clubbing or edema noted bilaterally Neuro: CN II-XII intact, strength, sensation, reflexes Skin: No ulcerative lesions noted or rashes Psychiatry: Judgement and insight appear normal. Mood is appropriate for condition and setting          Labs on Admission:  Basic Metabolic Panel: Recent Labs  Lab 01/18/24 0356 01/18/24 0416 01/18/24 0545  NA 134* 140 141  K 2.0* 3.7 4.1  CL 113*  --  108  CO2 13*  --   --   GLUCOSE 103*  --  150*  BUN <5*  --  10  CREATININE 0.42*  --  0.70  CALCIUM  5.8*  --   --    Liver Function Tests: Recent Labs  Lab 01/18/24 0356  AST 18  ALT 13  ALKPHOS 41  BILITOT 0.5  PROT 4.2*  ALBUMIN <1.5*   No results for input(s): LIPASE, AMYLASE in the last 168 hours. No results for input(s): AMMONIA in the last 168 hours. CBC: Recent Labs  Lab 01/18/24 0356 01/18/24 0416 01/18/24 0545  WBC 5.3  --   --   NEUTROABS 2.4  --   --   HGB 14.1 14.6 15.6  HCT 44.0 43.0 46.0  MCV 101.1*  --   --   PLT 141*  --   --    Cardiac Enzymes: No results for input(s): CKTOTAL, CKMB, CKMBINDEX, TROPONINI in the last 168 hours.  BNP (last 3 results) Recent Labs    01/18/24 0327  BNP 45.8    ProBNP (last 3 results) No results for input(s): PROBNP in the last 8760 hours.  CBG:  No results for input(s): GLUCAP in the last 168 hours.  Radiological Exams on Admission: DG Chest Portable 1 View Result Date: 01/18/2024 EXAM: 1 VIEW(S) XRAY OF THE CHEST 01/18/2024 04:03:40 AM COMPARISON: 05/22/23 CLINICAL HISTORY: eval for sob FINDINGS:  LUNGS AND PLEURA: Unchanged appearance of diffuse chronic bronchitic changes. No focal pulmonary opacity. No pulmonary edema. No pleural effusion. No pneumothorax. HEART AND MEDIASTINUM: Calcified aorta. No acute abnormality of the cardiac and mediastinal silhouettes. BONES AND SOFT TISSUES: Thoracic degenerative changes. No acute osseous abnormality. IMPRESSION: 1. No acute cardiopulmonary abnormality. Electronically signed by: Waddell Calk MD 01/18/2024 05:21 AM EDT RP Workstation: [id number]    EKG: I independently viewed the EKG done and my findings are as followed: Sinus rhythm rate of 97.  Nonspecific T changes.  QTc 43.  Assessment/Plan Present on Admission:  Acute exacerbation of chronic obstructive pulmonary disease (COPD) (HCC)  Principal Problem:   Acute exacerbation of chronic obstructive pulmonary disease (COPD) (HCC)  Acute COPD/asthma exacerbation Current smoker and coming down on tobacco use Continue bronchodilator nebulizer, IV Solu-Medrol , IV azithromycin  Resume home asthma/COPD regimen Maintain O2 saturation above 90%  Acute respiratory failure Currently on BiPAP due to increased work of breathing Wean off BiPAP as tolerated Continue bronchodilators, maintain O2 saturation above 90%  History of recurrent PE/DVT on Eliquis  Resume home Eliquis   Tobacco use disorder Cutting down on tobacco use Counseled on the importance of tobacco cessation The patient was receptive.  Generalized weakness PT OT assessment. Fall precautions  Elevated troponin, suspect demand ischemia Troponin 34 No reported anginal symptoms No evidence of acute ischemia on twelve-lead EKG Trend troponin and closely monitor on telemetry.  Chronic combined diastolic and systolic CHF Last 2D echo done on 06/28/2022 revealed LVEF 40 to 45% and grade 1 diastolic dysfunction. Euvolemic on exam Monitor strict I's and O's and daily weight   Time: 75 minutes.   DVT prophylaxis: Home  Eliquis .  Code Status: Full code.  Family Communication: None at bedside.  Disposition Plan: Admitted to progressive care unit  Consults called: None.  Admission status: Inpatient status.   Status is: Inpatient The patient requires at least 2 midnights for further evaluation and treatment of present condition.   Terry LOISE Hurst MD [facility] Pager [phone]  If 7PM-7AM, please contact night-coverage [email or web address] Password [email or web address]  01/18/2024, 6:59 AM

## 2024-01-18 NOTE — Progress Notes (Signed)
 Came to room for breathing treatment, pt is currently off the floor.

## 2024-01-18 NOTE — ED Triage Notes (Signed)
 Patient from [facility], called 911 and was waiting outside when EMS arrived.Patient states that the SOB started October 22nd and continues on the 23rd with no relief and the 24th it became worse. EMS states patient sats in the 86% with respirations at 40bpm. Lung sounds noted by EMS were course expiration sand high pitch wheezing inspirations. Patient was given 125 mg Solumedrol , 2 mg of mag and 0.3mg  of epi. Patient placed on CPAP and sats are at 100% and respirations down to 26-28 bpm.Hx of STEMI and asthma. Never intubated. IV: 20 G LAC

## 2024-01-19 ENCOUNTER — Inpatient Hospital Stay (HOSPITAL_COMMUNITY)

## 2024-01-19 ENCOUNTER — Encounter (HOSPITAL_COMMUNITY): Payer: Self-pay | Admitting: Internal Medicine

## 2024-01-19 DIAGNOSIS — R7989 Other specified abnormal findings of blood chemistry: Secondary | ICD-10-CM

## 2024-01-19 DIAGNOSIS — I5022 Chronic systolic (congestive) heart failure: Secondary | ICD-10-CM | POA: Diagnosis not present

## 2024-01-19 DIAGNOSIS — J9601 Acute respiratory failure with hypoxia: Secondary | ICD-10-CM | POA: Diagnosis not present

## 2024-01-19 DIAGNOSIS — J441 Chronic obstructive pulmonary disease with (acute) exacerbation: Secondary | ICD-10-CM | POA: Diagnosis not present

## 2024-01-19 DIAGNOSIS — I2489 Other forms of acute ischemic heart disease: Secondary | ICD-10-CM | POA: Diagnosis not present

## 2024-01-19 LAB — BASIC METABOLIC PANEL WITH GFR
Anion gap: 8 (ref 5–15)
Anion gap: 10 (ref 5–15)
BUN: 15 mg/dL (ref 8–23)
BUN: 18 mg/dL (ref 8–23)
CO2: 21 mmol/L — ABNORMAL LOW (ref 22–32)
CO2: 21 mmol/L — ABNORMAL LOW (ref 22–32)
Calcium: 8.8 mg/dL — ABNORMAL LOW (ref 8.9–10.3)
Calcium: 9 mg/dL (ref 8.9–10.3)
Chloride: 102 mmol/L (ref 98–111)
Chloride: 106 mmol/L (ref 98–111)
Creatinine, Ser: 0.71 mg/dL (ref 0.61–1.24)
Creatinine, Ser: 0.96 mg/dL (ref 0.61–1.24)
GFR, Estimated: >60 mL/min (ref >60)
GFR, Estimated: >60 mL/min (ref >60)
Glucose, Bld: 141 mg/dL — ABNORMAL HIGH (ref 70–99)
Glucose, Bld: 152 mg/dL — ABNORMAL HIGH (ref 70–99)
Potassium: 6 mmol/L — ABNORMAL HIGH (ref 3.5–5.1)
Potassium: 5.3 mmol/L — ABNORMAL HIGH (ref 3.5–5.1)
Sodium: 131 mmol/L — ABNORMAL LOW (ref 135–145)
Sodium: 137 mmol/L (ref 135–145)

## 2024-01-19 LAB — RAPID URINE DRUG SCREEN, HOSP PERFORMED
Amphetamines: NOT DETECTED
Barbiturates: NOT DETECTED
Benzodiazepines: NOT DETECTED
Cocaine: POSITIVE — AB
Opiates: POSITIVE — AB
Tetrahydrocannabinol: NOT DETECTED

## 2024-01-19 LAB — CBC
HCT: 38.6 % — ABNORMAL LOW (ref 39.0–52.0)
Hemoglobin: 12.8 g/dL — ABNORMAL LOW (ref 13.0–17.0)
MCH: 32.6 pg (ref 26.0–34.0)
MCHC: 33.2 g/dL (ref 30.0–36.0)
MCV: 98.2 fL (ref 80.0–100.0)
Platelets: 122 K/uL — ABNORMAL LOW (ref 150–400)
RBC: 3.93 MIL/uL — ABNORMAL LOW (ref 4.22–5.81)
RDW: 14.3 % (ref 11.5–15.5)
WBC: 5.1 K/uL (ref 4.0–10.5)
nRBC: 0 % (ref 0.0–0.2)

## 2024-01-19 LAB — RESPIRATORY PANEL BY PCR

## 2024-01-19 LAB — TROPONIN I (HIGH SENSITIVITY)
Troponin I (High Sensitivity): 207 ng/L (ref <18)
Troponin I (High Sensitivity): 128 ng/L (ref <18)

## 2024-01-19 LAB — SARS CORONAVIRUS 2 BY RT PCR: SARS Coronavirus 2 by RT PCR: NEGATIVE

## 2024-01-19 LAB — MAGNESIUM: Magnesium: 2.2 mg/dL (ref 1.7–2.4)

## 2024-01-19 LAB — PHOSPHORUS: Phosphorus: 2.2 mg/dL — ABNORMAL LOW (ref 2.5–4.6)

## 2024-01-19 LAB — TSH: TSH: 0.886 u[IU]/mL (ref 0.350–4.500)

## 2024-01-19 MED ORDER — SODIUM ZIRCONIUM CYCLOSILICATE 10 G PO PACK
10.0000 g | PACK | Freq: Once | ORAL | Status: AC
  Administered 2024-01-19: 10 g via ORAL
  Filled 2024-01-19: qty 1

## 2024-01-19 MED ORDER — ISOSORBIDE MONONITRATE ER 30 MG PO TB24
30.0000 mg | ORAL_TABLET | Freq: Every day | ORAL | Status: DC
  Administered 2024-01-19 – 2024-01-22 (×4): 30 mg via ORAL
  Filled 2024-01-19 (×4): qty 1

## 2024-01-19 MED ORDER — FUROSEMIDE 10 MG/ML IJ SOLN
40.0000 mg | Freq: Once | INTRAMUSCULAR | Status: AC
  Administered 2024-01-19: 40 mg via INTRAVENOUS
  Filled 2024-01-19: qty 4

## 2024-01-19 MED ORDER — SODIUM PHOSPHATES 45 MMOLE/15ML IV SOLN
30.0000 mmol | Freq: Once | INTRAVENOUS | Status: AC
  Administered 2024-01-19: 30 mmol via INTRAVENOUS
  Filled 2024-01-19: qty 10

## 2024-01-19 MED ORDER — IOHEXOL 350 MG/ML SOLN
75.0000 mL | Freq: Once | INTRAVENOUS | Status: AC | PRN
  Administered 2024-01-19: 75 mL via INTRAVENOUS

## 2024-01-19 NOTE — Evaluation (Signed)
 Physical Therapy Evaluation Patient Details Name: [name] MRN: [id number] DOB: April 13, 1960 Today's Date: 01/19/2024  History of Present Illness  63 y.o. male presents to [facility] hospital on 01/18/24 due to shortness of breath. COPD exacerbation; BiPAP;  PMH includes PE/DVT on Eliquis , NSTEMI, CHF, HTN, COPD, Hepatitis C, liver fibrosis, psoriasis.  Clinical Impression  Pt admitted secondary to problem above with deficits below. PTA patient lives alone in an apartment with elevator to enter. He reports 8+ falls in past 6 months (inside and outside) and within the past week obtained a rollator.  Pt currently requires CGA with use of IV pole for ambulation x 100 ft (no RW in room; pt urgently needing to use restroom; HR 124 bpm with return to room for EKG as ordered by MD). Anticipate patient will benefit from PT to address problems listed below. Will continue to follow acutely to maximize functional mobility, independence, and safety. Pt can benefit from education in safe use of rollator and further balance assessment. Anticipate return home with [facility], however will be interested in how he does with OT as he may benefit from post-acute inpatient therapies <3 hrs/day if he is agreeable.     Addendum-See OT note. They discussed post-acute inpatient therapies <3 hrs/day with pt and he is agreeable. With lack of home support and history of multiple falls, feel this would be appropriate discharge plan.       If plan is discharge home, recommend the following: A little help with walking and/or transfers;Assistance with cooking/housework;Assist for transportation   Can travel by private vehicle        Equipment Recommendations BSC/3in1  Recommendations for Other Services  OT consult    Functional Status Assessment Patient has had a recent decline in their functional status and demonstrates the ability to make significant improvements in function in a reasonable and predictable amount of time.      Precautions / Restrictions Precautions Precautions: Fall Recall of Precautions/Restrictions: Intact Precaution/Restrictions Comments: 8+ falls in 6 months      Mobility  Bed Mobility Overal bed mobility: Modified Independent             General bed mobility comments: HOB elevated 35 degrees    Transfers Overall transfer level: Needs assistance Equipment used: None Transfers: Sit to/from Stand Sit to Stand: Contact guard assist           General transfer comment: no RW in room; pt needing to urgently use restroom    Ambulation/Gait Ambulation/Gait assistance: Contact guard assist Gait Distance (Feet): 100 Feet Assistive device: IV Pole Gait Pattern/deviations: Step-through pattern, Decreased stride length, Trunk flexed   Gait velocity interpretation: 1.31 - 2.62 ft/sec, indicative of limited community ambulator   General Gait Details: no RW in room so pt held onto IV pole; no drift or imbalance with single UE support  Stairs            Wheelchair Mobility     Tilt Bed    Modified Rankin (Stroke Patients Only)       Balance Overall balance assessment: History of Falls                                           Pertinent Vitals/Pain Pain Assessment Pain Assessment: No/denies pain    Home Living Family/patient expects to be discharged to:: Private residence Living Arrangements: Alone Available Help at Discharge: Neighbor  Type of Home: Apartment Home Access: Elevator       Home Layout: One level Home Equipment: Rollator (4 wheels);Grab bars - toilet;Grab bars - tub/shower Additional Comments: pt got rollator in past week; has barely used    Prior Function Prior Level of Function : Independent/Modified Independent;History of Falls (last six months) (8+ falls in 6 mos)             Mobility Comments: reports falls inside and outside; reports fall trying to get up off toilet       Extremity/Trunk Assessment    Upper Extremity Assessment Upper Extremity Assessment: Defer to OT evaluation    Lower Extremity Assessment Lower Extremity Assessment: Generalized weakness    Cervical / Trunk Assessment Cervical / Trunk Assessment: Kyphotic  Communication   Communication Communication: No apparent difficulties    Cognition Arousal: Alert Behavior During Therapy: WFL for tasks assessed/performed   PT - Cognitive impairments: Awareness, Safety/Judgement                       PT - Cognition Comments: decr awareness of deficits and potential for serious injury with falling Following commands: Intact       Cueing Cueing Techniques: Verbal cues     General Comments General comments (skin integrity, edema, etc.): HR 106-124; sats 96-97% on RA    Exercises     Assessment/Plan    PT Assessment Patient needs continued PT services  PT Problem List Decreased strength;Decreased activity tolerance;Decreased balance;Decreased mobility;Decreased cognition;Decreased knowledge of use of DME;Decreased safety awareness;Cardiopulmonary status limiting activity       PT Treatment Interventions DME instruction;Gait training;Functional mobility training;Therapeutic activities;Therapeutic exercise;Balance training;Cognitive remediation;Patient/family education    PT Goals (Current goals can be found in the Care Plan section)  Acute Rehab PT Goals Patient Stated Goal: learn to use his rollator PT Goal Formulation: With patient Time For Goal Achievement: 02/02/24 Potential to Achieve Goals: Good    Frequency Min 2X/week     Co-evaluation               AM-PAC PT 6 Clicks Mobility  Outcome Measure Help needed turning from your back to your side while in a flat bed without using bedrails?: None Help needed moving from lying on your back to sitting on the side of a flat bed without using bedrails?: None Help needed moving to and from a bed to a chair (including a wheelchair)?: A  Little Help needed standing up from a chair using your arms (e.g., wheelchair or bedside chair)?: A Little Help needed to walk in hospital room?: A Little Help needed climbing 3-5 steps with a railing? : A Little 6 Click Score: 20    End of Session Equipment Utilized During Treatment: Gait belt Activity Tolerance: Treatment limited secondary to medical complications (Comment) (elevated HR; MD ordered EKG and returned to room for EKG) Patient left: in bed;with call bell/phone within reach;with nursing/sitter in room Nurse Communication: Mobility status;Other (comment) (on RA) PT Visit Diagnosis: Unsteadiness on feet (R26.81);Repeated falls (R29.6)    Time: 9147-9084 PT Time Calculation (min) (ACUTE ONLY): 23 min   Charges:   PT Evaluation $PT Eval Low Complexity: 1 Low   PT General Charges $$ ACUTE PT VISIT: 1 Visit          Macario RAMAN, PT Acute Rehabilitation Services  Office [phone]   Macario SHAUNNA Soja 01/19/2024, 9:36 AM

## 2024-01-19 NOTE — Progress Notes (Signed)
 Troponin downtrending, CT suggestive of primary pulmonary process.  Cocaine  positive.   Overall, not suggestive of ACS. Will review AM EKG, otherwise cardiology will follow as needed while in hospital.   Soyla DELENA Merck, MD

## 2024-01-19 NOTE — Evaluation (Signed)
 Occupational Therapy Evaluation Patient Details Name: [name] MRN: [id number] DOB: [birth year] Today's Date: 01/19/2024   History of Present Illness   63 y.o. male presents to [facility] hospital on 01/18/24 due to shortness of breath. COPD exacerbation; BiPAP;  PMH includes PE/DVT on Eliquis , NSTEMI, CHF, HTN, COPD, Hepatitis C, liver fibrosis, psoriasis.     Clinical Impressions Pt currently at min assist level for simulated LB selfcare, toileting, and grooming tasks in standing without use of an assistive device.  HR elevated from low 100s in sitting up to 130 BPM with mobility in the hallway.  Noted several dry coughing spells during session which cardiologist noted when they came in during session.  Prior to admission pt lived alone and has history of multiple falls.  He has no consistent assist at this time.  Feel he will benefit from acute care OT to help increase overall ADL independence and endurance in order to reach a safer level of function.  Recommend post acute OT inpatient follow up therapy, <3 hours/day to progress to a independent/modified independent level for home.        If plan is discharge home, recommend the following:   A little help with walking and/or transfers;Assistance with cooking/housework;A little help with bathing/dressing/bathroom;Assist for transportation     Functional Status Assessment   Patient has had a recent decline in their functional status and demonstrates the ability to make significant improvements in function in a reasonable and predictable amount of time.     Equipment Recommendations   Other (comment) (TBD next venue of care)      Precautions/Restrictions   Precautions Precautions: Fall Recall of Precautions/Restrictions: Intact Precaution/Restrictions Comments: 8+ falls in 6 months Restrictions Weight Bearing Restrictions Per Provider Order: No     Mobility Bed Mobility Overal bed mobility: Modified Independent              General bed mobility comments: HOB elevated 35 degrees    Transfers Overall transfer level: Needs assistance Equipment used: None Transfers: Sit to/from Stand, Bed to chair/wheelchair/BSC Sit to Stand: Min assist     Step pivot transfers: Min assist     General transfer comment: Pt stood from elevated EOB secondary to stating that was what he had at home.  Reports difficulty from lower surfaces, especially if they do not have arm rests.      Balance Overall balance assessment: Needs assistance Sitting-balance support: Feet supported Sitting balance-Leahy Scale: Good     Standing balance support: During functional activity, No upper extremity supported Standing balance-Leahy Scale: Fair                             ADL either performed or assessed with clinical judgement   ADL Overall ADL's : Needs assistance/impaired Eating/Feeding: Independent;Sitting   Grooming: Contact guard assist;Standing   Upper Body Bathing: Set up;Sitting   Lower Body Bathing: Minimal assistance;Sit to/from stand   Upper Body Dressing : Supervision/safety;Sitting   Lower Body Dressing: Minimal assistance;Sit to/from stand   Toilet Transfer: Minimal assistance;Ambulation   Toileting- Clothing Manipulation and Hygiene: Minimal assistance;Sit to/from stand       Functional mobility during ADLs: Minimal assistance (ambulation without assistive device) General ADL Comments: Pt currently at min assist witthout use of an assistive device for mobility.  HR ranging from 106 BPM up to 130 with ambulation in the hallway.  Pt with multiple episodes of unproductive coughing noted.  Cardiologist in during session  and aware.  Pt lives alone with history of multiple falls, is in agreement with pursuing short term rehab prior to home.     Vision Baseline Vision/History: 1 Wears glasses Ability to See in Adequate Light: 0 Adequate Patient Visual Report: No change from  baseline Vision Assessment?: Wears glasses for reading;Wears glasses for driving     Perception Perception: Within Functional Limits       Praxis Praxis: WFL       Pertinent Vitals/Pain Pain Assessment Pain Assessment: No/denies pain     Extremity/Trunk Assessment Upper Extremity Assessment Upper Extremity Assessment: Overall WFL for tasks assessed   Lower Extremity Assessment Lower Extremity Assessment: Defer to PT evaluation   Cervical / Trunk Assessment Cervical / Trunk Assessment: Kyphotic   Communication Communication Communication: No apparent difficulties   Cognition Arousal: Alert Behavior During Therapy: WFL for tasks assessed/performed Cognition: No family/caregiver present to determine baseline                               Following commands: Intact       Cueing  General Comments   Cueing Techniques: Verbal cues              Home Living Family/patient expects to be discharged to:: Private residence Living Arrangements: Alone Available Help at Discharge: Neighbor Type of Home: Apartment Home Access: Elevator     Home Layout: One level     Bathroom Shower/Tub: Chief Strategy Officer: Standard     Home Equipment: Rollator (4 wheels);Grab bars - toilet;Grab bars - tub/shower   Additional Comments: pt got rollator in past week; has barely used      Prior Functioning/Environment Prior Level of Function : Independent/Modified Independent;History of Falls (last six months) (8+ falls in 6 mos)             Mobility Comments: reports falls inside and outside; reports fall trying to get up off toilet      OT Problem List: Decreased strength;Decreased activity tolerance;Impaired balance (sitting and/or standing);Cardiopulmonary status limiting activity   OT Treatment/Interventions: Self-care/ADL training;Patient/family education;Balance training;Therapeutic activities;DME and/or AE instruction      OT  Goals(Current goals can be found in the care plan section)   Acute Rehab OT Goals Patient Stated Goal: Pt did not state this session but agreeable to participate in OT session OT Goal Formulation: With patient Time For Goal Achievement: 02/02/24 Potential to Achieve Goals: Good   OT Frequency:  Min 1X/week       AM-PAC OT 6 Clicks Daily Activity     Outcome Measure Help from another person eating meals?: None Help from another person taking care of personal grooming?: A Little Help from another person toileting, which includes using toliet, bedpan, or urinal?: A Little Help from another person bathing (including washing, rinsing, drying)?: A Little Help from another person to put on and taking off regular upper body clothing?: A Little Help from another person to put on and taking off regular lower body clothing?: A Little 6 Click Score: 19   End of Session Equipment Utilized During Treatment: Gait belt Nurse Communication: Mobility status  Activity Tolerance: Patient limited by fatigue Patient left: in bed;with call bell/phone within reach  OT Visit Diagnosis: Unsteadiness on feet (R26.81);Repeated falls (R29.6);Muscle weakness (generalized) (M62.81)                Time: 1550-1630 OT Time Calculation (min): 40 min Charges:  OT General Charges $OT Visit: 1 Visit OT Evaluation $OT Eval Moderate Complexity: 1 Mod OT Treatments $Self Care/Home Management : 23-37 mins  Lynwood Constant, OTR/L Acute Rehabilitation Services  Office [phone] 01/19/2024

## 2024-01-19 NOTE — Progress Notes (Signed)
 hsTroponin returned 128, downtrending from prior. Pt remains chest pain free. Plan as outlined in note. Confirmed with Dr. Acharya, continue Eliquis  for now. F/u EKG ordered for AM.

## 2024-01-19 NOTE — Progress Notes (Signed)
 Progress Note    [name]   FMW:[id number]  DOB: 02/23/1961  DOA: 01/18/2024     1 PCP: [name] Clam, MD  Initial CC: Shortness of breath  Hospital Course: [name] is a 63 year old male with PMH COPD, ongoing tobacco use, asthma, CAD, chronic diastolic CHF, HTN, HLD, osteoarthritis who presented with significant shortness of breath.  He has had associated cough but no production of sputum due to difficulty moving adequate air.  He has felt hot and sweaty but unsure if he has had fevers. He was admitted for COPD exacerbation.   Assessment/Plan:  Elevated troponin, suspect demand ischemia -Troponin uptrending with some chest pain which may be due to dyspnea and COPD exacerbation - given CP persistence with uptrending troponin, will ask for cardiology input as well - EKG repeated this am too showing new TWI diffusely  - he reported to cardiology today he also used cocaine  recently; follow up UDS - last echo 06/28/22: EF 40-45%, Gr 1 DD - last LHC 01/11/23, stable two-vessel CAD at that time -Morphine  for air hunger and chest pain - trend trop to peak  - follow up repeat echo   Hyperkalemia - was hypoK on admission; likely over-repleted - EKG this morning noted with prominent TWI and prolonged QTC - given lasix  and lokelma; repeat BMP this afternoon and further treatment as necessary  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   Generalized weakness PT OT assessment. Fall precautions   Chronic combined diastolic and systolic CHF Last 2D echo done on 06/28/2022 revealed LVEF 40 to 45% and grade 1 diastolic dysfunction. Euvolemic on exam Monitor strict I's and O's and daily weight  Hypophosphatemia - Replete as needed  Interval History:  Still having some CP this am.  Relieved yesterday with morphine .  Asking for more this morning.  Breathing appears comfortable however.   Antimicrobials: Azithromycin  01/18/2024 >> 10/25  DVT prophylaxis:   apixaban  (ELIQUIS ) tablet 5 mg   Code Status:   Code Status: Full Code  Mobility Assessment (Last 72 Hours)     Mobility Assessment     Row Name 01/19/24 1031 01/19/24 0924 01/18/24 2000 01/18/24 1535     Does the patient have exclusion criteria? No - Perform mobility assessment -- No - Perform mobility assessment No - Perform mobility assessment    What is the highest level of mobility based on the mobility assessment? Level 4 (Ambulates with assistance) - Balance while stepping forward/back - Complete Level 4 (Ambulates with assistance) - Balance while stepping forward/back - Complete Level 4 (Ambulates with assistance) - Balance while stepping forward/back - Complete Level 4 (Ambulates with assistance) - Balance while stepping forward/back - Complete       Barriers to discharge: None Disposition Plan: Home HH orders placed: N/A Status is: Inpatient  Objective: Blood pressure 125/78, pulse 92, temperature 98.7 F (37.1 C), temperature source Oral, resp. rate 20, height 6' 2 (1.88 m), weight 69.5 kg, SpO2 97%.  Examination:  Physical Exam Constitutional:      General: He is not in acute distress.    Appearance: Normal appearance.  HENT:     Head: Normocephalic and atraumatic.     Mouth/Throat:     Mouth: Mucous membranes are moist.  Eyes:     Extraocular Movements: Extraocular movements intact.  Cardiovascular:     Rate and  Rhythm: Normal rate and regular rhythm.  Pulmonary:     Effort: Pulmonary effort is normal. No respiratory distress.     Breath sounds: Decreased air movement (Improved compared to admission) present. Wheezing (Diffuse; some improvement) present.  Abdominal:     General: Bowel sounds are normal. There is no distension.     Palpations: Abdomen is soft.     Tenderness: There is no abdominal tenderness.  Musculoskeletal:        General: Normal range of motion.     Cervical back: Normal range of motion and neck supple.  Skin:    General: Skin is warm and dry.  Neurological:     General: No focal deficit present.     Mental Status: He is alert.  Psychiatric:        Mood and Affect: Mood normal.        Behavior: Behavior normal.      Consultants:  Cardiology  Procedures:    Data Reviewed: Results for orders placed or performed during the hospital encounter of 01/18/24 (from the past 24 hours)  Troponin I (High Sensitivity)     Status: Abnormal   Collection Time: 01/18/24  4:16 PM  Result Value Ref Range   Troponin I (High Sensitivity) 200 (HH) <18 ng/L  Troponin I (High Sensitivity)     Status: Abnormal   Collection Time: 01/18/24  7:10 PM  Result Value Ref Range   Troponin I (High Sensitivity) 262 (HH) <18 ng/L  CBC     Status: Abnormal   Collection Time: 01/19/24  3:04 AM  Result Value Ref Range   WBC 5.1 4.0 - 10.5 K/uL   RBC 3.93 (L) 4.22 - 5.81 MIL/uL   Hemoglobin 12.8 (L) 13.0 - 17.0 g/dL   HCT 61.3 (L) 60.9 - 47.9 %   MCV 98.2 80.0 - 100.0 fL   MCH 32.6 26.0 - 34.0 pg   MCHC 33.2 30.0 - 36.0 g/dL   RDW 85.6 88.4 - 84.4 %   Platelets 122 (L) 150 - 400 K/uL   nRBC 0.0 0.0 - 0.2 %  Basic metabolic panel     Status: Abnormal   Collection Time: 01/19/24  3:04 AM  Result Value Ref Range   Sodium 131 (L) 135 - 145 mmol/L   Potassium 6.0 (H) 3.5 - 5.1 mmol/L   Chloride 102 98 - 111 mmol/L   CO2 21 (L) 22 - 32 mmol/L   Glucose, Bld 141 (H) 70 - 99 mg/dL   BUN 15 8 -  23 mg/dL   Creatinine, Ser 9.28 0.61 - 1.24 mg/dL   Calcium  8.8 (L) 8.9 - 10.3 mg/dL   GFR, Estimated >39 >39 mL/min   Anion gap 8 5 - 15  Magnesium      Status: None   Collection Time: 01/19/24  3:04 AM  Result Value Ref Range   Magnesium  2.2 1.7 - 2.4 mg/dL  Phosphorus     Status: Abnormal   Collection Time: 01/19/24  3:04 AM  Result Value Ref Range   Phosphorus 2.2 (L) 2.5 - 4.6 mg/dL  Troponin I (High Sensitivity)  Status: Abnormal   Collection Time: 01/19/24  3:04 AM  Result Value Ref Range   Troponin I (High Sensitivity) 207 (HH) <18 ng/L    I have reviewed pertinent nursing notes, vitals, labs, and images as necessary. I have ordered labwork to follow up on as indicated.  I have reviewed the last notes from staff over past 24 hours. I have discussed patient's care plan and test results with nursing staff, CM/SW, and other staff as appropriate.  Old records reviewed in assessment of this patient  Time spent: Greater than 50% of the 55 minute visit was spent in counseling/coordination of care for the patient as laid out in the A&P.   LOS: 1 day   Alm Apo, MD [facility] 01/19/2024, 3:44 PM

## 2024-01-19 NOTE — Consult Note (Addendum)
 Cardiology Consultation   Patient ID: [name] MRN: [id number]; DOB: [birth year]  Admit date: 01/18/2024 Date of Consult: 01/19/2024  PCP:  Delbert Clam, MD   [facility] Providers Cardiologist:  Soyla DELENA Merck, MD        Patient Profile: [name] is a 63 y.o. male with a hx of CAD s/p BMS to D1 in 2015 (noted to be chronically occluded by cath in 2016 with residual disease treated medically), PE/DVT s/p IVC filter placement in 2012 on chronic anticoagulation, COPD, chronic HFmrEF, hypertension, hyperlipidemia, polyclonal gammopathy, chronic hepatitis C, lumbar radiculopathy, cocaine  use, alcohol abuse, tobacco use, frequent falls who is being seen 01/19/2024 for the evaluation of chest pain at the request of Dr. Patsy.  History of Present Illness: [name]  follows with Dr. Acharya and Hao Meng PA-C with the above history.  Last echo 06/2022 showed limited windows, EF 40-45%, mild LVH, G1DD, borderline dilated ascending aorta. Last ischemic evaluation for chest tigthness and dyspnea 12/2022 (following nuc) showed known occluded diagonal within the old stent filling faintly from L-L colalterals unchanged from prior cath, 20% o-prox Cx, 30% pRCA, 30% m-dRCA, 50% p-mLAD, 50% ramus managed medically. The patient reports a hx of 9 prior DVTs an1 PE, s/p IVC filter placed in 2012, unclear if ever removed, subsequently maintained on chronic Eliquis  for which he has not missed any doses.  He reports that he has been doing well recently except 3-4 weeks ago had an similar episode to this admission with SOB, chest tightness and wheezing that lasted for several days, ultimately improved with nebs so did not get checked out. Since that time he does report he has used cocaine  for the first time in a long time. He has also been drinking 5 nights per week, more than usual because it's football season, reporting about 40oz per beer plus about 3 shots of liquor.   The  last few days he began developing worsening SOB and wheezing. The day of admission he also had about 12 hours of chest tightness. EMS was called. He was found to be hypoxic on RA with tachypnea and wheezing. CXR NAD. He received IV steroids, magnesium , epi, CPAP, nebs, and morphine  - reports chest tightness resolved with morphine . He was also subsequently placed on abx. Initial CMET looked severely deranged with questionable accuracy with K 2.0, calcium  5.8, albumin <1.5 with f/u istat with K 4.1, Cr 0.70, normal ionized calcium , BNP 45.8, H/H OK, plt 141 -> he did get potassium replacement based on initial labs, with f/u BMET today showing Na 131, K 6.0 s/p Lokelma and 40mg  dose of IV Lasix . hsTroponins 11->34->83->200->262->207 last checked 3:04am. Repeat EKG this morning showed new deep anterior TWI as well as prolonged QT interval. He is now on RA satting 96%. He reports significant improvement in dyspnea though continues to have slight wheezing on auscultation. He has no residual chest pain. No edema, orthopnea, or syncope.  Review of VS shows slow gradual reduction in weight, previously 220 range in 2022, down to 163lb in 11/2023, here at 153lb.  Past Medical History:  Diagnosis Date   Asthma    CAD (coronary artery disease)    a. 09/2013 NSTEMI/PCI: LM nl, LAD 40-50%, D1 100 (2.25 x 28 Vision BMS), LCX min irregs, RI 60-70, 30, RCA 40-50/50-21ms/p, EF 45-50%.  b. cath 12/2014 -occulded BMS in diag, 50% pro to mid LAD, 60% ramus, 30% RCA    Chest pain 08/2015   Chronic heart failure  with preserved ejection fraction (HFpEF) (HCC)    a. 09/2014 EF 45-50% by LV gram;  b. 01/2014 Echo: EF 55-60%, Gr 1 DD.   Cocaine  abuse (HCC)    COPD (chronic obstructive pulmonary disease) (HCC)    DVT (deep venous thrombosis) (HCC)    I had ~ 10 in each leg (12/20/2016)   Essential hypertension    Hepatitis C    clear free over a year now   High cholesterol    Myocardial infarction [facility]) ~ 2014    Osteoarthritis    a. hands and toes.   Pneumonia    several times (12/20/2016)   Pulmonary embolism (HCC)    a. 2012 - s/p IVC filter;  b. prev on eliquis  - noncompliant.   Shortness of breath dyspnea    Sinus headache    Squamous cell skin cancer 07/13/2016   foot   Tobacco abuse     Past Surgical History:  Procedure Laterality Date   BACK SURGERY  2022   CARDIAC CATHETERIZATION N/A 01/04/2015   Procedure: Left Heart Cath and Coronary Angiography;  Surgeon: Lonni JONETTA Cash, MD;  Location: [facility] INVASIVE CV LAB;  Service: Cardiovascular;  Laterality: N/A;   CORONARY ANGIOPLASTY WITH STENT PLACEMENT  10/21/2013   BMS to D1   CYST EXCISION N/A 05/10/2016   Procedure: EXCISION OF SEBACEOUS CYST UPPER BACK;  Surgeon: Donnice Lima, MD;  Location: [facility] OR;  Service: General;  Laterality: N/A;   FOOT SURGERY Right ~ 06/2016   said there was skin cancer on it   LEFT HEART CATH AND CORONARY ANGIOGRAPHY N/A 01/11/2023   Procedure: LEFT HEART CATH AND CORONARY ANGIOGRAPHY;  Surgeon: Cash Lonni JONETTA, MD;  Location: [facility] INVASIVE CV LAB;  Service: Cardiovascular;  Laterality: N/A;   LEFT HEART CATHETERIZATION WITH CORONARY ANGIOGRAM N/A 10/21/2013   Procedure: LEFT HEART CATHETERIZATION WITH CORONARY ANGIOGRAM;  Surgeon: Alm LELON Clay, MD;  Location: [facility] CATH LAB;  Service: Cardiovascular;  Laterality: N/A;   PAROTIDECTOMY Left 03/19/2023   Procedure: PAROTIDECTOMY;  Surgeon: Jesus Oliphant, MD;  Location: [facility] OR;  Service: ENT;  Laterality: Left;   VENA CAVA FILTER PLACEMENT  ~ 2007-~ 2017   1 in my neck; 2 in my wrist     Home Medications:  Prior to Admission medications   Medication Sig Start Date End Date Taking? Authorizing Provider  albuterol  (PROAIR  HFA) 108 (90 Base) MCG/ACT inhaler Inhale 2 puffs into the lungs every 6 (six) hours as needed for wheezing or shortness of breath. 04/19/23  Yes Delbert Clam, MD  albuterol  (PROVENTIL ) (2.5 MG/3ML) 0.083% nebulizer solution  Take 3 mLs (2.5 mg total) by nebulization every 6 (six) hours as needed for wheezing or shortness of breath. 04/19/23  Yes Newlin, Enobong, MD  apixaban  (ELIQUIS ) 5 MG TABS tablet Take 1 tablet (5 mg total) by mouth 2 (two) times daily. 04/19/23  Yes Newlin, Enobong, MD  Azelastine -Fluticasone  137-50 MCG/ACT SUSP Place 2 sprays into the nose every 12 (twelve) hours. 04/19/23  Yes Newlin, Enobong, MD  buPROPion  (WELLBUTRIN  XL) 150 MG 24 hr tablet Take 1 tablet (150 mg total) by mouth daily. For smoking cessation. 04/19/23  Yes Newlin, Enobong, MD  cetirizine  (ZYRTEC ) 10 MG tablet Take 1 tablet (10 mg total) by mouth daily. 06/07/22  Yes Marinda Rocky SAILOR, MD  empagliflozin  (JARDIANCE ) 10 MG TABS tablet Take 1 tablet (10 mg total) by mouth daily before breakfast. 04/19/23  Yes Newlin, Enobong, MD  Evolocumab  140 MG/ML SOAJ Inject 140 mg into  the skin every 14 (fourteen) days. 12/28/22  Yes Meng, Hao, PA  ezetimibe  (ZETIA ) 10 MG tablet Take 1 tablet (10 mg total) by mouth daily. 04/19/23  Yes Newlin, Enobong, MD  Fluticasone -Umeclidin-Vilant (TRELEGY ELLIPTA ) 100-62.5-25 MCG/ACT AEPB Inhale 1 puff into the lungs daily. 12/27/23  Yes Newlin, Enobong, MD  furosemide  (LASIX ) 40 MG tablet Take 1 tablet (40 mg total) by mouth daily as needed (May take 40mg  Daily for Swelling and Shortness of breath). 06/14/22  Yes Acharya, Gayatri A, MD  gabapentin  (NEURONTIN ) 100 MG capsule Take 1 capsule (100 mg total) by mouth at bedtime. 04/19/23  Yes Newlin, Enobong, MD  losartan  (COZAAR ) 25 MG tablet Take 1 tablet (25 mg total) by mouth daily. 04/19/23  Yes Newlin, Enobong, MD  methocarbamol  (ROBAXIN ) 500 MG tablet Take 1 tablet (500 mg total) by mouth every 8 (eight) hours as needed for muscle spasms. NEEDS MAILED 12/10/23  Yes Fleming, Zelda W, NP  metoprolol  succinate (TOPROL -XL) 25 MG 24 hr tablet Take 1 tablet (25 mg total) by mouth daily. 04/19/23  Yes Newlin, Enobong, MD  montelukast  (SINGULAIR ) 10 MG tablet Take 1 tablet (10 mg  total) by mouth at bedtime. 04/19/23  Yes Newlin, Enobong, MD  pantoprazole  (PROTONIX ) 40 MG tablet Take 1 tablet (40 mg total) by mouth daily. 04/19/23  Yes Newlin, Enobong, MD  potassium chloride  SA (KLOR-CON  M) 20 MEQ tablet Take 1 tablet (20 mEq total) by mouth daily. 08/31/23  Yes Newlin, Enobong, MD  isosorbide  mononitrate (IMDUR ) 30 MG 24 hr tablet Take 1 tablet (30 mg total) by mouth daily. Patient not taking: Reported on 01/18/2024 06/14/22   Acharya, Gayatri A, MD  Misc. Devices MISC Rolling walker with seat.  Diagnosis-recurrent falls 07/27/23   Newlin, Enobong, MD  nitroGLYCERIN  (NITROSTAT ) 0.4 MG SL tablet Place 1 tablet (0.4 mg total) under the tongue every 5 (five) minutes as needed for chest pain. Patient not taking: Reported on 01/18/2024 06/14/22 09/26/24  Acharya, Gayatri A, MD    Scheduled Meds:  apixaban   5 mg Oral BID   arformoterol   15 mcg Nebulization BID   budesonide  (PULMICORT ) nebulizer solution  0.25 mg Nebulization BID   ezetimibe   10 mg Oral Daily   gabapentin   100 mg Oral QHS   methylPREDNISolone  (SOLU-MEDROL ) injection  40 mg Intravenous Daily   montelukast   10 mg Oral QHS   Continuous Infusions:  sodium PHOSPHATE  IVPB (in mmol) 30 mmol (01/19/24 0953)   PRN Meds: acetaminophen , guaiFENesin -dextromethorphan , ipratropium-albuterol , morphine  injection, polyethylene glycol, prochlorperazine  Allergies:    Allergies  Allergen Reactions   Atorvastatin      myalgia    Social History:   Social History   Socioeconomic History   Marital status: Single    Spouse name: Not on file   Number of children: Not on file   Years of education: Not on file   Highest education level: Not on file  Occupational History   Occupation: unemployed  Tobacco Use   Smoking status: Every Day    Current packs/day: 1.00    Average packs/day: 1 pack/day for 35.0 years (35.0 ttl pk-yrs)    Types: Cigarettes    Passive exposure: Never   Smokeless tobacco: Never   Tobacco comments:     Up to 5 cigarettes a day.   Vaping Use   Vaping status: Never Used  Substance and Sexual Activity   Alcohol use: Yes    Alcohol/week: 6.0 standard drinks of alcohol    Types: 2 Cans of beer,  4 Shots of liquor per week   Drug use: Not Currently    Types: Cocaine     Comment: 12/20/2016 might have used some the other day; I'm not sure   Sexual activity: Not Currently  Other Topics Concern   Not on file  Social History Narrative   Lives in [place].   Social Drivers of Corporate Investment Banker Strain: Low Risk  (06/21/2022)   Overall Financial Resource Strain (CARDIA)    Difficulty of Paying Living Expenses: Not hard at all  Food Insecurity: Food Insecurity Present (01/18/2024)   Hunger Vital Sign    Worried About Running Out of Food in the Last Year: Sometimes true    Ran Out of Food in the Last Year: Sometimes true  Transportation Needs: Unmet Transportation Needs (01/18/2024)   PRAPARE - Administrator, Civil Service (Medical): Yes    Lack of Transportation (Non-Medical): No  Physical Activity: Inactive (06/21/2022)   Exercise Vital Sign    Days of Exercise per Week: 0 days    Minutes of Exercise per Session: 0 min  Stress: No Stress Concern Present (06/21/2022)   [employer] of Occupational Health - Occupational Stress Questionnaire    Feeling of Stress : Not at all  Social Connections: Not on file  Intimate Partner Violence: Not At Risk (01/18/2024)   Humiliation, Afraid, Rape, and Kick questionnaire    Fear of Current or Ex-Partner: No    Emotionally Abused: No    Physically Abused: No    Sexually Abused: No    Family History:   Family History  Problem Relation Age of Onset   Asthma Mother    Allergies Mother    Allergies Sister    Allergies Brother    Deep vein thrombosis Brother        two brothers with recurrent DVT   Heart attack Father        a.60s b. deceased in his 87s   Coronary artery disease Father    Colon cancer Neg Hx    Liver  cancer Neg Hx    Esophageal cancer Neg Hx    Colon polyps Neg Hx    Rectal cancer Neg Hx    Pancreatic cancer Neg Hx    Stomach cancer Neg Hx      ROS:  Please see the history of present illness.  All other ROS reviewed and negative.     Physical Exam/Data: Vitals:   01/19/24 0404 01/19/24 0843 01/19/24 0845 01/19/24 0900  BP: 106/70   123/86  Pulse: 100   97  Resp: 20   20  Temp: 98 F (36.7 C)   98 F (36.7 C)  TempSrc: Oral   Oral  SpO2: 98% 99% 100% 98%  Weight: 69.5 kg     Height:        Intake/Output Summary (Last 24 hours) at 01/19/2024 1450 Last data filed at 01/19/2024 0300 Gross per 24 hour  Intake 120 ml  Output 800 ml  Net -680 ml      01/19/2024    4:04 AM 01/18/2024    3:35 PM 01/18/2024    3:44 AM  Last 3 Weights  Weight (lbs) 153 lb 3.5 oz 149 lb 4 oz 165 lb  Weight (kg) 69.5 kg 67.7 kg 74.844 kg     Body mass index is 19.67 kg/m.  General: Well developed, well nourished, in no acute distress. Head: Normocephalic, atraumatic, sclera non-icteric, no xanthomas, nares are without discharge. Neck: Negative  for carotid bruits. JVP not elevated. Lungs: Diffusely rhonchorous with raspy cough, diffuse expiratory wheezing. Breathing is unlabored. Heart: RRR S1 S2 without murmurs, rubs, or gallops.  Abdomen: Soft, non-tender, non-distended with normoactive bowel sounds. No rebound/guarding. Extremities: No clubbing or cyanosis. No edema. Distal pedal pulses are 2+ and equal bilaterally. Neuro: Alert and oriented X 3. Moves all extremities spontaneously. Psych:  Responds to questions appropriately with a normal affect.  EKG:  The EKG was personally reviewed and demonstrates:    Yesterday:  -First: borderline sinus tach 97bpm frequent PVCs, prior anteroseptal infarct, nonspecific TWI I, avL - Second: sinus tach 101bpm, RAE, prior anteroseptal infarct, baseline wander, nonspecific STTW changes I, avL, V5-V6  Today: - NSR 96bpm, marked anterior TWI  V2-V6, continued TWI avL, baseline tremor/artifact, prolonged QTC - F/u just now appears similar to earlier today, QTc  Telemetry:  Telemetry was personally reviewed and demonstrates:  NSR  Relevant CV Studies:  Echo 06/2022  1. Limited study due to poor echo windows.   2. Left ventricular ejection fraction, by estimation, is 40 to 45%. The  left ventricle has mildly decreased function. Left ventricular endocardial  border not optimally defined to evaluate regional wall motion. There is  mild concentric left ventricular  hypertrophy. Left ventricular diastolic parameters are consistent with  Grade I diastolic dysfunction (impaired relaxation).   3. Right ventricular systolic function is normal. The right ventricular  size is normal. Tricuspid regurgitation signal is inadequate for assessing  PA pressure.   4. The mitral valve is grossly normal. No evidence of mitral valve  regurgitation. No evidence of mitral stenosis.   5. The aortic valve is tricuspid. There is mild calcification of the  aortic valve. There is mild thickening of the aortic valve. Aortic valve  regurgitation is trivial. Aortic valve sclerosis/calcification is present,  without any evidence of aortic  stenosis.   6. The inferior vena cava is normal in size with greater than 50%  respiratory variability, suggesting right atrial pressure of 3 mmHg.   7. Aortic dilatation noted. There is borderline dilatation of the aortic  root, measuring 39 mm.  Comparison(s): No significant change from prior study.   Cath 12/2022    Ost Cx to Prox Cx lesion is 20% stenosed.   Prox RCA lesion is 30% stenosed.   Mid RCA to Dist RCA lesion is 30% stenosed.   Ost 1st Diag to 1st Diag lesion is 100% stenosed.   Prox LAD to Mid LAD lesion is 50% stenosed.   Ramus lesion is 50% stenosed.   1st Diag lesion is 100% stenosed.  Stable two vessel CAD Large caliber LAD that courses to the apex. The proximal to mid LAD has a long  calcified segment with moderate, non-obstructive stenosis. Unchanged from last cath.  The Diagonal branch is occluded within the old stent and fills faintly from left to left collaterals. This is unchanged from last cath The moderate caliber intermediate branch has moderate, non-obstructive proximal stenosis The Circumflex is small to moderate in caliber and has minimal plaque The RCA is a large dominant vessel with mild proximal and mild distal stenosis  Recommendations: Continue medical management of CAD.   Laboratory Data: High Sensitivity Troponin:   Recent Labs  Lab 01/18/24 0527 01/18/24 0734 01/18/24 1616 01/18/24 1910 01/19/24 0304  TROPONINIHS 34* 83* 200* 262* 207*     Chemistry Recent Labs  Lab 01/18/24 0356 01/18/24 0416 01/18/24 0545 01/18/24 0708 01/19/24 0304  NA 134* 140 141  --  131*  K 2.0* 3.7 4.1  --  6.0*  CL 113*  --  108  --  102  CO2 13*  --   --   --  21*  GLUCOSE 103*  --  150*  --  141*  BUN <5*  --  10  --  15  CREATININE 0.42*  --  0.70  --  0.71  CALCIUM  5.8*  --   --   --  8.8*  MG  --   --   --  2.4 2.2  GFRNONAA >60  --   --   --  >60  ANIONGAP 8  --   --   --  8    Recent Labs  Lab 01/18/24 0356  PROT 4.2*  ALBUMIN <1.5*  AST 18  ALT 13  ALKPHOS 41  BILITOT 0.5   Lipids No results for input(s): CHOL, TRIG, HDL, LABVLDL, LDLCALC, CHOLHDL in the last 168 hours.  Hematology Recent Labs  Lab 01/18/24 0356 01/18/24 0416 01/18/24 0545 01/19/24 0304  WBC 5.3  --   --  5.1  RBC 4.35  --   --  3.93*  HGB 14.1 14.6 15.6 12.8*  HCT 44.0 43.0 46.0 38.6*  MCV 101.1*  --   --  98.2  MCH 32.4  --   --  32.6  MCHC 32.0  --   --  33.2  RDW 14.6  --   --  14.3  PLT 141*  --   --  122*   Thyroid  No results for input(s): TSH, FREET4 in the last 168 hours.  BNP Recent Labs  Lab 01/18/24 0327  BNP 45.8    DDimer No results for input(s): DDIMER in the last 168 hours.  Radiology/Studies:  DG Chest Portable 1  View Result Date: 01/18/2024 EXAM: 1 VIEW(S) XRAY OF THE CHEST 01/18/2024 04:03:40 AM COMPARISON: 05/22/23 CLINICAL HISTORY: eval for sob FINDINGS: LUNGS AND PLEURA: Unchanged appearance of diffuse chronic bronchitic changes. No focal pulmonary opacity. No pulmonary edema. No pleural effusion. No pneumothorax. HEART AND MEDIASTINUM: Calcified aorta. No acute abnormality of the cardiac and mediastinal silhouettes. BONES AND SOFT TISSUES: Thoracic degenerative changes. No acute osseous abnormality. IMPRESSION: 1. No acute cardiopulmonary abnormality. Electronically signed by: Waddell Calk MD 01/18/2024 05:21 AM EDT RP Workstation: [id number]     Assessment and Plan:  1. Acute hypoxic respiratory failure in setting of COPD exacerbation - primary team managing, improving with supportive care - CTA also pending given sinus tach, hypoxia, hx recurrent DVT/PE - check respiratory panels  2. Elevated troponin, known coronary artery disease, abnormal EKG with TWI and prolonged QT interval in setting of hyperkalemia - elevated troponin relatively low peak at 262 in setting of physiologic demand - with underlying coronary artery disease, suspect demand ischemia. However, EKG this morning showed development of anterior TWI with prolonged QTC. Unclear if this is related to metabolic changes of hyperkalemia or concomitant ACS/stress induced ischemia. Note also recent polysubstance abuse likely contributing to presentation - will obtain f/u troponin now - > if still downtrending, hold off IV heparin  per d/w Acharya but if trending up again, can stop Eliquis  and start IV heparin  x at least 48 hours along with addition of aspirin  - mgmt of hyperkalemia per primary team as below - plan 2D echo - avoid BB given recent cocaine  use - continue Zetia  - on outpatient Repatha  - lipid panel in AM - patient currently chest pain free but should notify for recurrent chest pain -  f/u EKG in AM  3. Chronic HFmrEF - CXR  unrevealing, BNP low (non-obese), and no edema on exam therefore suspect primary driver of dyspnea was pulmonary in etiology, also with recent downtrending weight - avoid beta blocker due to wheezing/cocaine  use - hold off on resuming ARB and SGLT2i given hyperkalemia - suspect due to KCl repletion so may revisit once lytes normalize - resume Imdur   4. Polysubstance abuse with tobacco, cocaine , alcohol use - patient reports recent use of all of the above - notified primary team in case they wanted to add CIWA - will add UDS for competeness - cessation advised - he has made strides in cutting down tobacco use over time and states that cocaine  use was the first time many years  5. History of recurrent DVT/PE - CTA pending to exclude PE   6. Hyperkalemia - initial K 2.0 on ER CMET but suspect spurious value, did receive potassium replacement with f/u K 6.0 this AM - avoid ACEI/ARB/ARNI/spiro for now - management per primary team - received Lokelma and dose of IV Lasix  - touched base with Dr. Patsy re: QT prolongation, he is following up stat BMET  7. Weight loss - gradual weight loss 220->153lb over last several years - check TSH  Risk Assessment/Risk Scores:     TIMI Risk Score for Unstable Angina or Non-ST Elevation MI:   The patient's TIMI risk score is  , which indicates a  % risk of all cause mortality, new or recurrent myocardial infarction or need for urgent revascularization in the next 14 days.  [place]  Heart Association (NYHA) Functional Class NYHA Class III     For questions or updates, please contact [facility] Please consult [email or web address] for contact info under      Signed, Nga Rabon N Liviana Mills, PA-C  01/19/2024 2:50 PM

## 2024-01-20 ENCOUNTER — Inpatient Hospital Stay (HOSPITAL_COMMUNITY)

## 2024-01-20 DIAGNOSIS — I249 Acute ischemic heart disease, unspecified: Secondary | ICD-10-CM

## 2024-01-20 DIAGNOSIS — I2489 Other forms of acute ischemic heart disease: Secondary | ICD-10-CM | POA: Diagnosis not present

## 2024-01-20 DIAGNOSIS — J441 Chronic obstructive pulmonary disease with (acute) exacerbation: Secondary | ICD-10-CM | POA: Diagnosis not present

## 2024-01-20 DIAGNOSIS — R9431 Abnormal electrocardiogram [ECG] [EKG]: Secondary | ICD-10-CM

## 2024-01-20 DIAGNOSIS — R7989 Other specified abnormal findings of blood chemistry: Secondary | ICD-10-CM | POA: Diagnosis not present

## 2024-01-20 LAB — CBC
HCT: 37 % — ABNORMAL LOW (ref 39.0–52.0)
Hemoglobin: 12.4 g/dL — ABNORMAL LOW (ref 13.0–17.0)
MCH: 32.8 pg (ref 26.0–34.0)
MCHC: 33.5 g/dL (ref 30.0–36.0)
MCV: 97.9 fL (ref 80.0–100.0)
Platelets: 124 K/uL — ABNORMAL LOW (ref 150–400)
RBC: 3.78 MIL/uL — ABNORMAL LOW (ref 4.22–5.81)
RDW: 14.6 % (ref 11.5–15.5)
WBC: 5.1 K/uL (ref 4.0–10.5)
nRBC: 0 % (ref 0.0–0.2)

## 2024-01-20 LAB — COMPREHENSIVE METABOLIC PANEL WITH GFR
ALT: 21 U/L (ref 0–44)
AST: 28 U/L (ref 15–41)
Albumin: 2.6 g/dL — ABNORMAL LOW (ref 3.5–5.0)
Alkaline Phosphatase: 68 U/L (ref 38–126)
Anion gap: 9 (ref 5–15)
BUN: 27 mg/dL — ABNORMAL HIGH (ref 8–23)
CO2: 25 mmol/L (ref 22–32)
Calcium: 8.7 mg/dL — ABNORMAL LOW (ref 8.9–10.3)
Chloride: 102 mmol/L (ref 98–111)
Creatinine, Ser: 0.95 mg/dL (ref 0.61–1.24)
GFR, Estimated: >60 mL/min (ref >60)
Glucose, Bld: 139 mg/dL — ABNORMAL HIGH (ref 70–99)
Potassium: 4.9 mmol/L (ref 3.5–5.1)
Sodium: 136 mmol/L (ref 135–145)
Total Bilirubin: 0.4 mg/dL (ref 0.0–1.2)
Total Protein: 6.5 g/dL (ref 6.5–8.1)

## 2024-01-20 LAB — ECHOCARDIOGRAM COMPLETE
AR max vel: 2.2 cm2
AV Peak grad: 8.8 mmHg
Ao pk vel: 1.48 m/s
Area-P 1/2: 4.6 cm2
Height: 74 in
S' Lateral: 2.7 cm
Weight: 2464 [oz_av]

## 2024-01-20 LAB — LIPID PANEL
Cholesterol: 124 mg/dL (ref 0–200)
HDL: 56 mg/dL (ref >40)
LDL Cholesterol: 57 mg/dL (ref 0–99)
Total CHOL/HDL Ratio: 2.2 ratio
Triglycerides: 53 mg/dL (ref <150)
VLDL: 11 mg/dL (ref 0–40)

## 2024-01-20 MED ORDER — IPRATROPIUM-ALBUTEROL 0.5-2.5 (3) MG/3ML IN SOLN
3.0000 mL | RESPIRATORY_TRACT | Status: DC | PRN
  Administered 2024-01-21: 3 mL via RESPIRATORY_TRACT
  Filled 2024-01-20: qty 3

## 2024-01-20 NOTE — Progress Notes (Signed)
 Echocardiogram 2D Echocardiogram has been performed.  [name] 01/20/2024, 2:01 PM

## 2024-01-20 NOTE — Progress Notes (Signed)
 Progress Note    [name]   FMW:[id number]  DOB: Apr 29, 1960  DOA: 01/18/2024     2 PCP: Delbert Clam, MD  Initial CC: Shortness of breath  Hospital Course: [name] is a 63 year old male with PMH COPD, ongoing tobacco use, asthma, CAD, chronic diastolic CHF, HTN, HLD, osteoarthritis who presented with significant shortness of breath.  He has had associated cough but no production of sputum due to difficulty moving adequate air.  He has felt hot and sweaty but unsure if he has had fevers. He was admitted for COPD exacerbation.   Assessment/Plan:  Elevated troponin, suspect demand ischemia -Troponin uptrending with some chest pain which may be due to dyspnea and COPD exacerbation - He later admitted to cocaine  use.  UDS positive for cocaine  on 10/25 -Cardiology following, greatly appreciate assistance.  EKG changes persist with TWI; also prolonged QTc persistent despite correction of electrolytes - follow up echo  - last echo 06/28/22: EF 40-45%, Gr 1 DD - last LHC 01/11/23, stable two-vessel CAD at that time - Morphine  for air hunger and chest pain  Hyperkalemia - was hypoK on admission; likely over-repleted - s/p lokelma and lasix  - K now normal this am  History of recurrent PE/DVT on Eliquis  - Resumed home Eliquis  -CTA chest obtained 10/25 negative for PE with some motion artifact however  Acute COPD/asthma exacerbation - Current smoker and coming down on tobacco use -Given prolonged QTc, modifying regimen as well - d/c Brovana , change duoneb to PRN only - IV Solu-Medrol    Acute hypoxic respiratory failure - resolved  -Not on oxygen  at home -Required BiPAP on admission due to increased WOB - remains off bipap - back to RA now   Tobacco use disorder Cutting down on tobacco use Counseled on the importance of tobacco cessation   Generalized weakness PT OT assessment. Fall precautions   Chronic combined diastolic and systolic CHF Last 2D echo done on  06/28/2022 revealed LVEF 40 to 45% and grade 1 diastolic dysfunction. Euvolemic on exam Monitor strict I's and O's and daily weight  Hypophosphatemia - Replete as needed  Interval History:  CP resolved this am. On RA and breathing better but still not back to baseline.    Antimicrobials: Azithromycin  01/18/2024 >> 10/25  DVT prophylaxis:   apixaban  (ELIQUIS ) tablet 5 mg   Code Status:   Code Status: Full Code  Mobility Assessment (Last 72 Hours)     Mobility Assessment     Row Name 01/19/24 1945 01/19/24 1652 01/19/24 1031 01/19/24 0924 01/18/24 2000   Does the patient have exclusion criteria? No - Perform mobility assessment -- No - Perform mobility assessment -- No - Perform mobility assessment   What is the highest level of mobility based on the mobility assessment? Level 4 (Ambulates with assistance) - Balance while stepping forward/back - Complete Level 4 (Ambulates with assistance) - Balance while stepping forward/back - Complete Level 4 (Ambulates with assistance) - Balance while stepping forward/back - Complete Level 4 (Ambulates with assistance) - Balance while stepping forward/back - Complete Level 4 (Ambulates with assistance) - Balance while stepping forward/back - Complete    Row Name 01/18/24 1535           Does the patient have exclusion criteria? No - Perform mobility assessment       What is the highest level of mobility based on the mobility assessment? Level 4 (Ambulates with assistance) - Balance while stepping forward/back - Complete  Barriers to discharge: None Disposition Plan: Home HH orders placed: N/A Status is: Inpatient  Objective: Blood pressure 115/72, pulse 83, temperature 97.6 F (36.4 C), temperature source Oral, resp. rate 20, height 6' 2 (1.88 m), weight 69.9 kg, SpO2 94%.  Examination:  Physical Exam Constitutional:      General: He is not in acute distress.    Appearance: Normal appearance.  HENT:     Head: Normocephalic  and atraumatic.     Mouth/Throat:     Mouth: Mucous membranes are moist.  Eyes:     Extraocular Movements: Extraocular movements intact.  Cardiovascular:     Rate and Rhythm: Normal rate and regular rhythm.  Pulmonary:     Effort: Pulmonary effort is normal. No respiratory distress.     Breath sounds: Decreased air movement (Improved compared to admission) present. No wheezing (t).     Comments: Diffuse coarse breath sounds bilaterally Abdominal:     General: Bowel sounds are normal. There is no distension.     Palpations: Abdomen is soft.     Tenderness: There is no abdominal tenderness.  Musculoskeletal:        General: Normal range of motion.     Cervical back: Normal range of motion and neck supple.  Skin:    General: Skin is warm and dry.  Neurological:     General: No focal deficit present.     Mental Status: He is alert.  Psychiatric:        Mood and Affect: Mood normal.        Behavior: Behavior normal.      Consultants:  Cardiology  Procedures:    Data Reviewed: Results for orders placed or performed during the hospital encounter of 01/18/24 (from the past 24 hours)  Rapid urine drug screen (hospital performed)     Status: Abnormal   Collection Time: 01/19/24  3:13 PM  Result Value Ref Range   Opiates POSITIVE (A) NONE DETECTED   Cocaine  POSITIVE (A) NONE DETECTED   Benzodiazepines NONE DETECTED NONE DETECTED   Amphetamines NONE DETECTED NONE DETECTED   Tetrahydrocannabinol NONE DETECTED NONE DETECTED   Barbiturates NONE DETECTED NONE DETECTED  Basic metabolic panel     Status: Abnormal   Collection Time: 01/19/24  3:50 PM  Result Value Ref Range   Sodium 137 135 - 145 mmol/L   Potassium 5.3 (H) 3.5 - 5.1 mmol/L   Chloride 106 98 - 111 mmol/L   CO2 21 (L) 22 - 32 mmol/L   Glucose, Bld 152 (H) 70 - 99 mg/dL   BUN 18 8 - 23 mg/dL   Creatinine, Ser 9.03 0.61 - 1.24 mg/dL   Calcium  9.0 8.9 - 10.3 mg/dL   GFR, Estimated >39 >39 mL/min   Anion gap 10 5 -  15  TSH     Status: None   Collection Time: 01/19/24  3:50 PM  Result Value Ref Range   TSH 0.886 0.350 - 4.500 uIU/mL  Troponin I (High Sensitivity)     Status: Abnormal   Collection Time: 01/19/24  3:50 PM  Result Value Ref Range   Troponin I (High Sensitivity) 128 (HH) <18 ng/L  SARS Coronavirus 2 by RT PCR (hospital order, performed in [facility] hospital lab) *cepheid single result test* Urine, Clean Catch     Status: None   Collection Time: 01/19/24  4:26 PM   Specimen: Urine, Clean Catch; Nasal Swab  Result Value Ref Range   SARS Coronavirus 2 by RT PCR NEGATIVE  NEGATIVE  Respiratory (~20 pathogens) panel by PCR     Status: None   Collection Time: 01/19/24  4:27 PM   Specimen: Nasopharyngeal Swab; Respiratory  Result Value Ref Range   Adenovirus NOT DETECTED NOT DETECTED   Coronavirus 229E NOT DETECTED NOT DETECTED   Coronavirus HKU1 NOT DETECTED NOT DETECTED   Coronavirus NL63 NOT DETECTED NOT DETECTED   Coronavirus OC43 NOT DETECTED NOT DETECTED   Metapneumovirus NOT DETECTED NOT DETECTED   Rhinovirus / Enterovirus NOT DETECTED NOT DETECTED   Influenza A NOT DETECTED NOT DETECTED   Influenza B NOT DETECTED NOT DETECTED   Parainfluenza Virus 1 NOT DETECTED NOT DETECTED   Parainfluenza Virus 2 NOT DETECTED NOT DETECTED   Parainfluenza Virus 3 NOT DETECTED NOT DETECTED   Parainfluenza Virus 4 NOT DETECTED NOT DETECTED   Respiratory Syncytial Virus NOT DETECTED NOT DETECTED   Bordetella pertussis NOT DETECTED NOT DETECTED   Bordetella Parapertussis NOT DETECTED NOT DETECTED   Chlamydophila pneumoniae NOT DETECTED NOT DETECTED   Mycoplasma pneumoniae NOT DETECTED NOT DETECTED  Comprehensive metabolic panel     Status: Abnormal   Collection Time: 01/20/24  1:54 AM  Result Value Ref Range   Sodium 136 135 - 145 mmol/L   Potassium 4.9 3.5 - 5.1 mmol/L   Chloride 102 98 - 111 mmol/L   CO2 25 22 - 32 mmol/L   Glucose, Bld 139 (H) 70 - 99 mg/dL   BUN 27 (H) 8 - 23 mg/dL    Creatinine, Ser 9.04 0.61 - 1.24 mg/dL   Calcium  8.7 (L) 8.9 - 10.3 mg/dL   Total Protein 6.5 6.5 - 8.1 g/dL   Albumin 2.6 (L) 3.5 - 5.0 g/dL   AST 28 15 - 41 U/L   ALT 21 0 - 44 U/L   Alkaline Phosphatase 68 38 - 126 U/L   Total Bilirubin 0.4 0.0 - 1.2 mg/dL   GFR, Estimated >39 >39 mL/min   Anion gap 9 5 - 15  CBC     Status: Abnormal   Collection Time: 01/20/24  1:54 AM  Result Value Ref Range   WBC 5.1 4.0 - 10.5 K/uL   RBC 3.78 (L) 4.22 - 5.81 MIL/uL   Hemoglobin 12.4 (L) 13.0 - 17.0 g/dL   HCT 62.9 (L) 60.9 - 47.9 %   MCV 97.9 80.0 - 100.0 fL   MCH 32.8 26.0 - 34.0 pg   MCHC 33.5 30.0 - 36.0 g/dL   RDW 85.3 88.4 - 84.4 %   Platelets 124 (L) 150 - 400 K/uL   nRBC 0.0 0.0 - 0.2 %  Lipid panel     Status: None   Collection Time: 01/20/24  1:54 AM  Result Value Ref Range   Cholesterol 124 0 - 200 mg/dL   Triglycerides 53 <849 mg/dL   HDL 56 >59 mg/dL   Total CHOL/HDL Ratio 2.2 RATIO   VLDL 11 0 - 40 mg/dL   LDL Cholesterol 57 0 - 99 mg/dL    I have reviewed pertinent nursing notes, vitals, labs, and images as necessary. I have ordered labwork to follow up on as indicated.  I have reviewed the last notes from staff over past 24 hours. I have discussed patient's care plan and test results with nursing staff, CM/SW, and other staff as appropriate.  Old records reviewed in assessment of this patient  Time spent: Greater than 50% of the 55 minute visit was spent in counseling/coordination of care for the patient as laid out  in the A&P.   LOS: 2 days   Alm Apo, MD [facility] 01/20/2024, 11:54 AM

## 2024-01-20 NOTE — Progress Notes (Addendum)
 EKG reviewed with Dr. Loni, similar to yesterday with continued anterior T wave inversions, subtle rounded inferior ST segments with TWI, and persistent QT prolongation. K OK. Mg has been consistently wnl this admission. Patient is chest pain free this AM and feeling much better. Dr. Acharya recommends to continue plan as previously outlined and plan echo, ? possible Takotsubo related to recent drug use. If LVEF has declined further, may need to consider cath this admission basedon clinical course. Avoid QT prolonging agents. Primary team will review inhaler regimen and peel back where able (Brovana  and Duoneb both carry caution with QT prolongation).

## 2024-01-21 ENCOUNTER — Other Ambulatory Visit: Payer: Self-pay

## 2024-01-21 ENCOUNTER — Other Ambulatory Visit: Payer: Self-pay | Admitting: Family Medicine

## 2024-01-21 ENCOUNTER — Other Ambulatory Visit (HOSPITAL_COMMUNITY): Payer: Self-pay

## 2024-01-21 DIAGNOSIS — I5042 Chronic combined systolic (congestive) and diastolic (congestive) heart failure: Secondary | ICD-10-CM

## 2024-01-21 DIAGNOSIS — I10 Essential (primary) hypertension: Secondary | ICD-10-CM

## 2024-01-21 DIAGNOSIS — J441 Chronic obstructive pulmonary disease with (acute) exacerbation: Secondary | ICD-10-CM | POA: Diagnosis not present

## 2024-01-21 DIAGNOSIS — I2489 Other forms of acute ischemic heart disease: Secondary | ICD-10-CM | POA: Diagnosis not present

## 2024-01-21 LAB — BASIC METABOLIC PANEL WITH GFR
Anion gap: 8 (ref 5–15)
BUN: 19 mg/dL (ref 8–23)
CO2: 23 mmol/L (ref 22–32)
Calcium: 8.8 mg/dL — ABNORMAL LOW (ref 8.9–10.3)
Chloride: 103 mmol/L (ref 98–111)
Creatinine, Ser: 0.71 mg/dL (ref 0.61–1.24)
GFR, Estimated: >60 mL/min (ref >60)
Glucose, Bld: 140 mg/dL — ABNORMAL HIGH (ref 70–99)
Potassium: 3.8 mmol/L (ref 3.5–5.1)
Sodium: 134 mmol/L — ABNORMAL LOW (ref 135–145)

## 2024-01-21 LAB — CBC
HCT: 35.5 % — ABNORMAL LOW (ref 39.0–52.0)
Hemoglobin: 11.8 g/dL — ABNORMAL LOW (ref 13.0–17.0)
MCH: 32.7 pg (ref 26.0–34.0)
MCHC: 33.2 g/dL (ref 30.0–36.0)
MCV: 98.3 fL (ref 80.0–100.0)
Platelets: 125 K/uL — ABNORMAL LOW (ref 150–400)
RBC: 3.61 MIL/uL — ABNORMAL LOW (ref 4.22–5.81)
RDW: 14 % (ref 11.5–15.5)
WBC: 4.2 K/uL (ref 4.0–10.5)
nRBC: 0 % (ref 0.0–0.2)

## 2024-01-21 LAB — MAGNESIUM: Magnesium: 1.9 mg/dL (ref 1.7–2.4)

## 2024-01-21 MED ORDER — ALUM & MAG HYDROXIDE-SIMETH 200-200-20 MG/5ML PO SUSP
30.0000 mL | Freq: Four times a day (QID) | ORAL | Status: DC | PRN
  Administered 2024-01-21: 30 mL via ORAL
  Filled 2024-01-21: qty 30

## 2024-01-21 NOTE — Discharge Instructions (Signed)

## 2024-01-21 NOTE — Telephone Encounter (Signed)
 Tried to contact pt and unable to leave msg 1st attempt by hf on phone [phone]

## 2024-01-21 NOTE — Plan of Care (Signed)

## 2024-01-21 NOTE — Progress Notes (Addendum)
 Rounding Note   Patient Name: [name] Date of Encounter: 01/21/2024  [facility] Cardiologist: Soyla DELENA Merck, MD   Subjective Continues to wheeze and feel short of breath.   Scheduled Meds:  apixaban   5 mg Oral BID   budesonide  (PULMICORT ) nebulizer solution  0.25 mg Nebulization BID   ezetimibe   10 mg Oral Daily   gabapentin   100 mg Oral QHS   isosorbide  mononitrate  30 mg Oral Daily   methylPREDNISolone  (SOLU-MEDROL ) injection  40 mg Intravenous Daily   montelukast   10 mg Oral QHS   Continuous Infusions:  PRN Meds: acetaminophen , alum & mag hydroxide-simeth, guaiFENesin -dextromethorphan , ipratropium-albuterol , morphine  injection, polyethylene glycol   Vital Signs  Vitals:   01/21/24 0011 01/21/24 0439 01/21/24 0737 01/21/24 0745  BP: 139/86 128/81  (!) 142/88  Pulse: 83 70 68 86  Resp: 18 13  17   Temp: 98 F (36.7 C) 98 F (36.7 C)  97.6 F (36.4 C)  TempSrc: Oral Oral  Oral  SpO2: 95% 94% 95% 98%  Weight:  (P) 70.8 kg    Height:        Intake/Output Summary (Last 24 hours) at 01/21/2024 0902 Last data filed at 01/21/2024 0400 Gross per 24 hour  Intake 720 ml  Output 1950 ml  Net -1230 ml      01/21/2024    4:39 AM 01/20/2024    4:53 AM 01/19/2024    4:04 AM  Last 3 Weights  Weight (lbs) 156 lb 1.6 oz 154 lb 153 lb 3.5 oz  Weight (kg) 70.806 kg 69.854 kg 69.5 kg      Telemetry Sinus rhythm. - Personally Reviewed  ECG  Sinus rhythm.  Rate 83 bpm.  Anterolateral TWI - Personally Reviewed  Physical Exam  VS:  BP (!) 142/88 (BP Location: Left Arm)   Pulse 86   Temp 97.6 F (36.4 C) (Oral)   Resp 17   Ht 6' 2 (1.88 m)   Wt (P) 70.8 kg   SpO2 98%   BMI (P) 20.04 kg/m  , BMI Body mass index is 20.04 kg/m (pended). GENERAL:  Well appearing HEENT: Pupils equal round and reactive, fundi not visualized, oral mucosa unremarkable NECK:  No jugular venous distention, waveform within normal limits, carotid upstroke brisk  and symmetric, no bruits, no thyromegaly LUNGS:  Coarse breath sounds.  Diffuse expiratory wheezing.  HEART:  RRR.  PMI not displaced or sustained,S1 and S2 within normal limits, no S3, no S4, no clicks, no rubs, no murmurs ABD:  Flat, positive bowel sounds normal in frequency in pitch, no bruits, no rebound, no guarding, no midline pulsatile mass, no hepatomegaly, no splenomegaly EXT:  2 plus pulses throughout, no edema, no cyanosis no clubbing SKIN:  No rashes no nodules NEURO:  Cranial nerves II through XII grossly intact, motor grossly intact throughout PSYCH:  Cognitively intact, oriented to person place and time   Labs High Sensitivity Troponin:   Recent Labs  Lab 01/18/24 0734 01/18/24 1616 01/18/24 1910 01/19/24 0304 01/19/24 1550  TROPONINIHS 83* 200* 262* 207* 128*     Chemistry Recent Labs  Lab 01/18/24 0356 01/18/24 0416 01/18/24 0708 01/19/24 0304 01/19/24 1550 01/20/24 0154 01/21/24 0205  NA 134*   < >  --  131* 137 136 134*  K 2.0*   < >  --  6.0* 5.3* 4.9 3.8  CL 113*   < >  --  102 106 102 103  CO2 13*  --   --  21* 21* 25 23  GLUCOSE 103*   < >  --  141* 152* 139* 140*  BUN <5*   < >  --  15 18 27* 19  CREATININE 0.42*   < >  --  0.71 0.96 0.95 0.71  CALCIUM  5.8*  --   --  8.8* 9.0 8.7* 8.8*  MG  --   --  2.4 2.2  --   --  1.9  PROT 4.2*  --   --   --   --  6.5  --   ALBUMIN <1.5*  --   --   --   --  2.6*  --   AST 18  --   --   --   --  28  --   ALT 13  --   --   --   --  21  --   ALKPHOS 41  --   --   --   --  68  --   BILITOT 0.5  --   --   --   --  0.4  --   GFRNONAA >60  --   --  >60 >60 >60 >60  ANIONGAP 8  --   --  8 10 9 8    < > = values in this interval not displayed.    Lipids  Recent Labs  Lab 01/20/24 0154  CHOL 124  TRIG 53  HDL 56  LDLCALC 57  CHOLHDL 2.2    Hematology Recent Labs  Lab 01/19/24 0304 01/20/24 0154 01/21/24 0205  WBC 5.1 5.1 4.2  RBC 3.93* 3.78* 3.61*  HGB 12.8* 12.4* 11.8*  HCT 38.6* 37.0* 35.5*  MCV  98.2 97.9 98.3  MCH 32.6 32.8 32.7  MCHC 33.2 33.5 33.2  RDW 14.3 14.6 14.0  PLT 122* 124* 125*   Thyroid   Recent Labs  Lab 01/19/24 1550  TSH 0.886    BNP Recent Labs  Lab 01/18/24 0327  BNP 45.8    DDimer No results for input(s): DDIMER in the last 168 hours.   Radiology  ECHOCARDIOGRAM COMPLETE Result Date: 01/20/2024    ECHOCARDIOGRAM REPORT   Patient Name:   MCCLAIN SHALL Date of Exam: 01/20/2024 Medical Rec #:  [id number]         Height:       74.0 in Accession #:    [id number]        Weight:       154.0 lb Date of Birth:  [birth year]         BSA:          1.945 m Patient Age:    63 years          BP:           115/72 mmHg Patient Gender: M                 HR:           98 bpm. Exam Location:  Inpatient Procedure: 2D Echo, Cardiac Doppler and Color Doppler (Both Spectral and Color            Flow Doppler were utilized during procedure). Indications:    Elevated troponin  History:        Patient has prior history of Echocardiogram examinations. CHF;                 Signs/Symptoms:Shortness of Breath.  Sonographer:    Thea Norlander Referring Phys: DAYNA N DUNN IMPRESSIONS  1.  Left ventricular ejection fraction, by estimation, is 50 to 55%. The left ventricle has low normal function. Left ventricular endocardial border not optimally defined to evaluate regional wall motion. Left ventricular diastolic function could not be evaluated.  2. Right ventricular systolic function is normal. The right ventricular size is normal. Tricuspid regurgitation signal is inadequate for assessing PA pressure.  3. The mitral valve was not well visualized. No evidence of mitral valve regurgitation.  4. The aortic valve is tricuspid. Aortic valve regurgitation is not visualized. No aortic stenosis is present.  5. There is mild dilatation of the ascending aorta, measuring 40 mm.  6. The inferior vena cava is dilated in size with >50% respiratory variability, suggesting right atrial pressure of 8 mmHg.  Comparison(s): Changes from prior study are noted. The left ventricular function has improved. FINDINGS  Left Ventricle: Left ventricular ejection fraction, by estimation, is 50 to 55%. The left ventricle has low normal function. Left ventricular endocardial border not optimally defined to evaluate regional wall motion. The left ventricular internal cavity  size was normal in size. There is no left ventricular hypertrophy. Left ventricular diastolic function could not be evaluated due to nondiagnostic images. Left ventricular diastolic function could not be evaluated. Right Ventricle: The right ventricular size is normal. No increase in right ventricular wall thickness. Right ventricular systolic function is normal. Tricuspid regurgitation signal is inadequate for assessing PA pressure. Left Atrium: Left atrial size was normal in size. Right Atrium: Right atrial size was normal in size. Pericardium: There is no evidence of pericardial effusion. Mitral Valve: The mitral valve was not well visualized. No evidence of mitral valve regurgitation. Tricuspid Valve: The tricuspid valve is grossly normal. Tricuspid valve regurgitation is not demonstrated. No evidence of tricuspid stenosis. Aortic Valve: The aortic valve is tricuspid. Aortic valve regurgitation is not visualized. No aortic stenosis is present. Aortic valve peak gradient measures 8.8 mmHg. Pulmonic Valve: The pulmonic valve was grossly normal. Pulmonic valve regurgitation is not visualized. Aorta: The aortic root is normal in size and structure. There is mild dilatation of the ascending aorta, measuring 40 mm. Venous: The inferior vena cava is dilated in size with greater than 50% respiratory variability, suggesting right atrial pressure of 8 mmHg. IAS/Shunts: The atrial septum is grossly normal.  LEFT VENTRICLE PLAX 2D LVIDd:         4.50 cm LVIDs:         2.70 cm LV PW:         1.10 cm LV IVS:        0.80 cm LVOT diam:     2.30 cm LV SV:         61 LV SV  Index:   31 LVOT Area:     4.15 cm  RIGHT VENTRICLE            IVC RV S prime:     9.46 cm/s  IVC diam: 2.40 cm LEFT ATRIUM           Index        RIGHT ATRIUM          Index LA diam:      2.70 cm 1.39 cm/m   RA Area:     8.28 cm LA Vol (A4C): 20.0 ml 10.28 ml/m  RA Volume:   17.50 ml 9.00 ml/m  AORTIC VALVE AV Area (Vmax): 2.20 cm AV Vmax:        148.00 cm/s AV Peak Grad:   8.8 mmHg LVOT Vmax:  78.25 cm/s LVOT Vmean:     53.650 cm/s LVOT VTI:       0.147 m  AORTA Ao Root diam: 3.90 cm Ao Asc diam:  4.00 cm MITRAL VALVE MV Area (PHT): 4.60 cm    SHUNTS MV Decel Time: 165 msec    Systemic VTI:  0.15 m MV E velocity: 48.20 cm/s  Systemic Diam: 2.30 cm MV A velocity: 62.90 cm/s MV E/A ratio:  0.77 Darryle Decent MD Electronically signed by Darryle Decent MD Signature Date/Time: 01/20/2024/4:13:28 PM    Final    CT Angio Chest Pulmonary Embolism (PE) W or WO Contrast Result Date: 01/19/2024 CLINICAL DATA:  Provided history: hx PE/DVT, noncompliance, SOB/tachycardia, hypoxia EXAM: CT ANGIOGRAPHY CHEST WITH CONTRAST TECHNIQUE: Multidetector CT imaging of the chest was performed using the standard protocol during bolus administration of intravenous contrast. Multiplanar CT image reconstructions and MIPs were obtained to evaluate the vascular anatomy. RADIATION DOSE REDUCTION: This exam was performed according to the departmental dose-optimization program which includes automated exposure control, adjustment of the mA and/or kV according to patient size and/or use of iterative reconstruction technique. CONTRAST:  75mL OMNIPAQUE  IOHEXOL  350 MG/ML SOLN COMPARISON:  Lung cancer screening chest CT 08/02/2023 FINDINGS: Cardiovascular: Motion artifact limitations. Allowing for this, no filling defects in the pulmonary arteries to suggest pulmonary embolus. The heart is normal in size. There are coronary artery calcifications. Aortic atherosclerosis and tortuosity without aortic aneurysm. No acute aortic findings. No  pericardial effusion. Mediastinum/Nodes: No enlarged mediastinal lymph nodes. No hilar adenopathy. Stable 12 mm left axillary node. Breathing motion artifact limits detailed parenchymal assessment. No thyroid  nodule. Small amount of fluid in the mid esophagus without wall thickening. Lungs/Pleura: Emphysema. Severe bronchial thickening with areas of mucoid impaction and bronchial occlusion. Probable mucoid impaction in the left upper lobe versus airway nodule, series 6, image 62. Layering debris/mucus within the mid trachea.Faint tree-in-bud opacity in the right upper lobe, series 6, image 39. Upper Abdomen: Moderate gastric distention with ingested material. No acute upper abdominal findings. Musculoskeletal: Diffuse thoracic spondylosis. Sclerosis about the left anterior sixth rib was not seen on prior exam and may represent healing fracture. Review of the MIP images confirms the above findings. IMPRESSION: 1. No pulmonary embolus allowing for motion artifact limitations. 2. Severe bronchial thickening with areas of mucoid impaction and bronchial occlusion. Faint tree-in-bud opacity in the right upper lobe, likely infectious or inflammatory. 3. Probable mucoid impaction in the left upper lobe versus airway nodule. Recommend attention on annual lung cancer screening exam. 4. Sclerosis about the left anterior sixth rib was not seen on prior exam and may represent healing fracture. 5. Coronary artery calcifications. Aortic Atherosclerosis (ICD10-I70.0) and Emphysema (ICD10-J43.9). Electronically Signed   By: Andrea Gasman M.D.   On: 01/19/2024 18:53    Cardiac Studies  Echo 01/20/24:  1. Left ventricular ejection fraction, by estimation, is 50 to 55%. The  left ventricle has low normal function. Left ventricular endocardial  border not optimally defined to evaluate regional wall motion. Left  ventricular diastolic function could not be  evaluated.   2. Right ventricular systolic function is normal. The  right ventricular  size is normal. Tricuspid regurgitation signal is inadequate for assessing  PA pressure.   3. The mitral valve was not well visualized. No evidence of mitral valve  regurgitation.   4. The aortic valve is tricuspid. Aortic valve regurgitation is not  visualized. No aortic stenosis is present.   5. There is mild dilatation of the ascending  aorta, measuring 40 mm.   6. The inferior vena cava is dilated in size with >50% respiratory  variability, suggesting right atrial pressure of 8 mmHg.   LHC 01/11/23:   Ost Cx to Prox Cx lesion is 20% stenosed.   Prox RCA lesion is 30% stenosed.   Mid RCA to Dist RCA lesion is 30% stenosed.   Ost 1st Diag to 1st Diag lesion is 100% stenosed.   Prox LAD to Mid LAD lesion is 50% stenosed.   Ramus lesion is 50% stenosed.   1st Diag lesion is 100% stenosed.   Stable two vessel CAD Large caliber LAD that courses to the apex. The proximal to mid LAD has a long calcified segment with moderate, non-obstructive stenosis. Unchanged from last cath.  The Diagonal branch is occluded within the old stent and fills faintly from left to left collaterals. This is unchanged from last cath The moderate caliber intermediate branch has moderate, non-obstructive proximal stenosis The Circumflex is small to moderate in caliber and has minimal plaque The RCA is a large dominant vessel with mild proximal and mild distal stenosis   Recommendations: Continue medical management of CAD.   Patient Profile   63 y.o. male with CAD s/p BMS to D1 in 2015, HFmrEF, recurrent DVT/PE s/p IVC filter, COPD, and polysubstance abuse admitted with shortness of breath and acute COPD/asthma exacerbation.  Cardiology consulted for elevated troponin.  Assessment & Plan  # Elevated troponin:  # CAD:  EKG with anterolateral TWI.  HS troponin peaked at 262 and downtrending. He has known obstructive CAD that has been medically managed.  Echo revealed improvement in LVEF to  50-55% and normal RV function, improved from 40-45%. No plan for inpatient ischemic work up at this time.  This is demand ischemia in the setting of COPD exacerbation and medically managed obstructive CAD.  Continue Imdur .  Home Jardiance  was held.  Recommend restarting prior to discharge.  Resume metoprolol  once respiratory status improves.   # Recurrent DVT: S/p IVC filter.  Continue Eliquis .   # Hyperlipidemia:  Continue Zetia  and Repatha .   LDL 57.    # QT Prolongation:  Avoid QT prolonging medications.  Maintain K>4, Mg>2.       For questions or updates, please contact [facility] Please consult [email or web address] for contact info under       Signed, Annabella Scarce, MD  01/21/2024, 9:02 AM

## 2024-01-21 NOTE — TOC Progression Note (Addendum)
 Transition of Care [facility]) - Progression Note    Patient Details  Name: [name] MRN: [id number] Date of Birth: 05-30-1960  [facility] CM/SW Contact  Waddell Barnie Rama, RN Phone Number: 01/21/2024, 11:12 AM  Clinical Narrative:    NCM was notified by CSW that patient does not want SNF he wants to go home with [facility],  he does not have a preference.  NCM sent referral to portal.  [facility] has accepted the referral for HHPT, HHOT, and SW.  Patient lives in the [place] and states he will need a bus pass at [employer].  Patient will contact [facility] when he gets home because his phone will not be on til 11/3.   Expected Discharge Plan: Home w Home Health Services Barriers to Discharge: Continued Medical Work up               Expected Discharge Plan and Services In-house Referral: Clinical Social Work Discharge Planning Services: CM Consult   Living arrangements for the past 2 months: Apartment                                       Social Drivers of Health (SDOH) Interventions SDOH Screenings   Food Insecurity: Food Insecurity Present (01/18/2024)  Housing: Low Risk  (01/18/2024)  Transportation Needs: Unmet Transportation Needs (01/18/2024)  Utilities: Not At Risk (01/18/2024)  Depression (PHQ2-9): Low Risk  (12/10/2023)  Financial Resource Strain: Low Risk  (06/21/2022)  Physical Activity: Inactive (06/21/2022)  Stress: No Stress Concern Present (06/21/2022)  Tobacco Use: High Risk (01/18/2024)    Readmission Risk Interventions     No data to display

## 2024-01-21 NOTE — Progress Notes (Signed)
 Progress Note    [name]   FMW:[id number]  DOB: Aug 08, 1960  DOA: 01/18/2024     3 PCP: Delbert Clam, MD  Initial CC: Shortness of breath  Hospital Course: [name] is a 63 year old male with PMH COPD, ongoing tobacco use, asthma, CAD, chronic diastolic CHF, HTN, HLD, osteoarthritis who presented with significant shortness of breath.  He has had associated cough but no production of sputum due to difficulty moving adequate air.  He has felt hot and sweaty but unsure if he has had fevers. He was admitted for COPD exacerbation.   Assessment/Plan:  Elevated troponin, suspect demand ischemia -Troponin uptrending with some chest pain which may be due to dyspnea and COPD exacerbation - He later admitted to cocaine  use.  UDS positive for cocaine  on 10/25 -Cardiology following, greatly appreciate assistance.  EKG changes persist with TWI; also prolonged QTc persistent despite correction of electrolytes - last echo 06/28/22: EF 40-45%, Gr 1 DD - last LHC 01/11/23, stable two-vessel CAD at that time - Morphine  for air hunger and chest pain - Echo repeated, EF actually improved, 50 to 55%.  Endocardial border not optimally defined to evaluate for WMA - No recommendations per cardiology for any further ischemic workup  Hyperkalemia -resolved - was hypoK on admission; likely over-repleted - s/p lokelma and lasix  - K now normal  History of recurrent PE/DVT on Eliquis  - Resumed home Eliquis  - Outpatient plans for possibly removing IVC filter also in place -CTA chest obtained 10/25 negative for PE with some motion artifact however  Acute COPD/asthma exacerbation - Current smoker and coming down on tobacco use -Given prolonged QTc, modifying regimen as well - d/c Brovana , change duoneb to PRN only - IV Solu-Medrol    Acute hypoxic respiratory failure - resolved  -Not on oxygen  at home -Required BiPAP on admission due to increased WOB - remains off bipap - back to RA now    Tobacco use disorder Cutting down on tobacco use Counseled on the importance of tobacco cessation   Generalized weakness PT OT assessment. Fall precautions -SNF recommended   Chronic combined diastolic and systolic CHF Last 2D echo done on 06/28/2022 revealed LVEF 40 to 45% and grade 1 diastolic dysfunction. Euvolemic on exam Monitor strict I's and O's and daily weight  Hypophosphatemia - Replete as needed  Interval History:  Chest pain remained resolved.  Doing okay otherwise and still having some wheezing on exam this morning but breathing is much more comfortable and remains on room air. Feasibly he could go home but therapy is recommending SNF so we will await workup for that.   Antimicrobials: Azithromycin  01/18/2024 >> 10/25  DVT prophylaxis:   apixaban  (ELIQUIS ) tablet 5 mg   Code Status:   Code Status: Full Code  Mobility Assessment (Last 72 Hours)     Mobility Assessment     Row Name 01/21/24 0802 01/20/24 1930 01/19/24 1945 01/19/24 1652 01/19/24 1031   Does the patient have exclusion criteria? No - Perform mobility assessment No - Perform mobility assessment No - Perform mobility assessment -- No - Perform mobility assessment   What is the highest level of mobility based on the mobility assessment? Level 4 (Ambulates with assistance) - Balance while stepping forward/back - Complete Level 4 (Ambulates with assistance) - Balance while stepping forward/back - Complete Level 4 (Ambulates with assistance) - Balance while stepping forward/back - Complete Level 4 (Ambulates with assistance) - Balance while stepping forward/back - Complete Level 4 (Ambulates with assistance) - Balance while  stepping forward/back - Complete    Row Name 01/19/24 9075 01/18/24 2000 01/18/24 1535       Does the patient have exclusion criteria? -- No - Perform mobility assessment No - Perform mobility assessment     What is the highest level of mobility based on the mobility assessment? Level  4 (Ambulates with assistance) - Balance while stepping forward/back - Complete Level 4 (Ambulates with assistance) - Balance while stepping forward/back - Complete Level 4 (Ambulates with assistance) - Balance while stepping forward/back - Complete        Barriers to discharge: None Disposition Plan: Home HH orders placed: N/A Status is: Inpatient  Objective: Blood pressure (!) 142/88, pulse 86, temperature 97.6 F (36.4 C), temperature source Oral, resp. rate 17, height 6' 2 (1.88 m), weight (P) 70.8 kg, SpO2 98%.  Examination:  Physical Exam Constitutional:      General: He is not in acute distress.    Appearance: Normal appearance.  HENT:     Head: Normocephalic and atraumatic.     Mouth/Throat:     Mouth: Mucous membranes are moist.  Eyes:     Extraocular Movements: Extraocular movements intact.  Cardiovascular:     Rate and Rhythm: Normal rate and regular rhythm.  Pulmonary:     Effort: Pulmonary effort is normal. No respiratory distress.     Breath sounds: Decreased air movement (Improved compared to admission) present. No wheezing.     Comments: Diffuse coarse breath sounds bilaterally Abdominal:     General: Bowel sounds are normal. There is no distension.     Palpations: Abdomen is soft.     Tenderness: There is no abdominal tenderness.  Musculoskeletal:        General: Normal range of motion.     Cervical back: Normal range of motion and neck supple.  Skin:    General: Skin is warm and dry.  Neurological:     General: No focal deficit present.     Mental Status: He is alert.  Psychiatric:        Mood and Affect: Mood normal.        Behavior: Behavior normal.      Consultants:  Cardiology, signed off 01/21/2024  Procedures:    Data Reviewed: Results for orders placed or performed during the hospital encounter of 01/18/24 (from the past 24 hours)  Magnesium      Status: None   Collection Time: 01/21/24  2:05 AM  Result Value Ref Range   Magnesium  1.9  1.7 - 2.4 mg/dL  Basic metabolic panel     Status: Abnormal   Collection Time: 01/21/24  2:05 AM  Result Value Ref Range   Sodium 134 (L) 135 - 145 mmol/L   Potassium 3.8 3.5 - 5.1 mmol/L   Chloride 103 98 - 111 mmol/L   CO2 23 22 - 32 mmol/L   Glucose, Bld 140 (H) 70 - 99 mg/dL   BUN 19 8 - 23 mg/dL   Creatinine, Ser 9.28 0.61 - 1.24 mg/dL   Calcium  8.8 (L) 8.9 - 10.3 mg/dL   GFR, Estimated >39 >39 mL/min   Anion gap 8 5 - 15  CBC     Status: Abnormal   Collection Time: 01/21/24  2:05 AM  Result Value Ref Range   WBC 4.2 4.0 - 10.5 K/uL   RBC 3.61 (L) 4.22 - 5.81 MIL/uL   Hemoglobin 11.8 (L) 13.0 - 17.0 g/dL   HCT 64.4 (L) 60.9 - 47.9 %   MCV  98.3 80.0 - 100.0 fL   MCH 32.7 26.0 - 34.0 pg   MCHC 33.2 30.0 - 36.0 g/dL   RDW 85.9 88.4 - 84.4 %   Platelets 125 (L) 150 - 400 K/uL   nRBC 0.0 0.0 - 0.2 %    I have reviewed pertinent nursing notes, vitals, labs, and images as necessary. I have ordered labwork to follow up on as indicated.  I have reviewed the last notes from staff over past 24 hours. I have discussed patient's care plan and test results with nursing staff, CM/SW, and other staff as appropriate.  Old records reviewed in assessment of this patient  Time spent: Greater than 50% of the 55 minute visit was spent in counseling/coordination of care for the patient as laid out in the A&P.   LOS: 3 days   Alm Apo, MD [facility] 01/21/2024, 10:40 AM

## 2024-01-21 NOTE — Telephone Encounter (Signed)
-----   Message from Eather Popp sent at 01/18/2024  4:46 PM EDT ----- Burna inform the patient that MRI scan of thoracic spine shows only minor disc wear-and-tear changes without any significant compression.  Nothing to worry about ----- Message ----- From: Joshua Maurilio CROME, RN Sent: 01/09/2024  10:43 AM EDT To: Eather GORMAN Popp, MD  Dr. Popp- you are work in this morning. Dr. Onita out until 01/21/24. Can you review? Thank you ----- Message ----- From: Popp Eather GORMAN, MD Sent: 01/09/2024  10:16 AM EDT To: Modena Onita, MD

## 2024-01-21 NOTE — TOC Initial Note (Addendum)
 Transition of Care [facility]) - Initial/Assessment Note    Patient Details  Name: [name] MRN: [id number] Date of Birth: November 28, 1960  [facility] CM/SW Contact:    Luise JAYSON Pan, LCSWA Phone Number: 01/21/2024, 11:11 AM  Clinical Narrative:  CSW spoke with patient about PT rec for SNF. Patient stated he does not want to go to SNF at this time and would like to go home with home health. CSW notified MD and RNCM.   Patient informed CSW that he has a wheelchair, BCS, and grab bars in the shower at his apartment. Patient reported that he does not have any care aids. Patient reports he lives in [place] by himself, but his neighbor assists him as needed.   CSW will continue to follow.    Expected Discharge Plan: Home w Home Health Services Barriers to Discharge: Continued Medical Work up   Patient Goals and CMS Choice Patient states their goals for this hospitalization and ongoing recovery are:: To go home          Expected Discharge Plan and Services In-house Referral: Clinical Social Work Discharge Planning Services: CM Consult   Living arrangements for the past 2 months: Apartment                                      Prior Living Arrangements/Services Living arrangements for the past 2 months: Apartment Lives with:: Self Patient language and need for interpreter reviewed:: Yes Do you feel safe going back to the place where you live?: Yes      Need for Family Participation in Patient Care: No (Comment) Care giver support system in place?: No (comment) Current home services: DME (wheelchair, grab bars, BSC) Criminal Activity/Legal Involvement Pertinent to Current Situation/Hospitalization: No - Comment as needed  Activities of Daily Living   ADL Screening (condition at time of admission) Independently performs ADLs?: Yes (appropriate for developmental age) Is the patient deaf or have difficulty hearing?: No Does the patient have  difficulty seeing, even when wearing glasses/contacts?: No Does the patient have difficulty concentrating, remembering, or making decisions?: No  Permission Sought/Granted Permission sought to share information with : Case Manager, Magazine Features Editor Permission granted to share information with : Yes, Verbal Permission Granted     Permission granted to share info w AGENCY: HH        Emotional Assessment Appearance:: Appears stated age Attitude/Demeanor/Rapport: Engaged Affect (typically observed): Pleasant Orientation: : Oriented to  Time, Oriented to Situation, Oriented to Place, Oriented to Self Alcohol / Substance Use: Not Applicable Psych Involvement: No (comment)  Admission diagnosis:  Acute exacerbation of chronic obstructive pulmonary disease (COPD) (HCC) [J44.1] COPD exacerbation (HCC) [J44.1] Patient Active Problem List   Diagnosis Date Noted   Acute exacerbation of chronic obstructive pulmonary disease (COPD) (HCC) 01/18/2024   Demand ischemia (HCC) 01/18/2024   Parotid mass 03/19/2023   Statin intolerance 12/01/2022   Seasonal allergic rhinitis due to pollen 01/25/2022   Degenerative spondylolisthesis 11/02/2020   Statin myopathy 08/17/2020   Lumbar radiculopathy 05/20/2020   Gait abnormality 03/12/2020   Chronic right-sided low back pain with right-sided sciatica 03/12/2020   Weakness 03/12/2020   Pain due to onychomycosis of toenails of both feet 01/30/2020   Hyperlipidemia 01/26/2020   GERD (gastroesophageal reflux disease) 04/24/2017   COPD exacerbation (HCC) 04/05/2017   Chronic sinusitis 02/02/2017   COPD with acute exacerbation (HCC) 12/20/2016  History of squamous cell carcinoma 08/03/2016   Psoriasis 08/03/2016   Squamous cell skin cancer 07/13/2016   Chronic anticoagulation 02/01/2016   Malnutrition of moderate degree 06/05/2015   Liver fibrosis 04/08/2015   COPD (chronic obstructive pulmonary disease) (HCC) 01/01/2015   Essential  hypertension 01/01/2015   Unstable angina (HCC) 01/01/2015   History of coronary artery disease    Cocaine  abuse (HCC) 12/01/2014   Pleuritic chest pain 12/01/2014   Chronic combined systolic (congestive) and diastolic (congestive) heart failure (HCC) 12/01/2014   QT prolongation 12/01/2014   Chronic hepatitis C without hepatic coma (HCC) 06/18/2014   HTN (hypertension) 04/27/2014   Coronary artery disease involving native coronary artery of native heart with angina pectoris    CHF (congestive heart failure) (HCC) 01/26/2014   CAD (coronary artery disease) 10/21/2013   NSTEMI (non-ST elevated myocardial infarction) (HCC) 10/21/2013   Anemia 06/20/2013   Regular alcohol consumption 06/18/2013   History of DVT (deep vein thrombosis) 02/21/2013   Thrombocytopenia 08/09/2012   Tobacco abuse 08/09/2012   History of noncompliance with medical treatment 03/24/2012   Atopic dermatitis 03/24/2012   History of pulmonary embolism 02/10/2012   PCP:  Delbert Clam, MD Pharmacy:   [facility] [address] [place] [address] Phone: [phone] Fax: [phone]  [facility] [address] [place] [address] Phone: [phone] Fax: [phone]     Social Drivers of Health (SDOH) Social History: SDOH Screenings   Food Insecurity: Food Insecurity Present (01/18/2024)  Housing: Low Risk  (01/18/2024)  Transportation Needs: Unmet Transportation Needs (01/18/2024)  Utilities: Not At Risk (01/18/2024)  Depression (PHQ2-9): Low Risk  (12/10/2023)  Financial Resource Strain: Low Risk  (06/21/2022)  Physical Activity: Inactive (06/21/2022)  Stress: No Stress Concern Present (06/21/2022)  Tobacco Use: High Risk (01/18/2024)   SDOH Interventions:     Readmission Risk Interventions     No data to display

## 2024-01-22 ENCOUNTER — Other Ambulatory Visit (HOSPITAL_COMMUNITY): Payer: Self-pay

## 2024-01-22 DIAGNOSIS — R9431 Abnormal electrocardiogram [ECG] [EKG]: Secondary | ICD-10-CM | POA: Diagnosis not present

## 2024-01-22 DIAGNOSIS — R7989 Other specified abnormal findings of blood chemistry: Secondary | ICD-10-CM | POA: Diagnosis not present

## 2024-01-22 DIAGNOSIS — J9601 Acute respiratory failure with hypoxia: Secondary | ICD-10-CM | POA: Diagnosis not present

## 2024-01-22 DIAGNOSIS — J441 Chronic obstructive pulmonary disease with (acute) exacerbation: Secondary | ICD-10-CM | POA: Diagnosis not present

## 2024-01-22 DIAGNOSIS — I2489 Other forms of acute ischemic heart disease: Secondary | ICD-10-CM | POA: Diagnosis not present

## 2024-01-22 MED ORDER — PANTOPRAZOLE SODIUM 40 MG PO TBEC
40.0000 mg | DELAYED_RELEASE_TABLET | Freq: Every day | ORAL | 0 refills | Status: AC
  Filled 2024-01-22: qty 90, 90d supply, fill #0

## 2024-01-22 MED ORDER — ALBUTEROL SULFATE HFA 108 (90 BASE) MCG/ACT IN AERS
2.0000 | INHALATION_SPRAY | Freq: Four times a day (QID) | RESPIRATORY_TRACT | 6 refills | Status: AC | PRN
  Filled 2024-01-22: qty 6.7, 25d supply, fill #0

## 2024-01-22 MED ORDER — PREDNISONE 20 MG PO TABS
40.0000 mg | ORAL_TABLET | Freq: Every day | ORAL | 0 refills | Status: AC
  Filled 2024-01-22: qty 10, 5d supply, fill #0

## 2024-01-22 MED ORDER — TRELEGY ELLIPTA 100-62.5-25 MCG/ACT IN AEPB
1.0000 | INHALATION_SPRAY | Freq: Every day | RESPIRATORY_TRACT | 6 refills | Status: AC
  Filled 2024-01-22: qty 60, 30d supply, fill #0

## 2024-01-22 NOTE — TOC Transition Note (Signed)
 Transition of Care [facility]) - Discharge Note   Patient Details  Name: [name] MRN: [id number] Date of Birth: [birth year]  [facility] CM/SW Contact:  Waddell Barnie Rama, RN Phone Number: 01/22/2024, 10:52 AM   Clinical Narrative:    For dc today, he will need a cab to get home.       Barriers to Discharge: Continued Medical Work up   Patient Goals and CMS Choice Patient states their goals for this hospitalization and ongoing recovery are:: To go home          Discharge Placement                       Discharge Plan and Services Additional resources added to the After Visit Summary for   In-house Referral: Clinical Social Work Discharge Planning Services: CM Consult                                 Social Drivers of Health (SDOH) Interventions SDOH Screenings   Food Insecurity: Food Insecurity Present (01/18/2024)  Housing: Low Risk  (01/18/2024)  Transportation Needs: Unmet Transportation Needs (01/18/2024)  Utilities: Not At Risk (01/18/2024)  Depression (PHQ2-9): Low Risk  (12/10/2023)  Financial Resource Strain: Low Risk  (06/21/2022)  Physical Activity: Inactive (06/21/2022)  Stress: No Stress Concern Present (06/21/2022)  Tobacco Use: High Risk (01/18/2024)     Readmission Risk Interventions     No data to display

## 2024-01-22 NOTE — Plan of Care (Signed)

## 2024-01-22 NOTE — Discharge Summary (Signed)
 Physician Discharge Summary   [name] FMW:[id number] DOB: 1960-12-12 DOA: 01/18/2024  PCP: Delbert Clam, [name]  Admit date: 01/18/2024 Discharge date: 01/22/2024  Admitted From: Home Disposition:  Home Discharging physician: Alm Apo, [name] Barriers to discharge: none  Recommendations at discharge: Continue chronic medical management  Patient strongly encouraged to remain off substances including cocaine    Home Health: PT, OT, SW  Discharge Condition: stable CODE STATUS: Full Diet recommendation:  Diet Orders (From admission, onward)     Start     Ordered   01/22/24 0000  Diet general        01/22/24 1037   01/18/24 1600  Diet regular Fluid consistency: Thin  Diet effective now       Question:  Fluid consistency:  Answer:  Thin   01/18/24 1559            Hospital Course: [name] is a 63 year old male with PMH COPD, ongoing tobacco use, asthma, CAD, chronic diastolic CHF, HTN, HLD, osteoarthritis who presented with significant shortness of breath.  He has had associated cough but no production of sputum due to difficulty moving adequate air.  He has felt hot and sweaty but unsure if he has had fevers. He was admitted for COPD exacerbation.   Assessment/Plan:  Elevated troponin, suspect demand ischemia -Troponin uptrending with some chest pain which may be due to dyspnea and COPD exacerbation - He later admitted to cocaine  use.  UDS positive for cocaine  on 10/25 -Cardiology following, greatly appreciate assistance.  EKG changes persist with TWI; also prolonged QTc persistent despite correction of electrolytes - last echo 06/28/22: EF 40-45%, Gr 1 DD - last LHC 01/11/23, stable two-vessel CAD at that time - Morphine  for air hunger and chest pain - Echo repeated, EF actually improved, 50 to 55%.  Endocardial border not optimally defined to evaluate for WMA - No recommendations per cardiology for any further ischemic workup  Hyperkalemia -resolved - was  hypoK on admission; likely over-repleted - s/p lokelma and lasix  - K now normal  History of recurrent PE/DVT on Eliquis  - Resumed home Eliquis  - Outpatient plans for possibly removing IVC filter also in place -CTA chest obtained 10/25 negative for PE with some motion artifact however  Acute COPD/asthma exacerbation - Current smoker and coming down on tobacco use -Given prolonged QTc, modifying regimen as well - Clinically improved with treatment - Transitioned back to home regimen at discharge - 5-day course of prednisone  continued as well  Acute hypoxic respiratory failure - resolved  -Not on oxygen  at home -Required BiPAP on admission due to increased WOB - remains off bipap - back to RA now   Tobacco use disorder Cutting down on tobacco use Counseled on the importance of tobacco cessation   Generalized weakness PT OT assessment. Fall precautions -SNF recommended; patient declined and he was arranged for home health at discharge   Chronic combined diastolic and systolic CHF Last 2D echo done on 06/28/2022 revealed LVEF 40 to 45% and grade 1 diastolic dysfunction. Euvolemic on exam Monitor strict I's and O's and daily weight  Hypophosphatemia - Repleted   The patient's acute and chronic medical conditions were treated accordingly. On day of discharge, patient was felt deemed stable for discharge. Patient/family member advised to call PCP or come back to ER if needed.   Principal Diagnosis: Acute exacerbation of chronic obstructive pulmonary disease (COPD) (HCC)  Discharge Diagnoses: Active Hospital Problems   Diagnosis Date Noted   Acute exacerbation of chronic obstructive pulmonary  disease (COPD) (HCC) 01/18/2024    Priority: 1.   Demand ischemia (HCC) 01/18/2024    Priority: 2.   QT prolongation 12/01/2014    Priority: 2.   History of pulmonary embolism 02/10/2012    Priority: 3.   Chronic combined systolic (congestive) and diastolic (congestive) heart failure  (HCC) 12/01/2014    Priority: 6.   Essential hypertension 01/01/2015   History of DVT (deep vein thrombosis) 02/21/2013   Tobacco abuse 08/09/2012    Resolved Hospital Problems   Diagnosis Date Noted Date Resolved   Acute respiratory failure with hypoxia (HCC) 01/27/2014 01/20/2024    Priority: 3.     Discharge Instructions     Diet general   Complete by: As directed    Increase activity slowly   Complete by: As directed       Allergies as of 01/22/2024       Reactions   Atorvastatin     myalgia        Medication List     TAKE these medications    albuterol  (2.5 MG/3ML) 0.083% nebulizer solution Commonly known as: PROVENTIL  Take 3 mLs (2.5 mg total) by nebulization every 6 (six) hours as needed for wheezing or shortness of breath.   albuterol  108 (90 Base) MCG/ACT inhaler Commonly known as: ProAir  HFA Inhale 2 puffs into the lungs every 6 (six) hours as needed for wheezing or shortness of breath.   Azelastine -Fluticasone  137-50 MCG/ACT Susp Place 2 sprays into the nose every 12 (twelve) hours.   buPROPion  150 MG 24 hr tablet Commonly known as: Wellbutrin  XL Take 1 tablet (150 mg total) by mouth daily. For smoking cessation.   cetirizine  10 MG tablet Commonly known as: ZYRTEC  Take 1 tablet (10 mg total) by mouth daily.   Eliquis  5 MG Tabs tablet Generic drug: apixaban  Take 1 tablet (5 mg total) by mouth 2 (two) times daily.   ezetimibe  10 MG tablet Commonly known as: ZETIA  Take 1 tablet (10 mg total) by mouth daily.   furosemide  40 MG tablet Commonly known as: LASIX  Take 1 tablet (40 mg total) by mouth daily as needed (May take 40mg  Daily for Swelling and Shortness of breath).   gabapentin  100 MG capsule Commonly known as: NEURONTIN  Take 1 capsule (100 mg total) by mouth at bedtime.   isosorbide  mononitrate 30 MG 24 hr tablet Commonly known as: IMDUR  Take 1 tablet (30 mg total) by mouth daily.   Jardiance  10 MG Tabs tablet Generic drug:  empagliflozin  Take 1 tablet (10 mg total) by mouth daily before breakfast.   losartan  25 MG tablet Commonly known as: COZAAR  Take 1 tablet (25 mg total) by mouth daily.   methocarbamol  500 MG tablet Commonly known as: ROBAXIN  Take 1 tablet (500 mg total) by mouth every 8 (eight) hours as needed for muscle spasms. NEEDS MAILED   metoprolol  succinate 25 MG 24 hr tablet Commonly known as: TOPROL -XL Take 1 tablet (25 mg total) by mouth daily.   Misc. Devices Misc Rolling walker with seat.  Diagnosis-recurrent falls   montelukast  10 MG tablet Commonly known as: Singulair  Take 1 tablet (10 mg total) by mouth at bedtime.   nitroGLYCERIN  0.4 MG SL tablet Commonly known as: NITROSTAT  Place 1 tablet (0.4 mg total) under the tongue every 5 (five) minutes as needed for chest pain.   pantoprazole  40 MG tablet Commonly known as: PROTONIX  Take 1 tablet (40 mg total) by mouth daily.   potassium chloride  SA 20 MEQ tablet Commonly known as: KLOR-CON   M Take 1 tablet (20 mEq total) by mouth daily.   predniSONE  20 MG tablet Commonly known as: DELTASONE  Take 2 tablets (40 mg total) by mouth daily with breakfast for 5 days. Start taking on: January 23, 2024   Repatha  SureClick 140 MG/ML Soaj Generic drug: Evolocumab  Inject 140 mg into the skin every 14 (fourteen) days.   Trelegy Ellipta  100-62.5-25 MCG/ACT Aepb Generic drug: Fluticasone -Umeclidin-Vilant Inhale 1 puff into the lungs daily.        Contact information for follow-up providers     Delbert Clam, [name] Follow up.   Specialty: Family Medicine Why: Please follow up in a week. Contact information: [address] [place] [address] [place] [address] [phone]              Contact information for after-discharge care     Home Medical Care     [facility]) .   Service: Home Health Services Why: Agency will call you to set up apt times Contact information: [address] [place]   [address] [phone]                    Allergies  Allergen Reactions   Atorvastatin      myalgia    Consultations: Cardiology  Procedures:   Discharge Exam: BP 138/81 (BP Location: Right Arm)   Pulse 85   Temp 98.8 F (37.1 C) (Oral)   Resp 14   Ht 6' 2 (1.88 m)   Wt 72.1 kg   SpO2 96%   BMI 20.41 kg/m  Physical Exam Constitutional:      General: He is not in acute distress.    Appearance: Normal appearance.  HENT:     Head: Normocephalic and atraumatic.     Mouth/Throat:     Mouth: Mucous membranes are moist.  Eyes:     Extraocular Movements: Extraocular movements intact.  Cardiovascular:     Rate and Rhythm: Normal rate and regular rhythm.  Pulmonary:     Effort: Pulmonary effort is normal. No respiratory distress.     Breath sounds: Decreased air movement (Improved compared to admission) present. No wheezing.     Comments: Diffuse coarse breath sounds bilaterally Abdominal:     General: Bowel sounds are normal. There is no distension.     Palpations: Abdomen is soft.     Tenderness: There is no abdominal tenderness.  Musculoskeletal:        General: Normal range of motion.     Cervical back: Normal range of motion and neck supple.  Skin:    General: Skin is warm and dry.  Neurological:     General: No focal deficit present.     Mental Status: He is alert.  Psychiatric:        Mood and Affect: Mood normal.        Behavior: Behavior normal.      The results of significant diagnostics from this hospitalization (including imaging, microbiology, ancillary and laboratory) are listed below for reference.   Microbiology: Recent Results (from the past 240 hours)  SARS Coronavirus 2 by RT PCR (hospital order, performed in [facility] hospital lab) *cepheid single result test* Urine, Clean Catch     Status: None   Collection Time: 01/19/24  4:26 PM   Specimen: Urine, Clean Catch; Nasal Swab  Result Value Ref Range  Status   SARS Coronavirus 2 by RT PCR NEGATIVE NEGATIVE Final    Comment: Performed at [facility], [address], [place], [place] [address]  Respiratory (~20 pathogens) panel by PCR     Status: None   Collection Time: 01/19/24  4:27 PM   Specimen: Nasopharyngeal Swab; Respiratory  Result Value Ref Range Status   Adenovirus NOT DETECTED NOT DETECTED Final   Coronavirus 229E NOT DETECTED NOT DETECTED Final    Comment: (NOTE) The Coronavirus on the Respiratory Panel, DOES NOT test for the novel  Coronavirus (2019 nCoV)    Coronavirus HKU1 NOT DETECTED NOT DETECTED Final   Coronavirus NL63 NOT DETECTED NOT DETECTED Final   Coronavirus OC43 NOT DETECTED NOT DETECTED Final   Metapneumovirus NOT DETECTED NOT DETECTED Final   Rhinovirus / Enterovirus NOT DETECTED NOT DETECTED Final   Influenza A NOT DETECTED NOT DETECTED Final   Influenza B NOT DETECTED NOT DETECTED Final   Parainfluenza Virus 1 NOT DETECTED NOT DETECTED Final   Parainfluenza Virus 2 NOT DETECTED NOT DETECTED Final   Parainfluenza Virus 3 NOT DETECTED NOT DETECTED Final   Parainfluenza Virus 4 NOT DETECTED NOT DETECTED Final   Respiratory Syncytial Virus NOT DETECTED NOT DETECTED Final   Bordetella pertussis NOT DETECTED NOT DETECTED Final   Bordetella Parapertussis NOT DETECTED NOT DETECTED Final   Chlamydophila pneumoniae NOT DETECTED NOT DETECTED Final   Mycoplasma pneumoniae NOT DETECTED NOT DETECTED Final    Comment: Performed at [facility], [address], [place], [place] [address]     Labs: BNP (last 3 results) Recent Labs    01/18/24 0327  BNP 45.8   Basic Metabolic Panel: Recent Labs  Lab 01/18/24 0356 01/18/24 0416 01/18/24 0545 01/18/24 0708 01/19/24 0304 01/19/24 1550 01/20/24 0154 01/21/24 0205  NA 134*   < > 141  --  131* 137 136 134*  K 2.0*   < > 4.1  --  6.0* 5.3* 4.9 3.8  CL 113*  --  108  --  102 106 102 103  CO2 13*  --   --   --  21* 21* 25 23  GLUCOSE 103*  --   150*  --  141* 152* 139* 140*  BUN <5*  --  10  --  15 18 27* 19  CREATININE 0.42*  --  0.70  --  0.71 0.96 0.95 0.71  CALCIUM  5.8*  --   --   --  8.8* 9.0 8.7* 8.8*  MG  --   --   --  2.4 2.2  --   --  1.9  PHOS  --   --   --   --  2.2*  --   --   --    < > = values in this interval not displayed.   Liver Function Tests: Recent Labs  Lab 01/18/24 0356 01/20/24 0154  AST 18 28  ALT 13 21  ALKPHOS 41 68  BILITOT 0.5 0.4  PROT 4.2* 6.5  ALBUMIN <1.5* 2.6*   No results for input(s): LIPASE, AMYLASE in the last 168 hours. No results for input(s): AMMONIA in the last 168 hours. CBC: Recent Labs  Lab 01/18/24 0356 01/18/24 0416 01/18/24 0545 01/19/24 0304 01/20/24 0154 01/21/24 0205  WBC 5.3  --   --  5.1 5.1 4.2  NEUTROABS 2.4  --   --   --   --   --   HGB 14.1 14.6 15.6 12.8* 12.4* 11.8*  HCT 44.0 43.0 46.0 38.6* 37.0* 35.5*  MCV 101.1*  --   --  98.2 97.9  98.3  PLT 141*  --   --  122* 124* 125*   Cardiac Enzymes: No results for input(s): CKTOTAL, CKMB, CKMBINDEX, TROPONINI in the last 168 hours. BNP: Invalid input(s): POCBNP CBG: No results for input(s): GLUCAP in the last 168 hours. D-Dimer No results for input(s): DDIMER in the last 72 hours. Hgb A1c No results for input(s): HGBA1C in the last 72 hours. Lipid Profile Recent Labs    01/20/24 0154  CHOL 124  HDL 56  LDLCALC 57  TRIG 53  CHOLHDL 2.2   Thyroid  function studies Recent Labs    01/19/24 1550  TSH 0.886   Anemia work up No results for input(s): VITAMINB12, FOLATE, FERRITIN, TIBC, IRON, RETICCTPCT in the last 72 hours. Urinalysis    Component Value Date/Time   COLORURINE YELLOW 09/08/2015 0411   APPEARANCEUR HAZY (A) 09/08/2015 0411   LABSPEC 1.011 09/08/2015 0411   PHURINE 5.5 09/08/2015 0411   GLUCOSEU NEGATIVE 09/08/2015 0411   HGBUR NEGATIVE 09/08/2015 0411   BILIRUBINUR negative 06/25/2019 1115   KETONESUR negative 06/25/2019 1115   KETONESUR  NEGATIVE 09/08/2015 0411   PROTEINUR NEGATIVE 09/08/2015 0411   UROBILINOGEN 0.2 06/25/2019 1115   UROBILINOGEN 0.2 03/28/2012 0118   NITRITE Negative 06/25/2019 1115   NITRITE NEGATIVE 09/08/2015 0411   LEUKOCYTESUR Negative 06/25/2019 1115   Sepsis Labs Recent Labs  Lab 01/18/24 0356 01/19/24 0304 01/20/24 0154 01/21/24 0205  WBC 5.3 5.1 5.1 4.2   Microbiology Recent Results (from the past 240 hours)  SARS Coronavirus 2 by RT PCR (hospital order, performed in [facility] hospital lab) *cepheid single result test* Urine, Clean Catch     Status: None   Collection Time: 01/19/24  4:26 PM   Specimen: Urine, Clean Catch; Nasal Swab  Result Value Ref Range Status   SARS Coronavirus 2 by RT PCR NEGATIVE NEGATIVE Final    Comment: Performed at [facility], [address], [place], [place] [address]  Respiratory (~20 pathogens) panel by PCR     Status: None   Collection Time: 01/19/24  4:27 PM   Specimen: Nasopharyngeal Swab; Respiratory  Result Value Ref Range Status   Adenovirus NOT DETECTED NOT DETECTED Final   Coronavirus 229E NOT DETECTED NOT DETECTED Final    Comment: (NOTE) The Coronavirus on the Respiratory Panel, DOES NOT test for the novel  Coronavirus (2019 nCoV)    Coronavirus HKU1 NOT DETECTED NOT DETECTED Final   Coronavirus NL63 NOT DETECTED NOT DETECTED Final   Coronavirus OC43 NOT DETECTED NOT DETECTED Final   Metapneumovirus NOT DETECTED NOT DETECTED Final   Rhinovirus / Enterovirus NOT DETECTED NOT DETECTED Final   Influenza A NOT DETECTED NOT DETECTED Final   Influenza B NOT DETECTED NOT DETECTED Final   Parainfluenza Virus 1 NOT DETECTED NOT DETECTED Final   Parainfluenza Virus 2 NOT DETECTED NOT DETECTED Final   Parainfluenza Virus 3 NOT DETECTED NOT DETECTED Final   Parainfluenza Virus 4 NOT DETECTED NOT DETECTED Final   Respiratory Syncytial Virus NOT DETECTED NOT DETECTED Final   Bordetella pertussis NOT DETECTED NOT DETECTED Final   Bordetella  Parapertussis NOT DETECTED NOT DETECTED Final   Chlamydophila pneumoniae NOT DETECTED NOT DETECTED Final   Mycoplasma pneumoniae NOT DETECTED NOT DETECTED Final    Comment: Performed at [facility], [address], [place], [place] [address]    Procedures/Studies: ECHOCARDIOGRAM COMPLETE Result Date: 01/20/2024    ECHOCARDIOGRAM REPORT   Patient Name:   [name] Date of  Exam: 01/20/2024 Medical Rec #:  [id number]         Height:       74.0 in Accession #:    [id number]        Weight:       154.0 lb Date of Birth:  [birth year]         BSA:          1.945 m Patient Age:    63 years          BP:           115/72 mmHg Patient Gender: M                 HR:           98 bpm. Exam Location:  Inpatient Procedure: 2D Echo, Cardiac Doppler and Color Doppler (Both Spectral and Color            Flow Doppler were utilized during procedure). Indications:    Elevated troponin  History:        Patient has prior history of Echocardiogram examinations. CHF;                 Signs/Symptoms:Shortness of Breath.  Sonographer:    Thea Norlander Referring Phys: DAYNA N DUNN IMPRESSIONS  1. Left ventricular ejection fraction, by estimation, is 50 to 55%. The left ventricle has low normal function. Left ventricular endocardial border not optimally defined to evaluate regional wall motion. Left ventricular diastolic function could not be evaluated.  2. Right ventricular systolic function is normal. The right ventricular size is normal. Tricuspid regurgitation signal is inadequate for assessing PA pressure.  3. The mitral valve was not well visualized. No evidence of mitral valve regurgitation.  4. The aortic valve is tricuspid. Aortic valve regurgitation is not visualized. No aortic stenosis is present.  5. There is mild dilatation of the ascending aorta, measuring 40 mm.  6. The inferior vena cava is dilated in size with >50% respiratory variability, suggesting right atrial pressure of 8 mmHg. Comparison(s): Changes  from prior study are noted. The left ventricular function has improved. FINDINGS  Left Ventricle: Left ventricular ejection fraction, by estimation, is 50 to 55%. The left ventricle has low normal function. Left ventricular endocardial border not optimally defined to evaluate regional wall motion. The left ventricular internal cavity  size was normal in size. There is no left ventricular hypertrophy. Left ventricular diastolic function could not be evaluated due to nondiagnostic images. Left ventricular diastolic function could not be evaluated. Right Ventricle: The right ventricular size is normal. No increase in right ventricular wall thickness. Right ventricular systolic function is normal. Tricuspid regurgitation signal is inadequate for assessing PA pressure. Left Atrium: Left atrial size was normal in size. Right Atrium: Right atrial size was normal in size. Pericardium: There is no evidence of pericardial effusion. Mitral Valve: The mitral valve was not well visualized. No evidence of mitral valve regurgitation. Tricuspid Valve: The tricuspid valve is grossly normal. Tricuspid valve regurgitation is not demonstrated. No evidence of tricuspid stenosis. Aortic Valve: The aortic valve is tricuspid. Aortic valve regurgitation is not visualized. No aortic stenosis is present. Aortic valve peak gradient measures 8.8 mmHg. Pulmonic Valve: The pulmonic valve was grossly normal. Pulmonic valve regurgitation is not visualized. Aorta: The aortic root is normal in size and structure. There is mild dilatation of the ascending aorta, measuring 40 mm. Venous: The inferior vena cava is dilated in size with greater than 50% respiratory variability, suggesting right  atrial pressure of 8 mmHg. IAS/Shunts: The atrial septum is grossly normal.  LEFT VENTRICLE PLAX 2D LVIDd:         4.50 cm LVIDs:         2.70 cm LV PW:         1.10 cm LV IVS:        0.80 cm LVOT diam:     2.30 cm LV SV:         61 LV SV Index:   31 LVOT Area:      4.15 cm  RIGHT VENTRICLE            IVC RV S prime:     9.46 cm/s  IVC diam: 2.40 cm LEFT ATRIUM           Index        RIGHT ATRIUM          Index LA diam:      2.70 cm 1.39 cm/m   RA Area:     8.28 cm LA Vol (A4C): 20.0 ml 10.28 ml/m  RA Volume:   17.50 ml 9.00 ml/m  AORTIC VALVE AV Area (Vmax): 2.20 cm AV Vmax:        148.00 cm/s AV Peak Grad:   8.8 mmHg LVOT Vmax:      78.25 cm/s LVOT Vmean:     53.650 cm/s LVOT VTI:       0.147 m  AORTA Ao Root diam: 3.90 cm Ao Asc diam:  4.00 cm MITRAL VALVE MV Area (PHT): 4.60 cm    SHUNTS MV Decel Time: 165 msec    Systemic VTI:  0.15 m MV E velocity: 48.20 cm/s  Systemic Diam: 2.30 cm MV A velocity: 62.90 cm/s MV E/A ratio:  0.77 [name] Electronically signed by [name] Signature Date/Time: 01/20/2024/4:13:28 PM    Final    CT Angio Chest Pulmonary Embolism (PE) W or WO Contrast Result Date: 01/19/2024 CLINICAL DATA:  Provided history: hx PE/DVT, noncompliance, SOB/tachycardia, hypoxia EXAM: CT ANGIOGRAPHY CHEST WITH CONTRAST TECHNIQUE: Multidetector CT imaging of the chest was performed using the standard protocol during bolus administration of intravenous contrast. Multiplanar CT image reconstructions and MIPs were obtained to evaluate the vascular anatomy. RADIATION DOSE REDUCTION: This exam was performed according to the departmental dose-optimization program which includes automated exposure control, adjustment of the mA and/or kV according to patient size and/or use of iterative reconstruction technique. CONTRAST:  75mL OMNIPAQUE  IOHEXOL  350 MG/ML SOLN COMPARISON:  Lung cancer screening chest CT 08/02/2023 FINDINGS: Cardiovascular: Motion artifact limitations. Allowing for this, no filling defects in the pulmonary arteries to suggest pulmonary embolus. The heart is normal in size. There are coronary artery calcifications. Aortic atherosclerosis and tortuosity without aortic aneurysm. No acute aortic findings. No pericardial effusion.  Mediastinum/Nodes: No enlarged mediastinal lymph nodes. No hilar adenopathy. Stable 12 mm left axillary node. Breathing motion artifact limits detailed parenchymal assessment. No thyroid  nodule. Small amount of fluid in the mid esophagus without wall thickening. Lungs/Pleura: Emphysema. Severe bronchial thickening with areas of mucoid impaction and bronchial occlusion. Probable mucoid impaction in the left upper lobe versus airway nodule, series 6, image 62. Layering debris/mucus within the mid trachea.Faint tree-in-bud opacity in the right upper lobe, series 6, image 39. Upper Abdomen: Moderate gastric distention with ingested material. No acute upper abdominal findings. Musculoskeletal: Diffuse thoracic spondylosis. Sclerosis about the left anterior sixth rib was not seen on prior exam and may represent healing fracture. Review of the MIP  images confirms the above findings. IMPRESSION: 1. No pulmonary embolus allowing for motion artifact limitations. 2. Severe bronchial thickening with areas of mucoid impaction and bronchial occlusion. Faint tree-in-bud opacity in the right upper lobe, likely infectious or inflammatory. 3. Probable mucoid impaction in the left upper lobe versus airway nodule. Recommend attention on annual lung cancer screening exam. 4. Sclerosis about the left anterior sixth rib was not seen on prior exam and may represent healing fracture. 5. Coronary artery calcifications. Aortic Atherosclerosis (ICD10-I70.0) and Emphysema (ICD10-J43.9). Electronically Signed   By: Andrea Gasman M.D.   On: 01/19/2024 18:53   DG Chest Portable 1 View Result Date: 01/18/2024 EXAM: 1 VIEW(S) XRAY OF THE CHEST 01/18/2024 04:03:40 AM COMPARISON: 05/22/23 CLINICAL HISTORY: eval for sob FINDINGS: LUNGS AND PLEURA: Unchanged appearance of diffuse chronic bronchitic changes. No focal pulmonary opacity. No pulmonary edema. No pleural effusion. No pneumothorax. HEART AND MEDIASTINUM: Calcified aorta. No acute  abnormality of the cardiac and mediastinal silhouettes. BONES AND SOFT TISSUES: Thoracic degenerative changes. No acute osseous abnormality. IMPRESSION: 1. No acute cardiopulmonary abnormality. Electronically signed by: Waddell Calk [name] 01/18/2024 05:21 AM EDT RP Workstation: [id number]   MR THORACIC SPINE WO CONTRAST Result Date: 01/09/2024  [facility] [address], [address] [place], [place] [address] [phone] NEUROIMAGING REPORT STUDY DATE: 01/09/2024 PATIENT NAME: [name] DOB: Nov 10, 1960 MRN: [id number] ORDERING CLINICIAN: Dr. Onita CLINICAL HISTORY: 63 year old patient being evaluated for gait abnormality and weakness COMPARISON FILMS: None available EXAM: MRI thoracic spine without contrast TECHNIQUE: Sagittal T1, T2, STIR and axial T2 and T2 medic images were obtained through thoracic spine CONTRAST: None IMAGING SITE: [place] imaging FINDINGS: The thoracic vertebra demonstrate normal alignment, body height and marrow signal characteristics.  The intervertebral discs show minor disc signal abnormalities with loss of disc height but there is no frank disc herniation, cord compression significant root or foraminal encroachment.  There are minor facet degenerative changes throughout.  At T11-12 there is mild cystic degenerative changes in the facet joints.  The spinal cord dimensions appear adequate throughout.  Spinal cord parenchyma shows no abnormal signal intensities.  Paraspinal soft tissues unremarkable.  Visualized portion of lower cervical spine also show minor degenerative changes.   MRI scan of thoracic spine without contrast showing only minor disc degenerative changes without significant compression. INTERPRETING PHYSICIAN: EATHER POPP, [name] Certified in  Neuroimaging by American Society of Neuroimaging and United Council for Neurological Subspecialities  MR CERVICAL SPINE WO CONTRAST Result Date: 01/09/2024  [facility] [address], [address] [place], [place] [address] [phone] NEUROIMAGING REPORT STUDY DATE: 01/09/2024 PATIENT NAME: [name] DOB: [birth year] MRN: [id number] ORDERING CLINICIAN: Dr. Onita CLINICAL HISTORY: 63 year old patient being evaluated for gait abnormality and weakness COMPARISON FILMS: CT cervical spine 07/15/2023 EXAM: MRI cervical spine without contrast TECHNIQUE: Sagittal T1, T2, STIR, axial T2 and medic images were obtained through cervical spine CONTRAST: None IMAGING SITE: [place] [facility] FINDINGS: The cervical vertebrae demonstrate exaggerated forward lordotic curvature but normal body height and marrow signal characteristics. C2-3 shows mild loss of disc height and facet hypertrophy but no compression.  C3-4 shows broad-based central disc bulge along with facet and ligamentum flavum hypertrophy resulting in mild canal and moderate right-sided foraminal narrowing with no definite nerve root impingement. C4-5 also shows broad-based disc protrusion resulting in effacement of thecal sac ventrally and facet hypertrophy resulting in right greater than left mild foraminal narrowing but no definite nerve root compression. C5-6 also shows focal central disc protrusion  and ligamentum flavum hypertrophy resulting in slight effacement of thecal sac ventrally and mild left greater than right foraminal narrowing. C6/7 shows only minor disc degenerative change without compression. The spinal canal dimensions are narrow from C3-C5.  Spinal cord parenchyma shows no abnormal signal characteristics.  Visualized portion of the lower brainstem and craniovertebral junction appear unremarkable.  Paraspinal soft tissue appear unremarkable.   MRI scan of the cervical spine without contrast showing prominent spondylitic changes from C3-C6 with mild canal narrowing and bilateral foraminal narrowing but no significant compression. INTERPRETING PHYSICIAN: EATHER POPP, [name] Certified in  Neuroimaging by American Society of Neuroimaging and  United Council for Neurological Subspecialities  DG Foot Complete Left Result Date: 12/26/2023 Please see detailed radiograph report in office note.    Time coordinating discharge: Over 30 minutes    Alm Apo, [name]  [facility] 01/22/2024, 12:15 PM

## 2024-01-22 NOTE — Progress Notes (Signed)
 Physical Therapy Treatment Patient Details Name: [name] MRN: [id number] DOB: 1960/06/04 Today's Date: 01/22/2024   History of Present Illness 63 y.o. M adm 01/18/24 with SOB, COPD exacerbation, requiring BiPAP. PMHx: polysubstance use, CAD, PE/DVT on Eliquis , NSTEMI, CHF, HTN, COPD, Hepatitis C, liver fibrosis, psoriasis.    PT Comments  Pt pleasant with decreased safety awareness but improving gait tolerance. Pt states he is agreeable to HHPT and has elevator to get to apartment. Pt aware of falls but declined need for rollator this date and reports he doesn't use it consistently at home and never takes it in the community because of difficulty transferring onto bus. Session limited by need for BM and nursing staff aware of pt in bathroom end of session. Will continue to follow with HHPT recommended.     If plan is discharge home, recommend the following: A little help with walking and/or transfers;Assistance with cooking/housework;Assist for transportation   Can travel by private vehicle        Equipment Recommendations  BSC/3in1    Recommendations for Other Services       Precautions / Restrictions Precautions Precautions: Fall Recall of Precautions/Restrictions: Intact     Mobility  Bed Mobility Overal bed mobility: Modified Independent             General bed mobility comments: HOB 25 degrees, use of rail    Transfers Overall transfer level: Modified independent                 General transfer comment: pt able to stand from bed and lower to low toilet without assist    Ambulation/Gait Ambulation/Gait assistance: Contact guard assist Gait Distance (Feet): 250 Feet Assistive device: None Gait Pattern/deviations: Step-through pattern, Decreased stride length, Trunk flexed, Antalgic   Gait velocity interpretation: 1.31 - 2.62 ft/sec, indicative of limited community ambulator   General Gait Details: pt with slight limp stating toe pain and that  he has been having impaired gait for 4 months due to toe, no LOB, able to wayfind back to room   Stairs             Wheelchair Mobility     Tilt Bed    Modified Rankin (Stroke Patients Only)       Balance Overall balance assessment: History of Falls   Sitting balance-Leahy Scale: Good     Standing balance support: During functional activity, No upper extremity supported Standing balance-Leahy Scale: Good                              Communication Communication Communication: No apparent difficulties  Cognition Arousal: Alert Behavior During Therapy: WFL for tasks assessed/performed   PT - Cognitive impairments: Awareness, Safety/Judgement                       PT - Cognition Comments: pt initially stating he wasn't walking, then moving to get up and walk, decreased problem solving Following commands: Intact      Cueing Cueing Techniques: Verbal cues  Exercises      General Comments        Pertinent Vitals/Pain Pain Assessment Pain Assessment: No/denies pain    Home Living                          Prior Function            PT Goals (current goals  can now be found in the care plan section) Progress towards PT goals: Progressing toward goals    Frequency    Min 2X/week      PT Plan      Co-evaluation              AM-PAC PT 6 Clicks Mobility   Outcome Measure  Help needed turning from your back to your side while in a flat bed without using bedrails?: None Help needed moving from lying on your back to sitting on the side of a flat bed without using bedrails?: None Help needed moving to and from a bed to a chair (including a wheelchair)?: None Help needed standing up from a chair using your arms (e.g., wheelchair or bedside chair)?: A Little Help needed to walk in hospital room?: A Little Help needed climbing 3-5 steps with a railing? : A Little 6 Click Score: 21    End of Session   Activity  Tolerance: Patient tolerated treatment well Patient left: Other (comment) (in bathroom with pull cord) Nurse Communication: Mobility status PT Visit Diagnosis: Repeated falls (R29.6);Other abnormalities of gait and mobility (R26.89)     Time: 9141-9087 PT Time Calculation (min) (ACUTE ONLY): 14 min  Charges:    $Gait Training: 8-22 mins PT General Charges $$ ACUTE PT VISIT: 1 Visit                     Lenoard SQUIBB, PT Acute Rehabilitation Services Office: [phone]    Lenoard KATHEE Docker 01/22/2024, 9:48 AM

## 2024-01-22 NOTE — Progress Notes (Signed)
 Progress Note  Patient Name: [name] Date of Encounter: 01/22/2024  Primary Cardiologist:   Soyla DELENA Merck, MD   Subjective   Breathing a thousand percent better.  Still with cough and not quite at baseline.  No pain.   Inpatient Medications    Scheduled Meds:  apixaban   5 mg Oral BID   budesonide  (PULMICORT ) nebulizer solution  0.25 mg Nebulization BID   ezetimibe   10 mg Oral Daily   gabapentin   100 mg Oral QHS   isosorbide  mononitrate  30 mg Oral Daily   methylPREDNISolone  (SOLU-MEDROL ) injection  40 mg Intravenous Daily   montelukast   10 mg Oral QHS   Continuous Infusions:  PRN Meds: acetaminophen , alum & mag hydroxide-simeth, guaiFENesin -dextromethorphan , ipratropium-albuterol , morphine  injection, polyethylene glycol   Vital Signs    Vitals:   01/21/24 2023 01/22/24 0019 01/22/24 0341 01/22/24 0759  BP:  (!) 143/87 139/88   Pulse:  88 68 85  Resp:  (!) 22 16   Temp:  98 F (36.7 C) 97.9 F (36.6 C)   TempSrc:  Oral Oral   SpO2: 94% 97% 97% 97%  Weight:   72.1 kg   Height:        Intake/Output Summary (Last 24 hours) at 01/22/2024 0823 Last data filed at 01/22/2024 0347 Gross per 24 hour  Intake 240 ml  Output 1100 ml  Net -860 ml   Filed Weights   01/20/24 0453 01/21/24 0439 01/22/24 0341  Weight: 69.9 kg (P) 70.8 kg 72.1 kg    Telemetry    NSR - Personally Reviewed  ECG    NA - Personally Reviewed  Physical Exam   GEN: No acute distress.   Neck: No  JVD Cardiac: RRR, no murmurs, rubs, or gallops.  Respiratory:     Decreased breath diffusely.  GI: Soft, nontender, non-distended  MS: No  edema; No deformity. Neuro:  Nonfocal  Psych: Normal affect   Labs    Chemistry Recent Labs  Lab 01/18/24 0356 01/18/24 0416 01/19/24 1550 01/20/24 0154 01/21/24 0205  NA 134*   < > 137 136 134*  K 2.0*   < > 5.3* 4.9 3.8  CL 113*   < > 106 102 103  CO2 13*   < > 21* 25 23  GLUCOSE 103*   < > 152* 139* 140*  BUN <5*   <  > 18 27* 19  CREATININE 0.42*   < > 0.96 0.95 0.71  CALCIUM  5.8*   < > 9.0 8.7* 8.8*  PROT 4.2*  --   --  6.5  --   ALBUMIN <1.5*  --   --  2.6*  --   AST 18  --   --  28  --   ALT 13  --   --  21  --   ALKPHOS 41  --   --  68  --   BILITOT 0.5  --   --  0.4  --   GFRNONAA >60   < > >60 >60 >60  ANIONGAP 8   < > 10 9 8    < > = values in this interval not displayed.     Hematology Recent Labs  Lab 01/19/24 0304 01/20/24 0154 01/21/24 0205  WBC 5.1 5.1 4.2  RBC 3.93* 3.78* 3.61*  HGB 12.8* 12.4* 11.8*  HCT 38.6* 37.0* 35.5*  MCV 98.2 97.9 98.3  MCH 32.6 32.8 32.7  MCHC 33.2 33.5 33.2  RDW 14.3 14.6 14.0  PLT  122* 124* 125*    Cardiac EnzymesNo results for input(s): TROPONINI in the last 168 hours. No results for input(s): TROPIPOC in the last 168 hours.   BNP Recent Labs  Lab 01/18/24 0327  BNP 45.8     DDimer No results for input(s): DDIMER in the last 168 hours.   Radiology    ECHOCARDIOGRAM COMPLETE Result Date: 01/20/2024    ECHOCARDIOGRAM REPORT   Patient Name:   [name] Date of Exam: 01/20/2024 Medical Rec #:  [id number]         Height:       74.0 in Accession #:    [id number]        Weight:       154.0 lb Date of Birth:  06/20/1960         BSA:          1.945 m Patient Age:    63 years          BP:           115/72 mmHg Patient Gender: M                 HR:           98 bpm. Exam Location:  Inpatient Procedure: 2D Echo, Cardiac Doppler and Color Doppler (Both Spectral and Color            Flow Doppler were utilized during procedure). Indications:    Elevated troponin  History:        Patient has prior history of Echocardiogram examinations. CHF;                 Signs/Symptoms:Shortness of Breath.  Sonographer:    Thea Norlander Referring Phys: DAYNA N DUNN IMPRESSIONS  1. Left ventricular ejection fraction, by estimation, is 50 to 55%. The left ventricle has low normal function. Left ventricular endocardial border not optimally defined to evaluate  regional wall motion. Left ventricular diastolic function could not be evaluated.  2. Right ventricular systolic function is normal. The right ventricular size is normal. Tricuspid regurgitation signal is inadequate for assessing PA pressure.  3. The mitral valve was not well visualized. No evidence of mitral valve regurgitation.  4. The aortic valve is tricuspid. Aortic valve regurgitation is not visualized. No aortic stenosis is present.  5. There is mild dilatation of the ascending aorta, measuring 40 mm.  6. The inferior vena cava is dilated in size with >50% respiratory variability, suggesting right atrial pressure of 8 mmHg. Comparison(s): Changes from prior study are noted. The left ventricular function has improved. FINDINGS  Left Ventricle: Left ventricular ejection fraction, by estimation, is 50 to 55%. The left ventricle has low normal function. Left ventricular endocardial border not optimally defined to evaluate regional wall motion. The left ventricular internal cavity  size was normal in size. There is no left ventricular hypertrophy. Left ventricular diastolic function could not be evaluated due to nondiagnostic images. Left ventricular diastolic function could not be evaluated. Right Ventricle: The right ventricular size is normal. No increase in right ventricular wall thickness. Right ventricular systolic function is normal. Tricuspid regurgitation signal is inadequate for assessing PA pressure. Left Atrium: Left atrial size was normal in size. Right Atrium: Right atrial size was normal in size. Pericardium: There is no evidence of pericardial effusion. Mitral Valve: The mitral valve was not well visualized. No evidence of mitral valve regurgitation. Tricuspid Valve: The tricuspid valve is grossly normal. Tricuspid valve regurgitation is not demonstrated. No  evidence of tricuspid stenosis. Aortic Valve: The aortic valve is tricuspid. Aortic valve regurgitation is not visualized. No aortic stenosis  is present. Aortic valve peak gradient measures 8.8 mmHg. Pulmonic Valve: The pulmonic valve was grossly normal. Pulmonic valve regurgitation is not visualized. Aorta: The aortic root is normal in size and structure. There is mild dilatation of the ascending aorta, measuring 40 mm. Venous: The inferior vena cava is dilated in size with greater than 50% respiratory variability, suggesting right atrial pressure of 8 mmHg. IAS/Shunts: The atrial septum is grossly normal.  LEFT VENTRICLE PLAX 2D LVIDd:         4.50 cm LVIDs:         2.70 cm LV PW:         1.10 cm LV IVS:        0.80 cm LVOT diam:     2.30 cm LV SV:         61 LV SV Index:   31 LVOT Area:     4.15 cm  RIGHT VENTRICLE            IVC RV S prime:     9.46 cm/s  IVC diam: 2.40 cm LEFT ATRIUM           Index        RIGHT ATRIUM          Index LA diam:      2.70 cm 1.39 cm/m   RA Area:     8.28 cm LA Vol (A4C): 20.0 ml 10.28 ml/m  RA Volume:   17.50 ml 9.00 ml/m  AORTIC VALVE AV Area (Vmax): 2.20 cm AV Vmax:        148.00 cm/s AV Peak Grad:   8.8 mmHg LVOT Vmax:      78.25 cm/s LVOT Vmean:     53.650 cm/s LVOT VTI:       0.147 m  AORTA Ao Root diam: 3.90 cm Ao Asc diam:  4.00 cm MITRAL VALVE MV Area (PHT): 4.60 cm    SHUNTS MV Decel Time: 165 msec    Systemic VTI:  0.15 m MV E velocity: 48.20 cm/s  Systemic Diam: 2.30 cm MV A velocity: 62.90 cm/s MV E/A ratio:  0.77 Darryle Decent MD Electronically signed by Darryle Decent MD Signature Date/Time: 01/20/2024/4:13:28 PM    Final     Cardiac Studies   Echo:  See above.   Patient Profile     63 y.o. male with CAD s/p BMS to D1 in 2015, HFmrEF, recurrent DVT/PE s/p IVC filter, COPD, and polysubstance abuse admitted with shortness of breath and acute COPD/asthma exacerbation. Cardiology consulted for elevated troponin.   Assessment & Plan    Elevated troponin:   EF is 50 - 55% not significantly changed from previous (slightly improved .)  This is thought to be demand ischemia in the setting of COPD  flare in this situation.  He has known obstructive CAD that is being medically managed.  Resume beta blocker when reactive airway component of his acute respiratory decline is improved.  Continue current meds and resume Jardiance  at [facility].     Recurrent DVT:  S/P IVC filter.  Continue Eliquis .    Hyperlipidemia:   Continue Zetia  and Repatha .   LDL 57.    QT Prolongation:   Avoid QT prolonging medications.  Maintain K>4, Mg>2.     For questions or updates, please contact [facility] Please consult [email or web address] for contact info under Cardiology/STEMI.   Signed,  Lynwood Schilling, MD  01/22/2024, 8:23 AM

## 2024-01-23 ENCOUNTER — Telehealth: Payer: Self-pay | Admitting: *Deleted

## 2024-01-23 ENCOUNTER — Other Ambulatory Visit (HOSPITAL_COMMUNITY): Payer: Self-pay

## 2024-01-23 NOTE — Transitions of Care (Post Inpatient/ED Visit) (Signed)
   01/23/2024  Name: [name] MRN: [id number] DOB: [birth year]  Today's TOC FU Call Status: Today's TOC FU Call Status:: Unsuccessful Call (1st Attempt) Unsuccessful Call (1st Attempt) Date: 01/23/24  Attempted to reach the patient regarding the most recent Inpatient/ED visit.  Follow Up Plan: Additional outreach attempts will be made to reach the patient to complete the Transitions of Care (Post Inpatient/ED visit) call.   Andrea Dimes RN, BSN [place]  [facility] RN Care Manager [phone]

## 2024-01-24 ENCOUNTER — Telehealth: Payer: Self-pay

## 2024-01-24 NOTE — Transitions of Care (Post Inpatient/ED Visit) (Signed)
   01/24/2024  Name: [name] MRN: [id number] DOB: [birth year]  Today's TOC FU Call Status: Today's TOC FU Call Status:: Unsuccessful Call (3rd Attempt) Unsuccessful Call (3rd Attempt) Date: 01/24/24  Attempted to reach the patient regarding the most recent Inpatient/ED visit.  Follow Up Plan: No further outreach attempts will be made at this time. We have been unable to contact the patient.  Alan Ee, RN, BSN, CEN [employer]- Transition of Care Team.  [facility] [phone]

## 2024-01-24 NOTE — Transitions of Care (Post Inpatient/ED Visit) (Signed)
   01/24/2024  Name: [name] MRN: [id number] DOB: 1960-04-01  Today's TOC FU Call Status: Today's TOC FU Call Status:: Unsuccessful Call (2nd Attempt) Unsuccessful Call (2nd Attempt) Date: 01/24/24  Attempted to reach the patient regarding the most recent Inpatient/ED visit.  Follow Up Plan: Additional outreach attempts will be made to reach the patient to complete the Transitions of Care (Post Inpatient/ED visit) call.   Alan Ee, RN, BSN, CEN [employer]- Transition of Care Team.  [facility] [phone]

## 2024-01-29 ENCOUNTER — Telehealth: Payer: Self-pay | Admitting: Family Medicine

## 2024-01-29 NOTE — Telephone Encounter (Signed)
 Copied from CRM [id number]. Topic: Clinical - Home Health Verbal Orders >> Jan 29, 2024  3:14 PM Delon DASEN wrote:  Caller/Agency: Debbie with [facility] Callback Number: [phone] office number  Service Requested: Occupational Therapy and Physical Therapy Frequency: n/a Any new concerns about the patient? No- would like to see patient on 11/7- need to get auth from insurance

## 2024-01-30 NOTE — Telephone Encounter (Signed)
 Left message for [name] to return phone call.

## 2024-01-30 NOTE — Telephone Encounter (Signed)
Verbal orders given for patient.

## 2024-02-01 ENCOUNTER — Encounter: Payer: Self-pay | Admitting: Podiatry

## 2024-02-01 ENCOUNTER — Ambulatory Visit: Admitting: Podiatry

## 2024-02-01 DIAGNOSIS — Z7901 Long term (current) use of anticoagulants: Secondary | ICD-10-CM

## 2024-02-01 DIAGNOSIS — M2042 Other hammer toe(s) (acquired), left foot: Secondary | ICD-10-CM | POA: Diagnosis not present

## 2024-02-01 NOTE — Telephone Encounter (Unsigned)
 Copied from CRM [id number]. Topic: Clinical - Medical Advice >> Feb 01, 2024 10:21 AM [name] wrote: Reason for CRM: [name], Pt with [facility], calling for verbal approval to move session to 02/05/24 per patient request. Please c/b: [phone], secure line, can leave VM

## 2024-02-01 NOTE — Telephone Encounter (Signed)
 LVM with verbal orders.

## 2024-02-01 NOTE — Progress Notes (Signed)
 This patient returns to my office for at risk foot care.  This patient requires this care by a professional since this patient will be at risk due to having coagulation defect. Patient is taking eliquis .  The fourth toe left foot is very painful when walking.  He was treated with debridement and padding but pain quickly returned  This patient presents for evaluation and treatment.  General Appearance  Alert, conversant and in no acute stress.  Vascular  Dorsalis pedis and posterior tibial  pulses are weakly palpable  bilaterally.  Capillary return is within normal limits  bilaterally. Temperature is within normal limits  bilaterally.  Neurologic  Senn-Weinstein monofilament wire test within normal limits  bilaterally. Muscle power within normal limits bilaterally.  Nails Thick disfigured discolored nails with subungual debris  from hallux to fifth toes bilaterally. No evidence of bacterial infection or drainage bilaterally.  Orthopedic  No limitations of motion  feet .  No crepitus or effusions noted. Hammer toes 2-4  B/L.  Skin  normotropic skin with no porokeratosis noted bilaterally.  No signs of infections or ulcers noted.   Clavi 4th toe left foot.  Clavi 4th toe left  Consent was obtained for treatment procedures.  ROV.   Return office visit    3    months                  Told patient to return for periodic foot care and evaluation due to potential at risk complications.   Cordella Bold DPM

## 2024-02-05 ENCOUNTER — Telehealth: Payer: Self-pay | Admitting: Family Medicine

## 2024-02-05 NOTE — Telephone Encounter (Signed)
 Verbal orders were given for patient.

## 2024-02-05 NOTE — Telephone Encounter (Signed)
 Copied from CRM [id number]. Topic: Clinical - Home Health Verbal Orders >> Feb 05, 2024 11:35 AM Wess RAMAN wrote:  Caller/Agency: [place], [facility] Callback Number: [phone] Service Requested: Physical Therapy Frequency: 1 week 7 Any new concerns about the patient? No

## 2024-02-18 ENCOUNTER — Telehealth (HOSPITAL_BASED_OUTPATIENT_CLINIC_OR_DEPARTMENT_OTHER): Payer: Self-pay

## 2024-02-18 NOTE — Telephone Encounter (Signed)
   Pre-operative Risk Assessment    Patient Name: [name]  DOB: [birth year] MRN: [id number]   Date of last office visit: 02/01/2023, Scot Ford, PA Date of next office visit: NONE   Request for Surgical Clearance    Procedure:  elective lacrimal gland biopsy  Date of Surgery:  Clearance 02/27/24                                Surgeon: Not listed  Surgeon's Group or Practice Name: [facility]  Phone number: not provided  Fax number: [phone]   Type of Clearance Requested:   - Medical  - Pharmacy:  Hold Apixaban  (Eliquis )     Type of Anesthesia:  General    Additional requests/questions:    Bonney Asberry KANDICE Ethelene   02/18/2024, 5:45 PM

## 2024-02-19 ENCOUNTER — Telehealth: Payer: Self-pay | Admitting: Family Medicine

## 2024-02-19 NOTE — Telephone Encounter (Signed)
 Noted, fax received.

## 2024-02-19 NOTE — Telephone Encounter (Signed)
 Copied from CRM #[id number]. Topic: Clinical - Medication Question >> Feb 19, 2024  2:14 PM Mia F wrote:  Reason for CRM: Zelda from [facility] faxed over paperwork for a preop paperwork to hold medication prior to surgery  thats on 12/3. She says she faxed it twice on 11/18 and yestrrday. She says pt is on apixaban  (ELIQUIS ) 5 MG TABS tablet and the surgery is a surgery where pts often lose a lot of blood and to be on the safe side pt would need to stop medication. Called [facility] to verify if fax was recied and was told to have Zelda to fax it again. Fax# was confirmed and Zelda faxed the form over again and asks for a call before the end of the business day regarding this matter   Please call [phone] Alphonsa)

## 2024-02-19 NOTE — Telephone Encounter (Signed)
   Name: [name]  DOB: 03/18/1961  MRN: [id number]  Primary Cardiologist: Soyla DELENA Merck, MD  Chart reviewed as part of pre-operative protocol coverage. Because of [name] past medical history and time since last visit, he will require a follow-up in-office visit in order to better assess preoperative cardiovascular risk.  Patient was recently hospitalized and has not been seen in office since.  Pre-op covering staff: - Please schedule appointment and call patient to inform them. If patient already had an upcoming appointment within acceptable timeframe, please add pre-op clearance to the appointment notes so provider is aware. - Please contact requesting surgeon's office via preferred method (i.e, phone, fax) to inform them of need for appointment prior to surgery.  Patient takes Eliquis  for history of DVT, this is not managed by cardiology.  Recommendations for holding Eliquis  prior to surgery should come from managing provider (PCP).  Damien JAYSON Braver, NP  02/19/2024, 8:40 AM

## 2024-02-19 NOTE — Telephone Encounter (Signed)
 Copied from CRM [id number]. Topic: General - Other >> Feb 19, 2024  4:12 PM Fonda T wrote:  Reason for CRM: Received call from [place], [place] with [facility].  Calling to check status of faxed request, inquiring if pt  can hold taking medication, Eliquis  3 days prior to upcoming surgery on 02/27/24.  States request was faxed to office on 02/12/24, 02/18/24, and today, but no response as been received.  Called and spoke to front office, Zara, advised to inform caller to refax form, and she will personally place in provider's box.  Advised caller of above, verbalized understanding. Will refax form to [phone].  Requesting a follow up call as soon as possible in regards to this.   Ph. [phone]

## 2024-02-19 NOTE — Telephone Encounter (Signed)
 Tried to reach the pt who needs an appt IN OFFICE FOR PREOP CLEARANCE. Recording came on when I dialed [phone] stating # not in service.    I will update the requesting office the pt needs to call the office to schedule an appt in office before he can be cleared for upcoming procedure.

## 2024-02-20 NOTE — Telephone Encounter (Signed)
 2nd attempt to reach the pt who needs an appt in office before he can be cleared for procedure.   I will update the requesting office.

## 2024-02-20 NOTE — Telephone Encounter (Signed)
 Form has been faxed to [facility] this morning.

## 2024-02-25 NOTE — Telephone Encounter (Signed)
 Found the correct office for the surgeon who will be doing procedure.   Dr. Kevin Bottoms.  Phone # [phone]  Per Hayley, surgery sched, use  fax# [phone]  I s/w Hayley as that we have been trying to reach the pt to schedule an appt in office. In our conversation I had stated that we have not been able to reach the pt. Hayley stated the PA Annabelle Preske, PAC s/w the pt's sister. I stated per our documentation for a DPR looks like the last one I can find is from 2023 and the pt has no one listed.   I stated in trying to care for the pt I will reach out to the brother as I see him listed as emergency contact. I will not disclose info;  just ask for pt to call our office back. S/w the pt's brother and sister who were together today and asked of pt would please call [phone] as he needs to make an appt. They stated the pt has to go to a drug class every Monday and he will not be back until after 5 pm today.    I will update Hayley with my conversations today. I did state when I s/w Hayley earlier that I am not going to be able to get the pt in before 02/27/24 however at this moment we could see him at [facility] 02/29/24 1:55 Reche Finder, NP or [place] 03/18/24. Cannot promise these appts will be available when the pt calls back. However, we will do our very best to help accommodate the pt.

## 2024-02-26 NOTE — Telephone Encounter (Signed)
 Pt returning call  to sched Pre Op appt.

## 2024-02-26 NOTE — Telephone Encounter (Signed)
 I called the # that the operator said the pt called from; however this # listed [phone] is the surgeon office. I asked was the pt there. Surgery scheduler said no.   I called [phone] pt's sister and she said she is on her way home and she will tell him to call us . I told her that I got a message from our one of our operators but in her message she out down the surgeon # for us  to call back , but said the pt was calling back.   Pt 's sister said she will have him call us  to make appt for preop clearance.

## 2024-02-27 ENCOUNTER — Other Ambulatory Visit: Payer: Self-pay

## 2024-02-27 ENCOUNTER — Other Ambulatory Visit: Payer: Self-pay | Admitting: Pharmacist

## 2024-02-27 NOTE — Telephone Encounter (Signed)
 I called and s/w the pt's sister again. I apologized that we keep calling however we still have not been able to reach the pt. She tells me that she and her brothers have been trying to locate the pt as well and they have not been able to find him. I asked for her if she s/w the pt to have him call our office about the preop clearance. She said she will and thanked me for reaching out to her.    At this time I will remove from the preop call back pool and update the preop APP and the requesting office.

## 2024-02-27 NOTE — Progress Notes (Signed)
 Pharmacy Quality Measure Review  This patient is appearing on a report for adherence measure for HTN and DM medications this calendar year.   Medication: empagliflozin  Last fill date: 02/28/24 for 90 day supply  No follow-up needed at this time.  Medication: losartan  Last fill date: 11/29/23 for 90 day supply  Requested pharmacy to fill this for him.   Herlene Fleeta Morris, PharmD, JAQUELINE, CPP Clinical Pharmacist [facility] [phone]

## 2024-03-07 ENCOUNTER — Other Ambulatory Visit (HOSPITAL_COMMUNITY): Payer: Self-pay

## 2024-03-07 ENCOUNTER — Telehealth: Payer: Self-pay | Admitting: Family Medicine

## 2024-03-07 ENCOUNTER — Other Ambulatory Visit: Payer: Self-pay

## 2024-03-07 NOTE — Telephone Encounter (Signed)
 Copied from CRM #[id number]. Topic: Clinical - Home Health Verbal Orders >> Mar 07, 2024  3:10 PM Leonette P wrote:  Caller/Agency: chris  Callback Number: [facility]  Service Requested: Physical Therapy  Will miss his appt this week because they could not reach him.

## 2024-03-07 NOTE — Telephone Encounter (Signed)
 Noted

## 2024-03-10 ENCOUNTER — Ambulatory Visit: Admitting: Family Medicine

## 2024-03-18 ENCOUNTER — Ambulatory Visit: Attending: Internal Medicine | Admitting: Internal Medicine

## 2024-04-03 ENCOUNTER — Telehealth: Payer: Self-pay | Admitting: Family Medicine

## 2024-04-03 NOTE — Telephone Encounter (Signed)
 Copied from CRM #[id number]. Topic: Clinical - Home Health Verbal Orders >> Apr 03, 2024 10:43 AM Selinda RAMAN wrote:  Caller/Agency: Nana PT with [facility] Callback Number: [phone] Service Requested: Discharge Patient  Any new concerns about the patient? Yes as patient missed his last visit which was the end of his certification period. They now need to discharge the patient and want the provider to know. If there are any questions for [facility] please call [phone].

## 2024-04-03 NOTE — Telephone Encounter (Signed)
 FYI

## 2024-04-04 ENCOUNTER — Ambulatory Visit: Admitting: Podiatry

## 2024-04-04 DIAGNOSIS — M62461 Contracture of muscle, right lower leg: Secondary | ICD-10-CM

## 2024-04-04 DIAGNOSIS — M722 Plantar fascial fibromatosis: Secondary | ICD-10-CM

## 2024-04-04 NOTE — Progress Notes (Signed)
 "  Subjective:  Patient ID: [name], male    DOB: 1960/12/21,  MRN: [id number]  Chief Complaint  Patient presents with   Foot Pain    64 y.o. male presents with the above complaint.  Patient presents with right heel pain that has been off for quite some time is progressing and worse worse with ambulation and shoe pressure he wanted to discuss treatment options for his causing him to have pain while he is sleeping pain scale is 7 out of 10 dull aching nature.  He would like to discuss steroid injection.   Review of Systems: Negative except as noted in the HPI. Denies N/V/F/Ch.  Past Medical History:  Diagnosis Date   Asthma    CAD (coronary artery disease)    a. 09/2013 NSTEMI/PCI: LM nl, LAD 40-50%, D1 100 (2.25 x 28 Vision BMS), LCX min irregs, RI 60-70, 30, RCA 40-50/50-87ms/p, EF 45-50%.  b. cath 12/2014 -occulded BMS in diag, 50% pro to mid LAD, 60% ramus, 30% RCA    Chest pain 08/2015   Chronic heart failure with preserved ejection fraction (HFpEF) (HCC)    a. 09/2014 EF 45-50% by LV gram;  b. 01/2014 Echo: EF 55-60%, Gr 1 DD.   Cocaine  abuse (HCC)    COPD (chronic obstructive pulmonary disease) (HCC)    DVT (deep venous thrombosis) (HCC)    I had ~ 10 in each leg (12/20/2016)   Essential hypertension    Hepatitis C    clear free over a year now   High cholesterol    Myocardial infarction [facility]) ~ 2014   Osteoarthritis    a. hands and toes.   Pneumonia    several times (12/20/2016)   Pulmonary embolism (HCC)    a. 2012 - s/p IVC filter;  b. prev on eliquis  - noncompliant.   Shortness of breath dyspnea    Sinus headache    Squamous cell skin cancer 07/13/2016   foot   Tobacco abuse    Current Medications[1]  Tobacco Use History[2]  Allergies[3] Objective:  There were no vitals filed for this visit. There is no height or weight on file to calculate BMI. Constitutional Well developed. Well nourished.  Vascular Dorsalis pedis pulses palpable  bilaterally. Posterior tibial pulses palpable bilaterally. Capillary refill normal to all digits.  No cyanosis or clubbing noted. Pedal hair growth normal.  Neurologic Normal speech. Oriented to person, place, and time. Epicritic sensation to light touch grossly present bilaterally.  Dermatologic Nails well groomed and normal in appearance. No open wounds. No skin lesions.  Orthopedic: Normal joint ROM without pain or crepitus bilaterally. No visible deformities. Tender to palpation at the calcaneal tuber right. No pain with calcaneal squeeze right. Ankle ROM diminished range of motion right. Silfverskiold Test: positive right.   Radiographs: None  Assessment:   1. Plantar fasciitis of right foot   2. Gastrocnemius equinus of right lower extremity    Plan:  Patient was evaluated and treated and all questions answered.  Plantar Fasciitis, right with underlying gastrocnemius equinus - XR reviewed as above.  - Educated on icing and stretching. Instructions given.  - Injection delivered to the plantar fascia as below. - DME: Plantar fascial brace dispensed to support the medial longitudinal arch of the foot and offload pressure from the heel and prevent arch collapse during weightbearing - Pharmacologic management: None  Procedure: Injection Tendon/Ligament Location: Right plantar fascia at the glabrous junction; medial approach. Skin Prep: alcohol Injectate: 0.5 cc 0.5% marcaine  plain,  0.5 cc of 1% Lidocaine , 0.5 cc kenalog  10. Disposition: Patient tolerated procedure well. Injection site dressed with a band-aid.  No follow-ups on file.    [1]  Current Outpatient Medications:    albuterol  (PROAIR  HFA) 108 (90 Base) MCG/ACT inhaler, Inhale 2 puffs into the lungs every 6 (six) hours as needed for wheezing or shortness of breath., Disp: 6.7 g, Rfl: 6   albuterol  (PROVENTIL ) (2.5 MG/3ML) 0.083% nebulizer solution, Take 3 mLs (2.5 mg total) by nebulization every 6 (six) hours  as needed for wheezing or shortness of breath., Disp: 90 mL, Rfl: 6   apixaban  (ELIQUIS ) 5 MG TABS tablet, Take 1 tablet (5 mg total) by mouth 2 (two) times daily., Disp: 180 tablet, Rfl: 1   Azelastine -Fluticasone  137-50 MCG/ACT SUSP, Place 2 sprays into the nose every 12 (twelve) hours., Disp: 23 g, Rfl: 5   buPROPion  (WELLBUTRIN  XL) 150 MG 24 hr tablet, Take 1 tablet (150 mg total) by mouth daily. For smoking cessation., Disp: 90 tablet, Rfl: 1   cetirizine  (ZYRTEC ) 10 MG tablet, Take 1 tablet (10 mg total) by mouth daily., Disp: 90 tablet, Rfl: 1   empagliflozin  (JARDIANCE ) 10 MG TABS tablet, Take 1 tablet (10 mg total) by mouth daily before breakfast., Disp: 90 tablet, Rfl: 3   Evolocumab  140 MG/ML SOAJ, Inject 140 mg into the skin every 14 (fourteen) days., Disp: 6 mL, Rfl: 3   ezetimibe  (ZETIA ) 10 MG tablet, Take 1 tablet (10 mg total) by mouth daily., Disp: 90 tablet, Rfl: 3   Fluticasone -Umeclidin-Vilant (TRELEGY ELLIPTA ) 100-62.5-25 MCG/ACT AEPB, Inhale 1 puff into the lungs daily., Disp: 60 each, Rfl: 6   furosemide  (LASIX ) 40 MG tablet, Take 1 tablet (40 mg total) by mouth daily as needed (May take 40mg  Daily for Swelling and Shortness of breath)., Disp: 90 tablet, Rfl: 1   gabapentin  (NEURONTIN ) 100 MG capsule, Take 1 capsule (100 mg total) by mouth at bedtime., Disp: 90 capsule, Rfl: 1   isosorbide  mononitrate (IMDUR ) 30 MG 24 hr tablet, Take 1 tablet (30 mg total) by mouth daily., Disp: 90 tablet, Rfl: 1   losartan  (COZAAR ) 25 MG tablet, Take 1 tablet (25 mg total) by mouth daily., Disp: 90 tablet, Rfl: 3   methocarbamol  (ROBAXIN ) 500 MG tablet, Take 1 tablet (500 mg total) by mouth every 8 (eight) hours as needed for muscle spasms. NEEDS MAILED, Disp: 60 tablet, Rfl: 0   metoprolol  succinate (TOPROL -XL) 25 MG 24 hr tablet, Take 1 tablet (25 mg total) by mouth daily., Disp: 90 tablet, Rfl: 1   Misc. Devices MISC, Rolling walker with seat.  Diagnosis-recurrent falls, Disp: 1 each, Rfl:  0   montelukast  (SINGULAIR ) 10 MG tablet, Take 1 tablet (10 mg total) by mouth at bedtime., Disp: 90 tablet, Rfl: 1   nitroGLYCERIN  (NITROSTAT ) 0.4 MG SL tablet, Place 1 tablet (0.4 mg total) under the tongue every 5 (five) minutes as needed for chest pain., Disp: 25 tablet, Rfl: 3   pantoprazole  (PROTONIX ) 40 MG tablet, Take 1 tablet (40 mg total) by mouth daily., Disp: 90 tablet, Rfl: 0   potassium chloride  SA (KLOR-CON  M) 20 MEQ tablet, Take 1 tablet (20 mEq total) by mouth daily., Disp: 5 tablet, Rfl: 0 [2]  Social History Tobacco Use  Smoking Status Every Day   Current packs/day: 1.00   Average packs/day: 1 pack/day for 35.0 years (35.0 ttl pk-yrs)   Types: Cigarettes   Passive exposure: Never  Smokeless Tobacco Never  Tobacco Comments   Up  to 5 cigarettes a day.   [3]  Allergies Allergen Reactions   Atorvastatin      myalgia   "

## 2024-05-02 ENCOUNTER — Ambulatory Visit: Admitting: Podiatry

## 2024-05-09 ENCOUNTER — Ambulatory Visit: Admitting: Podiatry

## 2024-05-23 ENCOUNTER — Ambulatory Visit: Admitting: Podiatry

## 2024-06-05 ENCOUNTER — Ambulatory Visit: Admitting: Neurology
